# Patient Record
Sex: Female | Born: 1945 | Race: White | Hispanic: No
Health system: Southern US, Community
[De-identification: ages and names within clinical notes are randomized; demographics above are authoritative.]

## PROBLEM LIST (undated history)

## (undated) DIAGNOSIS — F419 Anxiety disorder, unspecified: Secondary | ICD-10-CM

## (undated) DIAGNOSIS — I10 Essential (primary) hypertension: Secondary | ICD-10-CM

## (undated) DIAGNOSIS — M359 Systemic involvement of connective tissue, unspecified: Secondary | ICD-10-CM

## (undated) DIAGNOSIS — M545 Low back pain, unspecified: Secondary | ICD-10-CM

## (undated) DIAGNOSIS — I219 Acute myocardial infarction, unspecified: Secondary | ICD-10-CM

## (undated) DIAGNOSIS — M549 Dorsalgia, unspecified: Secondary | ICD-10-CM

## (undated) DIAGNOSIS — I639 Cerebral infarction, unspecified: Secondary | ICD-10-CM

## (undated) DIAGNOSIS — E119 Type 2 diabetes mellitus without complications: Secondary | ICD-10-CM

## (undated) DIAGNOSIS — I2510 Atherosclerotic heart disease of native coronary artery without angina pectoris: Secondary | ICD-10-CM

## (undated) DIAGNOSIS — M869 Osteomyelitis, unspecified: Secondary | ICD-10-CM

## (undated) DIAGNOSIS — G8929 Other chronic pain: Secondary | ICD-10-CM

## (undated) HISTORY — PX: TOE AMPUTATION: SHX809

## (undated) HISTORY — PX: JOINT REPLACEMENT: SHX530

---

## 2012-05-18 DIAGNOSIS — L02619 Cutaneous abscess of unspecified foot: Secondary | ICD-10-CM | POA: Insufficient documentation

## 2014-01-17 DIAGNOSIS — K625 Hemorrhage of anus and rectum: Secondary | ICD-10-CM | POA: Insufficient documentation

## 2014-05-07 DIAGNOSIS — E1140 Type 2 diabetes mellitus with diabetic neuropathy, unspecified: Secondary | ICD-10-CM | POA: Insufficient documentation

## 2014-05-18 DIAGNOSIS — G894 Chronic pain syndrome: Secondary | ICD-10-CM | POA: Insufficient documentation

## 2014-05-18 DIAGNOSIS — R634 Abnormal weight loss: Secondary | ICD-10-CM | POA: Insufficient documentation

## 2014-05-18 DIAGNOSIS — K5120 Ulcerative (chronic) proctitis without complications: Secondary | ICD-10-CM | POA: Insufficient documentation

## 2014-05-18 DIAGNOSIS — E1142 Type 2 diabetes mellitus with diabetic polyneuropathy: Secondary | ICD-10-CM | POA: Insufficient documentation

## 2014-07-19 DIAGNOSIS — M50321 Other cervical disc degeneration at C4-C5 level: Secondary | ICD-10-CM | POA: Insufficient documentation

## 2014-12-07 DIAGNOSIS — I252 Old myocardial infarction: Secondary | ICD-10-CM | POA: Insufficient documentation

## 2014-12-15 ENCOUNTER — Emergency Department: Payer: Self-pay | Admitting: Emergency Medicine

## 2014-12-15 LAB — URINALYSIS, COMPLETE
BILIRUBIN, UR: NEGATIVE
Bacteria: NONE SEEN
Blood: NEGATIVE
GLUCOSE, UR: NEGATIVE mg/dL (ref 0–75)
KETONE: NEGATIVE
NITRITE: NEGATIVE
PROTEIN: NEGATIVE
Ph: 5 (ref 4.5–8.0)
RBC,UR: NONE SEEN /HPF (ref 0–5)
Specific Gravity: 1.014 (ref 1.003–1.030)
WBC UR: 3 /HPF (ref 0–5)

## 2014-12-15 LAB — BASIC METABOLIC PANEL
Anion Gap: 7 (ref 7–16)
BUN: 19 mg/dL — ABNORMAL HIGH (ref 7–18)
CALCIUM: 8.4 mg/dL — AB (ref 8.5–10.1)
CHLORIDE: 109 mmol/L — AB (ref 98–107)
CREATININE: 0.67 mg/dL (ref 0.60–1.30)
Co2: 26 mmol/L (ref 21–32)
EGFR (Non-African Amer.): >60
Glucose: 181 mg/dL — ABNORMAL HIGH (ref 65–99)
Osmolality: 290 (ref 275–301)
Potassium: 4 mmol/L (ref 3.5–5.1)
Sodium: 142 mmol/L (ref 136–145)

## 2014-12-15 LAB — CBC
HCT: 37 % (ref 35.0–47.0)
HGB: 11.8 g/dL — AB (ref 12.0–16.0)
MCH: 28.9 pg (ref 26.0–34.0)
MCHC: 32 g/dL (ref 32.0–36.0)
MCV: 91 fL (ref 80–100)
Platelet: 269 10*3/uL (ref 150–440)
RBC: 4.09 10*6/uL (ref 3.80–5.20)
RDW: 17 % — ABNORMAL HIGH (ref 11.5–14.5)
WBC: 6.4 10*3/uL (ref 3.6–11.0)

## 2014-12-15 LAB — TROPONIN I: Troponin-I: <0.02 ng/mL

## 2015-06-04 DIAGNOSIS — Z9889 Other specified postprocedural states: Secondary | ICD-10-CM | POA: Insufficient documentation

## 2015-06-14 DIAGNOSIS — M5440 Lumbago with sciatica, unspecified side: Secondary | ICD-10-CM | POA: Insufficient documentation

## 2015-06-14 DIAGNOSIS — F411 Generalized anxiety disorder: Secondary | ICD-10-CM | POA: Insufficient documentation

## 2015-06-14 DIAGNOSIS — F331 Major depressive disorder, recurrent, moderate: Secondary | ICD-10-CM | POA: Insufficient documentation

## 2015-06-14 DIAGNOSIS — D500 Iron deficiency anemia secondary to blood loss (chronic): Secondary | ICD-10-CM | POA: Insufficient documentation

## 2015-10-02 ENCOUNTER — Ambulatory Visit: Payer: Medicare Other

## 2015-10-14 ENCOUNTER — Encounter: Payer: Medicare Other | Admitting: Pharmacist

## 2015-11-07 ENCOUNTER — Inpatient Hospital Stay
Admission: AD | Admit: 2015-11-07 | Discharge: 2015-11-12 | DRG: 617 | Disposition: A | Discharge status: Skilled Nursing Facility | Payer: Medicare Other | Source: Ambulatory Visit | Attending: Internal Medicine | Admitting: Internal Medicine

## 2015-11-07 ENCOUNTER — Encounter: Payer: Self-pay | Admitting: Internal Medicine

## 2015-11-07 ENCOUNTER — Inpatient Hospital Stay: Payer: Medicare Other

## 2015-11-07 DIAGNOSIS — Z794 Long term (current) use of insulin: Secondary | ICD-10-CM | POA: Diagnosis not present

## 2015-11-07 DIAGNOSIS — I10 Essential (primary) hypertension: Secondary | ICD-10-CM | POA: Diagnosis present

## 2015-11-07 DIAGNOSIS — B9562 Methicillin resistant Staphylococcus aureus infection as the cause of diseases classified elsewhere: Secondary | ICD-10-CM | POA: Diagnosis present

## 2015-11-07 DIAGNOSIS — F329 Major depressive disorder, single episode, unspecified: Secondary | ICD-10-CM | POA: Diagnosis present

## 2015-11-07 DIAGNOSIS — E1140 Type 2 diabetes mellitus with diabetic neuropathy, unspecified: Secondary | ICD-10-CM | POA: Diagnosis present

## 2015-11-07 DIAGNOSIS — Z8673 Personal history of transient ischemic attack (TIA), and cerebral infarction without residual deficits: Secondary | ICD-10-CM

## 2015-11-07 DIAGNOSIS — M549 Dorsalgia, unspecified: Secondary | ICD-10-CM

## 2015-11-07 DIAGNOSIS — K219 Gastro-esophageal reflux disease without esophagitis: Secondary | ICD-10-CM | POA: Diagnosis present

## 2015-11-07 DIAGNOSIS — L97529 Non-pressure chronic ulcer of other part of left foot with unspecified severity: Secondary | ICD-10-CM | POA: Diagnosis present

## 2015-11-07 DIAGNOSIS — I25119 Atherosclerotic heart disease of native coronary artery with unspecified angina pectoris: Secondary | ICD-10-CM | POA: Diagnosis present

## 2015-11-07 DIAGNOSIS — Z885 Allergy status to narcotic agent status: Secondary | ICD-10-CM | POA: Diagnosis not present

## 2015-11-07 DIAGNOSIS — Z66 Do not resuscitate: Secondary | ICD-10-CM | POA: Diagnosis present

## 2015-11-07 DIAGNOSIS — Z7982 Long term (current) use of aspirin: Secondary | ICD-10-CM | POA: Diagnosis not present

## 2015-11-07 DIAGNOSIS — Z887 Allergy status to serum and vaccine status: Secondary | ICD-10-CM | POA: Diagnosis not present

## 2015-11-07 DIAGNOSIS — M869 Osteomyelitis, unspecified: Secondary | ICD-10-CM | POA: Diagnosis present

## 2015-11-07 DIAGNOSIS — I2089 Other forms of angina pectoris: Secondary | ICD-10-CM

## 2015-11-07 DIAGNOSIS — I208 Other forms of angina pectoris: Secondary | ICD-10-CM

## 2015-11-07 DIAGNOSIS — I252 Old myocardial infarction: Secondary | ICD-10-CM

## 2015-11-07 DIAGNOSIS — M868X7 Other osteomyelitis, ankle and foot: Secondary | ICD-10-CM | POA: Diagnosis present

## 2015-11-07 DIAGNOSIS — E1169 Type 2 diabetes mellitus with other specified complication: Secondary | ICD-10-CM | POA: Diagnosis present

## 2015-11-07 DIAGNOSIS — Z79899 Other long term (current) drug therapy: Secondary | ICD-10-CM | POA: Diagnosis not present

## 2015-11-07 DIAGNOSIS — G8929 Other chronic pain: Secondary | ICD-10-CM | POA: Diagnosis present

## 2015-11-07 DIAGNOSIS — Z888 Allergy status to other drugs, medicaments and biological substances status: Secondary | ICD-10-CM

## 2015-11-07 DIAGNOSIS — E785 Hyperlipidemia, unspecified: Secondary | ICD-10-CM | POA: Diagnosis present

## 2015-11-07 DIAGNOSIS — M545 Low back pain: Secondary | ICD-10-CM | POA: Diagnosis present

## 2015-11-07 HISTORY — DX: Low back pain, unspecified: M54.50

## 2015-11-07 HISTORY — DX: Essential (primary) hypertension: I10

## 2015-11-07 HISTORY — DX: Cerebral infarction, unspecified: I63.9

## 2015-11-07 HISTORY — DX: Other chronic pain: G89.29

## 2015-11-07 HISTORY — DX: Type 2 diabetes mellitus without complications: E11.9

## 2015-11-07 HISTORY — DX: Atherosclerotic heart disease of native coronary artery without angina pectoris: I25.10

## 2015-11-07 HISTORY — DX: Acute myocardial infarction, unspecified: I21.9

## 2015-11-07 HISTORY — DX: Dorsalgia, unspecified: M54.9

## 2015-11-07 HISTORY — DX: Low back pain: M54.5

## 2015-11-07 LAB — CBC
HCT: 37 % (ref 35.0–47.0)
Hemoglobin: 12.3 g/dL (ref 12.0–16.0)
MCH: 31.2 pg (ref 26.0–34.0)
MCHC: 33.4 g/dL (ref 32.0–36.0)
MCV: 93.5 fL (ref 80.0–100.0)
PLATELETS: 284 10*3/uL (ref 150–440)
RBC: 3.96 MIL/uL (ref 3.80–5.20)
RDW: 13.5 % (ref 11.5–14.5)
WBC: 6.7 10*3/uL (ref 3.6–11.0)

## 2015-11-07 LAB — GLUCOSE, CAPILLARY
GLUCOSE-CAPILLARY: 153 mg/dL — AB (ref 65–99)
Glucose-Capillary: 92 mg/dL (ref 65–99)

## 2015-11-07 LAB — COMPREHENSIVE METABOLIC PANEL
ALBUMIN: 3.9 g/dL (ref 3.5–5.0)
ALT: 19 U/L (ref 14–54)
ANION GAP: 6 (ref 5–15)
AST: 19 U/L (ref 15–41)
Alkaline Phosphatase: 97 U/L (ref 38–126)
BILIRUBIN TOTAL: 0.5 mg/dL (ref 0.3–1.2)
BUN: 22 mg/dL — ABNORMAL HIGH (ref 6–20)
CALCIUM: 9.5 mg/dL (ref 8.9–10.3)
CO2: 30 mmol/L (ref 22–32)
CREATININE: 0.68 mg/dL (ref 0.44–1.00)
Chloride: 102 mmol/L (ref 101–111)
GFR calc non Af Amer: >60 mL/min (ref >60)
GLUCOSE: 115 mg/dL — AB (ref 65–99)
Potassium: 4.2 mmol/L (ref 3.5–5.1)
SODIUM: 138 mmol/L (ref 135–145)
TOTAL PROTEIN: 7 g/dL (ref 6.5–8.1)

## 2015-11-07 LAB — SURGICAL PCR SCREEN
MRSA, PCR: POSITIVE — AB
Staphylococcus aureus: POSITIVE — AB

## 2015-11-07 MED ORDER — TIZANIDINE HCL 4 MG PO TABS
4.0000 mg | ORAL_TABLET | Freq: Three times a day (TID) | ORAL | Status: DC
Start: 2015-11-07 — End: 2015-11-12
  Administered 2015-11-07 – 2015-11-12 (×12): 4 mg via ORAL
  Filled 2015-11-07 (×12): qty 1

## 2015-11-07 MED ORDER — TRAZODONE HCL 100 MG PO TABS
100.0000 mg | ORAL_TABLET | Freq: Every evening | ORAL | Status: DC | PRN
  Administered 2015-11-07 – 2015-11-10 (×4): 100 mg via ORAL
  Filled 2015-11-07 (×4): qty 1

## 2015-11-07 MED ORDER — ISOSORBIDE MONONITRATE ER 30 MG PO TB24
30.0000 mg | ORAL_TABLET | Freq: Every day | ORAL | Status: DC
  Administered 2015-11-08 – 2015-11-12 (×4): 30 mg via ORAL
  Filled 2015-11-07 (×4): qty 1

## 2015-11-07 MED ORDER — MIRTAZAPINE 15 MG PO TABS
15.0000 mg | ORAL_TABLET | Freq: Every day | ORAL | Status: DC
  Administered 2015-11-07 – 2015-11-11 (×5): 15 mg via ORAL
  Filled 2015-11-07 (×5): qty 1

## 2015-11-07 MED ORDER — FESOTERODINE FUMARATE ER 4 MG PO TB24
4.0000 mg | ORAL_TABLET | Freq: Every day | ORAL | Status: DC
  Administered 2015-11-08 – 2015-11-12 (×5): 4 mg via ORAL
  Filled 2015-11-07 (×5): qty 1

## 2015-11-07 MED ORDER — FUROSEMIDE 20 MG PO TABS
20.0000 mg | ORAL_TABLET | Freq: Every day | ORAL | Status: DC
  Administered 2015-11-08 – 2015-11-12 (×4): 20 mg via ORAL
  Filled 2015-11-07 (×4): qty 1

## 2015-11-07 MED ORDER — INSULIN GLARGINE 100 UNIT/ML ~~LOC~~ SOLN
20.0000 [IU] | Freq: Every day | SUBCUTANEOUS | Status: DC
  Administered 2015-11-07 – 2015-11-11 (×5): 20 [IU] via SUBCUTANEOUS
  Filled 2015-11-07 (×6): qty 0.2

## 2015-11-07 MED ORDER — PANTOPRAZOLE SODIUM 40 MG PO TBEC
40.0000 mg | DELAYED_RELEASE_TABLET | Freq: Every day | ORAL | Status: DC
  Administered 2015-11-07 – 2015-11-12 (×5): 40 mg via ORAL
  Filled 2015-11-07 (×5): qty 1

## 2015-11-07 MED ORDER — ALPRAZOLAM 0.5 MG PO TABS
1.0000 mg | ORAL_TABLET | Freq: Three times a day (TID) | ORAL | Status: DC | PRN
Start: 2015-11-07 — End: 2015-11-12
  Administered 2015-11-08 – 2015-11-11 (×5): 1 mg via ORAL
  Administered 2015-11-11: 0.5 mg via ORAL
  Administered 2015-11-12 (×2): 1 mg via ORAL
  Filled 2015-11-07 (×3): qty 2
  Filled 2015-11-07: qty 1
  Filled 2015-11-07 (×5): qty 2

## 2015-11-07 MED ORDER — NITROGLYCERIN 0.4 MG SL SUBL: 0.4000 mg | SUBLINGUAL_TABLET | SUBLINGUAL | Status: DC | PRN

## 2015-11-07 MED ORDER — INSULIN ASPART 100 UNIT/ML ~~LOC~~ SOLN
0.0000 [IU] | Freq: Three times a day (TID) | SUBCUTANEOUS | Status: DC
  Administered 2015-11-08: 1 [IU] via SUBCUTANEOUS
  Administered 2015-11-09: 2 [IU] via SUBCUTANEOUS
  Administered 2015-11-11: 1 [IU] via SUBCUTANEOUS
  Filled 2015-11-07 (×2): qty 1
  Filled 2015-11-07: qty 2

## 2015-11-07 MED ORDER — MORPHINE SULFATE 15 MG PO TABS
15.0000 mg | ORAL_TABLET | Freq: Four times a day (QID) | ORAL | Status: DC
  Administered 2015-11-07 – 2015-11-12 (×19): 15 mg via ORAL
  Filled 2015-11-07 (×20): qty 1

## 2015-11-07 MED ORDER — ATORVASTATIN CALCIUM 20 MG PO TABS
20.0000 mg | ORAL_TABLET | Freq: Every day | ORAL | Status: DC
  Administered 2015-11-07 – 2015-11-12 (×6): 20 mg via ORAL
  Filled 2015-11-07 (×6): qty 1

## 2015-11-07 MED ORDER — HEPARIN SODIUM (PORCINE) 5000 UNIT/ML IJ SOLN
5000.0000 [IU] | Freq: Three times a day (TID) | INTRAMUSCULAR | Status: DC
  Administered 2015-11-07 – 2015-11-09 (×5): 5000 [IU] via SUBCUTANEOUS
  Filled 2015-11-07 (×5): qty 1

## 2015-11-07 MED ORDER — MORPHINE SULFATE ER 30 MG PO TBCR
30.0000 mg | EXTENDED_RELEASE_TABLET | Freq: Three times a day (TID) | ORAL | Status: DC
  Administered 2015-11-07 – 2015-11-12 (×15): 30 mg via ORAL
  Filled 2015-11-07 (×15): qty 1

## 2015-11-07 MED ORDER — DIPHENOXYLATE-ATROPINE 2.5-0.025 MG PO TABS: 1.0000 | ORAL_TABLET | Freq: Four times a day (QID) | ORAL | Status: DC | PRN

## 2015-11-07 MED ORDER — METOPROLOL TARTRATE 50 MG PO TABS
50.0000 mg | ORAL_TABLET | Freq: Two times a day (BID) | ORAL | Status: DC
  Administered 2015-11-07 – 2015-11-11 (×7): 50 mg via ORAL
  Filled 2015-11-07 (×7): qty 1

## 2015-11-07 MED ORDER — SODIUM CHLORIDE 0.9 % IV SOLN
1.5000 g | Freq: Three times a day (TID) | INTRAVENOUS | Status: DC
  Administered 2015-11-07 – 2015-11-08 (×3): 1.5 g via INTRAVENOUS
  Filled 2015-11-07 (×5): qty 1.5

## 2015-11-07 MED ORDER — FERROUS SULFATE 325 (65 FE) MG PO TABS
325.0000 mg | ORAL_TABLET | Freq: Every day | ORAL | Status: DC
  Administered 2015-11-08 – 2015-11-12 (×4): 325 mg via ORAL
  Filled 2015-11-07 (×4): qty 1

## 2015-11-07 MED ORDER — LISINOPRIL 20 MG PO TABS
20.0000 mg | ORAL_TABLET | Freq: Every day | ORAL | Status: DC
  Administered 2015-11-08: 20 mg via ORAL
  Filled 2015-11-07 (×2): qty 1

## 2015-11-07 MED ORDER — GABAPENTIN 100 MG PO CAPS
100.0000 mg | ORAL_CAPSULE | Freq: Two times a day (BID) | ORAL | Status: DC
  Administered 2015-11-07 – 2015-11-12 (×10): 100 mg via ORAL
  Filled 2015-11-07 (×10): qty 1

## 2015-11-07 MED ORDER — AMLODIPINE BESYLATE 5 MG PO TABS
5.0000 mg | ORAL_TABLET | Freq: Every day | ORAL | Status: DC
  Administered 2015-11-08: 5 mg via ORAL
  Filled 2015-11-07 (×2): qty 1

## 2015-11-07 MED ORDER — VENLAFAXINE HCL ER 75 MG PO CP24
150.0000 mg | ORAL_CAPSULE | Freq: Every day | ORAL | Status: DC
  Administered 2015-11-08 – 2015-11-12 (×5): 150 mg via ORAL
  Filled 2015-11-07 (×5): qty 2

## 2015-11-07 MED ORDER — OXYCODONE-ACETAMINOPHEN 5-325 MG PO TABS
1.0000 | ORAL_TABLET | ORAL | Status: DC | PRN
  Administered 2015-11-11: 1 via ORAL
  Filled 2015-11-07 (×3): qty 1

## 2015-11-07 MED ORDER — PREGABALIN 50 MG PO CAPS
50.0000 mg | ORAL_CAPSULE | Freq: Two times a day (BID) | ORAL | Status: DC
  Administered 2015-11-07 – 2015-11-12 (×10): 50 mg via ORAL
  Filled 2015-11-07 (×10): qty 1

## 2015-11-07 NOTE — Progress Notes (Signed)
Patient admitted to 145 this afternoon. Dr Alberteen Spindleline will be doing surgery 11/19 or 11/20.  Pt said that the dr mentioned doing a CT of her head.  I asked her why and she said she felt like she may have had a small stroke.  Dr Elisabeth PigeonVachhani did not want to order a head CT

## 2015-11-07 NOTE — H&P (Signed)
[facility]   PATIENT NAME: Jessica Donovan Dykman    MR#:  [id number]  DATE OF BIRTH:  1946/11/19  DATE OF ADMISSION:  11/07/2015  PRIMARY CARE PHYSICIAN: Geoffery Lyonsurner, Eric M., PA   REQUESTING/REFERRING PHYSICIAN: Dr, Marikay AlarKlien- Podiatrist.  CHIEF COMPLAINT:  No chief complaint on file.   HISTORY OF PRESENT ILLNESS: Jessica Donovan Fecher  is a 69 y.o. female with a known history of hypertension, diabetes, coronary artery disease, stroke, chronic back pain- has been having redness and swelling with some pain on her left side third toe, she has been trying to do dressing him take some pain medications at home on her own but it was not resolving. Finally yesterday C spoke to her primary care doctor Dareen Pianonderson about that, he referred her to podiatry Dr. Odessa FlemingKlein's office. Today patient went over there, and Dr. Alberteen Spindleline suspected she has osteomyelitis and she need amputation of the toe but because of her cardiac history and being on some antiplatelet agents she needed to be off from those and started on antibiotics in hospital- so sent her for direct admission.  On further questioning patient also told me in hospital that for last few weeks to month she has been having chest pain on and off in center of her chest with even minimal activity like getting up to change the radio station or to pick up something in the house, but she does not have the pain when she is just lying down or sitting. She also went to Dr. Darrold JunkerParaschos office yesterday- and he told her we will do a cardiac catheterization as outpatient within next few days so he was working on that.  PAST MEDICAL HISTORY:   Past Medical History  Diagnosis Date  . Hypertension   . Diabetes mellitus without complication (HCC)   . Coronary artery disease   . Low back pain   . Stroke (HCC)   . Myocardial infarction (HCC)   . Chronic back pain     PAST SURGICAL HISTORY:  Past Surgical History  Procedure Laterality Date  .  Toe amputation      SOCIAL HISTORY:  Social History  Substance Use Topics  . Smoking status: Never Smoker   . Smokeless tobacco: Not on file  . Alcohol Use: Not on file    FAMILY HISTORY:  Family History  Problem Relation Age of Onset  . CAD Mother   . CAD Father     DRUG ALLERGIES:  Allergies  Allergen Reactions  . Codeine Nausea And Vomiting  . Influenza Vaccines Other (See Comments)    Passed out  . Methadone Hives  . Percocet [Oxycodone-Acetaminophen] Nausea And Vomiting  . Tetanus Toxoids Swelling  . Tetracyclines & Related Rash    REVIEW OF SYSTEMS:   CONSTITUTIONAL: No fever, fatigue or weakness.  EYES: No blurred or double vision.  EARS, NOSE, AND THROAT: No tinnitus or ear pain.  RESPIRATORY: No cough, shortness of breath, wheezing or hemoptysis.  CARDIOVASCULAR: Positive for anginal chest pain, no orthopnea, edema.  GASTROINTESTINAL: No nausea, vomiting, diarrhea or abdominal pain.  GENITOURINARY: No dysuria, hematuria.  ENDOCRINE: No polyuria, nocturia,  HEMATOLOGY: No anemia, easy bruising or bleeding SKIN: No rash or lesion. MUSCULOSKELETAL: No joint pain or arthritis. On left third toe redness and some foul smelling and severe pain present   NEUROLOGIC: No tingling, numbness, weakness.  PSYCHIATRY: No anxiety or depression.   MEDICATIONS AT HOME:  Prior to Admission medications   Medication Sig Start Date  End Date Taking? Authorizing Provider  ALPRAZolam Prudy Feeler) 1 MG tablet Take 1 mg by mouth 3 (three) times daily as needed for anxiety.   Yes Historical Provider, MD  amLODipine (NORVASC) 5 MG tablet Take 5 mg by mouth.   Yes Historical Provider, MD  amoxicillin-clavulanate (AUGMENTIN) 875-125 MG tablet Take 1 tablet by mouth 2 (two) times daily.   Yes Historical Provider, MD  aspirin EC 81 MG tablet Take 81 mg by mouth daily.   Yes Historical Provider, MD  atorvastatin (LIPITOR) 20 MG tablet Take 20 mg by mouth daily.   Yes Historical Provider, MD   diphenoxylate-atropine (LOMOTIL) 2.5-0.025 MG tablet Take 1 tablet by mouth 4 (four) times daily as needed for diarrhea or loose stools.   Yes Historical Provider, MD  ergocalciferol (VITAMIN D2) 50000 UNITS capsule Take 50,000 Units by mouth once a week. 11/10/15  Yes Historical Provider, MD  ferrous sulfate 325 (65 FE) MG tablet Take 325 mg by mouth daily with breakfast.   Yes Historical Provider, MD  furosemide (LASIX) 20 MG tablet Take 20 mg by mouth daily.   Yes Historical Provider, MD  gabapentin (NEURONTIN) 100 MG capsule Take 100 mg by mouth.   Yes Historical Provider, MD  insulin aspart protamine- aspart (NOVOLOG MIX 70/30) (70-30) 100 UNIT/ML injection Inject into the skin 2 (two) times daily with a meal.   Yes Historical Provider, MD  insulin glargine (LANTUS) 100 UNIT/ML injection Inject 50 Units into the skin at bedtime.   Yes Historical Provider, MD  isosorbide mononitrate (IMDUR) 30 MG 24 hr tablet Take 30 mg by mouth daily.   Yes Historical Provider, MD  liothyronine (CYTOMEL) 25 MCG tablet Take 25 mcg by mouth daily.   Yes Historical Provider, MD  lisinopril (PRINIVIL,ZESTRIL) 20 MG tablet Take 20 mg by mouth daily.   Yes Historical Provider, MD  metoprolol (LOPRESSOR) 50 MG tablet Take 50 mg by mouth 2 (two) times daily.   Yes Historical Provider, MD  mirtazapine (REMERON) 15 MG tablet Take 15 mg by mouth at bedtime.   Yes Historical Provider, MD  morphine (MS CONTIN) 30 MG 12 hr tablet Take 30 mg by mouth 3 (three) times daily.   Yes Historical Provider, MD  morphine (MSIR) 15 MG tablet Take 15 mg by mouth 4 (four) times daily.   Yes Historical Provider, MD  nitrofurantoin (MACRODANTIN) 100 MG capsule Take 100 mg by mouth 2 (two) times daily.   Yes Historical Provider, MD  nitroGLYCERIN (NITROSTAT) 0.4 MG SL tablet Place 0.4 mg under the tongue every 5 (five) minutes as needed for chest pain.   Yes Historical Provider, MD  omeprazole (PRILOSEC) 20 MG capsule Take 20 mg by mouth  daily.   Yes Historical Provider, MD  pregabalin (LYRICA) 75 MG capsule Take 75 mg by mouth 2 (two) times daily.   Yes Historical Provider, MD  solifenacin (VESICARE) 10 MG tablet Take by mouth daily.   Yes Historical Provider, MD  ticagrelor (BRILINTA) 90 MG TABS tablet Take 90 mg by mouth 2 (two) times daily.   Yes Historical Provider, MD  tiZANidine (ZANAFLEX) 4 MG tablet Take 4 mg by mouth 3 (three) times daily.   Yes Historical Provider, MD  tolterodine (DETROL LA) 4 MG 24 hr capsule Take 4 mg by mouth daily.   Yes Historical Provider, MD  traZODone (DESYREL) 100 MG tablet Take 100 mg by mouth at bedtime as needed for sleep.   Yes Historical Provider, MD  venlafaxine XR (EFFEXOR-XR) 150  MG 24 hr capsule Take 150 mg by mouth daily with breakfast.   Yes Historical Provider, MD      PHYSICAL EXAMINATION:   VITAL SIGNS: Blood pressure 119/56, pulse 65, temperature 97.8 F (36.6 C), temperature source Oral, resp. rate 20, height 5\' 3"  (1.6 m), weight 79.606 kg (175 lb 8 oz), SpO2 98 %.  GENERAL:  69 y.o.-year-old patient lying in the bed with no acute distress.  EYES: Pupils equal, round, reactive to light and accommodation. No scleral icterus. Extraocular muscles intact.  HEENT: Head atraumatic, normocephalic. Oropharynx and nasopharynx clear.  NECK:  Supple, no jugular venous distention. No thyroid enlargement, no tenderness.  LUNGS: Normal breath sounds bilaterally, no wheezing, rales,rhonchi or crepitation. No use of accessory muscles of respiration.  CARDIOVASCULAR: S1, S2 normal. No murmurs, rubs, or gallops.  ABDOMEN: Soft, nontender, nondistended. Bowel sounds present. No organomegaly or mass.  EXTREMITIES: No pedal edema, cyanosis, or clubbing. Left foot third toe is very red and extremely tender to touch and the redness is extending on the dorsal aspect of left foot, pulses are good and palpable on both feet.  NEUROLOGIC: Cranial nerves II through XII are intact. Muscle strength 5/5  in all extremities. Sensation intact. Gait not checked.  PSYCHIATRIC: The patient is alert and oriented x 3.  SKIN: No obvious rash, lesion, or ulcer.   LABORATORY PANEL:   CBC  Recent Labs Lab 11/07/15 1510  WBC 6.7  HGB 12.3  HCT 37.0  PLT 284  MCV 93.5  MCH 31.2  MCHC 33.4  RDW 13.5   ------------------------------------------------------------------------------------------------------------------  Chemistries   Recent Labs Lab 11/07/15 1510  NA 138  K 4.2  CL 102  CO2 30  GLUCOSE 115*  BUN 22*  CREATININE 0.68  CALCIUM 9.5  AST 19  ALT 19  ALKPHOS 97  BILITOT 0.5   ------------------------------------------------------------------------------------------------------------------ estimated creatinine clearance is 66.3 mL/min (by C-G formula based on Cr of 0.68). ------------------------------------------------------------------------------------------------------------------ No results for input(s): TSH, T4TOTAL, T3FREE, THYROIDAB in the last 72 hours.  Invalid input(s): FREET3   Coagulation profile No results for input(s): INR, PROTIME in the last 168 hours. ------------------------------------------------------------------------------------------------------------------- No results for input(s): DDIMER in the last 72 hours. -------------------------------------------------------------------------------------------------------------------  Cardiac Enzymes No results for input(s): CKMB, TROPONINI, MYOGLOBIN in the last 168 hours.  Invalid input(s): CK ------------------------------------------------------------------------------------------------------------------ Invalid input(s): POCBNP  ---------------------------------------------------------------------------------------------------------------  Urinalysis    Component Value Date/Time   COLORURINE Yellow 12/15/2014 0958   APPEARANCEUR Hazy 12/15/2014 0958   LABSPEC 1.014 12/15/2014 0958    PHURINE 5.0 12/15/2014 0958   GLUCOSEU Negative 12/15/2014 0958   HGBUR Negative 12/15/2014 0958   BILIRUBINUR Negative 12/15/2014 0958   KETONESUR Negative 12/15/2014 0958   PROTEINUR Negative 12/15/2014 0958   NITRITE Negative 12/15/2014 0958   LEUKOCYTESUR 1+ 12/15/2014 0958     RADIOLOGY: Dg Foot 2 Views Left  11/07/2015  CLINICAL DATA:  Chronic left foot pain x3 months, 3rd tilt is red and swollen EXAM: LEFT FOOT - 2 VIEW COMPARISON:  None. FINDINGS: Suspected cortical irregularity/destructive changes involving the 3rd distal phalanx with overlying soft tissue swelling, worrisome for acute osteomyelitis. Moderate degenerative changes involving the 1st IP joint. Large plantar and posterior calcaneal enthesophytes. IMPRESSION: Cortical irregularity/destructive changes involving the 3rd distal phalanx, worrisome for acute osteomyelitis. Electronically Signed   By: Charline Bills M.D.   On: 11/07/2015 15:53    EKG: Orders placed or performed in visit on 12/15/14  . EKG 12-Lead    IMPRESSION AND PLAN:  * Osteomyelitis  of  left third toe.  We'll start on IV antibiotic empirically at this time.  As patient is on aspirin and the Lantana it will be for a few days before she can have amputation.  Call podiatry consult.  * Coronary artery disease with recurrent angina  She just visited her cardiologist yesterday for her angina and he was planning to schedule cardiac catheterization next week as outpatient.  I will call cardiology consult to help manage this issue and to coordinate with podiatry when to do amputation procedure, as she would need to be off from anticoagulation for the procedure.  * Diabetes  She is on very high-dose of insulin at home  I will give small dose of Lantus and insulin sliding scale coverage and we will help to change it if her sugar stays very high..  * Hypertension  Continue home medication  * Chronic pain  She is on long-acting and immediate release  morphine at home, will continue the same dose here in hospital.    All the records are reviewed and case discussed with ED provider. Management plans discussed with the patient, family and they are in agreement.  CODE STATUS:    Code Status Orders        Start     Ordered   11/07/15 1733  Do not attempt resuscitation (DNR)   Continuous    Question Answer Comment  In the event of cardiac or respiratory ARREST Do not call a "code blue"   In the event of cardiac or respiratory ARREST Do not perform Intubation, CPR, defibrillation or ACLS   In the event of cardiac or respiratory ARREST Use medication by any route, position, wound care, and other measures to relive pain and suffering. May use oxygen, suction and manual treatment of airway obstruction as needed for comfort.      11/07/15 1732    Advance Directive Documentation        Most Recent Value   Type of Advance Directive  Living will   Pre-existing out of facility DNR order (yellow form or pink MOST form)     "MOST" Form in Place?         TOTAL TIME TAKING CARE OF THIS PATIENT: 50 minutes.    Altamese Dilling M.D on 11/07/2015   Between 7am to 6pm - Pager - [phone]  After 6pm go to [email or web address] - password [email or web address] [facility] [address]  [facility]  Office  [phone]  CC: Primary care physician; Geoffery Lyons., PA   Note: This dictation was prepared with Dragon dictation along with smaller phrase technology. Any transcriptional errors that result from this process are unintentional.

## 2015-11-08 LAB — GLUCOSE, CAPILLARY
GLUCOSE-CAPILLARY: 89 mg/dL (ref 65–99)
Glucose-Capillary: 96 mg/dL (ref 65–99)
Glucose-Capillary: 138 mg/dL — ABNORMAL HIGH (ref 65–99)
Glucose-Capillary: 129 mg/dL — ABNORMAL HIGH (ref 65–99)

## 2015-11-08 LAB — BASIC METABOLIC PANEL
Anion gap: 6 (ref 5–15)
BUN: 18 mg/dL (ref 6–20)
CALCIUM: 9 mg/dL (ref 8.9–10.3)
CO2: 30 mmol/L (ref 22–32)
CREATININE: 0.73 mg/dL (ref 0.44–1.00)
Chloride: 104 mmol/L (ref 101–111)
GFR calc Af Amer: >60 mL/min (ref >60)
GLUCOSE: 97 mg/dL (ref 65–99)
Potassium: 4 mmol/L (ref 3.5–5.1)
Sodium: 140 mmol/L (ref 135–145)

## 2015-11-08 LAB — CBC
HCT: 34.5 % — ABNORMAL LOW (ref 35.0–47.0)
Hemoglobin: 11.4 g/dL — ABNORMAL LOW (ref 12.0–16.0)
MCH: 31.4 pg (ref 26.0–34.0)
MCHC: 33.2 g/dL (ref 32.0–36.0)
MCV: 94.5 fL (ref 80.0–100.0)
PLATELETS: 261 10*3/uL (ref 150–440)
RBC: 3.65 MIL/uL — ABNORMAL LOW (ref 3.80–5.20)
RDW: 13.6 % (ref 11.5–14.5)
WBC: 6.9 10*3/uL (ref 3.6–11.0)

## 2015-11-08 MED ORDER — VANCOMYCIN HCL 10 G IV SOLR
1250.0000 mg | Freq: Once | INTRAVENOUS | Status: AC
  Administered 2015-11-08: 1250 mg via INTRAVENOUS
  Filled 2015-11-08: qty 1250

## 2015-11-08 MED ORDER — VANCOMYCIN HCL IN DEXTROSE 750-5 MG/150ML-% IV SOLN
750.0000 mg | Freq: Two times a day (BID) | INTRAVENOUS | Status: DC
  Administered 2015-11-09 – 2015-11-10 (×4): 750 mg via INTRAVENOUS
  Filled 2015-11-08 (×4): qty 150

## 2015-11-08 MED ORDER — PIPERACILLIN-TAZOBACTAM 3.375 G IVPB
3.3750 g | Freq: Three times a day (TID) | INTRAVENOUS | Status: DC
  Administered 2015-11-08 – 2015-11-11 (×9): 3.375 g via INTRAVENOUS
  Filled 2015-11-08 (×11): qty 50

## 2015-11-08 NOTE — Consult Note (Signed)
Reason for Consult: Osteomyelitis of the left third toe Referring Physician: [facility]  Jessica Donovan is an 69 y.o. female.  HPI: The patient relates a several week history of a sore on the tip of her left third toe. She has been caring for this at home. Recently has started to have some increased redness and drainage from the toe. Relates pain with the toe even though she does have neuropathy which is not normal for her. She was recently seen by Dr. Ouida Sills outpatient who placed her on some Augmentin. X-rays did show osteomyelitis and decision was made to admit the patient for amputation of the toe.  Past Medical History  Diagnosis Date  . Hypertension   . Diabetes mellitus without complication ([place])   . Coronary artery disease   . Low back pain   . Stroke ([place])   . Myocardial infarction ([place])   . Chronic back pain     Past Surgical History  Procedure Laterality Date  . Toe amputation      Family History  Problem Relation Age of Onset  . CAD Mother   . CAD Father     Social History:  reports that she has never smoked. She does not have any smokeless tobacco history on file. Her alcohol and drug histories are not on file.  Allergies:  Allergies  Allergen Reactions  . Codeine Nausea And Vomiting  . Influenza Vaccines Other (See Comments)    Passed out  . Methadone Hives  . Percocet [Oxycodone-Acetaminophen] Nausea And Vomiting  . Tetanus Toxoids Swelling  . Tetracyclines & Related Rash    Medications: I have reviewed the patient's current medications.  Results for orders placed or performed during the hospital encounter of 11/07/15 (from the past 48 hour(s))  CBC     Status: None   Collection Time: 11/07/15  3:10 PM  Result Value Ref Range   WBC 6.7 3.6 - 11.0 K/uL   RBC 3.96 3.80 - 5.20 MIL/uL   Hemoglobin 12.3 12.0 - 16.0 g/dL   HCT 37.0 35.0 - 47.0 %   MCV 93.5 80.0 - 100.0 fL   MCH 31.2 26.0 - 34.0 pg   MCHC 33.4 32.0 - 36.0 g/dL   RDW  13.5 11.5 - 14.5 %   Platelets 284 150 - 440 K/uL  Comprehensive metabolic panel     Status: Abnormal   Collection Time: 11/07/15  3:10 PM  Result Value Ref Range   Sodium 138 135 - 145 mmol/L   Potassium 4.2 3.5 - 5.1 mmol/L   Chloride 102 101 - 111 mmol/L   CO2 30 22 - 32 mmol/L   Glucose, Bld 115 (H) 65 - 99 mg/dL   BUN 22 (H) 6 - 20 mg/dL   Creatinine, Ser 0.68 0.44 - 1.00 mg/dL   Calcium 9.5 8.9 - 10.3 mg/dL   Total Protein 7.0 6.5 - 8.1 g/dL   Albumin 3.9 3.5 - 5.0 g/dL   AST 19 15 - 41 U/L   ALT 19 14 - 54 U/L   Alkaline Phosphatase 97 38 - 126 U/L   Total Bilirubin 0.5 0.3 - 1.2 mg/dL   GFR calc non Af Amer >60 >60 mL/min   GFR calc Af Amer >60 >60 mL/min    Comment: (NOTE) The eGFR has been calculated using the CKD EPI equation. This calculation has not been validated in all clinical situations. eGFR's persistently <60 mL/min signify possible Chronic Kidney Disease.    Anion gap 6 5 -  15  Glucose, capillary     Status: None   Collection Time: 11/07/15  3:56 PM  Result Value Ref Range   Glucose-Capillary 92 65 - 99 mg/dL   Comment 1 Notify RN   Surgical pcr screen     Status: Abnormal   Collection Time: 11/07/15  6:44 PM  Result Value Ref Range   MRSA, PCR POSITIVE (A) NEGATIVE    Comment: CRITICAL RESULT CALLED TO, READ BACK BY AND VERIFIED WITH: MATT PAGE AT 2058 11/07/15 MLZ     Staphylococcus aureus POSITIVE (A) NEGATIVE    Comment:        The Xpert SA Assay (FDA approved for NASAL specimens in patients over 4 years of age), is one component of a comprehensive surveillance program.  Test performance has been validated by [facility] for patients greater than or equal to 52 year old. It is not intended to diagnose infection nor to guide or monitor treatment.   Glucose, capillary     Status: Abnormal   Collection Time: 11/07/15  8:58 PM  Result Value Ref Range   Glucose-Capillary 153 (H) 65 - 99 mg/dL   Comment 1 Notify RN   Basic metabolic panel      Status: None   Collection Time: 11/08/15  4:47 AM  Result Value Ref Range   Sodium 140 135 - 145 mmol/L   Potassium 4.0 3.5 - 5.1 mmol/L   Chloride 104 101 - 111 mmol/L   CO2 30 22 - 32 mmol/L   Glucose, Bld 97 65 - 99 mg/dL   BUN 18 6 - 20 mg/dL   Creatinine, Ser 0.73 0.44 - 1.00 mg/dL   Calcium 9.0 8.9 - 10.3 mg/dL   GFR calc non Af Amer >60 >60 mL/min   GFR calc Af Amer >60 >60 mL/min    Comment: (NOTE) The eGFR has been calculated using the CKD EPI equation. This calculation has not been validated in all clinical situations. eGFR's persistently <60 mL/min signify possible Chronic Kidney Disease.    Anion gap 6 5 - 15  CBC     Status: Abnormal   Collection Time: 11/08/15  4:47 AM  Result Value Ref Range   WBC 6.9 3.6 - 11.0 K/uL   RBC 3.65 (L) 3.80 - 5.20 MIL/uL   Hemoglobin 11.4 (L) 12.0 - 16.0 g/dL   HCT 34.5 (L) 35.0 - 47.0 %   MCV 94.5 80.0 - 100.0 fL   MCH 31.4 26.0 - 34.0 pg   MCHC 33.2 32.0 - 36.0 g/dL   RDW 13.6 11.5 - 14.5 %   Platelets 261 150 - 440 K/uL  Glucose, capillary     Status: None   Collection Time: 11/08/15  7:43 AM  Result Value Ref Range   Glucose-Capillary 96 65 - 99 mg/dL    Dg Foot 2 Views Left  11/07/2015  CLINICAL DATA:  Chronic left foot pain x3 months, 3rd tilt is red and swollen EXAM: LEFT FOOT - 2 VIEW COMPARISON:  None. FINDINGS: Suspected cortical irregularity/destructive changes involving the 3rd distal phalanx with overlying soft tissue swelling, worrisome for acute osteomyelitis. Moderate degenerative changes involving the 1st IP joint. Large plantar and posterior calcaneal enthesophytes. IMPRESSION: Cortical irregularity/destructive changes involving the 3rd distal phalanx, worrisome for acute osteomyelitis. Electronically Signed   By: Julian Hy M.D.   On: 11/07/2015 15:53    Review of Systems  Constitutional: Negative.   HENT: Negative.   Eyes: Negative.   Respiratory: Negative.   Cardiovascular:  Negative.    Gastrointestinal: Negative.   Genitourinary: Negative.   Musculoskeletal:       Some recent pain at the tip of her left third toe. Some chronic back pain  Skin:       Some redness and swelling with her left third toe. Has had some drainage from a sore on the tip of the toe  Neurological:       Relates numbness and paresthesias associated with her diabetes  Endo/Heme/Allergies: Negative.   Psychiatric/Behavioral: Negative.    Blood pressure 124/54, pulse 64, temperature 98 F (36.7 C), temperature source Oral, resp. rate 18, height '5\' 3"'  (1.6 m), weight 79.606 kg (175 lb 8 oz), SpO2 98 %. Physical Exam  Cardiovascular:  DP and PT pulses are palpable. Capillary filling time intact with exception of the left third toe  Musculoskeletal:  Adequate range of motion. Muscle testing is 5 over 5.  Neurological:  Loss of protective threshold monofilament wire distally in the toes. Proprioception is impaired  Skin:  The skin is warm dry and somewhat atrophic. Some bilateral edema. Significant erythema and edema in the left third toe with an ulceration at the distal tip    Assessment/Plan: Assessment: Osteomyelitis left third toe. Diabetes with associated neuropathy  Plan: A light dressing was applied on the left third toe to cover the ulceration. Discussed with the patient amputation of the left third toe and what this would involve. At this point we will have to confer with cardiology about timing since she is on some blood thinning medicine. Discussed risks and complications of the surgery. We will obtain informed consent and plan for surgery in the next day or 2. Nothing by mouth after midnight  Amberli Ruegg W. 11/08/2015, 8:16 AM

## 2015-11-08 NOTE — Progress Notes (Signed)
Inpatient Diabetes Program Recommendations  AACE/ADA: New Consensus Statement on Inpatient Glycemic Control (2015)  Target Ranges:  Prepandial:   less than 140 mg/dL      Peak postprandial:   less than 180 mg/dL (1-2 hours)      Critically ill patients:  140 - 180 mg/dL    Results for Mellody DanceGLASS, Malanie (MRN [id number]) as of 11/08/2015 11:02  Ref. Range 11/07/2015 15:56 11/07/2015 20:58 11/08/2015 07:43  Glucose-Capillary Latest Ref Range: 65-99 mg/dL 92 409153 (H) 96    Admit with: Osteomyelitis of toe  History: DM, CVA, HTN  Home DM Meds: Lantus 50 units QHS  Current Insulin Orders: Lantus 20 units QHS      Novolog Sensitive SSI (0-9 units) TID AC     -Note patient will need amputation of toe.  -Currently on reduced dose of Lantus (20 units QHS).  -CBGs well controlled so far.    --Will follow patient during hospitalization--  Ambrose FinlandJeannine Johnston Aquil Duhe RN, MSN, CDE Diabetes Coordinator Inpatient Glycemic Control Team Team Pager: [phone] (8a-5p)

## 2015-11-08 NOTE — Progress Notes (Signed)
[facility] - [place] at [facility]   PATIENT NAME: Jessica DanceBarbara Donovan    MR#:  [id number]  DATE OF BIRTH:  09/04/1946  SUBJECTIVE:  CHIEF COMPLAINT:  No chief complaint on file.  left foot pain.  REVIEW OF SYSTEMS:  CONSTITUTIONAL: No fever, fatigue or weakness.  EYES: No blurred or double vision.  EARS, NOSE, AND THROAT: No tinnitus or ear pain.  RESPIRATORY: No cough, shortness of breath, wheezing or hemoptysis.  CARDIOVASCULAR: No chest pain, orthopnea, edema.  GASTROINTESTINAL: No nausea, vomiting, diarrhea or abdominal pain.  GENITOURINARY: No dysuria, hematuria.  ENDOCRINE: No polyuria, nocturia,  HEMATOLOGY: No anemia, easy bruising or bleeding SKIN: No rash or lesion. MUSCULOSKELETAL: Left foot pain. NEUROLOGIC: No tingling, numbness, weakness.  PSYCHIATRY: No anxiety or depression.   DRUG ALLERGIES:   Allergies  Allergen Reactions  . Codeine Nausea And Vomiting  . Influenza Vaccines Other (See Comments)    Passed out  . Methadone Hives  . Percocet [Oxycodone-Acetaminophen] Nausea And Vomiting  . Tetanus Toxoids Swelling  . Tetracyclines & Related Rash    VITALS:  Blood pressure 124/54, pulse 64, temperature 98 F (36.7 C), temperature source Oral, resp. rate 18, height 5\' 3"  (1.6 m), weight 79.606 kg (175 lb 8 oz), SpO2 98 %.  PHYSICAL EXAMINATION:  GENERAL:  69 y.o.-year-old patient lying in the bed with no acute distress.  EYES: Pupils equal, round, reactive to light and accommodation. No scleral icterus. Extraocular muscles intact.  HEENT: Head atraumatic, normocephalic. Oropharynx and nasopharynx clear.  NECK:  Supple, no jugular venous distention. No thyroid enlargement, no tenderness.  LUNGS: Normal breath sounds bilaterally, no wheezing, rales,rhonchi or crepitation. No use of accessory muscles of respiration.  CARDIOVASCULAR: S1, S2 normal. No murmurs, rubs, or gallops.  ABDOMEN: Soft, nontender, nondistended. Bowel sounds  present. No organomegaly or mass.  EXTREMITIES: No pedal edema, cyanosis, or clubbing. Left 3rd toe in dressing with tenderness. NEUROLOGIC: Cranial nerves II through XII are intact. Muscle strength 5/5 in all extremities. Sensation intact. Gait not checked.  PSYCHIATRIC: The patient is alert and oriented x 3.  SKIN: No obvious rash, lesion, or ulcer.    LABORATORY PANEL:   CBC  Recent Labs Lab 11/08/15 0447  WBC 6.9  HGB 11.4*  HCT 34.5*  PLT 261   ------------------------------------------------------------------------------------------------------------------  Chemistries   Recent Labs Lab 11/07/15 1510 11/08/15 0447  NA 138 140  K 4.2 4.0  CL 102 104  CO2 30 30  GLUCOSE 115* 97  BUN 22* 18  CREATININE 0.68 0.73  CALCIUM 9.5 9.0  AST 19  --   ALT 19  --   ALKPHOS 97  --   BILITOT 0.5  --    ------------------------------------------------------------------------------------------------------------------  Cardiac Enzymes No results for input(s): TROPONINI in the last 168 hours. ------------------------------------------------------------------------------------------------------------------  RADIOLOGY:  Dg Foot 2 Views Left  11/07/2015  CLINICAL DATA:  Chronic left foot pain x3 months, 3rd tilt is red and swollen EXAM: LEFT FOOT - 2 VIEW COMPARISON:  None. FINDINGS: Suspected cortical irregularity/destructive changes involving the 3rd distal phalanx with overlying soft tissue swelling, worrisome for acute osteomyelitis. Moderate degenerative changes involving the 1st IP joint. Large plantar and posterior calcaneal enthesophytes. IMPRESSION: Cortical irregularity/destructive changes involving the 3rd distal phalanx, worrisome for acute osteomyelitis. Electronically Signed   By: Charline BillsSriyesh  Krishnan M.D.   On: 11/07/2015 15:53    EKG:   Orders placed or performed in visit on 12/15/14  . EKG 12-Lead    ASSESSMENT AND PLAN:   *  Osteomyelitis of left third toe.   Positive MRSA. Add vancomycin, discontinue Unasyn, start Zosyn, hold aspirin and Lantana, follow-up with Dr. Alberteen Spindle for possible amputation.  * Coronary artery disease with recurrent angina She was scheduled cardiac catheterization next week as outpatient. Dr. Juliann Pares will do cardiac catheterization next week after foot surgery.  * Diabetes She is on very high-dose of insulin at home Continue Lantus 20 units SQ HS and insulin sliding scale.  * Hypertension Continue Lopressor, Lasix, imdur and lisinopril.  * Chronic pain She is on long-acting and immediate release morphine at home, will continue the same dose here in hospital.     All the records are reviewed and case discussed with Care Management/Social Workerr. Management plans discussed with the patient, family and they are in agreement. Greater than 50% time was spent on coordination of care and face-to-face counseling. CODE STATUS: DO NOT RESUSCITATE  TOTAL TIME TAKING CARE OF THIS PATIENT: 38 minutes.   POSSIBLE D/C IN 3-4 DAYS, DEPENDING ON CLINICAL CONDITION.   Shaune Pollack M.D on 11/08/2015 at 1:41 PM  Between 7am to 6pm - Pager - [phone]  After 6pm go to [email or web address] - password [email or web address] [facility]  [place] [facility]  Office  [phone]  CC: Primary care physician; Geoffery Lyons., PA

## 2015-11-08 NOTE — Progress Notes (Signed)
Patient alert and oriented but forgetful. VSS. No complaints of pain at the moment. Dr. Juliann Paresallwood called and stated he will not be doing a cardiac cath today, and will possibly do it on Tuesday. Continue to monitor.

## 2015-11-08 NOTE — Care Management Note (Signed)
Case Management Note  Patient Details  Name: Jessica Donovan MRN: [id number] Date of Birth: [birth year]  Subjective/Objective:                  Met with patient to discuss discharge planning. She states at independent living [facility]. She has a Education officer, museum that assists her with transportation. She is from [place] originally. She is followed by Dr. Frazier Richards. She states she is not followed by endocrinologist although this might be beneficial. She is on insulin and affording it but states it is relatively expensive. She gets her Rx from [place].She has a working glucometer that she states she uses.   She has a walker and a cane but states she has not needed them. She is open to [facility] home health. She said she lives alone and will not be able to administer IV antibiotics in the home setting. She agrees to SNF if needed.   Action/Plan: I have notified [facility] Sharrie Rothman of patient admission. I have requested DM consult/endocrinology from RN. RNCM to continue to follow home health orders needed prior to discharge.   Expected Discharge Date:  11/11/15               Expected Discharge Plan:     In-House Referral:     Discharge planning Services  CM Consult  Post Acute Care Choice:  Durable Medical Equipment, Home Health Choice offered to:  Patient  DME Arranged:    DME Agency:     HH Arranged:  RN, PT, NA HH Agency:  [place]  Status of Service:  In process, will continue to follow  Medicare Important Message Given:    Date Medicare IM Given:    Medicare IM give by:    Date Additional Medicare IM Given:    Additional Medicare Important Message give by:     If discussed at [place] of Stay Meetings, dates discussed:    Additional Comments:  Marshell Garfinkel, RN 11/08/2015, 10:38 AM

## 2015-11-08 NOTE — Progress Notes (Signed)
ANTIBIOTIC CONSULT NOTE - INITIAL  Pharmacy Consult for vancomycin and piperacillin/tazobactam Indication: Osteomyelitis  Allergies  Allergen Reactions  . Codeine Nausea And Vomiting  . Influenza Vaccines Other (See Comments)    Passed out  . Methadone Hives  . Percocet [Oxycodone-Acetaminophen] Nausea And Vomiting  . Tetanus Toxoids Swelling  . Tetracyclines & Related Rash    Patient Measurements: Height:  (160 cm) Weight: 175 lb 8 oz (79.606 kg) IBW/kg (Calculated) : 52.4 Adjusted Body Weight: 63 kg  Vital Signs: Temp: 98 F (36.7 C) (11/18 0805) Temp Source: Oral (11/18 0805) BP: 124/54 mmHg (11/18 0805) Pulse Rate: 64 (11/18 0805) Intake/Output from previous day: 11/17 0701 - 11/18 0700 In: 580 [P.O.:480; IV Piggyback:100] Out: 500 [Urine:500] Intake/Output from this shift:    Labs:  Recent Labs  11/07/15 1510 11/08/15 0447  WBC 6.7 6.9  HGB 12.3 11.4*  PLT 284 261  CREATININE 0.68 0.73   Estimated Creatinine Clearance: 66.3 mL/min (by C-G formula based on Cr of 0.73). No results for input(s): VANCOTROUGH, VANCOPEAK, VANCORANDOM, GENTTROUGH, GENTPEAK, GENTRANDOM, TOBRATROUGH, TOBRAPEAK, TOBRARND, AMIKACINPEAK, AMIKACINTROU, AMIKACIN in the last 72 hours.   Microbiology: Recent Results (from the past 720 hour(s))  Surgical pcr screen     Status: Abnormal   Collection Time: 11/07/15  6:44 PM  Result Value Ref Range Status   MRSA, PCR POSITIVE (A) NEGATIVE Final    Comment: CRITICAL RESULT CALLED TO, READ BACK BY AND VERIFIED WITH: MATT PAGE AT 2058 11/07/15 MLZ     Staphylococcus aureus POSITIVE (A) NEGATIVE Final    Comment:        The Xpert SA Assay (FDA approved for NASAL specimens in patients over 68 years of age), is one component of a comprehensive surveillance program.  Test performance has been validated by [facility] for patients greater than or equal to 3 year old. It is not intended to diagnose infection nor to guide or monitor  treatment.     Medical History: Past Medical History  Diagnosis Date  . Hypertension   . Diabetes mellitus without complication (HCC)   . Coronary artery disease   . Low back pain   . Stroke (HCC)   . Myocardial infarction (HCC)   . Chronic back pain     Medications:  Anti-infectives    Start     Dose/Rate Route Frequency Ordered Stop   11/09/15 0300  vancomycin (VANCOCIN) IVPB 750 mg/150 ml premix     750 mg 150 mL/hr over 60 Minutes Intravenous Every 12 hours 11/08/15 1358     11/08/15 1500  vancomycin (VANCOCIN) 1,250 mg in sodium chloride 0.9 % 250 mL IVPB     1,250 mg 166.7 mL/hr over 90 Minutes Intravenous  Once 11/08/15 1355     11/08/15 1500  piperacillin-tazobactam (ZOSYN) IVPB 3.375 g     3.375 g 12.5 mL/hr over 240 Minutes Intravenous 3 times per day 11/08/15 1402     11/07/15 1730  ampicillin-sulbactam (UNASYN) 1.5 g in sodium chloride 0.9 % 50 mL IVPB  Status:  Discontinued     1.5 g 100 mL/hr over 30 Minutes Intravenous Every 8 hours 11/07/15 1512 11/08/15 1351     Assessment: Pharmacy consulted to dose vancomycin and piperacillin/tazobactam on this 69 year old female who was directly admitted from podiatrist office due to suspected osteomyelitis of the toe 2/2 lower extremity infection. Patient was initially started on ampicillin/sulbactam, but antibiotics were changed to piperacillin/tazobactam and vancomycin today.   Today is day #2  of antibiotics Patient will likely undergo amputation of infected toe, OM confirmed by xray   Kinetics: Adjusted wt = 63 kg; adjusted CrCl ~ 72 mL/min ke = 0.064 Half-life: 10.8 hours Vd = 44 L Cmin (calculated): ~15.5 mcg/mL  Goal of Therapy:  Vancomycin trough level 15-20 mcg/ml  Plan:  Measure antibiotic drug levels at steady state Follow up culture results  Gave vancomycin 1250 mg IV one time dose followed by vancomycin 750 mg IV q12h Vancomycin trough ordered for 11/20 at 1430, which is prior to 5th dose and should  represent steady state.   Piperacillin/tazobactam 3.375 g IV q8h EI based on renal function and indication  Pharmacy will continue to monitor, thank you for the consult.   Jodelle RedMary M Myleen Brailsford 11/08/2015,2:03 PM

## 2015-11-09 LAB — CREATININE, SERUM: Creatinine, Ser: 0.78 mg/dL (ref 0.44–1.00)

## 2015-11-09 LAB — GLUCOSE, CAPILLARY
GLUCOSE-CAPILLARY: 147 mg/dL — AB (ref 65–99)
Glucose-Capillary: 99 mg/dL (ref 65–99)
Glucose-Capillary: 160 mg/dL — ABNORMAL HIGH (ref 65–99)
Glucose-Capillary: 111 mg/dL — ABNORMAL HIGH (ref 65–99)

## 2015-11-09 MED ORDER — ENOXAPARIN SODIUM 40 MG/0.4ML ~~LOC~~ SOLN
40.0000 mg | SUBCUTANEOUS | Status: DC
  Administered 2015-11-09 – 2015-11-10 (×2): 40 mg via SUBCUTANEOUS
  Filled 2015-11-09 (×2): qty 0.4

## 2015-11-09 NOTE — Consult Note (Signed)
Reason for Consult: Preoperative assessment and angina Referring Physician: Podiatry Cardiologist Dr Saralyn Pilar  Jessica Donovan is an 69 y.o. female.  HPI: Patient complains of significant chest pain and angina as scheduled to be have cardiac catheter as an outpatient because of recent anginal symptoms she done reasonably well medically. Now the patient presents with osteomyelitis requiring surgery. Denies any chest pain todayshortness of breath not very active because of her toe. Patient denies any significant fever chills or sweats. Patient's had a history of hyperlipidemia. Patient has been controlled with beta blockers and Imdur. Patient's been placed on antibiotics and now is preop for total amputation. Patient states to feel reasonably well. Routine cardiac evaluation  Past Medical History  Diagnosis Date  . Hypertension   . Diabetes mellitus without complication ([place])   . Coronary artery disease   . Low back pain   . Stroke ([place])   . Myocardial infarction ([place])   . Chronic back pain     Past Surgical History  Procedure Laterality Date  . Toe amputation      Family History  Problem Relation Age of Onset  . CAD Mother   . CAD Father     Social History:  reports that she has never smoked. She does not have any smokeless tobacco history on file. Her alcohol and drug histories are not on file.  Allergies:  Allergies  Allergen Reactions  . Codeine Nausea And Vomiting  . Influenza Vaccines Other (See Comments)    Passed out  . Methadone Hives  . Percocet [Oxycodone-Acetaminophen] Nausea And Vomiting  . Tetanus Toxoids Swelling  . Tetracyclines & Related Rash    Medications: I have reviewed the patient's current medications.  Results for orders placed or performed during the hospital encounter of 11/07/15 (from the past 48 hour(s))  Glucose, capillary     Status: Abnormal   Collection Time: 11/07/15  8:58 PM  Result Value Ref Range   Glucose-Capillary 153 (H) 65 - 99  mg/dL   Comment 1 Notify RN   Basic metabolic panel     Status: None   Collection Time: 11/08/15  4:47 AM  Result Value Ref Range   Sodium 140 135 - 145 mmol/L   Potassium 4.0 3.5 - 5.1 mmol/L   Chloride 104 101 - 111 mmol/L   CO2 30 22 - 32 mmol/L   Glucose, Bld 97 65 - 99 mg/dL   BUN 18 6 - 20 mg/dL   Creatinine, Ser 0.73 0.44 - 1.00 mg/dL   Calcium 9.0 8.9 - 10.3 mg/dL   GFR calc non Af Amer >60 >60 mL/min   GFR calc Af Amer >60 >60 mL/min    Comment: (NOTE) The eGFR has been calculated using the CKD EPI equation. This calculation has not been validated in all clinical situations. eGFR's persistently <60 mL/min signify possible Chronic Kidney Disease.    Anion gap 6 5 - 15  CBC     Status: Abnormal   Collection Time: 11/08/15  4:47 AM  Result Value Ref Range   WBC 6.9 3.6 - 11.0 K/uL   RBC 3.65 (L) 3.80 - 5.20 MIL/uL   Hemoglobin 11.4 (L) 12.0 - 16.0 g/dL   HCT 34.5 (L) 35.0 - 47.0 %   MCV 94.5 80.0 - 100.0 fL   MCH 31.4 26.0 - 34.0 pg   MCHC 33.2 32.0 - 36.0 g/dL   RDW 13.6 11.5 - 14.5 %   Platelets 261 150 - 440 K/uL  Glucose, capillary  Status: None   Collection Time: 11/08/15  7:43 AM  Result Value Ref Range   Glucose-Capillary 96 65 - 99 mg/dL  Glucose, capillary     Status: Abnormal   Collection Time: 11/08/15 11:47 AM  Result Value Ref Range   Glucose-Capillary 138 (H) 65 - 99 mg/dL  Glucose, capillary     Status: Abnormal   Collection Time: 11/08/15  4:33 PM  Result Value Ref Range   Glucose-Capillary 129 (H) 65 - 99 mg/dL  Glucose, capillary     Status: None   Collection Time: 11/08/15  9:50 PM  Result Value Ref Range   Glucose-Capillary 89 65 - 99 mg/dL  Creatinine, serum     Status: None   Collection Time: 11/09/15  4:12 AM  Result Value Ref Range   Creatinine, Ser 0.78 0.44 - 1.00 mg/dL   GFR calc non Af Amer >60 >60 mL/min   GFR calc Af Amer >60 >60 mL/min    Comment: (NOTE) The eGFR has been calculated using the CKD EPI equation. This  calculation has not been validated in all clinical situations. eGFR's persistently <60 mL/min signify possible Chronic Kidney Disease.   Glucose, capillary     Status: None   Collection Time: 11/09/15  7:26 AM  Result Value Ref Range   Glucose-Capillary 99 65 - 99 mg/dL   Comment 1 Notify RN   Glucose, capillary     Status: Abnormal   Collection Time: 11/09/15 11:14 AM  Result Value Ref Range   Glucose-Capillary 160 (H) 65 - 99 mg/dL   Comment 1 Notify RN   Glucose, capillary     Status: Abnormal   Collection Time: 11/09/15  3:59 PM  Result Value Ref Range   Glucose-Capillary 111 (H) 65 - 99 mg/dL   Comment 1 Notify RN     No results found.  Review of Systems  Constitutional: Positive for malaise/fatigue.  HENT: Negative.   Eyes: Negative.   Respiratory: Negative.   Cardiovascular: Positive for chest pain, claudication and leg swelling.  Gastrointestinal: Negative.   Genitourinary: Negative.   Musculoskeletal: Negative.   Skin: Negative.   Neurological: Positive for weakness.  Endo/Heme/Allergies: Negative.   Psychiatric/Behavioral: Negative.    Blood pressure 108/56, pulse 61, temperature 98 F (36.7 C), temperature source Oral, resp. rate 16, height '5\' 3"'  (1.6 m), weight 79.606 kg (175 lb 8 oz), SpO2 91 %. Physical Exam  Nursing note and vitals reviewed. Constitutional: She is oriented to person, place, and time. She appears well-developed and well-nourished.  HENT:  Head: Normocephalic and atraumatic.  Eyes: Conjunctivae and EOM are normal. Pupils are equal, round, and reactive to light.  Neck: Normal range of motion. Neck supple.  Cardiovascular: Normal rate and regular rhythm.   Respiratory: Effort normal and breath sounds normal.  GI: Soft. Bowel sounds are normal.  Musculoskeletal: Normal range of motion.  Neurological: She is alert and oriented to person, place, and time. She has normal reflexes.  Skin: Skin is warm and dry.  Psychiatric: She exhibits a  depressed mood.    Assessment/Plan: Preop for toe surgery Osteomyelitis GERD Hypertension Diabetes Angina Depression Hyperlipidemia . PLAN Patient is an  acceptable surgical risk would consider beta blockers pre and post op  Would consider cardiac catheter after the surgery Continue diabetes management and control Continue hypertension control with beta blocker Continue Lipitor treatment for hyperlipidemia Protonix for GERD symptoms. Postop Continue antibiotic therapy for osteomyelitis Agree with Imdur for angina symptoms Continue pain control Continue pain  control with narcotics as necessary Continue DVT prophylaxis    Myeesha Shane D. 11/09/2015, 7:35 PM

## 2015-11-09 NOTE — Progress Notes (Signed)
Spoke with Dr.Sudini regarding pt's bp this am 106/54, hr- 51, recheck at 0949 118/52, hr- 70. MD to review the blood pressure/fluid medications at rounds.

## 2015-11-09 NOTE — Progress Notes (Signed)
Patient resting in bed, assessed for chest pain, voiced no chest pain. Will continue to monitor the patient.

## 2015-11-09 NOTE — Progress Notes (Signed)
[facility] - [place] at [facility]   PATIENT NAME: Jessica Donovan    MR#:  [id number]  DATE OF BIRTH:  March 02, 1946  SUBJECTIVE:  CHIEF COMPLAINT:  No chief complaint on file.  Still has left foot pain. No CP/SOB. Afebrile.  REVIEW OF SYSTEMS:  CONSTITUTIONAL: No fever, fatigue or weakness.  EYES: No blurred or double vision.  EARS, NOSE, AND THROAT: No tinnitus or ear pain.  RESPIRATORY: No cough, shortness of breath, wheezing or hemoptysis.  CARDIOVASCULAR: No chest pain, orthopnea, edema.  GASTROINTESTINAL: No nausea, vomiting, diarrhea or abdominal pain.  GENITOURINARY: No dysuria, hematuria.  ENDOCRINE: No polyuria, nocturia,  HEMATOLOGY: No anemia, easy bruising or bleeding SKIN: No rash or lesion. MUSCULOSKELETAL: Left foot pain. NEUROLOGIC: No tingling, numbness, weakness.  PSYCHIATRY: No anxiety or depression.   DRUG ALLERGIES:   Allergies  Allergen Reactions  . Codeine Nausea And Vomiting  . Influenza Vaccines Other (See Comments)    Passed out  . Methadone Hives  . Percocet [Oxycodone-Acetaminophen] Nausea And Vomiting  . Tetanus Toxoids Swelling  . Tetracyclines & Related Rash    VITALS:  Blood pressure 118/52, pulse 70, temperature 97.2 F (36.2 C), temperature source Oral, resp. rate 17, height  (1.6 m), weight 79.606 kg (175 lb 8 oz), SpO2 94 %.  PHYSICAL EXAMINATION:  GENERAL:  69 y.o.-year-old patient lying in the bed with no acute distress.  EYES: Pupils equal, round, reactive to light and accommodation. No scleral icterus. Extraocular muscles intact.  HEENT: Head atraumatic, normocephalic. Oropharynx and nasopharynx clear.  NECK:  Supple, no jugular venous distention. No thyroid enlargement, no tenderness.  LUNGS: Normal breath sounds bilaterally, no wheezing, rales,rhonchi or crepitation. No use of accessory muscles of respiration.  CARDIOVASCULAR: S1, S2 normal. No murmurs, rubs, or gallops.  ABDOMEN: Soft,  nontender, nondistended. Bowel sounds present. No organomegaly or mass.  EXTREMITIES: No pedal edema, cyanosis, or clubbing. Left 3rd toe in dressing with tenderness. NEUROLOGIC: Cranial nerves II through XII are intact. Muscle strength 5/5 in all extremities. Sensation intact. Gait not checked.  PSYCHIATRIC: The patient is alert and oriented x 3.  SKIN: Left 3rd toe ulcer   LABORATORY PANEL:   CBC  Recent Labs Lab 11/08/15 0447  WBC 6.9  HGB 11.4*  HCT 34.5*  PLT 261   ------------------------------------------------------------------------------------------------------------------  Chemistries   Recent Labs Lab 11/07/15 1510 11/08/15 0447 11/09/15 0412  NA 138 140  --   K 4.2 4.0  --   CL 102 104  --   CO2 30 30  --   GLUCOSE 115* 97  --   BUN 22* 18  --   CREATININE 0.68 0.73 0.78  CALCIUM 9.5 9.0  --   AST 19  --   --   ALT 19  --   --   ALKPHOS 97  --   --   BILITOT 0.5  --   --    ------------------------------------------------------------------------------------------------------------------  Cardiac Enzymes No results for input(s): TROPONINI in the last 168 hours. ------------------------------------------------------------------------------------------------------------------  RADIOLOGY:  Dg Foot 2 Views Left  11/07/2015  CLINICAL DATA:  Chronic left foot pain x3 months, 3rd tilt is red and swollen EXAM: LEFT FOOT - 2 VIEW COMPARISON:  None. FINDINGS: Suspected cortical irregularity/destructive changes involving the 3rd distal phalanx with overlying soft tissue swelling, worrisome for acute osteomyelitis. Moderate degenerative changes involving the 1st IP joint. Large plantar and posterior calcaneal enthesophytes. IMPRESSION: Cortical irregularity/destructive changes involving the 3rd distal phalanx, worrisome for acute osteomyelitis.  Electronically Signed   By: Charline BillsSriyesh  Krishnan M.D.   On: 11/07/2015 15:53    EKG:   Orders placed or performed in visit  on 12/15/14  . EKG 12-Lead    ASSESSMENT AND PLAN:   * Osteomyelitis of left 3rd phalanx.  Positive MRSA. On IV vanc and Zosyn To get toe amputation with podiatry.  * Coronary artery disease with recurrent angina Await cardiology input regarding her cathetrization  * Diabetes She is on very high-dose of insulin at home Continue Lantus 20 units SQ HS and insulin sliding scale.  * Hypertension Continue Lopressor, Lasix, imdur and lisinopril.  * Chronic pain She is on long-acting and immediate release morphine at home, will continue the same dose here in hospital.  * DVT prophylaxis Lovenox   CODE STATUS: DO NOT RESUSCITATE  TOTAL TIME TAKING CARE OF THIS PATIENT: 35 minutes.   POSSIBLE D/C IN 3-4 DAYS, DEPENDING ON CLINICAL CONDITION.   Milagros LollSudini, Roch Quach R M.D on 11/09/2015 at 12:54 PM  Between 7am to 6pm - Pager - [phone]  After 6pm go to [email or web address] - password [email or web address]  [facility] [place] [facility]  Office  [phone]  CC: Primary care physician; Geoffery Lyonsurner, Eric M., PA

## 2015-11-09 NOTE — Progress Notes (Signed)
  Subjective: Patient seen. Sitting up in bed, doing well  Objective: Vital signs in last 24 hours: Temp:  [97.2 F (36.2 C)-99.1 F (37.3 C)] 97.2 F (36.2 C) (11/19 0732) Pulse Rate:  [50-74] 51 (11/19 0732) Resp:  [17-18] 17 (11/19 0732) BP: (100-148)/(46-62) 106/54 mmHg (11/19 0732) SpO2:  [90 %-97 %] 96 % (11/19 0732) Last BM Date: 11/08/15  Intake/Output from previous day: 11/18 0701 - 11/19 0700 In: 1010 [P.O.:760; IV Piggyback:250] Out: 1600 [Urine:1600] Intake/Output this shift:    Bandage on the left foot is intact. Left in place.  Lab Results:   Recent Labs  11/07/15 1510 11/08/15 0447  WBC 6.7 6.9  HGB 12.3 11.4*  HCT 37.0 34.5*  PLT 284 261   BMET  Recent Labs  11/07/15 1510 11/08/15 0447 11/09/15 0412  NA 138 140  --   K 4.2 4.0  --   CL 102 104  --   CO2 30 30  --   GLUCOSE 115* 97  --   BUN 22* 18  --   CREATININE 0.68 0.73 0.78  CALCIUM 9.5 9.0  --    PT/INR No results for input(s): LABPROT, INR in the last 72 hours. ABG No results for input(s): PHART, HCO3 in the last 72 hours.  Invalid input(s): PCO2, PO2  Studies/Results: Dg Foot 2 Views Left  11/07/2015  CLINICAL DATA:  Chronic left foot pain x3 months, 3rd tilt is red and swollen EXAM: LEFT FOOT - 2 VIEW COMPARISON:  None. FINDINGS: Suspected cortical irregularity/destructive changes involving the 3rd distal phalanx with overlying soft tissue swelling, worrisome for acute osteomyelitis. Moderate degenerative changes involving the 1st IP joint. Large plantar and posterior calcaneal enthesophytes. IMPRESSION: Cortical irregularity/destructive changes involving the 3rd distal phalanx, worrisome for acute osteomyelitis. Electronically Signed   By: Charline BillsSriyesh  Krishnan M.D.   On: 11/07/2015 15:53    Anti-infectives: Anti-infectives    Start     Dose/Rate Route Frequency Ordered Stop   11/09/15 0300  vancomycin (VANCOCIN) IVPB 750 mg/150 ml premix     750 mg 150 mL/hr over 60 Minutes  Intravenous Every 12 hours 11/08/15 1358     11/08/15 1500  vancomycin (VANCOCIN) 1,250 mg in sodium chloride 0.9 % 250 mL IVPB     1,250 mg 166.7 mL/hr over 90 Minutes Intravenous  Once 11/08/15 1355 11/08/15 1730   11/08/15 1500  piperacillin-tazobactam (ZOSYN) IVPB 3.375 g     3.375 g 12.5 mL/hr over 240 Minutes Intravenous 3 times per day 11/08/15 1402     11/07/15 1730  ampicillin-sulbactam (UNASYN) 1.5 g in sodium chloride 0.9 % 50 mL IVPB  Status:  Discontinued     1.5 g 100 mL/hr over 30 Minutes Intravenous Every 8 hours 11/07/15 1512 11/08/15 1351      Assessment/Plan: s/p Procedure(s): AMPUTATION TOE (Left) Assessment: Osteomyelitis left third toe   Plan: Discussed with the patient that we are planning for amputation of the left third toe tomorrow morning. Decision was made to hold off 1 more day on her amputation due to her blood thinning medication. The patient will be nothing by mouth after midnight tonight.  LOS: 2 days    Jessica Donovan W. 11/09/2015

## 2015-11-10 ENCOUNTER — Encounter: Payer: Self-pay | Admitting: Anesthesiology

## 2015-11-10 ENCOUNTER — Encounter
Admission: AD | Disposition: A | Discharge status: Skilled Nursing Facility | Payer: Self-pay | Source: Ambulatory Visit | Attending: Internal Medicine

## 2015-11-10 ENCOUNTER — Inpatient Hospital Stay: Payer: Medicare Other | Admitting: Anesthesiology

## 2015-11-10 HISTORY — PX: AMPUTATION TOE: SHX6595

## 2015-11-10 LAB — GLUCOSE, CAPILLARY
GLUCOSE-CAPILLARY: 96 mg/dL (ref 65–99)
GLUCOSE-CAPILLARY: 76 mg/dL (ref 65–99)
GLUCOSE-CAPILLARY: 118 mg/dL — AB (ref 65–99)
Glucose-Capillary: 125 mg/dL — ABNORMAL HIGH (ref 65–99)

## 2015-11-10 LAB — VANCOMYCIN, TROUGH: Vancomycin Tr: 13 ug/mL (ref 10–20)

## 2015-11-10 SURGERY — AMPUTATION, TOE
Anesthesia: Monitor Anesthesia Care | Laterality: Left

## 2015-11-10 MED ORDER — VANCOMYCIN HCL IN DEXTROSE 1-5 GM/200ML-% IV SOLN
1000.0000 mg | Freq: Two times a day (BID) | INTRAVENOUS | Status: DC
  Administered 2015-11-11: 1000 mg via INTRAVENOUS
  Filled 2015-11-10 (×3): qty 200

## 2015-11-10 MED ORDER — SODIUM CHLORIDE 0.9 % IJ SOLN
3.0000 mL | Freq: Two times a day (BID) | INTRAMUSCULAR | Status: DC
  Administered 2015-11-10 – 2015-11-12 (×4): 3 mL via INTRAVENOUS

## 2015-11-10 MED ORDER — SODIUM CHLORIDE 0.9 % IV BOLUS (SEPSIS)
500.0000 mL | Freq: Once | INTRAVENOUS | Status: AC
  Administered 2015-11-10: 500 mL via INTRAVENOUS

## 2015-11-10 MED ORDER — SODIUM CHLORIDE 0.9 % IV SOLN: 250.0000 mL | INTRAVENOUS | Status: DC | PRN

## 2015-11-10 MED ORDER — CHLORHEXIDINE GLUCONATE CLOTH 2 % EX PADS
6.0000 | MEDICATED_PAD | Freq: Every day | CUTANEOUS | Status: DC
  Administered 2015-11-10 – 2015-11-12 (×3): 6 via TOPICAL

## 2015-11-10 MED ORDER — PROPOFOL 500 MG/50ML IV EMUL
INTRAVENOUS | Status: DC | PRN
  Administered 2015-11-10: 50 ug/kg/min via INTRAVENOUS

## 2015-11-10 MED ORDER — ROPIVACAINE HCL 5 MG/ML IJ SOLN
INTRAMUSCULAR | Status: DC | PRN
  Administered 2015-11-10: 10 mL
  Administered 2015-11-10: 10 mL via EPIDURAL
  Administered 2015-11-10: 10 mL via PERINEURAL

## 2015-11-10 MED ORDER — MUPIROCIN 2 % EX OINT
1.0000 "application " | TOPICAL_OINTMENT | Freq: Two times a day (BID) | CUTANEOUS | Status: DC
  Administered 2015-11-10 – 2015-11-12 (×5): 1 via NASAL
  Filled 2015-11-10: qty 22

## 2015-11-10 MED ORDER — NEOMYCIN-POLYMYXIN B GU 40-200000 IR SOLN
Status: DC | PRN
  Administered 2015-11-10: 2 mL

## 2015-11-10 MED ORDER — BUPIVACAINE HCL (PF) 0.5 % IJ SOLN
INTRAMUSCULAR | Status: AC
  Filled 2015-11-10: qty 30

## 2015-11-10 MED ORDER — ONDANSETRON HCL 4 MG/2ML IJ SOLN
INTRAMUSCULAR | Status: DC | PRN
  Administered 2015-11-10: 4 mg via INTRAVENOUS

## 2015-11-10 MED ORDER — FENTANYL CITRATE (PF) 100 MCG/2ML IJ SOLN
INTRAMUSCULAR | Status: DC | PRN
  Administered 2015-11-10 (×2): 25 ug via INTRAVENOUS

## 2015-11-10 MED ORDER — HYDROMORPHONE HCL 1 MG/ML IJ SOLN
0.2500 mg | INTRAMUSCULAR | Status: DC | PRN
Start: 2015-11-10 — End: 2015-11-11

## 2015-11-10 MED ORDER — PHENYLEPHRINE HCL 10 MG/ML IJ SOLN
INTRAMUSCULAR | Status: DC | PRN
  Administered 2015-11-10: 100 ug via INTRAVENOUS

## 2015-11-10 MED ORDER — FENTANYL CITRATE (PF) 100 MCG/2ML IJ SOLN
25.0000 ug | INTRAMUSCULAR | Status: DC | PRN
Start: 2015-11-10 — End: 2015-11-11

## 2015-11-10 MED ORDER — MIDAZOLAM HCL 2 MG/2ML IJ SOLN
INTRAMUSCULAR | Status: DC | PRN
  Administered 2015-11-10: 1 mg via INTRAVENOUS

## 2015-11-10 MED ORDER — SODIUM CHLORIDE 0.9 % IJ SOLN: 3.0000 mL | INTRAMUSCULAR | Status: DC | PRN

## 2015-11-10 MED ORDER — BUPIVACAINE HCL 0.5 % IJ SOLN
INTRAMUSCULAR | Status: DC | PRN
  Administered 2015-11-10: 5 mL

## 2015-11-10 SURGICAL SUPPLY — 45 items
BANDAGE ACE 4X5 VEL STRL LF (GAUZE/BANDAGES/DRESSINGS) ×3 IMPLANT
BLADE MED AGGRESSIVE (BLADE) ×3 IMPLANT
BLADE OSC/SAGITTAL MD 5.5X18 (BLADE) IMPLANT
BLADE SURG 15 STRL LF DISP TIS (BLADE) ×2 IMPLANT
BLADE SURG 15 STRL SS (BLADE) ×4
BLADE SURG MINI STRL (BLADE) ×3 IMPLANT
BNDG COHESIVE 4X5 WHT NS (GAUZE/BANDAGES/DRESSINGS) ×3 IMPLANT
BNDG ESMARK 4X12 TAN STRL LF (GAUZE/BANDAGES/DRESSINGS) ×3 IMPLANT
BNDG GAUZE 4.5X4.1 6PLY STRL (MISCELLANEOUS) ×3 IMPLANT
CANISTER SUCT 1200ML W/VALVE (MISCELLANEOUS) ×3 IMPLANT
CLOSURE WOUND 1/4X4 (GAUZE/BANDAGES/DRESSINGS) ×1
CUFF TOURN 18 STER (MISCELLANEOUS) ×3 IMPLANT
CUFF TOURN DUAL PL 12 NO SLV (MISCELLANEOUS) ×3 IMPLANT
DRAPE FLUOR MINI C-ARM 54X84 (DRAPES) IMPLANT
DURAPREP 26ML APPLICATOR (WOUND CARE) ×3 IMPLANT
GAUZE FLUFF 18X24 1PLY STRL (GAUZE/BANDAGES/DRESSINGS) ×3 IMPLANT
GAUZE PETRO XEROFOAM 1X8 (MISCELLANEOUS) ×3 IMPLANT
GAUZE SPONGE 4X4 12PLY STRL (GAUZE/BANDAGES/DRESSINGS) ×3 IMPLANT
GAUZE STRETCH 2X75IN STRL (MISCELLANEOUS) ×3 IMPLANT
GAUZE XEROFORM 4X4 STRL (GAUZE/BANDAGES/DRESSINGS) ×3 IMPLANT
GLOVE BIO SURGEON STRL SZ7.5 (GLOVE) ×9 IMPLANT
GLOVE INDICATOR 8.0 STRL GRN (GLOVE) ×3 IMPLANT
GOWN STRL REUS W/ TWL LRG LVL3 (GOWN DISPOSABLE) ×2 IMPLANT
GOWN STRL REUS W/TWL LRG LVL3 (GOWN DISPOSABLE) ×4
HANDPIECE VERSAJET DEBRIDEMENT (MISCELLANEOUS) IMPLANT
KIT RM TURNOVER STRD PROC AR (KITS) ×3 IMPLANT
LABEL OR SOLS (LABEL) ×3 IMPLANT
NEEDLE FILTER BLUNT 18X 1/2SAF (NEEDLE) ×2
NEEDLE FILTER BLUNT 18X1 1/2 (NEEDLE) ×1 IMPLANT
NEEDLE HYPO 25X1 1.5 SAFETY (NEEDLE) ×6 IMPLANT
NS IRRIG 500ML POUR BTL (IV SOLUTION) ×3 IMPLANT
PACK EXTREMITY ARMC (MISCELLANEOUS) ×3 IMPLANT
PAD GROUND ADULT SPLIT (MISCELLANEOUS) ×3 IMPLANT
PENCIL ELECTRO HAND CTR (MISCELLANEOUS) IMPLANT
SOL .9 NS 3000ML IRR  AL (IV SOLUTION) ×2
SOL .9 NS 3000ML IRR UROMATIC (IV SOLUTION) ×1 IMPLANT
SOL PREP PVP 2OZ (MISCELLANEOUS) ×3
SOLUTION PREP PVP 2OZ (MISCELLANEOUS) ×1 IMPLANT
STOCKINETTE STRL 6IN 960660 (GAUZE/BANDAGES/DRESSINGS) ×3 IMPLANT
STRIP CLOSURE SKIN 1/4X4 (GAUZE/BANDAGES/DRESSINGS) ×2 IMPLANT
SUT ETHILON 4-0 (SUTURE) ×4
SUT ETHILON 4-0 FS2 18XMFL BLK (SUTURE) ×2
SUT ETHILON 5-0 FS-2 18 BLK (SUTURE) ×3 IMPLANT
SUTURE ETHLN 4-0 FS2 18XMF BLK (SUTURE) ×2 IMPLANT
SYRINGE 10CC LL (SYRINGE) ×3 IMPLANT

## 2015-11-10 NOTE — Care Management Important Message (Signed)
Important Message  Patient Details  Name: Jessica Donovan MRN: [id number] Date of Birth: 01-28-1946   Medicare Important Message Given:  Yes    Jahkari Maclin A, RN 11/10/2015, 9:43 AM

## 2015-11-10 NOTE — Progress Notes (Signed)
ANTIBIOTIC CONSULT NOTE - follow up  Pharmacy Consult for vancomycin and piperacillin/tazobactam Indication: Osteomyelitis  Allergies  Allergen Reactions  . Codeine Nausea And Vomiting  . Influenza Vaccines Other (See Comments)    Passed out  . Methadone Hives  . Percocet [Oxycodone-Acetaminophen] Nausea And Vomiting  . Tetanus Toxoids Swelling  . Tetracyclines & Related Rash    Patient Measurements: Height: 5\' 3"  (160 cm) Weight: 175 lb 8 oz (79.606 kg) IBW/kg (Calculated) : 52.4 Adjusted Body Weight: 63 kg  Vital Signs: Temp: 97.9 F (36.6 C) (11/20 1425) Temp Source: Oral (11/20 1425) BP: 98/44 mmHg (11/20 1425) Pulse Rate: 61 (11/20 1425) Intake/Output from previous day: 11/19 0701 - 11/20 0700 In: 150 [IV Piggyback:150] Out: -  Intake/Output from this shift: Total I/O In: 415 [I.V.:415] Out: -   Labs:  Recent Labs  11/08/15 0447 11/09/15 0412  WBC 6.9  --   HGB 11.4*  --   PLT 261  --   CREATININE 0.73 0.78   Estimated Creatinine Clearance: 66.3 mL/min (by C-G formula based on Cr of 0.78).  Recent Labs  11/10/15 1413  VANCOTROUGH 13     Microbiology: Recent Results (from the past 720 hour(s))  Surgical pcr screen     Status: Abnormal   Collection Time: 11/07/15  6:44 PM  Result Value Ref Range Status   MRSA, PCR POSITIVE (A) NEGATIVE Final    Comment: CRITICAL RESULT CALLED TO, READ BACK BY AND VERIFIED WITH: MATT PAGE AT 2058 11/07/15 MLZ     Staphylococcus aureus POSITIVE (A) NEGATIVE Final    Comment:        The Xpert SA Assay (FDA approved for NASAL specimens in patients over 69 years of age), is one component of a comprehensive surveillance program.  Test performance has been validated by [facility] for patients greater than or equal to 69 year old. It is not intended to diagnose infection nor to guide or monitor treatment.     Medical History: Past Medical History  Diagnosis Date  . Hypertension   . Diabetes mellitus  without complication (HCC)   . Coronary artery disease   . Low back pain   . Stroke (HCC)   . Myocardial infarction (HCC)   . Chronic back pain     Medications:  Anti-infectives    Start     Dose/Rate Route Frequency Ordered Stop   11/11/15 0100  vancomycin (VANCOCIN) IVPB 1000 mg/200 mL premix     1,000 mg 200 mL/hr over 60 Minutes Intravenous Every 12 hours 11/10/15 1531     11/09/15 0300  vancomycin (VANCOCIN) IVPB 750 mg/150 ml premix  Status:  Discontinued     750 mg 150 mL/hr over 60 Minutes Intravenous Every 12 hours 11/08/15 1358 11/10/15 1531   11/08/15 1500  vancomycin (VANCOCIN) 1,250 mg in sodium chloride 0.9 % 250 mL IVPB     1,250 mg 166.7 mL/hr over 90 Minutes Intravenous  Once 11/08/15 1355 11/08/15 1730   11/08/15 1500  piperacillin-tazobactam (ZOSYN) IVPB 3.375 g     3.375 g 12.5 mL/hr over 240 Minutes Intravenous 3 times per day 11/08/15 1402     11/07/15 1730  ampicillin-sulbactam (UNASYN) 1.5 g in sodium chloride 0.9 % 50 mL IVPB  Status:  Discontinued     1.5 g 100 mL/hr over 30 Minutes Intravenous Every 8 hours 11/07/15 1512 11/08/15 1351     Assessment: Pharmacy consulted to dose vancomycin and piperacillin/tazobactam on this 69 year old female who was  directly admitted from podiatrist office due to suspected osteomyelitis of the toe 2/2 lower extremity infection. Patient was initially started on ampicillin/sulbactam, but antibiotics were changed to piperacillin/tazobactam and vancomycin today.   Patient will likely undergo amputation of infected toe, OM confirmed by xray   Kinetics: Adjusted wt = 63 kg; adjusted CrCl ~ 72 mL/min ke = 0.064 Half-life: 10.8 hours Vd = 44 L Cmin (calculated): ~15.5 mcg/mL  Goal of Therapy:  Vancomycin trough level 15-20 mcg/ml  Plan:  Measure antibiotic drug levels at steady state Follow up culture results  Gave vancomycin 1250 mg IV one time dose followed by vancomycin 750 mg IV q12h Vancomycin trough ordered for  11/20 at 1430, which is prior to 5th dose and should represent steady state.   Piperacillin/tazobactam 3.375 g IV q8h EI based on renal function and indication  11/20: Vancomycin trough= 13 mcg/ml.  Will increase Vancomycin to 1 gram IV q12h. (  dose has been hung therefore will start 1 gram dose a little earlier). Will order f/u trough prior to 4th dose of new regimen on 11/22 at 1230. Continue Zosyn. Note: patient had amputation of toe today.  Pharmacy will continue to monitor, thank you for the consult.   Bari Mantis PharmD Clinical Pharmacist 11/10/2015 3:36 PM

## 2015-11-10 NOTE — H&P (View-Only) (Signed)
Reason for Consult: Osteomyelitis of the left third toe Referring Physician: [facility]  Jessica Donovan is an 69 y.o. female.  HPI: The patient relates a several week history of a sore on the tip of her left third toe. She has been caring for this at home. Recently has started to have some increased redness and drainage from the toe. Relates pain with the toe even though she does have neuropathy which is not normal for her. She was recently seen by Dr. Ouida Sills outpatient who placed her on some Augmentin. X-rays did show osteomyelitis and decision was made to admit the patient for amputation of the toe.  Past Medical History  Diagnosis Date  . Hypertension   . Diabetes mellitus without complication ([place])   . Coronary artery disease   . Low back pain   . Stroke ([place])   . Myocardial infarction ([place])   . Chronic back pain     Past Surgical History  Procedure Laterality Date  . Toe amputation      Family History  Problem Relation Age of Onset  . CAD Mother   . CAD Father     Social History:  reports that she has never smoked. She does not have any smokeless tobacco history on file. Her alcohol and drug histories are not on file.  Allergies:  Allergies  Allergen Reactions  . Codeine Nausea And Vomiting  . Influenza Vaccines Other (See Comments)    Passed out  . Methadone Hives  . Percocet [Oxycodone-Acetaminophen] Nausea And Vomiting  . Tetanus Toxoids Swelling  . Tetracyclines & Related Rash    Medications: I have reviewed the patient's current medications.  Results for orders placed or performed during the hospital encounter of 11/07/15 (from the past 48 hour(s))  CBC     Status: None   Collection Time: 11/07/15  3:10 PM  Result Value Ref Range   WBC 6.7 3.6 - 11.0 K/uL   RBC 3.96 3.80 - 5.20 MIL/uL   Hemoglobin 12.3 12.0 - 16.0 g/dL   HCT 37.0 35.0 - 47.0 %   MCV 93.5 80.0 - 100.0 fL   MCH 31.2 26.0 - 34.0 pg   MCHC 33.4 32.0 - 36.0 g/dL   RDW  13.5 11.5 - 14.5 %   Platelets 284 150 - 440 K/uL  Comprehensive metabolic panel     Status: Abnormal   Collection Time: 11/07/15  3:10 PM  Result Value Ref Range   Sodium 138 135 - 145 mmol/L   Potassium 4.2 3.5 - 5.1 mmol/L   Chloride 102 101 - 111 mmol/L   CO2 30 22 - 32 mmol/L   Glucose, Bld 115 (H) 65 - 99 mg/dL   BUN 22 (H) 6 - 20 mg/dL   Creatinine, Ser 0.68 0.44 - 1.00 mg/dL   Calcium 9.5 8.9 - 10.3 mg/dL   Total Protein 7.0 6.5 - 8.1 g/dL   Albumin 3.9 3.5 - 5.0 g/dL   AST 19 15 - 41 U/L   ALT 19 14 - 54 U/L   Alkaline Phosphatase 97 38 - 126 U/L   Total Bilirubin 0.5 0.3 - 1.2 mg/dL   GFR calc non Af Amer >60 >60 mL/min   GFR calc Af Amer >60 >60 mL/min    Comment: (NOTE) The eGFR has been calculated using the CKD EPI equation. This calculation has not been validated in all clinical situations. eGFR's persistently <60 mL/min signify possible Chronic Kidney Disease.    Anion gap 6 5 -  15  Glucose, capillary     Status: None   Collection Time: 11/07/15  3:56 PM  Result Value Ref Range   Glucose-Capillary 92 65 - 99 mg/dL   Comment 1 Notify RN   Surgical pcr screen     Status: Abnormal   Collection Time: 11/07/15  6:44 PM  Result Value Ref Range   MRSA, PCR POSITIVE (A) NEGATIVE    Comment: CRITICAL RESULT CALLED TO, READ BACK BY AND VERIFIED WITH: MATT PAGE AT 2058 11/07/15 MLZ     Staphylococcus aureus POSITIVE (A) NEGATIVE    Comment:        The Xpert SA Assay (FDA approved for NASAL specimens in patients over 25 years of age), is one component of a comprehensive surveillance program.  Test performance has been validated by [facility] for patients greater than or equal to 22 year old. It is not intended to diagnose infection nor to guide or monitor treatment.   Glucose, capillary     Status: Abnormal   Collection Time: 11/07/15  8:58 PM  Result Value Ref Range   Glucose-Capillary 153 (H) 65 - 99 mg/dL   Comment 1 Notify RN   Basic metabolic panel      Status: None   Collection Time: 11/08/15  4:47 AM  Result Value Ref Range   Sodium 140 135 - 145 mmol/L   Potassium 4.0 3.5 - 5.1 mmol/L   Chloride 104 101 - 111 mmol/L   CO2 30 22 - 32 mmol/L   Glucose, Bld 97 65 - 99 mg/dL   BUN 18 6 - 20 mg/dL   Creatinine, Ser 0.73 0.44 - 1.00 mg/dL   Calcium 9.0 8.9 - 10.3 mg/dL   GFR calc non Af Amer >60 >60 mL/min   GFR calc Af Amer >60 >60 mL/min    Comment: (NOTE) The eGFR has been calculated using the CKD EPI equation. This calculation has not been validated in all clinical situations. eGFR's persistently <60 mL/min signify possible Chronic Kidney Disease.    Anion gap 6 5 - 15  CBC     Status: Abnormal   Collection Time: 11/08/15  4:47 AM  Result Value Ref Range   WBC 6.9 3.6 - 11.0 K/uL   RBC 3.65 (L) 3.80 - 5.20 MIL/uL   Hemoglobin 11.4 (L) 12.0 - 16.0 g/dL   HCT 34.5 (L) 35.0 - 47.0 %   MCV 94.5 80.0 - 100.0 fL   MCH 31.4 26.0 - 34.0 pg   MCHC 33.2 32.0 - 36.0 g/dL   RDW 13.6 11.5 - 14.5 %   Platelets 261 150 - 440 K/uL  Glucose, capillary     Status: None   Collection Time: 11/08/15  7:43 AM  Result Value Ref Range   Glucose-Capillary 96 65 - 99 mg/dL    Dg Foot 2 Views Left  11/07/2015  CLINICAL DATA:  Chronic left foot pain x3 months, 3rd tilt is red and swollen EXAM: LEFT FOOT - 2 VIEW COMPARISON:  None. FINDINGS: Suspected cortical irregularity/destructive changes involving the 3rd distal phalanx with overlying soft tissue swelling, worrisome for acute osteomyelitis. Moderate degenerative changes involving the 1st IP joint. Large plantar and posterior calcaneal enthesophytes. IMPRESSION: Cortical irregularity/destructive changes involving the 3rd distal phalanx, worrisome for acute osteomyelitis. Electronically Signed   By: Julian Hy M.D.   On: 11/07/2015 15:53    Review of Systems  Constitutional: Negative.   HENT: Negative.   Eyes: Negative.   Respiratory: Negative.   Cardiovascular:  Negative.    Gastrointestinal: Negative.   Genitourinary: Negative.   Musculoskeletal:       Some recent pain at the tip of her left third toe. Some chronic back pain  Skin:       Some redness and swelling with her left third toe. Has had some drainage from a sore on the tip of the toe  Neurological:       Relates numbness and paresthesias associated with her diabetes  Endo/Heme/Allergies: Negative.   Psychiatric/Behavioral: Negative.    Blood pressure 124/54, pulse 64, temperature 98 F (36.7 C), temperature source Oral, resp. rate 18, height '5\' 3"'  (1.6 m), weight 79.606 kg (175 lb 8 oz), SpO2 98 %. Physical Exam  Cardiovascular:  DP and PT pulses are palpable. Capillary filling time intact with exception of the left third toe  Musculoskeletal:  Adequate range of motion. Muscle testing is 5 over 5.  Neurological:  Loss of protective threshold monofilament wire distally in the toes. Proprioception is impaired  Skin:  The skin is warm dry and somewhat atrophic. Some bilateral edema. Significant erythema and edema in the left third toe with an ulceration at the distal tip    Assessment/Plan: Assessment: Osteomyelitis left third toe. Diabetes with associated neuropathy  Plan: A light dressing was applied on the left third toe to cover the ulceration. Discussed with the patient amputation of the left third toe and what this would involve. At this point we will have to confer with cardiology about timing since she is on some blood thinning medicine. Discussed risks and complications of the surgery. We will obtain informed consent and plan for surgery in the next day or 2. Nothing by mouth after midnight  Khaidyn Staebell W. 11/08/2015, 8:16 AM

## 2015-11-10 NOTE — Progress Notes (Signed)
[facility] - [place] at [facility]   PATIENT NAME: Jessica Donovan    MR#:  [id number]  DATE OF BIRTH:  25-Nov-1946  SUBJECTIVE:  CHIEF COMPLAINT:  No chief complaint on file.  Still has left foot pain. No CP/SOB. Afebrile. Surgery done earlier today  REVIEW OF SYSTEMS:  CONSTITUTIONAL: No fever, fatigue or weakness.  EYES: No blurred or double vision.  EARS, NOSE, AND THROAT: No tinnitus or ear pain.  RESPIRATORY: No cough, shortness of breath, wheezing or hemoptysis.  CARDIOVASCULAR: No chest pain, orthopnea, edema.  GASTROINTESTINAL: No nausea, vomiting, diarrhea or abdominal pain.  GENITOURINARY: No dysuria, hematuria.  ENDOCRINE: No polyuria, nocturia,  HEMATOLOGY: No anemia, easy bruising or bleeding. SKIN: No rash or lesion. MUSCULOSKELETAL: Left foot pain. NEUROLOGIC: No tingling, numbness, weakness.  PSYCHIATRY: No anxiety or depression.   DRUG ALLERGIES:   Allergies  Allergen Reactions  . Codeine Nausea And Vomiting  . Influenza Vaccines Other (See Comments)    Passed out  . Methadone Hives  . Percocet [Oxycodone-Acetaminophen] Nausea And Vomiting  . Tetanus Toxoids Swelling  . Tetracyclines & Related Rash    VITALS:  Blood pressure 95/55, pulse 55, temperature 97.7 F (36.5 C), temperature source Oral, resp. rate 16, height  (1.6 m), weight 79.606 kg (175 lb 8 oz), SpO2 99 %.  PHYSICAL EXAMINATION:  GENERAL:  69 y.o.-year-old patient lying in the bed with no acute distress.  EYES: Pupils equal, round, reactive to light and accommodation. No scleral icterus. Extraocular muscles intact.  HEENT: Head atraumatic, normocephalic. Oropharynx and nasopharynx clear.  NECK:  Supple, no jugular venous distention. No thyroid enlargement, no tenderness.  LUNGS: Normal breath sounds bilaterally, no wheezing, rales,rhonchi or crepitation. No use of accessory muscles of respiration.  CARDIOVASCULAR: S1, S2 normal. No murmurs, rubs, or  gallops.  ABDOMEN: Soft, nontender, nondistended. Bowel sounds present. No organomegaly or mass.  EXTREMITIES: No pedal edema, cyanosis, or clubbing. Left 3rd toe in dressing with tenderness. NEUROLOGIC: Cranial nerves II through XII are intact. Muscle strength 5/5 in all extremities. Sensation intact. Gait not checked.  PSYCHIATRIC: The patient is alert and oriented x 3.  SKIN: Left 3rd toe ulcer   LABORATORY PANEL:   CBC  Recent Labs Lab 11/08/15 0447  WBC 6.9  HGB 11.4*  HCT 34.5*  PLT 261   ------------------------------------------------------------------------------------------------------------------  Chemistries   Recent Labs Lab 11/07/15 1510 11/08/15 0447 11/09/15 0412  NA 138 140  --   K 4.2 4.0  --   CL 102 104  --   CO2 30 30  --   GLUCOSE 115* 97  --   BUN 22* 18  --   CREATININE 0.68 0.73 0.78  CALCIUM 9.5 9.0  --   AST 19  --   --   ALT 19  --   --   ALKPHOS 97  --   --   BILITOT 0.5  --   --    ------------------------------------------------------------------------------------------------------------------  Cardiac Enzymes No results for input(s): TROPONINI in the last 168 hours. ------------------------------------------------------------------------------------------------------------------  RADIOLOGY:  No results found.  EKG:   Orders placed or performed in visit on 12/15/14  . EKG 12-Lead    ASSESSMENT AND PLAN:   * Osteomyelitis of left 3rd phalanx.  Positive MRSA PCR On IV vanc and Zosyn Amputation done today. Cx sent.  * Coronary artery disease with recurrent angina Await cardiology input regarding timing of her cathetrization  * Diabetes Continue Lantus 20 units SQ HS and insulin  sliding scale.  * Hypertension Continue Lopressor, Lasix, imdur and lisinopril.  * Chronic pain She is on long-acting and immediate release morphine at home, will continue the same dose here in hospital.  * DVT  prophylaxis Lovenox   CODE STATUS: DO NOT RESUSCITATE  TOTAL TIME TAKING CARE OF THIS PATIENT: 35 minutes.   POSSIBLE D/C IN 2-3 DAYS, DEPENDING ON CLINICAL CONDITION.   Milagros LollSudini, Alyssa Mancera R M.D on 11/10/2015 at 1:41 PM  Between 7am to 6pm - Pager - [phone]  After 6pm go to [email or web address] - password [email or web address] [facility]  [facility]  Office  [phone]  CC: Primary care physician; Geoffery Lyonsurner, Eric M., PA

## 2015-11-10 NOTE — Interval H&P Note (Signed)
History and Physical Interval Note:  11/10/2015 7:46 AM  Jessica DanceBarbara Vickerman  has presented today for surgery, with the diagnosis of infected left 3rd toe  The various methods of treatment have been discussed with the patient and family. After consideration of risks, benefits and other options for treatment, the patient has consented to  Procedure(s): AMPUTATION TOE (Left) as a surgical intervention .  The patient's history has been reviewed, patient examined, no change in status, stable for surgery.  I have reviewed the patient's chart and labs.  Questions were answered to the patient's satisfaction.     Jessica Clendenin W.

## 2015-11-10 NOTE — Anesthesia Procedure Notes (Addendum)
Anesthesia Regional Block:  Popliteal block  Pre-Anesthetic Checklist: ,, timeout performed, Correct Patient, Correct Site, Correct Laterality, Correct Procedure, Correct Position, site marked, Risks and benefits discussed,  Surgical consent,  Pre-op evaluation,  At surgeon's request and post-op pain management   Prep: chloraprep       Needles:  Injection technique: Single-shot  Needle Type: Echogenic Needle     Needle Length: 9cm 9 cm Needle Gauge: 21 and 21 G    Additional Needles:  Procedures: ultrasound guided (picture in chart) Popliteal block Narrative:  Start time: 11/10/2015 7:32 AM End time: 11/10/2015 7:35 AM Injection made incrementally with aspirations every 5 mL.  Performed by: Personally  Anesthesiologist: Margorie JohnPISCITELLO, Kahlel Peake K  Additional Notes: Functioning IV was confirmed and monitors were applied.  A echogenic needle was used. Sterile prep,hand hygiene and sterile gloves were used.  Saphenous ring was also placed at level of the knee.  Negative aspiration and negative test dose prior to incremental administration of local anesthetic. The patient tolerated the procedure well with no immediate complications.   Procedure Name: MAC Date/Time: 11/10/2015 8:00 AM Performed by: Rosaria FerriesPISCITELLO, Edinson Domeier K Pre-anesthesia Checklist: Patient identified, Emergency Drugs available, Suction available, Patient being monitored and Timeout performed Patient Re-evaluated:Patient Re-evaluated prior to inductionOxygen Delivery Method: Simple face mask Placement Confirmation: positive ETCO2

## 2015-11-10 NOTE — Anesthesia Postprocedure Evaluation (Signed)
  Anesthesia Post-op Note  Patient: Mellody DanceBarbara Sharpnack  Procedure(s) Performed: Procedure(s): AMPUTATION TOE (Left)  Anesthesia type:MAC, Regional  Patient location: PACU  Post pain: Pain level controlled  Post assessment: Post-op Vital signs reviewed, Patient's Cardiovascular Status Stable, Respiratory Function Stable, Patent Airway and No signs of Nausea or vomiting  Post vital signs: Reviewed and stable  Last Vitals:  Filed Vitals:   11/10/15 0932  BP: 97/50  Pulse: 53  Temp: 36.8 C  Resp: 17    Level of consciousness: awake, alert  and patient cooperative  Complications: No apparent anesthesia complications

## 2015-11-10 NOTE — Progress Notes (Signed)
OR staff here to take patient to surgery via bed.

## 2015-11-10 NOTE — Progress Notes (Signed)
Patient return back from surgery, awakes to voice, 02 at 2 L per [place], left foot dressing dry/clean/intact- elevated on pillow. IV site intact right forearm.

## 2015-11-10 NOTE — Transfer of Care (Signed)
Immediate Anesthesia Transfer of Care Note  Patient: Jessica DanceBarbara Johnston  Procedure(s) Performed: Procedure(s): AMPUTATION TOE (Left)  Patient Location: PACU  Anesthesia Type:MAC  Level of Consciousness: sedated  Airway & Oxygen Therapy: Patient Spontanous Breathing and Patient connected to face mask oxygen  Post-op Assessment: Report given to RN and Post -op Vital signs reviewed and stable  Post vital signs: Reviewed and stable  Last Vitals:  Filed Vitals:   11/10/15 0344  BP: 134/66  Pulse: 54  Temp: 37 C  Resp: 18    Complications: No apparent anesthesia complications

## 2015-11-10 NOTE — Progress Notes (Signed)
Spoke with Dr.Sudini- regarding pt's blood pressure of 94/52, HR- 51. Order received to bolus 500 ml of normal saline and to hold blood pressure medication.

## 2015-11-10 NOTE — Op Note (Signed)
Date of operation: 11/10/2015.  Surgeon: Ricci Barkerodd W Noele Icenhour DPM.  Preoperative diagnosis: Osteomyelitis left third toe  Postoperative diagnosis: Same.  Procedure: Amputation left third toe.  Anesthesia: Local Mac.  Hemostasis: Pneumatic tourniquet left ankle 250 mmHg.  Estimated blood loss: Less than 5 cc.  Pathology: Left third toe.  Cultures: Bone culture for sensitivities distal phalanx left third toe.  Implants: None.  Consultations: None apparent.  Operative indications: Patient recently admitted with a infection in her left third toe. X-rays showed bone destruction changes of the distal phalanx of the third toe consistent with osteomyelitis. Decision made for amputation of the left third toe.  Operative procedure: Patient was taken the operating room and placed on the table in the supine position. A popliteal block was placed in the left leg 30 minutes prior to coming to the operative suite. Following sedation there was still noted to be some sensation plantarly so 6 cc of 1% lidocaine plain were injected around the third metatarsal area. A pneumatic tourniquet was applied at the level of the left ankle and the foot was prepped and draped in usual sterile fashion.   Attention was then directed to the distal aspect of the left foot where an elliptical incision was made coursing from dorsal to plantar around the base of the third toe. The incision was carried sharply down to the level of bone and dissection carried back periosteally to the level of the metatarsophalangeal joint where the toe was disarticulated and removed in toto. Good healthy tissues were noted. The wound was then flushed with copious amounts of sterile saline and closed using 4-0 nylon vertical mattress and simple interrupted sutures. Xeroform and a sterile gauze bandage applied to the left foot followed by Kerlix and an Ace wrap. The tourniquet was released and blood flow noted to return to the left foot and remaining  toes. Patient tolerated the procedure and anesthesia well and was transported to the PACU with vital signs stable and in good condition.

## 2015-11-10 NOTE — Anesthesia Preprocedure Evaluation (Addendum)
Anesthesia Evaluation  Patient identified by MRN, date of birth, ID band Patient awake    Reviewed: Allergy & Precautions, H&P , NPO status , Patient's Chart, lab work & pertinent test results  History of Anesthesia Complications (+) PONV and history of anesthetic complications  Airway Mallampati: III  TM Distance: >3 FB Neck ROM: limited    Dental  (+) Poor Dentition, Chipped   Pulmonary neg pulmonary ROS, neg shortness of breath,    Pulmonary exam normal breath sounds clear to auscultation       Cardiovascular Exercise Tolerance: Poor hypertension, + angina + CAD, + Past MI and +CHF  Normal cardiovascular exam Rhythm:regular Rate:Normal     Neuro/Psych CVA, Residual Symptoms negative psych ROS   GI/Hepatic negative GI ROS, Neg liver ROS,   Endo/Other  diabetes, Type 2  Renal/GU negative Renal ROS  negative genitourinary   Musculoskeletal   Abdominal   Peds  Hematology negative hematology ROS (+)   Anesthesia Other Findings Past Medical History:   Hypertension                                                 Diabetes mellitus without complication (HCC)                 Coronary artery disease                                      Low back pain                                                Stroke (HCC)                                                 Myocardial infarction (HCC)                                  Chronic back pain                                           Past Surgical History:   TOE AMPUTATION                                               BMI    Body Mass Index   31.09 kg/m 2    Patient has cardiac clearance Patient has been told that she is living on borrowed time  Patient reports numbness and weakness in the surgical leg  Reproductive/Obstetrics negative OB ROS                           Anesthesia Physical Anesthesia Plan  ASA: IV  Anesthesia Plan: MAC  and  Regional   Post-op Pain Management:    Induction:   Airway Management Planned:   Additional Equipment:   Intra-op Plan:   Post-operative Plan:   Informed Consent: I have reviewed the patients History and Physical, chart, labs and discussed the procedure including the risks, benefits and alternatives for the proposed anesthesia with the patient or authorized representative who has indicated his/her understanding and acceptance.   Dental Advisory Given  Plan Discussed with: Anesthesiologist, CRNA and Surgeon  Anesthesia Plan Comments: (Patient informed that they are higher risk for complications from anesthesia during this procedure due to their medical history.  Patient voiced understanding. )       Anesthesia Quick Evaluation

## 2015-11-11 ENCOUNTER — Inpatient Hospital Stay: Payer: Medicare Other

## 2015-11-11 ENCOUNTER — Encounter: Payer: Self-pay | Admitting: Podiatry

## 2015-11-11 LAB — CBC WITH DIFFERENTIAL/PLATELET
Basophils Absolute: 0 10*3/uL (ref 0–0.1)
Basophils Relative: 1 %
Eosinophils Absolute: 0.2 10*3/uL (ref 0–0.7)
HCT: 37.2 % (ref 35.0–47.0)
HEMOGLOBIN: 12.3 g/dL (ref 12.0–16.0)
LYMPHS ABS: 3.3 10*3/uL (ref 1.0–3.6)
MCH: 31.4 pg (ref 26.0–34.0)
MCHC: 32.9 g/dL (ref 32.0–36.0)
MCV: 95.6 fL (ref 80.0–100.0)
Monocytes Absolute: 0.8 10*3/uL (ref 0.2–0.9)
Neutro Abs: 4.5 10*3/uL (ref 1.4–6.5)
Neutrophils Relative %: 50 %
PLATELETS: 216 10*3/uL (ref 150–440)
RBC: 3.9 MIL/uL (ref 3.80–5.20)
RDW: 13.3 % (ref 11.5–14.5)
WBC: 8.9 10*3/uL (ref 3.6–11.0)

## 2015-11-11 LAB — CREATININE, SERUM: CREATININE: 0.74 mg/dL (ref 0.44–1.00)

## 2015-11-11 LAB — GLUCOSE, CAPILLARY
GLUCOSE-CAPILLARY: 141 mg/dL — AB (ref 65–99)
Glucose-Capillary: 92 mg/dL (ref 65–99)
Glucose-Capillary: 146 mg/dL — ABNORMAL HIGH (ref 65–99)
Glucose-Capillary: 174 mg/dL — ABNORMAL HIGH (ref 65–99)

## 2015-11-11 MED ORDER — ASPIRIN 81 MG PO CHEW
81.0000 mg | CHEWABLE_TABLET | ORAL | Status: AC
  Administered 2015-11-12: 81 mg via ORAL
  Filled 2015-11-11: qty 1

## 2015-11-11 MED ORDER — SODIUM CHLORIDE 0.9 % IJ SOLN
3.0000 mL | INTRAMUSCULAR | Status: DC | PRN
  Administered 2015-11-12: 10 mL via INTRAVENOUS
  Filled 2015-11-11: qty 10

## 2015-11-11 MED ORDER — SODIUM CHLORIDE 0.9 % IV SOLN: 250.0000 mL | INTRAVENOUS | Status: DC | PRN

## 2015-11-11 MED ORDER — SODIUM CHLORIDE 0.9 % WEIGHT BASED INFUSION
1.0000 mL/kg/h | INTRAVENOUS | Status: DC
  Administered 2015-11-12: 1 mL/kg/h via INTRAVENOUS

## 2015-11-11 MED ORDER — SODIUM CHLORIDE 0.9 % WEIGHT BASED INFUSION
3.0000 mL/kg/h | INTRAVENOUS | Status: DC
  Administered 2015-11-12: 3 mL/kg/h via INTRAVENOUS

## 2015-11-11 MED ORDER — DOCUSATE SODIUM 100 MG PO CAPS
200.0000 mg | ORAL_CAPSULE | Freq: Two times a day (BID) | ORAL | Status: DC
  Administered 2015-11-11 – 2015-11-12 (×3): 200 mg via ORAL
  Filled 2015-11-11 (×3): qty 2

## 2015-11-11 MED ORDER — ASPIRIN 81 MG PO CHEW
81.0000 mg | CHEWABLE_TABLET | Freq: Every day | ORAL | Status: DC
  Administered 2015-11-11 – 2015-11-12 (×2): 81 mg via ORAL
  Filled 2015-11-11 (×2): qty 1

## 2015-11-11 MED ORDER — POLYETHYLENE GLYCOL 3350 17 G PO PACK
17.0000 g | PACK | Freq: Every day | ORAL | Status: DC | PRN
Start: 2015-11-11 — End: 2015-11-12

## 2015-11-11 MED ORDER — TICAGRELOR 90 MG PO TABS
90.0000 mg | ORAL_TABLET | Freq: Two times a day (BID) | ORAL | Status: DC
  Administered 2015-11-11 – 2015-11-12 (×2): 90 mg via ORAL
  Filled 2015-11-11 (×3): qty 1

## 2015-11-11 MED ORDER — SODIUM CHLORIDE 0.9 % IJ SOLN
3.0000 mL | Freq: Two times a day (BID) | INTRAMUSCULAR | Status: DC
  Administered 2015-11-12: 3 mL via INTRAVENOUS

## 2015-11-11 MED ORDER — SULFAMETHOXAZOLE-TRIMETHOPRIM 800-160 MG PO TABS
1.0000 | ORAL_TABLET | Freq: Two times a day (BID) | ORAL | Status: DC
Start: 2015-11-11 — End: 2015-11-12
  Administered 2015-11-11 – 2015-11-12 (×3): 1 via ORAL
  Filled 2015-11-11 (×3): qty 1

## 2015-11-11 NOTE — Progress Notes (Signed)
[facility] [place] [facility]   PATIENT NAME: Jessica Donovan    MR#:  [id number]  DATE OF BIRTH:  [birth year]  SUBJECTIVE:  CHIEF COMPLAINT:  No chief complaint on file.  Still has left foot pain. No CP/SOB. Afebrile. Left 3rd phalanx amputation 11/10/2015  REVIEW OF SYSTEMS:  CONSTITUTIONAL: No fever, fatigue or weakness.  EYES: No blurred or double vision.  EARS, NOSE, AND THROAT: No tinnitus or ear pain.  RESPIRATORY: No cough, shortness of breath, wheezing or hemoptysis.  CARDIOVASCULAR: No chest pain, orthopnea, edema.  GASTROINTESTINAL: No nausea, vomiting, diarrhea or abdominal pain.  GENITOURINARY: No dysuria, hematuria.  ENDOCRINE: No polyuria, nocturia,  HEMATOLOGY: No anemia, easy bruising or bleeding. SKIN: No rash or lesion. MUSCULOSKELETAL: Left foot pain. NEUROLOGIC: No tingling, numbness, weakness.  PSYCHIATRY: No anxiety or depression.   DRUG ALLERGIES:   Allergies  Allergen Reactions  . Codeine Nausea And Vomiting  . Influenza Vaccines Other (See Comments)    Passed out  . Methadone Hives  . Percocet [Oxycodone-Acetaminophen] Nausea And Vomiting  . Tetanus Toxoids Swelling  . Tetracyclines & Related Rash    VITALS:  Blood pressure 118/62, pulse 65, temperature 98.3 F (36.8 C), temperature source Oral, resp. rate 18, height  (1.6 m), weight 79.606 kg (175 lb 8 oz), SpO2 95 %.  PHYSICAL EXAMINATION:  GENERAL:  69 y.o.-year-old patient lying in the bed with no acute distress.  EYES: Pupils equal, round, reactive to light and accommodation. No scleral icterus. Extraocular muscles intact.  HEENT: Head atraumatic, normocephalic. Oropharynx and nasopharynx clear.  NECK:  Supple, no jugular venous distention. No thyroid enlargement, no tenderness.  LUNGS: Normal breath sounds bilaterally, no wheezing, rales,rhonchi or crepitation. No use of accessory muscles of respiration.  CARDIOVASCULAR: S1, S2 normal. No murmurs,  rubs, or gallops.  ABDOMEN: Soft, nontender, nondistended. Bowel sounds present. No organomegaly or mass.  EXTREMITIES: No pedal edema, cyanosis, or clubbing. Left 3rd toe in dressing with tenderness. NEUROLOGIC: Cranial nerves II through XII are intact. Muscle strength 5/5 in all extremities. Sensation intact. Gait not checked.  PSYCHIATRIC: The patient is alert and oriented x 3.  SKIN: Left 3rd toe ulcer   LABORATORY PANEL:   CBC  Recent Labs Lab 11/11/15 0345  WBC 8.9  HGB 12.3  HCT 37.2  PLT 216   ------------------------------------------------------------------------------------------------------------------  Chemistries   Recent Labs Lab 11/07/15 1510 11/08/15 0447  11/11/15 0345  NA 138 140  --   --   K 4.2 4.0  --   --   CL 102 104  --   --   CO2 30 30  --   --   GLUCOSE 115* 97  --   --   BUN 22* 18  --   --   CREATININE 0.68 0.73  < > 0.74  CALCIUM 9.5 9.0  --   --   AST 19  --   --   --   ALT 19  --   --   --   ALKPHOS 97  --   --   --   BILITOT 0.5  --   --   --   < > = values in this interval not displayed. ------------------------------------------------------------------------------------------------------------------  Cardiac Enzymes No results for input(s): TROPONINI in the last 168 hours. ------------------------------------------------------------------------------------------------------------------  RADIOLOGY:  Dg Lumbar Spine 2-3 Views  11/11/2015  CLINICAL DATA:  Low back pain radiating to both legs. No known injury. EXAM: LUMBAR SPINE - 2-3 VIEW COMPARISON:  None. FINDINGS: There is no evidence of lumbar spine fracture. Alignment is normal. Degenerative disc disease is seen at all lumbar levels, most severe at L5-S1. Mild lumbar kyphosis also noted. Lower lumbar facet DJD noted although suboptimally visualized without oblique radiographs. No focal lytic or sclerotic bone lesions identified. IMPRESSION: No acute findings.   Degenerative  spondylosis, as described above. Electronically Signed   By: Myles RosenthalJohn  Stahl M.D.   On: 11/11/2015 13:29    EKG:   Orders placed or performed in visit on 12/15/14  . EKG 12-Lead    ASSESSMENT AND PLAN:   * Osteomyelitis of left 3rd phalanx.  Positive MRSA PCR On IV vanc and Zosyn. Stopped today. Short course of PO bactrim. S/p Amputation  * Coronary artery disease with recurrent angina Discussed with Dr. Adrienne MochaParaschoes regarding timing of catheterization. Will evaluate patient later today. On aspirin and brilinta  * Diabetes Continue Lantus 20 units SQ HS and insulin sliding scale.  * Hypertension Continue Lopressor, Lasix, imdur and lisinopril.  * Chronic pain She is on long-acting and immediate release morphine at home, will continue the same dose here in hospital.  * DVT prophylaxis Lovenox  CODE STATUS: DO NOT RESUSCITATE  TOTAL TIME TAKING CARE OF THIS PATIENT: 35 minutes.   Discharge to SNF versus home with home health. Waiting on physical therapy evaluation.   Milagros LollSudini, Alyvia Derk R M.D on 11/11/2015 at 3:13 PM  Between 7am to 6pm - Pager - [phone]  After 6pm go to [email or web address] - password [email or web address] [facility]  [facility]  Office  [phone]  CC: Primary care physician; Geoffery Lyonsurner, Eric M., PA

## 2015-11-11 NOTE — Progress Notes (Signed)
PT Cancellation Note  Patient Details Name: Jessica Donovan MRN: [id number] DOB: 20-Mar-1946   Cancelled Treatment:    Reason Eval/Treat Not Completed: Other (comment). PT reviewed patient chart and noted 3rd toe amputation secondary to osteomyelitis. No WB status provided for attempts with mobility. PT paged DPM, however no contact made currently. PT will await more definitive WB status and any recommendations for offloading equipment.   Kerin RansomPatrick A Shogo Larkey, PT, DPT    11/11/2015, 12:06 PM

## 2015-11-11 NOTE — Evaluation (Signed)
Physical Therapy Evaluation Patient Details Name: Jessica Donovan MRN: [id number] DOB: [birth year] Today's Date: 11/11/2015   History of Present Illness  Pt is a 69 y/o female that presents with suspicion of osteomyelitis and subsequent amputation of 3rd L toe. Patient has a past history of several injurious falls and a stroke leaving her with residual L sided deficits per patient.   Clinical Impression  Patient states she has had several falls recently, prior to having L 3rd toe amputation. She demonstrates severely limited gait speed and has short term memory deficits, requiring significant cuing and reminders for weight bearing status and sequencing with ambulation. Patient demonstrates excellent effort with PT in this session, though she is clearly limited in her ambulation distance as she was fatigued by this limited effort today. Patient demonstrates decreased safety with ambulation and would benefit from short term rehabilitation to increase her independence with mobility.     Follow Up Recommendations SNF    Equipment Recommendations  Rolling walker with 5" wheels    Recommendations for Other Services OT consult     Precautions / Restrictions Precautions Precautions: Fall Restrictions Weight Bearing Restrictions: Yes Other Position/Activity Restrictions: Heel touch Weight bearing with surgical shoe on.       Mobility  Bed Mobility Overal bed mobility: Modified Independent             General bed mobility comments: Patient is able to use bed rails to complete supine to sit transfer.   Transfers Overall transfer level: Needs assistance Equipment used: Rolling walker (2 wheeled) Transfers: Sit to/from Stand Sit to Stand: Min assist         General transfer comment: Patient is able to maintain WB precautions, though she struggles with sequencing and lifting herself off the bed to complete transfer.   Ambulation/Gait Ambulation/Gait assistance: Min guard Ambulation  Distance (Feet): 15 Feet Assistive device: Rolling walker (2 wheeled) Gait Pattern/deviations: Step-to pattern   Gait velocity interpretation: <1.8 ft/sec, indicative of risk for recurrent falls General Gait Details: Patient educated on step to pattern with heel touch WB only on LLE with surgical shoe on and RW. She demonstrates significantly decreased step length/gait speed and prolonged time with turns indicating she is at high risk for falling.   Stairs            Wheelchair Mobility    Modified Rankin (Stroke Patients Only)       Balance Overall balance assessment: Needs assistance Sitting-balance support: Bilateral upper extremity supported Sitting balance-Leahy Scale: Good Sitting balance - Comments: No balance deficits in sitting.    Standing balance support: Bilateral upper extremity supported Standing balance-Leahy Scale: Fair Standing balance comment: No overt loss of balance though decreased gait speed and turning speed indicates she is at high risk of future falls.                              Pertinent Vitals/Pain Pain Assessment: No/denies pain    Home Living Family/patient expects to be discharged to:: Private residence Living Arrangements: Alone   Type of Home:  (Sounds like an apartment style ALF? Unclear from patient report) Home Access: Stairs to enter   [employer] of Steps: 2 steps per patient Home Layout: One level Home Equipment: Cane - single point      Prior Function Level of Independence: Independent with assistive device(s)         Comments: Patient reprots several falls with SPC, she fell  last weekend and attempted to get up x 7 times unsucessfully.      Hand Dominance        Extremity/Trunk Assessment   Upper Extremity Assessment: Overall WFL for tasks assessed           Lower Extremity Assessment: Overall WFL for tasks assessed         Communication   Communication: No difficulties (Short  term memory loss. )  Cognition Arousal/Alertness: Awake/alert Behavior During Therapy: WFL for tasks assessed/performed;Flat affect Overall Cognitive Status: Difficult to assess                      General Comments      Exercises        Assessment/Plan    PT Assessment Patient needs continued PT services  PT Diagnosis Difficulty walking;Abnormality of gait   PT Problem List Decreased strength;Decreased mobility;Decreased safety awareness;Decreased activity tolerance;Cardiopulmonary status limiting activity;Decreased knowledge of use of DME;Decreased balance  PT Treatment Interventions DME instruction;Therapeutic activities;Therapeutic exercise;Gait training;Stair training;Balance training;Neuromuscular re-education   PT Goals (Current goals can be found in the Care Plan section) Acute Rehab PT Goals Patient Stated Goal: To find a different living facility.  PT Goal Formulation: With patient Time For Goal Achievement: 11/25/15 Potential to Achieve Goals: Good    Frequency 7X/week   Barriers to discharge Decreased caregiver support Patient lives alone, and has a history of falls.     Co-evaluation               End of Session Equipment Utilized During Treatment: Gait belt Activity Tolerance: Patient tolerated treatment well;Patient limited by fatigue Patient left: in chair;with chair alarm set;with call bell/phone within reach Nurse Communication: Mobility status         Time: [id number] PT Time Calculation (min) (ACUTE ONLY): 23 min   Charges:   PT Evaluation $Initial PT Evaluation Tier I: 1 Procedure     PT G Codes:       Kerin RansomPatrick A McNamara, PT, DPT    11/11/2015, 4:54 PM

## 2015-11-11 NOTE — Progress Notes (Signed)
Spoke with Dr. Clint GuyHower. Pt had cardiac catheterization tomorrow morning and Lovenox is scheduled. Md order to hold for tonight.

## 2015-11-11 NOTE — Care Management (Signed)
Surgical shoe, heel touch with walker per DR. Cline.

## 2015-11-11 NOTE — [place] FL2 (Signed)
[place] MEDICAID FL2 LEVEL OF CARE SCREENING TOOL     IDENTIFICATION  Patient Name: Jessica Donovan Birthdate: 06-22-1946 Sex: female Admission Date (Current Location): 11/07/2015  [place] and [place] Number:  Air cabin crew )   Facility and Address:  [facility], [address], [place], [place] [address]      Provider Number: [id number]  Attending Physician Name and Address:  Milagros Loll, MD  Relative Name and Phone Number:       Current Level of Care: Hospital Recommended Level of Care: Skilled Nursing Facility Prior Approval Number:    Date Approved/Denied:   PASRR Number:  (  [id number] )  Discharge Plan: SNF    Current Diagnoses: Patient Active Problem List   Diagnosis Date Noted  . 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 Angina effort (HCC) 11/07/2015  . Osteomyelitis (HCC) 11/07/2015    Orientation ACTIVITIES/SOCIAL BLADDER RESPIRATION    Self, Time, Situation, Place  Active Continent Normal  BEHAVIORAL SYMPTOMS/MOOD NEUROLOGICAL BOWEL NUTRITION STATUS   (none )  (none ) Continent Diet (Diet: Carb Modified )  PHYSICIAN VISITS COMMUNICATION OF NEEDS Height & Weight Skin  30 days Verbally  (160 cm) 175 lbs. Surgical wounds (Incision: Left Foot. )          AMBULATORY STATUS RESPIRATION    Assist extensive Normal      Personal Care Assistance Level of Assistance  Bathing, Feeding, Dressing Bathing Assistance: Limited assistance Feeding assistance: Independent Dressing Assistance: Limited assistance      Functional Limitations Info  Sight, Hearing, Speech Sight Info: Impaired Hearing Info: Adequate Speech Info: Adequate       SPECIAL CARE FACTORS FREQUENCY  PT (By licensed PT)     PT Frequency:  (5)             Additional Factors Info  Code Status, Allergies, Psychotropic, Insulin Sliding Scale, Isolation Precautions Code Status Info:  (DNR) Allergies Info:  (Codeine, Influenza Vaccines, Methadone,  Percocet Oxycodone-acetaminophen, Tetanus Toxoids, Tetracyclines & Related. ) Psychotropic Info:  (Xanax) Insulin Sliding Scale Info:  (Novolog Insulin Injections 3 times daily. ) Isolation Precautions Info:  (MRSA nasal swab. )     Current Medications (11/11/2015): Current Facility-Administered Medications  Medication Dose Route Frequency Provider Last Rate Last Dose  . 0.9 %  sodium chloride infusion  250 mL Intravenous PRN Linus Galas, MD      . ALPRAZolam Prudy Feeler) tablet 1 mg  1 mg Oral TID PRN Altamese Dilling, MD   1 mg at 11/11/15 0823  . atorvastatin (LIPITOR) tablet 20 mg  20 mg Oral Daily Altamese Dilling, MD   20 mg at 11/11/15 1036  . Chlorhexidine Gluconate Cloth 2 % PADS 6 each  6 each Topical Q0600 Milagros Loll, MD   6 each at 11/11/15 [phone]  . diphenoxylate-atropine (LOMOTIL) 2.5-0.025 MG per tablet 1 tablet  1 tablet Oral QID PRN Altamese Dilling, MD      . enoxaparin (LOVENOX) injection 40 mg  40 mg Subcutaneous Q24H Milagros Loll, MD   40 mg at 11/10/15 2118  . ferrous sulfate tablet 325 mg  325 mg Oral Q breakfast Altamese Dilling, MD   325 mg at 11/11/15 0823  . fesoterodine (TOVIAZ) tablet 4 mg  4 mg Oral Daily Altamese Dilling, MD   4 mg at 11/11/15 1036  . furosemide (LASIX) tablet 20 mg  20 mg Oral Daily Altamese Dilling, MD   20 mg at 11/11/15 1036  . gabapentin (NEURONTIN) capsule 100 mg  100  mg Oral BID Altamese Dilling, MD   100 mg at 11/11/15 1036  . insulin aspart (novoLOG) injection 0-9 Units  0-9 Units Subcutaneous TID WC Altamese Dilling, MD   2 Units at 11/09/15 1201  . insulin glargine (LANTUS) injection 20 Units  20 Units Subcutaneous QHS Altamese Dilling, MD   20 Units at 11/10/15 2218  . isosorbide mononitrate (IMDUR) 24 hr tablet 30 mg  30 mg Oral Daily Altamese Dilling, MD   30 mg at 11/11/15 1036  . metoprolol (LOPRESSOR) tablet 50 mg  50 mg Oral BID Altamese Dilling, MD   50 mg at 11/11/15 1036  .  mirtazapine (REMERON) tablet 15 mg  15 mg Oral QHS Altamese Dilling, MD   15 mg at 11/10/15 2110  . morphine (MS CONTIN) 12 hr tablet 30 mg  30 mg Oral TID Altamese Dilling, MD   30 mg at 11/11/15 0524  . morphine (MSIR) tablet 15 mg  15 mg Oral QID Altamese Dilling, MD   15 mg at 11/11/15 1037  . mupirocin ointment (BACTROBAN) 2 % 1 application  1 application Nasal BID Milagros Loll, MD   1 application at 11/11/15 1040  . nitroGLYCERIN (NITROSTAT) SL tablet 0.4 mg  0.4 mg Sublingual Q5 min PRN Altamese Dilling, MD      . pantoprazole (PROTONIX) EC tablet 40 mg  40 mg Oral Daily Altamese Dilling, MD   40 mg at 11/11/15 0823  . pregabalin (LYRICA) capsule 50 mg  50 mg Oral BID Altamese Dilling, MD   50 mg at 11/11/15 1036  . sodium chloride 0.9 % injection 3 mL  3 mL Intravenous Q12H Linus Galas, MD   3 mL at 11/11/15 1040  . sodium chloride 0.9 % injection 3 mL  3 mL Intravenous PRN Linus Galas, MD      . tiZANidine (ZANAFLEX) tablet 4 mg  4 mg Oral TID Altamese Dilling, MD   4 mg at 11/11/15 1036  . traZODone (DESYREL) tablet 100 mg  100 mg Oral QHS PRN Altamese Dilling, MD   100 mg at 11/10/15 2112  . venlafaxine XR (EFFEXOR-XR) 24 hr capsule 150 mg  150 mg Oral Q breakfast Altamese Dilling, MD   150 mg at 11/11/15 1610   Do not use this list as official medication orders. Please verify with discharge summary.  Discharge Medications:   Medication List    ASK your doctor about these medications        ALPRAZolam 1 MG tablet  Commonly known as:  XANAX  Take 1 mg by mouth 3 (three) times daily as needed for anxiety.     amLODipine 5 MG tablet  Commonly known as:  NORVASC  Take 5 mg by mouth.     amoxicillin-clavulanate 875-125 MG tablet  Commonly known as:  AUGMENTIN  Take 1 tablet by mouth 2 (two) times daily.     aspirin EC 81 MG tablet  Take 81 mg by mouth daily.     atorvastatin 20 MG tablet  Commonly known as:  LIPITOR  Take 20 mg  by mouth daily.     diphenoxylate-atropine 2.5-0.025 MG tablet  Commonly known as:  LOMOTIL  Take 1 tablet by mouth 4 (four) times daily as needed for diarrhea or loose stools.     ergocalciferol 50000 UNITS capsule  Commonly known as:  VITAMIN D2  Take 50,000 Units by mouth once a week.     ferrous sulfate 325 (65 FE) MG tablet  Take 325 mg by  mouth daily with breakfast.     furosemide 20 MG tablet  Commonly known as:  LASIX  Take 20 mg by mouth daily.     gabapentin 100 MG capsule  Commonly known as:  NEURONTIN  Take 100 mg by mouth.     insulin aspart protamine- aspart (70-30) 100 UNIT/ML injection  Commonly known as:  NOVOLOG MIX 70/30  Inject into the skin 2 (two) times daily with a meal.     insulin glargine 100 UNIT/ML injection  Commonly known as:  LANTUS  Inject 50 Units into the skin at bedtime.     isosorbide mononitrate 30 MG 24 hr tablet  Commonly known as:  IMDUR  Take 30 mg by mouth daily.     liothyronine 25 MCG tablet  Commonly known as:  CYTOMEL  Take 25 mcg by mouth daily.     lisinopril 20 MG tablet  Commonly known as:  PRINIVIL,ZESTRIL  Take 20 mg by mouth daily.     metoprolol 50 MG tablet  Commonly known as:  LOPRESSOR  Take 50 mg by mouth 2 (two) times daily.     mirtazapine 15 MG tablet  Commonly known as:  REMERON  Take 15 mg by mouth at bedtime.     morphine 30 MG 12 hr tablet  Commonly known as:  MS CONTIN  Take 30 mg by mouth 3 (three) times daily.     morphine 15 MG tablet  Commonly known as:  MSIR  Take 15 mg by mouth 4 (four) times daily.     nitrofurantoin 100 MG capsule  Commonly known as:  MACRODANTIN  Take 100 mg by mouth 2 (two) times daily.     nitroGLYCERIN 0.4 MG SL tablet  Commonly known as:  NITROSTAT  Place 0.4 mg under the tongue every 5 (five) minutes as needed for chest pain.     omeprazole 20 MG capsule  Commonly known as:  PRILOSEC  Take 20 mg by mouth daily.     pregabalin 75 MG capsule  Commonly  known as:  LYRICA  Take 75 mg by mouth 2 (two) times daily.     solifenacin 10 MG tablet  Commonly known as:  VESICARE  Take by mouth daily.     ticagrelor 90 MG Tabs tablet  Commonly known as:  BRILINTA  Take 90 mg by mouth 2 (two) times daily.     tiZANidine 4 MG tablet  Commonly known as:  ZANAFLEX  Take 4 mg by mouth 3 (three) times daily.     tolterodine 4 MG 24 hr capsule  Commonly known as:  DETROL LA  Take 4 mg by mouth daily.     traZODone 100 MG tablet  Commonly known as:  DESYREL  Take 100 mg by mouth at bedtime as needed for sleep.     venlafaxine XR 150 MG 24 hr capsule  Commonly known as:  EFFEXOR-XR  Take 150 mg by mouth daily with breakfast.        Relevant Imaging Results:  Relevant Lab Results:  Recent Labs    Additional Information  (SSN: [id number])  Haig ProphetMorgan, Jaquel Glassburn G, LCSW

## 2015-11-11 NOTE — Progress Notes (Signed)
1 Day Post-Op  Subjective: Patient seen. No real pain with her foot. Still has significant complaint of back pain ever since she fell at home.  Objective: Vital signs in last 24 hours: Temp:  [96.1 F (35.6 C)-98.5 F (36.9 C)] 98.2 F (36.8 C) (11/21 0427) Pulse Rate:  [51-65] 63 (11/21 0427) Resp:  [0-18] 18 (11/21 0427) BP: (94-146)/(44-60) 146/44 mmHg (11/21 0427) SpO2:  [88 %-100 %] 96 % (11/21 0427) Last BM Date: 11/08/15  Intake/Output from previous day: 11/20 0701 - 11/21 0700 In: 418 [I.V.:418] Out: 2150 [Urine:2150] Intake/Output this shift:    Incision/Wound: The bandages dry and intact. Upon removal there is minimal bleeding on the bandage. The incision is well coapted at the amputation site of the left third toe. Mild erythema but no active drainage or purulence.  Lab Results:   Recent Labs  11/11/15 0345  WBC 8.9  HGB 12.3  HCT 37.2  PLT 216   BMET  Recent Labs  11/09/15 0412 11/11/15 0345  CREATININE 0.78 0.74   PT/INR No results for input(s): LABPROT, INR in the last 72 hours. ABG No results for input(s): PHART, HCO3 in the last 72 hours.  Invalid input(s): PCO2, PO2  Studies/Results: No results found.  Anti-infectives: Anti-infectives    Start     Dose/Rate Route Frequency Ordered Stop   11/11/15 0100  vancomycin (VANCOCIN) IVPB 1000 mg/200 mL premix     1,000 mg 200 mL/hr over 60 Minutes Intravenous Every 12 hours 11/10/15 1531     11/09/15 0300  vancomycin (VANCOCIN) IVPB 750 mg/150 ml premix  Status:  Discontinued     750 mg 150 mL/hr over 60 Minutes Intravenous Every 12 hours 11/08/15 1358 11/10/15 1531   11/08/15 1500  vancomycin (VANCOCIN) 1,250 mg in sodium chloride 0.9 % 250 mL IVPB     1,250 mg 166.7 mL/hr over 90 Minutes Intravenous  Once 11/08/15 1355 11/08/15 1730   11/08/15 1500  piperacillin-tazobactam (ZOSYN) IVPB 3.375 g     3.375 g 12.5 mL/hr over 240 Minutes Intravenous 3 times per day 11/08/15 1402     11/07/15  1730  ampicillin-sulbactam (UNASYN) 1.5 g in sodium chloride 0.9 % 50 mL IVPB  Status:  Discontinued     1.5 g 100 mL/hr over 30 Minutes Intravenous Every 8 hours 11/07/15 1512 11/08/15 1351      Assessment/Plan: s/p Procedure(s): AMPUTATION TOE (Left) Assessment: 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 The patient was instructed to leave the bandage intact and keep it clean and dry. We will follow up with the patient next week outpatient for reevaluation. Stable for discharge when appropriate as far as her foot is concerned. She does have a heart catheterization scheduled for Wednesday and decision will be made whether to keep her inpatient until that procedure.  LOS: 4 days    Bryn Saline W. 11/11/2015

## 2015-11-12 ENCOUNTER — Ambulatory Visit: Admission: RE | Admit: 2015-11-12 | Payer: Medicare Other | Source: Ambulatory Visit | Admitting: Cardiology

## 2015-11-12 ENCOUNTER — Encounter
Admission: AD | Disposition: A | Discharge status: Skilled Nursing Facility | Payer: Self-pay | Source: Ambulatory Visit | Attending: Internal Medicine

## 2015-11-12 ENCOUNTER — Encounter: Payer: Self-pay | Admitting: Cardiology

## 2015-11-12 HISTORY — PX: CARDIAC CATHETERIZATION: SHX172

## 2015-11-12 LAB — SURGICAL PATHOLOGY

## 2015-11-12 LAB — PROTIME-INR
INR: 1.14
PROTHROMBIN TIME: 14.8 s (ref 11.4–15.0)

## 2015-11-12 LAB — GLUCOSE, CAPILLARY
GLUCOSE-CAPILLARY: 108 mg/dL — AB (ref 65–99)
Glucose-Capillary: 94 mg/dL (ref 65–99)
Glucose-Capillary: 140 mg/dL — ABNORMAL HIGH (ref 65–99)

## 2015-11-12 SURGERY — LEFT HEART CATH
Anesthesia: Moderate Sedation

## 2015-11-12 MED ORDER — SULFAMETHOXAZOLE-TRIMETHOPRIM 800-160 MG PO TABS: 1.0000 | ORAL_TABLET | Freq: Two times a day (BID) | ORAL | Status: DC

## 2015-11-12 MED ORDER — METOPROLOL TARTRATE 25 MG PO TABS: 12.5000 mg | ORAL_TABLET | Freq: Two times a day (BID) | ORAL | Status: DC

## 2015-11-12 MED ORDER — INSULIN GLARGINE 100 UNIT/ML ~~LOC~~ SOLN: 25.0000 [IU] | Freq: Every day | SUBCUTANEOUS | Status: DC

## 2015-11-12 MED ORDER — AMOXICILLIN-POT CLAVULANATE 875-125 MG PO TABS: 1.0000 | ORAL_TABLET | Freq: Two times a day (BID) | ORAL | Status: DC

## 2015-11-12 MED ORDER — FENTANYL CITRATE (PF) 100 MCG/2ML IJ SOLN
INTRAMUSCULAR | Status: DC | PRN
  Administered 2015-11-12: 50 ug via INTRAVENOUS

## 2015-11-12 MED ORDER — MIDAZOLAM HCL 2 MG/2ML IJ SOLN
INTRAMUSCULAR | Status: DC | PRN
  Administered 2015-11-12: 1 mg via INTRAVENOUS

## 2015-11-12 MED ORDER — PREGABALIN 75 MG PO CAPS: 75.0000 mg | ORAL_CAPSULE | Freq: Two times a day (BID) | ORAL | Status: DC

## 2015-11-12 MED ORDER — MIDAZOLAM HCL 2 MG/2ML IJ SOLN
INTRAMUSCULAR | Status: AC
  Filled 2015-11-12: qty 2

## 2015-11-12 MED ORDER — IOHEXOL 300 MG/ML  SOLN
INTRAMUSCULAR | Status: DC | PRN
  Administered 2015-11-12: 100 mL via INTRA_ARTERIAL

## 2015-11-12 MED ORDER — METOPROLOL TARTRATE 25 MG PO TABS
25.0000 mg | ORAL_TABLET | Freq: Two times a day (BID) | ORAL | Status: DC
  Administered 2015-11-12: 25 mg via ORAL
  Filled 2015-11-12: qty 1

## 2015-11-12 MED ORDER — MORPHINE SULFATE ER 30 MG PO TBCR: 30.0000 mg | EXTENDED_RELEASE_TABLET | Freq: Three times a day (TID) | ORAL | Status: DC

## 2015-11-12 MED ORDER — ALPRAZOLAM 1 MG PO TABS: 1.0000 mg | ORAL_TABLET | Freq: Two times a day (BID) | ORAL | Status: DC | PRN

## 2015-11-12 MED ORDER — MORPHINE SULFATE 15 MG PO TABS: 15.0000 mg | ORAL_TABLET | Freq: Four times a day (QID) | ORAL | Status: DC | PRN

## 2015-11-12 MED ORDER — FENTANYL CITRATE (PF) 100 MCG/2ML IJ SOLN
INTRAMUSCULAR | Status: AC
  Filled 2015-11-12: qty 2

## 2015-11-12 MED ORDER — HEPARIN (PORCINE) IN NACL 2-0.9 UNIT/ML-% IJ SOLN
INTRAMUSCULAR | Status: AC
  Filled 2015-11-12: qty 1000

## 2015-11-12 SURGICAL SUPPLY — 9 items
CATH INFINITI 5FR ANG PIGTAIL (CATHETERS) ×3 IMPLANT
CATH INFINITI 5FR JL4 (CATHETERS) ×3 IMPLANT
CATH INFINITI JR4 5F (CATHETERS) ×3 IMPLANT
DEVICE CLOSURE MYNXGRIP 5F (Vascular Products) ×3 IMPLANT
KIT MANI 3VAL PERCEP (MISCELLANEOUS) ×3 IMPLANT
NEEDLE PERC 18GX7CM (NEEDLE) ×3 IMPLANT
PACK CARDIAC CATH (CUSTOM PROCEDURE TRAY) ×3 IMPLANT
SHEATH AVANTI 5FR X 11CM (SHEATH) ×3 IMPLANT
WIRE EMERALD 3MM-J .035X150CM (WIRE) ×3 IMPLANT

## 2015-11-12 NOTE — Progress Notes (Signed)
Returned from cath lab, pressure dressing rt groin dry intact pt assissted to bathroom

## 2015-11-12 NOTE — Progress Notes (Signed)
[facility]

## 2015-11-12 NOTE — Progress Notes (Signed)
Pt transported to cath lab for cardiac cath

## 2015-11-12 NOTE — Clinical Social Work Placement (Signed)
   CLINICAL SOCIAL WORK PLACEMENT  NOTE  Date:  11/12/2015  Patient Details  Name: Mellody DanceBarbara Rabbani MRN: [id number] Date of Birth: 04-03-1946  Clinical Social Work is seeking post-discharge placement for this patient at the Skilled  Nursing Facility level of care (*CSW will initial, date and re-position this form in  chart as items are completed):  Yes   Patient/family provided with [facility] Clinical Social Work Department's list of facilities offering this level of care within the geographic area requested by the patient (or if unable, by the patient's family).  Yes   Patient/family informed of their freedom to choose among providers that offer the needed level of care, that participate in Medicare, Medicaid or managed care program needed by the patient, have an available bed and are willing to accept the patient.  Yes   Patient/family informed of [facility]'s ownership interest in [facility] and [facility], as well as of the fact that they are under no obligation to receive care at these facilities.  PASRR submitted to EDS on       PASRR number received on       Existing PASRR number confirmed on 11/12/15     FL2 transmitted to all facilities in geographic area requested by pt/family on 11/12/15     FL2 transmitted to all facilities within larger geographic area on 11/12/15     Patient informed that his/her managed care company has contracts with or will negotiate with certain facilities, including the following:            Patient/family informed of bed offers received.  Patient chooses bed at       Physician recommends and patient chooses bed at      Patient to be transferred to   on  .  Patient to be transferred to facility by       Patient family notified on   of transfer.  Name of family member notified:        PHYSICIAN Please sign FL2     Additional Comment:    _______________________________________________ Haig ProphetMorgan, Erwin Nishiyama G, LCSW 11/12/2015,  11:52 AM

## 2015-11-12 NOTE — Progress Notes (Signed)
Auto BIPAP placed on patient due to apneic spells.  HR 56 RR 18 oxygen saturations 100%. No adverse reactions noted.

## 2015-11-12 NOTE — Progress Notes (Signed)
Clinical Child psychotherapistocial Worker (CSW) presented bed offers. Patient [facility] [place]. [facility] is [facility] to private room 117. RN will call report to RN [facility]. [facility] will provide transport. [facility] cannot [facility] today per [employer] crew chief. CSW faxed D/C Summary to [facility]. Patient is aware of above. CSW spoke with patient's friend HPAO [name] via telephone and made her aware of above. Please reconsult if future social work needs arise. CSW signing off.   Jetta LoutBailey Morgan, LCSWA [phone]

## 2015-11-12 NOTE — Discharge Instructions (Signed)
°  DIET:  Diabetic diet  DISCHARGE CONDITION:  Stable  ACTIVITY:  Activity as tolerated using surgical boot  OXYGEN:  Home Oxygen: No.   Oxygen Delivery: room air  DISCHARGE LOCATION:  nursing home   If you experience worsening of your admission symptoms, develop shortness of breath, life threatening emergency, suicidal or homicidal thoughts you must seek medical attention immediately by calling 911 or calling your MD immediately  if symptoms less severe.  You Must read complete instructions/literature along with all the possible adverse reactions/side effects for all the Medicines you take and that have been prescribed to you. Take any new Medicines after you have completely understood and accpet all the possible adverse reactions/side effects.   Please note  You were cared for by a hospitalist during your hospital stay. If you have any questions about your discharge medications or the care you received while you were in the hospital after you are discharged, you can call the unit and asked to speak with the hospitalist on call if the hospitalist that took care of you is not available. Once you are discharged, your primary care physician will handle any further medical issues. Please note that NO REFILLS for any discharge medications will be authorized once you are discharged, as it is imperative that you return to your primary care physician (or establish a relationship with a primary care physician if you do not have one) for your aftercare needs so that they can reassess your need for medications and monitor your lab values.

## 2015-11-12 NOTE — Discharge Summary (Signed)
[facility] - [place] at [facility]   PATIENT NAME: Jessica Donovan    MR#:  [id number]  DATE OF BIRTH:  27-Jun-1946  DATE OF ADMISSION:  11/07/2015 ADMITTING PHYSICIAN: Altamese Dilling, MD  DATE OF DISCHARGE: No discharge date for patient encounter.  PRIMARY CARE PHYSICIAN: Geoffery Lyons., PA    ADMISSION DIAGNOSIS:  Osteomyelitis Cellulitis infected left 3rd toe Chest Pain Abnormal Functional Study  DISCHARGE DIAGNOSIS:  Principal Problem:   Diabetic osteomyelitis (HCC) Active Problems:   Angina effort (HCC)   Osteomyelitis (HCC)   SECONDARY DIAGNOSIS:   Past Medical History  Diagnosis Date  . Hypertension   . Diabetes mellitus without complication (HCC)   . Coronary artery disease   . Low back pain   . Stroke (HCC)   . Myocardial infarction (HCC)   . Chronic back pain      ADMITTING HISTORY   HISTORY OF PRESENT ILLNESS: Jessica Donovan is a 69 y.o. female with a known history of hypertension, diabetes, coronary artery disease, stroke, chronic back pain- has been having redness and swelling with some pain on her left side third toe, she has been trying to do dressing him take some pain medications at home on her own but it was not resolving. Finally yesterday C spoke to her primary care doctor Dareen Piano about that, he referred her to podiatry Dr. Odessa Fleming office. Today patient went over there, and Dr. Alberteen Spindle suspected she has osteomyelitis and she need amputation of the toe but because of her cardiac history and being on some antiplatelet agents she needed to be off from those and started on antibiotics in hospital- so sent her for direct admission.  On further questioning patient also told me in hospital that for last few weeks to month she has been having chest pain on and off in center of her chest with even minimal activity like getting up to change the radio station or to pick up something in the house, but she does not have the pain when  she is just lying down or sitting. She also went to Dr. Darrold Junker office yesterday- and he told her we will do a cardiac catheterization as outpatient within next few days so he was working on that.   HOSPITAL COURSE:   * Osteomyelitis of left 3rd phalanx. Positive MRSA PCR On IV vanc and Zosyn. Stopped after amputation. Short course of PO bactrim after discharge for 3 days S/p Amputation  * Coronary artery disease with recurrent angina Cardiac catheterization showed no significant blockage. Patent stents.  * Diabetes Continue Lantus 25 units SQ HS and insulin sliding scale.  * Hypertension Continue Lopressor, Lasix, imdur and lisinopril.  * Chronic pain She is on long-acting and immediate release morphine at home, continued the same dose here in hospital.  * DVT prophylaxis Lovenox given during the hospital stay  Stable for discharge to skilled nursing facility.   CONSULTS OBTAINED:  Treatment Team:  Dalia Heading, MD Alwyn Pea, MD Linus Galas, MD Marcina Millard, MD  DRUG ALLERGIES:   Allergies  Allergen Reactions  . Codeine Nausea And Vomiting  . Influenza Vaccines Other (See Comments)    Passed out  . Methadone Hives  . Percocet [Oxycodone-Acetaminophen] Nausea And Vomiting  . Tetanus Toxoids Swelling  . Tetracyclines & Related Rash    DISCHARGE MEDICATIONS:   Current Discharge Medication List    START taking these medications   Details  sulfamethoxazole-trimethoprim (BACTRIM DS,SEPTRA DS) 800-160 MG tablet Take 1 tablet  by mouth every 12 (twelve) hours. Qty: 6 tablet, Refills: 0      CONTINUE these medications which have CHANGED   Details  ALPRAZolam (XANAX) 1 MG tablet Take 1 tablet (1 mg total) by mouth 2 (two) times daily as needed for anxiety. Qty: 20 tablet, Refills: 0    insulin glargine (LANTUS) 100 UNIT/ML injection Inject 0.25 mLs (25 Units total) into the skin at bedtime. Qty: 10 mL, Refills: 11    metoprolol (LOPRESSOR)  25 MG tablet Take 0.5 tablets (12.5 mg total) by mouth 2 (two) times daily.    morphine (MS CONTIN) 30 MG 12 hr tablet Take 1 tablet (30 mg total) by mouth 3 (three) times daily. Qty: 30 tablet, Refills: 0    morphine (MSIR) 15 MG tablet Take 1 tablet (15 mg total) by mouth every 6 (six) hours as needed for severe pain. Qty: 30 tablet, Refills: 0      CONTINUE these medications which have NOT CHANGED   Details  aspirin EC 81 MG tablet Take 81 mg by mouth daily.    atorvastatin (LIPITOR) 20 MG tablet Take 20 mg by mouth daily.    diphenoxylate-atropine (LOMOTIL) 2.5-0.025 MG tablet Take 1 tablet by mouth 4 (four) times daily as needed for diarrhea or loose stools.    ergocalciferol (VITAMIN D2) 50000 UNITS capsule Take 50,000 Units by mouth once a week.    ferrous sulfate 325 (65 FE) MG tablet Take 325 mg by mouth daily with breakfast.    furosemide (LASIX) 20 MG tablet Take 20 mg by mouth daily.    gabapentin (NEURONTIN) 100 MG capsule Take 100 mg by mouth.    isosorbide mononitrate (IMDUR) 30 MG 24 hr tablet Take 30 mg by mouth daily.    liothyronine (CYTOMEL) 25 MCG tablet Take 25 mcg by mouth daily.    lisinopril (PRINIVIL,ZESTRIL) 20 MG tablet Take 20 mg by mouth daily.    mirtazapine (REMERON) 15 MG tablet Take 15 mg by mouth at bedtime.    nitroGLYCERIN (NITROSTAT) 0.4 MG SL tablet Place 0.4 mg under the tongue every 5 (five) minutes as needed for chest pain.    omeprazole (PRILOSEC) 20 MG capsule Take 20 mg by mouth daily.    pregabalin (LYRICA) 75 MG capsule Take 75 mg by mouth 2 (two) times daily.    solifenacin (VESICARE) 10 MG tablet Take by mouth daily.    ticagrelor (BRILINTA) 90 MG TABS tablet Take 90 mg by mouth 2 (two) times daily.    tiZANidine (ZANAFLEX) 4 MG tablet Take 4 mg by mouth 3 (three) times daily.    tolterodine (DETROL LA) 4 MG 24 hr capsule Take 4 mg by mouth daily.    traZODone (DESYREL) 100 MG tablet Take 100 mg by mouth at bedtime as  needed for sleep.    venlafaxine XR (EFFEXOR-XR) 150 MG 24 hr capsule Take 150 mg by mouth daily with breakfast.      STOP taking these medications     amLODipine (NORVASC) 5 MG tablet      insulin aspart protamine- aspart (NOVOLOG MIX 70/30) (70-30) 100 UNIT/ML injection      nitrofurantoin (MACRODANTIN) 100 MG capsule      amoxicillin-clavulanate (AUGMENTIN) 875-125 MG tablet          Today    VITAL SIGNS:  Blood pressure 130/70, pulse 60, temperature 98.4 F (36.9 C), temperature source Oral, resp. rate 18, height 5\' 3"  (1.6 m), weight 79.379 kg (175 lb), SpO2 96 %.  I/O:   Intake/Output Summary (Last 24 hours) at 11/12/15 1326 Last data filed at 11/12/15 0700  Gross per 24 hour  Intake 623.28 ml  Output    950 ml  Net -326.72 ml    PHYSICAL EXAMINATION:  Physical Exam  GENERAL:  69 y.o.-year-old patient lying in the bed with no acute distress.  LUNGS: Normal breath sounds bilaterally, no wheezing, rales,rhonchi or crepitation. No use of accessory muscles of respiration.  CARDIOVASCULAR: S1, S2 normal. No murmurs, rubs, or gallops.  ABDOMEN: Soft, non-tender, non-distended. Bowel sounds present. No organomegaly or mass.  NEUROLOGIC: Moves all 4 extremities. PSYCHIATRIC: The patient is alert and oriented x 3.  Left 3rd toe dressing. No discharge  DATA REVIEW:   CBC  Recent Labs Lab 11/11/15 0345  WBC 8.9  HGB 12.3  HCT 37.2  PLT 216    Chemistries   Recent Labs Lab 11/07/15 1510 11/08/15 0447  11/11/15 0345  NA 138 140  --   --   K 4.2 4.0  --   --   CL 102 104  --   --   CO2 30 30  --   --   GLUCOSE 115* 97  --   --   BUN 22* 18  --   --   CREATININE 0.68 0.73  < > 0.74  CALCIUM 9.5 9.0  --   --   AST 19  --   --   --   ALT 19  --   --   --   ALKPHOS 97  --   --   --   BILITOT 0.5  --   --   --   < > = values in this interval not displayed.  Cardiac Enzymes No results for input(s): TROPONINI in the last 168 hours.  Microbiology  Results  Results for orders placed or performed during the hospital encounter of 11/07/15  Surgical pcr screen     Status: Abnormal   Collection Time: 11/07/15  6:44 PM  Result Value Ref Range Status   MRSA, PCR POSITIVE (A) NEGATIVE Final    Comment: CRITICAL RESULT CALLED TO, READ BACK BY AND VERIFIED WITH: MATT PAGE AT 2058 11/07/15 MLZ     Staphylococcus aureus POSITIVE (A) NEGATIVE Final    Comment:        The Xpert SA Assay (FDA approved for NASAL specimens in patients over 71 years of age), is one component of a comprehensive surveillance program.  Test performance has been validated by [facility] for patients greater than or equal to 5 year old. It is not intended to diagnose infection nor to guide or monitor treatment.   Anaerobic culture     Status: None (Preliminary result)   Collection Time: 11/10/15  8:15 AM  Result Value Ref Range Status   Specimen Description WOUND  Final   Special Requests NONE  Final   Culture   Final    NO ANAEROBES ISOLATED; CULTURE IN PROGRESS FOR 5 DAYS   Report Status PENDING  Incomplete  Wound culture     Status: None (Preliminary result)   Collection Time: 11/10/15  8:15 AM  Result Value Ref Range Status   Specimen Description WOUND  Final   Special Requests NONE  Final   Gram Stain FEW WBC SEEN RARE GRAM NEGATIVE RODS   Final   Culture   Final    LIGHT GROWTH STAPHYLOCOCCUS AUREUS SUSCEPTIBILITIES TO FOLLOW    Report Status PENDING  Incomplete  RADIOLOGY:  Dg Lumbar Spine 2-3 Views  11/11/2015  CLINICAL DATA:  Low back pain radiating to both legs. No known injury. EXAM: LUMBAR SPINE - 2-3 VIEW COMPARISON:  None. FINDINGS: There is no evidence of lumbar spine fracture. Alignment is normal. Degenerative disc disease is seen at all lumbar levels, most severe at L5-S1. Mild lumbar kyphosis also noted. Lower lumbar facet DJD noted although suboptimally visualized without oblique radiographs. No focal lytic or sclerotic bone  lesions identified. IMPRESSION: No acute findings.   Degenerative spondylosis, as described above. Electronically Signed   By: Myles Rosenthal M.D.   On: 11/11/2015 13:29      Follow up with PCP in 1 week.  Management plans discussed with the patient, family and they are in agreement.  CODE STATUS:     Code Status Orders        Start     Ordered   11/10/15 0955  Do not attempt resuscitation (DNR)   Continuous    Question Answer Comment  In the event of cardiac or respiratory ARREST Do not call a "code blue"   In the event of cardiac or respiratory ARREST Do not perform Intubation, CPR, defibrillation or ACLS   In the event of cardiac or respiratory ARREST Use medication by any route, position, wound care, and other measures to relive pain and suffering. May use oxygen, suction and manual treatment of airway obstruction as needed for comfort.      11/10/15 0954    Advance Directive Documentation        Most Recent Value   Type of Advance Directive  Healthcare Power of Attorney, Living will   Pre-existing out of facility DNR order (yellow form or pink MOST form)     "MOST" Form in Place?        TOTAL TIME TAKING CARE OF THIS PATIENT ON DAY OF DISCHARGE: more than 30 minutes.    Milagros Loll R M.D on 11/12/2015 at 1:26 PM  Between 7am to 6pm - Pager - [phone]  After 6pm go to [email or web address] - password [email or web address] [facility]  [place] [facility]  Office  [phone]  CC: Primary care physician; Geoffery Lyons., PA     Note: This dictation was prepared with Dragon dictation along with smaller phrase technology. Any transcriptional errors that result from this process are unintentional.

## 2015-11-12 NOTE — Progress Notes (Signed)
Discharge summory pt has refused to leave by EMS to [place] stating she was promised to leave at 1800 an d calls friend  [address]

## 2015-11-12 NOTE — OR Nursing (Signed)
845Am :during recoveryphase pt.  having prolong period of apnea, non compliant with cpap machine at home. Dr. Darrold JunkerParaschos notified and BiPap ordered for recovery phase of care.

## 2015-11-12 NOTE — Clinical Social Work Placement (Signed)
   CLINICAL SOCIAL WORK PLACEMENT  NOTE  Date:  11/12/2015  Patient Details  Name: Jessica Donovan MRN: [id number] Date of Birth: 19-Jun-1946  Clinical Social Work is seeking post-discharge placement for this patient at the Skilled  Nursing Facility level of care (*CSW will initial, date and re-position this form in  chart as items are completed):  Yes   Patient/family provided with [facility] Clinical Social Work Department's list of facilities offering this level of care within the geographic area requested by the patient (or if unable, by the patient's family).  Yes   Patient/family informed of their freedom to choose among providers that offer the needed level of care, that participate in Medicare, Medicaid or managed care program needed by the patient, have an available bed and are willing to accept the patient.  Yes   Patient/family informed of [facility]'s ownership interest in [facility] and [facility], as well as of the fact that they are under no obligation to receive care at these facilities.  PASRR submitted to EDS on       PASRR number received on       Existing PASRR number confirmed on 11/12/15     FL2 transmitted to all facilities in geographic area requested by pt/family on 11/12/15     FL2 transmitted to all facilities within larger geographic area on 11/12/15     Patient informed that his/her managed care company has contracts with or will negotiate with certain facilities, including the following:        Yes   Patient/family informed of bed offers received.  Patient chooses bed at  [facility] [place]     Physician recommends and patient chooses bed at      Patient to be transferred to  [facility] [place] on 11/12/15.  Patient to be transferred to facility by  Sharin Mons([facility])     Patient family notified on 11/12/15 of transfer.  Name of family member notified:   (HPOA [name] is aware of D/C today. )     PHYSICIAN        Additional Comment:    _______________________________________________ Haig ProphetMorgan, Jaeleen Inzunza G, LCSW 11/12/2015, 4:09 PM

## 2015-11-12 NOTE — Progress Notes (Signed)
Pt called friend ann and stated ann cound not bring items needed for rehab until 6pm and that was what was promised , nurse called social worker Fredric MareBailey who said pt had to go to rehab in [place] as arranged by them and pt or she had to go home , pt aggreeable to go lhome , nurse asked to speak with ann, pt would not give nurse number rather called ann herself, finally pt allow ann tio talk to nurse nurse informed friend ann that pt was discharged and that she was going home with [name] , [name] said to let her talk to lpt one more time that [name] could bring belongings from home to [place], after speaking with ann pt was informed she had to go now with EMS and pt agreed after talking with [name]

## 2015-11-12 NOTE — Clinical Social Work Note (Signed)
Clinical Social Work Assessment  Patient Details  Name: Jessica Donovan MRN: [id number] Date of Birth: 07-Feb-1946  Date of referral:  11/12/15               Reason for consult:  Facility Placement                Permission sought to share information with:  Chartered certified accountant granted to share information::  Yes, Verbal Permission Granted  Name::      [place]::   [place]/ [place]   Relationship::     Contact Information:     Housing/Transportation Living arrangements for the past 2 months:  [place] of Information:  Patient Patient Interpreter Needed:  None Criminal Activity/Legal Involvement Pertinent to Current Situation/Hospitalization:  No - Comment as needed Significant Relationships:  Community Support (Sinclair Grooms [facility]) ) Lives with:  Self Do you feel safe going back to the place where you live?  Yes Need for family participation in patient care:  No (Coment)  Care giving concerns: Patient lives at [facility] in [place].    Social Worker assessment / plan:  Holiday representative (CSW) met with patient to discuss D/C plan. Patient was laying in bed and had just returned from getting a heart cath. Patient was alert and oriented. CSW introduced self and explained role of CSW department. Patient reported that she lives at [facility] and her primary support is Sinclair Grooms (Care Coordinator at [facility]). Patient reported that Vicente Males is her HOPA. Per patient she is a daughter and a sister that live on the [place] cost. Patient reported that they do not visit her because "she is too sick and they say it will put too many miles on their new car." CSW explained that PT is recommending short term rehab. Patient requested [facility] in [place]. CSW sent referral to [facility]. Patient is agreeable to going to SNF in [place] if [facility] does not have availability. CSW  explained that patient's transport to [place] will be more expensive then to SNF in [facility]. Patient reported that Vicente Males would transport her. Per patient she is from [place] and will have more visitors if she goes to [place].    CSW presented bed offers in [facility]. CSW will continue to follow and assist as needed.   Employment status:  Disabled (Comment on whether or not currently receiving Disability), Retired Forensic scientist:  Medicare PT Recommendations:  [place] / Referral to community resources:  [place]  Patient/Family's Response to care: Patient is agreeable to [facility] and prefers [facility] in [place].   Patient/Family's Understanding of and Emotional Response to Diagnosis, Current Treatment, and Prognosis: Patient was pleasant throughout assessment.   Emotional Assessment Appearance:  Appears stated age Attitude/Demeanor/Rapport:    Affect (typically observed):  Accepting, Adaptable, Pleasant Orientation:  Oriented to Self, Oriented to Place, Oriented to  Time, Oriented to Situation Alcohol / Substance use:  Not Applicable Psych involvement (Current and /or in the community):  No (Comment)  Discharge Needs  Concerns to be addressed:  Discharge Planning Concerns Readmission within the last 30 days:  No Current discharge risk:  Dependent with Mobility Barriers to Discharge:  Continued Medical Work up   Loralyn Freshwater, LCSW 11/12/2015, 11:54 AM

## 2015-11-12 NOTE — Progress Notes (Signed)
[facility] Physicians - [place] at [facility]   PATIENT NAME: Jessica Donovan    MR#:  [id number]  DATE OF BIRTH:  1946/07/08  SUBJECTIVE:  CHIEF COMPLAINT:  No chief complaint on file.  Still has left foot pain. No CP/SOB. Afebrile. Left 3rd phalanx amputation 11/10/2015  REVIEW OF SYSTEMS:  CONSTITUTIONAL: No fever, fatigue or weakness.  EYES: No blurred or double vision.  EARS, NOSE, AND THROAT: No tinnitus or ear pain.  RESPIRATORY: No cough, shortness of breath, wheezing or hemoptysis.  CARDIOVASCULAR: No chest pain, orthopnea, edema.  GASTROINTESTINAL: No nausea, vomiting, diarrhea or abdominal pain.  GENITOURINARY: No dysuria, hematuria.  ENDOCRINE: No polyuria, nocturia,  HEMATOLOGY: No anemia, easy bruising or bleeding. SKIN: No rash or lesion. MUSCULOSKELETAL: Left foot pain. NEUROLOGIC: No tingling, numbness, weakness.  PSYCHIATRY: No anxiety or depression.   DRUG ALLERGIES:   Allergies  Allergen Reactions  . Codeine Nausea And Vomiting  . Influenza Vaccines Other (See Comments)    Passed out  . Methadone Hives  . Percocet [Oxycodone-Acetaminophen] Nausea And Vomiting  . Tetanus Toxoids Swelling  . Tetracyclines & Related Rash    VITALS:  Blood pressure 130/70, pulse 60, temperature 98.4 F (36.9 C), temperature source Oral, resp. rate 18, height  (1.6 m), weight 79.379 kg (175 lb), SpO2 96 %.  PHYSICAL EXAMINATION:  GENERAL:  69 y.o.-year-old patient lying in the bed with no acute distress.  EYES: Pupils equal, round, reactive to light and accommodation. No scleral icterus. Extraocular muscles intact.  HEENT: Head atraumatic, normocephalic. Oropharynx and nasopharynx clear.  NECK:  Supple, no jugular venous distention. No thyroid enlargement, no tenderness.  LUNGS: Normal breath sounds bilaterally, no wheezing, rales,rhonchi or crepitation. No use of accessory muscles of respiration.  CARDIOVASCULAR: S1, S2 normal. No murmurs, rubs,  or gallops.  ABDOMEN: Soft, nontender, nondistended. Bowel sounds present. No organomegaly or mass.  EXTREMITIES: No pedal edema, cyanosis, or clubbing. Left 3rd toe in dressing with tenderness. NEUROLOGIC: Cranial nerves II through XII are intact. Muscle strength 5/5 in all extremities. Sensation intact. Gait not checked.  PSYCHIATRIC: The patient is alert and oriented x 3.  SKIN: Left 3rd toe ulcer   LABORATORY PANEL:   CBC  Recent Labs Lab 11/11/15 0345  WBC 8.9  HGB 12.3  HCT 37.2  PLT 216   ------------------------------------------------------------------------------------------------------------------  Chemistries   Recent Labs Lab 11/07/15 1510 11/08/15 0447  11/11/15 0345  NA 138 140  --   --   K 4.2 4.0  --   --   CL 102 104  --   --   CO2 30 30  --   --   GLUCOSE 115* 97  --   --   BUN 22* 18  --   --   CREATININE 0.68 0.73  < > 0.74  CALCIUM 9.5 9.0  --   --   AST 19  --   --   --   ALT 19  --   --   --   ALKPHOS 97  --   --   --   BILITOT 0.5  --   --   --   < > = values in this interval not displayed. ------------------------------------------------------------------------------------------------------------------  Cardiac Enzymes No results for input(s): TROPONINI in the last 168 hours. ------------------------------------------------------------------------------------------------------------------  RADIOLOGY:  Dg Lumbar Spine 2-3 Views  11/11/2015  CLINICAL DATA:  Low back pain radiating to both legs. No known injury. EXAM: LUMBAR SPINE - 2-3 VIEW COMPARISON:  None.  FINDINGS: There is no evidence of lumbar spine fracture. Alignment is normal. Degenerative disc disease is seen at all lumbar levels, most severe at L5-S1. Mild lumbar kyphosis also noted. Lower lumbar facet DJD noted although suboptimally visualized without oblique radiographs. No focal lytic or sclerotic bone lesions identified. IMPRESSION: No acute findings.   Degenerative  spondylosis, as described above. Electronically Signed   By: Myles RosenthalJohn  Stahl M.D.   On: 11/11/2015 13:29    EKG:   Orders placed or performed during the hospital encounter of 11/07/15  . EKG 12-Lead  . EKG 12-Lead    ASSESSMENT AND PLAN:   * Osteomyelitis of left 3rd phalanx.  Positive MRSA PCR On IV vanc and Zosyn. Stopped 11/21 Short course of PO bactrim. S/p Amputation  * Coronary artery disease with recurrent angina Catheterization done earlier today showed patent stents. No chest pain  * Diabetes Continue Lantus 20 units SQ HS and insulin sliding scale.  * Hypertension Continue Lopressor, Lasix, imdur and lisinopril.  * Chronic pain She is on long-acting and immediate release morphine at home, will continue the same dose here in hospital.  * DVT prophylaxis Lovenox  CODE STATUS: DO NOT RESUSCITATE  TOTAL TIME TAKING CARE OF THIS PATIENT: 35 minutes.   Patient [place] [facility] [place]. Social worker is contacting facilities in [place]. Discharge when bed available.  Milagros LollSudini, Lizbeth Feijoo R M.D on 11/12/2015 at 3:06 PM  Between 7am to 6pm - Pager - [phone]  After 6pm go to [email or web address] - password [email or web address] [facility]  [facility]  Office  [phone]  CC: Primary care physician; Geoffery Lyonsurner, Eric M., PA

## 2015-11-12 NOTE — Progress Notes (Signed)
Physical Therapy Treatment Patient Details Name: Jessica Donovan MRN: [id number] DOB: Jun 25, 1946 Today's Date: 11/12/2015    History of Present Illness Pt is a 69 y/o female that presents with suspicion of osteomyelitis and subsequent amputation of 3rd L toe. Patient has a past history of several injurious falls and a stroke leaving her with residual L sided deficits per patient.     PT Comments    Treatment attempted initially this a.m; pt busy with nursing and had not received any medication yet at that time. Second attempt, pt reports fatigue, but agreeable to PT and ambulation. Pt progressing ambulation quality and distance and subjectively notes improved steadiness. Pt does note upper extremities particularly right upper extremity fatigues quickly with ambulation due to residual weakness from and old stroke. Pt compliant with left heel touch weight bearing. Pt independent with toileting . Continue PT for progression of overall strength to improve ambulation and all functional mobility to prior level of function.   Follow Up Recommendations  SNF     Equipment Recommendations  Rolling walker with 5" wheels    Recommendations for Other Services       Precautions / Restrictions Restrictions Weight Bearing Restrictions: Yes Other Position/Activity Restrictions: Heel touch Weight bearing with surgical shoe on.     Mobility  Bed Mobility Overal bed mobility: Modified Independent             General bed mobility comments:  (use of rails/slight increased time)  Transfers Overall transfer level: Needs assistance Equipment used: Rolling walker (2 wheeled) Transfers: Sit to/from Stand Sit to Stand: Min guard (cues for safe hand placement)         General transfer comment: STS from bed and commode with Min guard and cues for safe hand placement  Ambulation/Gait Ambulation/Gait assistance: Min guard Ambulation Distance (Feet): 64 Feet (second walk bed to/from commode 12  ft) Assistive device: Rolling walker (2 wheeled) Gait Pattern/deviations: Step-to pattern Gait velocity: reduced Gait velocity interpretation: <1.8 ft/sec, indicative of risk for recurrent falls General Gait Details: Good compliance with step to pattern and heel touch weightbearing on L; fatigues in BUE especially R due to old CVA residual weakness   Stairs            Wheelchair Mobility    Modified Rankin (Stroke Patients Only)       Balance Overall balance assessment: Needs assistance         Standing balance support: Bilateral upper extremity supported;No upper extremity supported Standing balance-Leahy Scale: Fair                      Cognition Arousal/Alertness: Awake/alert Behavior During Therapy: WFL for tasks assessed/performed;Flat affect Overall Cognitive Status: Within Functional Limits for tasks assessed                      Exercises      General Comments        Pertinent Vitals/Pain Pain Assessment: 0-10 Pain Score: 7  Pain Location: L foot Pain Descriptors / Indicators: Throbbing Pain Intervention(s): Monitored during session;Patient requesting pain meds-RN notified    Home Living                      Prior Function            PT Goals (current goals can now be found in the care plan section) Progress towards PT goals: Progressing toward goals    Frequency  7X/week  PT Plan Current plan remains appropriate    Co-evaluation             End of Session Equipment Utilized During Treatment: Gait belt Activity Tolerance: Patient tolerated treatment well;Patient limited by fatigue Patient left: in bed;with call bell/phone within reach;with bed alarm set (refused leg pumps)     Time: 1343-1410 PT Time Calculation (min) (ACUTE ONLY): 27 min  Charges:  $Gait Training: 23-37 mins                    G Codes:      Kristeen MissHeidi Elizabeth Bishop 11/12/2015, 2:21 PM

## 2015-11-13 LAB — WOUND CULTURE

## 2015-11-14 LAB — ANAEROBIC CULTURE

## 2015-11-18 ENCOUNTER — Ambulatory Visit: Payer: Medicare Other | Admitting: Anesthesiology

## 2015-11-27 DIAGNOSIS — I259 Chronic ischemic heart disease, unspecified: Secondary | ICD-10-CM | POA: Insufficient documentation

## 2015-11-29 ENCOUNTER — Encounter: Payer: Self-pay | Admitting: Emergency Medicine

## 2015-11-29 ENCOUNTER — Emergency Department: Payer: Medicare Other

## 2015-11-29 ENCOUNTER — Encounter (INDEPENDENT_AMBULATORY_CARE_PROVIDER_SITE_OTHER): Payer: Self-pay

## 2015-11-29 ENCOUNTER — Inpatient Hospital Stay
Admission: EM | Admit: 2015-11-29 | Discharge: 2015-12-01 | DRG: 682 | Disposition: A | Discharge status: Home Health | Payer: Medicare Other | Attending: Internal Medicine | Admitting: Internal Medicine

## 2015-11-29 DIAGNOSIS — N19 Unspecified kidney failure: Secondary | ICD-10-CM

## 2015-11-29 DIAGNOSIS — Z8673 Personal history of transient ischemic attack (TIA), and cerebral infarction without residual deficits: Secondary | ICD-10-CM

## 2015-11-29 DIAGNOSIS — M545 Low back pain: Secondary | ICD-10-CM | POA: Diagnosis present

## 2015-11-29 DIAGNOSIS — I252 Old myocardial infarction: Secondary | ICD-10-CM | POA: Diagnosis not present

## 2015-11-29 DIAGNOSIS — E860 Dehydration: Secondary | ICD-10-CM | POA: Diagnosis present

## 2015-11-29 DIAGNOSIS — Z885 Allergy status to narcotic agent status: Secondary | ICD-10-CM | POA: Diagnosis not present

## 2015-11-29 DIAGNOSIS — I2510 Atherosclerotic heart disease of native coronary artery without angina pectoris: Secondary | ICD-10-CM | POA: Diagnosis present

## 2015-11-29 DIAGNOSIS — Z7982 Long term (current) use of aspirin: Secondary | ICD-10-CM | POA: Diagnosis not present

## 2015-11-29 DIAGNOSIS — Z66 Do not resuscitate: Secondary | ICD-10-CM | POA: Diagnosis present

## 2015-11-29 DIAGNOSIS — I959 Hypotension, unspecified: Secondary | ICD-10-CM | POA: Diagnosis present

## 2015-11-29 DIAGNOSIS — I952 Hypotension due to drugs: Secondary | ICD-10-CM | POA: Diagnosis present

## 2015-11-29 DIAGNOSIS — E119 Type 2 diabetes mellitus without complications: Secondary | ICD-10-CM | POA: Diagnosis present

## 2015-11-29 DIAGNOSIS — G894 Chronic pain syndrome: Secondary | ICD-10-CM | POA: Diagnosis present

## 2015-11-29 DIAGNOSIS — R339 Retention of urine, unspecified: Secondary | ICD-10-CM | POA: Diagnosis present

## 2015-11-29 DIAGNOSIS — N179 Acute kidney failure, unspecified: Secondary | ICD-10-CM | POA: Diagnosis present

## 2015-11-29 DIAGNOSIS — Z79899 Other long term (current) drug therapy: Secondary | ICD-10-CM

## 2015-11-29 DIAGNOSIS — Z887 Allergy status to serum and vaccine status: Secondary | ICD-10-CM | POA: Diagnosis not present

## 2015-11-29 DIAGNOSIS — T370X5A Adverse effect of sulfonamides, initial encounter: Secondary | ICD-10-CM | POA: Diagnosis present

## 2015-11-29 DIAGNOSIS — T428X5A Adverse effect of antiparkinsonism drugs and other central muscle-tone depressants, initial encounter: Secondary | ICD-10-CM | POA: Diagnosis present

## 2015-11-29 DIAGNOSIS — G9340 Encephalopathy, unspecified: Secondary | ICD-10-CM | POA: Diagnosis present

## 2015-11-29 DIAGNOSIS — Y929 Unspecified place or not applicable: Secondary | ICD-10-CM | POA: Diagnosis not present

## 2015-11-29 DIAGNOSIS — I10 Essential (primary) hypertension: Secondary | ICD-10-CM | POA: Diagnosis present

## 2015-11-29 DIAGNOSIS — Z794 Long term (current) use of insulin: Secondary | ICD-10-CM | POA: Diagnosis not present

## 2015-11-29 DIAGNOSIS — E785 Hyperlipidemia, unspecified: Secondary | ICD-10-CM | POA: Diagnosis present

## 2015-11-29 DIAGNOSIS — Z888 Allergy status to other drugs, medicaments and biological substances status: Secondary | ICD-10-CM

## 2015-11-29 DIAGNOSIS — K219 Gastro-esophageal reflux disease without esophagitis: Secondary | ICD-10-CM | POA: Diagnosis present

## 2015-11-29 DIAGNOSIS — Z89422 Acquired absence of other left toe(s): Secondary | ICD-10-CM | POA: Diagnosis not present

## 2015-11-29 DIAGNOSIS — N170 Acute kidney failure with tubular necrosis: Secondary | ICD-10-CM | POA: Diagnosis not present

## 2015-11-29 LAB — URINALYSIS COMPLETE WITH MICROSCOPIC (ARMC ONLY)
BILIRUBIN URINE: NEGATIVE
BILIRUBIN URINE: NEGATIVE
Bacteria, UA: NONE SEEN
GLUCOSE, UA: NEGATIVE mg/dL
Glucose, UA: NEGATIVE mg/dL
HGB URINE DIPSTICK: NEGATIVE
HGB URINE DIPSTICK: NEGATIVE
Ketones, ur: NEGATIVE mg/dL
Ketones, ur: NEGATIVE mg/dL
LEUKOCYTES UA: NEGATIVE
LEUKOCYTES UA: NEGATIVE
Nitrite: NEGATIVE
Nitrite: NEGATIVE
PH: 5 (ref 5.0–8.0)
Protein, ur: NEGATIVE mg/dL
Protein, ur: NEGATIVE mg/dL
RBC / HPF: NONE SEEN RBC/hpf (ref 0–5)
RBC / HPF: NONE SEEN RBC/hpf (ref 0–5)
SPECIFIC GRAVITY, URINE: 1.01 (ref 1.005–1.030)
SQUAMOUS EPITHELIAL / LPF: NONE SEEN
Specific Gravity, Urine: 1.006 (ref 1.005–1.030)
Squamous Epithelial / LPF: NONE SEEN
WBC UA: NONE SEEN WBC/hpf (ref 0–5)
pH: 5 (ref 5.0–8.0)

## 2015-11-29 LAB — CBC WITH DIFFERENTIAL/PLATELET
BASOS ABS: 0.1 10*3/uL (ref 0–0.1)
BASOS PCT: 1 %
EOS ABS: 0.3 10*3/uL (ref 0–0.7)
EOS PCT: 3 %
HCT: 36.1 % (ref 35.0–47.0)
HEMOGLOBIN: 11.7 g/dL — AB (ref 12.0–16.0)
Lymphocytes Relative: 29 %
Lymphs Abs: 2.9 10*3/uL (ref 1.0–3.6)
MCH: 30.9 pg (ref 26.0–34.0)
MCHC: 32.2 g/dL (ref 32.0–36.0)
MCV: 95.9 fL (ref 80.0–100.0)
Monocytes Absolute: 1 10*3/uL — ABNORMAL HIGH (ref 0.2–0.9)
Monocytes Relative: 10 %
NEUTROS PCT: 57 %
Neutro Abs: 5.9 10*3/uL (ref 1.4–6.5)
PLATELETS: 229 10*3/uL (ref 150–440)
RBC: 3.77 MIL/uL — AB (ref 3.80–5.20)
RDW: 14.2 % (ref 11.5–14.5)
WBC: 10 10*3/uL (ref 3.6–11.0)

## 2015-11-29 LAB — AMMONIA: Ammonia: 23 umol/L (ref 9–35)

## 2015-11-29 LAB — COMPREHENSIVE METABOLIC PANEL
ALBUMIN: 3.7 g/dL (ref 3.5–5.0)
ALK PHOS: 64 U/L (ref 38–126)
ALT: 10 U/L — AB (ref 14–54)
AST: 22 U/L (ref 15–41)
Anion gap: 9 (ref 5–15)
BUN: 32 mg/dL — ABNORMAL HIGH (ref 6–20)
CALCIUM: 9.3 mg/dL (ref 8.9–10.3)
CO2: 26 mmol/L (ref 22–32)
Chloride: 101 mmol/L (ref 101–111)
Creatinine, Ser: 1.87 mg/dL — ABNORMAL HIGH (ref 0.44–1.00)
GFR calc non Af Amer: 26 mL/min — ABNORMAL LOW (ref >60)
GFR, EST AFRICAN AMERICAN: 31 mL/min — AB (ref >60)
Glucose, Bld: 167 mg/dL — ABNORMAL HIGH (ref 65–99)
Potassium: 4.2 mmol/L (ref 3.5–5.1)
SODIUM: 136 mmol/L (ref 135–145)
TOTAL PROTEIN: 6.4 g/dL — AB (ref 6.5–8.1)
Total Bilirubin: 0.5 mg/dL (ref 0.3–1.2)

## 2015-11-29 LAB — LACTIC ACID, PLASMA: LACTIC ACID, VENOUS: 1.8 mmol/L (ref 0.5–2.0)

## 2015-11-29 LAB — GLUCOSE, CAPILLARY
GLUCOSE-CAPILLARY: 159 mg/dL — AB (ref 65–99)
GLUCOSE-CAPILLARY: 135 mg/dL — AB (ref 65–99)
GLUCOSE-CAPILLARY: 133 mg/dL — AB (ref 65–99)

## 2015-11-29 LAB — MAGNESIUM: MAGNESIUM: 1.8 mg/dL (ref 1.7–2.4)

## 2015-11-29 LAB — TROPONIN I: Troponin I: <0.03 ng/mL (ref <0.031)

## 2015-11-29 MED ORDER — SODIUM CHLORIDE 0.9 % IV SOLN
INTRAVENOUS | Status: DC
  Administered 2015-11-29 – 2015-12-01 (×4): via INTRAVENOUS

## 2015-11-29 MED ORDER — HEPARIN SODIUM (PORCINE) 5000 UNIT/ML IJ SOLN
5000.0000 [IU] | Freq: Three times a day (TID) | INTRAMUSCULAR | Status: DC
  Administered 2015-11-29 – 2015-12-01 (×5): 5000 [IU] via SUBCUTANEOUS
  Filled 2015-11-29 (×5): qty 1

## 2015-11-29 MED ORDER — ATORVASTATIN CALCIUM 20 MG PO TABS
20.0000 mg | ORAL_TABLET | Freq: Every day | ORAL | Status: DC
  Administered 2015-11-30 – 2015-12-01 (×2): 20 mg via ORAL
  Filled 2015-11-29 (×2): qty 1

## 2015-11-29 MED ORDER — ALPRAZOLAM 1 MG PO TABS
1.0000 mg | ORAL_TABLET | Freq: Two times a day (BID) | ORAL | Status: DC | PRN
  Administered 2015-11-30: 1 mg via ORAL
  Filled 2015-11-29: qty 1

## 2015-11-29 MED ORDER — DIPHENOXYLATE-ATROPINE 2.5-0.025 MG PO TABS: 1.0000 | ORAL_TABLET | Freq: Four times a day (QID) | ORAL | Status: DC | PRN

## 2015-11-29 MED ORDER — TICAGRELOR 90 MG PO TABS
90.0000 mg | ORAL_TABLET | Freq: Two times a day (BID) | ORAL | Status: DC
  Administered 2015-11-29 – 2015-12-01 (×4): 90 mg via ORAL
  Filled 2015-11-29 (×4): qty 1

## 2015-11-29 MED ORDER — VENLAFAXINE HCL ER 75 MG PO CP24
150.0000 mg | ORAL_CAPSULE | Freq: Every day | ORAL | Status: DC
  Administered 2015-11-30 – 2015-12-01 (×2): 150 mg via ORAL
  Filled 2015-11-29 (×2): qty 2

## 2015-11-29 MED ORDER — SODIUM CHLORIDE 0.9 % IV SOLN
Freq: Once | INTRAVENOUS | Status: AC
Start: 2015-11-29 — End: 2015-11-29
  Administered 2015-11-29: 17:00:00 via INTRAVENOUS

## 2015-11-29 MED ORDER — TIZANIDINE HCL 4 MG PO TABS
4.0000 mg | ORAL_TABLET | Freq: Three times a day (TID) | ORAL | Status: DC
  Administered 2015-11-30: 4 mg via ORAL
  Filled 2015-11-29 (×3): qty 1

## 2015-11-29 MED ORDER — SODIUM CHLORIDE 0.9 % IV SOLN
Freq: Once | INTRAVENOUS | Status: AC
Start: 2015-11-29 — End: 2015-11-29
  Administered 2015-11-29: 16:00:00 via INTRAVENOUS

## 2015-11-29 MED ORDER — TRAZODONE HCL 100 MG PO TABS
100.0000 mg | ORAL_TABLET | Freq: Every evening | ORAL | Status: DC | PRN
  Administered 2015-11-29 – 2015-11-30 (×2): 100 mg via ORAL
  Filled 2015-11-29 (×2): qty 1

## 2015-11-29 MED ORDER — ALBUTEROL SULFATE (2.5 MG/3ML) 0.083% IN NEBU: 2.5000 mg | INHALATION_SOLUTION | RESPIRATORY_TRACT | Status: DC | PRN

## 2015-11-29 MED ORDER — ONDANSETRON HCL 4 MG/2ML IJ SOLN: 4.0000 mg | Freq: Four times a day (QID) | INTRAMUSCULAR | Status: DC | PRN

## 2015-11-29 MED ORDER — SODIUM CHLORIDE 0.9 % IV BOLUS (SEPSIS)
1000.0000 mL | Freq: Once | INTRAVENOUS | Status: AC
  Administered 2015-11-29: 1000 mL via INTRAVENOUS

## 2015-11-29 MED ORDER — ACETAMINOPHEN 325 MG PO TABS
650.0000 mg | ORAL_TABLET | Freq: Four times a day (QID) | ORAL | Status: DC | PRN
  Administered 2015-11-29 – 2015-12-01 (×5): 650 mg via ORAL
  Filled 2015-11-29 (×5): qty 2

## 2015-11-29 MED ORDER — NITROGLYCERIN 0.4 MG SL SUBL
0.4000 mg | SUBLINGUAL_TABLET | SUBLINGUAL | Status: DC | PRN
Start: 2015-11-29 — End: 2015-12-01

## 2015-11-29 MED ORDER — FERROUS SULFATE 325 (65 FE) MG PO TABS
325.0000 mg | ORAL_TABLET | Freq: Every day | ORAL | Status: DC
  Administered 2015-11-30 – 2015-12-01 (×2): 325 mg via ORAL
  Filled 2015-11-29 (×2): qty 1

## 2015-11-29 MED ORDER — MIRTAZAPINE 15 MG PO TABS
15.0000 mg | ORAL_TABLET | Freq: Every day | ORAL | Status: DC
  Administered 2015-11-29 – 2015-11-30 (×2): 15 mg via ORAL
  Filled 2015-11-29 (×2): qty 1

## 2015-11-29 MED ORDER — NALOXONE HCL 2 MG/2ML IJ SOSY
0.4000 mg | PREFILLED_SYRINGE | Freq: Once | INTRAMUSCULAR | Status: AC
  Administered 2015-11-29: 0.4 mg via INTRAVENOUS

## 2015-11-29 MED ORDER — ONDANSETRON HCL 4 MG PO TABS: 4.0000 mg | ORAL_TABLET | Freq: Four times a day (QID) | ORAL | Status: DC | PRN

## 2015-11-29 MED ORDER — ASPIRIN EC 81 MG PO TBEC
81.0000 mg | DELAYED_RELEASE_TABLET | Freq: Every day | ORAL | Status: DC
  Administered 2015-11-30 – 2015-12-01 (×2): 81 mg via ORAL
  Filled 2015-11-29 (×2): qty 1

## 2015-11-29 MED ORDER — FESOTERODINE FUMARATE ER 4 MG PO TB24
4.0000 mg | ORAL_TABLET | Freq: Every day | ORAL | Status: DC
  Administered 2015-11-30 – 2015-12-01 (×2): 4 mg via ORAL
  Filled 2015-11-29 (×2): qty 1

## 2015-11-29 MED ORDER — INSULIN ASPART 100 UNIT/ML ~~LOC~~ SOLN
0.0000 [IU] | Freq: Three times a day (TID) | SUBCUTANEOUS | Status: DC
  Administered 2015-11-30 – 2015-12-01 (×2): 1 [IU] via SUBCUTANEOUS
  Filled 2015-11-29 (×2): qty 1

## 2015-11-29 MED ORDER — ACETAMINOPHEN 650 MG RE SUPP: 650.0000 mg | Freq: Four times a day (QID) | RECTAL | Status: DC | PRN

## 2015-11-29 MED ORDER — PREGABALIN 75 MG PO CAPS
75.0000 mg | ORAL_CAPSULE | Freq: Two times a day (BID) | ORAL | Status: DC
  Administered 2015-11-29 – 2015-12-01 (×4): 75 mg via ORAL
  Filled 2015-11-29 (×4): qty 1

## 2015-11-29 MED ORDER — PANTOPRAZOLE SODIUM 40 MG PO TBEC
40.0000 mg | DELAYED_RELEASE_TABLET | Freq: Every day | ORAL | Status: DC
  Administered 2015-11-30 – 2015-12-01 (×2): 40 mg via ORAL
  Filled 2015-11-29 (×2): qty 1

## 2015-11-29 MED ORDER — INSULIN ASPART 100 UNIT/ML ~~LOC~~ SOLN: 0.0000 [IU] | Freq: Every day | SUBCUTANEOUS | Status: DC

## 2015-11-29 MED ORDER — NALOXONE HCL 2 MG/2ML IJ SOSY
PREFILLED_SYRINGE | INTRAMUSCULAR | Status: AC
  Filled 2015-11-29: qty 2

## 2015-11-29 MED ORDER — INSULIN GLARGINE 100 UNIT/ML ~~LOC~~ SOLN
25.0000 [IU] | Freq: Every day | SUBCUTANEOUS | Status: DC
  Administered 2015-11-29: 25 [IU] via SUBCUTANEOUS
  Filled 2015-11-29 (×3): qty 0.25

## 2015-11-29 NOTE — ED Provider Notes (Signed)
Time Seen: Approximately 1359  I have reviewed the triage notes  Chief Complaint: Urinary Retention and Hypotension   History of Present Illness: Jessica Donovan is a 69 y.o. female who presents from the [place] clinic with altered mental status and hypotension. Was called to bedside to see the patient with her blood pressure being at 63/40. Pulse oximetry is initially 89% on room air. Patient has a hard time answering questions but is arousable with verbal and painful stimuli. She states that she took Phenergan tablets prior to arrival along with her normal MS Contin. Patient was recently admitted here to the hospital for inpatient treatment of osteomyelitis. Any fever at home and denies any head trauma. She denies any focal weakness. Patient's very drowsy and still difficult to maintain any consistent history or review of systems. She describes urinary retention and states that she hasn't urinated over the last 24 hours. Past Medical History  Diagnosis Date  . Hypertension   . Diabetes mellitus without complication (HCC)   . Coronary artery disease   . Low back pain   . Stroke (HCC)   . Myocardial infarction (HCC)   . Chronic back pain     Patient Active Problem List   Diagnosis Date Noted  . ARF (acute renal failure) (HCC) 11/29/2015  . Hypotension 11/29/2015  . 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 Angina effort (HCC) 11/07/2015  . Osteomyelitis (HCC) 11/07/2015    Past Surgical History  Procedure Laterality Date  . Toe amputation    . 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 Cardiac catheterization N/A 11/12/2015    Procedure: Left Heart Cath and Coronary Angiography;  Surgeon: Marcina Millard, MD;  Location: [facility] INVASIVE CV [facility];  Service: Cardiovascular;  Laterality: N/A;    Past Surgical History  Procedure Laterality Date  . Toe amputation    .  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 Cardiac catheterization N/A 11/12/2015    Procedure: Left Heart Cath and Coronary Angiography;  Surgeon: Marcina Millard, MD;  Location: [facility] INV[facility] CV [facility];  Service: Cardiovascular;  Laterality: N/A;    Current Outpatient Rx  Name  Route  Sig  Dispense  Refill  . ALPRAZolam (XANAX) 1 MG tablet   Oral   Take 1 tablet (1 mg total) by mouth 2 (two) times daily as needed for anxiety.   20 tablet   0   . aspirin EC 81 MG tablet   Oral   Take 81 mg by mouth daily.         [name] atorvastatin (LIPITOR) 20 MG tablet   Oral   Take 20 mg by mouth daily.         . diphenoxylate-atropine (LOMOTIL) 2.5-0.025 MG tablet   Oral   Take 1 tablet by mouth 4 (four) times daily as needed for diarrhea or loose stools.         . ergocalciferol (VITAMIN D2) 50000 UNITS capsule   Oral   Take 50,000 Units by mouth once a week.         . ferrous sulfate 325 (65 FE) MG tablet   Oral   Take 325 mg by mouth daily with breakfast.         . furosemide (LASIX) 20 MG tablet   Oral  Take 20 mg by mouth daily.         [name] gabapentin (NEURONTIN) 100 MG capsule   Oral   Take 100 mg by mouth.         . insulin glargine (LANTUS) 100 UNIT/ML injection   Subcutaneous   Inject 0.25 mLs (25 Units total) into the skin at bedtime.   10 mL   11   . isosorbide mononitrate (IMDUR) 30 MG 24 hr tablet   Oral   Take 30 mg by mouth daily.         [name] liothyronine (CYTOMEL) 25 MCG tablet   Oral   Take 25 mcg by mouth daily.         [name] lisinopril (PRINIVIL,ZESTRIL) 20 MG tablet   Oral   Take 20 mg by mouth daily.         . metoprolol (LOPRESSOR) 25 MG tablet   Oral   Take 0.5 tablets (12.5 mg total) by mouth 2 (two) times daily.         . mirtazapine (REMERON) 15 MG tablet   Oral   Take 15 mg by mouth at bedtime.         [name] morphine  (MS CONTIN) 30 MG 12 hr tablet   Oral   Take 1 tablet (30 mg total) by mouth 3 (three) times daily.   30 tablet   0   . morphine (MSIR) 15 MG tablet   Oral   Take 1 tablet (15 mg total) by mouth every 6 (six) hours as needed for severe pain.   30 tablet   0   . nitroGLYCERIN (NITROSTAT) 0.4 MG SL tablet   Sublingual   Place 0.4 mg under the tongue every 5 (five) minutes as needed for chest pain.         [name] omeprazole (PRILOSEC) 20 MG capsule   Oral   Take 20 mg by mouth daily.         . pregabalin (LYRICA) 75 MG capsule   Oral   Take 1 capsule (75 mg total) by mouth 2 (two) times daily.   60 capsule   0   . solifenacin (VESICARE) 10 MG tablet   Oral   Take by mouth daily.         . ticagrelor (BRILINTA) 90 MG TABS tablet   Oral   Take 90 mg by mouth 2 (two) times daily.         [name] tiZANidine (ZANAFLEX) 4 MG tablet   Oral   Take 4 mg by mouth 3 (three) times daily.         [name] tolterodine (DETROL LA) 4 MG 24 hr capsule   Oral   Take 4 mg by mouth daily.         . traZODone (DESYREL) 100 MG tablet   Oral   Take 100 mg by mouth at bedtime as needed for sleep.         [name] venlafaxine XR (EFFEXOR-XR) 150 MG 24 hr capsule   Oral   Take 150 mg by mouth daily with breakfast.         . sulfamethoxazole-trimethoprim (BACTRIM DS,SEPTRA DS) 800-160 MG tablet   Oral   Take 1 tablet by mouth every 12 (twelve) hours.   6 tablet   0     Allergies:  Codeine; Influenza vaccines; Methadone; Percocet; Tetanus toxoids; and Tetracyclines & related  Family History: Family History  Problem Relation Age of Onset  . CAD Mother   .  CAD Father     Social History: Social History  Substance Use Topics  . Smoking status: Never Smoker   . Smokeless tobacco: None  . Alcohol Use: None     Review of Systems:   10 point review of systems was performed and was otherwise negative:  Constitutional: No fever Eyes: No visual disturbances ENT: No sore throat, ear  pain Cardiac: No chest pain Respiratory: No shortness of breath, wheezing, or stridor Abdomen: No abdominal pain, no vomiting, No diarrhea Endocrine: No weight loss, No night sweats Extremities: No peripheral edema, cyanosis Skin: No rashes, easy bruising Neurologic: No focal weakness, trouble with speech or swollowing Urologic: No dysuria, Hematuria, or urinary frequency Review of systems difficult to obtain and was obtained over a series of questioning due to the patient's altered mental status  Physical Exam:  ED Triage Vitals  Enc Vitals Group     BP 11/29/15 1358 63/40 mmHg     Pulse Rate 11/29/15 1358 62     Resp 11/29/15 1358 16     Temp 11/29/15 1438 97.4 F (36.3 C)     Temp Source 11/29/15 1438 Rectal     SpO2 11/29/15 1358 89 %     Weight 11/29/15 1358 170 lb (77.111 kg)     Height 11/29/15 1358  (1.702 m)     Head Cir --      Peak Flow --      Pain Score --      Pain Loc --      Pain Edu? --      Excl. in GC? --     General: Awake , Alert , and Oriented times 3; GCS of 14 mainly due to lethargy Head: Normal cephalic , atraumatic Eyes: Pupils equal , round, reactive to light Nose/Throat: No nasal drainage, patent upper airway without erythema or exudate.  Neck: Supple, Full range of motion, No anterior adenopathy or palpable thyroid masses Lungs: Clear to ascultation without wheezes , rhonchi, or rales Heart: Regular rate, regular rhythm without murmurs , gallops , or rubs Abdomen: Soft, non tender without rebound, guarding , or rigidity; bowel sounds positive and symmetric in all 4 quadrants. No organomegaly .        Extremities: 2 plus symmetric pulses. No edema, clubbing or cyanosis Neurologic: normal ambulation, Motor symmetric without deficits, sensory intact Skin: warm, dry, no rashes   Labs:   All laboratory work was reviewed including any pertinent negatives or positives listed below:  Labs Reviewed  CBC WITH DIFFERENTIAL/PLATELET - Abnormal;  Notable for the following:    RBC 3.77 (*)    Hemoglobin 11.7 (*)    Monocytes Absolute 1.0 (*)    All other components within normal limits  COMPREHENSIVE METABOLIC PANEL - Abnormal; Notable for the following:    Glucose, Bld 167 (*)    BUN 32 (*)    Creatinine, Ser 1.87 (*)    Total Protein 6.4 (*)    ALT 10 (*)    GFR calc non Af Amer 26 (*)    GFR calc Af Amer 31 (*)    All other components within normal limits  URINALYSIS COMPLETEWITH MICROSCOPIC ([facility] ONLY) - Abnormal; Notable for the following:    Color, Urine YELLOW (*)    APPearance CLEAR (*)    All other components within normal limits  BLOOD GAS, VENOUS - Abnormal; Notable for the following:    pH, Ven 7.28 (*)    All other components within normal limits  GLUCOSE, CAPILLARY - Abnormal; Notable for the following:    Glucose-Capillary 159 (*)    All other components within normal limits  TROPONIN I  LACTIC ACID, PLASMA  AMMONIA   review of laboratory work is significant for elevated BUN and creatinine level.  EKG:  ED ECG REPORT I, Jennye MoccasinBrian S Sidney Silberman, the attending physician, personally viewed and interpreted this ECG.  Date: 11/29/2015 EKG Time: 1408 Rate: 56 Rhythm: normal sinus rhythm QRS Axis: normal Intervals: normal ST/T Wave abnormalities: normal Conduction Disutrbances: none Narrative Interpretation: unremarkable Left axis deviation otherwise normal EKG   Radiology: *  Narrative:    CLINICAL DATA: 69 year old with acute mental status changes, patient incoherent while imaging. Current history of diabetes, hypertension. Personal history of stroke.  EXAM: PORTABLE CHEST 1 VIEW  COMPARISON: None.  FINDINGS: Markedly suboptimal inspiration with atelectasis at the lung bases, left greater than right. Cardiac silhouette likely mildly enlarged, allowing for the AP portable technique and degree of inspiration. Pulmonary vascularity normal. No pneumothorax.  IMPRESSION: Markedly suboptimal  inspiration accounting for bibasilar atelectasis, left greater than right. No acute cardiopulmonary disease otherwise.         I personally reviewed the radiologic studies   P Critical Care: CRITICAL CARE Performed by: Jennye MoccasinBrian S Jayvier Burgher   Total critical care time: 41 minutes  Critical care time was exclusive of separately billable procedures and treating other patients.  Critical care was necessary to treat or prevent imminent or life-threatening deterioration.  Critical care was time spent personally by me on the following activities: development of treatment plan with patient and/or surrogate as well as nursing, discussions with consultants, evaluation of patient's response to treatment, examination of patient, obtaining history from patient or surrogate, ordering and performing treatments and interventions, ordering and review of laboratory studies, ordering and review of radiographic studies, pulse oximetry and re-evaluation of patient's condition. Immediate evaluation and treatment for hypotension and altered mental status    ED Course:  The patient had 2 IVs established and is currently supine with blood pressure that eventually dropped to 59 systolic. Patient was initiated on IV fluid resuscitation and through her emergency department stay acquired close to 2 L of fluid. Eventually her blood pressure did maintain a systolic greater than 100. I felt we did not need to start pressors at this time and the patient was initiated on a septic workup which seems to be negative at this time. Her altered mental status may be due to the medications that she took prior to arrival. She does not appear to have any focal deficits and indicative of an acute cerebral vascular accident. She does not appear to be septic or have a gastrointestinal bleed, etc. The patient does have renal insufficiency which may explain why she's had decreased urine output. Bladder scan performed at the bedside was  178. Patient had a Foley catheter inserted by the nursing staff along with a rectal temperature which was afebrile. Stool did not appear dark or melanotic. Patient did receive Narcan which did improve her mental status and was improved and answering questions. She denies any current pain. We were able to go through the review of systems has not had any postoperative issues, etc.   Assessment:  Altered mental status likely medication related Prerenal failure   Final Clinical Impression:   Final diagnoses:  Renal failure     Plan:  Inpatient management I spoke to the hospitalist team, further disposition and management depends upon her evaluation.  Jennye Moccasin, MD 11/29/15 [phone]

## 2015-11-29 NOTE — ED Notes (Signed)
Pt comes into the ED [facility].  Patient was going in to have check up on amputated toe. KC unable to attain BP and explained she was somnolent.

## 2015-11-29 NOTE — H&P (Addendum)
[facility] - [place] at [facility]   PATIENT NAME: Jessica Donovan    MR#:  [id number]  DATE OF BIRTH:  [birth year]  DATE OF ADMISSION:  11/29/2015  PRIMARY CARE PHYSICIAN: Geoffery Lyons., PA   REQUESTING/REFERRING PHYSICIAN: Dr. Huel Cote.  CHIEF COMPLAINT:   Chief Complaint  Patient presents with  . Urinary Retention  . Hypotension   confusion today.  HISTORY OF PRESENT ILLNESS:  Jessica Donovan  is a 69 y.o. female with a known history of hypertension, diabetes and CAD. The patient was sent to ED due to confusion today. The patient was reported to take 3 doses of Phenergan and oxycodone at home today. She was found confused and drowsy then sent to ED for further evaluation. She was found to have hypotension with a blood pressure at 59/34, was treated with normal saline bolus. But the blood pressure is still low at 70s. She was found to have elevated creatinine and BUN. The patient has presence of headache, dizziness and generalized weakness, but she denies any other symptoms. She was recently hospitalized and treated for left foot osteomyelitis.  PAST MEDICAL HISTORY:   Past Medical History  Diagnosis Date  . Hypertension   . Diabetes mellitus without complication (HCC)   . Coronary artery disease   . Low back pain   . Stroke (HCC)   . Myocardial infarction (HCC)   . Chronic back pain     PAST SURGICAL HISTORY:   Past Surgical History  Procedure Laterality Date  . Toe amputation    . 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 Cardiac catheterization N/A 11/12/2015    Procedure: Left Heart Cath and Coronary Angiography;  Surgeon: Marcina Millard, MD;  Location: [facility] INVASIVE CV LAB;  Service: Cardiovascular;  Laterality: N/A;    SOCIAL HISTORY:   Social History  Substance Use Topics  . Smoking status: Never Smoker   . Smokeless  tobacco: Not on file  . Alcohol Use: Not on file    FAMILY HISTORY:   Family History  Problem Relation Age of Onset  . CAD Mother   . CAD Father     DRUG ALLERGIES:   Allergies  Allergen Reactions  . Codeine Nausea And Vomiting  . Influenza Vaccines Other (See Comments)    Passed out  . Methadone Hives  . Percocet [Oxycodone-Acetaminophen] Nausea And Vomiting  . Tetanus Toxoids Swelling  . Tetracyclines & Related Rash    REVIEW OF SYSTEMS:  CONSTITUTIONAL: No fever, but has headache, dizziness and generalized weakness.  EYES: No blurred or double vision.  EARS, NOSE, AND THROAT: No tinnitus or ear pain.  RESPIRATORY: No cough, shortness of breath, wheezing or hemoptysis.  CARDIOVASCULAR: No chest pain, orthopnea, edema.  GASTROINTESTINAL: No nausea, vomiting, diarrhea or abdominal pain.  GENITOURINARY: No dysuria, hematuria.  ENDOCRINE: No polyuria, nocturia,  HEMATOLOGY: No anemia, easy bruising or bleeding SKIN: No rash or lesion. MUSCULOSKELETAL: No joint pain or arthritis.   NEUROLOGIC: No tingling, numbness, weakness.  PSYCHIATRY: No anxiety or depression.   MEDICATIONS AT HOME:   Prior to Admission medications   Medication Sig Start Date End Date Taking? Authorizing Provider  ALPRAZolam Prudy Feeler) 1 MG tablet Take 1 tablet (1 mg total) by mouth 2 (two) times daily as needed for anxiety. 11/12/15  Yes Srikar Sudini, MD  aspirin EC 81 MG tablet Take 81 mg by mouth  daily.   Yes Historical Provider, MD  atorvastatin (LIPITOR) 20 MG tablet Take 20 mg by mouth daily.   Yes Historical Provider, MD  diphenoxylate-atropine (LOMOTIL) 2.5-0.025 MG tablet Take 1 tablet by mouth 4 (four) times daily as needed for diarrhea or loose stools.   Yes Historical Provider, MD  ergocalciferol (VITAMIN D2) 50000 UNITS capsule Take 50,000 Units by mouth once a week. 11/10/15  Yes Historical Provider, MD  ferrous sulfate 325 (65 FE) MG tablet Take 325 mg by mouth daily with breakfast.    Yes Historical Provider, MD  furosemide (LASIX) 20 MG tablet Take 20 mg by mouth daily.   Yes Historical Provider, MD  gabapentin (NEURONTIN) 100 MG capsule Take 100 mg by mouth.   Yes Historical Provider, MD  insulin glargine (LANTUS) 100 UNIT/ML injection Inject 0.25 mLs (25 Units total) into the skin at bedtime. 11/12/15  Yes Srikar Sudini, MD  isosorbide mononitrate (IMDUR) 30 MG 24 hr tablet Take 30 mg by mouth daily.   Yes Historical Provider, MD  liothyronine (CYTOMEL) 25 MCG tablet Take 25 mcg by mouth daily.   Yes Historical Provider, MD  lisinopril (PRINIVIL,ZESTRIL) 20 MG tablet Take 20 mg by mouth daily.   Yes Historical Provider, MD  metoprolol (LOPRESSOR) 25 MG tablet Take 0.5 tablets (12.5 mg total) by mouth 2 (two) times daily. 11/12/15  Yes Srikar Sudini, MD  mirtazapine (REMERON) 15 MG tablet Take 15 mg by mouth at bedtime.   Yes Historical Provider, MD  morphine (MS CONTIN) 30 MG 12 hr tablet Take 1 tablet (30 mg total) by mouth 3 (three) times daily. 11/12/15  Yes Srikar Sudini, MD  morphine (MSIR) 15 MG tablet Take 1 tablet (15 mg total) by mouth every 6 (six) hours as needed for severe pain. 11/12/15  Yes Srikar Sudini, MD  nitroGLYCERIN (NITROSTAT) 0.4 MG SL tablet Place 0.4 mg under the tongue every 5 (five) minutes as needed for chest pain.   Yes Historical Provider, MD  omeprazole (PRILOSEC) 20 MG capsule Take 20 mg by mouth daily.   Yes Historical Provider, MD  pregabalin (LYRICA) 75 MG capsule Take 1 capsule (75 mg total) by mouth 2 (two) times daily. 11/12/15  Yes Houston Siren, MD  solifenacin (VESICARE) 10 MG tablet Take by mouth daily.   Yes Historical Provider, MD  ticagrelor (BRILINTA) 90 MG TABS tablet Take 90 mg by mouth 2 (two) times daily.   Yes Historical Provider, MD  tiZANidine (ZANAFLEX) 4 MG tablet Take 4 mg by mouth 3 (three) times daily.   Yes Historical Provider, MD  tolterodine (DETROL LA) 4 MG 24 hr capsule Take 4 mg by mouth daily.   Yes Historical  Provider, MD  traZODone (DESYREL) 100 MG tablet Take 100 mg by mouth at bedtime as needed for sleep.   Yes Historical Provider, MD  venlafaxine XR (EFFEXOR-XR) 150 MG 24 hr capsule Take 150 mg by mouth daily with breakfast.   Yes Historical Provider, MD  sulfamethoxazole-trimethoprim (BACTRIM DS,SEPTRA DS) 800-160 MG tablet Take 1 tablet by mouth every 12 (twelve) hours. 11/12/15   Srikar Sudini, MD      VITAL SIGNS:  Blood pressure 100/49, pulse 62, temperature 97.4 F (36.3 C), temperature source Rectal, resp. rate 16, height  (1.702 m), weight 77.111 kg (170 lb), SpO2 97 %.  PHYSICAL EXAMINATION:  GENERAL:  69 y.o.-year-old patient lying in the bed with no acute distress.  EYES: Pupils equal, round, reactive to light and accommodation. No scleral icterus.  Extraocular muscles intact.  HEENT: Head atraumatic, normocephalic. Oropharynx and nasopharynx clear. Dry oral mucosa. NECK:  Supple, no jugular venous distention. No thyroid enlargement, no tenderness.  LUNGS: Normal breath sounds bilaterally, no wheezing, rales,rhonchi or crepitation. No use of accessory muscles of respiration.  CARDIOVASCULAR: S1, S2 normal. No murmurs, rubs, or gallops.  ABDOMEN: Soft, nontender, nondistended. Bowel sounds present. No organomegaly or mass.  EXTREMITIES: No pedal edema, cyanosis, or clubbing. Left foot in dressing. NEUROLOGIC: Cranial nerves II through XII are intact. Muscle strength 4/5 in all extremities. Sensation intact. Gait not checked.  PSYCHIATRIC: The patient is alert and oriented x 3.  SKIN: No obvious rash, lesion, or ulcer.   LABORATORY PANEL:   CBC  Recent Labs Lab 11/29/15 1404  WBC 10.0  HGB 11.7*  HCT 36.1  PLT 229   ------------------------------------------------------------------------------------------------------------------  Chemistries   Recent Labs Lab 11/29/15 1404  NA 136  K 4.2  CL 101  CO2 26  GLUCOSE 167*  BUN 32*  CREATININE 1.87*  CALCIUM  9.3  AST 22  ALT 10*  ALKPHOS 64  BILITOT 0.5   ------------------------------------------------------------------------------------------------------------------  Cardiac Enzymes  Recent Labs Lab 11/29/15 1404  TROPONINI <0.03   ------------------------------------------------------------------------------------------------------------------  RADIOLOGY:  Dg Chest Portable 1 View  11/29/2015  CLINICAL DATA:  69 year old with acute mental status changes, patient incoherent while imaging. Current history of diabetes, hypertension. Personal history of stroke. EXAM: PORTABLE CHEST 1 VIEW COMPARISON:  None. FINDINGS: Markedly suboptimal inspiration with atelectasis at the lung bases, left greater than right. Cardiac silhouette likely mildly enlarged, allowing for the AP portable technique and degree of inspiration. Pulmonary vascularity normal. No pneumothorax. IMPRESSION: Markedly suboptimal inspiration accounting for bibasilar atelectasis, left greater than right. No acute cardiopulmonary disease otherwise. Electronically Signed   By: Hulan Saashomas  Lawrence M.D.   On: 11/29/2015 14:51    EKG:   Orders placed or performed during the hospital encounter of 11/29/15  . ED EKG  . ED EKG    IMPRESSION AND PLAN:   Acute renal failure. Due to prerenal etiology.  Hold Lasix and lisinopril, continue NS bolus.  IV fluid support with normal saline if blood pressure is stable, follow-up BMP.  Hypotension due to dehydration and renal failure. Hold hypertension medication and give IV fluid support. Hold pain medication.  Acute metabolic encephalopathy. Possible due to overdose of medication and Finnegan. Hold pain medication.  Diabetes. Start sliding scale.  CAD. Continue Brilinta.  All the records are reviewed and case discussed with ED provider. Management plans discussed with the patient, family and they are in agreement.  CODE STATUS: DO NOT RESUSCITATE  TOTAL CRITICAL TIME TAKING CARE OF  THIS PATIENT: 66 minutes.    Shaune Pollackhen, Layla Gramm M.D on 11/29/2015 at 4:12 PM  Between 7am to 6pm - Pager - [phone]  After 6pm go to [email or web address] - password [email or web address] [facility]  [facility]  Office  [phone]  CC: Primary care physician; Geoffery Lyonsurner, Eric M., PA

## 2015-11-29 NOTE — ED Notes (Signed)
As pt getting ready for transport, pt BP dropped to 90' and 85. MD Imogene Burnhen made aware, verbal order for fluids given at this time, SEE MAR. Josh RN made aware who verbalized understanding at this time.

## 2015-11-30 LAB — CBC
HEMATOCRIT: 33.3 % — AB (ref 35.0–47.0)
HEMOGLOBIN: 11.1 g/dL — AB (ref 12.0–16.0)
MCH: 32.2 pg (ref 26.0–34.0)
MCHC: 33.2 g/dL (ref 32.0–36.0)
MCV: 97 fL (ref 80.0–100.0)
Platelets: 181 10*3/uL (ref 150–440)
RBC: 3.43 MIL/uL — AB (ref 3.80–5.20)
RDW: 14.1 % (ref 11.5–14.5)
WBC: 4.4 10*3/uL (ref 3.6–11.0)

## 2015-11-30 LAB — GLUCOSE, CAPILLARY
GLUCOSE-CAPILLARY: 77 mg/dL (ref 65–99)
GLUCOSE-CAPILLARY: 141 mg/dL — AB (ref 65–99)
Glucose-Capillary: 89 mg/dL (ref 65–99)
Glucose-Capillary: 94 mg/dL (ref 65–99)

## 2015-11-30 LAB — BASIC METABOLIC PANEL
ANION GAP: 2 — AB (ref 5–15)
BUN: 20 mg/dL (ref 6–20)
CALCIUM: 8.3 mg/dL — AB (ref 8.9–10.3)
CO2: 25 mmol/L (ref 22–32)
Chloride: 115 mmol/L — ABNORMAL HIGH (ref 101–111)
Creatinine, Ser: 0.8 mg/dL (ref 0.44–1.00)
GFR calc non Af Amer: >60 mL/min (ref >60)
GLUCOSE: 86 mg/dL (ref 65–99)
POTASSIUM: 4.4 mmol/L (ref 3.5–5.1)
Sodium: 142 mmol/L (ref 135–145)

## 2015-11-30 MED ORDER — MORPHINE SULFATE ER 15 MG PO TBCR
30.0000 mg | EXTENDED_RELEASE_TABLET | Freq: Three times a day (TID) | ORAL | Status: DC
  Administered 2015-11-30 – 2015-12-01 (×3): 30 mg via ORAL
  Filled 2015-11-30 (×3): qty 2

## 2015-11-30 MED ORDER — MORPHINE SULFATE 15 MG PO TABS
15.0000 mg | ORAL_TABLET | Freq: Four times a day (QID) | ORAL | Status: DC
  Administered 2015-11-30 – 2015-12-01 (×5): 15 mg via ORAL
  Filled 2015-11-30 (×6): qty 1

## 2015-11-30 NOTE — Progress Notes (Signed)
Consult for dressing change.  Underwent amputation of3rd toe on 11/20.  Since surgery all sutures have been removed.  Seen in outpt clinic for f/u.  Most recent f/u missed secondary to this admission. Per most recent outpt note pts wound is stable and nursing can change dressing.  I spoke to nursing to perform dry bulky gauze dressing change and to call me with any concerns.  Pt can f/u outpt with Dr. Alberteen Spindleline.

## 2015-11-30 NOTE — Progress Notes (Addendum)
Pt states that tylenol does not help her chronic pain she is having, that it will only help her headaches. She states that she has taken morphine daily for years and that it is the only medication that helps ease her pain.    Karsten RoLauren E Hobbs

## 2015-11-30 NOTE — Progress Notes (Signed)
Patient ID: Jessica Donovan, female   DOB: 1946-07-25, 69 y.o.   MRN: [id number] [facility] PROGRESS NOTE  PCP: Geoffery Lyons., PA  HPI/Subjective: Patient feeling a little bit better today. Prior to coming in had decreased urination and she passed out at the physician's office. She is stating that she is in pain and needs her pain medications back.  Objective: Filed Vitals:   11/30/15 1122 11/30/15 1124  BP: 120/48 127/56  Pulse: 72 66  Temp:    Resp:      Filed Weights   11/29/15 1358  Weight: 77.111 kg (170 lb)    ROS: Review of Systems  Constitutional: Negative for fever and chills.  Eyes: Negative for blurred vision.  Respiratory: Negative for cough and shortness of breath.   Cardiovascular: Negative for chest pain.  Gastrointestinal: Negative for nausea, vomiting, abdominal pain, diarrhea and constipation.  Genitourinary: Negative for dysuria.  Musculoskeletal: Positive for back pain. Negative for joint pain.  Neurological: Negative for dizziness and headaches.   Exam: Physical Exam  Constitutional: She is oriented to person, place, and time.  HENT:  Nose: No mucosal edema.  Mouth/Throat: No oropharyngeal exudate or posterior oropharyngeal edema.  Eyes: Conjunctivae, EOM and lids are normal. Pupils are equal, round, and reactive to light.  Neck: No JVD present. Carotid bruit is not present. No edema present. No thyroid mass and no thyromegaly present.  Cardiovascular: S1 normal and S2 normal.  Exam reveals no gallop.   Murmur heard.  Systolic murmur is present with a grade of 2/6  Pulses:      Dorsalis pedis pulses are 2+ on the right side, and 2+ on the left side.  Respiratory: No respiratory distress. She has no wheezes. She has no rhonchi. She has no rales.  GI: Soft. Bowel sounds are normal. There is no tenderness.  Musculoskeletal:       Right ankle: She exhibits swelling.       Left ankle: She exhibits swelling.  Lymphadenopathy:    She has  no cervical adenopathy.  Neurological: She is alert and oriented to person, place, and time. No cranial nerve deficit.  Skin: Skin is warm. No rash noted. Nails show no clubbing.  Psychiatric: She has a normal mood and affect.    Data Reviewed: Basic Metabolic Panel:  Recent Labs Lab 11/29/15 1404 11/29/15 1839 11/30/15 0700  NA 136  --  142  K 4.2  --  4.4  CL 101  --  115*  CO2 26  --  25  GLUCOSE 167*  --  86  BUN 32*  --  20  CREATININE 1.87*  --  0.80  CALCIUM 9.3  --  8.3*  MG  --  1.8  --    Liver Function Tests:  Recent Labs Lab 11/29/15 1404  AST 22  ALT 10*  ALKPHOS 64  BILITOT 0.5  PROT 6.4*  ALBUMIN 3.7    CBC:  Recent Labs Lab 11/29/15 1404 11/30/15 0700  WBC 10.0 4.4  NEUTROABS 5.9  --   HGB 11.7* 11.1*  HCT 36.1 33.3*  MCV 95.9 97.0  PLT 229 181    CBG:  Recent Labs Lab 11/29/15 1358 11/29/15 1826 11/29/15 2114 11/30/15 0755 11/30/15 1130  GLUCAP 159* 135* 133* 77 89     Studies: Dg Chest Portable 1 View  11/29/2015  CLINICAL DATA:  69 year old with acute mental status changes, patient incoherent while imaging. Current history of diabetes, hypertension. Personal history of stroke. EXAM:  PORTABLE CHEST 1 VIEW COMPARISON:  None. FINDINGS: Markedly suboptimal inspiration with atelectasis at the lung bases, left greater than right. Cardiac silhouette likely mildly enlarged, allowing for the AP portable technique and degree of inspiration. Pulmonary vascularity normal. No pneumothorax. IMPRESSION: Markedly suboptimal inspiration accounting for bibasilar atelectasis, left greater than right. No acute cardiopulmonary disease otherwise. Electronically Signed   By: Hulan Saashomas  Lawrence M.D.   On: 11/29/2015 14:51    Scheduled Meds: . aspirin EC  81 mg Oral Daily  . atorvastatin  20 mg Oral Daily  . ferrous sulfate  325 mg Oral Q breakfast  . fesoterodine  4 mg Oral Daily  . heparin  5,000 Units Subcutaneous 3 times per day  . insulin aspart   0-5 Units Subcutaneous QHS  . insulin aspart  0-9 Units Subcutaneous TID WC  . insulin glargine  25 Units Subcutaneous QHS  . mirtazapine  15 mg Oral QHS  . morphine  30 mg Oral 3 times per day  . morphine  15 mg Oral QID  . pantoprazole  40 mg Oral Daily  . pregabalin  75 mg Oral BID  . ticagrelor  90 mg Oral BID  . venlafaxine XR  150 mg Oral Q breakfast   Continuous Infusions: . sodium chloride 50 mL/hr at 11/30/15 1030    Assessment/Plan:  1. Hypotension unspecified. Continue IV fluid hydration. Check orthostatic vital signs. Stop Zanaflex which can cause severe hypotension. Lasix and metoprolol also held. 2. Acute renal failure- improved with IV fluid hydration. Decrease rate of IV fluids. 3. Acute encephalopathy- likely with hypotension and poor perfusion. 4. Chronic pain syndrome- restart the patient's chronic pain medications. Stop Zanaflex. 5. Type 2 diabetes mellitus- sliding scale and Lantus 6. History of stroke and heart disease- on aspirin and ticagrelor 7. Hyperlipidemia unspecified continue atorvastatin 8. Gastroesophageal reflux disease without esophagitis on Protonix 9. Recent surgery on left third toe- patient requesting podiatry to change the bandage  Code Status:     Code Status Orders        Start     Ordered   11/29/15 1742  Do not attempt resuscitation (DNR)   Continuous    Question Answer Comment  In the event of cardiac or respiratory ARREST Do not call a "code blue"   In the event of cardiac or respiratory ARREST Do not perform Intubation, CPR, defibrillation or ACLS   In the event of cardiac or respiratory ARREST Use medication by any route, position, wound care, and other measures to relive pain and suffering. May use oxygen, suction and manual treatment of airway obstruction as needed for comfort.      11/29/15 1741    Advance Directive Documentation        Most Recent Value   Type of Advance Directive  Living will, Healthcare Power of  Attorney   Pre-existing out of facility DNR order (yellow form or pink MOST form)     "MOST" Form in Place?        Disposition Plan: Home soon  Consultants:  Podiatry  Time spent: 25 minutes  Alford HighlandWIETING, Shaneen Reeser  [facility]

## 2015-11-30 NOTE — Progress Notes (Signed)
Dr. Renae GlossWieting notified of orthostatic vitals. No new orders received. Primary nurse to continue to monitor.

## 2015-11-30 NOTE — Progress Notes (Signed)
Initial Nutrition Assessment    INTERVENTION:   Meals and Snacks: Cater to patient preferences   NUTRITION DIAGNOSIS:   No nutrition diagnosis at this time  GOAL:   Patient will meet greater than or equal to 90% of their needs  MONITOR:    (Energy Intake, Anthropometrics, Electrolyte/Renal Profile, Glucose Profile)  REASON FOR ASSESSMENT:   Diagnosis    ASSESSMENT:    Pt admitted with urinary retention, ARF   Past Medical History  Diagnosis Date  . Hypertension   . Diabetes mellitus without complication (HCC)   . Coronary artery disease   . Low back pain   . Stroke (HCC)   . Myocardial infarction (HCC)   . Chronic back pain     Diet Order:  Diet heart healthy/carb modified Room service appropriate?: Yes; Fluid consistency:: Thin  Energy Intake: pt reports appetite is good when she get something she likes to eat on meal tray; pt ate toast and some potatoes off meal tray but did not eat eggs as she does not like  Food and nutrition related history: pt reports good appetite prior to admission  Electrolyte and Renal Profile:  Recent Labs Lab 11/29/15 1404 11/29/15 1839 11/30/15 0700  BUN 32*  --  20  CREATININE 1.87*  --  0.80  NA 136  --  142  K 4.2  --  4.4  MG  --  1.8  --    Glucose Profile:  Recent Labs  11/29/15 1826 11/29/15 2114 11/30/15 0755  GLUCAP 135* 133* 77   Meds: lantus, remeron, ss novolog  Height:   Ht Readings from Last 1 Encounters:  11/29/15 5\' 7"  (1.702 m)    Weight: pt reports stable weight  Wt Readings from Last 1 Encounters:  11/29/15 170 lb (77.111 kg)   BMI:  Body mass index is 26.62 kg/(m^2).   LOW Care Level  Romelle Starcherate Saleh Ulbrich MS, [place]RD, LDN [phone] Pager

## 2015-11-30 NOTE — Evaluation (Signed)
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 Patient experienced severe hypotension spell while following up at [facility] clinic and subsequently was admitted.   Clinical Impression  Patient was recently discharged home from SNF, when she had a hypotensive episode during an orthopedic follow up. She demonstrates no assistance required with bed mobility or transfers and though ambulation was minimized as patient did not want to go into the hallway, she did not fatigue or lose balance with RW. Patient did not demonstrate any orthostatics on this session and room air was used for ambulation with no shortness of breath. Patient does demonstrate mild cognitive deficits and short term memory loss, which she attributes to a previous CVA. At this time she appears near her recent baseline when she was discharged home, and would benefit from continued PT services to increase her balance and tolerance for mobility.     Follow Up Recommendations Home health PT    Equipment Recommendations  Rolling walker with 5" wheels    Recommendations for Other Services       Precautions / Restrictions Precautions Precautions: Fall Restrictions Weight Bearing Restrictions: Yes Other Position/Activity Restrictions: No formal WB orders, patient has been ambulating in surgical shoe at rehab.       Mobility  Bed Mobility Overal bed mobility: Modified Independent             General bed mobility comments: Patient uses hand rails to complete transfer.   Transfers Overall transfer level: Needs assistance Equipment used: Rolling walker (2 wheeled) Transfers: Sit to/from Stand Sit to Stand: Supervision         General transfer comment: Patient uses hand rails on bed and is  somewhat impulsive with transfer, no loss of balance.   Ambulation/Gait Ambulation/Gait assistance: Modified independent (Device/Increase time) Ambulation Distance (Feet): 40 Feet Assistive device: Rolling walker (2 wheeled)   Gait velocity: reduced   General Gait Details: Patient ambulates with surgical shoe in place, she demonstrates no loss of balance and appropriate step through technique.   Stairs            Wheelchair Mobility    Modified Rankin (Stroke Patients Only)       Balance Overall balance assessment: Needs assistance Sitting-balance support: No upper extremity supported Sitting balance-Leahy Scale: Good Sitting balance - Comments: No balance deficits in sitting.    Standing balance support: Bilateral upper extremity supported Standing balance-Leahy Scale: Fair Standing balance comment: Patient demonstrates no loss of balance, unable to formally assess as patient did not want to ambulate into hallway.                              Pertinent Vitals/Pain Pain Assessment: No/denies pain (Denies chest pain)    Home Living Family/patient expects to be discharged to:: Private residence Living Arrangements: Alone   Type of Home:  (Per patient it sounds like she may have some assistance in her living community?) Home Access: Ramped entrance     Home Layout: One level Home Equipment: Cane - single point;Walker - 2 wheels      Prior Function Level of Independence: Independent with assistive device(s)         Comments: Patient has been ambulating with RW at SNF and  recently at home. Denies any falls.      Hand Dominance        Extremity/Trunk Assessment   Upper Extremity Assessment: Overall WFL for tasks assessed           Lower Extremity Assessment: Overall WFL for tasks assessed         Communication   Communication: No difficulties (Short term memory loss)  Cognition Arousal/Alertness: Awake/alert Behavior During  Therapy: WFL for tasks assessed/performed;Flat affect Overall Cognitive Status: Within Functional Limits for tasks assessed                      General Comments      Exercises        Assessment/Plan    PT Assessment Patient needs continued PT services  PT Diagnosis Difficulty walking;Abnormality of gait   PT Problem List Decreased strength;Decreased mobility;Decreased safety awareness;Decreased activity tolerance;Cardiopulmonary status limiting activity;Decreased knowledge of use of DME;Decreased balance  PT Treatment Interventions DME instruction;Therapeutic activities;Therapeutic exercise;Gait training;Stair training;Balance training;Neuromuscular re-education   PT Goals (Current goals can be found in the Care Plan section) Acute Rehab PT Goals Patient Stated Goal: To return home.  PT Goal Formulation: With patient Time For Goal Achievement: 12/14/15 Potential to Achieve Goals: Good    Frequency Min 2X/week   Barriers to discharge Decreased caregiver support Patient lives alone with a history of falls.     Co-evaluation               End of Session Equipment Utilized During Treatment: Gait belt Activity Tolerance: Patient tolerated treatment well Patient left: in chair;with chair alarm set;with call bell/phone within reach Nurse Communication: Mobility status         Time: [id number] PT Time Calculation (min) (ACUTE ONLY): 25 min   Charges:   PT Evaluation $Initial PT Evaluation Tier I: 1 Procedure     PT G Codes:       Kerin RansomPatrick A McNamara, PT, DPT    11/30/2015, 5:00 PM

## 2015-11-30 NOTE — Progress Notes (Signed)
Orders received from Dr. Ether GriffinsFowler Via phone to change dressing on foot with fluffy gauze wrapped in ace wrap.

## 2015-12-01 LAB — GLUCOSE, CAPILLARY
GLUCOSE-CAPILLARY: 76 mg/dL (ref 65–99)
Glucose-Capillary: 133 mg/dL — ABNORMAL HIGH (ref 65–99)

## 2015-12-01 LAB — BASIC METABOLIC PANEL
ANION GAP: 5 (ref 5–15)
BUN: 14 mg/dL (ref 6–20)
CALCIUM: 8.6 mg/dL — AB (ref 8.9–10.3)
CO2: 24 mmol/L (ref 22–32)
Chloride: 114 mmol/L — ABNORMAL HIGH (ref 101–111)
Creatinine, Ser: 0.64 mg/dL (ref 0.44–1.00)
GFR calc Af Amer: >60 mL/min (ref >60)
Glucose, Bld: 89 mg/dL (ref 65–99)
POTASSIUM: 4 mmol/L (ref 3.5–5.1)
SODIUM: 143 mmol/L (ref 135–145)

## 2015-12-01 MED ORDER — CYCLOBENZAPRINE HCL 5 MG PO TABS: 5.0000 mg | ORAL_TABLET | Freq: Three times a day (TID) | ORAL | Status: DC | PRN

## 2015-12-01 NOTE — Discharge Summary (Signed)
[facility] [place] [facility]   PATIENT NAME: Jessica DanceBarbara Donovan    MR#:  [id number]  DATE OF BIRTH:  07-18-1946  DATE OF ADMISSION:  11/29/2015 ADMITTING PHYSICIAN: Shaune PollackQing Chen, MD  DATE OF DISCHARGE: 12/01/2015  PRIMARY CARE PHYSICIAN: Einar CrowMarshall Anderson   ADMISSION DIAGNOSIS:  Renal failure [N19]  DISCHARGE DIAGNOSIS:  Principal Problem:   ARF (acute renal failure) (HCC) Active Problems:   Hypotension   SECONDARY DIAGNOSIS:   Past Medical History  Diagnosis Date  . Hypertension   . Diabetes mellitus without complication (HCC)   . Coronary artery disease   . Low back pain   . Stroke (HCC)   . Myocardial infarction (HCC)   . Chronic back pain     HOSPITAL COURSE:   1. Acute renal failure, likely ATN with hypotension- this has improved with IV fluid hydration. 2. Hypotension- likely combination of being on blood pressure medications and Zanaflex and Bactrim. With holding all of these medications her blood pressure has come up. Patient nervous about going back on blood pressure medications at this time. She has a blood pressure monitor at home and if systolic blood pressure goes above 160 she can restart her metoprolol. If then persistently above 160 can go back on her Lasix and lisinopril. I stopped Zanaflex and Bactrim. 3. Chronic pain syndrome- continued her usual medications 4. Acute encephalopathy- likely secondary to hypotension and poor perfusion 5. Weakness- physical therapy recommended home with home health. 6. Recent amputation of third toe- follow-up with Dr. Alberteen Spindleline as outpatient 7. Type 2 diabetes mellitus- continue home regimen 8. History of CAD  DISCHARGE CONDITIONS:   Satisfactory  CONSULTS OBTAINED:  Treatment Team:  Gwyneth RevelsJustin Fowler, DPM  DRUG ALLERGIES:   Allergies  Allergen Reactions  . Codeine Nausea And Vomiting  . Influenza Vaccines Other (See Comments)    Passed out  . Methadone Hives  . Percocet  [Oxycodone-Acetaminophen] Nausea And Vomiting  . Tetanus Toxoids Swelling  . Tetracyclines & Related Rash    DISCHARGE MEDICATIONS:   Current Discharge Medication List    START taking these medications   Details  cyclobenzaprine (FLEXERIL) 5 MG tablet Take 1 tablet (5 mg total) by mouth 3 (three) times daily as needed for muscle spasms. Qty: 30 tablet, Refills: 0      CONTINUE these medications which have NOT CHANGED   Details  ALPRAZolam (XANAX) 1 MG tablet Take 1 tablet (1 mg total) by mouth 2 (two) times daily as needed for anxiety. Qty: 20 tablet, Refills: 0    aspirin EC 81 MG tablet Take 81 mg by mouth daily.    atorvastatin (LIPITOR) 20 MG tablet Take 20 mg by mouth daily.    diphenoxylate-atropine (LOMOTIL) 2.5-0.025 MG tablet Take 1 tablet by mouth 4 (four) times daily as needed for diarrhea or loose stools.    ergocalciferol (VITAMIN D2) 50000 UNITS capsule Take 50,000 Units by mouth once a week.    ferrous sulfate 325 (65 FE) MG tablet Take 325 mg by mouth daily with breakfast.    gabapentin (NEURONTIN) 100 MG capsule Take 100 mg by mouth.    insulin glargine (LANTUS) 100 UNIT/ML injection Inject 0.25 mLs (25 Units total) into the skin at bedtime. Qty: 10 mL, Refills: 11    isosorbide mononitrate (IMDUR) 30 MG 24 hr tablet Take 30 mg by mouth daily.    liothyronine (CYTOMEL) 25 MCG tablet Take 25 mcg by mouth daily.    mirtazapine (REMERON) 15 MG tablet Take  15 mg by mouth at bedtime.    morphine (MS CONTIN) 30 MG 12 hr tablet Take 1 tablet (30 mg total) by mouth 3 (three) times daily. Qty: 30 tablet, Refills: 0    morphine (MSIR) 15 MG tablet Take 1 tablet (15 mg total) by mouth every 6 (six) hours as needed for severe pain. Qty: 30 tablet, Refills: 0    nitroGLYCERIN (NITROSTAT) 0.4 MG SL tablet Place 0.4 mg under the tongue every 5 (five) minutes as needed for chest pain.    omeprazole (PRILOSEC) 20 MG capsule Take 20 mg by mouth daily.    pregabalin  (LYRICA) 75 MG capsule Take 1 capsule (75 mg total) by mouth 2 (two) times daily. Qty: 60 capsule, Refills: 0    solifenacin (VESICARE) 10 MG tablet Take by mouth daily.    ticagrelor (BRILINTA) 90 MG TABS tablet Take 90 mg by mouth 2 (two) times daily.    tolterodine (DETROL LA) 4 MG 24 hr capsule Take 4 mg by mouth daily.    traZODone (DESYREL) 100 MG tablet Take 100 mg by mouth at bedtime as needed for sleep.    venlafaxine XR (EFFEXOR-XR) 150 MG 24 hr capsule Take 150 mg by mouth daily with breakfast.      STOP taking these medications     furosemide (LASIX) 20 MG tablet      lisinopril (PRINIVIL,ZESTRIL) 20 MG tablet      metoprolol (LOPRESSOR) 25 MG tablet      tiZANidine (ZANAFLEX) 4 MG tablet      sulfamethoxazole-trimethoprim (BACTRIM DS,SEPTRA DS) 800-160 MG tablet          DISCHARGE INSTRUCTIONS:   Follow-up this week with Dr. Einar Crow  If you experience worsening of your admission symptoms, develop shortness of breath, life threatening emergency, suicidal or homicidal thoughts you must seek medical attention immediately by calling 911 or calling your MD immediately  if symptoms less severe.  You Must read complete instructions/literature along with all the possible adverse reactions/side effects for all the Medicines you take and that have been prescribed to you. Take any new Medicines after you have completely understood and accept all the possible adverse reactions/side effects.   Please note  You were cared for by a hospitalist during your hospital stay. If you have any questions about your discharge medications or the care you received while you were in the hospital after you are discharged, you can call the unit and asked to speak with the hospitalist on call if the hospitalist that took care of you is not available. Once you are discharged, your primary care physician will handle any further medical issues. Please note that NO REFILLS for any  discharge medications will be authorized once you are discharged, as it is imperative that you return to your primary care physician (or establish a relationship with a primary care physician if you do not have one) for your aftercare needs so that they can reassess your need for medications and monitor your lab values.    Today   CHIEF COMPLAINT:   Chief Complaint  Patient presents with  . Urinary Retention  . Hypotension    HISTORY OF PRESENT ILLNESS:  Jessica Donovan  is a 69 y.o. female presented with hypotension and acute renal failure and altered mental status.  VITAL SIGNS:  Blood pressure 140/52, pulse 62, temperature 98.2 F (36.8 C), temperature source Oral, resp. rate 18, height  (1.702 m), weight 77.111 kg (170 lb), SpO2 94 %.  PHYSICAL EXAMINATION:  GENERAL:  69 y.o.-year-old patient lying in the bed with no acute distress.  EYES: Pupils equal, round, reactive to light and accommodation. No scleral icterus. Extraocular muscles intact.  HEENT: Head atraumatic, normocephalic. Oropharynx and nasopharynx clear.  NECK:  Supple, no jugular venous distention. No thyroid enlargement, no tenderness.  LUNGS: Normal breath sounds bilaterally, no wheezing, rales,rhonchi or crepitation. No use of accessory muscles of respiration.  CARDIOVASCULAR: S1, S2 normal. No murmurs, rubs, or gallops.  ABDOMEN: Soft, non-tender, non-distended. Bowel sounds present. No organomegaly or mass.  EXTREMITIES: Trace pedal edema, no cyanosis, or clubbing.  NEUROLOGIC: Cranial nerves II through XII are intact. Muscle strength 5/5 in all extremities. Sensation intact. Gait not checked.  PSYCHIATRIC: The patient is alert and oriented x 3.  SKIN: Left foot bandaged  DATA REVIEW:   CBC  Recent Labs Lab 11/30/15 0700  WBC 4.4  HGB 11.1*  HCT 33.3*  PLT 181    Chemistries   Recent Labs Lab 11/29/15 1404 11/29/15 1839  12/01/15 0532  NA 136  --   < > 143  K 4.2  --   < > 4.0  CL  101  --   < > 114*  CO2 26  --   < > 24  GLUCOSE 167*  --   < > 89  BUN 32*  --   < > 14  CREATININE 1.87*  --   < > 0.64  CALCIUM 9.3  --   < > 8.6*  MG  --  1.8  --   --   AST 22  --   --   --   ALT 10*  --   --   --   ALKPHOS 64  --   --   --   BILITOT 0.5  --   --   --   < > = values in this interval not displayed.  Cardiac Enzymes  Recent Labs Lab 11/29/15 1404  TROPONINI <0.03    RADIOLOGY:  Dg Chest Portable 1 View  11/29/2015  CLINICAL DATA:  69 year old with acute mental status changes, patient incoherent while imaging. Current history of diabetes, hypertension. Personal history of stroke. EXAM: PORTABLE CHEST 1 VIEW COMPARISON:  None. FINDINGS: Markedly suboptimal inspiration with atelectasis at the lung bases, left greater than right. Cardiac silhouette likely mildly enlarged, allowing for the AP portable technique and degree of inspiration. Pulmonary vascularity normal. No pneumothorax. IMPRESSION: Markedly suboptimal inspiration accounting for bibasilar atelectasis, left greater than right. No acute cardiopulmonary disease otherwise. Electronically Signed   By: Hulan Saas M.D.   On: 11/29/2015 14:51    Management plans discussed with the patient, and she is in agreement.  CODE STATUS:     Code Status Orders        Start     Ordered   11/29/15 1742  Do not attempt resuscitation (DNR)   Continuous    Question Answer Comment  In the event of cardiac or respiratory ARREST Do not call a "code blue"   In the event of cardiac or respiratory ARREST Do not perform Intubation, CPR, defibrillation or ACLS   In the event of cardiac or respiratory ARREST Use medication by any route, position, wound care, and other measures to relive pain and suffering. May use oxygen, suction and manual treatment of airway obstruction as needed for comfort.      11/29/15 1741    Advance Directive Documentation        Most Recent  Value   Type of Advance Directive  Living will,  Healthcare Power of Attorney   Pre-existing out of facility DNR order (yellow form or pink MOST form)     "MOST" Form in Place?        TOTAL TIME TAKING CARE OF THIS PATIENT: 35 minutes.    Alford Highland M.D on 12/01/2015 at 9:18 AM  Between 7am to 6pm - Pager - [phone]  After 6pm go to [email or web address] - password [email or web address] [facility]  [place] [facility]  Office  [phone]  CC: Primary care physician; Einar Crow

## 2015-12-01 NOTE — Care Management Note (Signed)
Case Management Note  Patient Details  Name: Mellody DanceBarbara Pinkley MRN: [id number] Date of Birth: May 16, 1946  Subjective/Objective:      Discussed discharge planning with Ms Windle. Per her request, [name] was provided with a list of agencies who provide home Aid services per her statement "I need the nurse aid to take care of me and to do light housekeeping." Explained to [name] that the home health referral she was receiving today was for a limited time and would be for PT, RN, Aid. A referral will be faxed to the home health provider of Ms Gawthrop's choice. [name] has the phone number of this Clinical research associatewriter and will call with her choice of providers either today or tomorrow at which time a home health referral will be made by this Clinical research associatewriter.               Action/Plan:   Expected Discharge Date:                  Expected Discharge Plan:     In-House Referral:     Discharge planning Services     Post Acute Care Choice:    Choice offered to:     DME Arranged:    DME Agency:     HH Arranged:    HH Agency:     Status of Service:     Medicare Important Message Given:    Date Medicare IM Given:    Medicare IM give by:    Date Additional Medicare IM Given:    Additional Medicare Important Message give by:     If discussed at Long Length of Stay Meetings, dates discussed:    Additional Comments:  Va Broadwell A, RN 12/01/2015, 11:12 AM

## 2015-12-01 NOTE — Progress Notes (Signed)
12/01/2015 14:00  [name] to be D/C'd Home per MD order.  Discussed prescriptions and follow up appointments with the patient. Prescriptions given to patient, medication list explained in detail. Pt verbalized understanding.    Medication List    STOP taking these medications        furosemide 20 MG tablet  Commonly known as:  LASIX     lisinopril 20 MG tablet  Commonly known as:  PRINIVIL,ZESTRIL     metoprolol tartrate 25 MG tablet  Commonly known as:  LOPRESSOR     sulfamethoxazole-trimethoprim 800-160 MG tablet  Commonly known as:  BACTRIM DS,SEPTRA DS     tiZANidine 4 MG tablet  Commonly known as:  ZANAFLEX      TAKE these medications        ALPRAZolam 1 MG tablet  Commonly known as:  XANAX  Take 1 tablet (1 mg total) by mouth 2 (two) times daily as needed for anxiety.     aspirin EC 81 MG tablet  Take 81 mg by mouth daily.     atorvastatin 20 MG tablet  Commonly known as:  LIPITOR  Take 20 mg by mouth daily.     diphenoxylate-atropine 2.5-0.025 MG tablet  Commonly known as:  LOMOTIL  Take 1 tablet by mouth 4 (four) times daily as needed for diarrhea or loose stools.     ergocalciferol 50000 UNITS capsule  Commonly known as:  VITAMIN D2  Take 50,000 Units by mouth once a week.     ferrous sulfate 325 (65 FE) MG tablet  Take 325 mg by mouth daily with breakfast.     gabapentin 100 MG capsule  Commonly known as:  NEURONTIN  Take 100 mg by mouth.     insulin glargine 100 UNIT/ML injection  Commonly known as:  LANTUS  Inject 0.25 mLs (25 Units total) into the skin at bedtime.     isosorbide mononitrate 30 MG 24 hr tablet  Commonly known as:  IMDUR  Take 30 mg by mouth daily.     liothyronine 25 MCG tablet  Commonly known as:  CYTOMEL  Take 25 mcg by mouth daily.     mirtazapine 15 MG tablet  Commonly known as:  REMERON  Take 15 mg by mouth at bedtime.     morphine 15 MG tablet  Commonly known as:  MSIR  Take 1 tablet (15 mg total) by  mouth every 6 (six) hours as needed for severe pain.     morphine 30 MG 12 hr tablet  Commonly known as:  MS CONTIN  Take 1 tablet (30 mg total) by mouth 3 (three) times daily.     nitroGLYCERIN 0.4 MG SL tablet  Commonly known as:  NITROSTAT  Place 0.4 mg under the tongue every 5 (five) minutes as needed for chest pain.     omeprazole 20 MG capsule  Commonly known as:  PRILOSEC  Take 20 mg by mouth daily.     pregabalin 75 MG capsule  Commonly known as:  LYRICA  Take 1 capsule (75 mg total) by mouth 2 (two) times daily.     solifenacin 10 MG tablet  Commonly known as:  VESICARE  Take by mouth daily.     ticagrelor 90 MG Tabs tablet  Commonly known as:  BRILINTA  Take 90 mg by mouth 2 (two) times daily.     tolterodine 4 MG 24 hr capsule  Commonly known as:  DETROL LA  Take 4 mg by mouth daily.  traZODone 100 MG tablet  Commonly known as:  DESYREL  Take 100 mg by mouth at bedtime as needed for sleep.     venlafaxine XR 150 MG 24 hr capsule  Commonly known as:  EFFEXOR-XR  Take 150 mg by mouth daily with breakfast.        Filed Vitals:   11/30/15 1942 12/01/15 0543  BP: 165/61 140/52  Pulse: 80 62  Temp: 98.4 F (36.9 C) 98.2 F (36.8 C)  Resp: 18 18    Skin clean, dry and intact without evidence of skin break down, no evidence of skin tears noted. IV catheter discontinued intact. Site without signs and symptoms of complications. Dressing and pressure applied. Pt denies pain at this time. No complaints noted.  An After Visit Summary was printed and given to the patient. Patient escorted via WC, and D/C home via private auto.  Bradly Chrisougherty, Alexandre Faries E

## 2015-12-01 NOTE — Discharge Instructions (Signed)
If Systolic Blood pressure starts going above 160 then you can restart metoprolol.  If then stays above 160 then can go back on the lasix and lisinopril.

## 2015-12-02 NOTE — Progress Notes (Signed)
Resume Home Health Orders with [facility]. Discharge home with home health PT, RN, Nurse Aid. V.O.: Dr Gerlene Burdockichard Renae GlossWieting, Bryan LemmaMarilyn Lenzi Marmo, RN, BSN on 12/02/15 at 12:15pm.

## 2015-12-02 NOTE — Care Management Note (Signed)
Case Management Note  Patient Details  Name: Mellody DanceBarbara Knappenberger MRN: [id number] Date of Birth: 1946/10/12  Subjective/Objective:     [name] called to report that she is an active patient of [facility]. This Clinical research associatewriter verified Mrs Daine GipGlass's status with [facility] and faxed all discharge information and resumption of care orders to [facility] today after the call from [name] was received.                Action/Plan:   Expected Discharge Date:                  Expected Discharge Plan:     In-House Referral:     Discharge planning Services     Post Acute Care Choice:    Choice offered to:     DME Arranged:    DME Agency:     HH Arranged:    HH Agency:     Status of Service:     Medicare Important Message Given:    Date Medicare IM Given:    Medicare IM give by:    Date Additional Medicare IM Given:    Additional Medicare Important Message give by:     If discussed at Long Length of Stay Meetings, dates discussed:    Additional Comments:  Farmer Mccahill A, RN 12/02/2015, 12:27 PM

## 2015-12-04 LAB — BLOOD GAS, VENOUS
Acid-base deficit: 0.6 mmol/L (ref 0.0–2.0)
BICARBONATE: 27.3 meq/L (ref 21.0–28.0)
PCO2 VEN: 58 mmHg (ref 44.0–60.0)
PH VEN: 7.28 — AB (ref 7.320–7.430)
Patient temperature: 37

## 2015-12-07 ENCOUNTER — Inpatient Hospital Stay
Admission: EM | Admit: 2015-12-07 | Discharge: 2015-12-09 | DRG: 558 | Disposition: A | Discharge status: Home / Self Care | Payer: Medicare Other | Attending: Internal Medicine | Admitting: Internal Medicine

## 2015-12-07 ENCOUNTER — Emergency Department: Payer: Medicare Other

## 2015-12-07 ENCOUNTER — Encounter: Payer: Self-pay | Admitting: *Deleted

## 2015-12-07 DIAGNOSIS — I252 Old myocardial infarction: Secondary | ICD-10-CM | POA: Diagnosis not present

## 2015-12-07 DIAGNOSIS — F329 Major depressive disorder, single episode, unspecified: Secondary | ICD-10-CM | POA: Diagnosis present

## 2015-12-07 DIAGNOSIS — M545 Low back pain: Secondary | ICD-10-CM | POA: Diagnosis present

## 2015-12-07 DIAGNOSIS — M6282 Rhabdomyolysis: Secondary | ICD-10-CM | POA: Diagnosis present

## 2015-12-07 DIAGNOSIS — Z8249 Family history of ischemic heart disease and other diseases of the circulatory system: Secondary | ICD-10-CM

## 2015-12-07 DIAGNOSIS — E119 Type 2 diabetes mellitus without complications: Secondary | ICD-10-CM | POA: Diagnosis present

## 2015-12-07 DIAGNOSIS — I2510 Atherosclerotic heart disease of native coronary artery without angina pectoris: Secondary | ICD-10-CM | POA: Diagnosis present

## 2015-12-07 DIAGNOSIS — Z887 Allergy status to serum and vaccine status: Secondary | ICD-10-CM

## 2015-12-07 DIAGNOSIS — Z9889 Other specified postprocedural states: Secondary | ICD-10-CM | POA: Diagnosis not present

## 2015-12-07 DIAGNOSIS — Z888 Allergy status to other drugs, medicaments and biological substances status: Secondary | ICD-10-CM | POA: Diagnosis not present

## 2015-12-07 DIAGNOSIS — T402X5A Adverse effect of other opioids, initial encounter: Secondary | ICD-10-CM | POA: Diagnosis present

## 2015-12-07 DIAGNOSIS — N179 Acute kidney failure, unspecified: Secondary | ICD-10-CM | POA: Diagnosis present

## 2015-12-07 DIAGNOSIS — Z7982 Long term (current) use of aspirin: Secondary | ICD-10-CM

## 2015-12-07 DIAGNOSIS — I959 Hypotension, unspecified: Secondary | ICD-10-CM | POA: Diagnosis present

## 2015-12-07 DIAGNOSIS — Z8673 Personal history of transient ischemic attack (TIA), and cerebral infarction without residual deficits: Secondary | ICD-10-CM

## 2015-12-07 DIAGNOSIS — Z89422 Acquired absence of other left toe(s): Secondary | ICD-10-CM

## 2015-12-07 DIAGNOSIS — Z886 Allergy status to analgesic agent status: Secondary | ICD-10-CM

## 2015-12-07 DIAGNOSIS — T50901A Poisoning by unspecified drugs, medicaments and biological substances, accidental (unintentional), initial encounter: Secondary | ICD-10-CM

## 2015-12-07 DIAGNOSIS — I10 Essential (primary) hypertension: Secondary | ICD-10-CM | POA: Diagnosis present

## 2015-12-07 DIAGNOSIS — R339 Retention of urine, unspecified: Secondary | ICD-10-CM | POA: Diagnosis present

## 2015-12-07 DIAGNOSIS — G8929 Other chronic pain: Secondary | ICD-10-CM | POA: Diagnosis present

## 2015-12-07 DIAGNOSIS — Z79899 Other long term (current) drug therapy: Secondary | ICD-10-CM | POA: Diagnosis not present

## 2015-12-07 LAB — COMPREHENSIVE METABOLIC PANEL
ALK PHOS: 78 U/L (ref 38–126)
ALT: 37 U/L (ref 14–54)
ANION GAP: 6 (ref 5–15)
AST: 271 U/L — ABNORMAL HIGH (ref 15–41)
Albumin: 3.8 g/dL (ref 3.5–5.0)
BUN: 17 mg/dL (ref 6–20)
CALCIUM: 9.1 mg/dL (ref 8.9–10.3)
CHLORIDE: 106 mmol/L (ref 101–111)
CO2: 26 mmol/L (ref 22–32)
CREATININE: 0.76 mg/dL (ref 0.44–1.00)
Glucose, Bld: 193 mg/dL — ABNORMAL HIGH (ref 65–99)
Potassium: 4.6 mmol/L (ref 3.5–5.1)
SODIUM: 138 mmol/L (ref 135–145)
Total Bilirubin: 0.4 mg/dL (ref 0.3–1.2)
Total Protein: 6.6 g/dL (ref 6.5–8.1)

## 2015-12-07 LAB — CBC
HCT: 39.7 % (ref 35.0–47.0)
Hemoglobin: 12.9 g/dL (ref 12.0–16.0)
MCH: 31.7 pg (ref 26.0–34.0)
MCHC: 32.6 g/dL (ref 32.0–36.0)
MCV: 97.3 fL (ref 80.0–100.0)
PLATELETS: 227 10*3/uL (ref 150–440)
RBC: 4.08 MIL/uL (ref 3.80–5.20)
RDW: 14.2 % (ref 11.5–14.5)
WBC: 7.5 10*3/uL (ref 3.6–11.0)

## 2015-12-07 LAB — URINALYSIS COMPLETE WITH MICROSCOPIC (ARMC ONLY)
BILIRUBIN URINE: NEGATIVE
Glucose, UA: 50 mg/dL — AB
KETONES UR: NEGATIVE mg/dL
Leukocytes, UA: NEGATIVE
NITRITE: NEGATIVE
PH: 5 (ref 5.0–8.0)
Protein, ur: 30 mg/dL — AB
SPECIFIC GRAVITY, URINE: 1.017 (ref 1.005–1.030)

## 2015-12-07 LAB — GLUCOSE, CAPILLARY
GLUCOSE-CAPILLARY: 117 mg/dL — AB (ref 65–99)
Glucose-Capillary: 152 mg/dL — ABNORMAL HIGH (ref 65–99)

## 2015-12-07 LAB — AMMONIA: AMMONIA: 26 umol/L (ref 9–35)

## 2015-12-07 LAB — CK: Total CK: 20062 U/L — ABNORMAL HIGH (ref 38–234)

## 2015-12-07 LAB — TROPONIN I

## 2015-12-07 LAB — LIPASE, BLOOD: LIPASE: 14 U/L (ref 11–51)

## 2015-12-07 MED ORDER — ENOXAPARIN SODIUM 40 MG/0.4ML ~~LOC~~ SOLN
40.0000 mg | SUBCUTANEOUS | Status: DC
  Administered 2015-12-07 – 2015-12-08 (×2): 40 mg via SUBCUTANEOUS
  Filled 2015-12-07 (×2): qty 0.4

## 2015-12-07 MED ORDER — SODIUM CHLORIDE 0.9 % IV BOLUS (SEPSIS)
1000.0000 mL | Freq: Once | INTRAVENOUS | Status: AC
  Administered 2015-12-07: 1000 mL via INTRAVENOUS

## 2015-12-07 MED ORDER — ONDANSETRON HCL 4 MG PO TABS: 4.0000 mg | ORAL_TABLET | Freq: Four times a day (QID) | ORAL | Status: DC | PRN

## 2015-12-07 MED ORDER — PANTOPRAZOLE SODIUM 40 MG PO TBEC
40.0000 mg | DELAYED_RELEASE_TABLET | Freq: Every day | ORAL | Status: DC
  Administered 2015-12-08 – 2015-12-09 (×2): 40 mg via ORAL
  Filled 2015-12-07 (×2): qty 1

## 2015-12-07 MED ORDER — ACETAMINOPHEN 650 MG RE SUPP: 650.0000 mg | Freq: Four times a day (QID) | RECTAL | Status: DC | PRN

## 2015-12-07 MED ORDER — SODIUM CHLORIDE 0.9 % IV SOLN
INTRAVENOUS | Status: DC
  Administered 2015-12-07 – 2015-12-09 (×4): via INTRAVENOUS

## 2015-12-07 MED ORDER — ONDANSETRON HCL 4 MG/2ML IJ SOLN: 4.0000 mg | Freq: Four times a day (QID) | INTRAMUSCULAR | Status: DC | PRN

## 2015-12-07 MED ORDER — MIRTAZAPINE 15 MG PO TABS
15.0000 mg | ORAL_TABLET | Freq: Every day | ORAL | Status: DC
Start: 2015-12-07 — End: 2015-12-09
  Administered 2015-12-07 – 2015-12-08 (×2): 15 mg via ORAL
  Filled 2015-12-07 (×2): qty 1

## 2015-12-07 MED ORDER — VITAMIN D (ERGOCALCIFEROL) 1.25 MG (50000 UNIT) PO CAPS
50000.0000 [IU] | ORAL_CAPSULE | ORAL | Status: DC
  Administered 2015-12-07: 23:00:00 50000 [IU] via ORAL
  Filled 2015-12-07: qty 1

## 2015-12-07 MED ORDER — TICAGRELOR 90 MG PO TABS
90.0000 mg | ORAL_TABLET | Freq: Two times a day (BID) | ORAL | Status: DC
  Administered 2015-12-07 – 2015-12-09 (×4): 90 mg via ORAL
  Filled 2015-12-07 (×4): qty 1

## 2015-12-07 MED ORDER — DIPHENOXYLATE-ATROPINE 2.5-0.025 MG PO TABS: 1.0000 | ORAL_TABLET | Freq: Four times a day (QID) | ORAL | Status: DC | PRN

## 2015-12-07 MED ORDER — INSULIN ASPART 100 UNIT/ML ~~LOC~~ SOLN
0.0000 [IU] | Freq: Three times a day (TID) | SUBCUTANEOUS | Status: DC
Start: 2015-12-08 — End: 2015-12-09
  Administered 2015-12-08 – 2015-12-09 (×2): 2 [IU] via SUBCUTANEOUS
  Filled 2015-12-07 (×2): qty 2

## 2015-12-07 MED ORDER — NALOXONE HCL 2 MG/2ML IJ SOSY
PREFILLED_SYRINGE | INTRAMUSCULAR | Status: AC
  Administered 2015-12-07: 0.4 mg via INTRAVENOUS
  Filled 2015-12-07: qty 2

## 2015-12-07 MED ORDER — FESOTERODINE FUMARATE ER 4 MG PO TB24
4.0000 mg | ORAL_TABLET | Freq: Every day | ORAL | Status: DC
  Administered 2015-12-08 – 2015-12-09 (×2): 4 mg via ORAL
  Filled 2015-12-07 (×3): qty 1

## 2015-12-07 MED ORDER — VENLAFAXINE HCL ER 37.5 MG PO CP24
150.0000 mg | ORAL_CAPSULE | Freq: Every day | ORAL | Status: DC
  Administered 2015-12-08 – 2015-12-09 (×2): 150 mg via ORAL
  Filled 2015-12-07 (×2): qty 4

## 2015-12-07 MED ORDER — PREGABALIN 75 MG PO CAPS
75.0000 mg | ORAL_CAPSULE | Freq: Two times a day (BID) | ORAL | Status: DC
  Administered 2015-12-07 – 2015-12-09 (×4): 75 mg via ORAL
  Filled 2015-12-07 (×4): qty 1

## 2015-12-07 MED ORDER — FERROUS SULFATE 325 (65 FE) MG PO TABS
325.0000 mg | ORAL_TABLET | Freq: Every day | ORAL | Status: DC
  Administered 2015-12-08 – 2015-12-09 (×2): 325 mg via ORAL
  Filled 2015-12-07 (×2): qty 1

## 2015-12-07 MED ORDER — ACETAMINOPHEN 325 MG PO TABS
650.0000 mg | ORAL_TABLET | Freq: Four times a day (QID) | ORAL | Status: DC | PRN
  Administered 2015-12-08 – 2015-12-09 (×4): 650 mg via ORAL
  Filled 2015-12-07 (×4): qty 2

## 2015-12-07 MED ORDER — SODIUM CHLORIDE 0.9 % IJ SOLN
3.0000 mL | Freq: Two times a day (BID) | INTRAMUSCULAR | Status: DC
  Administered 2015-12-07 – 2015-12-09 (×4): 3 mL via INTRAVENOUS

## 2015-12-07 MED ORDER — DARIFENACIN HYDROBROMIDE ER 7.5 MG PO TB24
7.5000 mg | ORAL_TABLET | Freq: Every day | ORAL | Status: DC
  Administered 2015-12-08 – 2015-12-09 (×2): 7.5 mg via ORAL
  Filled 2015-12-07 (×2): qty 1

## 2015-12-07 MED ORDER — LIOTHYRONINE SODIUM 25 MCG PO TABS
25.0000 ug | ORAL_TABLET | Freq: Every day | ORAL | Status: DC
  Administered 2015-12-08 – 2015-12-09 (×2): 25 ug via ORAL
  Filled 2015-12-07 (×3): qty 1

## 2015-12-07 MED ORDER — NALOXONE HCL 2 MG/2ML IJ SOSY
0.4000 mg | PREFILLED_SYRINGE | Freq: Once | INTRAMUSCULAR | Status: AC
Start: 2015-12-07 — End: 2015-12-07
  Administered 2015-12-07: 0.4 mg via INTRAVENOUS

## 2015-12-07 MED ORDER — ASPIRIN EC 81 MG PO TBEC
81.0000 mg | DELAYED_RELEASE_TABLET | Freq: Every day | ORAL | Status: DC
  Administered 2015-12-08 – 2015-12-09 (×2): 81 mg via ORAL
  Filled 2015-12-07 (×2): qty 1

## 2015-12-07 MED ORDER — TRAZODONE HCL 100 MG PO TABS
100.0000 mg | ORAL_TABLET | Freq: Every evening | ORAL | Status: DC | PRN
  Administered 2015-12-08: 21:00:00 100 mg via ORAL
  Filled 2015-12-07: qty 1

## 2015-12-07 NOTE — ED Provider Notes (Signed)
[facility] Emergency Department Provider Note REMINDER - THIS NOTE IS NOT A FINAL MEDICAL RECORD UNTIL IT IS SIGNED. UNTIL THEN, THE CONTENT BELOW MAY REFLECT INFORMATION FROM A DOCUMENTATION TEMPLATE, NOT THE ACTUAL PATIENT VISIT. ____________________________________________  Time seen: Approximately 6:19 PM  I have reviewed the triage vital signs and the nursing notes.   HISTORY  Chief Complaint Altered Mental Status  EM caveat: The patient confused, somnolent, unable to provide history  HPI Jessica Donovan is a 69 y.o. female the previous history of heart attack, stroke, coronary disease.  Patient was also recently admitted the hospital for renal failure concerns for possible drug over dose, unintentional/ polypharmacy.  Patient presents today with EMS, evidently she is been down on the ground for about 8 hours. Not a whole lot else of history is available.   Past Medical History  Diagnosis Date  . Hypertension   . Diabetes mellitus without complication (HCC)   . Coronary artery disease   . Low back pain   . Stroke (HCC)   . Myocardial infarction (HCC)   . Chronic back pain     Patient Active Problem List   Diagnosis Date Noted  . Rhabdomyolysis 12/07/2015  . ARF (acute renal failure) (HCC) 11/29/2015  . Hypotension 11/29/2015  . 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 Angina effort (HCC) 11/07/2015  . Osteomyelitis (HCC) 11/07/2015    Past Surgical History  Procedure Laterality Date  . Toe amputation    . 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 Cardiac catheterization N/A 11/12/2015    Procedure: Left Heart Cath and Coronary Angiography;  Surgeon: Marcina Millard, MD;  Location: [facility] INVASIVE CV LAB;  Service: Cardiovascular;  Laterality: N/A;    No current outpatient prescriptions on file.  Allergies Codeine;  Influenza vaccines; Methadone; Percocet; Tetanus toxoids; and Tetracyclines & related  Family History  Problem Relation Age of Onset  . CAD Mother   . CAD Father     Social History Social History  Substance Use Topics  . Smoking status: Never Smoker   . Smokeless tobacco: None  . Alcohol Use: No    Review of Systems EM caveat ____________________________________________   PHYSICAL EXAM:  VITAL SIGNS: ED Triage Vitals  Enc Vitals Group     BP --      Pulse --      Resp --      Temp --      Temp src --      SpO2 --      Weight --      Height --      Head Cir --      Peak Flow --      Pain Score --      Pain Loc --      Pain Edu? --      Excl. in GC? --    Constitutional: Patient lethargic, will raise her head to voice but quickly falls back asleep. We'll initiate one to 2 word sentences and falls asleep. Eyes: Conjunctivae are normal. PERRL. EOMI. Head: Atraumatic. Nose: No congestion/rhinnorhea. Mouth/Throat: Mucous membranes are quite dry.  Oropharynx non-erythematous. Neck: No stridor.   Cardiovascular: Normal rate, regular rhythm. Grossly normal heart sounds.  Good peripheral circulation. Respiratory: Bradypnea. Shallow respirations, approximately 70/m. Gastrointestinal: Soft and nontender. No distention. No abdominal bruits. No CVA tenderness. Musculoskeletal: No lower extremity tenderness nor edema.  No joint effusions. Evidence of previous toe amputation. Neurologic:  Slightly slurred speech, moves and withdraws all extremities to touch. Follows basic commands only for one or 2 seconds before falling fast asleep. Skin:  Skin is warm, dry and intact. No rash noted. Psychiatric: Mood and affect are normal. Speech and behavior are normal.  ____________________________________________   LABS (all labs ordered are listed, but only abnormal results are displayed)  Labs Reviewed  COMPREHENSIVE METABOLIC PANEL - Abnormal; Notable for the following:    Glucose,  Bld 193 (*)    AST 271 (*)    All other components within normal limits  CK - Abnormal; Notable for the following:    Total CK 20062 (*)    All other components within normal limits  URINALYSIS COMPLETEWITH MICROSCOPIC ([facility] ONLY) - Abnormal; Notable for the following:    Color, Urine YELLOW (*)    APPearance CLEAR (*)    Glucose, UA 50 (*)    Hgb urine dipstick 3+ (*)    Protein, ur 30 (*)    Bacteria, UA RARE (*)    Squamous Epithelial / LPF 0-5 (*)    All other components within normal limits  GLUCOSE, CAPILLARY - Abnormal; Notable for the following:    Glucose-Capillary 152 (*)    All other components within normal limits  GLUCOSE, CAPILLARY - Abnormal; Notable for the following:    Glucose-Capillary 117 (*)    All other components within normal limits  CBC  LIPASE, BLOOD  AMMONIA  TROPONIN I  CK  CBC  BASIC METABOLIC PANEL  TSH   ____________________________________________  EKG  Reviewed and interpreted by me at 1820 Sinus rhythm Heart rate 70 Q wave inferior QRS 95 QTc 450 PR 200 Viewed and interpreted as normal sinus rhythm, probable old inferior and lateral MI. No evidence of acute ST elevation ____________________________________________  RADIOLOGY  DG Chest Port 1 View (Final result) Result time: 12/07/15 18:52:26   Final result by Rad Results In Interface (12/07/15 18:52:26)   Narrative:   CLINICAL DATA: Somnolence. Found on the floor. Lethargy.  EXAM: PORTABLE CHEST 1 VIEW  COMPARISON: 11/29/2015  FINDINGS: Low lung volumes persist, however improved lung volumes from prior exam. Minimal residual left greater than right basilar atelectasis. Cardiomediastinal contours are normal for technique. No confluent airspace disease, large pleural effusion or pneumothorax. No pulmonary edema. Degenerative change in both shoulders.  IMPRESSION: Hypoventilatory chest, however improvement from prior exam. Mild residual bibasilar atelectasis.      ____________________________________________   PROCEDURES  Procedure(s) performed: None  Critical Care performed: No  ____________________________________________   INITIAL IMPRESSION / ASSESSMENT AND PLAN / ED COURSE  Pertinent labs & imaging results that were available during my care of the patient were reviewed by me and considered in my medical decision making (see chart for details).    ----------------------------------------- 8:12 PM on 12/07/2015 -----------------------------------------  After receiving naloxone, patient's respiratory rate normalized and she began speaking coherently. She is able to give me a history of having been up this morning, got out of the shower took her medications, went to sit or table, and shortly thereafter got lightheaded. She states that she had to go to the floor, and since that time she is unable to get off the floor for the last 8-12 hours. She did not hit her head or injure her neck. She reports she feels well at this time, repeat exam she moves all extremities well, no facial droop, fully awake and alert. I do not see  any indication for need for a CAT scan of the head at this time.  ----------------------------------------- 8:13 PM on 12/07/2015 -----------------------------------------  Hospital. Rhabdomyolysis. ____________________________________________   FINAL CLINICAL IMPRESSION(S) / ED DIAGNOSES  Final diagnoses:  Non-traumatic rhabdomyolysis  Drug overdose, accidental or unintentional, initial encounter      Sharyn CreamerMark Quale, MD 12/08/15 0005

## 2015-12-07 NOTE — ED Notes (Signed)
Narcan given per MD order, pt more awake

## 2015-12-07 NOTE — H&P (Addendum)
[facility] - [place] at [facility]   PATIENT NAME: Jessica Donovan    MR#:  [id number]  DATE OF BIRTH:  [birth year]  DATE OF ADMISSION:  12/07/2015  PRIMARY CARE PHYSICIAN: Geoffery Lyons., PA   REQUESTING/REFERRING PHYSICIAN: Dr. Fanny Bien  CHIEF COMPLAINT:   Chief Complaint  Patient presents with  . Altered Mental Status    HISTORY OF PRESENT ILLNESS: Jessica Donovan  is a 69 y.o. female with a known history of  hypertension, diabetes, coronary artery disease, chronic low back pain, history of stroke who was actually in the hospital with acute renal failure and hypotension recently and was discharged to a skilled nursing facility. Patient was discharged back home on Wednesday. And was living at home by herself. She was found to be on the floor today. When she arrived to the ED she received Narcan and she started waking up. During her previous admission medications were thought to be the cause of her poor responsiveness and hypotension. Patient currently is drowsy and unable to provide me with any review of systems.      PAST MEDICAL HISTORY:   Past Medical History  Diagnosis Date  . Hypertension   . Diabetes mellitus without complication (HCC)   . Coronary artery disease   . Low back pain   . Stroke (HCC)   . Myocardial infarction (HCC)   . Chronic back pain     PAST SURGICAL HISTORY:  Past Surgical History  Procedure Laterality Date  . Toe amputation    . 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 Cardiac catheterization N/A 11/12/2015    Procedure: Left Heart Cath and Coronary Angiography;  Surgeon: Marcina Millard, MD;  Location: [facility] INVASIVE CV LAB;  Service: Cardiovascular;  Laterality: N/A;    SOCIAL HISTORY:  Social History  Substance Use Topics  . Smoking status: Never Smoker   . Smokeless tobacco: Not on file  . Alcohol Use:  Not on file    FAMILY HISTORY:  Family History  Problem Relation Age of Onset  . CAD Mother   . CAD Father     DRUG ALLERGIES:  Allergies  Allergen Reactions  . Codeine Nausea And Vomiting  . Influenza Vaccines Other (See Comments)    Passed out  . Methadone Hives  . Percocet [Oxycodone-Acetaminophen] Nausea And Vomiting  . Tetanus Toxoids Swelling  . Tetracyclines & Related Rash    REVIEW OF SYSTEMS:   Due to patient being drowsy unable to provide any review of systems.   MEDICATIONS AT HOME:  Prior to Admission medications   Medication Sig Start Date End Date Taking? Authorizing Provider  ALPRAZolam Prudy Feeler) 1 MG tablet Take 1 tablet (1 mg total) by mouth 2 (two) times daily as needed for anxiety. 11/12/15   Milagros Loll, MD  aspirin EC 81 MG tablet Take 81 mg by mouth daily.    Historical Provider, MD  atorvastatin (LIPITOR) 20 MG tablet Take 20 mg by mouth daily.    Historical Provider, MD  diphenoxylate-atropine (LOMOTIL) 2.5-0.025 MG tablet Take 1 tablet by mouth 4 (four) times daily as needed for diarrhea or loose stools.    Historical Provider, MD  ergocalciferol (VITAMIN D2) 50000 UNITS capsule Take 50,000 Units by mouth once a week. 11/10/15   Historical Provider, MD  ferrous sulfate 325 (65 FE) MG tablet Take 325 mg by mouth daily with breakfast.  Historical Provider, MD  gabapentin (NEURONTIN) 100 MG capsule Take 100 mg by mouth.    Historical Provider, MD  insulin glargine (LANTUS) 100 UNIT/ML injection Inject 0.25 mLs (25 Units total) into the skin at bedtime. 11/12/15   Milagros LollSrikar Sudini, MD  isosorbide mononitrate (IMDUR) 30 MG 24 hr tablet Take 30 mg by mouth daily.    Historical Provider, MD  liothyronine (CYTOMEL) 25 MCG tablet Take 25 mcg by mouth daily.    Historical Provider, MD  mirtazapine (REMERON) 15 MG tablet Take 15 mg by mouth at bedtime.    Historical Provider, MD  morphine (MS CONTIN) 30 MG 12 hr tablet Take 1 tablet (30 mg total) by mouth 3  (three) times daily. 11/12/15   Milagros LollSrikar Sudini, MD  morphine (MSIR) 15 MG tablet Take 1 tablet (15 mg total) by mouth every 6 (six) hours as needed for severe pain. 11/12/15   Milagros LollSrikar Sudini, MD  nitroGLYCERIN (NITROSTAT) 0.4 MG SL tablet Place 0.4 mg under the tongue every 5 (five) minutes as needed for chest pain.    Historical Provider, MD  omeprazole (PRILOSEC) 20 MG capsule Take 20 mg by mouth daily.    Historical Provider, MD  pregabalin (LYRICA) 75 MG capsule Take 1 capsule (75 mg total) by mouth 2 (two) times daily. 11/12/15   Houston SirenVivek J Sainani, MD  solifenacin (VESICARE) 10 MG tablet Take by mouth daily.    Historical Provider, MD  ticagrelor (BRILINTA) 90 MG TABS tablet Take 90 mg by mouth 2 (two) times daily.    Historical Provider, MD  tolterodine (DETROL LA) 4 MG 24 hr capsule Take 4 mg by mouth daily.    Historical Provider, MD  traZODone (DESYREL) 100 MG tablet Take 100 mg by mouth at bedtime as needed for sleep.    Historical Provider, MD  venlafaxine XR (EFFEXOR-XR) 150 MG 24 hr capsule Take 150 mg by mouth daily with breakfast.    Historical Provider, MD      PHYSICAL EXAMINATION:   VITAL SIGNS: Blood pressure 145/63, pulse 73, temperature 97.5 F (36.4 C), temperature source Oral, resp. rate 17, height 5\' 3"  (1.6 m), weight 68.04 kg (150 lb), SpO2 96 %.  GENERAL:  69 y.o.-year-old patient lying in the bed with no acute distress. Drowsy opens eyes but not able to keep them open EYES: Pupils equal, round, reactive to light and accommodation. No scleral icterus. Extraocular muscles intact.  HEENT: Head atraumatic, normocephalic. Oropharynx and nasopharynx clear.  NECK:  Supple, no jugular venous distention. No thyroid enlargement, no tenderness.  LUNGS: Normal breath sounds bilaterally, no wheezing, rales,rhonchi or crepitation. No use of accessory muscles of respiration.  CARDIOVASCULAR: S1, S2 normal. No murmurs, rubs, or gallops.  ABDOMEN: Soft, nontender, nondistended. Bowel  sounds present. No organomegaly or mass.  EXTREMITIES: No pedal edema, cyanosis, or clubbing.  NEUROLOGIC: Drowsy  PSYCHIATRIC: Drowsy SKIN: No obvious rash, lesion, or ulcer.   LABORATORY PANEL:   CBC  Recent Labs Lab 12/07/15 1820  WBC 7.5  HGB 12.9  HCT 39.7  PLT 227  MCV 97.3  MCH 31.7  MCHC 32.6  RDW 14.2   ------------------------------------------------------------------------------------------------------------------  Chemistries   Recent Labs Lab 12/01/15 0532 12/07/15 1820  NA 143 138  K 4.0 4.6  CL 114* 106  CO2 24 26  GLUCOSE 89 193*  BUN 14 17  CREATININE 0.64 0.76  CALCIUM 8.6* 9.1  AST  --  271*  ALT  --  37  ALKPHOS  --  78  BILITOT  --  0.4   ------------------------------------------------------------------------------------------------------------------ estimated creatinine clearance is 61.4 mL/min (by C-G formula based on Cr of 0.76). ------------------------------------------------------------------------------------------------------------------ No results for input(s): TSH, T4TOTAL, T3FREE, THYROIDAB in the last 72 hours.  Invalid input(s): FREET3   Coagulation profile No results for input(s): INR, PROTIME in the last 168 hours. ------------------------------------------------------------------------------------------------------------------- No results for input(s): DDIMER in the last 72 hours. -------------------------------------------------------------------------------------------------------------------  Cardiac Enzymes  Recent Labs Lab 12/07/15 1820  TROPONINI <0.03   ------------------------------------------------------------------------------------------------------------------ Invalid input(s): POCBNP  ---------------------------------------------------------------------------------------------------------------  Urinalysis    Component Value Date/Time   COLORURINE YELLOW* 12/07/2015 1820   COLORURINE Yellow  12/15/2014 0958   APPEARANCEUR CLEAR* 12/07/2015 1820   APPEARANCEUR Hazy 12/15/2014 0958   LABSPEC 1.017 12/07/2015 1820   LABSPEC 1.014 12/15/2014 0958   PHURINE 5.0 12/07/2015 1820   PHURINE 5.0 12/15/2014 0958   GLUCOSEU 50* 12/07/2015 1820   GLUCOSEU Negative 12/15/2014 0958   HGBUR 3+* 12/07/2015 1820   HGBUR Negative 12/15/2014 0958   BILIRUBINUR NEGATIVE 12/07/2015 1820   BILIRUBINUR Negative 12/15/2014 0958   KETONESUR NEGATIVE 12/07/2015 1820   KETONESUR Negative 12/15/2014 0958   PROTEINUR 30* 12/07/2015 1820   PROTEINUR Negative 12/15/2014 0958   NITRITE NEGATIVE 12/07/2015 1820   NITRITE Negative 12/15/2014 0958   LEUKOCYTESUR NEGATIVE 12/07/2015 1820   LEUKOCYTESUR 1+ 12/15/2014 0958     RADIOLOGY: Dg Chest Port 1 View  12/07/2015  CLINICAL DATA:  Somnolence.  Found on the floor.  Lethargy. EXAM: PORTABLE CHEST 1 VIEW COMPARISON:  11/29/2015 FINDINGS: Low lung volumes persist, however improved lung volumes from prior exam. Minimal residual left greater than right basilar atelectasis. Cardiomediastinal contours are normal for technique. No confluent airspace disease, large pleural effusion or pneumothorax. No pulmonary edema. Degenerative change in both shoulders. IMPRESSION: Hypoventilatory chest, however improvement from prior exam. Mild residual bibasilar atelectasis. Electronically Signed   By: Rubye Oaks M.D.   On: 12/07/2015 18:52    EKG: Orders placed or performed during the hospital encounter of 12/07/15  . ED EKG  . ED EKG    IMPRESSION AND PLAN: Patient is a 69 year old white female recently hospitalized with hypotension and urinary retention and acute renal failure was found on the floor. Noted to have rhabdomyolysis.  1. Unresponsiveness- this is likely again related to medications. Improved with Narcan. At this time I will hold all sedating medications.  2. Rhabdomyolysis due to her being on the floor. We'll give her IV fluids follow CPK in the  morning: Cholesterol medications  3. Hypertension I'll continue isosorbide mononitrate.  4. Coronary artery disease continue aspirin and present time  5.  Depression continue Effexor  6. DMII ssi, hold insulin   7. Miscellaneous we will give Lovenox for DVT prophylaxis    All the records are reviewed and case discussed with ED provider. Management plans discussed with the patient, family and they are in agreement.  CODE STATUS: Advance Directive Documentation        Most Recent Value   Type of Advance Directive  Living will   Pre-existing out of facility DNR order (yellow form or pink MOST form)     "MOST" Form in Place?         TOTAL TIME TAKING CARE OF THIS PATIENT: 55 minutes.    Auburn Bilberry M.D on 12/07/2015 at 9:06 PM  Between 7am to 6pm - Pager - [phone]  After 6pm go to [email or web address] - password [email or web address] [facility]  [place] [facility]  Office  [phone]  CC: Primary care  physician; Geoffery Lyons., PA

## 2015-12-07 NOTE — ED Notes (Signed)
Pt was found on floor at apartment by nurse, pt is lethargic but answers questions, pt continues to sleep on and off during triage, pt has taken multiple pain medications today is is unaware how much she took today

## 2015-12-07 NOTE — ED Notes (Signed)
Pt unable to tolerate pelvic exam due to pain, orders obtained and intiated

## 2015-12-08 LAB — GLUCOSE, CAPILLARY
GLUCOSE-CAPILLARY: 104 mg/dL — AB (ref 65–99)
Glucose-Capillary: 87 mg/dL (ref 65–99)
Glucose-Capillary: 171 mg/dL — ABNORMAL HIGH (ref 65–99)
Glucose-Capillary: 141 mg/dL — ABNORMAL HIGH (ref 65–99)

## 2015-12-08 LAB — CBC
HCT: 36.3 % (ref 35.0–47.0)
HEMOGLOBIN: 11.8 g/dL — AB (ref 12.0–16.0)
MCH: 31.1 pg (ref 26.0–34.0)
MCHC: 32.4 g/dL (ref 32.0–36.0)
MCV: 95.9 fL (ref 80.0–100.0)
PLATELETS: 228 10*3/uL (ref 150–440)
RBC: 3.79 MIL/uL — AB (ref 3.80–5.20)
RDW: 13.7 % (ref 11.5–14.5)
WBC: 6.2 10*3/uL (ref 3.6–11.0)

## 2015-12-08 LAB — TSH: TSH: 0.705 u[IU]/mL (ref 0.350–4.500)

## 2015-12-08 LAB — CK: Total CK: 13118 U/L — ABNORMAL HIGH (ref 38–234)

## 2015-12-08 LAB — BASIC METABOLIC PANEL
Anion gap: 5 (ref 5–15)
BUN: 12 mg/dL (ref 6–20)
CHLORIDE: 109 mmol/L (ref 101–111)
CO2: 26 mmol/L (ref 22–32)
CREATININE: 0.59 mg/dL (ref 0.44–1.00)
Calcium: 8.6 mg/dL — ABNORMAL LOW (ref 8.9–10.3)
GFR calc Af Amer: >60 mL/min (ref >60)
GFR calc non Af Amer: >60 mL/min (ref >60)
GLUCOSE: 104 mg/dL — AB (ref 65–99)
POTASSIUM: 3.8 mmol/L (ref 3.5–5.1)
Sodium: 140 mmol/L (ref 135–145)

## 2015-12-08 MED ORDER — GABAPENTIN 100 MG PO CAPS
100.0000 mg | ORAL_CAPSULE | Freq: Every day | ORAL | Status: DC
  Administered 2015-12-08: 21:00:00 100 mg via ORAL
  Filled 2015-12-08: qty 1

## 2015-12-08 MED ORDER — ISOSORBIDE MONONITRATE ER 30 MG PO TB24
30.0000 mg | ORAL_TABLET | Freq: Every day | ORAL | Status: DC
  Administered 2015-12-08 – 2015-12-09 (×2): 30 mg via ORAL
  Filled 2015-12-08 (×2): qty 1

## 2015-12-08 MED ORDER — MORPHINE SULFATE ER 30 MG PO TBCR
30.0000 mg | EXTENDED_RELEASE_TABLET | Freq: Three times a day (TID) | ORAL | Status: DC
  Administered 2015-12-08 – 2015-12-09 (×3): 30 mg via ORAL
  Filled 2015-12-08 (×3): qty 1

## 2015-12-08 MED ORDER — ATORVASTATIN CALCIUM 20 MG PO TABS
20.0000 mg | ORAL_TABLET | Freq: Every day | ORAL | Status: DC
  Administered 2015-12-08: 20 mg via ORAL
  Filled 2015-12-08: qty 1

## 2015-12-08 MED ORDER — INSULIN GLARGINE 100 UNIT/ML ~~LOC~~ SOLN
25.0000 [IU] | Freq: Every day | SUBCUTANEOUS | Status: DC
  Administered 2015-12-08: 21:00:00 25 [IU] via SUBCUTANEOUS
  Filled 2015-12-08 (×2): qty 0.25

## 2015-12-08 MED ORDER — MORPHINE SULFATE 15 MG PO TABS
15.0000 mg | ORAL_TABLET | Freq: Four times a day (QID) | ORAL | Status: DC | PRN
  Administered 2015-12-08 – 2015-12-09 (×3): 15 mg via ORAL
  Filled 2015-12-08 (×3): qty 1

## 2015-12-08 NOTE — Clinical Social Work Note (Signed)
Clinical Social Work Assessment  Patient Details  Name: Jessica Donovan MRN: [id number] Date of Birth: 1946-08-22  Date of referral:  12/08/15               Reason for consult:  Rule-out Psychosocial                Permission sought to share information with:    Permission granted to share information::     Name::        Agency::     Relationship::     Contact Information:     Housing/Transportation Living arrangements for the past 2 months:  Skilled Nursing Facility, Single Family Home Source of Information:  Patient, Medical Team Patient Interpreter Needed:  None Criminal Activity/Legal Involvement Pertinent to Current Situation/Hospitalization:    Significant Relationships:  Friend Lives with:  Self Do you feel safe going back to the place where you live?  Yes Need for family participation in patient care:  No (Coment)  Care giving concerns:  None at this time   Office managerocial Worker assessment / plan:  CSW consult to assess for discharge needs.  Patient is very pleasant, alert oriented and engaged in conversation with CSW.  Patient lives alone, her support system and POA is her friend Jessica Donovan who was at bedside.  Patient ok for CSW to talk with her in the room.  Patient was recently [facility] to SNF[facility] and had home health services in place.  Per patient, she fell when she tried to get up from her desk.     CSW will follow patient for discharge needs in the event she needs to return to SNF.  If patient discharges home Care Management will resume home health services.    Employment status:  Retired Health and safety inspectornsurance information:  Medicare PT Recommendations:  Not assessed at this time Information / Referral to community resources:   (none at this time)  Patient/Family's Response to care:  Patient was appreciative of talking with CSW.  Patient/Family's Understanding of and Emotional Response to Diagnosis, Current Treatment, and Prognosis:  Patient understands that she is under continued  medical work up at this time.  Discharge disposition will be determined once she is medically stable.  Emotional Assessment Appearance:  Appears stated age Attitude/Demeanor/Rapport:  Lethargic Affect (typically observed):  Accepting, Adaptable, Pleasant, Calm Orientation:  Oriented to Self, Oriented to Place, Oriented to  Time, Oriented to Situation Alcohol / Substance use:  Never Used Psych involvement (Current and /or in the community):  No (Comment)  Discharge Needs  Concerns to be addressed:  Care Coordination, Discharge Planning Concerns Readmission within the last 30 days:  Yes Current discharge risk:  Chronically ill, Dependent with Mobility, Lives alone, Lack of support system Barriers to Discharge:  Continued Medical Work up, No Barriers Identified   Soundra PilonMoore, Dacari Beckstrand H, LCSW 12/08/2015, 11:27 AM

## 2015-12-08 NOTE — Progress Notes (Signed)
Physical Therapy Evaluation Patient Details Name: Jessica Donovan MRN: [id number] DOB: 04-Aug-1946 Today's Date: 12/08/2015   History of Present Illness  Jessica DanceBarbara Jansma is a 69 y.o. female with a known history of hypertension, diabetes, coronary artery disease, chronic low back pain, history of stroke who was actually in the hospital with acute renal failure and hypotension recently and was discharged to a skilled nursing facility. Patient was discharged back home on Wednesday. And was living at home by herself. She was found to be on the floor today. When she arrived to the ED she received Narcan and she started waking up.  Clinical Impression  Pt presents with pain in B LE's limiting pt's functional mobility and ambulation.  Pt would benefit from acute PT services to address objective findings.  Pt did report that pain with standing has decreased since yesterday and she was able to walk in the room with RW with good balance.  Expect pt to continue to improve with mobility and be able to go home with HHPT.    Follow Up Recommendations Home health PT    Equipment Recommendations  Rolling walker with 5" wheels    Recommendations for Other Services       Precautions / Restrictions Precautions Precautions: Fall Restrictions Weight Bearing Restrictions: No      Mobility  Bed Mobility Overal bed mobility: Modified Independent             General bed mobility comments: hand rails and HOB elevated to complete transfer; extra time to move legs.  Transfers Overall transfer level: Needs assistance Equipment used: Rolling walker (2 wheeled) Transfers: Sit to/from Stand Sit to Stand: Supervision         General transfer comment: verbal cues to put one hand on bed; slow to rise  Ambulation/Gait Ambulation/Gait assistance: Supervision Ambulation Distance (Feet): 20 Feet Assistive device: Rolling walker (2 wheeled)   Gait velocity: reduced   General Gait Details: Decreased  cadence with step through gait pattern; overall guarded due to pain in legs.  Stairs            Wheelchair Mobility    Modified Rankin (Stroke Patients Only)       Balance Overall balance assessment: Modified Independent Sitting-balance support: Feet supported   Sitting balance - Comments: No balance deficits in sitting.    Standing balance support: Bilateral upper extremity supported Standing balance-Leahy Scale: Good Standing balance comment: Able to negotiate transfers and standing at sink in bathroom without LOB during functional activity.                             Pertinent Vitals/Pain Pain Assessment: 0-10 Pain Location: upper bilateral thighs and buttocks Pain Descriptors / Indicators: Burning;Discomfort Pain Intervention(s): Limited activity within patient's tolerance;Monitored during session    Home Living Family/patient expects to be discharged to:: Private residence Living Arrangements: Alone Available Help at Discharge: Home health Type of Home: Apartment Home Access: Ramped entrance     Home Layout: One level Home Equipment: Cane - single point;Walker - 4 wheels      Prior Function Level of Independence: Independent with assistive device(s)         Comments: Recently at SNF after toe amp then home alone with aide onece a week assisting with medication organization.     Hand Dominance        Extremity/Trunk Assessment   Upper Extremity Assessment: Overall WFL for tasks assessed  Lower Extremity Assessment: Generalized weakness         Communication   Communication: No difficulties  Cognition Arousal/Alertness: Awake/alert Behavior During Therapy: WFL for tasks assessed/performed;Flat affect Overall Cognitive Status: Within Functional Limits for tasks assessed                      General Comments      Exercises        Assessment/Plan    PT Assessment Patient needs continued PT services   PT Diagnosis Difficulty walking;Generalized weakness   PT Problem List Decreased strength;Decreased activity tolerance;Decreased mobility;Pain  PT Treatment Interventions DME instruction;Gait training;Functional mobility training;Therapeutic activities;Therapeutic exercise;Balance training   PT Goals (Current goals can be found in the Care Plan section) Acute Rehab PT Goals Patient Stated Goal: To return home.  PT Goal Formulation: With patient Time For Goal Achievement: 12/15/15 Potential to Achieve Goals: Good    Frequency Min 2X/week   Barriers to discharge Decreased caregiver support      Co-evaluation               End of Session Equipment Utilized During Treatment: Gait belt Activity Tolerance: Patient tolerated treatment well Patient left: in bed;with call bell/phone within reach;with bed alarm set Nurse Communication: Mobility status         Time: 7846-9629 PT Time Calculation (min) (ACUTE ONLY): 30 min   Charges:   PT Evaluation $Initial PT Evaluation Tier I: 1 Procedure PT Treatments $Therapeutic Activity: 8-22 mins   PT G Codes:        Sherece Gambrill A Amelie Caracci 07-Jan-2016, 2:42 PM

## 2015-12-08 NOTE — Progress Notes (Signed)
[facility] - [place] at [facility]   PATIENT NAME: Jessica Donovan    MR#:  [id number]  DATE OF BIRTH:  [birth year]  SUBJECTIVE:   Came in after patient had passing out spell at home. Home down time not known. Patient remained on the floor about 35-40 minutes. She was awake according to her all the time. Felt very weak and does not remembers to what caused her to drift down on the floor. She was brought into the emergency room found to have acute rhabdomyolysis. Feels better today awake alert oriented 3. She is requesting her chronic pain medication which is MS Contin and morphine immediate release. REVIEW OF SYSTEMS:   Review of Systems  Constitutional: Negative for fever, chills and weight loss.  HENT: Negative for ear discharge, ear pain and nosebleeds.   Eyes: Negative for blurred vision, pain and discharge.  Respiratory: Negative for sputum production, shortness of breath, wheezing and stridor.   Cardiovascular: Negative for chest pain, palpitations, orthopnea and PND.  Gastrointestinal: Negative for nausea, vomiting, abdominal pain and diarrhea.  Genitourinary: Negative for urgency and frequency.  Musculoskeletal: Positive for myalgias and back pain. Negative for joint pain.  Neurological: Positive for weakness. Negative for sensory change, speech change and focal weakness.  Psychiatric/Behavioral: Negative for depression and hallucinations. The patient is not nervous/anxious.   All other systems reviewed and are negative.  Tolerating Diet: Yes Tolerating PT: Pending  DRUG ALLERGIES:   Allergies  Allergen Reactions  . Codeine Nausea And Vomiting  . Influenza Vaccines Other (See Comments)    Passed out  . Methadone Hives  . Percocet [Oxycodone-Acetaminophen] Nausea And Vomiting  . Tetanus Toxoids Swelling  . Tetracyclines & Related Rash    VITALS:  Blood pressure 139/54, pulse 71, temperature 98.5 F (36.9 C), temperature source Oral, resp. rate  17, height  (1.6 m), weight 68.04 kg (150 lb), SpO2 99 %.  PHYSICAL EXAMINATION:   Physical Exam  GENERAL:  69 y.o.-year-old patient lying in the bed with no acute distress. Morbid obesity EYES: Pupils equal, round, reactive to light and accommodation. No scleral icterus. Extraocular muscles intact.  HEENT: Head atraumatic, normocephalic. Oropharynx and nasopharynx clear.  NECK:  Supple, no jugular venous distention. No thyroid enlargement, no tenderness.  LUNGS: Normal breath sounds bilaterally, no wheezing, rales, rhonchi. No use of accessory muscles of respiration.  CARDIOVASCULAR: S1, S2 normal. No murmurs, rubs, or gallops.  ABDOMEN: Soft, nontender, nondistended. Bowel sounds present. No organomegaly or mass.  EXTREMITIES: No cyanosis, clubbing or edema b/l.    NEUROLOGIC: Cranial nerves II through XII are intact. No focal Motor or sensory deficits b/l.   PSYCHIATRIC: The patient is alert and oriented x 3.  SKIN: No obvious rash, lesion, or ulcer.    LABORATORY PANEL:   CBC  Recent Labs Lab 12/08/15 0514  WBC 6.2  HGB 11.8*  HCT 36.3  PLT 228    Chemistries   Recent Labs Lab 12/07/15 1820 12/08/15 0514  NA 138 140  K 4.6 3.8  CL 106 109  CO2 26 26  GLUCOSE 193* 104*  BUN 17 12  CREATININE 0.76 0.59  CALCIUM 9.1 8.6*  AST 271*  --   ALT 37  --   ALKPHOS 78  --   BILITOT 0.4  --     Cardiac Enzymes  Recent Labs Lab 12/07/15 1820  TROPONINI <0.03    RADIOLOGY:  Dg Chest Port 1 View  12/07/2015  CLINICAL DATA:  Somnolence.  Found on the floor.  Lethargy. EXAM: PORTABLE CHEST 1 VIEW COMPARISON:  11/29/2015 FINDINGS: Low lung volumes persist, however improved lung volumes from prior exam. Minimal residual left greater than right basilar atelectasis. Cardiomediastinal contours are normal for technique. No confluent airspace disease, large pleural effusion or pneumothorax. No pulmonary edema. Degenerative change in both shoulders. IMPRESSION:  Hypoventilatory chest, however improvement from prior exam. Mild residual bibasilar atelectasis. Electronically Signed   By: Rubye OaksMelanie  Ehinger M.D.   On: 12/07/2015 18:52     ASSESSMENT AND PLAN:   Patient is a 69 year old white female recently hospitalized with hypotension and urinary retention and acute renal failure was found on the floor. Noted to have rhabdomyolysis.  1. generalized weakness with passing out spell transient  this is likely again related to medications. Improved with Narcan. -Patient is on significant amount of pain medication which is morphine and MS Contin. She declines using any extra doses. We'll continue to monitor here. She is requesting her pain medications be resumed. I discussed with patient at length that she is on too much pain medicine and discussed with her that she needs to cut back on some of her pain meds which is making her more drowsy droopy and had possibly this episode. -We'll defer to primary care physician to discuss with patient about her pain meds.   2. Rhabdomyolysis due to her being on the floor. -CPK down to 13,000.(20000)  3. Hypertension  -Resume all home meds.  4. Coronary artery disease continue aspirin   5. Depression continue Effexor  6. DMII ssi,resume insulin  Overall improved. We'll have physical therapy see patient. If continues to show improvement will DC home with physical therapy in the morning. 7. Miscellaneous we will give Lovenox for DVT prophylaxis    Case discussed with Care Management/Social Worker. Management plans discussed with the patient, family and they are in agreement.  CODE STATUS: Full  DVT Prophylaxis: Lovenox  TOTAL TIME TAKING CARE OF THIS PATIENT: 30 minutes.  >50% time spent on counselling and coordination of care  POSSIBLE D/C IN one DAYS, DEPENDING ON CLINICAL CONDITION.   Aneth Schlagel M.D on 12/08/2015 at 12:16 PM  Between 7am to 6pm - Pager - [phone]  After 6pm go to [email or web address] -  password [email or web address] [email or web address]  [facility]  Office  [phone]  CC: Primary care physician; Geoffery Lyonsurner, Eric M., PA

## 2015-12-08 NOTE — Progress Notes (Signed)
Pt reported increase of pain in bil legs. md notified and  Regular home meds of morphine  Restarted.  Assisted oob. tol well.

## 2015-12-08 NOTE — Progress Notes (Signed)
End of shift, bedside report [facility].

## 2015-12-08 NOTE — Plan of Care (Signed)
Problem: Education: Goal: Knowledge of diagnostic tests will improve Outcome: Progressing No tests today  Problem: Respiratory: Goal: Ability to achieve and maintain a regular respiratory rate will improve Outcome: Progressing No resp distress. See note regarding pain and results.

## 2015-12-08 NOTE — Care Management Note (Signed)
Case Management Note  Patient Details  Name: Jessica Donovan MRN: [id number] Date of Birth: 1946/05/19  Subjective/Objective:   Resume home health orders with [facility] for RN, PT, nurse aid at discharge.                  Action/Plan:   Expected Discharge Date:                  Expected Discharge Plan:     In-House Referral:     Discharge planning Services     Post Acute Care Choice:    Choice offered to:     DME Arranged:    DME Agency:     HH Arranged:    HH Agency:     Status of Service:     Medicare Important Message Given:    Date Medicare IM Given:    Medicare IM give by:    Date Additional Medicare IM Given:    Additional Medicare Important Message give by:     If discussed at Long Length of Stay Meetings, dates discussed:    Additional Comments:  [name] A, RN 12/08/2015, 3:51 PM

## 2015-12-09 LAB — GLUCOSE, CAPILLARY
Glucose-Capillary: 99 mg/dL (ref 65–99)
Glucose-Capillary: 172 mg/dL — ABNORMAL HIGH (ref 65–99)

## 2015-12-09 LAB — CK: CK TOTAL: 5861 U/L — AB (ref 38–234)

## 2015-12-09 NOTE — Progress Notes (Signed)
Pt for discharge home. Alert. Pt has chronic pain.on pain meds for this. Pt sees  A pain specialist.  Discharge instructions discussed with pt. Home meds discussed. Diet, activity and f/u discussed.  Verbalize understanding of  Discharge plans.

## 2015-12-09 NOTE — Discharge Summary (Signed)
[facility] - [place] at [facility]   PATIENT NAME: Jessica Donovan    MR#:  [id number]  DATE OF BIRTH:  [birth year]  DATE OF ADMISSION:  12/07/2015 ADMITTING PHYSICIAN: Auburn Bilberry, MD  DATE OF DISCHARGE: 12/09/15  PRIMARY CARE PHYSICIAN: Geoffery Lyons., PA    ADMISSION DIAGNOSIS:  Non-traumatic rhabdomyolysis [M62.82] Drug overdose, accidental or unintentional, initial encounter [T50.901A]  DISCHARGE DIAGNOSIS:  Acute Rhabdomyolysis-improved  SECONDARY DIAGNOSIS:   Past Medical History  Diagnosis Date  . Hypertension   . Diabetes mellitus without complication (HCC)   . Coronary artery disease   . Low back pain   . Stroke (HCC)   . Myocardial infarction (HCC)   . Chronic back pain     HOSPITAL COURSE:  Patient is a 69 year old white female recently hospitalized with hypotension and urinary retention and acute renal failure was found on the floor. Noted to have rhabdomyolysis.  1 generalized weakness with passing out spell transient this is likely again related to Polypharmacy.  Improved with Narcan.  -Patient is on significant amount of pain medication which is morphine and MS Contin. She declines using any extra doses.  - She is requesting her pain medications be resumed. I discussed with patient at length that she is on too much pain medicine and discussed with her that she needs to cut back on some of her pain meds which is making her more drowsy droopy and had possibly this episode. -We'll defer to primary care physician to discuss with patient about her pain meds.  -no respiratory distress. sats .92% on RA  2. Rhabdomyolysis due to her being on the floor. -CPK down to 13,000.(20000)---5000  3. Hypertension  -Resume all home meds.  4. Coronary artery disease continue aspirin   5. Depression continue Effexor  6. DMII ssi,resume insulin  7. Miscellaneous we will give Lovenox for DVT prophylaxis  Overall improved. PT recommends  HHPT  CONSULTS OBTAINED:     DRUG ALLERGIES:   Allergies  Allergen Reactions  . Codeine Nausea And Vomiting  . Influenza Vaccines Other (See Comments)    Passed out  . Methadone Hives  . Percocet [Oxycodone-Acetaminophen] Nausea And Vomiting  . Tetanus Toxoids Swelling  . Tetracyclines & Related Rash    DISCHARGE MEDICATIONS:   Current Discharge Medication List    CONTINUE these medications which have NOT CHANGED   Details  ALPRAZolam (XANAX) 1 MG tablet Take 1 tablet (1 mg total) by mouth 2 (two) times daily as needed for anxiety. Qty: 20 tablet, Refills: 0    aspirin EC 81 MG tablet Take 81 mg by mouth daily.    atorvastatin (LIPITOR) 20 MG tablet Take 20 mg by mouth daily.    diphenoxylate-atropine (LOMOTIL) 2.5-0.025 MG tablet Take 1 tablet by mouth 4 (four) times daily as needed for diarrhea or loose stools.    ergocalciferol (VITAMIN D2) 50000 UNITS capsule Take 50,000 Units by mouth once a week.    ferrous sulfate 325 (65 FE) MG tablet Take 325 mg by mouth daily with breakfast.    gabapentin (NEURONTIN) 100 MG capsule Take 100 mg by mouth.    isosorbide mononitrate (IMDUR) 30 MG 24 hr tablet Take 30 mg by mouth daily. Reported on 12/07/2015    liothyronine (CYTOMEL) 25 MCG tablet Take 25 mcg by mouth daily.    mirtazapine (REMERON) 15 MG tablet Take 15 mg by mouth at bedtime.    morphine (MS CONTIN) 30 MG 12 hr tablet Take 1 tablet (30  mg total) by mouth 3 (three) times daily. Qty: 30 tablet, Refills: 0    morphine (MSIR) 15 MG tablet Take 1 tablet (15 mg total) by mouth every 6 (six) hours as needed for severe pain. Qty: 30 tablet, Refills: 0    nitroGLYCERIN (NITROSTAT) 0.4 MG SL tablet Place 0.4 mg under the tongue every 5 (five) minutes as needed for chest pain.    omeprazole (PRILOSEC) 20 MG capsule Take 20 mg by mouth daily.    pregabalin (LYRICA) 75 MG capsule Take 1 capsule (75 mg total) by mouth 2 (two) times daily. Qty: 60 capsule, Refills: 0     solifenacin (VESICARE) 10 MG tablet Take by mouth daily.    ticagrelor (BRILINTA) 90 MG TABS tablet Take 90 mg by mouth 2 (two) times daily.    tolterodine (DETROL LA) 4 MG 24 hr capsule Take 4 mg by mouth daily.    venlafaxine XR (EFFEXOR-XR) 150 MG 24 hr capsule Take 150 mg by mouth daily with breakfast.    insulin glargine (LANTUS) 100 UNIT/ML injection Inject 0.25 mLs (25 Units total) into the skin at bedtime. Qty: 10 mL, Refills: 11    traZODone (DESYREL) 100 MG tablet Take 100 mg by mouth at bedtime as needed for sleep.        If you experience worsening of your admission symptoms, develop shortness of breath, life threatening emergency, suicidal or homicidal thoughts you must seek medical attention immediately by calling 911 or calling your MD immediately  if symptoms less severe.  You Must read complete instructions/literature along with all the possible adverse reactions/side effects for all the Medicines you take and that have been prescribed to you. Take any new Medicines after you have completely understood and accept all the possible adverse reactions/side effects.   Please note  You were cared for by a hospitalist during your hospital stay. If you have any questions about your discharge medications or the care you received while you were in the hospital after you are discharged, you can call the unit and asked to speak with the hospitalist on call if the hospitalist that took care of you is not available. Once you are discharged, your primary care physician will handle any further medical issues. Please note that NO REFILLS for any discharge medications will be authorized once you are discharged, as it is imperative that you return to your primary care physician (or establish a relationship with a primary care physician if you do not have one) for your aftercare needs so that they can reassess your need for medications and monitor your lab values. Today   SUBJECTIVE    Leg stiffness  VITAL SIGNS:  Blood pressure 149/62, pulse 68, temperature 97.8 F (36.6 C), temperature source Oral, resp. rate 18, height  (1.6 m), weight 68.04 kg (150 lb), SpO2 95 %.  I/O:   Intake/Output Summary (Last 24 hours) at 12/09/15 0719 Last data filed at 12/09/15 0447  Gross per 24 hour  Intake      0 ml  Output    600 ml  Net   -600 ml    PHYSICAL EXAMINATION:  GENERAL:  69 y.o.-year-old patient lying in the bed with no acute distress.  EYES: Pupils equal, round, reactive to light and accommodation. No scleral icterus. Extraocular muscles intact.  HEENT: Head atraumatic, normocephalic. Oropharynx and nasopharynx clear.  NECK:  Supple, no jugular venous distention. No thyroid enlargement, no tenderness.  LUNGS: Normal breath sounds bilaterally, no wheezing, rales,rhonchi or  crepitation. No use of accessory muscles of respiration.  CARDIOVASCULAR: S1, S2 normal. No murmurs, rubs, or gallops.  ABDOMEN: Soft, non-tender, non-distended. Bowel sounds present. No organomegaly or mass.  EXTREMITIES: No pedal edema, cyanosis, or clubbing.  NEUROLOGIC: Cranial nerves II through XII are intact. Muscle strength 5/5 in all extremities. Sensation intact. Gait not checked.  PSYCHIATRIC: The patient is alert and oriented x 3.  SKIN: No obvious rash, lesion, or ulcer.   DATA REVIEW:   CBC   Recent Labs Lab 12/08/15 0514  WBC 6.2  HGB 11.8*  HCT 36.3  PLT 228    Chemistries   Recent Labs Lab 12/07/15 1820 12/08/15 0514  NA 138 140  K 4.6 3.8  CL 106 109  CO2 26 26  GLUCOSE 193* 104*  BUN 17 12  CREATININE 0.76 0.59  CALCIUM 9.1 8.6*  AST 271*  --   ALT 37  --   ALKPHOS 78  --   BILITOT 0.4  --     Microbiology Results   No results found for this or any previous visit (from the past 240 hour(s)).  RADIOLOGY:  Dg Chest Port 1 View  12/07/2015  CLINICAL DATA:  Somnolence.  Found on the floor.  Lethargy. EXAM: PORTABLE CHEST 1 VIEW COMPARISON:   11/29/2015 FINDINGS: Low lung volumes persist, however improved lung volumes from prior exam. Minimal residual left greater than right basilar atelectasis. Cardiomediastinal contours are normal for technique. No confluent airspace disease, large pleural effusion or pneumothorax. No pulmonary edema. Degenerative change in both shoulders. IMPRESSION: Hypoventilatory chest, however improvement from prior exam. Mild residual bibasilar atelectasis. Electronically Signed   By: Rubye OaksMelanie  Ehinger M.D.   On: 12/07/2015 18:52     Management plans discussed with the patient, family and they are in agreement.  CODE STATUS:     Code Status Orders        Start     Ordered   12/07/15 2224  Full code   Continuous     12/07/15 2223    Advance Directive Documentation        Most Recent Value   Type of Advance Directive  Healthcare Power of Attorney, Living will   Pre-existing out of facility DNR order (yellow form or pink MOST form)     "MOST" Form in Place?        TOTAL TIME TAKING CARE OF THIS PATIENT: 40 minutes.    Cipriano Millikan M.D on 12/09/2015 at 7:19 AM  Between 7am to 6pm - Pager - [phone] After 6pm go to [email or web address] - password [email or web address] [facility]  [facility]  Office  [phone]  CC: Primary care physician; Geoffery Lyonsurner, Eric M., PA

## 2015-12-09 NOTE — Care Management Note (Signed)
Case Management Note  Patient Details  Name: Camdynn Maranto MRN: [id number] Date of Birth: [birth year]  Subjective/Objective:                  Met with patient who states she plans to discharge to home today. She states she has left a message for her friend Hilda Blades to provide transportation. She is open to [facility]- home health resumption orders will be needed prior to discharge. She has a walker available at home but typically uses a rollator to ambulate. Her PCP is Dr. Ouida Sills with [facility]. She denies difficulty obtaining Rx.  Action/Plan: I have notified Sharrie Rothman with [facility] of patient potential discharge today. RNCM will continue to follow.   Expected Discharge Date:                  Expected Discharge Plan:     In-House Referral:     Discharge planning Services     Post Acute Care Choice:  Home Health Choice offered to:  Patient  DME Arranged:    DME Agency:     HH Arranged:  PT [place]:  [place]  Status of Service:  In process, will continue to follow  Medicare Important Message Given:    Date Medicare IM Given:    Medicare IM give by:    Date Additional Medicare IM Given:    Additional Medicare Important Message give by:     If discussed at [place] of Stay Meetings, dates discussed:    Additional Comments:  Marshell Garfinkel, RN 12/09/2015, 11:35 AM

## 2015-12-09 NOTE — Care Management Important Message (Signed)
Important Message  Patient Details  Name: Jessica DanceBarbara Donovan MRN: [id number] Date of Birth: [birth year]   Medicare Important Message Given:  Yes    Collie SiadAngela Lyndzee Kliebert, RN 12/09/2015, 11:41 AM

## 2015-12-09 NOTE — Discharge Instructions (Signed)
HHPT °

## 2016-01-21 DIAGNOSIS — E1140 Type 2 diabetes mellitus with diabetic neuropathy, unspecified: Secondary | ICD-10-CM | POA: Diagnosis not present

## 2016-01-21 DIAGNOSIS — M86172 Other acute osteomyelitis, left ankle and foot: Secondary | ICD-10-CM | POA: Diagnosis not present

## 2016-01-21 DIAGNOSIS — L03032 Cellulitis of left toe: Secondary | ICD-10-CM | POA: Diagnosis not present

## 2016-01-23 ENCOUNTER — Inpatient Hospital Stay
Admission: AD | Admit: 2016-01-23 | Discharge: 2016-01-27 | DRG: 504 | Disposition: A | Discharge status: Skilled Nursing Facility | Payer: PPO | Source: Ambulatory Visit | Attending: Internal Medicine | Admitting: Internal Medicine

## 2016-01-23 DIAGNOSIS — Z79899 Other long term (current) drug therapy: Secondary | ICD-10-CM | POA: Diagnosis not present

## 2016-01-23 DIAGNOSIS — Z8673 Personal history of transient ischemic attack (TIA), and cerebral infarction without residual deficits: Secondary | ICD-10-CM

## 2016-01-23 DIAGNOSIS — Z66 Do not resuscitate: Secondary | ICD-10-CM | POA: Diagnosis present

## 2016-01-23 DIAGNOSIS — M869 Osteomyelitis, unspecified: Secondary | ICD-10-CM | POA: Diagnosis not present

## 2016-01-23 DIAGNOSIS — Z794 Long term (current) use of insulin: Secondary | ICD-10-CM | POA: Diagnosis not present

## 2016-01-23 DIAGNOSIS — Z886 Allergy status to analgesic agent status: Secondary | ICD-10-CM

## 2016-01-23 DIAGNOSIS — Z89422 Acquired absence of other left toe(s): Secondary | ICD-10-CM

## 2016-01-23 DIAGNOSIS — E1169 Type 2 diabetes mellitus with other specified complication: Secondary | ICD-10-CM | POA: Diagnosis present

## 2016-01-23 DIAGNOSIS — G8929 Other chronic pain: Secondary | ICD-10-CM | POA: Diagnosis not present

## 2016-01-23 DIAGNOSIS — M545 Low back pain: Secondary | ICD-10-CM | POA: Diagnosis present

## 2016-01-23 DIAGNOSIS — E1140 Type 2 diabetes mellitus with diabetic neuropathy, unspecified: Secondary | ICD-10-CM | POA: Diagnosis present

## 2016-01-23 DIAGNOSIS — Z885 Allergy status to narcotic agent status: Secondary | ICD-10-CM | POA: Diagnosis not present

## 2016-01-23 DIAGNOSIS — I2510 Atherosclerotic heart disease of native coronary artery without angina pectoris: Secondary | ICD-10-CM | POA: Diagnosis not present

## 2016-01-23 DIAGNOSIS — I10 Essential (primary) hypertension: Secondary | ICD-10-CM | POA: Diagnosis not present

## 2016-01-23 DIAGNOSIS — E1165 Type 2 diabetes mellitus with hyperglycemia: Secondary | ICD-10-CM | POA: Diagnosis not present

## 2016-01-23 DIAGNOSIS — M86172 Other acute osteomyelitis, left ankle and foot: Secondary | ICD-10-CM | POA: Diagnosis not present

## 2016-01-23 DIAGNOSIS — Z7982 Long term (current) use of aspirin: Secondary | ICD-10-CM | POA: Diagnosis not present

## 2016-01-23 DIAGNOSIS — Z887 Allergy status to serum and vaccine status: Secondary | ICD-10-CM

## 2016-01-23 DIAGNOSIS — E118 Type 2 diabetes mellitus with unspecified complications: Secondary | ICD-10-CM | POA: Diagnosis not present

## 2016-01-23 DIAGNOSIS — I252 Old myocardial infarction: Secondary | ICD-10-CM

## 2016-01-23 DIAGNOSIS — M868X7 Other osteomyelitis, ankle and foot: Secondary | ICD-10-CM | POA: Diagnosis not present

## 2016-01-23 DIAGNOSIS — E1110 Type 2 diabetes mellitus with ketoacidosis without coma: Secondary | ICD-10-CM

## 2016-01-23 DIAGNOSIS — Z888 Allergy status to other drugs, medicaments and biological substances status: Secondary | ICD-10-CM | POA: Diagnosis not present

## 2016-01-23 DIAGNOSIS — Z9889 Other specified postprocedural states: Secondary | ICD-10-CM | POA: Diagnosis not present

## 2016-01-23 DIAGNOSIS — Z8249 Family history of ischemic heart disease and other diseases of the circulatory system: Secondary | ICD-10-CM | POA: Diagnosis not present

## 2016-01-23 DIAGNOSIS — E039 Hypothyroidism, unspecified: Secondary | ICD-10-CM | POA: Diagnosis present

## 2016-01-23 DIAGNOSIS — L03116 Cellulitis of left lower limb: Secondary | ICD-10-CM | POA: Diagnosis present

## 2016-01-23 HISTORY — DX: Osteomyelitis, unspecified: M86.9

## 2016-01-23 LAB — CBC WITH DIFFERENTIAL/PLATELET
BASOS PCT: 0 %
Basophils Absolute: 0 10*3/uL (ref 0–0.1)
Eosinophils Absolute: 0.1 10*3/uL (ref 0–0.7)
Eosinophils Relative: 2 %
HEMATOCRIT: 35.1 % (ref 35.0–47.0)
HEMOGLOBIN: 11.9 g/dL — AB (ref 12.0–16.0)
LYMPHS ABS: 2 10*3/uL (ref 1.0–3.6)
LYMPHS PCT: 27 %
MCH: 30.9 pg (ref 26.0–34.0)
MCHC: 33.8 g/dL (ref 32.0–36.0)
MCV: 91.5 fL (ref 80.0–100.0)
MONO ABS: 0.7 10*3/uL (ref 0.2–0.9)
MONOS PCT: 9 %
NEUTROS ABS: 4.6 10*3/uL (ref 1.4–6.5)
NEUTROS PCT: 62 %
Platelets: 296 10*3/uL (ref 150–440)
RBC: 3.84 MIL/uL (ref 3.80–5.20)
RDW: 13.2 % (ref 11.5–14.5)
WBC: 7.5 10*3/uL (ref 3.6–11.0)

## 2016-01-23 LAB — BASIC METABOLIC PANEL
Anion gap: 6 (ref 5–15)
BUN: 21 mg/dL — AB (ref 6–20)
CO2: 23 mmol/L (ref 22–32)
Calcium: 9 mg/dL (ref 8.9–10.3)
Chloride: 103 mmol/L (ref 101–111)
Creatinine, Ser: 0.87 mg/dL (ref 0.44–1.00)
Glucose, Bld: 134 mg/dL — ABNORMAL HIGH (ref 65–99)
POTASSIUM: 4.9 mmol/L (ref 3.5–5.1)
SODIUM: 132 mmol/L — AB (ref 135–145)

## 2016-01-23 LAB — PROTIME-INR
INR: 1.03
PROTHROMBIN TIME: 13.7 s (ref 11.4–15.0)

## 2016-01-23 LAB — APTT: aPTT: 29 seconds (ref 24–36)

## 2016-01-23 LAB — GLUCOSE, CAPILLARY
GLUCOSE-CAPILLARY: 138 mg/dL — AB (ref 65–99)
GLUCOSE-CAPILLARY: 185 mg/dL — AB (ref 65–99)

## 2016-01-23 LAB — HEMOGLOBIN A1C: Hgb A1c MFr Bld: 7 % — ABNORMAL HIGH (ref 4.0–6.0)

## 2016-01-23 MED ORDER — ONDANSETRON HCL 4 MG/2ML IJ SOLN
4.0000 mg | Freq: Four times a day (QID) | INTRAMUSCULAR | Status: DC | PRN
  Filled 2016-01-23: qty 2

## 2016-01-23 MED ORDER — TRAZODONE HCL 100 MG PO TABS
100.0000 mg | ORAL_TABLET | Freq: Every evening | ORAL | Status: DC | PRN
  Administered 2016-01-23 – 2016-01-26 (×4): 100 mg via ORAL
  Filled 2016-01-23 (×4): qty 1

## 2016-01-23 MED ORDER — INSULIN ASPART 100 UNIT/ML ~~LOC~~ SOLN
0.0000 [IU] | Freq: Three times a day (TID) | SUBCUTANEOUS | Status: DC
  Administered 2016-01-23 – 2016-01-24 (×2): 1 [IU] via SUBCUTANEOUS
  Administered 2016-01-25 – 2016-01-26 (×3): 2 [IU] via SUBCUTANEOUS
  Administered 2016-01-27: 12:00:00 1 [IU] via SUBCUTANEOUS
  Filled 2016-01-23 (×2): qty 2
  Filled 2016-01-23 (×2): qty 1
  Filled 2016-01-23 (×2): qty 2
  Filled 2016-01-23: qty 1

## 2016-01-23 MED ORDER — SODIUM CHLORIDE 0.9 % IV SOLN
3.0000 g | Freq: Three times a day (TID) | INTRAVENOUS | Status: DC
  Administered 2016-01-23 – 2016-01-24 (×3): 3 g via INTRAVENOUS
  Filled 2016-01-23 (×5): qty 3

## 2016-01-23 MED ORDER — VANCOMYCIN HCL IN DEXTROSE 1-5 GM/200ML-% IV SOLN
1000.0000 mg | Freq: Once | INTRAVENOUS | Status: AC
  Administered 2016-01-23: 1000 mg via INTRAVENOUS
  Filled 2016-01-23: qty 200

## 2016-01-23 MED ORDER — ACETAMINOPHEN 650 MG RE SUPP: 650.0000 mg | Freq: Four times a day (QID) | RECTAL | Status: DC | PRN

## 2016-01-23 MED ORDER — MIRTAZAPINE 15 MG PO TABS
15.0000 mg | ORAL_TABLET | Freq: Every day | ORAL | Status: DC
  Administered 2016-01-23 – 2016-01-26 (×4): 15 mg via ORAL
  Filled 2016-01-23 (×4): qty 1

## 2016-01-23 MED ORDER — ATORVASTATIN CALCIUM 20 MG PO TABS
20.0000 mg | ORAL_TABLET | Freq: Every day | ORAL | Status: DC
  Administered 2016-01-25 – 2016-01-27 (×3): 20 mg via ORAL
  Filled 2016-01-23 (×3): qty 1

## 2016-01-23 MED ORDER — PREGABALIN 75 MG PO CAPS
75.0000 mg | ORAL_CAPSULE | Freq: Two times a day (BID) | ORAL | Status: DC
  Administered 2016-01-23 – 2016-01-27 (×7): 75 mg via ORAL
  Filled 2016-01-23 (×7): qty 1

## 2016-01-23 MED ORDER — ALBUTEROL SULFATE (2.5 MG/3ML) 0.083% IN NEBU: 2.5000 mg | INHALATION_SOLUTION | RESPIRATORY_TRACT | Status: DC | PRN

## 2016-01-23 MED ORDER — MORPHINE SULFATE ER 30 MG PO TBCR
30.0000 mg | EXTENDED_RELEASE_TABLET | Freq: Two times a day (BID) | ORAL | Status: DC
  Administered 2016-01-23 – 2016-01-26 (×5): 30 mg via ORAL
  Filled 2016-01-23 (×5): qty 1

## 2016-01-23 MED ORDER — ACETAMINOPHEN 325 MG PO TABS
650.0000 mg | ORAL_TABLET | Freq: Four times a day (QID) | ORAL | Status: DC | PRN
  Administered 2016-01-24 – 2016-01-27 (×4): 650 mg via ORAL
  Filled 2016-01-23 (×4): qty 2

## 2016-01-23 MED ORDER — LIOTHYRONINE SODIUM 25 MCG PO TABS
25.0000 ug | ORAL_TABLET | Freq: Every day | ORAL | Status: DC
  Administered 2016-01-25 – 2016-01-27 (×3): 25 ug via ORAL
  Filled 2016-01-23 (×3): qty 1

## 2016-01-23 MED ORDER — ALPRAZOLAM 1 MG PO TABS
1.0000 mg | ORAL_TABLET | Freq: Two times a day (BID) | ORAL | Status: DC | PRN
  Administered 2016-01-23 – 2016-01-25 (×4): 1 mg via ORAL
  Filled 2016-01-23 (×5): qty 1

## 2016-01-23 MED ORDER — HEPARIN SODIUM (PORCINE) 5000 UNIT/ML IJ SOLN: 5000.0000 [IU] | Freq: Three times a day (TID) | INTRAMUSCULAR | Status: DC

## 2016-01-23 MED ORDER — PANTOPRAZOLE SODIUM 40 MG PO TBEC
40.0000 mg | DELAYED_RELEASE_TABLET | Freq: Every day | ORAL | Status: DC
  Administered 2016-01-25 – 2016-01-27 (×3): 40 mg via ORAL
  Filled 2016-01-23 (×4): qty 1

## 2016-01-23 MED ORDER — INSULIN ASPART 100 UNIT/ML ~~LOC~~ SOLN: 0.0000 [IU] | Freq: Every day | SUBCUTANEOUS | Status: DC

## 2016-01-23 MED ORDER — VENLAFAXINE HCL ER 75 MG PO CP24
150.0000 mg | ORAL_CAPSULE | Freq: Every day | ORAL | Status: DC
  Administered 2016-01-25 – 2016-01-27 (×3): 150 mg via ORAL
  Filled 2016-01-23 (×4): qty 2

## 2016-01-23 MED ORDER — SODIUM CHLORIDE 0.9 % IV SOLN
250.0000 mL | INTRAVENOUS | Status: DC | PRN
  Administered 2016-01-24: 09:00:00 via INTRAVENOUS

## 2016-01-23 MED ORDER — SODIUM CHLORIDE 0.9% FLUSH: 3.0000 mL | INTRAVENOUS | Status: DC | PRN

## 2016-01-23 MED ORDER — ONDANSETRON HCL 4 MG PO TABS
4.0000 mg | ORAL_TABLET | Freq: Four times a day (QID) | ORAL | Status: DC | PRN
  Administered 2016-01-26: 4 mg via ORAL
  Filled 2016-01-23: qty 1

## 2016-01-23 MED ORDER — ASPIRIN EC 81 MG PO TBEC
81.0000 mg | DELAYED_RELEASE_TABLET | Freq: Every day | ORAL | Status: DC
  Administered 2016-01-25 – 2016-01-27 (×3): 81 mg via ORAL
  Filled 2016-01-23 (×3): qty 1

## 2016-01-23 MED ORDER — VANCOMYCIN HCL IN DEXTROSE 1-5 GM/200ML-% IV SOLN
1000.0000 mg | Freq: Two times a day (BID) | INTRAVENOUS | Status: DC
  Administered 2016-01-23 – 2016-01-26 (×5): 1000 mg via INTRAVENOUS
  Filled 2016-01-23 (×8): qty 200

## 2016-01-23 MED ORDER — SODIUM CHLORIDE 0.9% FLUSH
3.0000 mL | Freq: Two times a day (BID) | INTRAVENOUS | Status: DC
  Administered 2016-01-23 – 2016-01-26 (×4): 3 mL via INTRAVENOUS

## 2016-01-23 MED ORDER — MORPHINE SULFATE 15 MG PO TABS
15.0000 mg | ORAL_TABLET | Freq: Four times a day (QID) | ORAL | Status: DC | PRN
  Administered 2016-01-23: 23:00:00 15 mg via ORAL
  Filled 2016-01-23 (×2): qty 1

## 2016-01-23 NOTE — Progress Notes (Signed)
ANTIBIOTIC CONSULT NOTE - INITIAL  Pharmacy Consult for Vancomycin / Unasyn  Indication: osteomyelitis  Allergies  Allergen Reactions  . Codeine Nausea And Vomiting  . Influenza Vaccines Other (See Comments)    Passed out  . Methadone Hives  . Percocet [Oxycodone-Acetaminophen] Nausea And Vomiting  . Tetanus Toxoids Swelling  . Tetracyclines & Related Rash    Patient Measurements: Height:  (172.7 cm) Weight: 159 lb (72.122 kg) IBW/kg (Calculated) : 63.9 Adjusted Body Weight: 67.18 kg   Vital Signs: Temp: 97.5 F (36.4 C) (02/02 2110) Temp Source: Oral (02/02 2110) BP: 118/49 mmHg (02/02 2110) Pulse Rate: 82 (02/02 2110) Intake/Output from previous day:   Intake/Output from this shift:    Labs:  Recent Labs  01/23/16 1629 01/23/16 1649  WBC 7.5  --   HGB 11.9*  --   PLT 296  --   CREATININE  --  0.87   Estimated Creatinine Clearance: 61.6 mL/min (by C-G formula based on Cr of 0.87). No results for input(s): VANCOTROUGH, VANCOPEAK, VANCORANDOM, GENTTROUGH, GENTPEAK, GENTRANDOM, TOBRATROUGH, TOBRAPEAK, TOBRARND, AMIKACINPEAK, AMIKACINTROU, AMIKACIN in the last 72 hours.   Microbiology: No results found for this or any previous visit (from the past 720 hour(s)).  Medical History: Past Medical History  Diagnosis Date  . Hypertension   . Diabetes mellitus without complication (HCC)   . Coronary artery disease   . Low back pain   . Stroke (HCC)   . Myocardial infarction (HCC)   . Chronic back pain     Medications:  Scheduled:  . ampicillin-sulbactam (UNASYN) IV  3 g Intravenous Q8H  . insulin aspart  0-5 Units Subcutaneous QHS  . insulin aspart  0-9 Units Subcutaneous TID WC  . morphine  30 mg Oral Q12H  . sodium chloride flush  3 mL Intravenous Q12H  . vancomycin  1,000 mg Intravenous Q12H   Assessment: CrCl = 61.6 ml/min Ke = 0.058 hr-1 T1/2 = 12 hrs Vd = 50.5 L   Goal of Therapy:  Vancomycin trough level 15-20 mcg/ml  Plan:  Expected  duration 7 days with resolution of temperature and/or normalization of WBC   Vancomycin 1 gm IV X 1 given on 2/02 @ 17:00. Vancomycin 1 gm IV Q12H ordered to start on 2/02 @ 23:00, ~ 6 hrs after 1st dose. This pt will reach Css by 2/5 @ 5:00. Will draw 1st trough on 2/5 @ 10:30.   Jasn Xia D 01/23/2016,9:12 PM

## 2016-01-23 NOTE — H&P (Addendum)
[facility] - [place] at [facility]   PATIENT NAME: Jessica Donovan    MR#:  [id number]  DATE OF BIRTH:  [birth year]  DATE OF ADMISSION:  01/23/2016  PRIMARY CARE PHYSICIAN: Geoffery Lyons., PA   REQUESTING/REFERRING PHYSICIAN: Dr. Alberteen Spindle  CHIEF COMPLAINT:  No chief complaint on file.  left big toe osteomyelitis   HISTORY OF PRESENT ILLNESS:  Jessica Donovan  is a 70 y.o. female with a known history of Hypertension, diabetes and CAD. The patient is alert, awake and oriented. The patient complained of the left big toe pain and discharge for 2 days. She went to see Dr. Alberteen Spindle, we will send the patient for direct admission for possible surgery of toe osteomyelitis.  PAST MEDICAL HISTORY:   Past Medical History  Diagnosis Date  . Hypertension   . Diabetes mellitus without complication (HCC)   . Coronary artery disease   . Low back pain   . Stroke (HCC)   . Myocardial infarction (HCC)   . Chronic back pain     PAST SURGICAL HISTORY:   Past Surgical History  Procedure Laterality Date  . Toe amputation    . 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 Cardiac catheterization N/A 11/12/2015    Procedure: Left Heart Cath and Coronary Angiography;  Surgeon: Marcina Millard, MD;  Location: [facility] INVASIVE CV LAB;  Service: Cardiovascular;  Laterality: N/A;    SOCIAL HISTORY:   Social History  Substance Use Topics  . Smoking status: Never Smoker   . Smokeless tobacco: Not on file  . Alcohol Use: No    FAMILY HISTORY:   Family History  Problem Relation Age of Onset  . CAD Mother   . CAD Father     DRUG ALLERGIES:   Allergies  Allergen Reactions  . Codeine Nausea And Vomiting  . Influenza Vaccines Other (See Comments)    Passed out  . Methadone Hives  . Percocet [Oxycodone-Acetaminophen] Nausea And Vomiting  . Tetanus Toxoids Swelling  .  Tetracyclines & Related Rash    REVIEW OF SYSTEMS:  CONSTITUTIONAL: No fever, fatigue or weakness.  EYES: No blurred or double vision.  EARS, NOSE, AND THROAT: No tinnitus or ear pain.  RESPIRATORY: No cough, shortness of breath, wheezing or hemoptysis.  CARDIOVASCULAR: No chest pain, orthopnea, edema.  GASTROINTESTINAL: No nausea, vomiting, diarrhea or abdominal pain.  GENITOURINARY: No dysuria, hematuria.  ENDOCRINE: No polyuria, nocturia,  HEMATOLOGY: No anemia, easy bruising or bleeding SKIN: No rash or lesion. MUSCULOSKELETAL: Right big toe pain.[name]   NEUROLOGIC: No tingling, numbness, weakness.  PSYCHIATRY: No anxiety or depression.   MEDICATIONS AT HOME:   Prior to Admission medications   Medication Sig Start Date End Date Taking? Authorizing Provider  ALPRAZolam Prudy Feeler) 1 MG tablet Take 1 tablet (1 mg total) by mouth 2 (two) times daily as needed for anxiety. 11/12/15   Milagros Loll, MD  aspirin EC 81 MG tablet Take 81 mg by mouth daily.    Historical Provider, MD  atorvastatin (LIPITOR) 20 MG tablet Take 20 mg by mouth daily.    Historical Provider, MD  diphenoxylate-atropine (LOMOTIL) 2.5-0.025 MG tablet Take 1 tablet by mouth 4 (four) times daily as needed for diarrhea or loose stools.    Historical Provider, MD  ergocalciferol (VITAMIN D2) 50000 UNITS capsule Take 50,000 Units by mouth once a week. 11/10/15   Historical Provider,  MD  ferrous sulfate 325 (65 FE) MG tablet Take 325 mg by mouth daily with breakfast.    Historical Provider, MD  gabapentin (NEURONTIN) 100 MG capsule Take 100 mg by mouth.    Historical Provider, MD  insulin glargine (LANTUS) 100 UNIT/ML injection Inject 0.25 mLs (25 Units total) into the skin at bedtime. Patient not taking: Reported on 12/07/2015 11/12/15   Milagros Loll, MD  isosorbide mononitrate (IMDUR) 30 MG 24 hr tablet Take 30 mg by mouth daily. Reported on 12/07/2015    Historical Provider, MD  liothyronine (CYTOMEL) 25 MCG tablet Take 25  mcg by mouth daily.    Historical Provider, MD  mirtazapine (REMERON) 15 MG tablet Take 15 mg by mouth at bedtime.    Historical Provider, MD  morphine (MS CONTIN) 30 MG 12 hr tablet Take 1 tablet (30 mg total) by mouth 3 (three) times daily. 11/12/15   Milagros Loll, MD  morphine (MSIR) 15 MG tablet Take 1 tablet (15 mg total) by mouth every 6 (six) hours as needed for severe pain. 11/12/15   Milagros Loll, MD  nitroGLYCERIN (NITROSTAT) 0.4 MG SL tablet Place 0.4 mg under the tongue every 5 (five) minutes as needed for chest pain.    Historical Provider, MD  omeprazole (PRILOSEC) 20 MG capsule Take 20 mg by mouth daily.    Historical Provider, MD  pregabalin (LYRICA) 75 MG capsule Take 1 capsule (75 mg total) by mouth 2 (two) times daily. 11/12/15   Houston Siren, MD  solifenacin (VESICARE) 10 MG tablet Take by mouth daily.    Historical Provider, MD  ticagrelor (BRILINTA) 90 MG TABS tablet Take 90 mg by mouth 2 (two) times daily.    Historical Provider, MD  tolterodine (DETROL LA) 4 MG 24 hr capsule Take 4 mg by mouth daily.    Historical Provider, MD  traZODone (DESYREL) 100 MG tablet Take 100 mg by mouth at bedtime as needed for sleep.    Historical Provider, MD  venlafaxine XR (EFFEXOR-XR) 150 MG 24 hr capsule Take 150 mg by mouth daily with breakfast.    Historical Provider, MD      VITAL SIGNS:  Blood pressure 122/58, pulse 78, temperature 98.5 F (36.9 C), temperature source Oral, resp. rate 18, SpO2 97 %.  PHYSICAL EXAMINATION:  GENERAL:  70 y.o.-year-old patient lying in the bed with no acute distress.  EYES: Pupils equal, round, reactive to light and accommodation. No scleral icterus. Extraocular muscles intact.  HEENT: Head atraumatic, normocephalic. Oropharynx and nasopharynx clear.  NECK:  Supple, no jugular venous distention. No thyroid enlargement, no tenderness.  LUNGS: Normal breath sounds bilaterally, no wheezing, rales,rhonchi or crepitation. No use of accessory muscles  of respiration.  CARDIOVASCULAR: S1, S2 normal. No murmurs, rubs, or gallops.  ABDOMEN: Soft, nontender, nondistended. Bowel sounds present. No organomegaly or mass.  EXTREMITIES: No pedal edema, cyanosis, or clubbing.  NEUROLOGIC: Cranial nerves II through XII are intact. Muscle strength 5/5 in all extremities. Sensation intact. Gait not checked.  PSYCHIATRIC: The patient is alert and oriented x 3.  SKIN: No obvious rash, lesion, or ulcer.   LABORATORY PANEL:   CBC No results for input(s): WBC, HGB, HCT, PLT in the last 168 hours. ------------------------------------------------------------------------------------------------------------------  Chemistries  No results for input(s): NA, K, CL, CO2, GLUCOSE, BUN, CREATININE, CALCIUM, MG, AST, ALT, ALKPHOS, BILITOT in the last 168 hours.  Invalid input(s): GFRCGP ------------------------------------------------------------------------------------------------------------------  Cardiac Enzymes No results for input(s): TROPONINI in the last 168 hours. ------------------------------------------------------------------------------------------------------------------  RADIOLOGY:  No results found.  EKG:   Orders placed or performed during the hospital encounter of 12/07/15  . ED EKG  . ED EKG    IMPRESSION AND PLAN:  Left big toe osteomyelitis I will start vancomycin and Unasyn, nothing by mouth after midnight, hold heparin. subcutaneous, check PT,  INR,  follow-up with Dr. Alberteen Spindle for possible surgery tomorrow.  I ordered CBC, PT, INR and BMP,  Lab is pending.  HTN. Continue hypertension medication. DM.  Start Sliding scale.  CAD. Hold ASA and brillinta..    All the records are reviewed and case discussed with ED provider. Management plans discussed with the patient, family and they are in agreement.  CODE STATUS: DO NOT RESUSCITATE   TOTAL TIME TAKING CARE OF THIS PATIENT:   52 minutes.    Shaune Pollack M.D on 01/23/2016 at  4:38 PM  Between 7am to 6pm - Pager - [phone]  After 6pm go to [email or web address] - password [email or web address] [facility]  [place] [facility]  Office  [phone]  CC: Primary care physician; Geoffery Lyons., PA

## 2016-01-23 NOTE — Consult Note (Signed)
Reason for Consult: Osteomyelitis of the left second toe Referring Physician: [facility]  Jessica Donovan is an 70 y.o. female.  HPI: The patient has a history of poorly controlled diabetes with associated neuropathy. 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 A couple of weeks ago she had an injury where she fell off of a chair and injured the left second toe. Recently a few days ago started to notice some bleeding from the toe. She was seen outpatient in the office with exposed bone. Decision was made to stop her blood thinners and admit for IV antibiotics and amputation of the second toe.  Past Medical History  Diagnosis Date  . Hypertension   . Diabetes mellitus without complication (HCC)   . Coronary artery disease   . Low back pain   . Stroke (HCC)   . Myocardial infarction (HCC)   . Chronic back pain     Past Surgical History  Procedure Laterality Date  . Toe amputation    . Amputation toe Left 11/10/2015    Procedure: AMPUTATION TOE;  Surgeon: Deshone Lyssy, MD;  Location: [facility] ORS;  Service: Podiatry;  Laterality: Left;  . Cardiac catheterization N/A 11/12/2015    Procedure: Left Heart Cath and Coronary Angiography;  Surgeon: Alexander Paraschos, MD;  Location: [facility] INVASIVE CV LAB;  Service: Cardiovascular;  Laterality: N/A;    Family History  Problem Relation Age of Onset  . CAD Mother   . CAD Father     Social History:  reports that she has never smoked. She does not have any smokeless tobacco history on file. She reports that she does not drink alcohol or use illicit drugs.  Allergies:  Allergies  Allergen Reactions  . Codeine Nausea And Vomiting  . Influenza Vaccines Other (See Comments)    Passed out  . Methadone Hives  . Percocet [Oxycodone-Acetaminophen] Nausea And Vomiting  . Tetanus Toxoids Swelling  . Tetracyclines & Related Rash    Medications:  Scheduled: . ampicillin-sulbactam (UNASYN) IV  3 g Intravenous Q8H  . insulin aspart   0-5 Units Subcutaneous QHS  . insulin aspart  0-9 Units Subcutaneous TID WC  . morphine  30 mg Oral Q12H  . sodium chloride flush  3 mL Intravenous Q12H  . vancomycin  1,000 mg Intravenous Q12H    Results for orders placed or performed during the hospital encounter of 01/23/16 (from the past 48 hour(s))  CBC WITH DIFFERENTIAL     Status: Abnormal   Collection Time: 01/23/16  4:29 PM  Result Value Ref Range   WBC 7.5 3.6 - 11.0 K/uL   RBC 3.84 3.80 - 5.20 MIL/uL   Hemoglobin 11.9 (L) 12.0 - 16.0 g/dL   HCT 35.1 35.0 - 47.0 %   MCV 91.5 80.0 - 100.0 fL   MCH 30.9 26.0 - 34.0 pg   MCHC 33.8 32.0 - 36.0 g/dL   RDW 13.2 11.5 - 14.5 %   Platelets 296 150 - 440 K/uL   Neutrophils Relative % 62 %   Neutro Abs 4.6 1.4 - 6.5 K/uL   Lymphocytes Relative 27 %   Lymphs Abs 2.0 1.0 - 3.6 K/uL   Monocytes Relative 9 %   Monocytes Absolute 0.7 0.2 - 0.9 K/uL   Eosinophils Relative 2 %   Eosinophils Absolute 0.1 0 - 0.7 K/uL   Basophils Relative 0 %   Basophils Absolute 0.0 0 - 0.1 K/uL  APTT     Status: None   Collection Time: 01/23/16    4:29 PM  Result Value Ref Range   aPTT 29 24 - 36 seconds  Protime-INR     Status: None   Collection Time: 01/23/16  4:29 PM  Result Value Ref Range   Prothrombin Time 13.7 11.4 - 15.0 seconds   INR 1.03   Hemoglobin A1c     Status: Abnormal   Collection Time: 01/23/16  4:29 PM  Result Value Ref Range   Hgb A1c MFr Bld 7.0 (H) 4.0 - 6.0 %  Glucose, capillary     Status: Abnormal   Collection Time: 01/23/16  4:35 PM  Result Value Ref Range   Glucose-Capillary 138 (H) 65 - 99 mg/dL  Basic metabolic panel     Status: Abnormal   Collection Time: 01/23/16  4:49 PM  Result Value Ref Range   Sodium 132 (L) 135 - 145 mmol/L   Potassium 4.9 3.5 - 5.1 mmol/L   Chloride 103 101 - 111 mmol/L   CO2 23 22 - 32 mmol/L   Glucose, Bld 134 (H) 65 - 99 mg/dL   BUN 21 (H) 6 - 20 mg/dL   Creatinine, Ser 0.87 0.44 - 1.00 mg/dL   Calcium 9.0 8.9 - 10.3 mg/dL   GFR  calc non Af Amer >60 >60 mL/min   GFR calc Af Amer >60 >60 mL/min    Comment: (NOTE) The eGFR has been calculated using the CKD EPI equation. This calculation has not been validated in all clinical situations. eGFR's persistently <60 mL/min signify possible Chronic Kidney Disease.    Anion gap 6 5 - 15  Glucose, capillary     Status: Abnormal   Collection Time: 01/23/16  9:11 PM  Result Value Ref Range   Glucose-Capillary 185 (H) 65 - 99 mg/dL   Comment 1 Notify RN     No results found.  Review of Systems  Constitutional: Negative.   HENT: Negative.   Eyes: Negative.   Respiratory: Negative.   Cardiovascular: Negative.   Gastrointestinal: Negative.   Genitourinary: Negative.   Skin:       Ulceration on her left second toe with some recent bleeding and drainage.  Neurological:       Significant neuropathy related to her diabetes  Endo/Heme/Allergies: Negative.   Psychiatric/Behavioral: Negative.    Blood pressure 118/49, pulse 82, temperature 97.5 F (36.4 C), temperature source Oral, resp. rate 18, height 5' 8" (1.727 m), weight 72.122 kg (159 lb), SpO2 95 %. Physical Exam  Cardiovascular:  DP and PT pulses are palpable.  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 Digital contractures are noted bilateral.  Neurological:  Loss of protective sensation bilateral  Skin:  The skin is warm dry and supple. Full thickness ulceration with exposed bone at the distal aspect of the left second toe. Scabbed over abrasion on the left fifth toe. Also a dry scab with minimal surrounding erythema on the dorsal right midfoot.    Assessment/Plan: Assessment: 1. Osteomyelitis left second toe. 2. Diabetes with associated neuropathy.  Plan: Discussed with the patient the need for amputation of the second toe. Discussed possible risks and complications including continued infection or inability to heal the wound requiring further amputation. Questions  invited and answered. We will obtain a consent form for amputation of the left second toe. The patient will be nothing by mouth after midnight. Plan for surgery tomorrow morning.  Marcoantonio Legault W. 01/23/2016, 9:23 PM      

## 2016-01-24 ENCOUNTER — Encounter
Admission: AD | Disposition: A | Discharge status: Skilled Nursing Facility | Payer: Self-pay | Source: Ambulatory Visit | Attending: Internal Medicine

## 2016-01-24 ENCOUNTER — Ambulatory Visit: Admission: RE | Admit: 2016-01-24 | Payer: PPO | Source: Ambulatory Visit

## 2016-01-24 ENCOUNTER — Inpatient Hospital Stay: Payer: PPO | Admitting: *Deleted

## 2016-01-24 ENCOUNTER — Encounter: Payer: Self-pay | Admitting: Podiatry

## 2016-01-24 DIAGNOSIS — I2510 Atherosclerotic heart disease of native coronary artery without angina pectoris: Secondary | ICD-10-CM | POA: Diagnosis not present

## 2016-01-24 DIAGNOSIS — M869 Osteomyelitis, unspecified: Secondary | ICD-10-CM | POA: Diagnosis not present

## 2016-01-24 DIAGNOSIS — L03116 Cellulitis of left lower limb: Secondary | ICD-10-CM | POA: Diagnosis not present

## 2016-01-24 DIAGNOSIS — E1169 Type 2 diabetes mellitus with other specified complication: Secondary | ICD-10-CM | POA: Diagnosis not present

## 2016-01-24 DIAGNOSIS — E118 Type 2 diabetes mellitus with unspecified complications: Secondary | ICD-10-CM | POA: Diagnosis not present

## 2016-01-24 DIAGNOSIS — I10 Essential (primary) hypertension: Secondary | ICD-10-CM | POA: Diagnosis not present

## 2016-01-24 HISTORY — PX: AMPUTATION TOE: SHX6595

## 2016-01-24 LAB — CBC
HCT: 33 % — ABNORMAL LOW (ref 35.0–47.0)
HEMOGLOBIN: 11 g/dL — AB (ref 12.0–16.0)
MCH: 31.4 pg (ref 26.0–34.0)
MCHC: 33.5 g/dL (ref 32.0–36.0)
MCV: 94 fL (ref 80.0–100.0)
Platelets: 244 10*3/uL (ref 150–440)
RBC: 3.51 MIL/uL — ABNORMAL LOW (ref 3.80–5.20)
RDW: 13.2 % (ref 11.5–14.5)
WBC: 4.2 10*3/uL (ref 3.6–11.0)

## 2016-01-24 LAB — GLUCOSE, CAPILLARY
GLUCOSE-CAPILLARY: 114 mg/dL — AB (ref 65–99)
GLUCOSE-CAPILLARY: 119 mg/dL — AB (ref 65–99)
Glucose-Capillary: 107 mg/dL — ABNORMAL HIGH (ref 65–99)
Glucose-Capillary: 140 mg/dL — ABNORMAL HIGH (ref 65–99)
Glucose-Capillary: 148 mg/dL — ABNORMAL HIGH (ref 65–99)

## 2016-01-24 LAB — BASIC METABOLIC PANEL
ANION GAP: 7 (ref 5–15)
BUN: 15 mg/dL (ref 6–20)
CALCIUM: 8.9 mg/dL (ref 8.9–10.3)
CO2: 27 mmol/L (ref 22–32)
CREATININE: 0.7 mg/dL (ref 0.44–1.00)
Chloride: 104 mmol/L (ref 101–111)
Glucose, Bld: 120 mg/dL — ABNORMAL HIGH (ref 65–99)
Potassium: 4.5 mmol/L (ref 3.5–5.1)
SODIUM: 138 mmol/L (ref 135–145)

## 2016-01-24 LAB — MRSA PCR SCREENING: MRSA by PCR: POSITIVE — AB

## 2016-01-24 SURGERY — AMPUTATION, TOE
Anesthesia: Monitor Anesthesia Care | Laterality: Left

## 2016-01-24 MED ORDER — LIDOCAINE HCL (CARDIAC) 20 MG/ML IV SOLN
INTRAVENOUS | Status: DC | PRN
  Administered 2016-01-24: 30 mg via INTRAVENOUS

## 2016-01-24 MED ORDER — SODIUM CHLORIDE 0.9 % IV SOLN
3.0000 g | Freq: Four times a day (QID) | INTRAVENOUS | Status: DC
  Administered 2016-01-24 – 2016-01-27 (×10): 3 g via INTRAVENOUS
  Filled 2016-01-24 (×14): qty 3

## 2016-01-24 MED ORDER — GLUCERNA SHAKE PO LIQD
237.0000 mL | Freq: Three times a day (TID) | ORAL | Status: DC
  Administered 2016-01-24 – 2016-01-27 (×9): 237 mL via ORAL

## 2016-01-24 MED ORDER — BUPIVACAINE HCL 0.5 % IJ SOLN
INTRAMUSCULAR | Status: DC | PRN
  Administered 2016-01-24: 10 mL

## 2016-01-24 MED ORDER — MORPHINE SULFATE 15 MG PO TABS
15.0000 mg | ORAL_TABLET | Freq: Four times a day (QID) | ORAL | Status: DC | PRN
  Administered 2016-01-24 – 2016-01-27 (×6): 15 mg via ORAL
  Filled 2016-01-24 (×5): qty 1

## 2016-01-24 MED ORDER — MORPHINE SULFATE (PF) 2 MG/ML IV SOLN
2.0000 mg | INTRAVENOUS | Status: DC | PRN
  Filled 2016-01-24: qty 1

## 2016-01-24 MED ORDER — PROPOFOL 10 MG/ML IV BOLUS
INTRAVENOUS | Status: DC | PRN
  Administered 2016-01-24: 20 mg via INTRAVENOUS

## 2016-01-24 MED ORDER — SODIUM CHLORIDE 0.9 % IV SOLN
INTRAVENOUS | Status: DC
  Administered 2016-01-24 – 2016-01-27 (×4): via INTRAVENOUS

## 2016-01-24 MED ORDER — PROPOFOL 500 MG/50ML IV EMUL
INTRAVENOUS | Status: DC | PRN
  Administered 2016-01-24: 100 ug/kg/min via INTRAVENOUS

## 2016-01-24 MED ORDER — METOCLOPRAMIDE HCL 5 MG/ML IJ SOLN: 10.0000 mg | Freq: Once | INTRAMUSCULAR | Status: DC | PRN

## 2016-01-24 MED ORDER — DARIFENACIN HYDROBROMIDE ER 7.5 MG PO TB24
15.0000 mg | ORAL_TABLET | Freq: Every day | ORAL | Status: DC
  Administered 2016-01-24 – 2016-01-27 (×4): 15 mg via ORAL
  Filled 2016-01-24 (×4): qty 2
  Filled 2016-01-24: qty 1

## 2016-01-24 MED ORDER — SODIUM CHLORIDE 0.9% FLUSH: 3.0000 mL | INTRAVENOUS | Status: DC | PRN

## 2016-01-24 MED ORDER — MORPHINE SULFATE (PF) 2 MG/ML IV SOLN
2.0000 mg | INTRAVENOUS | Status: DC | PRN
  Administered 2016-01-25 – 2016-01-26 (×4): 2 mg via INTRAVENOUS
  Filled 2016-01-24 (×4): qty 1

## 2016-01-24 MED ORDER — NEOMYCIN-POLYMYXIN B GU 40-200000 IR SOLN
Status: DC | PRN
  Administered 2016-01-24: 2 mL

## 2016-01-24 MED ORDER — FENTANYL CITRATE (PF) 100 MCG/2ML IJ SOLN: 25.0000 ug | INTRAMUSCULAR | Status: DC | PRN

## 2016-01-24 MED ORDER — CYCLOBENZAPRINE HCL 10 MG PO TABS
10.0000 mg | ORAL_TABLET | Freq: Every day | ORAL | Status: DC
  Administered 2016-01-24 – 2016-01-25 (×2): 10 mg via ORAL
  Filled 2016-01-24 (×2): qty 1

## 2016-01-24 MED ORDER — SODIUM CHLORIDE 0.9 % IV SOLN: 250.0000 mL | INTRAVENOUS | Status: DC | PRN

## 2016-01-24 MED ORDER — SODIUM CHLORIDE 0.9% FLUSH
3.0000 mL | Freq: Two times a day (BID) | INTRAVENOUS | Status: DC
  Administered 2016-01-24 – 2016-01-26 (×3): 3 mL via INTRAVENOUS

## 2016-01-24 MED ORDER — SENNA 8.6 MG PO TABS
1.0000 | ORAL_TABLET | Freq: Two times a day (BID) | ORAL | Status: DC
  Administered 2016-01-24 – 2016-01-27 (×5): 8.6 mg via ORAL
  Filled 2016-01-24 (×7): qty 1

## 2016-01-24 SURGICAL SUPPLY — 43 items
BANDAGE ACE 4X5 VEL STRL LF (GAUZE/BANDAGES/DRESSINGS) ×3 IMPLANT
BLADE MED AGGRESSIVE (BLADE) ×3 IMPLANT
BLADE OSC/SAGITTAL MD 5.5X18 (BLADE) ×3 IMPLANT
BLADE SURG 15 STRL LF DISP TIS (BLADE) ×2 IMPLANT
BLADE SURG 15 STRL SS (BLADE) ×4
BLADE SURG MINI STRL (BLADE) ×3 IMPLANT
BNDG ESMARK 4X12 TAN STRL LF (GAUZE/BANDAGES/DRESSINGS) ×3 IMPLANT
BNDG GAUZE 4.5X4.1 6PLY STRL (MISCELLANEOUS) ×3 IMPLANT
CANISTER SUCT 1200ML W/VALVE (MISCELLANEOUS) ×3 IMPLANT
CLOSURE WOUND 1/4X4 (GAUZE/BANDAGES/DRESSINGS) ×1
CUFF TOURN 18 STER (MISCELLANEOUS) ×3 IMPLANT
CUFF TOURN DUAL PL 12 NO SLV (MISCELLANEOUS) ×3 IMPLANT
DRAPE FLUOR MINI C-ARM 54X84 (DRAPES) ×3 IMPLANT
DURAPREP 26ML APPLICATOR (WOUND CARE) ×3 IMPLANT
ELECT REM PT RETURN 9FT ADLT (ELECTROSURGICAL) ×3
ELECTRODE REM PT RTRN 9FT ADLT (ELECTROSURGICAL) ×1 IMPLANT
GAUZE PETRO XEROFOAM 1X8 (MISCELLANEOUS) ×3 IMPLANT
GAUZE SPONGE 4X4 12PLY STRL (GAUZE/BANDAGES/DRESSINGS) ×3 IMPLANT
GAUZE STRETCH 2X75IN STRL (MISCELLANEOUS) ×3 IMPLANT
GLOVE BIO SURGEON STRL SZ7.5 (GLOVE) ×3 IMPLANT
GLOVE INDICATOR 8.0 STRL GRN (GLOVE) ×3 IMPLANT
GOWN STRL REUS W/ TWL LRG LVL3 (GOWN DISPOSABLE) ×2 IMPLANT
GOWN STRL REUS W/TWL LRG LVL3 (GOWN DISPOSABLE) ×4
HANDPIECE VERSAJET DEBRIDEMENT (MISCELLANEOUS) ×3 IMPLANT
KIT RM TURNOVER STRD PROC AR (KITS) ×3 IMPLANT
LABEL OR SOLS (LABEL) ×3 IMPLANT
NEEDLE FILTER BLUNT 18X 1/2SAF (NEEDLE) ×2
NEEDLE FILTER BLUNT 18X1 1/2 (NEEDLE) ×1 IMPLANT
NEEDLE HYPO 25X1 1.5 SAFETY (NEEDLE) ×6 IMPLANT
NS IRRIG 500ML POUR BTL (IV SOLUTION) ×3 IMPLANT
PACK EXTREMITY ARMC (MISCELLANEOUS) ×3 IMPLANT
SOL .9 NS 3000ML IRR  AL (IV SOLUTION) ×2
SOL .9 NS 3000ML IRR UROMATIC (IV SOLUTION) ×1 IMPLANT
SOL PREP PVP 2OZ (MISCELLANEOUS) ×3
SOLUTION PREP PVP 2OZ (MISCELLANEOUS) ×1 IMPLANT
STOCKINETTE STRL 6IN 960660 (GAUZE/BANDAGES/DRESSINGS) ×3 IMPLANT
STRIP CLOSURE SKIN 1/4X4 (GAUZE/BANDAGES/DRESSINGS) ×2 IMPLANT
SUT ETHILON 4-0 (SUTURE) ×4
SUT ETHILON 4-0 FS2 18XMFL BLK (SUTURE) ×2
SUT ETHILON 5-0 FS-2 18 BLK (SUTURE) ×3 IMPLANT
SUTURE ETHLN 4-0 FS2 18XMF BLK (SUTURE) ×2 IMPLANT
SWAB DUAL CULTURE TRANS RED ST (MISCELLANEOUS) ×3 IMPLANT
SYRINGE 10CC LL (SYRINGE) ×3 IMPLANT

## 2016-01-24 NOTE — OR Nursing (Signed)
Patient is very sleepy but arouses to name and answers questions but quickly drifts back to sleep. She was able to confirm consent and sign.

## 2016-01-24 NOTE — Transfer of Care (Signed)
Immediate Anesthesia Transfer of Care Note  Patient: Jessica Donovan  Procedure(s) Performed: Procedure(s): AMPUTATION TOE (2nd mpj) (Left)  Patient Location:    Anesthesia Type:MAC  Level of Consciousness: sedated and patient cooperative  Airway & Oxygen Therapy: Patient Spontanous Breathing and Patient connected to face mask oxygen  Post-op Assessment: Report given to RN and Post -op Vital signs reviewed and stable  Post vital signs: Reviewed and stable  Last Vitals:  Filed Vitals:   01/24/16 0525 01/24/16 0901  BP: 109/45 113/50  Pulse: 73 70  Temp:  35.5 C  Resp:  16    Complications: No apparent anesthesia complications

## 2016-01-24 NOTE — Consult Note (Signed)
[facility]     Reason for Consult:OSteo, DM foot infection    Referring Physician: Bridgett Larsson Date of Admission:  01/23/2016   Active Problems:   Osteomyelitis of toe (HCC)   HPI: Jessica Donovan is a 70 y.o. female with poorly controlled DM with PN, CAD admitted 2/2  with L great toe infection.  She has had previous toe amputation  Direct admitted from podiatry after noted to have exposed bone. Had toe injury a few weeks ago. Now s/p amputation L 2nd toe 01/23/16 by Dr Cleda Mccreedy. Apparently good healty tissue remained after removal of the toe in toto   On prior cx in Nov had MSSA infection.  Past Medical History  Diagnosis Date  . Hypertension   . Diabetes mellitus without complication ([place])   . Coronary artery disease   . Low back pain   . Stroke ([place])   . Myocardial infarction ([place])   . Chronic back pain    Past Surgical History  Procedure Laterality Date  . Toe amputation    . Amputation toe Left 11/10/2015    Procedure: AMPUTATION TOE;  Surgeon: Sharlotte Alamo, MD;  Location: [facility] ORS;  Service: Podiatry;  Laterality: Left;  . Cardiac catheterization N/A 11/12/2015    Procedure: Left Heart Cath and Coronary Angiography;  Surgeon: Isaias Cowman, MD;  Location: [place] [facility];  Service: Cardiovascular;  Laterality: N/A;  . Amputation toe Left 01/24/2016    Procedure: AMPUTATION TOE (2nd mpj);  Surgeon: Sharlotte Alamo, DPM;  Location: [facility] ORS;  Service: Podiatry;  Laterality: Left;   Social History  Substance Use Topics  . Smoking status: Never Smoker   . Smokeless tobacco: None  . Alcohol Use: No   Family History  Problem Relation Age of Onset  . CAD Mother   . CAD Father     Allergies:  Allergies  Allergen Reactions  . Codeine Nausea And Vomiting  . Influenza Vaccines Other (See Comments)    Passed out  . Methadone Hives  . Percocet [Oxycodone-Acetaminophen] Nausea And Vomiting  . Tetanus Toxoids Swelling  . Tetracyclines & Related Rash     Current antibiotics: Antibiotics Given (last 72 hours)    Date/Time Action Medication Dose Rate   01/23/16 1646 Given   vancomycin (VANCOCIN) IVPB 1000 mg/200 mL premix 1,000 mg 200 mL/hr   01/23/16 2040 Given   Ampicillin-Sulbactam (UNASYN) 3 g in sodium chloride 0.9 % 100 mL IVPB 3 g 100 mL/hr   01/23/16 2324 Given   vancomycin (VANCOCIN) IVPB 1000 mg/200 mL premix 1,000 mg 200 mL/hr   01/24/16 0327 Given   Ampicillin-Sulbactam (UNASYN) 3 g in sodium chloride 0.9 % 100 mL IVPB 3 g 100 mL/hr   01/24/16 1221 Given   Ampicillin-Sulbactam (UNASYN) 3 g in sodium chloride 0.9 % 100 mL IVPB 3 g 100 mL/hr      MEDICATIONS: . ampicillin-sulbactam (UNASYN) IV  3 g Intravenous Q6H  . aspirin EC  81 mg Oral Daily  . atorvastatin  20 mg Oral Daily  . feeding supplement (GLUCERNA SHAKE)  237 mL Oral TID WC  . insulin aspart  0-5 Units Subcutaneous QHS  . insulin aspart  0-9 Units Subcutaneous TID WC  . liothyronine  25 mcg Oral Daily  . mirtazapine  15 mg Oral QHS  . morphine  30 mg Oral Q12H  . pantoprazole  40 mg Oral QAC breakfast  . pregabalin  75 mg Oral BID  . senna  1 tablet Oral BID  .  sodium chloride flush  3 mL Intravenous Q12H  . sodium chloride flush  3 mL Intravenous Q12H  . vancomycin  1,000 mg Intravenous Q12H  . venlafaxine XR  150 mg Oral Q breakfast    Review of Systems - 11 systems reviewed and negative per HPI   OBJECTIVE: Temp:  [95.9 F (35.5 C)-98.5 F (36.9 C)] 98 F (36.7 C) (02/03 1309) Pulse Rate:  [67-82] 79 (02/03 1309) Resp:  [14-20] 20 (02/03 1309) BP: (100-134)/(36-65) 134/51 mmHg (02/03 1309) SpO2:  [93 %-99 %] 98 % (02/03 1309) Weight:  [72.122 kg (159 lb)] 72.122 kg (159 lb) (02/02 1700) Physical Exam  Constitutional:  oriented to person, place, and time. appears well-developed and well-nourished. No distress.  HENT: [place]/AT, PERRLA, no scleral icterus Mouth/Throat: Oropharynx is clear and moist. No oropharyngeal exudate.  Cardiovascular:  Normal rate, regular rhythm and normal heart sounds. Exam reveals no gallop and no friction rub.  No murmur heard.  Pulmonary/Chest: Effort normal and breath sounds normal. No respiratory distress.  has no wheezes.  Neck = supple, no nuchal rigidity Abdominal: Soft. Bowel sounds are normal.  exhibits no distension. There is no tenderness.  Lymphadenopathy: no cervical adenopathy. No axillary adenopathy Neurological: alert and oriented to person, place, and time.  Skin: L leg wrapped post op Psychiatric: a normal mood and affect.  behavior is normal.    LABS: Results for orders placed or performed during the hospital encounter of 01/23/16 (from the past 48 hour(s))  CBC WITH DIFFERENTIAL     Status: Abnormal   Collection Time: 01/23/16  4:29 PM  Result Value Ref Range   WBC 7.5 3.6 - 11.0 K/uL   RBC 3.84 3.80 - 5.20 MIL/uL   Hemoglobin 11.9 (L) 12.0 - 16.0 g/dL   HCT 35.1 35.0 - 47.0 %   MCV 91.5 80.0 - 100.0 fL   MCH 30.9 26.0 - 34.0 pg   MCHC 33.8 32.0 - 36.0 g/dL   RDW 13.2 11.5 - 14.5 %   Platelets 296 150 - 440 K/uL   Neutrophils Relative % 62 %   Neutro Abs 4.6 1.4 - 6.5 K/uL   Lymphocytes Relative 27 %   Lymphs Abs 2.0 1.0 - 3.6 K/uL   Monocytes Relative 9 %   Monocytes Absolute 0.7 0.2 - 0.9 K/uL   Eosinophils Relative 2 %   Eosinophils Absolute 0.1 0 - 0.7 K/uL   Basophils Relative 0 %   Basophils Absolute 0.0 0 - 0.1 K/uL  APTT     Status: None   Collection Time: 01/23/16  4:29 PM  Result Value Ref Range   aPTT 29 24 - 36 seconds  Protime-INR     Status: None   Collection Time: 01/23/16  4:29 PM  Result Value Ref Range   Prothrombin Time 13.7 11.4 - 15.0 seconds   INR 1.03   Hemoglobin A1c     Status: Abnormal   Collection Time: 01/23/16  4:29 PM  Result Value Ref Range   Hgb A1c MFr Bld 7.0 (H) 4.0 - 6.0 %  Glucose, capillary     Status: Abnormal   Collection Time: 01/23/16  4:35 PM  Result Value Ref Range   Glucose-Capillary 138 (H) 65 - 99 mg/dL  Basic  metabolic panel     Status: Abnormal   Collection Time: 01/23/16  4:49 PM  Result Value Ref Range   Sodium 132 (L) 135 - 145 mmol/L   Potassium 4.9 3.5 - 5.1 mmol/L   Chloride  103 101 - 111 mmol/L   CO2 23 22 - 32 mmol/L   Glucose, Bld 134 (H) 65 - 99 mg/dL   BUN 21 (H) 6 - 20 mg/dL   Creatinine, Ser 0.87 0.44 - 1.00 mg/dL   Calcium 9.0 8.9 - 10.3 mg/dL   GFR calc non Af Amer >60 >60 mL/min   GFR calc Af Amer >60 >60 mL/min    Comment: (NOTE) The eGFR has been calculated using the CKD EPI equation. This calculation has not been validated in all clinical situations. eGFR's persistently <60 mL/min signify possible Chronic Kidney Disease.    Anion gap 6 5 - 15  Glucose, capillary     Status: Abnormal   Collection Time: 01/23/16  9:11 PM  Result Value Ref Range   Glucose-Capillary 185 (H) 65 - 99 mg/dL   Comment 1 Notify RN   Basic metabolic panel     Status: Abnormal   Collection Time: 01/24/16  6:32 AM  Result Value Ref Range   Sodium 138 135 - 145 mmol/L   Potassium 4.5 3.5 - 5.1 mmol/L   Chloride 104 101 - 111 mmol/L   CO2 27 22 - 32 mmol/L   Glucose, Bld 120 (H) 65 - 99 mg/dL   BUN 15 6 - 20 mg/dL   Creatinine, Ser 0.70 0.44 - 1.00 mg/dL   Calcium 8.9 8.9 - 10.3 mg/dL   GFR calc non Af Amer >60 >60 mL/min   GFR calc Af Amer >60 >60 mL/min    Comment: (NOTE) The eGFR has been calculated using the CKD EPI equation. This calculation has not been validated in all clinical situations. eGFR's persistently <60 mL/min signify possible Chronic Kidney Disease.    Anion gap 7 5 - 15  CBC     Status: Abnormal   Collection Time: 01/24/16  6:32 AM  Result Value Ref Range   WBC 4.2 3.6 - 11.0 K/uL   RBC 3.51 (L) 3.80 - 5.20 MIL/uL   Hemoglobin 11.0 (L) 12.0 - 16.0 g/dL   HCT 33.0 (L) 35.0 - 47.0 %   MCV 94.0 80.0 - 100.0 fL   MCH 31.4 26.0 - 34.0 pg   MCHC 33.5 32.0 - 36.0 g/dL   RDW 13.2 11.5 - 14.5 %   Platelets 244 150 - 440 K/uL  Glucose, capillary     Status: Abnormal    Collection Time: 01/24/16  7:52 AM  Result Value Ref Range   Glucose-Capillary 107 (H) 65 - 99 mg/dL  Glucose, capillary     Status: Abnormal   Collection Time: 01/24/16 10:15 AM  Result Value Ref Range   Glucose-Capillary 114 (H) 65 - 99 mg/dL  Glucose, capillary     Status: Abnormal   Collection Time: 01/24/16 11:30 AM  Result Value Ref Range   Glucose-Capillary 119 (H) 65 - 99 mg/dL   No components found for: ESR, C REACTIVE PROTEIN MICRO: No results found for this or any previous visit (from the past 720 hour(s)).  IMAGING: No results found.  Assessment:   Jessica Donovan is a 70 y.o. female with DM admitted with L 2nd toe infection and exposed bone. Now s/p resection 2/2.  Wound cx are pending Recommendations Continue local care Does not seem like she would need IV abx at this time. Would treat based on culture results but if ready for dc prior to cx results would dc on cipro and doxy for a 10 day post op course. Fu podiatry post op and I  can see if infection recurs  Thank you very much for allowing me to participate in the care of this patient. Please call with questions.   Cheral Marker. Ola Spurr, MD

## 2016-01-24 NOTE — Interval H&P Note (Signed)
History and Physical Interval Note:  01/24/2016 8:46 AM  Jessica Donovan  has presented today for surgery, with the diagnosis of Osteomyelitis, cellulitis left foot  The various methods of treatment have been discussed with the patient and family. After consideration of risks, benefits and other options for treatment, the patient has consented to  Procedure(s): AMPUTATION TOE (2nd mpj) (Left) as a surgical intervention .  The patient's history has been reviewed, patient examined, no change in status, stable for surgery.  I have reviewed the patient's chart and labs.  Questions were answered to the patient's satisfaction.     Adaleen Hulgan W.

## 2016-01-24 NOTE — Progress Notes (Signed)
[facility] - [place] at [facility]   PATIENT NAME: Jessica Donovan    MR#:  [id number]  DATE OF BIRTH:  [birth year]  SUBJECTIVE:  CHIEF COMPLAINT:  No chief complaint on file.  Had surgery earlier today for left foot osetoemyelitis. No pain  REVIEW OF SYSTEMS:    Review of Systems  Constitutional: Positive for malaise/fatigue. Negative for fever and chills.  HENT: Negative for sore throat.   Eyes: Negative for blurred vision, double vision and pain.  Respiratory: Negative for cough, hemoptysis, shortness of breath and wheezing.   Cardiovascular: Negative for chest pain, palpitations, orthopnea and leg swelling.  Gastrointestinal: Negative for heartburn, nausea, vomiting, abdominal pain, diarrhea and constipation.  Genitourinary: Negative for dysuria and hematuria.  Musculoskeletal: Negative for back pain and joint pain.  Skin: Negative for rash.  Neurological: Positive for weakness. Negative for sensory change, speech change, focal weakness and headaches.  Endo/Heme/Allergies: Does not bruise/bleed easily.  Psychiatric/Behavioral: Negative for depression. The patient is not nervous/anxious.       DRUG ALLERGIES:   Allergies  Allergen Reactions  . Codeine Nausea And Vomiting  . Influenza Vaccines Other (See Comments)    Passed out  . Methadone Hives  . Percocet [Oxycodone-Acetaminophen] Nausea And Vomiting  . Tetanus Toxoids Swelling  . Tetracyclines & Related Rash    VITALS:  Blood pressure 134/51, pulse 79, temperature 98 F (36.7 C), temperature source Oral, resp. rate 20, height  (1.727 m), weight 72.122 kg (159 lb), SpO2 98 %.  PHYSICAL EXAMINATION:   Physical Exam  GENERAL:  70 y.o.-year-old patient lying in the bed with no acute distress.  EYES: Pupils equal, round, reactive to light and accommodation. No scleral icterus. Extraocular muscles intact.  HEENT: Head atraumatic, normocephalic. Oropharynx and nasopharynx clear.   NECK:  Supple, no jugular venous distention. No thyroid enlargement, no tenderness.  LUNGS: Normal breath sounds bilaterally, no wheezing, rales, rhonchi. No use of accessory muscles of respiration.  CARDIOVASCULAR: S1, S2 normal. No murmurs, rubs, or gallops.  ABDOMEN: Soft, nontender, nondistended. Bowel sounds present. No organomegaly or mass.  EXTREMITIES: No cyanosis, clubbing or edema b/l.   Dressing left foot NEUROLOGIC:moves all 4 extremitites PSYCHIATRIC: The patient is drowzy SKIN: No obvious rash, lesion, or ulcer.    LABORATORY PANEL:   CBC  Recent Labs Lab 01/24/16 0632  WBC 4.2  HGB 11.0*  HCT 33.0*  PLT 244   ------------------------------------------------------------------------------------------------------------------  Chemistries   Recent Labs Lab 01/24/16 0632  NA 138  K 4.5  CL 104  CO2 27  GLUCOSE 120*  BUN 15  CREATININE 0.70  CALCIUM 8.9   ------------------------------------------------------------------------------------------------------------------  Cardiac Enzymes No results for input(s): TROPONINI in the last 168 hours. ------------------------------------------------------------------------------------------------------------------  RADIOLOGY:  No results found.   ASSESSMENT AND PLAN:   * Left second toe osteomyelitis S/p amputation On IV abx. Change to PO at discharge ID consulted Pain control  * DM SSI, ADA  * HTN Continue home meds  * DVT prophylaxis Lovenox   All the records are reviewed and case discussed with Care Management/Social Workerr. Management plans discussed with the patient, family and they are in agreement.  CODE STATUS: DNR  DVT Prophylaxis: SCDs  TOTAL TIME TAKING CARE OF THIS PATIENT: 25 minutes.   POSSIBLE D/C IN 1-2 DAYS, DEPENDING ON CLINICAL CONDITION.   Milagros Loll R M.D on 01/24/2016 at 3:53 PM  Between 7am to 6pm - Pager - [phone]  After 6pm go to [email or web address] -  password  [facility]  [place] [facility]  Office  [phone]  CC: Primary care physician; Geoffery Lyons., PA    Note: This dictation was prepared with Dragon dictation along with smaller phrase technology. Any transcriptional errors that result from this process are unintentional.

## 2016-01-24 NOTE — Anesthesia Postprocedure Evaluation (Signed)
Anesthesia Post Note  Patient: Jessica Donovan  Procedure(s) Performed: Procedure(s) (LRB): AMPUTATION TOE (2nd mpj) (Left)  Patient location during evaluation: PACU Anesthesia Type: MAC Level of consciousness: lethargic Pain management: pain level controlled Vital Signs Assessment: post-procedure vital signs reviewed and stable Respiratory status: spontaneous breathing Cardiovascular status: blood pressure returned to baseline Postop Assessment: no headache Anesthetic complications: no    Last Vitals:  Filed Vitals:   01/24/16 0525 01/24/16 0901  BP: 109/45 113/50  Pulse: 73 70  Temp:  35.5 C  Resp:  16    Last Pain:  Filed Vitals:   01/24/16 0903  PainSc: 0-No pain                 Harmon Bommarito M

## 2016-01-24 NOTE — Progress Notes (Signed)
Pharmacy Antibiotic Note  Jessica Donovan is a 70 y.o. female admitted on 01/23/2016 with osteomyelitis.  Pharmacy has been consulted for Vancomycin dosing.  Plan: Vancomycin 1000 IV every 12 hours.  Goal trough 15-20 mcg/mL.   Ke: 0.063, t1/2: 11 h, VD: 50.4 L  Height:  (172.7 cm) Weight: 159 lb (72.122 kg) IBW/kg (Calculated) : 63.9  Temp (24hrs), Avg:98.1 F (36.7 C), Min:97.5 F (36.4 C), Max:98.5 F (36.9 C)   Recent Labs Lab 01/23/16 1629 01/23/16 1649 01/24/16 0632  WBC 7.5  --  4.2  CREATININE  --  0.87 0.70    Estimated Creatinine Clearance: 67 mL/min (by C-G formula based on Cr of 0.7).    Allergies  Allergen Reactions  . Codeine Nausea And Vomiting  . Influenza Vaccines Other (See Comments)    Passed out  . Methadone Hives  . Percocet [Oxycodone-Acetaminophen] Nausea And Vomiting  . Tetanus Toxoids Swelling  . Tetracyclines & Related Rash    Antimicrobials this admission: Anti-infectives    Start     Dose/Rate Route Frequency Ordered Stop   01/23/16 2300  vancomycin (VANCOCIN) IVPB 1000 mg/200 mL premix     1,000 mg 200 mL/hr over 60 Minutes Intravenous Every 12 hours 01/23/16 2109     01/23/16 1830  Ampicillin-Sulbactam (UNASYN) 3 g in sodium chloride 0.9 % 100 mL IVPB     3 g 100 mL/hr over 60 Minutes Intravenous Every 8 hours 01/23/16 1821     01/23/16 1630  vancomycin (VANCOCIN) IVPB 1000 mg/200 mL premix     1,000 mg 200 mL/hr over 60 Minutes Intravenous  Once 01/23/16 1625 01/23/16 1746      Dose adjustments this admission: Continue Vancomycin 1 gm IV q12h.  Trough ordered for 2/5 at 1030.  Microbiology results: Results for orders placed or performed during the hospital encounter of 11/07/15  Surgical pcr screen     Status: Abnormal   Collection Time: 11/07/15  6:44 PM  Result Value Ref Range Status   MRSA, PCR POSITIVE (A) NEGATIVE Final    Comment: CRITICAL RESULT CALLED TO, READ BACK BY AND VERIFIED WITH: MATT PAGE AT 2058 11/07/15  MLZ     Staphylococcus aureus POSITIVE (A) NEGATIVE Final    Comment:        The Xpert SA Assay (FDA approved for NASAL specimens in patients over 62 years of age), is one component of a comprehensive surveillance program.  Test performance has been validated by [facility] for patients greater than or equal to 54 year old. It is not intended to diagnose infection nor to guide or monitor treatment.   Anaerobic culture     Status: None   Collection Time: 11/10/15  8:15 AM  Result Value Ref Range Status   Specimen Description WOUND  Final   Special Requests NONE  Final   Culture NO ANAEROBES ISOLATED  Final   Report Status 11/14/2015 FINAL  Final  Wound culture     Status: None   Collection Time: 11/10/15  8:15 AM  Result Value Ref Range Status   Specimen Description WOUND  Final   Special Requests NONE  Final   Gram Stain FEW WBC SEEN RARE GRAM NEGATIVE RODS   Final   Culture LIGHT GROWTH STAPHYLOCOCCUS AUREUS  Final   Report Status 11/13/2015 FINAL  Final   Organism ID, Bacteria STAPHYLOCOCCUS AUREUS  Final      Susceptibility   Staphylococcus aureus - MIC*    CIPROFLOXACIN >=8 RESISTANT Resistant  ERYTHROMYCIN 4 RESISTANT Resistant     GENTAMICIN <=0.5 SENSITIVE Sensitive     OXACILLIN 0.5 SENSITIVE Sensitive     TRIMETH/SULFA <=10 SENSITIVE Sensitive     CLINDAMYCIN <=0.25 RESISTANT Resistant     CEFOXITIN SCREEN NEGATIVE Sensitive     Inducible Clindamycin Value in next row Resistant      POSITIVEINDUCIBLE CLINDAMYCIN RESISTANCE - A positive ICR test is indicative of inducible resistance to macrolides, lincosamides, and type B streptogramin.  This isolate is presumed to be resistant to Clindamycin, however, Clindamycin may still be effective in some patients.     TETRACYCLINE Value in next row Sensitive      SENSITIVE<=1    * LIGHT GROWTH STAPHYLOCOCCUS AUREUS    Thank you for allowing pharmacy to be a part of this patient's care.  Arseniy Toomey  G 01/24/2016 8:34 AM

## 2016-01-24 NOTE — H&P (View-Only) (Signed)
Reason for Consult: Osteomyelitis of the left second toe Referring Physician: [facility]  Jessica Donovan is an 70 y.o. female.  HPI: The patient has a history of poorly controlled diabetes with associated neuropathy. 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 A couple of weeks ago she had an injury where she fell off of a chair and injured the left second toe. Recently a few days ago started to notice some bleeding from the toe. She was seen outpatient in the office with exposed bone. Decision was made to stop her blood thinners and admit for IV antibiotics and amputation of the second toe.  Past Medical History  Diagnosis Date  . Hypertension   . Diabetes mellitus without complication ([place])   . Coronary artery disease   . Low back pain   . Stroke ([place])   . Myocardial infarction ([place])   . Chronic back pain     Past Surgical History  Procedure Laterality Date  . Toe amputation    . Amputation toe Left 11/10/2015    Procedure: AMPUTATION TOE;  Surgeon: Sharlotte Alamo, MD;  Location: [facility] ORS;  Service: Podiatry;  Laterality: Left;  . Cardiac catheterization N/A 11/12/2015    Procedure: Left Heart Cath and Coronary Angiography;  Surgeon: Isaias Cowman, MD;  Location: [place] [facility];  Service: Cardiovascular;  Laterality: N/A;    Family History  Problem Relation Age of Onset  . CAD Mother   . CAD Father     Social History:  reports that she has never smoked. She does not have any smokeless tobacco history on file. She reports that she does not drink alcohol or use illicit drugs.  Allergies:  Allergies  Allergen Reactions  . Codeine Nausea And Vomiting  . Influenza Vaccines Other (See Comments)    Passed out  . Methadone Hives  . Percocet [Oxycodone-Acetaminophen] Nausea And Vomiting  . Tetanus Toxoids Swelling  . Tetracyclines & Related Rash    Medications:  Scheduled: . ampicillin-sulbactam (UNASYN) IV  3 g Intravenous Q8H  . insulin aspart   0-5 Units Subcutaneous QHS  . insulin aspart  0-9 Units Subcutaneous TID WC  . morphine  30 mg Oral Q12H  . sodium chloride flush  3 mL Intravenous Q12H  . vancomycin  1,000 mg Intravenous Q12H    Results for orders placed or performed during the hospital encounter of 01/23/16 (from the past 48 hour(s))  CBC WITH DIFFERENTIAL     Status: Abnormal   Collection Time: 01/23/16  4:29 PM  Result Value Ref Range   WBC 7.5 3.6 - 11.0 K/uL   RBC 3.84 3.80 - 5.20 MIL/uL   Hemoglobin 11.9 (L) 12.0 - 16.0 g/dL   HCT 35.1 35.0 - 47.0 %   MCV 91.5 80.0 - 100.0 fL   MCH 30.9 26.0 - 34.0 pg   MCHC 33.8 32.0 - 36.0 g/dL   RDW 13.2 11.5 - 14.5 %   Platelets 296 150 - 440 K/uL   Neutrophils Relative % 62 %   Neutro Abs 4.6 1.4 - 6.5 K/uL   Lymphocytes Relative 27 %   Lymphs Abs 2.0 1.0 - 3.6 K/uL   Monocytes Relative 9 %   Monocytes Absolute 0.7 0.2 - 0.9 K/uL   Eosinophils Relative 2 %   Eosinophils Absolute 0.1 0 - 0.7 K/uL   Basophils Relative 0 %   Basophils Absolute 0.0 0 - 0.1 K/uL  APTT     Status: None   Collection Time: 01/23/16  4:29 PM  Result Value Ref Range   aPTT 29 24 - 36 seconds  Protime-INR     Status: None   Collection Time: 01/23/16  4:29 PM  Result Value Ref Range   Prothrombin Time 13.7 11.4 - 15.0 seconds   INR 1.03   Hemoglobin A1c     Status: Abnormal   Collection Time: 01/23/16  4:29 PM  Result Value Ref Range   Hgb A1c MFr Bld 7.0 (H) 4.0 - 6.0 %  Glucose, capillary     Status: Abnormal   Collection Time: 01/23/16  4:35 PM  Result Value Ref Range   Glucose-Capillary 138 (H) 65 - 99 mg/dL  Basic metabolic panel     Status: Abnormal   Collection Time: 01/23/16  4:49 PM  Result Value Ref Range   Sodium 132 (L) 135 - 145 mmol/L   Potassium 4.9 3.5 - 5.1 mmol/L   Chloride 103 101 - 111 mmol/L   CO2 23 22 - 32 mmol/L   Glucose, Bld 134 (H) 65 - 99 mg/dL   BUN 21 (H) 6 - 20 mg/dL   Creatinine, Ser 0.87 0.44 - 1.00 mg/dL   Calcium 9.0 8.9 - 10.3 mg/dL   GFR  calc non Af Amer >60 >60 mL/min   GFR calc Af Amer >60 >60 mL/min    Comment: (NOTE) The eGFR has been calculated using the CKD EPI equation. This calculation has not been validated in all clinical situations. eGFR's persistently <60 mL/min signify possible Chronic Kidney Disease.    Anion gap 6 5 - 15  Glucose, capillary     Status: Abnormal   Collection Time: 01/23/16  9:11 PM  Result Value Ref Range   Glucose-Capillary 185 (H) 65 - 99 mg/dL   Comment 1 Notify RN     No results found.  Review of Systems  Constitutional: Negative.   HENT: Negative.   Eyes: Negative.   Respiratory: Negative.   Cardiovascular: Negative.   Gastrointestinal: Negative.   Genitourinary: Negative.   Skin:       Ulceration on her left second toe with some recent bleeding and drainage.  Neurological:       Significant neuropathy related to her diabetes  Endo/Heme/Allergies: Negative.   Psychiatric/Behavioral: Negative.    Blood pressure 118/49, pulse 82, temperature 97.5 F (36.4 C), temperature source Oral, resp. rate 18, height _0  (1.727 m), weight 72.122 kg (159 lb), SpO2 95 %. Physical Exam  Cardiovascular:  DP and PT pulses are palpable.  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 Digital contractures are noted bilateral.  Neurological:  Loss of protective sensation bilateral  Skin:  The skin is warm dry and supple. Full thickness ulceration with exposed bone at the distal aspect of the left second toe. Scabbed over abrasion on the left fifth toe. Also a dry scab with minimal surrounding erythema on the dorsal right midfoot.    Assessment/Plan: Assessment: 1. Osteomyelitis left second toe. 2. Diabetes with associated neuropathy.  Plan: Discussed with the patient the need for amputation of the second toe. Discussed possible risks and complications including continued infection or inability to heal the wound requiring further amputation. Questions  invited and answered. We will obtain a consent form for amputation of the left second toe. The patient will be nothing by mouth after midnight. Plan for surgery tomorrow morning.  Charlett Merkle W. 01/23/2016, 9:23 PM

## 2016-01-24 NOTE — Care Management (Addendum)
Admitted to [facility] with the diagnosis of osteomyelitis. Lives alone. Friend is Gaetano Net [phone]). Last seen Esperanza Sheets PA at [facility] a couple of weeks ago. Home Health has been thru [facility] [phone]). Skilled nursing thru [facility] November-December 2016 x 10 days. States she was a patient here and was transferred to [facility] so she could be around friends. States that she is now living in this area. No home oxygen. Uses a cane to aid in ambulation. Takes care of all activities of daily living herself. Doesn't drive. States friends help with errands. Last fall was 3 weeks ago. Decreased appetite over time. States she has ordered [employer] [facility].  States she wants to go to rehabilitation when stable.  On bedrest. Will need physical therapy evaluation prior to discharge. Gwenette Greet RN MSN CCM Care Management [phone]

## 2016-01-24 NOTE — Anesthesia Postprocedure Evaluation (Signed)
Anesthesia Post Note  Patient: Jessica Donovan  Procedure(s) Performed: Procedure(s) (LRB): AMPUTATION TOE (2nd mpj) (Left)  Patient location during evaluation: PACU Anesthesia Type: MAC Level of consciousness: lethargic (Just as she was pre-op.) Pain management: pain level controlled Vital Signs Assessment: post-procedure vital signs reviewed and stable Respiratory status: spontaneous breathing Cardiovascular status: blood pressure returned to baseline Postop Assessment: no headache Anesthetic complications: no    Last Vitals:  Filed Vitals:   01/24/16 1011 01/24/16 1012  BP: 100/55 100/55  Pulse: 70 70  Temp: 36.6 C 36.6 C  Resp: 15 17    Last Pain:  Filed Vitals:   01/24/16 1016  PainSc: 0-No pain                 Ellarie Picking M

## 2016-01-24 NOTE — Progress Notes (Signed)
Initial Nutrition Assessment   INTERVENTION:   Meals and Snacks: Cater to patient preferences Medical Food Supplement Therapy: will recommend Glucerna Shake po TID, each supplement provides 220 kcal and 10 grams of protein Coordination of Care: will recommend collecting daily weights   NUTRITION DIAGNOSIS:   Inadequate oral intake related to chronic illness, poor appetite as evidenced by per patient/family report (dietitian consult).  GOAL:   Patient will meet greater than or equal to 90% of their needs  MONITOR:    (Energy Intake, Electrolyte and REnal Profile, Glucose Profile, Anthropometrics,)  REASON FOR ASSESSMENT:   Consult Poor PO  ASSESSMENT:   Pt admitted with osteomyelitis of left second toe, s/p amputation 01/24/2016. Pt currently on isolation. Pt out of room this am at procedure and unavailable this afternoon on visit.  Past Medical History  Diagnosis Date  . Hypertension   . Diabetes mellitus without complication ([facility])   . Coronary artery disease   . Low back pain   . Stroke ([facility])   . Myocardial infarction ([facility])   . Chronic back pain      Diet Order:  Diet Carb Modified Fluid consistency:: Thin; Room service appropriate?: Yes   Current Nutrition: Pt NPO this am, just advanced this afternoon.   Food/Nutrition-Related History: Per MST and consult pt with poor po intake PTA. Unable to clarify with pt at this time.   Scheduled Medications:  . ampicillin-sulbactam (UNASYN) IV  3 g Intravenous Q8H  . aspirin EC  81 mg Oral Daily  . atorvastatin  20 mg Oral Daily  . feeding supplement (GLUCERNA SHAKE)  237 mL Oral TID WC  . insulin aspart  0-5 Units Subcutaneous QHS  . insulin aspart  0-9 Units Subcutaneous TID WC  . liothyronine  25 mcg Oral Daily  . mirtazapine  15 mg Oral QHS  . morphine  30 mg Oral Q12H  . pantoprazole  40 mg Oral QAC breakfast  . pregabalin  75 mg Oral BID  . sodium chloride flush  3 mL Intravenous Q12H  . sodium chloride flush   3 mL Intravenous Q12H  . vancomycin  1,000 mg Intravenous Q12H  . venlafaxine XR  150 mg Oral Q breakfast    Continuous Medications:  . sodium chloride 50 mL/hr at 01/24/16 1221     Electrolyte/Renal Profile and Glucose Profile:   Recent Labs Lab 01/23/16 1649 01/24/16 0632  NA 132* 138  K 4.9 4.5  CL 103 104  CO2 23 27  BUN 21* 15  CREATININE 0.87 0.70  CALCIUM 9.0 8.9  GLUCOSE 134* 120*   Protein Profile: No results for input(s): ALBUMIN in the last 168 hours.  Gastrointestinal Profile: Last BM:  01/20/2016   Nutrition-Focused Physical Exam Findings:  Unable to complete Nutrition-Focused physical exam at this time.    Weight Change: Per [facility] weight encounters weight loss however weight gain in 2 months.   Height:   Ht Readings from Last 1 Encounters:  01/23/16  (1.727 m)    Weight:   Wt Readings from Last 1 Encounters:  01/23/16 159 lb (72.122 kg)   Wt Readings from Last 10 Encounters:  01/23/16 159 lb (72.122 kg)  12/07/15 150 lb (68.04 kg)  11/29/15 170 lb (77.111 kg)  11/12/15 175 lb (79.379 kg)     BMI:  Body mass index is 24.18 kg/(m^2).  Estimated Nutritional Needs:   Kcal:  BEE: 1293kcals, TEE (IF 1.1-1.3)(AF 1.2) 1707-2017kcals  Protein:  79-93g protein (1.1-1.3g/kg)  Fluid:  1800-214mL  of fluid (25-71mLk/g)  EDUCATION NEEDS:   No education needs identified at this time   MODERATE Care Level  Leda Quail, RD, LDN Pager [phone] Weekend/On-Call Pager [phone]

## 2016-01-24 NOTE — OR Nursing (Signed)
Unable to review patient's medication list due to patient being too sleepy. Please see MAR for medicines given while at the hospital.

## 2016-01-24 NOTE — Progress Notes (Signed)
MD notified patient unhappy with care in regards to her home medications not being ordered by the admitting MD. Dr. Anne Hahn ordered all medications patient requested.

## 2016-01-24 NOTE — Op Note (Signed)
Date of operation: 01/24/2016.  Surgeon: Ricci Barker DPM.  Preoperative diagnosis: Osteomyelitis left second toe.  Postoperative diagnosis: Same.  Procedure: Amputation left second toe.  Anesthesia: Local Mac.  Hemostasis: Pneumatic tourniquet left ankle 250 mmHg.  Estimated blood loss: Less than 5 cc.  Pathology: Left second toe.  Cultures bone cultures left second toe.  Complications: None apparent.  Operative indications: This is a 70 year old female with recent development of an ulceration on the tip of her left second toe after an injury. The ulceration extended down to the level of visible bone and decision was made for amputation of the left second toe with hospitalization.  Operative procedures: Patient was taken the operating room and placed on the table in the supine position. Following satisfactory sedation the left foot was anesthetized with 9 cc of 0.5% Sensorcaine plain around the second metatarsal region. A pneumatic tourniquet was applied at the level of the left ankle and the foot was prepped and draped in the usual sterile fashion. Foot was exsanguinated and the tourniquet inflated to 250 mmHg. Attention was then directed to the distal aspect of the left foot where an elliptical incision was made around the base of the second toe both medial and lateral. The incision was carried sharply down to the level of the bone and dissection carried back to the metatarsophalangeal joint where the toe was disarticulated and removed in toto. Good healthy tissues were noted. Wound was then flushed with copious amounts of sterile saline and closed using 4-0 nylon vertical mattress and simple interrupted sutures. Xeroform 4 x 4's conformer and a Kerlix were applied to the left foot. The tourniquet was released and blood flow noted to return to the left foot. An Ace wrap was applied on top of the bandaging. Patient tolerated the procedure and anesthesia well and was transported to the PACU  with vital signs stable and in good condition.

## 2016-01-24 NOTE — Anesthesia Preprocedure Evaluation (Addendum)
Anesthesia Evaluation  Patient identified by MRN, date of birth, ID band Patient confused    Reviewed: Allergy & Precautions, NPO status , Patient's Chart, lab work & pertinent test results, Unable to perform ROS - Chart review only  Airway Mallampati: III  TM Distance: >3 FB Neck ROM: Limited    Dental  (+) Teeth Intact   Pulmonary    + rhonchi        Cardiovascular Exercise Tolerance: Poor hypertension, Pt. on medications + angina with exertion + CAD and + Past MI   Rhythm:Regular Rate:Normal     Neuro/Psych CVA, Residual Symptoms    GI/Hepatic   Endo/Other  diabetes, Type 2107 this morning.  Renal/GU Renal InsufficiencyRenal disease     Musculoskeletal   Abdominal (+) + obese,   Peds  Hematology   Anesthesia Other Findings   Reproductive/Obstetrics                            Anesthesia Physical Anesthesia Plan  ASA: III  Anesthesia Plan: MAC   Post-op Pain Management:    Induction: Intravenous  Airway Management Planned: Simple Face Mask  Additional Equipment:   Intra-op Plan:   Post-operative Plan:   Informed Consent: I have reviewed the patients History and Physical, chart, labs and discussed the procedure including the risks, benefits and alternatives for the proposed anesthesia with the patient or authorized representative who has indicated his/her understanding and acceptance.     Plan Discussed with: CRNA  Anesthesia Plan Comments:         Anesthesia Quick Evaluation

## 2016-01-25 DIAGNOSIS — I10 Essential (primary) hypertension: Secondary | ICD-10-CM | POA: Diagnosis not present

## 2016-01-25 DIAGNOSIS — R531 Weakness: Secondary | ICD-10-CM | POA: Diagnosis not present

## 2016-01-25 DIAGNOSIS — M869 Osteomyelitis, unspecified: Secondary | ICD-10-CM | POA: Diagnosis not present

## 2016-01-25 DIAGNOSIS — E118 Type 2 diabetes mellitus with unspecified complications: Secondary | ICD-10-CM | POA: Diagnosis not present

## 2016-01-25 LAB — GLUCOSE, CAPILLARY
GLUCOSE-CAPILLARY: 115 mg/dL — AB (ref 65–99)
GLUCOSE-CAPILLARY: 192 mg/dL — AB (ref 65–99)
GLUCOSE-CAPILLARY: 155 mg/dL — AB (ref 65–99)
GLUCOSE-CAPILLARY: 164 mg/dL — AB (ref 65–99)
Glucose-Capillary: 183 mg/dL — ABNORMAL HIGH (ref 65–99)

## 2016-01-25 MED ORDER — CHLORHEXIDINE GLUCONATE CLOTH 2 % EX PADS
6.0000 | MEDICATED_PAD | Freq: Every day | CUTANEOUS | Status: DC
  Administered 2016-01-25 – 2016-01-26 (×2): 6 via TOPICAL

## 2016-01-25 MED ORDER — ALPRAZOLAM 1 MG PO TABS
1.0000 mg | ORAL_TABLET | Freq: Three times a day (TID) | ORAL | Status: DC | PRN
  Administered 2016-01-25 – 2016-01-27 (×4): 1 mg via ORAL
  Filled 2016-01-25 (×4): qty 1

## 2016-01-25 MED ORDER — ENOXAPARIN SODIUM 40 MG/0.4ML ~~LOC~~ SOLN
40.0000 mg | SUBCUTANEOUS | Status: DC
  Administered 2016-01-25 – 2016-01-26 (×2): 40 mg via SUBCUTANEOUS
  Filled 2016-01-25 (×2): qty 0.4

## 2016-01-25 MED ORDER — MUPIROCIN 2 % EX OINT
1.0000 "application " | TOPICAL_OINTMENT | Freq: Two times a day (BID) | CUTANEOUS | Status: DC
  Administered 2016-01-25 – 2016-01-27 (×6): 1 via NASAL
  Filled 2016-01-25: qty 22

## 2016-01-25 MED ORDER — ALPRAZOLAM 0.5 MG PO TABS: 0.5000 mg | ORAL_TABLET | Freq: Once | ORAL | Status: DC

## 2016-01-25 MED ORDER — CYCLOBENZAPRINE HCL 10 MG PO TABS
10.0000 mg | ORAL_TABLET | Freq: Three times a day (TID) | ORAL | Status: DC | PRN
  Administered 2016-01-25 – 2016-01-26 (×2): 10 mg via ORAL
  Filled 2016-01-25 (×2): qty 1

## 2016-01-25 NOTE — Progress Notes (Signed)
Pt s/p toe amputation. Dressing intact. No breakthrough bleeding. OK for d/c from podiatry standpoint.   Pt to f/u with Dr Alberteen Spindle next week.

## 2016-01-25 NOTE — Progress Notes (Signed)
Physical Therapy Evaluation Patient Details Name: Jessica Donovan MRN: [id number] DOB: [birth year] Today's Date: 01/25/2016   History of Present Illness  70 year old female with past medical history significant for hypertension, diabetes, coronary artery disease was sent in from podiatry office secondary to worsening left 2nd toe pain and possible osteomyelitis. Now s/p 2nd toe amptation on 01/24/2016  Clinical Impression  Pt presents with decreased functional mobility, pain, and generalized weakness from toe amputation.  Pt able to get out of bed with CGA and needs verbal instruction for hand placement and sequencing with RW.  Pt ambulates limited distances (20') with RW and CGA.  Pt educated on keeping foot elevated, therex, and safety with RW.  Pt required SBA for toileting.  Pt would benefit from additional PT services to address objective findings.    Follow Up Recommendations SNF (asking for [facility])    Equipment Recommendations  Rolling walker with 5" wheels    Recommendations for Other Services       Precautions / Restrictions Precautions Precautions: Fall Precaution Comments: high Required Braces or Orthoses:  (L Darco shoe) Restrictions Weight Bearing Restrictions: Yes Other Position/Activity Restrictions: Heel weight bearing only      Mobility  Bed Mobility Overal bed mobility: Modified Independent             General bed mobility comments: Supine to sit with HOB elevated.  Transfers Overall transfer level: Needs assistance Equipment used: Rolling walker (2 wheeled) Transfers: Sit to/from Stand Sit to Stand: Min guard         General transfer comment: verbal cues for hand placement and sequencing; instruction to maintain heel weight bearing status.  Ambulation/Gait Ambulation/Gait assistance: Min guard Ambulation Distance (Feet): 20 Feet Assistive device: Rolling walker (2 wheeled) Gait Pattern/deviations: Step-to pattern     General Gait Details:  Heel weight bearing with Draco boot donned with short steps bilaterally and moderate use of RW through UE's.  Stairs            Wheelchair Mobility    Modified Rankin (Stroke Patients Only)       Balance Overall balance assessment: Modified Independent                                           Pertinent Vitals/Pain Pain Assessment: 0-10 Pain Score: 6  Pain Location: L foot, surgical site Pain Descriptors / Indicators: Pins and needles Pain Intervention(s): Limited activity within patient's tolerance;Monitored during session    Home Living Family/patient expects to be discharged to:: Skilled nursing facility Living Arrangements: Alone               Additional Comments: about 6 steps total to enter apartment building; ramp available if needed    Prior Function Level of Independence: Independent with assistive device(s)         Comments: Recently at SNF after toe amp then home alone with aide onece a week assisting with medication organization.     Hand Dominance        Extremity/Trunk Assessment   Upper Extremity Assessment: Overall WFL for tasks assessed           Lower Extremity Assessment: Generalized weakness         Communication   Communication: No difficulties  Cognition Arousal/Alertness: Awake/alert Behavior During Therapy: WFL for tasks assessed/performed Overall Cognitive Status: Within Functional Limits for tasks assessed  General Comments General comments (skin integrity, edema, etc.): L foot with surgical bandage    Exercises Other Exercises Other Exercises: B LE therex in sitting: AP, QS, GS x 10 reps each; education on keeping foot elevated to promote circulation and healing.      Assessment/Plan    PT Assessment Patient needs continued PT services  PT Diagnosis Difficulty walking;Generalized weakness;Acute pain   PT Problem List Decreased strength;Decreased activity  tolerance;Decreased mobility;Decreased knowledge of use of DME;Pain  PT Treatment Interventions DME instruction;Gait training;Stair training;Functional mobility training;Therapeutic activities;Therapeutic exercise;Patient/family education   PT Goals (Current goals can be found in the Care Plan section) Acute Rehab PT Goals Patient Stated Goal: "To get stronger at rehab before going home." PT Goal Formulation: With patient Time For Goal Achievement: 02/08/16 Potential to Achieve Goals: Good    Frequency Min 2X/week   Barriers to discharge Decreased caregiver support lives alone; 6 steps to enter apartment building    Co-evaluation               End of Session Equipment Utilized During Treatment: Gait belt Activity Tolerance: Patient tolerated treatment well;Patient limited by pain Patient left: in chair;with bed alarm set;with call bell/phone within reach Nurse Communication: Mobility status         Time: 1240-1310 PT Time Calculation (min) (ACUTE ONLY): 30 min   Charges:   PT Evaluation $PT Eval Moderate Complexity: 1 Procedure PT Treatments $Therapeutic Exercise: 8-22 mins   PT G Codes:        Jessica Donovan 01-26-2016, 1:28 PM

## 2016-01-25 NOTE — Progress Notes (Signed)
[facility] - [place] at [facility]   PATIENT NAME: Jessica Donovan    MR#:  [id number]  DATE OF BIRTH:  06-05-1946  SUBJECTIVE:  CHIEF COMPLAINT:  No chief complaint on file.  -She had surgery for her left 2nd toe osteomyelitis yesterday. Status post amputation of the toe. -Complains of some pain. Dressing in place. Says that she couldn't sleep last night  REVIEW OF SYSTEMS:    Review of Systems  Constitutional: Positive for malaise/fatigue. Negative for fever and chills.  HENT: Negative for sore throat.   Eyes: Negative for blurred vision, double vision and pain.  Respiratory: Negative for cough, hemoptysis, shortness of breath and wheezing.   Cardiovascular: Negative for chest pain, palpitations, orthopnea and leg swelling.  Gastrointestinal: Negative for heartburn, nausea, vomiting, abdominal pain, diarrhea and constipation.  Genitourinary: Negative for dysuria and hematuria.  Musculoskeletal: Positive for joint pain. Negative for back pain.       Let foot pain  Skin: Negative for rash.  Neurological: Positive for weakness. Negative for sensory change, speech change, focal weakness and headaches.  Endo/Heme/Allergies: Does not bruise/bleed easily.  Psychiatric/Behavioral: Negative for depression. The patient is not nervous/anxious.       DRUG ALLERGIES:   Allergies  Allergen Reactions  . Codeine Nausea And Vomiting  . Influenza Vaccines Other (See Comments)    Passed out  . Methadone Hives  . Percocet [Oxycodone-Acetaminophen] Nausea And Vomiting  . Tetanus Toxoids Swelling  . Tetracyclines & Related Rash    VITALS:  Blood pressure 128/60, pulse 79, temperature 97.8 F (36.6 C), temperature source Oral, resp. rate 18, height  (1.727 m), weight 76.386 kg (168 lb 6.4 oz), SpO2 92 %.  PHYSICAL EXAMINATION:   Physical Exam  GENERAL:  70 y.o.-year-old patient lying in the bed with no acute distress.  EYES: Pupils equal, round, reactive  to light and accommodation. No scleral icterus. Extraocular muscles intact.  HEENT: Head atraumatic, normocephalic. Oropharynx and nasopharynx clear.  NECK:  Supple, no jugular venous distention. No thyroid enlargement, no tenderness.  LUNGS: Normal breath sounds bilaterally, no wheezing, rales, rhonchi. No use of accessory muscles of respiration.  CARDIOVASCULAR: S1, S2 normal. No murmurs, rubs, or gallops.  ABDOMEN: Soft, nontender, nondistended. Bowel sounds present. No organomegaly or mass.  EXTREMITIES: No cyanosis, clubbing or edema b/l.   Dressing left foot in place- s/p surgery NEUROLOGIC:moves all 4 extremitites. No focal neurological deficits. Strength and sensation intact PSYCHIATRIC: The patient is alert and oriented 3 SKIN: No obvious rash, lesion, or ulcer.    LABORATORY PANEL:   CBC  Recent Labs Lab 01/24/16 0632  WBC 4.2  HGB 11.0*  HCT 33.0*  PLT 244   ------------------------------------------------------------------------------------------------------------------  Chemistries   Recent Labs Lab 01/24/16 0632  NA 138  K 4.5  CL 104  CO2 27  GLUCOSE 120*  BUN 15  CREATININE 0.70  CALCIUM 8.9   ------------------------------------------------------------------------------------------------------------------  Cardiac Enzymes No results for input(s): TROPONINI in the last 168 hours. ------------------------------------------------------------------------------------------------------------------  RADIOLOGY:  No results found.   ASSESSMENT AND PLAN:   70 year old female with past medical history significant for hypertension, diabetes, coronary artery disease was sent in from podiatry office secondary to worsening left 2nd toe pain and possible osteomyelitis.  * Left second toe osteomyelitis and cellulitis of left foot- patient podiatry consult. Status post amputation of second metatarsophalangeal joint. -Cultures are still pending at this time.  Continue Vanco and Unasyn. -Await culture results to appropriately changed to oral antibiotics. -If  she has a bed and cultures are still pending, can discharge her on Cipro and doxycycline. -Pain control  * DM-sugars within normal limits. Patient was taking Lantus at home.  can be restarted slowly. SSI, ADA  * HTN Continue home meds  * CAD-stable. Continue aspirin and statin at this time. Taking Brilinta and Imdur at home that needs to be restarted at discharge.  *Hypothyroidism-continue home medications  * DVT prophylaxis-start Lovenox  Awaiting physical therapy consult. Patient is interested in going to rehabilitation  All the records are reviewed and case discussed with Care Management/Social Workerr. Management plans discussed with the patient, family and they are in agreement.  CODE STATUS: DNR  DVT Prophylaxis: SCDs  TOTAL TIME TAKING CARE OF THIS PATIENT: 32 minutes.   POSSIBLE D/C IN 1-2 DAYS, DEPENDING ON CLINICAL CONDITION.   Enid Baas M.D on 01/25/2016 at 9:15 AM  Between 7am to 6pm - Pager - [phone]  After 6pm go to [email or web address] - password [email or web address] [facility]  [place] [facility]  Office  [phone]  CC: Primary care physician; Geoffery Lyons., PA    Note: This dictation was prepared with Dragon dictation along with smaller phrase technology. Any transcriptional errors that result from this process are unintentional.

## 2016-01-26 DIAGNOSIS — I10 Essential (primary) hypertension: Secondary | ICD-10-CM | POA: Diagnosis not present

## 2016-01-26 DIAGNOSIS — M869 Osteomyelitis, unspecified: Secondary | ICD-10-CM | POA: Diagnosis not present

## 2016-01-26 DIAGNOSIS — R531 Weakness: Secondary | ICD-10-CM | POA: Diagnosis not present

## 2016-01-26 DIAGNOSIS — E1169 Type 2 diabetes mellitus with other specified complication: Secondary | ICD-10-CM | POA: Diagnosis not present

## 2016-01-26 DIAGNOSIS — E118 Type 2 diabetes mellitus with unspecified complications: Secondary | ICD-10-CM | POA: Diagnosis not present

## 2016-01-26 LAB — BASIC METABOLIC PANEL
ANION GAP: 8 (ref 5–15)
BUN: 12 mg/dL (ref 6–20)
CALCIUM: 8.8 mg/dL — AB (ref 8.9–10.3)
CO2: 27 mmol/L (ref 22–32)
CREATININE: 0.53 mg/dL (ref 0.44–1.00)
Chloride: 108 mmol/L (ref 101–111)
GFR calc Af Amer: >60 mL/min (ref >60)
GLUCOSE: 115 mg/dL — AB (ref 65–99)
POTASSIUM: 3.9 mmol/L (ref 3.5–5.1)
Sodium: 143 mmol/L (ref 135–145)

## 2016-01-26 LAB — GLUCOSE, CAPILLARY
GLUCOSE-CAPILLARY: 157 mg/dL — AB (ref 65–99)
Glucose-Capillary: 104 mg/dL — ABNORMAL HIGH (ref 65–99)
Glucose-Capillary: 102 mg/dL — ABNORMAL HIGH (ref 65–99)
Glucose-Capillary: 138 mg/dL — ABNORMAL HIGH (ref 65–99)

## 2016-01-26 LAB — VANCOMYCIN, TROUGH: Vancomycin Tr: 13 ug/mL (ref 10–20)

## 2016-01-26 MED ORDER — MORPHINE SULFATE ER 30 MG PO TBCR
45.0000 mg | EXTENDED_RELEASE_TABLET | Freq: Two times a day (BID) | ORAL | Status: DC
  Administered 2016-01-26 – 2016-01-27 (×2): 45 mg via ORAL
  Filled 2016-01-26 (×2): qty 1

## 2016-01-26 MED ORDER — MORPHINE SULFATE (PF) 4 MG/ML IV SOLN
4.0000 mg | INTRAVENOUS | Status: DC | PRN
  Administered 2016-01-26 – 2016-01-27 (×3): 4 mg via INTRAVENOUS
  Filled 2016-01-26 (×3): qty 1

## 2016-01-26 MED ORDER — VANCOMYCIN HCL 10 G IV SOLR
1250.0000 mg | Freq: Two times a day (BID) | INTRAVENOUS | Status: DC
  Filled 2016-01-26 (×2): qty 1250

## 2016-01-26 NOTE — Progress Notes (Signed)
ID E Note Reviewed culture results with MSSA. Gram stain also showed gram neg rods but none grown. Likely polymicrobial infection.  Would dc when stable on Augmentin 875 bid - for a 10 day total abx course post op. She can follow up with podiatry and this can be extended if needed. I can see in follow up if podiatry requests but routine fu with me not needed since not on IV abx.

## 2016-01-26 NOTE — NC FL2 (Signed)
[place] [facility]2 LEVEL OF CARE SCREENING TOOL     IDENTIFICATION  Patient Name: Jessica Donovan Birthdate: [birth year] Sex: female Admission Date (Current Location): 01/23/2016  [place] and [place] Number:  Chiropodist and Address:  [facility], [address] [place], [place] [address]      Provider Number: [id number]  Attending Physician Name and Address:  Enid Baas, MD  Relative Name and Phone Number:       Current Level of Care: Hospital Recommended Level of Care: Skilled Nursing Facility Prior Approval Number:    Date Approved/Denied:   PASRR Number: [id number]  Discharge Plan: SNF    Current Diagnoses: Patient Active Problem List   Diagnosis Date Noted  . Osteomyelitis of toe (HCC) 01/23/2016  . Rhabdomyolysis 12/07/2015  . ARF (acute renal failure) (HCC) 11/29/2015  . Hypotension 11/29/2015  . 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 Angina effort (HCC) 11/07/2015  . Osteomyelitis (HCC) 11/07/2015    Orientation RESPIRATION BLADDER Height & Weight     Self, Time, Situation, Place  Normal Continent Weight: 163 lb 1.6 oz (73.982 kg) Height:   (172.7 cm)  BEHAVIORAL SYMPTOMS/MOOD NEUROLOGICAL BOWEL NUTRITION STATUS      Continent Diet (Carb modified)  AMBULATORY STATUS COMMUNICATION OF NEEDS Skin   Extensive Assist Verbally Normal                       Personal Care Assistance Level of Assistance  Bathing, Dressing Bathing Assistance: Limited assistance   Dressing Assistance: Limited assistance     Functional Limitations Info  Sight, Hearing, Speech Sight Info: Adequate Hearing Info: Adequate Speech Info: Adequate    SPECIAL CARE FACTORS FREQUENCY  PT (By licensed PT)     PT Frequency: 5x Week              Contractures      Additional Factors Info  Allergies, Code Status, Isolation Precautions Code Status Info: DNR Allergies Info: Codeine, Influenza Vaccines, Methadone,  Percocet, Tetanus Toxoids, Tetracyclines & Related     Isolation Precautions Info: MRSA     Current Medications (01/26/2016):  This is the current hospital active medication list Current Facility-Administered Medications  Medication Dose Route Frequency Provider Last Rate Last Dose  . 0.9 %  sodium chloride infusion  250 mL Intravenous PRN Shaune Pollack, MD      . 0.9 %  sodium chloride infusion   Intravenous Continuous Amy Rice, MD 50 mL/hr at 01/26/16 1241    . 0.9 %  sodium chloride infusion  250 mL Intravenous PRN Linus Galas, DPM      . acetaminophen (TYLENOL) tablet 650 mg  650 mg Oral Q6H PRN Shaune Pollack, MD   650 mg at 01/26/16 1502   Or  . acetaminophen (TYLENOL) suppository 650 mg  650 mg Rectal Q6H PRN Shaune Pollack, MD      . albuterol (PROVENTIL) (2.5 MG/3ML) 0.083% nebulizer solution 2.5 mg  2.5 mg Nebulization Q2H PRN Shaune Pollack, MD      . ALPRAZolam Prudy Feeler) tablet 1 mg  1 mg Oral TID PRN Enid Baas, MD   1 mg at 01/26/16 1503  . Ampicillin-Sulbactam (UNASYN) 3 g in sodium chloride 0.9 % 100 mL IVPB  3 g Intravenous Q6H Srikar Sudini, MD   3 g at 01/26/16 1456  . aspirin EC tablet 81 mg  81 mg Oral Daily Oralia Manis, MD   81 mg at 01/26/16 0940  . atorvastatin (LIPITOR)  tablet 20 mg  20 mg Oral Daily Oralia Manis, MD   20 mg at 01/26/16 0940  . Chlorhexidine Gluconate Cloth 2 % PADS 6 each  6 each Topical Q0600 Enid Baas, MD   6 each at 01/26/16 [phone]  . cyclobenzaprine (FLEXERIL) tablet 10 mg  10 mg Oral TID PRN Enid Baas, MD   10 mg at 01/25/16 1356  . darifenacin (ENABLEX) 24 hr tablet 15 mg  15 mg Oral Daily Milagros Loll, MD   15 mg at 01/26/16 0940  . enoxaparin (LOVENOX) injection 40 mg  40 mg Subcutaneous Q24H Enid Baas, MD   40 mg at 01/25/16 2244  . feeding supplement (GLUCERNA SHAKE) (GLUCERNA SHAKE) liquid 237 mL  237 mL Oral TID WC Srikar Sudini, MD   237 mL at 01/26/16 1200  . insulin aspart (novoLOG) injection 0-5 Units  0-5 Units Subcutaneous  QHS Shaune Pollack, MD   0 Units at 01/23/16 2131  . insulin aspart (novoLOG) injection 0-9 Units  0-9 Units Subcutaneous TID WC Shaune Pollack, MD   2 Units at 01/26/16 1236  . liothyronine (CYTOMEL) tablet 25 mcg  25 mcg Oral Daily Oralia Manis, MD   25 mcg at 01/26/16 0940  . mirtazapine (REMERON) tablet 15 mg  15 mg Oral QHS Oralia Manis, MD   15 mg at 01/25/16 2245  . morphine (MS CONTIN) 12 hr tablet 45 mg  45 mg Oral Q12H Enid Baas, MD      . morphine (MSIR) tablet 15 mg  15 mg Oral Q6H PRN Milagros Loll, MD   15 mg at 01/26/16 0740  . morphine 4 MG/ML injection 4 mg  4 mg Intravenous Q4H PRN Enid Baas, MD   4 mg at 01/26/16 1241  . mupirocin ointment (BACTROBAN) 2 % 1 application  1 application Nasal BID Enid Baas, MD   1 application at 01/26/16 0941  . ondansetron (ZOFRAN) tablet 4 mg  4 mg Oral Q6H PRN Shaune Pollack, MD       Or  . ondansetron [facility]) injection 4 mg  4 mg Intravenous Q6H PRN Shaune Pollack, MD      . pantoprazole (PROTONIX) EC tablet 40 mg  40 mg Oral QAC breakfast Oralia Manis, MD   40 mg at 01/26/16 0753  . pregabalin (LYRICA) capsule 75 mg  75 mg Oral BID Oralia Manis, MD   75 mg at 01/26/16 0940  . senna (SENOKOT) tablet 8.6 mg  1 tablet Oral BID Milagros Loll, MD   8.6 mg at 01/25/16 0820  . sodium chloride flush (NS) 0.9 % injection 3 mL  3 mL Intravenous Q12H Shaune Pollack, MD   3 mL at 01/26/16 1000  . sodium chloride flush (NS) 0.9 % injection 3 mL  3 mL Intravenous PRN Shaune Pollack, MD      . sodium chloride flush (NS) 0.9 % injection 3 mL  3 mL Intravenous Q12H Linus Galas, DPM   3 mL at 01/26/16 1000  . sodium chloride flush (NS) 0.9 % injection 3 mL  3 mL Intravenous PRN Linus Galas, DPM      . traZODone (DESYREL) tablet 100 mg  100 mg Oral QHS PRN Oralia Manis, MD   100 mg at 01/25/16 2257  . vancomycin (VANCOCIN) 1,250 mg in sodium chloride 0.9 % 250 mL IVPB  1,250 mg Intravenous Q12H Shaune Pollack, MD      . venlafaxine XR (EFFEXOR-XR) 24 hr capsule 150 mg  150  mg Oral Q breakfast Onalee Hua  Anne Hahn, MD   150 mg at 01/26/16 1610     Discharge Medications: Please see discharge summary for a list of discharge medications.  Relevant Imaging Results:  Relevant Lab Results:   Additional Information [id number]  Starr Sinclair, LCSW

## 2016-01-26 NOTE — Progress Notes (Signed)
[facility] Physicians - [place] at [facility]   PATIENT NAME: Jessica Donovan    MR#:  [id number]  DATE OF BIRTH:  19-Aug-1946  SUBJECTIVE:  CHIEF COMPLAINT:  No chief complaint on file.  -complains of significant pain to the amputation site. - PT recommended rehab  REVIEW OF SYSTEMS:    Review of Systems  Constitutional: Positive for malaise/fatigue. Negative for fever and chills.  HENT: Negative for sore throat.   Eyes: Negative for blurred vision, double vision and pain.  Respiratory: Negative for cough, hemoptysis, shortness of breath and wheezing.   Cardiovascular: Negative for chest pain, palpitations, orthopnea and leg swelling.  Gastrointestinal: Negative for heartburn, nausea, vomiting, abdominal pain, diarrhea and constipation.  Genitourinary: Negative for dysuria and hematuria.  Musculoskeletal: Positive for joint pain. Negative for back pain.       Let foot pain  Skin: Negative for rash.  Neurological: Positive for weakness. Negative for sensory change, speech change, focal weakness and headaches.  Endo/Heme/Allergies: Does not bruise/bleed easily.  Psychiatric/Behavioral: Negative for depression. The patient is not nervous/anxious.       DRUG ALLERGIES:   Allergies  Allergen Reactions  . Codeine Nausea And Vomiting  . Influenza Vaccines Other (See Comments)    Passed out  . Methadone Hives  . Percocet [Oxycodone-Acetaminophen] Nausea And Vomiting  . Tetanus Toxoids Swelling  . Tetracyclines & Related Rash    VITALS:  Blood pressure 127/53, pulse 64, temperature 97.7 F (36.5 C), temperature source Oral, resp. rate 18, height  (1.727 m), weight 73.982 kg (163 lb 1.6 oz), SpO2 94 %.  PHYSICAL EXAMINATION:   Physical Exam  GENERAL:  70 y.o.-year-old patient lying in the bed with no acute distress.  EYES: Pupils equal, round, reactive to light and accommodation. No scleral icterus. Extraocular muscles intact.  HEENT: Head atraumatic,  normocephalic. Oropharynx and nasopharynx clear.  NECK:  Supple, no jugular venous distention. No thyroid enlargement, no tenderness.  LUNGS: Normal breath sounds bilaterally, no wheezing, rales, rhonchi. No use of accessory muscles of respiration.  CARDIOVASCULAR: S1, S2 normal. No murmurs, rubs, or gallops.  ABDOMEN: Soft, nontender, nondistended. Bowel sounds present. No organomegaly or mass.  EXTREMITIES: No cyanosis, clubbing or edema b/l.   Dressing left foot in place- s/p surgery NEUROLOGIC:moves all 4 extremitites. No focal neurological deficits. Strength and sensation intact PSYCHIATRIC: The patient is alert and oriented 3 SKIN: No obvious rash, lesion, or ulcer.    LABORATORY PANEL:   CBC  Recent Labs Lab 01/24/16 0632  WBC 4.2  HGB 11.0*  HCT 33.0*  PLT 244   ------------------------------------------------------------------------------------------------------------------  Chemistries   Recent Labs Lab 01/26/16 0600  NA 143  K 3.9  CL 108  CO2 27  GLUCOSE 115*  BUN 12  CREATININE 0.53  CALCIUM 8.8*   ------------------------------------------------------------------------------------------------------------------  Cardiac Enzymes No results for input(s): TROPONINI in the last 168 hours. ------------------------------------------------------------------------------------------------------------------  RADIOLOGY:  No results found.   ASSESSMENT AND PLAN:   70 year old female with past medical history significant for hypertension, diabetes, coronary artery disease was sent in from podiatry office secondary to worsening left 2nd toe pain and possible osteomyelitis.  * Left second toe osteomyelitis and cellulitis of left foot- Appreciate podiatry consult. Status post amputation of second metatarsophalangeal joint. -Cultures are growing MSSA, but her MRSA PCR is positive. Continue Vanco and Unasyn. -Check with ID prior to discharge about her oral  medications at discharge now that cultures are back. -Pain control - If pain doesn't improve with  adjusted pain meds- will request podiatry to come and change dressing and see if any complications noted.  * DM-sugars within normal limits. Patient was taking Lantus at home.  can be restarted slowly. SSI, ADA Sugars were within normal limits.  * HTN Continue home meds  * CAD-stable. Continue aspirin and statin at this time. Taking Brilinta and Imdur at home that needs to be restarted at discharge.  *Hypothyroidism-continue home medications  * DVT prophylaxis-start Lovenox   Physical therapy recommended rehabilitation. Patient will be going to [facility] once her pain is better controlled   All the records are reviewed and case discussed with Care Management/Social Workerr. Management plans discussed with the patient, family and they are in agreement.  CODE STATUS: DNR  DVT Prophylaxis: SCDs  TOTAL TIME TAKING CARE OF THIS PATIENT: 32 minutes.   POSSIBLE D/C IN 1-2 DAYS, DEPENDING ON CLINICAL CONDITION.   Kaulin Chaves M.D on 01/26/2016 at 11:57 AM  Between 7am to 6pm - Pager - [phone]  After 6pm go to [email or web address] - password [email or web address] [facility]  [place] [facility]  Office  [phone]  CC: Primary care physician; Geoffery Lyons., PA    Note: This dictation was prepared with Dragon dictation along with smaller phrase technology. Any transcriptional errors that result from this process are unintentional.

## 2016-01-26 NOTE — Progress Notes (Signed)
Pharmacy Antibiotic Note  Jessica Donovan is a 70 y.o. female admitted on 01/23/2016 with osteomyelitis.  Pharmacy has been consulted for Vancomycin dosing. Patient also on unasyn   Plan: Vancomycin trough level resulted @ 13 mcg/ml. Will increase dose to Vancomycin 1250 mg IV q12 hours, which will likely result in trough level around 16 mcg/ml.    Height:  (172.7 cm) Weight: 163 lb 1.6 oz (73.982 kg) IBW/kg (Calculated) : 63.9  Temp (24hrs), Avg:98 F (36.7 C), Min:97.7 F (36.5 C), Max:98.4 F (36.9 C)   Recent Labs Lab 01/23/16 1629 01/23/16 1649 01/24/16 0632 01/26/16 0600 01/26/16 1013  WBC 7.5  --  4.2  --   --   CREATININE  --  0.87 0.70 0.53  --   VANCOTROUGH  --   --   --   --  13    Estimated Creatinine Clearance: 67 mL/min (by C-G formula based on Cr of 0.53).    Allergies  Allergen Reactions  . Codeine Nausea And Vomiting  . Influenza Vaccines Other (See Comments)    Passed out  . Methadone Hives  . Percocet [Oxycodone-Acetaminophen] Nausea And Vomiting  . Tetanus Toxoids Swelling  . Tetracyclines & Related Rash    Antimicrobials this admission: Anti-infectives    Start     Dose/Rate Route Frequency Ordered Stop   01/26/16 2300  vancomycin (VANCOCIN) 1,250 mg in sodium chloride 0.9 % 250 mL IVPB     1,250 mg 166.7 mL/hr over 90 Minutes Intravenous Every 12 hours 01/26/16 1338     01/24/16 1830  Ampicillin-Sulbactam (UNASYN) 3 g in sodium chloride 0.9 % 100 mL IVPB     3 g 100 mL/hr over 60 Minutes Intravenous Every 6 hours 01/24/16 1443     01/23/16 2300  vancomycin (VANCOCIN) IVPB 1000 mg/200 mL premix  Status:  Discontinued     1,000 mg 200 mL/hr over 60 Minutes Intravenous Every 12 hours 01/23/16 2109 01/26/16 1338   01/23/16 1830  Ampicillin-Sulbactam (UNASYN) 3 g in sodium chloride 0.9 % 100 mL IVPB  Status:  Discontinued     3 g 100 mL/hr over 60 Minutes Intravenous Every 8 hours 01/23/16 1821 01/24/16 1443   01/23/16 1630  vancomycin  (VANCOCIN) IVPB 1000 mg/200 mL premix     1,000 mg 200 mL/hr over 60 Minutes Intravenous  Once 01/23/16 1625 01/23/16 1746      Dose adjustments this admission: Continue Vancomycin 1 gm IV q12h.  Trough ordered for 2/5 at 1030.  Microbiology results: Results for orders placed or performed during the hospital encounter of 01/23/16  Anaerobic culture     Status: None (Preliminary result)   Collection Time: 01/24/16  8:47 AM  Result Value Ref Range Status   Specimen Description TOE  Final   Special Requests NONE  Final   Culture NO ANAEROBES ISOLATED  Final   Report Status PENDING  Incomplete  Wound culture     Status: None (Preliminary result)   Collection Time: 01/24/16  8:47 AM  Result Value Ref Range Status   Specimen Description WOUND  Final   Special Requests NONE  Final   Gram Stain   Final    RARE WBC SEEN FEW GRAM NEGATIVE RODS RARE GRAM POSITIVE COCCI    Culture MODERATE STAPHYLOCOCCUS AUREUS  Final   Report Status PENDING  Incomplete   Organism ID, Bacteria STAPHYLOCOCCUS AUREUS  Final      Susceptibility   Staphylococcus aureus - MIC*    CIPROFLOXACIN >=8  RESISTANT Resistant     ERYTHROMYCIN >=8 RESISTANT Resistant     GENTAMICIN <=0.5 SENSITIVE Sensitive     OXACILLIN <=0.25 SENSITIVE Sensitive     TETRACYCLINE <=1 SENSITIVE Sensitive     VANCOMYCIN 1 SENSITIVE Sensitive     TRIMETH/SULFA <=10 SENSITIVE Sensitive     CLINDAMYCIN <=0.25 RESISTANT Resistant     RIFAMPIN <=0.5 SENSITIVE Sensitive     Inducible Clindamycin Value in next row Resistant      POSITIVEINDUCIBLE CLINDAMYCIN RESISTANCE - A positive ICR test is indicative of inducible resistance to macrolides, lincosamides, and type B streptogramin.  This isolate is presumed to be resistant to Clindamycin, however, Clindamycin may still be effective in some patients.     * MODERATE STAPHYLOCOCCUS AUREUS  MRSA PCR Screening     Status: Abnormal   Collection Time: 01/24/16  5:22 PM  Result Value Ref Range  Status   MRSA by PCR POSITIVE (A) NEGATIVE Final    Comment: CRITICAL RESULT CALLED TO, READ BACK BY AND VERIFIED WITH: MEGAN OAKLEY ON 01/24/16 AT 1852 BY TLB        The GeneXpert MRSA Assay (FDA approved for NASAL specimens only), is one component of a comprehensive MRSA colonization surveillance program. It is not intended to diagnose MRSA infection nor to guide or monitor treatment for MRSA infections.     Thank you for allowing pharmacy to be a part of this patient's care.  Travius Crochet D 01/26/2016 1:38 PM

## 2016-01-26 NOTE — Clinical Social Work Note (Signed)
Clinical Social Work Assessment  Patient Details  Name: Jessica Donovan MRN: [id number] Date of Birth: 09/14/1946  Date of referral:  01/26/16               Reason for consult:  Facility Placement                Permission sought to share information with:  Other, Chartered certified accountant granted to share information::  Yes, Verbal Permission Granted  Name::        Agency::     Relationship::     Contact Information:     Housing/Transportation Living arrangements for the past 2 months:  Apartment Source of Information:  Patient, Medical Team Patient Interpreter Needed:  None Criminal Activity/Legal Involvement Pertinent to Current Situation/Hospitalization:    Significant Relationships:  Adult Children Lives with:  Siblings Do you feel safe going back to the place where you live?  Yes Need for family participation in patient care:  No (Coment)  Care giving concerns:  None at this time   Facilities manager / plan:  CSW received consult for new SNF placement. CSW met with patient at bedside. Patient was alert and oriented and was lying in bed. Patient reported that she had right and left toe removed. Prior to admission, patient reported using a Rollator and cane. Patient states she currently lives at home alone with one dog in [place]. She has social support from her friend Jessica Donovan. Patient is in agreeance to d/c to SNF. Patient states she has been at a SNF before in [place] for about nine days. Patient is requesting [facility] as top choices for placement. CSW will complete FL2, fax to facilities and follow-up with patient oh bed offers.    Employment status:  Disabled (Comment on whether or not currently receiving Disability) Insurance information:  Other (Comment Required) ([employer]) PT Recommendations:  [place] / Referral to community resources:  [place]  Patient/Family's  Response to care:  Patient was appreciative with talking to CSW  Patient/Family's Understanding of and Emotional Response to Diagnosis, Current Treatment, and Prognosis:  Patient understands that she is under continued medical work up at this time. Once medically stable she will discharge to SNF for short-term rehab.   Emotional Assessment Appearance:  Appears stated age Attitude/Demeanor/Rapport:    Affect (typically observed):  Calm, Accepting, Adaptable, Pleasant Orientation:  Oriented to Self, Oriented to Place, Oriented to  Time, Oriented to Situation Alcohol / Substance use:  Never Used Psych involvement (Current and /or in the community):  No (Comment)  Discharge Needs  Concerns to be addressed:  No discharge needs identified Readmission within the last 30 days:  No Current discharge risk:  None Barriers to Discharge:  No Barriers Identified, Continued Medical Work up   [facility]	[place], LCSW 01/26/2016, 3:55 PM

## 2016-01-27 DIAGNOSIS — M869 Osteomyelitis, unspecified: Secondary | ICD-10-CM | POA: Diagnosis not present

## 2016-01-27 DIAGNOSIS — K5221 Food protein-induced enterocolitis syndrome: Secondary | ICD-10-CM | POA: Diagnosis not present

## 2016-01-27 DIAGNOSIS — L03116 Cellulitis of left lower limb: Secondary | ICD-10-CM | POA: Diagnosis not present

## 2016-01-27 DIAGNOSIS — A4901 Methicillin susceptible Staphylococcus aureus infection, unspecified site: Secondary | ICD-10-CM | POA: Diagnosis not present

## 2016-01-27 DIAGNOSIS — M6282 Rhabdomyolysis: Secondary | ICD-10-CM | POA: Diagnosis not present

## 2016-01-27 DIAGNOSIS — I2510 Atherosclerotic heart disease of native coronary artery without angina pectoris: Secondary | ICD-10-CM | POA: Diagnosis not present

## 2016-01-27 DIAGNOSIS — Z89422 Acquired absence of other left toe(s): Secondary | ICD-10-CM | POA: Diagnosis not present

## 2016-01-27 DIAGNOSIS — Z4781 Encounter for orthopedic aftercare following surgical amputation: Secondary | ICD-10-CM | POA: Diagnosis not present

## 2016-01-27 DIAGNOSIS — E119 Type 2 diabetes mellitus without complications: Secondary | ICD-10-CM | POA: Diagnosis not present

## 2016-01-27 DIAGNOSIS — E118 Type 2 diabetes mellitus with unspecified complications: Secondary | ICD-10-CM | POA: Diagnosis not present

## 2016-01-27 DIAGNOSIS — I10 Essential (primary) hypertension: Secondary | ICD-10-CM | POA: Diagnosis not present

## 2016-01-27 DIAGNOSIS — R531 Weakness: Secondary | ICD-10-CM | POA: Diagnosis not present

## 2016-01-27 DIAGNOSIS — I252 Old myocardial infarction: Secondary | ICD-10-CM | POA: Diagnosis not present

## 2016-01-27 DIAGNOSIS — Z8673 Personal history of transient ischemic attack (TIA), and cerebral infarction without residual deficits: Secondary | ICD-10-CM | POA: Diagnosis not present

## 2016-01-27 DIAGNOSIS — E039 Hypothyroidism, unspecified: Secondary | ICD-10-CM | POA: Diagnosis not present

## 2016-01-27 DIAGNOSIS — N3090 Cystitis, unspecified without hematuria: Secondary | ICD-10-CM | POA: Diagnosis not present

## 2016-01-27 LAB — WOUND CULTURE

## 2016-01-27 LAB — URINALYSIS COMPLETE WITH MICROSCOPIC (ARMC ONLY)
BILIRUBIN URINE: NEGATIVE
Glucose, UA: NEGATIVE mg/dL
Hgb urine dipstick: NEGATIVE
KETONES UR: NEGATIVE mg/dL
LEUKOCYTES UA: NEGATIVE
Nitrite: NEGATIVE
PROTEIN: NEGATIVE mg/dL
SPECIFIC GRAVITY, URINE: 1.012 (ref 1.005–1.030)
pH: 7 (ref 5.0–8.0)

## 2016-01-27 LAB — GLUCOSE, CAPILLARY
GLUCOSE-CAPILLARY: 114 mg/dL — AB (ref 65–99)
Glucose-Capillary: 131 mg/dL — ABNORMAL HIGH (ref 65–99)

## 2016-01-27 LAB — SURGICAL PATHOLOGY

## 2016-01-27 MED ORDER — MORPHINE SULFATE 15 MG PO TABS: 15.0000 mg | ORAL_TABLET | Freq: Four times a day (QID) | ORAL | Status: DC | PRN

## 2016-01-27 MED ORDER — AMOXICILLIN-POT CLAVULANATE 875-125 MG PO TABS
1.0000 | ORAL_TABLET | Freq: Two times a day (BID) | ORAL | Status: DC
  Administered 2016-01-27: 10:00:00 1 via ORAL
  Filled 2016-01-27: qty 1

## 2016-01-27 MED ORDER — MUPIROCIN 2 % EX OINT: 1.0000 "application " | TOPICAL_OINTMENT | Freq: Two times a day (BID) | CUTANEOUS | Status: DC

## 2016-01-27 MED ORDER — AMOXICILLIN-POT CLAVULANATE 875-125 MG PO TABS: 1.0000 | ORAL_TABLET | Freq: Two times a day (BID) | ORAL | Status: DC

## 2016-01-27 MED ORDER — SENNA 8.6 MG PO TABS: 1.0000 | ORAL_TABLET | Freq: Two times a day (BID) | ORAL | Status: DC

## 2016-01-27 MED ORDER — MORPHINE SULFATE ER 15 MG PO TBCR: 45.0000 mg | EXTENDED_RELEASE_TABLET | Freq: Two times a day (BID) | ORAL | Status: DC

## 2016-01-27 MED ORDER — ALPRAZOLAM 1 MG PO TABS: 1.0000 mg | ORAL_TABLET | Freq: Three times a day (TID) | ORAL | Status: DC | PRN

## 2016-01-27 MED ORDER — CYCLOBENZAPRINE HCL 10 MG PO TABS: 10.0000 mg | ORAL_TABLET | Freq: Three times a day (TID) | ORAL | Status: DC | PRN

## 2016-01-27 NOTE — Discharge Summary (Addendum)
[facility] - [place] at [facility]   PATIENT NAME: Jessica Donovan    MR#:  [id number]  DATE OF BIRTH:  08/09/1946  DATE OF ADMISSION:  01/23/2016 ADMITTING PHYSICIAN: Shaune Pollack, MD  DATE OF DISCHARGE: 01/27/2016  PRIMARY CARE PHYSICIAN: Geoffery Lyons., PA    ADMISSION DIAGNOSIS:  Osteomyelitis Lt 2nd Toe Osteomyelitis, cellulitis left foot  DISCHARGE DIAGNOSIS:  Active Problems:   Osteomyelitis of toe (HCC)   SECONDARY DIAGNOSIS:   Past Medical History  Diagnosis Date  . Hypertension   . Diabetes mellitus without complication (HCC)   . Coronary artery disease   . Low back pain   . Stroke (HCC)   . Myocardial infarction (HCC)   . Chronic back pain     HOSPITAL COURSE:   70 year old female with past medical history significant for hypertension, diabetes, coronary artery disease was sent in from podiatry office secondary to worsening left 2nd toe pain and possible osteomyelitis.  * Left second toe osteomyelitis and cellulitis of left foot- Appreciate podiatry consult. Status post amputation of second metatarsophalangeal joint. -Cultures are growing MSSA, appreciate ID consult -Being discharged on Augmentin for 10 days. -Pain control - No dressing changes until seen by podiatry in the office in 2 days  * DM-sugars within normal limits. Patient was actually not using the Lantus anymore at home Continue carb controlled diet and follow-up with PCP in 1-2 weeks  * HTN Continue home meds- on Imdur  * CAD-stable. Continue aspirin and statin at this time. Taking Brilinta and Imdur at home which are restarted at discharge.  *Chronic bladder infections-continue home medications.  *Hypothyroidism-continue home medications  Physical therapy recommended rehabilitation. Patient will be discharged to rehabilitation today  DISCHARGE CONDITIONS:   Stable  CONSULTS OBTAINED:  Treatment Team:  Linus Galas, DPM Clydie Braun, MD  DRUG  ALLERGIES:   Allergies  Allergen Reactions  . Codeine Nausea And Vomiting  . Influenza Vaccines Other (See Comments)    Passed out  . Methadone Hives  . Percocet [Oxycodone-Acetaminophen] Nausea And Vomiting  . Tetanus Toxoids Swelling  . Tetracyclines & Related Rash    DISCHARGE MEDICATIONS:   Current Discharge Medication List    START taking these medications   Details  amoxicillin-clavulanate (AUGMENTIN) 875-125 MG tablet Take 1 tablet by mouth every 12 (twelve) hours. X 8 more days Qty: 16 tablet, Refills: 0    cyclobenzaprine (FLEXERIL) 10 MG tablet Take 1 tablet (10 mg total) by mouth 3 (three) times daily as needed for muscle spasms. Qty: 20 tablet, Refills: 0    mupirocin ointment (BACTROBAN) 2 % Place 1 application into the nose 2 (two) times daily. X 5 days Qty: 22 g, Refills: 0    senna (SENOKOT) 8.6 MG TABS tablet Take 1 tablet (8.6 mg total) by mouth 2 (two) times daily. Qty: 120 each, Refills: 0      CONTINUE these medications which have CHANGED   Details  ALPRAZolam (XANAX) 1 MG tablet Take 1 tablet (1 mg total) by mouth 3 (three) times daily as needed for anxiety. Qty: 20 tablet, Refills: 0    morphine (MS CONTIN) 15 MG 12 hr tablet Take 3 tablets (45 mg total) by mouth every 12 (twelve) hours. Qty: 18 tablet, Refills: 0    morphine (MSIR) 15 MG tablet Take 1 tablet (15 mg total) by mouth every 6 (six) hours as needed for moderate pain or severe pain. Qty: 30 tablet, Refills: 0      CONTINUE these  medications which have NOT CHANGED   Details  solifenacin (VESICARE) 10 MG tablet Take by mouth daily.    aspirin EC 81 MG tablet Take 81 mg by mouth daily.    atorvastatin (LIPITOR) 20 MG tablet Take 20 mg by mouth daily.    diphenoxylate-atropine (LOMOTIL) 2.5-0.025 MG tablet Take 1 tablet by mouth 4 (four) times daily as needed for diarrhea or loose stools.    ergocalciferol (VITAMIN D2) 50000 UNITS capsule Take 50,000 Units by mouth once a week.     ferrous sulfate 325 (65 FE) MG tablet Take 325 mg by mouth daily with breakfast.    isosorbide mononitrate (IMDUR) 30 MG 24 hr tablet Take 30 mg by mouth daily. Reported on 12/07/2015    liothyronine (CYTOMEL) 25 MCG tablet Take 25 mcg by mouth daily.    mirtazapine (REMERON) 15 MG tablet Take 15 mg by mouth at bedtime.    nitroGLYCERIN (NITROSTAT) 0.4 MG SL tablet Place 0.4 mg under the tongue every 5 (five) minutes as needed for chest pain.    omeprazole (PRILOSEC) 20 MG capsule Take 20 mg by mouth daily.    pregabalin (LYRICA) 75 MG capsule Take 1 capsule (75 mg total) by mouth 2 (two) times daily. Qty: 60 capsule, Refills: 0    ticagrelor (BRILINTA) 90 MG TABS tablet Take 90 mg by mouth 2 (two) times daily.    tolterodine (DETROL LA) 4 MG 24 hr capsule Take 4 mg by mouth daily.    traZODone (DESYREL) 100 MG tablet Take 100 mg by mouth at bedtime as needed for sleep.    venlafaxine XR (EFFEXOR-XR) 150 MG 24 hr capsule Take 150 mg by mouth daily with breakfast.      STOP taking these medications     gabapentin (NEURONTIN) 100 MG capsule      insulin glargine (LANTUS) 100 UNIT/ML injection          DISCHARGE INSTRUCTIONS:   1. Podiatry follow-up in 2 days as scheduled. 2. PCP follow-up in 1-2 weeks   If you experience worsening of your admission symptoms, develop shortness of breath, life threatening emergency, suicidal or homicidal thoughts you must seek medical attention immediately by calling 911 or calling your MD immediately  if symptoms less severe.  You Must read complete instructions/literature along with all the possible adverse reactions/side effects for all the Medicines you take and that have been prescribed to you. Take any new Medicines after you have completely understood and accept all the possible adverse reactions/side effects.   Please note  You were cared for by a hospitalist during your hospital stay. If you have any questions about your discharge  medications or the care you received while you were in the hospital after you are discharged, you can call the unit and asked to speak with the hospitalist on call if the hospitalist that took care of you is not available. Once you are discharged, your primary care physician will handle any further medical issues. Please note that NO REFILLS for any discharge medications will be authorized once you are discharged, as it is imperative that you return to your primary care physician (or establish a relationship with a primary care physician if you do not have one) for your aftercare needs so that they can reassess your need for medications and monitor your lab values.    Today   CHIEF COMPLAINT:  No chief complaint on file.   VITAL SIGNS:  Blood pressure 158/58, pulse 93, temperature 99.6 F (37.6  C), temperature source Oral, resp. rate 18, height  (1.727 m), weight 76.613 kg (168 lb 14.4 oz), SpO2 92 %.  I/O:   Intake/Output Summary (Last 24 hours) at 01/27/16 1031 Last data filed at 01/27/16 0500  Gross per 24 hour  Intake    800 ml  Output      0 ml  Net    800 ml    PHYSICAL EXAMINATION:   Physical Exam  GENERAL: 70 y.o.-year-old patient lying in the bed with no acute distress.  EYES: Pupils equal, round, reactive to light and accommodation. No scleral icterus. Extraocular muscles intact.  HEENT: Head atraumatic, normocephalic. Oropharynx and nasopharynx clear.  NECK: Supple, no jugular venous distention. No thyroid enlargement, no tenderness.  LUNGS: Normal breath sounds bilaterally, no wheezing, rales, rhonchi. No use of accessory muscles of respiration.  CARDIOVASCULAR: S1, S2 normal. No murmurs, rubs, or gallops.  ABDOMEN: Soft, nontender, nondistended. Bowel sounds present. No organomegaly or mass.  EXTREMITIES: No cyanosis, clubbing or edema b/l. Dressing left foot in place- s/p surgery NEUROLOGIC:moves all 4 extremitites. No focal neurological deficits.  Strength and sensation intact PSYCHIATRIC: The patient is alert and oriented 3 SKIN: No obvious rash, lesion, or ulcer.   DATA REVIEW:   CBC  Recent Labs Lab 01/24/16 0632  WBC 4.2  HGB 11.0*  HCT 33.0*  PLT 244    Chemistries   Recent Labs Lab 01/26/16 0600  NA 143  K 3.9  CL 108  CO2 27  GLUCOSE 115*  BUN 12  CREATININE 0.53  CALCIUM 8.8*    Cardiac Enzymes No results for input(s): TROPONINI in the last 168 hours.  Microbiology Results  Results for orders placed or performed during the hospital encounter of 01/23/16  Anaerobic culture     Status: None (Preliminary result)   Collection Time: 01/24/16  8:47 AM  Result Value Ref Range Status   Specimen Description TOE  Final   Special Requests NONE  Final   Culture NO ANAEROBES ISOLATED  Final   Report Status PENDING  Incomplete  Wound culture     Status: None   Collection Time: 01/24/16  8:47 AM  Result Value Ref Range Status   Specimen Description WOUND  Final   Special Requests NONE  Final   Gram Stain   Final    RARE WBC SEEN FEW GRAM NEGATIVE RODS RARE GRAM POSITIVE COCCI    Culture MODERATE GROWTH STAPHYLOCOCCUS AUREUS  Final   Report Status 01/27/2016 FINAL  Final   Organism ID, Bacteria STAPHYLOCOCCUS AUREUS  Final      Susceptibility   Staphylococcus aureus - MIC*    CIPROFLOXACIN >=8 RESISTANT Resistant     ERYTHROMYCIN >=8 RESISTANT Resistant     GENTAMICIN <=0.5 SENSITIVE Sensitive     OXACILLIN <=0.25 SENSITIVE Sensitive     TETRACYCLINE <=1 SENSITIVE Sensitive     VANCOMYCIN 1 SENSITIVE Sensitive     TRIMETH/SULFA <=10 SENSITIVE Sensitive     CLINDAMYCIN <=0.25 RESISTANT Resistant     RIFAMPIN <=0.5 SENSITIVE Sensitive     Inducible Clindamycin Value in next row Resistant      POSITIVEINDUCIBLE CLINDAMYCIN RESISTANCE - A positive ICR test is indicative of inducible resistance to macrolides, lincosamides, and type B streptogramin.  This isolate is presumed to be resistant to  Clindamycin, however, Clindamycin may still be effective in some patients.     * MODERATE GROWTH STAPHYLOCOCCUS AUREUS  MRSA PCR Screening     Status: Abnormal   Collection  Time: 01/24/16  5:22 PM  Result Value Ref Range Status   MRSA by PCR POSITIVE (A) NEGATIVE Final    Comment: CRITICAL RESULT CALLED TO, READ BACK BY AND VERIFIED WITH: MEGAN OAKLEY ON 01/24/16 AT 1852 BY TLB        The GeneXpert MRSA Assay (FDA approved for NASAL specimens only), is one component of a comprehensive MRSA colonization surveillance program. It is not intended to diagnose MRSA infection nor to guide or monitor treatment for MRSA infections.     RADIOLOGY:  No results found.  EKG:   Orders placed or performed during the hospital encounter of 01/23/16  . EKG 12-Lead  . EKG 12-Lead      Management plans discussed with the patient, family and they are in agreement.  CODE STATUS:     Code Status Orders        Start     Ordered   01/24/16 1555  Do not attempt resuscitation (DNR)   Continuous    Question Answer Comment  In the event of cardiac or respiratory ARREST Do not call a "code blue"   In the event of cardiac or respiratory ARREST Do not perform Intubation, CPR, defibrillation or ACLS   In the event of cardiac or respiratory ARREST Use medication by any route, position, wound care, and other measures to relive pain and suffering. May use oxygen, suction and manual treatment of airway obstruction as needed for comfort.      01/24/16 1554    Code Status History    Date Active Date Inactive Code Status Order ID Comments User Context   01/23/2016  4:07 PM 01/23/2016  4:22 PM Full Code [id number]  Shaune Pollack, MD Inpatient   12/07/2015 10:23 PM 12/09/2015  6:58 PM Full Code [id number]  Auburn Bilberry, MD Inpatient   11/29/2015  5:41 PM 12/01/2015  6:33 PM DNR [id number]  Shaune Pollack, MD Inpatient   11/10/2015  9:54 AM 11/12/2015  9:39 PM DNR [id number]  Linus Galas, MD Inpatient   11/07/2015  5:32  PM 11/10/2015  9:54 AM DNR [id number]  Altamese Dilling, MD Inpatient   11/07/2015  3:49 PM 11/07/2015  5:32 PM Full Code [id number]  Altamese Dilling, MD Inpatient    Advance Directive Documentation        Most Recent Value   Type of Advance Directive  Healthcare Power of Attorney, Living will   Pre-existing out of facility DNR order (yellow form or pink MOST form)     "MOST" Form in Place?        TOTAL TIME TAKING CARE OF THIS PATIENT: 37 minutes.    Lunna Vogelgesang M.D on 01/27/2016 at 10:31 AM  Between 7am to 6pm - Pager - [phone]  After 6pm go to [email or web address] - password [email or web address] [email or web address]  [facility] [place] [facility]  Office  [phone]  CC: Primary care physician; Geoffery Lyons., PA

## 2016-01-27 NOTE — Clinical Social Work Note (Signed)
Pt is ready for discharge today to [facility]. CSW provided bed offers, and pt chose [facility]. CSW notified facility of bed choice and also requested auth from [facility]. Pt is aware and agreeable to discharge plan. [name] is ready to admit pt as discharge information has been received. Pt's friend will provide transportation. [employer] auth for STR at SNF has been obtained Berkley Harvey # [id number]). RN to call report [phone]). Pt's friend is providing transportation to facility. CSW is signing off as no further needs identified.   Dede Query, MSW, LCSW  Clinical Social Worker [phone]

## 2016-01-27 NOTE — Clinical Social Work Placement (Signed)
   CLINICAL SOCIAL WORK PLACEMENT  NOTE  Date:  01/27/2016  Patient Details  Name: Jessica Donovan MRN: [id number] Date of Birth: 1946-12-04  Clinical Social Work is seeking post-discharge placement for this patient at the Skilled  Nursing Facility level of care (*CSW will initial, date and re-position this form in  chart as items are completed):  Yes   Patient/family provided with [facility] Clinical Social Work Department's list of facilities offering this level of care within the geographic area requested by the patient (or if unable, by the patient's family).  Yes   Patient/family informed of their freedom to choose among providers that offer the needed level of care, that participate in Medicare, Medicaid or managed care program needed by the patient, have an available bed and are willing to accept the patient.  Yes   Patient/family informed of [facility]'s ownership interest in [facility] and [facility], as well as of the fact that they are under no obligation to receive care at these facilities.  PASRR submitted to EDS on       PASRR number received on       Existing PASRR number confirmed on 01/26/16     FL2 transmitted to all facilities in geographic area requested by pt/family on 01/26/16     FL2 transmitted to all facilities within larger geographic area on       Patient informed that his/her managed care company has contracts with or will negotiate with certain facilities, including the following:        Yes   Patient/family informed of bed offers received.  Patient chooses bed at [facility]     Physician recommends and patient chooses bed at  [facility])    Patient to be transferred to [facility] on 01/27/16.  Patient to be transferred to facility by Pt's friend     Patient family notified on 01/27/16 of transfer.  Name of family member notified:  Pt called her friend     PHYSICIAN       Additional Comment:     _______________________________________________ Dede Query, LCSW 01/27/2016, 3:38 PM

## 2016-01-27 NOTE — Care Management Important Message (Signed)
Important Message  Patient Details  Name: Jessica Donovan MRN: [id number] Date of Birth: 1946/07/24   Medicare Important Message Given:  Yes    Gwenette Greet, RN 01/27/2016, 11:38 AM

## 2016-01-27 NOTE — Plan of Care (Signed)
Pt d/ced to [facility] for rehab after 2nd L toe amputation.  Called report to [facility] at  [phone].  Nurse tech removed IV.  Pt had minimal c/o pain.  Gave PRN pain med 1x.  Pt will be going on PO abx.  Friend will be transporting pt to facility.

## 2016-01-28 LAB — ANAEROBIC CULTURE

## 2016-01-30 ENCOUNTER — Telehealth: Payer: Self-pay | Admitting: Infectious Diseases

## 2016-01-30 DIAGNOSIS — K5221 Food protein-induced enterocolitis syndrome: Secondary | ICD-10-CM | POA: Diagnosis not present

## 2016-01-30 DIAGNOSIS — E119 Type 2 diabetes mellitus without complications: Secondary | ICD-10-CM | POA: Diagnosis not present

## 2016-01-30 NOTE — Telephone Encounter (Signed)
ID chart review  Reviewed cx to fu after dc to ensure appropriate abx. MRSA sensitive to Doxy on which she was discharged. No further action taken

## 2016-02-21 ENCOUNTER — Other Ambulatory Visit: Payer: Self-pay | Admitting: *Deleted

## 2016-02-21 DIAGNOSIS — E1110 Type 2 diabetes mellitus with ketoacidosis without coma: Secondary | ICD-10-CM

## 2016-02-21 DIAGNOSIS — Z794 Long term (current) use of insulin: Principal | ICD-10-CM

## 2016-02-21 NOTE — Patient Outreach (Addendum)
[facility] Care Management  02/21/2016  Jessica DanceBarbara Donovan 1946-10-03 [id number]  Subjective:  Telephone call to patient's home, spoke with patient, and HIPAA verified.   [facility] services and patient in agreement to receive services.  Patient gave verbal authorization for RNCM to speak with friend [name] regarding her healthcare as needed.  Patient states she is hoarse due to a sore throat.   States she is needing assistance with congestive heart failure education, housekeeping community resources, depression screening follow up, transition of care follow up due to recent hospitalization, home safety evaluation due to fall history, and medication management due to decrease vision.   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    Patient states she has a history of back pain due past back fractures.    Patient states she has the following equipment: straight cane, quad cane, electronic wheelchair, and rollator.   Patient states she has a history of depression, currently taking medication, and has seen a counselor in the past.  States she is in agreement with MD being notified of her depression screening score, and referral to [facility] Social Work[facility].   Patient states she needs assistance with medication manager due to poor vision, has a hard time seeing her medications.   States she has a pill box but has trouble filling it.   Patient in agreement to referral to [facility] for medication management assistance and medication review.  Patient in agreement to receive [facility] services.   Telephone call to patient's emergency contact [name] per patient's request, no answer, left HIPAA compliant voice mail message, and requested call back.  Objective: Per Epic case review:  Patient hospitalized 01/23/16 - 01/27/16 for osteomyelitis of left 2nd toe.  Assessment: Re[facility] referral [name] [facility] on 02/13/16.    Reason for  referral:  Multiple hospital, home health, rehab stay in the last few months.   Came home on Monday from rehab, has fallen, no family support, feels patient needs additional support.   Anna phoned home health to follow up for physical therapy, occupational therapy, RN, and was advised they were not able to assist patient for multiple reasons.  Contact [name] for additional details.    Diagnosis: Congestive Heart Failure and Diabetes.    Patient [facility] services.  No Telephonic RNCM needs at this time.  Plan:  RNCM will refer [facility] to [facility] RNCM for congestive heart failure education, housekeeping community resources, depression screening follow up, transition of care follow up due to recent hospitalization, home safety evaluation due to fall history, medication review, and medication management due to decrease vision.   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 RNCM will [facility] for [facility] review and medication management due to decrease vision. RNCM will refer patient to [facility] Social Work[facility] for  depression screening follow up and housekeeping community resources.    Stephaine Breshears H. Gardiner Barefootooper RN, BSN, CCM [facility] Care Management [facility] Telephonic CM Phone: [phone] Fax: [phone]

## 2016-02-23 ENCOUNTER — Emergency Department: Payer: PPO

## 2016-02-23 ENCOUNTER — Encounter: Payer: Self-pay | Admitting: Internal Medicine

## 2016-02-23 ENCOUNTER — Observation Stay
Admission: EM | Admit: 2016-02-23 | Discharge: 2016-02-25 | Disposition: A | Discharge status: Home / Self Care | Payer: PPO | Attending: Specialist | Admitting: Specialist

## 2016-02-23 DIAGNOSIS — S0990XA Unspecified injury of head, initial encounter: Secondary | ICD-10-CM | POA: Insufficient documentation

## 2016-02-23 DIAGNOSIS — I2510 Atherosclerotic heart disease of native coronary artery without angina pectoris: Secondary | ICD-10-CM | POA: Insufficient documentation

## 2016-02-23 DIAGNOSIS — Z8249 Family history of ischemic heart disease and other diseases of the circulatory system: Secondary | ICD-10-CM | POA: Diagnosis not present

## 2016-02-23 DIAGNOSIS — Z9889 Other specified postprocedural states: Secondary | ICD-10-CM | POA: Diagnosis not present

## 2016-02-23 DIAGNOSIS — E785 Hyperlipidemia, unspecified: Secondary | ICD-10-CM | POA: Diagnosis not present

## 2016-02-23 DIAGNOSIS — I639 Cerebral infarction, unspecified: Secondary | ICD-10-CM

## 2016-02-23 DIAGNOSIS — Z887 Allergy status to serum and vaccine status: Secondary | ICD-10-CM | POA: Diagnosis not present

## 2016-02-23 DIAGNOSIS — E1140 Type 2 diabetes mellitus with diabetic neuropathy, unspecified: Secondary | ICD-10-CM | POA: Insufficient documentation

## 2016-02-23 DIAGNOSIS — F419 Anxiety disorder, unspecified: Secondary | ICD-10-CM | POA: Insufficient documentation

## 2016-02-23 DIAGNOSIS — W19XXXA Unspecified fall, initial encounter: Secondary | ICD-10-CM | POA: Diagnosis not present

## 2016-02-23 DIAGNOSIS — R5381 Other malaise: Secondary | ICD-10-CM | POA: Diagnosis present

## 2016-02-23 DIAGNOSIS — Z885 Allergy status to narcotic agent status: Secondary | ICD-10-CM | POA: Insufficient documentation

## 2016-02-23 DIAGNOSIS — S0101XA Laceration without foreign body of scalp, initial encounter: Secondary | ICD-10-CM | POA: Insufficient documentation

## 2016-02-23 DIAGNOSIS — Z8673 Personal history of transient ischemic attack (TIA), and cerebral infarction without residual deficits: Secondary | ICD-10-CM | POA: Diagnosis not present

## 2016-02-23 DIAGNOSIS — Z881 Allergy status to other antibiotic agents status: Secondary | ICD-10-CM | POA: Diagnosis not present

## 2016-02-23 DIAGNOSIS — S199XXA Unspecified injury of neck, initial encounter: Secondary | ICD-10-CM | POA: Diagnosis not present

## 2016-02-23 DIAGNOSIS — Z79899 Other long term (current) drug therapy: Secondary | ICD-10-CM | POA: Diagnosis not present

## 2016-02-23 DIAGNOSIS — M549 Dorsalgia, unspecified: Secondary | ICD-10-CM | POA: Diagnosis not present

## 2016-02-23 DIAGNOSIS — R42 Dizziness and giddiness: Secondary | ICD-10-CM | POA: Diagnosis not present

## 2016-02-23 DIAGNOSIS — I252 Old myocardial infarction: Secondary | ICD-10-CM | POA: Diagnosis not present

## 2016-02-23 DIAGNOSIS — Z66 Do not resuscitate: Secondary | ICD-10-CM | POA: Diagnosis not present

## 2016-02-23 DIAGNOSIS — R296 Repeated falls: Secondary | ICD-10-CM | POA: Insufficient documentation

## 2016-02-23 DIAGNOSIS — E039 Hypothyroidism, unspecified: Secondary | ICD-10-CM | POA: Insufficient documentation

## 2016-02-23 DIAGNOSIS — Z89422 Acquired absence of other left toe(s): Secondary | ICD-10-CM | POA: Insufficient documentation

## 2016-02-23 DIAGNOSIS — G8929 Other chronic pain: Secondary | ICD-10-CM | POA: Diagnosis not present

## 2016-02-23 DIAGNOSIS — Z7982 Long term (current) use of aspirin: Secondary | ICD-10-CM | POA: Insufficient documentation

## 2016-02-23 DIAGNOSIS — I6521 Occlusion and stenosis of right carotid artery: Secondary | ICD-10-CM | POA: Insufficient documentation

## 2016-02-23 DIAGNOSIS — E119 Type 2 diabetes mellitus without complications: Secondary | ICD-10-CM

## 2016-02-23 DIAGNOSIS — Z7901 Long term (current) use of anticoagulants: Secondary | ICD-10-CM | POA: Diagnosis not present

## 2016-02-23 DIAGNOSIS — R55 Syncope and collapse: Principal | ICD-10-CM | POA: Insufficient documentation

## 2016-02-23 DIAGNOSIS — IMO0002 Reserved for concepts with insufficient information to code with codable children: Secondary | ICD-10-CM

## 2016-02-23 DIAGNOSIS — I10 Essential (primary) hypertension: Secondary | ICD-10-CM | POA: Diagnosis not present

## 2016-02-23 DIAGNOSIS — R220 Localized swelling, mass and lump, head: Secondary | ICD-10-CM | POA: Diagnosis not present

## 2016-02-23 DIAGNOSIS — S0003XA Contusion of scalp, initial encounter: Secondary | ICD-10-CM | POA: Insufficient documentation

## 2016-02-23 DIAGNOSIS — E1165 Type 2 diabetes mellitus with hyperglycemia: Secondary | ICD-10-CM

## 2016-02-23 DIAGNOSIS — E118 Type 2 diabetes mellitus with unspecified complications: Secondary | ICD-10-CM

## 2016-02-23 DIAGNOSIS — Z9181 History of falling: Secondary | ICD-10-CM | POA: Diagnosis not present

## 2016-02-23 HISTORY — DX: Osteomyelitis, unspecified: M86.9

## 2016-02-23 LAB — LIPID PANEL
CHOL/HDL RATIO: 2.8 ratio
CHOLESTEROL: 141 mg/dL (ref 0–200)
HDL: 50 mg/dL (ref >40)
LDL Cholesterol: 65 mg/dL (ref 0–99)
Triglycerides: 129 mg/dL (ref <150)
VLDL: 26 mg/dL (ref 0–40)

## 2016-02-23 LAB — COMPREHENSIVE METABOLIC PANEL
ALBUMIN: 3.7 g/dL (ref 3.5–5.0)
ALT: 9 U/L — AB (ref 14–54)
AST: 22 U/L (ref 15–41)
Alkaline Phosphatase: 74 U/L (ref 38–126)
Anion gap: 8 (ref 5–15)
BUN: 18 mg/dL (ref 6–20)
CO2: 25 mmol/L (ref 22–32)
CREATININE: 0.68 mg/dL (ref 0.44–1.00)
Calcium: 9.2 mg/dL (ref 8.9–10.3)
Chloride: 107 mmol/L (ref 101–111)
GFR calc Af Amer: >60 mL/min (ref >60)
GLUCOSE: 190 mg/dL — AB (ref 65–99)
POTASSIUM: 4.2 mmol/L (ref 3.5–5.1)
Sodium: 140 mmol/L (ref 135–145)
TOTAL PROTEIN: 6.2 g/dL — AB (ref 6.5–8.1)

## 2016-02-23 LAB — CBC
HEMATOCRIT: 37.9 % (ref 35.0–47.0)
Hemoglobin: 12.3 g/dL (ref 12.0–16.0)
MCH: 30.8 pg (ref 26.0–34.0)
MCHC: 32.4 g/dL (ref 32.0–36.0)
MCV: 95 fL (ref 80.0–100.0)
PLATELETS: 225 10*3/uL (ref 150–440)
RBC: 3.99 MIL/uL (ref 3.80–5.20)
RDW: 14.2 % (ref 11.5–14.5)
WBC: 8.3 10*3/uL (ref 3.6–11.0)

## 2016-02-23 LAB — TROPONIN I

## 2016-02-23 MED ORDER — MIRTAZAPINE 15 MG PO TABS
15.0000 mg | ORAL_TABLET | Freq: Every day | ORAL | Status: DC
  Administered 2016-02-23 – 2016-02-24 (×2): 15 mg via ORAL
  Filled 2016-02-23 (×2): qty 1

## 2016-02-23 MED ORDER — ISOSORBIDE MONONITRATE ER 30 MG PO TB24
30.0000 mg | ORAL_TABLET | Freq: Every day | ORAL | Status: DC
  Administered 2016-02-24 – 2016-02-25 (×2): 30 mg via ORAL
  Filled 2016-02-23 (×2): qty 1

## 2016-02-23 MED ORDER — INSULIN ASPART 100 UNIT/ML ~~LOC~~ SOLN
0.0000 [IU] | Freq: Three times a day (TID) | SUBCUTANEOUS | Status: DC
  Administered 2016-02-24 – 2016-02-25 (×3): 1 [IU] via SUBCUTANEOUS
  Administered 2016-02-25: 2 [IU] via SUBCUTANEOUS
  Filled 2016-02-23: qty 1
  Filled 2016-02-23: qty 2
  Filled 2016-02-23 (×2): qty 1

## 2016-02-23 MED ORDER — SENNA 8.6 MG PO TABS
1.0000 | ORAL_TABLET | Freq: Two times a day (BID) | ORAL | Status: DC
  Administered 2016-02-24: 8.6 mg via ORAL
  Filled 2016-02-23 (×3): qty 1

## 2016-02-23 MED ORDER — TRAZODONE HCL 100 MG PO TABS
100.0000 mg | ORAL_TABLET | Freq: Every evening | ORAL | Status: DC | PRN
  Administered 2016-02-23 – 2016-02-24 (×2): 100 mg via ORAL
  Filled 2016-02-23 (×2): qty 1

## 2016-02-23 MED ORDER — LIDOCAINE-EPINEPHRINE (PF) 1 %-1:200000 IJ SOLN
INTRAMUSCULAR | Status: AC
  Filled 2016-02-23: qty 30

## 2016-02-23 MED ORDER — TICAGRELOR 90 MG PO TABS
90.0000 mg | ORAL_TABLET | Freq: Two times a day (BID) | ORAL | Status: DC
  Administered 2016-02-23 – 2016-02-25 (×4): 90 mg via ORAL
  Filled 2016-02-23 (×4): qty 1

## 2016-02-23 MED ORDER — ASPIRIN EC 81 MG PO TBEC
81.0000 mg | DELAYED_RELEASE_TABLET | Freq: Every day | ORAL | Status: DC
Start: 2016-02-24 — End: 2016-02-25
  Administered 2016-02-24 – 2016-02-25 (×2): 81 mg via ORAL
  Filled 2016-02-23 (×2): qty 1

## 2016-02-23 MED ORDER — MORPHINE SULFATE 15 MG PO TABS
15.0000 mg | ORAL_TABLET | Freq: Four times a day (QID) | ORAL | Status: DC | PRN
  Administered 2016-02-24 – 2016-02-25 (×3): 15 mg via ORAL
  Filled 2016-02-23 (×3): qty 1

## 2016-02-23 MED ORDER — SODIUM CHLORIDE 0.9 % IV SOLN
1000.0000 mL | Freq: Once | INTRAVENOUS | Status: AC
  Administered 2016-02-23: 1000 mL via INTRAVENOUS

## 2016-02-23 MED ORDER — ASPIRIN 325 MG PO TABS
325.0000 mg | ORAL_TABLET | Freq: Every day | ORAL | Status: DC
  Administered 2016-02-23: 325 mg via ORAL
  Filled 2016-02-23: qty 1

## 2016-02-23 MED ORDER — HEPARIN SODIUM (PORCINE) 5000 UNIT/ML IJ SOLN
5000.0000 [IU] | Freq: Three times a day (TID) | INTRAMUSCULAR | Status: DC
  Administered 2016-02-23 – 2016-02-25 (×5): 5000 [IU] via SUBCUTANEOUS
  Filled 2016-02-23 (×5): qty 1

## 2016-02-23 MED ORDER — NITROGLYCERIN 0.4 MG SL SUBL: 0.4000 mg | SUBLINGUAL_TABLET | SUBLINGUAL | Status: DC | PRN

## 2016-02-23 MED ORDER — ATORVASTATIN CALCIUM 20 MG PO TABS: 40.0000 mg | ORAL_TABLET | Freq: Every day | ORAL | Status: DC

## 2016-02-23 MED ORDER — CYCLOBENZAPRINE HCL 10 MG PO TABS
10.0000 mg | ORAL_TABLET | Freq: Three times a day (TID) | ORAL | Status: DC | PRN
  Administered 2016-02-24: 10 mg via ORAL
  Filled 2016-02-23: qty 1

## 2016-02-23 MED ORDER — VENLAFAXINE HCL ER 75 MG PO CP24
150.0000 mg | ORAL_CAPSULE | Freq: Every day | ORAL | Status: DC
  Administered 2016-02-24 – 2016-02-25 (×2): 150 mg via ORAL
  Filled 2016-02-23 (×2): qty 2

## 2016-02-23 MED ORDER — LIOTHYRONINE SODIUM 25 MCG PO TABS
25.0000 ug | ORAL_TABLET | Freq: Every day | ORAL | Status: DC
  Administered 2016-02-24 – 2016-02-25 (×2): 25 ug via ORAL
  Filled 2016-02-23 (×2): qty 1

## 2016-02-23 MED ORDER — DARIFENACIN HYDROBROMIDE ER 7.5 MG PO TB24
7.5000 mg | ORAL_TABLET | Freq: Every day | ORAL | Status: DC
  Administered 2016-02-24 – 2016-02-25 (×2): 7.5 mg via ORAL
  Filled 2016-02-23 (×2): qty 1

## 2016-02-23 MED ORDER — ACETAMINOPHEN 325 MG PO TABS
650.0000 mg | ORAL_TABLET | Freq: Once | ORAL | Status: AC
  Administered 2016-02-23: 650 mg via ORAL

## 2016-02-23 MED ORDER — ACETAMINOPHEN 325 MG PO TABS
ORAL_TABLET | ORAL | Status: AC
  Administered 2016-02-23: 650 mg via ORAL
  Filled 2016-02-23: qty 2

## 2016-02-23 MED ORDER — LIDOCAINE-EPINEPHRINE (PF) 2 %-1:200000 IJ SOLN
10.0000 mL | Freq: Once | INTRAMUSCULAR | Status: AC
  Administered 2016-02-23: 10 mL
  Filled 2016-02-23: qty 10

## 2016-02-23 MED ORDER — ATORVASTATIN CALCIUM 20 MG PO TABS
20.0000 mg | ORAL_TABLET | Freq: Every day | ORAL | Status: DC
  Administered 2016-02-24 – 2016-02-25 (×2): 20 mg via ORAL
  Filled 2016-02-23 (×2): qty 1

## 2016-02-23 MED ORDER — MORPHINE SULFATE ER 15 MG PO TBCR
30.0000 mg | EXTENDED_RELEASE_TABLET | Freq: Two times a day (BID) | ORAL | Status: DC
  Administered 2016-02-23 – 2016-02-25 (×4): 30 mg via ORAL
  Filled 2016-02-23 (×4): qty 2

## 2016-02-23 MED ORDER — FERROUS SULFATE 325 (65 FE) MG PO TABS
325.0000 mg | ORAL_TABLET | Freq: Every day | ORAL | Status: DC
  Administered 2016-02-24 – 2016-02-25 (×2): 325 mg via ORAL
  Filled 2016-02-23 (×2): qty 1

## 2016-02-23 MED ORDER — PREGABALIN 50 MG PO CAPS
75.0000 mg | ORAL_CAPSULE | Freq: Two times a day (BID) | ORAL | Status: DC
  Administered 2016-02-23 – 2016-02-25 (×4): 75 mg via ORAL
  Filled 2016-02-23 (×4): qty 1

## 2016-02-23 MED ORDER — FESOTERODINE FUMARATE ER 4 MG PO TB24: 4.0000 mg | ORAL_TABLET | Freq: Every day | ORAL | Status: DC

## 2016-02-23 MED ORDER — ALPRAZOLAM 1 MG PO TABS
1.0000 mg | ORAL_TABLET | Freq: Three times a day (TID) | ORAL | Status: DC | PRN
  Administered 2016-02-25: 1 mg via ORAL
  Filled 2016-02-23: qty 1

## 2016-02-23 NOTE — ED Notes (Addendum)
Pt neighbor [name] [phone] called under pt approval to share information. RN gave update on admission status

## 2016-02-23 NOTE — ED Notes (Signed)
Pt given meal tray.

## 2016-02-23 NOTE — H&P (Signed)
[facility] [place] [facility]   PATIENT NAME: Jessica DanceBarbara Donovan    MR#:  [id number]  DATE OF BIRTH:  07-11-1946  DATE OF ADMISSION:  02/23/2016  PRIMARY CARE PHYSICIAN: Lauro RegulusANDERSON,MARSHALL W., MD   REQUESTING/REFERRING PHYSICIAN: Dr Jene Everyobert Kinner  CHIEF COMPLAINT:  Chief Complaint  Patient presents with  . Head Injury    HISTORY OF PRESENT ILLNESS: Jessica DanceBarbara Donovan  is a 70 y.o. female with a known history of HTN, CVA, CAD, chronic back pain, hypothyroidism and osteomyelitis of the toe. She presents to the ED due to syncope. She states that she lives alone and was answering the door when she felt dizzy and had a feeling of "spinning around'. She then fell backwards and the the back of her head. She lost consciousness for a few seconds. She was able to eventually call for help and was brought to the ED. hse denies any previous episode of falls/syncope. She has a history of CVA in 2005 with no residuals. She denies any diaphoresis, N/V/D, decreased appetite, excessive urination,blood loss from any orifice or melanotic stools. She also denies any chest pain, SOB, confusion or seizure. She denies any recent change in her meds but she takes 45mg  bid of morphine for back pain in addition to 15mg  q6h prn. She also take flexeril, xanax and trazodone. She did not overdose on her meds.   In the ED, she was noted to has a scalp laceration and swelling. Vitals were normal with no orthostasis. CBC and CMP were unremarkable and troponin was negative. CT head and C spine were negative except for a parietal scalp tissue swelling on right side. EKG showed NSR with evidence of an old inferior infarct. Patient was given 1L of NS, tylenol and aspirin. She was scheduled to be discharged home from ED but when patient got up, she fell. After being hydrated, another attempt to discharge was unsuccessful du to another fall. A decision was then made to admit her for observation due to syncope/fall and  physical debilty.  Patient is DNR/DNI  PAST MEDICAL HISTORY:   Past Medical History  Diagnosis Date  . Hypertension   . Diabetes mellitus without complication (HCC)   . Coronary artery disease   . Low back pain   . Stroke (HCC)   . Myocardial infarction (HCC)   . Chronic back pain   . Osteomyelitis of toe (HCC) 01/23/2016    PAST SURGICAL HISTORY:  Past Surgical History  Procedure Laterality Date  . Toe amputation    . Amputation toe Left 11/10/2015    Procedure: AMPUTATION TOE;  Surgeon: Linus Galasodd Cline, MD;  Location: [facility] ORS;  Service: Podiatry;  Laterality: Left;  . Cardiac catheterization N/A 11/12/2015    Procedure: Left Heart Cath and Coronary Angiography;  Surgeon: Marcina MillardAlexander Paraschos, MD;  Location: [facility] INVASIVE CV LAB;  Service: Cardiovascular;  Laterality: N/A;  . Amputation toe Left 01/24/2016    Procedure: AMPUTATION TOE (2nd mpj);  Surgeon: Linus Galasodd Cline, DPM;  Location: [facility] ORS;  Service: Podiatry;  Laterality: Left;    SOCIAL HISTORY:  Social History  Substance Use Topics  . Smoking status: Never Smoker   . Smokeless tobacco: Not on file  . Alcohol Use: No    FAMILY HISTORY:  Family History  Problem Relation Age of Onset  . CAD Mother   . CAD Father     DRUG ALLERGIES:  Allergies  Allergen Reactions  . Codeine Nausea And Vomiting  . Influenza Vaccines Other (See Comments)  Passed out  . Methadone Hives  . Percocet [Oxycodone-Acetaminophen] Nausea And Vomiting  . Tetanus Toxoids Swelling  . Tetracyclines & Related Rash    REVIEW OF SYSTEMS:   CONSTITUTIONAL: No fever EYES: No blurred or double vision.  EARS, NOSE, AND THROAT: No tinnitus or ear pain.  RESPIRATORY: No cough, shortness of breath, wheezing or hemoptysis.  CARDIOVASCULAR: No chest pain, orthopnea, edema.  GASTROINTESTINAL: No nausea, vomiting, diarrhea or abdominal pain.  GENITOURINARY: No dysuria, hematuria.  ENDOCRINE: No polyuria, nocturia,  HEMATOLOGY: No anemia SKIN: No rash  or lesion. MUSCULOSKELETAL: No joint pain or arthritis.   NEUROLOGIC: As in HPI  PSYCHIATRY: No anxiety or depression.   MEDICATIONS AT HOME:  Prior to Admission medications   Medication Sig Start Date End Date Taking? Authorizing Provider  ALPRAZolam Prudy Feeler) 1 MG tablet Take 1 tablet (1 mg total) by mouth 3 (three) times daily as needed for anxiety. 01/27/16   Enid Baas, MD  amoxicillin-clavulanate (AUGMENTIN) 875-125 MG tablet Take 1 tablet by mouth every 12 (twelve) hours. X 8 more days 01/27/16   Enid Baas, MD  aspirin EC 81 MG tablet Take 81 mg by mouth daily.    Historical Provider, MD  atorvastatin (LIPITOR) 20 MG tablet Take 20 mg by mouth daily.    Historical Provider, MD  cyclobenzaprine (FLEXERIL) 10 MG tablet Take 1 tablet (10 mg total) by mouth 3 (three) times daily as needed for muscle spasms. 01/27/16   Enid Baas, MD  diphenoxylate-atropine (LOMOTIL) 2.5-0.025 MG tablet Take 1 tablet by mouth 4 (four) times daily as needed for diarrhea or loose stools.    Historical Provider, MD  ergocalciferol (VITAMIN D2) 50000 UNITS capsule Take 50,000 Units by mouth once a week. 11/10/15   Historical Provider, MD  ferrous sulfate 325 (65 FE) MG tablet Take 325 mg by mouth daily with breakfast.    Historical Provider, MD  isosorbide mononitrate (IMDUR) 30 MG 24 hr tablet Take 30 mg by mouth daily. Reported on 12/07/2015    Historical Provider, MD  liothyronine (CYTOMEL) 25 MCG tablet Take 25 mcg by mouth daily.    Historical Provider, MD  mirtazapine (REMERON) 15 MG tablet Take 15 mg by mouth at bedtime.    Historical Provider, MD  morphine (MS CONTIN) 15 MG 12 hr tablet Take 3 tablets (45 mg total) by mouth every 12 (twelve) hours. 01/27/16   Enid Baas, MD  morphine (MSIR) 15 MG tablet Take 1 tablet (15 mg total) by mouth every 6 (six) hours as needed for moderate pain or severe pain. 01/27/16   Enid Baas, MD  mupirocin ointment (BACTROBAN) 2 % Place 1 application  into the nose 2 (two) times daily. X 5 days 01/27/16   Enid Baas, MD  nitroGLYCERIN (NITROSTAT) 0.4 MG SL tablet Place 0.4 mg under the tongue every 5 (five) minutes as needed for chest pain.    Historical Provider, MD  omeprazole (PRILOSEC) 20 MG capsule Take 20 mg by mouth daily.    Historical Provider, MD  pregabalin (LYRICA) 75 MG capsule Take 1 capsule (75 mg total) by mouth 2 (two) times daily. 11/12/15   Houston Siren, MD  senna (SENOKOT) 8.6 MG TABS tablet Take 1 tablet (8.6 mg total) by mouth 2 (two) times daily. 01/27/16   Enid Baas, MD  solifenacin (VESICARE) 10 MG tablet Take by mouth daily.    Historical Provider, MD  ticagrelor (BRILINTA) 90 MG TABS tablet Take 90 mg by mouth 2 (two) times daily.  Historical Provider, MD  tolterodine (DETROL LA) 4 MG 24 hr capsule Take 4 mg by mouth daily.    Historical Provider, MD  traZODone (DESYREL) 100 MG tablet Take 100 mg by mouth at bedtime as needed for sleep.    Historical Provider, MD  venlafaxine XR (EFFEXOR-XR) 150 MG 24 hr capsule Take 150 mg by mouth daily with breakfast.    Historical Provider, MD      PHYSICAL EXAMINATION:   VITAL SIGNS: Blood pressure 123/95, pulse 77, temperature 98.3 F (36.8 C), temperature source Oral, resp. rate 12, height 5\' 8"  (1.727 m), weight 77.565 kg (171 lb), SpO2 94 %.  GENERAL:  70 y.o.-year-old patient lying in the bed with no acute distress. Alert and oriented x 3 but drowsy EYES: Pupils equal, round, reactive to light and accommodation. No scleral icterus. Extraocular muscles intact.  HEENT: Head covered in turban dressing due to scalp laceration. Dressing is clean and dry.  normocephalic. Oropharynx and nasopharynx clear.  NECK:  Supple, no jugular venous distention. No thyroid enlargement, no tenderness.  LUNGS: Normal breath sounds bilaterally, no wheezing, rales,rhonchi or crepitation. No use of accessory muscles of respiration.  CARDIOVASCULAR: S1, S2 normal. No murmurs,  rubs, or gallops.  ABDOMEN: Soft, nontender, nondistended. Bowel sounds present. No organomegaly or mass.  EXTREMITIES: No pedal edema, cyanosis, or clubbing.  NEUROLOGIC: Cranial nerves II through XII are intact. Muscle strength 5/5 in all extremities. Sensation intact. Gait not checked.   SKIN: No obvious rash, lesion, or ulcer.   LABORATORY PANEL:   CBC  Recent Labs Lab 02/23/16 1517  WBC 8.3  HGB 12.3  HCT 37.9  PLT 225  MCV 95.0  MCH 30.8  MCHC 32.4  RDW 14.2   ------------------------------------------------------------------------------------------------------------------  Chemistries   Recent Labs Lab 02/23/16 1517  NA 140  K 4.2  CL 107  CO2 25  GLUCOSE 190*  BUN 18  CREATININE 0.68  CALCIUM 9.2  AST 22  ALT 9*  ALKPHOS 74  BILITOT <0.1*   ------------------------------------------------------------------------------------------------------------------ estimated creatinine clearance is 72.7 mL/min (by C-G formula based on Cr of 0.68). ------------------------------------------------------------------------------------------------------------------ No results for input(s): TSH, T4TOTAL, T3FREE, THYROIDAB in the last 72 hours.  Invalid input(s): FREET3   Coagulation profile No results for input(s): INR, PROTIME in the last 168 hours. ------------------------------------------------------------------------------------------------------------------- No results for input(s): DDIMER in the last 72 hours. -------------------------------------------------------------------------------------------------------------------  Cardiac Enzymes  Recent Labs Lab 02/23/16 1517  TROPONINI <0.03   ------------------------------------------------------------------------------------------------------------------ Invalid input(s): POCBNP  ---------------------------------------------------------------------------------------------------------------  Urinalysis     Component Value Date/Time   COLORURINE STRAW* 01/27/2016 0815   COLORURINE Yellow 12/15/2014 0958   APPEARANCEUR CLEAR* 01/27/2016 0815   APPEARANCEUR Hazy 12/15/2014 0958   LABSPEC 1.012 01/27/2016 0815   LABSPEC 1.014 12/15/2014 0958   PHURINE 7.0 01/27/2016 0815   PHURINE 5.0 12/15/2014 0958   GLUCOSEU NEGATIVE 01/27/2016 0815   GLUCOSEU Negative 12/15/2014 0958   HGBUR NEGATIVE 01/27/2016 0815   HGBUR Negative 12/15/2014 0958   BILIRUBINUR NEGATIVE 01/27/2016 0815   BILIRUBINUR Negative 12/15/2014 0958   KETONESUR NEGATIVE 01/27/2016 0815   KETONESUR Negative 12/15/2014 0958   PROTEINUR NEGATIVE 01/27/2016 0815   PROTEINUR Negative 12/15/2014 0958   NITRITE NEGATIVE 01/27/2016 0815   NITRITE Negative 12/15/2014 0958   LEUKOCYTESUR NEGATIVE 01/27/2016 0815   LEUKOCYTESUR 1+ 12/15/2014 0958     RADIOLOGY: Ct Head Wo Contrast  02/23/2016  CLINICAL DATA:  Fall with trauma to back of head.  On blood thinner. EXAM: CT HEAD WITHOUT CONTRAST CT CERVICAL  SPINE WITHOUT CONTRAST TECHNIQUE: Multidetector CT imaging of the head and cervical spine was performed following the standard protocol without intravenous contrast. Multiplanar CT image reconstructions of the cervical spine were also generated. COMPARISON:  12/15/2014 FINDINGS: CT HEAD FINDINGS Sinuses/Soft tissues: Right parietal scalp soft tissue swelling is moderate-to-marked. Hypoplastic frontal sinuses. No skull fracture. Clear mastoid air cells. Intracranial: Mild cerebral and cerebellar atrophy. No mass lesion, hemorrhage, hydrocephalus, acute infarct, intra-axial, or extra-axial fluid collection. CT CERVICAL SPINE FINDINGS Spinal visualization through bottom of T1. Prevertebral soft tissues are within normal limits. Prevertebral soft tissues are within normal limits. No apical pneumothorax. Skull base intact. Maintenance of vertebral body height and alignment. Expected for age spondylosis, including at C4-5 and C5-6. Facets are  well-aligned. Mild convex left curvature. Coronal reformats demonstrate a normal C1-C2 articulation. IMPRESSION: 1. Right parietal scalp soft tissue swelling, without acute intracranial abnormality. 2.  No acute fracture or subluxation within the cervical spine. Electronically Signed   By: Jeronimo Greaves M.D.   On: 02/23/2016 15:52   Ct Cervical Spine Wo Contrast  02/23/2016  CLINICAL DATA:  Fall with trauma to back of head.  On blood thinner. EXAM: CT HEAD WITHOUT CONTRAST CT CERVICAL SPINE WITHOUT CONTRAST TECHNIQUE: Multidetector CT imaging of the head and cervical spine was performed following the standard protocol without intravenous contrast. Multiplanar CT image reconstructions of the cervical spine were also generated. COMPARISON:  12/15/2014 FINDINGS: CT HEAD FINDINGS Sinuses/Soft tissues: Right parietal scalp soft tissue swelling is moderate-to-marked. Hypoplastic frontal sinuses. No skull fracture. Clear mastoid air cells. Intracranial: Mild cerebral and cerebellar atrophy. No mass lesion, hemorrhage, hydrocephalus, acute infarct, intra-axial, or extra-axial fluid collection. CT CERVICAL SPINE FINDINGS Spinal visualization through bottom of T1. Prevertebral soft tissues are within normal limits. Prevertebral soft tissues are within normal limits. No apical pneumothorax. Skull base intact. Maintenance of vertebral body height and alignment. Expected for age spondylosis, including at C4-5 and C5-6. Facets are well-aligned. Mild convex left curvature. Coronal reformats demonstrate a normal C1-C2 articulation. IMPRESSION: 1. Right parietal scalp soft tissue swelling, without acute intracranial abnormality. 2.  No acute fracture or subluxation within the cervical spine. Electronically Signed   By: Jeronimo Greaves M.D.   On: 02/23/2016 15:52    EKG: Orders placed or performed during the hospital encounter of 02/23/16  . ED EKG  . ED EKG  . EKG 12-Lead  . EKG 12-Lead    ASSESSMENT  Principal Problem:    Syncope and collapse Active Problems:   Multiple falls   Physical debility   DM2 (diabetes mellitus, type 2) (HCC)   Polypharmacy  PLAN   1). Syncope and collapse/Multiple falls/Physical debilty/Polypharmacy - She presented with syncope with a very brief LOC that was preceded by dizziness. She also had 2 falls in the ED. - Patient takes high doses of morphine in addition to flexeril, trazodone, xanax. These may contribute to her syncope/fall - she is very drowsy in the ED. Decrease dose of morphine from 45mg  bid to 30mg  bid. We will consider adjusting other meds. - CT brain was negative and orthostatics were negative. She has a amll laceration on scalp with surrounding swelling.  - Order complete syncope work up - MRI, MR angio of brain, carotid US, 2D echo, Lipid panel, A1C and TSH - Continue Brillinta, ASA and Lipitor.  - PT/OT. Bedside swallow eval - Ensure neuro checks and fall precautions - Orthostatics vitals q6H  2). DM2 (diabetes mellitus, type 2)  - Patient has a history of  DM2 in the charts but is not on any medication. I will check A1C and start patient on SSI.  - Cont consistent carbohydrate diet  All the records are reviewed and case discussed with ED provider. Management plans discussed with the patient, family and they are in agreement.  CODE STATUS: Code Status History    Date Active Date Inactive Code Status Order ID Comments User Context   01/24/2016  3:54 PM 01/27/2016  7:06 PM DNR [id number]  Milagros Loll, MD Inpatient   01/23/2016  4:07 PM 01/23/2016  4:22 PM Full Code [id number]  Shaune Pollack, MD Inpatient   12/07/2015 10:23 PM 12/09/2015  6:58 PM Full Code [id number]  Auburn Bilberry, MD Inpatient   11/29/2015  5:41 PM 12/01/2015  6:33 PM DNR [id number]  Shaune Pollack, MD Inpatient   11/10/2015  9:54 AM 11/12/2015  9:39 PM DNR [id number]  Linus Galas, MD Inpatient   11/07/2015  5:32 PM 11/10/2015  9:54 AM DNR [id number]  Altamese Dilling, MD Inpatient   11/07/2015  3:49 PM  11/07/2015  5:32 PM Full Code [id number]  Altamese Dilling, MD Inpatient    Questions for Most Recent Historical Code Status (Order [id number])    Question Answer Comment   In the event of cardiac or respiratory ARREST Do not call a "code blue"    In the event of cardiac or respiratory ARREST Do not perform Intubation, CPR, defibrillation or ACLS    In the event of cardiac or respiratory ARREST Use medication by any route, position, wound care, and other measures to relive pain and suffering. May use oxygen, suction and manual treatment of airway obstruction as needed for comfort.       I have independently reviewed all EKG and chest x-ray data  VTE prophylaxis: Heparin if no contraindications and patient at low risk for bleeding. SCD's and progressive ambulation if patient has contraindications to anticoagulants. No DVT prophylaxis if patient presently receiving therapeutic anticoagulation or is at significant risk of bleeding for which the risk of anticoagulation outweigh the potential benefits.  Vaccinations: Pneumonia & flu vaccine per hospital protocol Prevention: Will proceed with conservative measures for the prevention of delirium in patients older than 65. Fall precautions and 1:1 sitter as needed per hospital protocol.  TOTAL TIME TAKING CARE OF THIS PATIENT: 50 minutes.    Robley Fries M.D on 02/23/2016 at 9:50 PM  Between 7am to 6pm - Pager - [phone]  After 6pm go to [email or web address] - password [email or web address] [facility]  [facility]  Office  [phone]  CC: Primary care physician; Lauro Regulus., MD

## 2016-02-23 NOTE — ED Notes (Signed)
Patient's neighbor, [name] [phone]), came to pick up patient when she was up for discharge. Discharged delayed at this time. Per patient, notify Ms. Green with discharge/admission decision when it is determined.

## 2016-02-23 NOTE — ED Notes (Signed)
Patient's bandages saturated in blood again. Rewrapped in gauze bandage again. Awaiting patient's friend to take patient home.

## 2016-02-23 NOTE — Discharge Instructions (Signed)

## 2016-02-23 NOTE — ED Provider Notes (Signed)
[facility] Emergency Department Provider Note  ____________________________________________   I have reviewed the triage vital signs and the nursing notes.   HISTORY  Chief Complaint Head Injury    HPI Jessica Donovan is a 69 y.o. female who presents after a fall. Patient reports that her doorbell rang so she went to answer the doorbut when she got there and it was there and somehow she lost her balance and fell backwards and hit the back of her head. She denies loss of consciousness. She denies dizziness. No chest pain. No abdominal pain. No nausea or vomiting. No difficulty breathing. She complains of mild neck pain primarily posterior head pain. She does take brilinta.     Past Medical History  Diagnosis Date  . Hypertension   . Diabetes mellitus without complication (HCC)   . Coronary artery disease   . Low back pain   . Stroke (HCC)   . Myocardial infarction (HCC)   . Chronic back pain     Patient Active Problem List   Diagnosis Date Noted  . Osteomyelitis of toe (HCC) 01/23/2016  . Rhabdomyolysis 12/07/2015  . ARF (acute renal failure) (HCC) 11/29/2015  . Hypotension 11/29/2015  . 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 Angina effort (HCC) 11/07/2015  . Osteomyelitis (HCC) 11/07/2015    Past Surgical History  Procedure Laterality Date  . Toe amputation    . 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 Cardiac catheterization N/A 11/12/2015    Procedure: Left Heart Cath and Coronary Angiography;  Surgeon: Marcina Millard, MD;  Location: [facility] INVASIVE CV LAB;  Service: Cardiovascular;  Laterality: N/A;  . Amputation toe Left 01/24/2016    Procedure: AMPUTATION TOE (2nd mpj);  Surgeon: Linus Galas, DPM;  Location: [facility] ORS;  Service: Podiatry;  Laterality: Left;    Current Outpatient Rx  Name  Route  Sig  Dispense  Refill  .  ALPRAZolam (XANAX) 1 MG tablet   Oral   Take 1 tablet (1 mg total) by mouth 3 (three) times daily as needed for anxiety.   20 tablet   0   . amoxicillin-clavulanate (AUGMENTIN) 875-125 MG tablet   Oral   Take 1 tablet by mouth every 12 (twelve) hours. X 8 more days   16 tablet   0   . aspirin EC 81 MG tablet   Oral   Take 81 mg by mouth daily.         [name] atorvastatin (LIPITOR) 20 MG tablet   Oral   Take 20 mg by mouth daily.         . cyclobenzaprine (FLEXERIL) 10 MG tablet   Oral   Take 1 tablet (10 mg total) by mouth 3 (three) times daily as needed for muscle spasms.   20 tablet   0   . diphenoxylate-atropine (LOMOTIL) 2.5-0.025 MG tablet   Oral   Take 1 tablet by mouth 4 (four) times daily as needed for diarrhea or loose stools.         . ergocalciferol (VITAMIN D2) 50000 UNITS capsule   Oral   Take 50,000 Units by mouth once a week.         . ferrous sulfate 325 (65 FE) MG tablet   Oral   Take 325 mg by mouth daily with breakfast.         . isosorbide mononitrate (IMDUR) 30 MG 24  hr tablet   Oral   Take 30 mg by mouth daily. Reported on 12/07/2015         . liothyronine (CYTOMEL) 25 MCG tablet   Oral   Take 25 mcg by mouth daily.         . mirtazapine (REMERON) 15 MG tablet   Oral   Take 15 mg by mouth at bedtime.         [name] morphine (MS CONTIN) 15 MG 12 hr tablet   Oral   Take 3 tablets (45 mg total) by mouth every 12 (twelve) hours.   18 tablet   0   . morphine (MSIR) 15 MG tablet   Oral   Take 1 tablet (15 mg total) by mouth every 6 (six) hours as needed for moderate pain or severe pain.   30 tablet   0   . mupirocin ointment (BACTROBAN) 2 %   Nasal   Place 1 application into the nose 2 (two) times daily. X 5 days   22 g   0   . nitroGLYCERIN (NITROSTAT) 0.4 MG SL tablet   Sublingual   Place 0.4 mg under the tongue every 5 (five) minutes as needed for chest pain.         [name] omeprazole (PRILOSEC) 20 MG capsule   Oral    Take 20 mg by mouth daily.         . pregabalin (LYRICA) 75 MG capsule   Oral   Take 1 capsule (75 mg total) by mouth 2 (two) times daily.   60 capsule   0   . senna (SENOKOT) 8.6 MG TABS tablet   Oral   Take 1 tablet (8.6 mg total) by mouth 2 (two) times daily.   120 each   0   . solifenacin (VESICARE) 10 MG tablet   Oral   Take by mouth daily.         . ticagrelor (BRILINTA) 90 MG TABS tablet   Oral   Take 90 mg by mouth 2 (two) times daily.         [name] tolterodine (DETROL LA) 4 MG 24 hr capsule   Oral   Take 4 mg by mouth daily.         . traZODone (DESYREL) 100 MG tablet   Oral   Take 100 mg by mouth at bedtime as needed for sleep.         [name] venlafaxine XR (EFFEXOR-XR) 150 MG 24 hr capsule   Oral   Take 150 mg by mouth daily with breakfast.           Allergies Codeine; Influenza vaccines; Methadone; Percocet; Tetanus toxoids; and Tetracyclines & related  Family History  Problem Relation Age of Onset  . CAD Mother   . CAD Father     Social History Social History  Substance Use Topics  . Smoking status: Never Smoker   . Smokeless tobacco: Not on file  . Alcohol Use: No    Review of Systems  Constitutional: Negative for fever. Negative for dizziness Eyes: Negative for visual changes. ENT: Mild neck pain Cardiovascular: Negative for chest pain. Respiratory: Negative for shortness of breath. Gastrointestinal: Negative for abdominal pain, vomitin  Musculoskeletal: Negative for back pain. Skin: Positive for laceration, back pain Neurological: No focal weakness Psychiatric no anxiety    ____________________________________________   PHYSICAL EXAM:  VITAL SIGNS: ED Triage Vitals  Enc Vitals Group     BP --  Pulse --      Resp --      Temp --      Temp src --      SpO2 --      Weight --      Height --      Head Cir --      Peak Flow --      Pain Score --      Pain Loc --      Pain Edu? --      Excl. in GC? --       Constitutional: Alert and oriented. Well appearing and in no distress. Eyes: Conjunctivae are normal. PERRLA ENT   Head: Normocephalic. Significant amount of dried blood to the posterior head   Mouth/Throat: Mucous membranes are moist. Cardiovascular: Normal rate, regular rhythm. Normal and symmetric distal pulses are present in all extremities.  Respiratory: Normal respiratory effort without tachypnea nor retractions. Breath sounds are clear and equal bilaterally.  Gastrointestinal: Soft and non-tender in all quadrants. No distention. There is no CVA tenderness. Genitourinary: deferred Musculoskeletal: Nontender with normal range of motion in all extremities. No lower extremity tenderness nor edema. No  vertebral tenderness to palpation full range of motion of neck without pain Neurologic:  Normal speech and language. No gross focal neurologic deficits are appreciated. Skin:  Skin is warm, dry and intact. No rash noted. Psychiatric: Mood and affect are normal. Patient exhibits appropriate insight and judgment.  ____________________________________________    LABS (pertinent positives/negatives)  Labs Reviewed - No data to display  ____________________________________________   EKG  ED ECG REPORT I, Jene EveryKINNER, Terryn Redner, the attending physician, personally viewed and interpreted this ECG.  Date: 02/23/2016 EKG Time: 7:24 PM Rate: 81 Rhythm: normal sinus rhythm QRS Axis: normal Intervals: normal ST/T Wave abnormalities: normal Conduction Disturbances: none Narrative Interpretation: Old infarcts noted   ____________________________________________    RADIOLOGY I have personally reviewed any xrays that were ordered on this patient: CT head and cervical spine unremarkable  ____________________________________________   PROCEDURES  Procedure(s) performed: none  Critical Care performed: none  ____________________________________________   INITIAL IMPRESSION  / ASSESSMENT AND PLAN / ED COURSE  Pertinent labs & imaging results that were available during my care of the patient were reviewed by me and considered in my medical decision making (see chart for details).  Patient presents after likely mechanical fall. We will obtain CT head and cervical spine and clean off her head to evaluate likely laceration  CT head and cervical spine unremarkable. Patient was unable to ambulate and is very unsteady on her feet so unable to safely discharg. We will send check labs and EKG and reevaluate  ----------------------------------------- 8:43 PM on 02/23/2016 -----------------------------------------  Lab work unremarkable. Patient is resting quietly. We will attempt to ambulate again  Patient continues to feel dizzy upon ambulating. She lives alone. I will admit as I do not feel she is safe to go home alone at this time  ____________________________________________   FINAL CLINICAL IMPRESSION(S) / ED DIAGNOSES  Final diagnoses:  Fall with injury  Scalp hematoma, initial encounter  Dizziness     Jene Everyobert Zamere Pasternak, MD 02/23/16 2056

## 2016-02-23 NOTE — ED Notes (Addendum)
Patient's bandaging was saturated in blood. Patient's head rewrapped with gauze bandages after wound irrigated. Hematoma present, but unable to be sutured.

## 2016-02-23 NOTE — ED Notes (Signed)
Pt arrives via ems, states that she heard a knock at her door and went to see who it was and states that as she was walking backwards she slipped, falling, hitting the back of her head on a concrete type of flooring in her apt. Pt appears sleepy at this time, states that she is on a blood thinner, brilynta, pt states that she didn't pass out, pt appears sleepy at this time, states that she didn't sleep well last night, was up til 3am, states that she takes a lot of meds that cause sleepiness and dizziness. States that she took her xanax at 0700. Pt also states that she was here last week for falls, surgery to amputate her toes for diabetes, pt lives a lone

## 2016-02-23 NOTE — Care Management Obs Status (Signed)
MEDICARE OBSERVATION STATUS NOTIFICATION   Patient Details  Name: Jessica Donovan MRN: [id number] Date of Birth: May 17, 1946   Medicare Observation Status Notification Given:  Yes    CrutchfieldDerrill Memo, Mateja Dier M, RN 02/23/2016, 9:41 PM

## 2016-02-23 NOTE — ED Notes (Signed)
Patient asked for something for headache. Dr. Cyril LoosenKinner aware. Per Dr. Cyril LoosenKinner, patient to wait d/t AMS/level of lethargy at this time.

## 2016-02-24 ENCOUNTER — Encounter: Payer: Self-pay | Admitting: *Deleted

## 2016-02-24 ENCOUNTER — Observation Stay
Admit: 2016-02-24 | Discharge: 2016-02-24 | Disposition: A | Discharge status: Home / Self Care | Payer: PPO | Attending: Internal Medicine | Admitting: Internal Medicine

## 2016-02-24 ENCOUNTER — Other Ambulatory Visit: Payer: Self-pay | Admitting: *Deleted

## 2016-02-24 ENCOUNTER — Observation Stay: Payer: PPO

## 2016-02-24 DIAGNOSIS — R55 Syncope and collapse: Secondary | ICD-10-CM | POA: Diagnosis not present

## 2016-02-24 DIAGNOSIS — E039 Hypothyroidism, unspecified: Secondary | ICD-10-CM | POA: Diagnosis not present

## 2016-02-24 DIAGNOSIS — R296 Repeated falls: Secondary | ICD-10-CM | POA: Diagnosis not present

## 2016-02-24 DIAGNOSIS — I6523 Occlusion and stenosis of bilateral carotid arteries: Secondary | ICD-10-CM | POA: Diagnosis not present

## 2016-02-24 DIAGNOSIS — M549 Dorsalgia, unspecified: Secondary | ICD-10-CM | POA: Diagnosis not present

## 2016-02-24 DIAGNOSIS — R5381 Other malaise: Secondary | ICD-10-CM | POA: Diagnosis not present

## 2016-02-24 LAB — BASIC METABOLIC PANEL
ANION GAP: 7 (ref 5–15)
BUN: 15 mg/dL (ref 6–20)
CHLORIDE: 107 mmol/L (ref 101–111)
CO2: 27 mmol/L (ref 22–32)
Calcium: 9 mg/dL (ref 8.9–10.3)
Creatinine, Ser: 0.6 mg/dL (ref 0.44–1.00)
GFR calc Af Amer: >60 mL/min (ref >60)
GFR calc non Af Amer: >60 mL/min (ref >60)
Glucose, Bld: 180 mg/dL — ABNORMAL HIGH (ref 65–99)
POTASSIUM: 4.3 mmol/L (ref 3.5–5.1)
SODIUM: 141 mmol/L (ref 135–145)

## 2016-02-24 LAB — CBC
HEMATOCRIT: 35.2 % (ref 35.0–47.0)
HEMOGLOBIN: 11.6 g/dL — AB (ref 12.0–16.0)
MCH: 30.6 pg (ref 26.0–34.0)
MCHC: 33 g/dL (ref 32.0–36.0)
MCV: 92.9 fL (ref 80.0–100.0)
Platelets: 211 10*3/uL (ref 150–440)
RBC: 3.79 MIL/uL — ABNORMAL LOW (ref 3.80–5.20)
RDW: 14.2 % (ref 11.5–14.5)
WBC: 6.7 10*3/uL (ref 3.6–11.0)

## 2016-02-24 LAB — GLUCOSE, CAPILLARY
GLUCOSE-CAPILLARY: 140 mg/dL — AB (ref 65–99)
GLUCOSE-CAPILLARY: 149 mg/dL — AB (ref 65–99)
Glucose-Capillary: 164 mg/dL — ABNORMAL HIGH (ref 65–99)
Glucose-Capillary: 132 mg/dL — ABNORMAL HIGH (ref 65–99)

## 2016-02-24 LAB — HEMOGLOBIN A1C: Hgb A1c MFr Bld: 6.6 % — ABNORMAL HIGH (ref 4.0–6.0)

## 2016-02-24 LAB — MRSA PCR SCREENING: MRSA by PCR: NEGATIVE

## 2016-02-24 MED ORDER — ACETAMINOPHEN 325 MG PO TABS
650.0000 mg | ORAL_TABLET | Freq: Four times a day (QID) | ORAL | Status: DC | PRN
  Administered 2016-02-24 – 2016-02-25 (×2): 650 mg via ORAL
  Filled 2016-02-24 (×2): qty 2

## 2016-02-24 NOTE — Progress Notes (Signed)
OT Cancellation Note  Patient Details Name: Jessica DanceBarbara Donovan MRN: [id number] DOB: Sep 29, 1946   Cancelled Treatment:     Pt at medical test and unable to see for OT evaluation.  Will attempt again later today time permitting.    Susanne BordersSusan Wofford, OTR/L ascom [phone]     Wofford,Susan 02/24/2016, 4:20 PM

## 2016-02-24 NOTE — Progress Notes (Signed)
[facility] [place] [facility]   PATIENT NAME: Jessica DanceBarbara Donovan    MR#:  [id number]  DATE OF BIRTH:  [birth year]  SUBJECTIVE:   Patient is here due to a syncopal episode. Clinically denies any complaints presently. No chest pain, nausea, vomiting, headache or any other associated symptoms presently.  REVIEW OF SYSTEMS:    Review of Systems  Constitutional: Negative for fever and chills.  HENT: Negative for congestion and tinnitus.   Eyes: Negative for blurred vision and double vision.  Respiratory: Negative for cough, shortness of breath and wheezing.   Cardiovascular: Negative for chest pain, orthopnea and PND.  Gastrointestinal: Negative for nausea, vomiting, abdominal pain and diarrhea.  Genitourinary: Negative for dysuria and hematuria.  Neurological: Negative for dizziness, sensory change and focal weakness.  All other systems reviewed and are negative.   Nutrition: Carb modified.  Tolerating Diet: Yes Tolerating PT: Await Eval.    DRUG ALLERGIES:   Allergies  Allergen Reactions  . Codeine Nausea And Vomiting  . Influenza Vaccines Other (See Comments)    Passed out  . Methadone Hives  . Percocet [Oxycodone-Acetaminophen] Nausea And Vomiting  . Tetanus Toxoids Swelling  . Tetracyclines & Related Rash    VITALS:  Blood pressure 118/48, pulse 83, temperature 98.3 F (36.8 C), temperature source Oral, resp. rate 16, height 5\' 8"  (1.727 m), weight 75.932 kg (167 lb 6.4 oz), SpO2 93 %.  PHYSICAL EXAMINATION:   Physical Exam  GENERAL:  70 y.o.-year-old patient lying in the bed with no acute distress.  EYES: Pupils equal, round, reactive to light and accommodation. No scleral icterus. Extraocular muscles intact.  HEENT: Head atraumatic, normocephalic. Oropharynx and nasopharynx clear.  NECK:  Supple, no jugular venous distention. No thyroid enlargement, no tenderness.  LUNGS: Normal breath sounds bilaterally, no wheezing, rales, rhonchi.  No use of accessory muscles of respiration.  CARDIOVASCULAR: S1, S2 normal. No murmurs, rubs, or gallops.  ABDOMEN: Soft, nontender, nondistended. Bowel sounds present. No organomegaly or mass.  EXTREMITIES: No cyanosis, clubbing or edema b/l.    NEUROLOGIC: Cranial nerves II through XII are intact. No focal Motor or sensory deficits b/l.  Globally weak.  PSYCHIATRIC: The patient is alert and oriented x 3.  SKIN: No obvious rash, lesion, or ulcer.    LABORATORY PANEL:   CBC  Recent Labs Lab 02/24/16 0613  WBC 6.7  HGB 11.6*  HCT 35.2  PLT 211   ------------------------------------------------------------------------------------------------------------------  Chemistries   Recent Labs Lab 02/23/16 1517 02/24/16 0613  NA 140 141  K 4.2 4.3  CL 107 107  CO2 25 27  GLUCOSE 190* 180*  BUN 18 15  CREATININE 0.68 0.60  CALCIUM 9.2 9.0  AST 22  --   ALT 9*  --   ALKPHOS 74  --   BILITOT <0.1*  --    ------------------------------------------------------------------------------------------------------------------  Cardiac Enzymes  Recent Labs Lab 02/23/16 1517  TROPONINI <0.03   ------------------------------------------------------------------------------------------------------------------  RADIOLOGY:  Ct Head Wo Contrast  02/23/2016  CLINICAL DATA:  Fall with trauma to back of head.  On blood thinner. EXAM: CT HEAD WITHOUT CONTRAST CT CERVICAL SPINE WITHOUT CONTRAST TECHNIQUE: Multidetector CT imaging of the head and cervical spine was performed following the standard protocol without intravenous contrast. Multiplanar CT image reconstructions of the cervical spine were also generated. COMPARISON:  12/15/2014 FINDINGS: CT HEAD FINDINGS Sinuses/Soft tissues: Right parietal scalp soft tissue swelling is moderate-to-marked. Hypoplastic frontal sinuses. No skull fracture. Clear mastoid air cells. Intracranial: Mild cerebral and  cerebellar atrophy. No mass lesion,  hemorrhage, hydrocephalus, acute infarct, intra-axial, or extra-axial fluid collection. CT CERVICAL SPINE FINDINGS Spinal visualization through bottom of T1. Prevertebral soft tissues are within normal limits. Prevertebral soft tissues are within normal limits. No apical pneumothorax. Skull base intact. Maintenance of vertebral body height and alignment. Expected for age spondylosis, including at C4-5 and C5-6. Facets are well-aligned. Mild convex left curvature. Coronal reformats demonstrate a normal C1-C2 articulation. IMPRESSION: 1. Right parietal scalp soft tissue swelling, without acute intracranial abnormality. 2.  No acute fracture or subluxation within the cervical spine. Electronically Signed   By: Jeronimo Greaves M.D.   On: 02/23/2016 15:52   Ct Cervical Spine Wo Contrast  02/23/2016  CLINICAL DATA:  Fall with trauma to back of head.  On blood thinner. EXAM: CT HEAD WITHOUT CONTRAST CT CERVICAL SPINE WITHOUT CONTRAST TECHNIQUE: Multidetector CT imaging of the head and cervical spine was performed following the standard protocol without intravenous contrast. Multiplanar CT image reconstructions of the cervical spine were also generated. COMPARISON:  12/15/2014 FINDINGS: CT HEAD FINDINGS Sinuses/Soft tissues: Right parietal scalp soft tissue swelling is moderate-to-marked. Hypoplastic frontal sinuses. No skull fracture. Clear mastoid air cells. Intracranial: Mild cerebral and cerebellar atrophy. No mass lesion, hemorrhage, hydrocephalus, acute infarct, intra-axial, or extra-axial fluid collection. CT CERVICAL SPINE FINDINGS Spinal visualization through bottom of T1. Prevertebral soft tissues are within normal limits. Prevertebral soft tissues are within normal limits. No apical pneumothorax. Skull base intact. Maintenance of vertebral body height and alignment. Expected for age spondylosis, including at C4-5 and C5-6. Facets are well-aligned. Mild convex left curvature. Coronal reformats demonstrate a normal  C1-C2 articulation. IMPRESSION: 1. Right parietal scalp soft tissue swelling, without acute intracranial abnormality. 2.  No acute fracture or subluxation within the cervical spine. Electronically Signed   By: Jeronimo Greaves M.D.   On: 02/23/2016 15:52   Mr Shirlee Latch Wo Contrast  02/24/2016  CLINICAL DATA:  70 year old female with syncope and fall striking the back of her head. Initial encounter. EXAM: MRI HEAD WITHOUT CONTRAST MRA HEAD WITHOUT CONTRAST TECHNIQUE: Multiplanar, multiecho pulse sequences of the brain and surrounding structures were obtained without intravenous contrast. Angiographic images of the head were obtained using MRA technique without contrast. COMPARISON:  Head and cervical spine CT 02/23/2016. FINDINGS: MRI HEAD FINDINGS Right posterior scalp hematoma re- demonstrated. Associated susceptibility artifact on diffusion imaging in this region. No restricted diffusion to suggest acute infarction. No midline shift, mass effect, evidence of mass lesion, ventriculomegaly, extra-axial collection or acute intracranial hemorrhage. Cervicomedullary junction and pituitary are within normal limits. Negative visualized cervical spine. Major intracranial vascular flow voids are preserved. Wallace Cullens and white matter signal is within normal limits for age throughout the brain. Visible internal auditory structures appear normal. Mastoids are clear. Mild paranasal sinus mucosal thickening. Postoperative changes to both globes. Normal bone marrow signal. MRA HEAD FINDINGS Antegrade flow in the posterior circulation, but diminutive vertebrobasilar system. This appears at least in part related to fetal type bilateral PCA origins. Both distal vertebral arteries are patent. Bilateral PICA/AICA origins appear patent. No basilar artery stenosis. SCA origins are patent. Fetal type bilateral PCA origins. Bilateral PCA branches are within normal limits. Antegrade flow in both ICA siphons. Supraclinoid ICA irregularity in  keeping with atherosclerosis, with mild to moderate right supraclinoid stenosis best seen on series 24, image 12. Both carotid termini remain patent. Ophthalmic and posterior communicating artery origins are within normal limits. Anterior communicating artery and visualized ACA branches are within normal limits.  Mildly dolichoectasia attic left MCA M1 segment without stenosis. Left MCA bifurcation and visualized left MCA branches are within normal limits. Mild right M1 segment irregularity without stenosis. Right MCA bifurcation and branches are within normal limits. IMPRESSION: 1. No acute intracranial abnormality and negative for age noncontrast MRI appearance of the brain. 2. ICA siphon atherosclerosis with mild to moderate right ICA supraclinoid stenosis. Bilateral MCA irregularity may also indicate atherosclerosis. No other intracranial stenosis. 3. Right scalp hematoma. Electronically Signed   By: Odessa Fleming M.D.   On: 02/24/2016 10:28   Mr Brain Wo Contrast  02/24/2016  CLINICAL DATA:  70 year old female with syncope and fall striking the back of her head. Initial encounter. EXAM: MRI HEAD WITHOUT CONTRAST MRA HEAD WITHOUT CONTRAST TECHNIQUE: Multiplanar, multiecho pulse sequences of the brain and surrounding structures were obtained without intravenous contrast. Angiographic images of the head were obtained using MRA technique without contrast. COMPARISON:  Head and cervical spine CT 02/23/2016. FINDINGS: MRI HEAD FINDINGS Right posterior scalp hematoma re- demonstrated. Associated susceptibility artifact on diffusion imaging in this region. No restricted diffusion to suggest acute infarction. No midline shift, mass effect, evidence of mass lesion, ventriculomegaly, extra-axial collection or acute intracranial hemorrhage. Cervicomedullary junction and pituitary are within normal limits. Negative visualized cervical spine. Major intracranial vascular flow voids are preserved. Wallace Cullens and white matter signal is  within normal limits for age throughout the brain. Visible internal auditory structures appear normal. Mastoids are clear. Mild paranasal sinus mucosal thickening. Postoperative changes to both globes. Normal bone marrow signal. MRA HEAD FINDINGS Antegrade flow in the posterior circulation, but diminutive vertebrobasilar system. This appears at least in part related to fetal type bilateral PCA origins. Both distal vertebral arteries are patent. Bilateral PICA/AICA origins appear patent. No basilar artery stenosis. SCA origins are patent. Fetal type bilateral PCA origins. Bilateral PCA branches are within normal limits. Antegrade flow in both ICA siphons. Supraclinoid ICA irregularity in keeping with atherosclerosis, with mild to moderate right supraclinoid stenosis best seen on series 24, image 12. Both carotid termini remain patent. Ophthalmic and posterior communicating artery origins are within normal limits. Anterior communicating artery and visualized ACA branches are within normal limits. Mildly dolichoectasia attic left MCA M1 segment without stenosis. Left MCA bifurcation and visualized left MCA branches are within normal limits. Mild right M1 segment irregularity without stenosis. Right MCA bifurcation and branches are within normal limits. IMPRESSION: 1. No acute intracranial abnormality and negative for age noncontrast MRI appearance of the brain. 2. ICA siphon atherosclerosis with mild to moderate right ICA supraclinoid stenosis. Bilateral MCA irregularity may also indicate atherosclerosis. No other intracranial stenosis. 3. Right scalp hematoma. Electronically Signed   By: Odessa Fleming M.D.   On: 02/24/2016 10:28   US Carotid Bilateral  02/24/2016  CLINICAL DATA:  70 year old female with a history of cerebral vascular accident. Cardiovascular risk factors include hypertension, prior stroke/TIA, known coronary artery disease, hyperlipidemia, diabetes. EXAM: BILATERAL CAROTID DUPLEX ULTRASOUND TECHNIQUE:  Wallace Cullens scale imaging, color Doppler and duplex ultrasound were performed of bilateral carotid and vertebral arteries in the neck. COMPARISON:  No prior duplex FINDINGS: Criteria: Quantification of carotid stenosis is based on velocity parameters that correlate the residual internal carotid diameter with NASCET-based stenosis levels, using the diameter of the distal internal carotid lumen as the denominator for stenosis measurement. The following velocity measurements were obtained: RIGHT ICA:  Systolic 119 cm/sec, Diastolic 36 cm/sec CCA:  82 cm/sec SYSTOLIC ICA/CCA RATIO:  125 ECA:  131 cm/sec LEFT ICA:  Systolic 74 cm/sec, Diastolic 26 cm/sec CCA:  88 cm/sec SYSTOLIC ICA/CCA RATIO:  0.8 ECA:  156 cm/sec Right Brachial SBP: Not acquired Left Brachial SBP: Not acquired RIGHT CAROTID ARTERY: Minimal calcifications and atherosclerotic changes of the right common carotid artery. Intermediate waveform maintained. Heterogeneous and partially calcified plaque at the right carotid bifurcation. No significant lumen shadowing. Low resistance waveform of the right ICA. No significant tortuosity. RIGHT VERTEBRAL ARTERY: Antegrade flow with low resistance waveform. LEFT CAROTID ARTERY: Minimal calcifications and atherosclerotic changes of the left common carotid artery. Intermediate waveform maintained. Heterogeneous and partially calcified plaque at the left carotid bifurcation without significant lumen shadowing. Low resistance waveform of the left ICA. No significant tortuosity. LEFT VERTEBRAL ARTERY:  Antegrade flow with low resistance waveform. IMPRESSION: Color duplex indicates minimal heterogeneous and calcified plaque, with no hemodynamically significant stenosis by duplex criteria in the extracranial cerebrovascular circulation. Signed, Yvone Neu. Loreta Ave, DO [facility] Electronically Signed   By: Gilmer Mor D.O.   On: 02/24/2016 11:03     ASSESSMENT AND  PLAN:   70 year old female with past medical history of chronic back pain, anxiety, diabetes, hypertension, history of previous CVA, history of osteomyelitis of the toe who presented to the hospital due to a syncopal episode.  #1 syncope/fall-etiology unclear but related to polypharmacy. -Patient's CT head, MRI MRA of the brain, carotid duplex so far has been essentially negative. -Await echocardiogram results. No alarms on tele. Await physical therapy evaluation.  #2. Chronic back pain-continue MS Contin, short acting morphine as needed and will monitor. - cont. Flexaril.   #3 diabetes type 2 without complication-continue on sliding scale insulin. Follow blood sugars.  #4 diabetic neuropathy-continue Levaquin.  #5 history of coronary artery disease-no acute chest pain. Continue Brillinta, Imdur, ASA, Statin  #6. Hypothyroidism - cont. Cytomel.   #7 Anxiety - cont. Xanax.   Await PT eval.   All the records are reviewed and case discussed with Care Management/Social Workerr. Management plans discussed with the patient, family and they are in agreement.  CODE STATUS: DNR  DVT Prophylaxis: Heparin SQ  TOTAL TIME TAKING CARE OF THIS PATIENT: 30 minutes.   POSSIBLE D/C IN 1-2 DAYS, DEPENDING ON CLINICAL CONDITION.   Houston Siren M.D on 02/24/2016 at 12:06 PM  Between 7am to 6pm - Pager - [phone]  After 6pm go to [email or web address] - password [email or web address] [facility]  [place] [facility]  Office  [phone]  CC: Primary care physician; Lauro Regulus., MD

## 2016-02-24 NOTE — Care Management (Signed)
Patient placed in observation for syncope.  She is currently followed by [facility] and PT. Discussed case with nurse liaison and will add OT aide and SW to referral at discharge.  There is verbalized concern that patient is taking large amount of pain meds. Discussed that home health nursing follow med administration closely especially pain meds.

## 2016-02-24 NOTE — Progress Notes (Signed)
PT Cancellation Note  Patient Details Name: Mellody DanceBarbara Heckendorn MRN: [id number] DOB: 06-Jul-1946   Cancelled Treatment:    Reason Eval/Treat Not Completed: Patient at procedure or test/unavailable (patient off floor for MRI/Echo/US; will attempt PT eval once patient is available and appropriate.)   Emidio Warrell PT, DPT 02/24/2016, 9:25 AM

## 2016-02-24 NOTE — Progress Notes (Signed)
*  PRELIMINARY RESULTS* Echocardiogram 2D Echocardiogram has been performed.  Georgann HousekeeperJerry R Hege 02/24/2016, 8:42 AM

## 2016-02-24 NOTE — Progress Notes (Signed)
Per MD Sainani ok to order APAP for patient

## 2016-02-24 NOTE — Evaluation (Signed)
Physical Therapy Evaluation Patient Details Name: Jessica Donovan MRN: [id number] DOB: 06/19/1946 Today's Date: 02/24/2016   History of Present Illness  Patient is a 70 y/o female that experienced a fall at home while walking and turning. 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   Clinical Impression  Patient received returning from bathroom with RN staff without device. Patient reports she has been using 4WW at home, though she reports several falls that she does not describe as mechanical. Patient had a recent 2nd toe amputation on LLE and should be heel weight bearing only with post-operative shoe, which was not present in this session. She reports no pain during ambulation, even with mild weight bearing in the forefoot. No loss of balance demonstrated and no deficits with sit to stand/bed mobility observed. Further ambulatory evaluation could not be completed due to lack of post-operative shoe, however she appears at her mobility baseline. She may benefit from HHPT to address any issues with home set up or balance deficits with post-operative shoe.     Follow Up Recommendations Home health PT    Equipment Recommendations       Recommendations for Other Services       Precautions / Restrictions Precautions Precautions: Fall Restrictions Weight Bearing Restrictions: Yes Other Position/Activity Restrictions: Patient had recent surgery on L 2nd toe amputation and is 4-5 weeks post op. She should be heel weight bearing in post-operative shoe.       Mobility  Bed Mobility Overal bed mobility: Independent             General bed mobility comments: No deficits identified.   Transfers Overall transfer level: Modified independent Equipment used: Rolling walker (2 wheeled)             General transfer comment: No deficits or loss of balance (though flexed trunk) noted during sit to stand transfer.    Ambulation/Gait Ambulation/Gait assistance: Modified independent (Device/Increase time) Ambulation Distance (Feet): 10 Feet Assistive device: Rolling walker (2 wheeled) Gait Pattern/deviations: WFL(Within Functional Limits)   Gait velocity interpretation: Below normal speed for age/gender General Gait Details: Patient received returning from bathroom, PT questioned patient on WBing status of L foot during ambulation, at which time she reported she was still using offloading shoe which was not currently present. No balance deficits noted with RW.   Stairs            Wheelchair Mobility    Modified Rankin (Stroke Patients Only)       Balance Overall balance assessment: No apparent balance deficits (not formally assessed)                                           Pertinent Vitals/Pain Pain Assessment:  (No reports of pain in this session. )    Home Living Family/patient expects to be discharged to:: Private residence Living Arrangements: Alone Available Help at Discharge: Home health Type of Home: Apartment Home Access: Ramped entrance     Home Layout: One level Home Equipment: Cane - single point;Walker - 4 wheels Additional Comments: about 6 steps total to enter apartment building; ramp available if needed    Prior Function Level of Independence: Independent with assistive device(s)         Comments: Patient reports she has been ambulating at home with rollator and post-operative shoe. She reports  she saw Dr. Graciela HusbandsKlein last week who cleared her for "partial" forefoot weightbearing in shoe.      Hand Dominance        Extremity/Trunk Assessment   Upper Extremity Assessment: Overall WFL for tasks assessed           Lower Extremity Assessment: Overall WFL for tasks assessed;LLE deficits/detail   LLE Deficits / Details: Patient and RN educated to heel weight bear until post-op shoe is available.      Communication   Communication: No  difficulties  Cognition Arousal/Alertness: Awake/alert Behavior During Therapy: WFL for tasks assessed/performed Overall Cognitive Status: Within Functional Limits for tasks assessed (Patient has a history of Short term memory loss, she is roughly at her baseline. )                      General Comments      Exercises        Assessment/Plan    PT Assessment Patient needs continued PT services  PT Diagnosis Difficulty walking   PT Problem List Decreased balance;Decreased mobility;Decreased strength  PT Treatment Interventions DME instruction;Gait training;Therapeutic activities;Therapeutic exercise;Balance training   PT Goals (Current goals can be found in the Care Plan section) Acute Rehab PT Goals Patient Stated Goal: To return home safely.  PT Goal Formulation: With patient Time For Goal Achievement: 03/09/16 Potential to Achieve Goals: Good    Frequency Min 2X/week   Barriers to discharge        Co-evaluation               End of Session Equipment Utilized During Treatment: Gait belt Activity Tolerance: Patient tolerated treatment well Patient left: in bed;with call bell/phone within reach;with bed alarm set Nurse Communication: Mobility status;Weight bearing status (Informed RN patient to be heel weight bearing until offloading shoe arrives)    Functional Assessment Tool Used: Clinical judgement  Functional Limitation: Mobility: Walking and moving around Mobility: Walking and Moving Around Current Status ([id number](G8978): At least 1 percent but less than 20 percent impaired, limited or restricted Mobility: Walking and Moving Around Goal Status [phone](G8979): At least 1 percent but less than 20 percent impaired, limited or restricted    Time: [id number] PT Time Calculation (min) (ACUTE ONLY): 16 min   Charges:   PT Evaluation $PT Eval Moderate Complexity: 1 Procedure     PT G Codes:   PT G-Codes **NOT FOR INPATIENT CLASS** Functional Assessment Tool Used:  Clinical judgement  Functional Limitation: Mobility: Walking and moving around Mobility: Walking and Moving Around Current Status (N8295(G8978): At least 1 percent but less than 20 percent impaired, limited or restricted Mobility: Walking and Moving Around Goal Status [phone](G8979): At least 1 percent but less than 20 percent impaired, limited or restricted    Kerin RansomPatrick A Vanassa Penniman, PT, DPT    02/24/2016, 6:17 PM

## 2016-02-24 NOTE — Consult Note (Signed)
   [facility] CM Inpatient Consult   02/24/2016  Tima Curet 25-Sep-1946 [id number]  EPIC referral received, Met with the patient at bedside to offer [place] Management services as benefit of her health team advantage insurance. Patient agreeable to services and will receive post hospital discharge call and will be evaluated for monthly home visits. Mrs.Dowis confirms Dr. Frazier Richards as  PCP. Patient anticipates having home health services at discharge,  [facility] consent signed, inpatient case manager updated.  Patient provided nondiscrimination and accessibility form along with [facility] packet.   Of note, [facility] Care Management services does not replace or interfere with any services that are arranged by inpatient case management or social work. For additional questions or referrals please contact:  Joylene Draft, RN, [facility] Liaison  [phone]- Mobile [phone]- [place] Office

## 2016-02-24 NOTE — Patient Outreach (Signed)
[facility]) Care Management  02/24/2016  Jessica DanceBarbara Donovan March 31, 1946 [id number]   Subjective: Received voicemail from referral source [name], request call back on cell phone [phone].     Telephone call to referral source, have received verbal authorization from patient to speak with Ms. Green regarding patient healthcare needs as needed.     Spoke with Ms. Green and verified HIPAA.    States she spoke with patient earlier today and patient is currently in the hospital.   [facility] and Ms. Green voiced understanding.   RNCM advised Ms. Green patient has agreed to [facility] post hospitalization and [facility] RN[facility] will be following up with patient after discharge from hospital.   Ms. Chilton SiGreen states she is the Resident Service Coordinator at [facility] which is an independent [employer] 8 property.  States patient has had home care through [facility] in the past.   Advised acute RNCM will coordinate patient's home care needs prior to discharge and [facility] Liaison will follow up with patient prior to discharge to coordinate [facility] Transition of care follow up.    Objective: Per Epic case review: Patient hospitalized 01/23/16 - 01/27/16 for osteomyelitis of left 2nd toe.  Assessment: Received referral [name] at [facility] on 02/13/16. Reason for referral: Multiple hospital, home health, rehab stay in the last few months. Came home on Monday from rehab, has fallen, no family support, feels patient needs additional support. Anna phoned home health to follow up for physical therapy, occupational therapy, RN, and was advised they were not able to assist patient for multiple reasons. Contact [name] for additional details. Diagnosis: Congestive Heart Failure and Diabetes.  No further telephonic RNCM needs at this time.  [facility] for transition of care post hospital  discharge.  Plan: Patient will [facility] during inpatient admission.    Sora Olivo H. Gardiner Barefootooper RN, BSN, CCM [facility] Care [facility] Telephonic CM Phone: [phone] Fax: [phone]

## 2016-02-24 NOTE — Progress Notes (Signed)
Patient has stored $200 with Security, key in chart

## 2016-02-25 ENCOUNTER — Other Ambulatory Visit: Payer: Self-pay | Admitting: *Deleted

## 2016-02-25 DIAGNOSIS — M549 Dorsalgia, unspecified: Secondary | ICD-10-CM | POA: Diagnosis not present

## 2016-02-25 DIAGNOSIS — R296 Repeated falls: Secondary | ICD-10-CM | POA: Diagnosis not present

## 2016-02-25 DIAGNOSIS — R55 Syncope and collapse: Secondary | ICD-10-CM | POA: Diagnosis not present

## 2016-02-25 DIAGNOSIS — R5381 Other malaise: Secondary | ICD-10-CM | POA: Diagnosis not present

## 2016-02-25 DIAGNOSIS — E039 Hypothyroidism, unspecified: Secondary | ICD-10-CM | POA: Diagnosis not present

## 2016-02-25 LAB — THYROID PANEL WITH TSH
T3 UPTAKE RATIO: 24 % (ref 24–39)
TSH: 0.302 u[IU]/mL — AB (ref 0.450–4.500)

## 2016-02-25 LAB — GLUCOSE, CAPILLARY
GLUCOSE-CAPILLARY: 191 mg/dL — AB (ref 65–99)
Glucose-Capillary: 141 mg/dL — ABNORMAL HIGH (ref 65–99)

## 2016-02-25 MED ORDER — MORPHINE SULFATE ER 30 MG PO TBCR: 30.0000 mg | EXTENDED_RELEASE_TABLET | Freq: Two times a day (BID) | ORAL | Status: DC

## 2016-02-25 NOTE — Care Management (Signed)
Patient is for discharge home with [facility] PT OT Aide and SW.  Notified Abby with [facility].

## 2016-02-25 NOTE — Patient Outreach (Signed)
[facility]) Care Management  02/25/2016  Jessica DanceBarbara Donovan 31-Dec-1945 [id number]   Phone call to patient to schedule initial home visit to assess for social work needs.  HIPPA compliant voicemail message left for a return call.   Adriana ReamsChrystal Rosalea Withrow, LCSW [facility] Care Management [phone]

## 2016-02-25 NOTE — Progress Notes (Signed)
A&O. Slept well. Medicated during the night for pain. Laceration on right side of head with dried blood noted.

## 2016-02-25 NOTE — Discharge Summary (Signed)
[facility] - [place] at [facility]   PATIENT NAME: Jessica Donovan    MR#:  [id number]  DATE OF BIRTH:  Oct 19, 1946  DATE OF ADMISSION:  02/23/2016 ADMITTING PHYSICIAN: Alford Highland, MD  DATE OF DISCHARGE: 02/25/2016  1:35 PM  PRIMARY CARE PHYSICIAN: Lauro Regulus., MD    ADMISSION DIAGNOSIS:  Syncope and collapse [R55] Dizziness [R42] CVA (cerebral infarction) [I63.9] Fall with injury [T14.90, W19.XXXA] Scalp hematoma, initial encounter [S00.03XA]  DISCHARGE DIAGNOSIS:  Principal Problem:   Syncope and collapse Active Problems:   Multiple falls   Physical debility   DM2 (diabetes mellitus, type 2) (HCC)   Polypharmacy   SECONDARY DIAGNOSIS:   Past Medical History  Diagnosis Date  . Hypertension   . Diabetes mellitus without complication (HCC)   . Coronary artery disease   . Low back pain   . Stroke (HCC)   . Myocardial infarction (HCC)   . Chronic back pain   . Osteomyelitis of toe (HCC) 01/23/2016    HOSPITAL COURSE:   70 year old female with past medical history of chronic back pain, anxiety, diabetes, hypertension, history of previous CVA, history of osteomyelitis of the toe who presented to the hospital due to a syncopal episode.  #1 syncope/fall-this was due to polypharmacy. Patient's chronic pain meds have not been adjusted. -Patient was admitted to the hospital and underwent an extensive workup for her syncope clinically CT head, MRI/MRA of the brain, carotid duplex, echocardiogram all of which have been negative. He was also observed on telemetry and no evidence of acute arrhythmias. She is currently clinically stable ambulating without any acute evidence of syncope or falls. -She was seen by physical therapy that recommended home health services which is being arranged for her prior to discharge.  #2. Chronic back pain-patient's chronic pain meds were adjusted. Her MS Contin was reduced to 30 mg twice daily from 45 mg 3 times  daily. She will continue her short acting morphine as stated below. - she will cont. Flexaril.   #3 neuropathy - Pt. Will cont. His Lyrica.    #4 history of coronary artery disease-  Continue Brillinta, Imdur, ASA, Statin  #5. Hypothyroidism - pt. Will  cont. Cytomel.   #6 Anxiety - pt. Will cont. Xanax.    DISCHARGE CONDITIONS:   Stable.   CONSULTS OBTAINED:     DRUG ALLERGIES:   Allergies  Allergen Reactions  . Codeine Nausea And Vomiting  . Influenza Vaccines Other (See Comments)    Passed out  . Methadone Hives  . Percocet [Oxycodone-Acetaminophen] Nausea And Vomiting  . Tetanus Toxoids Swelling  . Tetracyclines & Related Rash    DISCHARGE MEDICATIONS:   Discharge Medication List as of 02/25/2016 10:22 AM    CONTINUE these medications which have CHANGED   Details  morphine (MS CONTIN) 30 MG 12 hr tablet Take 1 tablet (30 mg total) by mouth every 12 (twelve) hours., Starting 02/25/2016, Until Discontinued, Print      CONTINUE these medications which have NOT CHANGED   Details  ALPRAZolam (XANAX) 1 MG tablet Take 1 tablet (1 mg total) by mouth 3 (three) times daily as needed for anxiety., Starting 01/27/2016, Until Discontinued, Print    aspirin EC 81 MG tablet Take 81 mg by mouth daily., Until Discontinued, Historical Med    atorvastatin (LIPITOR) 20 MG tablet Take 20 mg by mouth daily., Until Discontinued, Historical Med    cyclobenzaprine (FLEXERIL) 10 MG tablet Take 1 tablet (10 mg total) by mouth 3 (three)  times daily as needed for muscle spasms., Starting 01/27/2016, Until Discontinued, Print    diphenoxylate-atropine (LOMOTIL) 2.5-0.025 MG tablet Take 1 tablet by mouth 4 (four) times daily as needed for diarrhea or loose stools., Until Discontinued, Historical Med    ergocalciferol (VITAMIN D2) 50000 UNITS capsule Take 50,000 Units by mouth once a week. Reported on 02/24/2016, Starting 11/10/2015, Until Discontinued, Historical Med    ferrous sulfate 325 (65 FE)  MG tablet Take 325 mg by mouth daily with breakfast., Until Discontinued, Historical Med    isosorbide mononitrate (IMDUR) 30 MG 24 hr tablet Take 30 mg by mouth daily. Reported on 12/07/2015, Until Discontinued, Historical Med    liothyronine (CYTOMEL) 25 MCG tablet Take 25 mcg by mouth daily., Until Discontinued, Historical Med    mirtazapine (REMERON) 15 MG tablet Take 15 mg by mouth at bedtime., Until Discontinued, Historical Med    morphine (MSIR) 15 MG tablet Take 1 tablet (15 mg total) by mouth every 6 (six) hours as needed for moderate pain or severe pain., Starting 01/27/2016, Until Discontinued, Print    nitrofurantoin, macrocrystal-monohydrate, (MACROBID) 100 MG capsule Take 100 mg by mouth daily., Until Discontinued, Historical Med    nitroGLYCERIN (NITROSTAT) 0.4 MG SL tablet Place 0.4 mg under the tongue every 5 (five) minutes as needed for chest pain., Until Discontinued, Historical Med    omeprazole (PRILOSEC) 20 MG capsule Take 20 mg by mouth daily., Until Discontinued, Historical Med    pregabalin (LYRICA) 75 MG capsule Take 1 capsule (75 mg total) by mouth 2 (two) times daily., Starting 11/12/2015, Until Discontinued, Print    solifenacin (VESICARE) 10 MG tablet Take 10 mg by mouth daily. , Until Discontinued, Historical Med    ticagrelor (BRILINTA) 90 MG TABS tablet Take 90 mg by mouth 2 (two) times daily., Until Discontinued, Historical Med    tolterodine (DETROL LA) 4 MG 24 hr capsule Take 4 mg by mouth daily., Until Discontinued, Historical Med    traZODone (DESYREL) 100 MG tablet Take 100 mg by mouth at bedtime as needed for sleep., Until Discontinued, Historical Med    venlafaxine XR (EFFEXOR-XR) 150 MG 24 hr capsule Take 150 mg by mouth daily with breakfast., Until Discontinued, Historical Med      STOP taking these medications     senna (SENOKOT) 8.6 MG TABS tablet          DISCHARGE INSTRUCTIONS:   DIET:  Cardiac diet and Diabetic diet  DISCHARGE  CONDITION:  Stable  ACTIVITY:  Activity as tolerated  OXYGEN:  Home Oxygen: No.   Oxygen Delivery: room air  DISCHARGE LOCATION:  Home with home health nursing, PT, OT, social worker, home health aide.   If you experience worsening of your admission symptoms, develop shortness of breath, life threatening emergency, suicidal or homicidal thoughts you must seek medical attention immediately by calling 911 or calling your MD immediately  if symptoms less severe.  You Must read complete instructions/literature along with all the possible adverse reactions/side effects for all the Medicines you take and that have been prescribed to you. Take any new Medicines after you have completely understood and accpet all the possible adverse reactions/side effects.   Please note  You were cared for by a hospitalist during your hospital stay. If you have any questions about your discharge medications or the care you received while you were in the hospital after you are discharged, you can call the unit and asked to speak with the hospitalist on call if the  hospitalist that took care of you is not available. Once you are discharged, your primary care physician will handle any further medical issues. Please note that NO REFILLS for any discharge medications will be authorized once you are discharged, as it is imperative that you return to your primary care physician (or establish a relationship with a primary care physician if you do not have one) for your aftercare needs so that they can reassess your need for medications and monitor your lab values.     Today   Further episodes of syncope. No chest pain, nausea, vomiting, dizziness or any other associated symptoms.  VITAL SIGNS:  Blood pressure 156/66, pulse 93, temperature 97.9 F (36.6 C), temperature source Oral, resp. rate 20, height 5\' 8"  (1.727 m), weight 75.932 kg (167 lb 6.4 oz), SpO2 93 %.  I/O:   Intake/Output Summary (Last 24 hours) at  02/25/16 1411 Last data filed at 02/25/16 1312  Gross per 24 hour  Intake    720 ml  Output   3300 ml  Net  -2580 ml    PHYSICAL EXAMINATION:  GENERAL:  70 y.o.-year-old patient lying in the bed with no acute distress.  EYES: Pupils equal, round, reactive to light and accommodation. No scleral icterus. Extraocular muscles intact.  HEENT: Pt. Has a scalp hematoma with no acute bleeding, normocephalic. Oropharynx and nasopharynx clear.  NECK:  Supple, no jugular venous distention. No thyroid enlargement, no tenderness.  LUNGS: Normal breath sounds bilaterally, no wheezing, rales,rhonchi. No use of accessory muscles of respiration.  CARDIOVASCULAR: S1, S2 normal. No murmurs, rubs, or gallops.  ABDOMEN: Soft, non-tender, non-distended. Bowel sounds present. No organomegaly or mass.  EXTREMITIES: No pedal edema, cyanosis, or clubbing.  NEUROLOGIC: Cranial nerves II through XII are intact. No focal motor or sensory defecits b/l.  PSYCHIATRIC: The patient is alert and oriented x 3.  SKIN: No obvious rash, lesion, or ulcer.   DATA REVIEW:   CBC  Recent Labs Lab 02/24/16 0613  WBC 6.7  HGB 11.6*  HCT 35.2  PLT 211    Chemistries   Recent Labs Lab 02/23/16 1517 02/24/16 0613  NA 140 141  K 4.2 4.3  CL 107 107  CO2 25 27  GLUCOSE 190* 180*  BUN 18 15  CREATININE 0.68 0.60  CALCIUM 9.2 9.0  AST 22  --   ALT 9*  --   ALKPHOS 74  --   BILITOT <0.1*  --     Cardiac Enzymes  Recent Labs Lab 02/23/16 1517  TROPONINI <0.03    Microbiology Results  Results for orders placed or performed during the hospital encounter of 02/23/16  MRSA PCR Screening     Status: None   Collection Time: 02/23/16 11:20 PM  Result Value Ref Range Status   MRSA by PCR NEGATIVE NEGATIVE Final    Comment:        The GeneXpert MRSA Assay (FDA approved for NASAL specimens only), is one component of a comprehensive MRSA colonization surveillance program. It is not intended to diagnose  MRSA infection nor to guide or monitor treatment for MRSA infections.     RADIOLOGY:  Ct Head Wo Contrast  02/23/2016  CLINICAL DATA:  Fall with trauma to back of head.  On blood thinner. EXAM: CT HEAD WITHOUT CONTRAST CT CERVICAL SPINE WITHOUT CONTRAST TECHNIQUE: Multidetector CT imaging of the head and cervical spine was performed following the standard protocol without intravenous contrast. Multiplanar CT image reconstructions of the cervical spine were also generated.  COMPARISON:  12/15/2014 FINDINGS: CT HEAD FINDINGS Sinuses/Soft tissues: Right parietal scalp soft tissue swelling is moderate-to-marked. Hypoplastic frontal sinuses. No skull fracture. Clear mastoid air cells. Intracranial: Mild cerebral and cerebellar atrophy. No mass lesion, hemorrhage, hydrocephalus, acute infarct, intra-axial, or extra-axial fluid collection. CT CERVICAL SPINE FINDINGS Spinal visualization through bottom of T1. Prevertebral soft tissues are within normal limits. Prevertebral soft tissues are within normal limits. No apical pneumothorax. Skull base intact. Maintenance of vertebral body height and alignment. Expected for age spondylosis, including at C4-5 and C5-6. Facets are well-aligned. Mild convex left curvature. Coronal reformats demonstrate a normal C1-C2 articulation. IMPRESSION: 1. Right parietal scalp soft tissue swelling, without acute intracranial abnormality. 2.  No acute fracture or subluxation within the cervical spine. Electronically Signed   By: Jeronimo GreavesKyle  Talbot M.D.   On: 02/23/2016 15:52   Ct Cervical Spine Wo Contrast  02/23/2016  CLINICAL DATA:  Fall with trauma to back of head.  On blood thinner. EXAM: CT HEAD WITHOUT CONTRAST CT CERVICAL SPINE WITHOUT CONTRAST TECHNIQUE: Multidetector CT imaging of the head and cervical spine was performed following the standard protocol without intravenous contrast. Multiplanar CT image reconstructions of the cervical spine were also generated. COMPARISON:   12/15/2014 FINDINGS: CT HEAD FINDINGS Sinuses/Soft tissues: Right parietal scalp soft tissue swelling is moderate-to-marked. Hypoplastic frontal sinuses. No skull fracture. Clear mastoid air cells. Intracranial: Mild cerebral and cerebellar atrophy. No mass lesion, hemorrhage, hydrocephalus, acute infarct, intra-axial, or extra-axial fluid collection. CT CERVICAL SPINE FINDINGS Spinal visualization through bottom of T1. Prevertebral soft tissues are within normal limits. Prevertebral soft tissues are within normal limits. No apical pneumothorax. Skull base intact. Maintenance of vertebral body height and alignment. Expected for age spondylosis, including at C4-5 and C5-6. Facets are well-aligned. Mild convex left curvature. Coronal reformats demonstrate a normal C1-C2 articulation. IMPRESSION: 1. Right parietal scalp soft tissue swelling, without acute intracranial abnormality. 2.  No acute fracture or subluxation within the cervical spine. Electronically Signed   By: Jeronimo GreavesKyle  Talbot M.D.   On: 02/23/2016 15:52   Mr Shirlee LatchMra Head Wo Contrast  02/24/2016  CLINICAL DATA:  70 year old female with syncope and fall striking the back of her head. Initial encounter. EXAM: MRI HEAD WITHOUT CONTRAST MRA HEAD WITHOUT CONTRAST TECHNIQUE: Multiplanar, multiecho pulse sequences of the brain and surrounding structures were obtained without intravenous contrast. Angiographic images of the head were obtained using MRA technique without contrast. COMPARISON:  Head and cervical spine CT 02/23/2016. FINDINGS: MRI HEAD FINDINGS Right posterior scalp hematoma re- demonstrated. Associated susceptibility artifact on diffusion imaging in this region. No restricted diffusion to suggest acute infarction. No midline shift, mass effect, evidence of mass lesion, ventriculomegaly, extra-axial collection or acute intracranial hemorrhage. Cervicomedullary junction and pituitary are within normal limits. Negative visualized cervical spine. Major  intracranial vascular flow voids are preserved. Wallace CullensGray and white matter signal is within normal limits for age throughout the brain. Visible internal auditory structures appear normal. Mastoids are clear. Mild paranasal sinus mucosal thickening. Postoperative changes to both globes. Normal bone marrow signal. MRA HEAD FINDINGS Antegrade flow in the posterior circulation, but diminutive vertebrobasilar system. This appears at least in part related to fetal type bilateral PCA origins. Both distal vertebral arteries are patent. Bilateral PICA/AICA origins appear patent. No basilar artery stenosis. SCA origins are patent. Fetal type bilateral PCA origins. Bilateral PCA branches are within normal limits. Antegrade flow in both ICA siphons. Supraclinoid ICA irregularity in keeping with atherosclerosis, with mild to moderate right supraclinoid stenosis best seen on  series 24, image 12. Both carotid termini remain patent. Ophthalmic and posterior communicating artery origins are within normal limits. Anterior communicating artery and visualized ACA branches are within normal limits. Mildly dolichoectasia attic left MCA M1 segment without stenosis. Left MCA bifurcation and visualized left MCA branches are within normal limits. Mild right M1 segment irregularity without stenosis. Right MCA bifurcation and branches are within normal limits. IMPRESSION: 1. No acute intracranial abnormality and negative for age noncontrast MRI appearance of the brain. 2. ICA siphon atherosclerosis with mild to moderate right ICA supraclinoid stenosis. Bilateral MCA irregularity may also indicate atherosclerosis. No other intracranial stenosis. 3. Right scalp hematoma. Electronically Signed   By: Odessa Fleming M.D.   On: 02/24/2016 10:28   Mr Brain Wo Contrast  02/24/2016  CLINICAL DATA:  70 year old female with syncope and fall striking the back of her head. Initial encounter. EXAM: MRI HEAD WITHOUT CONTRAST MRA HEAD WITHOUT CONTRAST TECHNIQUE:  Multiplanar, multiecho pulse sequences of the brain and surrounding structures were obtained without intravenous contrast. Angiographic images of the head were obtained using MRA technique without contrast. COMPARISON:  Head and cervical spine CT 02/23/2016. FINDINGS: MRI HEAD FINDINGS Right posterior scalp hematoma re- demonstrated. Associated susceptibility artifact on diffusion imaging in this region. No restricted diffusion to suggest acute infarction. No midline shift, mass effect, evidence of mass lesion, ventriculomegaly, extra-axial collection or acute intracranial hemorrhage. Cervicomedullary junction and pituitary are within normal limits. Negative visualized cervical spine. Major intracranial vascular flow voids are preserved. Wallace Cullens and white matter signal is within normal limits for age throughout the brain. Visible internal auditory structures appear normal. Mastoids are clear. Mild paranasal sinus mucosal thickening. Postoperative changes to both globes. Normal bone marrow signal. MRA HEAD FINDINGS Antegrade flow in the posterior circulation, but diminutive vertebrobasilar system. This appears at least in part related to fetal type bilateral PCA origins. Both distal vertebral arteries are patent. Bilateral PICA/AICA origins appear patent. No basilar artery stenosis. SCA origins are patent. Fetal type bilateral PCA origins. Bilateral PCA branches are within normal limits. Antegrade flow in both ICA siphons. Supraclinoid ICA irregularity in keeping with atherosclerosis, with mild to moderate right supraclinoid stenosis best seen on series 24, image 12. Both carotid termini remain patent. Ophthalmic and posterior communicating artery origins are within normal limits. Anterior communicating artery and visualized ACA branches are within normal limits. Mildly dolichoectasia attic left MCA M1 segment without stenosis. Left MCA bifurcation and visualized left MCA branches are within normal limits. Mild right M1  segment irregularity without stenosis. Right MCA bifurcation and branches are within normal limits. IMPRESSION: 1. No acute intracranial abnormality and negative for age noncontrast MRI appearance of the brain. 2. ICA siphon atherosclerosis with mild to moderate right ICA supraclinoid stenosis. Bilateral MCA irregularity may also indicate atherosclerosis. No other intracranial stenosis. 3. Right scalp hematoma. Electronically Signed   By: Odessa Fleming M.D.   On: 02/24/2016 10:28   US Carotid Bilateral  02/24/2016  CLINICAL DATA:  70 year old female with a history of cerebral vascular accident. Cardiovascular risk factors include hypertension, prior stroke/TIA, known coronary artery disease, hyperlipidemia, diabetes. EXAM: BILATERAL CAROTID DUPLEX ULTRASOUND TECHNIQUE: Wallace Cullens scale imaging, color Doppler and duplex ultrasound were performed of bilateral carotid and vertebral arteries in the neck. COMPARISON:  No prior duplex FINDINGS: Criteria: Quantification of carotid stenosis is based on velocity parameters that correlate the residual internal carotid diameter with NASCET-based stenosis levels, using the diameter of the distal internal carotid lumen as the denominator for stenosis measurement. The  following velocity measurements were obtained: RIGHT ICA:  Systolic 119 cm/sec, Diastolic 36 cm/sec CCA:  82 cm/sec SYSTOLIC ICA/CCA RATIO:  125 ECA:  131 cm/sec LEFT ICA:  Systolic 74 cm/sec, Diastolic 26 cm/sec CCA:  88 cm/sec SYSTOLIC ICA/CCA RATIO:  0.8 ECA:  156 cm/sec Right Brachial SBP: Not acquired Left Brachial SBP: Not acquired RIGHT CAROTID ARTERY: Minimal calcifications and atherosclerotic changes of the right common carotid artery. Intermediate waveform maintained. Heterogeneous and partially calcified plaque at the right carotid bifurcation. No significant lumen shadowing. Low resistance waveform of the right ICA. No significant tortuosity. RIGHT VERTEBRAL ARTERY: Antegrade flow with low resistance waveform.  LEFT CAROTID ARTERY: Minimal calcifications and atherosclerotic changes of the left common carotid artery. Intermediate waveform maintained. Heterogeneous and partially calcified plaque at the left carotid bifurcation without significant lumen shadowing. Low resistance waveform of the left ICA. No significant tortuosity. LEFT VERTEBRAL ARTERY:  Antegrade flow with low resistance waveform. IMPRESSION: Color duplex indicates minimal heterogeneous and calcified plaque, with no hemodynamically significant stenosis by duplex criteria in the extracranial cerebrovascular circulation. Signed, Yvone Neu. Loreta Ave, DO [facility] Electronically Signed   By: Gilmer Mor D.O.   On: 02/24/2016 11:03      Management plans discussed with the patient, family and they are in agreement.  CODE STATUS:     Code Status Orders        Start     Ordered   02/23/16 2253  Do not attempt resuscitation (DNR)   Continuous    Question Answer Comment  In the event of cardiac or respiratory ARREST Do not call a "code blue"   In the event of cardiac or respiratory ARREST Do not perform Intubation, CPR, defibrillation or ACLS   In the event of cardiac or respiratory ARREST Use medication by any route, position, wound care, and other measures to relive pain and suffering. May use oxygen, suction and manual treatment of airway obstruction as needed for comfort.      02/23/16 2252    Code Status History    Date Active Date Inactive Code Status Order ID Comments User Context   01/24/2016  3:54 PM 01/27/2016  7:06 PM DNR [id number]  Milagros Loll, MD Inpatient   01/23/2016  4:07 PM 01/23/2016  4:22 PM Full Code [id number]  Shaune Pollack, MD Inpatient   12/07/2015 10:23 PM 12/09/2015  6:58 PM Full Code [id number]  Auburn Bilberry, MD Inpatient   11/29/2015  5:41 PM 12/01/2015  6:33 PM DNR [id number]  Shaune Pollack, MD Inpatient   11/10/2015  9:54 AM 11/12/2015  9:39 PM DNR [id number]  Linus Galas, MD Inpatient   11/07/2015  5:32 PM 11/10/2015  9:54 AM DNR [id number]  Altamese Dilling, MD Inpatient   11/07/2015  3:49 PM 11/07/2015  5:32 PM Full Code [id number]  Altamese Dilling, MD Inpatient      TOTAL TIME TAKING CARE OF THIS PATIENT: 40 minutes.    Houston Siren M.D on 02/25/2016 at 2:11 PM  Between 7am to 6pm - Pager - [phone]  After 6pm go to [email or web address] - password [email or web address] [facility]  [place] [facility]  Office  [phone]  CC: Primary care physician; Lauro Regulus., MD

## 2016-02-25 NOTE — Progress Notes (Signed)
Discharge instructions explained to pt/ verbalized an understanding/ iv and tele removed/ belonging returned to pt by security / will transport off unit when ride arrives around 2pm.

## 2016-02-26 ENCOUNTER — Other Ambulatory Visit: Payer: Self-pay | Admitting: *Deleted

## 2016-02-26 DIAGNOSIS — I25119 Atherosclerotic heart disease of native coronary artery with unspecified angina pectoris: Secondary | ICD-10-CM | POA: Diagnosis not present

## 2016-02-26 DIAGNOSIS — M5442 Lumbago with sciatica, left side: Secondary | ICD-10-CM | POA: Diagnosis not present

## 2016-02-26 DIAGNOSIS — M5441 Lumbago with sciatica, right side: Secondary | ICD-10-CM | POA: Diagnosis not present

## 2016-02-26 DIAGNOSIS — E1140 Type 2 diabetes mellitus with diabetic neuropathy, unspecified: Secondary | ICD-10-CM | POA: Diagnosis not present

## 2016-02-26 DIAGNOSIS — F419 Anxiety disorder, unspecified: Secondary | ICD-10-CM | POA: Diagnosis not present

## 2016-02-26 DIAGNOSIS — R55 Syncope and collapse: Secondary | ICD-10-CM | POA: Diagnosis not present

## 2016-02-26 DIAGNOSIS — I10 Essential (primary) hypertension: Secondary | ICD-10-CM | POA: Diagnosis not present

## 2016-02-26 DIAGNOSIS — S0003XD Contusion of scalp, subsequent encounter: Secondary | ICD-10-CM | POA: Diagnosis not present

## 2016-02-26 DIAGNOSIS — M6281 Muscle weakness (generalized): Secondary | ICD-10-CM | POA: Diagnosis not present

## 2016-02-26 DIAGNOSIS — R296 Repeated falls: Secondary | ICD-10-CM | POA: Diagnosis not present

## 2016-02-26 NOTE — Patient Outreach (Signed)
Attempt made to contact pt for transition of care (week 1, discharged 3/7).  HIPPA compliant voice message left with contact number.  If no response, will try again.    [name] M.   Lacora Folmer RN CCM [facility]  [phone]

## 2016-02-27 ENCOUNTER — Other Ambulatory Visit: Payer: Self-pay | Admitting: *Deleted

## 2016-02-28 ENCOUNTER — Other Ambulatory Visit: Payer: Self-pay | Admitting: *Deleted

## 2016-02-28 ENCOUNTER — Encounter: Payer: Self-pay | Admitting: *Deleted

## 2016-02-28 NOTE — Patient Outreach (Signed)
Transition of care call (week 1, discharged 3/7).  Spoke with pt, HIPPA verified.  Pt reports received a call to f/u with Dr. Dareen PianoAnderson tomorrow, rescheduled - does not have date available.  Pt reports HH RN started, can't wait for aide to come, need blood in hair washed.  Pt reports she was told by MD in the hospital hematoma (result of fall) - big as a baseball, now much smaller, bleeding very little.  Pt reports prior to hospitalization was able to fill her own pill planner but eyesight now compromised (eye disease) so HH RN filled her pill planner, to continue for next 8 weeks.  Pt reports she told HH PT to start next week.   Discussed with pt transition of care program- f/u with weekly phone calls  31 days post day of discharge.  Also discussed doing a joint home visit with [facility] CSW  3/20 to which pt agreed.    Plan to f/u with pt 3/20- joint home visit with [place] CS[facility].   Plan to [facility]Anderson [facility] - fax barrier letter in Epic.

## 2016-02-28 NOTE — Patient Outreach (Signed)
[facility](THN) Care Management  02/28/2016  Jessica DanceBarbara Donovan May 22, 1946 [id number]   Phone call to patient to schedule initial home visit.  Voicemail message left for a return call.   EPIC notes reviewed.  RNCM has made contact with patient and co-visit is scheduled for 03/09/16 at 10:00am.   Adriana ReamsChrystal Land, LCSW [facility] Care Management [phone]

## 2016-03-04 DIAGNOSIS — B351 Tinea unguium: Secondary | ICD-10-CM | POA: Diagnosis not present

## 2016-03-04 DIAGNOSIS — L851 Acquired keratosis [keratoderma] palmaris et plantaris: Secondary | ICD-10-CM | POA: Diagnosis not present

## 2016-03-04 DIAGNOSIS — E1140 Type 2 diabetes mellitus with diabetic neuropathy, unspecified: Secondary | ICD-10-CM | POA: Diagnosis not present

## 2016-03-04 DIAGNOSIS — L97511 Non-pressure chronic ulcer of other part of right foot limited to breakdown of skin: Secondary | ICD-10-CM | POA: Diagnosis not present

## 2016-03-06 ENCOUNTER — Other Ambulatory Visit: Payer: Self-pay | Admitting: *Deleted

## 2016-03-06 NOTE — Patient Outreach (Signed)
Transition of care call (week 2, discharged 3/7).  Pt reports doing good, except not getting strength back as quick as wants.  Pt reports HH PT to call 3/20, set up time to see her, HH Aide washed hair.   Pt reports Cresenciano Genrenne Green to be present during upcoming home visit 3/20(joint visit to be done with Chyrstal LSW).   Pt reports no recent falls, using rollator.  Pt reports still filling pill planner, coming weekly.     Plan to f/u with pt 3/20 - joint home visit to be done with Chrystal LSW.    [name] M.   Pierzchala RN CCM [facility] Care Management  [phone]

## 2016-03-09 ENCOUNTER — Other Ambulatory Visit: Payer: Self-pay | Admitting: *Deleted

## 2016-03-09 ENCOUNTER — Other Ambulatory Visit: Payer: Self-pay | Admitting: Pharmacist

## 2016-03-09 DIAGNOSIS — S0003XD Contusion of scalp, subsequent encounter: Secondary | ICD-10-CM | POA: Diagnosis not present

## 2016-03-09 DIAGNOSIS — M5442 Lumbago with sciatica, left side: Secondary | ICD-10-CM | POA: Diagnosis not present

## 2016-03-09 DIAGNOSIS — M6281 Muscle weakness (generalized): Secondary | ICD-10-CM | POA: Diagnosis not present

## 2016-03-09 DIAGNOSIS — R55 Syncope and collapse: Secondary | ICD-10-CM | POA: Diagnosis not present

## 2016-03-09 DIAGNOSIS — I25119 Atherosclerotic heart disease of native coronary artery with unspecified angina pectoris: Secondary | ICD-10-CM | POA: Diagnosis not present

## 2016-03-09 DIAGNOSIS — F419 Anxiety disorder, unspecified: Secondary | ICD-10-CM | POA: Diagnosis not present

## 2016-03-09 DIAGNOSIS — I10 Essential (primary) hypertension: Secondary | ICD-10-CM | POA: Diagnosis not present

## 2016-03-09 DIAGNOSIS — E1140 Type 2 diabetes mellitus with diabetic neuropathy, unspecified: Secondary | ICD-10-CM | POA: Diagnosis not present

## 2016-03-09 DIAGNOSIS — R296 Repeated falls: Secondary | ICD-10-CM | POA: Diagnosis not present

## 2016-03-09 DIAGNOSIS — M5441 Lumbago with sciatica, right side: Secondary | ICD-10-CM | POA: Diagnosis not present

## 2016-03-09 NOTE — Patient Outreach (Signed)
[facility](THN) Care Management  03/09/2016  Jessica DanceBarbara Govea 21-Jul-1946 [id number]   Jessica DanceBarbara Donovan is a 69yo [facility] due to decreased vision.  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  Patient reports having a hard time seeing her pills and putting them in the pill box.  I made initial outreach call to patient.  Patient reports she had several visitors today and is tired.  Patient had [facility] C[facility] Jodi Mourningose Pierzchala and [facility] Licensed Clinical Social Worker Toll BrothersChrystal Land.  Patient also reports Christina with [facility] made a visit today and filled her pill box for the next week.  Home health has orders to assist with pill box fills for the next 8 weeks.  I discussed scheduling a home visit to review her medications.  Patient reports she is not available this week.  Home visit scheduled for 03/16/16 at 1:00 PM.     Lilla Shookachel Michaelyn Wall, Pharm.D. Pharmacy Resident [facility] [phone]

## 2016-03-09 NOTE — Patient Outreach (Signed)
[facility]) Care Management   03/09/2016  Jessica Donovan 1946/02/24 [id number]  Jessica Donovan is an 70 y.o. female  Subjective:  Pt reports dealing with a headache today, has a hx of headaches, Tylenol helps.   Pt reports having chest pain once since discharge,  took 2 Nitroglycerin tablets with relief.  Pt  Reports to f/u with Dr. Dareen Donovan 3/28, need to reschedule appointment with Dr. Darrold Donovan.  Pt reports  No falls since discharge, last fall passed out (syncope per Epic)- resulted in hospitalization.   Objective:   Filed Vitals:   03/09/16 1028  BP: 122/62  Pulse: 76  Resp: 20    ROS  Physical Exam  Constitutional: She is oriented to person, place, and time. She appears well-developed and well-nourished.  Cardiovascular: Normal rate, regular rhythm and normal heart sounds.   Respiratory: Effort normal and breath sounds normal.  GI: Soft. Bowel sounds are normal.  Neurological: She is alert and oriented to person, place, and time.  Skin: Skin is warm.  Sores on second toe/fifth toe right foot.    Psychiatric: She has a normal mood and affect. Her behavior is normal. Judgment and thought content normal.  sores on right foot healing.    Current Medications:   Current Outpatient Prescriptions  Medication Sig Dispense Refill  . amLODipine (NORVASC) 5 MG tablet Take 5 mg by mouth daily.    [name] aspirin EC 81 MG tablet Take 81 mg by mouth daily.    [name] atorvastatin (LIPITOR) 20 MG tablet Take 20 mg by mouth daily.    . cyclobenzaprine (FLEXERIL) 10 MG tablet Take 1 tablet (10 mg total) by mouth 3 (three) times daily as needed for muscle spasms. 20 tablet 0  . ergocalciferol (VITAMIN D2) 50000 UNITS capsule Take 50,000 Units by mouth once a week. Reported on 02/24/2016    . ferrous sulfate 325 (65 FE) MG tablet Take 325 mg by mouth daily with breakfast.    . isosorbide mononitrate (IMDUR) 30 MG 24 hr tablet Take 30 mg by mouth daily. Reported on 12/07/2015    .  liothyronine (CYTOMEL) 25 MCG tablet Take 25 mcg by mouth daily.    [name] lisinopril (PRINIVIL,ZESTRIL) 20 MG tablet Take 20 mg by mouth daily.    . metoprolol tartrate (LOPRESSOR) 25 MG tablet Take 25 mg by mouth 2 (two) times daily. 2 tablets twice a day    . mirtazapine (REMERON) 15 MG tablet Take 15 mg by mouth at bedtime.    [name] morphine (MS CONTIN) 30 MG 12 hr tablet Take 1 tablet (30 mg total) by mouth every 12 (twelve) hours. 30 tablet 0  . morphine (MSIR) 15 MG tablet Take 1 tablet (15 mg total) by mouth every 6 (six) hours as needed for moderate pain or severe pain. 30 tablet 0  . nitrofurantoin, macrocrystal-monohydrate, (MACROBID) 100 MG capsule Take 100 mg by mouth daily.    . nitroGLYCERIN (NITROSTAT) 0.4 MG SL tablet Place 0.4 mg under the tongue every 5 (five) minutes as needed for chest pain.    [name] omeprazole (PRILOSEC) 20 MG capsule Take 20 mg by mouth daily.    . pregabalin (LYRICA) 75 MG capsule Take 1 capsule (75 mg total) by mouth 2 (two) times daily. 60 capsule 0  . solifenacin (VESICARE) 10 MG tablet Take 10 mg by mouth daily. Reported on 03/09/2016    . ticagrelor (BRILINTA) 90 MG TABS tablet Take 90 mg by mouth 2 (two) times daily.    [name]  tolterodine (DETROL LA) 4 MG 24 hr capsule Take 4 mg by mouth daily.    . traZODone (DESYREL) 100 MG tablet Take 100 mg by mouth at bedtime as needed for sleep.    [name]. venlafaxine XR (EFFEXOR-XR) 150 MG 24 hr capsule Take 150 mg by mouth daily with breakfast.    . ALPRAZolam (XANAX) 1 MG tablet Take 1 tablet (1 mg total) by mouth 3 (three) times daily as needed for anxiety. 20 tablet 0  . diphenoxylate-atropine (LOMOTIL) 2.5-0.025 MG tablet Take 1 tablet by mouth 4 (four) times daily as needed for diarrhea or loose stools.     No current facility-administered medications for this visit.    Functional Status:   In your present state of health, do you have any difficulty performing the following activities: 03/09/2016 02/23/2016  Hearing? N N   Vision? Y N  Difficulty concentrating or making decisions? Y N  Walking or climbing stairs? Y Y  Dressing or bathing? Y N  Doing errands, shopping? Malvin JohnsY Y  Preparing Food and eating ? Y -  Using the Toilet? N -  In the past six months, have you accidently leaked urine? N -  Do you have problems with loss of bowel control? N -  Managing your Medications? Y -  Managing your Finances? Y -  Housekeeping or managing your Housekeeping? N -    Fall/Depression Screening:    PHQ 2/9 Scores 03/09/2016 02/21/2016  PHQ - 2 Score 2 1  PHQ- 9 Score 4 6    Assessment:   Pt [facility]   Pt receives support from Gaetano NetAnna Donovan, resident Company secretaryservice coordinator.  HH RN fills pt's weekly pill planner- as reported pt has rare eye disease making it difficult to manage medications.  Pt needs help with medication [facility].                            DM- sugar today 147.  7 day- 133, 14 day- 139.  Ranges 63-209.                            Safety- several rugs slippery, pad needed underneath to eliminate fall risk Jessica Donovan(Jessica to look into                                    Getting pad).    Plan:  Plan to continue to follow pt for transition of care, f/u again telephonically 3/27.             Pt to f/u with Dr. Dareen PianoAnderson 3/28.             Plan to route  today's encounter ( fax in Epic) to Dr. Dareen PianoAnderson.    [facility] CM Care Plan Problem One        Most Recent Value   Care Plan Problem One  Risk for readmission related to recent hospitalization - hematoma    Role Documenting the Problem One  Care Management Coordinator   Care Plan for Problem One  Active   [facility] Long Term Goal (31-90 days)  Pt would not readmit 31 days from day of discharge    [facility] Start Date  02/27/16   Interventions for Problem One Long Term Goal  home visit done 03/09/16  [facility] CM Short Term Goal #1 (0-30 days)  Pt would not have recurrent falll in the next 30 days    [facility] CM Short Term Goal #1 Start Date   02/27/16   Interventions for Short Term Goal #1  Discussed placing pad under rugs to avoid falls.      Shayne Alken.   Pierzchala RN CCM [facility] Care Management  [phone]

## 2016-03-09 NOTE — Patient Outreach (Signed)
[facility]) Care Management  [facility] Social Work  03/09/2016  Jessica Donovan 11-Jun-1946 [id number]  Subjective:  Patient is a 69 year old female, currently resides in Independent living.  Objective:   Current Medications:  Current Outpatient Prescriptions  Medication Sig Dispense Refill  . ALPRAZolam (XANAX) 1 MG tablet Take 1 tablet (1 mg total) by mouth 3 (three) times daily as needed for anxiety. 20 tablet 0  . amLODipine (NORVASC) 5 MG tablet Take 5 mg by mouth daily.    [name] aspirin EC 81 MG tablet Take 81 mg by mouth daily.    [name] atorvastatin (LIPITOR) 20 MG tablet Take 20 mg by mouth daily.    . cyclobenzaprine (FLEXERIL) 10 MG tablet Take 1 tablet (10 mg total) by mouth 3 (three) times daily as needed for muscle spasms. 20 tablet 0  . diphenoxylate-atropine (LOMOTIL) 2.5-0.025 MG tablet Take 1 tablet by mouth 4 (four) times daily as needed for diarrhea or loose stools.    . ergocalciferol (VITAMIN D2) 50000 UNITS capsule Take 50,000 Units by mouth once a week. Reported on 02/24/2016    . ferrous sulfate 325 (65 FE) MG tablet Take 325 mg by mouth daily with breakfast.    . isosorbide mononitrate (IMDUR) 30 MG 24 hr tablet Take 30 mg by mouth daily. Reported on 12/07/2015    . liothyronine (CYTOMEL) 25 MCG tablet Take 25 mcg by mouth daily.    [name] lisinopril (PRINIVIL,ZESTRIL) 20 MG tablet Take 20 mg by mouth daily.    . metoprolol tartrate (LOPRESSOR) 25 MG tablet Take 25 mg by mouth 2 (two) times daily. 2 tablets twice a day    . mirtazapine (REMERON) 15 MG tablet Take 15 mg by mouth at bedtime.    [name] morphine (MS CONTIN) 30 MG 12 hr tablet Take 1 tablet (30 mg total) by mouth every 12 (twelve) hours. 30 tablet 0  . morphine (MSIR) 15 MG tablet Take 1 tablet (15 mg total) by mouth every 6 (six) hours as needed for moderate pain or severe pain. 30 tablet 0  . nitrofurantoin, macrocrystal-monohydrate, (MACROBID) 100 MG capsule Take 100 mg by mouth daily.    . nitroGLYCERIN  (NITROSTAT) 0.4 MG SL tablet Place 0.4 mg under the tongue every 5 (five) minutes as needed for chest pain.    [name] omeprazole (PRILOSEC) 20 MG capsule Take 20 mg by mouth daily.    . pregabalin (LYRICA) 75 MG capsule Take 1 capsule (75 mg total) by mouth 2 (two) times daily. 60 capsule 0  . solifenacin (VESICARE) 10 MG tablet Take 10 mg by mouth daily.     . ticagrelor (BRILINTA) 90 MG TABS tablet Take 90 mg by mouth 2 (two) times daily.    [name] tolterodine (DETROL LA) 4 MG 24 hr capsule Take 4 mg by mouth daily.    . traZODone (DESYREL) 100 MG tablet Take 100 mg by mouth at bedtime as needed for sleep.    [name] venlafaxine XR (EFFEXOR-XR) 150 MG 24 hr capsule Take 150 mg by mouth daily with breakfast.     No current facility-administered medications for this visit.    Functional Status:  In your present state of health, do you have any difficulty performing the following activities: 03/09/2016 02/23/2016  Hearing? N N  Vision? Y N  Difficulty concentrating or making decisions? Y N  Walking or climbing stairs? Y Y  Dressing or bathing? Y N  Doing errands, shopping? Jessica Donovan  Preparing Food and eating ?  Y -  Using the Toilet? N -  In the past six months, have you accidently leaked urine? N -  Do you have problems with loss of bowel control? N -  Managing your Medications? Y -  Managing your Finances? Y -  Housekeeping or managing your Housekeeping? N -    Fall/Depression Screening:  PHQ 2/9 Scores 03/09/2016 02/21/2016  PHQ - 2 Score 2 1  PHQ- 9 Score 4 6    Assessment:  Initial home visit to patient's home.  RNCM Jodi Mourningose Pierzchala, and resident coordinator [name] present during the visit.  Patient resides in a one bedroom apartment [facility] [address] [facility].  Patient receives Meals on Wheels, has  med alert for emergencies and utilizes [employer] and a small group of friends  for  transportation to medical appointments.   Patient reports having no family support,  has 2 other people besides the resident coordinator [name] to assist her with running errands and  getting her to appointments.  However patient needs consistent help with medications and personal care.  Home health is still involved-[facility], patient concerned  co-pay.   Personal care services discussed to increase consistent in home support.  Plan: This Child psychotherapistsocial worker will look into alternative options regarding her  medications to  (bubble packs possibly) and a private duty aid for consistent in home support.  This Child psychotherapistsocial worker will also look into  [facility] .    [facility] CM Care Plan Problem One        Most Recent Value   Care Plan Problem One  needs back up support system   Role Documenting the Problem One  Clinical Social Worker   Care Plan for Problem One  Active   [facility] CM Short Term Goal #1 (0-30 days)  patient will have a private duty aid in place within 30 days   [facility] CM Short Term Goal #1 Start Date  03/09/16   Interventions for Short Term Goal #1  discussed the importance  of consistent in home support     [name], LCSW [facility] Care Management [phone]

## 2016-03-10 ENCOUNTER — Encounter: Payer: Self-pay | Admitting: *Deleted

## 2016-03-10 DIAGNOSIS — I25119 Atherosclerotic heart disease of native coronary artery with unspecified angina pectoris: Secondary | ICD-10-CM | POA: Diagnosis not present

## 2016-03-10 DIAGNOSIS — E1140 Type 2 diabetes mellitus with diabetic neuropathy, unspecified: Secondary | ICD-10-CM | POA: Diagnosis not present

## 2016-03-11 DIAGNOSIS — M5442 Lumbago with sciatica, left side: Secondary | ICD-10-CM | POA: Diagnosis not present

## 2016-03-11 DIAGNOSIS — N390 Urinary tract infection, site not specified: Secondary | ICD-10-CM | POA: Diagnosis not present

## 2016-03-11 DIAGNOSIS — M5441 Lumbago with sciatica, right side: Secondary | ICD-10-CM | POA: Diagnosis not present

## 2016-03-11 DIAGNOSIS — Z955 Presence of coronary angioplasty implant and graft: Secondary | ICD-10-CM | POA: Diagnosis not present

## 2016-03-11 DIAGNOSIS — G8929 Other chronic pain: Secondary | ICD-10-CM | POA: Diagnosis not present

## 2016-03-11 DIAGNOSIS — E1165 Type 2 diabetes mellitus with hyperglycemia: Secondary | ICD-10-CM | POA: Diagnosis not present

## 2016-03-11 DIAGNOSIS — I259 Chronic ischemic heart disease, unspecified: Secondary | ICD-10-CM | POA: Diagnosis not present

## 2016-03-11 DIAGNOSIS — E1149 Type 2 diabetes mellitus with other diabetic neurological complication: Secondary | ICD-10-CM | POA: Diagnosis not present

## 2016-03-11 DIAGNOSIS — F411 Generalized anxiety disorder: Secondary | ICD-10-CM | POA: Diagnosis not present

## 2016-03-11 DIAGNOSIS — N3281 Overactive bladder: Secondary | ICD-10-CM | POA: Diagnosis not present

## 2016-03-11 DIAGNOSIS — F3341 Major depressive disorder, recurrent, in partial remission: Secondary | ICD-10-CM | POA: Diagnosis not present

## 2016-03-11 DIAGNOSIS — E1142 Type 2 diabetes mellitus with diabetic polyneuropathy: Secondary | ICD-10-CM | POA: Diagnosis not present

## 2016-03-16 ENCOUNTER — Other Ambulatory Visit: Payer: Self-pay | Admitting: *Deleted

## 2016-03-16 ENCOUNTER — Other Ambulatory Visit: Payer: Self-pay | Admitting: Pharmacist

## 2016-03-16 ENCOUNTER — Ambulatory Visit: Payer: Self-pay | Admitting: *Deleted

## 2016-03-16 DIAGNOSIS — E1140 Type 2 diabetes mellitus with diabetic neuropathy, unspecified: Secondary | ICD-10-CM | POA: Diagnosis not present

## 2016-03-16 DIAGNOSIS — R55 Syncope and collapse: Secondary | ICD-10-CM | POA: Diagnosis not present

## 2016-03-16 DIAGNOSIS — R296 Repeated falls: Secondary | ICD-10-CM | POA: Diagnosis not present

## 2016-03-16 NOTE — Patient Outreach (Addendum)
Received a call from [name] (pt's HCPOA, [facility]), reporting pt's phone not working, will not be able to reach her today telephonically, plan to take her for a replacement.   [name] reports [facility] 3 pm today to which RN CM will attempt to call her during that time (transition of care- discharged 3/7).  Plan to send an in basket to [name] pharmac[facility] informing of RN CM calling during her visit today.    [name] M.   Pierzchala RN CCM [facility] Care Management  [phone]

## 2016-03-16 NOTE — Patient Outreach (Signed)
Transition of care call-  Received a call from [name], currently in pt's home, as requested connected this RN CM to pt as pt's phone was not working earlier.  Pt reports having more pain than usual today (back,lower legs), Tylenol helps. Pt reports keeps a headache.   Pt reports [name] is suppose to pick some pads to put under her rugs (fall risk).  Pt reports appetite down.   Pt inquired who is going to order her medications (eyesight compromised), pill box filled for this week, will run out 4/2.    Discussed with pt to address her medication concerns with [name].    Plan to f/u with pt again telephonically 4/7- final transition of care call.    [name] M.   Pierzchala RN CCM [facility] Care Management  [phone]

## 2016-03-16 NOTE — Patient Outreach (Signed)
[facility]) Care Management  [facility] CM Pharmacy   03/16/2016  Tuesday Terlecki [birth year] [id number]  Subjective: Jessica Donovan is a 70yo who was referred to [facility] CM Pharmacy for medication management due to decreased vision.  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  Patient reports having a hard time seeing her pills and putting them in the pill box.  I made initial home visit today with PharmD Candidate Katheren Puller.    Patient reports her pill box was recently refilled by a home health nurse.    Patient reports she has an appointment with her PCP, Dr. Dareen Piano, on 03/17/16 at 2:30 PM.    Objective:  Blood pressure = 136/80 Pulse = 68 Blood glucose readings:  3/27 158 mg/dL 4/09 811 mg/dL 9/14 782 mg/dL 9/56 213 mg/dL 0/86 578 mg/dL 4/69 94 mg/dL 6/29 528 mg/dL 4/13 244 mg/dL  Current Medications: Current Outpatient Prescriptions  Medication Sig Dispense Refill  . acetaminophen (TYLENOL) 500 MG tablet Take 1,000 mg by mouth 3 (three) times daily as needed.    . ALPRAZolam (XANAX) 1 MG tablet Take 1 tablet (1 mg total) by mouth 3 (three) times daily as needed for anxiety. 20 tablet 0  . amLODipine (NORVASC) 5 MG tablet Take 5 mg by mouth daily.    [name] aspirin EC 81 MG tablet Take 81 mg by mouth daily.    [name] atorvastatin (LIPITOR) 20 MG tablet Take 20 mg by mouth daily.    . Biotin 5 MG CAPS Take 1 capsule by mouth daily.    . cetirizine (ZYRTEC) 10 MG tablet Take 10 mg by mouth daily as needed for allergies.    . cyanocobalamin 1000 MCG tablet Take 1,000 mcg by mouth daily.    . cyclobenzaprine (FLEXERIL) 10 MG tablet Take 1 tablet (10 mg total) by mouth 3 (three) times daily as needed for muscle spasms. 20 tablet 0  . diphenoxylate-atropine (LOMOTIL) 2.5-0.025 MG tablet Take 1 tablet by mouth 4 (four) times daily as needed for diarrhea or loose stools.    . ergocalciferol (VITAMIN D2) 50000 UNITS capsule Take 50,000 Units by mouth once  a week. Reported on 02/24/2016    . ferrous sulfate 325 (65 FE) MG tablet Take 325 mg by mouth daily with breakfast.    . Insulin Glargine 300 UNIT/ML SOPN Inject 50 Units into the skin daily. Sliding scale    . isosorbide mononitrate (IMDUR) 30 MG 24 hr tablet Take 30 mg by mouth daily. Reported on 12/07/2015    . lisinopril (PRINIVIL,ZESTRIL) 20 MG tablet Take 20 mg by mouth daily.    . metoprolol tartrate (LOPRESSOR) 25 MG tablet Take 25 mg by mouth 2 (two) times daily. 2 tablets twice a day    . mirtazapine (REMERON) 15 MG tablet Take 15 mg by mouth at bedtime.    [name] morphine (MS CONTIN) 30 MG 12 hr tablet Take 1 tablet (30 mg total) by mouth every 12 (twelve) hours. (Patient taking differently: Take 30 mg by mouth 3 (three) times daily. ) 30 tablet 0  . morphine (MSIR) 15 MG tablet Take 1 tablet (15 mg total) by mouth every 6 (six) hours as needed for moderate pain or severe pain. 30 tablet 0  . Multiple Vitamin (MULTIVITAMIN) capsule Take 1 capsule by mouth daily.    . nitrofurantoin, macrocrystal-monohydrate, (MACROBID) 100 MG capsule Take 100 mg by mouth daily.    . nitroGLYCERIN (NITROSTAT) 0.4 MG SL tablet Place  0.4 mg under the tongue every 5 (five) minutes as needed for chest pain.    [name]. omeprazole (PRILOSEC) 20 MG capsule Take 20 mg by mouth daily.    . ticagrelor (BRILINTA) 90 MG TABS tablet Take 90 mg by mouth 2 (two) times daily.    [name]. tolterodine (DETROL LA) 4 MG 24 hr capsule Take 4 mg by mouth daily.    . traZODone (DESYREL) 100 MG tablet Take 100 mg by mouth at bedtime as needed for sleep.    [name]. venlafaxine XR (EFFEXOR-XR) 150 MG 24 hr capsule Take 150 mg by mouth daily with breakfast.    . liothyronine (CYTOMEL) 25 MCG tablet Take 25 mcg by mouth daily. Reported on 03/16/2016    . pregabalin (LYRICA) 75 MG capsule Take 1 capsule (75 mg total) by mouth 2 (two) times daily. (Patient not taking: Reported on 03/16/2016) 60 capsule 0   No current facility-administered medications for this  visit.   Functional Status: In your present state of health, do you have any difficulty performing the following activities: 03/09/2016 02/23/2016  Hearing? N N  Vision? Y N  Difficulty concentrating or making decisions? Y N  Walking or climbing stairs? Y Y  Dressing or bathing? Y N  Doing errands, shopping? Malvin JohnsY Y  Preparing Food and eating ? Y -  Using the Toilet? N -  In the past six months, have you accidently leaked urine? N -  Do you have problems with loss of bowel control? N -  Managing your Medications? Y -  Managing your Finances? Y -  Housekeeping or managing your Housekeeping? N -   Fall/Depression Screening: PHQ 2/9 Scores 03/09/2016 02/21/2016  PHQ - 2 Score 2 1  PHQ- 9 Score 4 6    Assessment: 1.  Medication management:  I reviewed patient's medications with her and discussed purpose and proper use.  There were several discrepancies in patient's medications.  Patient is currently taking metoprolol tartrate 25 mg BID instead of prescribed dose of 50 mg BID (Blood pressure = 136/80, HR 68).  She reports she self-reduced her metoprolol dose.  Patient reports she is also using insulin glargine (Tuojeo) 50 units if her blood glucose is >140 mg/dL in the morning but reports she does not take Tuojeo if BG < 140 mg/dL.  In addition to [facility], she uses a sliding scale for Novolog (BG <140 = 0 units; 140-180 = 3 units; 181-220 = 4 units; 221-260 = 6 units; 261-320 = 8 units; 321-360 = 10 units; 361 - 400 = 12 units; > 400 = 15 units; >450 = call PCP).  Per most recent PCP visit in December 2016, patient was prescribed Tuojeo 60 units daily and Novolog 70/30 mix sliding scale BID.  Patient reports she is not using Novolog 70/30 mix.  I counseled patient on purpose, proper use, and adverse effects of basal and bolus insulin.  Lastly, patient reports currently taking morphine ER 30 mg TID instead of dose prescribed at hospital discharge (30mg  BID).  Patient reports her provider at the pain clinic,  Dr. Charlton HawsMcConnell, increased her dose at visit following hospital discharge.  I called [facility] to confirm most recently prescribed morphine ER dose and spoke to PiedmontJazmine.  Jazmine confirms patient was prescribed morphine ER 30mg  TID on 03/03/16 by Dr. Charlton HawsMcConnell.    Also discussed with patient long-term plan for medication management as home health will only be involved in helping patient fill her pill box for 7 more weeks.  Discussed use of [facility] which is a mail order pharmacy that contracts with patient's insurance and will package patient's medications in blister packs.  Patient reports she is interested in filling her prescriptions as 90-day supplies instead of 30-day supplies to take advantage of reduced copay amounts for 90-day supply of medications.  I spoke to a representative at [facility] who reports they cannot fill medications for 90-day supply.  I will research local pharmacies to determine if any pharmacies can fill 90-day supplies of medications and pill package medications for the patient.    Plan: 1.  Patient to attend PCP visit on 03/17/16 at 2:30 PM.  Will meet patient at PCP visit to discuss medication discrepancies.   2.  I will research local pharmacies that pill package medications and will discuss this with the patient at PCP visit.     Lilla Shook, Pharm.D. Pharmacy Resident [facility] [phone]

## 2016-03-17 ENCOUNTER — Other Ambulatory Visit: Payer: Self-pay | Admitting: Pharmacist

## 2016-03-17 DIAGNOSIS — E1140 Type 2 diabetes mellitus with diabetic neuropathy, unspecified: Secondary | ICD-10-CM | POA: Diagnosis not present

## 2016-03-17 DIAGNOSIS — I2510 Atherosclerotic heart disease of native coronary artery without angina pectoris: Secondary | ICD-10-CM | POA: Diagnosis not present

## 2016-03-17 DIAGNOSIS — I10 Essential (primary) hypertension: Secondary | ICD-10-CM | POA: Diagnosis not present

## 2016-03-18 ENCOUNTER — Other Ambulatory Visit: Payer: Self-pay | Admitting: Pharmacist

## 2016-03-18 NOTE — Patient Outreach (Signed)
[facility]) Care Management  [place]   03/18/2016  Jessica Donovan [birth year] [id number]  Subjective: Jessica Donovan is a 70yo who was referred to [facility] for medication management due to decreased vision.  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  Patient reports having a hard time seeing her pills and putting them in the pill box.  Patient had PCP visit today with Dr. Ouida Sills and I met patient at PCP office.   I identified two pharmacies in [place] that have programs for adherence packaging.    [facility] has a bubble packaging system.  I spoke to [place] at [facility] who confirms they could fill patient's medications for a 90-day supply and then they would bubble package patient's medications every 4 weeks and deliver them to patient for no additional fee.    [facility] has a bubble packaging system or they have a program that assists patients by filling bi-weekly pill boxes.  I spoke to [place] at [facility] who confirms they could fill patient's medications for a 90-day supply and then they would fill bi-weekly pill boxes for patient and deliver them for a fee.    I reviewed these medication management options with patient.    Objective:   Current Medications: Current Outpatient Prescriptions  Medication Sig Dispense Refill  . acetaminophen (TYLENOL) 500 MG tablet Take 1,000 mg by mouth 3 (three) times daily as needed.    . ALPRAZolam (XANAX) 1 MG tablet Take 1 tablet (1 mg total) by mouth 3 (three) times daily as needed for anxiety. 20 tablet 0  . amLODipine (NORVASC) 5 MG tablet Take 5 mg by mouth daily.    [name] aspirin EC 81 MG tablet Take 81 mg by mouth daily.    [name] atorvastatin (LIPITOR) 20 MG tablet Take 20 mg by mouth daily.    . Biotin 5 MG CAPS Take 1 capsule by mouth daily.    . cetirizine (ZYRTEC) 10 MG tablet Take 10 mg by mouth daily as needed for allergies.    .  cyanocobalamin 1000 MCG tablet Take 1,000 mcg by mouth daily.    . cyclobenzaprine (FLEXERIL) 10 MG tablet Take 1 tablet (10 mg total) by mouth 3 (three) times daily as needed for muscle spasms. 20 tablet 0  . diphenoxylate-atropine (LOMOTIL) 2.5-0.025 MG tablet Take 1 tablet by mouth 4 (four) times daily as needed for diarrhea or loose stools.    . ergocalciferol (VITAMIN D2) 50000 UNITS capsule Take 50,000 Units by mouth once a week. Reported on 02/24/2016    . ferrous sulfate 325 (65 FE) MG tablet Take 325 mg by mouth daily with breakfast.    . insulin aspart (NOVOLOG) 100 UNIT/ML injection Inject 3-15 Units into the skin 3 (three) times daily before meals. Patient reports using sliding scale:  BG < 140 = 0 units BG 140 to 180 = 3 units BG 180 to 220 = 4 units BG 221 to 260 = 6 units BG 261 to 320 = 8 units BG 321 to 360 = 10 units BG 361 to 400 = 12 units BG > 400 = 15 units BG > 450 = call PCP    . Insulin Glargine 300 UNIT/ML SOPN Inject 50 Units into the skin daily. Sliding scale    . isosorbide mononitrate (IMDUR) 30 MG 24 hr tablet Take 30 mg by mouth daily. Reported on 12/07/2015    . liothyronine (CYTOMEL) 25 MCG tablet Take  25 mcg by mouth daily. Reported on 03/16/2016    . lisinopril (PRINIVIL,ZESTRIL) 20 MG tablet Take 20 mg by mouth daily.    . metoprolol tartrate (LOPRESSOR) 25 MG tablet Take 25 mg by mouth 2 (two) times daily. 2 tablets twice a day    . mirtazapine (REMERON) 15 MG tablet Take 15 mg by mouth at bedtime.    [name] morphine (MS CONTIN) 30 MG 12 hr tablet Take 1 tablet (30 mg total) by mouth every 12 (twelve) hours. (Patient taking differently: Take 30 mg by mouth 3 (three) times daily. ) 30 tablet 0  . morphine (MSIR) 15 MG tablet Take 1 tablet (15 mg total) by mouth every 6 (six) hours as needed for moderate pain or severe pain. 30 tablet 0  . Multiple Vitamin (MULTIVITAMIN) capsule Take 1 capsule by mouth daily.    . nitrofurantoin, macrocrystal-monohydrate,  (MACROBID) 100 MG capsule Take 100 mg by mouth daily.    . nitroGLYCERIN (NITROSTAT) 0.4 MG SL tablet Place 0.4 mg under the tongue every 5 (five) minutes as needed for chest pain.    [name] omeprazole (PRILOSEC) 20 MG capsule Take 20 mg by mouth daily.    . pregabalin (LYRICA) 75 MG capsule Take 1 capsule (75 mg total) by mouth 2 (two) times daily. (Patient not taking: Reported on 03/16/2016) 60 capsule 0  . ticagrelor (BRILINTA) 90 MG TABS tablet Take 90 mg by mouth 2 (two) times daily.    [name] tolterodine (DETROL LA) 4 MG 24 hr capsule Take 4 mg by mouth daily.    . traZODone (DESYREL) 100 MG tablet Take 100 mg by mouth at bedtime as needed for sleep.    [name] venlafaxine XR (EFFEXOR-XR) 150 MG 24 hr capsule Take 150 mg by mouth daily with breakfast.     No current facility-administered medications for this visit.   Functional Status: In your present state of health, do you have any difficulty performing the following activities: 03/09/2016 02/23/2016  Hearing? N N  Vision? Y N  Difficulty concentrating or making decisions? Y N  Walking or climbing stairs? Y Y  Dressing or bathing? Y N  Doing errands, shopping? Tempie Donning  Preparing Food and eating ? Y -  Using the Toilet? N -  In the past six months, have you accidently leaked urine? N -  Do you have problems with loss of bowel control? N -  Managing your Medications? Y -  Managing your Finances? Y -  Housekeeping or managing your Housekeeping? N -   Fall/Depression Screening: PHQ 2/9 Scores 03/09/2016 02/21/2016  PHQ - 2 Score 2 1  PHQ- 9 Score 4 6    Assessment: 1.  Medication management:  Patient's medications were reviewed with PCP during visit.  Discussed medication discrepancies with metoprolol and insulin regimen with PCP.  Patient was instructed by PCP to continue to take metoprolol tartrate 25 mg BID and Tuojeo 50 units daily along with Novolog sliding scale.    Also discussed with patient long-term plan for medication management.  I spoke to  [place] at [facility] who confirms that she has a visit scheduled for 03/19/16 to assist patient in filling weekly pill box.  She also confirmed that home health has orders to continue to fill patient's pill box for 7 more weeks.  Patient was interested in using [facility] in the future and enrolling in the bi-weekly pill box program.  I reviewed fee associated with the pill box program and patient was  agreeable to fee.  This program will allow her to fill a 90-day supply of her medications which will result in cost savings for the patient.    Plan: 1.  Patient to continue to take all medications as prescribed.  2.  Patient will transition to using [place] for her prescription medications and will plan to enroll in bi-weekly pill box fills through [place].  Patient requested 90-day refills from her PCP today and requested to have refills sent to [facility].   3.  Home health nurse from [facility] will continue to assist patient in pill box fills until transition to [place] is completed.  I spoke to [place] at [facility] who confirmed that she has visit scheduled for 03/19/16 to assist patient in filling weekly pill box.  Patient is aware of home health appointment.   4.  I will continue to assist patient in transitioning to the pill-box program at [place].  Will follow up with patient within one week to check on status of transition.    [facility] CM Care Plan Problem One        Most Recent Value   Care Plan Problem One  Medication management   Role Documenting the Problem One  Clinical Pharmacist   Care Plan for Problem One  Active   [facility] CM Short Term Goal #1 (0-30 days)  Patient will transition prescription medications to [facility] and will enroll in bi-weekly pill box fills through [place] in the next 30 days.    [facility] CM Short Term Goal #1 Start Date  03/17/16   Interventions for Short Term Goal #1  Discussed options for  medication managemnet with patient and she decided to transition prescriptions to [place] and have pill boxes filled by [place] every 2 weeks.  Patient requested for medication refills from PCP be sent to [place].       Elisabeth Most, Pharm.D. Pharmacy Resident [place] [phone]

## 2016-03-19 NOTE — Patient Outreach (Signed)
[facility]) Care Management  03/19/2016  [name] 1946/04/05 [id number]   Care coordination:  [name] is a 69yo who was [facility] for medication management.  Patient is in the process of [facility] in order to use their program that fills bi-weekly pill boxes.  I spoke to [facility] at [facility] regarding the process to initiate pill box fills.  Merichelle states patient will need to have 4 pill boxes that they can use.  Merichelle states they have pill boxes that patients can purchase at the pharmacy.  She also states patient will need to [facility] and patient will need to set up a form of payment at their pharmacy.  Merichelle reports she will call patient to set up the payment information.  I informed Merichelle that the patient has already requested to have her [facility].  Merichelle confirms they received prescriptions for the patient for ferrous sulfate, tolterodine, nitrofurantoin, and amlodipine.  Patient requested several other refills from her PCP, but Merichelle states they have not received any other medications at this time.    I updated patient on this information.  Patient confirms she is okay with purchasing four new pill boxes and states she will set up payment at the pharmacy when Merichelle calls her.  Patient reports she has at least a week of her medications except for nitrofurantoin which she will run out of on Saturday.  We requested to have nitrofurantoin filled and delivered by [facility].  Requested for medication to be delivered on 03/19/16 in order for home health nurse to fill pill box during her visit that day.  Patient also reports her provider, Dr. Charlton HawsMcConnell, switched her from tolterodine to [facility] for overactive bladder.  Patient reports she picked [facility] the [facility].  I will call patient's PCP office to request that patients other prescription refills be [facility].    Will continue to assist patient as she transitions to [facility].  I will follow up with patient within one week.     [name], Pharm.D. Pharmacy Resident [facility] [phone]

## 2016-03-20 DIAGNOSIS — E1140 Type 2 diabetes mellitus with diabetic neuropathy, unspecified: Secondary | ICD-10-CM | POA: Diagnosis not present

## 2016-03-20 DIAGNOSIS — R296 Repeated falls: Secondary | ICD-10-CM | POA: Diagnosis not present

## 2016-03-20 DIAGNOSIS — F419 Anxiety disorder, unspecified: Secondary | ICD-10-CM | POA: Diagnosis not present

## 2016-03-20 DIAGNOSIS — I25119 Atherosclerotic heart disease of native coronary artery with unspecified angina pectoris: Secondary | ICD-10-CM | POA: Diagnosis not present

## 2016-03-20 DIAGNOSIS — R55 Syncope and collapse: Secondary | ICD-10-CM | POA: Diagnosis not present

## 2016-03-20 DIAGNOSIS — S0003XD Contusion of scalp, subsequent encounter: Secondary | ICD-10-CM | POA: Diagnosis not present

## 2016-03-20 DIAGNOSIS — M5442 Lumbago with sciatica, left side: Secondary | ICD-10-CM | POA: Diagnosis not present

## 2016-03-20 DIAGNOSIS — M6281 Muscle weakness (generalized): Secondary | ICD-10-CM | POA: Diagnosis not present

## 2016-03-20 DIAGNOSIS — I10 Essential (primary) hypertension: Secondary | ICD-10-CM | POA: Diagnosis not present

## 2016-03-20 DIAGNOSIS — M5441 Lumbago with sciatica, right side: Secondary | ICD-10-CM | POA: Diagnosis not present

## 2016-03-23 ENCOUNTER — Other Ambulatory Visit: Payer: Self-pay | Admitting: Pharmacist

## 2016-03-23 NOTE — Patient Outreach (Signed)
[facility]) Care Management  03/23/2016  [name] [birth year] [id number]   [name] is a 69yo who [facility] for [facility].  Patient is in the process of transitioning [facility] in order to use their program that fills bi-weekly pill boxes.   I received a return phone call from patient's friend Jessica Donovan who reports she will not see Jessica Donovan today, but will most likely see her tomorrow afternoon.  Jessica Donovan reports that as of Friday afternoon (03/20/16), patient had been in contact with [facility], had faxed them over her medication list, and was waiting on a delivery [facility].  Jessica Donovan reports when she sees Jessica Donovan tomorrow, she will let patient know that I was trying to get in contact with her and will have patient call me back if she has not already contacted me.    I will reach out to patient within one week if patient does not return my phone call.    [name], Pharm.D. Pharmacy Resident [facility] [phone]

## 2016-03-23 NOTE — Patient Outreach (Signed)
[facility]) Care Management  03/23/2016  Jessica Donovan 1946-12-05 [id number]   Jessica DanceBarbara Guillen is a 69yo who was [facility] for medication management.  Patient is in the process of [facility] in order to use their program that fills bi-weekly pill boxes.  I made outreach call to patient to follow up.  Patient's phone went straight to voicemail.  I left a HIPAA compliant voicemail for patient to return my call.  Patient has previously given permission to reach out to her friend [name] if we are unable to reach patient.  I made an outreach call to patient's friend [name].  There was no answer.  I left a HIPAA compliant voicemail with [name] requesting a return call.  I will reach out to patient within one week if patient does not return my phone call.    Lilla Shookachel Miciah Covelli, Pharm.D. Pharmacy Resident [facility] [phone]

## 2016-03-24 ENCOUNTER — Other Ambulatory Visit: Payer: Self-pay | Admitting: Pharmacist

## 2016-03-24 ENCOUNTER — Other Ambulatory Visit: Payer: Self-pay | Admitting: *Deleted

## 2016-03-24 DIAGNOSIS — S0003XD Contusion of scalp, subsequent encounter: Secondary | ICD-10-CM | POA: Diagnosis not present

## 2016-03-24 DIAGNOSIS — M6281 Muscle weakness (generalized): Secondary | ICD-10-CM | POA: Diagnosis not present

## 2016-03-24 DIAGNOSIS — R296 Repeated falls: Secondary | ICD-10-CM | POA: Diagnosis not present

## 2016-03-24 DIAGNOSIS — I25119 Atherosclerotic heart disease of native coronary artery with unspecified angina pectoris: Secondary | ICD-10-CM | POA: Diagnosis not present

## 2016-03-24 DIAGNOSIS — R55 Syncope and collapse: Secondary | ICD-10-CM | POA: Diagnosis not present

## 2016-03-24 DIAGNOSIS — I10 Essential (primary) hypertension: Secondary | ICD-10-CM | POA: Diagnosis not present

## 2016-03-24 DIAGNOSIS — F419 Anxiety disorder, unspecified: Secondary | ICD-10-CM | POA: Diagnosis not present

## 2016-03-24 DIAGNOSIS — M5441 Lumbago with sciatica, right side: Secondary | ICD-10-CM | POA: Diagnosis not present

## 2016-03-24 DIAGNOSIS — M5442 Lumbago with sciatica, left side: Secondary | ICD-10-CM | POA: Diagnosis not present

## 2016-03-24 DIAGNOSIS — E1140 Type 2 diabetes mellitus with diabetic neuropathy, unspecified: Secondary | ICD-10-CM | POA: Diagnosis not present

## 2016-03-24 NOTE — Patient Outreach (Signed)
[facility]) Care Management  [facility] Social Work  03/24/2016  Jessica Donovan 1946-10-12 [id number]  Subjective:  Patient is a 70 year old female.  Objective:   Encounter Medications:  Outpatient Encounter Prescriptions as of 03/24/2016  Medication Sig Note  . acetaminophen (TYLENOL) 500 MG tablet Take 1,000 mg by mouth 3 (three) times daily as needed.   . ALPRAZolam (XANAX) 1 MG tablet Take 1 tablet (1 mg total) by mouth 3 (three) times daily as needed for anxiety.   [name] amLODipine (NORVASC) 5 MG tablet Take 5 mg by mouth daily.   [name] aspirin EC 81 MG tablet Take 81 mg by mouth daily.   [name] atorvastatin (LIPITOR) 20 MG tablet Take 20 mg by mouth daily.   . Biotin 5 MG CAPS Take 1 capsule by mouth daily.   . cetirizine (ZYRTEC) 10 MG tablet Take 10 mg by mouth daily as needed for allergies.   . cyanocobalamin 1000 MCG tablet Take 1,000 mcg by mouth daily.   . cyclobenzaprine (FLEXERIL) 10 MG tablet Take 1 tablet (10 mg total) by mouth 3 (three) times daily as needed for muscle spasms.   . diphenoxylate-atropine (LOMOTIL) 2.5-0.025 MG tablet Take 1 tablet by mouth 4 (four) times daily as needed for diarrhea or loose stools. 03/16/2016: Reports taking as needed (approximately once every 2 months)  . ergocalciferol (VITAMIN D2) 50000 UNITS capsule Take 50,000 Units by mouth once a week. Reported on 02/24/2016   . ferrous sulfate 325 (65 FE) MG tablet Take 325 mg by mouth daily with breakfast.   . insulin aspart (NOVOLOG) 100 UNIT/ML injection Inject 3-15 Units into the skin 3 (three) times daily before meals. Patient reports using sliding scale:  BG < 140 = 0 units BG 140 to 180 = 3 units BG 180 to 220 = 4 units BG 221 to 260 = 6 units BG 261 to 320 = 8 units BG 321 to 360 = 10 units BG 361 to 400 = 12 units BG > 400 = 15 units BG > 450 = call PCP   . Insulin Glargine 300 UNIT/ML SOPN Inject 50 Units into the skin daily. Sliding scale 03/16/2016: Reports taking daily if BG > 140  mg/dL   . isosorbide mononitrate (IMDUR) 30 MG 24 hr tablet Take 30 mg by mouth daily. Reported on 12/07/2015   . liothyronine (CYTOMEL) 25 MCG tablet Take 25 mcg by mouth daily. Reported on 03/16/2016 03/16/2016: Patient reports her provider discontinued this medication  . lisinopril (PRINIVIL,ZESTRIL) 20 MG tablet Take 20 mg by mouth daily.   . metoprolol tartrate (LOPRESSOR) 25 MG tablet Take 25 mg by mouth 2 (two) times daily. 2 tablets twice a day   . mirtazapine (REMERON) 15 MG tablet Take 15 mg by mouth at bedtime.   [name] morphine (MS CONTIN) 30 MG 12 hr tablet Take 1 tablet (30 mg total) by mouth every 12 (twelve) hours. (Patient taking differently: Take 30 mg by mouth 3 (three) times daily. )   . morphine (MSIR) 15 MG tablet Take 1 tablet (15 mg total) by mouth every 6 (six) hours as needed for moderate pain or severe pain. 03/09/2016: rx for three times a day   . Multiple Vitamin (MULTIVITAMIN) capsule Take 1 capsule by mouth daily.   . nitrofurantoin, macrocrystal-monohydrate, (MACROBID) 100 MG capsule Take 100 mg by mouth daily.   . nitroGLYCERIN (NITROSTAT) 0.4 MG SL tablet Place 0.4 mg under the tongue every 5 (five) minutes as needed for chest  pain. 03/16/2016: Has on hand if needed  . omeprazole (PRILOSEC) 20 MG capsule Take 20 mg by mouth daily.   . pregabalin (LYRICA) 75 MG capsule Take 1 capsule (75 mg total) by mouth 2 (two) times daily. (Patient not taking: Reported on 03/16/2016) 03/09/2016: Pt reports new rx is 50mg  in am, 150 mg in pm   . ticagrelor (BRILINTA) 90 MG TABS tablet Take 90 mg by mouth 2 (two) times daily.   [name]. tolterodine (DETROL LA) 4 MG 24 hr capsule Take 4 mg by mouth daily.   . traZODone (DESYREL) 100 MG tablet Take 100 mg by mouth at bedtime as needed for sleep.   [name]. venlafaxine XR (EFFEXOR-XR) 150 MG 24 hr capsule Take 150 mg by mouth daily with breakfast.    No facility-administered encounter medications on file as of 03/24/2016.    Functional Status:  In your  present state of health, do you have any difficulty performing the following activities: 03/09/2016 02/23/2016  Hearing? N N  Vision? Y N  Difficulty concentrating or making decisions? Y N  Walking or climbing stairs? Y Y  Dressing or bathing? Y N  Doing errands, shopping? Malvin JohnsY Y  Preparing Food and eating ? Y -  Using the Toilet? N -  In the past six months, have you accidently leaked urine? N -  Do you have problems with loss of bowel control? N -  Managing your Medications? Y -  Managing your Finances? Y -  Housekeeping or managing your Housekeeping? N -    Fall/Depression Screening:  PHQ 2/9 Scores 03/09/2016 02/21/2016  PHQ - 2 Score 2 1  PHQ- 9 Score 4 6    Assessment: Home visit to patient's home.  Patient is currently transitioning to [facility] medication home delivery.  [name], pharmacist with[facility] is currently assisting with this transition and will come by today at 3:30pm to continue to assist with this process.  Patient continues to be interested in personal care services, however would like to get the medication home delivery arranged first before she moves in to in home assistance to ensure that in home care is something she can afford.  Plan: Home visit scheduled for 04/23/16 at 10:30am   Adriana ReamsChrystal Land, LCSW [facility] Care Management [phone]

## 2016-03-24 NOTE — Patient Outreach (Signed)
[facility]) Care Management  03/24/2016  Mellody DanceBarbara Chittum Apr 20, 1946 [id number]   Mellody DanceBarbara Preslar is a 70yo who [facility].  Patient is in the process of [facility] in order to use their program that fills bi-weekly pill boxes. I received a phone call today from [facility] Licensed Clinical Social Worker Toll BrothersChrystal Land who was at patient's home for a scheduled home visit.  [name] identified that her phone had been switched to airplane mode yesterday and that is why it went straight to voicemail.  I spoke to patient who reports [facility].  Patient reports she is currently out of nitrofurantoin.  Patient states she has all other medication at this time.  Patient reports she did fax over her medication list to [facility] pharmacy for them to use for pill box fills but she has not heard anything else about when [facility] will start pill box fills.  I scheduled home visit with patient today at 3:30 to assist patient in getting medications [facility].    [name], Pharm.D. Pharmacy Resident [facility] [phone]

## 2016-03-24 NOTE — Patient Outreach (Signed)
[facility](THN) Care Management  [facility]   03/24/2016  Jessica DanceBarbara Grounds [birth year] [id number]  Subjective: Jessica Donovan is a 69yo who [facility] for [facility].  I made home visit today to assist patient in [facility] to enroll in bi-weekly pill boxes.    I [facility] and [facility] to [facility] who confirms they received prescriptions for the following medications: amlodipine, atorvastatin, ergocalciferol, ferrous sulfate, isosorbide mononitrate, mirtazapine, nitrofurantoin, omeprazole, trazodone, and venlafaxine.  Merichelle reports that she will unable to fill nitrofurantoin refill last week as requested because prior authorization is required.  Merichelle reports she spoke to patient on Friday to update her regarding required prior authorization.  Merichelle states patient's provider has been contacted regarding prior authorization.  Patient reports she has been out of nitrofurantoin since Saturday.  Patient has at least a 30 day supply of all other medications except ferrous sulfate, metoprolol tartrate, mirtazapine, and trazodone.    Patient recently filled her alprazolam, morphine ER and [facility]  Patient states she [facility] to [facility] prescriptions for [facility].  Patient also receives Brillinta from [employer] patient assistance program and reports she is approved to continue to receive medication until 12/20/16.  Patient understands that [facility] will not be able to place any [facility] in her bi-weekly pill boxes.   Objective:   Encounter Medications: Outpatient Encounter Prescriptions as of 03/24/2016  Medication Sig Note  . acetaminophen (TYLENOL) 500 MG tablet Take 1,000 mg by mouth 3 (three) times daily as needed.   . ALPRAZolam (XANAX) 1 MG tablet Take 1 tablet (1 mg  total) by mouth 3 (three) times daily as needed for anxiety.   [name]. amLODipine (NORVASC) 5 MG tablet Take 5 mg by mouth daily.   [name]. aspirin EC 81 MG tablet Take 81 mg by mouth daily.   [name]. atorvastatin (LIPITOR) 20 MG tablet Take 20 mg by mouth daily.   . Biotin 5 MG CAPS Take 1 capsule by mouth daily.   . cetirizine (ZYRTEC) 10 MG tablet Take 10 mg by mouth daily as needed for allergies.   . cyanocobalamin 1000 MCG tablet Take 1,000 mcg by mouth daily.   . cyclobenzaprine (FLEXERIL) 10 MG tablet Take 1 tablet (10 mg total) by mouth 3 (three) times daily as needed for muscle spasms.   . diphenoxylate-atropine (LOMOTIL) 2.5-0.025 MG tablet Take 1 tablet by mouth 4 (four) times daily as needed for diarrhea or loose stools. 03/16/2016: Reports taking as needed (approximately once every 2 months)  . ergocalciferol (VITAMIN D2) 50000 UNITS capsule Take 50,000 Units by mouth once a week. Reported on 02/24/2016   . ferrous sulfate 325 (65 FE) MG tablet Take 325 mg by mouth daily with breakfast.   . fesoterodine (TOVIAZ) 8 MG TB24 tablet Take 8 mg by mouth daily.   . insulin aspart (NOVOLOG) 100 UNIT/ML injection Inject 3-15 Units into the skin 3 (three) times daily before meals. Patient reports using sliding scale:  BG < 140 = 0 units BG 140 to 180 = 3 units BG 180 to 220 = 4 units BG 221 to 260 = 6 units BG 261 to 320 = 8 units BG 321 to 360 = 10 units BG 361 to 400 = 12 units BG > 400 = 15 units BG > 450 = call PCP   . Insulin Glargine 300 UNIT/ML  SOPN Inject 50 Units into the skin daily. Sliding scale 03/16/2016: Reports taking daily if BG > 140 mg/dL   . isosorbide mononitrate (IMDUR) 30 MG 24 hr tablet Take 30 mg by mouth daily. Reported on 12/07/2015   . lisinopril (PRINIVIL,ZESTRIL) 20 MG tablet Take 20 mg by mouth daily.   . metoprolol tartrate (LOPRESSOR) 25 MG tablet Take 25 mg by mouth 2 (two) times daily. 2 tablets twice a day   . mirtazapine (REMERON) 15 MG tablet Take 15 mg by mouth at  bedtime.   [name] morphine (MS CONTIN) 30 MG 12 hr tablet Take 1 tablet (30 mg total) by mouth every 12 (twelve) hours. (Patient taking differently: Take 30 mg by mouth 3 (three) times daily. )   . morphine (MSIR) 15 MG tablet Take 1 tablet (15 mg total) by mouth every 6 (six) hours as needed for moderate pain or severe pain. 03/09/2016: rx for three times a day   . Multiple Vitamin (MULTIVITAMIN) capsule Take 1 capsule by mouth daily.   . nitrofurantoin, macrocrystal-monohydrate, (MACROBID) 100 MG capsule Take 100 mg by mouth daily.   . nitroGLYCERIN (NITROSTAT) 0.4 MG SL tablet Place 0.4 mg under the tongue every 5 (five) minutes as needed for chest pain. 03/16/2016: Has on hand if needed  . omeprazole (PRILOSEC) 20 MG capsule Take 20 mg by mouth daily.   . ticagrelor (BRILINTA) 90 MG TABS tablet Take 90 mg by mouth 2 (two) times daily.   . traZODone (DESYREL) 100 MG tablet Take 100 mg by mouth at bedtime as needed for sleep.   [name] venlafaxine XR (EFFEXOR-XR) 150 MG 24 hr capsule Take 150 mg by mouth daily with breakfast.   . liothyronine (CYTOMEL) 25 MCG tablet Take 25 mcg by mouth daily. Reported on 03/24/2016 03/16/2016: Patient reports her provider discontinued this medication  . pregabalin (LYRICA) 75 MG capsule Take 1 capsule (75 mg total) by mouth 2 (two) times daily. (Patient not taking: Reported on 03/16/2016) 03/24/2016: Patient took prescription to pharmacy. States pharmacist said it will be ready to pick up on Thursday.   . tolterodine (DETROL LA) 4 MG 24 hr capsule Take 4 mg by mouth daily. Reported on 03/24/2016 03/24/2016: Patient's provider, Dr. Emilia Beck changed patient to Toviaz   No facility-administered encounter medications on file as of 03/24/2016.    Functional Status: In your present state of health, do you have any difficulty performing the following activities: 03/09/2016 02/23/2016  Hearing? N N  Vision? Y N  Difficulty concentrating or making decisions? Y N  Walking or climbing stairs? Y  Y  Dressing or bathing? Y N  Doing errands, shopping? [name]  Preparing Food and eating ? Y -  Using the Toilet? N -  In the past six months, have you accidently leaked urine? N -  Do you have problems with loss of bowel control? N -  Managing your Medications? Y -  Managing your Finances? Y -  Housekeeping or managing your Housekeeping? N -    Fall/Depression Screening: PHQ 2/9 Scores 03/09/2016 02/21/2016  PHQ - 2 Score 2 1  PHQ- 9 Score 4 6    Assessment: 1.  Medication management:  Assisted patient in filling weekly pill box.  Patient had enough of all medications except nitrofurantoin and Lyrica.  Patient reports she plans to pick up Lyrica prescription from [facility] on 03/26/16.  The following mediations were placed in her pill box: alprazolam (per patient preference), amlodipine, aspirin, atorvastatin, cyclobenzaprine, ergocalciferol, ferrous sulfate,  isosorbide mononitrate, lisinopril, metoprolol tartrate, mitrazapine, morphine ER, omeprazole, ticagrelor, trazodone, venlafaxine, and Toviaz.  I advised patient to add nitrofurantoin and Lyrica when she receives these refills from the pharmacy.  I confirmed with patient's home health nurse Trula Ore that home health is scheduled next week (on Monday, 03/30/16) and can continue with weekly pill box fills.    During visit, called provider office to request refills of the following medications: metoprolol tartrate, lisinopril, Novolog.  I informed provider's office that prior authorization is required for nitrofurantoin.  Will also send an inbasket to patient's PCP Dr. Dareen Piano regarding nitrofurantoin.  Per beers list, it is recommended to avoid long-term use of nitrofurantoin in elderly due to potential for pulmonary toxicity, hepatotoxicity, and peripheral neuropathy, especially with long-tern use.  Per patient's insurance plan, preferred antibiotics include sulfamethoxazole/trimethoprim or ciprofloxacin.    Requested for [facility] to fill  and deliver the following medications: mirtazapine, trazodone, ferrous sulfate and metoprolol (once prescription is received from provider).  Patient will fill 30-day supply of these medications at this time until she can sync refills with other prescriptions that are not due to be refilled yet.  Patient will then plan to fill 90 day supplies in the future.  Patient also requested to have 10-day supply of nitrofurantoin filled until prior authorization is completed by provider.    Plan: 1.  Patient will receive a phone call from [facility] regarding delivery of medications on 03/25/16.  Advised patient to call me if she does not hear from [facility].  Provided patient with my phone number again.   2.  Will inbasket patient's PCP regarding prior authorization for nitrofurantoin and will recommend consideration of alternative antibiotic for long-term use in UTI prophylaxis such as sulfamethoxazole/trimethoprim if clinically appropriate.   3.  Follow up phone call planned for 03/31/16.    [facility] CM Care Plan Problem One        Most Recent Value   Care Plan Problem One  Medication management   Role Documenting the Problem One  Clinical Pharmacist   Care Plan for Problem One  Active   [facility] CM Short Term Goal #1 (0-30 days)  Patient will transition prescription medications to [facility] and will enroll in bi-weekly pill box fills through [facility] in the next 30 days.    [facility] CM Short Term Goal #1 Start Date  03/17/16   Interventions for Short Term Goal #1  Assisted patient in checking on status of prescriptions at [facility].  Requested refills of several prescriptions and enrolled patient in delivery service through [facility]  Will work on syncing patient's prescriptions prior to enrolling in bi-weekly pill box fills.  Home Health will continue to assist with pill box fills at this itme.      Lilla Shook, Pharm.D. Pharmacy Resident [facility] [employer] [phone]

## 2016-03-25 ENCOUNTER — Other Ambulatory Visit: Payer: Self-pay | Admitting: Pharmacist

## 2016-03-26 DIAGNOSIS — I10 Essential (primary) hypertension: Secondary | ICD-10-CM | POA: Diagnosis not present

## 2016-03-26 DIAGNOSIS — Z9889 Other specified postprocedural states: Secondary | ICD-10-CM | POA: Diagnosis not present

## 2016-03-26 DIAGNOSIS — E119 Type 2 diabetes mellitus without complications: Secondary | ICD-10-CM | POA: Diagnosis not present

## 2016-03-26 DIAGNOSIS — R079 Chest pain, unspecified: Secondary | ICD-10-CM | POA: Diagnosis not present

## 2016-03-26 DIAGNOSIS — R55 Syncope and collapse: Secondary | ICD-10-CM | POA: Diagnosis not present

## 2016-03-26 DIAGNOSIS — I2510 Atherosclerotic heart disease of native coronary artery without angina pectoris: Secondary | ICD-10-CM | POA: Diagnosis not present

## 2016-03-26 NOTE — Patient Outreach (Signed)
[facility]) Care Management  03/26/2016  Jessica DanceBarbara Kendrick July 15, 1946 [id number]   Jessica Donovan is a 70yo who [facility].  I received a phone call from patient and her caregiver [name].  Patient called to inform me that she [facility] including nitrofurantoin 10-day supply and [facility].  Patient confirms these medications have been added to her pill box and that she now has all of her medications.    Patient and caregiver also inquired about an application that patient received in the mail for [employer] patient assistance program.  Patient was unsure why she received application.  I informed patient that I do not know who requested for application to be mailed to her, but that [employer] patient assistance program is a program for patients who have difficulty affording medications.  Patient is currently taking Lyrica which is offered through [employer] program.  Patient reports she just purchased a 90day supply of Lyrica and denies difficulty affording the medication at this time.  She states she has difficulty affording medication if she is in the donut hole.  I informed patient to hold onto application and we can discuss completion of application at next home visit.    Plan:  Follow up phone call on 03/31/16 as scheduled.     [name], Pharm.D. Pharmacy Resident [facility] [phone]

## 2016-03-27 ENCOUNTER — Other Ambulatory Visit: Payer: Self-pay | Admitting: Pharmacist

## 2016-03-27 NOTE — Patient Outreach (Signed)
[facility]) Care Management  03/27/2016  Jessica DanceBarbara Frymire 01/18/1946 [id number]   Jessica DanceBarbara Crothers is a 69yo who [facility].  I received a voicemail from patient who reports she had a doctor's appointment this week and reports her amlodipine was discontinued due to systolic blood pressure in the 90s.  I was unable to hear the remainder of the voicemail as patient's phone cut off.  I made return phone call to patient but there was no answer.  I left a HIPAA compliant voicemail for patient to return my phone call.      [name], Pharm.D. Pharmacy Resident [facility] [phone]

## 2016-03-30 ENCOUNTER — Other Ambulatory Visit: Payer: Self-pay | Admitting: Pharmacist

## 2016-03-30 DIAGNOSIS — M5442 Lumbago with sciatica, left side: Secondary | ICD-10-CM | POA: Diagnosis not present

## 2016-03-30 DIAGNOSIS — S0003XD Contusion of scalp, subsequent encounter: Secondary | ICD-10-CM | POA: Diagnosis not present

## 2016-03-30 DIAGNOSIS — R296 Repeated falls: Secondary | ICD-10-CM | POA: Diagnosis not present

## 2016-03-30 DIAGNOSIS — M5441 Lumbago with sciatica, right side: Secondary | ICD-10-CM | POA: Diagnosis not present

## 2016-03-30 DIAGNOSIS — R55 Syncope and collapse: Secondary | ICD-10-CM | POA: Diagnosis not present

## 2016-03-30 DIAGNOSIS — M6281 Muscle weakness (generalized): Secondary | ICD-10-CM | POA: Diagnosis not present

## 2016-03-30 DIAGNOSIS — E1140 Type 2 diabetes mellitus with diabetic neuropathy, unspecified: Secondary | ICD-10-CM | POA: Diagnosis not present

## 2016-03-30 DIAGNOSIS — I10 Essential (primary) hypertension: Secondary | ICD-10-CM | POA: Diagnosis not present

## 2016-03-30 DIAGNOSIS — F419 Anxiety disorder, unspecified: Secondary | ICD-10-CM | POA: Diagnosis not present

## 2016-03-30 DIAGNOSIS — I25119 Atherosclerotic heart disease of native coronary artery with unspecified angina pectoris: Secondary | ICD-10-CM | POA: Diagnosis not present

## 2016-03-30 NOTE — Patient Outreach (Signed)
[facility]) Care Management  03/30/2016  Jessica Donovan May 20, 1946 [id number]   Jessica DanceBarbara Donovan is a 69yo who [facility].  I received a phone call from patient who reports she had an office visit with her cardiologist, Dr. Darrold JunkerParaschos, and reports her amlodipine was discontinued.  Patient confirms she took the medication out of the pill box and reports her home health nurse is scheduled to come today to refill her pill boxes for this week.  Patient reports she has not heard from [facility] regarding her nitrofurantoin prescription.    I made outreach call to [facility] and spoke to OccidentalJessica.  Shanda BumpsJessica reports patient's prescription for nitrofurantoin is still rejecting through patient's insurance due to prior authorization requirement.  Shanda BumpsJessica confirmed provider has not called in any new prescriptions to replace nitrofurantoin.    I called patient and updated her on this information.  I advised patient to call her PCP office regarding prescription.  Patient voiced understanding and reports she will make call tomorrow.  Plan:  Will follow up with patient in one week.     [name], Pharm.D. Pharmacy Resident [facility] [phone]

## 2016-03-31 ENCOUNTER — Ambulatory Visit: Payer: Self-pay | Admitting: Pharmacist

## 2016-04-03 ENCOUNTER — Emergency Department: Payer: PPO

## 2016-04-03 ENCOUNTER — Other Ambulatory Visit: Payer: Self-pay | Admitting: Pharmacist

## 2016-04-03 ENCOUNTER — Encounter: Payer: Self-pay | Admitting: Emergency Medicine

## 2016-04-03 ENCOUNTER — Inpatient Hospital Stay
Admission: EM | Admit: 2016-04-03 | Discharge: 2016-04-04 | DRG: 603 | Disposition: A | Discharge status: Home / Self Care | Payer: PPO | Attending: Internal Medicine | Admitting: Internal Medicine

## 2016-04-03 DIAGNOSIS — F329 Major depressive disorder, single episode, unspecified: Secondary | ICD-10-CM | POA: Diagnosis not present

## 2016-04-03 DIAGNOSIS — Z888 Allergy status to other drugs, medicaments and biological substances status: Secondary | ICD-10-CM | POA: Diagnosis not present

## 2016-04-03 DIAGNOSIS — Z8673 Personal history of transient ischemic attack (TIA), and cerebral infarction without residual deficits: Secondary | ICD-10-CM

## 2016-04-03 DIAGNOSIS — Z79891 Long term (current) use of opiate analgesic: Secondary | ICD-10-CM | POA: Diagnosis not present

## 2016-04-03 DIAGNOSIS — L03031 Cellulitis of right toe: Secondary | ICD-10-CM | POA: Diagnosis not present

## 2016-04-03 DIAGNOSIS — I10 Essential (primary) hypertension: Secondary | ICD-10-CM | POA: Diagnosis not present

## 2016-04-03 DIAGNOSIS — L97511 Non-pressure chronic ulcer of other part of right foot limited to breakdown of skin: Secondary | ICD-10-CM | POA: Diagnosis not present

## 2016-04-03 DIAGNOSIS — I252 Old myocardial infarction: Secondary | ICD-10-CM

## 2016-04-03 DIAGNOSIS — I2510 Atherosclerotic heart disease of native coronary artery without angina pectoris: Secondary | ICD-10-CM | POA: Diagnosis not present

## 2016-04-03 DIAGNOSIS — E1140 Type 2 diabetes mellitus with diabetic neuropathy, unspecified: Secondary | ICD-10-CM | POA: Diagnosis present

## 2016-04-03 DIAGNOSIS — Z89422 Acquired absence of other left toe(s): Secondary | ICD-10-CM

## 2016-04-03 DIAGNOSIS — Z794 Long term (current) use of insulin: Secondary | ICD-10-CM | POA: Diagnosis not present

## 2016-04-03 DIAGNOSIS — Z79899 Other long term (current) drug therapy: Secondary | ICD-10-CM | POA: Diagnosis not present

## 2016-04-03 DIAGNOSIS — Z887 Allergy status to serum and vaccine status: Secondary | ICD-10-CM

## 2016-04-03 DIAGNOSIS — E118 Type 2 diabetes mellitus with unspecified complications: Secondary | ICD-10-CM | POA: Diagnosis not present

## 2016-04-03 DIAGNOSIS — Z7982 Long term (current) use of aspirin: Secondary | ICD-10-CM | POA: Diagnosis not present

## 2016-04-03 DIAGNOSIS — K219 Gastro-esophageal reflux disease without esophagitis: Secondary | ICD-10-CM | POA: Diagnosis not present

## 2016-04-03 DIAGNOSIS — M545 Low back pain: Secondary | ICD-10-CM | POA: Diagnosis present

## 2016-04-03 DIAGNOSIS — Z9181 History of falling: Secondary | ICD-10-CM | POA: Diagnosis not present

## 2016-04-03 DIAGNOSIS — M7989 Other specified soft tissue disorders: Secondary | ICD-10-CM | POA: Diagnosis not present

## 2016-04-03 DIAGNOSIS — Z885 Allergy status to narcotic agent status: Secondary | ICD-10-CM | POA: Diagnosis not present

## 2016-04-03 DIAGNOSIS — Z7902 Long term (current) use of antithrombotics/antiplatelets: Secondary | ICD-10-CM

## 2016-04-03 DIAGNOSIS — E785 Hyperlipidemia, unspecified: Secondary | ICD-10-CM | POA: Diagnosis present

## 2016-04-03 DIAGNOSIS — Z886 Allergy status to analgesic agent status: Secondary | ICD-10-CM

## 2016-04-03 DIAGNOSIS — L03115 Cellulitis of right lower limb: Secondary | ICD-10-CM

## 2016-04-03 DIAGNOSIS — E11621 Type 2 diabetes mellitus with foot ulcer: Secondary | ICD-10-CM | POA: Diagnosis not present

## 2016-04-03 DIAGNOSIS — Z89411 Acquired absence of right great toe: Secondary | ICD-10-CM | POA: Diagnosis not present

## 2016-04-03 DIAGNOSIS — G894 Chronic pain syndrome: Secondary | ICD-10-CM | POA: Diagnosis not present

## 2016-04-03 DIAGNOSIS — M869 Osteomyelitis, unspecified: Secondary | ICD-10-CM

## 2016-04-03 DIAGNOSIS — M79674 Pain in right toe(s): Secondary | ICD-10-CM | POA: Diagnosis not present

## 2016-04-03 DIAGNOSIS — E13621 Other specified diabetes mellitus with foot ulcer: Secondary | ICD-10-CM

## 2016-04-03 DIAGNOSIS — L97519 Non-pressure chronic ulcer of other part of right foot with unspecified severity: Secondary | ICD-10-CM

## 2016-04-03 DIAGNOSIS — Z8249 Family history of ischemic heart disease and other diseases of the circulatory system: Secondary | ICD-10-CM

## 2016-04-03 DIAGNOSIS — L0390 Cellulitis, unspecified: Secondary | ICD-10-CM | POA: Diagnosis present

## 2016-04-03 LAB — COMPREHENSIVE METABOLIC PANEL
ALT: 10 U/L — AB (ref 14–54)
AST: 21 U/L (ref 15–41)
Albumin: 4.1 g/dL (ref 3.5–5.0)
Alkaline Phosphatase: 85 U/L (ref 38–126)
Anion gap: 6 (ref 5–15)
BUN: 20 mg/dL (ref 6–20)
CHLORIDE: 103 mmol/L (ref 101–111)
CO2: 27 mmol/L (ref 22–32)
Calcium: 9.1 mg/dL (ref 8.9–10.3)
Creatinine, Ser: 0.65 mg/dL (ref 0.44–1.00)
Glucose, Bld: 140 mg/dL — ABNORMAL HIGH (ref 65–99)
POTASSIUM: 4.2 mmol/L (ref 3.5–5.1)
SODIUM: 136 mmol/L (ref 135–145)
Total Bilirubin: 0.4 mg/dL (ref 0.3–1.2)
Total Protein: 7.1 g/dL (ref 6.5–8.1)

## 2016-04-03 LAB — CBC WITH DIFFERENTIAL/PLATELET
BASOS ABS: 0.1 10*3/uL (ref 0–0.1)
Basophils Relative: 1 %
EOS ABS: 0.2 10*3/uL (ref 0–0.7)
EOS PCT: 2 %
HCT: 37.3 % (ref 35.0–47.0)
Hemoglobin: 12.3 g/dL (ref 12.0–16.0)
LYMPHS ABS: 2.2 10*3/uL (ref 1.0–3.6)
LYMPHS PCT: 30 %
MCH: 30.7 pg (ref 26.0–34.0)
MCHC: 33 g/dL (ref 32.0–36.0)
MCV: 93.1 fL (ref 80.0–100.0)
MONO ABS: 0.7 10*3/uL (ref 0.2–0.9)
Monocytes Relative: 9 %
NEUTROS PCT: 58 %
Neutro Abs: 4.2 10*3/uL (ref 1.4–6.5)
PLATELETS: 256 10*3/uL (ref 150–440)
RBC: 4.01 MIL/uL (ref 3.80–5.20)
RDW: 14.7 % — AB (ref 11.5–14.5)
WBC: 7.3 10*3/uL (ref 3.6–11.0)

## 2016-04-03 LAB — GLUCOSE, CAPILLARY: GLUCOSE-CAPILLARY: 139 mg/dL — AB (ref 65–99)

## 2016-04-03 LAB — MRSA PCR SCREENING: MRSA by PCR: POSITIVE — AB

## 2016-04-03 MED ORDER — FESOTERODINE FUMARATE ER 4 MG PO TB24
8.0000 mg | ORAL_TABLET | Freq: Every day | ORAL | Status: DC
  Administered 2016-04-04: 8 mg via ORAL
  Filled 2016-04-03: qty 2
  Filled 2016-04-03: qty 1
  Filled 2016-04-03: qty 2

## 2016-04-03 MED ORDER — VITAMIN D (ERGOCALCIFEROL) 1.25 MG (50000 UNIT) PO CAPS
50000.0000 [IU] | ORAL_CAPSULE | ORAL | Status: DC
  Filled 2016-04-03: qty 1

## 2016-04-03 MED ORDER — FERROUS SULFATE 325 (65 FE) MG PO TABS
325.0000 mg | ORAL_TABLET | Freq: Every day | ORAL | Status: DC
  Administered 2016-04-04: 325 mg via ORAL
  Filled 2016-04-03: qty 1

## 2016-04-03 MED ORDER — ONDANSETRON HCL 4 MG PO TABS: 4.0000 mg | ORAL_TABLET | Freq: Four times a day (QID) | ORAL | Status: DC | PRN

## 2016-04-03 MED ORDER — INSULIN GLARGINE 300 UNIT/ML ~~LOC~~ SOPN: 50.0000 [IU] | PEN_INJECTOR | Freq: Every day | SUBCUTANEOUS | Status: DC

## 2016-04-03 MED ORDER — ASPIRIN EC 81 MG PO TBEC
81.0000 mg | DELAYED_RELEASE_TABLET | Freq: Every day | ORAL | Status: DC
  Administered 2016-04-04: 81 mg via ORAL
  Filled 2016-04-03: qty 1

## 2016-04-03 MED ORDER — MUPIROCIN 2 % EX OINT
1.0000 "application " | TOPICAL_OINTMENT | Freq: Two times a day (BID) | CUTANEOUS | Status: DC
  Administered 2016-04-03 – 2016-04-04 (×2): 1 via NASAL
  Filled 2016-04-03: qty 22

## 2016-04-03 MED ORDER — PREGABALIN 75 MG PO CAPS
150.0000 mg | ORAL_CAPSULE | Freq: Two times a day (BID) | ORAL | Status: DC
Start: 2016-04-03 — End: 2016-04-04
  Administered 2016-04-03 – 2016-04-04 (×2): 150 mg via ORAL
  Filled 2016-04-03 (×2): qty 2

## 2016-04-03 MED ORDER — INSULIN ASPART 100 UNIT/ML ~~LOC~~ SOLN: 3.0000 [IU] | Freq: Three times a day (TID) | SUBCUTANEOUS | Status: DC | PRN

## 2016-04-03 MED ORDER — ATORVASTATIN CALCIUM 20 MG PO TABS
20.0000 mg | ORAL_TABLET | Freq: Every day | ORAL | Status: DC
  Administered 2016-04-04: 20 mg via ORAL
  Filled 2016-04-03: qty 1

## 2016-04-03 MED ORDER — TRAZODONE HCL 100 MG PO TABS
100.0000 mg | ORAL_TABLET | Freq: Every day | ORAL | Status: DC
  Administered 2016-04-03: 100 mg via ORAL
  Filled 2016-04-03: qty 1

## 2016-04-03 MED ORDER — INSULIN GLARGINE 100 UNIT/ML ~~LOC~~ SOLN
50.0000 [IU] | Freq: Every day | SUBCUTANEOUS | Status: DC
  Administered 2016-04-04: 50 [IU] via SUBCUTANEOUS
  Filled 2016-04-03 (×2): qty 0.5

## 2016-04-03 MED ORDER — MIRTAZAPINE 15 MG PO TABS
15.0000 mg | ORAL_TABLET | Freq: Every day | ORAL | Status: DC
  Administered 2016-04-03: 15 mg via ORAL
  Filled 2016-04-03: qty 1

## 2016-04-03 MED ORDER — TICAGRELOR 90 MG PO TABS
90.0000 mg | ORAL_TABLET | Freq: Two times a day (BID) | ORAL | Status: DC
  Administered 2016-04-03 – 2016-04-04 (×2): 90 mg via ORAL
  Filled 2016-04-03 (×4): qty 1

## 2016-04-03 MED ORDER — CYCLOBENZAPRINE HCL 10 MG PO TABS: 10.0000 mg | ORAL_TABLET | Freq: Three times a day (TID) | ORAL | Status: DC | PRN

## 2016-04-03 MED ORDER — ALPRAZOLAM 0.5 MG PO TABS
1.0000 mg | ORAL_TABLET | Freq: Three times a day (TID) | ORAL | Status: DC | PRN
  Administered 2016-04-03 – 2016-04-04 (×2): 1 mg via ORAL
  Filled 2016-04-03: qty 1
  Filled 2016-04-03: qty 2
  Filled 2016-04-03: qty 1

## 2016-04-03 MED ORDER — ACETAMINOPHEN 650 MG RE SUPP: 650.0000 mg | Freq: Four times a day (QID) | RECTAL | Status: DC | PRN

## 2016-04-03 MED ORDER — PIPERACILLIN-TAZOBACTAM 3.375 G IVPB
3.3750 g | Freq: Three times a day (TID) | INTRAVENOUS | Status: DC
  Administered 2016-04-04 (×2): 3.375 g via INTRAVENOUS
  Filled 2016-04-03 (×4): qty 50

## 2016-04-03 MED ORDER — ADULT MULTIVITAMIN W/MINERALS CH
1.0000 | ORAL_TABLET | Freq: Every day | ORAL | Status: DC
  Administered 2016-04-04: 1 via ORAL
  Filled 2016-04-03: qty 1

## 2016-04-03 MED ORDER — AMLODIPINE BESYLATE 5 MG PO TABS
5.0000 mg | ORAL_TABLET | Freq: Every day | ORAL | Status: DC
  Administered 2016-04-04: 5 mg via ORAL
  Filled 2016-04-03: qty 1

## 2016-04-03 MED ORDER — ONDANSETRON HCL 4 MG/2ML IJ SOLN: 4.0000 mg | Freq: Four times a day (QID) | INTRAMUSCULAR | Status: DC | PRN

## 2016-04-03 MED ORDER — METOPROLOL TARTRATE 50 MG PO TABS
50.0000 mg | ORAL_TABLET | Freq: Two times a day (BID) | ORAL | Status: DC
  Administered 2016-04-03 – 2016-04-04 (×2): 50 mg via ORAL
  Filled 2016-04-03 (×2): qty 1

## 2016-04-03 MED ORDER — VANCOMYCIN HCL IN DEXTROSE 1-5 GM/200ML-% IV SOLN
1000.0000 mg | Freq: Once | INTRAVENOUS | Status: AC
  Administered 2016-04-03: 1000 mg via INTRAVENOUS
  Filled 2016-04-03: qty 200

## 2016-04-03 MED ORDER — VENLAFAXINE HCL ER 75 MG PO CP24
150.0000 mg | ORAL_CAPSULE | Freq: Every day | ORAL | Status: DC
  Administered 2016-04-04: 150 mg via ORAL
  Filled 2016-04-03: qty 2

## 2016-04-03 MED ORDER — CHLORHEXIDINE GLUCONATE CLOTH 2 % EX PADS
6.0000 | MEDICATED_PAD | Freq: Every day | CUTANEOUS | Status: DC
  Administered 2016-04-04: 6 via TOPICAL

## 2016-04-03 MED ORDER — CLINDAMYCIN PHOSPHATE 600 MG/50ML IV SOLN: 600.0000 mg | Freq: Once | INTRAVENOUS | Status: DC

## 2016-04-03 MED ORDER — LORATADINE 10 MG PO TABS
10.0000 mg | ORAL_TABLET | Freq: Every day | ORAL | Status: DC
  Administered 2016-04-04: 10 mg via ORAL
  Filled 2016-04-03: qty 1

## 2016-04-03 MED ORDER — ENOXAPARIN SODIUM 40 MG/0.4ML ~~LOC~~ SOLN
40.0000 mg | SUBCUTANEOUS | Status: DC
  Administered 2016-04-03: 40 mg via SUBCUTANEOUS
  Filled 2016-04-03: qty 0.4

## 2016-04-03 MED ORDER — ACETAMINOPHEN 325 MG PO TABS
650.0000 mg | ORAL_TABLET | Freq: Four times a day (QID) | ORAL | Status: DC | PRN
  Administered 2016-04-04 (×2): 650 mg via ORAL
  Filled 2016-04-03 (×2): qty 2

## 2016-04-03 MED ORDER — VANCOMYCIN HCL IN DEXTROSE 1-5 GM/200ML-% IV SOLN
1000.0000 mg | Freq: Two times a day (BID) | INTRAVENOUS | Status: DC
  Administered 2016-04-03 – 2016-04-04 (×2): 1000 mg via INTRAVENOUS
  Filled 2016-04-03 (×4): qty 200

## 2016-04-03 MED ORDER — LISINOPRIL 20 MG PO TABS
20.0000 mg | ORAL_TABLET | Freq: Every day | ORAL | Status: DC
  Administered 2016-04-04: 20 mg via ORAL
  Filled 2016-04-03: qty 1

## 2016-04-03 MED ORDER — ISOSORBIDE MONONITRATE ER 30 MG PO TB24
30.0000 mg | ORAL_TABLET | Freq: Every day | ORAL | Status: DC
  Administered 2016-04-04: 30 mg via ORAL
  Filled 2016-04-03: qty 1

## 2016-04-03 MED ORDER — MORPHINE SULFATE ER 30 MG PO TBCR
30.0000 mg | EXTENDED_RELEASE_TABLET | Freq: Three times a day (TID) | ORAL | Status: DC
  Administered 2016-04-03 – 2016-04-04 (×2): 30 mg via ORAL
  Filled 2016-04-03 (×2): qty 1

## 2016-04-03 MED ORDER — PIPERACILLIN-TAZOBACTAM 3.375 G IVPB
3.3750 g | Freq: Once | INTRAVENOUS | Status: AC
  Administered 2016-04-03: 3.375 g via INTRAVENOUS
  Filled 2016-04-03: qty 50

## 2016-04-03 MED ORDER — MORPHINE SULFATE 15 MG PO TABS: 15.0000 mg | ORAL_TABLET | Freq: Four times a day (QID) | ORAL | Status: DC | PRN

## 2016-04-03 MED ORDER — PANTOPRAZOLE SODIUM 40 MG PO TBEC
40.0000 mg | DELAYED_RELEASE_TABLET | Freq: Every day | ORAL | Status: DC
  Administered 2016-04-04: 40 mg via ORAL
  Filled 2016-04-03 (×2): qty 1

## 2016-04-03 MED ORDER — VITAMIN B-12 1000 MCG PO TABS
1000.0000 ug | ORAL_TABLET | Freq: Every day | ORAL | Status: DC
  Administered 2016-04-04: 1000 ug via ORAL
  Filled 2016-04-03: qty 1

## 2016-04-03 MED ORDER — VANCOMYCIN HCL IN DEXTROSE 750-5 MG/150ML-% IV SOLN
750.0000 mg | Freq: Two times a day (BID) | INTRAVENOUS | Status: DC
  Filled 2016-04-03 (×2): qty 150

## 2016-04-03 MED ORDER — NITROGLYCERIN 0.4 MG SL SUBL: 0.4000 mg | SUBLINGUAL_TABLET | SUBLINGUAL | Status: DC | PRN

## 2016-04-03 NOTE — ED Notes (Signed)
Assisted patient to restroom and gave warm blanket. Patient steady on feet with use of cane.

## 2016-04-03 NOTE — ED Provider Notes (Signed)
[facility] Emergency Department Provider Note  ____________________________________________  Time seen: Approximately 3:41 PM  I have reviewed the triage vital signs and the nursing notes.   HISTORY  Chief Complaint Toe Pain    HPI Jessica Donovan is a 70 y.o. 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 She reports she has had a small wound on the right fourth toe for several weeks however her foot did not start getting ready until yesterday. No trauma. She has had subjective fevers and chills. No vomiting or diarrhea. No chest pain and difficulty breathing. No abdominal pain.   Past Medical History  Diagnosis Date  . Hypertension   . Diabetes mellitus without complication (HCC)   . Coronary artery disease   . Low back pain   . Stroke (HCC)   . Myocardial infarction (HCC)   . Chronic back pain   . Osteomyelitis of toe (HCC) 01/23/2016    Patient Active Problem List   Diagnosis Date Noted  . Hematoma of right parietal scalp 02/23/2016  . Multiple falls 02/23/2016  . Physical debility 02/23/2016  . DM2 (diabetes mellitus, type 2) (HCC) 02/23/2016  . Syncope and collapse 02/23/2016  . Polypharmacy 02/23/2016  . Osteomyelitis of toe (HCC) 01/23/2016  . Rhabdomyolysis 12/07/2015  . ARF (acute renal failure) (HCC) 11/29/2015  . Hypotension 11/29/2015  . 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 Angina effort (HCC) 11/07/2015  . Osteomyelitis (HCC) 11/07/2015    Past Surgical History  Procedure Laterality Date  . Toe amputation    . Amputation toe Left 11/10/2015    Procedure: AMPUTATION TOE;  Surgeon: Linus Galas, MD;  Location: [facility] ORS;  Service: Podiatry;  Laterality: Left;   . Cardiac catheterization N/A 11/12/2015    Procedure: Left Heart Cath and Coronary Angiography;  Surgeon: Marcina Millard, MD;  Location: [facility] INVASIVE CV LAB;  Service: Cardiovascular;  Laterality: N/A;  . Amputation toe Left 01/24/2016    Procedure: AMPUTATION TOE (2nd mpj);  Surgeon: Linus Galas, DPM;  Location: [facility] ORS;  Service: Podiatry;  Laterality: Left;    Current Outpatient Rx  Name  Route  Sig  Dispense  Refill  . acetaminophen (TYLENOL) 500 MG tablet   Oral   Take 1,000 mg by mouth 3 (three) times daily as needed.         . ALPRAZolam (XANAX) 1 MG tablet   Oral   Take 1 tablet (1 mg total) by mouth 3 (three) times daily as needed for anxiety.   20 tablet   0   . amLODipine (NORVASC) 5 MG tablet   Oral   Take 5 mg by mouth daily.         [name] aspirin EC 81 MG tablet   Oral   Take 81 mg by mouth daily.         [name] atorvastatin (LIPITOR) 20 MG tablet   Oral   Take 20 mg by mouth daily.         . Biotin 5 MG CAPS   Oral   Take 1 capsule by mouth daily.         . cetirizine (ZYRTEC) 10 MG tablet   Oral   Take 10 mg by mouth daily as needed for allergies.         [name]  cyanocobalamin 1000 MCG tablet   Oral   Take 1,000 mcg by mouth daily.         . cyclobenzaprine (FLEXERIL) 10 MG tablet   Oral   Take 1 tablet (10 mg total) by mouth 3 (three) times daily as needed for muscle spasms.   20 tablet   0   . diphenoxylate-atropine (LOMOTIL) 2.5-0.025 MG tablet   Oral   Take 1 tablet by mouth 4 (four) times daily as needed for diarrhea or loose stools.         . ergocalciferol (VITAMIN D2) 50000 UNITS capsule   Oral   Take 50,000 Units by mouth once a week. Reported on 02/24/2016         . ferrous sulfate 325 (65 FE) MG tablet   Oral   Take 325 mg by mouth daily with breakfast.         . fesoterodine (TOVIAZ) 8 MG TB24 tablet   Oral   Take 8 mg by mouth daily.         . insulin aspart (NOVOLOG) 100 UNIT/ML injection   Subcutaneous    Inject 3-15 Units into the skin 3 (three) times daily before meals. Patient reports using sliding scale:  BG < 140 = 0 units BG 140 to 180 = 3 units BG 180 to 220 = 4 units BG 221 to 260 = 6 units BG 261 to 320 = 8 units BG 321 to 360 = 10 units BG 361 to 400 = 12 units BG > 400 = 15 units BG > 450 = call PCP         . Insulin Glargine 300 UNIT/ML SOPN   Subcutaneous   Inject 50 Units into the skin daily. Sliding scale         . isosorbide mononitrate (IMDUR) 30 MG 24 hr tablet   Oral   Take 30 mg by mouth daily. Reported on 12/07/2015         . lisinopril (PRINIVIL,ZESTRIL) 20 MG tablet   Oral   Take 20 mg by mouth daily.         . metoprolol tartrate (LOPRESSOR) 25 MG tablet   Oral   Take 25 mg by mouth 2 (two) times daily. 2 tablets twice a day         . mirtazapine (REMERON) 15 MG tablet   Oral   Take 15 mg by mouth at bedtime.         [name] morphine (MS CONTIN) 30 MG 12 hr tablet   Oral   Take 1 tablet (30 mg total) by mouth every 12 (twelve) hours. Patient taking differently: Take 30 mg by mouth 3 (three) times daily.    30 tablet   0   . morphine (MSIR) 15 MG tablet   Oral   Take 1 tablet (15 mg total) by mouth every 6 (six) hours as needed for moderate pain or severe pain.   30 tablet   0   . Multiple Vitamin (MULTIVITAMIN) capsule   Oral   Take 1 capsule by mouth daily.         . nitrofurantoin, macrocrystal-monohydrate, (MACROBID) 100 MG capsule   Oral   Take 100 mg by mouth daily.         . nitroGLYCERIN (NITROSTAT) 0.4 MG SL tablet   Sublingual   Place 0.4 mg under the tongue every 5 (five) minutes as needed for chest pain.         [name] omeprazole (PRILOSEC)  20 MG capsule   Oral   Take 20 mg by mouth daily.         . pregabalin (LYRICA) 75 MG capsule   Oral   Take 1 capsule (75 mg total) by mouth 2 (two) times daily. Patient taking differently: Take 150 mg by mouth 2 (two) times daily.    60 capsule   0   . ticagrelor  (BRILINTA) 90 MG TABS tablet   Oral   Take 90 mg by mouth 2 (two) times daily.         . traZODone (DESYREL) 100 MG tablet   Oral   Take 100 mg by mouth at bedtime as needed for sleep.         [name] venlafaxine XR (EFFEXOR-XR) 150 MG 24 hr capsule   Oral   Take 150 mg by mouth daily with breakfast.           Allergies Codeine; Influenza vaccines; Methadone; Percocet; Tetanus toxoids; and Tetracyclines & related  Family History  Problem Relation Age of Onset  . CAD Mother   . CAD Father     Social History Social History  Substance Use Topics  . Smoking status: Never Smoker   . Smokeless tobacco: None  . Alcohol Use: No    Review of Systems Constitutional: +subjective fever/chills Eyes: No visual changes. ENT: No sore throat. Cardiovascular: Denies chest pain. Respiratory: Denies shortness of breath. Gastrointestinal: No abdominal pain.  No nausea, no vomiting.  No diarrhea.  No constipation. Genitourinary: Negative for dysuria. Musculoskeletal: Negative for back pain. Skin: Negative for rash. Neurological: Negative for headaches, focal weakness or numbness.  10-point ROS otherwise negative.  ____________________________________________   PHYSICAL EXAM:  VITAL SIGNS: ED Triage Vitals  Enc Vitals Group     BP 04/03/16 1317 125/68 mmHg     Pulse Rate 04/03/16 1317 96     Resp 04/03/16 1317 16     Temp 04/03/16 1317 98.1 F (36.7 C)     Temp Source 04/03/16 1317 Oral     SpO2 04/03/16 1317 96 %     Weight 04/03/16 1317 167 lb (75.751 kg)     Height 04/03/16 1317  (1.702 m)     Head Cir --      Peak Flow --      Pain Score 04/03/16 1321 8     Pain Loc --      Pain Edu? --      Excl. in GC? --     Constitutional: Alert and oriented. Well appearing and in no acute distress. Eyes: Conjunctivae are normal. PERRL. EOMI. Head: Atraumatic. Nose: No congestion/rhinnorhea. Mouth/Throat: Mucous membranes are moist.  Oropharynx non-erythematous. Neck:  No stridor.  Supple without meningismus. Cardiovascular: Normal rate, regular rhythm. Grossly normal heart sounds.  Good peripheral circulation. Respiratory: Normal respiratory effort.  No retractions. Lungs CTAB. Gastrointestinal: Soft and nontender. No distention. No CVA tenderness. Genitourinary: deferred Musculoskeletal: Status post amputation of the left second and third toe and the right hallux. There is an open ulceration on the dorsum of the right fourth toe which is swollen and there is surrounding erythema, induration, tenderness extending to the proximal portion of the shin. 2+ right DP pulse. Neurologic:  Normal speech and language. No gross focal neurologic deficits are appreciated. No gait instability. Skin:  Skin is warm, dry and intact. No rash noted. Psychiatric: Mood and affect are normal. Speech and behavior are normal.  ____________________________________________   LABS (all labs ordered are listed,  but only abnormal results are displayed)  Labs Reviewed  CBC WITH DIFFERENTIAL/PLATELET - Abnormal; Notable for the following:    RDW 14.7 (*)    All other components within normal limits  COMPREHENSIVE METABOLIC PANEL - Abnormal; Notable for the following:    Glucose, Bld 140 (*)    ALT 10 (*)    All other components within normal limits  CULTURE, BLOOD (ROUTINE X 2)  CULTURE, BLOOD (ROUTINE X 2)   ____________________________________________  EKG  none ____________________________________________  RADIOLOGY  Xray right foot IMPRESSION: Diffuse soft tissue swelling without radiographic evidence of underlying osteomyelitis. ____________________________________________   PROCEDURES  Procedure(s) performed: None  Critical Care performed: No  ____________________________________________   INITIAL IMPRESSION / ASSESSMENT AND PLAN / ED COURSE  Pertinent labs & imaging results that were available during my care of the patient were reviewed by me and  considered in my medical decision making (see chart for details).  Mellody DanceBarbara Solt is a 70 y.o. female with history of coronary artery disease, hypertension, diabetes, multiple toe amputations for diabetic foot wounds and osteomyelitis who presents for evaluation of redness, swelling to the right fourth toe and the top of the foot since yesterday. On exam, she is nontoxic appearing and in no acute distress. Vital signs stable, she is afebrile. Unremarkable CBC, CMP. Plain films negative for osteomyelitis. My concern is for diabetic foot ulcer with surrounding cellulitis. She is neurovascular intact in the right foot. Given her history and rapid spread of cellulitis in just 1 day, we will give IV vancomycin, Zosyn and discussed case with the hospitalist, Dr. Gershon MusselSainani,for admission at 5:15 PM. ____________________________________________   FINAL CLINICAL IMPRESSION(S) / ED DIAGNOSES  Final diagnoses:  Diabetic ulcer of right foot associated with other specified diabetes mellitus (HCC)  Cellulitis of right lower extremity      Gayla DossEryka A Wilman Tucker, MD 04/03/16 1715
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
## 2016-04-03 NOTE — Patient Outreach (Signed)
[facility] Care Management  04/03/2016  Mellody DanceBarbara Stickley 1946-04-07 [id number]   Mellody DanceBarbara Haller is a 70yo who was [facility] to [facility] for medication management.  I made home visit today to provide patient with a True Result glucometer and test strips until she can get a new prescription for a meter and strips that are covered by her insurance plan.  Upon arrival to patient's home, patient reports she received a return call from her provider, Dr. Alberteen Spindleline, regarding her foot and he advised her to go to the emergency department for possible hospital admission for IV antibiotics.  Patient arranged for transportation to the emergency department.  While awaiting transportation, I counseled patient on how to use new glucometer.  Patient checked blood glucose during visit and reading was 191 mg/dL. Patient reports she has not taken her insulin this morning (Tuojeo and Novolog sliding scale).  Identified that patient's Novolog insulin had expired.  Counseled patient to avoid using expired medications.  Offered to discard of expired Novolog pen however patient declined and wanted to hold onto Novolog pen until new prescription is filled.  Advised patient to obtain new refill of Novolog insulin when she returns home from the hospital/emergency department.  Patient voices understanding.    Plan:  Will follow up with patient in one week or following discharge from the hospital if admitted.    Lilla Shookachel Darien Kading, Pharm.D. Pharmacy Resident [facility] [phone]

## 2016-04-03 NOTE — Consult Note (Signed)
Pharmacy Antibiotic Note  Jessica Donovan is a 70 y.o. female admitted on 04/03/2016 with cellulitis with history of osteomyelitis.  Pharmacy has been consulted for vancomycin and zosyn dosing.  Plan: Vancomycin 1000mg  IV every 12 hours.  Goal trough 15-20 mcg/mL. Zosyn 3.375g IV q8h (4 hour infusion). Will check vancomycin trough prior to the 5th dose at 1030 on 4/16  Ke= 0.063 hrs-1 T1/2= 11 hrs Vd= 53L Extrapolated Cpk= 34.39, Ctr= 17.2   Height: 5\' 7"  (170.2 cm) Weight: 167 lb (75.751 kg) IBW/kg (Calculated) : 61.6  Temp (24hrs), Avg:98.1 F (36.7 C), Min:98.1 F (36.7 C), Max:98.1 F (36.7 C)   Recent Labs Lab 04/03/16 1324  WBC 7.3  CREATININE 0.65    Estimated Creatinine Clearance: 70.5 mL/min (by C-G formula based on Cr of 0.65).    Allergies  Allergen Reactions  . Codeine Nausea And Vomiting  . Influenza Vaccines Other (See Comments)    Reaction:  Caused pt to pass out   . Methadone Hives and Itching  . Percocet [Oxycodone-Acetaminophen] Nausea And Vomiting  . Tetanus Toxoids Swelling and Other (See Comments)    Reaction:  Swelling at injection site  . Tetracyclines & Related Rash    Antimicrobials this admission: Vancomycin 4/14 >>  Zosyn 4/14 >>   Microbiology results:  Thank you for allowing pharmacy to be a part of this patient's care.  Cy Blamerllison K Rashidah [place] 04/03/2016 7:15 PM

## 2016-04-03 NOTE — H&P (Signed)
[employer] [facility] [place] [facility]   PATIENT NAME: Jessica Donovan    MR#:  [id number]  DATE OF BIRTH:  [birth year]  DATE OF ADMISSION:  04/03/2016  PRIMARY CARE PHYSICIAN: Lauro Regulus., MD   REQUESTING/REFERRING PHYSICIAN: Dr. Chari Manning  CHIEF COMPLAINT:   Chief Complaint  Patient presents with  . Toe Pain    HISTORY OF PRESENT ILLNESS:  Jessica Donovan  is a 70 y.o. female with a known history of Hypertension, diabetes type 2 without complication, coronary artery disease, chronic low back pain, history of previous CVA, history of previous MI, history of previous ostial myelitis status post amputations who presents to the hospital due to right toe pain and redness and swelling. Most of her right third toe has been red and swollen over the past 3-4 days progressively getting worse. She is being followed by podiatry as an outpatient by Dr. Alberteen Spindle and she contacted him who referred her to come to the ER for further evaluation. In the emergency room patient was noted to have a right lower extremity cellulitis and therefore hospice services were contacted further treatment and evaluation. Patient does admit to chills but no documented fever. She complains of chronic low back pain. No nausea, vomiting, abdominal pain, diarrhea or any other associated symptoms presently.  PAST MEDICAL HISTORY:   Past Medical History  Diagnosis Date  . Hypertension   . Diabetes mellitus without complication (HCC)   . Coronary artery disease   . Low back pain   . Stroke (HCC)   . Myocardial infarction (HCC)   . Chronic back pain   . Osteomyelitis of toe (HCC) 01/23/2016    PAST SURGICAL HISTORY:   Past Surgical History  Procedure Laterality Date  . Toe amputation    . 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 Cardiac catheterization N/A 11/12/2015    Procedure: Left Heart  Cath and Coronary Angiography;  Surgeon: Marcina Millard, MD;  Location: [facility] INVASIVE CV LAB;  Service: Cardiovascular;  Laterality: N/A;  . Amputation toe Left 01/24/2016    Procedure: AMPUTATION TOE (2nd mpj);  Surgeon: Linus Galas, DPM;  Location: [facility] ORS;  Service: Podiatry;  Laterality: Left;    SOCIAL HISTORY:   Social History  Substance Use Topics  . Smoking status: Never Smoker   . Smokeless tobacco: Not on file  . Alcohol Use: No    FAMILY HISTORY:   Family History  Problem Relation Age of Onset  . CAD Mother   . CAD Father     DRUG ALLERGIES:   Allergies  Allergen Reactions  . Codeine Nausea And Vomiting  . Influenza Vaccines Other (See Comments)    Reaction:  Caused pt to pass out   . Methadone Hives  . Percocet [Oxycodone-Acetaminophen] Nausea And Vomiting  . Tetanus Toxoids Swelling  . Tetracyclines & Related Rash    REVIEW OF SYSTEMS:   Review of Systems  Constitutional: Negative for fever and weight loss.  HENT: Negative for congestion, nosebleeds and tinnitus.   Eyes: Negative for blurred vision, double vision and redness.  Respiratory: Negative for cough, hemoptysis and shortness of breath.   Cardiovascular: Negative for chest pain, orthopnea, leg swelling and PND.  Gastrointestinal: Negative for nausea, vomiting, abdominal pain, diarrhea and melena.  Genitourinary: Negative for dysuria, urgency and hematuria.  Musculoskeletal: Positive for back pain and joint pain (right foot/third toe). Negative  for falls.  Neurological: Positive for weakness (generalized). Negative for dizziness, tingling, sensory change, focal weakness, seizures and headaches.  Endo/Heme/Allergies: Negative for polydipsia. Does not bruise/bleed easily.  Psychiatric/Behavioral: Negative for depression and memory loss. The patient is not nervous/anxious.     MEDICATIONS AT HOME:   Prior to Admission medications   Medication Sig Start Date End Date Taking? Authorizing  Provider  acetaminophen (TYLENOL) 500 MG tablet Take 1,000 mg by mouth every 4 (four) hours as needed for mild pain or headache.    Yes Historical Provider, MD  ALPRAZolam Prudy Feeler(XANAX) 1 MG tablet Take 1 tablet (1 mg total) by mouth 3 (three) times daily as needed for anxiety. 01/27/16  Yes Enid Baasadhika Kalisetti, MD  amLODipine (NORVASC) 5 MG tablet Take 5 mg by mouth daily.   Yes Historical Provider, MD  aspirin EC 81 MG tablet Take 81 mg by mouth daily.   Yes Historical Provider, MD  atorvastatin (LIPITOR) 20 MG tablet Take 20 mg by mouth daily.   Yes Historical Provider, MD  Biotin 5 MG CAPS Take 5 mg by mouth daily at 12 noon.    Yes Historical Provider, MD  cetirizine (ZYRTEC) 10 MG tablet Take 10 mg by mouth daily.    Yes Historical Provider, MD  cyclobenzaprine (FLEXERIL) 10 MG tablet Take 1 tablet (10 mg total) by mouth 3 (three) times daily as needed for muscle spasms. 01/27/16  Yes Enid Baasadhika Kalisetti, MD  ferrous sulfate 325 (65 FE) MG tablet Take 325 mg by mouth daily with breakfast.   Yes Historical Provider, MD  fesoterodine (TOVIAZ) 8 MG TB24 tablet Take 8 mg by mouth daily.   Yes Historical Provider, MD  insulin aspart (NOVOLOG) 100 UNIT/ML injection Inject 3-15 Units into the skin 3 (three) times daily with meals as needed for high blood sugar. Pt uses as needed per sliding scale:    Less than 140:  0 units  140-180:  3 units 181-220:  4 units 221- 260:  6 units 261- 320:  8 units 321-360:  10 units 361-400:  12 units Greater than 400:  15 units   Yes Historical Provider, MD  Insulin Glargine (TOUJEO SOLOSTAR) 300 UNIT/ML SOPN Inject 50 Units into the skin daily.   Yes Historical Provider, MD  isosorbide mononitrate (IMDUR) 30 MG 24 hr tablet Take 30 mg by mouth daily.    Yes Historical Provider, MD  lisinopril (PRINIVIL,ZESTRIL) 20 MG tablet Take 20 mg by mouth daily.   Yes Historical Provider, MD  metoprolol (LOPRESSOR) 50 MG tablet Take 50 mg by mouth 2 (two) times daily.   Yes  Historical Provider, MD  mirtazapine (REMERON) 15 MG tablet Take 15 mg by mouth at bedtime.   Yes Historical Provider, MD  morphine (MS CONTIN) 30 MG 12 hr tablet Take 30 mg by mouth 3 (three) times daily.   Yes Historical Provider, MD  morphine (MSIR) 15 MG tablet Take 15 mg by mouth every 6 (six) hours as needed for severe pain.   Yes Historical Provider, MD  Multiple Vitamin (MULTIVITAMIN WITH MINERALS) TABS tablet Take 1 tablet by mouth daily.   Yes Historical Provider, MD  nitrofurantoin, macrocrystal-monohydrate, (MACROBID) 100 MG capsule Take 100 mg by mouth daily.   Yes Historical Provider, MD  nitroGLYCERIN (NITROSTAT) 0.4 MG SL tablet Place 0.4 mg under the tongue every 5 (five) minutes as needed for chest pain.   Yes Historical Provider, MD  omeprazole (PRILOSEC) 20 MG capsule Take 20 mg by mouth daily.  Yes Historical Provider, MD  pregabalin (LYRICA) 150 MG capsule Take 150 mg by mouth 2 (two) times daily.   Yes Historical Provider, MD  ticagrelor (BRILINTA) 90 MG TABS tablet Take 90 mg by mouth 2 (two) times daily.   Yes Historical Provider, MD  traZODone (DESYREL) 100 MG tablet Take 100 mg by mouth at bedtime.    Yes Historical Provider, MD  venlafaxine XR (EFFEXOR-XR) 150 MG 24 hr capsule Take 150 mg by mouth daily with breakfast.   Yes Historical Provider, MD  vitamin B-12 (CYANOCOBALAMIN) 1000 MCG tablet Take 1,000 mcg by mouth daily.   Yes Historical Provider, MD  Vitamin D, Ergocalciferol, (DRISDOL) 50000 units CAPS capsule Take 50,000 Units by mouth every 7 (seven) days. Pt takes on Sunday.   Yes Historical Provider, MD      VITAL SIGNS:  Blood pressure 137/74, pulse 74, temperature 98.1 F (36.7 C), temperature source Oral, resp. rate 16, height  (1.702 m), weight 75.751 kg (167 lb), SpO2 96 %.  PHYSICAL EXAMINATION:  Physical Exam  GENERAL:  70 y.o.-year-old patient lying in the bed with no acute distress.  EYES: Pupils equal, round, reactive to light and  accommodation. No scleral icterus. Extraocular muscles intact.  HEENT: Head atraumatic, normocephalic. Oropharynx and nasopharynx clear. No oropharyngeal erythema, dry oral mucosa NECK:  Supple, no jugular venous distention. No thyroid enlargement, no tenderness.  LUNGS: Normal breath sounds bilaterally, no wheezing, rales, rhonchi. No use of accessory muscles of respiration.  CARDIOVASCULAR: S1, S2 RRR. No murmurs, rubs, gallops, clicks.  ABDOMEN: Soft, nontender, nondistended. Bowel sounds present. No organomegaly or mass.  EXTREMITIES: No pedal edema, cyanosis, or clubbing. + 2 pedal & radial pulses b/l.  Multiple applications of toes on the left leg. Right third toe with an open area with ascending redness consistent with cellulitis. NEUROLOGIC: Cranial nerves II through XII are intact. No focal Motor or sensory deficits appreciated b/l PSYCHIATRIC: The patient is alert and oriented x 3. Good affect.  SKIN: No obvious rash, lesion, or ulcer.   LABORATORY PANEL:   CBC  Recent Labs Lab 04/03/16 1324  WBC 7.3  HGB 12.3  HCT 37.3  PLT 256   ------------------------------------------------------------------------------------------------------------------  Chemistries   Recent Labs Lab 04/03/16 1324  NA 136  K 4.2  CL 103  CO2 27  GLUCOSE 140*  BUN 20  CREATININE 0.65  CALCIUM 9.1  AST 21  ALT 10*  ALKPHOS 85  BILITOT 0.4   ------------------------------------------------------------------------------------------------------------------  Cardiac Enzymes No results for input(s): TROPONINI in the last 168 hours. ------------------------------------------------------------------------------------------------------------------  RADIOLOGY:  Dg Foot Complete Right  04/03/2016  CLINICAL DATA:  Right foot pain and swelling. Clinical concern for osteomyelitis. EXAM: RIGHT FOOT COMPLETE - 3+ VIEW COMPARISON:  None. FINDINGS: Surgical absence of the great toe. Diffuse soft  tissue swelling. No soft tissue gas, bone destruction or periosteal reaction. Calcaneal spurs. IMPRESSION: Diffuse soft tissue swelling without radiographic evidence of underlying osteomyelitis. Electronically Signed   By: Beckie Salts M.D.   On: 04/03/2016 16:49     IMPRESSION AND PLAN:   70 year old female with past medical history of diabetes, hypertension, chronic back pain, history of previous osteomyelitis and toe amputations, depression, anxiety who presents to the hospital with right third toe redness pain and ascending cellulitis.  1. Right lower extremity cellulitis-this is the cause of patient's redness, swelling and pain. -Clinically patient is afebrile, with a normal WBC count and hemodynamically stable. -I will start the patient. Vancomycin, Zosyn. Follow blood cultures. -  We'll get a podiatry consult.  2. Chronic pain syndrome-continue MS Contin, short acting morphine as needed.  3. Diabetes type 2 without complication-continue Lantus, NovoLog sliding scale.  4. Essential hypertension-hemodynamically stable continue metoprolol, lisinopril, Imdur and Norvasc.  5. History of coronary artery disease-no acute chest pain. Continue aspirin, Brilinta, beta blocker, statin.  6. Depression-continue Effexor.  7. Hyperlipidemia-continue atorvastatin.  8. GERD-continue Protonix.  All the records are reviewed and case discussed with ED provider. Management plans discussed with the patient, family and they are in agreement.  CODE STATUS: Full code  TOTAL TIME TAKING CARE OF THIS PATIENT: 45 minutes.    Houston Siren M.D on 04/03/2016 at 6:02 PM  Between 7am to 6pm - Pager - [phone]  After 6pm go to [email or web address] - password [email or web address] [facility]  [place]  [facility]  Office  [phone]  CC: Primary care physician; Lauro Regulus., MD

## 2016-04-03 NOTE — ED Notes (Signed)
States has history of toe amputations, c/o right fourth toe pain, drainage, bleeding x 1 day.

## 2016-04-03 NOTE — Patient Outreach (Signed)
[facility] Care Management  04/03/2016  Jessica DanceBarbara Donovan Sep 15, 1946 [id number]   Care coordination:  Jessica DanceBarbara Piche is a 69yo who was [facility] to [facility] for medication management.  I received a voicemail from patient regarding concern about prescription for test strips.  I made an outreach call to patient who reports she has been unable to get her test strips filled at [facility] and she is currently out of test strips.  With patient's permission, I made outreach call to [facility] and spoke to RyanStephanie.  Judeth CornfieldStephanie reports that the prescription for test strips was written for Accu-Chek test strips and patient's insurance will not cover Accu-Chek strips.  Judeth CornfieldStephanie reports covered meters and strips include One Touch, Free Style, or Precision.  Judeth CornfieldStephanie reports patient's provider was faxed yesterday to have prescription changed but they have not received a reply.  I made an attempt to call patient's provider's office but they were closed for the Easter holiday.  I will provide patient with a True Result meter and temporary supply of test strips (30 strips) until prescription is sent in for glucometer and test strips approved by patient's insurance company.   Also discussed nitrofurantoin prescription with patient.  Patient reports she has not heard anything else regarding status of prescription.  During last conversation, I advised patient to call her PCP office to check on the status of the prior authorization.  Patient reports she has not talked to PCP office regarding the antibiotic.  I made call to [facility] and spoke to KeystoneSherry who reports nitrofurantoin is still rejecting through patient's insurance.  Cordelia PenSherry resent prior authorization request.  Patient requested to have a 10-day supply of nitrofurantoin prescription filled and delivered.    Patient also reports concern about her foot.  Patient reports she has already made a call to her provider, Dr. Alberteen Spindleline,  for advice.    Plan:  Appointment scheduled for today at 11:00 AM to deliver True Result glucometer to patient.  [facility] will deliver 10-day supply of nitrofurantoin.     [name], Pharm.D. Pharmacy Resident [facility] [phone]

## 2016-04-04 DIAGNOSIS — L03031 Cellulitis of right toe: Secondary | ICD-10-CM | POA: Diagnosis not present

## 2016-04-04 DIAGNOSIS — G894 Chronic pain syndrome: Secondary | ICD-10-CM | POA: Diagnosis not present

## 2016-04-04 DIAGNOSIS — L97511 Non-pressure chronic ulcer of other part of right foot limited to breakdown of skin: Secondary | ICD-10-CM | POA: Diagnosis not present

## 2016-04-04 DIAGNOSIS — L03115 Cellulitis of right lower limb: Secondary | ICD-10-CM | POA: Diagnosis not present

## 2016-04-04 DIAGNOSIS — Z794 Long term (current) use of insulin: Secondary | ICD-10-CM | POA: Diagnosis not present

## 2016-04-04 DIAGNOSIS — E118 Type 2 diabetes mellitus with unspecified complications: Secondary | ICD-10-CM | POA: Diagnosis not present

## 2016-04-04 DIAGNOSIS — I10 Essential (primary) hypertension: Secondary | ICD-10-CM | POA: Diagnosis not present

## 2016-04-04 DIAGNOSIS — E1140 Type 2 diabetes mellitus with diabetic neuropathy, unspecified: Secondary | ICD-10-CM | POA: Diagnosis not present

## 2016-04-04 LAB — CBC
HEMATOCRIT: 33.6 % — AB (ref 35.0–47.0)
HEMOGLOBIN: 11.4 g/dL — AB (ref 12.0–16.0)
MCH: 31.4 pg (ref 26.0–34.0)
MCHC: 33.8 g/dL (ref 32.0–36.0)
MCV: 93 fL (ref 80.0–100.0)
Platelets: 211 10*3/uL (ref 150–440)
RBC: 3.61 MIL/uL — ABNORMAL LOW (ref 3.80–5.20)
RDW: 14.9 % — ABNORMAL HIGH (ref 11.5–14.5)
WBC: 4.8 10*3/uL (ref 3.6–11.0)

## 2016-04-04 LAB — BASIC METABOLIC PANEL
ANION GAP: 6 (ref 5–15)
BUN: 17 mg/dL (ref 6–20)
CALCIUM: 8.7 mg/dL — AB (ref 8.9–10.3)
CO2: 29 mmol/L (ref 22–32)
Chloride: 101 mmol/L (ref 101–111)
Creatinine, Ser: 0.52 mg/dL (ref 0.44–1.00)
GFR calc Af Amer: >60 mL/min (ref >60)
GFR calc non Af Amer: >60 mL/min (ref >60)
GLUCOSE: 122 mg/dL — AB (ref 65–99)
Potassium: 3.8 mmol/L (ref 3.5–5.1)
Sodium: 136 mmol/L (ref 135–145)

## 2016-04-04 LAB — GLUCOSE, CAPILLARY
GLUCOSE-CAPILLARY: 104 mg/dL — AB (ref 65–99)
Glucose-Capillary: 189 mg/dL — ABNORMAL HIGH (ref 65–99)

## 2016-04-04 LAB — SEDIMENTATION RATE: Sed Rate: 30 mm/hr (ref 0–30)

## 2016-04-04 MED ORDER — SULFAMETHOXAZOLE-TRIMETHOPRIM 800-160 MG PO TABS: 1.0000 | ORAL_TABLET | Freq: Two times a day (BID) | ORAL | Status: DC

## 2016-04-04 NOTE — Consult Note (Signed)
Reason for Consult: Ulceration with cellulitis right fourth toe and foot. Referring Physician: [facility]  Jessica Donovan is an 70 y.o. female.  HPI: Jessica Donovan relates recent development of some redness and swelling in her right foot with some drainage from an ulceration on the fourth toe. She did relate some chills but no clear fever prior to coming to the hospital. Does not recall any injury. 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 Spoke with her yesterday by phone and from her description recommended that she come to the emergency department for evaluation and possible admission.  Past Medical History  Diagnosis Date  . Hypertension   . Diabetes mellitus without complication ([place])   . Coronary artery disease   . Low back pain   . Stroke ([place])   . Myocardial infarction ([place])   . Chronic back pain   . Osteomyelitis of toe ([place]) 01/23/2016    Past Surgical History  Procedure Laterality Date  . Toe amputation    . Amputation toe Left 11/10/2015    Procedure: AMPUTATION TOE;  Surgeon: Sharlotte Alamo, MD;  Location: [facility] ORS;  Service: Podiatry;  Laterality: Left;  . Cardiac catheterization N/A 11/12/2015    Procedure: Left Heart Cath and Coronary Angiography;  Surgeon: Isaias Cowman, MD;  Location: [place] CV LAB;  Service: Cardiovascular;  Laterality: N/A;  . Amputation toe Left 01/24/2016    Procedure: AMPUTATION TOE (2nd mpj);  Surgeon: Sharlotte Alamo, DPM;  Location: [facility] ORS;  Service: Podiatry;  Laterality: Left;    Family History  Problem Relation Age of Onset  . CAD Mother   . CAD Father     Social History:  reports that she has never smoked. She does not have any smokeless tobacco history on file. She reports that she does not drink alcohol or use illicit drugs.  Allergies:  Allergies  Allergen Reactions  . Codeine Nausea And Vomiting  . Influenza Vaccines Other (See Comments)    Reaction:  Caused pt to pass out    . Methadone Hives and Itching  . Percocet [Oxycodone-Acetaminophen] Nausea And Vomiting  . Tetanus Toxoids Swelling and Other (See Comments)    Reaction:  Swelling at injection site  . Tetracyclines & Related Rash    Medications:  Scheduled: . amLODipine  5 mg Oral Daily  . aspirin EC  81 mg Oral Daily  . atorvastatin  20 mg Oral Daily  . Chlorhexidine Gluconate Cloth  6 each Topical Q0600  . enoxaparin (LOVENOX) injection  40 mg Subcutaneous Q24H  . ferrous sulfate  325 mg Oral Q breakfast  . fesoterodine  8 mg Oral Daily  . insulin glargine  50 Units Subcutaneous Daily  . isosorbide mononitrate  30 mg Oral Daily  . lisinopril  20 mg Oral Daily  . loratadine  10 mg Oral Daily  . metoprolol  50 mg Oral BID  . mirtazapine  15 mg Oral QHS  . morphine  30 mg Oral TID  . multivitamin with minerals  1 tablet Oral Daily  . mupirocin ointment  1 application Nasal BID  . pantoprazole  40 mg Oral Daily  . piperacillin-tazobactam (ZOSYN)  IV  3.375 g Intravenous 3 times per day  . pregabalin  150 mg Oral BID  . ticagrelor  90 mg Oral BID  . traZODone  100 mg Oral QHS  . vancomycin  1,000 mg Intravenous Q12H  . venlafaxine XR  150 mg Oral Q breakfast  .  vitamin B-12  1,000 mcg Oral Daily  . [START ON 04/05/2016] Vitamin D (Ergocalciferol)  50,000 Units Oral Q7 days    Results for orders placed or performed during the hospital encounter of 04/03/16 (from the past 48 hour(s))  CBC with Differential     Status: Abnormal   Collection Time: 04/03/16  1:24 PM  Result Value Ref Range   WBC 7.3 3.6 - 11.0 K/uL   RBC 4.01 3.80 - 5.20 MIL/uL   Hemoglobin 12.3 12.0 - 16.0 g/dL   HCT 37.3 35.0 - 47.0 %   MCV 93.1 80.0 - 100.0 fL   MCH 30.7 26.0 - 34.0 pg   MCHC 33.0 32.0 - 36.0 g/dL   RDW 14.7 (H) 11.5 - 14.5 %   Platelets 256 150 - 440 K/uL   Neutrophils Relative % 58 %   Neutro Abs 4.2 1.4 - 6.5 K/uL   Lymphocytes Relative 30 %   Lymphs Abs 2.2 1.0 - 3.6 K/uL   Monocytes Relative 9 %    Monocytes Absolute 0.7 0.2 - 0.9 K/uL   Eosinophils Relative 2 %   Eosinophils Absolute 0.2 0 - 0.7 K/uL   Basophils Relative 1 %   Basophils Absolute 0.1 0 - 0.1 K/uL  Comprehensive metabolic panel     Status: Abnormal   Collection Time: 04/03/16  1:24 PM  Result Value Ref Range   Sodium 136 135 - 145 mmol/L   Potassium 4.2 3.5 - 5.1 mmol/L   Chloride 103 101 - 111 mmol/L   CO2 27 22 - 32 mmol/L   Glucose, Bld 140 (H) 65 - 99 mg/dL   BUN 20 6 - 20 mg/dL   Creatinine, Ser 0.65 0.44 - 1.00 mg/dL   Calcium 9.1 8.9 - 10.3 mg/dL   Total Protein 7.1 6.5 - 8.1 g/dL   Albumin 4.1 3.5 - 5.0 g/dL   AST 21 15 - 41 U/L   ALT 10 (L) 14 - 54 U/L   Alkaline Phosphatase 85 38 - 126 U/L   Total Bilirubin 0.4 0.3 - 1.2 mg/dL   GFR calc non Af Amer >60 >60 mL/min   GFR calc Af Amer >60 >60 mL/min    Comment: (NOTE) The eGFR has been calculated using the CKD EPI equation. This calculation has not been validated in all clinical situations. eGFR's persistently <60 mL/min signify possible Chronic Kidney Disease.    Anion gap 6 5 - 15  MRSA PCR Screening     Status: Abnormal   Collection Time: 04/03/16  7:08 PM  Result Value Ref Range   MRSA by PCR POSITIVE (A) NEGATIVE    Comment:        The GeneXpert MRSA Assay (FDA approved for NASAL specimens only), is one component of a comprehensive MRSA colonization surveillance program. It is not intended to diagnose MRSA infection nor to guide or monitor treatment for MRSA infections. CRITICAL RESULT CALLED TO, READ BACK BY AND VERIFIED WITH:  NICKI SOLOMON AT 2140 04/03/16 SDR   Glucose, capillary     Status: Abnormal   Collection Time: 04/03/16  9:00 PM  Result Value Ref Range   Glucose-Capillary 139 (H) 65 - 99 mg/dL   Comment 1 Notify RN   Basic metabolic panel     Status: Abnormal   Collection Time: 04/04/16  4:15 AM  Result Value Ref Range   Sodium 136 135 - 145 mmol/L   Potassium 3.8 3.5 - 5.1 mmol/L   Chloride 101 101 - 111  mmol/L   CO2 29 22 - 32 mmol/L   Glucose, Bld 122 (H) 65 - 99 mg/dL   BUN 17 6 - 20 mg/dL   Creatinine, Ser 0.52 0.44 - 1.00 mg/dL   Calcium 8.7 (L) 8.9 - 10.3 mg/dL   GFR calc non Af Amer >60 >60 mL/min   GFR calc Af Amer >60 >60 mL/min    Comment: (NOTE) The eGFR has been calculated using the CKD EPI equation. This calculation has not been validated in all clinical situations. eGFR's persistently <60 mL/min signify possible Chronic Kidney Disease.    Anion gap 6 5 - 15  CBC     Status: Abnormal   Collection Time: 04/04/16  4:15 AM  Result Value Ref Range   WBC 4.8 3.6 - 11.0 K/uL   RBC 3.61 (L) 3.80 - 5.20 MIL/uL   Hemoglobin 11.4 (L) 12.0 - 16.0 g/dL   HCT 33.6 (L) 35.0 - 47.0 %   MCV 93.0 80.0 - 100.0 fL   MCH 31.4 26.0 - 34.0 pg   MCHC 33.8 32.0 - 36.0 g/dL   RDW 14.9 (H) 11.5 - 14.5 %   Platelets 211 150 - 440 K/uL  Glucose, capillary     Status: Abnormal   Collection Time: 04/04/16  7:22 AM  Result Value Ref Range   Glucose-Capillary 104 (H) 65 - 99 mg/dL  Sedimentation rate     Status: None   Collection Time: 04/04/16  8:46 AM  Result Value Ref Range   Sed Rate 30 0 - 30 mm/hr    Dg Foot Complete Right  04/03/2016  CLINICAL DATA:  Right foot pain and swelling. Clinical concern for osteomyelitis. EXAM: RIGHT FOOT COMPLETE - 3+ VIEW COMPARISON:  None. FINDINGS: Surgical absence of the great toe. Diffuse soft tissue swelling. No soft tissue gas, bone destruction or periosteal reaction. Calcaneal spurs. IMPRESSION: Diffuse soft tissue swelling without radiographic evidence of underlying osteomyelitis. Electronically Signed   By: Claudie Revering M.D.   On: 04/03/2016 16:49    Review of Systems  Constitutional: Negative for fever and chills.  HENT: Negative.   Eyes: Negative.   Respiratory: Negative.   Cardiovascular: Negative.   Gastrointestinal: Negative.   Musculoskeletal: Positive for back pain.  Skin:       Relates redness and swelling in her right foot over the  last days. Has an open sore has been draining from the fourth toe.  Neurological:       She does relate some numbness and paresthesias in the feet related to her diabetes  Endo/Heme/Allergies: Negative.   Psychiatric/Behavioral: Negative.    Blood pressure 104/59, pulse 58, temperature 96.6 F (35.9 C), temperature source Axillary, resp. rate 16, height '5\' 7"'  (1.702 m), weight 78.336 kg (172 lb 11.2 oz), SpO2 96 %. Physical Exam  Cardiovascular:  DP pulse is 2 over 4, fully palpable. PT pulses 1 over 4.  Musculoskeletal:  Previous amputation right great toe. Significant hammertoe contracture on the lesser toes.  Neurological:  Loss of protective threshold monofilament wire in the foot and toes bilateral. Proprioception is impaired  Skin:  Some erythema and edema in the right fourth toe. Previous cellulitis in the foot and leg has resolved down to the level of the fourth toe. Superficial ulceration is noted on the medial aspect with no significant drainage. There is some dry crusted blood at the distal tip. No deep extension is noted.    Assessment/Plan: Assessment: 1. Cellulitis with superficial ulceration right fourth toe. 2.  Chronic hammertoe contractures. 3. Diabetes with associated neuropathy.  Plan: Sterile gauze bandage applied to the right fourth toe. Discussed with the patient that her cellulitis appears to be significantly improved and I think that she should stable enough to be discharged on oral antibiotics and followed outpatient. She was scheduled for an MRI but we will cancel this at this point. Patient was instructed on keeping a gauze bandage on the fourth toe. We will discharge her on Bactrim DS. Patient already has a follow-up scheduled outpatient in a week and a half. Call sooner if any problems before then  Durward Fortes 04/04/2016, 11:11 AM

## 2016-04-04 NOTE — Discharge Summary (Signed)
[facility] - [place] at [facility]   PATIENT NAME: Jessica Donovan    MR#:  [id number]  DATE OF BIRTH:  Aug 23, 1946  DATE OF ADMISSION:  04/03/2016 ADMITTING PHYSICIAN: Houston Siren, MD  DATE OF DISCHARGE: 04/04/2016  PRIMARY CARE PHYSICIAN: Lauro Regulus., MD    ADMISSION DIAGNOSIS:  Cellulitis of right lower extremity [L03.115] Diabetic ulcer of right foot associated with other specified diabetes mellitus (HCC) [W29.562, L97.519]  DISCHARGE DIAGNOSIS:  Active Problems:   Cellulitis   SECONDARY DIAGNOSIS:   Past Medical History  Diagnosis Date  . Hypertension   . Diabetes mellitus without complication (HCC)   . Coronary artery disease   . Low back pain   . Stroke (HCC)   . Myocardial infarction (HCC)   . Chronic back pain   . Osteomyelitis of toe (HCC) 01/23/2016    HOSPITAL COURSE:  Jessica Donovan  is a 70 y.o. female admitted 04/03/2016 with chief complaint Toe Pain . Please see H&P performed by Houston Siren, MD for further information. She presented to the hospital with the above complaints involving right toe pain and swelling. Before coming to the hospital she had a large area of redness which has since improved with Iv antibiotics. She was evaluated by her podiatrist Dr Alberteen Spindle while in the hospital. Who noted marked improvement and recommended following up as an outpatient  DISCHARGE CONDITIONS:   stable  CONSULTS OBTAINED:  Treatment Team:  Linus Galas, DPM  DRUG ALLERGIES:   Allergies  Allergen Reactions  . Codeine Nausea And Vomiting  . Influenza Vaccines Other (See Comments)    Reaction:  Caused pt to pass out   . Methadone Hives and Itching  . Percocet [Oxycodone-Acetaminophen] Nausea And Vomiting  . Tetanus Toxoids Swelling and Other (See Comments)    Reaction:  Swelling at injection site  . Tetracyclines & Related Rash    DISCHARGE MEDICATIONS:   Current Discharge Medication List    START taking these medications     Details  sulfamethoxazole-trimethoprim (BACTRIM DS,SEPTRA DS) 800-160 MG tablet Take 1 tablet by mouth 2 (two) times daily. Qty: 20 tablet, Refills: 1      CONTINUE these medications which have NOT CHANGED   Details  acetaminophen (TYLENOL) 500 MG tablet Take 1,000 mg by mouth every 4 (four) hours as needed for mild pain or headache.     ALPRAZolam (XANAX) 1 MG tablet Take 1 tablet (1 mg total) by mouth 3 (three) times daily as needed for anxiety. Qty: 20 tablet, Refills: 0    amLODipine (NORVASC) 5 MG tablet Take 5 mg by mouth daily.    aspirin EC 81 MG tablet Take 81 mg by mouth daily.    atorvastatin (LIPITOR) 20 MG tablet Take 20 mg by mouth daily.    Biotin 5 MG CAPS Take 5 mg by mouth daily at 12 noon.     cetirizine (ZYRTEC) 10 MG tablet Take 10 mg by mouth daily.     cyclobenzaprine (FLEXERIL) 10 MG tablet Take 1 tablet (10 mg total) by mouth 3 (three) times daily as needed for muscle spasms. Qty: 20 tablet, Refills: 0    ferrous sulfate 325 (65 FE) MG tablet Take 325 mg by mouth daily with breakfast.    fesoterodine (TOVIAZ) 8 MG TB24 tablet Take 8 mg by mouth daily.    insulin aspart (NOVOLOG) 100 UNIT/ML injection Inject 3-15 Units into the skin 3 (three) times daily with meals as needed for high blood sugar. Pt  uses as needed per sliding scale:    Less than 140:  0 units  140-180:  3 units 181-220:  4 units 221- 260:  6 units 261- 320:  8 units 321-360:  10 units 361-400:  12 units Greater than 400:  15 units    Insulin Glargine (TOUJEO SOLOSTAR) 300 UNIT/ML SOPN Inject 50 Units into the skin daily.    isosorbide mononitrate (IMDUR) 30 MG 24 hr tablet Take 30 mg by mouth daily.     lisinopril (PRINIVIL,ZESTRIL) 20 MG tablet Take 20 mg by mouth daily.    metoprolol (LOPRESSOR) 50 MG tablet Take 50 mg by mouth 2 (two) times daily.    mirtazapine (REMERON) 15 MG tablet Take 15 mg by mouth at bedtime.    morphine (MS CONTIN) 30 MG 12 hr tablet Take 30 mg  by mouth 3 (three) times daily.    morphine (MSIR) 15 MG tablet Take 15 mg by mouth every 6 (six) hours as needed for severe pain.    Multiple Vitamin (MULTIVITAMIN WITH MINERALS) TABS tablet Take 1 tablet by mouth daily.    nitroGLYCERIN (NITROSTAT) 0.4 MG SL tablet Place 0.4 mg under the tongue every 5 (five) minutes as needed for chest pain.    omeprazole (PRILOSEC) 20 MG capsule Take 20 mg by mouth daily.    pregabalin (LYRICA) 150 MG capsule Take 150 mg by mouth 2 (two) times daily.    ticagrelor (BRILINTA) 90 MG TABS tablet Take 90 mg by mouth 2 (two) times daily.    traZODone (DESYREL) 100 MG tablet Take 100 mg by mouth at bedtime.     venlafaxine XR (EFFEXOR-XR) 150 MG 24 hr capsule Take 150 mg by mouth daily with breakfast.    vitamin B-12 (CYANOCOBALAMIN) 1000 MCG tablet Take 1,000 mcg by mouth daily.    Vitamin D, Ergocalciferol, (DRISDOL) 50000 units CAPS capsule Take 50,000 Units by mouth every 7 (seven) days. Pt takes on Sunday.      STOP taking these medications     nitrofurantoin, macrocrystal-monohydrate, (MACROBID) 100 MG capsule          DISCHARGE INSTRUCTIONS:    DIET:  Diabetic diet  DISCHARGE CONDITION:  Stable  ACTIVITY:  Activity as tolerated  OXYGEN:  Home Oxygen: No.   Oxygen Delivery: room air  DISCHARGE LOCATION:  home   If you experience worsening of your admission symptoms, develop shortness of breath, life threatening emergency, suicidal or homicidal thoughts you must seek medical attention immediately by calling 911 or calling your MD immediately  if symptoms less severe.  You Must read complete instructions/literature along with all the possible adverse reactions/side effects for all the Medicines you take and that have been prescribed to you. Take any new Medicines after you have completely understood and accpet all the possible adverse reactions/side effects.   Please note  You were cared for by a hospitalist during your  hospital stay. If you have any questions about your discharge medications or the care you received while you were in the hospital after you are discharged, you can call the unit and asked to speak with the hospitalist on call if the hospitalist that took care of you is not available. Once you are discharged, your primary care physician will handle any further medical issues. Please note that NO REFILLS for any discharge medications will be authorized once you are discharged, as it is imperative that you return to your primary care physician (or establish a relationship with a primary care  physician if you do not have one) for your aftercare needs so that they can reassess your need for medications and monitor your lab values.    On the day of Discharge:   VITAL SIGNS:  Blood pressure 104/59, pulse 58, temperature 96.6 F (35.9 C), temperature source Axillary, resp. rate 16, height  (1.702 m), weight 78.336 kg (172 lb 11.2 oz), SpO2 96 %.  I/O:   Intake/Output Summary (Last 24 hours) at 04/04/16 1212 Last data filed at 04/04/16 1135  Gross per 24 hour  Intake    250 ml  Output   2550 ml  Net  -2300 ml    PHYSICAL EXAMINATION:  GENERAL:  70 y.o.-year-old patient lying in the bed with no acute distress.  EYES: Pupils equal, round, reactive to light and accommodation. No scleral icterus. Extraocular muscles intact.  HEENT: Head atraumatic, normocephalic. Oropharynx and nasopharynx clear.  NECK:  Supple, no jugular venous distention. No thyroid enlargement, no tenderness.  LUNGS: Normal breath sounds bilaterally, no wheezing, rales,rhonchi or crepitation. No use of accessory muscles of respiration.  CARDIOVASCULAR: S1, S2 normal. No murmurs, rubs, or gallops.  ABDOMEN: Soft, non-tender, non-distended. Bowel sounds present. No organomegaly or mass.  EXTREMITIES: No pedal edema, cyanosis, or clubbing. Missing multiple toes NEUROLOGIC: Cranial nerves II through XII are intact. Muscle  strength 5/5 in all extremities. Sensation intact. Gait not checked.  PSYCHIATRIC: The patient is alert and oriented x 3.  SKIN: forth right toe edema/redness, ulcer on dorsal aspect, otherwise No obvious rash, lesion, or ulcer.   DATA REVIEW:   CBC  Recent Labs Lab 04/04/16 0415  WBC 4.8  HGB 11.4*  HCT 33.6*  PLT 211    Chemistries   Recent Labs Lab 04/03/16 1324 04/04/16 0415  NA 136 136  K 4.2 3.8  CL 103 101  CO2 27 29  GLUCOSE 140* 122*  BUN 20 17  CREATININE 0.65 0.52  CALCIUM 9.1 8.7*  AST 21  --   ALT 10*  --   ALKPHOS 85  --   BILITOT 0.4  --     Cardiac Enzymes No results for input(s): TROPONINI in the last 168 hours.  Microbiology Results  Results for orders placed or performed during the hospital encounter of 04/03/16  Blood culture (routine x 2)     Status: None (Preliminary result)   Collection Time: 04/03/16  5:10 PM  Result Value Ref Range Status   Specimen Description BLOOD LEFT HAND  Final   Special Requests BOTTLES DRAWN AEROBIC AND ANAEROBIC  4CC  Final   Culture NO GROWTH < 24 HOURS  Final   Report Status PENDING  Incomplete  Blood culture (routine x 2)     Status: None (Preliminary result)   Collection Time: 04/03/16  5:10 PM  Result Value Ref Range Status   Specimen Description BLOOD LEFT FOREARM  Final   Special Requests   Final    BOTTLES DRAWN AEROBIC AND ANAEROBIC  AER 9CC ANA 4CC   Culture NO GROWTH < 24 HOURS  Final   Report Status PENDING  Incomplete  MRSA PCR Screening     Status: Abnormal   Collection Time: 04/03/16  7:08 PM  Result Value Ref Range Status   MRSA by PCR POSITIVE (A) NEGATIVE Final    Comment:        The GeneXpert MRSA Assay (FDA approved for NASAL specimens only), is one component of a comprehensive MRSA colonization surveillance program. It is not intended to  diagnose MRSA infection nor to guide or monitor treatment for MRSA infections. CRITICAL RESULT CALLED TO, READ BACK BY AND VERIFIED WITH:   NICKI SOLOMON AT 2140 04/03/16 SDR     RADIOLOGY:  Dg Foot Complete Right  04/03/2016  CLINICAL DATA:  Right foot pain and swelling. Clinical concern for osteomyelitis. EXAM: RIGHT FOOT COMPLETE - 3+ VIEW COMPARISON:  None. FINDINGS: Surgical absence of the great toe. Diffuse soft tissue swelling. No soft tissue gas, bone destruction or periosteal reaction. Calcaneal spurs. IMPRESSION: Diffuse soft tissue swelling without radiographic evidence of underlying osteomyelitis. Electronically Signed   By: Beckie SaltsSteven  Reid M.D.   On: 04/03/2016 16:49     Management plans discussed with the patient, family and they are in agreement.  CODE STATUS:     Code Status Orders        Start     Ordered   04/03/16 1854  Full code   Continuous     04/03/16 1853    Code Status History    Date Active Date Inactive Code Status Order ID Comments User Context   02/23/2016 10:52 PM 02/25/2016  4:37 PM DNR [id number]  Robley FriesAbdullahi Oseni, MD Inpatient   01/24/2016  3:54 PM 01/27/2016  7:06 PM DNR [id number]  Milagros LollSrikar Sudini, MD Inpatient   01/23/2016  4:07 PM 01/23/2016  4:22 PM Full Code [id number]  Shaune PollackQing Chen, MD Inpatient   12/07/2015 10:23 PM 12/09/2015  6:58 PM Full Code [id number]  Auburn BilberryShreyang Patel, MD Inpatient   11/29/2015  5:41 PM 12/01/2015  6:33 PM DNR [id number]  Shaune PollackQing Chen, MD Inpatient   11/10/2015  9:54 AM 11/12/2015  9:39 PM DNR [id number]  Linus Galasodd Cline, MD Inpatient   11/07/2015  5:32 PM 11/10/2015  9:54 AM DNR [id number]  Altamese DillingVaibhavkumar Vachhani, MD Inpatient   11/07/2015  3:49 PM 11/07/2015  5:32 PM Full Code [id number]  Altamese DillingVaibhavkumar Vachhani, MD Inpatient      TOTAL TIME TAKING CARE OF THIS PATIENT: 28 minutes.    Carolynne Schuchard,  Mardi MainlandDavid K M.D on 04/04/2016 at 12:12 PM  Between 7am to 6pm - Pager - [phone]  After 6pm go to [email or web address] - Social research officer, governmentpassword [email or web address]  [facility] [place] [facility]  Office  [phone]  CC: Primary care physician; Lauro RegulusANDERSON,MARSHALL W., MD

## 2016-04-04 NOTE — Progress Notes (Signed)
Per Dr Alberteen Spindleline, cancel MRI of the foot.

## 2016-04-05 NOTE — Care Management Note (Signed)
Case Management Note  Patient Details  Name: Jessica Donovan MRN: [id number] Date of Birth: [birth year]  Subjective/Objective:         Call to [name] at [facility] updating her about discharge per [name] is currently open to [facility]. No new home health orders.           Action/Plan:   Expected Discharge Date:                  Expected Discharge Plan:     In-House Referral:     Discharge planning Services     Post Acute Care Choice:    Choice offered to:     DME Arranged:    DME Agency:     HH Arranged:    HH Agency:     Status of Service:     Medicare Important Message Given:    Date Medicare IM Given:    Medicare IM give by:    Date Additional Medicare IM Given:    Additional Medicare Important Message give by:     If discussed at Long Length of Stay Meetings, dates discussed:    Additional Comments:  Dina Warbington A, RN 04/05/2016, 8:41 AM

## 2016-04-06 ENCOUNTER — Other Ambulatory Visit: Payer: Self-pay | Admitting: *Deleted

## 2016-04-06 NOTE — Patient Outreach (Signed)
Attempt made to contact pt following recent hospitalization (4/14- 4/15, view in EMR - cellulitis ).   HIPPA compliant voice message left with contact number. If no response, will try again.      [name] M.   Aleana Fifita RN CCM [facility] Care Management  [phone]

## 2016-04-07 ENCOUNTER — Encounter: Payer: Self-pay | Admitting: *Deleted

## 2016-04-07 NOTE — Patient Outreach (Addendum)
Transition of care call (week 1, discharged 4/15).  Pt active with [facility].  View in EMR - recent hospitalization seen.   Returned phone call to pt to voice message left earlier by RN CM.      Pt reports on recent discharge-  pain in fourth toe/right foot better, cellulitis improved with antibiotics given. Pt reports HH RN continues to come, came today to change dressing, filled pill planner.    Pt reports the only medication change is Macrobid stopped while taking Bactrim.   Pt reports to f/u with Dr. Alberteen Spindleline 4/24.   RN CM discussed continuing to follow for transition of care, home visit scheduled 4/25.     Plan to f/u again 425- home visit, part of transition of care. Plan to update Dr. Dareen PianoAnderson- continue to follow pt for transition of care.    [name] M.   Pierzchala RN CCM [facility] Care Management  [phone]

## 2016-04-08 ENCOUNTER — Observation Stay
Admission: EM | Admit: 2016-04-08 | Discharge: 2016-04-10 | Disposition: A | Discharge status: Home / Self Care | Payer: PPO | Attending: Internal Medicine | Admitting: Internal Medicine

## 2016-04-08 ENCOUNTER — Emergency Department: Payer: PPO

## 2016-04-08 ENCOUNTER — Other Ambulatory Visit: Payer: Self-pay | Admitting: Pharmacist

## 2016-04-08 DIAGNOSIS — E11628 Type 2 diabetes mellitus with other skin complications: Secondary | ICD-10-CM | POA: Diagnosis not present

## 2016-04-08 DIAGNOSIS — M545 Low back pain: Secondary | ICD-10-CM | POA: Insufficient documentation

## 2016-04-08 DIAGNOSIS — F0390 Unspecified dementia without behavioral disturbance: Secondary | ICD-10-CM | POA: Insufficient documentation

## 2016-04-08 DIAGNOSIS — R400 Somnolence: Secondary | ICD-10-CM | POA: Diagnosis not present

## 2016-04-08 DIAGNOSIS — Z7982 Long term (current) use of aspirin: Secondary | ICD-10-CM | POA: Diagnosis not present

## 2016-04-08 DIAGNOSIS — F329 Major depressive disorder, single episode, unspecified: Secondary | ICD-10-CM | POA: Insufficient documentation

## 2016-04-08 DIAGNOSIS — F4320 Adjustment disorder, unspecified: Secondary | ICD-10-CM | POA: Diagnosis not present

## 2016-04-08 DIAGNOSIS — I2510 Atherosclerotic heart disease of native coronary artery without angina pectoris: Secondary | ICD-10-CM | POA: Insufficient documentation

## 2016-04-08 DIAGNOSIS — Z89422 Acquired absence of other left toe(s): Secondary | ICD-10-CM | POA: Diagnosis not present

## 2016-04-08 DIAGNOSIS — Z89411 Acquired absence of right great toe: Secondary | ICD-10-CM | POA: Diagnosis not present

## 2016-04-08 DIAGNOSIS — Z79899 Other long term (current) drug therapy: Secondary | ICD-10-CM | POA: Insufficient documentation

## 2016-04-08 DIAGNOSIS — Z8673 Personal history of transient ischemic attack (TIA), and cerebral infarction without residual deficits: Secondary | ICD-10-CM | POA: Diagnosis not present

## 2016-04-08 DIAGNOSIS — IMO0002 Reserved for concepts with insufficient information to code with codable children: Secondary | ICD-10-CM

## 2016-04-08 DIAGNOSIS — R5383 Other fatigue: Secondary | ICD-10-CM | POA: Insufficient documentation

## 2016-04-08 DIAGNOSIS — F419 Anxiety disorder, unspecified: Secondary | ICD-10-CM | POA: Insufficient documentation

## 2016-04-08 DIAGNOSIS — R4182 Altered mental status, unspecified: Secondary | ICD-10-CM

## 2016-04-08 DIAGNOSIS — Z794 Long term (current) use of insulin: Secondary | ICD-10-CM | POA: Insufficient documentation

## 2016-04-08 DIAGNOSIS — Z66 Do not resuscitate: Secondary | ICD-10-CM | POA: Diagnosis not present

## 2016-04-08 DIAGNOSIS — Z881 Allergy status to other antibiotic agents status: Secondary | ICD-10-CM | POA: Insufficient documentation

## 2016-04-08 DIAGNOSIS — I10 Essential (primary) hypertension: Secondary | ICD-10-CM | POA: Diagnosis not present

## 2016-04-08 DIAGNOSIS — W1830XA Fall on same level, unspecified, initial encounter: Secondary | ICD-10-CM | POA: Insufficient documentation

## 2016-04-08 DIAGNOSIS — M47892 Other spondylosis, cervical region: Secondary | ICD-10-CM | POA: Diagnosis not present

## 2016-04-08 DIAGNOSIS — R531 Weakness: Secondary | ICD-10-CM | POA: Diagnosis not present

## 2016-04-08 DIAGNOSIS — L03031 Cellulitis of right toe: Secondary | ICD-10-CM | POA: Diagnosis not present

## 2016-04-08 DIAGNOSIS — S0990XA Unspecified injury of head, initial encounter: Secondary | ICD-10-CM | POA: Diagnosis not present

## 2016-04-08 DIAGNOSIS — L03115 Cellulitis of right lower limb: Secondary | ICD-10-CM | POA: Diagnosis not present

## 2016-04-08 DIAGNOSIS — Z79891 Long term (current) use of opiate analgesic: Secondary | ICD-10-CM | POA: Diagnosis not present

## 2016-04-08 DIAGNOSIS — I959 Hypotension, unspecified: Secondary | ICD-10-CM

## 2016-04-08 DIAGNOSIS — Z885 Allergy status to narcotic agent status: Secondary | ICD-10-CM | POA: Diagnosis not present

## 2016-04-08 DIAGNOSIS — E119 Type 2 diabetes mellitus without complications: Secondary | ICD-10-CM

## 2016-04-08 DIAGNOSIS — G8929 Other chronic pain: Secondary | ICD-10-CM | POA: Insufficient documentation

## 2016-04-08 DIAGNOSIS — Z8249 Family history of ischemic heart disease and other diseases of the circulatory system: Secondary | ICD-10-CM | POA: Diagnosis not present

## 2016-04-08 DIAGNOSIS — Z9889 Other specified postprocedural states: Secondary | ICD-10-CM | POA: Insufficient documentation

## 2016-04-08 DIAGNOSIS — M79604 Pain in right leg: Secondary | ICD-10-CM | POA: Diagnosis not present

## 2016-04-08 DIAGNOSIS — L539 Erythematous condition, unspecified: Secondary | ICD-10-CM | POA: Diagnosis not present

## 2016-04-08 DIAGNOSIS — Z887 Allergy status to serum and vaccine status: Secondary | ICD-10-CM | POA: Diagnosis not present

## 2016-04-08 DIAGNOSIS — I252 Old myocardial infarction: Secondary | ICD-10-CM | POA: Diagnosis not present

## 2016-04-08 DIAGNOSIS — E1165 Type 2 diabetes mellitus with hyperglycemia: Secondary | ICD-10-CM

## 2016-04-08 DIAGNOSIS — S199XXA Unspecified injury of neck, initial encounter: Secondary | ICD-10-CM | POA: Diagnosis not present

## 2016-04-08 DIAGNOSIS — E1140 Type 2 diabetes mellitus with diabetic neuropathy, unspecified: Secondary | ICD-10-CM | POA: Insufficient documentation

## 2016-04-08 DIAGNOSIS — L0390 Cellulitis, unspecified: Secondary | ICD-10-CM | POA: Diagnosis present

## 2016-04-08 DIAGNOSIS — S299XXA Unspecified injury of thorax, initial encounter: Secondary | ICD-10-CM | POA: Diagnosis not present

## 2016-04-08 LAB — COMPREHENSIVE METABOLIC PANEL
ALT: 10 U/L — ABNORMAL LOW (ref 14–54)
ANION GAP: 8 (ref 5–15)
AST: 27 U/L (ref 15–41)
Albumin: 3.9 g/dL (ref 3.5–5.0)
Alkaline Phosphatase: 64 U/L (ref 38–126)
BUN: 29 mg/dL — ABNORMAL HIGH (ref 6–20)
CHLORIDE: 104 mmol/L (ref 101–111)
CO2: 25 mmol/L (ref 22–32)
Calcium: 9.2 mg/dL (ref 8.9–10.3)
Creatinine, Ser: 0.93 mg/dL (ref 0.44–1.00)
Glucose, Bld: 84 mg/dL (ref 65–99)
Potassium: 4.4 mmol/L (ref 3.5–5.1)
SODIUM: 137 mmol/L (ref 135–145)
Total Bilirubin: 0.2 mg/dL — ABNORMAL LOW (ref 0.3–1.2)
Total Protein: 6.3 g/dL — ABNORMAL LOW (ref 6.5–8.1)

## 2016-04-08 LAB — URINALYSIS COMPLETE WITH MICROSCOPIC (ARMC ONLY)
Bacteria, UA: NONE SEEN
Bilirubin Urine: NEGATIVE
GLUCOSE, UA: NEGATIVE mg/dL
HGB URINE DIPSTICK: NEGATIVE
KETONES UR: NEGATIVE mg/dL
NITRITE: NEGATIVE
PROTEIN: NEGATIVE mg/dL
SPECIFIC GRAVITY, URINE: 1.008 (ref 1.005–1.030)
pH: 5 (ref 5.0–8.0)

## 2016-04-08 LAB — DIFFERENTIAL
BASOS PCT: 1 %
Basophils Absolute: 0.1 10*3/uL (ref 0–0.1)
EOS ABS: 0.2 10*3/uL (ref 0–0.7)
EOS PCT: 2 %
Lymphocytes Relative: 39 %
Lymphs Abs: 3.1 10*3/uL (ref 1.0–3.6)
MONO ABS: 0.8 10*3/uL (ref 0.2–0.9)
Monocytes Relative: 10 %
Neutro Abs: 3.9 10*3/uL (ref 1.4–6.5)
Neutrophils Relative %: 48 %

## 2016-04-08 LAB — CBC
HCT: 32.9 % — ABNORMAL LOW (ref 35.0–47.0)
Hemoglobin: 11 g/dL — ABNORMAL LOW (ref 12.0–16.0)
MCH: 31.1 pg (ref 26.0–34.0)
MCHC: 33.4 g/dL (ref 32.0–36.0)
MCV: 93 fL (ref 80.0–100.0)
PLATELETS: 249 10*3/uL (ref 150–440)
RBC: 3.54 MIL/uL — ABNORMAL LOW (ref 3.80–5.20)
RDW: 14.6 % — AB (ref 11.5–14.5)
WBC: 8 10*3/uL (ref 3.6–11.0)

## 2016-04-08 LAB — CULTURE, BLOOD (ROUTINE X 2)
Culture: NO GROWTH
Culture: NO GROWTH

## 2016-04-08 LAB — PROTIME-INR
INR: 1.21
PROTHROMBIN TIME: 15.5 s — AB (ref 11.4–15.0)

## 2016-04-08 LAB — TROPONIN I

## 2016-04-08 LAB — LACTIC ACID, PLASMA: LACTIC ACID, VENOUS: 0.8 mmol/L (ref 0.5–2.0)

## 2016-04-08 LAB — APTT: aPTT: 32 seconds (ref 24–36)

## 2016-04-08 MED ORDER — SODIUM CHLORIDE 0.9 % IV BOLUS (SEPSIS)
1000.0000 mL | Freq: Once | INTRAVENOUS | Status: AC
  Administered 2016-04-08: 1000 mL via INTRAVENOUS

## 2016-04-08 MED ORDER — VANCOMYCIN HCL 10 G IV SOLR
1500.0000 mg | Freq: Once | INTRAVENOUS | Status: AC
  Administered 2016-04-08: 1500 mg via INTRAVENOUS
  Filled 2016-04-08: qty 1500

## 2016-04-08 NOTE — ED Notes (Signed)
Routine blood tubes (red, blue, lt. Green, and short lav.) were sent to the lab. RN notified

## 2016-04-08 NOTE — ED Notes (Signed)
Resumed care from terry rn.  Pt sleeping.  eaisly aroused.  nsr on monitor.  Iv in place.

## 2016-04-08 NOTE — ED Provider Notes (Signed)
[facility] Emergency Department Provider Note ____________________________________________  Time seen: Approximately 8:16 PM  I have reviewed the triage vital signs and the nursing notes.   HISTORY  Chief Complaint Fall; Weakness; Gait Problem; Altered Mental Status; and Hypotension    HPI Jessica Donovan is a 70 y.o. female history of diabetes, cad s/p stents who just discharged for right lower extremity cellulitis and osteomyelitis April 15 & now presents via EMS with confusion. She reports that she fell yesterday. She reports not feeling well yesterday afternoon and there did not sleep all night and she is unable to sleep and she doesn't feel well. She states her blood pressures are normally "good". She thinks that she fell on her rear-ended yesterday and does not think that she suffered any injury. She does report that she dropped a dresser drawer on her right foot at the site of the toe injuries. She states the redness of her leg had completely resolved until yesterday at which point it began to return. She denies any known fevers.  She was concerned and she called EMS that she might of had a TIA as she was having difficulty thinking. No focal weakness or numbness.  She is still on Bactrim for the cellulitis.  Toe wounds are followed by Dr. Alberteen Spindle, podiatry; notes from his last visit: ---- Assessment: 1. Cellulitis with superficial ulceration right fourth toe. 2. Chronic hammertoe contractures. 3. Diabetes with associated neuropathy.  Plan: Sterile gauze bandage applied to the right fourth toe. Discussed with the patient that her cellulitis appears to be significantly improved and I think that she should stable enough to be discharged on oral antibiotics and followed outpatient. She was scheduled for an MRI but we will cancel this at this point. Patient was instructed on keeping a gauze bandage on the fourth toe. We will discharge her on Bactrim DS. Patient already has  a follow-up scheduled outpatient in a week and a half. Call sooner if any problems before then  Ricci Barker 04/04/2016, 11:11 AM   Past Medical History  Diagnosis Date  . Hypertension   . Diabetes mellitus without complication (HCC)   . Coronary artery disease   . Low back pain   . Stroke (HCC)   . Myocardial infarction (HCC)   . Chronic back pain   . Osteomyelitis of toe (HCC) 01/23/2016    Patient Active Problem List   Diagnosis Date Noted  . Cellulitis 04/03/2016  . Hematoma of right parietal scalp 02/23/2016  . Multiple falls 02/23/2016  . Physical debility 02/23/2016  . DM2 (diabetes mellitus, type 2) (HCC) 02/23/2016  . Syncope and collapse 02/23/2016  . Polypharmacy 02/23/2016  . Osteomyelitis of toe (HCC) 01/23/2016  . Rhabdomyolysis 12/07/2015  . ARF (acute renal failure) (HCC) 11/29/2015  . Hypotension 11/29/2015  . 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 Angina effort (HCC) 11/07/2015  . Osteomyelitis (HCC) 11/07/2015    Past Surgical History  Procedure Laterality Date  . Toe amputation    . 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 Cardiac catheterization N/A 11/12/2015    Procedure: Left Heart Cath and Coronary Angiography;  Surgeon: Marcina Millard, MD;  Location: [facility] INVASIVE CV LAB;  Service: Cardiovascular;  Laterality: N/A;  . Amputation toe Left 01/24/2016    Procedure: AMPUTATION TOE (2nd mpj);  Surgeon: Linus Galas, DPM;  Location: [facility] ORS;  Service: Podiatry;  Laterality: Left;    Current Outpatient Rx  Name  Route  Sig  Dispense  Refill  . acetaminophen (TYLENOL) 500 MG tablet   Oral   Take 1,000 mg by mouth every 4 (four) hours as needed for mild pain or headache.          . ALPRAZolam (XANAX) 1 MG tablet   Oral   Take 1 tablet (1 mg total) by mouth 3 (three) times daily as needed for anxiety.   20 tablet   0   . aspirin EC  81 MG tablet   Oral   Take 81 mg by mouth daily.         [name]. atorvastatin (LIPITOR) 20 MG tablet   Oral   Take 20 mg by mouth daily.         . Biotin 5 MG CAPS   Oral   Take 5 mg by mouth daily at 12 noon.          . cyclobenzaprine (FLEXERIL) 10 MG tablet   Oral   Take 1 tablet (10 mg total) by mouth 3 (three) times daily as needed for muscle spasms.   20 tablet   0   . ferrous sulfate 325 (65 FE) MG tablet   Oral   Take 325 mg by mouth daily with breakfast.         . fesoterodine (TOVIAZ) 8 MG TB24 tablet   Oral   Take 8 mg by mouth daily.         . insulin aspart (NOVOLOG) 100 UNIT/ML injection   Subcutaneous   Inject 3-15 Units into the skin 3 (three) times daily with meals as needed for high blood sugar. Pt uses as needed per sliding scale:    Less than 140:  0 units  140-180:  3 units 181-220:  4 units 221- 260:  6 units 261- 320:  8 units 321-360:  10 units 361-400:  12 units Greater than 400:  15 units         . Insulin Glargine (TOUJEO SOLOSTAR) 300 UNIT/ML SOPN   Subcutaneous   Inject 50 Units into the skin daily.         . isosorbide mononitrate (IMDUR) 30 MG 24 hr tablet   Oral   Take 30 mg by mouth daily.          [name]. lisinopril (PRINIVIL,ZESTRIL) 20 MG tablet   Oral   Take 20 mg by mouth daily.         [name]. loratadine (CLARITIN) 10 MG tablet   Oral   Take 10 mg by mouth daily.         . metoprolol (LOPRESSOR) 50 MG tablet   Oral   Take 25 mg by mouth 2 (two) times daily.          . mirtazapine (REMERON) 15 MG tablet   Oral   Take 15 mg by mouth at bedtime.         [name]. morphine (MS CONTIN) 30 MG 12 hr tablet   Oral   Take 30 mg by mouth 3 (three) times daily.         [name]. morphine (MSIR) 15 MG tablet   Oral   Take 15 mg by mouth every 6 (six) hours as needed for severe pain.         . Multiple Vitamin (MULTIVITAMIN WITH MINERALS) TABS tablet   Oral   Take 1 tablet by mouth daily.         .[name]  nitroGLYCERIN  (NITROSTAT) 0.4 MG SL tablet   Sublingual   Place 0.4 mg under the tongue every 5 (five) minutes as needed for chest pain.         [name] omeprazole (PRILOSEC) 20 MG capsule   Oral   Take 20 mg by mouth daily.         . pregabalin (LYRICA) 150 MG capsule   Oral   Take 150 mg by mouth 2 (two) times daily.         [name] sulfamethoxazole-trimethoprim (BACTRIM DS,SEPTRA DS) 800-160 MG tablet   Oral   Take 1 tablet by mouth 2 (two) times daily.   20 tablet   1   . ticagrelor (BRILINTA) 90 MG TABS tablet   Oral   Take 90 mg by mouth 2 (two) times daily.         . traZODone (DESYREL) 100 MG tablet   Oral   Take 100 mg by mouth at bedtime.          [name] venlafaxine XR (EFFEXOR-XR) 150 MG 24 hr capsule   Oral   Take 150 mg by mouth daily with breakfast.         . vitamin B-12 (CYANOCOBALAMIN) 1000 MCG tablet   Oral   Take 1,000 mcg by mouth daily.         . Vitamin D, Ergocalciferol, (DRISDOL) 50000 units CAPS capsule   Oral   Take 50,000 Units by mouth every 7 (seven) days. Pt takes on Sunday.           Allergies Codeine; Influenza vaccines; Methadone; Percocet; Tetanus toxoids; and Tetracyclines & related  Family History  Problem Relation Age of Onset  . CAD Mother   . CAD Father     Social History Social History  Substance Use Topics  . Smoking status: Never Smoker   . Smokeless tobacco: Not on file  . Alcohol Use: No    Review of Systems Constitutional: No fever Cardiovascular: Denies chest pain. Respiratory: Denies shortness of breath. Gastrointestinal: No abdominal pain.no vomiting.  No diarrhea.   Neurological: Negative for headaches, focal weakness or numbness.  10-point ROS otherwise negative.  ____________________________________________   PHYSICAL EXAM:  VITAL SIGNS: Filed Vitals:   04/08/16 2139 04/08/16 2200  BP: 95/47 110/46  Pulse: 74 118  Temp:    Resp: 16 18   Constitutional: Alert and oriented. Is sleepy and is easy to arouse  but falls asleep frequently midsentence. Eyes: Conjunctivae are normal. PERRL. EOMI. Head: Atraumatic. Nose: No congestion/rhinnorhea. Mouth/Throat: Mucous membranes are moist.  Oropharynx non-erythematous. Neck: No stridor.   Cardiovascular: Normal rate, regular rhythm. Grossly normal heart sounds.  Good peripheral circulation. Respiratory: Normal respiratory effort.  No retractions. Lungs CTAB. Gastrointestinal: Soft and nontender. No distention. No abdominal bruits. No CVA tenderness. Musculoskeletal: No lower extremity tenderness nor edema.   Neurologic:  Normal speech and language. No gross focal neurologic deficits are appreciated. No gait instability. Skin:  Skin is warm, dry and intact. No rash noted. Psychiatric: Mood and affect are normal. Speech and behavior are normal.  ____________________________________________   LABS (all labs ordered are listed, but only abnormal results are displayed)  Labs Reviewed  PROTIME-INR - Abnormal; Notable for the following:    Prothrombin Time 15.5 (*)    All other components within normal limits  CBC - Abnormal; Notable for the following:    RBC 3.54 (*)    Hemoglobin 11.0 (*)    HCT 32.9 (*)    RDW  14.6 (*)    All other components within normal limits  COMPREHENSIVE METABOLIC PANEL - Abnormal; Notable for the following:    BUN 29 (*)    Total Protein 6.3 (*)    ALT 10 (*)    Total Bilirubin 0.2 (*)    All other components within normal limits  CULTURE, BLOOD (ROUTINE X 2)  CULTURE, BLOOD (ROUTINE X 2)  URINE CULTURE  APTT  DIFFERENTIAL  TROPONIN I  LACTIC ACID, PLASMA  URINALYSIS COMPLETEWITH MICROSCOPIC ([facility] ONLY)  LACTIC ACID, PLASMA   ____________________________________________  EKG  ED ECG REPORT I, Maurilio Lovely, the attending physician, personally viewed and interpreted this ECG.   Date: 04/08/2016  EKG Time: 2020  Rate: 76  Rhythm:nsr  Axis: Normal  Intervals:nl  ST&T  Change:nl  ____________________________________________  RADIOLOGY Ct head & c-spine:  Head CT: Mild age related volume loss. No focal finding.  Cervical spine CT: Mild facet degenerative change and spondylosis. No acute or traumatic finding.   Electronically Signed By: Paulina Fusi M.D. On: 04/08/2016 21:35  cxr-IMPRESSION: No acute abnormality or significant interval change.   Electronically Signed By: Marin Roberts M.D. On: 04/08/2016 21:09 ____________________________________________ ____________________________________________   INITIAL IMPRESSION / ASSESSMENT AND PLAN / ED COURSE  Pertinent labs & imaging results that were available during my care of the patient were reviewed by me and considered in my medical decision making (see chart for details).  Filed Vitals:   04/08/16 2139 04/08/16 2200  BP: 95/47 110/46  Pulse: 74 118  Temp:    Resp: 16 18   Case d/w Dr. Cherlynn Kaiser who admitted pt last time; he evaluated her toes today & feels their appearance is the same as at admission last wk.  He is concerned she is oversedated with MS contin as she has been at previous admissions.  ----------------------------------------- 10:42 PM on 04/08/2016 -----------------------------------------  Pt still very somnolent. She does arouse to voice but I am concerned with her somnolence she is not safe to go home right now. ____________________________________________   FINAL CLINICAL IMPRESSION(S) / ED DIAGNOSES  Final diagnoses:  Altered mental status, unspecified altered mental status type  Hypotension, unspecified hypotension type  Cellulitis of right lower extremity      New prescriptions started this visit New Prescriptions   No medications on file     Maurilio Lovely, MD 04/08/16 2244

## 2016-04-08 NOTE — Patient Outreach (Signed)
[facility]) Care Management  [facility]   04/08/2016  Jessica Donovan Mar 09, 1946 [id number]  Subjective: Jessica Donovan is a 69yo who was referred to [facility] for medication management.  I made follow up home visit today.  Patient reports she has not had a very good two days.    Objective:  Blood pressure: 112/69 Pulse: 82 Blood glucose: 107 mg/dL   Encounter Medications: Outpatient Encounter Prescriptions as of 04/08/2016  Medication Sig  . acetaminophen (TYLENOL) 500 MG tablet Take 1,000 mg by mouth every 4 (four) hours as needed for mild pain or headache.   . ALPRAZolam (XANAX) 1 MG tablet Take 1 tablet (1 mg total) by mouth 3 (three) times daily as needed for anxiety.  [name] aspirin EC 81 MG tablet Take 81 mg by mouth daily.  [name] atorvastatin (LIPITOR) 20 MG tablet Take 20 mg by mouth daily.  . Biotin 5 MG CAPS Take 5 mg by mouth daily at 12 noon.   . cyclobenzaprine (FLEXERIL) 10 MG tablet Take 1 tablet (10 mg total) by mouth 3 (three) times daily as needed for muscle spasms.  . ferrous sulfate 325 (65 FE) MG tablet Take 325 mg by mouth daily with breakfast.  . fesoterodine (TOVIAZ) 8 MG TB24 tablet Take 8 mg by mouth daily.  . insulin aspart (NOVOLOG) 100 UNIT/ML injection Inject 3-15 Units into the skin 3 (three) times daily with meals as needed for high blood sugar. Pt uses as needed per sliding scale:    Less than 140:  0 units  140-180:  3 units 181-220:  4 units 221- 260:  6 units 261- 320:  8 units 321-360:  10 units 361-400:  12 units Greater than 400:  15 units  . Insulin Glargine (TOUJEO SOLOSTAR) 300 UNIT/ML SOPN Inject 50 Units into the skin daily.  . isosorbide mononitrate (IMDUR) 30 MG 24 hr tablet Take 30 mg by mouth daily.   [name] lisinopril (PRINIVIL,ZESTRIL) 20 MG tablet Take 20 mg by mouth daily.  [name] loratadine (CLARITIN) 10 MG tablet Take 10 mg by mouth daily.  . metoprolol (LOPRESSOR) 50 MG tablet Take 25 mg by mouth 2 (two) times  daily.   . mirtazapine (REMERON) 15 MG tablet Take 15 mg by mouth at bedtime.  [name] morphine (MS CONTIN) 30 MG 12 hr tablet Take 30 mg by mouth 3 (three) times daily.  [name] morphine (MSIR) 15 MG tablet Take 15 mg by mouth every 6 (six) hours as needed for severe pain.  . Multiple Vitamin (MULTIVITAMIN WITH MINERALS) TABS tablet Take 1 tablet by mouth daily.  . nitroGLYCERIN (NITROSTAT) 0.4 MG SL tablet Place 0.4 mg under the tongue every 5 (five) minutes as needed for chest pain.  [name] omeprazole (PRILOSEC) 20 MG capsule Take 20 mg by mouth daily.  . pregabalin (LYRICA) 150 MG capsule Take 150 mg by mouth 2 (two) times daily.  [name] sulfamethoxazole-trimethoprim (BACTRIM DS,SEPTRA DS) 800-160 MG tablet Take 1 tablet by mouth 2 (two) times daily.  . ticagrelor (BRILINTA) 90 MG TABS tablet Take 90 mg by mouth 2 (two) times daily.  . traZODone (DESYREL) 100 MG tablet Take 100 mg by mouth at bedtime.   [name] venlafaxine XR (EFFEXOR-XR) 150 MG 24 hr capsule Take 150 mg by mouth daily with breakfast.  . vitamin B-12 (CYANOCOBALAMIN) 1000 MCG tablet Take 1,000 mcg by mouth daily.  . Vitamin D, Ergocalciferol, (DRISDOL) 50000 units CAPS capsule Take 50,000 Units by mouth every 7 (seven) days.  Pt takes on Sunday.  . [DISCONTINUED] amLODipine (NORVASC) 5 MG tablet Take 5 mg by mouth daily. Reported on 04/08/2016  . [DISCONTINUED] cetirizine (ZYRTEC) 10 MG tablet Take 10 mg by mouth daily. Reported on 04/08/2016   No facility-administered encounter medications on file as of 04/08/2016.    Functional Status: In your present state of health, do you have any difficulty performing the following activities: 04/03/2016 03/09/2016  Hearing? N N  Vision? N Y  Difficulty concentrating or making decisions? N Y  Walking or climbing stairs? N Y  Dressing or bathing? N Y  Doing errands, shopping? N Y  Quarry manager and eating ? - Y  Using the Toilet? - N  In the past six months, have you accidently leaked urine? - N  Do you have  problems with loss of bowel control? - N  Managing your Medications? - Y  Managing your Finances? - Y  Housekeeping or managing your Housekeeping? - N    Fall/Depression Screening: PHQ 2/9 Scores 03/09/2016 02/21/2016  PHQ - 2 Score 2 1  PHQ- 9 Score 4 6    Assessment: 1.  Acute complaint:  Patient reports "I think I had a small stroke yesterday."  Patient reports unsteady gait and increased confusion since yesterday.  Patient reports she called her home health nurse regarding these symptoms yesterday and her nurse advised her to go to the ED; however, patient declined.  I also advised patient to go to the ED but she again declined.  Patient denies any other signs or symptoms of stroke.  Patient's gait appears to be at her baseline.  Patient is negative for one-sided weakness, slurred speech.  Blood pressure and blood glucose normal.  Phone call was made to patient's PCP.  I spoke to [place].  Scheduled patient to see Dr. Dareen Piano on 04/10/16 at 10:45 AM which was his first available appointment.  Urgent message was sent to patient's provider as well.  [name] and I both advised patient to go to the ED if symptoms worsen.    2.  Medication management:  Reviewed medications with patient and confirmed patient is taking sulfamethoxazole-trimethoprim BID as prescribed following hospital discharge and stopped nitrofurantoin as directed following hospital discharge.  Spoke to Vernona Rieger at [employer] during patient's appointment and requested to begin pill box program for patient.  I reviewed patient's medication list with Vernona Rieger to confirm which medications will be included in the pill box.  Vernona Rieger at [facility] reports they will plan to deliver patient's pill packs at the beginning of next week.  Patient is aware that her alprazolam, morphine, Lyrica, and cyclobenzaprine will not be included in medication planner from [facility] as these medications were recently filled at [facility] and [facility] can only  include medications that were filled at their pharmacy.  Patient is aware that these medications will have to be added to the pill boxes after deliver.  Also, patient receives Brillinta from patient assistance program and is aware that Flonnie Overman will have to be added to pill boxes as well.  Assisted patient in filling her weekly pill planner.  Patient has medications in pill planner through 04/14/16.  [facility] also confirms that provider sent in new prescription for One Touch glucometer.  [facility] pharmacy will fill glucometer prescription and will deliver that with the pill boxes next week.  I confirmed patient has enough test strips for True Result glucometer until new glucometer is delivered.  Patient reports she requested refill of Novolog as advised and received  Novolog from [facility] pharmacy today.    3.  Medication assistance:  Assisted patient with [employer] form that she received in the mail.  Patient will apply for patient assistance for Lyrica and Toviaz.  Will fax the form to prescriber, Dr. Emilia BeckMcConville, to complete provider portion of application.  Informed patient that she will need to submit proof of income with application.  Patient will make a copy of her income statement and mail to me.    Plan: 1.  Called patient's PCP Dr. Dareen PianoAnderson and left an urgent message with Shanda BumpsJessica regarding patient's acute complaint of feeling like she had a stroke.  Patient was advised to go to the ED; however patient declined.  Advised patient to go to the ED if symptoms worsen.   2.  Patient will be enrolled in [facility] pill box program at the beginning of next week.  3.  Will follow up with patient within one week regarding transition to [facility] pill box program.    Lilla Shookachel Jolana Runkles, Pharm.D. Pharmacy Resident [facility] [phone]

## 2016-04-08 NOTE — ED Notes (Signed)
Patient presents to ED 1 from [facility]. Patient sustained a fall to the floor yesterday; denies hitting head. Patient reporting that when she woke up this morning that she was weak and has had difficulties with ambulation despite her walker. Patient also reports that she has felt confused today. EMS arrived and found patient to be HYPOtensive; 80/45; no interventions en route. CBG was 97. Also, patient was admitted last week for an issue with her 2nd toe on her RIGHT foot. Reports that they "wanted to amputate", however kept her in the hospital and gave IV ABX, which caused her toe to look better so they sent her home. Patient advises that she pulled a drawer out too far last night and it fell on her toes; (+) bleeding noted.

## 2016-04-09 DIAGNOSIS — R400 Somnolence: Secondary | ICD-10-CM | POA: Diagnosis present

## 2016-04-09 DIAGNOSIS — R531 Weakness: Secondary | ICD-10-CM | POA: Diagnosis not present

## 2016-04-09 DIAGNOSIS — I959 Hypotension, unspecified: Secondary | ICD-10-CM | POA: Diagnosis not present

## 2016-04-09 DIAGNOSIS — F4320 Adjustment disorder, unspecified: Secondary | ICD-10-CM | POA: Diagnosis not present

## 2016-04-09 DIAGNOSIS — F0390 Unspecified dementia without behavioral disturbance: Secondary | ICD-10-CM | POA: Diagnosis not present

## 2016-04-09 LAB — GLUCOSE, CAPILLARY
GLUCOSE-CAPILLARY: 51 mg/dL — AB (ref 65–99)
GLUCOSE-CAPILLARY: 130 mg/dL — AB (ref 65–99)
Glucose-Capillary: 64 mg/dL — ABNORMAL LOW (ref 65–99)
Glucose-Capillary: 67 mg/dL (ref 65–99)
Glucose-Capillary: 74 mg/dL (ref 65–99)
Glucose-Capillary: 83 mg/dL (ref 65–99)
Glucose-Capillary: 125 mg/dL — ABNORMAL HIGH (ref 65–99)
Glucose-Capillary: 160 mg/dL — ABNORMAL HIGH (ref 65–99)
Glucose-Capillary: 115 mg/dL — ABNORMAL HIGH (ref 65–99)

## 2016-04-09 LAB — BLOOD GAS, ARTERIAL
ALLENS TEST (PASS/FAIL): POSITIVE — AB
Acid-base deficit: 1.5 mmol/L (ref 0.0–2.0)
BICARBONATE: 23.7 meq/L (ref 21.0–28.0)
FIO2: 0.21
O2 Saturation: 91.7 %
PCO2 ART: 41 mmHg (ref 32.0–48.0)
PH ART: 7.37 (ref 7.350–7.450)
PO2 ART: 65 mmHg — AB (ref 83.0–108.0)
Patient temperature: 37

## 2016-04-09 LAB — AMMONIA: Ammonia: 34 umol/L (ref 9–35)

## 2016-04-09 LAB — CBC
HCT: 36.3 % (ref 35.0–47.0)
Hemoglobin: 12.1 g/dL (ref 12.0–16.0)
MCH: 31.2 pg (ref 26.0–34.0)
MCHC: 33.2 g/dL (ref 32.0–36.0)
MCV: 93.9 fL (ref 80.0–100.0)
PLATELETS: 226 10*3/uL (ref 150–440)
RBC: 3.87 MIL/uL (ref 3.80–5.20)
RDW: 14.8 % — AB (ref 11.5–14.5)
WBC: 6.2 10*3/uL (ref 3.6–11.0)

## 2016-04-09 LAB — BASIC METABOLIC PANEL
Anion gap: 6 (ref 5–15)
BUN: 22 mg/dL — AB (ref 6–20)
CHLORIDE: 109 mmol/L (ref 101–111)
CO2: 26 mmol/L (ref 22–32)
CREATININE: 0.71 mg/dL (ref 0.44–1.00)
Calcium: 9 mg/dL (ref 8.9–10.3)
GFR calc Af Amer: >60 mL/min (ref >60)
GFR calc non Af Amer: >60 mL/min (ref >60)
Glucose, Bld: 83 mg/dL (ref 65–99)
Potassium: 4.3 mmol/L (ref 3.5–5.1)
Sodium: 141 mmol/L (ref 135–145)

## 2016-04-09 LAB — URINE DRUG SCREEN, QUALITATIVE (ARMC ONLY)
AMPHETAMINES, UR SCREEN: NOT DETECTED
BARBITURATES, UR SCREEN: NOT DETECTED
Benzodiazepine, Ur Scrn: POSITIVE — AB
COCAINE METABOLITE, UR ~~LOC~~: NOT DETECTED
Cannabinoid 50 Ng, Ur ~~LOC~~: NOT DETECTED
MDMA (ECSTASY) UR SCREEN: NOT DETECTED
METHADONE SCREEN, URINE: NOT DETECTED
Opiate, Ur Screen: POSITIVE — AB
Phencyclidine (PCP) Ur S: NOT DETECTED
TRICYCLIC, UR SCREEN: POSITIVE — AB

## 2016-04-09 MED ORDER — ENOXAPARIN SODIUM 40 MG/0.4ML ~~LOC~~ SOLN
40.0000 mg | SUBCUTANEOUS | Status: DC
  Filled 2016-04-09: qty 0.4

## 2016-04-09 MED ORDER — MORPHINE SULFATE (PF) 2 MG/ML IV SOLN
2.0000 mg | Freq: Once | INTRAVENOUS | Status: AC
  Administered 2016-04-09: 2 mg via INTRAVENOUS
  Filled 2016-04-09: qty 1

## 2016-04-09 MED ORDER — INSULIN ASPART 100 UNIT/ML ~~LOC~~ SOLN
0.0000 [IU] | Freq: Three times a day (TID) | SUBCUTANEOUS | Status: DC
  Administered 2016-04-09: 2 [IU] via SUBCUTANEOUS
  Administered 2016-04-09 – 2016-04-10 (×2): 1 [IU] via SUBCUTANEOUS
  Filled 2016-04-09 (×2): qty 1
  Filled 2016-04-09: qty 2

## 2016-04-09 MED ORDER — MORPHINE SULFATE ER 15 MG PO TBCR
15.0000 mg | EXTENDED_RELEASE_TABLET | Freq: Two times a day (BID) | ORAL | Status: DC
  Administered 2016-04-09: 03:00:00 15 mg via ORAL
  Filled 2016-04-09 (×2): qty 1

## 2016-04-09 MED ORDER — ALPRAZOLAM 0.25 MG PO TABS
0.2500 mg | ORAL_TABLET | Freq: Three times a day (TID) | ORAL | Status: DC | PRN
  Administered 2016-04-09: 0.25 mg via ORAL
  Filled 2016-04-09: qty 1

## 2016-04-09 MED ORDER — PANTOPRAZOLE SODIUM 40 MG PO TBEC
40.0000 mg | DELAYED_RELEASE_TABLET | Freq: Every day | ORAL | Status: DC
  Administered 2016-04-09 – 2016-04-10 (×2): 40 mg via ORAL
  Filled 2016-04-09 (×2): qty 1

## 2016-04-09 MED ORDER — PREGABALIN 75 MG PO CAPS
150.0000 mg | ORAL_CAPSULE | Freq: Two times a day (BID) | ORAL | Status: DC
  Administered 2016-04-09 – 2016-04-10 (×3): 150 mg via ORAL
  Filled 2016-04-09 (×4): qty 2

## 2016-04-09 MED ORDER — TICAGRELOR 90 MG PO TABS
90.0000 mg | ORAL_TABLET | Freq: Two times a day (BID) | ORAL | Status: DC
  Administered 2016-04-09 – 2016-04-10 (×3): 90 mg via ORAL
  Filled 2016-04-09 (×5): qty 1

## 2016-04-09 MED ORDER — MIRTAZAPINE 15 MG PO TABS
15.0000 mg | ORAL_TABLET | Freq: Every day | ORAL | Status: DC
  Administered 2016-04-09: 22:00:00 15 mg via ORAL
  Filled 2016-04-09: qty 1

## 2016-04-09 MED ORDER — VENLAFAXINE HCL ER 75 MG PO CP24
150.0000 mg | ORAL_CAPSULE | Freq: Every day | ORAL | Status: DC
  Administered 2016-04-09 – 2016-04-10 (×2): 150 mg via ORAL
  Filled 2016-04-09 (×2): qty 2

## 2016-04-09 MED ORDER — OXYCODONE HCL 5 MG PO TABS
10.0000 mg | ORAL_TABLET | Freq: Four times a day (QID) | ORAL | Status: DC | PRN
  Administered 2016-04-09 – 2016-04-10 (×2): 10 mg via ORAL
  Filled 2016-04-09 (×2): qty 2

## 2016-04-09 MED ORDER — SULFAMETHOXAZOLE-TRIMETHOPRIM 800-160 MG PO TABS
1.0000 | ORAL_TABLET | Freq: Two times a day (BID) | ORAL | Status: DC
  Administered 2016-04-09 – 2016-04-10 (×4): 1 via ORAL
  Filled 2016-04-09 (×5): qty 1

## 2016-04-09 MED ORDER — TRAZODONE HCL 50 MG PO TABS
50.0000 mg | ORAL_TABLET | Freq: Every day | ORAL | Status: DC
  Administered 2016-04-09: 50 mg via ORAL
  Filled 2016-04-09: qty 1

## 2016-04-09 MED ORDER — ASPIRIN EC 81 MG PO TBEC
81.0000 mg | DELAYED_RELEASE_TABLET | Freq: Every day | ORAL | Status: DC
  Administered 2016-04-09 – 2016-04-10 (×2): 81 mg via ORAL
  Filled 2016-04-09 (×2): qty 1

## 2016-04-09 MED ORDER — MORPHINE SULFATE 15 MG PO TABS
15.0000 mg | ORAL_TABLET | Freq: Three times a day (TID) | ORAL | Status: DC | PRN
  Administered 2016-04-09 – 2016-04-10 (×2): 15 mg via ORAL
  Filled 2016-04-09 (×2): qty 1

## 2016-04-09 MED ORDER — ALPRAZOLAM 1 MG PO TABS
1.0000 mg | ORAL_TABLET | Freq: Three times a day (TID) | ORAL | Status: DC | PRN
  Administered 2016-04-09: 1 mg via ORAL
  Filled 2016-04-09: qty 1

## 2016-04-09 MED ORDER — MORPHINE SULFATE ER 30 MG PO TBCR
30.0000 mg | EXTENDED_RELEASE_TABLET | Freq: Once | ORAL | Status: AC
  Administered 2016-04-09: 22:00:00 30 mg via ORAL
  Filled 2016-04-09: qty 1

## 2016-04-09 MED ORDER — MORPHINE SULFATE 15 MG PO TABS: 15.0000 mg | ORAL_TABLET | Freq: Three times a day (TID) | ORAL | Status: DC | PRN

## 2016-04-09 MED ORDER — INSULIN ASPART 100 UNIT/ML ~~LOC~~ SOLN: 0.0000 [IU] | Freq: Every day | SUBCUTANEOUS | Status: DC

## 2016-04-09 MED ORDER — ATORVASTATIN CALCIUM 20 MG PO TABS
20.0000 mg | ORAL_TABLET | Freq: Every day | ORAL | Status: DC
  Administered 2016-04-09 – 2016-04-10 (×2): 20 mg via ORAL
  Filled 2016-04-09 (×2): qty 1

## 2016-04-09 MED ORDER — LISINOPRIL 20 MG PO TABS
20.0000 mg | ORAL_TABLET | Freq: Every day | ORAL | Status: DC
  Administered 2016-04-09 – 2016-04-10 (×2): 20 mg via ORAL
  Filled 2016-04-09 (×2): qty 1

## 2016-04-09 MED ORDER — SODIUM CHLORIDE 0.9% FLUSH
3.0000 mL | Freq: Two times a day (BID) | INTRAVENOUS | Status: DC
  Administered 2016-04-09 – 2016-04-10 (×4): 3 mL via INTRAVENOUS

## 2016-04-09 MED ORDER — ISOSORBIDE MONONITRATE ER 60 MG PO TB24
30.0000 mg | ORAL_TABLET | Freq: Every day | ORAL | Status: DC
  Administered 2016-04-10: 30 mg via ORAL
  Filled 2016-04-09 (×2): qty 1

## 2016-04-09 NOTE — Consult Note (Signed)
[facility] Face-to-Face Psychiatry Consult   Reason for Consult:  Consult for 70 year old woman with a history of anxiety and depression. Consultation because of possible overmedication. Referring Physician:  Judeen Hammans Patient Identification: Jessica Donovan MRN:  [id number] Principal Diagnosis: Dementia Diagnosis:   Patient Active Problem List   Diagnosis Date Noted  . Somnolence [R40.0] 04/09/2016  . Dementia [F03.90] 04/09/2016  . Cellulitis [L03.90] 04/03/2016  . Hematoma of right parietal scalp [S00.03XA] 02/23/2016  . Multiple falls [R29.6] 02/23/2016  . Physical debility [R53.81] 02/23/2016  . DM2 (diabetes mellitus, type 2) ([place]) [E11.9] 02/23/2016  . Syncope and collapse [R55] 02/23/2016  . Polypharmacy [Z79.899] 02/23/2016  . Osteomyelitis of toe ([place]) [M86.9] 01/23/2016  . Rhabdomyolysis [M62.82] 12/07/2015  . ARF (acute renal failure) ([place]) [N17.9] 11/29/2015  . Hypotension [I95.9] 11/29/2015  . Diabetic osteomyelitis ([place]) [E11.69, M86.9] 11/07/2015  . Angina effort ([place]) [I20.8] 11/07/2015  . Osteomyelitis ([place]) [M86.9] 11/07/2015    Total Time spent with patient: 1 hour  Subjective:   [name] is a 70 y.o. female patient admitted with "I'm better than when I came in".  HPI:  Patient interviewed. Chart reviewed. Older Notes reviewed. This 70 year old woman was brought into the hospital because of a spell of altered mental status. She tells me that she had what she thinks is "a mini stroke". She describes feeling uncoordinated like she couldn't stand up feeling confused having memory problems. She remains convinced that this was related to some kind of stroke. She denies that she had taken excessive or different amounts of medication than usual. Patient reports that she is feeling better now pretty much back to her baseline. She reports that her mood has been fairly stable. A little bit low but not severely depressed. She says that she normally sleeps fairly well despite having  sleep apnea. Appetite is been adequate. She denies having any suicidal thoughts. She feels like she has resigned herself to the possibility of death with her multiple medical problems but is wanting to get the best treatment she can and has no wish to die. Denies having any hallucinations or psychotic symptoms. Patient insists that she manages her medications well. She says that she has assistance from home health and laying out her medicines for her now. Patient is prescribed Xanax 1 mg 4 times a day although she says she only takes it 3 times a day. She also has several narcotic prescriptions that she takes chronically.  Social history: Patient is a widow. She tells me that she's been living recently at [facility]. She says that she does not live fully independently anymore.  Medical history: Diabetes and has had to have several toe amputations as a result of poor wound healing. History of cellulitis.  Substance abuse history: Denies any history whatsoever of alcohol or drug abuse and denies any intentional miss use of any of her prescription medicine.  Past Psychiatric History: Patient had psychiatric treatment after her husband died in 03-04-2009. She has been prescribed medication for depression and for anxiety. No history of suicide attempts. No history of inpatient psychiatric treatment. She appears to be on venlafaxine and Xanax chronically.  Risk to Self: Is patient at risk for suicide?: No Risk to Others:   Prior Inpatient Therapy:   Prior Outpatient Therapy:    Past Medical History:  Past Medical History  Diagnosis Date  . Hypertension   . Diabetes mellitus without complication ([place])   . Coronary artery disease   . Low back pain   .  Stroke ([place])   . Myocardial infarction ([place])   . Chronic back pain   . Osteomyelitis of toe ([place]) 01/23/2016    Past Surgical History  Procedure Laterality Date  . Toe amputation    . Amputation toe Left 11/10/2015    Procedure: AMPUTATION TOE;   Surgeon: Sharlotte Alamo, MD;  Location: [facility] ORS;  Service: Podiatry;  Laterality: Left;  . Cardiac catheterization N/A 11/12/2015    Procedure: Left Heart Cath and Coronary Angiography;  Surgeon: Isaias Cowman, MD;  Location: [place] [facility];  Service: Cardiovascular;  Laterality: N/A;  . Amputation toe Left 01/24/2016    Procedure: AMPUTATION TOE (2nd mpj);  Surgeon: Sharlotte Alamo, DPM;  Location: [facility] ORS;  Service: Podiatry;  Laterality: Left;   Family History:  Family History  Problem Relation Age of Onset  . CAD Mother   . CAD Father    Family Psychiatric  History: Patient denies there being any family history of mental health problems Social History:  History  Alcohol Use No     History  Drug Use No    Social History   Social History  . Marital Status: Widowed    Spouse Name: N/A  . Number of Children: N/A  . Years of Education: N/A   Social History Main Topics  . Smoking status: Never Smoker   . Smokeless tobacco: None  . Alcohol Use: No  . Drug Use: No  . Sexual Activity: Not Currently   Other Topics Concern  . None   Social History Narrative   Additional Social History:    Allergies:   Allergies  Allergen Reactions  . Codeine Nausea And Vomiting  . Influenza Vaccines Other (See Comments)    Reaction:  Caused pt to pass out   . Methadone Hives and Itching  . Percocet [Oxycodone-Acetaminophen] Nausea And Vomiting  . Tetanus Toxoids Swelling and Other (See Comments)    Reaction:  Swelling at injection site  . Tetracyclines & Related Rash    Labs:  Results for orders placed or performed during the hospital encounter of 04/08/16 (from the past 48 hour(s))  Protime-INR     Status: Abnormal   Collection Time: 04/08/16  8:25 PM  Result Value Ref Range   Prothrombin Time 15.5 (H) 11.4 - 15.0 seconds   INR 1.21   APTT     Status: None   Collection Time: 04/08/16  8:25 PM  Result Value Ref Range   aPTT 32 24 - 36 seconds  CBC     Status: Abnormal    Collection Time: 04/08/16  8:25 PM  Result Value Ref Range   WBC 8.0 3.6 - 11.0 K/uL   RBC 3.54 (L) 3.80 - 5.20 MIL/uL   Hemoglobin 11.0 (L) 12.0 - 16.0 g/dL   HCT 32.9 (L) 35.0 - 47.0 %   MCV 93.0 80.0 - 100.0 fL   MCH 31.1 26.0 - 34.0 pg   MCHC 33.4 32.0 - 36.0 g/dL   RDW 14.6 (H) 11.5 - 14.5 %   Platelets 249 150 - 440 K/uL  Differential     Status: None   Collection Time: 04/08/16  8:25 PM  Result Value Ref Range   Neutrophils Relative % 48 %   Neutro Abs 3.9 1.4 - 6.5 K/uL   Lymphocytes Relative 39 %   Lymphs Abs 3.1 1.0 - 3.6 K/uL   Monocytes Relative 10 %   Monocytes Absolute 0.8 0.2 - 0.9 K/uL   Eosinophils Relative 2 %  Eosinophils Absolute 0.2 0 - 0.7 K/uL   Basophils Relative 1 %   Basophils Absolute 0.1 0 - 0.1 K/uL  Comprehensive metabolic panel     Status: Abnormal   Collection Time: 04/08/16  8:25 PM  Result Value Ref Range   Sodium 137 135 - 145 mmol/L   Potassium 4.4 3.5 - 5.1 mmol/L   Chloride 104 101 - 111 mmol/L   CO2 25 22 - 32 mmol/L   Glucose, Bld 84 65 - 99 mg/dL   BUN 29 (H) 6 - 20 mg/dL   Creatinine, Ser 0.93 0.44 - 1.00 mg/dL   Calcium 9.2 8.9 - 10.3 mg/dL   Total Protein 6.3 (L) 6.5 - 8.1 g/dL   Albumin 3.9 3.5 - 5.0 g/dL   AST 27 15 - 41 U/L   ALT 10 (L) 14 - 54 U/L   Alkaline Phosphatase 64 38 - 126 U/L   Total Bilirubin 0.2 (L) 0.3 - 1.2 mg/dL   GFR calc non Af Amer >60 >60 mL/min   GFR calc Af Amer >60 >60 mL/min    Comment: (NOTE) The eGFR has been calculated using the CKD EPI equation. This calculation has not been validated in all clinical situations. eGFR's persistently <60 mL/min signify possible Chronic Kidney Disease.    Anion gap 8 5 - 15  Troponin I     Status: None   Collection Time: 04/08/16  8:25 PM  Result Value Ref Range   Troponin I <0.03 <0.031 ng/mL    Comment:        NO INDICATION OF MYOCARDIAL INJURY.   Culture, blood (routine x 2)     Status: None (Preliminary result)   Collection Time: 04/08/16  9:15 PM   Result Value Ref Range   Specimen Description BLOOD RIGHT ANTECUBITAL    Special Requests BOTTLES DRAWN AEROBIC AND ANAEROBIC 10ML    Culture NO GROWTH < 24 HOURS    Report Status PENDING   Culture, blood (routine x 2)     Status: None (Preliminary result)   Collection Time: 04/08/16  9:15 PM  Result Value Ref Range   Specimen Description BLOOD RIGHT HAND    Special Requests BOTTLES DRAWN AEROBIC AND ANAEROBIC 10ML    Culture NO GROWTH < 24 HOURS    Report Status PENDING   Lactic acid, plasma     Status: None   Collection Time: 04/08/16  9:18 PM  Result Value Ref Range   Lactic Acid, Venous 0.8 0.5 - 2.0 mmol/L  Urine culture     Status: None (Preliminary result)   Collection Time: 04/08/16 10:35 PM  Result Value Ref Range   Specimen Description URINE, RANDOM    Special Requests NONE    Culture NO GROWTH < 12 HOURS    Report Status PENDING   Urinalysis complete, with microscopic     Status: Abnormal   Collection Time: 04/08/16 10:35 PM  Result Value Ref Range   Color, Urine STRAW (A) YELLOW   APPearance HAZY (A) CLEAR   Glucose, UA NEGATIVE NEGATIVE mg/dL   Bilirubin Urine NEGATIVE NEGATIVE   Ketones, ur NEGATIVE NEGATIVE mg/dL   Specific Gravity, Urine 1.008 1.005 - 1.030   Hgb urine dipstick NEGATIVE NEGATIVE   pH 5.0 5.0 - 8.0   Protein, ur NEGATIVE NEGATIVE mg/dL   Nitrite NEGATIVE NEGATIVE   Leukocytes, UA 1+ (A) NEGATIVE   RBC / HPF 0-5 0 - 5 RBC/hpf   WBC, UA 6-30 0 - 5 WBC/hpf  Bacteria, UA NONE SEEN NONE SEEN   Squamous Epithelial / LPF 0-5 (A) NONE SEEN  Glucose, capillary     Status: Abnormal   Collection Time: 04/09/16  2:19 AM  Result Value Ref Range   Glucose-Capillary 64 (L) 65 - 99 mg/dL  Glucose, capillary     Status: None   Collection Time: 04/09/16  2:28 AM  Result Value Ref Range   Glucose-Capillary 67 65 - 99 mg/dL  Glucose, capillary     Status: Abnormal   Collection Time: 04/09/16  2:43 AM  Result Value Ref Range   Glucose-Capillary 51 (L)  65 - 99 mg/dL  Glucose, capillary     Status: None   Collection Time: 04/09/16  2:46 AM  Result Value Ref Range   Glucose-Capillary 74 65 - 99 mg/dL  Basic metabolic panel     Status: Abnormal   Collection Time: 04/09/16  2:52 AM  Result Value Ref Range   Sodium 141 135 - 145 mmol/L   Potassium 4.3 3.5 - 5.1 mmol/L   Chloride 109 101 - 111 mmol/L   CO2 26 22 - 32 mmol/L   Glucose, Bld 83 65 - 99 mg/dL   BUN 22 (H) 6 - 20 mg/dL   Creatinine, Ser 0.71 0.44 - 1.00 mg/dL   Calcium 9.0 8.9 - 10.3 mg/dL   GFR calc non Af Amer >60 >60 mL/min   GFR calc Af Amer >60 >60 mL/min    Comment: (NOTE) The eGFR has been calculated using the CKD EPI equation. This calculation has not been validated in all clinical situations. eGFR's persistently <60 mL/min signify possible Chronic Kidney Disease.    Anion gap 6 5 - 15  CBC     Status: Abnormal   Collection Time: 04/09/16  2:52 AM  Result Value Ref Range   WBC 6.2 3.6 - 11.0 K/uL   RBC 3.87 3.80 - 5.20 MIL/uL   Hemoglobin 12.1 12.0 - 16.0 g/dL   HCT 36.3 35.0 - 47.0 %   MCV 93.9 80.0 - 100.0 fL   MCH 31.2 26.0 - 34.0 pg   MCHC 33.2 32.0 - 36.0 g/dL   RDW 14.8 (H) 11.5 - 14.5 %   Platelets 226 150 - 440 K/uL  Glucose, capillary     Status: None   Collection Time: 04/09/16  3:06 AM  Result Value Ref Range   Glucose-Capillary 83 65 - 99 mg/dL  Glucose, capillary     Status: Abnormal   Collection Time: 04/09/16  7:40 AM  Result Value Ref Range   Glucose-Capillary 125 (H) 65 - 99 mg/dL  Ammonia     Status: None   Collection Time: 04/09/16  7:51 AM  Result Value Ref Range   Ammonia 34 9 - 35 umol/L  Blood gas, arterial     Status: Abnormal   Collection Time: 04/09/16  9:50 AM  Result Value Ref Range   FIO2 0.21    pH, Arterial 7.37 7.350 - 7.450   pCO2 arterial 41 32.0 - 48.0 mmHg   pO2, Arterial 65 (L) 83.0 - 108.0 mmHg   Bicarbonate 23.7 21.0 - 28.0 mEq/L   Acid-base deficit 1.5 0.0 - 2.0 mmol/L   O2 Saturation 91.7 %   Patient  temperature 37.0    Collection site LEFT RADIAL    Sample type ARTERIAL DRAW    Allens test (pass/fail) POSITIVE (A) PASS  Glucose, capillary     Status: Abnormal   Collection Time: 04/09/16 11:32 AM  Result  Value Ref Range   Glucose-Capillary 160 (H) 65 - 99 mg/dL  Glucose, capillary     Status: Abnormal   Collection Time: 04/09/16  4:43 PM  Result Value Ref Range   Glucose-Capillary 115 (H) 65 - 99 mg/dL    Current Facility-Administered Medications  Medication Dose Route Frequency Provider Last Rate Last Dose  . ALPRAZolam (XANAX) tablet 0.25 mg  0.25 mg Oral TID PRN Theodoro Grist, MD      . aspirin EC tablet 81 mg  81 mg Oral Daily Lance Coon, MD   81 mg at 04/09/16 0901  . atorvastatin (LIPITOR) tablet 20 mg  20 mg Oral Daily Lance Coon, MD   20 mg at 04/09/16 0902  . enoxaparin (LOVENOX) injection 40 mg  40 mg Subcutaneous Q24H Lance Coon, MD      . insulin aspart (novoLOG) injection 0-5 Units  0-5 Units Subcutaneous QHS Lance Coon, MD      . insulin aspart (novoLOG) injection 0-9 Units  0-9 Units Subcutaneous TID WC Lance Coon, MD   2 Units at 04/09/16 1300  . isosorbide mononitrate (IMDUR) 24 hr tablet 30 mg  30 mg Oral Daily Lance Coon, MD   30 mg at 04/09/16 0901  . lisinopril (PRINIVIL,ZESTRIL) tablet 20 mg  20 mg Oral Daily Lance Coon, MD   20 mg at 04/09/16 0902  . morphine (MSIR) tablet 15 mg  15 mg Oral Q8H PRN Theodoro Grist, MD      . oxyCODONE (Oxy IR/ROXICODONE) immediate release tablet 10 mg  10 mg Oral Q6H PRN Theodoro Grist, MD   10 mg at 04/09/16 1535  . pantoprazole (PROTONIX) EC tablet 40 mg  40 mg Oral Daily Lance Coon, MD   40 mg at 04/09/16 0901  . pregabalin (LYRICA) capsule 150 mg  150 mg Oral BID Lance Coon, MD   150 mg at 04/09/16 0902  . sodium chloride flush (NS) 0.9 % injection 3 mL  3 mL Intravenous Q12H Lance Coon, MD   3 mL at 04/09/16 0902  . sulfamethoxazole-trimethoprim (BACTRIM DS,SEPTRA DS) 800-160 MG per tablet 1 tablet  1  tablet Oral BID Lance Coon, MD   1 tablet at 04/09/16 [phone]  . ticagrelor (BRILINTA) tablet 90 mg  90 mg Oral BID Lance Coon, MD   90 mg at 04/09/16 8099  . venlafaxine XR (EFFEXOR-XR) 24 hr capsule 150 mg  150 mg Oral Q breakfast Lance Coon, MD   150 mg at 04/09/16 0845    Musculoskeletal: Strength & Muscle Tone: decreased Gait & Station: unable to stand Patient leans: N/A  Psychiatric Specialty Exam: Review of Systems  HENT: Negative.   Eyes: Negative.   Respiratory: Negative.   Cardiovascular: Negative.   Gastrointestinal: Negative.   Musculoskeletal: Positive for falls.  Skin: Negative.   Neurological: Positive for sensory change and weakness.  Psychiatric/Behavioral: Positive for memory loss. Negative for depression, suicidal ideas, hallucinations and substance abuse. The patient is not nervous/anxious and does not have insomnia.     Blood pressure 121/50, pulse 79, temperature 98.1 F (36.7 C), temperature source Axillary, resp. rate 20, height '5\' 7"'  (1.702 m), weight 75.751 kg (167 lb), SpO2 96 %.Body mass index is 26.15 kg/(m^2).  General Appearance: Fairly Groomed  Engineer, water::  Good  Speech:  Slow  Volume:  Increased  Mood:  Euthymic  Affect:  Constricted  Thought Process:  Circumstantial  Orientation:  Full (Time, Place, and Person)  Thought Content:  Negative  Suicidal Thoughts:  No  Homicidal Thoughts:  No  Memory:  Immediate;   Good Recent;   Poor Remote;   Fair  Judgement:  Fair  Insight:  Fair  Psychomotor Activity:  Decreased  Concentration:  Fair  Recall:  [employer] of Knowledge:Fair  Language: Fair  Akathisia:  No  Handed:  Right  AIMS (if indicated):     Assets:  Communication Skills Desire for Improvement Financial Resources/Insurance  ADL's:  Impaired  Cognition: Impaired,  Mild  Sleep:      Treatment Plan Summary: Medication management and Plan 70 year old woman with a past history of depression but who presents this time with  somnolence and oversedation. It seems very likely that the high doses of multiple sedating medicines that she takes contributed to her presentation. Since being in the hospital her Xanax dose has been cut down to 25% of what it was before. She seems to be functioning much better and is not complaining of anxiety in particular when I saw her. Her insight into this is poor. When I tried to discuss how medications can affect her memory and cause sedation she reacted strongly against this insisting that she was not "high" although I had made it clear that this was not what I was implying. Patient has some degree of dementia which I suspect is probably baseline. Mild but definitely there. Poor memory at just a few minutes. She is very rambling in her speech and has a hard time staying on topic. I'm certain that she would be disorganized and have trouble managing her complicated medicines. Patient does not require inpatient psychiatric treatment. I strongly recommend that her medicines be managed by someone else who keeps an eye on it to make sure she is not accidentally overdosing and I am very much in favor of cutting down her Xanax to the current low dose of 0.25 mg rather than the 1 mg she was taking previously. I will follow as needed.  Disposition: Patient does not meet criteria for psychiatric inpatient admission. Supportive therapy provided about ongoing stressors.  Alethia Berthold, MD 04/09/2016 7:45 PM

## 2016-04-09 NOTE — Progress Notes (Signed)
Inpatient Diabetes Program Recommendations  AACE/ADA: New Consensus Statement on Inpatient Glycemic Control (2015)  Target Ranges:  Prepandial:   less than 140 mg/dL      Peak postprandial:   less than 180 mg/dL (1-2 hours)      Critically ill patients:  140 - 180 mg/dL   Review of Glycemic Control  Results for Mellody DanceGLASS, Jessica Donovan (MRN [id number]) as of 04/09/2016 15:48  Ref. Range 04/09/2016 02:43 04/09/2016 02:46 04/09/2016 03:06 04/09/2016 07:40 04/09/2016 11:32  Glucose-Capillary Latest Ref Range: 65-99 mg/dL 51 (L) 74 83 914125 (H) 782160 (H)    Diabetes history: Type 2 Outpatient Diabetes medications: Novolog 3-15 units tid with meals, Toujeo 50 units q day Current orders for Inpatient glycemic control: Novolog 0-9 units tid with meals, Novolog 0-5 units qhs  Inpatient Diabetes Program Recommendations:  Agree with current orders for blood sugar management.  Will likely need to restart Lantus once blood sugars improve. A1C 6.6% on 02/23/16  Susette RacerJulie Lynwood Kubisiak, RN, BA, MHA, CDE Diabetes Coordinator Inpatient Diabetes Program  [phone] (Team Pager) [phone] [facility] Office) 04/09/2016 3:50 PM

## 2016-04-09 NOTE — H&P (Signed)
[facility] [place] at [facility]   PATIENT NAME: Jessica Donovan    MR#:  [id number]  DATE OF BIRTH:  [birth year]  DATE OF ADMISSION:  04/08/2016  PRIMARY CARE PHYSICIAN: Lauro Regulus., MD   REQUESTING/REFERRING PHYSICIAN: Glenetta Hew, MD  CHIEF COMPLAINT:   Chief Complaint  Patient presents with  . Fall  . Weakness  . Gait Problem  . Altered Mental Status  . Hypotension    HISTORY OF PRESENT ILLNESS:  Jessica Donovan  is a 70 y.o. female who presents with Somnolence and low blood pressure. Patient was recently admitted here for a right toe cellulitis. She comes back today with nonspecific complaints of some lethargy and some memory problems. Initially there was some suspicion of her right cellulitis being a source for sepsis causing her somnolence, however her workup in the ED did not support this. Hospitals were called for admission due to the fact that the patient lives alone and it was felt that she was too lethargic and somnolent to be safe at home by herself.  PAST MEDICAL HISTORY:   Past Medical History  Diagnosis Date  . Hypertension   . Diabetes mellitus without complication (HCC)   . Coronary artery disease   . Low back pain   . Stroke (HCC)   . Myocardial infarction (HCC)   . Chronic back pain   . Osteomyelitis of toe (HCC) 01/23/2016    PAST SURGICAL HISTORY:   Past Surgical History  Procedure Laterality Date  . Toe amputation    . 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 Cardiac catheterization N/A 11/12/2015    Procedure: Left Heart Cath and Coronary Angiography;  Surgeon: Marcina Millard, MD;  Location: [facility] INVASIVE CV LAB;  Service: Cardiovascular;  Laterality: N/A;  . Amputation toe Left 01/24/2016    Procedure: AMPUTATION TOE (2nd mpj);  Surgeon: Linus Galas, DPM;  Location: [facility] ORS;  Service: Podiatry;   Laterality: Left;    SOCIAL HISTORY:   Social History  Substance Use Topics  . Smoking status: Never Smoker   . Smokeless tobacco: Not on file  . Alcohol Use: No    FAMILY HISTORY:   Family History  Problem Relation Age of Onset  . CAD Mother   . CAD Father     DRUG ALLERGIES:   Allergies  Allergen Reactions  . Codeine Nausea And Vomiting  . Influenza Vaccines Other (See Comments)    Reaction:  Caused pt to pass out   . Methadone Hives and Itching  . Percocet [Oxycodone-Acetaminophen] Nausea And Vomiting  . Tetanus Toxoids Swelling and Other (See Comments)    Reaction:  Swelling at injection site  . Tetracyclines & Related Rash    MEDICATIONS AT HOME:   Prior to Admission medications   Medication Sig Start Date End Date Taking? Authorizing Provider  acetaminophen (TYLENOL) 500 MG tablet Take 1,000 mg by mouth every 4 (four) hours as needed for mild pain or headache.    Yes Historical Provider, MD  ALPRAZolam Prudy Feeler) 1 MG tablet Take 1 tablet (1 mg total) by mouth 3 (three) times daily as needed for anxiety. 01/27/16  Yes Enid Baas, MD  aspirin EC 81 MG tablet Take 81 mg by mouth daily.   Yes Historical Provider, MD  atorvastatin (LIPITOR) 20 MG tablet Take 20 mg by mouth daily.   Yes Historical Provider, MD  Biotin  5 MG CAPS Take 5 mg by mouth daily at 12 noon.    Yes Historical Provider, MD  cyclobenzaprine (FLEXERIL) 10 MG tablet Take 1 tablet (10 mg total) by mouth 3 (three) times daily as needed for muscle spasms. 01/27/16  Yes Enid Baas, MD  ferrous sulfate 325 (65 FE) MG tablet Take 325 mg by mouth daily with breakfast.   Yes Historical Provider, MD  fesoterodine (TOVIAZ) 8 MG TB24 tablet Take 8 mg by mouth daily.   Yes Historical Provider, MD  insulin aspart (NOVOLOG) 100 UNIT/ML injection Inject 3-15 Units into the skin 3 (three) times daily with meals as needed for high blood sugar. Pt uses as needed per sliding scale:    Less than 140:  0 units   140-180:  3 units 181-220:  4 units 221- 260:  6 units 261- 320:  8 units 321-360:  10 units 361-400:  12 units Greater than 400:  15 units   Yes Historical Provider, MD  Insulin Glargine (TOUJEO SOLOSTAR) 300 UNIT/ML SOPN Inject 50 Units into the skin daily.   Yes Historical Provider, MD  isosorbide mononitrate (IMDUR) 30 MG 24 hr tablet Take 30 mg by mouth daily.    Yes Historical Provider, MD  lisinopril (PRINIVIL,ZESTRIL) 20 MG tablet Take 20 mg by mouth daily.   Yes Historical Provider, MD  loratadine (CLARITIN) 10 MG tablet Take 10 mg by mouth daily.   Yes Historical Provider, MD  metoprolol (LOPRESSOR) 50 MG tablet Take 25 mg by mouth 2 (two) times daily.    Yes Historical Provider, MD  mirtazapine (REMERON) 15 MG tablet Take 15 mg by mouth at bedtime.   Yes Historical Provider, MD  morphine (MS CONTIN) 30 MG 12 hr tablet Take 30 mg by mouth 3 (three) times daily.   Yes Historical Provider, MD  morphine (MSIR) 15 MG tablet Take 15 mg by mouth every 6 (six) hours as needed for severe pain.   Yes Historical Provider, MD  Multiple Vitamin (MULTIVITAMIN WITH MINERALS) TABS tablet Take 1 tablet by mouth daily.   Yes Historical Provider, MD  nitroGLYCERIN (NITROSTAT) 0.4 MG SL tablet Place 0.4 mg under the tongue every 5 (five) minutes as needed for chest pain.   Yes Historical Provider, MD  omeprazole (PRILOSEC) 20 MG capsule Take 20 mg by mouth daily.   Yes Historical Provider, MD  pregabalin (LYRICA) 150 MG capsule Take 150 mg by mouth 2 (two) times daily.   Yes Historical Provider, MD  sulfamethoxazole-trimethoprim (BACTRIM DS,SEPTRA DS) 800-160 MG tablet Take 1 tablet by mouth 2 (two) times daily. 04/04/16  Yes Linus Galas, DPM  ticagrelor (BRILINTA) 90 MG TABS tablet Take 90 mg by mouth 2 (two) times daily.   Yes Historical Provider, MD  traZODone (DESYREL) 100 MG tablet Take 100 mg by mouth at bedtime.    Yes Historical Provider, MD  venlafaxine XR (EFFEXOR-XR) 150 MG 24 hr capsule Take  150 mg by mouth daily with breakfast.   Yes Historical Provider, MD  vitamin B-12 (CYANOCOBALAMIN) 1000 MCG tablet Take 1,000 mcg by mouth daily.   Yes Historical Provider, MD  Vitamin D, Ergocalciferol, (DRISDOL) 50000 units CAPS capsule Take 50,000 Units by mouth every 7 (seven) days. Pt takes on Sunday.   Yes Historical Provider, MD    REVIEW OF SYSTEMS:  Review of Systems  Constitutional: Negative for fever, chills, weight loss and malaise/fatigue.  HENT: Negative for ear pain, hearing loss and tinnitus.   Eyes: Negative for blurred vision,  double vision, pain and redness.  Respiratory: Negative for cough, hemoptysis and shortness of breath.   Cardiovascular: Negative for chest pain, palpitations, orthopnea and leg swelling.  Gastrointestinal: Negative for nausea, vomiting, abdominal pain, diarrhea and constipation.  Genitourinary: Negative for dysuria, frequency and hematuria.  Musculoskeletal: Negative for back pain, joint pain and neck pain.  Skin:       Right toe cellulitis, No acne  Neurological: Negative for dizziness, tremors, focal weakness and weakness.       Somnolence  Endo/Heme/Allergies: Negative for polydipsia. Does not bruise/bleed easily.  Psychiatric/Behavioral: Negative for depression. The patient is not nervous/anxious and does not have insomnia.      VITAL SIGNS:   Filed Vitals:   04/08/16 2139 04/08/16 2200 04/08/16 2230 04/08/16 2323  BP: 95/47 110/46 114/58 106/59  Pulse: 74 118 73 75  Temp:      TempSrc:      Resp: Height:      Weight:      SpO2: 91% 92% 96% 94%   Wt Readings from Last 3 Encounters:  04/08/16 75.751 kg (167 lb)  04/03/16 78.336 kg (172 lb 11.2 oz)  03/09/16 73.029 kg (161 lb)    PHYSICAL EXAMINATION:  Physical Exam  Vitals reviewed. Constitutional: She is oriented to person, place, and time. She appears well-developed and well-nourished. No distress.  HENT:  Head: Normocephalic and atraumatic.  Mouth/Throat:  Oropharynx is clear and moist.  Eyes: Conjunctivae and EOM are normal. Pupils are equal, round, and reactive to light. No scleral icterus.  Neck: Normal range of motion. Neck supple. No JVD present. No thyromegaly present.  Cardiovascular: Normal rate, regular rhythm and intact distal pulses.  Exam reveals no gallop and no friction rub.   No murmur heard. Respiratory: Effort normal and breath sounds normal. No respiratory distress. She has no wheezes. She has no rales.  GI: Soft. Bowel sounds are normal. She exhibits no distension. There is no tenderness.  Musculoskeletal: Normal range of motion. She exhibits no edema.  No arthritis, no gout  Lymphadenopathy:    She has no cervical adenopathy.  Neurological: She is oriented to person, place, and time. No cranial nerve deficit.  Somnolent, but easily arousable to simple verbal stimuli. Is able to carry on a conversation.  No dysarthria, no aphasia  Skin: Skin is warm and dry. No rash noted. There is erythema (Right second and fourth digits of the lower extremity).  Psychiatric: She has a normal mood and affect. Her behavior is normal. Judgment and thought content normal.    LABORATORY PANEL:   CBC  Recent Labs Lab 04/08/16 2025  WBC 8.0  HGB 11.0*  HCT 32.9*  PLT 249   ------------------------------------------------------------------------------------------------------------------  Chemistries   Recent Labs Lab 04/08/16 2025  NA 137  K 4.4  CL 104  CO2 25  GLUCOSE 84  BUN 29*  CREATININE 0.93  CALCIUM 9.2  AST 27  ALT 10*  ALKPHOS 64  BILITOT 0.2*   ------------------------------------------------------------------------------------------------------------------  Cardiac Enzymes  Recent Labs Lab 04/08/16 2025  TROPONINI <0.03   ------------------------------------------------------------------------------------------------------------------  RADIOLOGY:  Dg Chest 1 View  04/08/2016  CLINICAL DATA:  Fall  yesterday. Altered mental status. Weakness and difficulty ambulating. EXAM: CHEST  1 VIEW COMPARISON:  One-view chest x-ray 12/07/2015 FINDINGS: Lung volumes are low. The heart size is normal. There is no edema or effusion to suggest failure. No focal airspace disease is present. IMPRESSION: No acute abnormality or significant interval change. Electronically Signed  By: Marin Roberts M.D.   On: 04/08/2016 21:09   Ct Head Wo Contrast  04/08/2016  CLINICAL DATA:  Larey Seat to the floor yesterday.  Weakness. EXAM: CT HEAD WITHOUT CONTRAST CT CERVICAL SPINE WITHOUT CONTRAST TECHNIQUE: Multidetector CT imaging of the head and cervical spine was performed following the standard protocol without intravenous contrast. Multiplanar CT image reconstructions of the cervical spine were also generated. COMPARISON:  02/24/2016.  02/23/2016. FINDINGS: CT HEAD FINDINGS The brain shows mild generalized atrophy. There is no evidence of old or acute focal small or large vessel infarction. No mass lesion, hemorrhage, hydrocephalus or extra-axial collection. The calvarium is unremarkable. Sinuses, middle ears and mastoids are clear. CT CERVICAL SPINE FINDINGS No evidence of fracture or traumatic malalignment. There is facet arthropathy on the right at C3-4 and on the left at C2-3. There is ordinary degenerative spondylosis at C4-5 and C5-6. IMPRESSION: Head CT:  Mild age related volume loss.  No focal finding. Cervical spine CT: Mild facet degenerative change and spondylosis. No acute or traumatic finding. Electronically Signed   By: Paulina Fusi M.D.   On: 04/08/2016 21:35   Ct Cervical Spine Wo Contrast  04/08/2016  CLINICAL DATA:  Larey Seat to the floor yesterday.  Weakness. EXAM: CT HEAD WITHOUT CONTRAST CT CERVICAL SPINE WITHOUT CONTRAST TECHNIQUE: Multidetector CT imaging of the head and cervical spine was performed following the standard protocol without intravenous contrast. Multiplanar CT image reconstructions of the  cervical spine were also generated. COMPARISON:  02/24/2016.  02/23/2016. FINDINGS: CT HEAD FINDINGS The brain shows mild generalized atrophy. There is no evidence of old or acute focal small or large vessel infarction. No mass lesion, hemorrhage, hydrocephalus or extra-axial collection. The calvarium is unremarkable. Sinuses, middle ears and mastoids are clear. CT CERVICAL SPINE FINDINGS No evidence of fracture or traumatic malalignment. There is facet arthropathy on the right at C3-4 and on the left at C2-3. There is ordinary degenerative spondylosis at C4-5 and C5-6. IMPRESSION: Head CT:  Mild age related volume loss.  No focal finding. Cervical spine CT: Mild facet degenerative change and spondylosis. No acute or traumatic finding. Electronically Signed   By: Paulina Fusi M.D.   On: 04/08/2016 21:35   US Venous Img Lower Unilateral Right  04/08/2016  CLINICAL DATA:  Right lower extremity erythema after a fall yesterday. Difficulty with ambulation. EXAM: Right LOWER EXTREMITY VENOUS DOPPLER ULTRASOUND TECHNIQUE: Gray-scale sonography with graded compression, as well as color Doppler and duplex ultrasound were performed to evaluate the lower extremity deep venous systems from the level of the common femoral vein and including the common femoral, femoral, profunda femoral, popliteal and calf veins including the posterior tibial, peroneal and gastrocnemius veins when visible. The superficial great saphenous vein was also interrogated. Spectral Doppler was utilized to evaluate flow at rest and with distal augmentation maneuvers in the common femoral, femoral and popliteal veins. COMPARISON:  None. FINDINGS: Contralateral Common Femoral Vein: Respiratory phasicity is normal and symmetric with the symptomatic side. No evidence of thrombus. Normal compressibility. Common Femoral Vein: No evidence of thrombus. Normal compressibility, respiratory phasicity and response to augmentation. Saphenofemoral Junction: No  evidence of thrombus. Normal compressibility and flow on color Doppler imaging. Profunda Femoral Vein: No evidence of thrombus. Normal compressibility and flow on color Doppler imaging. Femoral Vein: No evidence of thrombus. Normal compressibility, respiratory phasicity and response to augmentation. Popliteal Vein: No evidence of thrombus. Normal compressibility, respiratory phasicity and response to augmentation. Calf Veins: No evidence of thrombus. Normal compressibility and  flow on color Doppler imaging. Superficial Great Saphenous Vein: No evidence of thrombus. Normal compressibility and flow on color Doppler imaging. Venous Reflux:  None. Other Findings:  None. IMPRESSION: No evidence of deep venous thrombosis. Electronically Signed   By: Burman NievesWilliam  Stevens M.D.   On: 04/08/2016 23:11    EKG:   Orders placed or performed during the hospital encounter of 04/08/16  . EKG 12-Lead  . EKG 12-Lead  . ED EKG  . ED EKG    IMPRESSION AND PLAN:  Principal Problem:   Somnolence - Unclear etiology of chronic, though some suspicion polypharmacy (see below), or simply a lot of narcotic use at home. Patient does state that she's been having more pain recently, though denies taking any more narcotics than prescribed. That being said she does have a relatively high-dose of narcotics prescribed to her. We will monitor here overnight and see if she is more awake tomorrow. We will reduce her dose of narcotics. Active Problems:   Polypharmacy - we will hold many of her potentially sedating medications while she is here.   Cellulitis - continue by mouth Bactrim   Hypotension - improved with fluids in the ED, currently normotensive.   DM2 (diabetes mellitus, type 2) (HCC) - on a scale insulin with corresponding glucose checks and carb modified diet  All the records are reviewed and case discussed with ED provider. Management plans discussed with the patient and/or family.  DVT PROPHYLAXIS: SubQ lovenox  GI  PROPHYLAXIS: PPI  ADMISSION STATUS: Observation  CODE STATUS: Full Code Status History    Date Active Date Inactive Code Status Order ID Comments User Context   04/03/2016  6:53 PM 04/04/2016  6:17 PM Full Code [id number]  [place] SirenVivek J Sainani, MD Inpatient   02/23/2016 10:52 PM 02/25/2016  4:37 PM DNR [id number]  Robley FriesAbdullahi Oseni, MD Inpatient   01/24/2016  3:54 PM 01/27/2016  7:06 PM DNR [id number]  Milagros LollSrikar Sudini, MD Inpatient   01/23/2016  4:07 PM 01/23/2016  4:22 PM Full Code [id number]  Shaune PollackQing Chen, MD Inpatient   12/07/2015 10:23 PM 12/09/2015  6:58 PM Full Code [id number]  Auburn BilberryShreyang Patel, MD Inpatient   11/29/2015  5:41 PM 12/01/2015  6:33 PM DNR [id number]  Shaune PollackQing Chen, MD Inpatient   11/10/2015  9:54 AM 11/12/2015  9:39 PM DNR [id number]  Linus Galasodd Cline, MD Inpatient   11/07/2015  5:32 PM 11/10/2015  9:54 AM DNR [id number]  Altamese DillingVaibhavkumar Vachhani, MD Inpatient   11/07/2015  3:49 PM 11/07/2015  5:32 PM Full Code [id number]  Altamese DillingVaibhavkumar Vachhani, MD Inpatient    Advance Directive Documentation        Most Recent Value   Type of Advance Directive  Out of facility DNR (pink MOST or yellow form)   Pre-existing out of facility DNR order (yellow form or pink MOST form)  Yellow form placed in chart (order not valid for inpatient use)   "MOST" Form in Place?        TOTAL TIME TAKING CARE OF THIS PATIENT: 40 minutes.    Paeton Latouche FIELDING 04/09/2016, 12:35 AM  [facility]  Office  [phone]  CC: Primary care physician; Lauro RegulusANDERSON,MARSHALL W., MD

## 2016-04-09 NOTE — ED Notes (Signed)
Pt waiting on admission.  Pt alert.  Iv in place.  nsr on monitor.

## 2016-04-09 NOTE — Progress Notes (Signed)
[facility]   PATIENT NAME: Jessica Donovan    MR#:  [id number]  DATE OF BIRTH:  04/30/1946  SUBJECTIVE:  CHIEF COMPLAINT:   Chief Complaint  Patient presents with  . Fall  . Weakness  . Gait Problem  . Altered Mental Status  . Hypotension   patient is a 70 year old Caucasian female with past medical history significant for history of essential hypertension, diabetes mellitus, coronary artery disease, low back pain, stroke, or cellulitis of toe who presents to the hospital with complaints of lethargy, weakness. Patient lives alone, unable to return back home. Due to significant somnolence. Apparently patient is on high doses of Xanax and morphine, and these medications have been continued. The patient remains very somnolent and not able to provide review of systems, denies any shortness of breath or chest pain  Review of Systems  Unable to perform ROS: medical condition    VITAL SIGNS: Blood pressure 100/50, pulse 76, temperature 97.8 F (36.6 C), temperature source Oral, resp. rate 20, height  (1.702 m), weight 75.751 kg (167 lb), SpO2 94 %.  PHYSICAL EXAMINATION:   GENERAL:  70 y.o.-year-old patient lying in the bed with no acute distress. Very somnolent, however, able to open her eyes and feet liquids, conversive, then drifts back to sleep EYES: Pupils equal, round, reactive to light and accommodation. No scleral icterus. Extraocular muscles intact.  HEENT: Head atraumatic, normocephalic. Oropharynx and nasopharynx clear.  NECK:  Supple, no jugular venous distention. No thyroid enlargement, no tenderness.  LUNGS: Normal breath sounds bilaterally, no wheezing, rales,rhonchi or crepitation. No use of accessory muscles of respiration.  CARDIOVASCULAR: S1, S2 normal. No murmurs, rubs, or gallops.  ABDOMEN: Soft, nontender, nondistended. Bowel sounds present. No organomegaly or mass.  EXTREMITIES: No pedal edema, cyanosis, or  clubbing.  NEUROLOGIC: Cranial nerves II through XII are intact. Muscle strength 5/5 in all extremities. Sensation intact. Gait not checked.  PSYCHIATRIC: The patient is sleepy, able to open her eyes, conversive, answers questions appropriately. But then drifts asleep SKIN: No obvious rash, lesion, or ulcer.   ORDERS/RESULTS REVIEWED:   CBC  Recent Labs Lab 04/03/16 1324 04/04/16 0415 04/08/16 2025 04/09/16 0252  WBC 7.3 4.8 8.0 6.2  HGB 12.3 11.4* 11.0* 12.1  HCT 37.3 33.6* 32.9* 36.3  PLT 256 211 249 226  MCV 93.1 93.0 93.0 93.9  MCH 30.7 31.4 31.1 31.2  MCHC 33.0 33.8 33.4 33.2  RDW 14.7* 14.9* 14.6* 14.8*  LYMPHSABS 2.2  --  3.1  --   MONOABS 0.7  --  0.8  --   EOSABS 0.2  --  0.2  --   BASOSABS 0.1  --  0.1  --    ------------------------------------------------------------------------------------------------------------------  Chemistries   Recent Labs Lab 04/03/16 1324 04/04/16 0415 04/08/16 2025 04/09/16 0252  NA 136 136 137 141  K 4.2 3.8 4.4 4.3  CL 103 101 104 109  CO2 GLUCOSE 140* 122* 84 83  BUN 20 17 29* 22*  CREATININE 0.65 0.52 0.93 0.71  CALCIUM 9.1 8.7* 9.2 9.0  AST 21  --  27  --   ALT 10*  --  10*  --   ALKPHOS 85  --  64  --   BILITOT 0.4  --  0.2*  --    ------------------------------------------------------------------------------------------------------------------ estimated creatinine clearance is 70.5 mL/min (by C-G formula based on Cr of 0.71). ------------------------------------------------------------------------------------------------------------------ No results for input(s): TSH, T4TOTAL, T3FREE, THYROIDAB in  the last 72 hours.  Invalid input(s): FREET3  Cardiac Enzymes  Recent Labs Lab 04/08/16 2025  TROPONINI <0.03   ------------------------------------------------------------------------------------------------------------------ Invalid input(s):  POCBNP ---------------------------------------------------------------------------------------------------------------  RADIOLOGY: Dg Chest 1 View  04/08/2016  CLINICAL DATA:  Fall yesterday. Altered mental status. Weakness and difficulty ambulating. EXAM: CHEST  1 VIEW COMPARISON:  One-view chest x-ray 12/07/2015 FINDINGS: Lung volumes are low. The heart size is normal. There is no edema or effusion to suggest failure. No focal airspace disease is present. IMPRESSION: No acute abnormality or significant interval change. Electronically Signed   By: Marin Robertshristopher  Mattern M.D.   On: 04/08/2016 21:09   Ct Head Wo Contrast  04/08/2016  CLINICAL DATA:  Larey SeatFell to the floor yesterday.  Weakness. EXAM: CT HEAD WITHOUT CONTRAST CT CERVICAL SPINE WITHOUT CONTRAST TECHNIQUE: Multidetector CT imaging of the head and cervical spine was performed following the standard protocol without intravenous contrast. Multiplanar CT image reconstructions of the cervical spine were also generated. COMPARISON:  02/24/2016.  02/23/2016. FINDINGS: CT HEAD FINDINGS The brain shows mild generalized atrophy. There is no evidence of old or acute focal small or large vessel infarction. No mass lesion, hemorrhage, hydrocephalus or extra-axial collection. The calvarium is unremarkable. Sinuses, middle ears and mastoids are clear. CT CERVICAL SPINE FINDINGS No evidence of fracture or traumatic malalignment. There is facet arthropathy on the right at C3-4 and on the left at C2-3. There is ordinary degenerative spondylosis at C4-5 and C5-6. IMPRESSION: Head CT:  Mild age related volume loss.  No focal finding. Cervical spine CT: Mild facet degenerative change and spondylosis. No acute or traumatic finding. Electronically Signed   By: Paulina FusiMark  Shogry M.D.   On: 04/08/2016 21:35   Ct Cervical Spine Wo Contrast  04/08/2016  CLINICAL DATA:  Larey SeatFell to the floor yesterday.  Weakness. EXAM: CT HEAD WITHOUT CONTRAST CT CERVICAL SPINE WITHOUT CONTRAST  TECHNIQUE: Multidetector CT imaging of the head and cervical spine was performed following the standard protocol without intravenous contrast. Multiplanar CT image reconstructions of the cervical spine were also generated. COMPARISON:  02/24/2016.  02/23/2016. FINDINGS: CT HEAD FINDINGS The brain shows mild generalized atrophy. There is no evidence of old or acute focal small or large vessel infarction. No mass lesion, hemorrhage, hydrocephalus or extra-axial collection. The calvarium is unremarkable. Sinuses, middle ears and mastoids are clear. CT CERVICAL SPINE FINDINGS No evidence of fracture or traumatic malalignment. There is facet arthropathy on the right at C3-4 and on the left at C2-3. There is ordinary degenerative spondylosis at C4-5 and C5-6. IMPRESSION: Head CT:  Mild age related volume loss.  No focal finding. Cervical spine CT: Mild facet degenerative change and spondylosis. No acute or traumatic finding. Electronically Signed   By: Paulina FusiMark  Shogry M.D.   On: 04/08/2016 21:35   [place] Venous Img Lower Unilateral Right  04/08/2016  CLINICAL DATA:  Right lower extremity erythema after a fall yesterday. Difficulty with ambulation. EXAM: Right LOWER EXTREMITY VENOUS DOPPLER ULTRASOUND TECHNIQUE: Gray-scale sonography with graded compression, as well as color Doppler and duplex ultrasound were performed to evaluate the lower extremity deep venous systems from the level of the common femoral vein and including the common femoral, femoral, profunda femoral, popliteal and calf veins including the posterior tibial, peroneal and gastrocnemius veins when visible. The superficial great saphenous vein was also interrogated. Spectral Doppler was utilized to evaluate flow at rest and with distal augmentation maneuvers in the common femoral, femoral and popliteal veins. COMPARISON:  None. FINDINGS: Contralateral Common Femoral Vein: Respiratory  phasicity is normal and symmetric with the symptomatic side. No evidence of  thrombus. Normal compressibility. Common Femoral Vein: No evidence of thrombus. Normal compressibility, respiratory phasicity and response to augmentation. Saphenofemoral Junction: No evidence of thrombus. Normal compressibility and flow on color Doppler imaging. Profunda Femoral Vein: No evidence of thrombus. Normal compressibility and flow on color Doppler imaging. Femoral Vein: No evidence of thrombus. Normal compressibility, respiratory phasicity and response to augmentation. Popliteal Vein: No evidence of thrombus. Normal compressibility, respiratory phasicity and response to augmentation. Calf Veins: No evidence of thrombus. Normal compressibility and flow on color Doppler imaging. Superficial Great Saphenous Vein: No evidence of thrombus. Normal compressibility and flow on color Doppler imaging. Venous Reflux:  None. Other Findings:  None. IMPRESSION: No evidence of deep venous thrombosis. Electronically Signed   By: Burman Nieves M.D.   On: 04/08/2016 23:11    EKG:  Orders placed or performed during the hospital encounter of 04/08/16  . EKG 12-Lead  . EKG 12-Lead  . ED EKG  . ED EKG    ASSESSMENT AND PLAN:  Principal Problem:   Somnolence Active Problems:   Hypotension   DM2 (diabetes mellitus, type 2) (HCC)   Polypharmacy   Cellulitis  #1 Somnolence, likely due to overmedication, decrease doses of benzodiazepines and opiates, follow clinically, ABGs did not support CO2 retention. Ammonia level is normal. Getting urine drug screen.  #2. Generalized weakness, getting physical therapist involved for further recommendations #3. Adjustment reaction, patient admits of feeling sad due to anticipated move from her home to assisted living facility, getting psychiatrist to see patient in consultation to rule out depression and overmedication due to depression. #4 hypotension, seems to be stable, blood and urine cultures are negative so far, patient is afebrile, unlikely  sepsis   Management plans discussed with the patient, family and they are in agreement.   DRUG ALLERGIES:  Allergies  Allergen Reactions  . Codeine Nausea And Vomiting  . Influenza Vaccines Other (See Comments)    Reaction:  Caused pt to pass out   . Methadone Hives and Itching  . Percocet [Oxycodone-Acetaminophen] Nausea And Vomiting  . Tetanus Toxoids Swelling and Other (See Comments)    Reaction:  Swelling at injection site  . Tetracyclines & Related Rash    CODE STATUS:     Code Status Orders        Start     Ordered   04/09/16 0312  Do not attempt resuscitation (DNR)   Continuous    Question Answer Comment  In the event of cardiac or respiratory ARREST Do not call a "code blue"   In the event of cardiac or respiratory ARREST Do not perform Intubation, CPR, defibrillation or ACLS   In the event of cardiac or respiratory ARREST Use medication by any route, position, wound care, and other measures to relive pain and suffering. May use oxygen, suction and manual treatment of airway obstruction as needed for comfort.      04/09/16 0311    Code Status History    Date Active Date Inactive Code Status Order ID Comments User Context   04/09/2016  2:05 AM 04/09/2016  3:11 AM Full Code [id number]  Oralia Manis, MD Inpatient   04/03/2016  6:53 PM 04/04/2016  6:17 PM Full Code [id number]  Houston Siren, MD Inpatient   02/23/2016 10:52 PM 02/25/2016  4:37 PM DNR [id number]  Robley Fries, MD Inpatient   01/24/2016  3:54 PM 01/27/2016  7:06 PM DNR [id number]  Milagros Loll,  MD Inpatient   01/23/2016  4:07 PM 01/23/2016  4:22 PM Full Code [id number]  Shaune Pollack, MD Inpatient   12/07/2015 10:23 PM 12/09/2015  6:58 PM Full Code [id number]  Auburn Bilberry, MD Inpatient   11/29/2015  5:41 PM 12/01/2015  6:33 PM DNR [id number]  Shaune Pollack, MD Inpatient   11/10/2015  9:54 AM 11/12/2015  9:39 PM DNR [id number]  Linus Galas, MD Inpatient   11/07/2015  5:32 PM 11/10/2015  9:54 AM DNR [id number]  Altamese Dilling, MD Inpatient   11/07/2015  3:49 PM 11/07/2015  5:32 PM Full Code [id number]  Altamese Dilling, MD Inpatient    Advance Directive Documentation        Most Recent Value   Type of Advance Directive  Out of facility DNR (pink MOST or yellow form), Healthcare Power of Attorney, Living will   Pre-existing out of facility DNR order (yellow form or pink MOST form)  Yellow form placed in chart (order not valid for inpatient use)   "MOST" Form in Place?        TOTAL TIME TAKING CARE OF THIS PATIENT: 40 minutes.    Katharina Caper M.D on 04/09/2016 at 12:45 PM  Between 7am to 6pm - Pager - [phone]  After 6pm go to [email or web address] - password [email or web address] [facility]  [place] [facility]  Office  [phone]  CC: Primary care physician; Lauro Regulus., MD

## 2016-04-10 ENCOUNTER — Other Ambulatory Visit: Payer: Self-pay | Admitting: Pharmacist

## 2016-04-10 DIAGNOSIS — L03031 Cellulitis of right toe: Secondary | ICD-10-CM

## 2016-04-10 DIAGNOSIS — I959 Hypotension, unspecified: Secondary | ICD-10-CM | POA: Diagnosis not present

## 2016-04-10 DIAGNOSIS — L97511 Non-pressure chronic ulcer of other part of right foot limited to breakdown of skin: Secondary | ICD-10-CM | POA: Diagnosis not present

## 2016-04-10 DIAGNOSIS — F0390 Unspecified dementia without behavioral disturbance: Secondary | ICD-10-CM | POA: Diagnosis not present

## 2016-04-10 DIAGNOSIS — F4320 Adjustment disorder, unspecified: Secondary | ICD-10-CM | POA: Diagnosis not present

## 2016-04-10 DIAGNOSIS — R531 Weakness: Secondary | ICD-10-CM | POA: Diagnosis not present

## 2016-04-10 DIAGNOSIS — R400 Somnolence: Secondary | ICD-10-CM | POA: Diagnosis not present

## 2016-04-10 DIAGNOSIS — E1140 Type 2 diabetes mellitus with diabetic neuropathy, unspecified: Secondary | ICD-10-CM | POA: Diagnosis not present

## 2016-04-10 MED ORDER — ALPRAZOLAM 0.25 MG PO TABS: 0.2500 mg | ORAL_TABLET | Freq: Three times a day (TID) | ORAL | Status: DC | PRN

## 2016-04-10 MED ORDER — MUPIROCIN 2 % EX OINT
TOPICAL_OINTMENT | Freq: Two times a day (BID) | CUTANEOUS | Status: DC
  Administered 2016-04-10: 12:00:00 via NASAL
  Filled 2016-04-10: qty 22

## 2016-04-10 MED ORDER — TRAZODONE HCL 50 MG PO TABS: 50.0000 mg | ORAL_TABLET | Freq: Every day | ORAL | Status: DC

## 2016-04-10 MED ORDER — MUPIROCIN CALCIUM 2 % EX CREA
TOPICAL_CREAM | Freq: Two times a day (BID) | CUTANEOUS | Status: DC
  Administered 2016-04-10: 14:00:00 via TOPICAL
  Filled 2016-04-10: qty 15

## 2016-04-10 MED ORDER — MUPIROCIN CALCIUM 2 % EX CREA: TOPICAL_CREAM | Freq: Two times a day (BID) | CUTANEOUS | Status: DC

## 2016-04-10 NOTE — Discharge Summary (Signed)
[facility], [facility], [facility] -  at [facility]   PATIENT NAME: Jessica Donovan    MR#:  [id number]  DATE OF BIRTH:  Apr 30, 1946  DATE OF ADMISSION:  04/08/2016 ADMITTING PHYSICIAN: Oralia Manis, MD  DATE OF DISCHARGE: No discharge date for patient encounter.  PRIMARY CARE PHYSICIAN: Lauro Regulus., MD     ADMISSION DIAGNOSIS:  Right leg pain [M79.604] Cellulitis of right lower extremity [L03.115] Hypotension, unspecified hypotension type [I95.9] Altered mental status, unspecified altered mental status type [R41.82]  DISCHARGE DIAGNOSIS:  Principal Problem:   Somnolence Active Problems:   Hypotension   Polypharmacy   Dementia   Cellulitis of fourth toe of right foot   DM2 (diabetes mellitus, type 2) (HCC)   Cellulitis   SECONDARY DIAGNOSIS:   Past Medical History  Diagnosis Date  . Hypertension   . Diabetes mellitus without complication (HCC)   . Coronary artery disease   . Low back pain   . Stroke (HCC)   . Myocardial infarction (HCC)   . Chronic back pain   . Osteomyelitis of toe (HCC) 01/23/2016    .pro HOSPITAL COURSE:  The patient is a 70 year old Caucasian female with past medical history significant for history of essential hypertension, diabetes mellitus, coronary artery disease, low back pain, stroke, and cellulitis of few toes of right foot who presents to the hospital with complaints of lethargy, weakness.  Apparently patient was on high doses of Xanax and morphine, and these medication doses were decreased. With tapering of Xanax and lowering dose of morphine, patient's mental status improved. The patient was seen by psychiatrist who felt that she would benefit from a lowering of the dose of Xanax and decreasing opiates as well. Patient was evaluated by physical therapist and recommended home health physical therapy. Patient was noted to have second and fourth toe cellulitis/erosions of the right foot, she was seen by podiatrist who  recommended mupirocin ointment with wet-to-dry saline dressings to be changed daily. Patient is to see Dr. Alberteen Spindle as outpatient for further recommendations, she was recommended to continue antibiotic therapy per podiatrist's orders in the past.  Discussion by problem #1 Somnolence, due to overmedication, now on decreased doses of benzodiazepines and opiates, improved clinically, ABGs did not support CO2 retention. Ammonia level is normal. Urine drug screen was positive for benzodiazepines, opiates, tricyclics.   #2. Generalized weakness, physical therapist recommended home health services, patient will be discharged home today #3. Adjustment reaction, patient admited of feeling sad due to move from her home to assisted living facility, psychiatrist saw patient in consultation and did not recommend additional medications apart from decreasing doses of Xanax  #4 hypotension, likely overmedication related, resolved, blood and urine cultures were negative so far, patient was afebrile, unlikely sepsis #5. Right foot second and fourth toe ulcerations/infection, patient is to continue antibiotic therapy as previously prescribed by podiatrist, Dr. Alberteen Spindle, she is recommended to initiate mupirocin ointment and wet-to-dry saline dressings on dose daily, patient is to follow-up with primary podiatrist as outpatient within 1 week after discharge   DISCHARGE CONDITIONS:   Stable  CONSULTS OBTAINED:  Treatment Team:  Audery Amel, MD Recardo Evangelist, DPM  DRUG ALLERGIES:   Allergies  Allergen Reactions  . Codeine Nausea And Vomiting  . Influenza Vaccines Other (See Comments)    Reaction:  Caused pt to pass out   . Methadone Hives and Itching  . Percocet [Oxycodone-Acetaminophen] Nausea And Vomiting  . Tetanus Toxoids Swelling and Other (See Comments)    Reaction:  Swelling at injection site  . Tetracyclines & Related Rash    DISCHARGE MEDICATIONS:   Current Discharge Medication List    START  taking these medications   Details  mupirocin cream (BACTROBAN) 2 % Apply topically 2 (two) times daily. Qty: 15 g, Refills: 0      CONTINUE these medications which have CHANGED   Details  ALPRAZolam (XANAX) 0.25 MG tablet Take 1 tablet (0.25 mg total) by mouth 3 (three) times daily as needed for anxiety. Qty: 30 tablet, Refills: 0    traZODone (DESYREL) 50 MG tablet Take 1 tablet (50 mg total) by mouth at bedtime. Qty: 30 tablet, Refills: 6      CONTINUE these medications which have NOT CHANGED   Details  acetaminophen (TYLENOL) 500 MG tablet Take 1,000 mg by mouth every 4 (four) hours as needed for mild pain or headache.     aspirin EC 81 MG tablet Take 81 mg by mouth daily.    atorvastatin (LIPITOR) 20 MG tablet Take 20 mg by mouth daily.    Biotin 5 MG CAPS Take 5 mg by mouth daily at 12 noon.     cyclobenzaprine (FLEXERIL) 10 MG tablet Take 1 tablet (10 mg total) by mouth 3 (three) times daily as needed for muscle spasms. Qty: 20 tablet, Refills: 0    ferrous sulfate 325 (65 FE) MG tablet Take 325 mg by mouth daily with breakfast.    fesoterodine (TOVIAZ) 8 MG TB24 tablet Take 8 mg by mouth daily.    insulin aspart (NOVOLOG) 100 UNIT/ML injection Inject 3-15 Units into the skin 3 (three) times daily with meals as needed for high blood sugar. Pt uses as needed per sliding scale:    Less than 140:  0 units  140-180:  3 units 181-220:  4 units 221- 260:  6 units 261- 320:  8 units 321-360:  10 units 361-400:  12 units Greater than 400:  15 units    Insulin Glargine (TOUJEO SOLOSTAR) 300 UNIT/ML SOPN Inject 50 Units into the skin daily.    isosorbide mononitrate (IMDUR) 30 MG 24 hr tablet Take 30 mg by mouth daily.     lisinopril (PRINIVIL,ZESTRIL) 20 MG tablet Take 20 mg by mouth daily.    loratadine (CLARITIN) 10 MG tablet Take 10 mg by mouth daily.    metoprolol (LOPRESSOR) 50 MG tablet Take 25 mg by mouth 2 (two) times daily.     mirtazapine (REMERON) 15 MG  tablet Take 15 mg by mouth at bedtime.    morphine (MSIR) 15 MG tablet Take 15 mg by mouth every 6 (six) hours as needed for severe pain.    Multiple Vitamin (MULTIVITAMIN WITH MINERALS) TABS tablet Take 1 tablet by mouth daily.    nitroGLYCERIN (NITROSTAT) 0.4 MG SL tablet Place 0.4 mg under the tongue every 5 (five) minutes as needed for chest pain.    omeprazole (PRILOSEC) 20 MG capsule Take 20 mg by mouth daily.    pregabalin (LYRICA) 150 MG capsule Take 150 mg by mouth 2 (two) times daily.    sulfamethoxazole-trimethoprim (BACTRIM DS,SEPTRA DS) 800-160 MG tablet Take 1 tablet by mouth 2 (two) times daily. Qty: 20 tablet, Refills: 1    ticagrelor (BRILINTA) 90 MG TABS tablet Take 90 mg by mouth 2 (two) times daily.    venlafaxine XR (EFFEXOR-XR) 150 MG 24 hr capsule Take 150 mg by mouth daily with breakfast.    vitamin B-12 (CYANOCOBALAMIN) 1000 MCG tablet Take 1,000 mcg by mouth  daily.    Vitamin D, Ergocalciferol, (DRISDOL) 50000 units CAPS capsule Take 50,000 Units by mouth every 7 (seven) days. Pt takes on Sunday.      STOP taking these medications     morphine (MS CONTIN) 30 MG 12 hr tablet          DISCHARGE INSTRUCTIONS:    Patient is to follow-up with primary care physician, podiatrist as outpatient  If you experience worsening of your admission symptoms, develop shortness of breath, life threatening emergency, suicidal or homicidal thoughts you must seek medical attention immediately by calling 911 or calling your MD immediately  if symptoms less severe.  You Must read complete instructions/literature along with all the possible adverse reactions/side effects for all the Medicines you take and that have been prescribed to you. Take any new Medicines after you have completely understood and accept all the possible adverse reactions/side effects.   Please note  You were cared for by a hospitalist during your hospital stay. If you have any questions about your  discharge medications or the care you received while you were in the hospital after you are discharged, you can call the unit and asked to speak with the hospitalist on call if the hospitalist that took care of you is not available. Once you are discharged, your primary care physician will handle any further medical issues. Please note that NO REFILLS for any discharge medications will be authorized once you are discharged, as it is imperative that you return to your primary care physician (or establish a relationship with a primary care physician if you do not have one) for your aftercare needs so that they can reassess your need for medications and monitor your lab values.    Today   CHIEF COMPLAINT:   Chief Complaint  Patient presents with  . Fall  . Weakness  . Gait Problem  . Altered Mental Status  . Hypotension    HISTORY OF PRESENT ILLNESS:  Rusti Arizmendi  is a 70 y.o. female with a known history of essential hypertension, diabetes mellitus, coronary artery disease, low back pain, stroke, and cellulitis of few toes of right foot who presents to the hospital with complaints of lethargy, weakness.  Apparently patient was on high doses of Xanax and morphine, and these medication doses were decreased. With tapering of Xanax and lowering dose of morphine, patient's mental status improved. The patient was seen by psychiatrist who felt that she would benefit from a lowering of the dose of Xanax and decreasing opiates as well. Patient was evaluated by physical therapist and recommended home health physical therapy. Patient was noted to have second and fourth toe cellulitis/erosions of the right foot, she was seen by podiatrist who recommended mupirocin ointment with wet-to-dry saline dressings to be changed daily. Patient is to see Dr. Alberteen Spindle as outpatient for further recommendations, she was recommended to continue antibiotic therapy per podiatrist's orders in the past.  Discussion by problem #1  Somnolence, due to overmedication, now on decreased doses of benzodiazepines and opiates, improved clinically, ABGs did not support CO2 retention. Ammonia level is normal. Urine drug screen was positive for benzodiazepines, opiates, tricyclics.   #2. Generalized weakness, physical therapist recommended home health services, patient will be discharged home today #3. Adjustment reaction, patient admited of feeling sad due to move from her home to assisted living facility, psychiatrist saw patient in consultation and did not recommend additional medications apart from decreasing doses of Xanax  #4 hypotension, likely overmedication related, resolved, blood  and urine cultures were negative so far, patient was afebrile, unlikely sepsis #5. Right foot second and fourth toe ulcerations/infection, patient is to continue antibiotic therapy as previously prescribed by podiatrist, Dr. Alberteen Spindle, she is recommended to initiate mupirocin ointment and wet-to-dry saline dressings on dose daily, patient is to follow-up with primary podiatrist as outpatient within 1 week after discharge    VITAL SIGNS:  Blood pressure 137/67, pulse 90, temperature 97.8 F (36.6 C), temperature source Oral, resp. rate 18, height 5\' 7"  (1.702 m), weight 75.751 kg (167 lb), SpO2 97 %.  I/O:  No intake or output data in the 24 hours ending 04/10/16 1414  PHYSICAL EXAMINATION:  GENERAL:  70 y.o.-year-old patient lying in the bed with no acute distress.  EYES: Pupils equal, round, reactive to light and accommodation. No scleral icterus. Extraocular muscles intact.  HEENT: Head atraumatic, normocephalic. Oropharynx and nasopharynx clear.  NECK:  Supple, no jugular venous distention. No thyroid enlargement, no tenderness.  LUNGS: Normal breath sounds bilaterally, no wheezing, rales,rhonchi or crepitation. No use of accessory muscles of respiration.  CARDIOVASCULAR: S1, S2 normal. No murmurs, rubs, or gallops.  ABDOMEN: Soft, non-tender,  non-distended. Bowel sounds present. No organomegaly or mass.  EXTREMITIES: No pedal edema, cyanosis, or clubbing.  NEUROLOGIC: Cranial nerves II through XII are intact. Muscle strength 5/5 in all extremities. Sensation intact. Gait not checked.  PSYCHIATRIC: The patient is alert and oriented x 3.  SKIN: No obvious rash, lesion, or ulcer. Right foot second and fourth toe dorsal aspect ulcerations and the erosions with some mild purulence was noted. No significant erythema or pain, no drainage  DATA REVIEW:   CBC  Recent Labs Lab 04/09/16 0252  WBC 6.2  HGB 12.1  HCT 36.3  PLT 226    Chemistries   Recent Labs Lab 04/08/16 2025 04/09/16 0252  NA 137 141  K 4.4 4.3  CL 104 109  CO2 25 26  GLUCOSE 84 83  BUN 29* 22*  CREATININE 0.93 0.71  CALCIUM 9.2 9.0  AST 27  --   ALT 10*  --   ALKPHOS 64  --   BILITOT 0.2*  --     Cardiac Enzymes  Recent Labs Lab 04/08/16 2025  TROPONINI <0.03    Microbiology Results  Results for orders placed or performed during the hospital encounter of 04/08/16  Culture, blood (routine x 2)     Status: None (Preliminary result)   Collection Time: 04/08/16  9:15 PM  Result Value Ref Range Status   Specimen Description BLOOD RIGHT ANTECUBITAL  Final   Special Requests BOTTLES DRAWN AEROBIC AND ANAEROBIC  Final   Culture NO GROWTH 2 DAYS  Final   Report Status PENDING  Incomplete  Culture, blood (routine x 2)     Status: None (Preliminary result)   Collection Time: 04/08/16  9:15 PM  Result Value Ref Range Status   Specimen Description BLOOD RIGHT HAND  Final   Special Requests BOTTLES DRAWN AEROBIC AND ANAEROBIC  Final   Culture NO GROWTH 2 DAYS  Final   Report Status PENDING  Incomplete  Urine culture     Status: None (Preliminary result)   Collection Time: 04/08/16 10:35 PM  Result Value Ref Range Status   Specimen Description URINE, RANDOM  Final   Special Requests NONE  Final   Culture CULTURE REINCUBATED FOR BETTER  GROWTH  Final   Report Status PENDING  Incomplete    RADIOLOGY:  Dg Chest 1 View  04/08/2016  CLINICAL DATA:  Fall yesterday. Altered mental status. Weakness and difficulty ambulating. EXAM: CHEST  1 VIEW COMPARISON:  One-view chest x-ray 12/07/2015 FINDINGS: Lung volumes are low. The heart size is normal. There is no edema or effusion to suggest failure. No focal airspace disease is present. IMPRESSION: No acute abnormality or significant interval change. Electronically Signed   By: Marin Roberts M.D.   On: 04/08/2016 21:09   Ct Head Wo Contrast  04/08/2016  CLINICAL DATA:  Larey Seat to the floor yesterday.  Weakness. EXAM: CT HEAD WITHOUT CONTRAST CT CERVICAL SPINE WITHOUT CONTRAST TECHNIQUE: Multidetector CT imaging of the head and cervical spine was performed following the standard protocol without intravenous contrast. Multiplanar CT image reconstructions of the cervical spine were also generated. COMPARISON:  02/24/2016.  02/23/2016. FINDINGS: CT HEAD FINDINGS The brain shows mild generalized atrophy. There is no evidence of old or acute focal small or large vessel infarction. No mass lesion, hemorrhage, hydrocephalus or extra-axial collection. The calvarium is unremarkable. Sinuses, middle ears and mastoids are clear. CT CERVICAL SPINE FINDINGS No evidence of fracture or traumatic malalignment. There is facet arthropathy on the right at C3-4 and on the left at C2-3. There is ordinary degenerative spondylosis at C4-5 and C5-6. IMPRESSION: Head CT:  Mild age related volume loss.  No focal finding. Cervical spine CT: Mild facet degenerative change and spondylosis. No acute or traumatic finding. Electronically Signed   By: Paulina Fusi M.D.   On: 04/08/2016 21:35   Ct Cervical Spine Wo Contrast  04/08/2016  CLINICAL DATA:  Larey Seat to the floor yesterday.  Weakness. EXAM: CT HEAD WITHOUT CONTRAST CT CERVICAL SPINE WITHOUT CONTRAST TECHNIQUE: Multidetector CT imaging of the head and cervical spine was  performed following the standard protocol without intravenous contrast. Multiplanar CT image reconstructions of the cervical spine were also generated. COMPARISON:  02/24/2016.  02/23/2016. FINDINGS: CT HEAD FINDINGS The brain shows mild generalized atrophy. There is no evidence of old or acute focal small or large vessel infarction. No mass lesion, hemorrhage, hydrocephalus or extra-axial collection. The calvarium is unremarkable. Sinuses, middle ears and mastoids are clear. CT CERVICAL SPINE FINDINGS No evidence of fracture or traumatic malalignment. There is facet arthropathy on the right at C3-4 and on the left at C2-3. There is ordinary degenerative spondylosis at C4-5 and C5-6. IMPRESSION: Head CT:  Mild age related volume loss.  No focal finding. Cervical spine CT: Mild facet degenerative change and spondylosis. No acute or traumatic finding. Electronically Signed   By: Paulina Fusi M.D.   On: 04/08/2016 21:35   US Venous Img Lower Unilateral Right  04/08/2016  CLINICAL DATA:  Right lower extremity erythema after a fall yesterday. Difficulty with ambulation. EXAM: Right LOWER EXTREMITY VENOUS DOPPLER ULTRASOUND TECHNIQUE: Gray-scale sonography with graded compression, as well as color Doppler and duplex ultrasound were performed to evaluate the lower extremity deep venous systems from the level of the common femoral vein and including the common femoral, femoral, profunda femoral, popliteal and calf veins including the posterior tibial, peroneal and gastrocnemius veins when visible. The superficial great saphenous vein was also interrogated. Spectral Doppler was utilized to evaluate flow at rest and with distal augmentation maneuvers in the common femoral, femoral and popliteal veins. COMPARISON:  None. FINDINGS: Contralateral Common Femoral Vein: Respiratory phasicity is normal and symmetric with the symptomatic side. No evidence of thrombus. Normal compressibility. Common Femoral Vein: No evidence of  thrombus. Normal compressibility, respiratory phasicity and response to augmentation. Saphenofemoral Junction: No evidence  of thrombus. Normal compressibility and flow on color Doppler imaging. Profunda Femoral Vein: No evidence of thrombus. Normal compressibility and flow on color Doppler imaging. Femoral Vein: No evidence of thrombus. Normal compressibility, respiratory phasicity and response to augmentation. Popliteal Vein: No evidence of thrombus. Normal compressibility, respiratory phasicity and response to augmentation. Calf Veins: No evidence of thrombus. Normal compressibility and flow on color Doppler imaging. Superficial Great Saphenous Vein: No evidence of thrombus. Normal compressibility and flow on color Doppler imaging. Venous Reflux:  None. Other Findings:  None. IMPRESSION: No evidence of deep venous thrombosis. Electronically Signed   By: Burman Nieves M.D.   On: 04/08/2016 23:11    EKG:   Orders placed or performed during the hospital encounter of 04/08/16  . EKG 12-Lead  . EKG 12-Lead  . ED EKG  . ED EKG      Management plans discussed with the patient, family and they are in agreement.  CODE STATUS:     Code Status Orders        Start     Ordered   04/09/16 0312  Do not attempt resuscitation (DNR)   Continuous    Question Answer Comment  In the event of cardiac or respiratory ARREST Do not call a "code blue"   In the event of cardiac or respiratory ARREST Do not perform Intubation, CPR, defibrillation or ACLS   In the event of cardiac or respiratory ARREST Use medication by any route, position, wound care, and other measures to relive pain and suffering. May use oxygen, suction and manual treatment of airway obstruction as needed for comfort.      04/09/16 0311    Code Status History    Date Active Date Inactive Code Status Order ID Comments User Context   04/09/2016  2:05 AM 04/09/2016  3:11 AM Full Code [id number]  Oralia Manis, MD Inpatient   04/03/2016  6:53  PM 04/04/2016  6:17 PM Full Code [id number]  Houston Siren, MD Inpatient   02/23/2016 10:52 PM 02/25/2016  4:37 PM DNR [id number]  Robley Fries, MD Inpatient   01/24/2016  3:54 PM 01/27/2016  7:06 PM DNR [id number]  Milagros Loll, MD Inpatient   01/23/2016  4:07 PM 01/23/2016  4:22 PM Full Code [id number]  Shaune Pollack, MD Inpatient   12/07/2015 10:23 PM 12/09/2015  6:58 PM Full Code [id number]  Auburn Bilberry, MD Inpatient   11/29/2015  5:41 PM 12/01/2015  6:33 PM DNR [id number]  Shaune Pollack, MD Inpatient   11/10/2015  9:54 AM 11/12/2015  9:39 PM DNR [id number]  Linus Galas, MD Inpatient   11/07/2015  5:32 PM 11/10/2015  9:54 AM DNR [id number]  Altamese Dilling, MD Inpatient   11/07/2015  3:49 PM 11/07/2015  5:32 PM Full Code [id number]  Altamese Dilling, MD Inpatient    Advance Directive Documentation        Most Recent Value   Type of Advance Directive  Out of facility DNR (pink MOST or yellow form), Healthcare Power of Attorney, Living will   Pre-existing out of facility DNR order (yellow form or pink MOST form)  Yellow form placed in chart (order not valid for inpatient use)   "MOST" Form in Place?        TOTAL TIME TAKING CARE OF THIS PATIENT: 40 minutes.    Katharina Caper M.D on 04/10/2016 at 2:14 PM  Between 7am to 6pm - Pager - [phone]  After 6pm go to [email or web address] - password [facility]  Office  [phone]  CC: Primary care physician; Lauro Regulus., MD

## 2016-04-10 NOTE — Consult Note (Signed)
   [facility]   04/10/2016  Jessica DanceBarbara Donovan May 28, 1946 [id number]   [facility] is [facility] for chronic disease management services.  Patient has been engaged by a [facility] Community Care Coordinator and CSW.  Our community based plan of care has focused on disease management and community resource support.  Patient will receive a post discharge transition of care call and will be evaluated for monthly home visits for assessments and disease process education. Of note, [facility] services does not replace or interfere with any services that are needed or arranged by inpatient case management or social work.  For additional questions please contact:  Arthurine Oleary RN, BSN [facility]  Hospital Liaison  [phone]) Business Mobile [phone]) Toll free office

## 2016-04-10 NOTE — Consult Note (Signed)
WOC wound consult note Reason for Consult: Ulcerations to second and fourth dorsal toes.  The second metatarsal ulcer is an injury from a dresser.  Patient has been treating these wounds at home with Neosporin daily.   Podiatry has consulted and recommended the following.  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  Periwound:Erythema and edema Dressing procedure/placement/frequency:CLeasne wounds to right second and fourth metatarsals with NS and pat gently dry.  Apply Bactroban ointment to wound bed.  Cover with NS moist 2 x 2 .  Secure with dry dressing and tape.  Change daily. Will not follow at this time.  Please re-consult if needed.  Maple HudsonKaren Christinea Brizuela RN BSN [facility] Pager [phone]

## 2016-04-10 NOTE — Consult Note (Signed)
Patient Demographics  Jessica Donovan, is a 70 y.o. female   MRN: [id number]   DOB - 1946/09/17  Admit Date - 04/08/2016    Outpatient Primary MD for the patient is Lauro Regulus., MD  Consult requested in the Hospital by Katharina Caper, MD, On 04/10/2016    Reason for consult diabetic wounds to the second and fourth toes right foot   With History of -  Past Medical History  Diagnosis Date  . Hypertension   . Diabetes mellitus without complication (HCC)   . Coronary artery disease   . Low back pain   . Stroke (HCC)   . Myocardial infarction (HCC)   . Chronic back pain   . Osteomyelitis of toe (HCC) 01/23/2016      Past Surgical History  Procedure Laterality Date  . Toe amputation    . 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 Cardiac catheterization N/A 11/12/2015    Procedure: Left Heart Cath and Coronary Angiography;  Surgeon: Marcina Millard, MD;  Location: [facility];  Service: Cardiovascular;  Laterality: N/A;  . Amputation toe Left 01/24/2016    Procedure: AMPUTATION TOE (2nd mpj);  Surgeon: Linus Galas, DPM;  Location: [facility] OR[facility];  Service: Podiatry;  Laterality: Left;    in for   Chief Complaint  Patient presents with  . Fall  . Weakness  . Gait Problem  . Altered Mental Status  . Hypotension     HPI  Jessica Donovan  is a 70 y.o. female, patient is a followed by Dr. Linus Galas at our office she's had ulcerations on the fourth toe which are stable at this point had a recent injury to the second toe with an open wound secondary to hitting the toe just 2 days ago. She is admitted to the hospital because of somnolence and lethargy.    Review of Systems  patient is alert  and well-oriented. Systems reviewed from hospitalists. Vital signs and lower extremity systems are detailed further in the note.  Social History Social History  Substance Use Topics  . Smoking status: Never Smoker   . Smokeless tobacco: Not on file  . Alcohol Use: No     Family History Family History  Problem Relation Age of Onset  . CAD Mother   . CAD Father      Prior to Admission medications   Medication Sig Start Date End Date Taking? Authorizing Provider  acetaminophen (TYLENOL) 500 MG tablet Take 1,000 mg by mouth every 4 (four) hours as needed for mild pain or headache.    Yes Historical Provider, MD  ALPRAZolam Prudy Feeler) 1 MG tablet Take 1 tablet (1 mg total) by mouth 3 (three) times daily as needed for anxiety. 01/27/16  Yes Enid Baas, MD  aspirin EC 81 MG tablet Take 81 mg by mouth daily.   Yes Historical Provider, MD  atorvastatin (LIPITOR) 20 MG tablet Take 20 mg by mouth daily.   Yes Historical Provider, MD  Biotin 5 MG CAPS Take 5 mg by mouth daily at 12 noon.    Yes Historical  Provider, MD  cyclobenzaprine (FLEXERIL) 10 MG tablet Take 1 tablet (10 mg total) by mouth 3 (three) times daily as needed for muscle spasms. 01/27/16  Yes Enid Baasadhika Kalisetti, MD  ferrous sulfate 325 (65 FE) MG tablet Take 325 mg by mouth daily with breakfast.   Yes Historical Provider, MD  fesoterodine (TOVIAZ) 8 MG TB24 tablet Take 8 mg by mouth daily.   Yes Historical Provider, MD  insulin aspart (NOVOLOG) 100 UNIT/ML injection Inject 3-15 Units into the skin 3 (three) times daily with meals as needed for high blood sugar. Pt uses as needed per sliding scale:    Less than 140:  0 units  140-180:  3 units 181-220:  4 units 221- 260:  6 units 261- 320:  8 units 321-360:  10 units 361-400:  12 units Greater than 400:  15 units   Yes Historical Provider, MD  Insulin Glargine (TOUJEO SOLOSTAR) 300 UNIT/ML SOPN Inject 50 Units into the skin daily.   Yes Historical Provider, MD  isosorbide  mononitrate (IMDUR) 30 MG 24 hr tablet Take 30 mg by mouth daily.    Yes Historical Provider, MD  lisinopril (PRINIVIL,ZESTRIL) 20 MG tablet Take 20 mg by mouth daily.   Yes Historical Provider, MD  loratadine (CLARITIN) 10 MG tablet Take 10 mg by mouth daily.   Yes Historical Provider, MD  metoprolol (LOPRESSOR) 50 MG tablet Take 25 mg by mouth 2 (two) times daily.    Yes Historical Provider, MD  mirtazapine (REMERON) 15 MG tablet Take 15 mg by mouth at bedtime.   Yes Historical Provider, MD  morphine (MS CONTIN) 30 MG 12 hr tablet Take 30 mg by mouth 3 (three) times daily.   Yes Historical Provider, MD  morphine (MSIR) 15 MG tablet Take 15 mg by mouth every 6 (six) hours as needed for severe pain.   Yes Historical Provider, MD  Multiple Vitamin (MULTIVITAMIN WITH MINERALS) TABS tablet Take 1 tablet by mouth daily.   Yes Historical Provider, MD  nitroGLYCERIN (NITROSTAT) 0.4 MG SL tablet Place 0.4 mg under the tongue every 5 (five) minutes as needed for chest pain.   Yes Historical Provider, MD  omeprazole (PRILOSEC) 20 MG capsule Take 20 mg by mouth daily.   Yes Historical Provider, MD  pregabalin (LYRICA) 150 MG capsule Take 150 mg by mouth 2 (two) times daily.   Yes Historical Provider, MD  sulfamethoxazole-trimethoprim (BACTRIM DS,SEPTRA DS) 800-160 MG tablet Take 1 tablet by mouth 2 (two) times daily. 04/04/16  Yes Linus Galasodd Cline, DPM  ticagrelor (BRILINTA) 90 MG TABS tablet Take 90 mg by mouth 2 (two) times daily.   Yes Historical Provider, MD  traZODone (DESYREL) 100 MG tablet Take 100 mg by mouth at bedtime.    Yes Historical Provider, MD  venlafaxine XR (EFFEXOR-XR) 150 MG 24 hr capsule Take 150 mg by mouth daily with breakfast.   Yes Historical Provider, MD  vitamin B-12 (CYANOCOBALAMIN) 1000 MCG tablet Take 1,000 mcg by mouth daily.   Yes Historical Provider, MD  Vitamin D, Ergocalciferol, (DRISDOL) 50000 units CAPS capsule Take 50,000 Units by mouth every 7 (seven) days. Pt takes on Sunday.    Yes Historical Provider, MD    Anti-infectives    Start     Dose/Rate Route Frequency Ordered Stop   04/09/16 0215  sulfamethoxazole-trimethoprim (BACTRIM DS,SEPTRA DS) 800-160 MG per tablet 1 tablet     1 tablet Oral 2 times daily 04/09/16 0205     04 /19/17 2100  vancomycin (VANCOCIN) 1,500  mg in sodium chloride 0.9 % 500 mL IVPB     1,500 mg 250 mL/hr over 120 Minutes Intravenous  Once 04/08/16 2055 04/09/16 0048      Scheduled Meds: . aspirin EC  81 mg Oral Daily  . atorvastatin  20 mg Oral Daily  . enoxaparin (LOVENOX) injection  40 mg Subcutaneous Q24H  . insulin aspart  0-5 Units Subcutaneous QHS  . insulin aspart  0-9 Units Subcutaneous TID WC  . isosorbide mononitrate  30 mg Oral Daily  . lisinopril  20 mg Oral Daily  . mirtazapine  15 mg Oral QHS  . mupirocin ointment   Nasal BID  . pantoprazole  40 mg Oral Daily  . pregabalin  150 mg Oral BID  . sodium chloride flush  3 mL Intravenous Q12H  . sulfamethoxazole-trimethoprim  1 tablet Oral BID  . ticagrelor  90 mg Oral BID  . traZODone  50 mg Oral QHS  . venlafaxine XR  150 mg Oral Q breakfast   Continuous Infusions:  PRN Meds:.ALPRAZolam, morphine, oxyCODONE  Allergies  Allergen Reactions  . Codeine Nausea And Vomiting  . Influenza Vaccines Other (See Comments)    Reaction:  Caused pt to pass out   . Methadone Hives and Itching  . Percocet [Oxycodone-Acetaminophen] Nausea And Vomiting  . Tetanus Toxoids Swelling and Other (See Comments)    Reaction:  Swelling at injection site  . Tetracyclines & Related Rash    Physical Exam  Vitals  Blood pressure 126/52, pulse 80, temperature 97.3 F (36.3 C), temperature source Oral, resp. rate 18, height  (1.702 m), weight 75.751 kg (167 lb), SpO2 100 %.  Lower Extremity exam:  Vascular: +2 over 4 bilateral for DP pulses and +1 over 4 for PT pulses bilaterally. Patient has good color and capillary refill times to remaining digits.  Dermatological: Patient has  an ulceration approximately 4 mm diameter on the dorsal aspect of the left second toe appears to be very superficial with no drainage or spreading cellulitis to the region. Patient has a fourth toe ulceration which is resolving. To have dry skin but no evidence of open wound or infection.  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 Dr. Linus Galas has done amputation second third toes left foot. Second toes third toes and fourth toes on the right foot are starting to become more contracted and hammered in nature.  Data Review  CBC  Recent Labs Lab 04/03/16 1324 04/04/16 0415 04/08/16 2025 04/09/16 0252  WBC 7.3 4.8 8.0 6.2  HGB 12.3 11.4* 11.0* 12.1  HCT 37.3 33.6* 32.9* 36.3  PLT 256 211 249 226  MCV 93.1 93.0 93.0 93.9  MCH 30.7 31.4 31.1 31.2  MCHC 33.0 33.8 33.4 33.2  RDW 14.7* 14.9* 14.6* 14.8*  LYMPHSABS 2.2  --  3.1  --   MONOABS 0.7  --  0.8  --   EOSABS 0.2  --  0.2  --   BASOSABS 0.1  --  0.1  --    ------------------------------------------------------------------------------------------------------------------  Chemistries   Recent Labs Lab 04/03/16 1324 04/04/16 0415 04/08/16 2025 04/09/16 0252  NA 136 136 137 141  K 4.2 3.8 4.4 4.3  CL 103 101 104 109  CO2 GLUCOSE 140* 122* 84 83  BUN 20 17 29* 22*  CREATININE 0.65 0.52 0.93 0.71  CALCIUM 9.1 8.7* 9.2 9.0  AST  21  --  27  --   ALT 10*  --  10*  --   ALKPHOS 85  --  64  --   BILITOT 0.4  --  0.2*  --    ------------------------------------------------------------------------------------------------------------------ estimated creatinine clearance is 70.5 mL/min (by C-G formula based on Cr of 0.71). ------------------------------------------------------------------------------------------------------------------ No results for input(s): TSH, T4TOTAL, T3FREE, THYROIDAB in the last 72  hours.  Invalid input(s): FREET3   Coagulation profile  Recent Labs Lab 04/08/16 2025  INR 1.21   ------------------------------------------------------------------------------------------------------------------- No results for input(s): DDIMER in the last 72 hours. -------------------------------------------------------------------------------------------------------------------  Cardiac Enzymes  Recent Labs Lab 04/08/16 2025  TROPONINI <0.03   ------------------------------------------------------------------------------------------------------------------ Invalid input(s): POCBNP   ---------------------------------------------------------------------------------------------------------------  Urinalysis    Component Value Date/Time   COLORURINE STRAW* 04/08/2016 2235   COLORURINE Yellow 12/15/2014 0958   APPEARANCEUR HAZY* 04/08/2016 2235   APPEARANCEUR Hazy 12/15/2014 0958   LABSPEC 1.008 04/08/2016 2235   LABSPEC 1.014 12/15/2014 0958   PHURINE 5.0 04/08/2016 2235   PHURINE 5.0 12/15/2014 0958   GLUCOSEU NEGATIVE 04/08/2016 2235   GLUCOSEU Negative 12/15/2014 0958   HGBUR NEGATIVE 04/08/2016 2235   HGBUR Negative 12/15/2014 0958   BILIRUBINUR NEGATIVE 04/08/2016 2235   BILIRUBINUR Negative 12/15/2014 0958   KETONESUR NEGATIVE 04/08/2016 2235   KETONESUR Negative 12/15/2014 0958   PROTEINUR NEGATIVE 04/08/2016 2235   PROTEINUR Negative 12/15/2014 0958   NITRITE NEGATIVE 04/08/2016 2235   NITRITE Negative 12/15/2014 0958   LEUKOCYTESUR 1+* 04/08/2016 2235   LEUKOCYTESUR 1+ 12/15/2014 0958     Imaging results:   Dg Chest 1 View  04/08/2016  CLINICAL DATA:  Fall yesterday. Altered mental status. Weakness and difficulty ambulating. EXAM: CHEST  1 VIEW COMPARISON:  One-view chest x-ray 12/07/2015 FINDINGS: Lung volumes are low. The heart size is normal. There is no edema or effusion to suggest failure. No focal airspace disease is present. IMPRESSION: No  acute abnormality or significant interval change. Electronically Signed   By: Marin Roberts M.D.   On: 04/08/2016 21:09   Ct Head Wo Contrast  04/08/2016  CLINICAL DATA:  Larey Seat to the floor yesterday.  Weakness. EXAM: CT HEAD WITHOUT CONTRAST CT CERVICAL SPINE WITHOUT CONTRAST TECHNIQUE: Multidetector CT imaging of the head and cervical spine was performed following the standard protocol without intravenous contrast. Multiplanar CT image reconstructions of the cervical spine were also generated. COMPARISON:  02/24/2016.  02/23/2016. FINDINGS: CT HEAD FINDINGS The brain shows mild generalized atrophy. There is no evidence of old or acute focal small or large vessel infarction. No mass lesion, hemorrhage, hydrocephalus or extra-axial collection. The calvarium is unremarkable. Sinuses, middle ears and mastoids are clear. CT CERVICAL SPINE FINDINGS No evidence of fracture or traumatic malalignment. There is facet arthropathy on the right at C3-4 and on the left at C2-3. There is ordinary degenerative spondylosis at C4-5 and C5-6. IMPRESSION: Head CT:  Mild age related volume loss.  No focal finding. Cervical spine CT: Mild facet degenerative change and spondylosis. No acute or traumatic finding. Electronically Signed   By: Paulina Fusi M.D.   On: 04/08/2016 21:35   Ct Cervical Spine Wo Contrast  04/08/2016  CLINICAL DATA:  Larey Seat to the floor yesterday.  Weakness. EXAM: CT HEAD WITHOUT CONTRAST CT CERVICAL SPINE WITHOUT CONTRAST TECHNIQUE: Multidetector CT imaging of the head and cervical spine was performed following the standard protocol without intravenous contrast. Multiplanar CT image reconstructions of the cervical spine were also generated. COMPARISON:  02/24/2016.  02/23/2016. FINDINGS: CT HEAD FINDINGS  The brain shows mild generalized atrophy. There is no evidence of old or acute focal small or large vessel infarction. No mass lesion, hemorrhage, hydrocephalus or extra-axial collection. The calvarium  is unremarkable. Sinuses, middle ears and mastoids are clear. CT CERVICAL SPINE FINDINGS No evidence of fracture or traumatic malalignment. There is facet arthropathy on the right at C3-4 and on the left at C2-3. There is ordinary degenerative spondylosis at C4-5 and C5-6. IMPRESSION: Head CT:  Mild age related volume loss.  No focal finding. Cervical spine CT: Mild facet degenerative change and spondylosis. No acute or traumatic finding. Electronically Signed   By: Paulina Fusi M.D.   On: 04/08/2016 21:35   US Venous Img Lower Unilateral Right  04/08/2016  CLINICAL DATA:  Right lower extremity erythema after a fall yesterday. Difficulty with ambulation. EXAM: Right LOWER EXTREMITY VENOUS DOPPLER ULTRASOUND TECHNIQUE: Gray-scale sonography with graded compression, as well as color Doppler and duplex ultrasound were performed to evaluate the lower extremity deep venous systems from the level of the common femoral vein and including the common femoral, femoral, profunda femoral, popliteal and calf veins including the posterior tibial, peroneal and gastrocnemius veins when visible. The superficial great saphenous vein was also interrogated. Spectral Doppler was utilized to evaluate flow at rest and with distal augmentation maneuvers in the common femoral, femoral and popliteal veins. COMPARISON:  None. FINDINGS: Contralateral Common Femoral Vein: Respiratory phasicity is normal and symmetric with the symptomatic side. No evidence of thrombus. Normal compressibility. Common Femoral Vein: No evidence of thrombus. Normal compressibility, respiratory phasicity and response to augmentation. Saphenofemoral Junction: No evidence of thrombus. Normal compressibility and flow on color Doppler imaging. Profunda Femoral Vein: No evidence of thrombus. Normal compressibility and flow on color Doppler imaging. Femoral Vein: No evidence of thrombus. Normal compressibility, respiratory phasicity and response to augmentation.  Popliteal Vein: No evidence of thrombus. Normal compressibility, respiratory phasicity and response to augmentation. Calf Veins: No evidence of thrombus. Normal compressibility and flow on color Doppler imaging. Superficial Great Saphenous Vein: No evidence of thrombus. Normal compressibility and flow on color Doppler imaging. Venous Reflux:  None. Other Findings:  None. IMPRESSION: No evidence of deep venous thrombosis. Electronically Signed   By: Burman Nieves M.D.   On: 04/08/2016 23:11    Assessment & Plan: Start mupirocin ointment and wet-to-dry saline dressing. Also dispensed postop shoe. She is stable to this point does not need any further intervention just daily dressing changes a better protection. Follow up with Dr. Linus Galas next week after no clinic.  Principal Problem:   Somnolence Active Problems:   Hypotension   DM2 (diabetes mellitus, type 2) (HCC)   Polypharmacy   Cellulitis   Dementia     Family Communication: Plan discussed with patient . Epimenio Sarin M.D on 04/10/2016 at 11:32 AM  Thank you for the consult, we will follow the patient with you in the Hospital.

## 2016-04-10 NOTE — Consult Note (Signed)
[facility] Face-to-Face Psychiatry Consult   Reason for Consult:  Consult for 70 year old woman with a history of anxiety and depression. Consultation because of possible overmedication. Referring Physician:  Judeen Hammans Patient Identification: Karole Oo MRN:  [id number] Principal Diagnosis: Somnolence Diagnosis:   Patient Active Problem List   Diagnosis Date Noted  . Cellulitis of fourth toe of right foot [L03.031] 04/10/2016  . Somnolence [R40.0] 04/09/2016  . Dementia [F03.90] 04/09/2016  . Cellulitis [L03.90] 04/03/2016  . Hematoma of right parietal scalp [S00.03XA] 02/23/2016  . Multiple falls [R29.6] 02/23/2016  . Physical debility [R53.81] 02/23/2016  . DM2 (diabetes mellitus, type 2) ([place]) [E11.9] 02/23/2016  . Syncope and collapse [R55] 02/23/2016  . Polypharmacy [Z79.899] 02/23/2016  . Osteomyelitis of toe ([place]) [M86.9] 01/23/2016  . Rhabdomyolysis [M62.82] 12/07/2015  . ARF (acute renal failure) ([place]) [N17.9] 11/29/2015  . Hypotension [I95.9] 11/29/2015  . Diabetic osteomyelitis ([place]) [E11.69, M86.9] 11/07/2015  . Angina effort ([place]) [I20.8] 11/07/2015  . Osteomyelitis ([place]) [M86.9] 11/07/2015    Total Time spent with patient: 20 minutes  Subjective:   Maddox Bratcher is a 70 y.o. female patient admitted with "I'm better than when I came in".  Follow-up Friday the 21st. Patient is being discharged today. I did get to speak with her for a while before she left. Patient indicates that she is feeling irritated and upset because her medications have been decreased. She is primarily focused on her pain medicine and says that she's upset because she doesn't think people understand her level of pain. She also complained to me about her Xanax being decreased although she didn't identify a specific problem with the lower dose of Xanax. She acknowledged that her trazodone had been kept about the same. She said she slept adequately last night. Affect was somewhat irritable. She was lucid. Her  insight into how the sedatives that cause problems for her appeared to be somewhat limited by her focus on her pain.  HPI:  Patient interviewed. Chart reviewed. Older Notes reviewed. This 70 year old woman was brought into the hospital because of a spell of altered mental status. She tells me that she had what she thinks is "a mini stroke". She describes feeling uncoordinated like she couldn't stand up feeling confused having memory problems. She remains convinced that this was related to some kind of stroke. She denies that she had taken excessive or different amounts of medication than usual. Patient reports that she is feeling better now pretty much back to her baseline. She reports that her mood has been fairly stable. A little bit low but not severely depressed. She says that she normally sleeps fairly well despite having sleep apnea. Appetite is been adequate. She denies having any suicidal thoughts. She feels like she has resigned herself to the possibility of death with her multiple medical problems but is wanting to get the best treatment she can and has no wish to die. Denies having any hallucinations or psychotic symptoms. Patient insists that she manages her medications well. She says that she has assistance from home health and laying out her medicines for her now. Patient is prescribed Xanax 1 mg 4 times a day although she says she only takes it 3 times a day. She also has several narcotic prescriptions that she takes chronically.  Social history: Patient is a widow. She tells me that she's been living recently at [facility]. She says that she does not live fully independently anymore.  Medical history: Diabetes and has had to have several toe amputations  as a result of poor wound healing. History of cellulitis.  Substance abuse history: Denies any history whatsoever of alcohol or drug abuse and denies any intentional miss use of any of her prescription medicine.  Past Psychiatric  History: Patient had psychiatric treatment after her husband died in 2009/03/14. She has been prescribed medication for depression and for anxiety. No history of suicide attempts. No history of inpatient psychiatric treatment. She appears to be on venlafaxine and Xanax chronically.  Risk to Self: Is patient at risk for suicide?: No Risk to Others:   Prior Inpatient Therapy:   Prior Outpatient Therapy:    Past Medical History:  Past Medical History  Diagnosis Date  . Hypertension   . Diabetes mellitus without complication ([place])   . Coronary artery disease   . Low back pain   . Stroke ([place])   . Myocardial infarction ([place])   . Chronic back pain   . Osteomyelitis of toe ([place]) 01/23/2016    Past Surgical History  Procedure Laterality Date  . Toe amputation    . Amputation toe Left 11/10/2015    Procedure: AMPUTATION TOE;  Surgeon: Sharlotte Alamo, MD;  Location: [facility] ORS;  Service: Podiatry;  Laterality: Left;  . Cardiac catheterization N/A 11/12/2015    Procedure: Left Heart Cath and Coronary Angiography;  Surgeon: Isaias Cowman, MD;  Location: [facility];  Service: Cardiovascular;  Laterality: N/A;  . Amputation toe Left 01/24/2016    Procedure: AMPUTATION TOE (2nd mpj);  Surgeon: Sharlotte Alamo, DPM;  Location: [facility] ORS;  Service: Podiatry;  Laterality: Left;   Family History:  Family History  Problem Relation Age of Onset  . CAD Mother   . CAD Father    Family Psychiatric  History: Patient denies there being any family history of mental health problems Social History:  History  Alcohol Use No     History  Drug Use No    Social History   Social History  . Marital Status: Widowed    Spouse Name: N/A  . Number of Children: N/A  . Years of Education: N/A   Social History Main Topics  . Smoking status: Never Smoker   . Smokeless tobacco: None  . Alcohol Use: No  . Drug Use: No  . Sexual Activity: Not Currently   Other Topics Concern  . None   Social History  Narrative   Additional Social History:    Allergies:   Allergies  Allergen Reactions  . Codeine Nausea And Vomiting  . Influenza Vaccines Other (See Comments)    Reaction:  Caused pt to pass out   . Methadone Hives and Itching  . Percocet [Oxycodone-Acetaminophen] Nausea And Vomiting  . Tetanus Toxoids Swelling and Other (See Comments)    Reaction:  Swelling at injection site  . Tetracyclines & Related Rash    Labs:  Results for orders placed or performed during the hospital encounter of 04/08/16 (from the past 48 hour(s))  Protime-INR     Status: Abnormal   Collection Time: 04/08/16  8:25 PM  Result Value Ref Range   Prothrombin Time 15.5 (H) 11.4 - 15.0 seconds   INR 1.21   APTT     Status: None   Collection Time: 04/08/16  8:25 PM  Result Value Ref Range   aPTT 32 24 - 36 seconds  CBC     Status: Abnormal   Collection Time: 04/08/16  8:25 PM  Result Value Ref Range   WBC 8.0 3.6 - 11.0 K/uL  RBC 3.54 (L) 3.80 - 5.20 MIL/uL   Hemoglobin 11.0 (L) 12.0 - 16.0 g/dL   HCT 32.9 (L) 35.0 - 47.0 %   MCV 93.0 80.0 - 100.0 fL   MCH 31.1 26.0 - 34.0 pg   MCHC 33.4 32.0 - 36.0 g/dL   RDW 14.6 (H) 11.5 - 14.5 %   Platelets 249 150 - 440 K/uL  Differential     Status: None   Collection Time: 04/08/16  8:25 PM  Result Value Ref Range   Neutrophils Relative % 48 %   Neutro Abs 3.9 1.4 - 6.5 K/uL   Lymphocytes Relative 39 %   Lymphs Abs 3.1 1.0 - 3.6 K/uL   Monocytes Relative 10 %   Monocytes Absolute 0.8 0.2 - 0.9 K/uL   Eosinophils Relative 2 %   Eosinophils Absolute 0.2 0 - 0.7 K/uL   Basophils Relative 1 %   Basophils Absolute 0.1 0 - 0.1 K/uL  Comprehensive metabolic panel     Status: Abnormal   Collection Time: 04/08/16  8:25 PM  Result Value Ref Range   Sodium 137 135 - 145 mmol/L   Potassium 4.4 3.5 - 5.1 mmol/L   Chloride 104 101 - 111 mmol/L   CO2 25 22 - 32 mmol/L   Glucose, Bld 84 65 - 99 mg/dL   BUN 29 (H) 6 - 20 mg/dL   Creatinine, Ser 0.93 0.44 - 1.00  mg/dL   Calcium 9.2 8.9 - 10.3 mg/dL   Total Protein 6.3 (L) 6.5 - 8.1 g/dL   Albumin 3.9 3.5 - 5.0 g/dL   AST 27 15 - 41 U/L   ALT 10 (L) 14 - 54 U/L   Alkaline Phosphatase 64 38 - 126 U/L   Total Bilirubin 0.2 (L) 0.3 - 1.2 mg/dL   GFR calc non Af Amer >60 >60 mL/min   GFR calc Af Amer >60 >60 mL/min    Comment: (NOTE) The eGFR has been calculated using the CKD EPI equation. This calculation has not been validated in all clinical situations. eGFR's persistently <60 mL/min signify possible Chronic Kidney Disease.    Anion gap 8 5 - 15  Troponin I     Status: None   Collection Time: 04/08/16  8:25 PM  Result Value Ref Range   Troponin I <0.03 <0.031 ng/mL    Comment:        NO INDICATION OF MYOCARDIAL INJURY.   Culture, blood (routine x 2)     Status: None (Preliminary result)   Collection Time: 04/08/16  9:15 PM  Result Value Ref Range   Specimen Description BLOOD RIGHT ANTECUBITAL    Special Requests BOTTLES DRAWN AEROBIC AND ANAEROBIC 10ML    Culture NO GROWTH 2 DAYS    Report Status PENDING   Culture, blood (routine x 2)     Status: None (Preliminary result)   Collection Time: 04/08/16  9:15 PM  Result Value Ref Range   Specimen Description BLOOD RIGHT HAND    Special Requests BOTTLES DRAWN AEROBIC AND ANAEROBIC 10ML    Culture NO GROWTH 2 DAYS    Report Status PENDING   Lactic acid, plasma     Status: None   Collection Time: 04/08/16  9:18 PM  Result Value Ref Range   Lactic Acid, Venous 0.8 0.5 - 2.0 mmol/L  Urine culture     Status: None (Preliminary result)   Collection Time: 04/08/16 10:35 PM  Result Value Ref Range   Specimen Description URINE, RANDOM  Special Requests NONE    Culture CULTURE REINCUBATED FOR BETTER GROWTH    Report Status PENDING   Urinalysis complete, with microscopic     Status: Abnormal   Collection Time: 04/08/16 10:35 PM  Result Value Ref Range   Color, Urine STRAW (A) YELLOW   APPearance HAZY (A) CLEAR   Glucose, UA NEGATIVE  NEGATIVE mg/dL   Bilirubin Urine NEGATIVE NEGATIVE   Ketones, ur NEGATIVE NEGATIVE mg/dL   Specific Gravity, Urine 1.008 1.005 - 1.030   Hgb urine dipstick NEGATIVE NEGATIVE   pH 5.0 5.0 - 8.0   Protein, ur NEGATIVE NEGATIVE mg/dL   Nitrite NEGATIVE NEGATIVE   Leukocytes, UA 1+ (A) NEGATIVE   RBC / HPF 0-5 0 - 5 RBC/hpf   WBC, UA 6-30 0 - 5 WBC/hpf   Bacteria, UA NONE SEEN NONE SEEN   Squamous Epithelial / LPF 0-5 (A) NONE SEEN  Glucose, capillary     Status: Abnormal   Collection Time: 04/09/16  2:19 AM  Result Value Ref Range   Glucose-Capillary 64 (L) 65 - 99 mg/dL  Glucose, capillary     Status: None   Collection Time: 04/09/16  2:28 AM  Result Value Ref Range   Glucose-Capillary 67 65 - 99 mg/dL  Glucose, capillary     Status: Abnormal   Collection Time: 04/09/16  2:43 AM  Result Value Ref Range   Glucose-Capillary 51 (L) 65 - 99 mg/dL  Glucose, capillary     Status: None   Collection Time: 04/09/16  2:46 AM  Result Value Ref Range   Glucose-Capillary 74 65 - 99 mg/dL  Basic metabolic panel     Status: Abnormal   Collection Time: 04/09/16  2:52 AM  Result Value Ref Range   Sodium 141 135 - 145 mmol/L   Potassium 4.3 3.5 - 5.1 mmol/L   Chloride 109 101 - 111 mmol/L   CO2 26 22 - 32 mmol/L   Glucose, Bld 83 65 - 99 mg/dL   BUN 22 (H) 6 - 20 mg/dL   Creatinine, Ser 0.71 0.44 - 1.00 mg/dL   Calcium 9.0 8.9 - 10.3 mg/dL   GFR calc non Af Amer >60 >60 mL/min   GFR calc Af Amer >60 >60 mL/min    Comment: (NOTE) The eGFR has been calculated using the CKD EPI equation. This calculation has not been validated in all clinical situations. eGFR's persistently <60 mL/min signify possible Chronic Kidney Disease.    Anion gap 6 5 - 15  CBC     Status: Abnormal   Collection Time: 04/09/16  2:52 AM  Result Value Ref Range   WBC 6.2 3.6 - 11.0 K/uL   RBC 3.87 3.80 - 5.20 MIL/uL   Hemoglobin 12.1 12.0 - 16.0 g/dL   HCT 36.3 35.0 - 47.0 %   MCV 93.9 80.0 - 100.0 fL   MCH 31.2  26.0 - 34.0 pg   MCHC 33.2 32.0 - 36.0 g/dL   RDW 14.8 (H) 11.5 - 14.5 %   Platelets 226 150 - 440 K/uL  Glucose, capillary     Status: None   Collection Time: 04/09/16  3:06 AM  Result Value Ref Range   Glucose-Capillary 83 65 - 99 mg/dL  Glucose, capillary     Status: Abnormal   Collection Time: 04/09/16  7:40 AM  Result Value Ref Range   Glucose-Capillary 125 (H) 65 - 99 mg/dL  Ammonia     Status: None   Collection Time: 04/09/16  7:51  AM  Result Value Ref Range   Ammonia 34 9 - 35 umol/L  Blood gas, arterial     Status: Abnormal   Collection Time: 04/09/16  9:50 AM  Result Value Ref Range   FIO2 0.21    pH, Arterial 7.37 7.350 - 7.450   pCO2 arterial 41 32.0 - 48.0 mmHg   pO2, Arterial 65 (L) 83.0 - 108.0 mmHg   Bicarbonate 23.7 21.0 - 28.0 mEq/L   Acid-base deficit 1.5 0.0 - 2.0 mmol/L   O2 Saturation 91.7 %   Patient temperature 37.0    Collection site LEFT RADIAL    Sample type ARTERIAL DRAW    Allens test (pass/fail) POSITIVE (A) PASS  Glucose, capillary     Status: Abnormal   Collection Time: 04/09/16 11:32 AM  Result Value Ref Range   Glucose-Capillary 160 (H) 65 - 99 mg/dL  Glucose, capillary     Status: Abnormal   Collection Time: 04/09/16  4:43 PM  Result Value Ref Range   Glucose-Capillary 115 (H) 65 - 99 mg/dL  Urine Drug Screen, Qualitative ([facility] only)     Status: Abnormal   Collection Time: 04/09/16  6:44 PM  Result Value Ref Range   Tricyclic, Ur Screen POSITIVE (A) NONE DETECTED   Amphetamines, Ur Screen NONE DETECTED NONE DETECTED   MDMA (Ecstasy)Ur Screen NONE DETECTED NONE DETECTED   Cocaine Metabolite,Ur [place] NONE DETECTED NONE DETECTED   Opiate, Ur Screen POSITIVE (A) NONE DETECTED   Phencyclidine (PCP) Ur S NONE DETECTED NONE DETECTED   Cannabinoid 50 Ng, Ur [place] NONE DETECTED NONE DETECTED   Barbiturates, Ur Screen NONE DETECTED NONE DETECTED   Benzodiazepine, Ur Scrn POSITIVE (A) NONE DETECTED   Methadone Scn, Ur NONE DETECTED NONE DETECTED     Comment: (NOTE) 539  Tricyclics, urine               Cutoff 1000 ng/mL 200  Amphetamines, urine             Cutoff 1000 ng/mL 300  MDMA (Ecstasy), urine           Cutoff 500 ng/mL 400  Cocaine Metabolite, urine       Cutoff 300 ng/mL 500  Opiate, urine                   Cutoff 300 ng/mL 600  Phencyclidine (PCP), urine      Cutoff 25 ng/mL 700  Cannabinoid, urine              Cutoff 50 ng/mL 800  Barbiturates, urine             Cutoff 200 ng/mL 900  Benzodiazepine, urine           Cutoff 200 ng/mL 1000 Methadone, urine                Cutoff 300 ng/mL 1100 1200 The urine drug screen provides only a preliminary, unconfirmed 1300 analytical test result and should not be used for non-medical 1400 purposes. Clinical consideration and professional judgment should 1500 be applied to any positive drug screen result due to possible 1600 interfering substances. A more specific alternate chemical method 1700 must be used in order to obtain a confirmed analytical result.  1800 Gas chromato graphy / mass spectrometry (GC/MS) is the preferred 1900 confirmatory method.   Glucose, capillary     Status: Abnormal   Collection Time: 04/09/16  9:41 PM  Result Value Ref Range   Glucose-Capillary 130 (H) 65 -  99 mg/dL    Current Facility-Administered Medications  Medication Dose Route Frequency Provider Last Rate Last Dose  . ALPRAZolam Duanne Moron) tablet 0.25 mg  0.25 mg Oral TID PRN Theodoro Grist, MD   0.25 mg at 04/09/16 2059  . aspirin EC tablet 81 mg  81 mg Oral Daily Lance Coon, MD   81 mg at 04/10/16 0919  . atorvastatin (LIPITOR) tablet 20 mg  20 mg Oral Daily Lance Coon, MD   20 mg at 04/10/16 0919  . enoxaparin (LOVENOX) injection 40 mg  40 mg Subcutaneous Q24H Lance Coon, MD   40 mg at 04/09/16 2024  . insulin aspart (novoLOG) injection 0-5 Units  0-5 Units Subcutaneous QHS Lance Coon, MD   0 Units at 04/09/16 2158  . insulin aspart (novoLOG) injection 0-9 Units  0-9 Units Subcutaneous TID  WC Lance Coon, MD   1 Units at 04/10/16 [id number]  . isosorbide mononitrate (IMDUR) 24 hr tablet 30 mg  30 mg Oral Daily Lance Coon, MD   30 mg at 04/10/16 0920  . lisinopril (PRINIVIL,ZESTRIL) tablet 20 mg  20 mg Oral Daily Lance Coon, MD   20 mg at 04/10/16 0920  . mirtazapine (REMERON) tablet 15 mg  15 mg Oral QHS Lance Coon, MD   15 mg at 04/09/16 2213  . morphine (MSIR) tablet 15 mg  15 mg Oral Q8H PRN Theodoro Grist, MD   15 mg at 04/10/16 0931  . mupirocin cream (BACTROBAN) 2 %   Topical BID Theodoro Grist, MD      . mupirocin ointment (BACTROBAN) 2 %   Nasal BID Albertine Patricia, DPM      . oxyCODONE (Oxy IR/ROXICODONE) immediate release tablet 10 mg  10 mg Oral Q6H PRN Theodoro Grist, MD   10 mg at 04/10/16 1418  . pantoprazole (PROTONIX) EC tablet 40 mg  40 mg Oral Daily Lance Coon, MD   40 mg at 04/10/16 0920  . pregabalin (LYRICA) capsule 150 mg  150 mg Oral BID Lance Coon, MD   150 mg at 04/10/16 0919  . sodium chloride flush (NS) 0.9 % injection 3 mL  3 mL Intravenous Q12H Lance Coon, MD   3 mL at 04/10/16 0932  . sulfamethoxazole-trimethoprim (BACTRIM DS,SEPTRA DS) 800-160 MG per tablet 1 tablet  1 tablet Oral BID Lenis Noon, [facility]   1 tablet at 04/10/16 0920  . ticagrelor (BRILINTA) tablet 90 mg  90 mg Oral BID Lance Coon, MD   90 mg at 04/10/16 0920  . traZODone (DESYREL) tablet 50 mg  50 mg Oral QHS Lance Coon, MD   50 mg at 04/09/16 2212  . venlafaxine XR (EFFEXOR-XR) 24 hr capsule 150 mg  150 mg Oral Q breakfast Lance Coon, MD   150 mg at 04/10/16 9242   Current Outpatient Prescriptions  Medication Sig Dispense Refill  . acetaminophen (TYLENOL) 500 MG tablet Take 1,000 mg by mouth every 4 (four) hours as needed for mild pain or headache.     [name] aspirin EC 81 MG tablet Take 81 mg by mouth daily.    [name] atorvastatin (LIPITOR) 20 MG tablet Take 20 mg by mouth daily.    . Biotin 5 MG CAPS Take 5 mg by mouth daily at 12 noon.     . cyclobenzaprine (FLEXERIL) 10 MG tablet  Take 1 tablet (10 mg total) by mouth 3 (three) times daily as needed for muscle spasms. 20 tablet 0  . ferrous sulfate 325 (65 FE) MG tablet Take  325 mg by mouth daily with breakfast.    . fesoterodine (TOVIAZ) 8 MG TB24 tablet Take 8 mg by mouth daily.    . insulin aspart (NOVOLOG) 100 UNIT/ML injection Inject 3-15 Units into the skin 3 (three) times daily with meals as needed for high blood sugar. Pt uses as needed per sliding scale:    Less than 140:  0 units  140-180:  3 units 181-220:  4 units 221- 260:  6 units 261- 320:  8 units 321-360:  10 units 361-400:  12 units Greater than 400:  15 units    . Insulin Glargine (TOUJEO SOLOSTAR) 300 UNIT/ML SOPN Inject 50 Units into the skin daily.    . isosorbide mononitrate (IMDUR) 30 MG 24 hr tablet Take 30 mg by mouth daily.     [name] lisinopril (PRINIVIL,ZESTRIL) 20 MG tablet Take 20 mg by mouth daily.    [name] loratadine (CLARITIN) 10 MG tablet Take 10 mg by mouth daily.    . metoprolol (LOPRESSOR) 50 MG tablet Take 25 mg by mouth 2 (two) times daily.     . mirtazapine (REMERON) 15 MG tablet Take 15 mg by mouth at bedtime.    [name] morphine (MSIR) 15 MG tablet Take 15 mg by mouth every 6 (six) hours as needed for severe pain.    . Multiple Vitamin (MULTIVITAMIN WITH MINERALS) TABS tablet Take 1 tablet by mouth daily.    . nitroGLYCERIN (NITROSTAT) 0.4 MG SL tablet Place 0.4 mg under the tongue every 5 (five) minutes as needed for chest pain.    [name] omeprazole (PRILOSEC) 20 MG capsule Take 20 mg by mouth daily.    . pregabalin (LYRICA) 150 MG capsule Take 150 mg by mouth 2 (two) times daily.    [name] sulfamethoxazole-trimethoprim (BACTRIM DS,SEPTRA DS) 800-160 MG tablet Take 1 tablet by mouth 2 (two) times daily. 20 tablet 1  . ticagrelor (BRILINTA) 90 MG TABS tablet Take 90 mg by mouth 2 (two) times daily.    [name] venlafaxine XR (EFFEXOR-XR) 150 MG 24 hr capsule Take 150 mg by mouth daily with breakfast.    . vitamin B-12 (CYANOCOBALAMIN) 1000 MCG tablet Take  1,000 mcg by mouth daily.    . Vitamin D, Ergocalciferol, (DRISDOL) 50000 units CAPS capsule Take 50,000 Units by mouth every 7 (seven) days. Pt takes on Sunday.    . ALPRAZolam (XANAX) 0.25 MG tablet Take 1 tablet (0.25 mg total) by mouth 3 (three) times daily as needed for anxiety. 30 tablet 0  . mupirocin cream (BACTROBAN) 2 % Apply topically 2 (two) times daily. 15 g 0  . traZODone (DESYREL) 50 MG tablet Take 1 tablet (50 mg total) by mouth at bedtime. 30 tablet 6    Musculoskeletal: Strength & Muscle Tone: decreased Gait & Station: unable to stand Patient leans: N/A  Psychiatric Specialty Exam: Review of Systems  HENT: Negative.   Eyes: Negative.   Respiratory: Negative.   Cardiovascular: Negative.   Gastrointestinal: Negative.   Musculoskeletal: Positive for falls.  Skin: Negative.   Neurological: Positive for sensory change and weakness.  Psychiatric/Behavioral: Positive for memory loss. Negative for depression, suicidal ideas, hallucinations and substance abuse. The patient is not nervous/anxious and does not have insomnia.     Blood pressure 137/67, pulse 90, temperature 97.8 F (36.6 C), temperature source Oral, resp. rate 18, height '5\' 7"'  (1.702 m), weight 75.751 kg (167 lb), SpO2 97 %.Body mass index is 26.15 kg/(m^2).  General Appearance: Fairly Groomed  Engineer, water::  Good  Speech:  Slow  Volume:  Increased  Mood:  Euthymic  Affect:  Constricted  Thought Process:  Circumstantial  Orientation:  Full (Time, Place, and Person)  Thought Content:  Negative  Suicidal Thoughts:  No  Homicidal Thoughts:  No  Memory:  Immediate;   Good Recent;   Poor Remote;   Fair  Judgement:  Fair  Insight:  Fair  Psychomotor Activity:  Decreased  Concentration:  Fair  Recall:  [employer] of Knowledge:Fair  Language: Fair  Akathisia:  No  Handed:  Right  AIMS (if indicated):     Assets:  Communication Skills Desire for Improvement Financial Resources/Insurance  ADL's:   Impaired  Cognition: Impaired,  Mild  Sleep:      Treatment Plan Summary: Medication management and Plan Patient appears to be doing fine no sign of serious Xanax withdrawal which is what I would expect. She was just as lucid today as she was yesterday. I explained to her again very gently about the dangers of combining sedatives. I empathized with her pain problem and pointed out to her that if she could minimize or discard the Xanax she would probably be able to more safely tolerate more pain medicine. Patient indicated to me that she plans to follow-up with her usual outpatient pain medicine doctor which is certainly her prerogative. I encouraged her to discuss this hospitalization with him while planning future pain treatment. No need for any other psychiatric intervention at this point.  Disposition: Patient does not meet criteria for psychiatric inpatient admission. Supportive therapy provided about ongoing stressors.  Alethia Berthold, MD 04/10/2016 6:43 PM

## 2016-04-10 NOTE — Progress Notes (Signed)
MD making rounds. Discharge orders received. IV discontinued. One prescription given to patient. Also, prescriptions E-Scribed to pharmacy. Provided with Education Handouts. Discharge paperwork provided, explained, signed and witnessed. No unanswered questions. [facility] to [facility]. [facility]. Awaiting transport.

## 2016-04-10 NOTE — Patient Outreach (Signed)
Jessica DanceBarbara Segler is a 70 yo who was [facility] for medication management.  I received a voicemail from patient.  I returned patient's phone call.  Patient reports she called regarding setting up delivery of medication pill boxes from [facility] for next week.  Patient reports she has been in the hospital this week and will be getting released shortly.  Will contact patient on Monday once she has been discharged from the hospital to set up delivery of pill boxes from [facility].  Patient has medications in her pill box at home through next Tuesday.  Advised patient to take medications as prescribed using discharge medication list when she is discharged from the hospital and to remove discontinued medications from pill box.     Lilla Shookachel Takaya Hyslop, Pharm.D. Pharmacy Resident [facility] [phone]

## 2016-04-10 NOTE — Care Management (Signed)
Discharge to home today per Dr. Winona LegatoVaickute. Lives at [facility] x [facility] years. Uses [employer] for transportation. Last seen Dr. Dareen PianoAnderson 3-4 weeks ago. [place] in [place] and [place] [facility] in [place]. Uses a cane and rollator to aid in ambulation. Larey SeatFell prior to coming into the hospital. Moderate appetite. Takes care of all basic activities of daily living herself.  Physical therapy evaluation completed. Already followed by [facility]. Resumption of services and wound care orders faxed to [facility]. Unsure about transportation home. Gwenette GreetBrenda S Moniqua Engebretsen RN MSN CCM Care Management [phone]

## 2016-04-10 NOTE — Evaluation (Signed)
Physical Therapy Evaluation Patient Details Name: Jessica DanceBarbara Coopersmith MRN: [id number] DOB: 1946-09-02 Today's Date: 04/10/2016   History of Present Illness  Pt is a 70 y.o. female presenting to hospital with fall, weakness, AMS, and hypotension.  Pt admitted with somnolence and low BP.  Pt with recent admission d/t R toe cellulitis.  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  PMH includes L 3rd toe amp 11/10/15, L 2nd toe amp 01/24/16, h/o R great toe amp, h/o osteomyelitis, MI, stroke, DM, CAD.  Clinical Impression  Prior to admission, pt was modified independent ambulating with 4ww short distances in home.  Pt lives alone in 1 level apt home with ramp to enter.  Currently pt is independent with bed mobility, modified independent with transfers, and SBA to CGA with ambulation 80 feet with RW.  Pt steady without loss of balance during session and used B post-op shoes for functional mobility.  Pt would benefit from skilled PT to address noted impairments and functional limitations during hospital stay.  Recommend pt discharge to home when medically appropriate; no further PT needs anticipated upon discharge home (pt also declining HHPT).     Follow Up Recommendations No PT follow up    Equipment Recommendations       Recommendations for Other Services       Precautions / Restrictions Precautions Precautions: Fall Required Braces or Orthoses: Other Brace/Splint Other Brace/Splint: Post op shoes B LE's Restrictions Weight Bearing Restrictions: No Other Position/Activity Restrictions: Pt reports no WB'ing restrictions B LE's but needs to use B post op shoes for functional mobility.      Mobility  Bed Mobility Overal bed mobility: Independent             General bed mobility comments: Supine to/from sit  Transfers Overall transfer level: Modified independent Equipment used: Rolling walker (2 wheeled)             General transfer comment: Sit to/from stand steady without loss  of balance  Ambulation/Gait Ambulation/Gait assistance: Supervision;Min guard Ambulation Distance (Feet): 80 Feet Assistive device: Rolling walker (2 wheeled) Gait Pattern/deviations: WFL(Within Functional Limits) Gait velocity: mildly decreased   General Gait Details: steady without loss of balance  Stairs            Wheelchair Mobility    Modified Rankin (Stroke Patients Only)       Balance Overall balance assessment: Needs assistance Sitting-balance support: No upper extremity supported;Feet supported Sitting balance-Leahy Scale: Normal     Standing balance support: Bilateral upper extremity supported (on RW) Standing balance-Leahy Scale: Good                               Pertinent Vitals/Pain Pain Assessment: No/denies pain Pain Score: 8  Pain Location: chronic low back pain with ambulation Pain Descriptors / Indicators: Aching Pain Intervention(s): Limited activity within patient's tolerance;Monitored during session;Repositioned;Patient requesting pain meds-RN notified  Vitals stable and WFL throughout treatment session.    Home Living Family/patient expects to be discharged to:: Private residence Living Arrangements: Alone   Type of Home: Apartment Home Access: Ramped entrance     Home Layout: One level Home Equipment: Cane - single point;Walker - 4 wheels Additional Comments: about 6 steps total to enter apartment building; ramp available if needed    Prior Function Level of Independence: Independent with assistive device(s)         Comments: Pt reports no falls since she  has consistently been using rollator at home and wearing B post-op shoes to even herself out.     Hand Dominance        Extremity/Trunk Assessment   Upper Extremity Assessment: Overall WFL for tasks assessed           Lower Extremity Assessment: Overall WFL for tasks assessed (except B LE peripheral neuropathy)         Communication    Communication: No difficulties  Cognition Arousal/Alertness: Awake/alert Behavior During Therapy: WFL for tasks assessed/performed Overall Cognitive Status: Within Functional Limits for tasks assessed                      General Comments General comments (skin integrity, edema, etc.): no active bleeding R toes beginning or end of session  Nursing cleared pt for participation in physical therapy.  Pt agreeable to PT session.    Exercises        Assessment/Plan    PT Assessment Patient needs continued PT services  PT Diagnosis Difficulty walking   PT Problem List Decreased mobility  PT Treatment Interventions DME instruction;Gait training;Functional mobility training;Therapeutic activities;Therapeutic exercise;Balance training;Patient/family education   PT Goals (Current goals can be found in the Care Plan section) Acute Rehab PT Goals Patient Stated Goal: to go home PT Goal Formulation: With patient Time For Goal Achievement: 04/24/16 Potential to Achieve Goals: Good    Frequency Min 2X/week   Barriers to discharge        Co-evaluation               End of Session Equipment Utilized During Treatment: Gait belt Activity Tolerance: Patient tolerated treatment well Patient left: in bed;with call bell/phone within reach;with bed alarm set Nurse Communication: Mobility status;Patient requests pain meds    Functional Assessment Tool Used: AM-PAC without stairs Functional Limitation: Mobility: Walking and moving around Mobility: Walking and Moving Around Current Status [phone]): At least 20 percent but less than 40 percent impaired, limited or restricted Mobility: Walking and Moving Around Goal Status [phone]): At least 1 percent but less than 20 percent impaired, limited or restricted    Time: 1335-1405 PT Time Calculation (min) (ACUTE ONLY): 30 min   Charges:   PT Evaluation $PT Eval Low Complexity: 1 Procedure PT Treatments $Therapeutic Exercise: 8-22  mins   PT G Codes:   PT G-Codes **NOT FOR INPATIENT CLASS** Functional Assessment Tool Used: AM-PAC without stairs Functional Limitation: Mobility: Walking and moving around Mobility: Walking and Moving Around Current Status ([id number]): At least 20 percent but less than 40 percent impaired, limited or restricted Mobility: Walking and Moving Around Goal Status [phone]): At least 1 percent but less than 20 percent impaired, limited or restricted    Hendricks Limes 04/10/2016, 5:16 PM Hendricks Limes, PT [phone]

## 2016-04-13 ENCOUNTER — Other Ambulatory Visit: Payer: Self-pay | Admitting: Pharmacist

## 2016-04-13 LAB — URINE CULTURE: Culture: 40000 — AB

## 2016-04-14 ENCOUNTER — Other Ambulatory Visit: Payer: Self-pay | Admitting: *Deleted

## 2016-04-14 ENCOUNTER — Ambulatory Visit: Payer: Self-pay | Admitting: *Deleted

## 2016-04-14 NOTE — Patient Outreach (Signed)
Called pt to remind her about home visit scheduled today (done prior to recent hospitalization).  Pt reports wants to cancel today's appointment, not doing good today,they cut her Trazadone to a half, could not sleep.   Discussed with pt doing a transition of care call to which pt agreed.   Pt reports on  recent hospitalization- stroke, had symptoms, slid down on the floor/not a fall.   Pt reports they changed 3 of her medications, took away her Morphine, decreased her Trazadone to one half, decreased Xanax from 1 mg four times a day to 0.25 mg three times a day.  RN CM discussed with pt ER Morphine was stopped, can continue with other Morphine to which pt reports they took away  her Morphine.     Pt reports HH RN came and set up her pill box with lower dose of Trazadone, did not get new rx of Xanax filled.  RN CM discussed with pt importance of taking medications per discharge summary, f/u with Dr. Dareen PianoAnderson post discharge to discuss medication changes.  RN CM offered to call Dr. Dareen PianoAnderson for her to schedule appointment to which pt declined, will call him today.     As discussed with pt, plan to f/u 4/27 - home visit, part of transition of care.   Jessica Alkenose M.   Pierzchala RN CCM [facility] Care Management  [phone]

## 2016-04-14 NOTE — Patient Outreach (Signed)
[facility]) Care Management  [facility]   04/14/2016  Jessica DanceBarbara Donovan 11/26/1946 [id number]  Subjective: Jessica DanceBarbara Donovan is a 69yo who was [facility] for medication management.  I made outreach call to patient to follow up on setting up pill boxes through [facility].  Patient was recently hospitalized from 04/08/16 to 04/10/16 for somnolence, hypotension, polypharmacy, dementia, cellulitis of fourth toe of right foot, and diabetes.    A medication reconciliation was completed with patient over the phone.  Per patient's hospital discharge note, morphine ER 30 mg TID was discontinued, and the following medications were changed: trazodone was reduced to 50 mg daily at bedtime and alprazolam was reduced to 0.25 mg TID as needed.    I discussed with patient the medication changes that were made when patient was discharged from the hospital.  During review of medications patient became upset and reports that she is "not going by the medication list provided to her at hospital discharge."  Patient reports she has continued to take her medications as they were prescribed before she went into the hospital.  Patient reports she will only follow the instructions of her provider, Dr. Emilia BeckMcConville, when it comes to her morphine and alprazolam.  I asked patient whether this provider knew about her recent hospitalization, and patient was not sure.  I informed patient that I would like to call Dr. Emilia BeckMcConville regarding patient's hospitalization and discuss the morphine and alprazolam doses with her provider.  Patient refused to let me call Dr. Emilia BeckMcConville.  Patient is adamant about continuing on the same dosage and ignoring medication recommendations that were made when she was discharged from the hospital.  Patient reports she plans to call Dr. Emilia BeckMcConville today to discuss these medications.    Objective:   Encounter Medications: Outpatient Encounter Prescriptions as of 04/13/2016   Medication Sig  . acetaminophen (TYLENOL) 500 MG tablet Take 1,000 mg by mouth every 4 (four) hours as needed for mild pain or headache.   . ALPRAZolam (XANAX) 0.25 MG tablet Take 1 tablet (0.25 mg total) by mouth 3 (three) times daily as needed for anxiety.  [name]. aspirin EC 81 MG tablet Take 81 mg by mouth daily.  [name]. atorvastatin (LIPITOR) 20 MG tablet Take 20 mg by mouth daily.  . Biotin 5 MG CAPS Take 5 mg by mouth daily at 12 noon.   . cyclobenzaprine (FLEXERIL) 10 MG tablet Take 1 tablet (10 mg total) by mouth 3 (three) times daily as needed for muscle spasms.  . ferrous sulfate 325 (65 FE) MG tablet Take 325 mg by mouth daily with breakfast.  . fesoterodine (TOVIAZ) 8 MG TB24 tablet Take 8 mg by mouth daily.  . insulin aspart (NOVOLOG) 100 UNIT/ML injection Inject 3-15 Units into the skin 3 (three) times daily with meals as needed for high blood sugar. Pt uses as needed per sliding scale:    Less than 140:  0 units  140-180:  3 units 181-220:  4 units 221- 260:  6 units 261- 320:  8 units 321-360:  10 units 361-400:  12 units Greater than 400:  15 units  . Insulin Glargine (TOUJEO SOLOSTAR) 300 UNIT/ML SOPN Inject 50 Units into the skin daily.  . isosorbide mononitrate (IMDUR) 30 MG 24 hr tablet Take 30 mg by mouth daily.   [name]. lisinopril (PRINIVIL,ZESTRIL) 20 MG tablet Take 20 mg by mouth daily.  [name]. loratadine (CLARITIN) 10 MG tablet Take 10 mg by mouth daily.  .[name]  metoprolol (LOPRESSOR) 50 MG tablet Take 25 mg by mouth 2 (two) times daily.   . mirtazapine (REMERON) 15 MG tablet Take 15 mg by mouth at bedtime.  [name] morphine (MSIR) 15 MG tablet Take 15 mg by mouth every 6 (six) hours as needed for severe pain.  . Multiple Vitamin (MULTIVITAMIN WITH MINERALS) TABS tablet Take 1 tablet by mouth daily.  . mupirocin cream (BACTROBAN) 2 % Apply topically 2 (two) times daily.  . nitroGLYCERIN (NITROSTAT) 0.4 MG SL tablet Place 0.4 mg under the tongue every 5 (five) minutes as needed for chest  pain.  [name] omeprazole (PRILOSEC) 20 MG capsule Take 20 mg by mouth daily.  . pregabalin (LYRICA) 150 MG capsule Take 150 mg by mouth 2 (two) times daily.  [name] sulfamethoxazole-trimethoprim (BACTRIM DS,SEPTRA DS) 800-160 MG tablet Take 1 tablet by mouth 2 (two) times daily.  . ticagrelor (BRILINTA) 90 MG TABS tablet Take 90 mg by mouth 2 (two) times daily.  . traZODone (DESYREL) 50 MG tablet Take 1 tablet (50 mg total) by mouth at bedtime.  [name] venlafaxine XR (EFFEXOR-XR) 150 MG 24 hr capsule Take 150 mg by mouth daily with breakfast.  . vitamin B-12 (CYANOCOBALAMIN) 1000 MCG tablet Take 1,000 mcg by mouth daily.  . Vitamin D, Ergocalciferol, (DRISDOL) 50000 units CAPS capsule Take 50,000 Units by mouth every 7 (seven) days. Pt takes on Sunday.   No facility-administered encounter medications on file as of 04/13/2016.    Functional Status: In your present state of health, do you have any difficulty performing the following activities: 04/09/2016 04/03/2016  Hearing? N N  Vision? N N  Difficulty concentrating or making decisions? N N  Walking or climbing stairs? Y N  Dressing or bathing? N N  Doing errands, shopping? Y N  Preparing Food and eating ? - -  Using the Toilet? - -  In the past six months, have you accidently leaked urine? - -  Do you have problems with loss of bowel control? - -  Managing your Medications? - -  Managing your Finances? - -  Housekeeping or managing your Housekeeping? - -    Fall/Depression Screening: PHQ 2/9 Scores 03/09/2016 02/21/2016  PHQ - 2 Score 2 1  PHQ- 9 Score 4 6    Assessment: 1.  Medication reconciliation:    Drugs sorted by system:  Neurologic/Psychologic: alprazolam, mirtazapine, trazodone, venlafaxine  Cardiovascular: aspirin, atorvastatin, isosorbide mononitrate, lisinopril, metoprolol tartrate, nitroglycerin SL, ticagrelor  Pulmonary/Allergy: loratadine  Gastrointestinal: omeprazole  Endocrine: Novolog, Tuojeo  Renal:  fesoterodine  Topical: mupirocin cream  Pain: acetaminophen, cyclobenzaprine, morphine ER (discontinued; however, patient reports she continues to take), morphine IR, pregabalin  Vitamins/Minerals: biotin, cyanocobalamin, ergocalciferol, ferrous sulfate, multivitamin  Infectious Diseases: sulfamethoxazole-trimethoprim   Gaps in therapy: none noted  Medications to avoid in the elderly:  - Alprazolam (increase risk of cognitive impairment, delirium, falls, fractures, and motor vehicle crashes) - Omeprazole (risk of Clostridium difficile infection and bone loss and fractures)  Drug interactions: patient is on multiple medications that may increase risk of respiratory and CNS depression including alprazolam, morphine, mirtazapine, cyclobenzaprine; and is on multiple medications that may increase risk of serotonin syndrome including cyclobenzaprine, mirtazapine, morphine, trazodone, and venlafaxine   Other issues noted:   Patient continues to take morphine ER, alprazolam 1 mg tablets, and trazodone 100 mg despite instructions to discontinue morphine ER and reduce alprazolam and trazodone following hospital discharge.  Patient's care is complication as she currently has two primary care providers, and patient  has refused to allow me to contact the provider who prescribes her pain medications and benzodiazepines.  I discussed with patient the adverse effects of overmedication including risks of respiratory and CNS depression.  I expressed great concern for her safety and well being; however, patient reports that she will only listen to Dr. Emilia Beck regarding opioid and benzodiazepine dosing.  I advised patient to follow the medication list that she was given on 04/10/16 following hospital discharge.  Patient is adamant that she is going to continue to take the medications how they were prescribed before she went to the hospital until she talks with Dr. Emilia Beck.  Patient reports she plans to call his  office today.    2.  Medication management:  Patient's pill boxes are currently set up by home health care.  I contacted Merichelle at [facility] regarding setting up future pill boxes.  Reviewed with Mrs. Goding which medications she wants in her pill box and provided the list to Merichelle.  Patient requested to have the following medications in her pill box: aspirin, atorvastatin, ergocalciferol, ferrous sulfate, fesoterodine, isosorbide mononitrate, lisinopril, metoprolol tartrate, mirtazapine, omeprazole, trazodone, and venlafaxine.  Lyrica, cyclobenzaprine, morphine, and alprazolam will not be included in pill box from [facility] as these medications were most recently filled from [facility] and cannot be included in pill box filled by [employer].  In addition, patient receives Brillinta from patient assistance program.  Patient is aware that Flonnie Overman will not be in pill box, and she will have to add it to her pill box once it is delivered.  Patient is also aware that PRN medications will not be in pill box.  Merichelle reports she will process patient's medications and contact patient to set up delivery of pill box.  I confirmed with patient that home health is scheduled to come visit today and can fill pill box until [facility] begins their delivery.    Addendum:  I spoke to patient's home health nurse Trula Ore while she was at patient's home.  Trula Ore reports she filled patient's pill box through 04/19/16.  Trula Ore reports she did not plan morphine ER or alprazolam in pill box, and she placed 1/2 of a trazodone 100 mg tablet in pill box.    Plan: Advised patient to take all medications as prescribed using discharge medication list from 04/10/16.   [facility] will contact patient regarding delivery of the pill box.  I will route my note to patient's [facility] provider, Dr. Dareen Piano, to update him.   Follow up home visit scheduled for 04/20/16.    [facility] CM Care Plan Problem One        Most  Recent Value   Care Plan Problem One  Medication management   Role Documenting the Problem One  Clinical Pharmacist   Care Plan for Problem One  Active   [facility] CM Short Term Goal #1 (0-30 days)  Patient will transition prescription medications to [facility] and will enroll in bi-weekly pill box fills through [facility] pharmacy in the next 30 days.    [facility] CM Short Term Goal #1 Start Date  03/17/16   Interventions for Short Term Goal #1  Assisted patient in checking on status of prescriptions at [facility].  Discussed pill boxes with patient and Merichelle and provided patient's medication list including which medications patient would like to have in her pill box.  Home health will continue to assist patient with pill box fills at this time.  Confirmed with patient that home health  nurse is scheduled to come out today.     [facility] CM Care Plan Problem Two        Most Recent Value   Care Plan Problem Two  Medication adherence   Role Documenting the Problem Two  Clinical Pharmacist   Care Plan for Problem Two  Active   [facility] CM Short Term Goal #1 (0-30 days)  Patient will take all medication as prescribed over the next 7 days per patient report.    [facility] CM Short Term Goal #1 Start Date  04/13/16   Interventions for Short Term Goal #2   Reviewed patient's discharge medication list and discussed purpose, proper use, and adverse effects of all medications.  Counseled patient on importanace of taking medications as prescribed.  Advised patient to follow instructions on discharge medication list.       Lilla Shook, Pharm.D. Pharmacy Resident [facility] [phone]

## 2016-04-15 DIAGNOSIS — M5442 Lumbago with sciatica, left side: Secondary | ICD-10-CM | POA: Diagnosis not present

## 2016-04-15 DIAGNOSIS — I10 Essential (primary) hypertension: Secondary | ICD-10-CM | POA: Diagnosis not present

## 2016-04-15 DIAGNOSIS — R55 Syncope and collapse: Secondary | ICD-10-CM | POA: Diagnosis not present

## 2016-04-15 DIAGNOSIS — M6281 Muscle weakness (generalized): Secondary | ICD-10-CM | POA: Diagnosis not present

## 2016-04-15 DIAGNOSIS — F419 Anxiety disorder, unspecified: Secondary | ICD-10-CM | POA: Diagnosis not present

## 2016-04-15 DIAGNOSIS — R296 Repeated falls: Secondary | ICD-10-CM | POA: Diagnosis not present

## 2016-04-15 DIAGNOSIS — S0003XD Contusion of scalp, subsequent encounter: Secondary | ICD-10-CM | POA: Diagnosis not present

## 2016-04-15 DIAGNOSIS — E1140 Type 2 diabetes mellitus with diabetic neuropathy, unspecified: Secondary | ICD-10-CM | POA: Diagnosis not present

## 2016-04-15 DIAGNOSIS — I25119 Atherosclerotic heart disease of native coronary artery with unspecified angina pectoris: Secondary | ICD-10-CM | POA: Diagnosis not present

## 2016-04-15 DIAGNOSIS — M5441 Lumbago with sciatica, right side: Secondary | ICD-10-CM | POA: Diagnosis not present

## 2016-04-16 ENCOUNTER — Other Ambulatory Visit: Payer: Self-pay | Admitting: *Deleted

## 2016-04-17 NOTE — Patient Outreach (Signed)
[facility]) Care Management   Home visit 04/16/16  Jessica Donovan 01-23-1946 [id number]  Jessica Donovan is an 70 y.o. female  Subjective:  Pt reports she will be moving to a handicap room,been waiting for a year and a half to move, to go  back to using her motorized wheelchair.   Pt reports she does not agree with the hospital MD that recent hospitalization was not result of a stroke but medication related, thus cutting down on her Xanax, Trazodone, cutting out her ER morphine.  Pt reports she is going back on the 100 mg of Trazodone because she has not slept since discharge, to continue to take  Xanax 1 mg but cutting down to 3 a day, has 3 MS contin left until she get a new rx from Dr. Delila Spence so will cut down on the Morphine 15 mg to three a day.  Pt reports Dr. Delila Spence is the one who prescribes Xanax and Morphine, has been treating me for 30 years, understands my pain, anxiety.   Pt reports she is to f/u with Dr. Ouida Sills 5/8, talked to MD about her medications in the past, does not like it.    Pt also reports she has been taking generic Zyrtec in the morning for 6-9 months.    Objective:  Filed Vitals:   04/16/16 1524  BP: 110/70  Pulse: 77  Resp: 16    ROS  Physical Exam  Constitutional: She is oriented to person, place, and time. She appears well-developed and well-nourished.  Cardiovascular: Normal rate, regular rhythm and normal heart sounds.   Respiratory: Effort normal and breath sounds normal.  GI: Soft.  Musculoskeletal: Normal range of motion.  Neurological: She is alert and oriented to person, place, and time.  Skin: Skin is warm and dry.  Psychiatric: She has a normal mood and affect. Her behavior is normal. Thought content normal.    Encounter Medications:   Outpatient Encounter Prescriptions as of 04/16/2016  Medication Sig Note  . acetaminophen (TYLENOL) 500 MG tablet Take 1,000 mg by mouth every 4 (four) hours as needed for mild pain or  headache.    [name] aspirin EC 81 MG tablet Take 81 mg by mouth daily.   [name] atorvastatin (LIPITOR) 20 MG tablet Take 20 mg by mouth daily.   . Biotin 5 MG CAPS Take 5 mg by mouth daily at 12 noon.    . cyclobenzaprine (FLEXERIL) 10 MG tablet Take 1 tablet (10 mg total) by mouth 3 (three) times daily as needed for muscle spasms.   . ferrous sulfate 325 (65 FE) MG tablet Take 325 mg by mouth daily with breakfast.   . fesoterodine (TOVIAZ) 8 MG TB24 tablet Take 8 mg by mouth daily.   . insulin aspart (NOVOLOG) 100 UNIT/ML injection Inject 3-15 Units into the skin 3 (three) times daily with meals as needed for high blood sugar. Pt uses as needed per sliding scale:    Less than 140:  0 units  140-180:  3 units 181-220:  4 units 221- 260:  6 units 261- 320:  8 units 321-360:  10 units 361-400:  12 units Greater than 400:  15 units   . Insulin Glargine (TOUJEO SOLOSTAR) 300 UNIT/ML SOPN Inject 50 Units into the skin daily.   . isosorbide mononitrate (IMDUR) 30 MG 24 hr tablet Take 30 mg by mouth daily.    [name] lisinopril (PRINIVIL,ZESTRIL) 20 MG tablet Take 20 mg by mouth daily.   . metoprolol (LOPRESSOR)  50 MG tablet Take 25 mg by mouth 2 (two) times daily.    . mirtazapine (REMERON) 15 MG tablet Take 15 mg by mouth at bedtime.   [name] morphine (MSIR) 15 MG tablet Take 15 mg by mouth every 6 (six) hours as needed for severe pain.   . Multiple Vitamin (MULTIVITAMIN WITH MINERALS) TABS tablet Take 1 tablet by mouth daily.   . mupirocin cream (BACTROBAN) 2 % Apply topically 2 (two) times daily.   [name] omeprazole (PRILOSEC) 20 MG capsule Take 20 mg by mouth daily.   . pregabalin (LYRICA) 150 MG capsule Take 150 mg by mouth 2 (two) times daily.   [name] sulfamethoxazole-trimethoprim (BACTRIM DS,SEPTRA DS) 800-160 MG tablet Take 1 tablet by mouth 2 (two) times daily.   . ticagrelor (BRILINTA) 90 MG TABS tablet Take 90 mg by mouth 2 (two) times daily.   . traZODone (DESYREL) 50 MG tablet Take 1 tablet (50 mg total) by  mouth at bedtime. 04/16/2016: Pt reports to start taking Trazadone 100 mg tonight,not sleeping.    . venlafaxine XR (EFFEXOR-XR) 150 MG 24 hr capsule Take 150 mg by mouth daily with breakfast.   . vitamin B-12 (CYANOCOBALAMIN) 1000 MCG tablet Take 1,000 mcg by mouth daily.   . Vitamin D, Ergocalciferol, (DRISDOL) 50000 units CAPS capsule Take 50,000 Units by mouth every 7 (seven) days. Pt takes on Sunday.   . ALPRAZolam (XANAX) 0.25 MG tablet Take 1 tablet (0.25 mg total) by mouth 3 (three) times daily as needed for anxiety. (Patient not taking: Reported on 04/16/2016)   . loratadine (CLARITIN) 10 MG tablet Take 10 mg by mouth daily. Reported on 04/16/2016 04/16/2016: Pt does not have   . nitroGLYCERIN (NITROSTAT) 0.4 MG SL tablet Place 0.4 mg under the tongue every 5 (five) minutes as needed for chest pain. Reported on 04/16/2016 04/16/2016: On hand if needed    No facility-administered encounter medications on file as of 04/16/2016.    Functional Status:   In your present state of health, do you have any difficulty performing the following activities: 04/09/2016 04/03/2016  Hearing? N N  Vision? N N  Difficulty concentrating or making decisions? N N  Walking or climbing stairs? Y N  Dressing or bathing? N N  Doing errands, shopping? Y N  Preparing Food and eating ? - -  Using the Toilet? - -  In the past six months, have you accidently leaked urine? - -  Do you have problems with loss of bowel control? - -  Managing your Medications? - -  Managing your Finances? - -  Housekeeping or managing your Housekeeping? - -    Fall/Depression Screening:    PHQ 2/9 Scores 03/09/2016 02/21/2016  PHQ - 2 Score 2 1  PHQ- 9 Score 4 6    Assessment:   Pt lives in a one room apartment in [facility] senior apartments, soon to move to a Handicap room in same facility.            Medication compliance - pt not Xanax as directed, as reported to go back to higher dose of Trazadone, go back on Morphine ER.     Pt been taking OTC Zytec in the morning to which RN CM discussed s/e drowsiness, pt willing to switch to Claritin (on recent discharge summary).   [facility] pharmacist Elisabeth Most assisting pt in getting medications switched to [place].             Fall risk-  Pt at risk  for falls taking several medications that have side effect of drowsiness.               Plan:  Pt to f/u with Dr. Ouida Sills 5/8, as discussed-  talk to MD not in agreement with medication changes.            As discussed, pt to stop taking OTC Zyrtec, switch to Claritin.              Pt to use walker at all times, motorized wheelchair as needed.             Plan to continue to follow pt for transition of care call, f/u again telephonically 5/4.    [facility] CM Care Plan Problem Three        Most Recent Value   Care Plan Problem Three  Risk for readmission related to recent hospitalization  (7 admits in 6 months, 2 this month)   Role Documenting the Problem Three  Care Management [place] for Problem Three  Active   THN Long Term Goal (31-90) days  Pt would not readmit  31 days from day of discharge.    THN Long Term Goal Start Date  04/14/16   Interventions for Problem Three Long Term Goal  home visit done- discussed medication compliance    THN CM Short Term Goal #1 (0-30 days)  Pt would f/u with Dr. Ouida Sills in the next 10 days    [facility] CM Short Term Goal #1 Start Date  04/14/16   [facility] CM Short Term Goal #1 Met Date  -- [not met- pt reports has an appointment to see MD 5/8]   Interventions for Short Term Goal #1  Discussed with pt importance of f/u with Dr. Ouida Sills post discharge, pt to call MD today to schedule appointment    [facility] CM Short Term Goal #2 (0-30 days)  Pt would take medications per discharge instructions in the next 7 days    THN CM Short Term Goal #2 Start Date  04/14/16   [facility] CM Short Term Goal #2 Met Date  -- [not met- pt back to taking old rx of Xanax, Morphine.  ]   Interventions for Short Term Goal  #2  Advised pt to take medications per discharge instructions, meds that were stopped, lowered has s/e of drowsiness/risk for falls.    THN CM Short Term Goal #3 (0-30 days)  Pt would discuss discharge medications changes with Dr. Ouida Sills in the next 14 days, during f/u visit.    THN  CM Short Term Goal #3 Start Date  04/16/16   Interventions for Short Term Goal #3  Discussed with pt talking to Dr Ouida Sills at upcoming office visit compliance with medication changes     Zara Chess.   [facility]  [phone]

## 2016-04-18 LAB — CULTURE, BLOOD (ROUTINE X 2)
CULTURE: NO GROWTH
Culture: NO GROWTH

## 2016-04-20 ENCOUNTER — Other Ambulatory Visit: Payer: Self-pay | Admitting: Pharmacist

## 2016-04-20 DIAGNOSIS — M5441 Lumbago with sciatica, right side: Secondary | ICD-10-CM | POA: Diagnosis not present

## 2016-04-20 DIAGNOSIS — E1140 Type 2 diabetes mellitus with diabetic neuropathy, unspecified: Secondary | ICD-10-CM | POA: Diagnosis not present

## 2016-04-20 DIAGNOSIS — M5442 Lumbago with sciatica, left side: Secondary | ICD-10-CM | POA: Diagnosis not present

## 2016-04-20 DIAGNOSIS — S0003XD Contusion of scalp, subsequent encounter: Secondary | ICD-10-CM | POA: Diagnosis not present

## 2016-04-20 DIAGNOSIS — M6281 Muscle weakness (generalized): Secondary | ICD-10-CM | POA: Diagnosis not present

## 2016-04-20 DIAGNOSIS — I25119 Atherosclerotic heart disease of native coronary artery with unspecified angina pectoris: Secondary | ICD-10-CM | POA: Diagnosis not present

## 2016-04-20 DIAGNOSIS — I10 Essential (primary) hypertension: Secondary | ICD-10-CM | POA: Diagnosis not present

## 2016-04-20 DIAGNOSIS — R55 Syncope and collapse: Secondary | ICD-10-CM | POA: Diagnosis not present

## 2016-04-20 DIAGNOSIS — R296 Repeated falls: Secondary | ICD-10-CM | POA: Diagnosis not present

## 2016-04-20 DIAGNOSIS — F419 Anxiety disorder, unspecified: Secondary | ICD-10-CM | POA: Diagnosis not present

## 2016-04-20 NOTE — Patient Outreach (Signed)
[facility]) Care Management  [place]   04/20/2016  Sabre Romberger 11-03-1946 [id number]  Subjective: Yaretsi Humphres is a 70yo who was referred to [place] for medication management.  I made follow up home visit today.  Patient has not received her medication refills or pill boxes from [place] yet.  Patient reports her phone was not working at the end of last week/over the weekend, and [facility] may have tried to call her regarding the pill boxes but she is not sure.  Outreach call to [place] to follow up on setting up pill box fills.  I spoke to [place] who states that [place] who has been working on Mrs. Gravelle' prescriptions is not in today, but will be in tomorrow.     Objective:   Encounter Medications: Outpatient Encounter Prescriptions as of 04/20/2016  Medication Sig Note  . acetaminophen (TYLENOL) 500 MG tablet Take 1,000 mg by mouth every 4 (four) hours as needed for mild pain or headache.    . ALPRAZolam (XANAX) 0.25 MG tablet Take 1 tablet (0.25 mg total) by mouth 3 (three) times daily as needed for anxiety. (Patient taking differently: Take 1 mg by mouth 3 (three) times daily as needed for anxiety. )   . aspirin EC 81 MG tablet Take 81 mg by mouth daily.   [name] atorvastatin (LIPITOR) 20 MG tablet Take 20 mg by mouth daily.   . Biotin 5 MG CAPS Take 5 mg by mouth daily at 12 noon.    . cyclobenzaprine (FLEXERIL) 10 MG tablet Take 1 tablet (10 mg total) by mouth 3 (three) times daily as needed for muscle spasms.   . ferrous sulfate 325 (65 FE) MG tablet Take 325 mg by mouth daily with breakfast.   . fesoterodine (TOVIAZ) 8 MG TB24 tablet Take 8 mg by mouth daily.   . insulin aspart (NOVOLOG) 100 UNIT/ML injection Inject 3-15 Units into the skin 3 (three) times daily with meals as needed for high blood sugar. Pt uses as needed per sliding scale:    Less than 140:  0 units  140-180:  3 units 181-220:  4 units 221- 260:  6 units 261- 320:   8 units 321-360:  10 units 361-400:  12 units Greater than 400:  15 units   . Insulin Glargine (TOUJEO SOLOSTAR) 300 UNIT/ML SOPN Inject 50 Units into the skin daily.   . isosorbide mononitrate (IMDUR) 30 MG 24 hr tablet Take 30 mg by mouth daily.    [name] lisinopril (PRINIVIL,ZESTRIL) 20 MG tablet Take 20 mg by mouth daily.   [name] loratadine (CLARITIN) 10 MG tablet Take 10 mg by mouth daily. Reported on 04/16/2016   . metoprolol (LOPRESSOR) 50 MG tablet Take 25 mg by mouth 2 (two) times daily.    . mirtazapine (REMERON) 15 MG tablet Take 15 mg by mouth at bedtime.   [name] morphine (MSIR) 15 MG tablet Take 15 mg by mouth every 6 (six) hours as needed for severe pain.   . Multiple Vitamin (MULTIVITAMIN WITH MINERALS) TABS tablet Take 1 tablet by mouth daily.   . mupirocin cream (BACTROBAN) 2 % Apply topically 2 (two) times daily.   . nitrofurantoin, macrocrystal-monohydrate, (MACROBID) 100 MG capsule Take 100 mg by mouth daily.   . nitroGLYCERIN (NITROSTAT) 0.4 MG SL tablet Place 0.4 mg under the tongue every 5 (five) minutes as needed for chest pain. Reported on 04/16/2016 04/16/2016: On hand if needed   . omeprazole (PRILOSEC) 20  MG capsule Take 20 mg by mouth daily.   . pregabalin (LYRICA) 150 MG capsule Take 150 mg by mouth 2 (two) times daily.   . ticagrelor (BRILINTA) 90 MG TABS tablet Take 90 mg by mouth 2 (two) times daily.   . traZODone (DESYREL) 50 MG tablet Take 1 tablet (50 mg total) by mouth at bedtime. 04/16/2016: Pt reports to start taking Trazadone 100 mg tonight,not sleeping.    . venlafaxine XR (EFFEXOR-XR) 150 MG 24 hr capsule Take 150 mg by mouth daily with breakfast.   . vitamin B-12 (CYANOCOBALAMIN) 1000 MCG tablet Take 1,000 mcg by mouth daily.   . Vitamin D, Ergocalciferol, (DRISDOL) 50000 units CAPS capsule Take 50,000 Units by mouth every 7 (seven) days. Pt takes on Sunday.   . [DISCONTINUED] sulfamethoxazole-trimethoprim (BACTRIM DS,SEPTRA DS) 800-160 MG tablet Take 1 tablet by  mouth 2 (two) times daily. (Patient not taking: Reported on 04/20/2016)    No facility-administered encounter medications on file as of 04/20/2016.   Functional Status: In your present state of health, do you have any difficulty performing the following activities: 04/09/2016 04/03/2016  Hearing? N N  Vision? N N  Difficulty concentrating or making decisions? N N  Walking or climbing stairs? Y N  Dressing or bathing? N N  Doing errands, shopping? Y N  Preparing Food and eating ? - -  Using the Toilet? - -  In the past six months, have you accidently leaked urine? - -  Do you have problems with loss of bowel control? - -  Managing your Medications? - -  Managing your Finances? - -  Housekeeping or managing your Housekeeping? - -   Fall/Depression Screening: PHQ 2/9 Scores 03/09/2016 02/21/2016  PHQ - 2 Score 2 1  PHQ- 9 Score 4 6    Assessment: 1.  Medication management: Patient reports home health care has not been to home to set up pill boxes this week.  I called patient's home health nurse to find out when next home visit is; however, there was no answer.  Patient reports she filled her pill box yesterday for two days.  Reviewed pill box.  Patient has completed course of Bactrim for her foot and has resumed nitrofurantoin for UTI prophylaxis.  I assisted patient in filling her pill box through 04/24/16 with the following medications: aspirin, atorvastatin, ferrous sulfate, isosorbide mononitrate, lisinopril, metoprolol, mirtazapine, omeprazole, Lyrica, Brilinta, nitrofurantoin, venlafaxine, and trazodone (1/2 of 100 mg tablet).  Alprazolam, morphine, and cyclobenzaprine were not included in patient's pill box.  Patient has all medications at this time except Toviaz.  Also, she only had enough isosorbide through 04/22/16.  Placed a slip of paper in boxes missing isosorbide to remind patient to add the medication once refilled from the pharmacy.    I contacted [place] at [facility] regarding  setting up future pill boxes.  Merichelle was off today.  I was advised to call back tomorrow when she returns to work.  I reviewed again with Mrs. Goodgame which medications she wants in her pill box.  Provided Mrs. Panameno a list of the medications that will not be in her future pill boxes from [place]: Lyrica as this medication was most recently filled from [place] and cannot be included in pill box filled by [facility]; Pattricia Boss as patient receives from patient assistance program; PRN medications such as morphine IR, alprazolam, cyclobenzaprine, acetaminophen, nitroglycerin SL; and over the counter medications such as biotin, cyanocobalamin, loratadine and her multivitamin.  2.  Medication adherence:  Patient reports adherence with medications.  Patient was recently hospitalized and the following medication changes were made:  morphine ER 30 mg TID was discontinued, trazodone was reduced to 50 mg daily at bedtime and alprazolam was reduced to 0.25 mg TID as needed.  Patient reports she has continued to take alprazolam 78m TID as needed and continues to take morphine ER 30 mg tablets (only has 3 remaining at this time).  Patient previously stated she will only follow instructions of her provider, Dr. MDelila Spence when it comes to her morphine and alprazolam, and refuses to allow me to speak to her provider regarding these medications.  Patient reports she spoke with her provider, Dr. MDelila Spence regarding her hospitalization and the medication changes made.  She reports Dr. MDelila Spencehas continued her on previously prescribed doses of morphine ER and alprazolam.  I spent extensive time counseling patient on purpose, proper use, and adverse effects of morphine and alprazolam including the difference of scheduled ER morphine and as needed IR morphine.  I discussed with patient the potential adverse effects of overmedication including risk of respiratory and CNS depression.  I continued to express  great concern for her safety and well being; however, patient is adamant about continuing on the same dosage and ignoring medication recommendations that were made when she was discharged from the hospital.      3.  Medication assistance:  Informed [place] and TLisbeth Plywas faxed to Dr. MDelila Spence but I have not received response.  Patient plans to follow up with Dr. MDelila Spencethis week regarding prescription refills.  Provided patient with the application to take with her to Dr. MMamie Leversoffice.     Plan: 1.  Counseled patient on importance of taking all medications as prescribed.   2.  I will call [place] to check on status of patient's prescriptions and initiation of pill box refills.  I will update patient.    [facility] Problem [facility]        Most Recent Value   Care Plan Problem One  Medication management   Role Documenting the Problem One  Clinical Pharmacist   Care Plan for Problem One  Active   THN CM Short Term Goal #1 (0-30 days)  Patient [place] in bi-weekly pill box fills through [place] the next 30 days.    THN CM Short Term Goal #1 Start Date  04/21/16   THN CM Short Term Goal #1 Met Date  -- [goal renewed]   Interventions for Short Term Goal #1  Assisted patient in checking on status of prescriptions at [place]  Was unable to check on status of prescriptions [facility].  Assisted patient in filling her pill box for this week and will continue to work with Merichelle to get pill box fills set up through [place]    THN CM Care Plan Problem Two        Most Recent Value   Care Plan Problem Two  Medication adherence   Role Documenting the Problem Two  Clinical Pharmacist   Care Plan for Problem Two  Active   THN CM Short Term Goal #1 (0-30 days)  Patient will take all  medication as prescribed over the next 7 days per patient report.    THN CM Short Term Goal #1 Start Date  04/13/16  THN CM Short Term Goal #1 Met Date   04/20/16 Barrie Folk not met - renewed goal]   Interventions for Short Term Goal #2   Reviewed medications and extensively discussed purpose, proper use, and adverse effects of all medications.  Counseled patient on importance of taking mediations as prescribed.      Elisabeth Most, Pharm.D. Pharmacy Resident [place] [phone]

## 2016-04-21 ENCOUNTER — Other Ambulatory Visit: Payer: Self-pay | Admitting: Pharmacist

## 2016-04-21 NOTE — Patient Outreach (Signed)
Care coordination:  [facility] to check on the status of setting up pill box fills for patent.  Merichelle reports that she tried to contact patient multiple times last week to set up pill box fills, left multiple messages, but has been unable to reach the patient.  Merichelle reports she has to talk to patient directly before she can proceed with the order.  I informed Merichelle that patient's phone was out of service for part of last week but is now working.  Encouraged Merichelle to try to reach out to patient again.    I placed phone call to patient to update her on this information.  There was no answer.  I left a HIPAA compliant voicemail for patient to return my phone call.   Will try a second outreach attempt on 04/22/16 if patient does not return my phone call today.     Lilla Shookachel Emon Miggins, Pharm.D. Pharmacy Resident [facility] [phone]

## 2016-04-22 ENCOUNTER — Other Ambulatory Visit: Payer: Self-pay | Admitting: Pharmacist

## 2016-04-22 NOTE — Patient Outreach (Signed)
Care coordination:  [facility] to check on the status of setting up pill box fills for patient.  Merichelle reports that she has tried to contact patient multiple times last week to set up pill box fills, left multiple messages, but has been unable to reach the patient.  Merichelle reports she needs to talk to patient directly before she can proceed with setting up pill box fills.   I placed a second phone call to patient to update her on this information.  Patient's phone went straight to voicemail.  I left a HIPAA complaint voicemail for patient to return my phone call.    I made a call to patient's emergency contact, [name], who reports she will be seeing the patient today.  Asked [name] to inform the patient that in order to get her medications filled in pill boxes and [facility], patient needs to call [facility] directly and speak to Merichelle.  Requested that patient call [facility] ASAP in order to get pill boxes set up.  [name] reports she will pass along this message to the patient.  Mamie Laureldvised Anna to call me if she or the patient has any questions or concerns.    I updated [facility] [name] regarding patient's case.  [facility] has telephone call scheduled with patient on 04/23/16 and will follow up on status of pill box fills at that time.    Lilla Shookachel Elienai Gailey, Pharm.D. Pharmacy Resident [facility] [phone]

## 2016-04-23 ENCOUNTER — Ambulatory Visit: Payer: PPO | Admitting: *Deleted

## 2016-04-23 ENCOUNTER — Ambulatory Visit: Payer: Self-pay | Admitting: *Deleted

## 2016-04-23 ENCOUNTER — Other Ambulatory Visit: Payer: Self-pay | Admitting: *Deleted

## 2016-04-23 NOTE — Patient Outreach (Signed)
Attempt made to contact pt for ongoing transition of care (discharged 4/21).  HIPPA compliant voice message left with contact name and number.  If no response, will try again.     [name] M.   Domique Clapper RN CCM [facility]  [phone]

## 2016-04-24 ENCOUNTER — Encounter: Payer: Self-pay | Admitting: Pharmacist

## 2016-04-24 ENCOUNTER — Other Ambulatory Visit: Payer: Self-pay | Admitting: Pharmacist

## 2016-04-24 NOTE — Patient Outreach (Signed)
Care coordination:  Phone call to [name] to follow up on getting medications set up [facility].  [facility] 04[facility]16 regarding status of setting up pill box fills for patient.  Merichelle reports that she has tried to contact patient multiple times last week to set up pill box fills, left multiple messages, but has been unable to reach the patient.  Merichelle reports she needs to talk to patient directly before she can proceed with setting up pill boxes.  I made a third outreach attempt to reach patient to update her on this information.  Patient's phone continues to go straight to voicemail when I call.  I left a HIPAA compliant voicemail for patient to return my phone call.    I will send outreach letter to patient as I have made three attempts to reach [name] without success.  Will wait for patient's response.  Will close patient's case in 10 days if patient does not respond to my voicemail messages or outreach letter.     [name], Pharm.D. Pharmacy Resident [facility] [phone]

## 2016-04-27 DIAGNOSIS — I2510 Atherosclerotic heart disease of native coronary artery without angina pectoris: Secondary | ICD-10-CM | POA: Diagnosis not present

## 2016-04-27 DIAGNOSIS — E1140 Type 2 diabetes mellitus with diabetic neuropathy, unspecified: Secondary | ICD-10-CM | POA: Diagnosis not present

## 2016-04-27 DIAGNOSIS — I10 Essential (primary) hypertension: Secondary | ICD-10-CM | POA: Diagnosis not present

## 2016-04-27 DIAGNOSIS — M4806 Spinal stenosis, lumbar region: Secondary | ICD-10-CM | POA: Diagnosis not present

## 2016-04-29 ENCOUNTER — Other Ambulatory Visit: Payer: Self-pay | Admitting: Pharmacist

## 2016-04-29 ENCOUNTER — Other Ambulatory Visit: Payer: Self-pay | Admitting: *Deleted

## 2016-04-29 NOTE — Patient Outreach (Signed)
Transition of care call successful (week 3, discharged 4/21).  Spoke with pt, reports been having a lot of trouble with pain in right knee, shoulder, sciatic nerve.  Pt reports f/u with Dr. Dareen PianoAnderson 5/8, MD to refer to specialist (cortizone shot in right knee/shoulder), epidural in back.  Pt reports she [facility], called this am about delivery for me.  Pt reports [facility], saw Dr. Emilia BeckMcConville (manages my pain),gave prescriptions on refills  for 15 medications that she will  give to [facility].  Pt reports she went over the discharge medications with Dr. Dareen PianoAnderson, changes made to Xanax, Trazodone, pain medication to which MD told her she could take whatever Dr. Shari HeritageMc Conville recommends.   Pt reports HH RN is coming twice a week- change dressing to fourth toe/right foot and she does the remaining days.  Pt reports to f/u with Dr. Clide CliffKline 5/15- assess toe.  As discussed with pt, plan to f/u again  telephonically 5/17 - part of ongoing transition of care.     [name] M.   Pierzchala RN CCM [facility] Care Management  [phone]

## 2016-04-30 NOTE — Patient Outreach (Signed)
[facility]) Care Management  Jessica Donovan   04/30/2016  Jessica Donovan 05/03/1946 [id number]  Subjective: Jessica Donovan is a 70yo who was referred to [place] for medication management. I received two missed calls from patient on 04/29/16 at 7:43 and 7:44 AM.  Returned phone call to patient.  Jessica Donovan reports she did not intentionally call me.  Informed Jessica Donovan that I had been trying to get in touch with her to follow up on setting up pill box fills through [employer].  Jessica Donovan confirms she has been in contact with Jessica Donovan and has successfully set up pill box fills through them.  Patient confirms first pill boxes were delivered this week and she is on day three of the first week of pill boxes.    Objective:   Encounter Medications: Outpatient Encounter Prescriptions as of 04/29/2016  Medication Sig Note  . Morphine Sulfate ER 30 MG TBEA Take 30 mg by mouth 3 (three) times daily.   [name] acetaminophen (TYLENOL) 500 MG tablet Take 1,000 mg by mouth every 4 (four) hours as needed for mild pain or headache.    . ALPRAZolam (XANAX) 0.25 MG tablet Take 1 tablet (0.25 mg total) by mouth 3 (three) times daily as needed for anxiety. (Patient taking differently: Take 1 mg by mouth 3 (three) times daily as needed for anxiety. )   . aspirin EC 81 MG tablet Take 81 mg by mouth daily.   [name] atorvastatin (LIPITOR) 20 MG tablet Take 20 mg by mouth daily.   . Biotin 5 MG CAPS Take 5 mg by mouth daily at 12 noon.    . cyclobenzaprine (FLEXERIL) 10 MG tablet Take 1 tablet (10 mg total) by mouth 3 (three) times daily as needed for muscle spasms.   . ferrous sulfate 325 (65 FE) MG tablet Take 325 mg by mouth daily with breakfast.   . fesoterodine (TOVIAZ) 8 MG TB24 tablet Take 8 mg by mouth daily.   . insulin aspart (NOVOLOG) 100 UNIT/ML injection Inject 3-15 Units into the skin 3 (three) times daily with meals as needed for high blood sugar. Pt uses as needed per  sliding scale:    Less than 140:  0 units  140-180:  3 units 181-220:  4 units 221- 260:  6 units 261- 320:  8 units 321-360:  10 units 361-400:  12 units Greater than 400:  15 units   . Insulin Glargine (TOUJEO SOLOSTAR) 300 UNIT/ML SOPN Inject 50 Units into the skin daily.   . isosorbide mononitrate (IMDUR) 30 MG 24 hr tablet Take 30 mg by mouth daily.    [name] lisinopril (PRINIVIL,ZESTRIL) 20 MG tablet Take 20 mg by mouth daily.   [name] loratadine (CLARITIN) 10 MG tablet Take 10 mg by mouth daily. Reported on 04/16/2016   . metoprolol (LOPRESSOR) 50 MG tablet Take 25 mg by mouth 2 (two) times daily.    . mirtazapine (REMERON) 15 MG tablet Take 15 mg by mouth at bedtime.   [name] morphine (MSIR) 15 MG tablet Take 15 mg by mouth every 6 (six) hours as needed for severe pain.   . Multiple Vitamin (MULTIVITAMIN WITH MINERALS) TABS tablet Take 1 tablet by mouth daily.   . mupirocin cream (BACTROBAN) 2 % Apply topically 2 (two) times daily.   . nitrofurantoin, macrocrystal-monohydrate, (MACROBID) 100 MG capsule Take 100 mg by mouth daily.   . nitroGLYCERIN (NITROSTAT) 0.4 MG SL tablet Place 0.4 mg under the tongue every  5 (five) minutes as needed for chest pain. Reported on 04/16/2016 04/16/2016: On hand if needed   . omeprazole (PRILOSEC) 20 MG capsule Take 20 mg by mouth daily.   . pregabalin (LYRICA) 150 MG capsule Take 150 mg by mouth 2 (two) times daily.   . ticagrelor (BRILINTA) 90 MG TABS tablet Take 90 mg by mouth 2 (two) times daily.   . traZODone (DESYREL) 50 MG tablet Take 1 tablet (50 mg total) by mouth at bedtime. 04/16/2016: Pt reports to start taking Trazadone 100 mg tonight,not sleeping.    . venlafaxine XR (EFFEXOR-XR) 150 MG 24 hr capsule Take 150 mg by mouth daily with breakfast.   . vitamin B-12 (CYANOCOBALAMIN) 1000 MCG tablet Take 1,000 mcg by mouth daily.   . Vitamin D, Ergocalciferol, (DRISDOL) 50000 units CAPS capsule Take 50,000 Units by mouth every 7 (seven) days. Pt takes on  Sunday.    No facility-administered encounter medications on file as of 04/29/2016.    Functional Status: In your present state of health, do you have any difficulty performing the following activities: 04/09/2016 04/03/2016  Hearing? N N  Vision? N N  Difficulty concentrating or making decisions? N N  Walking or climbing stairs? Y N  Dressing or bathing? N N  Doing errands, shopping? Y N  Preparing Food and eating ? - -  Using the Toilet? - -  In the past six months, have you accidently leaked urine? - -  Do you have problems with loss of bowel control? - -  Managing your Medications? - -  Managing your Finances? - -  Housekeeping or managing your Housekeeping? - -    Fall/Depression Screening: PHQ 2/9 Scores 03/09/2016 02/21/2016  PHQ - 2 Score 2 1  PHQ- 9 Score 4 6    Assessment: 1.  Medication management:  Patient has been successfully enrolled in pill box fill program through [employer].  Patient reports she wishes the pharmacy would provide her a description of what each medication looks like so she knows what each medication is.  Informed her that Jessica Donovan can provider her with that information, and advised patient to call [facility] to ask for that information.  Patient also confirms she went to [place], [place] to Jessica Donovan office and completed paperwork with nurse to get her bladder medications approved through her insurance.  Jessica Donovan confirms her nitrofurantoin is now covered by her insurance.  Patient denies any further pharmacy needs at this time.    2.  Medication adherence:  Patient reports adherence with medications.  Patient was recently hospitalized and the following medication changes were made:  morphine ER 30 mg TID was discontinued, trazodone was reduced to 50 mg daily at bedtime and alprazolam was reduced to 0.25 mg TID as needed.  Noted during PCP visit on 04/27/16, trazodone was increased to 100 mg daily.  In addition, patient reports she went to Dr.  Mamie Donovan office on 04/28/16, and patient was given prescriptions for morphine ER 30 mg TID, morphine IR four times daily as needed, and alprazolam 1 mg four times daily as needed.  I have counseled patient on risk of respiratory and CNS depression with the combined use of morphine and alprazolam and have explained the difference of scheduled ER morphine versus as needed IR morphine.    Plan: 1.  Counseled patient on importance of taking all medications as prescribed.   2.  Will close pharmacy program at this time as patient denies any further pharmacy needs.  [facility] CM Care Plan Problem One        Most Recent Value   Care Plan Problem One  Medication management   Role Documenting the Problem One  Clinical Pharmacist   Care Plan for Problem One  Active   [facility] CM Short Term Goal #1 (0-30 days)  Patient will transition prescription medications to [place] and will enroll in bi-weekly pill box fills through [facility] [place] in the next 30 days.    [facility] CM Short Term Goal #1 Start Date  04/21/16   [facility] CM Short Term Goal #1 Met Date  04/30/16   Interventions for Short Term Goal #1  Patient confirms she has been enrolled in [place] pill box fill program and received pill boxes from [place] this week.      [facility] CM Care Plan Problem Two        Most Recent Value   Care Plan Problem Two  Medication adherence   Role Documenting the Problem Two  Clinical Pharmacist   Care Plan for Problem Two  Active   [facility] CM Short Term Goal #1 (0-30 days)  Patient will take all medication as prescribed over the next 7 days per patient report.    [facility] CM Short Term Goal #1 Start Date  04/13/16   [facility] CM Short Term Goal #1 Met Date   04/30/16   Interventions for Short Term Goal #2   Counseled patient on importance of taking medications as prescribed.       Elisabeth Most, Pharm.D. Pharmacy Resident [place] [phone]

## 2016-05-02 DIAGNOSIS — M5441 Lumbago with sciatica, right side: Secondary | ICD-10-CM | POA: Diagnosis not present

## 2016-05-02 DIAGNOSIS — I10 Essential (primary) hypertension: Secondary | ICD-10-CM | POA: Diagnosis not present

## 2016-05-02 DIAGNOSIS — E1140 Type 2 diabetes mellitus with diabetic neuropathy, unspecified: Secondary | ICD-10-CM | POA: Diagnosis not present

## 2016-05-02 DIAGNOSIS — F419 Anxiety disorder, unspecified: Secondary | ICD-10-CM | POA: Diagnosis not present

## 2016-05-02 DIAGNOSIS — L89892 Pressure ulcer of other site, stage 2: Secondary | ICD-10-CM | POA: Diagnosis not present

## 2016-05-02 DIAGNOSIS — M6281 Muscle weakness (generalized): Secondary | ICD-10-CM | POA: Diagnosis not present

## 2016-05-02 DIAGNOSIS — L89510 Pressure ulcer of right ankle, unstageable: Secondary | ICD-10-CM | POA: Diagnosis not present

## 2016-05-02 DIAGNOSIS — I25119 Atherosclerotic heart disease of native coronary artery with unspecified angina pectoris: Secondary | ICD-10-CM | POA: Diagnosis not present

## 2016-05-02 DIAGNOSIS — M5442 Lumbago with sciatica, left side: Secondary | ICD-10-CM | POA: Diagnosis not present

## 2016-05-02 DIAGNOSIS — S91104D Unspecified open wound of right lesser toe(s) without damage to nail, subsequent encounter: Secondary | ICD-10-CM | POA: Diagnosis not present

## 2016-05-04 DIAGNOSIS — B351 Tinea unguium: Secondary | ICD-10-CM | POA: Diagnosis not present

## 2016-05-04 DIAGNOSIS — E1140 Type 2 diabetes mellitus with diabetic neuropathy, unspecified: Secondary | ICD-10-CM | POA: Diagnosis not present

## 2016-05-04 DIAGNOSIS — L97511 Non-pressure chronic ulcer of other part of right foot limited to breakdown of skin: Secondary | ICD-10-CM | POA: Diagnosis not present

## 2016-05-04 DIAGNOSIS — L851 Acquired keratosis [keratoderma] palmaris et plantaris: Secondary | ICD-10-CM | POA: Diagnosis not present

## 2016-05-06 ENCOUNTER — Other Ambulatory Visit: Payer: Self-pay | Admitting: *Deleted

## 2016-05-06 NOTE — Patient Outreach (Signed)
Transition of care call successful (week 4, discharged 4/21).  Pt reports received a good report on her toes on right foot from MD (Dr. Clide CliffKline), can just use a band aid now, to f/u again with MD in 6 weeks.  Pt reports [facility] if [facility], not sure if  coming back.  Pt reports she has not heard back from Dr. Dareen PianoAnderson about referral to f/u with specialist (cortizone shot to right knee, shoulder), plan to call MD today.  Pt reports no recent falls, pain better with medication adjustments.  Pt reports sugar today was 118, 83 yesterday, weights staying the same.   RN CM discussed with pt plan to f/u again telephonically 5/22 (final transition of care call).     [name] M.   Pierzchala RN CCM [facility] Care Management  [phone]

## 2016-05-07 DIAGNOSIS — E1140 Type 2 diabetes mellitus with diabetic neuropathy, unspecified: Secondary | ICD-10-CM | POA: Diagnosis not present

## 2016-05-07 DIAGNOSIS — M5442 Lumbago with sciatica, left side: Secondary | ICD-10-CM | POA: Diagnosis not present

## 2016-05-07 DIAGNOSIS — L89892 Pressure ulcer of other site, stage 2: Secondary | ICD-10-CM | POA: Diagnosis not present

## 2016-05-07 DIAGNOSIS — I10 Essential (primary) hypertension: Secondary | ICD-10-CM | POA: Diagnosis not present

## 2016-05-07 DIAGNOSIS — L89510 Pressure ulcer of right ankle, unstageable: Secondary | ICD-10-CM | POA: Diagnosis not present

## 2016-05-07 DIAGNOSIS — F419 Anxiety disorder, unspecified: Secondary | ICD-10-CM | POA: Diagnosis not present

## 2016-05-07 DIAGNOSIS — S91104D Unspecified open wound of right lesser toe(s) without damage to nail, subsequent encounter: Secondary | ICD-10-CM | POA: Diagnosis not present

## 2016-05-07 DIAGNOSIS — M5441 Lumbago with sciatica, right side: Secondary | ICD-10-CM | POA: Diagnosis not present

## 2016-05-07 DIAGNOSIS — M6281 Muscle weakness (generalized): Secondary | ICD-10-CM | POA: Diagnosis not present

## 2016-05-07 DIAGNOSIS — I25119 Atherosclerotic heart disease of native coronary artery with unspecified angina pectoris: Secondary | ICD-10-CM | POA: Diagnosis not present

## 2016-05-11 ENCOUNTER — Ambulatory Visit: Payer: Self-pay | Admitting: *Deleted

## 2016-05-11 ENCOUNTER — Other Ambulatory Visit: Payer: Self-pay | Admitting: *Deleted

## 2016-05-11 NOTE — Patient Outreach (Signed)
Attempt made to contact pt for final transition of care.  HIPPA compliant voice message left with contact name and number, if no response, will try again.    [name] M.   Pierzchala RN CCM [facility] Care Management  [phone]

## 2016-05-12 ENCOUNTER — Other Ambulatory Visit: Payer: Self-pay | Admitting: *Deleted

## 2016-05-12 NOTE — Patient Outreach (Signed)
Second attempt made to contact pt for final transition of care call.   HIPPA compliant voice message left with contact name and number, If no response, plan to call again a third time.   [name] M.   Pierzchala RN CCM [facility] Care [facility]  [phone]

## 2016-05-13 ENCOUNTER — Other Ambulatory Visit: Payer: Self-pay | Admitting: *Deleted

## 2016-05-13 ENCOUNTER — Ambulatory Visit: Payer: Self-pay | Admitting: *Deleted

## 2016-05-13 ENCOUNTER — Encounter: Payer: Self-pay | Admitting: *Deleted

## 2016-05-13 NOTE — Patient Outreach (Signed)
Spoke with [employer] [facility] social worker, reports talked with pt today so this RN CM made another call (final transition of care) which was successful.  Pt reports pain is being managed with medication, it is working out with having [facility] fill her pill boxes. Pt sounded  tired, speech slurred when talking with her, asked if recently took pain medication.  Pt reports she is tired, [facility] [employer] [facility], recently took something for pain.  Discussed with pt today is final transition of care call, plan to close case ( no further case management needs).     Plan to discharge pt from RN CM services, close case. Plan to send Dr. Dareen PianoAnderson case closure letter.   As discussed with pt, case closure letter to be mailed.   Plan to inform [facility] care management assistant to close case.   [name] M.   Pierzchala RN CCM [facility] Care Management  [phone]

## 2016-05-13 NOTE — Patient Outreach (Signed)
Third attempt made to contact pt for final transition of care call.  HIPPA compliant voice message left with contact name and number.  If no response, will send unable to contact letter- no response to letter in 10 business days, plan to close case, send case closure letter to Dr. Dareen PianoAnderson.   [name] M.   Mirza Kidney RN CCM [facility] Care [facility]  [phone]

## 2016-05-13 NOTE — Patient Outreach (Addendum)
[facility]) Care Management  05/13/2016  Mellody DanceBarbara Kruszka Sep 29, 1946 [id number]   Phone call to patient  to assess for continued social work needs.  Per patient, she has now working with [facility] and they are filling her pill box for her.  She is very satisfied with this services.  Patient has moved into a handicapped accessible apartment and states that her new apartment is much larger. Patient has declined the arrangement of personal care services at this time stating that she cannot afford to pay out of pocket for this service right now.  Patient continues to decline the option to move into an assisted living or another family care home. Stating that she cannot afford an assisted living and detailing a bad experience in a family care home. Patient [facility] the [facility] the [facility] for possible no cost in home assistance.  Contact information given.  Patient verbalized having no further social work needs. Patient's case to be closed at this time to social work at this time. RNCM notified   Adriana ReamsChrystal Josephina Melcher, LCSW [facility] Care Management [phone]

## 2016-05-22 DIAGNOSIS — E1140 Type 2 diabetes mellitus with diabetic neuropathy, unspecified: Secondary | ICD-10-CM | POA: Diagnosis not present

## 2016-05-22 DIAGNOSIS — M6281 Muscle weakness (generalized): Secondary | ICD-10-CM | POA: Diagnosis not present

## 2016-05-22 DIAGNOSIS — S91104D Unspecified open wound of right lesser toe(s) without damage to nail, subsequent encounter: Secondary | ICD-10-CM | POA: Diagnosis not present

## 2016-05-22 DIAGNOSIS — M5441 Lumbago with sciatica, right side: Secondary | ICD-10-CM | POA: Diagnosis not present

## 2016-05-22 DIAGNOSIS — F419 Anxiety disorder, unspecified: Secondary | ICD-10-CM | POA: Diagnosis not present

## 2016-05-22 DIAGNOSIS — I25119 Atherosclerotic heart disease of native coronary artery with unspecified angina pectoris: Secondary | ICD-10-CM | POA: Diagnosis not present

## 2016-05-22 DIAGNOSIS — M5442 Lumbago with sciatica, left side: Secondary | ICD-10-CM | POA: Diagnosis not present

## 2016-05-22 DIAGNOSIS — L89510 Pressure ulcer of right ankle, unstageable: Secondary | ICD-10-CM | POA: Diagnosis not present

## 2016-05-22 DIAGNOSIS — L89892 Pressure ulcer of other site, stage 2: Secondary | ICD-10-CM | POA: Diagnosis not present

## 2016-05-22 DIAGNOSIS — I10 Essential (primary) hypertension: Secondary | ICD-10-CM | POA: Diagnosis not present

## 2016-06-10 DIAGNOSIS — I2510 Atherosclerotic heart disease of native coronary artery without angina pectoris: Secondary | ICD-10-CM | POA: Diagnosis not present

## 2016-06-10 DIAGNOSIS — E1140 Type 2 diabetes mellitus with diabetic neuropathy, unspecified: Secondary | ICD-10-CM | POA: Diagnosis not present

## 2016-06-17 DIAGNOSIS — M2041 Other hammer toe(s) (acquired), right foot: Secondary | ICD-10-CM | POA: Diagnosis not present

## 2016-06-17 DIAGNOSIS — M5441 Lumbago with sciatica, right side: Secondary | ICD-10-CM | POA: Diagnosis not present

## 2016-06-17 DIAGNOSIS — M5442 Lumbago with sciatica, left side: Secondary | ICD-10-CM | POA: Diagnosis not present

## 2016-06-17 DIAGNOSIS — I2510 Atherosclerotic heart disease of native coronary artery without angina pectoris: Secondary | ICD-10-CM | POA: Diagnosis not present

## 2016-06-17 DIAGNOSIS — I10 Essential (primary) hypertension: Secondary | ICD-10-CM | POA: Diagnosis not present

## 2016-06-17 DIAGNOSIS — E1140 Type 2 diabetes mellitus with diabetic neuropathy, unspecified: Secondary | ICD-10-CM | POA: Diagnosis not present

## 2016-06-17 DIAGNOSIS — G8929 Other chronic pain: Secondary | ICD-10-CM | POA: Diagnosis not present

## 2016-06-17 DIAGNOSIS — L97511 Non-pressure chronic ulcer of other part of right foot limited to breakdown of skin: Secondary | ICD-10-CM | POA: Diagnosis not present

## 2016-07-16 ENCOUNTER — Ambulatory Visit: Payer: PPO

## 2016-07-17 DIAGNOSIS — H35073 Retinal telangiectasis, bilateral: Secondary | ICD-10-CM | POA: Diagnosis not present

## 2016-07-30 DIAGNOSIS — L97511 Non-pressure chronic ulcer of other part of right foot limited to breakdown of skin: Secondary | ICD-10-CM | POA: Diagnosis not present

## 2016-07-30 DIAGNOSIS — M2041 Other hammer toe(s) (acquired), right foot: Secondary | ICD-10-CM | POA: Diagnosis not present

## 2016-07-30 DIAGNOSIS — E1140 Type 2 diabetes mellitus with diabetic neuropathy, unspecified: Secondary | ICD-10-CM | POA: Diagnosis not present

## 2016-08-11 DIAGNOSIS — E1165 Type 2 diabetes mellitus with hyperglycemia: Secondary | ICD-10-CM | POA: Diagnosis not present

## 2016-08-11 DIAGNOSIS — G894 Chronic pain syndrome: Secondary | ICD-10-CM | POA: Diagnosis not present

## 2016-08-11 DIAGNOSIS — F411 Generalized anxiety disorder: Secondary | ICD-10-CM | POA: Diagnosis not present

## 2016-08-11 DIAGNOSIS — M5441 Lumbago with sciatica, right side: Secondary | ICD-10-CM | POA: Diagnosis not present

## 2016-08-25 DIAGNOSIS — I10 Essential (primary) hypertension: Secondary | ICD-10-CM | POA: Diagnosis not present

## 2016-08-25 DIAGNOSIS — R079 Chest pain, unspecified: Secondary | ICD-10-CM | POA: Diagnosis not present

## 2016-08-25 DIAGNOSIS — R55 Syncope and collapse: Secondary | ICD-10-CM | POA: Diagnosis not present

## 2016-08-25 DIAGNOSIS — R001 Bradycardia, unspecified: Secondary | ICD-10-CM | POA: Diagnosis not present

## 2016-08-25 DIAGNOSIS — E1140 Type 2 diabetes mellitus with diabetic neuropathy, unspecified: Secondary | ICD-10-CM | POA: Diagnosis not present

## 2016-08-25 DIAGNOSIS — Z9889 Other specified postprocedural states: Secondary | ICD-10-CM | POA: Diagnosis not present

## 2016-08-25 DIAGNOSIS — I2510 Atherosclerotic heart disease of native coronary artery without angina pectoris: Secondary | ICD-10-CM | POA: Diagnosis not present

## 2016-09-07 ENCOUNTER — Emergency Department: Payer: PPO

## 2016-09-07 ENCOUNTER — Encounter: Payer: Self-pay | Admitting: *Deleted

## 2016-09-07 ENCOUNTER — Other Ambulatory Visit: Payer: Self-pay

## 2016-09-07 ENCOUNTER — Emergency Department
Admission: EM | Admit: 2016-09-07 | Discharge: 2016-09-07 | Disposition: A | Discharge status: Home / Self Care | Payer: PPO | Attending: Student in an Organized Health Care Education/Training Program | Admitting: Student in an Organized Health Care Education/Training Program

## 2016-09-07 DIAGNOSIS — W19XXXA Unspecified fall, initial encounter: Secondary | ICD-10-CM

## 2016-09-07 DIAGNOSIS — Z7982 Long term (current) use of aspirin: Secondary | ICD-10-CM | POA: Diagnosis not present

## 2016-09-07 DIAGNOSIS — Z79899 Other long term (current) drug therapy: Secondary | ICD-10-CM | POA: Insufficient documentation

## 2016-09-07 DIAGNOSIS — Y9301 Activity, walking, marching and hiking: Secondary | ICD-10-CM | POA: Insufficient documentation

## 2016-09-07 DIAGNOSIS — I2510 Atherosclerotic heart disease of native coronary artery without angina pectoris: Secondary | ICD-10-CM | POA: Diagnosis not present

## 2016-09-07 DIAGNOSIS — Z9889 Other specified postprocedural states: Secondary | ICD-10-CM | POA: Diagnosis not present

## 2016-09-07 DIAGNOSIS — W1809XA Striking against other object with subsequent fall, initial encounter: Secondary | ICD-10-CM | POA: Insufficient documentation

## 2016-09-07 DIAGNOSIS — H538 Other visual disturbances: Secondary | ICD-10-CM | POA: Diagnosis not present

## 2016-09-07 DIAGNOSIS — I10 Essential (primary) hypertension: Secondary | ICD-10-CM | POA: Diagnosis not present

## 2016-09-07 DIAGNOSIS — R51 Headache: Secondary | ICD-10-CM | POA: Diagnosis not present

## 2016-09-07 DIAGNOSIS — R079 Chest pain, unspecified: Secondary | ICD-10-CM | POA: Diagnosis not present

## 2016-09-07 DIAGNOSIS — I25119 Atherosclerotic heart disease of native coronary artery with unspecified angina pectoris: Secondary | ICD-10-CM | POA: Diagnosis not present

## 2016-09-07 DIAGNOSIS — Y92002 Bathroom of unspecified non-institutional (private) residence single-family (private) house as the place of occurrence of the external cause: Secondary | ICD-10-CM | POA: Diagnosis not present

## 2016-09-07 DIAGNOSIS — E119 Type 2 diabetes mellitus without complications: Secondary | ICD-10-CM | POA: Diagnosis not present

## 2016-09-07 DIAGNOSIS — Y999 Unspecified external cause status: Secondary | ICD-10-CM | POA: Diagnosis not present

## 2016-09-07 DIAGNOSIS — I252 Old myocardial infarction: Secondary | ICD-10-CM | POA: Insufficient documentation

## 2016-09-07 DIAGNOSIS — Z794 Long term (current) use of insulin: Secondary | ICD-10-CM | POA: Diagnosis not present

## 2016-09-07 DIAGNOSIS — S0990XA Unspecified injury of head, initial encounter: Secondary | ICD-10-CM | POA: Diagnosis not present

## 2016-09-07 LAB — URINALYSIS COMPLETE WITH MICROSCOPIC (ARMC ONLY)
Bacteria, UA: NONE SEEN
Bilirubin Urine: NEGATIVE
Glucose, UA: NEGATIVE mg/dL
Hgb urine dipstick: NEGATIVE
Ketones, ur: NEGATIVE mg/dL
Nitrite: NEGATIVE
Protein, ur: NEGATIVE mg/dL
Specific Gravity, Urine: 1.009 (ref 1.005–1.030)
pH: 5 (ref 5.0–8.0)

## 2016-09-07 LAB — BASIC METABOLIC PANEL
Anion gap: 11 (ref 5–15)
BUN: 21 mg/dL — AB (ref 6–20)
CO2: 24 mmol/L (ref 22–32)
Calcium: 9.7 mg/dL (ref 8.9–10.3)
Chloride: 101 mmol/L (ref 101–111)
Creatinine, Ser: 0.95 mg/dL (ref 0.44–1.00)
GFR calc Af Amer: >60 mL/min (ref >60)
GFR, EST NON AFRICAN AMERICAN: 59 mL/min — AB (ref >60)
GLUCOSE: 123 mg/dL — AB (ref 65–99)
POTASSIUM: 4.2 mmol/L (ref 3.5–5.1)
Sodium: 136 mmol/L (ref 135–145)

## 2016-09-07 LAB — CBC
HCT: 40.5 % (ref 35.0–47.0)
Hemoglobin: 13.6 g/dL (ref 12.0–16.0)
MCH: 32.7 pg (ref 26.0–34.0)
MCHC: 33.5 g/dL (ref 32.0–36.0)
MCV: 97.7 fL (ref 80.0–100.0)
Platelets: 211 K/uL (ref 150–440)
RBC: 4.15 MIL/uL (ref 3.80–5.20)
RDW: 13.6 % (ref 11.5–14.5)
WBC: 6.8 K/uL (ref 3.6–11.0)

## 2016-09-07 LAB — TROPONIN I

## 2016-09-07 LAB — GLUCOSE, CAPILLARY: Glucose-Capillary: 117 mg/dL — ABNORMAL HIGH (ref 65–99)

## 2016-09-07 NOTE — Discharge Instructions (Signed)
Please follow-up with primary care physician. Return for any worsening symptoms or concerns.

## 2016-09-07 NOTE — ED Provider Notes (Signed)
[facility] Emergency Department Provider Note    First MD Initiated Contact with Patient 09/07/16 1656     (approximate)  I have reviewed the triage vital signs and the nursing notes.   HISTORY  Chief Complaint Fall    HPI Jessica DanceBarbara Donovan is a 70 y.o. female presents status post fall from standing at 3:30 this morning the patient was walking out of the bathroom.  She is on Brilinta for history of previous MI as well as a stroke.  Larey SeatFell and hit her head against the wall.  She is uncertain about loss of consciousness.  States she has a history of chronic back pain and is on chronic narcotics. Currently is complaining of a headache as well as blurry vision.  Patient was actually at her outpatient cardiologist at clinic today having a stress test performed. Talk to the nurse at the cardiology clinic about her fall last night and she was directed to the ER for further evaluation. She currently denies any other symptoms.   Past Medical History:  Diagnosis Date  . Chronic back pain   . Coronary artery disease   . Diabetes mellitus without complication (HCC)   . Hypertension   . Low back pain   . Myocardial infarction (HCC)   . Osteomyelitis of toe (HCC) 01/23/2016  . Stroke [facility](HCC)     Patient Active Problem List   Diagnosis Date Noted  . Cellulitis of fourth toe of right foot 04/10/2016  . Somnolence 04/09/2016  . Dementia 04/09/2016  . Cellulitis 04/03/2016  . Hematoma of right parietal scalp 02/23/2016  . Multiple falls 02/23/2016  . Physical debility 02/23/2016  . DM2 (diabetes mellitus, type 2) (HCC) 02/23/2016  . Syncope and collapse 02/23/2016  . Polypharmacy 02/23/2016  . Osteomyelitis of toe (HCC) 01/23/2016  . Rhabdomyolysis 12/07/2015  . ARF (acute renal failure) (HCC) 11/29/2015  . Hypotension 11/29/2015  . 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 Angina effort (HCC) 11/07/2015  . Osteomyelitis (HCC) 11/07/2015    Past Surgical History:   Procedure Laterality Date  . AMPUTATION TOE Left 11/10/2015   Procedure: AMPUTATION TOE;  Surgeon: Linus Galasodd Cline, MD;  Location: [facility] ORS;  Service: Podiatry;  Laterality: Left;  . AMPUTATION TOE Left 01/24/2016   Procedure: AMPUTATION TOE (2nd mpj);  Surgeon: Linus Galasodd Cline, DPM;  Location: [facility] ORS;  Service: Podiatry;  Laterality: Left;  . CARDIAC CATHETERIZATION N/A 11/12/2015   Procedure: Left Heart Cath and Coronary Angiography;  Surgeon: Marcina MillardAlexander Paraschos, MD;  Location: [facility] INVASIVE CV LAB;  Service: Cardiovascular;  Laterality: N/A;  . TOE AMPUTATION      Prior to Admission medications   Medication Sig Start Date End Date Taking? Authorizing Provider  acetaminophen (TYLENOL) 500 MG tablet Take 1,000 mg by mouth every 4 (four) hours as needed for mild pain or headache.     Historical Provider, MD  ALPRAZolam Prudy Feeler(XANAX) 0.25 MG tablet Take 1 tablet (0.25 mg total) by mouth 3 (three) times daily as needed for anxiety. Patient taking differently: Take 1 mg by mouth 3 (three) times daily as needed for anxiety.  04/10/16   Katharina Caperima Vaickute, MD  aspirin EC 81 MG tablet Take 81 mg by mouth daily.    Historical Provider, MD  atorvastatin (LIPITOR) 20 MG tablet Take 20 mg by mouth daily.    Historical Provider, MD  Biotin 5 MG CAPS Take 5 mg by mouth daily at 12 noon.     Historical Provider, MD  cyclobenzaprine (FLEXERIL) 10 MG  tablet Take 1 tablet (10 mg total) by mouth 3 (three) times daily as needed for muscle spasms. 01/27/16   Enid Baas, MD  ferrous sulfate 325 (65 FE) MG tablet Take 325 mg by mouth daily with breakfast.    Historical Provider, MD  fesoterodine (TOVIAZ) 8 MG TB24 tablet Take 8 mg by mouth daily.    Historical Provider, MD  insulin aspart (NOVOLOG) 100 UNIT/ML injection Inject 3-15 Units into the skin 3 (three) times daily with meals as needed for high blood sugar. Pt uses as needed per sliding scale:    Less than 140:  0 units  140-180:  3 units 181-220:  4 units 221- 260:   6 units 261- 320:  8 units 321-360:  10 units 361-400:  12 units Greater than 400:  15 units    Historical Provider, MD  Insulin Glargine (TOUJEO SOLOSTAR) 300 UNIT/ML SOPN Inject 50 Units into the skin daily.    Historical Provider, MD  isosorbide mononitrate (IMDUR) 30 MG 24 hr tablet Take 30 mg by mouth daily.     Historical Provider, MD  lisinopril (PRINIVIL,ZESTRIL) 20 MG tablet Take 20 mg by mouth daily.    Historical Provider, MD  loratadine (CLARITIN) 10 MG tablet Take 10 mg by mouth daily. Reported on 04/16/2016    Historical Provider, MD  metoprolol (LOPRESSOR) 50 MG tablet Take 25 mg by mouth 2 (two) times daily.     Historical Provider, MD  mirtazapine (REMERON) 15 MG tablet Take 15 mg by mouth at bedtime.    Historical Provider, MD  morphine (MSIR) 15 MG tablet Take 15 mg by mouth every 6 (six) hours as needed for severe pain.    Historical Provider, MD  Morphine Sulfate ER 30 MG TBEA Take 30 mg by mouth 3 (three) times daily.    Historical Provider, MD  Multiple Vitamin (MULTIVITAMIN WITH MINERALS) TABS tablet Take 1 tablet by mouth daily.    Historical Provider, MD  mupirocin cream (BACTROBAN) 2 % Apply topically 2 (two) times daily. 04/10/16   Katharina Caper, MD  nitrofurantoin, macrocrystal-monohydrate, (MACROBID) 100 MG capsule Take 100 mg by mouth daily.    Historical Provider, MD  nitroGLYCERIN (NITROSTAT) 0.4 MG SL tablet Place 0.4 mg under the tongue every 5 (five) minutes as needed for chest pain. Reported on 04/16/2016    Historical Provider, MD  omeprazole (PRILOSEC) 20 MG capsule Take 20 mg by mouth daily.    Historical Provider, MD  pregabalin (LYRICA) 150 MG capsule Take 150 mg by mouth 2 (two) times daily.    Historical Provider, MD  ticagrelor (BRILINTA) 90 MG TABS tablet Take 90 mg by mouth 2 (two) times daily.    Historical Provider, MD  traZODone (DESYREL) 50 MG tablet Take 1 tablet (50 mg total) by mouth at bedtime. 04/10/16   Katharina Caper, MD  venlafaxine XR  (EFFEXOR-XR) 150 MG 24 hr capsule Take 150 mg by mouth daily with breakfast.    Historical Provider, MD  vitamin B-12 (CYANOCOBALAMIN) 1000 MCG tablet Take 1,000 mcg by mouth daily.    Historical Provider, MD  Vitamin D, Ergocalciferol, (DRISDOL) 50000 units CAPS capsule Take 50,000 Units by mouth every 7 (seven) days. Pt takes on Sunday.    Historical Provider, MD    Allergies Codeine; Influenza vaccines; Methadone; Percocet [oxycodone-acetaminophen]; Tetanus toxoids; and Tetracyclines & related  Family History  Problem Relation Age of Onset  . CAD Mother   . CAD Father     Social History Social  History  Substance Use Topics  . Smoking status: Never Smoker  . Smokeless tobacco: Not on file  . Alcohol use No    Review of Systems Patient denies headaches, rhinorrhea, blurry vision, numbness, shortness of breath, chest pain, edema, cough, abdominal pain, nausea, vomiting, diarrhea, dysuria, fevers, rashes or hallucinations unless otherwise stated above in HPI. ____________________________________________   PHYSICAL EXAM:  VITAL SIGNS: Vitals:   09/07/16 1622  BP: 132/74  Pulse: 87  Resp: 18  Temp: 98.6 F (37 C)    Constitutional: Alert and oriented. Well appearing and in no acute distress. Eyes: Conjunctivae are normal. PERRL. EOMI. Head: Atraumatic. Nose: No congestion/rhinnorhea. Mouth/Throat: Mucous membranes are moist.  Oropharynx non-erythematous. Neck: No stridor. Painless ROM. No cervical spine tenderness to palpation Hematological/Lymphatic/Immunilogical: No cervical lymphadenopathy. Cardiovascular: Normal rate, regular rhythm. Grossly normal heart sounds.  Good peripheral circulation. Respiratory: Normal respiratory effort.  No retractions. Lungs CTAB. Gastrointestinal: Soft and nontender. No distention. No abdominal bruits. No CVA tenderness. Genitourinary:  Musculoskeletal: No lower extremity tenderness nor edema.  No joint effusions. Neurologic:  CN-  intact.  No facial droop, Normal FNF.  Normal heel to shin.  Sensation intact bilaterally. Normal speech and language. No gross focal neurologic deficits are appreciated. No gait instability. Skin:  Skin is warm, dry and intact. No rash noted. Psychiatric: Mood and affect are normal. Speech and behavior are normal.  ____________________________________________   LABS (all labs ordered are listed, but only abnormal results are displayed)  Results for orders placed or performed during the hospital encounter of 09/07/16 (from the past 24 hour(s))  CBC     Status: None   Collection Time: 09/07/16  5:36 PM  Result Value Ref Range   WBC 6.8 3.6 - 11.0 K/uL   RBC 4.15 3.80 - 5.20 MIL/uL   Hemoglobin 13.6 12.0 - 16.0 g/dL   HCT 16.1 09.6 - 04.5 %   MCV 97.7 80.0 - 100.0 fL   MCH 32.7 26.0 - 34.0 pg   MCHC 33.5 32.0 - 36.0 g/dL   RDW 40.9 81.1 - 91.4 %   Platelets 211 150 - 440 K/uL  Glucose, capillary     Status: Abnormal   Collection Time: 09/07/16  6:04 PM  Result Value Ref Range   Glucose-Capillary 117 (H) 65 - 99 mg/dL   ____________________________________________  EKG My review and personal interpretation at Time: 16:30   Indication: syncope  Rate: 85  Rhythm: nsr Axis: left Other: poor r wave progression, no acute ischemia ____________________________________________  RADIOLOGY  CT head with NAICA ____________________________________________   PROCEDURES  Procedure(s) performed: none    Critical Care performed: no ____________________________________________   INITIAL IMPRESSION / ASSESSMENT AND PLAN / ED COURSE  Pertinent labs & imaging results that were available during my care of the patient were reviewed by me and considered in my medical decision making (see chart for details).  DDX: cva, sdh, sah, iph, concussion,polypharmacy,    Jessica Donovan is a 70 y.o. who presents to the ED with weakness and fall.  Patient is AFVSS in ED. Exam as above. Given  current presentation have considered the above differential. T imaging of the head ordered as the patient is with head injury and on anticoagulation. CT imaging does not show evidence of acute traumatic injury. She has no focal neurodeficits. She denies any chest pain or shortness of breath.  The patient will be placed on continuous pulse oximetry and telemetry for monitoring.  Laboratory evaluation will be sent to evaluate for  the above complaints.      Clinical Course  Comment By Time  Laboratory evaluation unremarkable. I'm unable to find results of a reported stress test the medical record. Patient denies any chest pain at this time Willy Eddy, MD 09/18 [phone]  Spoke with Dr. Lady Gary who reports no significant abnormality apparent on lexiscan but it has not been formally read. Willy Eddy, MD 09/18 [phone]  Patient able to ambulate with a steady gait. Demonstrates no focal neurodeficits.  Patient was observed in the ER for 4 hours without any dysrhythmia, chest pain or shortness of breath.  PAtient stable for outpatient follow up. Willy Eddy, MD 09/18 2024     ____________________________________________   FINAL CLINICAL IMPRESSION(S) / ED DIAGNOSES  Final diagnoses:  Fall, initial encounter      NEW MEDICATIONS STARTED DURING THIS VISIT:  New Prescriptions   No medications on file     Note:  This document was prepared using Dragon voice recognition software and may include unintentional dictation errors.    Willy Eddy, MD 09/08/16 [phone]

## 2016-09-07 NOTE — ED Triage Notes (Addendum)
States she fell this Am while trying to use the bathroom at 0330, pt on brillenta, pt takes morphine daily from chronic back pain, pt awake and alert, states blurry vision and headache at present, states she feels as if her balance is off

## 2016-09-07 NOTE — ED Notes (Signed)
Pt ambulated to the toliet using walker no dizziness while walking. Pt given a phone to use and sandwich tray with diet cola. No distress noted.

## 2016-09-07 NOTE — ED Notes (Signed)
Pt in CT.

## 2016-09-07 NOTE — ED Notes (Signed)
Pt stating that she had a fall this morning. Pt stating that she did hit her head during the fall. Pt's speech is mildly slurred. Pt is aaox4. Pt is strong bilaterally with push/pulls and grips. Pt was placed on monitor. Pt answering questions appropriately.

## 2016-09-08 DIAGNOSIS — R001 Bradycardia, unspecified: Secondary | ICD-10-CM | POA: Diagnosis not present

## 2016-09-10 DIAGNOSIS — R079 Chest pain, unspecified: Secondary | ICD-10-CM | POA: Diagnosis not present

## 2016-09-10 DIAGNOSIS — B351 Tinea unguium: Secondary | ICD-10-CM | POA: Diagnosis not present

## 2016-09-10 DIAGNOSIS — I10 Essential (primary) hypertension: Secondary | ICD-10-CM | POA: Diagnosis not present

## 2016-09-10 DIAGNOSIS — M2041 Other hammer toe(s) (acquired), right foot: Secondary | ICD-10-CM | POA: Diagnosis not present

## 2016-09-10 DIAGNOSIS — L97511 Non-pressure chronic ulcer of other part of right foot limited to breakdown of skin: Secondary | ICD-10-CM | POA: Diagnosis not present

## 2016-09-10 DIAGNOSIS — E1140 Type 2 diabetes mellitus with diabetic neuropathy, unspecified: Secondary | ICD-10-CM | POA: Diagnosis not present

## 2016-09-10 DIAGNOSIS — I2510 Atherosclerotic heart disease of native coronary artery without angina pectoris: Secondary | ICD-10-CM | POA: Diagnosis not present

## 2016-09-10 DIAGNOSIS — Z9889 Other specified postprocedural states: Secondary | ICD-10-CM | POA: Diagnosis not present

## 2016-09-10 DIAGNOSIS — R55 Syncope and collapse: Secondary | ICD-10-CM | POA: Diagnosis not present

## 2016-10-13 DIAGNOSIS — I2510 Atherosclerotic heart disease of native coronary artery without angina pectoris: Secondary | ICD-10-CM | POA: Diagnosis not present

## 2016-10-13 DIAGNOSIS — M5137 Other intervertebral disc degeneration, lumbosacral region: Secondary | ICD-10-CM | POA: Diagnosis not present

## 2016-10-13 DIAGNOSIS — M545 Low back pain: Secondary | ICD-10-CM | POA: Diagnosis not present

## 2016-10-13 DIAGNOSIS — I252 Old myocardial infarction: Secondary | ICD-10-CM | POA: Diagnosis not present

## 2016-10-21 DIAGNOSIS — M5432 Sciatica, left side: Secondary | ICD-10-CM | POA: Diagnosis not present

## 2016-10-21 DIAGNOSIS — I2510 Atherosclerotic heart disease of native coronary artery without angina pectoris: Secondary | ICD-10-CM | POA: Diagnosis not present

## 2016-10-21 DIAGNOSIS — E1140 Type 2 diabetes mellitus with diabetic neuropathy, unspecified: Secondary | ICD-10-CM | POA: Diagnosis not present

## 2016-10-21 DIAGNOSIS — M25552 Pain in left hip: Secondary | ICD-10-CM | POA: Diagnosis not present

## 2016-10-21 DIAGNOSIS — M1612 Unilateral primary osteoarthritis, left hip: Secondary | ICD-10-CM | POA: Diagnosis not present

## 2016-10-26 DIAGNOSIS — G8929 Other chronic pain: Secondary | ICD-10-CM | POA: Diagnosis not present

## 2016-10-26 DIAGNOSIS — E1140 Type 2 diabetes mellitus with diabetic neuropathy, unspecified: Secondary | ICD-10-CM | POA: Diagnosis not present

## 2016-10-26 DIAGNOSIS — M5432 Sciatica, left side: Secondary | ICD-10-CM | POA: Diagnosis not present

## 2016-10-27 DIAGNOSIS — G8929 Other chronic pain: Secondary | ICD-10-CM | POA: Diagnosis not present

## 2016-10-27 DIAGNOSIS — E1140 Type 2 diabetes mellitus with diabetic neuropathy, unspecified: Secondary | ICD-10-CM | POA: Diagnosis not present

## 2016-10-27 DIAGNOSIS — M5432 Sciatica, left side: Secondary | ICD-10-CM | POA: Diagnosis not present

## 2016-11-27 DIAGNOSIS — B351 Tinea unguium: Secondary | ICD-10-CM | POA: Diagnosis not present

## 2016-11-27 DIAGNOSIS — G8929 Other chronic pain: Secondary | ICD-10-CM | POA: Diagnosis not present

## 2016-11-27 DIAGNOSIS — E1140 Type 2 diabetes mellitus with diabetic neuropathy, unspecified: Secondary | ICD-10-CM | POA: Diagnosis not present

## 2016-11-27 DIAGNOSIS — M549 Dorsalgia, unspecified: Secondary | ICD-10-CM | POA: Diagnosis not present

## 2016-11-27 DIAGNOSIS — L851 Acquired keratosis [keratoderma] palmaris et plantaris: Secondary | ICD-10-CM | POA: Diagnosis not present

## 2017-01-16 DIAGNOSIS — Z881 Allergy status to other antibiotic agents status: Secondary | ICD-10-CM | POA: Diagnosis not present

## 2017-01-16 DIAGNOSIS — K529 Noninfective gastroenteritis and colitis, unspecified: Secondary | ICD-10-CM | POA: Diagnosis not present

## 2017-01-16 DIAGNOSIS — A419 Sepsis, unspecified organism: Secondary | ICD-10-CM | POA: Diagnosis not present

## 2017-01-16 DIAGNOSIS — Z8673 Personal history of transient ischemic attack (TIA), and cerebral infarction without residual deficits: Secondary | ICD-10-CM | POA: Diagnosis not present

## 2017-01-16 DIAGNOSIS — E860 Dehydration: Secondary | ICD-10-CM | POA: Diagnosis present

## 2017-01-16 DIAGNOSIS — R400 Somnolence: Secondary | ICD-10-CM | POA: Diagnosis not present

## 2017-01-16 DIAGNOSIS — R296 Repeated falls: Secondary | ICD-10-CM | POA: Diagnosis not present

## 2017-01-16 DIAGNOSIS — I10 Essential (primary) hypertension: Secondary | ICD-10-CM | POA: Diagnosis not present

## 2017-01-16 DIAGNOSIS — N390 Urinary tract infection, site not specified: Secondary | ICD-10-CM | POA: Diagnosis not present

## 2017-01-16 DIAGNOSIS — S0083XA Contusion of other part of head, initial encounter: Secondary | ICD-10-CM | POA: Diagnosis present

## 2017-01-16 DIAGNOSIS — Z89422 Acquired absence of other left toe(s): Secondary | ICD-10-CM

## 2017-01-16 DIAGNOSIS — T40605A Adverse effect of unspecified narcotics, initial encounter: Secondary | ICD-10-CM | POA: Diagnosis not present

## 2017-01-16 DIAGNOSIS — E785 Hyperlipidemia, unspecified: Secondary | ICD-10-CM | POA: Diagnosis present

## 2017-01-16 DIAGNOSIS — E119 Type 2 diabetes mellitus without complications: Secondary | ICD-10-CM | POA: Diagnosis present

## 2017-01-16 DIAGNOSIS — S0993XA Unspecified injury of face, initial encounter: Secondary | ICD-10-CM | POA: Diagnosis not present

## 2017-01-16 DIAGNOSIS — F0390 Unspecified dementia without behavioral disturbance: Secondary | ICD-10-CM | POA: Diagnosis present

## 2017-01-16 DIAGNOSIS — R32 Unspecified urinary incontinence: Secondary | ICD-10-CM | POA: Diagnosis present

## 2017-01-16 DIAGNOSIS — W19XXXA Unspecified fall, initial encounter: Secondary | ICD-10-CM | POA: Diagnosis not present

## 2017-01-16 DIAGNOSIS — G92 Toxic encephalopathy: Secondary | ICD-10-CM | POA: Diagnosis not present

## 2017-01-16 DIAGNOSIS — M545 Low back pain: Secondary | ICD-10-CM | POA: Diagnosis present

## 2017-01-16 DIAGNOSIS — Z79899 Other long term (current) drug therapy: Secondary | ICD-10-CM

## 2017-01-16 DIAGNOSIS — I252 Old myocardial infarction: Secondary | ICD-10-CM

## 2017-01-16 DIAGNOSIS — Z7982 Long term (current) use of aspirin: Secondary | ICD-10-CM

## 2017-01-16 DIAGNOSIS — G9349 Other encephalopathy: Secondary | ICD-10-CM | POA: Diagnosis not present

## 2017-01-16 DIAGNOSIS — Z885 Allergy status to narcotic agent status: Secondary | ICD-10-CM

## 2017-01-16 DIAGNOSIS — I2510 Atherosclerotic heart disease of native coronary artery without angina pectoris: Secondary | ICD-10-CM | POA: Diagnosis present

## 2017-01-16 DIAGNOSIS — G8929 Other chronic pain: Secondary | ICD-10-CM | POA: Diagnosis present

## 2017-01-16 DIAGNOSIS — Z887 Allergy status to serum and vaccine status: Secondary | ICD-10-CM

## 2017-01-16 DIAGNOSIS — Z66 Do not resuscitate: Secondary | ICD-10-CM | POA: Diagnosis not present

## 2017-01-16 DIAGNOSIS — R531 Weakness: Secondary | ICD-10-CM | POA: Diagnosis not present

## 2017-01-16 DIAGNOSIS — Z794 Long term (current) use of insulin: Secondary | ICD-10-CM

## 2017-01-16 DIAGNOSIS — Z8249 Family history of ischemic heart disease and other diseases of the circulatory system: Secondary | ICD-10-CM

## 2017-01-16 DIAGNOSIS — Y92009 Unspecified place in unspecified non-institutional (private) residence as the place of occurrence of the external cause: Secondary | ICD-10-CM

## 2017-01-17 ENCOUNTER — Emergency Department: Payer: PPO

## 2017-01-17 ENCOUNTER — Inpatient Hospital Stay
Admission: EM | Admit: 2017-01-17 | Discharge: 2017-01-19 | DRG: 871 | Disposition: A | Discharge status: Skilled Nursing Facility | Payer: PPO | Attending: Internal Medicine | Admitting: Internal Medicine

## 2017-01-17 DIAGNOSIS — Z881 Allergy status to other antibiotic agents status: Secondary | ICD-10-CM | POA: Diagnosis not present

## 2017-01-17 DIAGNOSIS — S0990XA Unspecified injury of head, initial encounter: Secondary | ICD-10-CM | POA: Diagnosis not present

## 2017-01-17 DIAGNOSIS — A419 Sepsis, unspecified organism: Secondary | ICD-10-CM | POA: Diagnosis present

## 2017-01-17 DIAGNOSIS — Y92009 Unspecified place in unspecified non-institutional (private) residence as the place of occurrence of the external cause: Secondary | ICD-10-CM | POA: Diagnosis not present

## 2017-01-17 DIAGNOSIS — Z89422 Acquired absence of other left toe(s): Secondary | ICD-10-CM | POA: Diagnosis not present

## 2017-01-17 DIAGNOSIS — G9349 Other encephalopathy: Secondary | ICD-10-CM | POA: Diagnosis not present

## 2017-01-17 DIAGNOSIS — W19XXXA Unspecified fall, initial encounter: Secondary | ICD-10-CM | POA: Diagnosis not present

## 2017-01-17 DIAGNOSIS — I2510 Atherosclerotic heart disease of native coronary artery without angina pectoris: Secondary | ICD-10-CM | POA: Diagnosis not present

## 2017-01-17 DIAGNOSIS — K529 Noninfective gastroenteritis and colitis, unspecified: Secondary | ICD-10-CM | POA: Diagnosis present

## 2017-01-17 DIAGNOSIS — M62838 Other muscle spasm: Secondary | ICD-10-CM | POA: Diagnosis not present

## 2017-01-17 DIAGNOSIS — F0390 Unspecified dementia without behavioral disturbance: Secondary | ICD-10-CM | POA: Diagnosis not present

## 2017-01-17 DIAGNOSIS — M549 Dorsalgia, unspecified: Secondary | ICD-10-CM | POA: Diagnosis not present

## 2017-01-17 DIAGNOSIS — G92 Toxic encephalopathy: Secondary | ICD-10-CM | POA: Diagnosis not present

## 2017-01-17 DIAGNOSIS — I10 Essential (primary) hypertension: Secondary | ICD-10-CM | POA: Diagnosis not present

## 2017-01-17 DIAGNOSIS — R197 Diarrhea, unspecified: Secondary | ICD-10-CM | POA: Diagnosis not present

## 2017-01-17 DIAGNOSIS — T40605A Adverse effect of unspecified narcotics, initial encounter: Secondary | ICD-10-CM | POA: Diagnosis not present

## 2017-01-17 DIAGNOSIS — S0083XA Contusion of other part of head, initial encounter: Secondary | ICD-10-CM

## 2017-01-17 DIAGNOSIS — J9811 Atelectasis: Secondary | ICD-10-CM | POA: Diagnosis not present

## 2017-01-17 DIAGNOSIS — Z885 Allergy status to narcotic agent status: Secondary | ICD-10-CM | POA: Diagnosis not present

## 2017-01-17 DIAGNOSIS — R32 Unspecified urinary incontinence: Secondary | ICD-10-CM | POA: Diagnosis not present

## 2017-01-17 DIAGNOSIS — R109 Unspecified abdominal pain: Secondary | ICD-10-CM

## 2017-01-17 DIAGNOSIS — F329 Major depressive disorder, single episode, unspecified: Secondary | ICD-10-CM | POA: Diagnosis not present

## 2017-01-17 DIAGNOSIS — M545 Low back pain: Secondary | ICD-10-CM | POA: Diagnosis not present

## 2017-01-17 DIAGNOSIS — E785 Hyperlipidemia, unspecified: Secondary | ICD-10-CM | POA: Diagnosis not present

## 2017-01-17 DIAGNOSIS — R262 Difficulty in walking, not elsewhere classified: Secondary | ICD-10-CM

## 2017-01-17 DIAGNOSIS — Z66 Do not resuscitate: Secondary | ICD-10-CM | POA: Diagnosis not present

## 2017-01-17 DIAGNOSIS — S0990XD Unspecified injury of head, subsequent encounter: Secondary | ICD-10-CM | POA: Diagnosis not present

## 2017-01-17 DIAGNOSIS — Z794 Long term (current) use of insulin: Secondary | ICD-10-CM | POA: Diagnosis not present

## 2017-01-17 DIAGNOSIS — R296 Repeated falls: Secondary | ICD-10-CM | POA: Diagnosis not present

## 2017-01-17 DIAGNOSIS — S199XXA Unspecified injury of neck, initial encounter: Secondary | ICD-10-CM | POA: Diagnosis not present

## 2017-01-17 DIAGNOSIS — I252 Old myocardial infarction: Secondary | ICD-10-CM | POA: Diagnosis not present

## 2017-01-17 DIAGNOSIS — Z8673 Personal history of transient ischemic attack (TIA), and cerebral infarction without residual deficits: Secondary | ICD-10-CM | POA: Diagnosis not present

## 2017-01-17 DIAGNOSIS — M6281 Muscle weakness (generalized): Secondary | ICD-10-CM

## 2017-01-17 DIAGNOSIS — N390 Urinary tract infection, site not specified: Secondary | ICD-10-CM | POA: Diagnosis not present

## 2017-01-17 DIAGNOSIS — Z887 Allergy status to serum and vaccine status: Secondary | ICD-10-CM | POA: Diagnosis not present

## 2017-01-17 DIAGNOSIS — E860 Dehydration: Secondary | ICD-10-CM

## 2017-01-17 DIAGNOSIS — E039 Hypothyroidism, unspecified: Secondary | ICD-10-CM | POA: Diagnosis not present

## 2017-01-17 DIAGNOSIS — Z7982 Long term (current) use of aspirin: Secondary | ICD-10-CM | POA: Diagnosis not present

## 2017-01-17 DIAGNOSIS — W19XXXD Unspecified fall, subsequent encounter: Secondary | ICD-10-CM | POA: Diagnosis not present

## 2017-01-17 DIAGNOSIS — E119 Type 2 diabetes mellitus without complications: Secondary | ICD-10-CM | POA: Diagnosis not present

## 2017-01-17 DIAGNOSIS — R400 Somnolence: Secondary | ICD-10-CM | POA: Diagnosis not present

## 2017-01-17 DIAGNOSIS — G8929 Other chronic pain: Secondary | ICD-10-CM | POA: Diagnosis not present

## 2017-01-17 DIAGNOSIS — Z7401 Bed confinement status: Secondary | ICD-10-CM | POA: Diagnosis not present

## 2017-01-17 HISTORY — DX: Systemic involvement of connective tissue, unspecified: M35.9

## 2017-01-17 LAB — CBC WITH DIFFERENTIAL/PLATELET
BASOS PCT: 0 %
Basophils Absolute: 0 10*3/uL (ref 0–0.1)
Eosinophils Absolute: 0 10*3/uL (ref 0–0.7)
Eosinophils Relative: 0 %
HEMATOCRIT: 34 % — AB (ref 35.0–47.0)
HEMOGLOBIN: 11.8 g/dL — AB (ref 12.0–16.0)
LYMPHS PCT: 7 %
Lymphs Abs: 1 10*3/uL (ref 1.0–3.6)
MCH: 34.1 pg — AB (ref 26.0–34.0)
MCHC: 34.8 g/dL (ref 32.0–36.0)
MCV: 97.9 fL (ref 80.0–100.0)
Monocytes Absolute: 1.3 10*3/uL — ABNORMAL HIGH (ref 0.2–0.9)
Monocytes Relative: 9 %
NEUTROS ABS: 12.2 10*3/uL — AB (ref 1.4–6.5)
NEUTROS PCT: 84 %
Platelets: 273 10*3/uL (ref 150–440)
RBC: 3.47 MIL/uL — ABNORMAL LOW (ref 3.80–5.20)
RDW: 15.1 % — ABNORMAL HIGH (ref 11.5–14.5)
WBC: 14.5 10*3/uL — ABNORMAL HIGH (ref 3.6–11.0)

## 2017-01-17 LAB — URINALYSIS, COMPLETE (UACMP) WITH MICROSCOPIC
BILIRUBIN URINE: NEGATIVE
Bacteria, UA: NONE SEEN
GLUCOSE, UA: NEGATIVE mg/dL
HGB URINE DIPSTICK: NEGATIVE
Ketones, ur: 20 mg/dL — AB
Nitrite: NEGATIVE
PH: 5 (ref 5.0–8.0)
Protein, ur: NEGATIVE mg/dL
SPECIFIC GRAVITY, URINE: 1.02 (ref 1.005–1.030)

## 2017-01-17 LAB — TSH: TSH: 0.463 u[IU]/mL (ref 0.350–4.500)

## 2017-01-17 LAB — BASIC METABOLIC PANEL
ANION GAP: 7 (ref 5–15)
Anion gap: 10 (ref 5–15)
BUN: 21 mg/dL — ABNORMAL HIGH (ref 6–20)
BUN: 28 mg/dL — AB (ref 6–20)
CALCIUM: 8.2 mg/dL — AB (ref 8.9–10.3)
CHLORIDE: 101 mmol/L (ref 101–111)
CO2: 23 mmol/L (ref 22–32)
CO2: 23 mmol/L (ref 22–32)
CREATININE: 0.79 mg/dL (ref 0.44–1.00)
Calcium: 9.2 mg/dL (ref 8.9–10.3)
Chloride: 102 mmol/L (ref 101–111)
Creatinine, Ser: 1.31 mg/dL — ABNORMAL HIGH (ref 0.44–1.00)
GFR calc non Af Amer: 40 mL/min — ABNORMAL LOW (ref >60)
GFR, EST AFRICAN AMERICAN: 47 mL/min — AB (ref >60)
Glucose, Bld: 164 mg/dL — ABNORMAL HIGH (ref 65–99)
Glucose, Bld: 204 mg/dL — ABNORMAL HIGH (ref 65–99)
POTASSIUM: 4.7 mmol/L (ref 3.5–5.1)
Potassium: 4 mmol/L (ref 3.5–5.1)
SODIUM: 132 mmol/L — AB (ref 135–145)
SODIUM: 134 mmol/L — AB (ref 135–145)

## 2017-01-17 LAB — GLUCOSE, CAPILLARY
Glucose-Capillary: 141 mg/dL — ABNORMAL HIGH (ref 65–99)
Glucose-Capillary: 200 mg/dL — ABNORMAL HIGH (ref 65–99)
Glucose-Capillary: 229 mg/dL — ABNORMAL HIGH (ref 65–99)
Glucose-Capillary: 154 mg/dL — ABNORMAL HIGH (ref 65–99)
Glucose-Capillary: 149 mg/dL — ABNORMAL HIGH (ref 65–99)

## 2017-01-17 LAB — PROTIME-INR
INR: 1.03
Prothrombin Time: 13.5 seconds (ref 11.4–15.2)

## 2017-01-17 LAB — CBC
HCT: 31.8 % — ABNORMAL LOW (ref 35.0–47.0)
HEMOGLOBIN: 10.8 g/dL — AB (ref 12.0–16.0)
MCH: 33.8 pg (ref 26.0–34.0)
MCHC: 34 g/dL (ref 32.0–36.0)
MCV: 99.4 fL (ref 80.0–100.0)
PLATELETS: 217 10*3/uL (ref 150–440)
RBC: 3.2 MIL/uL — AB (ref 3.80–5.20)
RDW: 14.9 % — ABNORMAL HIGH (ref 11.5–14.5)
WBC: 12.4 10*3/uL — ABNORMAL HIGH (ref 3.6–11.0)

## 2017-01-17 LAB — TROPONIN I

## 2017-01-17 LAB — APTT: aPTT: 30 seconds (ref 24–36)

## 2017-01-17 MED ORDER — VENLAFAXINE HCL ER 75 MG PO CP24
150.0000 mg | ORAL_CAPSULE | Freq: Every day | ORAL | Status: DC
  Administered 2017-01-17 – 2017-01-19 (×3): 150 mg via ORAL
  Filled 2017-01-17 (×3): qty 2

## 2017-01-17 MED ORDER — INSULIN ASPART 100 UNIT/ML ~~LOC~~ SOLN
0.0000 [IU] | Freq: Three times a day (TID) | SUBCUTANEOUS | Status: DC
Start: 2017-01-17 — End: 2017-01-19
  Administered 2017-01-17: 3 [IU] via SUBCUTANEOUS
  Administered 2017-01-17: 5 [IU] via SUBCUTANEOUS
  Administered 2017-01-17: 3 [IU] via SUBCUTANEOUS
  Administered 2017-01-18: 5 [IU] via SUBCUTANEOUS
  Administered 2017-01-19: 2 [IU] via SUBCUTANEOUS
  Filled 2017-01-17: qty 2
  Filled 2017-01-17: qty 5
  Filled 2017-01-17: qty 3

## 2017-01-17 MED ORDER — PANTOPRAZOLE SODIUM 40 MG PO TBEC
40.0000 mg | DELAYED_RELEASE_TABLET | Freq: Every day | ORAL | Status: DC
  Administered 2017-01-17 – 2017-01-19 (×3): 40 mg via ORAL
  Filled 2017-01-17 (×3): qty 1

## 2017-01-17 MED ORDER — FUROSEMIDE 20 MG PO TABS
20.0000 mg | ORAL_TABLET | Freq: Every day | ORAL | Status: DC
  Administered 2017-01-17 – 2017-01-19 (×3): 20 mg via ORAL
  Filled 2017-01-17 (×3): qty 1

## 2017-01-17 MED ORDER — VITAMIN B-12 1000 MCG PO TABS
1000.0000 ug | ORAL_TABLET | Freq: Every day | ORAL | Status: DC
  Administered 2017-01-17 – 2017-01-19 (×3): 1000 ug via ORAL
  Filled 2017-01-17 (×3): qty 1

## 2017-01-17 MED ORDER — NITROGLYCERIN 0.4 MG SL SUBL: 0.4000 mg | SUBLINGUAL_TABLET | SUBLINGUAL | Status: DC | PRN

## 2017-01-17 MED ORDER — SODIUM CHLORIDE 0.9 % IV SOLN
INTRAVENOUS | Status: DC
  Administered 2017-01-17 (×2): via INTRAVENOUS

## 2017-01-17 MED ORDER — METOPROLOL TARTRATE 25 MG PO TABS
25.0000 mg | ORAL_TABLET | Freq: Two times a day (BID) | ORAL | Status: DC
  Administered 2017-01-17 – 2017-01-19 (×4): 25 mg via ORAL
  Filled 2017-01-17 (×5): qty 1

## 2017-01-17 MED ORDER — VANCOMYCIN HCL IN DEXTROSE 1-5 GM/200ML-% IV SOLN: 1000.0000 mg | INTRAVENOUS | Status: DC

## 2017-01-17 MED ORDER — CEFTRIAXONE SODIUM-DEXTROSE 1-3.74 GM-% IV SOLR
1.0000 g | INTRAVENOUS | Status: DC
  Administered 2017-01-18 – 2017-01-19 (×2): 1 g via INTRAVENOUS
  Filled 2017-01-17 (×3): qty 50

## 2017-01-17 MED ORDER — ENOXAPARIN SODIUM 40 MG/0.4ML ~~LOC~~ SOLN
40.0000 mg | SUBCUTANEOUS | Status: DC
  Administered 2017-01-17 – 2017-01-18 (×2): 40 mg via SUBCUTANEOUS
  Filled 2017-01-17 (×2): qty 0.4

## 2017-01-17 MED ORDER — CEFTRIAXONE SODIUM-DEXTROSE 1-3.74 GM-% IV SOLR
1.0000 g | Freq: Once | INTRAVENOUS | Status: AC
  Administered 2017-01-17: 1 g via INTRAVENOUS

## 2017-01-17 MED ORDER — ACETAMINOPHEN 650 MG RE SUPP: 650.0000 mg | Freq: Four times a day (QID) | RECTAL | Status: DC | PRN

## 2017-01-17 MED ORDER — FESOTERODINE FUMARATE ER 4 MG PO TB24
4.0000 mg | ORAL_TABLET | Freq: Every day | ORAL | Status: DC
  Administered 2017-01-17 – 2017-01-19 (×3): 4 mg via ORAL
  Filled 2017-01-17 (×3): qty 1

## 2017-01-17 MED ORDER — INSULIN ASPART 100 UNIT/ML ~~LOC~~ SOLN
0.0000 [IU] | Freq: Three times a day (TID) | SUBCUTANEOUS | Status: DC
  Filled 2017-01-17: qty 3

## 2017-01-17 MED ORDER — INSULIN GLARGINE 300 UNIT/ML ~~LOC~~ SOPN: 35.0000 [IU] | PEN_INJECTOR | Freq: Every day | SUBCUTANEOUS | Status: DC

## 2017-01-17 MED ORDER — ACETAMINOPHEN 325 MG PO TABS
650.0000 mg | ORAL_TABLET | Freq: Four times a day (QID) | ORAL | Status: DC | PRN
Start: 2017-01-17 — End: 2017-01-19

## 2017-01-17 MED ORDER — CEFTRIAXONE SODIUM-DEXTROSE 1-3.74 GM-% IV SOLR
INTRAVENOUS | Status: AC
  Filled 2017-01-17: qty 50

## 2017-01-17 MED ORDER — VANCOMYCIN HCL IN DEXTROSE 1-5 GM/200ML-% IV SOLN
1000.0000 mg | Freq: Once | INTRAVENOUS | Status: AC
  Administered 2017-01-17: 1000 mg via INTRAVENOUS
  Filled 2017-01-17: qty 200

## 2017-01-17 MED ORDER — ONDANSETRON HCL 4 MG/2ML IJ SOLN: 4.0000 mg | Freq: Four times a day (QID) | INTRAMUSCULAR | Status: DC | PRN

## 2017-01-17 MED ORDER — TICAGRELOR 90 MG PO TABS
90.0000 mg | ORAL_TABLET | Freq: Two times a day (BID) | ORAL | Status: DC
  Administered 2017-01-17 – 2017-01-19 (×5): 90 mg via ORAL
  Filled 2017-01-17 (×6): qty 1

## 2017-01-17 MED ORDER — ADULT MULTIVITAMIN W/MINERALS CH
1.0000 | ORAL_TABLET | Freq: Every day | ORAL | Status: DC
  Administered 2017-01-17 – 2017-01-19 (×3): 1 via ORAL
  Filled 2017-01-17 (×3): qty 1

## 2017-01-17 MED ORDER — INSULIN ASPART 100 UNIT/ML ~~LOC~~ SOLN: 0.0000 [IU] | Freq: Every day | SUBCUTANEOUS | Status: DC

## 2017-01-17 MED ORDER — FERROUS SULFATE 325 (65 FE) MG PO TABS
325.0000 mg | ORAL_TABLET | Freq: Every day | ORAL | Status: DC
  Administered 2017-01-17 – 2017-01-19 (×3): 325 mg via ORAL
  Filled 2017-01-17 (×3): qty 1

## 2017-01-17 MED ORDER — BIOTIN 5 MG PO CAPS: 5.0000 mg | ORAL_CAPSULE | Freq: Every day | ORAL | Status: DC

## 2017-01-17 MED ORDER — MORPHINE SULFATE ER 30 MG PO TBCR
30.0000 mg | EXTENDED_RELEASE_TABLET | Freq: Two times a day (BID) | ORAL | Status: DC
  Administered 2017-01-17 – 2017-01-19 (×5): 30 mg via ORAL
  Filled 2017-01-17 (×5): qty 1

## 2017-01-17 MED ORDER — ATORVASTATIN CALCIUM 20 MG PO TABS
20.0000 mg | ORAL_TABLET | Freq: Every day | ORAL | Status: DC
  Administered 2017-01-17 – 2017-01-19 (×3): 20 mg via ORAL
  Filled 2017-01-17 (×4): qty 1

## 2017-01-17 MED ORDER — VITAMIN D (ERGOCALCIFEROL) 1.25 MG (50000 UNIT) PO CAPS
50000.0000 [IU] | ORAL_CAPSULE | ORAL | Status: DC
  Administered 2017-01-17: 50000 [IU] via ORAL
  Filled 2017-01-17: qty 1

## 2017-01-17 MED ORDER — LISINOPRIL 20 MG PO TABS
20.0000 mg | ORAL_TABLET | Freq: Every day | ORAL | Status: DC
  Administered 2017-01-17 – 2017-01-19 (×3): 20 mg via ORAL
  Filled 2017-01-17 (×3): qty 1

## 2017-01-17 MED ORDER — MIRTAZAPINE 15 MG PO TABS
15.0000 mg | ORAL_TABLET | Freq: Every day | ORAL | Status: DC
  Administered 2017-01-17 – 2017-01-18 (×2): 15 mg via ORAL
  Filled 2017-01-17 (×2): qty 1

## 2017-01-17 MED ORDER — ALPRAZOLAM 1 MG PO TABS
1.0000 mg | ORAL_TABLET | Freq: Every evening | ORAL | Status: DC | PRN
  Administered 2017-01-18: 1 mg via ORAL
  Filled 2017-01-17: qty 1

## 2017-01-17 MED ORDER — ASPIRIN EC 81 MG PO TBEC
81.0000 mg | DELAYED_RELEASE_TABLET | Freq: Every day | ORAL | Status: DC
  Administered 2017-01-17 – 2017-01-19 (×3): 81 mg via ORAL
  Filled 2017-01-17 (×3): qty 1

## 2017-01-17 MED ORDER — DEXTROSE 5 % IV SOLN: 1.0000 g | INTRAVENOUS | Status: DC

## 2017-01-17 MED ORDER — DEXTROSE 5 % IV SOLN: 1.0000 g | Freq: Once | INTRAVENOUS | Status: DC

## 2017-01-17 MED ORDER — INSULIN GLARGINE 100 UNIT/ML ~~LOC~~ SOLN
36.0000 [IU] | Freq: Every day | SUBCUTANEOUS | Status: DC
  Administered 2017-01-17 – 2017-01-18 (×2): 36 [IU] via SUBCUTANEOUS
  Filled 2017-01-17 (×3): qty 0.36

## 2017-01-17 MED ORDER — DOCUSATE SODIUM 100 MG PO CAPS
100.0000 mg | ORAL_CAPSULE | Freq: Two times a day (BID) | ORAL | Status: DC
  Administered 2017-01-17: 100 mg via ORAL
  Filled 2017-01-17 (×3): qty 1

## 2017-01-17 MED ORDER — SODIUM CHLORIDE 0.9% FLUSH
3.0000 mL | Freq: Two times a day (BID) | INTRAVENOUS | Status: DC
  Administered 2017-01-19: 3 mL via INTRAVENOUS

## 2017-01-17 MED ORDER — SODIUM CHLORIDE 0.9 % IV BOLUS (SEPSIS)
500.0000 mL | Freq: Once | INTRAVENOUS | Status: AC
  Administered 2017-01-17: 500 mL via INTRAVENOUS

## 2017-01-17 MED ORDER — CYCLOBENZAPRINE HCL 10 MG PO TABS
10.0000 mg | ORAL_TABLET | Freq: Three times a day (TID) | ORAL | Status: DC | PRN
  Administered 2017-01-17 – 2017-01-19 (×4): 10 mg via ORAL
  Filled 2017-01-17 (×4): qty 1

## 2017-01-17 MED ORDER — ONDANSETRON HCL 4 MG PO TABS
4.0000 mg | ORAL_TABLET | Freq: Four times a day (QID) | ORAL | Status: DC | PRN
  Administered 2017-01-18: 4 mg via ORAL
  Filled 2017-01-17: qty 1

## 2017-01-17 MED ORDER — MORPHINE SULFATE 15 MG PO TABS
15.0000 mg | ORAL_TABLET | Freq: Four times a day (QID) | ORAL | Status: DC | PRN
  Administered 2017-01-18 – 2017-01-19 (×5): 15 mg via ORAL
  Filled 2017-01-17 (×5): qty 1

## 2017-01-17 MED ORDER — ISOSORBIDE MONONITRATE ER 30 MG PO TB24
30.0000 mg | ORAL_TABLET | Freq: Every day | ORAL | Status: DC
  Administered 2017-01-17 – 2017-01-19 (×3): 30 mg via ORAL
  Filled 2017-01-17 (×3): qty 1

## 2017-01-17 NOTE — Progress Notes (Signed)
PHARMACIST - PHYSICIAN ORDER COMMUNICATION  CONCERNING: P&T Medication Policy on Herbal Medications  DESCRIPTION:  This patient's order for:  biotin  has been noted.  This product(s) is classified as an "herbal" or natural product. Due to a lack of definitive safety studies or FDA approval, nonstandard manufacturing practices, plus the potential risk of unknown drug-drug interactions while on inpatient medications, the Pharmacy and Therapeutics Committee does not permit the use of "herbal" or natural products of this type within [facility].   ACTION TAKEN: The pharmacy department is unable to verify this order at this time Please reevaluate patient's clinical condition at discharge and address if the herbal or natural product(s) should be resumed at that time.   

## 2017-01-17 NOTE — ED Notes (Signed)
Placed bed alarm on patient. Placed fall mats on floor.

## 2017-01-17 NOTE — H&P (Signed)
Jessica Donovan is an 71 y.o. female.   Chief Complaint: Falls HPI: The patient with past medical history of CAD, diabetes mellitus type 2, and chronic pain presents tot he emergency department following a fall.  She states that it took her a long time to crawl on the floor to the phone to call for help.  She fell at least 4 times yesterday.  She cannot recall if she lost consciousness.  The patient is very somnolent which makes it difficult for her to contribute to her history. The patient has suffered multiple contusions to her head. CT of her head showed no acute abnormalities. However, the patient was tachycardic, tachypneic and leukocytosis was present on lab work which prompted the emergency department staff to initiate sepsis protocol. Urinalysis showed infection as likely source. The patient received ceftriaxone in the emergency department prior to the hospitalist service being called for admission.  Past Medical History:  Diagnosis Date  . Chronic back pain   . Coronary artery disease   . Diabetes mellitus without complication ([place])   . Hypertension   . Low back pain   . Myocardial infarction   . Osteomyelitis of toe ([place]) 01/23/2016  . Stroke [facility])     Past Surgical History:  Procedure Laterality Date  . AMPUTATION TOE Left 11/10/2015   Procedure: AMPUTATION TOE;  Surgeon: Sharlotte Alamo, MD;  Location: [facility] ORS;  Service: Podiatry;  Laterality: Left;  . AMPUTATION TOE Left 01/24/2016   Procedure: AMPUTATION TOE (2nd mpj);  Surgeon: Sharlotte Alamo, DPM;  Location: [facility] ORS;  Service: Podiatry;  Laterality: Left;  . CARDIAC CATHETERIZATION N/A 11/12/2015   Procedure: Left Heart Cath and Coronary Angiography;  Surgeon: Isaias Cowman, MD;  Location: [place] [facility];  Service: Cardiovascular;  Laterality: N/A;  . TOE AMPUTATION      Family History  Problem Relation Age of Onset  . CAD Mother   . CAD Father    Social History:  reports that she has never smoked. She has never used  smokeless tobacco. She reports that she does not drink alcohol or use drugs.  Allergies:  Allergies  Allergen Reactions  . Codeine Nausea And Vomiting  . Influenza Vaccines Other (See Comments)    Reaction:  Caused pt to pass out   . Methadone Hives and Itching  . Percocet [Oxycodone-Acetaminophen] Nausea And Vomiting  . Tetanus Toxoids Swelling and Other (See Comments)    Reaction:  Swelling at injection site  . Tetracyclines & Related Rash    Medications Prior to Admission  Medication Sig Dispense Refill  . acetaminophen (TYLENOL) 500 MG tablet Take 1,000 mg by mouth every 4 (four) hours as needed for mild pain or headache.     . ALPRAZolam (XANAX) 1 MG tablet Take 1 mg by mouth at bedtime as needed for anxiety or sleep.    [name] aspirin EC 81 MG tablet Take 81 mg by mouth daily.    [name] atorvastatin (LIPITOR) 20 MG tablet Take 20 mg by mouth daily.    . Biotin 5 MG CAPS Take 5 mg by mouth daily at 12 noon.     . cyclobenzaprine (FLEXERIL) 10 MG tablet Take 1 tablet (10 mg total) by mouth 3 (three) times daily as needed for muscle spasms. 20 tablet 0  . ferrous sulfate 325 (65 FE) MG tablet Take 325 mg by mouth daily with breakfast.    . furosemide (LASIX) 20 MG tablet Take 20 mg by mouth daily.    . insulin  aspart (NOVOLOG) 100 UNIT/ML injection Inject 3-15 Units into the skin 3 (three) times daily with meals as needed for high blood sugar. Pt uses as needed per sliding scale:    Less than 140:  0 units  140-180:  3 units 181-220:  4 units 221- 260:  6 units 261- 320:  8 units 321-360:  10 units 361-400:  12 units Greater than 400:  15 units    . Insulin Glargine (TOUJEO SOLOSTAR) 300 UNIT/ML SOPN Inject 60 Units into the skin daily.     . isosorbide mononitrate (IMDUR) 30 MG 24 hr tablet Take 30 mg by mouth daily.     [name] lisinopril (PRINIVIL,ZESTRIL) 20 MG tablet Take 20 mg by mouth daily.    . metoprolol (LOPRESSOR) 50 MG tablet Take 25 mg by mouth 2 (two) times daily.     .  mirtazapine (REMERON) 15 MG tablet Take 15 mg by mouth at bedtime.    [name] morphine (MSIR) 15 MG tablet Take 15 mg by mouth every 6 (six) hours as needed for severe pain.    [name] Morphine Sulfate ER 30 MG TBEA Take 30 mg by mouth 3 (three) times daily.    . Multiple Vitamin (MULTIVITAMIN WITH MINERALS) TABS tablet Take 1 tablet by mouth daily.    . mupirocin cream (BACTROBAN) 2 % Apply topically 2 (two) times daily. 15 g 0  . nitrofurantoin, macrocrystal-monohydrate, (MACROBID) 100 MG capsule Take 100 mg by mouth daily.    . nitroGLYCERIN (NITROSTAT) 0.4 MG SL tablet Place 0.4 mg under the tongue every 5 (five) minutes as needed for chest pain. Reported on 04/16/2016    . omeprazole (PRILOSEC) 20 MG capsule Take 20 mg by mouth daily.    . ticagrelor (BRILINTA) 90 MG TABS tablet Take 90 mg by mouth 2 (two) times daily.    [name] tolterodine (DETROL LA) 4 MG 24 hr capsule Take 4 mg by mouth daily.    . traZODone (DESYREL) 50 MG tablet Take 1 tablet (50 mg total) by mouth at bedtime. (Patient not taking: Reported on 01/17/2017) 30 tablet 6  . venlafaxine XR (EFFEXOR-XR) 150 MG 24 hr capsule Take 150 mg by mouth daily with breakfast.    . vitamin B-12 (CYANOCOBALAMIN) 1000 MCG tablet Take 1,000 mcg by mouth daily.    . Vitamin D, Ergocalciferol, (DRISDOL) 50000 units CAPS capsule Take 50,000 Units by mouth every 7 (seven) days. Pt takes on Sunday.      Results for orders placed or performed during the hospital encounter of 01/17/17 (from the past 48 hour(s))  CBC with Differential     Status: Abnormal   Collection Time: 01/17/17 12:27 AM  Result Value Ref Range   WBC 14.5 (H) 3.6 - 11.0 K/uL   RBC 3.47 (L) 3.80 - 5.20 MIL/uL   Hemoglobin 11.8 (L) 12.0 - 16.0 g/dL   HCT 34.0 (L) 35.0 - 47.0 %   MCV 97.9 80.0 - 100.0 fL   MCH 34.1 (H) 26.0 - 34.0 pg   MCHC 34.8 32.0 - 36.0 g/dL   RDW 15.1 (H) 11.5 - 14.5 %   Platelets 273 150 - 440 K/uL   Neutrophils Relative % 84 %   Neutro Abs 12.2 (H) 1.4 - 6.5 K/uL    Lymphocytes Relative 7 %   Lymphs Abs 1.0 1.0 - 3.6 K/uL   Monocytes Relative 9 %   Monocytes Absolute 1.3 (H) 0.2 - 0.9 K/uL   Eosinophils Relative 0 %   Eosinophils Absolute  0.0 0 - 0.7 K/uL   Basophils Relative 0 %   Basophils Absolute 0.0 0 - 0.1 K/uL   Smear Review MORPHOLOGY UNREMARKABLE   Basic metabolic panel     Status: Abnormal   Collection Time: 01/17/17 12:27 AM  Result Value Ref Range   Sodium 134 (L) 135 - 145 mmol/L   Potassium 4.7 3.5 - 5.1 mmol/L   Chloride 101 101 - 111 mmol/L   CO2 23 22 - 32 mmol/L   Glucose, Bld 164 (H) 65 - 99 mg/dL   BUN 28 (H) 6 - 20 mg/dL   Creatinine, Ser 1.31 (H) 0.44 - 1.00 mg/dL   Calcium 9.2 8.9 - 10.3 mg/dL   GFR calc non Af Amer 40 (L) >60 mL/min   GFR calc Af Amer 47 (L) >60 mL/min    Comment: (NOTE) The eGFR has been calculated using the CKD EPI equation. This calculation has not been validated in all clinical situations. eGFR's persistently <60 mL/min signify possible Chronic Kidney Disease.    Anion gap 10 5 - 15  Troponin I     Status: None   Collection Time: 01/17/17 12:27 AM  Result Value Ref Range   Troponin I <0.03 <0.03 ng/mL  Urinalysis, Complete w Microscopic     Status: Abnormal   Collection Time: 01/17/17 12:27 AM  Result Value Ref Range   Color, Urine AMBER (A) YELLOW    Comment: BIOCHEMICALS MAY BE AFFECTED BY COLOR   APPearance CLEAR (A) CLEAR   Specific Gravity, Urine 1.020 1.005 - 1.030   pH 5.0 5.0 - 8.0   Glucose, UA NEGATIVE NEGATIVE mg/dL   Hgb urine dipstick NEGATIVE NEGATIVE   Bilirubin Urine NEGATIVE NEGATIVE   Ketones, ur 20 (A) NEGATIVE mg/dL   Protein, ur NEGATIVE NEGATIVE mg/dL   Nitrite NEGATIVE NEGATIVE   Leukocytes, UA MODERATE (A) NEGATIVE   RBC / HPF 0-5 0 - 5 RBC/hpf   WBC, UA 6-30 0 - 5 WBC/hpf   Bacteria, UA NONE SEEN NONE SEEN   Squamous Epithelial / LPF 0-5 (A) NONE SEEN   WBC Clumps PRESENT    Hyaline Casts, UA PRESENT   Protime-INR     Status: None   Collection Time:  01/17/17 12:27 AM  Result Value Ref Range   Prothrombin Time 13.5 11.4 - 15.2 seconds   INR 1.03   APTT     Status: None   Collection Time: 01/17/17 12:27 AM  Result Value Ref Range   aPTT 30 24 - 36 seconds  Glucose, capillary     Status: Abnormal   Collection Time: 01/17/17  3:17 AM  Result Value Ref Range   Glucose-Capillary 141 (H) 65 - 99 mg/dL   Dg Chest 1 View  Result Date: 01/17/2017 CLINICAL DATA:  71 year old female with head injury. EXAM: CHEST 1 VIEW COMPARISON:  Chest radiograph dated 04/08/2016 FINDINGS: There is shallow inspiration. Left lung base streaky densities appear to improve with better inspiration and most consistent with atelectatic changes. There is no focal consolidation, pleural effusion, or pneumothorax. The cardiac silhouette is within normal limits. There is osteopenia with degenerative changes of the spine. No acute osseous pathology. IMPRESSION: No acute cardiopulmonary process. Shallow inspiration with left lung base atelectatic changes. Electronically Signed   By: Anner Crete M.D.   On: 01/17/2017 02:00   Ct Head Wo Contrast  Result Date: 01/17/2017 CLINICAL DATA:  Initial valuation for recent falls. Bruising on right side of face. EXAM: CT HEAD WITHOUT CONTRAST CT  MAXILLOFACIAL WITHOUT CONTRAST CT CERVICAL SPINE WITHOUT CONTRAST TECHNIQUE: Multidetector CT imaging of the head, cervical spine, and maxillofacial structures were performed using the standard protocol without intravenous contrast. Multiplanar CT image reconstructions of the cervical spine and maxillofacial structures were also generated. COMPARISON:  None. FINDINGS: CT HEAD FINDINGS Brain: Generalized age-related cerebral atrophy with chronic microvascular ischemic disease. No acute intracranial hemorrhage. No evidence for acute large vessel territory infarct. No mass lesion, midline shift or mass effect. No hydrocephalus. No extra-axial fluid collection. Vascular: No hyperdense vessel.  Scattered vascular calcifications noted within the carotid siphons. Skull: Scalp soft tissues demonstrate no acute abnormality. Calvarium intact. Other: Mastoids are clear. CT MAXILLOFACIAL FINDINGS Osseous: Zygomatic arches intact. No acute maxillary fracture. Pterygoid plates intact. No acute nasal bone fracture. Nasal septum midline intact. No acute mandibular fracture. Mandibular condyles normally situated. No acute abnormality about the dentition. Orbits: Globes intact. Patient is status post lens extraction bilaterally. No retro-orbital hematoma or other pathology. Bony orbits intact. Sinuses: Clear. Soft tissues: Right facial contusion with superimposed 11 x 14 x 10 mm hematoma. No other significant soft tissue swelling within the face. CT CERVICAL SPINE FINDINGS Alignment: Study degraded by motion artifact. Vertebral bodies normally aligned with preservation of the normal cervical lordosis. No listhesis or subluxation. Skull base and vertebrae: Skullbase intact. Normal C1-2 articulations preserved. Dens is intact. Vertebral body heights maintained. No acute fracture. Soft tissues and spinal canal: Visualized soft tissues of the neck demonstrate no acute abnormality. Vascular calcifications noted about the carotid bifurcations. No prevertebral edema. Disc levels: Moderate degenerate spondylolysis noted at C5-6. Multilevel facet arthrosis, most notable at C3-4 on the left. Upper chest: Visualized upper chest demonstrates no acute abnormality. Visualized lung apices are clear. IMPRESSION: 1. No acute intracranial process identified. 2. Small right facial contusion as above. No other acute maxillofacial injury identified. No fracture. 3. No acute traumatic injury within cervical spine. Electronically Signed   By: Jeannine Boga M.D.   On: 01/17/2017 02:12   Ct Cervical Spine Wo Contrast  Result Date: 01/17/2017 CLINICAL DATA:  Initial valuation for recent falls. Bruising on right side of face. EXAM: CT  HEAD WITHOUT CONTRAST CT MAXILLOFACIAL WITHOUT CONTRAST CT CERVICAL SPINE WITHOUT CONTRAST TECHNIQUE: Multidetector CT imaging of the head, cervical spine, and maxillofacial structures were performed using the standard protocol without intravenous contrast. Multiplanar CT image reconstructions of the cervical spine and maxillofacial structures were also generated. COMPARISON:  None. FINDINGS: CT HEAD FINDINGS Brain: Generalized age-related cerebral atrophy with chronic microvascular ischemic disease. No acute intracranial hemorrhage. No evidence for acute large vessel territory infarct. No mass lesion, midline shift or mass effect. No hydrocephalus. No extra-axial fluid collection. Vascular: No hyperdense vessel. Scattered vascular calcifications noted within the carotid siphons. Skull: Scalp soft tissues demonstrate no acute abnormality. Calvarium intact. Other: Mastoids are clear. CT MAXILLOFACIAL FINDINGS Osseous: Zygomatic arches intact. No acute maxillary fracture. Pterygoid plates intact. No acute nasal bone fracture. Nasal septum midline intact. No acute mandibular fracture. Mandibular condyles normally situated. No acute abnormality about the dentition. Orbits: Globes intact. Patient is status post lens extraction bilaterally. No retro-orbital hematoma or other pathology. Bony orbits intact. Sinuses: Clear. Soft tissues: Right facial contusion with superimposed 11 x 14 x 10 mm hematoma. No other significant soft tissue swelling within the face. CT CERVICAL SPINE FINDINGS Alignment: Study degraded by motion artifact. Vertebral bodies normally aligned with preservation of the normal cervical lordosis. No listhesis or subluxation. Skull base and vertebrae: Skullbase intact. Normal C1-2 articulations preserved.  Dens is intact. Vertebral body heights maintained. No acute fracture. Soft tissues and spinal canal: Visualized soft tissues of the neck demonstrate no acute abnormality. Vascular calcifications noted  about the carotid bifurcations. No prevertebral edema. Disc levels: Moderate degenerate spondylolysis noted at C5-6. Multilevel facet arthrosis, most notable at C3-4 on the left. Upper chest: Visualized upper chest demonstrates no acute abnormality. Visualized lung apices are clear. IMPRESSION: 1. No acute intracranial process identified. 2. Small right facial contusion as above. No other acute maxillofacial injury identified. No fracture. 3. No acute traumatic injury within cervical spine. Electronically Signed   By: Jeannine Boga M.D.   On: 01/17/2017 02:12   Ct Maxillofacial Wo Contrast  Result Date: 01/17/2017 CLINICAL DATA:  Initial valuation for recent falls. Bruising on right side of face. EXAM: CT HEAD WITHOUT CONTRAST CT MAXILLOFACIAL WITHOUT CONTRAST CT CERVICAL SPINE WITHOUT CONTRAST TECHNIQUE: Multidetector CT imaging of the head, cervical spine, and maxillofacial structures were performed using the standard protocol without intravenous contrast. Multiplanar CT image reconstructions of the cervical spine and maxillofacial structures were also generated. COMPARISON:  None. FINDINGS: CT HEAD FINDINGS Brain: Generalized age-related cerebral atrophy with chronic microvascular ischemic disease. No acute intracranial hemorrhage. No evidence for acute large vessel territory infarct. No mass lesion, midline shift or mass effect. No hydrocephalus. No extra-axial fluid collection. Vascular: No hyperdense vessel. Scattered vascular calcifications noted within the carotid siphons. Skull: Scalp soft tissues demonstrate no acute abnormality. Calvarium intact. Other: Mastoids are clear. CT MAXILLOFACIAL FINDINGS Osseous: Zygomatic arches intact. No acute maxillary fracture. Pterygoid plates intact. No acute nasal bone fracture. Nasal septum midline intact. No acute mandibular fracture. Mandibular condyles normally situated. No acute abnormality about the dentition. Orbits: Globes intact. Patient is status  post lens extraction bilaterally. No retro-orbital hematoma or other pathology. Bony orbits intact. Sinuses: Clear. Soft tissues: Right facial contusion with superimposed 11 x 14 x 10 mm hematoma. No other significant soft tissue swelling within the face. CT CERVICAL SPINE FINDINGS Alignment: Study degraded by motion artifact. Vertebral bodies normally aligned with preservation of the normal cervical lordosis. No listhesis or subluxation. Skull base and vertebrae: Skullbase intact. Normal C1-2 articulations preserved. Dens is intact. Vertebral body heights maintained. No acute fracture. Soft tissues and spinal canal: Visualized soft tissues of the neck demonstrate no acute abnormality. Vascular calcifications noted about the carotid bifurcations. No prevertebral edema. Disc levels: Moderate degenerate spondylolysis noted at C5-6. Multilevel facet arthrosis, most notable at C3-4 on the left. Upper chest: Visualized upper chest demonstrates no acute abnormality. Visualized lung apices are clear. IMPRESSION: 1. No acute intracranial process identified. 2. Small right facial contusion as above. No other acute maxillofacial injury identified. No fracture. 3. No acute traumatic injury within cervical spine. Electronically Signed   By: Jeannine Boga M.D.   On: 01/17/2017 02:12    Review of Systems  Unable to perform ROS: Mental status change  Genitourinary: Positive for frequency.  Musculoskeletal: Positive for back pain and falls.  Neurological: Positive for weakness.    Blood pressure (!) 148/49, pulse 98, temperature 98.2 F (36.8 C), temperature source Oral, resp. rate 20, height _0  (1.626 m), weight 90.7 kg (200 lb), SpO2 100 %. Physical Exam  Vitals reviewed. Constitutional: She is oriented to person, place, and time. She appears well-developed and well-nourished. No distress.  Very somnolent  HENT:  Head: Normocephalic. Head is with contusion (multiple).    Mouth/Throat: Oropharynx is  clear and moist.  Eyes: Conjunctivae and EOM are normal. Pupils are  equal, round, and reactive to light. No scleral icterus.  Neck: Normal range of motion. Neck supple. No JVD present. No tracheal deviation present. No thyromegaly present.  Cardiovascular: Normal rate, regular rhythm and normal heart sounds.  Exam reveals no gallop and no friction rub.   No murmur heard. Respiratory: Effort normal and breath sounds normal.  GI: Soft. Bowel sounds are normal. She exhibits no distension. There is no tenderness.  Lymphadenopathy:    She has no cervical adenopathy.  Neurological: She is alert and oriented to person, place, and time. No cranial nerve deficit. She exhibits normal muscle tone.  Skin: Skin is warm and dry.  Abrasions and bruising to knees and shins  Psychiatric: She has a normal mood and affect. Thought content normal.  Difficult to completely assess mental status as the patient routinely falls asleep     Assessment/Plan This is a 71 year old female admitted for sepsis. 1. Sepsis: The patient meets criteria via tachycardia, tachypnea and leukocytosis. She is hemodynamically stable. Likely source is urinary infection. She received ceftriaxone. I have added vancomycin. Follow urine and blood cultures for growth and sensitivities. 2. Somnolence: Etiology may be heavy use of prescription narcotics at home. Apparently the patient has chronic back pain. However, this likely contributes to her frequent falls. I have decreased her long-acting pain medication to twice a day. 3. CAD: Stable; continue aspirin, Brillinta and Imdur 4. Hypertension: Controlled; continue lisinopril and metoprolol 5. Diabetes mellitus type 2: Continue basal insulin adjusted for hospital diet; sliding scale insulin while hospitalized 6. Hyperlipidemia: Continue statin therapy 7. Dementia: Continue mirtazapine and Effexor 8. Urinary incontinence: Continue tolterodine 9. DVT prophylaxis: Lovenox 10. GI prophylaxis:  Pantoprazole per home regimen The patient is a DO NOT RESUSCITATE. Time spent on admission was inpatient care approximately 45 minutes  Harrie Foreman, MD 01/17/2017, 4:36 AM

## 2017-01-17 NOTE — ED Notes (Signed)
Performed in and out cath with Waldo LaineKaitlin, RN

## 2017-01-17 NOTE — ED Provider Notes (Signed)
[facility] Emergency Department Provider Note  ____________________________________________   I have reviewed the triage vital signs and the nursing notes.   HISTORY  Chief Complaint Fall    HPI Jessica Donovan is a 71 y.o. female who presents today complaining of a fall. Patient states she has a history of frequent falls but she felt more frequently today. She states her whole body hurts but she doesn't think anything is broken. She denies any syncopal episodes. She states she feels weaker than she normally does today. Patient is DNR/DNI. She lives by herself.  History is somewhat limited.   Past Medical History:  Diagnosis Date  . Chronic back pain   . Coronary artery disease   . Diabetes mellitus without complication (HCC)   . Hypertension   . Low back pain   . Myocardial infarction   . Osteomyelitis of toe (HCC) 01/23/2016  . Stroke [facility])     Patient Active Problem List   Diagnosis Date Noted  . Cellulitis of fourth toe of right foot 04/10/2016  . Somnolence 04/09/2016  . Dementia 04/09/2016  . Cellulitis 04/03/2016  . Hematoma of right parietal scalp 02/23/2016  . Multiple falls 02/23/2016  . Physical debility 02/23/2016  . DM2 (diabetes mellitus, type 2) (HCC) 02/23/2016  . Syncope and collapse 02/23/2016  . Polypharmacy 02/23/2016  . Osteomyelitis of toe (HCC) 01/23/2016  . Rhabdomyolysis 12/07/2015  . ARF (acute renal failure) (HCC) 11/29/2015  . Hypotension 11/29/2015  . 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 Angina effort (HCC) 11/07/2015  . Osteomyelitis (HCC) 11/07/2015    Past Surgical History:  Procedure Laterality Date  . AMPUTATION TOE Left 11/10/2015   Procedure: AMPUTATION TOE;  Surgeon: Linus Galas, MD;  Location: [facility] ORS;  Service: Podiatry;  Laterality: Left;  . AMPUTATION TOE Left 01/24/2016   Procedure: AMPUTATION TOE (2nd mpj);  Surgeon: Linus Galas, DPM;  Location: [facility] ORS;  Service: Podiatry;  Laterality: Left;   . CARDIAC CATHETERIZATION N/A 11/12/2015   Procedure: Left Heart Cath and Coronary Angiography;  Surgeon: Marcina Millard, MD;  Location: [facility] INVASIVE CV LAB;  Service: Cardiovascular;  Laterality: N/A;  . TOE AMPUTATION      Prior to Admission medications   Medication Sig Start Date End Date Taking? Authorizing Provider  ALPRAZolam Prudy Feeler) 1 MG tablet Take 1 mg by mouth at bedtime as needed for anxiety or sleep.   Yes Historical Provider, MD  aspirin EC 81 MG tablet Take 81 mg by mouth daily.   Yes Historical Provider, MD  atorvastatin (LIPITOR) 20 MG tablet Take 20 mg by mouth daily.   Yes Historical Provider, MD  Biotin 5 MG CAPS Take 5 mg by mouth daily at 12 noon.    Yes Historical Provider, MD  cyclobenzaprine (FLEXERIL) 10 MG tablet Take 1 tablet (10 mg total) by mouth 3 (three) times daily as needed for muscle spasms. 01/27/16  Yes Enid Baas, MD  ferrous sulfate 325 (65 FE) MG tablet Take 325 mg by mouth daily with breakfast.   Yes Historical Provider, MD  furosemide (LASIX) 20 MG tablet Take 20 mg by mouth daily.   Yes Historical Provider, MD  insulin aspart (NOVOLOG) 100 UNIT/ML injection Inject 3-15 Units into the skin 3 (three) times daily with meals as needed for high blood sugar. Pt uses as needed per sliding scale:    Less than 140:  0 units  140-180:  3 units 181-220:  4 units 221- 260:  6 units 261- 320:  8  units 321-360:  10 units 361-400:  12 units Greater than 400:  15 units   Yes Historical Provider, MD  Insulin Glargine (TOUJEO SOLOSTAR) 300 UNIT/ML SOPN Inject 60 Units into the skin daily.    Yes Historical Provider, MD  isosorbide mononitrate (IMDUR) 30 MG 24 hr tablet Take 30 mg by mouth daily.    Yes Historical Provider, MD  lisinopril (PRINIVIL,ZESTRIL) 20 MG tablet Take 20 mg by mouth daily.   Yes Historical Provider, MD  metoprolol (LOPRESSOR) 50 MG tablet Take 25 mg by mouth 2 (two) times daily.    Yes Historical Provider, MD  mirtazapine  (REMERON) 15 MG tablet Take 15 mg by mouth at bedtime.   Yes Historical Provider, MD  morphine (MSIR) 15 MG tablet Take 15 mg by mouth every 6 (six) hours as needed for severe pain.   Yes Historical Provider, MD  Morphine Sulfate ER 30 MG TBEA Take 30 mg by mouth 3 (three) times daily.   Yes Historical Provider, MD  Multiple Vitamin (MULTIVITAMIN WITH MINERALS) TABS tablet Take 1 tablet by mouth daily.   Yes Historical Provider, MD  nitrofurantoin, macrocrystal-monohydrate, (MACROBID) 100 MG capsule Take 100 mg by mouth daily.   Yes Historical Provider, MD  omeprazole (PRILOSEC) 20 MG capsule Take 20 mg by mouth daily.   Yes Historical Provider, MD  ticagrelor (BRILINTA) 90 MG TABS tablet Take 90 mg by mouth 2 (two) times daily.   Yes Historical Provider, MD  tolterodine (DETROL LA) 4 MG 24 hr capsule Take 4 mg by mouth daily.   Yes Historical Provider, MD  traZODone (DESYREL) 50 MG tablet Take 1 tablet (50 mg total) by mouth at bedtime. Patient taking differently: Take 100 mg by mouth at bedtime as needed for sleep.  04/10/16  Yes Katharina Caperima Vaickute, MD  venlafaxine XR (EFFEXOR-XR) 150 MG 24 hr capsule Take 150 mg by mouth daily with breakfast.   Yes Historical Provider, MD  vitamin B-12 (CYANOCOBALAMIN) 1000 MCG tablet Take 1,000 mcg by mouth daily.   Yes Historical Provider, MD  Vitamin D, Ergocalciferol, (DRISDOL) 50000 units CAPS capsule Take 50,000 Units by mouth every 7 (seven) days. Pt takes on Sunday.   Yes Historical Provider, MD  acetaminophen (TYLENOL) 500 MG tablet Take 1,000 mg by mouth every 4 (four) hours as needed for mild pain or headache.     Historical Provider, MD  mupirocin cream (BACTROBAN) 2 % Apply topically 2 (two) times daily. 04/10/16   Katharina Caperima Vaickute, MD  nitroGLYCERIN (NITROSTAT) 0.4 MG SL tablet Place 0.4 mg under the tongue every 5 (five) minutes as needed for chest pain. Reported on 04/16/2016    Historical Provider, MD    Allergies Codeine; Influenza vaccines; Methadone;  Percocet [oxycodone-acetaminophen]; Tetanus toxoids; and Tetracyclines & related  Family History  Problem Relation Age of Onset  . CAD Mother   . CAD Father     Social History Social History  Substance Use Topics  . Smoking status: Never Smoker  . Smokeless tobacco: Never Used  . Alcohol use No    Review of Systems Constitutional: No fever/chills Eyes: No visual changes. ENT: No sore throat. No stiff neck no neck pain Cardiovascular: Denies chest pain. Respiratory: Denies shortness of breath. Gastrointestinal:   no vomiting.  No diarrhea.  No constipation. Genitourinary: Negative for dysuria. Musculoskeletal: Negative lower extremity swelling Skin: Negative for rash. Neurological: Negative for severe headaches, focal weakness or numbness. 10-point ROS otherwise negative.  ____________________________________________   PHYSICAL EXAM:  VITAL SIGNS:  ED Triage Vitals  Enc Vitals Group     BP 01/17/17 0015 (!) 100/51     Pulse Rate 01/17/17 0015 (!) 104     Resp 01/17/17 0015 20     Temp 01/17/17 0015 99.2 F (37.3 C)     Temp Source 01/17/17 0015 Oral     SpO2 01/17/17 0014 94 %     Weight 01/17/17 0019 200 lb (90.7 kg)     Height 01/17/17 0019 5\' 4"  (1.626 m)     Head Circumference --      Peak Flow --      Pain Score --      Pain Loc --      Pain Edu? --      Excl. in GC? --     Constitutional: Somewhat somnolent but easily arousable to verbal stimuli, she is oriented to name and place unsure of the exact date.. Eyes: Conjunctivae are normal. PERRL. EOMI. Head: Bruising noted to right face. Nose: No congestion/rhinnorhea. Mouth/Throat: Mucous membranes are moist.  Oropharynx non-erythematous. Neck: No stridor.   Nontender with no meningismus Cardiovascular: Normal rate, regular rhythm. Grossly normal heart sounds.  Good peripheral circulation. Respiratory: Normal respiratory effort.  No retractions. Lungs CTAB. Abdominal: Soft and nontender. No distention.  No guarding no rebound Back:  There is no focal tenderness or step off.  there is no midline tenderness there are no lesions noted. there is no CVA tenderness Musculoskeletal: No lower extremity tenderness, no upper extremity tenderness. No joint effusions, no DVT signs strong distal pulses no edema Neurologic:  Normal speech and language. No gross focal neurologic deficits are appreciated.  Skin:  Skin is warm, dry and intact. No rash noted. Psychiatric: Mood and affect are normal. Speech and behavior are normal.  ____________________________________________   LABS (all labs ordered are listed, but only abnormal results are displayed)  Labs Reviewed  CBC WITH DIFFERENTIAL/PLATELET - Abnormal; Notable for the following:       Result Value   WBC 14.5 (*)    RBC 3.47 (*)    Hemoglobin 11.8 (*)    HCT 34.0 (*)    MCH 34.1 (*)    RDW 15.1 (*)    Neutro Abs 12.2 (*)    Monocytes Absolute 1.3 (*)    All other components within normal limits  BASIC METABOLIC PANEL - Abnormal; Notable for the following:    Sodium 134 (*)    Glucose, Bld 164 (*)    BUN 28 (*)    Creatinine, Ser 1.31 (*)    GFR calc non Af Amer 40 (*)    GFR calc Af Amer 47 (*)    All other components within normal limits  URINALYSIS, COMPLETE (UACMP) WITH MICROSCOPIC - Abnormal; Notable for the following:    Color, Urine AMBER (*)    APPearance CLEAR (*)    Ketones, ur 20 (*)    Leukocytes, UA MODERATE (*)    Squamous Epithelial / LPF 0-5 (*)    All other components within normal limits  URINE CULTURE  CULTURE, BLOOD (ROUTINE X 2)  CULTURE, BLOOD (ROUTINE X 2)  TROPONIN I  PROTIME-INR  APTT   ____________________________________________  EKG  I personally interpreted any EKGs ordered by me or triage No acute ischemic changes noted, sinus rhythm ____________________________________________  RADIOLOGY  I reviewed any imaging ordered by me or triage that were performed during my shift and, if possible,  patient and/or family made aware of any abnormal findings. ____________________________________________  PROCEDURES  Procedure(s) performed: None  Procedures  Critical Care performed: None  ____________________________________________   INITIAL IMPRESSION / ASSESSMENT AND PLAN / ED COURSE  Pertinent labs & imaging results that were available during my care of the patient were reviewed by me and considered in my medical decision making (see chart for details).  For the history from falls fell again today multiple times. Return to EMS this is a very common event for her. However, today she does appear to be somewhat dehydrated creatinine is elevated and she has what appears to be urinary tract infection with an elevated white count. There are obviously some social issues with this patient living on her own, and also given that she is full muscle times a day as dehydrated and he is to have a urinary tract infection, we will start her on antibiotics give her IV fluid and admit her to the hospital.    ____________________________________________   FINAL CLINICAL IMPRESSION(S) / ED DIAGNOSES  Final diagnoses:  Fall, initial encounter  Contusion of face, initial encounter  Lower urinary tract infectious disease  Dehydration      This chart was dictated using voice recognition software.  Despite best efforts to proofread,  errors can occur which can change meaning.      Jeanmarie Plant, MD 01/17/17 [id number]

## 2017-01-17 NOTE — Progress Notes (Signed)
Patient seen and examined this morning.  Awaiting PT and OT consultation for disposition  DC vancomycin and continue Rocephin for UTI/sepsis  Patient with underlying dementia which is obvious on today's examination.

## 2017-01-17 NOTE — ED Triage Notes (Signed)
Per EMS: Patient from [facility] independent living. Pt having increasing number of falls recently, with 3 falls today. Pt has visible bruising to left face.   Patient c/o back/abdominal pain

## 2017-01-17 NOTE — ED Notes (Signed)
Patient speech slurred. MD informed. POCT CBG performed. CBG 147

## 2017-01-17 NOTE — Progress Notes (Signed)
OT Cancellation Note  Patient Details Name: Mellody DanceBarbara Aina MRN: [id number] DOB: [birth year]   Cancelled Treatment:    Reason Eval/Treat Not Completed: Patient declined, no reason specified.  Order received and chart reviewed.  Pt sitting up in bed with O2 in place and politely stated she did not want to have an OT evaluation for fear she would have to pay for it.  She stated she knew about AD for LB dressing skills and will need to use them but continued to decline evaluation.  Rec re-attempting eval in 1-2 days.  Will talk to CM/SW about her benefits and rec meeting with her to discuss.  Pt would really benefit from OT to increase safety and help prevent further falls at home.   Susanne BordersSusan Aeron Lhe[place]ux, OTR/L ascom [phone] 01/17/17, 11:20 AM

## 2017-01-17 NOTE — Evaluation (Signed)
Physical Therapy Evaluation Patient Details Name: Jessica Donovan MRN: [id number] DOB: Nov 09, 1946 Today's Date: 01/17/2017   History of Present Illness  Pt is a 71 y.o. female presenting to hospital after multiple falls at home.  Pt admitted with sepsis, UTI, and somnolence.  PMH includes L 3rd toe amp 11/10/15, L 2nd toe amp 01/24/16, h/o R great toe amp, h/o osteomyeltis, MI, stroke, DM, CAD.  Clinical Impression  Prior to hospital admission, pt was ambulating modified independently with rollator.  Pt lives alone in Independent Living apts.  Pt initially very drowsy laying in bed but woke up well with functional activity.  Currently pt is SBA to min assist with bed mobility, min assist to stand with RW, and min assist to ambulate short distances in room with RW.  Pt appearing to fatigue with activity and increased trunk forward flexion and decreased cadence noted with ambulation 2nd trial.  Pt would benefit from skilled PT to address noted impairments and functional limitations.  Recommend pt discharge to STR when medically appropriate.    Follow Up Recommendations SNF    Equipment Recommendations  Rolling walker with 5" wheels    Recommendations for Other Services       Precautions / Restrictions Precautions Precautions: Fall Restrictions Weight Bearing Restrictions: No      Mobility  Bed Mobility Overal bed mobility: Needs Assistance Bed Mobility: Supine to Sit;Sit to Supine     Supine to sit: Min assist;HOB elevated (assist for trunk) Sit to supine: Supervision;HOB elevated   General bed mobility comments: increased effort to perform by pt  Transfers Overall transfer level: Needs assistance Equipment used: Rolling walker (2 wheeled) Transfers: Sit to/from Stand Sit to Stand: Min assist         General transfer comment: Assist to initiate stand from bed and from toilet (use of grab bar from toilet)  Ambulation/Gait Ambulation/Gait assistance: Min assist Ambulation  Distance (Feet):  (20 feet; 10 feet) Assistive device: Rolling walker (2 wheeled)   Gait velocity: decreased   General Gait Details: decreased B step length/foot clearance/heelstrike; initially with difficulty taking steps but improved gait pattern with distance 1st trial; pt fatigued on 2nd trial with increasing forward flexed posture and decreased cadence  Stairs            Wheelchair Mobility    Modified Rankin (Stroke Patients Only)       Balance Overall balance assessment: Needs assistance Sitting-balance support: Bilateral upper extremity supported;Feet supported Sitting balance-Leahy Scale: Good Sitting balance - Comments: leaning to L to wipe on toilet close SBA   Standing balance support: Single extremity supported;During functional activity Standing balance-Leahy Scale: Fair Standing balance comment: doffing/donning underwear for toileting CGA                             Pertinent Vitals/Pain Pain Assessment: 0-10 Pain Score: 10-Worst pain ever Pain Location: chronic back pain Pain Descriptors / Indicators: Sore;Tender Pain Intervention(s): Limited activity within patient's tolerance;Monitored during session;Repositioned (RN notified regarding pt's pain)     Home Living Family/patient expects to be discharged to:: Assisted living [facility])               Home Equipment: Gilmer Mor - single point;Walker - 4 wheels Additional Comments: about 6 steps total to enter apartment building; ramp available if needed    Prior Function Level of Independence: Independent with assistive device(s)         Comments: Pt  reports multiple recent falls (legs giving out) and has been using rollator at home.     Hand Dominance        Extremity/Trunk Assessment   Upper Extremity Assessment Upper Extremity Assessment: Generalized weakness    Lower Extremity Assessment Lower Extremity Assessment: Generalized weakness        Communication   Communication: No difficulties  Cognition Arousal/Alertness:  (Initially very drowsy but woke up well with activity) Behavior During Therapy: WFL for tasks assessed/performed Overall Cognitive Status: Within Functional Limits for tasks assessed                      General Comments General comments (skin integrity, edema, etc.): Blood noted when pt was wiping (when toileting); 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  Nursing cleared pt for participation in physical therapy.  Pt agreeable to PT session.    Exercises     Assessment/Plan    PT Assessment Patient needs continued PT services  PT Problem List Decreased strength;Decreased activity tolerance;Decreased balance;Decreased mobility;Pain          PT Treatment Interventions DME instruction;Gait training;Functional mobility training;Therapeutic activities;Therapeutic exercise;Balance training;Patient/family education    PT Goals (Current goals can be found in the Care Plan section)  Acute Rehab PT Goals Patient Stated Goal: to get stronger and stop falling PT Goal Formulation: With patient Time For Goal Achievement: 01/31/17 Potential to Achieve Goals: Fair    Frequency Min 2X/week   Barriers to discharge Decreased caregiver support      Co-evaluation               End of Session Equipment Utilized During Treatment: Gait belt;Oxygen Activity Tolerance: Patient limited by fatigue Patient left: in bed;with call bell/phone within reach;with bed alarm set Nurse Communication: Mobility status;Precautions (Pt's pain status; no BSC in room)         Time: [id number] PT Time Calculation (min) (ACUTE ONLY): 37 min   Charges:   PT Evaluation $PT Eval Low Complexity: 1 Procedure PT Treatments $Gait Training: 8-22 mins $Therapeutic Activity: 8-22 mins   PT G CodesHendricks Limes:        Nikkia Devoss 01/17/2017, 10:48 AM Hendricks LimesEmily Sumiko Ceasar,  PT [phone]

## 2017-01-17 NOTE — [place] FL2 (Signed)
[place] MEDICAID FL2 LEVEL OF CARE SCREENING TOOL     IDENTIFICATION  Patient Name: Jessica DanceBarbara Donovan Birthdate: 12/09/1946 Sex: female Admission Date (Current Location): 01/17/2017  [place]   Facility and Address:  [facility], [address], [place], [place] [address]      Provider Number: [id number]  Attending Physician Name and Address:  Adrian SaranSital Mody, MD  Relative Name and Phone Number:       Current Level of Care: Hospital Recommended Level of Care: Skilled Nursing Facility Prior Approval Number:    Date Approved/Denied:   PASRR Number: [id number]  Discharge Plan: SNF    Current Diagnoses: Patient Active Problem List   Diagnosis Date Noted  . Sepsis (HCC) 01/17/2017  . Cellulitis of fourth toe of right foot 04/10/2016  . Somnolence 04/09/2016  . Dementia 04/09/2016  . Cellulitis 04/03/2016  . Hematoma of right parietal scalp 02/23/2016  . Multiple falls 02/23/2016  . Physical debility 02/23/2016  . DM2 (diabetes mellitus, type 2) (HCC) 02/23/2016  . Syncope and collapse 02/23/2016  . Polypharmacy 02/23/2016  . Osteomyelitis of toe (HCC) 01/23/2016  . Rhabdomyolysis 12/07/2015  . ARF (acute renal failure) (HCC) 11/29/2015  . Hypotension 11/29/2015  . 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 Angina effort (HCC) 11/07/2015  . Osteomyelitis (HCC) 11/07/2015    Orientation RESPIRATION BLADDER Height & Weight     Self, Time, Situation, Place  Normal Continent Weight: 182 lb 12.8 oz (82.9 kg) Height:  5\' 4"  (162.6 cm)  BEHAVIORAL SYMPTOMS/MOOD NEUROLOGICAL BOWEL NUTRITION STATUS      Continent    AMBULATORY STATUS COMMUNICATION OF NEEDS Skin   Extensive Assist Verbally Bruising (Significant facial bruising from a fall)                       Personal Care Assistance Level of Assistance  Bathing, Dressing, Feeding Bathing Assistance: Limited assistance Feeding assistance: Independent Dressing  Assistance: Limited assistance     Functional Limitations Info             SPECIAL CARE FACTORS FREQUENCY  PT (By licensed PT), OT (By licensed OT)     PT Frequency: Up to 5X a day, 5 days a week OT Frequency: Up to 5X a day, 5 days a week            Contractures Contractures Info: Present    Additional Factors Info  Code Status, Allergies Code Status Info: DNR Allergies Info: Codeine, Influenza Vaccines, Methadone, Percocet Oxycodone-acetaminophen, Tetanus Toxoids, Tetracyclines & Related           Current Medications (01/17/2017):  This is the current hospital active medication list Current Facility-Administered Medications  Medication Dose Route Frequency Provider Last Rate Last Dose  . 0.9 %  sodium chloride infusion   Intravenous Continuous Adrian SaranSital Mody, MD 75 mL/hr at 01/17/17 0710    . acetaminophen (TYLENOL) tablet 650 mg  650 mg Oral Q6H PRN Arnaldo NatalMichael S Diamond, MD       Or  . acetaminophen (TYLENOL) suppository 650 mg  650 mg Rectal Q6H PRN Arnaldo NatalMichael S Diamond, MD      . ALPRAZolam Prudy Feeler(XANAX) tablet 1 mg  1 mg Oral QHS PRN Arnaldo NatalMichael S Diamond, MD      . aspirin EC tablet 81 mg  81 mg Oral Daily Arnaldo NatalMichael S Diamond, MD   81 mg at 01/17/17 [phone]  . atorvastatin (LIPITOR) tablet 20 mg  20 mg Oral Daily Kelton PillarMichael S  Sheryle Hail, MD      . Melene Muller ON 01/18/2017] cefTRIAXone (ROCEPHIN) IVPB 1 g  1 g Intravenous Q24H Arnaldo Natal, MD      . cyclobenzaprine (FLEXERIL) tablet 10 mg  10 mg Oral TID PRN Arnaldo Natal, MD      . docusate sodium (COLACE) capsule 100 mg  100 mg Oral BID Arnaldo Natal, MD   100 mg at 01/17/17 9604  . enoxaparin (LOVENOX) injection 40 mg  40 mg Subcutaneous Q24H Arnaldo Natal, MD      . ferrous sulfate tablet 325 mg  325 mg Oral Q breakfast Arnaldo Natal, MD   325 mg at 01/17/17 5409  . fesoterodine (TOVIAZ) tablet 4 mg  4 mg Oral Daily Arnaldo Natal, MD   4 mg at 01/17/17 8119  . furosemide (LASIX) tablet 20 mg  20 mg Oral Daily Arnaldo Natal, MD   20 mg at 01/17/17 1478  . insulin aspart (novoLOG) injection 0-15 Units  0-15 Units Subcutaneous TID WC Adrian Saran, MD   5 Units at 01/17/17 1220  . insulin aspart (novoLOG) injection 0-5 Units  0-5 Units Subcutaneous QHS Sital Mody, MD      . insulin glargine (LANTUS) injection 36 Units  36 Units Subcutaneous QHS Arnaldo Natal, MD      . isosorbide mononitrate (IMDUR) 24 hr tablet 30 mg  30 mg Oral Daily Arnaldo Natal, MD   30 mg at 01/17/17 2956  . lisinopril (PRINIVIL,ZESTRIL) tablet 20 mg  20 mg Oral Daily Arnaldo Natal, MD   20 mg at 01/17/17 2130  . metoprolol tartrate (LOPRESSOR) tablet 25 mg  25 mg Oral BID Arnaldo Natal, MD   25 mg at 01/17/17 8657  . mirtazapine (REMERON) tablet 15 mg  15 mg Oral QHS Arnaldo Natal, MD      . morphine (MS CONTIN) 12 hr tablet 30 mg  30 mg Oral BID Arnaldo Natal, MD   30 mg at 01/17/17 8469  . morphine (MSIR) tablet 15 mg  15 mg Oral Q6H PRN Arnaldo Natal, MD      . multivitamin with minerals tablet 1 tablet  1 tablet Oral Daily Arnaldo Natal, MD   1 tablet at 01/17/17 [phone]  . nitroGLYCERIN (NITROSTAT) SL tablet 0.4 mg  0.4 mg Sublingual Q5 min PRN Arnaldo Natal, MD      . ondansetron [facility]) tablet 4 mg  4 mg Oral Q6H PRN Arnaldo Natal, MD       Or  . ondansetron [facility]) injection 4 mg  4 mg Intravenous Q6H PRN Arnaldo Natal, MD      . pantoprazole (PROTONIX) EC tablet 40 mg  40 mg Oral Daily Arnaldo Natal, MD   40 mg at 01/17/17 2841  . sodium chloride flush (NS) 0.9 % injection 3 mL  3 mL Intravenous Q12H Arnaldo Natal, MD      . ticagrelor [facility]) tablet 90 mg  90 mg Oral BID Arnaldo Natal, MD   90 mg at 01/17/17 3244  . venlafaxine XR (EFFEXOR-XR) 24 hr capsule 150 mg  150 mg Oral Q breakfast Arnaldo Natal, MD   150 mg at 01/17/17 0902  . vitamin B-12 (CYANOCOBALAMIN) tablet 1,000 mcg  1,000 mcg Oral Daily Arnaldo Natal, MD   1,000 mcg at 01/17/17 0102  . Vitamin D  (Ergocalciferol) (DRISDOL) capsule 50,000 Units  50,000 Units Oral Q7 days  Arnaldo Natal, MD   50,000 Units at 01/17/17 1220     Discharge Medications: Please see discharge summary for a list of discharge medications.  Relevant Imaging Results:  Relevant Lab Results:   Additional Information SS# [id number]  Judi Cong, LCSW

## 2017-01-17 NOTE — Clinical Social Work Note (Signed)
Clinical Social Work Assessment  Patient Details  Name: Jessica Donovan MRN: [id number] Date of Birth: March 12, 1946  Date of referral:  01/17/17               Reason for consult:  Facility Placement                Permission sought to share information with:  [facility]cean[facility]ility Contact Representative Permission granted to share information::  Yes, Verbal Permission Granted  Name::        Agency::     Relationship::     Contact Information:     Housing/Transportation Living arrangements for the past 2 months:  Single Family Home Source of Information:  Patient Patient Interpreter Needed:  None Criminal Activity/Legal Involvement Pertinent to Current Situation/Hospitalization:  No - Comment as needed Significant Relationships:  Friend Lives with:  Self Do you feel safe going back to the place where you live?  Yes Need for family participation in patient care:  No (Coment)  Care giving concerns:  PT recommendation for SNF   Social Worker assessment / plan:  CSW visited patient at bedside to discuss dc planning. The patient gave verbal permission to conduct a bed search and understood that she may be in copay days. The patient became tearful when discussing family discord. The patient seems particularly lonely and presented with significant sadness about her continued falls and her family's lack of communication. She has a sister and a daughter. Her husband passed away 7.5 years ago, and she reported that she enjoys recounting memories from their marriage.   The patient indicated [facility] is her preference. She lives alone in a single family home.   Employment status:  Retired Database administratornsurance information:  Managed Medicare PT Recommendations:  Skilled Nursing Facility Information / Referral to community resources:  Skilled Nursing Facility  Patient/Family's Response to care:  Patient thanked CSW for emotional support.  Patient/Family's Understanding of and Emotional Response to Diagnosis,  Current Treatment, and Prognosis:  Patient is in agreement with the treatment plan.  Emotional Assessment Appearance:  Appears stated age Attitude/Demeanor/Rapport:  Crying Affect (typically observed):  Accepting, Sad Orientation:  Oriented to Self, Oriented to Place, Oriented to  Time, Oriented to Situation Alcohol / Substance use:  Never Used Psych involvement (Current and /or in the community):  No (Comment)  Discharge Needs  Concerns to be addressed:  Care Coordination Readmission within the last 30 days:  No Current discharge risk:  Lives alone Barriers to Discharge:  Continued Medical Work up   [employer] CorporationKaren M Lashaunda Schild, LCSW 01/17/2017, 4:07 PM

## 2017-01-17 NOTE — Clinical Social Work Note (Signed)
CSW received consult for SNF. CSW will watch and assess pending PT/OT recommendations.  Argentina PonderKaren Martha Quinisha Mould, MSW, Theresia MajorsLCSWA [phone]

## 2017-01-17 NOTE — Progress Notes (Addendum)
Pharmacy Antibiotic Note  Jessica DanceBarbara Donovan is a 71 y.o. female admitted on 01/17/2017 with sepsis.  Pharmacy has been consulted for vancomycin dosing.  Plan: DW 69kg  Vd 48L kei  0.041 hr-1  T1/2 17 hours Vancomycin 1 gram q 18 hours ordered with stacked dosing. Level before 5th dose. Goal trough 15-20.   Height: 5\' 4"  (162.6 cm) Weight: 200 lb (90.7 kg) IBW/kg (Calculated) : 54.7  Temp (24hrs), Avg:98.7 F (37.1 C), Min:98.2 F (36.8 C), Max:99.2 F (37.3 C)   Recent Labs Lab 01/17/17 0027  WBC 14.5*  CREATININE 1.31*    Estimated Creatinine Clearance: 43.6 mL/min (by C-G formula based on SCr of 1.31 mg/dL (H)).    Allergies  Allergen Reactions  . Codeine Nausea And Vomiting  . Influenza Vaccines Other (See Comments)    Reaction:  Caused pt to pass out   . Methadone Hives and Itching  . Percocet [Oxycodone-Acetaminophen] Nausea And Vomiting  . Tetanus Toxoids Swelling and Other (See Comments)    Reaction:  Swelling at injection site  . Tetracyclines & Related Rash    Antimicrobials this admission: ceftriaxone 1/28 >>  vancomycin 1/28 >>   Dose adjustments this admission:   Microbiology results: 1/28 BCx: pending 1/28 UCx: pending      1/28 CXR: no acute disease 1/28 UA: LE(+) NO2(-) WBC 6-30  Thank you for allowing pharmacy to be a part of this patient's care.  Jazminn Pomales S 01/17/2017 4:53 AM

## 2017-01-18 ENCOUNTER — Encounter: Payer: Self-pay | Admitting: Internal Medicine

## 2017-01-18 LAB — BLOOD CULTURE ID PANEL (REFLEXED)
ACINETOBACTER BAUMANNII: NOT DETECTED
CANDIDA GLABRATA: NOT DETECTED
CANDIDA KRUSEI: NOT DETECTED
Candida albicans: NOT DETECTED
Candida parapsilosis: NOT DETECTED
Candida tropicalis: NOT DETECTED
ENTEROBACTER CLOACAE COMPLEX: NOT DETECTED
ENTEROCOCCUS SPECIES: NOT DETECTED
ESCHERICHIA COLI: NOT DETECTED
Enterobacteriaceae species: NOT DETECTED
Haemophilus influenzae: NOT DETECTED
Klebsiella oxytoca: NOT DETECTED
Klebsiella pneumoniae: NOT DETECTED
LISTERIA MONOCYTOGENES: NOT DETECTED
Methicillin resistance: DETECTED — AB
NEISSERIA MENINGITIDIS: NOT DETECTED
PROTEUS SPECIES: NOT DETECTED
PSEUDOMONAS AERUGINOSA: NOT DETECTED
STAPHYLOCOCCUS AUREUS BCID: NOT DETECTED
STAPHYLOCOCCUS SPECIES: DETECTED — AB
STREPTOCOCCUS AGALACTIAE: NOT DETECTED
STREPTOCOCCUS PNEUMONIAE: NOT DETECTED
STREPTOCOCCUS SPECIES: NOT DETECTED
Serratia marcescens: NOT DETECTED
Streptococcus pyogenes: NOT DETECTED

## 2017-01-18 LAB — URINE CULTURE: Culture: NO GROWTH

## 2017-01-18 LAB — GLUCOSE, CAPILLARY
Glucose-Capillary: 97 mg/dL (ref 65–99)
Glucose-Capillary: 213 mg/dL — ABNORMAL HIGH (ref 65–99)
Glucose-Capillary: 107 mg/dL — ABNORMAL HIGH (ref 65–99)
Glucose-Capillary: 114 mg/dL — ABNORMAL HIGH (ref 65–99)

## 2017-01-18 LAB — HEMOGLOBIN A1C
Hgb A1c MFr Bld: 6.7 % — ABNORMAL HIGH (ref 4.8–5.6)
Mean Plasma Glucose: 146 mg/dL

## 2017-01-18 LAB — MRSA PCR SCREENING: MRSA by PCR: POSITIVE — AB

## 2017-01-18 MED ORDER — MUPIROCIN 2 % EX OINT
1.0000 | TOPICAL_OINTMENT | Freq: Two times a day (BID) | CUTANEOUS | Status: DC
Start: 2017-01-18 — End: 2017-01-19
  Administered 2017-01-18 – 2017-01-19 (×2): 1 via NASAL
  Filled 2017-01-18: qty 22

## 2017-01-18 MED ORDER — ALPRAZOLAM 0.25 MG PO TABS
0.2500 mg | ORAL_TABLET | Freq: Three times a day (TID) | ORAL | Status: DC | PRN
Start: 2017-01-18 — End: 2017-01-19
  Administered 2017-01-18: 0.25 mg via ORAL
  Filled 2017-01-18: qty 1

## 2017-01-18 MED ORDER — VANCOMYCIN HCL IN DEXTROSE 750-5 MG/150ML-% IV SOLN
750.0000 mg | Freq: Two times a day (BID) | INTRAVENOUS | Status: DC
  Administered 2017-01-18: 750 mg via INTRAVENOUS
  Filled 2017-01-18 (×2): qty 150

## 2017-01-18 MED ORDER — POLYVINYL ALCOHOL 1.4 % OP SOLN
1.0000 [drp] | OPHTHALMIC | Status: DC | PRN
  Administered 2017-01-19: 1 [drp] via OPHTHALMIC
  Filled 2017-01-18: qty 15

## 2017-01-18 MED ORDER — CHLORHEXIDINE GLUCONATE CLOTH 2 % EX PADS: 6.0000 | MEDICATED_PAD | Freq: Every day | CUTANEOUS | Status: DC

## 2017-01-18 NOTE — Clinical Social Work Placement (Signed)
   CLINICAL SOCIAL WORK PLACEMENT  NOTE  Date:  01/18/2017  Patient Details  Name: Jessica DanceBarbara Donovan MRN: [id number] Date of Birth: 05/30/1946  Clinical Social Work is seeking post-discharge placement for this patient at the Skilled  Nursing Facility level of care (*CSW will initial, date and re-position this form in  chart as items are completed):  Yes   Patient/family provided with [facility] Clinical Social Work Department's list of facilities offering this level of care within the geographic area requested by the patient (or if unable, by the patient's family).  Yes   Patient/family informed of their freedom to choose among providers that offer the needed level of care, that participate in Medicare, Medicaid or managed care program needed by the patient, have an available bed and are willing to accept the patient.  Yes   Patient/family informed of [facility] ownership interest in [facility] and [facility], as well as of the fact that they are under no obligation to receive care at these facilities.  PASRR submitted to EDS on       PASRR number received on       Existing PASRR number confirmed on 01/17/17     FL2 transmitted to all facilities in geographic area requested by pt/family on 01/17/17     FL2 transmitted to all facilities within larger geographic area on       Patient informed that his/her managed care company has contracts with or will negotiate with certain facilities, including the following:        Yes   Patient/family informed of bed offers received.  Patient chooses bed at  [name]     Physician recommends and patient chooses bed at      Patient to be transferred to   on  .  Patient to be transferred to facility by       Patient family notified on   of transfer.  Name of family member notified:        PHYSICIAN       Additional Comment:    _______________________________________________ [name], [name], LCSW 01/18/2017, 3:10  PM

## 2017-01-18 NOTE — Progress Notes (Signed)
Pharmacy Antibiotic Note  Jessica Donovan is a [age] y.o. female admitted on 01/17/2017 with sepsis.  Pharmacy has been consulted for vancomycin dosing.  Plan: DW 69kg  Vd 48L kei  0.041 hr-1  T1/2 17 hours Vancomycin 1 gram q 18 hours ordered with stacked dosing. Level before 5th dose. Goal trough 15-20.   Height: 5\' 4"  (162.6 cm) Weight: 182 lb 12.8 oz (82.9 kg) IBW/kg (Calculated) : 54.7  Temp (24hrs), Avg:98.7 F (37.1 C), Min:98.2 F (36.8 C), Max:99.5 F (37.5 C)   Recent Labs Lab 01/17/17 0027 01/17/17 0718  WBC 14.5* 12.4*  CREATININE 1.31* 0.79    Estimated Creatinine Clearance: 68.2 mL/min (by C-G formula based on SCr of 0.79 mg/dL).    Allergies  Allergen Reactions  . Codeine Nausea And Vomiting  . Influenza Vaccines Other (See Comments)    Reaction:  Caused pt to pass out   . Methadone Hives and Itching  . Percocet [Oxycodone-Acetaminophen] Nausea And Vomiting  . Tetanus Toxoids Swelling and Other (See Comments)    Reaction:  Swelling at injection site  . Tetracyclines & Related Rash    Antimicrobials this admission: ceftriaxone 1/28 >>  vancomycin 1/28 >>   Dose adjustments this admission: 1/29 vancomycin restarted d/t positive BCID. 750 mg q 12 hours with level before 4th dose after restart. kei 0.061  T1/2 11 hours DW 66kg  Vd 4L   Microbiology results: 1/28 BCx: pending 1/28 UCx: pending  1/29 BCID Staph spp. mec A(+)     1/28 CXR: no acute disease 1/28 UA: LE(+) NO2(-) WBC 6-30  Thank you for allowing pharmacy to be a part of this patient's care.  Robbie Rideaux S 01/18/2017 1:35 AM

## 2017-01-18 NOTE — Progress Notes (Signed)
Clinical Child psychotherapistocial Worker (CSW) presented bed offers to patient and she chose [facility]. CSW discussed long term care options with patient including private pay and medicaid. Health [employer] authorization has been received. Auth # [id number]. [facility] is aware of accepted bed offer. CSW will continue to follow and assist as needed.   [employer] Hughes IncorporatedBailey Dalena Plantz, LCSW [phone]

## 2017-01-18 NOTE — Progress Notes (Addendum)
OT Cancellation Note  Patient Details Name: Jessica DanceBarbara Capizzi MRN: [id number] DOB: 03-03-1946   Cancelled Treatment:    Reason Eval/Treat Not Completed: Patient declined, no reason specified. Order received, chart reviewed. Pt declined OT services despite encouragement and education in benefits of OT to address activity tolerance, safety, self care, falls prevention, and energy conservation. Pt stated, "I've done OT before and I know how to use equipment and all that. I am too weak right now to do anything. Please, I really just don't want to." OT will complete the order. Please re-consult if OT evaluation is needed for discharge planning.   Eliezer BottomJamie L Stiller, OTR/L 01/18/2017, 9:06 AM

## 2017-01-18 NOTE — Progress Notes (Signed)
[facility] - [place] [facility]   PATIENT NAME: Jessica Donovan    MR#:  [id number]  DATE OF BIRTH:  January 27, 1946  SUBJECTIVE:  CHIEF COMPLAINT:   Chief Complaint  Patient presents with  . Fall   Started having diarrhea since yesterday evening. 6 watery stools. Nausea. No vomiting. Mild abdominal cramping.  REVIEW OF SYSTEMS:    Review of Systems  Constitutional: Positive for malaise/fatigue. Negative for chills and fever.  HENT: Negative for sore throat.   Eyes: Negative for blurred vision, double vision and pain.  Respiratory: Negative for cough, hemoptysis, shortness of breath and wheezing.   Cardiovascular: Negative for chest pain, palpitations, orthopnea and leg swelling.  Gastrointestinal: Positive for diarrhea and nausea. Negative for abdominal pain, constipation, heartburn and vomiting.  Genitourinary: Negative for dysuria and hematuria.  Musculoskeletal: Positive for back pain. Negative for joint pain.  Skin: Negative for rash.  Neurological: Positive for weakness. Negative for sensory change, speech change, focal weakness and headaches.  Endo/Heme/Allergies: Does not bruise/bleed easily.  Psychiatric/Behavioral: Negative for depression. The patient is not nervous/anxious.     DRUG ALLERGIES:   Allergies  Allergen Reactions  . Codeine Nausea And Vomiting  . Influenza Vaccines Other (See Comments)    Reaction:  Caused pt to pass out   . Methadone Hives and Itching  . Percocet [Oxycodone-Acetaminophen] Nausea And Vomiting  . Tetanus Toxoids Swelling and Other (See Comments)    Reaction:  Swelling at injection site  . Tetracyclines & Related Rash    VITALS:  Blood pressure (!) 122/51, pulse 85, temperature 98.3 F (36.8 C), temperature source Oral, resp. rate 16, height 5\' 4"  (1.626 m), weight 85.2 kg (187 lb 12.8 oz), SpO2 93 %.  PHYSICAL EXAMINATION:   Physical Exam  GENERAL:  71 y.o.-year-old patient lying in the bed with no acute  distress.  EYES: Pupils equal, round, reactive to light and accommodation. No scleral icterus. Extraocular muscles intact.  HEENT: Head atraumatic, normocephalic. Oropharynx and nasopharynx clear.  NECK:  Supple, no jugular venous distention. No thyroid enlargement, no tenderness.  LUNGS: Normal breath sounds bilaterally, no wheezing, rales, rhonchi. No use of accessory muscles of respiration.  CARDIOVASCULAR: S1, S2 normal. No murmurs, rubs, or gallops.  ABDOMEN: Soft, nontender, nondistended. Bowel sounds present. No organomegaly or mass.  EXTREMITIES: No cyanosis, clubbing or edema b/l.    NEUROLOGIC: Cranial nerves II through XII are intact. No focal Motor or sensory deficits b/l.   PSYCHIATRIC: The patient is alert and oriented x 3.  SKIN: No obvious rash, lesion, or ulcer.   LABORATORY PANEL:   CBC  Recent Labs Lab 01/17/17 0718  WBC 12.4*  HGB 10.8*  HCT 31.8*  PLT 217   ------------------------------------------------------------------------------------------------------------------ Chemistries   Recent Labs Lab 01/17/17 0718  NA 132*  K 4.0  CL 102  CO2 23  GLUCOSE 204*  BUN 21*  CREATININE 0.79  CALCIUM 8.2*   ------------------------------------------------------------------------------------------------------------------  Cardiac Enzymes  Recent Labs Lab 01/17/17 0027  TROPONINI <0.03   ------------------------------------------------------------------------------------------------------------------  RADIOLOGY:  Dg Chest 1 View  Result Date: 01/17/2017 CLINICAL DATA:  71 year old female with head injury. EXAM: CHEST 1 VIEW COMPARISON:  Chest radiograph dated 04/08/2016 FINDINGS: There is shallow inspiration. Left lung base streaky densities appear to improve with better inspiration and most consistent with atelectatic changes. There is no focal consolidation, pleural effusion, or pneumothorax. The cardiac silhouette is within normal limits. There is  osteopenia with degenerative changes of the spine. No acute osseous pathology.  IMPRESSION: No acute cardiopulmonary process. Shallow inspiration with left lung base atelectatic changes. Electronically Signed   By: Elgie Collard M.D.   On: 01/17/2017 02:00   Ct Head Wo Contrast  Result Date: 01/17/2017 CLINICAL DATA:  Initial valuation for recent falls. Bruising on right side of face. EXAM: CT HEAD WITHOUT CONTRAST CT MAXILLOFACIAL WITHOUT CONTRAST CT CERVICAL SPINE WITHOUT CONTRAST TECHNIQUE: Multidetector CT imaging of the head, cervical spine, and maxillofacial structures were performed using the standard protocol without intravenous contrast. Multiplanar CT image reconstructions of the cervical spine and maxillofacial structures were also generated. COMPARISON:  None. FINDINGS: CT HEAD FINDINGS Brain: Generalized age-related cerebral atrophy with chronic microvascular ischemic disease. No acute intracranial hemorrhage. No evidence for acute large vessel territory infarct. No mass lesion, midline shift or mass effect. No hydrocephalus. No extra-axial fluid collection. Vascular: No hyperdense vessel. Scattered vascular calcifications noted within the carotid siphons. Skull: Scalp soft tissues demonstrate no acute abnormality. Calvarium intact. Other: Mastoids are clear. CT MAXILLOFACIAL FINDINGS Osseous: Zygomatic arches intact. No acute maxillary fracture. Pterygoid plates intact. No acute nasal bone fracture. Nasal septum midline intact. No acute mandibular fracture. Mandibular condyles normally situated. No acute abnormality about the dentition. Orbits: Globes intact. Patient is status post lens extraction bilaterally. No retro-orbital hematoma or other pathology. Bony orbits intact. Sinuses: Clear. Soft tissues: Right facial contusion with superimposed 11 x 14 x 10 mm hematoma. No other significant soft tissue swelling within the face. CT CERVICAL SPINE FINDINGS Alignment: Study degraded by motion  artifact. Vertebral bodies normally aligned with preservation of the normal cervical lordosis. No listhesis or subluxation. Skull base and vertebrae: Skullbase intact. Normal C1-2 articulations preserved. Dens is intact. Vertebral body heights maintained. No acute fracture. Soft tissues and spinal canal: Visualized soft tissues of the neck demonstrate no acute abnormality. Vascular calcifications noted about the carotid bifurcations. No prevertebral edema. Disc levels: Moderate degenerate spondylolysis noted at C5-6. Multilevel facet arthrosis, most notable at C3-4 on the left. Upper chest: Visualized upper chest demonstrates no acute abnormality. Visualized lung apices are clear. IMPRESSION: 1. No acute intracranial process identified. 2. Small right facial contusion as above. No other acute maxillofacial injury identified. No fracture. 3. No acute traumatic injury within cervical spine. Electronically Signed   By: Rise Mu M.D.   On: 01/17/2017 02:12   Ct Cervical Spine Wo Contrast  Result Date: 01/17/2017 CLINICAL DATA:  Initial valuation for recent falls. Bruising on right side of face. EXAM: CT HEAD WITHOUT CONTRAST CT MAXILLOFACIAL WITHOUT CONTRAST CT CERVICAL SPINE WITHOUT CONTRAST TECHNIQUE: Multidetector CT imaging of the head, cervical spine, and maxillofacial structures were performed using the standard protocol without intravenous contrast. Multiplanar CT image reconstructions of the cervical spine and maxillofacial structures were also generated. COMPARISON:  None. FINDINGS: CT HEAD FINDINGS Brain: Generalized age-related cerebral atrophy with chronic microvascular ischemic disease. No acute intracranial hemorrhage. No evidence for acute large vessel territory infarct. No mass lesion, midline shift or mass effect. No hydrocephalus. No extra-axial fluid collection. Vascular: No hyperdense vessel. Scattered vascular calcifications noted within the carotid siphons. Skull: Scalp soft tissues  demonstrate no acute abnormality. Calvarium intact. Other: Mastoids are clear. CT MAXILLOFACIAL FINDINGS Osseous: Zygomatic arches intact. No acute maxillary fracture. Pterygoid plates intact. No acute nasal bone fracture. Nasal septum midline intact. No acute mandibular fracture. Mandibular condyles normally situated. No acute abnormality about the dentition. Orbits: Globes intact. Patient is status post lens extraction bilaterally. No retro-orbital hematoma or other pathology. Bony orbits intact. Sinuses: Clear. Soft  tissues: Right facial contusion with superimposed 11 x 14 x 10 mm hematoma. No other significant soft tissue swelling within the face. CT CERVICAL SPINE FINDINGS Alignment: Study degraded by motion artifact. Vertebral bodies normally aligned with preservation of the normal cervical lordosis. No listhesis or subluxation. Skull base and vertebrae: Skullbase intact. Normal C1-2 articulations preserved. Dens is intact. Vertebral body heights maintained. No acute fracture. Soft tissues and spinal canal: Visualized soft tissues of the neck demonstrate no acute abnormality. Vascular calcifications noted about the carotid bifurcations. No prevertebral edema. Disc levels: Moderate degenerate spondylolysis noted at C5-6. Multilevel facet arthrosis, most notable at C3-4 on the left. Upper chest: Visualized upper chest demonstrates no acute abnormality. Visualized lung apices are clear. IMPRESSION: 1. No acute intracranial process identified. 2. Small right facial contusion as above. No other acute maxillofacial injury identified. No fracture. 3. No acute traumatic injury within cervical spine. Electronically Signed   By: Rise MuBenjamin  McClintock M.D.   On: 01/17/2017 02:12   Ct Maxillofacial Wo Contrast  Result Date: 01/17/2017 CLINICAL DATA:  Initial valuation for recent falls. Bruising on right side of face. EXAM: CT HEAD WITHOUT CONTRAST CT MAXILLOFACIAL WITHOUT CONTRAST CT CERVICAL SPINE WITHOUT CONTRAST  TECHNIQUE: Multidetector CT imaging of the head, cervical spine, and maxillofacial structures were performed using the standard protocol without intravenous contrast. Multiplanar CT image reconstructions of the cervical spine and maxillofacial structures were also generated. COMPARISON:  None. FINDINGS: CT HEAD FINDINGS Brain: Generalized age-related cerebral atrophy with chronic microvascular ischemic disease. No acute intracranial hemorrhage. No evidence for acute large vessel territory infarct. No mass lesion, midline shift or mass effect. No hydrocephalus. No extra-axial fluid collection. Vascular: No hyperdense vessel. Scattered vascular calcifications noted within the carotid siphons. Skull: Scalp soft tissues demonstrate no acute abnormality. Calvarium intact. Other: Mastoids are clear. CT MAXILLOFACIAL FINDINGS Osseous: Zygomatic arches intact. No acute maxillary fracture. Pterygoid plates intact. No acute nasal bone fracture. Nasal septum midline intact. No acute mandibular fracture. Mandibular condyles normally situated. No acute abnormality about the dentition. Orbits: Globes intact. Patient is status post lens extraction bilaterally. No retro-orbital hematoma or other pathology. Bony orbits intact. Sinuses: Clear. Soft tissues: Right facial contusion with superimposed 11 x 14 x 10 mm hematoma. No other significant soft tissue swelling within the face. CT CERVICAL SPINE FINDINGS Alignment: Study degraded by motion artifact. Vertebral bodies normally aligned with preservation of the normal cervical lordosis. No listhesis or subluxation. Skull base and vertebrae: Skullbase intact. Normal C1-2 articulations preserved. Dens is intact. Vertebral body heights maintained. No acute fracture. Soft tissues and spinal canal: Visualized soft tissues of the neck demonstrate no acute abnormality. Vascular calcifications noted about the carotid bifurcations. No prevertebral edema. Disc levels: Moderate degenerate  spondylolysis noted at C5-6. Multilevel facet arthrosis, most notable at C3-4 on the left. Upper chest: Visualized upper chest demonstrates no acute abnormality. Visualized lung apices are clear. IMPRESSION: 1. No acute intracranial process identified. 2. Small right facial contusion as above. No other acute maxillofacial injury identified. No fracture. 3. No acute traumatic injury within cervical spine. Electronically Signed   By: Rise MuBenjamin  McClintock M.D.   On: 01/17/2017 02:12     ASSESSMENT AND PLAN:   This is a 71 year old female admitted for sepsis.  * diarrhea Check stool for C. Difficile and stool PCR. Symptomatic treatment at this time.  * sepsis present on admission likely due to UTI. Urine cultures pending. On IV antibiotics. Sepsis resolved.  Blood cultures have methicillin resistant staph species. Not staph  aureus. Likely contaminant.  * acute encephalopathy on admission due to narcotic medications and acute illness. This is resolved.  3. CAD: Stable; continue aspirin, Brillinta and Imdur  4. Hypertension: Controlled; continue lisinopril and metoprolol  5. Diabetes mellitus type 2 Continue basal insulin adjusted for hospital diet; sliding scale insulin while hospitalized  *  Dementia: Continue mirtazapine and Effexor  * Urinary incontinence: Continue tolterodine  All the records are reviewed and case discussed with Care Management/Social Workerr. Management plans discussed with the patient, family and they are in agreement.  CODE STATUS: FULL CODE  DVT Prophylaxis: SCDs  TOTAL TIME TAKING CARE OF THIS PATIENT: 35 minutes.   POSSIBLE D/C IN 1-2 DAYS, DEPENDING ON CLINICAL CONDITION the skilled nursing facility  Milagros Loll R M.D on 01/18/2017 at 2:10 PM  Between 7am to 6pm - Pager - [phone]  After 6pm go to [email or web address] - password [email or web address] [facility]  [facility] [place] [facility]  Office  [phone]  CC: Primary care physician; Lauro Regulus.,  MD  Note: This dictation was prepared with Dragon dictation along with smaller phrase technology. Any transcriptional errors that result from this process are unintentional.

## 2017-01-19 ENCOUNTER — Inpatient Hospital Stay: Payer: PPO

## 2017-01-19 DIAGNOSIS — W010XXA Fall on same level from slipping, tripping and stumbling without subsequent striking against object, initial encounter: Secondary | ICD-10-CM | POA: Diagnosis not present

## 2017-01-19 DIAGNOSIS — E119 Type 2 diabetes mellitus without complications: Secondary | ICD-10-CM | POA: Diagnosis not present

## 2017-01-19 DIAGNOSIS — Z89422 Acquired absence of other left toe(s): Secondary | ICD-10-CM | POA: Diagnosis not present

## 2017-01-19 DIAGNOSIS — I2510 Atherosclerotic heart disease of native coronary artery without angina pectoris: Secondary | ICD-10-CM | POA: Diagnosis not present

## 2017-01-19 DIAGNOSIS — F329 Major depressive disorder, single episode, unspecified: Secondary | ICD-10-CM | POA: Diagnosis not present

## 2017-01-19 DIAGNOSIS — W19XXXA Unspecified fall, initial encounter: Secondary | ICD-10-CM | POA: Diagnosis not present

## 2017-01-19 DIAGNOSIS — Y999 Unspecified external cause status: Secondary | ICD-10-CM | POA: Diagnosis not present

## 2017-01-19 DIAGNOSIS — I10 Essential (primary) hypertension: Secondary | ICD-10-CM | POA: Diagnosis not present

## 2017-01-19 DIAGNOSIS — R197 Diarrhea, unspecified: Secondary | ICD-10-CM | POA: Diagnosis not present

## 2017-01-19 DIAGNOSIS — K529 Noninfective gastroenteritis and colitis, unspecified: Secondary | ICD-10-CM | POA: Diagnosis present

## 2017-01-19 DIAGNOSIS — G8929 Other chronic pain: Secondary | ICD-10-CM | POA: Diagnosis not present

## 2017-01-19 DIAGNOSIS — R109 Unspecified abdominal pain: Secondary | ICD-10-CM | POA: Diagnosis not present

## 2017-01-19 DIAGNOSIS — E039 Hypothyroidism, unspecified: Secondary | ICD-10-CM | POA: Diagnosis not present

## 2017-01-19 DIAGNOSIS — E785 Hyperlipidemia, unspecified: Secondary | ICD-10-CM | POA: Diagnosis not present

## 2017-01-19 DIAGNOSIS — Z79899 Other long term (current) drug therapy: Secondary | ICD-10-CM | POA: Diagnosis not present

## 2017-01-19 DIAGNOSIS — I252 Old myocardial infarction: Secondary | ICD-10-CM | POA: Diagnosis not present

## 2017-01-19 DIAGNOSIS — W19XXXD Unspecified fall, subsequent encounter: Secondary | ICD-10-CM | POA: Diagnosis not present

## 2017-01-19 DIAGNOSIS — G9340 Encephalopathy, unspecified: Secondary | ICD-10-CM | POA: Diagnosis not present

## 2017-01-19 DIAGNOSIS — S0990XA Unspecified injury of head, initial encounter: Secondary | ICD-10-CM | POA: Diagnosis not present

## 2017-01-19 DIAGNOSIS — S199XXA Unspecified injury of neck, initial encounter: Secondary | ICD-10-CM | POA: Diagnosis not present

## 2017-01-19 DIAGNOSIS — Z743 Need for continuous supervision: Secondary | ICD-10-CM | POA: Diagnosis not present

## 2017-01-19 DIAGNOSIS — Z8673 Personal history of transient ischemic attack (TIA), and cerebral infarction without residual deficits: Secondary | ICD-10-CM | POA: Diagnosis not present

## 2017-01-19 DIAGNOSIS — Y92002 Bathroom of unspecified non-institutional (private) residence single-family (private) house as the place of occurrence of the external cause: Secondary | ICD-10-CM | POA: Diagnosis not present

## 2017-01-19 DIAGNOSIS — M549 Dorsalgia, unspecified: Secondary | ICD-10-CM | POA: Diagnosis not present

## 2017-01-19 DIAGNOSIS — S0101XA Laceration without foreign body of scalp, initial encounter: Secondary | ICD-10-CM | POA: Diagnosis not present

## 2017-01-19 DIAGNOSIS — Z7401 Bed confinement status: Secondary | ICD-10-CM | POA: Diagnosis not present

## 2017-01-19 DIAGNOSIS — M62838 Other muscle spasm: Secondary | ICD-10-CM | POA: Diagnosis not present

## 2017-01-19 DIAGNOSIS — S0990XD Unspecified injury of head, subsequent encounter: Secondary | ICD-10-CM | POA: Diagnosis not present

## 2017-01-19 DIAGNOSIS — R296 Repeated falls: Secondary | ICD-10-CM | POA: Diagnosis not present

## 2017-01-19 DIAGNOSIS — Y939 Activity, unspecified: Secondary | ICD-10-CM | POA: Diagnosis not present

## 2017-01-19 DIAGNOSIS — N390 Urinary tract infection, site not specified: Secondary | ICD-10-CM | POA: Diagnosis not present

## 2017-01-19 DIAGNOSIS — Z7982 Long term (current) use of aspirin: Secondary | ICD-10-CM | POA: Diagnosis not present

## 2017-01-19 DIAGNOSIS — F0390 Unspecified dementia without behavioral disturbance: Secondary | ICD-10-CM | POA: Diagnosis not present

## 2017-01-19 DIAGNOSIS — Z794 Long term (current) use of insulin: Secondary | ICD-10-CM | POA: Diagnosis not present

## 2017-01-19 DIAGNOSIS — G894 Chronic pain syndrome: Secondary | ICD-10-CM | POA: Diagnosis not present

## 2017-01-19 DIAGNOSIS — A419 Sepsis, unspecified organism: Secondary | ICD-10-CM | POA: Diagnosis not present

## 2017-01-19 LAB — GLUCOSE, CAPILLARY
Glucose-Capillary: 83 mg/dL (ref 65–99)
Glucose-Capillary: 134 mg/dL — ABNORMAL HIGH (ref 65–99)

## 2017-01-19 MED ORDER — MORPHINE SULFATE ER 30 MG PO TBEA: 30.0000 mg | EXTENDED_RELEASE_TABLET | Freq: Two times a day (BID) | ORAL | 0 refills | Status: DC

## 2017-01-19 MED ORDER — METRONIDAZOLE 500 MG PO TABS
500.0000 mg | ORAL_TABLET | Freq: Three times a day (TID) | ORAL | Status: DC
  Filled 2017-01-19 (×2): qty 1

## 2017-01-19 MED ORDER — PREGABALIN 75 MG PO CAPS
150.0000 mg | ORAL_CAPSULE | Freq: Two times a day (BID) | ORAL | Status: DC
  Administered 2017-01-19: 150 mg via ORAL
  Filled 2017-01-19: qty 2

## 2017-01-19 MED ORDER — IOPAMIDOL (ISOVUE-300) INJECTION 61%
15.0000 mL | INTRAVENOUS | Status: AC
  Administered 2017-01-19 (×2): 15 mL via ORAL

## 2017-01-19 MED ORDER — IOPAMIDOL (ISOVUE-300) INJECTION 61%
100.0000 mL | Freq: Once | INTRAVENOUS | Status: AC | PRN
  Administered 2017-01-19: 100 mL via INTRAVENOUS

## 2017-01-19 MED ORDER — PREGABALIN 150 MG PO CAPS: 150.0000 mg | ORAL_CAPSULE | Freq: Two times a day (BID) | ORAL | Status: DC

## 2017-01-19 MED ORDER — METRONIDAZOLE 500 MG PO TABS: 500.0000 mg | ORAL_TABLET | Freq: Three times a day (TID) | ORAL | 0 refills | Status: DC

## 2017-01-19 MED ORDER — CIPROFLOXACIN HCL 500 MG PO TABS: 500.0000 mg | ORAL_TABLET | Freq: Two times a day (BID) | ORAL | 0 refills | Status: DC

## 2017-01-19 MED ORDER — CIPROFLOXACIN HCL 500 MG PO TABS
500.0000 mg | ORAL_TABLET | Freq: Two times a day (BID) | ORAL | Status: DC
  Filled 2017-01-19: qty 1

## 2017-01-19 MED ORDER — ALPRAZOLAM 0.25 MG PO TABS: 0.2500 mg | ORAL_TABLET | Freq: Two times a day (BID) | ORAL | 0 refills | Status: DC | PRN

## 2017-01-19 MED ORDER — MORPHINE SULFATE 15 MG PO TABS: 15.0000 mg | ORAL_TABLET | Freq: Four times a day (QID) | ORAL | 0 refills | Status: DC | PRN

## 2017-01-19 NOTE — Progress Notes (Signed)
[facility]. Report called to lisa, Charity fundraiserN. Pt will transport via ems.

## 2017-01-19 NOTE — Clinical Social Work Placement (Signed)
   CLINICAL SOCIAL WORK PLACEMENT  NOTE  Date:  01/19/2017  Patient Details  Name: Jessica Donovan MRN: [id number] Date of Birth: [birth year]  Clinical Social Work is seeking post-discharge placement for this patient at the Skilled  Nursing Facility level of care (*CSW will initial, date and re-position this form in  chart as items are completed):  Yes   Patient/family provided with [place] Clinical Social Work Department's list of facilities offering this level of care within the geographic area requested by the patient (or if unable, by the patient's family).  Yes   Patient/family informed of their freedom to choose among providers that offer the needed level of care, that participate in Medicare, Medicaid or managed care program needed by the patient, have an available bed and are willing to accept the patient.  Yes   Patient/family informed of [facility]'s ownership interest in [facility] and [facility], as well as of the fact that they are under no obligation to receive care at these facilities.  PASRR submitted to EDS on       PASRR number received on       Existing PASRR number confirmed on 01/17/17     FL2 transmitted to all facilities in geographic area requested by pt/family on 01/17/17     FL2 transmitted to all facilities within larger geographic area on       Patient informed that his/her managed care company has contracts with or will negotiate with certain facilities, including the following:        Yes   Patient/family informed of bed offers received.  Patient chooses bed at  [name] )     Physician recommends and patient chooses bed at      Patient to be transferred to  [name] ) on 01/19/17.  Patient to be transferred to facility by  [facility]     Patient family notified on 01/19/17 of transfer.  Name of family member notified:   (Patient's friend [name] is aware of D/C today. )     PHYSICIAN       Additional  Comment:    _______________________________________________ Json Koelzer, [name], LCSW 01/19/2017, 3:36 PM

## 2017-01-19 NOTE — Discharge Instructions (Signed)
Diabetic diet ° °Activity as tolerated with assistance °

## 2017-01-19 NOTE — Care Management Important Message (Signed)
Important Message  Patient Details  Name: Mellody DanceBarbara Filley MRN: [id number] Date of Birth: Nov 21, 1946   Medicare Important Message Given:  Yes    Marily MemosLisa M Orah Sonnen, RN 01/19/2017, 2:35 PM

## 2017-01-19 NOTE — NC FL2 (Signed)
[place] MEDICAID FL2 LEVEL OF CARE SCREENING TOOL     IDENTIFICATION  Patient Name: Jessica Donovan Birthdate: Jul 16, 1946 Sex: female Admission Date (Current Location): 01/17/2017  [place] Number:  Chiropodist and Address:  [facility], [address], [place], [place] [address]      Provider Number: [id number]  Attending Physician Name and Address:  Milagros Loll, MD  Relative Name and Phone Number:       Current Level of Care: Hospital Recommended Level of Care: Skilled Nursing Facility Prior Approval Number:    Date Approved/Denied:   PASRR Number: [id number]  Discharge Plan: SNF    Current Diagnoses: Patient Active Problem List   Diagnosis Date Noted  . Sepsis (HCC) 01/17/2017  . Cellulitis of fourth toe of right foot 04/10/2016  . Somnolence 04/09/2016  . Dementia 04/09/2016  . Cellulitis 04/03/2016  . Hematoma of right parietal scalp 02/23/2016  . Multiple falls 02/23/2016  . Physical debility 02/23/2016  . DM2 (diabetes mellitus, type 2) (HCC) 02/23/2016  . Syncope and collapse 02/23/2016  . Polypharmacy 02/23/2016  . Osteomyelitis of toe (HCC) 01/23/2016  . Rhabdomyolysis 12/07/2015  . ARF (acute renal failure) (HCC) 11/29/2015  . Hypotension 11/29/2015  . 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 Angina effort (HCC) 11/07/2015  . Osteomyelitis (HCC) 11/07/2015    Orientation RESPIRATION BLADDER Height & Weight     Self, Time, Situation, Place  Normal Continent Weight: 188 lb 11.2 oz (85.6 kg) Height:  5\' 4"  (162.6 cm)  BEHAVIORAL SYMPTOMS/MOOD NEUROLOGICAL BOWEL NUTRITION STATUS      Continent    AMBULATORY STATUS COMMUNICATION OF NEEDS Skin   Extensive Assist Verbally Bruising (Significant facial bruising from a fall)                       Personal Care Assistance Level of Assistance  Bathing, Dressing, Feeding Bathing Assistance: Limited assistance Feeding assistance:  Independent Dressing Assistance: Limited assistance     Functional Limitations Info             SPECIAL CARE FACTORS FREQUENCY  PT (By licensed PT), OT (By licensed OT)     PT Frequency: Up to 5X a day, 5 days a week OT Frequency: Up to 5X a day, 5 days a week            Contractures Contractures Info: Present    Additional Factors Info  Code Status, Allergies Code Status Info: DNR Allergies Info: Codeine, Influenza Vaccines, Methadone, Percocet Oxycodone-acetaminophen, Tetanus Toxoids, Tetracyclines & Related           Current Medications (01/19/2017):  This is the current hospital active medication list Current Facility-Administered Medications  Medication Dose Route Frequency Provider Last Rate Last Dose  . acetaminophen (TYLENOL) tablet 650 mg  650 mg Oral Q6H PRN Arnaldo Natal, MD       Or  . acetaminophen (TYLENOL) suppository 650 mg  650 mg Rectal Q6H PRN Arnaldo Natal, MD      . ALPRAZolam Prudy Feeler) tablet 0.25 mg  0.25 mg Oral TID PRN Milagros Loll, MD   0.25 mg at 01/18/17 1811  . ALPRAZolam Prudy Feeler) tablet 1 mg  1 mg Oral QHS PRN Arnaldo Natal, MD   1 mg at 01/18/17 2209  . aspirin EC tablet 81 mg  81 mg Oral Daily Arnaldo Natal, MD   81 mg at 01/19/17 0955  . atorvastatin (LIPITOR) tablet 20 mg  20 mg  Oral Daily Arnaldo Natal, MD   20 mg at 01/19/17 1610  . cefTRIAXone (ROCEPHIN) IVPB 1 g  1 g Intravenous Q24H Arnaldo Natal, MD   1 g at 01/19/17 0957  . Chlorhexidine Gluconate Cloth 2 % PADS 6 each  6 each Topical Q0600 Srikar Sudini, MD      . cyclobenzaprine (FLEXERIL) tablet 10 mg  10 mg Oral TID PRN Arnaldo Natal, MD   10 mg at 01/19/17 0523  . enoxaparin (LOVENOX) injection 40 mg  40 mg Subcutaneous Q24H Arnaldo Natal, MD   40 mg at 01/18/17 2149  . ferrous sulfate tablet 325 mg  325 mg Oral Q breakfast Arnaldo Natal, MD   325 mg at 01/19/17 0955  . fesoterodine (TOVIAZ) tablet 4 mg  4 mg Oral Daily Arnaldo Natal, MD    4 mg at 01/19/17 9604  . furosemide (LASIX) tablet 20 mg  20 mg Oral Daily Arnaldo Natal, MD   20 mg at 01/19/17 0955  . insulin aspart (novoLOG) injection 0-15 Units  0-15 Units Subcutaneous TID WC Adrian Saran, MD   2 Units at 01/19/17 1253  . insulin aspart (novoLOG) injection 0-5 Units  0-5 Units Subcutaneous QHS Sital Mody, MD      . insulin glargine (LANTUS) injection 36 Units  36 Units Subcutaneous QHS Arnaldo Natal, MD   36 Units at 01/18/17 2206  . isosorbide mononitrate (IMDUR) 24 hr tablet 30 mg  30 mg Oral Daily Arnaldo Natal, MD   30 mg at 01/19/17 0956  . lisinopril (PRINIVIL,ZESTRIL) tablet 20 mg  20 mg Oral Daily Arnaldo Natal, MD   20 mg at 01/19/17 0955  . metoprolol tartrate (LOPRESSOR) tablet 25 mg  25 mg Oral BID Arnaldo Natal, MD   25 mg at 01/19/17 0955  . mirtazapine (REMERON) tablet 15 mg  15 mg Oral QHS Arnaldo Natal, MD   15 mg at 01/18/17 2209  . morphine (MS CONTIN) 12 hr tablet 30 mg  30 mg Oral BID Arnaldo Natal, MD   30 mg at 01/19/17 0955  . morphine (MSIR) tablet 15 mg  15 mg Oral Q6H PRN Arnaldo Natal, MD   15 mg at 01/19/17 1253  . multivitamin with minerals tablet 1 tablet  1 tablet Oral Daily Arnaldo Natal, MD   1 tablet at 01/19/17 [phone]  . mupirocin ointment (BACTROBAN) 2 % 1 application  1 application Nasal BID Milagros Loll, MD   1 application at 01/19/17 [phone]  . nitroGLYCERIN (NITROSTAT) SL tablet 0.4 mg  0.4 mg Sublingual Q5 min PRN Arnaldo Natal, MD      . ondansetron [facility]) tablet 4 mg  4 mg Oral Q6H PRN Arnaldo Natal, MD   4 mg at 01/18/17 1478   Or  . ondansetron [facility]) injection 4 mg  4 mg Intravenous Q6H PRN Arnaldo Natal, MD      . pantoprazole (PROTONIX) EC tablet 40 mg  40 mg Oral Daily Arnaldo Natal, MD   40 mg at 01/19/17 0955  . polyvinyl alcohol (LIQUIFILM TEARS) 1.4 % ophthalmic solution 1 drop  1 drop Both Eyes PRN Milagros Loll, MD   1 drop at 01/19/17 0018  . pregabalin (LYRICA)  capsule 150 mg  150 mg Oral BID Milagros Loll, MD   150 mg at 01/19/17 1253  . sodium chloride flush (NS) 0.9 % injection 3 mL  3 mL Intravenous Q12H  Arnaldo NatalMichael S Diamond, MD   3 mL at 01/19/17 [phone]  . ticagrelor (BRILINTA) tablet 90 mg  90 mg Oral BID Arnaldo NatalMichael S Diamond, MD   90 mg at 01/19/17 1253  . venlafaxine XR (EFFEXOR-XR) 24 hr capsule 150 mg  150 mg Oral Q breakfast Arnaldo NatalMichael S Diamond, MD   150 mg at 01/19/17 0955  . vitamin B-12 (CYANOCOBALAMIN) tablet 1,000 mcg  1,000 mcg Oral Daily Arnaldo NatalMichael S Diamond, MD   1,000 mcg at 01/19/17 0955  . Vitamin D (Ergocalciferol) (DRISDOL) capsule 50,000 Units  50,000 Units Oral Q7 days Arnaldo NatalMichael S Diamond, MD   50,000 Units at 01/17/17 1220     Discharge Medications: Please see discharge summary for a list of discharge medications.  Relevant Imaging Results:  Relevant Lab Results:   Additional Information SS# [id number]  Ein Rijo, [name], [place]

## 2017-01-19 NOTE — Progress Notes (Signed)
Physical Therapy Treatment Patient Details Name: Jessica Donovan MRN: [id number] DOB: 12/19/1946 Today's Date: 01/19/2017    History of Present Illness Pt is a 71 y.o. female presenting to hospital after multiple falls at home.  Pt admitted with sepsis, UTI, and somnolence.  PMH includes L 3rd toe amp 11/10/15, L 2nd toe amp 01/24/16, h/o R great toe amp, h/o osteomyeltis, MI, stroke, DM, CAD.    PT Comments    Pt on commode for about 1 hour before session.  Stood and was able to tend to self care tasks and grooming at sink with supervision.  Ambulated 40' in room with walker and min guard.  Pt limited by general fatigue.  No lob's but somewhat unsteady on her feet with decreased step height and length bilaterally.    Pt admits that she has been having increasing difficulty at home prior to admission and has "conceeded" that she needs more help.  She became teary over leaving her small dog and stated she has been putting it off as she does not want to give him up.  She stated she is hoping to find an ALF once she is done with rehab if it is available to her.   Follow Up Recommendations  SNF     Equipment Recommendations  Rolling walker with 5" wheels    Recommendations for Other Services       Precautions / Restrictions Precautions Precautions: Fall Restrictions Weight Bearing Restrictions: No    Mobility  Bed Mobility Overal bed mobility: Modified Independent Bed Mobility: Sit to Supine     Supine to sit: Supervision        Transfers     Transfers: Sit to/from Stand Sit to Stand: Min guard            Ambulation/Gait Ambulation/Gait assistance: Min guard Ambulation Distance (Feet): 40 Feet       Gait velocity interpretation: Below normal speed for age/gender     Stairs            Wheelchair Mobility    Modified Rankin (Stroke Patients Only)       Balance Overall balance assessment: Needs assistance Sitting-balance support: Feet  supported Sitting balance-Leahy Scale: Good     Standing balance support: Bilateral upper extremity supported Standing balance-Leahy Scale: Fair                      Cognition Arousal/Alertness: Awake/alert Behavior During Therapy: WFL for tasks assessed/performed Overall Cognitive Status: Within Functional Limits for tasks assessed                      Exercises Other Exercises Other Exercises: self care on commode and standing at sink for tasks x 4 minutes with supervision    General Comments        Pertinent Vitals/Pain Pain Assessment: No/denies pain    Home Living                      Prior Function            PT Goals (current goals can now be found in the care plan section) Progress towards PT goals: Progressing toward goals    Frequency    Min 2X/week      PT Plan      Co-evaluation             End of Session Equipment Utilized During Treatment: Gait belt;Oxygen Activity Tolerance: Patient limited by fatigue  Patient left: in bed;with call bell/phone within reach;with bed alarm set     Time: 1100-1120 PT Time Calculation (min) (ACUTE ONLY): 20 min  Charges:  $Gait Training: 8-22 mins                    G Codes:      Danielle DessSarah Ginny Loomer 01/19/2017, 11:24 AM

## 2017-01-19 NOTE — Discharge Summary (Addendum)
[facility] [place] [facility]   PATIENT NAME: Jessica Donovan    MR#:  [id number]  DATE OF BIRTH:  May 26, 1946  DATE OF ADMISSION:  01/17/2017 ADMITTING PHYSICIAN: Arnaldo Natal, MD  DATE OF DISCHARGE: 01/19/2017  PRIMARY CARE PHYSICIAN: Lauro Regulus., MD   ADMISSION DIAGNOSIS:  Dehydration [E86.0] Lower urinary tract infectious disease [N39.0] Closed head injury [S09.90XA] Contusion of face, initial encounter [S00.83XA] Fall, initial encounter [W19.XXXA]  DISCHARGE DIAGNOSIS:  Active Problems:   Sepsis (HCC)   SECONDARY DIAGNOSIS:   Past Medical History:  Diagnosis Date  . Chronic back pain   . Coronary artery disease   . Diabetes mellitus without complication (HCC)   . Hypertension   . Low back pain   . Myocardial infarction   . Osteomyelitis of toe (HCC) 01/23/2016  . Stroke [facility])      ADMITTING HISTORY  Chief Complaint: Falls HPI: The patient with past medical history of CAD, diabetes mellitus type 2, and chronic pain presents tot he emergency department following a fall.  She states that it took her a long time to crawl on the floor to the phone to call for help.  She fell at least 4 times yesterday.  She cannot recall if she lost consciousness.  The patient is very somnolent which makes it difficult for her to contribute to her history. The patient has suffered multiple contusions to her head. CT of her head showed no acute abnormalities. However, the patient was tachycardic, tachypneic and leukocytosis was present on lab work which prompted the emergency department staff to initiate sepsis protocol. Urinalysis showed infection as likely source. The patient received ceftriaxone in the emergency department prior to the hospitalist service being called for admission.  HOSPITAL COURSE:   This is a 71 year old female admitted for sepsis.  * Diarrhea with acute ascending colitis No further diarrhea at this time. Started on Flagyl and  ciprofloxacin. Continue for 1 more week after discharge. Mild abdominal pain.  * sepsis present on admission likely due to UTI.  Urine cultures negative. IV antibiotics finished 3 days. Sepsis resolved.  Blood cultures 1 out of 2 bottles have quite is negative staph. Likely contaminant.  * acute encephalopathy on admission due to narcotic medications and acute illness. This is resolved.  * CAD: Stable; continue aspirin,Brillintaand Imdur  *. Hypertension: Controlled; continue lisinopril and metoprolol  * Diabetes mellitus type 2 Continue basal insulin.  *  Mild Dementia: Continue mirtazapine and Effexor  * Urinary incontinence: Continue tolterodine  Patient stable to discharge to skilled nursing facility for further physical therapy.  CONSULTS OBTAINED:    DRUG ALLERGIES:   Allergies  Allergen Reactions  . Codeine Nausea And Vomiting  . Influenza Vaccines Other (See Comments)    Reaction:  Caused pt to pass out   . Methadone Hives and Itching  . Percocet [Oxycodone-Acetaminophen] Nausea And Vomiting  . Tetanus Toxoids Swelling and Other (See Comments)    Reaction:  Swelling at injection site  . Tetracyclines & Related Rash    DISCHARGE MEDICATIONS:   Current Discharge Medication List    START taking these medications   Details  pregabalin (LYRICA) 150 MG capsule Take 1 capsule (150 mg total) by mouth 2 (two) times daily.      CONTINUE these medications which have CHANGED   Details  ALPRAZolam (XANAX) 0.25 MG tablet Take 1 tablet (0.25 mg total) by mouth 2 (two) times daily as needed for anxiety or sleep. Qty: 10 tablet, Refills:  0    morphine (MSIR) 15 MG tablet Take 1 tablet (15 mg total) by mouth every 6 (six) hours as needed for severe pain. Qty: 15 tablet, Refills: 0    Morphine Sulfate ER 30 MG TBEA Take 30 mg by mouth 2 (two) times daily. Qty: 10 tablet, Refills: 0      CONTINUE these medications which have NOT CHANGED   Details   acetaminophen (TYLENOL) 500 MG tablet Take 1,000 mg by mouth every 4 (four) hours as needed for mild pain or headache.     aspirin EC 81 MG tablet Take 81 mg by mouth daily.    atorvastatin (LIPITOR) 20 MG tablet Take 20 mg by mouth daily.    Biotin 5 MG CAPS Take 5 mg by mouth daily at 12 noon.     cyclobenzaprine (FLEXERIL) 10 MG tablet Take 1 tablet (10 mg total) by mouth 3 (three) times daily as needed for muscle spasms. Qty: 20 tablet, Refills: 0    ferrous sulfate 325 (65 FE) MG tablet Take 325 mg by mouth daily with breakfast.    furosemide (LASIX) 20 MG tablet Take 20 mg by mouth daily.    insulin aspart (NOVOLOG) 100 UNIT/ML injection Inject 3-15 Units into the skin 3 (three) times daily with meals as needed for high blood sugar. Pt uses as needed per sliding scale:    Less than 140:  0 units  140-180:  3 units 181-220:  4 units 221- 260:  6 units 261- 320:  8 units 321-360:  10 units 361-400:  12 units Greater than 400:  15 units    Insulin Glargine (TOUJEO SOLOSTAR) 300 UNIT/ML SOPN Inject 60 Units into the skin daily.     isosorbide mononitrate (IMDUR) 30 MG 24 hr tablet Take 30 mg by mouth daily.     lisinopril (PRINIVIL,ZESTRIL) 20 MG tablet Take 20 mg by mouth daily.    metoprolol (LOPRESSOR) 50 MG tablet Take 25 mg by mouth 2 (two) times daily.     mirtazapine (REMERON) 15 MG tablet Take 15 mg by mouth at bedtime.    Multiple Vitamin (MULTIVITAMIN WITH MINERALS) TABS tablet Take 1 tablet by mouth daily.    mupirocin cream (BACTROBAN) 2 % Apply topically 2 (two) times daily. Qty: 15 g, Refills: 0    nitrofurantoin, macrocrystal-monohydrate, (MACROBID) 100 MG capsule Take 100 mg by mouth daily.    nitroGLYCERIN (NITROSTAT) 0.4 MG SL tablet Place 0.4 mg under the tongue every 5 (five) minutes as needed for chest pain. Reported on 04/16/2016    omeprazole (PRILOSEC) 20 MG capsule Take 20 mg by mouth daily.    ticagrelor (BRILINTA) 90 MG TABS tablet Take 90  mg by mouth 2 (two) times daily.    tolterodine (DETROL LA) 4 MG 24 hr capsule Take 4 mg by mouth daily.    venlafaxine XR (EFFEXOR-XR) 150 MG 24 hr capsule Take 150 mg by mouth daily with breakfast.    vitamin B-12 (CYANOCOBALAMIN) 1000 MCG tablet Take 1,000 mcg by mouth daily.    Vitamin D, Ergocalciferol, (DRISDOL) 50000 units CAPS capsule Take 50,000 Units by mouth every 7 (seven) days. Pt takes on Sunday.      STOP taking these medications     traZODone (DESYREL) 50 MG tablet         Today   VITAL SIGNS:  Blood pressure 132/61, pulse 84, temperature 98.3 F (36.8 C), temperature source Oral, resp. rate 19, height 5\' 4"  (1.626 m), weight 85.6 kg (  188 lb 11.2 oz), SpO2 94 %.  I/O:   Intake/Output Summary (Last 24 hours) at 01/19/17 1326 Last data filed at 01/19/17 0800  Gross per 24 hour  Intake              240 ml  Output              200 ml  Net               40 ml    PHYSICAL EXAMINATION:  Physical Exam  GENERAL:  71 y.o.-year-old patient lying in the bed with no acute distress.  LUNGS: Normal breath sounds bilaterally, no wheezing, rales,rhonchi or crepitation. No use of accessory muscles of respiration.  CARDIOVASCULAR: S1, S2 normal. No murmurs, rubs, or gallops.  ABDOMEN: Soft, non-tender, non-distended. Bowel sounds present. No organomegaly or mass.  NEUROLOGIC: Moves all 4 extremities. PSYCHIATRIC: The patient is alert and oriented x 3.  SKIN: bruising on face  DATA REVIEW:   CBC  Recent Labs Lab 01/17/17 0718  WBC 12.4*  HGB 10.8*  HCT 31.8*  PLT 217    Chemistries   Recent Labs Lab 01/17/17 0718  NA 132*  K 4.0  CL 102  CO2 23  GLUCOSE 204*  BUN 21*  CREATININE 0.79  CALCIUM 8.2*    Cardiac Enzymes  Recent Labs Lab 01/17/17 0027  TROPONINI <0.03    Microbiology Results  Results for orders placed or performed during the hospital encounter of 01/17/17  Urine culture     Status: None   Collection Time: 01/17/17 12:27 AM   Result Value Ref Range Status   Specimen Description URINE, RANDOM  Final   Special Requests NONE  Final   Culture   Final    NO GROWTH Performed at [facility], [address] [place], [place] [address]    Report Status 01/18/2017 FINAL  Final  Culture, blood (routine x 2)     Status: Abnormal (Preliminary result)   Collection Time: 01/17/17  2:24 AM  Result Value Ref Range Status   Specimen Description BLOOD LEFT ARM  Final   Special Requests   Final    BOTTLES DRAWN AEROBIC AND ANAEROBIC 10CCAERO,11CCANA   Culture  Setup Time   Final    GRAM POSITIVE COCCI AEROBIC BOTTLE ONLY CRITICAL RESULT CALLED TO, READ BACK BY AND VERIFIED WITH: MATT MCBANE AT 0106 ON 01/18/17 RWW CONFIRMED BY [facility]    Culture (A)  Final    STAPHYLOCOCCUS SPECIES (COAGULASE NEGATIVE) CULTURE REINCUBATED FOR BETTER GROWTH Performed at [facility], [address], [place], [place] [address]    Report Status PENDING  Incomplete  Blood Culture ID Panel (Reflexed)     Status: Abnormal   Collection Time: 01/17/17  2:24 AM  Result Value Ref Range Status   Enterococcus species NOT DETECTED NOT DETECTED Final   Listeria monocytogenes NOT DETECTED NOT DETECTED Final   Staphylococcus species DETECTED (A) NOT DETECTED Final    Comment: CRITICAL RESULT CALLED TO, READ BACK BY AND VERIFIED WITH: MATT MCBANE AT 0106 ON 01/18/17 RWW    Staphylococcus aureus NOT DETECTED NOT DETECTED Final   Methicillin resistance DETECTED (A) NOT DETECTED Final    Comment: CRITICAL RESULT CALLED TO, READ BACK BY AND VERIFIED WITH: MATT MCBANE AT 0106 ON 01/18/17 RWW    Streptococcus species NOT DETECTED NOT DETECTED Final   Streptococcus agalactiae NOT DETECTED NOT DETECTED Final   Streptococcus pneumoniae NOT DETECTED NOT DETECTED Final  Streptococcus pyogenes NOT DETECTED NOT DETECTED Final   Acinetobacter baumannii NOT DETECTED NOT DETECTED Final   Enterobacteriaceae species NOT DETECTED NOT DETECTED Final    Enterobacter cloacae complex NOT DETECTED NOT DETECTED Final   Escherichia coli NOT DETECTED NOT DETECTED Final   Klebsiella oxytoca NOT DETECTED NOT DETECTED Final   Klebsiella pneumoniae NOT DETECTED NOT DETECTED Final   Proteus species NOT DETECTED NOT DETECTED Final   Serratia marcescens NOT DETECTED NOT DETECTED Final   Haemophilus influenzae NOT DETECTED NOT DETECTED Final   Neisseria meningitidis NOT DETECTED NOT DETECTED Final   Pseudomonas aeruginosa NOT DETECTED NOT DETECTED Final   Candida albicans NOT DETECTED NOT DETECTED Final   Candida glabrata NOT DETECTED NOT DETECTED Final   Candida krusei NOT DETECTED NOT DETECTED Final   Candida parapsilosis NOT DETECTED NOT DETECTED Final   Candida tropicalis NOT DETECTED NOT DETECTED Final  Culture, blood (routine x 2)     Status: None (Preliminary result)   Collection Time: 01/17/17  2:29 AM  Result Value Ref Range Status   Specimen Description BLOOD RIGHT ARM  Final   Special Requests   Final    BOTTLES DRAWN AEROBIC AND ANAEROBIC 10CCAERO,11CCANA   Culture NO GROWTH 2 DAYS  Final   Report Status PENDING  Incomplete  MRSA PCR Screening     Status: Abnormal   Collection Time: 01/18/17 11:20 AM  Result Value Ref Range Status   MRSA by PCR POSITIVE (A) NEGATIVE Final    Comment:        The GeneXpert MRSA Assay (FDA approved for NASAL specimens only), is one component of a comprehensive MRSA colonization surveillance program. It is not intended to diagnose MRSA infection nor to guide or monitor treatment for MRSA infections. RESULT CALLED TO, READ BACK BY AND VERIFIED WITH: Amalia GreenhouseARON CARTER 01/18/17 1250 SGD     RADIOLOGY:  No results found.  Follow up with PCP in 1 week.  Management plans discussed with the patient, family and they are in agreement.  CODE STATUS:     Code Status Orders        Start     Ordered   01/17/17 0407  Do not attempt resuscitation (DNR)  Continuous    Question Answer Comment  In the  event of cardiac or respiratory ARREST Do not call a "code blue"   In the event of cardiac or respiratory ARREST Do not perform Intubation, CPR, defibrillation or ACLS   In the event of cardiac or respiratory ARREST Use medication by any route, position, wound care, and other measures to relive pain and suffering. May use oxygen, suction and manual treatment of airway obstruction as needed for comfort.      01/17/17 0407    Code Status History    Date Active Date Inactive Code Status Order ID Comments User Context   04/09/2016  3:11 AM 04/10/2016  8:44 PM DNR [id number]  Arnaldo NatalMichael S Diamond, MD Inpatient   04/09/2016  2:05 AM 04/09/2016  3:11 AM Full Code [id number]  Oralia Manisavid Willis, MD Inpatient   04/03/2016  6:53 PM 04/04/2016  6:17 PM Full Code [id number]  Houston SirenVivek J Sainani, MD Inpatient   02/23/2016 10:52 PM 02/25/2016  4:37 PM DNR [id number]  Robley FriesAbdullahi Oseni, MD Inpatient   01/24/2016  3:54 PM 01/27/2016  7:06 PM DNR [id number]  Milagros LollSrikar Jamesa Tedrick, MD Inpatient   01/23/2016  4:07 PM 01/23/2016  4:22 PM Full Code [id number]  Shaune PollackQing Chen, MD Inpatient   12/07/2015 10:23 PM 12/09/2015  6:58 PM Full Code [id number]  Auburn Bilberry, MD Inpatient   11/29/2015  5:41 PM 12/01/2015  6:33 PM DNR [id number]  Shaune Pollack, MD Inpatient   11/10/2015  9:54 AM 11/12/2015  9:39 PM DNR [id number]  Linus Galas, MD Inpatient   11/07/2015  5:32 PM 11/10/2015  9:54 AM DNR [id number]  Altamese Dilling, MD Inpatient   11/07/2015  3:49 PM 11/07/2015  5:32 PM Full Code [id number]  Altamese Dilling, MD Inpatient    Advance Directive Documentation   Flowsheet Row Most Recent Value  Type of Advance Directive  Out of facility DNR (pink MOST or yellow form)  Pre-existing out of facility DNR order (yellow form or pink MOST form)  No data  "MOST" Form in Place?  No data     TOTAL TIME TAKING CARE OF THIS PATIENT ON DAY OF DISCHARGE: more than 30 minutes.   Milagros Loll R M.D on 01/19/2017 at 1:26 PM  Between 7am to 6pm - Pager -  [phone]  After 6pm go to [email or web address] - password [email or web address] [facility]  [facility]  Office  [phone]  CC: Primary care physician; Lauro Regulus., MD  Note: This dictation was prepared with Dragon dictation along with smaller phrase technology. Any transcriptional errors that result from this process are unintentional.

## 2017-01-19 NOTE — Progress Notes (Addendum)
Patient is medically stable for discharge today to [facility]. Per [facility] admissions coordinator at [facility], patient can come to room 407. Clinical Education officer, museum (CSW) sent discharge orders in [place]. Social work Theatre manager met with patient at bedside and explained that patient will discharge today to [facility]. Patient verbally agreed she understood. Social work Theatre manager spoke with patient's friend, Deneise Lever, making her aware of patient's discharge today to [facility]. RN will call report and arrange EMS for transport. Health Team authorization has been received, Auth # [id number]. Please re-consult if future social work needs arise. Social work Architectural technologist off.   Danie Chandler, Social Work Intern  [phone]

## 2017-01-20 DIAGNOSIS — G9340 Encephalopathy, unspecified: Secondary | ICD-10-CM | POA: Diagnosis not present

## 2017-01-20 DIAGNOSIS — N390 Urinary tract infection, site not specified: Secondary | ICD-10-CM | POA: Diagnosis not present

## 2017-01-20 DIAGNOSIS — G894 Chronic pain syndrome: Secondary | ICD-10-CM | POA: Diagnosis not present

## 2017-01-20 DIAGNOSIS — F329 Major depressive disorder, single episode, unspecified: Secondary | ICD-10-CM | POA: Diagnosis not present

## 2017-01-20 DIAGNOSIS — E119 Type 2 diabetes mellitus without complications: Secondary | ICD-10-CM | POA: Diagnosis not present

## 2017-01-20 LAB — CULTURE, BLOOD (ROUTINE X 2)

## 2017-01-21 ENCOUNTER — Emergency Department
Admission: EM | Admit: 2017-01-21 | Discharge: 2017-01-21 | Disposition: A | Discharge status: Home / Self Care | Payer: PPO | Attending: Emergency Medicine | Admitting: Emergency Medicine

## 2017-01-21 ENCOUNTER — Emergency Department: Payer: PPO

## 2017-01-21 ENCOUNTER — Encounter: Payer: Self-pay | Admitting: Emergency Medicine

## 2017-01-21 DIAGNOSIS — I10 Essential (primary) hypertension: Secondary | ICD-10-CM | POA: Diagnosis not present

## 2017-01-21 DIAGNOSIS — Z79899 Other long term (current) drug therapy: Secondary | ICD-10-CM | POA: Insufficient documentation

## 2017-01-21 DIAGNOSIS — Y92002 Bathroom of unspecified non-institutional (private) residence single-family (private) house as the place of occurrence of the external cause: Secondary | ICD-10-CM | POA: Insufficient documentation

## 2017-01-21 DIAGNOSIS — Z7982 Long term (current) use of aspirin: Secondary | ICD-10-CM | POA: Insufficient documentation

## 2017-01-21 DIAGNOSIS — S0990XA Unspecified injury of head, initial encounter: Secondary | ICD-10-CM | POA: Diagnosis not present

## 2017-01-21 DIAGNOSIS — S0101XA Laceration without foreign body of scalp, initial encounter: Secondary | ICD-10-CM | POA: Diagnosis not present

## 2017-01-21 DIAGNOSIS — W010XXA Fall on same level from slipping, tripping and stumbling without subsequent striking against object, initial encounter: Secondary | ICD-10-CM | POA: Insufficient documentation

## 2017-01-21 DIAGNOSIS — S199XXA Unspecified injury of neck, initial encounter: Secondary | ICD-10-CM | POA: Diagnosis not present

## 2017-01-21 DIAGNOSIS — I2510 Atherosclerotic heart disease of native coronary artery without angina pectoris: Secondary | ICD-10-CM | POA: Insufficient documentation

## 2017-01-21 DIAGNOSIS — Z794 Long term (current) use of insulin: Secondary | ICD-10-CM | POA: Insufficient documentation

## 2017-01-21 DIAGNOSIS — Y939 Activity, unspecified: Secondary | ICD-10-CM | POA: Insufficient documentation

## 2017-01-21 DIAGNOSIS — Y999 Unspecified external cause status: Secondary | ICD-10-CM | POA: Diagnosis not present

## 2017-01-21 DIAGNOSIS — E119 Type 2 diabetes mellitus without complications: Secondary | ICD-10-CM | POA: Diagnosis not present

## 2017-01-21 LAB — PROTIME-INR
INR: 0.95
Prothrombin Time: 12.7 seconds (ref 11.4–15.2)

## 2017-01-21 MED ORDER — LIDOCAINE HCL (PF) 1 % IJ SOLN
INTRAMUSCULAR | Status: AC
  Administered 2017-01-21: 30 mL via INTRADERMAL
  Filled 2017-01-21: qty 5

## 2017-01-21 MED ORDER — LIDOCAINE HCL (PF) 1 % IJ SOLN
30.0000 mL | Freq: Once | INTRAMUSCULAR | Status: AC
  Administered 2017-01-21: 30 mL via INTRADERMAL

## 2017-01-21 NOTE — ED Triage Notes (Signed)
[facility] post unwitnessed fall in pts bathroom. Upon arrival pt has bruising to right face (under right eye, around mouth,) 1 1/2 laceration to right side of head, bleeding controled at this time. Pt alert and talking. HX of dementia. MD at bedside.

## 2017-01-21 NOTE — ED Provider Notes (Signed)
[facility] Emergency Department Provider Note   First MD Initiated Contact with Patient 01/21/17 0143     (approximate)  I have reviewed the triage vital signs and the nursing notes.   HISTORY  Chief Complaint Fall    HPI Jessica DanceBarbara Donovan is a 71 y.o. female presents via EMS from [facility] status post slip and fall. Patient states that she was in route to the bathroom when she accidentally slipped on urine with resultant head injury   Past Medical History:  Diagnosis Date  . Chronic back pain   . Collagen vascular disease (HCC)   . Coronary artery disease   . Diabetes mellitus without complication (HCC)   . Hypertension   . Low back pain   . Myocardial infarction   . Osteomyelitis of toe (HCC) 01/23/2016  . Stroke [facility](HCC)     Patient Active Problem List   Diagnosis Date Noted  . Acute colitis 01/19/2017  . Sepsis (HCC) 01/17/2017  . Cellulitis of fourth toe of right foot 04/10/2016  . Somnolence 04/09/2016  . Dementia 04/09/2016  . Cellulitis 04/03/2016  . Hematoma of right parietal scalp 02/23/2016  . Multiple falls 02/23/2016  . Physical debility 02/23/2016  . DM2 (diabetes mellitus, type 2) (HCC) 02/23/2016  . Syncope and collapse 02/23/2016  . Polypharmacy 02/23/2016  . Osteomyelitis of toe (HCC) 01/23/2016  . Rhabdomyolysis 12/07/2015  . ARF (acute renal failure) (HCC) 11/29/2015  . Hypotension 11/29/2015  . 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 Angina effort (HCC) 11/07/2015  . Osteomyelitis (HCC) 11/07/2015    Past Surgical History:  Procedure Laterality Date  . AMPUTATION TOE Left 11/10/2015   Procedure: AMPUTATION TOE;  Surgeon: Linus Galasodd Cline, MD;  Location: [facility] ORS;  Service: Podiatry;  Laterality: Left;  . AMPUTATION TOE Left 01/24/2016   Procedure: AMPUTATION TOE (2nd mpj);  Surgeon: Linus Galasodd Cline, DPM;  Location: [facility] ORS;  Service: Podiatry;  Laterality: Left;  . CARDIAC CATHETERIZATION N/A 11/12/2015   Procedure:  Left Heart Cath and Coronary Angiography;  Surgeon: Marcina MillardAlexander Paraschos, MD;  Location: [facility] INVASIVE CV LAB;  Service: Cardiovascular;  Laterality: N/A;  . TOE AMPUTATION      Prior to Admission medications   Medication Sig Start Date End Date Taking? Authorizing Provider  acetaminophen (TYLENOL) 500 MG tablet Take 1,000 mg by mouth every 4 (four) hours as needed for mild pain or headache.    Yes Historical Provider, MD  ALPRAZolam (XANAX) 0.25 MG tablet Take 1 tablet (0.25 mg total) by mouth 2 (two) times daily as needed for anxiety or sleep. 01/19/17  Yes Srikar Sudini, MD  aspirin EC 81 MG tablet Take 81 mg by mouth daily.   Yes Historical Provider, MD  atorvastatin (LIPITOR) 20 MG tablet Take 20 mg by mouth daily.   Yes Historical Provider, MD  Biotin 5 MG CAPS Take 5 mg by mouth daily at 12 noon.    Yes Historical Provider, MD  ciprofloxacin (CIPRO) 500 MG tablet Take 1 tablet (500 mg total) by mouth 2 (two) times daily. 01/19/17  Yes Srikar Sudini, MD  cyclobenzaprine (FLEXERIL) 10 MG tablet Take 1 tablet (10 mg total) by mouth 3 (three) times daily as needed for muscle spasms. 01/27/16  Yes Enid Baasadhika Kalisetti, MD  ferrous sulfate 325 (65 FE) MG tablet Take 325 mg by mouth daily with breakfast.   Yes Historical Provider, MD  furosemide (LASIX) 20 MG tablet Take 20 mg by mouth daily.   Yes Historical Provider, MD  insulin  aspart (NOVOLOG) 100 UNIT/ML injection Inject 3-15 Units into the skin 3 (three) times daily with meals as needed for high blood sugar. Pt uses as needed per sliding scale:    Less than 140:  0 units  140-180:  3 units 181-220:  4 units 221- 260:  6 units 261- 320:  8 units 321-360:  10 units 361-400:  12 units Greater than 400:  15 units   Yes Historical Provider, MD  Insulin Glargine (TOUJEO SOLOSTAR) 300 UNIT/ML SOPN Inject 60 Units into the skin daily.    Yes Historical Provider, MD  isosorbide mononitrate (IMDUR) 30 MG 24 hr tablet Take 30 mg by mouth daily.    Yes  Historical Provider, MD  lisinopril (PRINIVIL,ZESTRIL) 20 MG tablet Take 20 mg by mouth daily.   Yes Historical Provider, MD  metoprolol (LOPRESSOR) 50 MG tablet Take 25 mg by mouth 2 (two) times daily.    Yes Historical Provider, MD  metroNIDAZOLE (FLAGYL) 500 MG tablet Take 1 tablet (500 mg total) by mouth every 8 (eight) hours. 01/19/17  Yes Srikar Sudini, MD  mirtazapine (REMERON) 15 MG tablet Take 15 mg by mouth at bedtime.   Yes Historical Provider, MD  morphine (MSIR) 15 MG tablet Take 1 tablet (15 mg total) by mouth every 6 (six) hours as needed for severe pain. 01/19/17  Yes Srikar Sudini, MD  Morphine Sulfate ER 30 MG TBEA Take 30 mg by mouth 2 (two) times daily. 01/19/17  Yes Srikar Sudini, MD  Multiple Vitamin (MULTIVITAMIN WITH MINERALS) TABS tablet Take 1 tablet by mouth daily.   Yes Historical Provider, MD  nitrofurantoin, macrocrystal-monohydrate, (MACROBID) 100 MG capsule Take 100 mg by mouth daily.   Yes Historical Provider, MD  omeprazole (PRILOSEC) 20 MG capsule Take 20 mg by mouth daily.   Yes Historical Provider, MD  pregabalin (LYRICA) 150 MG capsule Take 1 capsule (150 mg total) by mouth 2 (two) times daily. 01/19/17  Yes Srikar Sudini, MD  ticagrelor (BRILINTA) 90 MG TABS tablet Take 90 mg by mouth 2 (two) times daily.   Yes Historical Provider, MD  tolterodine (DETROL LA) 4 MG 24 hr capsule Take 4 mg by mouth daily.   Yes Historical Provider, MD  venlafaxine XR (EFFEXOR-XR) 150 MG 24 hr capsule Take 150 mg by mouth daily with breakfast.   Yes Historical Provider, MD  vitamin B-12 (CYANOCOBALAMIN) 1000 MCG tablet Take 1,000 mcg by mouth daily.   Yes Historical Provider, MD  Vitamin D, Ergocalciferol, (DRISDOL) 50000 units CAPS capsule Take 50,000 Units by mouth every 7 (seven) days. Pt takes on Sunday.   Yes Historical Provider, MD  mupirocin cream (BACTROBAN) 2 % Apply topically 2 (two) times daily. 04/10/16   Katharina Caper, MD  nitroGLYCERIN (NITROSTAT) 0.4 MG SL tablet Place  0.4 mg under the tongue every 5 (five) minutes as needed for chest pain. Reported on 04/16/2016    Historical Provider, MD    Allergies Codeine; Influenza vaccines; Methadone; Percocet [oxycodone-acetaminophen]; Tetanus toxoids; and Tetracyclines & related  Family History  Problem Relation Age of Onset  . CAD Mother   . CAD Father     Social History Social History  Substance Use Topics  . Smoking status: Never Smoker  . Smokeless tobacco: Never Used  . Alcohol use No    Review of Systems Constitutional: No fever/chills Eyes: No visual changes. ENT: No sore throat. Cardiovascular: Denies chest pain. Respiratory: Denies shortness of breath. Gastrointestinal: No abdominal pain.  No nausea, no vomiting.  No diarrhea.  No constipation. Genitourinary: Negative for dysuria. Musculoskeletal: Negative for back pain. Skin: Negative for rash.Positive for scalp laceration Neurological: Negative for headaches, focal weakness or numbness.  10-point ROS otherwise negative.  ____________________________________________   PHYSICAL EXAM:  VITAL SIGNS: ED Triage Vitals  Enc Vitals Group     BP 01/21/17 0147 (!) 153/83     Pulse Rate 01/21/17 0147 69     Resp 01/21/17 0147 16     Temp 01/21/17 0147 97.7 F (36.5 C)     Temp Source 01/21/17 0147 Oral     SpO2 01/21/17 0147 98 %     Weight 01/21/17 0148 188 lb (85.3 kg)     Height 01/21/17 0148 5\' 7"  (1.702 m)     Head Circumference --      Peak Flow --      Pain Score --      Pain Loc --      Pain Edu? --      Excl. in GC? --     Constitutional: Alert and oriented. Well appearing and in no acute distress. Eyes: Conjunctivae are normal. PERRL. EOMI. Head: One-inch linear laceration to the right parietal scalp. Ears:  Healthy appearing ear canals and TMs bilaterally Nose: No congestion/rhinnorhea. Mouth/Throat: Mucous membranes are moist.  Oropharynx non-erythematous. Neck: No stridor.  No cervical spine tenderness to  palpation. Cardiovascular: Normal rate, regular rhythm. Good peripheral circulation. Grossly normal heart sounds. Respiratory: Normal respiratory effort.  No retractions. Lungs CTAB. Gastrointestinal: Soft and nontender. No distention.  Musculoskeletal: No lower extremity tenderness nor edema. No gross deformities of extremities. Neurologic:  Normal speech and language. No gross focal neurologic deficits are appreciated.  Skin:  Skin is warm, dry and intact. No rash noted. Psychiatric: Mood and affect are normal. Speech and behavior are normal.   RADIOLOGY I,  N BROWN, personally viewed and evaluated these images (plain radiographs) as part of my medical decision making, as well as reviewing the written report by the radiologist.  Ct Head Wo Contrast  Result Date: 01/21/2017 CLINICAL DATA:  Unwitnessed fall in the bathroom at home. Right facial ecchymosis. EXAM: CT HEAD WITHOUT CONTRAST CT CERVICAL SPINE WITHOUT CONTRAST TECHNIQUE: Multidetector CT imaging of the head and cervical spine was performed following the standard protocol without intravenous contrast. Multiplanar CT image reconstructions of the cervical spine were also generated. COMPARISON:  01/17/2017 FINDINGS: CT HEAD FINDINGS Brain: There is no intracranial hemorrhage, mass or evidence of acute infarction. There is mild generalized atrophy. There is mild chronic microvascular ischemic change. There is no significant extra-axial fluid collection. No acute intracranial findings are evident. Vascular: No hyperdense vessel or unexpected calcification. Skull: Normal. Negative for fracture or focal lesion. Sinuses/Orbits: No acute finding. Other: Right parietal convexity scalp hematoma. CT CERVICAL SPINE FINDINGS Alignment: Normal. Skull base and vertebrae: No acute fracture. No primary bone lesion or focal pathologic process. Soft tissues and spinal canal: No prevertebral fluid or swelling. No visible canal hematoma. Disc levels:  Moderate degenerative cervical disc disease at C5-6. Facet articulations are mildly arthritic but intact. Upper chest: Negative. Other: None IMPRESSION: 1. No acute intracranial findings. There is mild generalized atrophy and chronic small vessel ischemic disease. 2. Negative for acute cervical spine fracture. Electronically Signed   By: Ellery Plunk M.D.   On: 01/21/2017 02:25   Ct Cervical Spine Wo Contrast  Result Date: 01/21/2017 CLINICAL DATA:  Unwitnessed fall in the bathroom at home. Right facial ecchymosis. EXAM: CT HEAD WITHOUT CONTRAST CT  CERVICAL SPINE WITHOUT CONTRAST TECHNIQUE: Multidetector CT imaging of the head and cervical spine was performed following the standard protocol without intravenous contrast. Multiplanar CT image reconstructions of the cervical spine were also generated. COMPARISON:  01/17/2017 FINDINGS: CT HEAD FINDINGS Brain: There is no intracranial hemorrhage, mass or evidence of acute infarction. There is mild generalized atrophy. There is mild chronic microvascular ischemic change. There is no significant extra-axial fluid collection. No acute intracranial findings are evident. Vascular: No hyperdense vessel or unexpected calcification. Skull: Normal. Negative for fracture or focal lesion. Sinuses/Orbits: No acute finding. Other: Right parietal convexity scalp hematoma. CT CERVICAL SPINE FINDINGS Alignment: Normal. Skull base and vertebrae: No acute fracture. No primary bone lesion or focal pathologic process. Soft tissues and spinal canal: No prevertebral fluid or swelling. No visible canal hematoma. Disc levels: Moderate degenerative cervical disc disease at C5-6. Facet articulations are mildly arthritic but intact. Upper chest: Negative. Other: None IMPRESSION: 1. No acute intracranial findings. There is mild generalized atrophy and chronic small vessel ischemic disease. 2. Negative for acute cervical spine fracture. Electronically Signed   By: Ellery Plunk M.D.    On: 01/21/2017 02:25    .Marland KitchenLaceration Repair Date/Time: 01/21/2017 3:16 AM Performed by: Darci Current Authorized by: Darci Current   Consent:    Consent obtained:  Verbal   Consent given by:  Patient   Risks discussed:  Infection and pain   Alternatives discussed:  No treatment Anesthesia (see MAR for exact dosages):    Anesthesia method:  Local infiltration   Local anesthetic:  Lidocaine 1% w/o epi Laceration details:    Location:  Scalp   Scalp location:  R parietal   Length (cm):  3   Depth (mm):  3 Repair type:    Repair type:  Simple Pre-procedure details:    Preparation:  Patient was prepped and draped in usual sterile fashion Treatment:    Area cleansed with:  Saline and Betadine   Visualized foreign bodies/material removed: no   Skin repair:    Repair method:  Staples   Number of staples:  5 Approximation:    Approximation:  Close   Vermilion border: well-aligned   Post-procedure details:    Dressing:  Sterile dressing and tube gauze   Patient tolerance of procedure:  Tolerated well, no immediate complications      INITIAL IMPRESSION / ASSESSMENT AND PLAN / ED COURSE  Pertinent labs & imaging results that were available during my care of the patient were reviewed by me and considered in my medical decision making (see chart for details).  71 year old female presenting with history of slip and fall with resultant occipital parietal laceration. Wound repaired with staples patient tolerated procedure well      ____________________________________________  FINAL CLINICAL IMPRESSION(S) / ED DIAGNOSES  Final diagnoses:  Laceration of scalp, initial encounter     MEDICATIONS GIVEN DURING THIS VISIT:  Medications  lidocaine (PF) (XYLOCAINE) 1 % injection (not administered)     NEW OUTPATIENT MEDICATIONS STARTED DURING THIS VISIT:  New Prescriptions   No medications on file    Modified Medications   No medications on file     Discontinued Medications   No medications on file     Note:  This document was prepared using Dragon voice recognition software and may include unintentional dictation errors.    Darci Current, MD 01/21/17 [phone]

## 2017-01-22 LAB — CULTURE, BLOOD (ROUTINE X 2): Culture: NO GROWTH

## 2017-01-29 DIAGNOSIS — Z4802 Encounter for removal of sutures: Secondary | ICD-10-CM | POA: Diagnosis not present

## 2017-01-29 DIAGNOSIS — L97321 Non-pressure chronic ulcer of left ankle limited to breakdown of skin: Secondary | ICD-10-CM | POA: Diagnosis not present

## 2017-02-11 ENCOUNTER — Emergency Department
Admission: EM | Admit: 2017-02-11 | Discharge: 2017-02-11 | Disposition: A | Discharge status: Home / Self Care | Payer: PPO | Attending: Emergency Medicine | Admitting: Emergency Medicine

## 2017-02-11 ENCOUNTER — Emergency Department: Payer: PPO

## 2017-02-11 ENCOUNTER — Encounter: Payer: Self-pay | Admitting: Emergency Medicine

## 2017-02-11 DIAGNOSIS — Z79899 Other long term (current) drug therapy: Secondary | ICD-10-CM | POA: Insufficient documentation

## 2017-02-11 DIAGNOSIS — Z794 Long term (current) use of insulin: Secondary | ICD-10-CM | POA: Insufficient documentation

## 2017-02-11 DIAGNOSIS — R51 Headache: Secondary | ICD-10-CM | POA: Diagnosis not present

## 2017-02-11 DIAGNOSIS — W19XXXA Unspecified fall, initial encounter: Secondary | ICD-10-CM

## 2017-02-11 DIAGNOSIS — Y939 Activity, unspecified: Secondary | ICD-10-CM | POA: Insufficient documentation

## 2017-02-11 DIAGNOSIS — Y999 Unspecified external cause status: Secondary | ICD-10-CM | POA: Insufficient documentation

## 2017-02-11 DIAGNOSIS — Z7982 Long term (current) use of aspirin: Secondary | ICD-10-CM | POA: Diagnosis not present

## 2017-02-11 DIAGNOSIS — E119 Type 2 diabetes mellitus without complications: Secondary | ICD-10-CM | POA: Diagnosis not present

## 2017-02-11 DIAGNOSIS — Y929 Unspecified place or not applicable: Secondary | ICD-10-CM | POA: Insufficient documentation

## 2017-02-11 DIAGNOSIS — F1120 Opioid dependence, uncomplicated: Secondary | ICD-10-CM | POA: Diagnosis not present

## 2017-02-11 DIAGNOSIS — I2510 Atherosclerotic heart disease of native coronary artery without angina pectoris: Secondary | ICD-10-CM | POA: Diagnosis not present

## 2017-02-11 DIAGNOSIS — F1320 Sedative, hypnotic or anxiolytic dependence, uncomplicated: Secondary | ICD-10-CM | POA: Insufficient documentation

## 2017-02-11 DIAGNOSIS — W1839XA Other fall on same level, initial encounter: Secondary | ICD-10-CM | POA: Diagnosis not present

## 2017-02-11 DIAGNOSIS — F1110 Opioid abuse, uncomplicated: Secondary | ICD-10-CM

## 2017-02-11 DIAGNOSIS — S0990XA Unspecified injury of head, initial encounter: Secondary | ICD-10-CM

## 2017-02-11 DIAGNOSIS — I10 Essential (primary) hypertension: Secondary | ICD-10-CM | POA: Diagnosis not present

## 2017-02-11 LAB — COMPREHENSIVE METABOLIC PANEL
ALK PHOS: 53 U/L (ref 38–126)
ALT: 25 U/L (ref 14–54)
AST: 133 U/L — ABNORMAL HIGH (ref 15–41)
Albumin: 3.7 g/dL (ref 3.5–5.0)
Anion gap: 7 (ref 5–15)
BILIRUBIN TOTAL: 0.3 mg/dL (ref 0.3–1.2)
BUN: 28 mg/dL — ABNORMAL HIGH (ref 6–20)
CALCIUM: 8.7 mg/dL — AB (ref 8.9–10.3)
CO2: 29 mmol/L (ref 22–32)
CREATININE: 0.8 mg/dL (ref 0.44–1.00)
Chloride: 104 mmol/L (ref 101–111)
Glucose, Bld: 153 mg/dL — ABNORMAL HIGH (ref 65–99)
Potassium: 4.4 mmol/L (ref 3.5–5.1)
Sodium: 140 mmol/L (ref 135–145)
TOTAL PROTEIN: 6 g/dL — AB (ref 6.5–8.1)

## 2017-02-11 LAB — URINALYSIS, ROUTINE W REFLEX MICROSCOPIC
Bacteria, UA: NONE SEEN
Bilirubin Urine: NEGATIVE
Glucose, UA: NEGATIVE mg/dL
HGB URINE DIPSTICK: NEGATIVE
Ketones, ur: NEGATIVE mg/dL
Nitrite: NEGATIVE
PH: 5 (ref 5.0–8.0)
Protein, ur: NEGATIVE mg/dL
RBC / HPF: NONE SEEN RBC/hpf (ref 0–5)
SPECIFIC GRAVITY, URINE: 1.011 (ref 1.005–1.030)

## 2017-02-11 LAB — CBC WITH DIFFERENTIAL/PLATELET
Basophils Absolute: 0 10*3/uL (ref 0–0.1)
Basophils Relative: 1 %
Eosinophils Absolute: 0.2 10*3/uL (ref 0–0.7)
Eosinophils Relative: 2 %
HCT: 32.4 % — ABNORMAL LOW (ref 35.0–47.0)
HEMOGLOBIN: 11.1 g/dL — AB (ref 12.0–16.0)
LYMPHS ABS: 2.3 10*3/uL (ref 1.0–3.6)
LYMPHS PCT: 29 %
MCH: 35.1 pg — AB (ref 26.0–34.0)
MCHC: 34.4 g/dL (ref 32.0–36.0)
MCV: 101.9 fL — AB (ref 80.0–100.0)
MONOS PCT: 12 %
Monocytes Absolute: 0.9 10*3/uL (ref 0.2–0.9)
NEUTROS PCT: 56 %
Neutro Abs: 4.5 10*3/uL (ref 1.4–6.5)
Platelets: 227 10*3/uL (ref 150–440)
RBC: 3.18 MIL/uL — AB (ref 3.80–5.20)
RDW: 15.5 % — ABNORMAL HIGH (ref 11.5–14.5)
WBC: 8 10*3/uL (ref 3.6–11.0)

## 2017-02-11 LAB — TROPONIN I

## 2017-02-11 LAB — MAGNESIUM: MAGNESIUM: 2.2 mg/dL (ref 1.7–2.4)

## 2017-02-11 MED ORDER — SODIUM CHLORIDE 0.9 % IV BOLUS (SEPSIS)
500.0000 mL | INTRAVENOUS | Status: AC
  Administered 2017-02-11: 500 mL via INTRAVENOUS

## 2017-02-11 NOTE — ED Triage Notes (Signed)
Per [facility]: [facility] d/t 3x fall today, reporting left head and buttock pain. Pt on heavy med doses d/t chronic pain.   Pt taking 30mg  XR ms contin, 15 mg breakthrough morphine, 3xanax/day, klonopin as well dosage unknown

## 2017-02-11 NOTE — ED Notes (Signed)
Patient given phone to attempt to call family for possible transportation if discharged.

## 2017-02-11 NOTE — ED Provider Notes (Signed)
-----------------------------------------   12:33 PM on 02/11/2017 -----------------------------------------   Blood pressure (!) 106/53, pulse 62, temperature 97.4 F (36.3 C), resp. rate 11, height 5\' 7"  (1.702 m), weight 171 lb (77.6 kg), SpO2 98 %.  Assuming care from Dr. York CeriseForbach In short, Jessica Donovan is a 71 y.o. female with a chief complaint of Fall .  Refer to the original H&P for additional details.  The current plan of care is to [facility]Forbach   Clinical Course as of Feb 12 1232  Thu Feb 11, 2017  [id number] Reassuring head CT without evidence of acute/emergent findings. CT HEAD WO CONTRAST [CF]  0717 Labs unremarkable, patient still extremely somnolent.  Transferring care to Dr. Huel CoteQuigley to monitor and reassess when she is more awake.  [CF]    Clinical Course User Index [CF] Loleta Roseory Forbach, MD   Patient was still examined from her fall. She does not appear to have any significant injury. Review of her medication list shows numerous narcotic prescriptions and medications that would make her very drowsy. She was observed here for a significant period of time in emergency department and felt her altered mental status is secondary to narcosis. The patient was given instructions on opioid abuse and advised to discuss with her primary physician and pain control outside of narcotic pain medications.   Jessica MoccasinBrian S Quigley, MD 02/11/17 1235

## 2017-02-11 NOTE — Discharge Instructions (Signed)
Please return immediately if condition worsens. Please contact her primary physician or the physician you were given for referral. If you have any specialist physicians involved in her treatment and plan please also contact them. Thank you for using [place] [facility] emergency Department. ° °

## 2017-02-11 NOTE — ED Provider Notes (Signed)
[facility] Emergency Department Provider Note  ____________________________________________   First MD Initiated Contact with Patient 02/11/17 [id number]     (approximate)  I have reviewed the triage vital signs and the nursing notes.   HISTORY  Chief Complaint Fall    HPI Jessica Donovan is a 71 y.o. female with a history of frequent falls and use of heavy doses of chronic pain medicine and benzodiazepines who presents by EMS for evaluation after level falls.  She lives in an independent living facility and has a life alert monitor.  After her third or fourth fall she was told that she had to go to the emergency department for evaluation.  She reports some pain in the left side of her head and does seem to have a bruise on that side although it is unclear if it is acute.  She is also complaining of pain over her entire body but she did clarify after I asked that that is normal for her.  She did not tell me about any hip pain but she did report left hip pain to the nurse.  She denies chest pain, shortness of breath, nausea, vomiting, abdominal pain.  She appears quite somnolent and came in with her head slumped forward and is slurring her speech when talking but she is answering questions appropriately.  Past Medical History:  Diagnosis Date  . Chronic back pain   . Collagen vascular disease (HCC)   . Coronary artery disease   . Diabetes mellitus without complication (HCC)   . Hypertension   . Low back pain   . Myocardial infarction   . Osteomyelitis of toe (HCC) 01/23/2016  . Stroke [facility])     Patient Active Problem List   Diagnosis Date Noted  . Acute colitis 01/19/2017  . Sepsis (HCC) 01/17/2017  . Cellulitis of fourth toe of right foot 04/10/2016  . Somnolence 04/09/2016  . Dementia 04/09/2016  . Cellulitis 04/03/2016  . Hematoma of right parietal scalp 02/23/2016  . Multiple falls 02/23/2016  . Physical debility 02/23/2016  . DM2 (diabetes mellitus,  type 2) (HCC) 02/23/2016  . Syncope and collapse 02/23/2016  . Polypharmacy 02/23/2016  . Osteomyelitis of toe (HCC) 01/23/2016  . Rhabdomyolysis 12/07/2015  . ARF (acute renal failure) (HCC) 11/29/2015  . Hypotension 11/29/2015  . 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 Angina effort (HCC) 11/07/2015  . Osteomyelitis (HCC) 11/07/2015    Past Surgical History:  Procedure Laterality Date  . AMPUTATION TOE Left 11/10/2015   Procedure: AMPUTATION TOE;  Surgeon: Linus Galas, MD;  Location: [facility] ORS;  Service: Podiatry;  Laterality: Left;  . AMPUTATION TOE Left 01/24/2016   Procedure: AMPUTATION TOE (2nd mpj);  Surgeon: Linus Galas, DPM;  Location: [facility] ORS;  Service: Podiatry;  Laterality: Left;  . CARDIAC CATHETERIZATION N/A 11/12/2015   Procedure: Left Heart Cath and Coronary Angiography;  Surgeon: Marcina Millard, MD;  Location: [facility] INVASIVE CV LAB;  Service: Cardiovascular;  Laterality: N/A;  . TOE AMPUTATION      Prior to Admission medications   Medication Sig Start Date End Date Taking? Authorizing Provider  acetaminophen (TYLENOL) 500 MG tablet Take 1,000 mg by mouth every 4 (four) hours as needed for mild pain or headache.    Yes Historical Provider, MD  ALPRAZolam Prudy Feeler) 1 MG tablet Take 1 mg by mouth 3 (three) times daily. 02/08/17  Yes Historical Provider, MD  aspirin EC 81 MG tablet Take 81 mg by mouth daily.   Yes Historical Provider, MD  atorvastatin (LIPITOR) 20 MG tablet Take 20 mg by mouth daily.   Yes Historical Provider, MD  Biotin 5 MG CAPS Take 5 mg by mouth daily at 12 noon.    Yes Historical Provider, MD  celecoxib (CELEBREX) 200 MG capsule Take 1 capsule by mouth daily. 01/28/17  Yes Historical Provider, MD  cyclobenzaprine (FLEXERIL) 10 MG tablet Take 1 tablet (10 mg total) by mouth 3 (three) times daily as needed for muscle spasms. 01/27/16  Yes Enid Baasadhika Kalisetti, MD  ferrous sulfate 325 (65 FE) MG tablet Take 325 mg by mouth daily with breakfast.   Yes  Historical Provider, MD  furosemide (LASIX) 20 MG tablet Take 20 mg by mouth daily.   Yes Historical Provider, MD  insulin aspart (NOVOLOG) 100 UNIT/ML injection Inject 3-15 Units into the skin 3 (three) times daily with meals as needed for high blood sugar. Pt uses as needed per sliding scale:    Less than 140:  0 units  140-180:  3 units 181-220:  4 units 221- 260:  6 units 261- 320:  8 units 321-360:  10 units 361-400:  12 units Greater than 400:  15 units   Yes Historical Provider, MD  Insulin Glargine (TOUJEO SOLOSTAR) 300 UNIT/ML SOPN Inject 60 Units into the skin daily.    Yes Historical Provider, MD  isosorbide mononitrate (IMDUR) 30 MG 24 hr tablet Take 30 mg by mouth daily.    Yes Historical Provider, MD  lisinopril (PRINIVIL,ZESTRIL) 20 MG tablet Take 20 mg by mouth daily.   Yes Historical Provider, MD  metoprolol (LOPRESSOR) 50 MG tablet Take 25 mg by mouth 2 (two) times daily.    Yes Historical Provider, MD  mirtazapine (REMERON) 15 MG tablet Take 15 mg by mouth at bedtime.   Yes Historical Provider, MD  morphine (MSIR) 15 MG tablet Take 1 tablet (15 mg total) by mouth every 6 (six) hours as needed for severe pain. 01/19/17  Yes Srikar Sudini, MD  Morphine Sulfate ER 30 MG TBEA Take 30 mg by mouth 2 (two) times daily. 01/19/17  Yes Srikar Sudini, MD  Multiple Vitamin (MULTIVITAMIN WITH MINERALS) TABS tablet Take 1 tablet by mouth daily.   Yes Historical Provider, MD  nitrofurantoin, macrocrystal-monohydrate, (MACROBID) 100 MG capsule Take 100 mg by mouth daily.   Yes Historical Provider, MD  nitroGLYCERIN (NITROSTAT) 0.4 MG SL tablet Place 0.4 mg under the tongue every 5 (five) minutes as needed for chest pain. Reported on 04/16/2016   Yes Historical Provider, MD  omeprazole (PRILOSEC) 20 MG capsule Take 20 mg by mouth daily.   Yes Historical Provider, MD  pregabalin (LYRICA) 150 MG capsule Take 1 capsule (150 mg total) by mouth 2 (two) times daily. 01/19/17  Yes Srikar Sudini, MD    ticagrelor (BRILINTA) 90 MG TABS tablet Take 90 mg by mouth 2 (two) times daily.   Yes Historical Provider, MD  tolterodine (DETROL LA) 4 MG 24 hr capsule Take 4 mg by mouth daily.   Yes Historical Provider, MD  traZODone (DESYREL) 100 MG tablet Take 100 mg by mouth at bedtime. 01/28/17  Yes Historical Provider, MD  venlafaxine XR (EFFEXOR-XR) 150 MG 24 hr capsule Take 150 mg by mouth daily with breakfast.   Yes Historical Provider, MD  vitamin B-12 (CYANOCOBALAMIN) 1000 MCG tablet Take 1,000 mcg by mouth daily.   Yes Historical Provider, MD  Vitamin D, Ergocalciferol, (DRISDOL) 50000 units CAPS capsule Take 50,000 Units by mouth every 7 (seven) days. Pt takes on Sunday.  Yes Historical Provider, MD  ciprofloxacin (CIPRO) 500 MG tablet Take 1 tablet (500 mg total) by mouth 2 (two) times daily. Patient not taking: Reported on 02/11/2017 01/19/17   Milagros Loll, MD  metroNIDAZOLE (FLAGYL) 500 MG tablet Take 1 tablet (500 mg total) by mouth every 8 (eight) hours. Patient not taking: Reported on 02/11/2017 01/19/17   Milagros Loll, MD    Allergies Codeine; Influenza vaccines; Methadone; Percocet [oxycodone-acetaminophen]; Tetanus toxoids; and Tetracyclines & related  Family History  Problem Relation Age of Onset  . CAD Mother   . CAD Father     Social History Social History  Substance Use Topics  . Smoking status: Never Smoker  . Smokeless tobacco: Never Used  . Alcohol use No    Review of Systems Constitutional: No fever/chills Eyes: No visual changes. ENT: No sore throat. Cardiovascular: Denies chest pain. Respiratory: Denies shortness of breath. Gastrointestinal: No abdominal pain.  No nausea, no vomiting.  No diarrhea.  No constipation. Genitourinary: Negative for dysuria. Musculoskeletal: Chronic pain all over.  Acute pain in left side of head and possibly left hip Skin: Negative for rash. Neurological: Negative for headaches, focal weakness or numbness.  10-point ROS  otherwise negative.  ____________________________________________   PHYSICAL EXAM:  VITAL SIGNS: ED Triage Vitals  Enc Vitals Group     BP 02/11/17 0440 (!) 87/59     Pulse Rate 02/11/17 0440 67     Resp 02/11/17 0440 14     Temp 02/11/17 0440 97.4 F (36.3 C)     Temp src --      SpO2 02/11/17 0440 97 %     Weight 02/11/17 0441 171 lb (77.6 kg)     Height 02/11/17 0441 5\' 7"  (1.702 m)     Head Circumference --      Peak Flow --      Pain Score 02/11/17 0442 Asleep     Pain Loc --      Pain Edu? --      Excl. in GC? --     Constitutional: Answers questions but somnolent and slurring speech Eyes: Conjunctivae are normal. PERRL. EOMI. Head: Atraumatic. Nose: No congestion/rhinnorhea. Mouth/Throat: Mucous membranes are moist. Neck: No stridor.  No meningeal signs.  No cervical spine tenderness to palpation. Cardiovascular: Normal rate, regular rhythm. Good peripheral circulation. Grossly normal heart sounds. Respiratory: Normal respiratory effort.  No retractions. Lungs CTAB. Gastrointestinal: Soft and nontender. No distention.  Musculoskeletal: No lower extremity tenderness nor edema. No gross deformities of extremities.  I pressed on her pelvis and it was nontender.  I passively ranged all of her joints in her arms and her legs and there is no reproducible tenderness or pain with movement. Neurologic:  Slurred speech but appropriate language. No gross focal neurologic deficits are appreciated.  Moving All 4 extremities Skin:  Skin is warm, dry and intact. No rash noted.   ____________________________________________   LABS (all labs ordered are listed, but only abnormal results are displayed)  Labs Reviewed  URINALYSIS, ROUTINE W REFLEX MICROSCOPIC - Abnormal; Notable for the following:       Result Value   Color, Urine YELLOW (*)    APPearance CLEAR (*)    Leukocytes, UA TRACE (*)    Squamous Epithelial / LPF 0-5 (*)    All other components within normal limits   CBC WITH DIFFERENTIAL/PLATELET - Abnormal; Notable for the following:    RBC 3.18 (*)    Hemoglobin 11.1 (*)    HCT 32.4 (*)  MCV 101.9 (*)    MCH 35.1 (*)    RDW 15.5 (*)    All other components within normal limits  COMPREHENSIVE METABOLIC PANEL - Abnormal; Notable for the following:    Glucose, Bld 153 (*)    BUN 28 (*)    Calcium 8.7 (*)    Total Protein 6.0 (*)    AST 133 (*)    All other components within normal limits  URINE CULTURE  TROPONIN I  MAGNESIUM   ____________________________________________  EKG  ED ECG REPORT I, Obie Kallenbach, the attending physician, personally viewed and interpreted this ECG.  Date: 02/11/2017 EKG Time: 6:00 AM Rate: 66 Rhythm: normal sinus rhythm QRS Axis: normal Intervals: normal ST/T Wave abnormalities: Non-specific ST segment / T-wave changes, but no evidence of acute ischemia. Conduction Disturbances: none Narrative Interpretation: unremarkable  ____________________________________________  RADIOLOGY   Ct Head Wo Contrast  Result Date: 02/11/2017 CLINICAL DATA:  71 year old female with fall and left-sided head pain. EXAM: CT HEAD WITHOUT CONTRAST TECHNIQUE: Contiguous axial images were obtained from the base of the skull through the vertex without intravenous contrast. COMPARISON:  Head CT dated 01/21/2017 FINDINGS: Brain: The ventricles and sulci appropriate in size for patient's age. Mild periventricular and deep white matter chronic microvascular ischemic changes noted. There is no acute intracranial hemorrhage. No mass effect or midline shift noted. No extra-axial fluid collection. Vascular: No hyperdense vessel or unexpected calcification. Skull: Normal. Negative for fracture or focal lesion. Sinuses/Orbits: No acute finding. Other: Small left forehead hematoma. IMPRESSION: No acute intracranial hemorrhage. Electronically Signed   By: Elgie Collard M.D.   On: 02/11/2017 06:08     ____________________________________________   PROCEDURES  Procedure(s) performed:   Procedures   Critical Care performed: No ____________________________________________   INITIAL IMPRESSION / ASSESSMENT AND PLAN / ED COURSE  Pertinent labs & imaging results that were available during my care of the patient were reviewed by me and considered in my medical decision making (see chart for details).  I reviewed the patient's prescription history over the last 12 months in the [place] Controlled Substances Database, and she takes very large quantities of extended-release morphine, immediate release morphine, and Xanax daily.  When I pointed out to her that the medication she is on could be responsible for her falls and her sleepiness, she said that she knows that and that her doctor has cut her back from 4 times a day on the Xanax 1 mg tablets to 3 times a day.  I took photos of the drug database report and uploaded them into the Media tab within Chart Review.  I think the most likely her falls and somnolence are a result of excessive opioids and excessive benzodiazepines, particularly at age 55, but since she is reporting left-sided headache status post fall with a small bruise on her head, I will obtain a CT scan without contrast of her head.  She was hypotensive upon arrival, again likely a result of drugs, but I will place an IV, check basic labs, check a urinalysis for signs of infection, and given a small fluid bolus.  We will watch her on a monitor and pulse oximeter.   Clinical Course as of Feb 11 718  Thu Feb 11, 2017  0619 Reassuring head CT without evidence of acute/emergent findings. CT HEAD WO CONTRAST [CF]  0717 Labs unremarkable, patient still extremely somnolent.  Transferring care to Dr. Huel Cote to monitor and reassess when she is more awake.  [CF]    Clinical  Course User Index [CF] Loleta Rose, MD    ____________________________________________  FINAL CLINICAL  IMPRESSION(S) / ED DIAGNOSES  Final diagnoses:  Fall, initial encounter  Minor head injury, initial encounter  Uncomplicated opioid dependence (HCC)  Benzodiazepine dependence, continuous (HCC)     MEDICATIONS GIVEN DURING THIS VISIT:  Medications  sodium chloride 0.9 % bolus 500 mL (not administered)     NEW OUTPATIENT MEDICATIONS STARTED DURING THIS VISIT:  New Prescriptions   No medications on file    Modified Medications   No medications on file    Discontinued Medications   ALPRAZOLAM (XANAX) 0.25 MG TABLET    Take 1 tablet (0.25 mg total) by mouth 2 (two) times daily as needed for anxiety or sleep.   MUPIROCIN CREAM (BACTROBAN) 2 %    Apply topically 2 (two) times daily.     Note:  This document was prepared using Dragon voice recognition software and may include unintentional dictation errors.    Loleta Rose, MD 02/11/17 [phone]

## 2017-02-12 LAB — URINE CULTURE
CULTURE: NO GROWTH
Special Requests: NORMAL

## 2017-02-23 ENCOUNTER — Emergency Department: Payer: PPO

## 2017-02-23 ENCOUNTER — Inpatient Hospital Stay
Admission: EM | Admit: 2017-02-23 | Discharge: 2017-03-01 | DRG: 917 | Disposition: A | Discharge status: Home / Self Care | Payer: PPO | Attending: Internal Medicine | Admitting: Internal Medicine

## 2017-02-23 ENCOUNTER — Encounter: Payer: Self-pay | Admitting: Emergency Medicine

## 2017-02-23 DIAGNOSIS — I10 Essential (primary) hypertension: Secondary | ICD-10-CM | POA: Diagnosis present

## 2017-02-23 DIAGNOSIS — Z887 Allergy status to serum and vaccine status: Secondary | ICD-10-CM

## 2017-02-23 DIAGNOSIS — F064 Anxiety disorder due to known physiological condition: Secondary | ICD-10-CM | POA: Diagnosis not present

## 2017-02-23 DIAGNOSIS — Z8673 Personal history of transient ischemic attack (TIA), and cerebral infarction without residual deficits: Secondary | ICD-10-CM | POA: Diagnosis not present

## 2017-02-23 DIAGNOSIS — T402X1A Poisoning by other opioids, accidental (unintentional), initial encounter: Secondary | ICD-10-CM

## 2017-02-23 DIAGNOSIS — I739 Peripheral vascular disease, unspecified: Secondary | ICD-10-CM | POA: Diagnosis not present

## 2017-02-23 DIAGNOSIS — M6282 Rhabdomyolysis: Secondary | ICD-10-CM | POA: Diagnosis not present

## 2017-02-23 DIAGNOSIS — Z79899 Other long term (current) drug therapy: Secondary | ICD-10-CM

## 2017-02-23 DIAGNOSIS — Z743 Need for continuous supervision: Secondary | ICD-10-CM | POA: Diagnosis not present

## 2017-02-23 DIAGNOSIS — E876 Hypokalemia: Secondary | ICD-10-CM | POA: Diagnosis not present

## 2017-02-23 DIAGNOSIS — Z7982 Long term (current) use of aspirin: Secondary | ICD-10-CM

## 2017-02-23 DIAGNOSIS — G8929 Other chronic pain: Secondary | ICD-10-CM | POA: Diagnosis present

## 2017-02-23 DIAGNOSIS — E119 Type 2 diabetes mellitus without complications: Secondary | ICD-10-CM | POA: Diagnosis not present

## 2017-02-23 DIAGNOSIS — R4182 Altered mental status, unspecified: Secondary | ICD-10-CM | POA: Diagnosis not present

## 2017-02-23 DIAGNOSIS — I21A9 Other myocardial infarction type: Secondary | ICD-10-CM | POA: Diagnosis not present

## 2017-02-23 DIAGNOSIS — Z79891 Long term (current) use of opiate analgesic: Secondary | ICD-10-CM | POA: Diagnosis not present

## 2017-02-23 DIAGNOSIS — E860 Dehydration: Secondary | ICD-10-CM | POA: Diagnosis present

## 2017-02-23 DIAGNOSIS — Z885 Allergy status to narcotic agent status: Secondary | ICD-10-CM | POA: Diagnosis not present

## 2017-02-23 DIAGNOSIS — G894 Chronic pain syndrome: Secondary | ICD-10-CM | POA: Diagnosis present

## 2017-02-23 DIAGNOSIS — M25512 Pain in left shoulder: Secondary | ICD-10-CM | POA: Diagnosis not present

## 2017-02-23 DIAGNOSIS — N390 Urinary tract infection, site not specified: Secondary | ICD-10-CM | POA: Diagnosis not present

## 2017-02-23 DIAGNOSIS — Z89422 Acquired absence of other left toe(s): Secondary | ICD-10-CM

## 2017-02-23 DIAGNOSIS — W19XXXA Unspecified fall, initial encounter: Secondary | ICD-10-CM | POA: Diagnosis not present

## 2017-02-23 DIAGNOSIS — M6281 Muscle weakness (generalized): Secondary | ICD-10-CM | POA: Diagnosis not present

## 2017-02-23 DIAGNOSIS — E785 Hyperlipidemia, unspecified: Secondary | ICD-10-CM | POA: Diagnosis not present

## 2017-02-23 DIAGNOSIS — W1830XA Fall on same level, unspecified, initial encounter: Secondary | ICD-10-CM | POA: Diagnosis not present

## 2017-02-23 DIAGNOSIS — I252 Old myocardial infarction: Secondary | ICD-10-CM

## 2017-02-23 DIAGNOSIS — N179 Acute kidney failure, unspecified: Secondary | ICD-10-CM | POA: Diagnosis present

## 2017-02-23 DIAGNOSIS — G9341 Metabolic encephalopathy: Secondary | ICD-10-CM | POA: Diagnosis not present

## 2017-02-23 DIAGNOSIS — F329 Major depressive disorder, single episode, unspecified: Secondary | ICD-10-CM | POA: Diagnosis present

## 2017-02-23 DIAGNOSIS — D81818 Other biotin-dependent carboxylase deficiency: Secondary | ICD-10-CM | POA: Diagnosis not present

## 2017-02-23 DIAGNOSIS — Z5189 Encounter for other specified aftercare: Secondary | ICD-10-CM | POA: Diagnosis not present

## 2017-02-23 DIAGNOSIS — I959 Hypotension, unspecified: Secondary | ICD-10-CM | POA: Diagnosis not present

## 2017-02-23 DIAGNOSIS — R0689 Other abnormalities of breathing: Secondary | ICD-10-CM

## 2017-02-23 DIAGNOSIS — M62838 Other muscle spasm: Secondary | ICD-10-CM | POA: Diagnosis not present

## 2017-02-23 DIAGNOSIS — D518 Other vitamin B12 deficiency anemias: Secondary | ICD-10-CM | POA: Diagnosis not present

## 2017-02-23 DIAGNOSIS — Z794 Long term (current) use of insulin: Secondary | ICD-10-CM | POA: Diagnosis not present

## 2017-02-23 DIAGNOSIS — R404 Transient alteration of awareness: Secondary | ICD-10-CM | POA: Diagnosis not present

## 2017-02-23 DIAGNOSIS — S0990XA Unspecified injury of head, initial encounter: Secondary | ICD-10-CM | POA: Diagnosis not present

## 2017-02-23 DIAGNOSIS — E611 Iron deficiency: Secondary | ICD-10-CM | POA: Diagnosis not present

## 2017-02-23 DIAGNOSIS — T50904A Poisoning by unspecified drugs, medicaments and biological substances, undetermined, initial encounter: Secondary | ICD-10-CM | POA: Diagnosis not present

## 2017-02-23 DIAGNOSIS — Z66 Do not resuscitate: Secondary | ICD-10-CM | POA: Diagnosis present

## 2017-02-23 DIAGNOSIS — S299XXA Unspecified injury of thorax, initial encounter: Secondary | ICD-10-CM | POA: Diagnosis not present

## 2017-02-23 DIAGNOSIS — R739 Hyperglycemia, unspecified: Secondary | ICD-10-CM | POA: Diagnosis not present

## 2017-02-23 DIAGNOSIS — Z881 Allergy status to other antibiotic agents status: Secondary | ICD-10-CM | POA: Diagnosis not present

## 2017-02-23 DIAGNOSIS — F339 Major depressive disorder, recurrent, unspecified: Secondary | ICD-10-CM | POA: Diagnosis not present

## 2017-02-23 DIAGNOSIS — N289 Disorder of kidney and ureter, unspecified: Secondary | ICD-10-CM | POA: Diagnosis not present

## 2017-02-23 DIAGNOSIS — I2510 Atherosclerotic heart disease of native coronary artery without angina pectoris: Secondary | ICD-10-CM | POA: Diagnosis present

## 2017-02-23 DIAGNOSIS — K219 Gastro-esophageal reflux disease without esophagitis: Secondary | ICD-10-CM | POA: Diagnosis not present

## 2017-02-23 DIAGNOSIS — S4992XA Unspecified injury of left shoulder and upper arm, initial encounter: Secondary | ICD-10-CM | POA: Diagnosis not present

## 2017-02-23 DIAGNOSIS — S199XXA Unspecified injury of neck, initial encounter: Secondary | ICD-10-CM | POA: Diagnosis not present

## 2017-02-23 DIAGNOSIS — E559 Vitamin D deficiency, unspecified: Secondary | ICD-10-CM | POA: Diagnosis not present

## 2017-02-23 LAB — URINALYSIS, COMPLETE (UACMP) WITH MICROSCOPIC
BACTERIA UA: NONE SEEN
BILIRUBIN URINE: NEGATIVE
GLUCOSE, UA: NEGATIVE mg/dL
KETONES UR: NEGATIVE mg/dL
Leukocytes, UA: NEGATIVE
Nitrite: NEGATIVE
PROTEIN: NEGATIVE mg/dL
Specific Gravity, Urine: 1.017 (ref 1.005–1.030)
pH: 5 (ref 5.0–8.0)

## 2017-02-23 LAB — COMPREHENSIVE METABOLIC PANEL
ALBUMIN: 3.5 g/dL (ref 3.5–5.0)
ALT: 15 U/L (ref 14–54)
ANION GAP: 8 (ref 5–15)
AST: 57 U/L — ABNORMAL HIGH (ref 15–41)
Alkaline Phosphatase: 51 U/L (ref 38–126)
BUN: 29 mg/dL — ABNORMAL HIGH (ref 6–20)
CO2: 28 mmol/L (ref 22–32)
CREATININE: 1.12 mg/dL — AB (ref 0.44–1.00)
Calcium: 8.7 mg/dL — ABNORMAL LOW (ref 8.9–10.3)
Chloride: 100 mmol/L — ABNORMAL LOW (ref 101–111)
GFR calc Af Amer: 56 mL/min — ABNORMAL LOW (ref >60)
GFR calc non Af Amer: 49 mL/min — ABNORMAL LOW (ref >60)
GLUCOSE: 154 mg/dL — AB (ref 65–99)
Potassium: 4.8 mmol/L (ref 3.5–5.1)
SODIUM: 136 mmol/L (ref 135–145)
Total Bilirubin: 0.4 mg/dL (ref 0.3–1.2)
Total Protein: 5.9 g/dL — ABNORMAL LOW (ref 6.5–8.1)

## 2017-02-23 LAB — AMMONIA: Ammonia: 9 umol/L (ref 9–35)

## 2017-02-23 LAB — CBC
HCT: 34.6 % — ABNORMAL LOW (ref 35.0–47.0)
Hemoglobin: 11.4 g/dL — ABNORMAL LOW (ref 12.0–16.0)
MCH: 33.8 pg (ref 26.0–34.0)
MCHC: 33 g/dL (ref 32.0–36.0)
MCV: 102.6 fL — ABNORMAL HIGH (ref 80.0–100.0)
Platelets: 241 10*3/uL (ref 150–440)
RBC: 3.37 MIL/uL — AB (ref 3.80–5.20)
RDW: 15 % — ABNORMAL HIGH (ref 11.5–14.5)
WBC: 7.1 10*3/uL (ref 3.6–11.0)

## 2017-02-23 LAB — URINE DRUG SCREEN, QUALITATIVE (ARMC ONLY)
Amphetamines, Ur Screen: POSITIVE — AB
BARBITURATES, UR SCREEN: NOT DETECTED
BENZODIAZEPINE, UR SCRN: POSITIVE — AB
CANNABINOID 50 NG, UR ~~LOC~~: NOT DETECTED
Cocaine Metabolite,Ur ~~LOC~~: NOT DETECTED
MDMA (Ecstasy)Ur Screen: NOT DETECTED
Methadone Scn, Ur: NOT DETECTED
Opiate, Ur Screen: POSITIVE — AB
PHENCYCLIDINE (PCP) UR S: NOT DETECTED
Tricyclic, Ur Screen: POSITIVE — AB

## 2017-02-23 LAB — GLUCOSE, CAPILLARY
Glucose-Capillary: 124 mg/dL — ABNORMAL HIGH (ref 65–99)
Glucose-Capillary: 115 mg/dL — ABNORMAL HIGH (ref 65–99)

## 2017-02-23 LAB — TROPONIN I: Troponin I: <0.03 ng/mL (ref <0.03)

## 2017-02-23 LAB — ETHANOL

## 2017-02-23 LAB — LACTIC ACID, PLASMA
LACTIC ACID, VENOUS: 1.6 mmol/L (ref 0.5–1.9)
LACTIC ACID, VENOUS: 1.2 mmol/L (ref 0.5–1.9)

## 2017-02-23 LAB — CK: Total CK: 2037 U/L — ABNORMAL HIGH (ref 38–234)

## 2017-02-23 LAB — MRSA PCR SCREENING: MRSA by PCR: NEGATIVE

## 2017-02-23 MED ORDER — SODIUM CHLORIDE 0.9% FLUSH
3.0000 mL | Freq: Two times a day (BID) | INTRAVENOUS | Status: DC
  Administered 2017-02-24 – 2017-03-01 (×9): 3 mL via INTRAVENOUS

## 2017-02-23 MED ORDER — NALOXONE HCL 2 MG/2ML IJ SOSY
2.0000 mg | PREFILLED_SYRINGE | Freq: Once | INTRAMUSCULAR | Status: AC
  Administered 2017-02-23: 2 mg via INTRAVENOUS
  Filled 2017-02-23: qty 2

## 2017-02-23 MED ORDER — INSULIN ASPART 100 UNIT/ML ~~LOC~~ SOLN
0.0000 [IU] | Freq: Three times a day (TID) | SUBCUTANEOUS | Status: DC
  Administered 2017-02-23 – 2017-02-24 (×2): 1 [IU] via SUBCUTANEOUS
  Administered 2017-02-24: 2 [IU] via SUBCUTANEOUS
  Administered 2017-02-28 – 2017-03-01 (×4): 1 [IU] via SUBCUTANEOUS
  Filled 2017-02-23: qty 1
  Filled 2017-02-23: qty 2
  Filled 2017-02-23 (×5): qty 1

## 2017-02-23 MED ORDER — SODIUM CHLORIDE 0.9 % IV BOLUS (SEPSIS)
1000.0000 mL | Freq: Once | INTRAVENOUS | Status: AC
  Administered 2017-02-23: 1000 mL via INTRAVENOUS

## 2017-02-23 MED ORDER — DEXTROSE 5 % IV SOLN
500.0000 mg | Freq: Once | INTRAVENOUS | Status: AC
  Administered 2017-02-23: 500 mg via INTRAVENOUS
  Filled 2017-02-23: qty 500

## 2017-02-23 MED ORDER — HEPARIN SODIUM (PORCINE) 5000 UNIT/ML IJ SOLN
5000.0000 [IU] | Freq: Three times a day (TID) | INTRAMUSCULAR | Status: DC
  Administered 2017-02-23 – 2017-02-24 (×3): 5000 [IU] via SUBCUTANEOUS
  Filled 2017-02-23 (×3): qty 1

## 2017-02-23 MED ORDER — DEXTROSE 5 % IV SOLN: 1.0000 g | Freq: Once | INTRAVENOUS | Status: DC

## 2017-02-23 MED ORDER — NALOXONE HCL 2 MG/2ML IJ SOSY
2.0000 mg | PREFILLED_SYRINGE | Freq: Once | INTRAMUSCULAR | Status: AC
  Administered 2017-02-23: 2 mg via INTRAVENOUS

## 2017-02-23 MED ORDER — SODIUM CHLORIDE 0.9 % IV SOLN
INTRAVENOUS | Status: DC
  Administered 2017-02-23 – 2017-02-26 (×5): via INTRAVENOUS

## 2017-02-23 MED ORDER — DOCUSATE SODIUM 100 MG PO CAPS
100.0000 mg | ORAL_CAPSULE | Freq: Two times a day (BID) | ORAL | Status: DC | PRN
  Filled 2017-02-23: qty 1

## 2017-02-23 MED ORDER — CEFTRIAXONE SODIUM-DEXTROSE 1-3.74 GM-% IV SOLR
1.0000 g | Freq: Once | INTRAVENOUS | Status: AC
  Administered 2017-02-23: 1 g via INTRAVENOUS
  Filled 2017-02-23: qty 50

## 2017-02-23 MED ORDER — NALOXONE HCL 2 MG/2ML IJ SOSY
PREFILLED_SYRINGE | INTRAMUSCULAR | Status: AC
  Filled 2017-02-23: qty 2

## 2017-02-23 MED ORDER — ONDANSETRON HCL 4 MG/2ML IJ SOLN
4.0000 mg | Freq: Once | INTRAMUSCULAR | Status: AC
  Administered 2017-02-23: 4 mg via INTRAVENOUS
  Filled 2017-02-23: qty 2

## 2017-02-23 NOTE — ED Notes (Signed)
Patient transported to CT 

## 2017-02-23 NOTE — ED Provider Notes (Addendum)
[facility] Emergency Department Provider Note  ____________________________________________  Time seen: Approximately 11:01 AM  I have reviewed the triage vital signs and the nursing notes.   HISTORY  Chief Complaint Fall    HPI Jessica Donovan is a 71 y.o. female the history of recurrent falls, HTN, CAD status post MI, presenting for a fall. On arrival to the emergency department, the patient is too somnolent to give any history or details. Per EMS report, the patient was found down on the ground in her apartment, and it is unclear how long she had been there. For EMS, the patient had a respiratory rate between 6 and 10, hypotension, but normal heart rate and temperature. Blood sugar was also normal.Per report, the patient has had multiple recurrent recent falls.   Past Medical History:  Diagnosis Date  . Chronic back pain   . Collagen vascular disease (HCC)   . Coronary artery disease   . Diabetes mellitus without complication (HCC)   . Hypertension   . Low back pain   . Myocardial infarction   . Osteomyelitis of toe (HCC) 01/23/2016  . Stroke [facility])     Patient Active Problem List   Diagnosis Date Noted  . Acute colitis 01/19/2017  . Sepsis (HCC) 01/17/2017  . Cellulitis of fourth toe of right foot 04/10/2016  . Somnolence 04/09/2016  . Dementia 04/09/2016  . Cellulitis 04/03/2016  . Hematoma of right parietal scalp 02/23/2016  . Multiple falls 02/23/2016  . Physical debility 02/23/2016  . DM2 (diabetes mellitus, type 2) (HCC) 02/23/2016  . Syncope and collapse 02/23/2016  . Polypharmacy 02/23/2016  . Osteomyelitis of toe (HCC) 01/23/2016  . Rhabdomyolysis 12/07/2015  . ARF (acute renal failure) (HCC) 11/29/2015  . Hypotension 11/29/2015  . 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 Angina effort (HCC) 11/07/2015  . Osteomyelitis (HCC) 11/07/2015    Past Surgical History:  Procedure Laterality Date  . AMPUTATION TOE Left 11/10/2015    Procedure: AMPUTATION TOE;  Surgeon: Linus Galas, MD;  Location: [facility] ORS;  Service: Podiatry;  Laterality: Left;  . AMPUTATION TOE Left 01/24/2016   Procedure: AMPUTATION TOE (2nd mpj);  Surgeon: Linus Galas, DPM;  Location: [facility] ORS;  Service: Podiatry;  Laterality: Left;  . CARDIAC CATHETERIZATION N/A 11/12/2015   Procedure: Left Heart Cath and Coronary Angiography;  Surgeon: Marcina Millard, MD;  Location: [facility] INVASIVE CV LAB;  Service: Cardiovascular;  Laterality: N/A;  . TOE AMPUTATION      Current Outpatient Rx  . Order #: [id number] Class: Historical Med  . Order #: [id number] Class: Historical Med  . Order #: [id number] Class: Historical Med  . Order #: [id number] Class: Historical Med  . Order #: [id number] Class: Historical Med  . Order #: [id number] Class: Historical Med  . Order #: [id number] Class: Print  . Order #: [id number] Class: Print  . Order #: [id number] Class: Historical Med  . Order #: [id number] Class: Historical Med  . Order #: [id number] Class: Historical Med  . Order #: [id number] Class: Historical Med  . Order #: [id number] Class: Historical Med  . Order #: [id number] Class: Historical Med  . Order #: [id number] Class: Historical Med  . Order #: [id number] Class: Print  . Order #: [id number] Class: Historical Med  . Order #: [id number] Class: Print  . Order #: [id number] Class: Normal  . Order #: [id number] Class: Historical Med  . Order #: [id number] Class: Historical Med  . Order #: [id number] Class: Historical Med  . Order #: [id number] Class: Historical Med  . Order #: [id number] Class: No Print  . Order #: [id number] Class: Historical Med  . Order #:  [id number] Class: Historical Med  . Order #: [id number] Class: Historical Med  . Order #: [id number] Class: Historical Med  . Order #: [id number] Class: Historical Med  . Order #: [id number] Class: Historical Med    Allergies Codeine; Influenza vaccines; Methadone; Percocet [oxycodone-acetaminophen]; Tetanus toxoids; and Tetracyclines &  related  Family History  Problem Relation Age of Onset  . CAD Mother   . CAD Father     Social History Social History  Substance Use Topics  . Smoking status: Never Smoker  . Smokeless tobacco: Never Used  . Alcohol use No    Review of Systems Unable to obtain due to patient mental status. ____________________________________________   PHYSICAL EXAM:  VITAL SIGNS: ED Triage Vitals  Enc Vitals Group     BP 02/23/17 1056 (!) 83/46     Pulse Rate 02/23/17 1056 65     Resp 02/23/17 1056 (!) 8     Temp --      Temp src --      SpO2 02/23/17 1056 90 %     Weight 02/23/17 1057 180 lb (81.6 kg)     Height --      Head Circumference --      Peak Flow --      Pain Score --      Pain Loc --      Pain Edu? --      Excl. in GC? --     Constitutional: Patient is somnolent but opens her eyes to voice. She mumbles answers to questions, and can follow basic commands. Eyes: Conjunctivae are normal.  EOMI. pupils are 3 mm and symmetric, reactive bilaterally. No scleral icterus. Head: Atraumatic. No raccoon eyes or Battle sign. Nose: No congestion/rhinnorhea. No swelling over the nose, or septal hematoma. Mouth/Throat: Mucous membranes are dry. No obvious dental injuries or malocclusion. Neck: No stridor.  Supple.  The patient reports diffuse C-spine tenderness to palpation, so collar is placed immediately. Cardiovascular: Normal rate, regular rhythm. No murmurs, rubs or gallops.  Respiratory: The patient has a respiratory rate between 8 and 10, with O2 sats in the low 90s on my examination. She is clear to auscultation without any wheezes rales or rhonchi bilaterally.  Gastrointestinal: Obese, Soft, nontender and nondistended.  No guarding or rebound.  No peritoneal signs. Musculoskeletal: No LE edema. Neurologic:  Somnolent and arouses to voice. Pt is alert to self only. Mumbles.  Face is symmetric.  EOMI. pupils are 2 mm and equally reactive bilaterally. Moves all extremities  well. Skin:  Skin is warm, dry and intact. Patient is multiple bruises of different ages; she does have a prominent 12" x 10" bruise over the left clavicle and overlying the left shoulder, multiple bruises on the upper extremities.   ____________________________________________   LABS (all labs ordered are listed, but only abnormal results are displayed)  Labs Reviewed  CBC - Abnormal; Notable for the following:       Result Value   RBC 3.37 (*)    Hemoglobin 11.4 (*)    HCT 34.6 (*)    MCV 102.6 (*)    RDW 15.0 (*)    All other components within normal limits  COMPREHENSIVE METABOLIC PANEL - Abnormal; Notable for the following:    Chloride 100 (*)    Glucose, Bld 154 (*)    BUN 29 (*)    Creatinine, Ser 1.12 (*)    Calcium 8.7 (*)    Total Protein 5.9 (*)    AST 57 (*)    GFR  calc non Af Amer 49 (*)    GFR calc Af Amer 56 (*)    All other components within normal limits  BLOOD GAS, VENOUS - Abnormal; Notable for the following:    pCO2, Ven 77 (*)    Bicarbonate 36.2 (*)    Acid-Base Excess 7.2 (*)    All other components within normal limits  CK - Abnormal; Notable for the following:    Total CK 2,037 (*)    All other components within normal limits  CULTURE, BLOOD (ROUTINE X 2)  CULTURE, BLOOD (ROUTINE X 2)  AMMONIA  ETHANOL  TROPONIN I  LACTIC ACID, PLASMA  URINALYSIS, COMPLETE (UACMP) WITH MICROSCOPIC  URINE DRUG SCREEN, QUALITATIVE ([facility] ONLY)  LACTIC ACID, PLASMA   ____________________________________________  EKG  ED ECG REPORT I, Rockne Menghini, the attending physician, personally viewed and interpreted this ECG.   Date: 02/23/2017  EKG Time: 1051  Rate: 66  Rhythm: normal sinus rhythm  Axis: normal  Intervals:none  ST&T Change: No STEMI  ____________________________________________  RADIOLOGY  Dg Chest 2 View  Result Date: 02/23/2017 CLINICAL DATA:  Fall.  Hypotensive. EXAM: CHEST  2 VIEW COMPARISON:  01/17/2017 FINDINGS: Prior median  sternotomy. Midline trachea. Cardiomegaly accentuated by AP portable technique. Possible small left pleural effusion. No pneumothorax. Extremely low lung volumes with resultant pulmonary interstitial prominence. Left worse than right base airspace disease is felt to be similar, given decreased inspiratory effort currently. IMPRESSION: No acute or posttraumatic deformity identified. Cardiomegaly with low lung volumes. Bibasilar airspace opacities. Felt to be similar and favored to represent areas of scarring or atelectasis. Left lower lobe pneumonia or aspiration cannot be entirely excluded. Electronically Signed   By: Jeronimo Greaves M.D.   On: 02/23/2017 12:08   Ct Head Wo Contrast  Result Date: 02/23/2017 CLINICAL DATA:  Patient fell last night and was on the floor all night. Multiple recent falls. EXAM: CT HEAD WITHOUT CONTRAST CT CERVICAL SPINE WITHOUT CONTRAST TECHNIQUE: Multidetector CT imaging of the head and cervical spine was performed following the standard protocol without intravenous contrast. Multiplanar CT image reconstructions of the cervical spine were also generated. COMPARISON:  Head CT 02/11/2017. Head CT and cervical spine CT 01/21/2017 FINDINGS: CT HEAD FINDINGS Brain: There is no evidence for acute hemorrhage, hydrocephalus, mass lesion, or abnormal extra-axial fluid collection. No definite CT evidence for acute infarction. Vascular: Atherosclerotic calcification is visualized in the carotid arteries. No dense MCA sign. Major dural sinuses are unremarkable. Skull: No evidence for fracture. No worrisome lytic or sclerotic lesion. Sinuses/Orbits: The visualized paranasal sinuses and mastoid air cells are clear. Visualized portions of the globes and intraorbital fat are unremarkable. Other: Focal scalp contusion identified high left frontal region. CT CERVICAL SPINE FINDINGS Alignment: Intact from the skullbase to the T1 vertebral body. Skull base and vertebrae: No evidence for an acute fracture.  No worrisome lytic or sclerotic osseous abnormality. Soft tissues and spinal canal: Unremarkable. Disc levels: Loss of disc height with endplate degeneration at C5-6 and C6-7. Upper facet osteoarthritis noted bilaterally. Upper chest: Patchy airspace disease with biapical pleural-parenchymal scarring noted within the visualized upper lungs. Other: None. IMPRESSION: 1. No acute intracranial abnormality. 2. No evidence for cervical spine fracture. Electronically Signed   By: Kennith Center M.D.   On: 02/23/2017 11:53   Ct Cervical Spine Wo Contrast  Result Date: 02/23/2017 CLINICAL DATA:  Fall.  Eleven falls in the last 2 weeks. EXAM: CT HEAD WITHOUT CONTRAST CT CERVICAL SPINE WITHOUT CONTRAST TECHNIQUE: Multidetector CT  imaging of the head and cervical spine was performed following the standard protocol without intravenous contrast. Multiplanar CT image reconstructions of the cervical spine were also generated. COMPARISON:  02/11/2017 FINDINGS: CT HEAD FINDINGS Brain: Prominence of the sulci and ventricles identified compatible with brain atrophy. Low attenuation within the subcortical and periventricular white matter is identified compatible with chronic microvascular disease. Vascular: No hyperdense vessel or unexpected calcification. Skull: Normal. Negative for fracture or focal lesion. Sinuses/Orbits: The paranasal sinuses and mastoid air cells are clear. Other: None. CT CERVICAL SPINE FINDINGS Alignment: Normal. Skull base and vertebrae: No acute fracture. No primary bone lesion or focal pathologic process. Soft tissues and spinal canal: No prevertebral fluid or swelling. No visible canal hematoma. Disc levels: Mild multi level disc space narrowing and ventral endplate spurring noted. Most advanced at C5-6 and C6-7 peer Upper chest: Ground-Hayes attenuation and interstitial thickening within both lung apices identified. Other: None IMPRESSION: 1. No acute intracranial abnormalities. Chronic small vessel  ischemic change and brain atrophy noted. 2. No evidence for cervical spine fracture. Mild multi level degenerative disc disease noted. 3. Interstitial changes within the lung apices suggestive of atypical infection versus diffuse pulmonary edema. 4. Electronically Signed   By: Signa Kellaylor  Stroud M.D.   On: 02/23/2017 12:06   Dg Shoulder Left  Result Date: 02/23/2017 CLINICAL DATA:  Recent fall with left shoulder pain, initial encounter EXAM: LEFT SHOULDER - 2+ VIEW COMPARISON:  None. FINDINGS: Degenerative changes of the acromioclavicular joint are seen. No acute fracture or dislocation is seen. Glenohumeral did degenerative changes are noted as well. Underlying left rib fractures are seen. No acute bony or soft tissue abnormality is noted. IMPRESSION: Degenerative change without acute abnormality. Electronically Signed   By: Alcide CleverMark  Lukens M.D.   On: 02/23/2017 12:08    ____________________________________________   PROCEDURES  Procedure(s) performed: None  Procedures  Critical Care performed: Yes, see critical care note(s) ____________________________________________   INITIAL IMPRESSION / ASSESSMENT AND PLAN / ED COURSE  Pertinent labs & imaging results that were available during my care of the patient were reviewed by me and considered in my medical decision making (see chart for details).  71 y.o. female with a history of recurrent falls presenting with altered mental status, cervical spine pain, multiple bruises including over the left shoulder. We will do a syncope workup, altered mental status workup, and trauma workup for this patient. She will require admission to the hospital.  ----------------------------------------- 1:07 PM on 02/23/2017 -----------------------------------------  The patient had significant improvement in her mental status after Narcan. Her blood pressure has also steadily been increasing and is now in the 120s after 2 L of normal saline. She is afebrile here,  and there is no history of cough, but her chest x-ray shows what is likely atelectasis, but cannot exclude pneumonia. Clinically, pneumonia is less likely but given that she has had hypoxia, and hypotension, I will give her first dose of antibiotics and have the admitting physicians follow her clinical course for continuation or discontinuation of therapy.  The patient's CT head, chest x-ray, and left shoulder x-rays do not show any acute traumatic injuries.  At this time, the entirety of the patient's clinical history of still unclear, but it is possible that she had an opioid overdose, which resulted in sustaining a fall, and now she has rhabdomyolysis with an elevated CK and creatinine. Her elevated creatinine and hypotension may also be related to dehydration from laying on the ground. We will plan to admit her  to the hospital for further evaluation and treatment.  CRITICAL CARE Performed by: Rockne Menghini   Total critical care time: 45 minutes  Critical care time was exclusive of separately billable procedures and treating other patients.  Critical care was necessary to treat or prevent imminent or life-threatening deterioration.  Critical care was time spent personally by me on the following activities: development of treatment plan with patient and/or surrogate as well as nursing, discussions with consultants, evaluation of patient's response to treatment, examination of patient, obtaining history from patient or surrogate, ordering and performing treatments and interventions, ordering and review of laboratory studies, ordering and review of radiographic studies, pulse oximetry and re-evaluation of patient's condition.    ____________________________________________  FINAL CLINICAL IMPRESSION(S) / ED DIAGNOSES  Final diagnoses:  Renal insufficiency  Non-traumatic rhabdomyolysis  Hypercarbia  Opioid overdose, accidental or unintentional, initial encounter  Hypotension,  unspecified hypotension type         NEW MEDICATIONS STARTED DURING THIS VISIT:  New Prescriptions   No medications on file      Rockne Menghini, MD 02/23/17 1319    Rockne Menghini, MD 03/14/17 2029

## 2017-02-23 NOTE — ED Notes (Signed)
Pt is more verbal after narcan - responds to questions and opens eyes during conversation - she is c/o feeling cold and fuzzy headed - pt is argumentative with staff at this point

## 2017-02-23 NOTE — Clinical Social Work Note (Signed)
Clinical Social Work Assessment  Patient Details  Name: Jessica Donovan MRN: [id number] Date of Birth: April 13, 1946  Date of referral:  02/23/17               Reason for consult:  Facility Placement, Abuse/Neglect, [employer] Resources, Discharge Planning                Permission sought to share information with:  Case Manager, Other (APS) Permission granted to share information::     Name::        Agency::  APS Jessica Donovan:  [phone]  Relationship::     Contact Information:     Housing/Transportation Living arrangements for the past 2 months:  Apartment Systems analyst(Senior Living Apartment) Source of Information:  Other (Comment Required), Medical Team, Case Manager (APS) Patient Interpreter Needed:  None Criminal Activity/Legal Involvement Pertinent to Current Situation/Hospitalization:  No - Comment as needed Significant Relationships:  Merchandiser, retailCommunity Support Lives with:  Self Do you feel safe going back to the place where you live?  No Need for family participation in patient care:  No (Coment)  Care giving concerns:  LCSW received call from APS worker Jessica Heckandy regarding APS involvement and patient in ED.  Reports report came only a couple days ago and that patient is needing placement. Jessica Donovan reports she has been trying to work on placement, but cannot get an FL2 completed as patient has no PCP as she has been discharged from [facility] Donovan services.  Reports patient lives in a Senior Living Independent Living Apartment complex where she continues to have multiple falls, mismanages her medications, and unable to safely care for herself. Jessica Donovan reports she does not know about any of her family, but there is a housing assistant : Jessica Donovan who knows the patient well and has been helping her.  APS does not have guardianship of patient and are not currently pursusing guardianship, however are wanting placement for patient and report she is agreeable.  Patient currently is confused and combative, unable to  participate and being admitted to inpatient medical unit.   Social Worker assessment / plan:  LCSW completed consult for APS involvement and potential placement.  Will need PT consult and medical work up on patient. Will discuss plans with patient, but per APS, patient is unsafe to return home and [place].  LCSW will follow up with patient and assist with disposition along with alerting CSW on unit about patient's status.    Employment status:  Retired Database administratornsurance information:  Managed Medicare PT Recommendations:  Not assessed at this time Information / Referral to community resources:  APS ([place] Required: [place], Name & Number of worker spoken with), Skilled Nursing Facility  Patient/Family's Response to care:  APS agreeable with amdission  Patient/Family's Understanding of and Emotional Response to Diagnosis, Current Treatment, and Prognosis:  APS understanding of reason for admission and current medical work up.  Emotional Assessment Appearance:  Disheveled Attitude/Demeanor/Rapport:  Aggressive (Verbally and/or physically), Guarded, Other (confused) Affect (typically observed):  Agitated, Anxious Orientation:  Oriented to Self Alcohol / Substance use:  Other (opiod use, looks to be perscribed) Psych involvement (Current and /or in the community):  No (Comment)  Discharge Needs  Concerns to be addressed:  Compliance Issues Concerns, Care Coordination, Medication Concerns Readmission within the last 30 days:  Yes Current discharge risk:  Lives alone Barriers to Discharge:  Continued Medical Work up, [employer] Partnersnsurance Authorization   Jessica Donovan, Jessica Donovan, [place] 02/23/2017,  3:05 PM

## 2017-02-23 NOTE — Progress Notes (Signed)
APS involved:  [name] [phone].   APS reporting unsafe return to [facility] Senior Living, requesting FL2 and PT consult for SNF placement.  Will continue to follow and assist with disposition.  Deretha EmoryHannah Ande Therrell LCSW, MSW Clinical Social Work: Optician, dispensingystem Wide Float Coverage for :  [phone]

## 2017-02-23 NOTE — ED Triage Notes (Signed)
Pt fell last night and laid in floor all night. Had emergency button on neck but says she did not want to push. Fell 11 times per ems in last 2 weeks. Lives alone. Has DNR. Blood sugar for ems 125. Hypotensive. Drowsy, pale, bradypnea.

## 2017-02-23 NOTE — ED Notes (Signed)
In and out cath completed without difficulty - pt tolerated well - Nellie RN present

## 2017-02-23 NOTE — H&P (Signed)
[facility] - [place] at [facility]   PATIENT NAME: Jessica Donovan    MR#:  [id number]  DATE OF BIRTH:  05/01/1946  DATE OF ADMISSION:  02/23/2017  PRIMARY CARE PHYSICIAN: Lauro Regulus., MD   REQUESTING/REFERRING PHYSICIAN: Placido Sou  CHIEF COMPLAINT:   Chief Complaint  Patient presents with  . Fall    HISTORY OF PRESENT ILLNESS: Jessica Donovan  is a 71 y.o. female with a known history of Ch back pain, CAD, Htn, OM, Stroke, - fell on floor and could not get up, so called EMS. Not a very clear historian. As per ER physician, she called EMS her self. She is lethargic and minimal responsive, was hypotensive on arrival, EMS and ER gave narcaine- and had response to that- denies overdose of her pain meds.  CK level is high. She is obtunded, not responding, but currently vitals stable after IV fluids, so I am admitting to medical service.  PAST MEDICAL HISTORY:   Past Medical History:  Diagnosis Date  . Chronic back pain   . Collagen vascular disease (HCC)   . Coronary artery disease   . Diabetes mellitus without complication (HCC)   . Hypertension   . Low back pain   . Myocardial infarction   . Osteomyelitis of toe (HCC) 01/23/2016  . Stroke [facility])     PAST SURGICAL HISTORY: Past Surgical History:  Procedure Laterality Date  . AMPUTATION TOE Left 11/10/2015   Procedure: AMPUTATION TOE;  Surgeon: Linus Galas, MD;  Location: [facility] ORS;  Service: Podiatry;  Laterality: Left;  . AMPUTATION TOE Left 01/24/2016   Procedure: AMPUTATION TOE (2nd mpj);  Surgeon: Linus Galas, DPM;  Location: [facility] ORS;  Service: Podiatry;  Laterality: Left;  . CARDIAC CATHETERIZATION N/A 11/12/2015   Procedure: Left Heart Cath and Coronary Angiography;  Surgeon: Marcina Millard, MD;  Location: [facility] INVASIVE CV LAB;  Service: Cardiovascular;  Laterality: N/A;  . TOE AMPUTATION      SOCIAL HISTORY:  Social History  Substance Use Topics  . Smoking status: Never Smoker  . Smokeless  tobacco: Never Used  . Alcohol use No    FAMILY HISTORY:  Family History  Problem Relation Age of Onset  . CAD Mother   . CAD Father     DRUG ALLERGIES:  Allergies  Allergen Reactions  . Codeine Nausea And Vomiting  . Influenza Vaccines Other (See Comments)    Reaction:  Caused pt to pass out   . Methadone Hives and Itching  . Percocet [Oxycodone-Acetaminophen] Nausea And Vomiting  . Tetanus Toxoids Swelling and Other (See Comments)    Reaction:  Swelling at injection site  . Tetracyclines & Related Rash    REVIEW OF SYSTEMS:   PT is lethargic, not able to give any details.  MEDICATIONS AT HOME:  Prior to Admission medications   Medication Sig Start Date End Date Taking? Authorizing Provider  acetaminophen (TYLENOL) 500 MG tablet Take 1,000 mg by mouth every 4 (four) hours as needed for mild pain or headache.    Yes Historical Provider, MD  ALPRAZolam Prudy Feeler) 1 MG tablet Take 1 mg by mouth daily as needed.  02/08/17  Yes Historical Provider, MD  atorvastatin (LIPITOR) 20 MG tablet Take 20 mg by mouth daily.   Yes Historical Provider, MD  celecoxib (CELEBREX) 200 MG capsule Take 1 capsule by mouth daily. 01/28/17  Yes Historical Provider, MD  cyclobenzaprine (FLEXERIL) 10 MG tablet Take 1 tablet (10 mg total) by mouth 3 (three) times daily as  needed for muscle spasms. 01/27/16  Yes Enid Baas, MD  ferrous sulfate 325 (65 FE) MG tablet Take 325 mg by mouth daily with breakfast.   Yes Historical Provider, MD  isosorbide mononitrate (IMDUR) 30 MG 24 hr tablet Take 30 mg by mouth daily.    Yes Historical Provider, MD  lisinopril (PRINIVIL,ZESTRIL) 20 MG tablet Take 20 mg by mouth daily.   Yes Historical Provider, MD  metoprolol tartrate (LOPRESSOR) 25 MG tablet Take 25 mg by mouth 2 (two) times daily. 01/28/17  Yes Historical Provider, MD  mirtazapine (REMERON) 15 MG tablet Take 15 mg by mouth at bedtime.   Yes Historical Provider, MD  morphine (MSIR) 15 MG tablet Take 1 tablet  (15 mg total) by mouth every 6 (six) hours as needed for severe pain. 01/19/17  Yes Srikar Sudini, MD  Morphine Sulfate ER 30 MG TBEA Take 30 mg by mouth 2 (two) times daily. 01/19/17  Yes Srikar Sudini, MD  nitrofurantoin, macrocrystal-monohydrate, (MACROBID) 100 MG capsule Take 100 mg by mouth daily.   Yes Historical Provider, MD  omeprazole (PRILOSEC) 20 MG capsule Take 20 mg by mouth daily.   Yes Historical Provider, MD  traZODone (DESYREL) 100 MG tablet Take 100 mg by mouth at bedtime. 01/28/17  Yes Historical Provider, MD  venlafaxine XR (EFFEXOR-XR) 150 MG 24 hr capsule Take 150 mg by mouth daily with breakfast.   Yes Historical Provider, MD  Vitamin D, Ergocalciferol, (DRISDOL) 50000 units CAPS capsule Take 50,000 Units by mouth every 7 (seven) days. Pt takes on Sunday.   Yes Historical Provider, MD  aspirin EC 81 MG tablet Take 81 mg by mouth daily.    Historical Provider, MD  Biotin 5 MG CAPS Take 5 mg by mouth daily at 12 noon.     Historical Provider, MD  ciprofloxacin (CIPRO) 500 MG tablet Take 1 tablet (500 mg total) by mouth 2 (two) times daily. Patient not taking: Reported on 02/11/2017 01/19/17   Milagros Loll, MD  furosemide (LASIX) 20 MG tablet Take 20 mg by mouth daily.    Historical Provider, MD  insulin aspart (NOVOLOG) 100 UNIT/ML injection Inject 3-15 Units into the skin 3 (three) times daily with meals as needed for high blood sugar. Pt uses as needed per sliding scale:    Less than 140:  0 units  140-180:  3 units 181-220:  4 units 221- 260:  6 units 261- 320:  8 units 321-360:  10 units 361-400:  12 units Greater than 400:  15 units    Historical Provider, MD  Insulin Glargine (TOUJEO SOLOSTAR) 300 UNIT/ML SOPN Inject 60 Units into the skin daily.     Historical Provider, MD  metroNIDAZOLE (FLAGYL) 500 MG tablet Take 1 tablet (500 mg total) by mouth every 8 (eight) hours. Patient not taking: Reported on 02/11/2017 01/19/17   Milagros Loll, MD  Multiple Vitamin  (MULTIVITAMIN WITH MINERALS) TABS tablet Take 1 tablet by mouth daily.    Historical Provider, MD  nitroGLYCERIN (NITROSTAT) 0.4 MG SL tablet Place 0.4 mg under the tongue every 5 (five) minutes as needed for chest pain. Reported on 04/16/2016    Historical Provider, MD  pregabalin (LYRICA) 150 MG capsule Take 1 capsule (150 mg total) by mouth 2 (two) times daily. 01/19/17   Milagros Loll, MD  ticagrelor (BRILINTA) 90 MG TABS tablet Take 90 mg by mouth 2 (two) times daily.    Historical Provider, MD  tolterodine (DETROL LA) 4 MG 24 hr capsule Take 4  mg by mouth daily.    Historical Provider, MD  vitamin B-12 (CYANOCOBALAMIN) 1000 MCG tablet Take 1,000 mcg by mouth daily.    Historical Provider, MD      PHYSICAL EXAMINATION:   VITAL SIGNS: Blood pressure (!) 143/73, pulse 81, temperature 97.7 F (36.5 C), temperature source Oral, resp. rate 20, weight 81.6 kg (180 lb), SpO2 97 %.  GENERAL:  71 y.o.-year-old patient lying in the bed with no acute distress.  EYES: Pupils equal, round, reactive to light  No scleral icterus.   HEENT: some signs of echymosis around eyes, normocephalic. Oropharynx and nasopharynx clear.  NECK:  Supple, no jugular venous distention. No thyroid enlargement, no tenderness.  LUNGS: Normal breath sounds bilaterally, no wheezing, rales,rhonchi or crepitation. No use of accessory muscles of respiration.  CARDIOVASCULAR: S1, S2 normal. No murmurs, rubs, or gallops.  ABDOMEN: Soft, nontender, nondistended. Bowel sounds present. No organomegaly or mass.  EXTREMITIES: No pedal edema, cyanosis, or clubbing.  NEUROLOGIC: pt is completely obtunded, no response to painful stimuli, but awake after narcane injection. PSYCHIATRIC: The patient is lethargic.  SKIN: No obvious rash, lesion, or ulcer.   LABORATORY PANEL:   CBC  Recent Labs Lab 02/23/17 1059  WBC 7.1  HGB 11.4*  HCT 34.6*  PLT 241  MCV 102.6*  MCH 33.8  MCHC 33.0  RDW 15.0*    ------------------------------------------------------------------------------------------------------------------  Chemistries   Recent Labs Lab 02/23/17 1059  NA 136  K 4.8  CL 100*  CO2 28  GLUCOSE 154*  BUN 29*  CREATININE 1.12*  CALCIUM 8.7*  AST 57*  ALT 15  ALKPHOS 51  BILITOT 0.4   ------------------------------------------------------------------------------------------------------------------ estimated creatinine clearance is 51.4 mL/min (by C-G formula based on SCr of 1.12 mg/dL (H)). ------------------------------------------------------------------------------------------------------------------ No results for input(s): TSH, T4TOTAL, T3FREE, THYROIDAB in the last 72 hours.  Invalid input(s): FREET3   Coagulation profile No results for input(s): INR, PROTIME in the last 168 hours. ------------------------------------------------------------------------------------------------------------------- No results for input(s): DDIMER in the last 72 hours. -------------------------------------------------------------------------------------------------------------------  Cardiac Enzymes  Recent Labs Lab 02/23/17 1059  TROPONINI <0.03   ------------------------------------------------------------------------------------------------------------------ Invalid input(s): POCBNP  ---------------------------------------------------------------------------------------------------------------  Urinalysis    Component Value Date/Time   COLORURINE YELLOW (A) 02/23/2017 1310   APPEARANCEUR CLEAR (A) 02/23/2017 1310   APPEARANCEUR Hazy 12/15/2014 0958   LABSPEC 1.017 02/23/2017 1310   LABSPEC 1.014 12/15/2014 0958   PHURINE 5.0 02/23/2017 1310   GLUCOSEU NEGATIVE 02/23/2017 1310   GLUCOSEU Negative 12/15/2014 0958   HGBUR SMALL (A) 02/23/2017 1310   BILIRUBINUR NEGATIVE 02/23/2017 1310   BILIRUBINUR Negative 12/15/2014 0958   KETONESUR NEGATIVE 02/23/2017 1310    PROTEINUR NEGATIVE 02/23/2017 1310   NITRITE NEGATIVE 02/23/2017 1310   LEUKOCYTESUR NEGATIVE 02/23/2017 1310   LEUKOCYTESUR 1+ 12/15/2014 0958     RADIOLOGY: Dg Chest 2 View  Result Date: 02/23/2017 CLINICAL DATA:  Fall.  Hypotensive. EXAM: CHEST  2 VIEW COMPARISON:  01/17/2017 FINDINGS: Prior median sternotomy. Midline trachea. Cardiomegaly accentuated by AP portable technique. Possible small left pleural effusion. No pneumothorax. Extremely low lung volumes with resultant pulmonary interstitial prominence. Left worse than right base airspace disease is felt to be similar, given decreased inspiratory effort currently. IMPRESSION: No acute or posttraumatic deformity identified. Cardiomegaly with low lung volumes. Bibasilar airspace opacities. Felt to be similar and favored to represent areas of scarring or atelectasis. Left lower lobe pneumonia or aspiration cannot be entirely excluded. Electronically Signed   By: Jeronimo Greaves M.D.   On: 02/23/2017 12:08   Ct  Head Wo Contrast  Result Date: 02/23/2017 CLINICAL DATA:  Patient fell last night and was on the floor all night. Multiple recent falls. EXAM: CT HEAD WITHOUT CONTRAST CT CERVICAL SPINE WITHOUT CONTRAST TECHNIQUE: Multidetector CT imaging of the head and cervical spine was performed following the standard protocol without intravenous contrast. Multiplanar CT image reconstructions of the cervical spine were also generated. COMPARISON:  Head CT 02/11/2017. Head CT and cervical spine CT 01/21/2017 FINDINGS: CT HEAD FINDINGS Brain: There is no evidence for acute hemorrhage, hydrocephalus, mass lesion, or abnormal extra-axial fluid collection. No definite CT evidence for acute infarction. Vascular: Atherosclerotic calcification is visualized in the carotid arteries. No dense MCA sign. Major dural sinuses are unremarkable. Skull: No evidence for fracture. No worrisome lytic or sclerotic lesion. Sinuses/Orbits: The visualized paranasal sinuses and mastoid  air cells are clear. Visualized portions of the globes and intraorbital fat are unremarkable. Other: Focal scalp contusion identified high left frontal region. CT CERVICAL SPINE FINDINGS Alignment: Intact from the skullbase to the T1 vertebral body. Skull base and vertebrae: No evidence for an acute fracture. No worrisome lytic or sclerotic osseous abnormality. Soft tissues and spinal canal: Unremarkable. Disc levels: Loss of disc height with endplate degeneration at C5-6 and C6-7. Upper facet osteoarthritis noted bilaterally. Upper chest: Patchy airspace disease with biapical pleural-parenchymal scarring noted within the visualized upper lungs. Other: None. IMPRESSION: 1. No acute intracranial abnormality. 2. No evidence for cervical spine fracture. Electronically Signed   By: Kennith Center M.D.   On: 02/23/2017 11:53   Ct Cervical Spine Wo Contrast  Result Date: 02/23/2017 CLINICAL DATA:  Fall.  Eleven falls in the last 2 weeks. EXAM: CT HEAD WITHOUT CONTRAST CT CERVICAL SPINE WITHOUT CONTRAST TECHNIQUE: Multidetector CT imaging of the head and cervical spine was performed following the standard protocol without intravenous contrast. Multiplanar CT image reconstructions of the cervical spine were also generated. COMPARISON:  02/11/2017 FINDINGS: CT HEAD FINDINGS Brain: Prominence of the sulci and ventricles identified compatible with brain atrophy. Low attenuation within the subcortical and periventricular white matter is identified compatible with chronic microvascular disease. Vascular: No hyperdense vessel or unexpected calcification. Skull: Normal. Negative for fracture or focal lesion. Sinuses/Orbits: The paranasal sinuses and mastoid air cells are clear. Other: None. CT CERVICAL SPINE FINDINGS Alignment: Normal. Skull base and vertebrae: No acute fracture. No primary bone lesion or focal pathologic process. Soft tissues and spinal canal: No prevertebral fluid or swelling. No visible canal hematoma. Disc  levels: Mild multi level disc space narrowing and ventral endplate spurring noted. Most advanced at C5-6 and C6-7 peer Upper chest: Ground-Milligan attenuation and interstitial thickening within both lung apices identified. Other: None IMPRESSION: 1. No acute intracranial abnormalities. Chronic small vessel ischemic change and brain atrophy noted. 2. No evidence for cervical spine fracture. Mild multi level degenerative disc disease noted. 3. Interstitial changes within the lung apices suggestive of atypical infection versus diffuse pulmonary edema. 4. Electronically Signed   By: Signa Kell M.D.   On: 02/23/2017 12:06   Dg Shoulder Left  Result Date: 02/23/2017 CLINICAL DATA:  Recent fall with left shoulder pain, initial encounter EXAM: LEFT SHOULDER - 2+ VIEW COMPARISON:  None. FINDINGS: Degenerative changes of the acromioclavicular joint are seen. No acute fracture or dislocation is seen. Glenohumeral did degenerative changes are noted as well. Underlying left rib fractures are seen. No acute bony or soft tissue abnormality is noted. IMPRESSION: Degenerative change without acute abnormality. Electronically Signed   By: Eulah Pont.D.  On: 02/23/2017 12:08    EKG: Orders placed or performed during the hospital encounter of 02/23/17  . EKG 12-Lead  . EKG 12-Lead    IMPRESSION AND PLAN:  * Altered mental status   Opioid overdose   Fall   Rhabdomyolysis    Will give IV fluids, Keep NPO, Until completely awake.    Monitor on tele.   No signs of infection or injuries on CT head and spine yet.   Be watchful for aspiration pneumonia.   Psych eval.  * Htn   Hold all meds for now, re-strart once pt is alert and BP stable.  * Depression   Hold oral meds now, psych consult.  * Chronic pain.   High dose of morphin at home.    We may need to decrease dose on d/c.   All the records are reviewed and case discussed with ED provider. Management plans discussed with the patient, family and  they are in agreement.  CODE STATUS: DNR Code Status History    Date Active Date Inactive Code Status Order ID Comments User Context   01/17/2017  4:07 AM 01/19/2017  8:10 PM DNR [id number]  Arnaldo NatalMichael S Diamond, MD Inpatient   04/09/2016  3:11 AM 04/10/2016  8:44 PM DNR [id number]  Arnaldo NatalMichael S Diamond, MD Inpatient   04/09/2016  2:05 AM 04/09/2016  3:11 AM Full Code [id number]  Oralia Manisavid Willis, MD Inpatient   04/03/2016  6:53 PM 04/04/2016  6:17 PM Full Code [id number]  Houston SirenVivek J Sainani, MD Inpatient   02/23/2016 10:52 PM 02/25/2016  4:37 PM DNR [id number]  Robley FriesAbdullahi Oseni, MD Inpatient   01/24/2016  3:54 PM 01/27/2016  7:06 PM DNR [id number]  Milagros LollSrikar Sudini, MD Inpatient   01/23/2016  4:07 PM 01/23/2016  4:22 PM Full Code [id number]  Shaune PollackQing Chen, MD Inpatient   12/07/2015 10:23 PM 12/09/2015  6:58 PM Full Code [id number]  Auburn BilberryShreyang Patel, MD Inpatient   11/29/2015  5:41 PM 12/01/2015  6:33 PM DNR [id number]  Shaune PollackQing Chen, MD Inpatient   11/10/2015  9:54 AM 11/12/2015  9:39 PM DNR [id number]  Linus Galasodd Cline, MD Inpatient   11/07/2015  5:32 PM 11/10/2015  9:54 AM DNR [id number]  Altamese DillingVaibhavkumar Kate Sweetman, MD Inpatient   11/07/2015  3:49 PM 11/07/2015  5:32 PM Full Code [id number]  Altamese DillingVaibhavkumar Wilkin Lippy, MD Inpatient    Questions for Most Recent Historical Code Status (Order [id number])    Question Answer Comment   In the event of cardiac or respiratory ARREST Do not call a "code blue"    In the event of cardiac or respiratory ARREST Do not perform Intubation, CPR, defibrillation or ACLS    In the event of cardiac or respiratory ARREST Use medication by any route, position, wound care, and other measures to relive pain and suffering. May use oxygen, suction and manual treatment of airway obstruction as needed for comfort.        TOTAL TIME TAKING CARE OF THIS PATIENT: 50 minutes.    Altamese DillingVACHHANI, Alya Smaltz M.D on 02/23/2017   Between 7am to 6pm - Pager - [phone]  After 6pm go to [email or web address] - password [email or web address]  [facility] [place]  [facility]  Office  [phone]  CC: Primary care physician; Lauro RegulusANDERSON,MARSHALL W., MD   Note: This dictation was prepared with Dragon dictation along with smaller phrase technology. Any transcriptional errors that result from this process are unintentional.

## 2017-02-23 NOTE — ED Notes (Signed)
Pt is asleep and not responding to verbal or physical stimuli - per admitting doc and Dr Red ChristiansNorman narcan given

## 2017-02-24 DIAGNOSIS — R404 Transient alteration of awareness: Secondary | ICD-10-CM

## 2017-02-24 DIAGNOSIS — T402X1A Poisoning by other opioids, accidental (unintentional), initial encounter: Principal | ICD-10-CM

## 2017-02-24 LAB — BASIC METABOLIC PANEL
Anion gap: 3 — ABNORMAL LOW (ref 5–15)
BUN: 16 mg/dL (ref 6–20)
CALCIUM: 8.2 mg/dL — AB (ref 8.9–10.3)
CO2: 30 mmol/L (ref 22–32)
CREATININE: 0.59 mg/dL (ref 0.44–1.00)
Chloride: 107 mmol/L (ref 101–111)
GFR calc non Af Amer: >60 mL/min (ref >60)
Glucose, Bld: 107 mg/dL — ABNORMAL HIGH (ref 65–99)
Potassium: 4.5 mmol/L (ref 3.5–5.1)
SODIUM: 140 mmol/L (ref 135–145)

## 2017-02-24 LAB — CK
CK TOTAL: 1343 U/L — AB (ref 38–234)
Total CK: 1999 U/L — ABNORMAL HIGH (ref 38–234)

## 2017-02-24 LAB — BLOOD GAS, VENOUS
Acid-Base Excess: 7.2 mmol/L — ABNORMAL HIGH (ref 0.0–2.0)
Bicarbonate: 36.2 mmol/L — ABNORMAL HIGH (ref 20.0–28.0)
Patient temperature: 37
pCO2, Ven: 77 mmHg (ref 44.0–60.0)
pH, Ven: 7.28 (ref 7.250–7.430)

## 2017-02-24 LAB — CBC
HCT: 33.2 % — ABNORMAL LOW (ref 35.0–47.0)
Hemoglobin: 11 g/dL — ABNORMAL LOW (ref 12.0–16.0)
MCH: 34.3 pg — ABNORMAL HIGH (ref 26.0–34.0)
MCHC: 33.3 g/dL (ref 32.0–36.0)
MCV: 103 fL — ABNORMAL HIGH (ref 80.0–100.0)
PLATELETS: 204 10*3/uL (ref 150–440)
RBC: 3.22 MIL/uL — AB (ref 3.80–5.20)
RDW: 14.4 % (ref 11.5–14.5)
WBC: 5 10*3/uL (ref 3.6–11.0)

## 2017-02-24 LAB — GLUCOSE, CAPILLARY
GLUCOSE-CAPILLARY: 122 mg/dL — AB (ref 65–99)
GLUCOSE-CAPILLARY: 173 mg/dL — AB (ref 65–99)
GLUCOSE-CAPILLARY: 147 mg/dL — AB (ref 65–99)
GLUCOSE-CAPILLARY: 155 mg/dL — AB (ref 65–99)

## 2017-02-24 MED ORDER — METOPROLOL TARTRATE 25 MG PO TABS
25.0000 mg | ORAL_TABLET | Freq: Two times a day (BID) | ORAL | Status: DC
  Administered 2017-02-24 – 2017-03-01 (×10): 25 mg via ORAL
  Filled 2017-02-24 (×10): qty 1

## 2017-02-24 MED ORDER — METOPROLOL TARTRATE 25 MG PO TABS: 25.0000 mg | ORAL_TABLET | Freq: Two times a day (BID) | ORAL | Status: DC

## 2017-02-24 MED ORDER — ENOXAPARIN SODIUM 40 MG/0.4ML ~~LOC~~ SOLN
40.0000 mg | SUBCUTANEOUS | Status: DC
  Administered 2017-02-24 – 2017-02-28 (×5): 40 mg via SUBCUTANEOUS
  Filled 2017-02-24 (×5): qty 0.4

## 2017-02-24 MED ORDER — ALPRAZOLAM 0.5 MG PO TABS
1.0000 mg | ORAL_TABLET | Freq: Three times a day (TID) | ORAL | Status: DC
  Administered 2017-02-24 – 2017-03-01 (×16): 1 mg via ORAL
  Filled 2017-02-24 (×16): qty 2

## 2017-02-24 MED ORDER — ASPIRIN EC 81 MG PO TBEC
81.0000 mg | DELAYED_RELEASE_TABLET | Freq: Every day | ORAL | Status: DC
  Administered 2017-02-24 – 2017-03-01 (×6): 81 mg via ORAL
  Filled 2017-02-24 (×6): qty 1

## 2017-02-24 MED ORDER — PREGABALIN 75 MG PO CAPS
150.0000 mg | ORAL_CAPSULE | Freq: Two times a day (BID) | ORAL | Status: DC
  Administered 2017-02-24 – 2017-03-01 (×11): 150 mg via ORAL
  Filled 2017-02-24 (×11): qty 2

## 2017-02-24 MED ORDER — FESOTERODINE FUMARATE ER 8 MG PO TB24
8.0000 mg | ORAL_TABLET | Freq: Every day | ORAL | Status: DC
  Administered 2017-02-24 – 2017-03-01 (×6): 8 mg via ORAL
  Filled 2017-02-24 (×6): qty 1

## 2017-02-24 MED ORDER — CELECOXIB 200 MG PO CAPS
200.0000 mg | ORAL_CAPSULE | Freq: Every day | ORAL | Status: DC
  Administered 2017-02-24 – 2017-03-01 (×6): 200 mg via ORAL
  Filled 2017-02-24 (×7): qty 1

## 2017-02-24 MED ORDER — PANTOPRAZOLE SODIUM 40 MG PO TBEC
40.0000 mg | DELAYED_RELEASE_TABLET | Freq: Every day | ORAL | Status: DC
  Administered 2017-02-24 – 2017-03-01 (×6): 40 mg via ORAL
  Filled 2017-02-24 (×6): qty 1

## 2017-02-24 MED ORDER — ADULT MULTIVITAMIN W/MINERALS CH
1.0000 | ORAL_TABLET | Freq: Every day | ORAL | Status: DC
  Administered 2017-02-24 – 2017-03-01 (×6): 1 via ORAL
  Filled 2017-02-24 (×6): qty 1

## 2017-02-24 MED ORDER — CYCLOBENZAPRINE HCL 10 MG PO TABS
10.0000 mg | ORAL_TABLET | Freq: Three times a day (TID) | ORAL | Status: DC | PRN
  Administered 2017-02-26 – 2017-03-01 (×2): 10 mg via ORAL
  Filled 2017-02-24 (×2): qty 1

## 2017-02-24 MED ORDER — INSULIN DETEMIR 100 UNIT/ML ~~LOC~~ SOLN
20.0000 [IU] | SUBCUTANEOUS | Status: DC
  Administered 2017-02-24 – 2017-02-28 (×5): 20 [IU] via SUBCUTANEOUS
  Filled 2017-02-24 (×6): qty 0.2

## 2017-02-24 MED ORDER — VENLAFAXINE HCL ER 75 MG PO CP24
150.0000 mg | ORAL_CAPSULE | Freq: Every day | ORAL | Status: DC
  Administered 2017-02-25 – 2017-03-01 (×5): 150 mg via ORAL
  Filled 2017-02-24 (×5): qty 2

## 2017-02-24 MED ORDER — VITAMIN D (ERGOCALCIFEROL) 1.25 MG (50000 UNIT) PO CAPS
50000.0000 [IU] | ORAL_CAPSULE | ORAL | Status: DC
  Administered 2017-02-28: 08:00:00 50000 [IU] via ORAL
  Filled 2017-02-24: qty 1

## 2017-02-24 MED ORDER — TRAZODONE HCL 100 MG PO TABS
100.0000 mg | ORAL_TABLET | Freq: Every day | ORAL | Status: DC
  Administered 2017-02-24 – 2017-02-28 (×5): 100 mg via ORAL
  Filled 2017-02-24 (×5): qty 1

## 2017-02-24 MED ORDER — FERROUS SULFATE 325 (65 FE) MG PO TABS
325.0000 mg | ORAL_TABLET | Freq: Every day | ORAL | Status: DC
  Administered 2017-02-25 – 2017-03-01 (×5): 325 mg via ORAL
  Filled 2017-02-24 (×5): qty 1

## 2017-02-24 MED ORDER — LISINOPRIL 10 MG PO TABS
10.0000 mg | ORAL_TABLET | Freq: Every day | ORAL | Status: DC
  Administered 2017-02-24 – 2017-03-01 (×6): 10 mg via ORAL
  Filled 2017-02-24 (×6): qty 1

## 2017-02-24 MED ORDER — ISOSORBIDE MONONITRATE ER 30 MG PO TB24: 30.0000 mg | ORAL_TABLET | Freq: Every day | ORAL | Status: DC

## 2017-02-24 MED ORDER — TICAGRELOR 90 MG PO TABS
90.0000 mg | ORAL_TABLET | Freq: Two times a day (BID) | ORAL | Status: DC
  Administered 2017-02-24 – 2017-03-01 (×9): 90 mg via ORAL
  Filled 2017-02-24 (×11): qty 1

## 2017-02-24 MED ORDER — MIRTAZAPINE 15 MG PO TABS
15.0000 mg | ORAL_TABLET | Freq: Every day | ORAL | Status: DC
  Administered 2017-02-24 – 2017-02-28 (×5): 15 mg via ORAL
  Filled 2017-02-24 (×5): qty 1

## 2017-02-24 MED ORDER — MORPHINE SULFATE ER 15 MG PO TBCR
15.0000 mg | EXTENDED_RELEASE_TABLET | Freq: Two times a day (BID) | ORAL | Status: DC
  Administered 2017-02-24 – 2017-03-01 (×11): 15 mg via ORAL
  Filled 2017-02-24 (×11): qty 1

## 2017-02-24 MED ORDER — LISINOPRIL 20 MG PO TABS: 20.0000 mg | ORAL_TABLET | Freq: Every day | ORAL | Status: DC

## 2017-02-24 NOTE — Progress Notes (Addendum)
[facility] - [place] at [facility]   PATIENT NAME: Jessica DanceBarbara Donovan    MR#:  [id number]  DATE OF BIRTH:  [birth year]  SUBJECTIVE:  CHIEF COMPLAINT:   Chief Complaint  Patient presents with  . Fall  Seem to be withdrawn and not wanting to talk/disclose much information.  Requesting pain medication REVIEW OF SYSTEMS:  Review of Systems  Constitutional: Positive for malaise/fatigue. Negative for chills, fever and weight loss.  HENT: Negative for nosebleeds and sore throat.   Eyes: Negative for blurred vision.  Respiratory: Negative for cough, shortness of breath and wheezing.   Cardiovascular: Negative for chest pain, orthopnea, leg swelling and PND.  Gastrointestinal: Negative for abdominal pain, constipation, diarrhea, heartburn, nausea and vomiting.  Genitourinary: Negative for dysuria and urgency.  Musculoskeletal: Negative for back pain.  Skin: Negative for rash.  Neurological: Positive for weakness. Negative for dizziness, speech change, focal weakness and headaches.  Endo/Heme/Allergies: Does not bruise/bleed easily.  Psychiatric/Behavioral: Positive for depression.    DRUG ALLERGIES:   Allergies  Allergen Reactions  . Codeine Nausea And Vomiting  . Influenza Vaccines Other (See Comments)    Reaction:  Caused pt to pass out   . Methadone Hives and Itching  . Percocet [Oxycodone-Acetaminophen] Nausea And Vomiting  . Tetanus Toxoids Swelling and Other (See Comments)    Reaction:  Swelling at injection site  . Tetracyclines & Related Rash   VITALS:  Blood pressure (!) 143/66, pulse 82, temperature 98.3 F (36.8 C), temperature source Oral, resp. rate 18, height 5\' 6"  (1.676 m), weight 85.7 kg (188 lb 14.4 oz), SpO2 99 %. PHYSICAL EXAMINATION:  Physical Exam  Constitutional: She is oriented to person, place, and time and well-developed, well-nourished, and in no distress.  HENT:  Head: Normocephalic and atraumatic.  Eyes: Conjunctivae and EOM are  normal. Pupils are equal, round, and reactive to light.  Neck: Normal range of motion. Neck supple. No tracheal deviation present. No thyromegaly present.  Cardiovascular: Normal rate, regular rhythm and normal heart sounds.   Pulmonary/Chest: Effort normal and breath sounds normal. No respiratory distress. She has no wheezes. She exhibits no tenderness.  Abdominal: Soft. Bowel sounds are normal. She exhibits no distension. There is no tenderness.  Musculoskeletal: Normal range of motion.  Neurological: She is alert and oriented to person, place, and time. No cranial nerve deficit.  Skin: Skin is warm and dry. Bruising and ecchymosis noted. No rash noted.  On face and around eyes  Psychiatric: Her affect is inappropriate. She exhibits a depressed mood.   LABORATORY PANEL:  Female CBC  Recent Labs Lab 02/24/17 0436  WBC 5.0  HGB 11.0*  HCT 33.2*  PLT 204   ------------------------------------------------------------------------------------------------------------------ Chemistries   Recent Labs Lab 02/23/17 1059 02/24/17 0436  NA 136 140  K 4.8 4.5  CL 100* 107  CO2 28 30  GLUCOSE 154* 107*  BUN 29* 16  CREATININE 1.12* 0.59  CALCIUM 8.7* 8.2*  AST 57*  --   ALT 15  --   ALKPHOS 51  --   BILITOT 0.4  --    RADIOLOGY:  No results found. ASSESSMENT AND PLAN:  71 y.o. female with a known history of Ch back pain, CAD, Htn, OM, Stroke, - fell on floor and could not get up, so called EMS. Not a very clear historian  *Acute metabolic encephalopathy: Likely multifactorial - Opioid overdose/abuse, Fall, Rhabdomyolysis  -Await psychiatry evaluation -Mental status seems quite close to her baseline now  -Cleared for  swallowing evaluation and started eating now   * rhabdomyolysis -Increase IV fluid rate from 75 cc-> 125 cc an hour -CK coming downtown from 2000 -> 1999  *Hypertension -Restart metoprolol at home dose  * ARF - prerenal - improving with ivf  *  Depression - psych consult. - Continue Remeron, Effexor, trazodone  * Chronic pain. - resume MS-Contin and celebrex - follows with dr in [place] for pain mgmt     All the records are reviewed and case discussed with Care Management/Social Worker. Management plans discussed with the patient, family and they are in agreement.  CODE STATUS: DNR  TOTAL TIME TAKING CARE OF THIS PATIENT: 35 minutes.   More than 50% of the time was spent in counseling/coordination of care: YES  POSSIBLE D/C IN 1-2 DAYS, DEPENDING ON CLINICAL CONDITION.   Delfino Lovett M.D on 02/24/2017 at 4:48 PM  Between 7am to 6pm - Pager - [phone]  After 6pm go to [email or web address] - Social research officer, government  [facility] [place] [facility]  Office  [phone]  CC: Primary care physician; Lauro Regulus., MD  Note: This dictation was prepared with Dragon dictation along with smaller phrase technology. Any transcriptional errors that result from this process are unintentional.

## 2017-02-24 NOTE — Progress Notes (Signed)
PT Cancellation Note  Patient Details Name: Jessica Donovan MRN: [id number] DOB: [birth year]   Cancelled Treatment:    Reason Eval/Treat Not Completed: Other (comment) (Pt eating lunch and requested to finish lunch.)  PT eval attempted at 1425, pt eating lunch and requested to finish lunch. PT informed pt and nurse that PT will re-attempt later this afternoon or tomorrow morning. Pt and nurse agreeable.  Leydi Winstead L 02/24/2017, 2:32 PM   Zerita BoersJennifer Beyonce Sawatzky, PT,DPT 02/24/17 2:33 PM

## 2017-02-24 NOTE — Progress Notes (Signed)
OT Cancellation Note  Patient Details Name: Mellody DanceBarbara Tiegs MRN: [id number] DOB: [birth year]   Cancelled Treatment:    Reason Eval/Treat Not Completed: Patient declined, no reason specified (Pt. pleasantly declined OT intervention. Pt. education was provided about the benefits, and advantages of OT services. Pt. reports she is well aware of the benefits of OT services however, does not need or want the services at this time. Pt. rights were reserved. Will sign off per pt. Request.)  Olegario MessierElaine Sarthak Rubenstein, MS, OTR/L 02/24/2017, 3:45 PM

## 2017-02-24 NOTE — Progress Notes (Addendum)
Patient expressing concerns that all of her home pain medications have not been restarted.  Given education to patient but she requested that MD be paged.  Paged Dr. Sherryll BurgerShah, awaiting callback.    Per Dr. Sherryll BurgerShah, pharmacy is managing pain medications and they will be added back slowly but not all at once.

## 2017-02-24 NOTE — Plan of Care (Signed)
Problem: Education: Goal: Knowledge of [facility] Education information/materials will improve Outcome: Progressing Given handout information on "accidental overdose"

## 2017-02-24 NOTE — Consult Note (Signed)
[facility] Consult   Reason for Consult:  Consult for 71 year old woman with presentation with altered mental status Referring Physician:  Manuella Donovan Patient Identification: Jessica Donovan MRN:  [id number] Principal Diagnosis: Opioid overdose Diagnosis:   Patient Active Problem List   Diagnosis Date Noted  . Opioid overdose [T40.2X1A] 02/23/2017  . Altered mental status [R41.82] 02/23/2017  . Acute colitis [K52.9] 01/19/2017  . Sepsis ([place]) [A41.9] 01/17/2017  . Cellulitis of fourth toe of right foot [L03.031] 04/10/2016  . Somnolence [R40.0] 04/09/2016  . Dementia [F03.90] 04/09/2016  . Cellulitis [L03.90] 04/03/2016  . Hematoma of right parietal scalp [S00.03XA] 02/23/2016  . Multiple falls [R29.6] 02/23/2016  . Physical debility [R53.81] 02/23/2016  . DM2 (diabetes mellitus, type 2) ([place]) [E11.9] 02/23/2016  . Syncope and collapse [R55] 02/23/2016  . Polypharmacy [Z79.899] 02/23/2016  . Osteomyelitis of toe ([place]) [M86.9] 01/23/2016  . Rhabdomyolysis [M62.82] 12/07/2015  . ARF (acute renal failure) ([place]) [N17.9] 11/29/2015  . Hypotension [I95.9] 11/29/2015  . Diabetic osteomyelitis ([place]) [E11.69, M86.9] 11/07/2015  . Angina effort ([place]) [I20.8] 11/07/2015  . Osteomyelitis ([place]) [M86.9] 11/07/2015    Total Time spent with patient: 1 hour  Subjective:   Jessica Donovan is a 71 y.o. female patient admitted with "I guess I fell down".  HPI:  Patient interviewed. Chart reviewed. Known from previous encounters. 71 year old woman presented to the emergency room with altered mental status. The way she tells the story to me some people at her apartment building found her having fallen down on the ground. It says in the emergency room notes that she called 911 herself. Patient says the last thing she remembers now is going about her usual routine at bedtime. She denies absolutely that she took any extra doses of any of her medicines or did anything to try to harm herself. She has  no explanation really for why she would've fallen down although she does say that she has not been eating well recently and has not been sleeping well. Patient has been under quite a bit of stress in the last week. She tells me that the manager at her apartment building told her that she had to get rid of her dog. The dog is now staying with a friend. Patient is very distraught over this. She starts crying and clearly is upset although she didn't again denies any suicidal thoughts. Admits that she is not eating or sleeping very well. Denies any psychotic symptoms. Patient as usual shows no insight or willingness to consider that all of the prescription sedative she takes could be a problem.  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 Chronic pain and is on chronic narcotic treatment.  Social history: Patient is a widow. Lives independently in an apartment at [place]. Her [place] substance abuse history: Denies any history of alcohol or drug abuse. She has had a previous visit to our hospital also with what appeared to be an overdose of medicine although it's not clear that any of these were intentional. She is on chronic high doses of benzodiazepines and narcotics and other sedatives and is at chronic risk of interactions and problems from them.  Past Psychiatric History: Patient was treated for depression after the death of her husband. Denies any history of suicide attempts. Has been on venlafaxine and Xanax chronically. Denies any history of psychiatric hospitalization. Denies any history of suicide attempts.  Risk to Self: Is patient at risk for suicide?: No Risk to Others:   Prior Inpatient Therapy:   Prior  Outpatient Therapy:    Past Medical History:  Past Medical History:  Diagnosis Date  . Chronic back pain   . Collagen vascular disease ([place])   . Coronary artery disease   . Diabetes mellitus without complication ([place])   . Hypertension   . Low back pain   . Myocardial  infarction   . Osteomyelitis of toe ([place]) 01/23/2016  . Stroke [facility])     Past Surgical History:  Procedure Laterality Date  . AMPUTATION TOE Left 11/10/2015   Procedure: AMPUTATION TOE;  Surgeon: Sharlotte Alamo, MD;  Location: [facility] ORS;  Service: Podiatry;  Laterality: Left;  . AMPUTATION TOE Left 01/24/2016   Procedure: AMPUTATION TOE (2nd mpj);  Surgeon: Sharlotte Alamo, DPM;  Location: [facility] ORS;  Service: Podiatry;  Laterality: Left;  . CARDIAC CATHETERIZATION N/A 11/12/2015   Procedure: Left Heart Cath and Coronary Angiography;  Surgeon: Isaias Cowman, MD;  Location: [place] [facility] CV LAB;  Service: Cardiovascular;  Laterality: N/A;  . TOE AMPUTATION     Family History:  Family History  Problem Relation Age of Onset  . CAD Mother   . CAD Father    Family Psychiatric  History: Denies any Social History:  History  Alcohol Use No     History  Drug Use No    Social History   Social History  . Marital status: Widowed    Spouse name: N/A  . Number of children: N/A  . Years of education: N/A   Social History Main Topics  . Smoking status: Never Smoker  . Smokeless tobacco: Never Used  . Alcohol use No  . Drug use: No  . Sexual activity: Not Currently   Other Topics Concern  . None   Social History Narrative  . None   Additional Social History:    Allergies:   Allergies  Allergen Reactions  . Codeine Nausea And Vomiting  . Influenza Vaccines Other (See Comments)    Reaction:  Caused pt to pass out   . Methadone Hives and Itching  . Percocet [Oxycodone-Acetaminophen] Nausea And Vomiting  . Tetanus Toxoids Swelling and Other (See Comments)    Reaction:  Swelling at injection site  . Tetracyclines & Related Rash    Labs:  Results for orders placed or performed during the hospital encounter of 02/23/17 (from the past 48 hour(s))  CBC     Status: Abnormal   Collection Time: 02/23/17 10:59 AM  Result Value Ref Range   WBC 7.1 3.6 - 11.0 K/uL   RBC 3.37 (L) 3.80 -  5.20 MIL/uL   Hemoglobin 11.4 (L) 12.0 - 16.0 g/dL   HCT 34.6 (L) 35.0 - 47.0 %   MCV 102.6 (H) 80.0 - 100.0 fL   MCH 33.8 26.0 - 34.0 pg   MCHC 33.0 32.0 - 36.0 g/dL   RDW 15.0 (H) 11.5 - 14.5 %   Platelets 241 150 - 440 K/uL  Comprehensive metabolic panel     Status: Abnormal   Collection Time: 02/23/17 10:59 AM  Result Value Ref Range   Sodium 136 135 - 145 mmol/L   Potassium 4.8 3.5 - 5.1 mmol/L   Chloride 100 (L) 101 - 111 mmol/L   CO2 28 22 - 32 mmol/L   Glucose, Bld 154 (H) 65 - 99 mg/dL   BUN 29 (H) 6 - 20 mg/dL   Creatinine, Ser 1.12 (H) 0.44 - 1.00 mg/dL   Calcium 8.7 (L) 8.9 - 10.3 mg/dL   Total Protein 5.9 (L) 6.5 - 8.1  g/dL   Albumin 3.5 3.5 - 5.0 g/dL   AST 57 (H) 15 - 41 U/L   ALT 15 14 - 54 U/L   Alkaline Phosphatase 51 38 - 126 U/L   Total Bilirubin 0.4 0.3 - 1.2 mg/dL   GFR calc non Af Amer 49 (L) >60 mL/min   GFR calc Af Amer 56 (L) >60 mL/min    Comment: (NOTE) The eGFR has been calculated using the CKD EPI equation. This calculation has not been validated in all clinical situations. eGFR's persistently <60 mL/min signify possible Chronic Kidney Disease.    Anion gap 8 5 - 15  Ammonia     Status: None   Collection Time: 02/23/17 10:59 AM  Result Value Ref Range   Ammonia 9 9 - 35 umol/L  Ethanol     Status: None   Collection Time: 02/23/17 10:59 AM  Result Value Ref Range   Alcohol, Ethyl (B) <5 <5 mg/dL    Comment:        LOWEST DETECTABLE LIMIT FOR SERUM ALCOHOL IS 5 mg/dL FOR MEDICAL PURPOSES ONLY   Troponin I     Status: None   Collection Time: 02/23/17 10:59 AM  Result Value Ref Range   Troponin I <0.03 <0.03 ng/mL  Lactic acid, plasma     Status: None   Collection Time: 02/23/17 10:59 AM  Result Value Ref Range   Lactic Acid, Venous 1.6 0.5 - 1.9 mmol/L  CK     Status: Abnormal   Collection Time: 02/23/17 10:59 AM  Result Value Ref Range   Total CK 2,037 (H) 38 - 234 U/L  Blood culture (routine x 2)     Status: None (Preliminary  result)   Collection Time: 02/23/17 11:00 AM  Result Value Ref Range   Specimen Description BLOOD RIGHT WRIST    Special Requests BOTTLES DRAWN AEROBIC AND ANAEROBIC BCAV    Culture NO GROWTH < 24 HOURS    Report Status PENDING   Blood gas, venous     Status: Abnormal   Collection Time: 02/23/17 11:02 AM  Result Value Ref Range   pH, Ven 7.28 7.250 - 7.430   pCO2, Ven 77 (HH) 44.0 - 60.0 mmHg    Comment: CRITICAL RESULT, NOTIFIED PHYSICIAN VALERIE TAMBOR 1110 [id number] RDW    Bicarbonate 36.2 (H) 20.0 - 28.0 mmol/L   Acid-Base Excess 7.2 (H) 0.0 - 2.0 mmol/L   Patient temperature 37.0    Collection site VEIN    Sample type VEIN   Blood culture (routine x 2)     Status: None (Preliminary result)   Collection Time: 02/23/17 11:02 AM  Result Value Ref Range   Specimen Description BLOOD RIGHT HAND    Special Requests BOTTLES DRAWN AEROBIC AND ANAEROBIC BCAV    Culture NO GROWTH < 24 HOURS    Report Status PENDING   Urinalysis, Complete w Microscopic     Status: Abnormal   Collection Time: 02/23/17  1:10 PM  Result Value Ref Range   Color, Urine YELLOW (A) YELLOW   APPearance CLEAR (A) CLEAR   Specific Gravity, Urine 1.017 1.005 - 1.030   pH 5.0 5.0 - 8.0   Glucose, UA NEGATIVE NEGATIVE mg/dL   Hgb urine dipstick SMALL (A) NEGATIVE   Bilirubin Urine NEGATIVE NEGATIVE   Ketones, ur NEGATIVE NEGATIVE mg/dL   Protein, ur NEGATIVE NEGATIVE mg/dL   Nitrite NEGATIVE NEGATIVE   Leukocytes, UA NEGATIVE NEGATIVE   RBC / HPF 0-5 0 - 5 RBC/hpf  WBC, UA 0-5 0 - 5 WBC/hpf   Bacteria, UA NONE SEEN NONE SEEN   Squamous Epithelial / LPF 0-5 (A) NONE SEEN   Mucous PRESENT    Hyaline Casts, UA PRESENT   Urine Drug Screen, Qualitative ([facility] only)     Status: Abnormal   Collection Time: 02/23/17  1:10 PM  Result Value Ref Range   Tricyclic, Ur Screen POSITIVE (A) NONE DETECTED   Amphetamines, Ur Screen POSITIVE (A) NONE DETECTED   MDMA (Ecstasy)Ur Screen NONE DETECTED NONE DETECTED    Cocaine Metabolite,Ur [place] NONE DETECTED NONE DETECTED   Opiate, Ur Screen POSITIVE (A) NONE DETECTED   Phencyclidine (PCP) Ur S NONE DETECTED NONE DETECTED   Cannabinoid 50 Ng, Ur [place] NONE DETECTED NONE DETECTED   Barbiturates, Ur Screen NONE DETECTED NONE DETECTED   Benzodiazepine, Ur Scrn POSITIVE (A) NONE DETECTED   Methadone Scn, Ur NONE DETECTED NONE DETECTED    Comment: (NOTE) 295  Tricyclics, urine               Cutoff 1000 ng/mL 200  Amphetamines, urine             Cutoff 1000 ng/mL 300  MDMA (Ecstasy), urine           Cutoff 500 ng/mL 400  Cocaine Metabolite, urine       Cutoff 300 ng/mL 500  Opiate, urine                   Cutoff 300 ng/mL 600  Phencyclidine (PCP), urine      Cutoff 25 ng/mL 700  Cannabinoid, urine              Cutoff 50 ng/mL 800  Barbiturates, urine             Cutoff 200 ng/mL 900  Benzodiazepine, urine           Cutoff 200 ng/mL 1000 Methadone, urine                Cutoff 300 ng/mL 1100 1200 The urine drug screen provides only a preliminary, unconfirmed 1300 analytical test result and should not be used for non-medical 1400 purposes. Clinical consideration and professional judgment should 1500 be applied to any positive drug screen result due to possible 1600 interfering substances. A more specific alternate chemical method 1700 must be used in order to obtain a confirmed analytical result.  1800 Gas chromato graphy / mass spectrometry (GC/MS) is the preferred 1900 confirmatory method.   MRSA PCR Screening     Status: None   Collection Time: 02/23/17  4:38 PM  Result Value Ref Range   MRSA by PCR NEGATIVE NEGATIVE    Comment:        The GeneXpert MRSA Assay (FDA approved for NASAL specimens only), is one component of a comprehensive MRSA colonization surveillance program. It is not intended to diagnose MRSA infection nor to guide or monitor treatment for MRSA infections.   Lactic acid, plasma     Status: None   Collection Time: 02/23/17  4:47  PM  Result Value Ref Range   Lactic Acid, Venous 1.2 0.5 - 1.9 mmol/L  Glucose, capillary     Status: Abnormal   Collection Time: 02/23/17  5:05 PM  Result Value Ref Range   Glucose-Capillary 124 (H) 65 - 99 mg/dL  Glucose, capillary     Status: Abnormal   Collection Time: 02/23/17  8:16 PM  Result Value Ref Range   Glucose-Capillary 115 (H) 65 -  99 mg/dL  Basic metabolic panel     Status: Abnormal   Collection Time: 02/24/17  4:36 AM  Result Value Ref Range   Sodium 140 135 - 145 mmol/L   Potassium 4.5 3.5 - 5.1 mmol/L   Chloride 107 101 - 111 mmol/L   CO2 30 22 - 32 mmol/L   Glucose, Bld 107 (H) 65 - 99 mg/dL   BUN 16 6 - 20 mg/dL   Creatinine, Ser 0.59 0.44 - 1.00 mg/dL   Calcium 8.2 (L) 8.9 - 10.3 mg/dL   GFR calc non Af Amer >60 >60 mL/min   GFR calc Af Amer >60 >60 mL/min    Comment: (NOTE) The eGFR has been calculated using the CKD EPI equation. This calculation has not been validated in all clinical situations. eGFR's persistently <60 mL/min signify possible Chronic Kidney Disease.    Anion gap 3 (L) 5 - 15  CBC     Status: Abnormal   Collection Time: 02/24/17  4:36 AM  Result Value Ref Range   WBC 5.0 3.6 - 11.0 K/uL   RBC 3.22 (L) 3.80 - 5.20 MIL/uL   Hemoglobin 11.0 (L) 12.0 - 16.0 g/dL   HCT 33.2 (L) 35.0 - 47.0 %   MCV 103.0 (H) 80.0 - 100.0 fL   MCH 34.3 (H) 26.0 - 34.0 pg   MCHC 33.3 32.0 - 36.0 g/dL   RDW 14.4 11.5 - 14.5 %   Platelets 204 150 - 440 K/uL  CK     Status: Abnormal   Collection Time: 02/24/17  4:36 AM  Result Value Ref Range   Total CK 1,999 (H) 38 - 234 U/L  Glucose, capillary     Status: Abnormal   Collection Time: 02/24/17  7:38 AM  Result Value Ref Range   Glucose-Capillary 122 (H) 65 - 99 mg/dL  Glucose, capillary     Status: Abnormal   Collection Time: 02/24/17 11:56 AM  Result Value Ref Range   Glucose-Capillary 173 (H) 65 - 99 mg/dL  CK     Status: Abnormal   Collection Time: 02/24/17  2:56 PM  Result Value Ref Range    Total CK 1,343 (H) 38 - 234 U/L    Comment: RESULT CONFIRMED BY MANUAL DILUTION.  KLW/TFK  Glucose, capillary     Status: Abnormal   Collection Time: 02/24/17  4:42 PM  Result Value Ref Range   Glucose-Capillary 147 (H) 65 - 99 mg/dL    Current Facility-Administered Medications  Medication Dose Route Frequency Provider Last Rate Last Dose  . 0.9 %  sodium chloride infusion   Intravenous Continuous Max Sane, MD 125 mL/hr at 02/24/17 1804    . ALPRAZolam Duanne Moron) tablet 1 mg  1 mg Oral TID Max Sane, MD   1 mg at 02/24/17 1803  . aspirin EC tablet 81 mg  81 mg Oral Daily Vipul Shah, MD   81 mg at 02/24/17 1002  . celecoxib (CELEBREX) capsule 200 mg  200 mg Oral Daily Max Sane, MD   200 mg at 02/24/17 1103  . cyclobenzaprine (FLEXERIL) tablet 10 mg  10 mg Oral TID PRN Max Sane, MD      . docusate sodium (COLACE) capsule 100 mg  100 mg Oral BID PRN Vaughan Basta, MD      . enoxaparin (LOVENOX) injection 40 mg  40 mg Subcutaneous Q24H Vipul Shah, MD      . Derrill Memo ON 02/25/2017] ferrous sulfate tablet 325 mg  325 mg Oral Q breakfast  Max Sane, MD      . fesoterodine (TOVIAZ) tablet 8 mg  8 mg Oral Daily Max Sane, MD   8 mg at 02/24/17 1003  . insulin aspart (novoLOG) injection 0-9 Units  0-9 Units Subcutaneous TID WC Vaughan Basta, MD   1 Units at 02/24/17 1803  . insulin detemir (LEVEMIR) injection 20 Units  20 Units Subcutaneous Q24H Max Sane, MD   20 Units at 02/24/17 1832  . lisinopril (PRINIVIL,ZESTRIL) tablet 10 mg  10 mg Oral Daily Max Sane, MD   10 mg at 02/24/17 1831  . metoprolol tartrate (LOPRESSOR) tablet 25 mg  25 mg Oral BID Max Sane, MD   25 mg at 02/24/17 1831  . mirtazapine (REMERON) tablet 15 mg  15 mg Oral QHS Vipul Shah, MD      . morphine (MS CONTIN) 12 hr tablet 15 mg  15 mg Oral Q12H Vipul Shah, MD   15 mg at 02/24/17 1002  . multivitamin with minerals tablet 1 tablet  1 tablet Oral Daily Max Sane, MD   1 tablet at 02/24/17 1002  .  pantoprazole (PROTONIX) EC tablet 40 mg  40 mg Oral Daily Max Sane, MD   40 mg at 02/24/17 1003  . pregabalin (LYRICA) capsule 150 mg  150 mg Oral BID Max Sane, MD   150 mg at 02/24/17 1002  . sodium chloride flush (NS) 0.9 % injection 3 mL  3 mL Intravenous Q12H Vaughan Basta, MD      . ticagrelor (BRILINTA) tablet 90 mg  90 mg Oral BID Max Sane, MD   90 mg at 02/24/17 1002  . traZODone (DESYREL) tablet 100 mg  100 mg Oral QHS Max Sane, MD      . Derrill Memo ON 02/25/2017] venlafaxine XR (EFFEXOR-XR) 24 hr capsule 150 mg  150 mg Oral Q breakfast Max Sane, MD      . Derrill Memo ON 02/28/2017] Vitamin D (Ergocalciferol) (DRISDOL) capsule 50,000 Units  50,000 Units Oral Q7 days Max Sane, MD        Musculoskeletal: Strength & Muscle Tone: decreased Gait & Station: unsteady Patient leans: N/A  Psychiatric Specialty Exam: Physical Exam  Nursing note and vitals reviewed. Constitutional: She appears well-developed and well-nourished.  HENT:  Head: Normocephalic and atraumatic.  Eyes: Conjunctivae are normal. Pupils are equal, round, and reactive to light.  Neck: Normal range of motion.  Cardiovascular: Regular rhythm and normal heart sounds.   Respiratory: Effort normal. No respiratory distress.  GI: Soft.  Musculoskeletal: Normal range of motion.  Neurological: She is alert.  Skin: Skin is warm and dry.  Psychiatric: Her speech is normal and behavior is normal. Thought content is not paranoid. She expresses inappropriate judgment. She exhibits a depressed mood. She expresses no homicidal and no suicidal ideation. She exhibits abnormal recent memory.    Review of Systems  Constitutional: Negative.   HENT: Negative.   Eyes: Negative.   Respiratory: Negative.   Cardiovascular: Negative.   Gastrointestinal: Negative.   Musculoskeletal: Negative.   Skin: Negative.   Neurological: Negative.   Psychiatric/Behavioral: Positive for depression and memory loss. Negative for  hallucinations, substance abuse and suicidal ideas. The patient is nervous/anxious and has insomnia.     Blood pressure (!) 156/69, pulse 79, temperature 98.3 F (36.8 C), temperature source Oral, resp. rate 18, height '5\' 6"'  (1.676 m), weight 85.7 kg (188 lb 14.4 oz), SpO2 99 %.Body mass index is 30.49 kg/m.  General Appearance: Casual  Eye Contact:  Fair  Speech:  Slow  Volume:  Decreased  Mood:  Dysphoric  Affect:  Depressed and Tearful  Thought Process:  Goal Directed  Orientation:  Full (Time, Place, and Person)  Thought Content:  Tangential  Suicidal Thoughts:  No  Homicidal Thoughts:  No  Memory:  Immediate;   Good Recent;   Fair Remote;   Poor  Judgement:  Impaired  Insight:  Shallow  Psychomotor Activity:  Decreased  Concentration:  Concentration: Fair  Recall:  [employer] of Knowledge:  Fair  Language:  Fair  Akathisia:  No  Handed:  Right  AIMS (if indicated):     Assets:  Desire for Improvement Housing Resilience  ADL's:  Intact  Cognition:  Impaired,  Mild  Sleep:        Treatment Plan Summary: Daily contact with patient to assess and evaluate symptoms and progress in treatment, Medication management and Plan 71 year old woman who presented to the hospital with altered mental status quite likely related to her medication although there is no evidence that she was trying to harm herself. I tend to believe her that she was not trying to overdose although she clearly is at high risk of accidental problems from her medication. I tried to discuss this with her again but she has little interest in wanting to change her medicine. I'm more concerned right now in a way about how fearful she becomes. Does seem like she is more depressed. Does not however meet commitment criteria or clearly require hospitalization. I will follow-up as needed. No need for involuntary commitment.  Disposition: Patient does not meet criteria for psychiatric inpatient admission. Supportive  therapy provided about ongoing stressors.  Alethia Berthold, MD 02/24/2017 7:07 PM

## 2017-02-25 LAB — CBC
HCT: 36.9 % (ref 35.0–47.0)
Hemoglobin: 12.1 g/dL (ref 12.0–16.0)
MCH: 33 pg (ref 26.0–34.0)
MCHC: 32.6 g/dL (ref 32.0–36.0)
MCV: 101 fL — ABNORMAL HIGH (ref 80.0–100.0)
PLATELETS: 225 10*3/uL (ref 150–440)
RBC: 3.66 MIL/uL — AB (ref 3.80–5.20)
RDW: 14.2 % (ref 11.5–14.5)
WBC: 5.2 10*3/uL (ref 3.6–11.0)

## 2017-02-25 LAB — BASIC METABOLIC PANEL
ANION GAP: 4 — AB (ref 5–15)
BUN: 10 mg/dL (ref 6–20)
CO2: 26 mmol/L (ref 22–32)
Calcium: 9 mg/dL (ref 8.9–10.3)
Chloride: 109 mmol/L (ref 101–111)
Creatinine, Ser: 0.47 mg/dL (ref 0.44–1.00)
GFR calc Af Amer: >60 mL/min (ref >60)
GLUCOSE: 82 mg/dL (ref 65–99)
Potassium: 3.4 mmol/L — ABNORMAL LOW (ref 3.5–5.1)
SODIUM: 139 mmol/L (ref 135–145)

## 2017-02-25 LAB — CK
CK TOTAL: 1003 U/L — AB (ref 38–234)
CK TOTAL: 819 U/L — AB (ref 38–234)
CK TOTAL: 707 U/L — AB (ref 38–234)
CK TOTAL: 598 U/L — AB (ref 38–234)

## 2017-02-25 LAB — GLUCOSE, CAPILLARY
GLUCOSE-CAPILLARY: 117 mg/dL — AB (ref 65–99)
Glucose-Capillary: 74 mg/dL (ref 65–99)
Glucose-Capillary: 117 mg/dL — ABNORMAL HIGH (ref 65–99)
Glucose-Capillary: 124 mg/dL — ABNORMAL HIGH (ref 65–99)

## 2017-02-25 MED ORDER — ISOSORBIDE MONONITRATE ER 30 MG PO TB24
30.0000 mg | ORAL_TABLET | Freq: Every day | ORAL | Status: DC
  Administered 2017-02-25 – 2017-03-01 (×5): 30 mg via ORAL
  Filled 2017-02-25 (×5): qty 1

## 2017-02-25 MED ORDER — LORATADINE 10 MG PO TABS
10.0000 mg | ORAL_TABLET | Freq: Every day | ORAL | Status: DC
  Administered 2017-02-25 – 2017-03-01 (×5): 10 mg via ORAL
  Filled 2017-02-25 (×5): qty 1

## 2017-02-25 MED ORDER — POTASSIUM CHLORIDE CRYS ER 20 MEQ PO TBCR
40.0000 meq | EXTENDED_RELEASE_TABLET | Freq: Once | ORAL | Status: AC
  Administered 2017-02-25: 16:00:00 40 meq via ORAL
  Filled 2017-02-25: qty 2

## 2017-02-25 NOTE — Care Management Important Message (Signed)
Important Message  Patient Details  Name: Jessica DanceBarbara Marzan MRN: [id number] Date of Birth: 06/25/1946   Medicare Important Message Given:  Yes    [name], RN 02/25/2017, 9:42 AM

## 2017-02-25 NOTE — Consult Note (Signed)
[facility] Psychiatry Consult   Reason for Consult:  Consult for 71 year old woman with presentation with altered mental status Referring Physician:  Manuella Ghazi Patient Identification: Jessica Donovan MRN:  [id number] Principal Diagnosis: Opioid overdose Diagnosis:   Patient Active Problem List   Diagnosis Date Noted  . Opioid overdose [T40.2X1A] 02/23/2017  . Altered mental status [R41.82] 02/23/2017  . Acute colitis [K52.9] 01/19/2017  . Sepsis ([place]) [A41.9] 01/17/2017  . Cellulitis of fourth toe of right foot [L03.031] 04/10/2016  . Somnolence [R40.0] 04/09/2016  . Dementia [F03.90] 04/09/2016  . Cellulitis [L03.90] 04/03/2016  . Hematoma of right parietal scalp [S00.03XA] 02/23/2016  . Multiple falls [R29.6] 02/23/2016  . Physical debility [R53.81] 02/23/2016  . DM2 (diabetes mellitus, type 2) ([place]) [E11.9] 02/23/2016  . Syncope and collapse [R55] 02/23/2016  . Polypharmacy [Z79.899] 02/23/2016  . Osteomyelitis of toe ([place]) [M86.9] 01/23/2016  . Rhabdomyolysis [M62.82] 12/07/2015  . ARF (acute renal failure) ([place]) [N17.9] 11/29/2015  . Hypotension [I95.9] 11/29/2015  . Diabetic osteomyelitis ([place]) [E11.69, M86.9] 11/07/2015  . Angina effort ([place]) [I20.8] 11/07/2015  . Osteomyelitis ([place]) [M86.9] 11/07/2015    Total Time spent with patient: 20 minutes  Subjective:   Jessica Donovan is a 71 y.o. female patient admitted with "I guess I fell down".  Follow-up note for Thursday the eighth. Patient seen chart reviewed. Spoke also with nursing earlier today. Some representatives from her apartment came in and expressed worries about whether she had intentionally tried to overdose. On interview this afternoon the patient is awake and alert and cooperative. Seems to be reasonably forthcoming as much as yesterday. Her affect is calm her. She says she is feeling better. I directly spoke with her about whether she had tried to intentionally overdose and she once again denied it completely.  Stated she had positive things that she was looking forward to and reasons to live. Says her mood is feeling better. Patient has been cooperative here in the hospital.  HPI:  Patient interviewed. Chart reviewed. Known from previous encounters. 71 year old woman presented to the emergency room with altered mental status. The way she tells the story to me some people at her apartment building found her having fallen down on the ground. It says in the emergency room notes that she called 911 herself. Patient says the last thing she remembers now is going about her usual routine at bedtime. She denies absolutely that she took any extra doses of any of her medicines or did anything to try to harm herself. She has no explanation really for why she would've fallen down although she does say that she has not been eating well recently and has not been sleeping well. Patient has been under quite a bit of stress in the last week. She tells me that the manager at her apartment building told her that she had to get rid of her dog. The dog is now staying with a friend. Patient is very distraught over this. She starts crying and clearly is upset although she didn't again denies any suicidal thoughts. Admits that she is not eating or sleeping very well. Denies any psychotic symptoms. Patient as usual shows no insight or willingness to consider that all of the prescription sedative she takes could be a problem.  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 Chronic pain and is on chronic narcotic treatment.  Social history: Patient is a widow. Lives independently in an apartment at [place]. Her [place] substance abuse history: Denies any history of alcohol or drug  abuse. She has had a previous visit to our hospital also with what appeared to be an overdose of medicine although it's not clear that any of these were intentional. She is on chronic high doses of benzodiazepines and narcotics and other sedatives and  is at chronic risk of interactions and problems from them.  Past Psychiatric History: Patient was treated for depression after the death of her husband. Denies any history of suicide attempts. Has been on venlafaxine and Xanax chronically. Denies any history of psychiatric hospitalization. Denies any history of suicide attempts.  Risk to Self: Is patient at risk for suicide?: No Risk to Others:   Prior Inpatient Therapy:   Prior Outpatient Therapy:    Past Medical History:  Past Medical History:  Diagnosis Date  . Chronic back pain   . Collagen vascular disease ([place])   . Coronary artery disease   . Diabetes mellitus without complication ([place])   . Hypertension   . Low back pain   . Myocardial infarction   . Osteomyelitis of toe ([place]) 01/23/2016  . Stroke [facility])     Past Surgical History:  Procedure Laterality Date  . AMPUTATION TOE Left 11/10/2015   Procedure: AMPUTATION TOE;  Surgeon: Sharlotte Alamo, MD;  Location: [facility] ORS;  Service: Podiatry;  Laterality: Left;  . AMPUTATION TOE Left 01/24/2016   Procedure: AMPUTATION TOE (2nd mpj);  Surgeon: Sharlotte Alamo, DPM;  Location: [facility] ORS;  Service: Podiatry;  Laterality: Left;  . CARDIAC CATHETERIZATION N/A 11/12/2015   Procedure: Left Heart Cath and Coronary Angiography;  Surgeon: Isaias Cowman, MD;  Location: [facility] CV LAB;  Service: Cardiovascular;  Laterality: N/A;  . TOE AMPUTATION     Family History:  Family History  Problem Relation Age of Onset  . CAD Mother   . CAD Father    Family Psychiatric  History: Denies any Social History:  History  Alcohol Use No     History  Drug Use No    Social History   Social History  . Marital status: Widowed    Spouse name: N/A  . Number of children: N/A  . Years of education: N/A   Social History Main Topics  . Smoking status: Never Smoker  . Smokeless tobacco: Never Used  . Alcohol use No  . Drug use: No  . Sexual activity: Not Currently   Other Topics Concern  .  None   Social History Narrative  . None   Additional Social History:    Allergies:   Allergies  Allergen Reactions  . Codeine Nausea And Vomiting  . Influenza Vaccines Other (See Comments)    Reaction:  Caused pt to pass out   . Methadone Hives and Itching  . Percocet [Oxycodone-Acetaminophen] Nausea And Vomiting  . Tetanus Toxoids Swelling and Other (See Comments)    Reaction:  Swelling at injection site  . Tetracyclines & Related Rash    Labs:  Results for orders placed or performed during the hospital encounter of 02/23/17 (from the past 48 hour(s))  Glucose, capillary     Status: Abnormal   Collection Time: 02/23/17  5:05 PM  Result Value Ref Range   Glucose-Capillary 124 (H) 65 - 99 mg/dL  Glucose, capillary     Status: Abnormal   Collection Time: 02/23/17  8:16 PM  Result Value Ref Range   Glucose-Capillary 115 (H) 65 - 99 mg/dL  Basic metabolic panel     Status: Abnormal   Collection Time: 02/24/17  4:36 AM  Result Value  Ref Range   Sodium 140 135 - 145 mmol/L   Potassium 4.5 3.5 - 5.1 mmol/L   Chloride 107 101 - 111 mmol/L   CO2 30 22 - 32 mmol/L   Glucose, Bld 107 (H) 65 - 99 mg/dL   BUN 16 6 - 20 mg/dL   Creatinine, Ser 0.59 0.44 - 1.00 mg/dL   Calcium 8.2 (L) 8.9 - 10.3 mg/dL   GFR calc non Af Amer >60 >60 mL/min   GFR calc Af Amer >60 >60 mL/min    Comment: (NOTE) The eGFR has been calculated using the CKD EPI equation. This calculation has not been validated in all clinical situations. eGFR's persistently <60 mL/min signify possible Chronic Kidney Disease.    Anion gap 3 (L) 5 - 15  CBC     Status: Abnormal   Collection Time: 02/24/17  4:36 AM  Result Value Ref Range   WBC 5.0 3.6 - 11.0 K/uL   RBC 3.22 (L) 3.80 - 5.20 MIL/uL   Hemoglobin 11.0 (L) 12.0 - 16.0 g/dL   HCT 33.2 (L) 35.0 - 47.0 %   MCV 103.0 (H) 80.0 - 100.0 fL   MCH 34.3 (H) 26.0 - 34.0 pg   MCHC 33.3 32.0 - 36.0 g/dL   RDW 14.4 11.5 - 14.5 %   Platelets 204 150 - 440 K/uL  CK      Status: Abnormal   Collection Time: 02/24/17  4:36 AM  Result Value Ref Range   Total CK 1,999 (H) 38 - 234 U/L  Glucose, capillary     Status: Abnormal   Collection Time: 02/24/17  7:38 AM  Result Value Ref Range   Glucose-Capillary 122 (H) 65 - 99 mg/dL  Glucose, capillary     Status: Abnormal   Collection Time: 02/24/17 11:56 AM  Result Value Ref Range   Glucose-Capillary 173 (H) 65 - 99 mg/dL  CK     Status: Abnormal   Collection Time: 02/24/17  2:56 PM  Result Value Ref Range   Total CK 1,343 (H) 38 - 234 U/L    Comment: RESULT CONFIRMED BY MANUAL DILUTION.  KLW/TFK  Glucose, capillary     Status: Abnormal   Collection Time: 02/24/17  4:42 PM  Result Value Ref Range   Glucose-Capillary 147 (H) 65 - 99 mg/dL  Glucose, capillary     Status: Abnormal   Collection Time: 02/24/17  8:28 PM  Result Value Ref Range   Glucose-Capillary 155 (H) 65 - 99 mg/dL  CK     Status: Abnormal   Collection Time: 02/24/17  8:55 PM  Result Value Ref Range   Total CK 1,003 (H) 38 - 234 U/L    Comment: HEMOLYSIS AT THIS LEVEL MAY AFFECT RESULT  CK     Status: Abnormal   Collection Time: 02/25/17  4:20 AM  Result Value Ref Range   Total CK 819 (H) 38 - 234 U/L  CBC     Status: Abnormal   Collection Time: 02/25/17  4:20 AM  Result Value Ref Range   WBC 5.2 3.6 - 11.0 K/uL   RBC 3.66 (L) 3.80 - 5.20 MIL/uL   Hemoglobin 12.1 12.0 - 16.0 g/dL   HCT 36.9 35.0 - 47.0 %   MCV 101.0 (H) 80.0 - 100.0 fL   MCH 33.0 26.0 - 34.0 pg   MCHC 32.6 32.0 - 36.0 g/dL   RDW 14.2 11.5 - 14.5 %   Platelets 225 150 - 440 K/uL  Basic metabolic panel  Status: Abnormal   Collection Time: 02/25/17  4:20 AM  Result Value Ref Range   Sodium 139 135 - 145 mmol/L   Potassium 3.4 (L) 3.5 - 5.1 mmol/L   Chloride 109 101 - 111 mmol/L   CO2 26 22 - 32 mmol/L   Glucose, Bld 82 65 - 99 mg/dL   BUN 10 6 - 20 mg/dL   Creatinine, Ser 0.47 0.44 - 1.00 mg/dL   Calcium 9.0 8.9 - 10.3 mg/dL   GFR calc non Af Amer >60  >60 mL/min   GFR calc Af Amer >60 >60 mL/min    Comment: (NOTE) The eGFR has been calculated using the CKD EPI equation. This calculation has not been validated in all clinical situations. eGFR's persistently <60 mL/min signify possible Chronic Kidney Disease.    Anion gap 4 (L) 5 - 15  Glucose, capillary     Status: None   Collection Time: 02/25/17  8:11 AM  Result Value Ref Range   Glucose-Capillary 74 65 - 99 mg/dL  CK     Status: Abnormal   Collection Time: 02/25/17  8:58 AM  Result Value Ref Range   Total CK 707 (H) 38 - 234 U/L  Glucose, capillary     Status: Abnormal   Collection Time: 02/25/17 12:01 PM  Result Value Ref Range   Glucose-Capillary 117 (H) 65 - 99 mg/dL  Glucose, capillary     Status: Abnormal   Collection Time: 02/25/17  4:16 PM  Result Value Ref Range   Glucose-Capillary 117 (H) 65 - 99 mg/dL    Current Facility-Administered Medications  Medication Dose Route Frequency Provider Last Rate Last Dose  . 0.9 %  sodium chloride infusion   Intravenous Continuous Max Sane, MD 125 mL/hr at 02/25/17 0200    . ALPRAZolam Duanne Moron) tablet 1 mg  1 mg Oral TID Max Sane, MD   1 mg at 02/25/17 1620  . aspirin EC tablet 81 mg  81 mg Oral Daily Max Sane, MD   81 mg at 02/25/17 1236  . celecoxib (CELEBREX) capsule 200 mg  200 mg Oral Daily Max Sane, MD   200 mg at 02/25/17 1237  . cyclobenzaprine (FLEXERIL) tablet 10 mg  10 mg Oral TID PRN Max Sane, MD      . docusate sodium (COLACE) capsule 100 mg  100 mg Oral BID PRN Vaughan Basta, MD      . enoxaparin (LOVENOX) injection 40 mg  40 mg Subcutaneous Q24H Max Sane, MD   40 mg at 02/24/17 2227  . ferrous sulfate tablet 325 mg  325 mg Oral Q breakfast Max Sane, MD   325 mg at 02/25/17 1236  . fesoterodine (TOVIAZ) tablet 8 mg  8 mg Oral Daily Max Sane, MD   8 mg at 02/25/17 1237  . insulin aspart (novoLOG) injection 0-9 Units  0-9 Units Subcutaneous TID WC Vaughan Basta, MD   1 Units at 02/24/17  1803  . insulin detemir (LEVEMIR) injection 20 Units  20 Units Subcutaneous Q24H Max Sane, MD   20 Units at 02/25/17 1620  . isosorbide mononitrate (IMDUR) 24 hr tablet 30 mg  30 mg Oral Daily Max Sane, MD   30 mg at 02/25/17 1237  . lisinopril (PRINIVIL,ZESTRIL) tablet 10 mg  10 mg Oral Daily Max Sane, MD   10 mg at 02/25/17 1237  . loratadine (CLARITIN) tablet 10 mg  10 mg Oral Daily Max Sane, MD   10 mg at 02/25/17 1236  . metoprolol  tartrate (LOPRESSOR) tablet 25 mg  25 mg Oral BID Max Sane, MD   25 mg at 02/25/17 1237  . mirtazapine (REMERON) tablet 15 mg  15 mg Oral QHS Max Sane, MD   15 mg at 02/24/17 2228  . morphine (MS CONTIN) 12 hr tablet 15 mg  15 mg Oral Q12H Vipul Shah, MD   15 mg at 02/25/17 1244  . multivitamin with minerals tablet 1 tablet  1 tablet Oral Daily Max Sane, MD   1 tablet at 02/25/17 1236  . pantoprazole (PROTONIX) EC tablet 40 mg  40 mg Oral Daily Max Sane, MD   40 mg at 02/25/17 1236  . pregabalin (LYRICA) capsule 150 mg  150 mg Oral BID Max Sane, MD   150 mg at 02/25/17 1244  . sodium chloride flush (NS) 0.9 % injection 3 mL  3 mL Intravenous Q12H Vaughan Basta, MD   3 mL at 02/25/17 1246  . ticagrelor (BRILINTA) tablet 90 mg  90 mg Oral BID Max Sane, MD   90 mg at 02/25/17 1244  . traZODone (DESYREL) tablet 100 mg  100 mg Oral QHS Max Sane, MD   100 mg at 02/24/17 2227  . venlafaxine XR (EFFEXOR-XR) 24 hr capsule 150 mg  150 mg Oral Q breakfast Max Sane, MD   150 mg at 02/25/17 1237  . [START ON 02/28/2017] Vitamin D (Ergocalciferol) (DRISDOL) capsule 50,000 Units  50,000 Units Oral Q7 days Max Sane, MD        Musculoskeletal: Strength & Muscle Tone: decreased Gait & Station: unsteady Patient leans: N/A  Psychiatric Specialty Exam: Physical Exam  Nursing note and vitals reviewed. Constitutional: She appears well-developed and well-nourished.  HENT:  Head: Normocephalic and atraumatic.  Eyes: Conjunctivae are normal. Pupils  are equal, round, and reactive to light.  Neck: Normal range of motion.  Cardiovascular: Regular rhythm and normal heart sounds.   Respiratory: Effort normal. No respiratory distress.  GI: Soft.  Musculoskeletal: Normal range of motion.  Neurological: She is alert.  Skin: Skin is warm and dry.  Psychiatric: She has a normal mood and affect. Her speech is normal and behavior is normal. Judgment normal. Thought content is not paranoid. She does not express inappropriate judgment. She does not exhibit a depressed mood. She expresses no homicidal and no suicidal ideation. She exhibits abnormal recent memory.    Review of Systems  Constitutional: Negative.   HENT: Negative.   Eyes: Negative.   Respiratory: Negative.   Cardiovascular: Negative.   Gastrointestinal: Negative.   Musculoskeletal: Negative.   Skin: Negative.   Neurological: Negative.   Psychiatric/Behavioral: Negative for depression, hallucinations, memory loss, substance abuse and suicidal ideas. The patient is nervous/anxious. The patient does not have insomnia.     Blood pressure (!) 135/57, pulse 75, temperature 98 F (36.7 C), temperature source Oral, resp. rate 18, height '5\' 6"'  (1.676 m), weight 85.7 kg (188 lb 14.4 oz), SpO2 98 %.Body mass index is 30.49 kg/m.  General Appearance: Casual  Eye Contact:  Fair  Speech:  Slow  Volume:  Normal  Mood:  Euthymic  Affect:  Appropriate  Thought Process:  Goal Directed  Orientation:  Full (Time, Place, and Person)  Thought Content:  Logical  Suicidal Thoughts:  No  Homicidal Thoughts:  No  Memory:  Immediate;   Good Recent;   Fair Remote;   Poor  Judgement:  Fair  Insight:  Shallow  Psychomotor Activity:  Normal  Concentration:  Concentration: Fair  Recall:  [place] of Knowledge:  Fair  Language:  Fair  Akathisia:  No  Handed:  Right  AIMS (if indicated):     Assets:  Desire for Improvement Housing Resilience  ADL's:  Intact  Cognition:  WNL  Sleep:         Treatment Plan Summary: Daily contact with patient to assess and evaluate symptoms and progress in treatment, Medication management and Plan It is certainly possible that the patient could have intentionally overdosed and either be misrepresenting at or not remember the situation. She was probably upset and may have impulsively made that decision. At the same time I tend to believe her that she is not currently having thoughts of harming herself. I think this patient is probably at elevated risk chronically for impulsive behavior especially with the multiple sedatives that she takes. I once again educated her about the dangers of the high doses on the combination of all those drugs and encouraged her to talk with her providers about cutting down on some of those medicines. Patient does not require psychiatric inpatient treatment. No change to medicine. If she is still here tomorrow I will check up again.  Disposition: Patient does not meet criteria for psychiatric inpatient admission. Supportive therapy provided about ongoing stressors.  Alethia Berthold, MD 02/25/2017 4:55 PM

## 2017-02-25 NOTE — [place] FL2 (Signed)
[place] MEDICAID FL2 LEVEL OF CARE SCREENING TOOL     IDENTIFICATION  Patient Name: Jessica DanceBarbara Cavness Birthdate: 1946/05/02 Sex: female Admission Date (Current Location): 02/23/2017  [facility] [place]   Facility and Address:  [facility], [address], [place], [place] [address]      Provider Number: [id number]  Attending Physician Name and Address:  Delfino LovettVipul Shah, MD  Relative Name and Phone Number:       Current Level of Care: Hospital Recommended Level of Care: Skilled Nursing Facility Prior Approval Number:    Date Approved/Denied:   PASRR Number:    Discharge Plan: SNF    Current Diagnoses: Patient Active Problem List   Diagnosis Date Noted  . Opioid overdose 02/23/2017  . Altered mental status 02/23/2017  . Acute colitis 01/19/2017  . Sepsis ([facility]) 01/17/2017  . Cellulitis of fourth toe of right foot 04/10/2016  . Somnolence 04/09/2016  . Dementia 04/09/2016  . Cellulitis 04/03/2016  . Hematoma of right parietal scalp 02/23/2016  . Multiple falls 02/23/2016  . Physical debility 02/23/2016  . DM2 (diabetes mellitus, type 2) (H[facility]) 02/23/2016  . Syncope and collapse 02/23/2016  . Polypharmacy 02/23/2016  . Osteomyelitis of toe (H[facility]) 01/23/2016  . Rhabdomyolysis 12/07/2015  . ARF (acute renal failure) ([facility]) 11/29/2015  . Hypotension 11/29/2015  . 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 Angina effort ([facility]) 11/07/2015  . Osteomyelitis ([facility]) 11/07/2015    Orientation RESPIRATION BLADDER Height & Weight     Self, Time, Situation, Place  Normal Incontinent Weight: 188 lb 14.4 oz (85.7 kg) Height:  5\' 6"  (167.6 cm)  BEHAVIORAL SYMPTOMS/MOOD NEUROLOGICAL BOWEL NUTRITION STATUS      Incontinent Diet (Carb Modified, Thin Liquids)  AMBULATORY STATUS COMMUNICATION OF NEEDS Skin   Limited Assist Verbally Normal                       Personal Care Assistance Level of Assistance  Feeding, Bathing, Dressing  Bathing Assistance: Limited assistance Feeding assistance: Limited assistance Dressing Assistance: Limited assistance     Functional Limitations Info  Sight, Hearing, Speech Sight Info: Adequate Hearing Info: Adequate Speech Info: Adequate    SPECIAL CARE FACTORS FREQUENCY  PT (By licensed PT)     PT Frequency: 5              Contractures Contractures Info: Not present    Additional Factors Info  Code Status, Allergies, Psychotropic, Insulin Sliding Scale Code Status Info: DNR Allergies Info: Codeine, Influenza Vaccines, Methadone, Percocet Oxycodone-acetaminophen, Tetanus Toxoids, Tetracyclines & Related Psychotropic Info: Medications:  Effexor, Remeron Insulin Sliding Scale Info: 3x/day       Current Medications (02/25/2017):  This is the current hospital active medication list Current Facility-Administered Medications  Medication Dose Route Frequency Provider Last Rate Last Dose  . 0.9 %  sodium chloride infusion   Intravenous Continuous Delfino LovettVipul Shah, MD 125 mL/hr at 02/25/17 0200    . ALPRAZolam Prudy Feeler(XANAX) tablet 1 mg  1 mg Oral TID Delfino LovettVipul Shah, MD   1 mg at 02/25/17 1237  . aspirin EC tablet 81 mg  81 mg Oral Daily Delfino LovettVipul Shah, MD   81 mg at 02/25/17 1236  . celecoxib (CELEBREX) capsule 200 mg  200 mg Oral Daily Delfino LovettVipul Shah, MD   200 mg at 02/25/17 1237  . cyclobenzaprine (FLEXERIL) tablet 10 mg  10 mg Oral TID PRN Delfino LovettVipul Shah, MD      . docusate sodium (COLACE) capsule 100  mg  100 mg Oral BID PRN Altamese Dilling, MD      . enoxaparin (LOVENOX) injection 40 mg  40 mg Subcutaneous Q24H Delfino Lovett, MD   40 mg at 02/24/17 2227  . ferrous sulfate tablet 325 mg  325 mg Oral Q breakfast Delfino Lovett, MD   325 mg at 02/25/17 1236  . fesoterodine (TOVIAZ) tablet 8 mg  8 mg Oral Daily Delfino Lovett, MD   8 mg at 02/25/17 1237  . insulin aspart (novoLOG) injection 0-9 Units  0-9 Units Subcutaneous TID WC Altamese Dilling, MD   1 Units at 02/24/17 1803  . insulin detemir (LEVEMIR)  injection 20 Units  20 Units Subcutaneous Q24H Delfino Lovett, MD   20 Units at 02/24/17 1832  . isosorbide mononitrate (IMDUR) 24 hr tablet 30 mg  30 mg Oral Daily Delfino Lovett, MD   30 mg at 02/25/17 1237  . lisinopril (PRINIVIL,ZESTRIL) tablet 10 mg  10 mg Oral Daily Delfino Lovett, MD   10 mg at 02/25/17 1237  . loratadine (CLARITIN) tablet 10 mg  10 mg Oral Daily Delfino Lovett, MD   10 mg at 02/25/17 1236  . metoprolol tartrate (LOPRESSOR) tablet 25 mg  25 mg Oral BID Delfino Lovett, MD   25 mg at 02/25/17 1237  . mirtazapine (REMERON) tablet 15 mg  15 mg Oral QHS Delfino Lovett, MD   15 mg at 02/24/17 2228  . morphine (MS CONTIN) 12 hr tablet 15 mg  15 mg Oral Q12H Vipul Shah, MD   15 mg at 02/25/17 1244  . multivitamin with minerals tablet 1 tablet  1 tablet Oral Daily Delfino Lovett, MD   1 tablet at 02/25/17 1236  . pantoprazole (PROTONIX) EC tablet 40 mg  40 mg Oral Daily Delfino Lovett, MD   40 mg at 02/25/17 1236  . pregabalin (LYRICA) capsule 150 mg  150 mg Oral BID Delfino Lovett, MD   150 mg at 02/25/17 1244  . sodium chloride flush (NS) 0.9 % injection 3 mL  3 mL Intravenous Q12H Altamese Dilling, MD   3 mL at 02/25/17 1246  . ticagrelor (BRILINTA) tablet 90 mg  90 mg Oral BID Delfino Lovett, MD   90 mg at 02/25/17 1244  . traZODone (DESYREL) tablet 100 mg  100 mg Oral QHS Delfino Lovett, MD   100 mg at 02/24/17 2227  . venlafaxine XR (EFFEXOR-XR) 24 hr capsule 150 mg  150 mg Oral Q breakfast Delfino Lovett, MD   150 mg at 02/25/17 1237  . [START ON 02/28/2017] Vitamin D (Ergocalciferol) (DRISDOL) capsule 50,000 Units  50,000 Units Oral Q7 days Delfino Lovett, MD         Discharge Medications: Please see discharge summary for a list of discharge medications.  Relevant Imaging Results:  Relevant Lab Results:   Additional Information SSN:  [id number]  Dede Query, LCSW

## 2017-02-25 NOTE — Clinical Social Work Note (Signed)
CSW spoke to [facility], [place]. CSW provided update that PT is still pending. Randi shared concern that was provided to her by the social worker, [name] at [facility], that pt's overdose may have been intentional as pt did not press her life alert button and she stated "it would be better off if I wasn't here." CSW updated attending MD of this. CSW also paged Psychiatrist. CSW will continue to follow.   Dede QuerySarah Pa Tennant, MSW, LCSW Clinical Social Worker  [phone]

## 2017-02-25 NOTE — Evaluation (Signed)
Physical Therapy Evaluation Patient Details Name: Jessica Donovan MRN: [id number] DOB: 11-Jan-1946 Today's Date: 02/25/2017   History of Present Illness  Jessica Donovan is a 71 y.o. female with a known history of chronic back pain, CAD, HTN, stroke, - fell on floor and could not get up, so called EMS. Not a very clear historian. As per ER physician, she called EMS herself. She is lethargic and minimal responsive, was hypotensive on arrival, EMS and ER gave narcaine- and had response to that- denies overdose of her pain meds. CK level is high. Pt is currently AOx3 at time of PT evaluation. She reports chronic falls at home, at least 5 over the last 12 months.  Clinical Impression  Pt admitted with above diagnosis. Pt currently with functional limitations due to the deficits listed below (see PT Problem List).  Pt initially very unsteady with first attempted transfer but she is able to perform subsequent transfers and ambulation with fair stability. She ambulates partially around RN station and is able to perform horizontal and vertical head turns with only mild changes in gait direction and speed. She presents with balance deficits in single leg and narrow stance as well as poor safety awareness. She does not demonstrate needs for SNF placement however appears to be relatively unsafe at home given 5 falls over the last 12 months. She has a [facility] [employer] Alert bracelet but does not use appropriately when she falls. [facility] her balance as well as intermittent assistance to ensure her safety. Pt will benefit from skilled PT services to address deficits in strength, balance, and mobility in order to return to full function at home.     Follow Up Recommendations Home health PT;Supervision - Intermittent    Equipment Recommendations  None recommended by PT;Other (comment) (Must use her rollator at discharge)    Recommendations for Other Services       Precautions / Restrictions  Precautions Precautions: Fall Restrictions Weight Bearing Restrictions: No      Mobility  Bed Mobility Overal bed mobility: Modified Independent             General bed mobility comments: Pt demonstrates good speed and sequencing. HOB minimally elevated and bed rail utilized  Transfers Overall transfer level: Needs assistance Equipment used: Rolling walker (2 wheeled) Transfers: Sit to/from Stand Sit to Stand: Min assist         General transfer comment: Pt demonstrates instability with transfers. During first attempt she falls backwards onto bed. During second attempt she demonstrates improved weight shifting. Once upright she is able to remain in standing with bilateral UE support on walker  Ambulation/Gait Ambulation/Gait assistance: Min guard Ambulation Distance (Feet): 180 Feet Assistive device: Rolling walker (2 wheeled) Gait Pattern/deviations: Decreased step length - right;Decreased step length - left Gait velocity: Decreased but functional for limited community mobility   General Gait Details: Pt able to ambulate partially around RN station with rolling walker. She is able to progress to ambulation with hand held assist only. She is able to perform horizontal and vertical head turns with only minimal lateral deviation and gait speed slowing. Vitals monitored and remain WNL throughout ambulation.   Stairs            Wheelchair Mobility    Modified Rankin (Stroke Patients Only)       Balance Overall balance assessment: Needs assistance Sitting-balance support: No upper extremity supported Sitting balance-Leahy Scale: Good     Standing balance support: No upper  extremity supported Standing balance-Leahy Scale: Fair Standing balance comment: Able to maintain feet apart and feet together without falling. However single leg balance is approximately 1 second                             Pertinent Vitals/Pain Pain Assessment: 0-10 Pain  Score: 6  Pain Location: Mid back and L posterior hip, chronic prior to admission Pain Descriptors / Indicators: Aching Pain Intervention(s): Monitored during session    Home Living Family/patient expects to be discharged to:: Private residence Living Arrangements: Alone Available Help at Discharge: Friend(s) Type of Home: Apartment Home Access: Ramped entrance     Home Layout: One level Home Equipment: Cane - single point;Walker - 4 wheels;Shower seat;Grab bars - tub/shower      Prior Function Level of Independence: Independent with assistive device(s)         Comments: Independent with ADLs/IADLs. Rides in apartment [place] for transportation. Pt reports approximately 5 falls over the last 12 months. Uses rollator for community mobility. No assistive device in apartment     Hand Dominance   Dominant Hand: Right    Extremity/Trunk Assessment   Upper Extremity Assessment Upper Extremity Assessment: Overall WFL for tasks assessed    Lower Extremity Assessment Lower Extremity Assessment: Overall WFL for tasks assessed       Communication   Communication: No difficulties  Cognition Arousal/Alertness: Awake/alert Behavior During Therapy: WFL for tasks assessed/performed Overall Cognitive Status: Within Functional Limits for tasks assessed                      General Comments      Exercises     Assessment/Plan    PT Assessment Patient needs continued PT services  PT Problem List Decreased activity tolerance;Decreased balance;Decreased mobility;Decreased knowledge of use of DME;Decreased safety awareness       PT Treatment Interventions DME instruction;Gait training;Functional mobility training;Therapeutic activities;Stair training;Therapeutic exercise;Balance training;Cognitive remediation;Neuromuscular re-education;Patient/family education    PT Goals (Current goals can be found in the Care Plan section)  Acute Rehab PT Goals Patient Stated Goal:  Return to prior level of function PT Goal Formulation: With patient Time For Goal Achievement: 03/11/17 Potential to Achieve Goals: Fair    Frequency Min 2X/week   Barriers to discharge Decreased caregiver support Pt lives alone    Co-evaluation               End of Session Equipment Utilized During Treatment: Gait belt Activity Tolerance: Patient tolerated treatment well Patient left: in bed;with call bell/phone within reach;with bed alarm set   PT Visit Diagnosis: Unsteadiness on feet (R26.81);Repeated falls (R29.6)         Time: 1610-9604 PT Time Calculation (min) (ACUTE ONLY): 25 min   Charges:   PT Evaluation $PT Eval Low Complexity: 1 Procedure PT Treatments $Gait Training: 8-22 mins   PT G Codes:        Sharalyn Ink Helena Sardo PT, DPT   Javonte Elenes 02/25/2017, 1:16 PM

## 2017-02-25 NOTE — Progress Notes (Signed)
[employer] - [place] at [facility]   PATIENT NAME: Jessica Donovan    MR#:  [id number]  DATE OF BIRTH:  1946-10-22  SUBJECTIVE:  CHIEF COMPLAINT:   Chief Complaint  Patient presents with  . Fall  She opened up and had a long discussion with her at bedside.. She certainly seem quite upset with her providers as an outpatient -lot of it revolves around her chronic pain management issues.  She also is dealing with issues with her home settings and her dog is her neighbors are having issues with the dog barking. REVIEW OF SYSTEMS:  Review of Systems  Constitutional: Positive for malaise/fatigue. Negative for chills, fever and weight loss.  HENT: Negative for nosebleeds and sore throat.   Eyes: Negative for blurred vision.  Respiratory: Negative for cough, shortness of breath and wheezing.   Cardiovascular: Negative for chest pain, orthopnea, leg swelling and PND.  Gastrointestinal: Negative for abdominal pain, constipation, diarrhea, heartburn, nausea and vomiting.  Genitourinary: Negative for dysuria and urgency.  Musculoskeletal: Negative for back pain.  Skin: Negative for rash.  Neurological: Positive for weakness. Negative for dizziness, speech change, focal weakness and headaches.  Endo/Heme/Allergies: Does not bruise/bleed easily.  Psychiatric/Behavioral: Positive for depression.   DRUG ALLERGIES:   Allergies  Allergen Reactions  . Codeine Nausea And Vomiting  . Influenza Vaccines Other (See Comments)    Reaction:  Caused pt to pass out   . Methadone Hives and Itching  . Percocet [Oxycodone-Acetaminophen] Nausea And Vomiting  . Tetanus Toxoids Swelling and Other (See Comments)    Reaction:  Swelling at injection site  . Tetracyclines & Related Rash   VITALS:  Blood pressure (!) 152/66, pulse 63, temperature 97.5 F (36.4 C), temperature source Oral, resp. rate 19, height 5\' 6"  (1.676 m), weight 85.7 kg (188 lb 14.4 oz), SpO2 98 %. PHYSICAL EXAMINATION:    Physical Exam  Constitutional: She is oriented to person, place, and time and well-developed, well-nourished, and in no distress.  HENT:  Head: Normocephalic and atraumatic.  Eyes: Conjunctivae and EOM are normal. Pupils are equal, round, and reactive to light.  Neck: Normal range of motion. Neck supple. No tracheal deviation present. No thyromegaly present.  Cardiovascular: Normal rate, regular rhythm and normal heart sounds.   Pulmonary/Chest: Effort normal and breath sounds normal. No respiratory distress. She has no wheezes. She exhibits no tenderness.  Abdominal: Soft. Bowel sounds are normal. She exhibits no distension. There is no tenderness.  Musculoskeletal: Normal range of motion.  Neurological: She is alert and oriented to person, place, and time. No cranial nerve deficit.  Skin: Skin is warm and dry. Bruising and ecchymosis noted. No rash noted.  On face and around eyes  Psychiatric: Her affect is inappropriate. She exhibits a depressed mood.   LABORATORY PANEL:  Female CBC  Recent Labs Lab 02/25/17 0420  WBC 5.2  HGB 12.1  HCT 36.9  PLT 225   ------------------------------------------------------------------------------------------------------------------ Chemistries   Recent Labs Lab 02/23/17 1059  02/25/17 0420  NA 136  < > 139  K 4.8  < > 3.4*  CL 100*  < > 109  CO2 28  < > 26  GLUCOSE 154*  < > 82  BUN 29*  < > 10  CREATININE 1.12*  < > 0.47  CALCIUM 8.7*  < > 9.0  AST 57*  --   --   ALT 15  --   --   ALKPHOS 51  --   --  BILITOT 0.4  --   --   < > = values in this interval not displayed. RADIOLOGY:  No results found. ASSESSMENT AND PLAN:  71 y.o. female with a known history of Ch back pain, CAD, Htn, OM, Stroke, - fell on floor and could not get up, so called EMS. Not a very clear historian  *Acute metabolic encephalopathy: Likely multifactorial - Opioid overdose/abuse, Fall, Rhabdomyolysis  - psychiatry evaluation -Mental status seems quite  close to her baseline now   * rhabdomyolysis -Continue normal saline at 125 cc an hour -CK coming down from 2000 -> 1999-> 700  *Hypertension -Continue metoprolol and lisinopril. -Restart Imdur 30 mg once daily.  * ARF - prerenal - improving with ivf  * Depression - psych input appreciated.  Patient will need reevaluation today - Continue Remeron, Effexor, trazodone  * Chronic pain. - resume MS-Contin and celebrex -This seems to be her biggest challenge.  She does seem to have been fired from several different practices and [place] [facility]  *Hypokalemia -Replete and recheck     All the records are reviewed and case discussed with Care Management/Social Worker. -I just found out patient now has been evicted from her current residential facility. she may not be able to get her dog back which may upset her.  Per social worker she will need physical therapy evaluation for skilled nursing facility placement. please note patient refused evaluation yesterday.  Overall this seems to be difficult disposition. medically she should be okay for discharge tomorrow.   Management plans discussed with the patient, family and they are in agreement.  CODE STATUS: DNR  TOTAL TIME TAKING CARE OF THIS PATIENT: 35 minutes.   More than 50% of the time was spent in counseling/coordination of care: YES  POSSIBLE D/C IN 1-2 DAYS, DEPENDING ON CLINICAL CONDITION.  And placement   Delfino Lovett M.D on 02/25/2017 at 10:30 AM  Between 7am to 6pm - Pager - [phone]  After 6pm go to [email or web address] - Social research officer, government  [facility]  Office  [phone]  CC: Primary care physician; Lauro Regulus., MD  Note: This dictation was prepared with Dragon dictation along with smaller phrase technology. Any transcriptional errors that result from this process are unintentional.

## 2017-02-26 LAB — GLUCOSE, CAPILLARY
GLUCOSE-CAPILLARY: 96 mg/dL (ref 65–99)
GLUCOSE-CAPILLARY: 119 mg/dL — AB (ref 65–99)
GLUCOSE-CAPILLARY: 159 mg/dL — AB (ref 65–99)
Glucose-Capillary: 74 mg/dL (ref 65–99)
Glucose-Capillary: 110 mg/dL — ABNORMAL HIGH (ref 65–99)

## 2017-02-26 LAB — CK
Total CK: 439 U/L — ABNORMAL HIGH (ref 38–234)
Total CK: 322 U/L — ABNORMAL HIGH (ref 38–234)

## 2017-02-26 LAB — CBC
HEMATOCRIT: 34.7 % — AB (ref 35.0–47.0)
Hemoglobin: 11.6 g/dL — ABNORMAL LOW (ref 12.0–16.0)
MCH: 33.4 pg (ref 26.0–34.0)
MCHC: 33.3 g/dL (ref 32.0–36.0)
MCV: 100.4 fL — AB (ref 80.0–100.0)
Platelets: 215 10*3/uL (ref 150–440)
RBC: 3.46 MIL/uL — ABNORMAL LOW (ref 3.80–5.20)
RDW: 14 % (ref 11.5–14.5)
WBC: 5.8 10*3/uL (ref 3.6–11.0)

## 2017-02-26 LAB — BASIC METABOLIC PANEL
ANION GAP: 6 (ref 5–15)
BUN: 8 mg/dL (ref 6–20)
CALCIUM: 8.9 mg/dL (ref 8.9–10.3)
CO2: 23 mmol/L (ref 22–32)
Chloride: 113 mmol/L — ABNORMAL HIGH (ref 101–111)
Creatinine, Ser: 0.64 mg/dL (ref 0.44–1.00)
GFR calc Af Amer: >60 mL/min (ref >60)
GFR calc non Af Amer: >60 mL/min (ref >60)
GLUCOSE: 69 mg/dL (ref 65–99)
POTASSIUM: 3.6 mmol/L (ref 3.5–5.1)
Sodium: 142 mmol/L (ref 135–145)

## 2017-02-26 NOTE — Progress Notes (Signed)
Physical Therapy Treatment Patient Details Name: Jessica DanceBarbara Donovan MRN: [id number] DOB: [birth year] Today's Date: 02/26/2017    History of Present Illness Jessica DanceBarbara Donovan is a 71 y.o. female with a known history of chronic back pain, CAD, HTN, stroke, - fell on floor and could not get up, so called EMS. Not a very clear historian. As per ER physician, she called EMS herself. She is lethargic and minimal responsive, was hypotensive on arrival, EMS and ER gave narcaine- and had response to that- denies overdose of her pain meds. CK level is high. Pt is currently AOx3 at time of PT evaluation. She reports chronic falls at home, at least 5 over the last 12 months.    PT Comments    Pt demonstrates improved stability today compared to initial evaluation. Pt able to ambulate a full lap around RN station on this date. VSS throughout ambulation. Pt denies DOE and no signs of respiratory distress. She is able to perform horizontal and vertical head turns without gait deviations with bilateral UE support on rolling walker. Able to complete seated exercises as instructed. Pt demonstrates improves stability today compared to yesterday. Primary limitation at this time is related to her insight and judgement. [facility] to improve her balance. Pt will benefit from skilled PT services to address deficits in strength, balance, and mobility in order to return to full function at home.     Follow Up Recommendations  Home health PT;Supervision - Intermittent;Other (comment) (Poor insight and judgement)     Equipment Recommendations  None recommended by PT;Other (comment) (Must use her rollator at discharge)    Recommendations for Other Services       Precautions / Restrictions Precautions Precautions: Fall Restrictions Weight Bearing Restrictions: No    Mobility  Bed Mobility Overal bed mobility: Modified Independent             General bed mobility comments: Pt demonstrates  good speed and sequencing. HOB minimally elevated and bed rail utilized  Transfers Overall transfer level: Needs assistance Equipment used: Rolling walker (2 wheeled) Transfers: Sit to/from Stand Sit to Stand: Min assist         General transfer comment: Pt demonstrates improved stability with transfers on this date. No posterior LOB during any transfers. Once upright she is steady and stable with UE support on rolling walker.   Ambulation/Gait Ambulation/Gait assistance: Min guard Ambulation Distance (Feet): 200 Feet Assistive device: Rolling walker (2 wheeled) Gait Pattern/deviations: Decreased step length - right;Decreased step length - left Gait velocity: Decreased but functional for limited community mobility   General Gait Details: Pt able to ambulate a full lap around RN station on this date. VSS throughout ambulation. Pt denies DOE and no signs of respiratory distress. She is able to perform horizontal and vertical head turns without gait deviations with bilateral UE support on rolling walker   Stairs            Wheelchair Mobility    Modified Rankin (Stroke Patients Only)       Balance Overall balance assessment: Needs assistance Sitting-balance support: No upper extremity supported Sitting balance-Leahy Scale: Good     Standing balance support: No upper extremity supported Standing balance-Leahy Scale: Fair Standing balance comment: Able to maintain feet apart and feet together without falling. However single leg balance is approximately 1 second                    Cognition Arousal/Alertness: Awake/alert Behavior During Therapy:  WFL for tasks assessed/performed Overall Cognitive Status: Within Functional Limits for tasks assessed                      Exercises General Exercises - Lower Extremity Long Arc Quad: Strengthening;Both;15 reps;Seated Heel Slides: Strengthening;Both;15 reps;Seated Hip ABduction/ADduction:  Strengthening;Both;15 reps;Seated Hip Flexion/Marching: Strengthening;Both;15 reps;Seated Heel Raises: Strengthening;Both;15 reps;Seated    General Comments        Pertinent Vitals/Pain Pain Assessment: 0-10 Pain Score: 7  Pain Location: Mid back and L posterior hip, chronic prior to admission Pain Descriptors / Indicators: Aching Pain Intervention(s): Monitored during session    Home Living                      Prior Function            PT Goals (current goals can now be found in the care plan section) Acute Rehab PT Goals Patient Stated Goal: Return to prior level of function PT Goal Formulation: With patient Time For Goal Achievement: 03/11/17 Potential to Achieve Goals: Fair Progress towards PT goals: Progressing toward goals    Frequency    Min 2X/week      PT Plan Current plan remains appropriate    Co-evaluation             End of Session Equipment Utilized During Treatment: Gait belt Activity Tolerance: Patient tolerated treatment well Patient left: in bed;with call bell/phone within reach;with bed alarm set Nurse Communication: Mobility status;Other (comment) (RN observed mobility) PT Visit Diagnosis: Unsteadiness on feet (R26.81);Repeated falls (R29.6)     Time: 1610-9604 PT Time Calculation (min) (ACUTE ONLY): 25 min  Charges:  $Gait Training: 8-22 mins $Therapeutic Exercise: 8-22 mins                    G Codes:      Sharalyn Ink Lacole Komorowski PT, DPT   Virgilene Stryker 02/26/2017, 12:06 PM

## 2017-02-26 NOTE — Consult Note (Signed)
[facility]iatry Consult   Reason for Consult:  Consult for 71 year old woman with presentation with altered mental status Referring Physician:  Manuella Ghazi Patient Identification: Jeanetta Alonzo MRN:  [id number] Principal Diagnosis: Opioid overdose Diagnosis:   Patient Active Problem List   Diagnosis Date Noted  . Opioid overdose [T40.2X1A] 02/23/2017  . Altered mental status [R41.82] 02/23/2017  . Acute colitis [K52.9] 01/19/2017  . Sepsis ([place]) [A41.9] 01/17/2017  . Cellulitis of fourth toe of right foot [L03.031] 04/10/2016  . Somnolence [R40.0] 04/09/2016  . Dementia [F03.90] 04/09/2016  . Cellulitis [L03.90] 04/03/2016  . Hematoma of right parietal scalp [S00.03XA] 02/23/2016  . Multiple falls [R29.6] 02/23/2016  . Physical debility [R53.81] 02/23/2016  . DM2 (diabetes mellitus, type 2) ([place]) [E11.9] 02/23/2016  . Syncope and collapse [R55] 02/23/2016  . Polypharmacy [Z79.899] 02/23/2016  . Osteomyelitis of toe ([place]) [M86.9] 01/23/2016  . Rhabdomyolysis [M62.82] 12/07/2015  . ARF (acute renal failure) ([place]) [N17.9] 11/29/2015  . Hypotension [I95.9] 11/29/2015  . Diabetic osteomyelitis ([place]) [E11.69, M86.9] 11/07/2015  . Angina effort ([place]) [I20.8] 11/07/2015  . Osteomyelitis ([place]) [M86.9] 11/07/2015    Total Time spent with patient: 20 minutes  Subjective:   Oza Oberle is a 71 y.o. female patient admitted with "I guess I fell down".  Follow-up for Friday the 19th. Patient seen chart reviewed. Patient reports that she is feeling even better today. Denies depression. Says that she has been having good conversations with the people at her apartment complex and she is optimistic about things working out as far as her dog. Patient was smiling and upbeat and denied any suicidal ideation at all. She says that she is agreeable with the plan to go to rehabilitation immediately after discharge.  HPI:  Patient interviewed. Chart reviewed. Known from previous encounters.  71 year old woman presented to the emergency room with altered mental status. The way she tells the story to me some people at her apartment building found her having fallen down on the ground. It says in the emergency room notes that she called 911 herself. Patient says the last thing she remembers now is going about her usual routine at bedtime. She denies absolutely that she took any extra doses of any of her medicines or did anything to try to harm herself. She has no explanation really for why she would've fallen down although she does say that she has not been eating well recently and has not been sleeping well. Patient has been under quite a bit of stress in the last week. She tells me that the manager at her apartment building told her that she had to get rid of her dog. The dog is now staying with a friend. Patient is very distraught over this. She starts crying and clearly is upset although she didn't again denies any suicidal thoughts. Admits that she is not eating or sleeping very well. Denies any psychotic symptoms. Patient as usual shows no insight or willingness to consider that all of the prescription sedative she takes could be a problem.  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 Chronic pain and is on chronic narcotic treatment.  Social history: Patient is a widow. Lives independently in an apartment at [place]. Her [place] substance abuse history: Denies any history of alcohol or drug abuse. She has had a previous visit to our hospital also with what appeared to be an overdose of medicine although it's not clear that any of these were intentional. She is on chronic high doses of benzodiazepines and  narcotics and other sedatives and is at chronic risk of interactions and problems from them.  Past Psychiatric History: Patient was treated for depression after the death of her husband. Denies any history of suicide attempts. Has been on venlafaxine and Xanax chronically.  Denies any history of psychiatric hospitalization. Denies any history of suicide attempts.  Risk to Self: Is patient at risk for suicide?: No Risk to Others:   Prior Inpatient Therapy:   Prior Outpatient Therapy:    Past Medical History:  Past Medical History:  Diagnosis Date  . Chronic back pain   . Collagen vascular disease ([place])   . Coronary artery disease   . Diabetes mellitus without complication ([place])   . Hypertension   . Low back pain   . Myocardial infarction   . Osteomyelitis of toe ([place]) 01/23/2016  . Stroke [facility])     Past Surgical History:  Procedure Laterality Date  . AMPUTATION TOE Left 11/10/2015   Procedure: AMPUTATION TOE;  Surgeon: Sharlotte Alamo, MD;  Location: [facility] ORS;  Service: Podiatry;  Laterality: Left;  . AMPUTATION TOE Left 01/24/2016   Procedure: AMPUTATION TOE (2nd mpj);  Surgeon: Sharlotte Alamo, DPM;  Location: [facility] ORS;  Service: Podiatry;  Laterality: Left;  . CARDIAC CATHETERIZATION N/A 11/12/2015   Procedure: Left Heart Cath and Coronary Angiography;  Surgeon: Isaias Cowman, MD;  Location: [facility] CV LAB;  Service: Cardiovascular;  Laterality: N/A;  . TOE AMPUTATION     Family History:  Family History  Problem Relation Age of Onset  . CAD Mother   . CAD Father    Family Psychiatric  History: Denies any Social History:  History  Alcohol Use No     History  Drug Use No    Social History   Social History  . Marital status: Widowed    Spouse name: N/A  . Number of children: N/A  . Years of education: N/A   Social History Main Topics  . Smoking status: Never Smoker  . Smokeless tobacco: Never Used  . Alcohol use No  . Drug use: No  . Sexual activity: Not Currently   Other Topics Concern  . None   Social History Narrative  . None   Additional Social History:    Allergies:   Allergies  Allergen Reactions  . Codeine Nausea And Vomiting  . Influenza Vaccines Other (See Comments)    Reaction:  Caused pt to pass out   .  Methadone Hives and Itching  . Percocet [Oxycodone-Acetaminophen] Nausea And Vomiting  . Tetanus Toxoids Swelling and Other (See Comments)    Reaction:  Swelling at injection site  . Tetracyclines & Related Rash    Labs:  Results for orders placed or performed during the hospital encounter of 02/23/17 (from the past 48 hour(s))  Glucose, capillary     Status: Abnormal   Collection Time: 02/24/17  8:28 PM  Result Value Ref Range   Glucose-Capillary 155 (H) 65 - 99 mg/dL  CK     Status: Abnormal   Collection Time: 02/24/17  8:55 PM  Result Value Ref Range   Total CK 1,003 (H) 38 - 234 U/L    Comment: HEMOLYSIS AT THIS LEVEL MAY AFFECT RESULT  CK     Status: Abnormal   Collection Time: 02/25/17  4:20 AM  Result Value Ref Range   Total CK 819 (H) 38 - 234 U/L  CBC     Status: Abnormal   Collection Time: 02/25/17  4:20 AM  Result  Value Ref Range   WBC 5.2 3.6 - 11.0 K/uL   RBC 3.66 (L) 3.80 - 5.20 MIL/uL   Hemoglobin 12.1 12.0 - 16.0 g/dL   HCT 36.9 35.0 - 47.0 %   MCV 101.0 (H) 80.0 - 100.0 fL   MCH 33.0 26.0 - 34.0 pg   MCHC 32.6 32.0 - 36.0 g/dL   RDW 14.2 11.5 - 14.5 %   Platelets 225 150 - 440 K/uL  Basic metabolic panel     Status: Abnormal   Collection Time: 02/25/17  4:20 AM  Result Value Ref Range   Sodium 139 135 - 145 mmol/L   Potassium 3.4 (L) 3.5 - 5.1 mmol/L   Chloride 109 101 - 111 mmol/L   CO2 26 22 - 32 mmol/L   Glucose, Bld 82 65 - 99 mg/dL   BUN 10 6 - 20 mg/dL   Creatinine, Ser 0.47 0.44 - 1.00 mg/dL   Calcium 9.0 8.9 - 10.3 mg/dL   GFR calc non Af Amer >60 >60 mL/min   GFR calc Af Amer >60 >60 mL/min    Comment: (NOTE) The eGFR has been calculated using the CKD EPI equation. This calculation has not been validated in all clinical situations. eGFR's persistently <60 mL/min signify possible Chronic Kidney Disease.    Anion gap 4 (L) 5 - 15  Glucose, capillary     Status: None   Collection Time: 02/25/17  8:11 AM  Result Value Ref Range    Glucose-Capillary 74 65 - 99 mg/dL  CK     Status: Abnormal   Collection Time: 02/25/17  8:58 AM  Result Value Ref Range   Total CK 707 (H) 38 - 234 U/L  Glucose, capillary     Status: Abnormal   Collection Time: 02/25/17 12:01 PM  Result Value Ref Range   Glucose-Capillary 117 (H) 65 - 99 mg/dL  Glucose, capillary     Status: Abnormal   Collection Time: 02/25/17  4:16 PM  Result Value Ref Range   Glucose-Capillary 117 (H) 65 - 99 mg/dL  CK     Status: Abnormal   Collection Time: 02/25/17  4:54 PM  Result Value Ref Range   Total CK 598 (H) 38 - 234 U/L  Glucose, capillary     Status: Abnormal   Collection Time: 02/25/17  8:51 PM  Result Value Ref Range   Glucose-Capillary 124 (H) 65 - 99 mg/dL  CK     Status: Abnormal   Collection Time: 02/26/17  5:03 AM  Result Value Ref Range   Total CK 439 (H) 38 - 234 U/L  CBC     Status: Abnormal   Collection Time: 02/26/17  5:03 AM  Result Value Ref Range   WBC 5.8 3.6 - 11.0 K/uL   RBC 3.46 (L) 3.80 - 5.20 MIL/uL   Hemoglobin 11.6 (L) 12.0 - 16.0 g/dL   HCT 34.7 (L) 35.0 - 47.0 %   MCV 100.4 (H) 80.0 - 100.0 fL   MCH 33.4 26.0 - 34.0 pg   MCHC 33.3 32.0 - 36.0 g/dL   RDW 14.0 11.5 - 14.5 %   Platelets 215 150 - 440 K/uL  Basic metabolic panel     Status: Abnormal   Collection Time: 02/26/17  5:03 AM  Result Value Ref Range   Sodium 142 135 - 145 mmol/L   Potassium 3.6 3.5 - 5.1 mmol/L   Chloride 113 (H) 101 - 111 mmol/L   CO2 23 22 - 32 mmol/L  Glucose, Bld 69 65 - 99 mg/dL   BUN 8 6 - 20 mg/dL   Creatinine, Ser 0.64 0.44 - 1.00 mg/dL   Calcium 8.9 8.9 - 10.3 mg/dL   GFR calc non Af Amer >60 >60 mL/min   GFR calc Af Amer >60 >60 mL/min    Comment: (NOTE) The eGFR has been calculated using the CKD EPI equation. This calculation has not been validated in all clinical situations. eGFR's persistently <60 mL/min signify possible Chronic Kidney Disease.    Anion gap 6 5 - 15  Glucose, capillary     Status: None   Collection  Time: 02/26/17  7:50 AM  Result Value Ref Range   Glucose-Capillary 74 65 - 99 mg/dL  Glucose, capillary     Status: None   Collection Time: 02/26/17  8:10 AM  Result Value Ref Range   Glucose-Capillary 96 65 - 99 mg/dL  Glucose, capillary     Status: Abnormal   Collection Time: 02/26/17 12:06 PM  Result Value Ref Range   Glucose-Capillary 119 (H) 65 - 99 mg/dL  Glucose, capillary     Status: Abnormal   Collection Time: 02/26/17  4:47 PM  Result Value Ref Range   Glucose-Capillary 110 (H) 65 - 99 mg/dL   Comment 1 Notify RN   CK     Status: Abnormal   Collection Time: 02/26/17  4:54 PM  Result Value Ref Range   Total CK 322 (H) 38 - 234 U/L    Current Facility-Administered Medications  Medication Dose Route Frequency Provider Last Rate Last Dose  . ALPRAZolam Duanne Moron) tablet 1 mg  1 mg Oral TID Max Sane, MD   1 mg at 02/26/17 1711  . aspirin EC tablet 81 mg  81 mg Oral Daily Max Sane, MD   81 mg at 02/26/17 8616  . celecoxib (CELEBREX) capsule 200 mg  200 mg Oral Daily Max Sane, MD   200 mg at 02/26/17 0918  . cyclobenzaprine (FLEXERIL) tablet 10 mg  10 mg Oral TID PRN Max Sane, MD   10 mg at 02/26/17 1711  . docusate sodium (COLACE) capsule 100 mg  100 mg Oral BID PRN Vaughan Basta, MD      . enoxaparin (LOVENOX) injection 40 mg  40 mg Subcutaneous Q24H Max Sane, MD   40 mg at 02/25/17 2113  . ferrous sulfate tablet 325 mg  325 mg Oral Q breakfast Max Sane, MD   325 mg at 02/26/17 0937  . fesoterodine (TOVIAZ) tablet 8 mg  8 mg Oral Daily Max Sane, MD   8 mg at 02/26/17 0917  . insulin aspart (novoLOG) injection 0-9 Units  0-9 Units Subcutaneous TID WC Vaughan Basta, MD   1 Units at 02/24/17 1803  . insulin detemir (LEVEMIR) injection 20 Units  20 Units Subcutaneous Q24H Max Sane, MD   20 Units at 02/26/17 1711  . isosorbide mononitrate (IMDUR) 24 hr tablet 30 mg  30 mg Oral Daily Max Sane, MD   30 mg at 02/26/17 0918  . lisinopril  (PRINIVIL,ZESTRIL) tablet 10 mg  10 mg Oral Daily Max Sane, MD   10 mg at 02/26/17 0918  . loratadine (CLARITIN) tablet 10 mg  10 mg Oral Daily Max Sane, MD   10 mg at 02/26/17 0918  . metoprolol tartrate (LOPRESSOR) tablet 25 mg  25 mg Oral BID Max Sane, MD   25 mg at 02/26/17 0918  . mirtazapine (REMERON) tablet 15 mg  15 mg Oral QHS Vipul  Manuella Ghazi, MD   15 mg at 02/25/17 2113  . morphine (MS CONTIN) 12 hr tablet 15 mg  15 mg Oral Q12H Vipul Shah, MD   15 mg at 02/26/17 2707  . multivitamin with minerals tablet 1 tablet  1 tablet Oral Daily Max Sane, MD   1 tablet at 02/26/17 0917  . pantoprazole (PROTONIX) EC tablet 40 mg  40 mg Oral Daily Max Sane, MD   40 mg at 02/26/17 0918  . pregabalin (LYRICA) capsule 150 mg  150 mg Oral BID Max Sane, MD   150 mg at 02/26/17 0918  . sodium chloride flush (NS) 0.9 % injection 3 mL  3 mL Intravenous Q12H Vaughan Basta, MD   3 mL at 02/26/17 0939  . ticagrelor (BRILINTA) tablet 90 mg  90 mg Oral BID Max Sane, MD   90 mg at 02/26/17 0918  . traZODone (DESYREL) tablet 100 mg  100 mg Oral QHS Max Sane, MD   100 mg at 02/25/17 2112  . venlafaxine XR (EFFEXOR-XR) 24 hr capsule 150 mg  150 mg Oral Q breakfast Max Sane, MD   150 mg at 02/26/17 0937  . [START ON 02/28/2017] Vitamin D (Ergocalciferol) (DRISDOL) capsule 50,000 Units  50,000 Units Oral Q7 days Max Sane, MD        Musculoskeletal: Strength & Muscle Tone: decreased Gait & Station: unsteady Patient leans: N/A  Psychiatric Specialty Exam: Physical Exam  Nursing note and vitals reviewed. Constitutional: She appears well-developed and well-nourished.  HENT:  Head: Normocephalic and atraumatic.  Eyes: Conjunctivae are normal. Pupils are equal, round, and reactive to light.  Neck: Normal range of motion.  Cardiovascular: Regular rhythm and normal heart sounds.   Respiratory: Effort normal. No respiratory distress.  GI: Soft.  Musculoskeletal: Normal range of motion.   Neurological: She is alert.  Skin: Skin is warm and dry.  Psychiatric: She has a normal mood and affect. Her speech is normal and behavior is normal. Judgment and thought content normal. Thought content is not paranoid. She does not express inappropriate judgment. She does not exhibit a depressed mood. She expresses no homicidal and no suicidal ideation. She exhibits abnormal recent memory.    Review of Systems  Constitutional: Negative.   HENT: Negative.   Eyes: Negative.   Respiratory: Negative.   Cardiovascular: Negative.   Gastrointestinal: Negative.   Musculoskeletal: Negative.   Skin: Negative.   Neurological: Negative.   Psychiatric/Behavioral: Negative.  Negative for depression, hallucinations, memory loss, substance abuse and suicidal ideas. The patient does not have insomnia.     Blood pressure 135/66, pulse 78, temperature 98.3 F (36.8 C), temperature source Oral, resp. rate 20, height 5' 6" (1.676 m), weight 85.7 kg (188 lb 14.4 oz), SpO2 97 %.Body mass index is 30.49 kg/m.  General Appearance: Casual  Eye Contact:  Fair  Speech:  Slow  Volume:  Normal  Mood:  Euthymic  Affect:  Appropriate  Thought Process:  Goal Directed  Orientation:  Full (Time, Place, and Person)  Thought Content:  Logical  Suicidal Thoughts:  No  Homicidal Thoughts:  No  Memory:  Immediate;   Good Recent;   Fair Remote;   Poor  Judgement:  Fair  Insight:  Shallow  Psychomotor Activity:  Normal  Concentration:  Concentration: Fair  Recall:  [employer] of Knowledge:  Fair  Language:  Fair  Akathisia:  No  Handed:  Right  AIMS (if indicated):     Assets:  Desire for Improvement Housing  Resilience  ADL's:  Intact  Cognition:  WNL  Sleep:        Treatment Plan Summary: Daily contact with patient to assess and evaluate symptoms and progress in treatment, Medication management and Plan Patient does not appear to be having active depression and there is no sign of suicidality or  dangerousness. Patient think still will need to be watched into the future to not overuse or misuse excessive sedating medicine. For now however no further changed psychiatric medicine. Supportive counseling completed. I understand patient may be here through the weekend. If psychiatric services are required please call the doctor on call.  Disposition: Patient does not meet criteria for psychiatric inpatient admission. Supportive therapy provided about ongoing stressors.  Alethia Berthold, MD 02/26/2017 6:31 PM

## 2017-02-26 NOTE — Progress Notes (Signed)
[employer] - [facility] at [facility]   PATIENT NAME: Jessica Donovan    MR#:  [id number]  DATE OF BIRTH:  1946/05/22  SUBJECTIVE:  CHIEF COMPLAINT:   Chief Complaint  Patient presents with  . Fall  feels much better, learned that she can't go back to her home anymore (evicted) so somewhat upset. Happy with care here ad thankful REVIEW OF SYSTEMS:  Review of Systems  Constitutional: Positive for malaise/fatigue. Negative for chills, fever and weight loss.  HENT: Negative for nosebleeds and sore throat.   Eyes: Negative for blurred vision.  Respiratory: Negative for cough, shortness of breath and wheezing.   Cardiovascular: Negative for chest pain, orthopnea, leg swelling and PND.  Gastrointestinal: Negative for abdominal pain, constipation, diarrhea, heartburn, nausea and vomiting.  Genitourinary: Negative for dysuria and urgency.  Musculoskeletal: Negative for back pain.  Skin: Negative for rash.  Neurological: Positive for weakness. Negative for dizziness, speech change, focal weakness and headaches.  Endo/Heme/Allergies: Does not bruise/bleed easily.  Psychiatric/Behavioral: Positive for depression.   DRUG ALLERGIES:   Allergies  Allergen Reactions  . Codeine Nausea And Vomiting  . Influenza Vaccines Other (See Comments)    Reaction:  Caused pt to pass out   . Methadone Hives and Itching  . Percocet [Oxycodone-Acetaminophen] Nausea And Vomiting  . Tetanus Toxoids Swelling and Other (See Comments)    Reaction:  Swelling at injection site  . Tetracyclines & Related Rash   VITALS:  Blood pressure 135/66, pulse 78, temperature 98.3 F (36.8 C), temperature source Oral, resp. rate 20, height 5\' 6"  (1.676 m), weight 85.7 kg (188 lb 14.4 oz), SpO2 97 %. PHYSICAL EXAMINATION:  Physical Exam  Constitutional: She is oriented to person, place, and time and well-developed, well-nourished, and in no distress.  HENT:  Head: Normocephalic and atraumatic.  Eyes:  Conjunctivae and EOM are normal. Pupils are equal, round, and reactive to light.  Neck: Normal range of motion. Neck supple. No tracheal deviation present. No thyromegaly present.  Cardiovascular: Normal rate, regular rhythm and normal heart sounds.   Pulmonary/Chest: Effort normal and breath sounds normal. No respiratory distress. She has no wheezes. She exhibits no tenderness.  Abdominal: Soft. Bowel sounds are normal. She exhibits no distension. There is no tenderness.  Musculoskeletal: Normal range of motion.  Neurological: She is alert and oriented to person, place, and time. No cranial nerve deficit.  Skin: Skin is warm and dry. Bruising and ecchymosis noted. No rash noted.  On face and around eyes  Psychiatric: Her affect is inappropriate. She exhibits a depressed mood.   LABORATORY PANEL:  Female CBC  Recent Labs Lab 02/26/17 0503  WBC 5.8  HGB 11.6*  HCT 34.7*  PLT 215   ------------------------------------------------------------------------------------------------------------------ Chemistries   Recent Labs Lab 02/23/17 1059  02/26/17 0503  NA 136  < > 142  K 4.8  < > 3.6  CL 100*  < > 113*  CO2 28  < > 23  GLUCOSE 154*  < > 69  BUN 29*  < > 8  CREATININE 1.12*  < > 0.64  CALCIUM 8.7*  < > 8.9  AST 57*  --   --   ALT 15  --   --   ALKPHOS 51  --   --   BILITOT 0.4  --   --   < > = values in this interval not displayed. RADIOLOGY:  No results found. ASSESSMENT AND PLAN:  71 y.o. female with a known history of  Ch back pain, CAD, Htn, OM, Stroke, - fell on floor and could not get up, so called EMS. Not a very clear historian  *Acute metabolic encephalopathy: Likely multifactorial - Opioid overdose/abuse, Fall, Rhabdomyolysis  - psychiatry input appreciated -Mental status seems quite close to her baseline now   * rhabdomyolysis -Continue normal saline at 125 cc an hour -CK coming down from 2000 -> 1999-> 700->439  *Hypertension -Continue metoprolol and  lisinopril. -continue Imdur 30 mg once daily.  * ARF - prerenal - improving with ivf  * Depression - psych input appreciated.  - Continue Remeron, Effexor, trazodone  * Chronic pain. - resume MS-Contin and celebrex -This seems to be her biggest challenge.  She does seem to have been fired from several different practices and [facility]  *Hypokalemia -Replete and recheck    At this point she is not able to go back to her facility.  Social worker is aware and is working on placement.  Medically stable for discharge.  All the records are reviewed and case discussed with Care Management/Social Worker. -I just found out patient now has been evicted from her current residential facility. she may not be able to get her dog back which may upset her.  Per social worker she will need physical therapy evaluation for skilled nursing facility placement. please note patient refused evaluation yesterday.  Overall this seems to be difficult disposition. medically she should be okay for discharge tomorrow.   Management plans discussed with the patient, family and they are in agreement.  CODE STATUS: DNR  TOTAL TIME TAKING CARE OF THIS PATIENT: 35 minutes.   More than 50% of the time was spent in counseling/coordination of care: YES  POSSIBLE D/C IN today or in am, DEPENDING ON CLINICAL CONDITION.  And placement   Delfino LovettVipul Jaquasia Doscher M.D on 02/26/2017 at 2:23 PM  Between 7am to 6pm - Pager - [phone]  After 6pm go to [email or web address] - Social research officer, governmentpassword EPAS [facility]  [employer]  Hospitalists  Office  [phone]  CC: Primary care physician; Lauro RegulusANDERSON,MARSHALL W., MD  Note: This dictation was prepared with Dragon dictation along with smaller phrase technology. Any transcriptional errors that result from this process are unintentional.

## 2017-02-26 NOTE — Clinical Social Work Note (Signed)
CSW met with pt to address dispo. Pt has chosen to go to SNF to build strength. CSW spoke with [facility] [employer], an Josem Kaufmann is needed, and it will need to be reviewed by Medical Director. Pt has a currently [facility], however it will need to be updated due to change in condition. CSW provided current bed offer, however would like to go to [facility]. CSW spoke to [employer], and they will verify insurance. CSW left a message with [place] worker. CSW also updated MD. CSW will continue to follow.   Darden Dates, MSW, LCSW  Clinical Social Worker  [phone]

## 2017-02-26 NOTE — Consult Note (Signed)
   [facility] CM Inpatient Consult   02/26/2017  [name] 06/02/1946 [id number]   Referral received from Dr. Carlynn Spry for [facility] services and post hospital discharge follow up related to a diagnosis of diabetes and frequent falls. Patient was evaluated for community based chronic disease management services with [facility] care Management Program as a benefit of patient's [employer] Medicare. Met with the patient at the bedside to explain [facility] services, patient has had services in the past and stated they were helpful. Patient endorses her primary care provider used to be Dr. Ouida Sills but they have dismissed her for many missed appointments. Patient states she has been living at [facility] and would like to return there after she gets her strength up but is afraid they will not let her return related to her dog barking. She talked about this situation right much during our conversation and became teary talking about having to move away from friends. She states she really loves her dog and would be willing to move to another apartment with-in the building just to be able to stay.  Consent form previously signed.  At this time patient is hoping to go to a SNF to get her strength up before returning to an assisted living situation. Patient will receive post hospital discharge calls and be evaluated for monthly home visits if discharged to home or a visit for discharge planning assistance from a [facility] Social Worker if discharged to a SNF. [facility] Care Management services does not interfere with or replace any services arranged by the inpatient care management team. RNCM left contact information and [facility] literature at the bedside. Made inpatient RNCM aware that [facility] will be following for care management. For additional questions please contact:   Erika Slaby RN, [facility] Liaison  [phone]) Business Mobile [phone]) Toll free  office

## 2017-02-26 NOTE — Discharge Instructions (Signed)
Rhabdomyolysis °Rhabdomyolysis is a condition that happens when muscle cells break down and release substances into the blood that can damage the kidneys. This condition happens because of damage to the muscles that move bones (skeletal muscle). When the skeletal muscles are damaged, substances inside the muscle cells go into the blood. One of these substances is a protein called myoglobin. °Large amounts of myoglobin can cause kidney damage or kidney failure. Other substances that are released by muscle cells may upset the balance of the minerals (electrolytes) in your blood. This imbalance causes your blood to have too much acid (acidosis). °What are the causes? °This condition is caused by muscle damage. Muscle damage often happens because of: °· Using your muscles too much. °· An injury that crushes or squeezes a muscle too tightly. °· Using illegal drugs, mainly cocaine. °· Alcohol abuse. °Other possible causes include: °· Prescription medicines, such as those that: °¨ Lower cholesterol (statins). °¨ Treat ADHD (attention deficit hyperactivity disorder) or help with weight loss (amphetamines). °¨ Treat pain (opiates). °· Infections. °· Muscle diseases that are passed down from parent to child (inherited). °· High fever. °· Heatstroke. °· Not having enough fluids in your body (dehydration). °· Seizures. °· Surgery. °What increases the risk? °This condition is more likely to develop in people who: °· Have a family history of muscle disease. °· Take part in extreme sports, such as running in marathons. °· Have diabetes. °· Are older. °· Abuse drugs or alcohol. °What are the signs or symptoms? °Symptoms of this condition vary. Some people have very few symptoms, and other people have many symptoms. The most common symptoms include: °· Muscle pain and swelling. °· Weak muscles. °· Dark urine. °· Feeling weak and tired. °Other symptoms include: °· Nausea and vomiting. °· Fever. °· Pain in the abdomen. °· Pain in the  joints. °Symptoms of complications from this condition include: °· Heart rhythm that is not normal (arrhythmia). °· Seizures. °· Not urinating enough because of kidney failure. °· Very low blood pressure (shock). Signs of shock include dizziness, blurry vision, and clammy skin. °· Bleeding that is hard to stop or control. °How is this diagnosed? °This condition is diagnosed based on your medical history, your symptoms, and a physical exam. Tests may also be done, including: °· Blood tests. °· Urine tests to check for myoglobin. °You may also have other tests to check for causes of muscle damage and to check for complications. °How is this treated? °Treatment for this condition helps to: °· Make sure you have enough fluids in your body. °· Lower the acid levels in your blood to reverse acidosis. °· Protect your kidneys. °Treatment may include: °· Fluids and medicines given through an IV tube that is inserted into one of your veins. °· Medicines to lower acidosis or to bring back the balance of the minerals in your body. °· Hemodialysis. This treatment uses an artificial kidney machine to filter your blood while you recover. You may have this if other treatments are not helping. °Follow these instructions at home: °· Take over-the-counter and prescription medicines only as told by your health care provider. °· Rest at home until your health care provider says that you can return to your normal activities. °· Drink enough fluid to keep your urine clear or pale yellow. °· Do not do activities that take a lot of effort (are strenuous). Ask your health care provider what level of exercise is safe for you. °· Do not abuse drugs or alcohol.   If you are having problems with drug or alcohol use, ask your health care provider for help. °· Keep all follow-up visits as told by your health care provider. This is important. °Contact a health care provider if: °· You start having symptoms of this condition after treatment. °Get help  right away if: °· You have a seizure. °· You bleed easily or cannot control bleeding. °· You cannot urinate. °· You have chest pain. °· You have trouble breathing. °This information is not intended to replace advice given to you by your health care provider. Make sure you discuss any questions you have with your health care provider. °Document Released: 11/19/2004 Document Revised: 09/18/2016 Document Reviewed: 09/18/2016 °Elsevier Interactive Patient Education © 2017 Elsevier Inc. ° °

## 2017-02-27 LAB — GLUCOSE, CAPILLARY
GLUCOSE-CAPILLARY: 101 mg/dL — AB (ref 65–99)
Glucose-Capillary: 115 mg/dL — ABNORMAL HIGH (ref 65–99)
Glucose-Capillary: 112 mg/dL — ABNORMAL HIGH (ref 65–99)
Glucose-Capillary: 141 mg/dL — ABNORMAL HIGH (ref 65–99)

## 2017-02-27 LAB — CK: Total CK: 191 U/L (ref 38–234)

## 2017-02-27 NOTE — Progress Notes (Signed)
[facility] [place] [facility]   PATIENT NAME: Jessica Donovan    MR#:  [id number]  DATE OF BIRTH:  08/19/1946  SUBJECTIVE:  Doing well. Mood pleasant. Slept good  REVIEW OF SYSTEMS:   Review of Systems  Constitutional: Negative for chills, fever and weight loss.  HENT: Negative for ear discharge, ear pain and nosebleeds.   Eyes: Negative for blurred vision, pain and discharge.  Respiratory: Negative for sputum production, shortness of breath, wheezing and stridor.   Cardiovascular: Negative for chest pain, palpitations, orthopnea and PND.  Gastrointestinal: Negative for abdominal pain, diarrhea, nausea and vomiting.  Genitourinary: Negative for frequency and urgency.  Musculoskeletal: Negative for back pain and joint pain.  Neurological: Positive for weakness. Negative for sensory change, speech change and focal weakness.  Psychiatric/Behavioral: Negative for depression and hallucinations. The patient is not nervous/anxious.    Tolerating Diet: Tolerating PT:   DRUG ALLERGIES:   Allergies  Allergen Reactions  . Codeine Nausea And Vomiting  . Influenza Vaccines Other (See Comments)    Reaction:  Caused pt to pass out   . Methadone Hives and Itching  . Percocet [Oxycodone-Acetaminophen] Nausea And Vomiting  . Tetanus Toxoids Swelling and Other (See Comments)    Reaction:  Swelling at injection site  . Tetracyclines & Related Rash    VITALS:  Blood pressure (!) 144/67, pulse 65, temperature 97.7 F (36.5 C), temperature source Oral, resp. rate (!) 23, height 5\' 6"  (1.676 m), weight 85.7 kg (188 lb 14.4 oz), SpO2 94 %.  PHYSICAL EXAMINATION:   Physical Exam  GENERAL:  71 y.o.-year-old patient lying in the bed with no acute distress.  EYES: Pupils equal, round, reactive to light and accommodation. No scleral icterus. Extraocular muscles intact.  HEENT: Head atraumatic, normocephalic. Oropharynx and nasopharynx clear.  NECK:  Supple, no  jugular venous distention. No thyroid enlargement, no tenderness.  LUNGS: Normal breath sounds bilaterally, no wheezing, rales, rhonchi. No use of accessory muscles of respiration.  CARDIOVASCULAR: S1, S2 normal. No murmurs, rubs, or gallops.  ABDOMEN: Soft, nontender, nondistended. Bowel sounds present. No organomegaly or mass.  EXTREMITIES: No cyanosis, clubbing or edema b/l.    NEUROLOGIC: Cranial nerves II through XII are intact. No focal Motor or sensory deficits b/l.   PSYCHIATRIC:  patient is alert and oriented x 3.  SKIN: No obvious rash, lesion, or ulcer.   LABORATORY PANEL:  CBC  Recent Labs Lab 02/26/17 0503  WBC 5.8  HGB 11.6*  HCT 34.7*  PLT 215    Chemistries   Recent Labs Lab 02/23/17 1059  02/26/17 0503  NA 136  < > 142  K 4.8  < > 3.6  CL 100*  < > 113*  CO2 28  < > 23  GLUCOSE 154*  < > 69  BUN 29*  < > 8  CREATININE 1.12*  < > 0.64  CALCIUM 8.7*  < > 8.9  AST 57*  --   --   ALT 15  --   --   ALKPHOS 51  --   --   BILITOT 0.4  --   --   < > = values in this interval not displayed. Cardiac Enzymes  Recent Labs Lab 02/23/17 1059  TROPONINI <0.03   RADIOLOGY:  No results found. ASSESSMENT AND PLAN:    71 y.o.femalewith a known history of Ch back pain, CAD, Htn, OM, Stroke, - fell on floor and could not get up, so called EMS. Not a  very clear historian  *Acute metabolic encephalopathy: Likely multifactorial - Opioid overdose/abuse, Fall, Rhabdomyolysis  - psychiatry input appreciated -Mental status seems quite close to her baseline now   *Acute  rhabdomyolysis -received IVF. No w d/ced -CK coming down from 2000 -> 1999-> 700->439  *Hypertension -Continue metoprolol and lisinopril. -continue Imdur 30 mg once daily.  * ARF - prerenal - improved with ivf  * Depression - psych input appreciated.  - Continue Remeron, Effexor, trazodone  * Chronic pain. - resume MS-Contin and celebrex -This seems to be her biggest challenge.   She does seem to have been fired from several different practices and [facility]  Pt is medically stable for discharge. D/c once place available for her   Case discussed with Care Management/Social Worker. Management plans discussed with the patient, family and they are in agreement.  CODE STATUS: DNR  DVT Prophylaxis: lovenox TOTAL TIME TAKING CARE OF THIS PATIENT: 25 minutes.  >50% time spent on counselling and coordination of care  Note: This dictation was prepared with Dragon dictation along with smaller phrase technology. Any transcriptional errors that result from this process are unintentional.  Rudransh Bellanca M.D on 02/27/2017 at 7:46 AM  Between 7am to 6pm - Pager - [phone]  After 6pm go to [email or web address] - password [email or web address]  [facility]  Office  [phone]  CC: Primary care physician; Lauro RegulusANDERSON,MARSHALL W., MD

## 2017-02-27 NOTE — Clinical Social Work Note (Signed)
Patient's insurance and PASRR are still pending. According to the handoff and notes in the chart, the patient will not be able to dc until her insurance and PASRR have cleared. CSW will con't to follow.  Argentina PonderKaren Martha Atilano Covelli, MSW, Theresia MajorsLCSWA [phone]

## 2017-02-27 NOTE — Progress Notes (Signed)
Pt awaiting approval by PASRR for discharge. Pt reports she is feeling much better and "just alittle sore." Encouraged pt to continue water and fluid intake. Denies co's/concerns.

## 2017-02-28 LAB — CULTURE, BLOOD (ROUTINE X 2)
CULTURE: NO GROWTH
Culture: NO GROWTH

## 2017-02-28 LAB — GLUCOSE, CAPILLARY
Glucose-Capillary: 132 mg/dL — ABNORMAL HIGH (ref 65–99)
Glucose-Capillary: 145 mg/dL — ABNORMAL HIGH (ref 65–99)
Glucose-Capillary: 110 mg/dL — ABNORMAL HIGH (ref 65–99)
Glucose-Capillary: 117 mg/dL — ABNORMAL HIGH (ref 65–99)

## 2017-02-28 NOTE — Progress Notes (Signed)
[facility] - [place] at [facility]   PATIENT NAME: Jessica Donovan    MR#:  [id number]  DATE OF BIRTH:  1946/06/22  SUBJECTIVE:  Doing well. Mood pleasant. Slept good  REVIEW OF SYSTEMS:   Review of Systems  Constitutional: Negative for chills, fever and weight loss.  HENT: Negative for ear discharge, ear pain and nosebleeds.   Eyes: Negative for blurred vision, pain and discharge.  Respiratory: Negative for sputum production, shortness of breath, wheezing and stridor.   Cardiovascular: Negative for chest pain, palpitations, orthopnea and PND.  Gastrointestinal: Negative for abdominal pain, diarrhea, nausea and vomiting.  Genitourinary: Negative for frequency and urgency.  Musculoskeletal: Negative for back pain and joint pain.  Neurological: Positive for weakness. Negative for sensory change, speech change and focal weakness.  Psychiatric/Behavioral: Negative for depression and hallucinations. The patient is not nervous/anxious.    Tolerating Diet:yes Tolerating PT: rehab  DRUG ALLERGIES:   Allergies  Allergen Reactions  . Codeine Nausea And Vomiting  . Influenza Vaccines Other (See Comments)    Reaction:  Caused pt to pass out   . Methadone Hives and Itching  . Percocet [Oxycodone-Acetaminophen] Nausea And Vomiting  . Tetanus Toxoids Swelling and Other (See Comments)    Reaction:  Swelling at injection site  . Tetracyclines & Related Rash    VITALS:  Blood pressure (!) 156/84, pulse 69, temperature 97.7 F (36.5 C), temperature source Oral, resp. rate 20, height 5\' 6"  (1.676 m), weight 85.7 kg (188 lb 14.4 oz), SpO2 96 %.  PHYSICAL EXAMINATION:   Physical Exam  GENERAL:  71 y.o.-year-old patient lying in the bed with no acute distress.  EYES: Pupils equal, round, reactive to light and accommodation. No scleral icterus. Extraocular muscles intact.  HEENT: Head atraumatic, normocephalic. Oropharynx and nasopharynx clear.  NECK:  Supple, no  jugular venous distention. No thyroid enlargement, no tenderness.  LUNGS: Normal breath sounds bilaterally, no wheezing, rales, rhonchi. No use of accessory muscles of respiration.  CARDIOVASCULAR: S1, S2 normal. No murmurs, rubs, or gallops.  ABDOMEN: Soft, nontender, nondistended. Bowel sounds present. No organomegaly or mass.  EXTREMITIES: No cyanosis, clubbing or edema b/l.    NEUROLOGIC: Cranial nerves II through XII are intact. No focal Motor or sensory deficits b/l.   PSYCHIATRIC:  patient is alert and oriented x 3.  SKIN: No obvious rash, lesion, or ulcer.   LABORATORY PANEL:  CBC  Recent Labs Lab 02/26/17 0503  WBC 5.8  HGB 11.6*  HCT 34.7*  PLT 215    Chemistries   Recent Labs Lab 02/23/17 1059  02/26/17 0503  NA 136  < > 142  K 4.8  < > 3.6  CL 100*  < > 113*  CO2 28  < > 23  GLUCOSE 154*  < > 69  BUN 29*  < > 8  CREATININE 1.12*  < > 0.64  CALCIUM 8.7*  < > 8.9  AST 57*  --   --   ALT 15  --   --   ALKPHOS 51  --   --   BILITOT 0.4  --   --   < > = values in this interval not displayed. Cardiac Enzymes  Recent Labs Lab 02/23/17 1059  TROPONINI <0.03   RADIOLOGY:  No results found. ASSESSMENT AND PLAN:    71 y.o.femalewith a known history of Ch back pain, CAD, Htn, OM, Stroke, - fell on floor and could not get up, so called EMS. Not a very  clear historian  *Acute metabolic encephalopathy: Likely multifactorial - Opioid overdose/abuse, Fall, Rhabdomyolysis  - psychiatry input appreciated -Mental status seems quite close to her baseline now   *Acute  rhabdomyolysis -received IVF. No w d/ced -CK coming down from 2000 -> 1999-> 700->439  *Hypertension -Continue metoprolol and lisinopril. -continue Imdur 30 mg once daily.  * ARF - prerenal - improved with ivf  * Depression - psych input appreciated.  - Continue Remeron, Effexor, trazodone  * Chronic pain. - resume MS-Contin and celebrex -This seems to be her biggest challenge.   She does seem to have been fired from several different practices and [facility]  Pt is medically stable for discharge. D/c once place available for her   Case discussed with Care Management/Social Worker. Management plans discussed with the patient, family and they are in agreement.  CODE STATUS: DNR  DVT Prophylaxis: lovenox TOTAL TIME TAKING CARE OF THIS PATIENT: 25 minutes.  >50% time spent on counselling and coordination of care  Note: This dictation was prepared with Dragon dictation along with smaller phrase technology. Any transcriptional errors that result from this process are unintentional.  Cleda Imel M.D on 02/28/2017 at 2:29 PM  Between 7am to 6pm - Pager - [phone]  After 6pm go to [email or web address] - password [email or web address]  [facility] [place] [facility]  Office  [phone]  CC: Primary care physician; Lauro RegulusANDERSON,MARSHALL W., MD

## 2017-02-28 NOTE — Progress Notes (Signed)
Reports she continues to feel better Up to BR with walker and tolerated well. Skin tear clean/intact at right orbit left open to air after cleansing. Pink foam dsgs changed to right knee with open, clean/granular abrasion; left elbow with dry abrasion with pink foam applied for protection against bed sheet; left lateral ankle has small amount yellow slough with yellow eschar- cleansed with new foam dsg applied. Multiple ecchynmosis resolving face, arms, legs, trunk. Awaiting placement at SNF. Denies co's/concerns. Eating/drinking well.

## 2017-03-01 DIAGNOSIS — G8929 Other chronic pain: Secondary | ICD-10-CM | POA: Diagnosis not present

## 2017-03-01 DIAGNOSIS — T50904A Poisoning by unspecified drugs, medicaments and biological substances, undetermined, initial encounter: Secondary | ICD-10-CM | POA: Diagnosis not present

## 2017-03-01 DIAGNOSIS — M6281 Muscle weakness (generalized): Secondary | ICD-10-CM | POA: Diagnosis not present

## 2017-03-01 DIAGNOSIS — F339 Major depressive disorder, recurrent, unspecified: Secondary | ICD-10-CM | POA: Diagnosis not present

## 2017-03-01 DIAGNOSIS — M62838 Other muscle spasm: Secondary | ICD-10-CM | POA: Diagnosis not present

## 2017-03-01 DIAGNOSIS — K219 Gastro-esophageal reflux disease without esophagitis: Secondary | ICD-10-CM | POA: Diagnosis not present

## 2017-03-01 DIAGNOSIS — R739 Hyperglycemia, unspecified: Secondary | ICD-10-CM | POA: Diagnosis not present

## 2017-03-01 DIAGNOSIS — I739 Peripheral vascular disease, unspecified: Secondary | ICD-10-CM | POA: Diagnosis not present

## 2017-03-01 DIAGNOSIS — M6282 Rhabdomyolysis: Secondary | ICD-10-CM | POA: Diagnosis not present

## 2017-03-01 DIAGNOSIS — N390 Urinary tract infection, site not specified: Secondary | ICD-10-CM | POA: Diagnosis not present

## 2017-03-01 DIAGNOSIS — F064 Anxiety disorder due to known physiological condition: Secondary | ICD-10-CM | POA: Diagnosis not present

## 2017-03-01 DIAGNOSIS — Z5189 Encounter for other specified aftercare: Secondary | ICD-10-CM | POA: Diagnosis not present

## 2017-03-01 DIAGNOSIS — T402X1A Poisoning by other opioids, accidental (unintentional), initial encounter: Secondary | ICD-10-CM | POA: Diagnosis not present

## 2017-03-01 DIAGNOSIS — D81818 Other biotin-dependent carboxylase deficiency: Secondary | ICD-10-CM | POA: Diagnosis not present

## 2017-03-01 DIAGNOSIS — D518 Other vitamin B12 deficiency anemias: Secondary | ICD-10-CM | POA: Diagnosis not present

## 2017-03-01 DIAGNOSIS — R4182 Altered mental status, unspecified: Secondary | ICD-10-CM | POA: Diagnosis not present

## 2017-03-01 DIAGNOSIS — I21A9 Other myocardial infarction type: Secondary | ICD-10-CM | POA: Diagnosis not present

## 2017-03-01 DIAGNOSIS — W19XXXA Unspecified fall, initial encounter: Secondary | ICD-10-CM | POA: Diagnosis not present

## 2017-03-01 DIAGNOSIS — Z743 Need for continuous supervision: Secondary | ICD-10-CM | POA: Diagnosis not present

## 2017-03-01 DIAGNOSIS — I10 Essential (primary) hypertension: Secondary | ICD-10-CM | POA: Diagnosis not present

## 2017-03-01 DIAGNOSIS — E611 Iron deficiency: Secondary | ICD-10-CM | POA: Diagnosis not present

## 2017-03-01 DIAGNOSIS — E559 Vitamin D deficiency, unspecified: Secondary | ICD-10-CM | POA: Diagnosis not present

## 2017-03-01 DIAGNOSIS — E785 Hyperlipidemia, unspecified: Secondary | ICD-10-CM | POA: Diagnosis not present

## 2017-03-01 LAB — GLUCOSE, CAPILLARY
GLUCOSE-CAPILLARY: 144 mg/dL — AB (ref 65–99)
Glucose-Capillary: 127 mg/dL — ABNORMAL HIGH (ref 65–99)

## 2017-03-01 MED ORDER — INSULIN GLARGINE 300 UNIT/ML ~~LOC~~ SOPN: 30.0000 [IU] | PEN_INJECTOR | Freq: Every day | SUBCUTANEOUS | 0 refills | Status: DC

## 2017-03-01 MED ORDER — MORPHINE SULFATE ER 15 MG PO TBCR: 15.0000 mg | EXTENDED_RELEASE_TABLET | Freq: Two times a day (BID) | ORAL | 0 refills | Status: DC

## 2017-03-01 MED ORDER — FUROSEMIDE 20 MG PO TABS: 20.0000 mg | ORAL_TABLET | Freq: Every day | ORAL | 0 refills | Status: DC | PRN

## 2017-03-01 MED ORDER — NITROFURANTOIN MONOHYD MACRO 100 MG PO CAPS: 100.0000 mg | ORAL_CAPSULE | Freq: Every day | ORAL | 0 refills | Status: DC

## 2017-03-01 MED ORDER — NITROFURANTOIN MONOHYD MACRO 100 MG PO CAPS
100.0000 mg | ORAL_CAPSULE | Freq: Every day | ORAL | Status: DC
  Filled 2017-03-01: qty 1

## 2017-03-01 NOTE — Care Management Important Message (Signed)
Important Message  Patient Details  Name: Jessica Donovan MRN: [id number] Date of Birth: [birth year]   Medicare Important Message Given:  Yes    Gwenette GreetBrenda S Kasiyah Platter, RN 03/01/2017, 8:29 AM

## 2017-03-01 NOTE — Clinical Social Work Note (Signed)
Pt is ready for discharge today and will discharge to [facility]. [employer] has provided auth ([id number]). PASARR has also been obtained. Pt is aware and agreeable to discharge plan. Pt's Child psychotherapistsocial worker at [facility] is aware to discharge plan. CSW updated [facility]. RN will call report. [facility]. CSW is signing off as no further needs identified.   Dede QuerySarah Caytlin Better, MSW, LCSW  Clinical Social Worker  [phone]

## 2017-03-01 NOTE — Discharge Summary (Signed)
[facility] [place] at [facility]   PATIENT NAME: Jessica DanceBarbara Donovan    MR#:  [id number]  DATE OF BIRTH:  06-Mar-1946  DATE OF ADMISSION:  02/23/2017 ADMITTING PHYSICIAN: Altamese DillingVaibhavkumar Vachhani, MD  DATE OF DISCHARGE: 03/01/17  PRIMARY CARE PHYSICIAN: Lauro RegulusANDERSON,MARSHALL W., MD    ADMISSION DIAGNOSIS:  Hypercarbia [R06.89] Renal insufficiency [N28.9] Fall, initial encounter [W19.XXXA] Non-traumatic rhabdomyolysis [M62.82] Opioid overdose, accidental or unintentional, initial encounter [T40.2X1A] Hypotension, unspecified hypotension type [I95.9]  DISCHARGE DIAGNOSIS:  Acute metabolic Encephalopathy-opiod overuse Acute rhabdomyolysis Acute renal failure Chronic pain syndrome SECONDARY DIAGNOSIS:   Past Medical History:  Diagnosis Date  . Chronic back pain   . Collagen vascular disease (HCC)   . Coronary artery disease   . Diabetes mellitus without complication (HCC)   . Hypertension   . Low back pain   . Myocardial infarction   . Osteomyelitis of toe (HCC) 01/23/2016  . Stroke [facility](HCC)     HOSPITAL COURSE:   71 y.o.femalewith a known history of Ch back pain, CAD, Htn, OM, Stroke, - fell on floor and could not get up, so called EMS. Not a very clear historian  *Acute metabolic encephalopathy: Likely multifactorial - Opioid overdose/abuse, Fall, Rhabdomyolysis  - psychiatry input appreciated -Mental status seems quite close to her baseline now   *Acute  rhabdomyolysis -received IVF. No w d/ced -CK coming down from 2000 -> 1999-> 700->439  *Hypertension -Continue metoprolol and lisinopril. -continue Imdur 30 mg once daily.  * ARF - prerenal - improved with ivf  * Depression - psych input appreciated.  - Continue Remeron, Effexor, trazodone  * Chronic pain. - resume MS-Contin and celebrex -This seems to be her biggest challenge. She does seem to have been fired [facility] several [facility]  Pt is medically  stable for discharge. D/c once place available for her CONSULTS OBTAINED:  Treatment Team:  Audery AmelJohn T Clapacs, MD  DRUG ALLERGIES:   Allergies  Allergen Reactions  . Codeine Nausea And Vomiting  . Influenza Vaccines Other (See Comments)    Reaction:  Caused pt to pass out   . Methadone Hives and Itching  . Percocet [Oxycodone-Acetaminophen] Nausea And Vomiting  . Tetanus Toxoids Swelling and Other (See Comments)    Reaction:  Swelling at injection site  . Tetracyclines & Related Rash    DISCHARGE MEDICATIONS:   Current Discharge Medication List    START taking these medications   Details  morphine (MS CONTIN) 15 MG 12 hr tablet Take 1 tablet (15 mg total) by mouth every 12 (twelve) hours. Qty: 30 tablet, Refills: 0      CONTINUE these medications which have CHANGED   Details  furosemide (LASIX) 20 MG tablet Take 1 tablet (20 mg total) by mouth daily as needed. Qty: 30 tablet, Refills: 0    Insulin Glargine (TOUJEO SOLOSTAR) 300 UNIT/ML SOPN Inject 30 Units into the skin daily. Qty: 1 pen, Refills: 0    nitrofurantoin, macrocrystal-monohydrate, (MACROBID) 100 MG capsule Take 1 capsule (100 mg total) by mouth daily. Qty: 30 capsule, Refills: 0      CONTINUE these medications which have NOT CHANGED   Details  acetaminophen (TYLENOL) 500 MG tablet Take 1,000 mg by mouth every 4 (four) hours as needed for mild pain or headache.     ALPRAZolam (XANAX) 1 MG tablet Take 1 mg by mouth 3 (three) times daily.     atorvastatin (LIPITOR) 20 MG tablet Take 20 mg by mouth daily.  celecoxib (CELEBREX) 200 MG capsule Take 1 capsule by mouth daily.    cyclobenzaprine (FLEXERIL) 10 MG tablet Take 1 tablet (10 mg total) by mouth 3 (three) times daily as needed for muscle spasms. Qty: 20 tablet, Refills: 0    ferrous sulfate 325 (65 FE) MG tablet Take 325 mg by mouth daily with breakfast.    isosorbide mononitrate (IMDUR) 30 MG 24 hr tablet Take 30 mg by mouth daily.      lisinopril (PRINIVIL,ZESTRIL) 20 MG tablet Take 20 mg by mouth daily.    metoprolol tartrate (LOPRESSOR) 25 MG tablet Take 25 mg by mouth 2 (two) times daily.    mirtazapine (REMERON) 15 MG tablet Take 15 mg by mouth at bedtime.    omeprazole (PRILOSEC) 20 MG capsule Take 20 mg by mouth daily.    traZODone (DESYREL) 100 MG tablet Take 100 mg by mouth at bedtime.    venlafaxine XR (EFFEXOR-XR) 150 MG 24 hr capsule Take 150 mg by mouth daily with breakfast.    Vitamin D, Ergocalciferol, (DRISDOL) 50000 units CAPS capsule Take 50,000 Units by mouth every 7 (seven) days. Pt takes on Sunday.    aspirin EC 81 MG tablet Take 81 mg by mouth daily.    Biotin 5 MG CAPS Take 5 mg by mouth daily at 12 noon.     insulin aspart (NOVOLOG) 100 UNIT/ML injection Inject 3-15 Units into the skin 3 (three) times daily with meals as needed for high blood sugar. Pt uses as needed per sliding scale:    Less than 140:  0 units  140-180:  3 units 181-220:  4 units 221- 260:  6 units 261- 320:  8 units 321-360:  10 units 361-400:  12 units Greater than 400:  15 units    Multiple Vitamin (MULTIVITAMIN WITH MINERALS) TABS tablet Take 1 tablet by mouth daily.    nitroGLYCERIN (NITROSTAT) 0.4 MG SL tablet Place 0.4 mg under the tongue every 5 (five) minutes as needed for chest pain. Reported on 04/16/2016    pregabalin (LYRICA) 150 MG capsule Take 1 capsule (150 mg total) by mouth 2 (two) times daily.    ticagrelor (BRILINTA) 90 MG TABS tablet Take 90 mg by mouth 2 (two) times daily.    tolterodine (DETROL LA) 4 MG 24 hr capsule Take 4 mg by mouth daily.    vitamin B-12 (CYANOCOBALAMIN) 1000 MCG tablet Take 1,000 mcg by mouth daily.      STOP taking these medications     morphine (MSIR) 15 MG tablet      Morphine Sulfate ER 30 MG TBEA      ciprofloxacin (CIPRO) 500 MG tablet      metroNIDAZOLE (FLAGYL) 500 MG tablet         If you experience worsening of your admission symptoms, develop  shortness of breath, life threatening emergency, suicidal or homicidal thoughts you must seek medical attention immediately by calling 911 or calling your MD immediately  if symptoms less severe.  You Must read complete instructions/literature along with all the possible adverse reactions/side effects for all the Medicines you take and that have been prescribed to you. Take any new Medicines after you have completely understood and accept all the possible adverse reactions/side effects.   Please note  You were cared for by a hospitalist during your hospital stay. If you have any questions about your discharge medications or the care you received while you were in the hospital after you are discharged, you can call  the unit and asked to speak with the hospitalist on call if the hospitalist that took care of you is not available. Once you are discharged, your primary care physician will handle any further medical issues. Please note that NO REFILLS for any discharge medications will be authorized once you are discharged, as it is imperative that you return to your primary care physician (or establish a relationship with a primary care physician if you do not have one) for your aftercare needs so that they can reassess your need for medications and monitor your lab values. Today   SUBJECTIVE   Doing ok  VITAL SIGNS:  Blood pressure (!) 130/54, pulse 72, temperature 98.4 F (36.9 C), resp. rate 20, height 5\' 6"  (1.676 m), weight 85.7 kg (188 lb 14.4 oz), SpO2 95 %.  I/O:   Intake/Output Summary (Last 24 hours) at 03/01/17 0757 Last data filed at 02/28/17 1900  Gross per 24 hour  Intake              600 ml  Output                0 ml  Net              600 ml    PHYSICAL EXAMINATION:  GENERAL:  71 y.o.-year-old patient lying in the bed with no acute distress.  EYES: Pupils equal, round, reactive to light and accommodation. No scleral icterus. Extraocular muscles intact.  HEENT: Head  atraumatic, normocephalic. Oropharynx and nasopharynx clear.  NECK:  Supple, no jugular venous distention. No thyroid enlargement, no tenderness.  LUNGS: Normal breath sounds bilaterally, no wheezing, rales,rhonchi or crepitation. No use of accessory muscles of respiration.  CARDIOVASCULAR: S1, S2 normal. No murmurs, rubs, or gallops.  ABDOMEN: Soft, non-tender, non-distended. Bowel sounds present. No organomegaly or mass.  EXTREMITIES: No pedal edema, cyanosis, or clubbing.  NEUROLOGIC: Cranial nerves II through XII are intact. Muscle strength 5/5 in all extremities. Sensation intact. Gait not checked.  PSYCHIATRIC: The patient is alert and oriented x 3.  SKIN: No obvious rash, lesion, or ulcer.   DATA REVIEW:   CBC   Recent Labs Lab 02/26/17 0503  WBC 5.8  HGB 11.6*  HCT 34.7*  PLT 215    Chemistries   Recent Labs Lab 02/23/17 1059  02/26/17 0503  NA 136  < > 142  K 4.8  < > 3.6  CL 100*  < > 113*  CO2 28  < > 23  GLUCOSE 154*  < > 69  BUN 29*  < > 8  CREATININE 1.12*  < > 0.64  CALCIUM 8.7*  < > 8.9  AST 57*  --   --   ALT 15  --   --   ALKPHOS 51  --   --   BILITOT 0.4  --   --   < > = values in this interval not displayed.  Microbiology Results   Recent Results (from the past 240 hour(s))  Blood culture (routine x 2)     Status: None   Collection Time: 02/23/17 11:00 AM  Result Value Ref Range Status   Specimen Description BLOOD RIGHT WRIST  Final   Special Requests BOTTLES DRAWN AEROBIC AND ANAEROBIC BCAV  Final   Culture NO GROWTH 5 DAYS  Final   Report Status 02/28/2017 FINAL  Final  Blood culture (routine x 2)     Status: None   Collection Time: 02/23/17 11:02 AM  Result Value Ref Range Status  Specimen Description BLOOD RIGHT HAND  Final   Special Requests BOTTLES DRAWN AEROBIC AND ANAEROBIC BCAV  Final   Culture NO GROWTH 5 DAYS  Final   Report Status 02/28/2017 FINAL  Final  MRSA PCR Screening     Status: None   Collection Time: 02/23/17  4:38  PM  Result Value Ref Range Status   MRSA by PCR NEGATIVE NEGATIVE Final    Comment:        The GeneXpert MRSA Assay (FDA approved for NASAL specimens only), is one component of a comprehensive MRSA colonization surveillance program. It is not intended to diagnose MRSA infection nor to guide or monitor treatment for MRSA infections.     RADIOLOGY:  No results found.   Management plans discussed with the patient, family and they are in agreement.  CODE STATUS:     Code Status Orders        Start     Ordered   02/23/17 1641  Do not attempt resuscitation (DNR)  Continuous    Question Answer Comment  In the event of cardiac or respiratory ARREST Do not call a "code blue"   In the event of cardiac or respiratory ARREST Do not perform Intubation, CPR, defibrillation or ACLS   In the event of cardiac or respiratory ARREST Use medication by any route, position, wound care, and other measures to relive pain and suffering. May use oxygen, suction and manual treatment of airway obstruction as needed for comfort.      02/23/17 1640    Code Status History    Date Active Date Inactive Code Status Order ID Comments User Context   01/17/2017  4:07 AM 01/19/2017  8:10 PM DNR [id number]  Arnaldo Natal, MD Inpatient   04/09/2016  3:11 AM 04/10/2016  8:44 PM DNR [id number]  Arnaldo Natal, MD Inpatient   04/09/2016  2:05 AM 04/09/2016  3:11 AM Full Code [id number]  Oralia Manis, MD Inpatient   04/03/2016  6:53 PM 04/04/2016  6:17 PM Full Code [id number]  Houston Siren, MD Inpatient   02/23/2016 10:52 PM 02/25/2016  4:37 PM DNR [id number]  Robley Fries, MD Inpatient   01/24/2016  3:54 PM 01/27/2016  7:06 PM DNR [id number]  Milagros Loll, MD Inpatient   01/23/2016  4:07 PM 01/23/2016  4:22 PM Full Code [id number]  Shaune Pollack, MD Inpatient   12/07/2015 10:23 PM 12/09/2015  6:58 PM Full Code [id number]  Auburn Bilberry, MD Inpatient   11/29/2015  5:41 PM 12/01/2015  6:33 PM DNR [id number]  Shaune Pollack, MD Inpatient    11/10/2015  9:54 AM 11/12/2015  9:39 PM DNR [id number]  Linus Galas, MD Inpatient   11/07/2015  5:32 PM 11/10/2015  9:54 AM DNR [id number]  Altamese Dilling, MD Inpatient   11/07/2015  3:49 PM 11/07/2015  5:32 PM Full Code [id number]  Altamese Dilling, MD Inpatient      TOTAL TIME TAKING CARE OF THIS PATIENT: 40 minutes.    Moustafa Mossa M.D on 03/01/2017 at 7:57 AM  Between 7am to 6pm - Pager - [phone] After 6pm go to [email or web address] - password [email or web address]  [facility] [place] [facility]  Office  [phone]  CC: Primary care physician; Lauro Regulus., MD

## 2017-03-01 NOTE — Progress Notes (Signed)
[facility] [place] at [facility]   PATIENT NAME: Jessica Donovan    MR#:  [id number]  DATE OF BIRTH:  [birth year]  SUBJECTIVE:  Doing well. Mood pleasant. Slept good. No new complaints  REVIEW OF SYSTEMS:   Review of Systems  Constitutional: Negative for chills, fever and weight loss.  HENT: Negative for ear discharge, ear pain and nosebleeds.   Eyes: Negative for blurred vision, pain and discharge.  Respiratory: Negative for sputum production, shortness of breath, wheezing and stridor.   Cardiovascular: Negative for chest pain, palpitations, orthopnea and PND.  Gastrointestinal: Negative for abdominal pain, diarrhea, nausea and vomiting.  Genitourinary: Negative for frequency and urgency.  Musculoskeletal: Negative for back pain and joint pain.  Neurological: Positive for weakness. Negative for sensory change, speech change and focal weakness.  Psychiatric/Behavioral: Negative for depression and hallucinations. The patient is not nervous/anxious.    Tolerating Diet:yes Tolerating PT: rehab  DRUG ALLERGIES:   Allergies  Allergen Reactions  . Codeine Nausea And Vomiting  . Influenza Vaccines Other (See Comments)    Reaction:  Caused pt to pass out   . Methadone Hives and Itching  . Percocet [Oxycodone-Acetaminophen] Nausea And Vomiting  . Tetanus Toxoids Swelling and Other (See Comments)    Reaction:  Swelling at injection site  . Tetracyclines & Related Rash    VITALS:  Blood pressure (!) 130/54, pulse 72, temperature 98.4 F (36.9 C), resp. rate 20, height 5\' 6"  (1.676 m), weight 85.7 kg (188 lb 14.4 oz), SpO2 95 %.  PHYSICAL EXAMINATION:   Physical Exam  GENERAL:  71 y.o.-year-old patient lying in the bed with no acute distress.  EYES: Pupils equal, round, reactive to light and accommodation. No scleral icterus. Extraocular muscles intact.  HEENT: Head atraumatic, normocephalic. Oropharynx and nasopharynx clear.  NECK:  Supple, no jugular  venous distention. No thyroid enlargement, no tenderness.  LUNGS: Normal breath sounds bilaterally, no wheezing, rales, rhonchi. No use of accessory muscles of respiration.  CARDIOVASCULAR: S1, S2 normal. No murmurs, rubs, or gallops.  ABDOMEN: Soft, nontender, nondistended. Bowel sounds present. No organomegaly or mass.  EXTREMITIES: No cyanosis, clubbing or edema b/l.    NEUROLOGIC: Cranial nerves II through XII are intact. No focal Motor or sensory deficits b/l.   PSYCHIATRIC:  patient is alert and oriented x 3.  SKIN: No obvious rash, lesion, or ulcer.   LABORATORY PANEL:  CBC  Recent Labs Lab 02/26/17 0503  WBC 5.8  HGB 11.6*  HCT 34.7*  PLT 215    Chemistries   Recent Labs Lab 02/23/17 1059  02/26/17 0503  NA 136  < > 142  K 4.8  < > 3.6  CL 100*  < > 113*  CO2 28  < > 23  GLUCOSE 154*  < > 69  BUN 29*  < > 8  CREATININE 1.12*  < > 0.64  CALCIUM 8.7*  < > 8.9  AST 57*  --   --   ALT 15  --   --   ALKPHOS 51  --   --   BILITOT 0.4  --   --   < > = values in this interval not displayed. Cardiac Enzymes  Recent Labs Lab 02/23/17 1059  TROPONINI <0.03   RADIOLOGY:  No results found. ASSESSMENT AND PLAN:    71 y.o.femalewith a known history of Ch back pain, CAD, Htn, OM, Stroke, - fell on floor and could not get up, so called EMS. Not a very  clear historian  *Acute metabolic encephalopathy: Likely multifactorial - Opioid overdose/abuse, Fall, Rhabdomyolysis  - psychiatry input appreciated -Mental status seems quite close to her baseline now   *Acute  rhabdomyolysis -received IVF. No w d/ced -CK coming down from 2000 -> 1999-> 700->439  *Hypertension -Continue metoprolol and lisinopril. -continue Imdur 30 mg once daily.  * ARF - prerenal - improved with ivf  * Depression - psych input appreciated.  - Continue Remeron, Effexor, trazodone  * Chronic pain. - resume MS-Contin and celebrex -This seems to be her biggest challenge.  She  does seem to have been fired from several [facility] practices [facility]  Pt is medically stable for discharge. D/c once place available for her   Case discussed with Care Management/Social Worker. Management plans discussed with the patient, family and they are in agreement.  CODE STATUS: DNR  DVT Prophylaxis: lovenox TOTAL TIME TAKING CARE OF THIS PATIENT: 25 minutes.  >50% time spent on counselling and coordination of care  Note: This dictation was prepared with Dragon dictation along with smaller phrase technology. Any transcriptional errors that result from this process are unintentional.  Georgina Krist M.D on 03/01/2017 at 6:56 AM  Between 7am to 6pm - Pager - [phone]  After 6pm go to [email or web address] - password [email or web address]  [facility] [place] [facility]  Office  [phone]  CC: Primary care physician; Lauro RegulusANDERSON,MARSHALL W., MD

## 2017-03-01 NOTE — Progress Notes (Signed)
Patient refuses bed alarm. Re educated on the purpose of bed alarm.

## 2017-03-01 NOTE — Progress Notes (Signed)
Patient discharged to [facility] per MD order. [facility]. EMS called for transportation.

## 2017-03-01 NOTE — Clinical Social Work Placement (Signed)
   CLINICAL SOCIAL WORK PLACEMENT  NOTE  Date:  03/01/2017  Patient Details  Name: Jessica Donovan MRN: [id number] Date of Birth: August 12, 1946  Clinical Social Work is seeking post-discharge placement for this patient at the Skilled  Nursing Facility level of care (*CSW will initial, date and re-position this form in  chart as items are completed):  Yes   Patient/family provided with [place] Clinical Social Work Department's list of facilities offering this level of care within the geographic area requested by the patient (or if unable, by the patient's family).  Yes   Patient/family informed of their freedom to choose among providers that offer the needed level of care, that participate in Medicare, Medicaid or managed care program needed by the patient, have an available bed and are willing to accept the patient.  Yes   Patient/family informed of [place]'s ownership interest in [facility] and [facility], as well as of the fact that they are under no obligation to receive care at these facilities.  PASRR submitted to EDS on 02/26/17     PASRR number received on 03/01/17     Existing PASRR number confirmed on       FL2 transmitted to all facilities in geographic area requested by pt/family on 02/25/17     FL2 transmitted to all facilities within larger geographic area on       Patient informed that his/her managed care company has contracts with or will negotiate with certain facilities, including the following:        Yes   Patient/family informed of bed offers received.  [facility] [place]     Physician recommends and patient chooses bed at      Patient to be transferred to [facility] on 03/01/17.  [facility]     Patient family notified on 03/01/17 of transfer.  Name of family member notified:  Pt's social worker, [name] at [facility] and [facility].      PHYSICIAN       Additional Comment:    _______________________________________________ Dede QuerySarah Jamile Sivils, LCSW 03/01/2017, 3:02 PM

## 2017-03-04 DIAGNOSIS — Z8673 Personal history of transient ischemic attack (TIA), and cerebral infarction without residual deficits: Secondary | ICD-10-CM | POA: Diagnosis not present

## 2017-03-04 DIAGNOSIS — M6281 Muscle weakness (generalized): Secondary | ICD-10-CM | POA: Diagnosis not present

## 2017-03-04 DIAGNOSIS — Z79891 Long term (current) use of opiate analgesic: Secondary | ICD-10-CM | POA: Diagnosis not present

## 2017-03-04 DIAGNOSIS — E1140 Type 2 diabetes mellitus with diabetic neuropathy, unspecified: Secondary | ICD-10-CM | POA: Diagnosis not present

## 2017-03-04 DIAGNOSIS — Z7984 Long term (current) use of oral hypoglycemic drugs: Secondary | ICD-10-CM | POA: Diagnosis not present

## 2017-03-04 DIAGNOSIS — F329 Major depressive disorder, single episode, unspecified: Secondary | ICD-10-CM | POA: Diagnosis not present

## 2017-03-04 DIAGNOSIS — R2689 Other abnormalities of gait and mobility: Secondary | ICD-10-CM | POA: Diagnosis not present

## 2017-03-04 DIAGNOSIS — G894 Chronic pain syndrome: Secondary | ICD-10-CM | POA: Diagnosis not present

## 2017-03-04 DIAGNOSIS — F419 Anxiety disorder, unspecified: Secondary | ICD-10-CM | POA: Diagnosis not present

## 2017-03-04 DIAGNOSIS — G9340 Encephalopathy, unspecified: Secondary | ICD-10-CM | POA: Diagnosis not present

## 2017-03-04 DIAGNOSIS — K219 Gastro-esophageal reflux disease without esophagitis: Secondary | ICD-10-CM | POA: Diagnosis not present

## 2017-03-08 ENCOUNTER — Ambulatory Visit: Payer: Self-pay | Admitting: *Deleted

## 2017-03-09 ENCOUNTER — Other Ambulatory Visit: Payer: Self-pay | Admitting: *Deleted

## 2017-03-09 ENCOUNTER — Encounter: Payer: Self-pay | Admitting: *Deleted

## 2017-03-09 NOTE — Patient Outreach (Signed)
[facility] Care Management  03/09/2017  Mellody DanceBarbara Caputo [birth year] [id number]   This social worker went to visit [facility] at [facility]. Patient was in PT and then going to get her hair done following therapy.     Plan: Visit to be re-scheduled for 03/09/17 3:00 pm    Adriana ReamsChrystal Ethyle Tiedt, LCSW [facility] Care [facility] [phone]

## 2017-03-09 NOTE — Patient Outreach (Signed)
[facility](THN) Care Management  [facility]  03/09/2017  Mellody DanceBarbara Ehrler Nov 22, 1946 [id number]  Subjective:  Patient is a 71 year old female currently in rehab following a fall. Per patient she will be going back to [facility]. Per patient, she is on the waiting list for [facility] that will allow her to keep her dog. Per patient, she is on several waiting list for independent facilities in the area. Per patient [facility] and will not be followed by  Dr. Garlon Hatchetobert [place] [address] [place], [place] [phone]. Per patient she is feeling stronger in her legs and arms and should be ready to discharge with HH on 03/17/17.   Objective:   Encounter Medications:  Outpatient Encounter Prescriptions as of 03/09/2017  Medication Sig Note  . acetaminophen (TYLENOL) 500 MG tablet Take 1,000 mg by mouth every 4 (four) hours as needed for mild pain or headache.    . ALPRAZolam (XANAX) 1 MG tablet Take 1 mg by mouth 3 (three) times daily.    [name]. aspirin EC 81 MG tablet Take 81 mg by mouth daily.   [name]. atorvastatin (LIPITOR) 20 MG tablet Take 20 mg by mouth daily.   . Biotin 5 MG CAPS Take 5 mg by mouth daily at 12 noon.    . celecoxib (CELEBREX) 200 MG capsule Take 1 capsule by mouth daily.   . cyclobenzaprine (FLEXERIL) 10 MG tablet Take 1 tablet (10 mg total) by mouth 3 (three) times daily as needed for muscle spasms.   . ferrous sulfate 325 (65 FE) MG tablet Take 325 mg by mouth daily with breakfast.   . furosemide (LASIX) 20 MG tablet Take 1 tablet (20 mg total) by mouth daily as needed.   . insulin aspart (NOVOLOG) 100 UNIT/ML injection Inject 3-15 Units into the skin 3 (three) times daily with meals as needed for high blood sugar. Pt uses as needed per sliding scale:    Less than 140:  0 units  140-180:  3 units 181-220:  4 units 221- 260:  6 units 261- 320:  8 units 321-360:  10  units 361-400:  12 units Greater than 400:  15 units   . Insulin Glargine (TOUJEO SOLOSTAR) 300 UNIT/ML SOPN Inject 30 Units into the skin daily.   . isosorbide mononitrate (IMDUR) 30 MG 24 hr tablet Take 30 mg by mouth daily.    [name]. lisinopril (PRINIVIL,ZESTRIL) 20 MG tablet Take 20 mg by mouth daily.   . metoprolol tartrate (LOPRESSOR) 25 MG tablet Take 25 mg by mouth 2 (two) times daily.   . mirtazapine (REMERON) 15 MG tablet Take 15 mg by mouth at bedtime.   [name]. morphine (MS CONTIN) 15 MG 12 hr tablet Take 1 tablet (15 mg total) by mouth every 12 (twelve) hours.   . Multiple Vitamin (MULTIVITAMIN WITH MINERALS) TABS tablet Take 1 tablet by mouth daily.   . nitrofurantoin, macrocrystal-monohydrate, (MACROBID) 100 MG capsule Take 1 capsule (100 mg total) by mouth daily.   . nitroGLYCERIN (NITROSTAT) 0.4 MG SL tablet Place 0.4 mg under the tongue every 5 (five) minutes as needed for chest pain. Reported on 04/16/2016 04/16/2016: On hand if needed   . omeprazole (PRILOSEC) 20 MG capsule Take 20 mg by mouth daily.   . pregabalin (LYRICA) 150 MG capsule Take 1 capsule (150 mg total) by mouth 2 (two) times daily.   . ticagrelor (BRILINTA) 90 MG TABS  tablet Take 90 mg by mouth 2 (two) times daily.   [name] tolterodine (DETROL LA) 4 MG 24 hr capsule Take 4 mg by mouth daily.   . traZODone (DESYREL) 100 MG tablet Take 100 mg by mouth at bedtime.   [name] venlafaxine XR (EFFEXOR-XR) 150 MG 24 hr capsule Take 150 mg by mouth daily with breakfast.   . vitamin B-12 (CYANOCOBALAMIN) 1000 MCG tablet Take 1,000 mcg by mouth daily.   . Vitamin D, Ergocalciferol, (DRISDOL) 50000 units CAPS capsule Take 50,000 Units by mouth every 7 (seven) days. Pt takes on Sunday.    No facility-administered encounter medications on file as of 03/09/2017.     Functional Status:  In your present state of health, do you have any difficulty performing the following activities: 02/23/2017 01/17/2017  Hearing? N N  Vision? N N  Difficulty  concentrating or making decisions? [name]  Walking or climbing stairs? Y N  Dressing or bathing? Y N  Doing errands, shopping? N Y  Quarry manager and eating ? - -  Using the Toilet? - -  In the past six months, have you accidently leaked urine? - -  Do you have problems with loss of bowel control? - -  Managing your Medications? - -  Managing your Finances? - -  Housekeeping or managing your Housekeeping? - -  Some recent data might be hidden    Fall/Depression Screening:  PHQ 2/9 Scores 03/09/2016 02/21/2016  PHQ - 2 Score 2 1  PHQ- 9 Score 4 6    Assessment:  Patient will discharge home on 03/17/17 with HH if necessary.  Patient in good spririts today, stating that she is feeling stronger in her legs and arms. Per patient, she is no longer at [facility], now her primary care doctor is Dr. Garlon Hatchet [address] [place], [place]  [phone]. Per patient, she  will pay someone to take her to [place], primarily Tobi Bastos -[employer], building services  at her current residence.  Patient denied history of mental illness, or previous psychiatric treatment.  Repetitive references to  chronic back and leg pain and previous fears of stroke. Per patient, her long term goal is to return back to an assisted living in [place]. Plan:  This social worker will follow up patient post discharge from [facility] to assess for community resource needs.

## 2017-03-10 ENCOUNTER — Encounter: Payer: Self-pay | Admitting: *Deleted

## 2017-03-10 NOTE — Progress Notes (Signed)
Pt discharged to [facility] on 03/01/2017. Medication reconciliation completed by PharmD with [facility] on 03/09/2017. Discharge summary had 1000mg  acetaminophen q4h PRN for potential 6000 mg acetaminophen daily. Recommendation was made to decrease to to 1000mg  q6h to not exceed 4000mg  daily. Nurse Landry MellowBrittany Johnson was notified and left a note for the rounding MD.   Sandi CarneNick Tommye Lehenbauer, PharmD, BCPS Pharmacy Resident Pager: [phone]

## 2017-03-11 DIAGNOSIS — M6281 Muscle weakness (generalized): Secondary | ICD-10-CM | POA: Diagnosis not present

## 2017-03-11 DIAGNOSIS — Z79891 Long term (current) use of opiate analgesic: Secondary | ICD-10-CM | POA: Diagnosis not present

## 2017-03-11 DIAGNOSIS — G9340 Encephalopathy, unspecified: Secondary | ICD-10-CM | POA: Diagnosis not present

## 2017-03-11 DIAGNOSIS — Z8673 Personal history of transient ischemic attack (TIA), and cerebral infarction without residual deficits: Secondary | ICD-10-CM | POA: Diagnosis not present

## 2017-03-11 DIAGNOSIS — F329 Major depressive disorder, single episode, unspecified: Secondary | ICD-10-CM | POA: Diagnosis not present

## 2017-03-11 DIAGNOSIS — F419 Anxiety disorder, unspecified: Secondary | ICD-10-CM | POA: Diagnosis not present

## 2017-03-11 DIAGNOSIS — I10 Essential (primary) hypertension: Secondary | ICD-10-CM | POA: Diagnosis not present

## 2017-03-11 DIAGNOSIS — E119 Type 2 diabetes mellitus without complications: Secondary | ICD-10-CM | POA: Diagnosis not present

## 2017-03-11 DIAGNOSIS — G894 Chronic pain syndrome: Secondary | ICD-10-CM | POA: Diagnosis not present

## 2017-03-11 DIAGNOSIS — K219 Gastro-esophageal reflux disease without esophagitis: Secondary | ICD-10-CM | POA: Diagnosis not present

## 2017-03-16 ENCOUNTER — Other Ambulatory Visit: Payer: Self-pay | Admitting: *Deleted

## 2017-03-16 NOTE — Patient Outreach (Signed)
[facility](THN) Care Management  03/16/2017  Jessica DanceBarbara Donovan 1946/07/03 [id number]   Phone call from [facility] Woods, [facility] who confirmed that patient discharged on 03/12/17 due to insurance reasons. She declined HH services, per Shanda BumpsJessica due to not wanting to pay the co-pay.   Plan: This Child psychotherapistsocial worker will inform RNCM Jodi MourningRose Pierzchala for transition of care.   Adriana ReamsChrystal Raquel Sayres, LCSW [facility] Care [facility] [phone]

## 2017-03-16 NOTE — Patient Outreach (Signed)
[facility]N) Care Management  03/16/2017  Mellody DanceBarbara Oyola 1946/02/26 [id number]   Phone call to [facility]-left message with discharge planner Juanetta GoslingJessica Woods to confirm patient's discharge date for 03/17/17.   This Child psychotherapistsocial worker will await a return call.   Adriana ReamsChrystal Hildagard Sobecki, LCSW [facility] Care Management [phone]

## 2017-03-17 ENCOUNTER — Other Ambulatory Visit: Payer: Self-pay | Admitting: *Deleted

## 2017-03-17 NOTE — Patient Outreach (Signed)
Attempt made to contact pt, follow up on [facility] Janci RN to follow pt for transition of care: Recent hospitalization 3/6-3/12 for hypercarbia, renal insufficiency, fall, non- traumatic rhabdomyolysis, Opioid overdose- accidental or unintentional, Hypotension. Discharged to [facility] 3/12- discharged home 3/23 as was informed by [facility] LCSW 3/27.  HIPAA compliant voice message left with contact name and number.    Plan: If no response, plan to follow up again tomorrow as part of ongoing transition of care.    [name] M.   Fredick Schlosser RN CCM [facility] Care Management  [phone]

## 2017-03-18 ENCOUNTER — Other Ambulatory Visit: Payer: Self-pay | Admitting: *Deleted

## 2017-03-18 NOTE — Patient Outreach (Signed)
Transition of care call successful, follow up on referral from [facility] hospital liaison Janci RN 3/12 to follow for transition of care ( recent hospitalization 3/6-3/12 for  hypercarbia, hypotension, opoid overdose- accidental or unintentional), pt discharged to [facility]/discharged home 3/23.   Spoke with pt, HIPAA verified, discussed purpose of call - follow for transition of care/recent SNF discharge.  Pt reports currently weak, using rollator, no falls since discharge, pain better now that she is in her own bed. Pt reports her Morphine SR was decreased to 15 mg, managing her pain.   Pt reports wished she had home health PT, nursing but could not afford it.  Pt reports with her fall that resulting in recent hospitalization- has scraps on outside of both ankles/legs that require dressing changes that is hard for her to see, could have used a home health nurse.  Pt reports she was told would have to pay $140 each visit from Home health to which RN CM informed would be responsible for co pay but to call her insurance to verify. Pt reports social worker Tobi Bastos  Where she lives is helping her with her mail, reordering medications, discovered she is out of her [place], realizes needs it.   RN CM unable to review pt's medications due to discharge from SNF.   Pt reports she has changed her Primary Care MD to Dr. Garlon Hatchet in [place], [place], insurance was informed.  Pt reports has an appointment to see MD 4/24, can get transportation.  Plan:  As discussed with pt, plan to continue to follow for transition of care, follow up again next week - initial home  Visit.            Plan to inform Dr. Garlon Hatchet in [place] of [facility] involvement- fax barrier letter.   Shayne Alken.   Pierzchala RN CCM [facility] Care Management  [phone]

## 2017-03-19 ENCOUNTER — Encounter: Payer: Self-pay | Admitting: *Deleted

## 2017-03-26 ENCOUNTER — Encounter: Payer: Self-pay | Admitting: *Deleted

## 2017-03-26 ENCOUNTER — Other Ambulatory Visit: Payer: Self-pay | Admitting: *Deleted

## 2017-03-26 NOTE — Patient Outreach (Signed)
[facility]) Care Management  [facility] Social Work  03/26/2017  Jessica Donovan 01/02/1946 [id number]  Subjective:  Patient is a 71 year old female. Continues to look for a a apartment that will accommodate her dog.  Per patient, she has submitted an application to the [place] couple of days ago and is ooking into other apartment options as well. Patient is not sure when an opening would be for the [facility], maybe June or July of 2018 for the [facility]. Per patient, she  continues to see her primary care doctor in [place], her next appointment is 04/13/17. Per patient, she will see the dentist that day as well.. Patient states that,   Tobi Bastos the Resident Director can no longer no longer  Transport her  to appointments and is looking into using the Transportation resource through the [employer] for the aging or possibly paying a friend. Per patient, she uses [facility] for transportation to local appointments. . [facility] continues to deliver medicines for $2.00 , patient reports having no issues with meals and completes own housekeeping and  ADL's. Patient  continues to refuse ALF placement stating that she would like to try living alone one one more time before needing to go to ALF. Objective:   Encounter Medications:  Outpatient Encounter Prescriptions as of 03/26/2017  Medication Sig Note  . acetaminophen (TYLENOL) 500 MG tablet Take 1,000 mg by mouth every 4 (four) hours as needed for mild pain or headache.  03/26/2017: Take as needed.   . ALPRAZolam (XANAX) 1 MG tablet Take 1 mg by mouth 3 (three) times daily.    [name] aspirin EC 81 MG tablet Take 81 mg by mouth daily.   [name] atorvastatin (LIPITOR) 20 MG tablet Take 20 mg by mouth daily.   . Biotin 5 MG CAPS Take 5 mg by mouth daily at 12 noon.    . celecoxib (CELEBREX) 200 MG capsule Take 1 capsule by mouth daily.   . cyclobenzaprine (FLEXERIL) 10 MG tablet Take 1 tablet (10 mg total) by mouth 3 (three) times daily as needed for muscle spasms.    . ferrous sulfate 325 (65 FE) MG tablet Take 325 mg by mouth daily with breakfast.   . furosemide (LASIX) 20 MG tablet Take 1 tablet (20 mg total) by mouth daily as needed.   . insulin aspart (NOVOLOG) 100 UNIT/ML injection Inject 3-15 Units into the skin 3 (three) times daily with meals as needed for high blood sugar. Pt uses as needed per sliding scale:    Less than 140:  0 units  140-180:  3 units 181-220:  4 units 221- 260:  6 units 261- 320:  8 units 321-360:  10 units 361-400:  12 units Greater than 400:  15 units   . Insulin Glargine (TOUJEO SOLOSTAR) 300 UNIT/ML SOPN Inject 30 Units into the skin daily.   . isosorbide mononitrate (IMDUR) 30 MG 24 hr tablet Take 30 mg by mouth daily.    [name] lisinopril (PRINIVIL,ZESTRIL) 20 MG tablet Take 20 mg by mouth daily.   . metoprolol tartrate (LOPRESSOR) 25 MG tablet Take 25 mg by mouth 2 (two) times daily.   . mirtazapine (REMERON) 15 MG tablet Take 15 mg by mouth at bedtime.   [name] morphine (MS CONTIN) 15 MG 12 hr tablet Take 1 tablet (15 mg total) by mouth every 12 (twelve) hours. 03/26/2017: Per pt, MD increased to three times a day   . Multiple Vitamin (MULTIVITAMIN WITH MINERALS) TABS tablet Take 1  tablet by mouth daily.   . nitrofurantoin, macrocrystal-monohydrate, (MACROBID) 100 MG capsule Take 1 capsule (100 mg total) by mouth daily.   . nitroGLYCERIN (NITROSTAT) 0.4 MG SL tablet Place 0.4 mg under the tongue every 5 (five) minutes as needed for chest pain. Reported on 04/16/2016 04/16/2016: On hand if needed   . omeprazole (PRILOSEC) 20 MG capsule Take 20 mg by mouth daily.   . pregabalin (LYRICA) 150 MG capsule Take 1 capsule (150 mg total) by mouth 2 (two) times daily.   . ticagrelor (BRILINTA) 90 MG TABS tablet Take 90 mg by mouth 2 (two) times daily.   [name] tolterodine (DETROL LA) 4 MG 24 hr capsule Take 4 mg by mouth daily.   . traZODone (DESYREL) 100 MG tablet Take 100 mg by mouth at bedtime.   [name] venlafaxine XR (EFFEXOR-XR) 150 MG 24 hr  capsule Take 150 mg by mouth daily with breakfast.   . vitamin B-12 (CYANOCOBALAMIN) 1000 MCG tablet Take 1,000 mcg by mouth daily.   . Vitamin D, Ergocalciferol, (DRISDOL) 50000 units CAPS capsule Take 50,000 Units by mouth every 7 (seven) days. Pt takes on Sunday.    No facility-administered encounter medications on file as of 03/26/2017.     Functional Status:  In your present state of health, do you have any difficulty performing the following activities: 03/09/2017 02/23/2017  Hearing? N N  Vision? N N  Difficulty concentrating or making decisions? [name]  Walking or climbing stairs? Y Y  Dressing or bathing? Y Y  Doing errands, shopping? Y N  Preparing Food and eating ? N -  Using the Toilet? N -  In the past six months, have you accidently leaked urine? N -  Do you have problems with loss of bowel control? N -  Managing your Medications? N -  Managing your Finances? N -  Housekeeping or managing your Housekeeping? N -  Some recent data might be hidden    Fall/Depression Screening:  PHQ 2/9 Scores 03/09/2017 03/09/2016 02/21/2016  PHQ - 2 Score 0 2 1  PHQ- 9 Score - 4 6    Assessment:  Patient does not have a reliable source of transportation to her primary doctor in [place]. Concerns about transporation to her MD in [place] discussed.  Patient not concerned, states that she can utilize the urgent care if needed but is comfortable with her doctor being in [place] and feels he can be reached by phone if needed. Patient has also given [facility] RNCM and social worker permission to contact him if needed to discuss her care.  This Child psychotherapist provided additional resources for independent living as she continues her search to identify a place that will accommodate her dog.   Referral for pharmacy discussed for medication review,however patient declined referral.  RNCM discussed safety concerns with the amount of pain medications she is on and precautions. Patient not willing to make any changes to  her medications and is only willing to consider ALF as a last resort and  if they accept pets. Patient requested a list of ALF that accepts pets.    Plan: This Child psychotherapist will provide patient with a list of ALF facilities that accept pets in her area.    Jessica Donovan [name] Care Management [phone]

## 2017-03-26 NOTE — Patient Outreach (Signed)
[facility]) Care Management   03/26/2017  Stephenie Navejas 1946-06-13 [id number]  Jessica Donovan is an 71 y.o. female  Subjective:  Pt reports needs to find a place to move in order to keep her dog.  Pt reports she  Wishes she stayed in rehab longer for the exercises, feels it helped her.  RN CM discussed  HH PT services to which pt states not at this time, have a gym where she lives now.  Pt reports On chronic back/sciatic pain, +7 today, pain medication helping.  Pt reports MD increased both  Her Morphine 15 mg ER and Lyrica 150 mg from 2- 3 times a day.  Pt reports to follow up with  Dr. Emilia Beck 4/24.  Pt reports sugars have been running <100, today 158.   Objective:   Vitals:   03/26/17 1524 03/26/17 1648  BP: 120/70 118/70  Pulse: 62   Resp: 16     ROS  Physical Exam  Constitutional: She is oriented to person, place, and time. She appears well-developed and well-nourished.  Cardiovascular: Normal rate, regular rhythm and normal heart sounds.   Respiratory: Effort normal and breath sounds normal.  GI: Soft.  Musculoskeletal: Normal range of motion. She exhibits no edema.  Pt uses cane or rollator.   Neurological: She is alert and oriented to person, place, and time.  Skin: Skin is warm and dry.  Psychiatric: She has a normal mood and affect. Her behavior is normal. Judgment and thought content normal.    Encounter Medications:   Outpatient Encounter Prescriptions as of 03/26/2017  Medication Sig Note  . acetaminophen (TYLENOL) 500 MG tablet Take 1,000 mg by mouth every 4 (four) hours as needed for mild pain or headache.  03/26/2017: Take as needed.   . ALPRAZolam (XANAX) 1 MG tablet Take 1 mg by mouth 3 (three) times daily.    [name] aspirin EC 81 MG tablet Take 81 mg by mouth daily.   [name] atorvastatin (LIPITOR) 20 MG tablet Take 20 mg by mouth daily.   . Biotin 5000 MCG TABS Take by mouth daily.   . celecoxib (CELEBREX) 200 MG capsule Take 1 capsule by mouth  daily.   . cyclobenzaprine (FLEXERIL) 10 MG tablet Take 1 tablet (10 mg total) by mouth 3 (three) times daily as needed for muscle spasms. 03/26/2017: As needed.   . ferrous sulfate 325 (65 FE) MG tablet Take 325 mg by mouth daily with breakfast.   . fesoterodine (TOVIAZ) 8 MG TB24 tablet Take 8 mg by mouth daily. At bedtime   . furosemide (LASIX) 20 MG tablet Take 1 tablet (20 mg total) by mouth daily as needed. 03/26/2017: As needed.   . insulin aspart (NOVOLOG) 100 UNIT/ML injection Inject 3-15 Units into the skin 3 (three) times daily with meals as needed for high blood sugar. Pt uses as needed per sliding scale:    Less than 140:  0 units  140-180:  3 units 181-220:  4 units 221- 260:  6 units 261- 320:  8 units 321-360:  10 units 361-400:  12 units Greater than 400:  15 units 03/26/2017: As needed.   . Insulin Glargine (TOUJEO SOLOSTAR) 300 UNIT/ML SOPN Inject 30 Units into the skin daily. 03/26/2017: Do not take if sugar is <100  . isosorbide mononitrate (IMDUR) 30 MG 24 hr tablet Take 30 mg by mouth daily.    . isosorbide mononitrate (IMDUR) 30 MG 24 hr tablet Take 30 mg by mouth daily.   [name]  lisinopril (PRINIVIL,ZESTRIL) 20 MG tablet Take 20 mg by mouth daily.   . metoprolol tartrate (LOPRESSOR) 25 MG tablet Take 25 mg by mouth 2 (two) times daily.   . mirtazapine (REMERON) 15 MG tablet Take 15 mg by mouth at bedtime.   [name] morphine (MS CONTIN) 15 MG 12 hr tablet Take 1 tablet (15 mg total) by mouth every 12 (twelve) hours. 03/26/2017: Per pt, MD increased to three times a day   . Multiple Vitamin (MULTIVITAMIN WITH MINERALS) TABS tablet Take 1 tablet by mouth daily.   . nitrofurantoin, macrocrystal-monohydrate, (MACROBID) 100 MG capsule Take 1 capsule (100 mg total) by mouth daily.   [name] omeprazole (PRILOSEC) 20 MG capsule Take 20 mg by mouth daily.   . pregabalin (LYRICA) 150 MG capsule Take 1 capsule (150 mg total) by mouth 2 (two) times daily. 03/26/2017: Per pt, MD increased to three times a day    . promethazine (PHENERGAN) 25 MG tablet Take 25 mg by mouth every 6 (six) hours as needed. 03/26/2017: As needed.   . tolterodine (DETROL LA) 4 MG 24 hr capsule Take 4 mg by mouth daily.   . traZODone (DESYREL) 100 MG tablet Take 100 mg by mouth at bedtime.   [name] venlafaxine (EFFEXOR) 75 MG tablet Take 75 mg by mouth. Pt takes 3 capsules (225 mg) once a day   . Vitamin D, Ergocalciferol, (DRISDOL) 50000 units CAPS capsule Take 50,000 Units by mouth every 7 (seven) days. Pt takes on Sunday.   . Biotin 5 MG CAPS Take 5 mg by mouth daily at 12 noon.    . nitroGLYCERIN (NITROSTAT) 0.4 MG SL tablet Place 0.4 mg under the tongue every 5 (five) minutes as needed for chest pain. Reported on 04/16/2016 04/16/2016: On hand if needed   . ticagrelor (BRILINTA) 90 MG TABS tablet Take 90 mg by mouth 2 (two) times daily. 03/26/2017: Pt reports out of medication, waiting for company to send her some.   . venlafaxine XR (EFFEXOR-XR) 150 MG 24 hr capsule Take 150 mg by mouth daily with breakfast.   . vitamin B-12 (CYANOCOBALAMIN) 1000 MCG tablet Take 1,000 mcg by mouth daily. 03/26/2017: Ran out of    No facility-administered encounter medications on file as of 03/26/2017.     Functional Status:   In your present state of health, do you have any difficulty performing the following activities: 03/09/2017 02/23/2017  Hearing? N N  Vision? N N  Difficulty concentrating or making decisions? [name]  Walking or climbing stairs? Y Y  Dressing or bathing? Y Y  Doing errands, shopping? Y N  Preparing Food and eating ? N -  Using the Toilet? N -  In the past six months, have you accidently leaked urine? N -  Do you have problems with loss of bowel control? N -  Managing your Medications? N -  Managing your Finances? N -  Housekeeping or managing your Housekeeping? N -  Some recent data might be hidden    Fall/Depression Screening:    PHQ 2/9 Scores 03/26/2017 03/09/2017 03/09/2016 02/21/2016  PHQ - 2 Score 2 0 2 1  PHQ- 9 Score 5  - 4 6    Assessment: Co visit done with [facility] LCSW Chrystal-  Pleasant 71 year old woman, resides alone in  Independent Living apartment.     Lungs clear, no edema, no c/o sob, chest pain. Chronic pain (back, sciatic- to both legs)- +7 today, per pt taking Morphine 15 mg EF tid- helping.  Fall risk:  Hx of  falls, most recent fall  resulted in hospitalization/SNF.  Discussed with pt use of     rollator at all times- side effect of medications (Morphine, Xanax- drowsiness), back humped.  Medication issues:  Review of pt's medications- per pt taking Imdur only as needed for chest pain,      Prescription reads to take  daily to which pt was informed- voiced understanding.  Discussed also      with pt  Doing a [facility] pharmacy referral to  which pt states  not at this time.      Plan: Plan to continue to follow pt for transition of care, follow up again next week telephonically.           Pt to follow up with Dr. Emilia Beck 4/24.            Plan to fax Dr. Emilia Beck 4/6 home visit encounter.     [facility] CM Care Plan Problem One     Most Recent Value  Care Plan Problem One  Risk for readmission related to recent SNF discharge,recent hospitalization (fall, hypercarbia,hypotension,opoid overdose accidental or unintentional   Role Documenting the Problem One  Care Management Coordinator  Care Plan for Problem One  Active  [facility] Long Term Goal (31-90 days)  Pt will not readmit to SNF or hospital within the next 31 days   [facility] Long Term Goal Start Date  03/18/17  Interventions for Problem One Long Term Goal  home visit done- medications reviewed, fall prevention   [facility] CM Short Term Goal #1 (0-30 days)  Pt will keep MD follow appointment within the next 21 days   [facility] CM Short Term Goal #1 Start Date  03/26/17  Interventions for Short Term Goal #1  Reinforced with pt importance of keeping MD follow up appointment     [facility] CM Care Plan Problem Two     Most Recent Value  Care Plan Problem Two  Fall risk -    Role Documenting the Problem Two  Care Management Coordinator  Care Plan for Problem Two  Active  [facility] CM Short Term Goal #1 (0-30 days)  Pt would not have any falls in the next 30 days   [facility] CM Short Term Goal #1 Start Date  03/26/17  Interventions for Short Term Goal #2   Discussed with pt use of rollator at all times, fall risk taking pain medication/psychotropic  medications.

## 2017-03-29 ENCOUNTER — Ambulatory Visit: Payer: PPO | Admitting: Podiatry

## 2017-04-02 ENCOUNTER — Ambulatory Visit: Payer: PPO | Admitting: Podiatry

## 2017-04-02 ENCOUNTER — Other Ambulatory Visit: Payer: Self-pay | Admitting: *Deleted

## 2017-04-02 NOTE — Patient Outreach (Signed)
Transition of care call successful, ongoing follow up on recent SNF discharge 3/23 to which was admitted from hospital (recent hospitalization 3/6-3/12 for hypercarbia, hypotension,opoid overdose- accidental or unintentional).  Spoke with pt, HIPAA/identity verified.  Pt reports doing good, no recent falls, using rollator, getting stronger.  Pt reports trying to find an apartment in [place], decided want to go back to [place].  Pt reports she has 5 applications out ([place], [place]), one looking promising in [place].  Pt reports looking forward to getting back to [facility], can see MD (Dr. Emilia Beck) more often.  RN CM inquired of pt about transportation to MD visit on 4/24  to which pt reports thinks have a definite yes- friend of hers who lives here to bring her to both dentist and MD appointments.  Pt reports taking all of her medications, pain is okay  if she does not overdue it and if she does overdue it- next morning in pain for an hour.      Plan:  As discussed with pt, plan to follow up again next week telephonically after her MD office visit.    Shayne Alken.   Mildred Tuccillo RN CCM [facility] Care Management  [phone]

## 2017-04-07 ENCOUNTER — Other Ambulatory Visit: Payer: Self-pay | Admitting: *Deleted

## 2017-04-07 NOTE — Patient Outreach (Signed)
[facility]) Care Management  04/07/2017  Jessica Donovan 1946/09/11 [id number]   Phone call to patient to follow up on progress of identifying alternative options for housing and to provide her with ALF's identified in [facility] that will allow pets as well.   HIPPA compliant voicemail message left for a return call.   [name] [facility] Care Management [phone]

## 2017-04-08 ENCOUNTER — Other Ambulatory Visit: Payer: Self-pay | Admitting: *Deleted

## 2017-04-08 NOTE — Patient Outreach (Signed)
Transition of care call successful, ongoing follow up on recent discharge  from [facility] 3/23, admitted from hospital (recent hospitalization 3/6-3/12 for hypercarbia, renal insufficiency, fall, non traumatic rhabdomyolysis, opioid overdose- accidential or unintentional, hypotension.   Spoke with pt, HIPAA/identity verified.  Pt reports feeling better, stronger, have a big decision to make whether to move to [facility] or stay in [place]. Pt reports thinks it looks  good her moving to [place], can have her dog.   Pt reports was on the phone yesterday with [employer], going to find an Eye MD and Orthopedic MD (sciatic pain) in [place] that  are in network.  Pt reports she was informed Dr. Emilia Beck is not in network, would have a $40 co pay to which she  is still going to follow up on 4/24.  Pt reports she is going to wait until she moves to [facility] to find Primary Care MD  who is in network.  Pt reports no recent falls, dizziness a little better, taking all of her medications.  Pt reports sciatic pain managed with pain medication.     Plan: As discussed with pt, plan to follow up again next week telephonically(part of  Ongoing transition of care).    Shayne Alken.   Pierzchala RN CCM [facility] Care Management  [phone]

## 2017-04-13 DIAGNOSIS — D539 Nutritional anemia, unspecified: Secondary | ICD-10-CM | POA: Diagnosis not present

## 2017-04-13 DIAGNOSIS — F331 Major depressive disorder, recurrent, moderate: Secondary | ICD-10-CM | POA: Diagnosis not present

## 2017-04-13 DIAGNOSIS — M545 Low back pain: Secondary | ICD-10-CM | POA: Diagnosis not present

## 2017-04-13 DIAGNOSIS — E1165 Type 2 diabetes mellitus with hyperglycemia: Secondary | ICD-10-CM | POA: Diagnosis not present

## 2017-04-13 DIAGNOSIS — I2510 Atherosclerotic heart disease of native coronary artery without angina pectoris: Secondary | ICD-10-CM | POA: Diagnosis not present

## 2017-04-13 DIAGNOSIS — E538 Deficiency of other specified B group vitamins: Secondary | ICD-10-CM | POA: Diagnosis not present

## 2017-04-13 DIAGNOSIS — G4733 Obstructive sleep apnea (adult) (pediatric): Secondary | ICD-10-CM | POA: Diagnosis not present

## 2017-04-13 DIAGNOSIS — F411 Generalized anxiety disorder: Secondary | ICD-10-CM | POA: Diagnosis not present

## 2017-04-14 ENCOUNTER — Other Ambulatory Visit: Payer: Self-pay | Admitting: *Deleted

## 2017-04-14 NOTE — Patient Outreach (Signed)
Attempt made to contact pt as part of ongoing transition of care- recent discharge from [facility] 3/23, admitted from hospital (3/6-3/12 for hypercarbia, renal insufficiency, fall, non traumatic rhabdomyolysis, opioid overdose- accidental or unintentional, hypotension).  HIPAA compliant voice message left with contact number.    Plan: If no response to voice message left, to call again within 2 days.   Shayne Alken.   Tinie Mcgloin RN CCM [facility] Care Management  [phone]

## 2017-04-16 ENCOUNTER — Other Ambulatory Visit: Payer: Self-pay | Admitting: *Deleted

## 2017-04-16 NOTE — Patient Outreach (Signed)
Second attempt made to contact pt as part of ongoing transition of care, follow up on recent discharge from [facility] 3/23, admitted from hospital (3/6- 3/12 for hypercarbia,renal insufficiency, fall, non traumatic rhabdomyolysis, opioid overdose-accidental or unintentional, hypotension).  During phone call attempt, received a text from pt can't talk now, call me later.     Plan:  RN CM to follow up again later today.     Shayne Alken.   Taleya Whitcher RN CCM [facility] Care Management  [phone]

## 2017-04-16 NOTE — Patient Outreach (Signed)
Another attempt made to contact pt for ongoing transition of care as  earlier attempt today received a text message from pt can't talk now, call me later.  HIPAA compliant voice message left with contact name and number.  Plan:  If no response, plan to follow up again next week - final transition of care call.    Shayne Alken.   Pierzchala RN CCM [facility] Care Management  [phone]

## 2017-04-16 NOTE — Patient Outreach (Signed)
[facility]) Care Management  04/16/2017  Isaac Lacson 12-09-1946 [id number]   Phone call to patient to follow up on housing options that would accommodate pets.  HIPPA compliant voicemail message left requesting a return call.     [name] [facility] Care Management [phone]

## 2017-04-19 ENCOUNTER — Other Ambulatory Visit: Payer: Self-pay | Admitting: *Deleted

## 2017-04-19 NOTE — Patient Outreach (Signed)
Final transition of care call completed, ongoing follow up on recent discharge from [facility] 3/23, admitted from hospital 3/05-24-11 for hypercarbia, renal insufficiency, non traumatic rhabdomyolysis, opioid overdose- accidental or unintentional, hypotension).  Spoke with pt, HIPAA/identity verified.  Pt reports today not a good day,dealing with sciatic nerve pain.  Pt reports on recent visit with Dr. Emilia Beck, MD decreased her Morphine and Xanax to twice a day, continue to take Lyrica three times a day.  Pt reports decrease in pain medication not really working, pain unbearable/sciatic in left hip.  Pt reports to follow up again with MD in a couple of months.  Pt reports no falls, dizziness better, taking all medications as ordered.  Pt reports on housing-  still waiting for an  opening in [place].  RN CM discussed with pt  Chrystal  [facility] LCSW's  attempts to contact her, to relay to [place] to  call her after today.  Also discussed with pt today is  final transition of care call, will follow up again next month telephonically check on her condition.    Shayne Alken.   Jawuan Robb RN CCM [facility] Care Management  [phone]

## 2017-04-20 ENCOUNTER — Other Ambulatory Visit: Payer: Self-pay | Admitting: *Deleted

## 2017-04-20 ENCOUNTER — Encounter: Payer: Self-pay | Admitting: *Deleted

## 2017-04-20 NOTE — Patient Outreach (Signed)
[facility]) Care Management  04/20/2017  Jelicia Nantz 20-Aug-1946 [id number]   Follow up phone call to patient to provide her with ALF's that accepted pets. [facility] psychotherapist provided patient with 2 names of facilities that accepts pet.  Contact information for [facility] [phone] provided as well as [facility] [phone]. Patient appreciated information provided stating that she plans to move back to [place] as soon as a space opens up for her at [facility] in [place]. Patient states that she  is currently on the waiting list and should be moved back there by the end of the summer. Benefits of ALF care discussed. Patient's safety emphasized. Patient discussed having back pain and has contacted her doctor and is waiting on a return call. Patient verbalized no additional community resource needs.  Patient to be closed to social work at this time.    [name] [facility] Care Management [phone]

## 2017-04-26 DIAGNOSIS — M25552 Pain in left hip: Secondary | ICD-10-CM | POA: Diagnosis not present

## 2017-04-26 DIAGNOSIS — M545 Low back pain: Secondary | ICD-10-CM | POA: Diagnosis not present

## 2017-04-26 DIAGNOSIS — M25512 Pain in left shoulder: Secondary | ICD-10-CM | POA: Diagnosis not present

## 2017-05-01 DIAGNOSIS — M545 Low back pain: Secondary | ICD-10-CM | POA: Diagnosis not present

## 2017-05-11 IMAGING — DX DG FOOT COMPLETE 3+V*R*
3 series · 3 of 3 positions shown · non-contrast
Comparison: None.

CLINICAL DATA: Right foot pain and swelling. Clinical concern for
osteomyelitis.

EXAM:
RIGHT FOOT COMPLETE - 3+ VIEW

[foot ap]
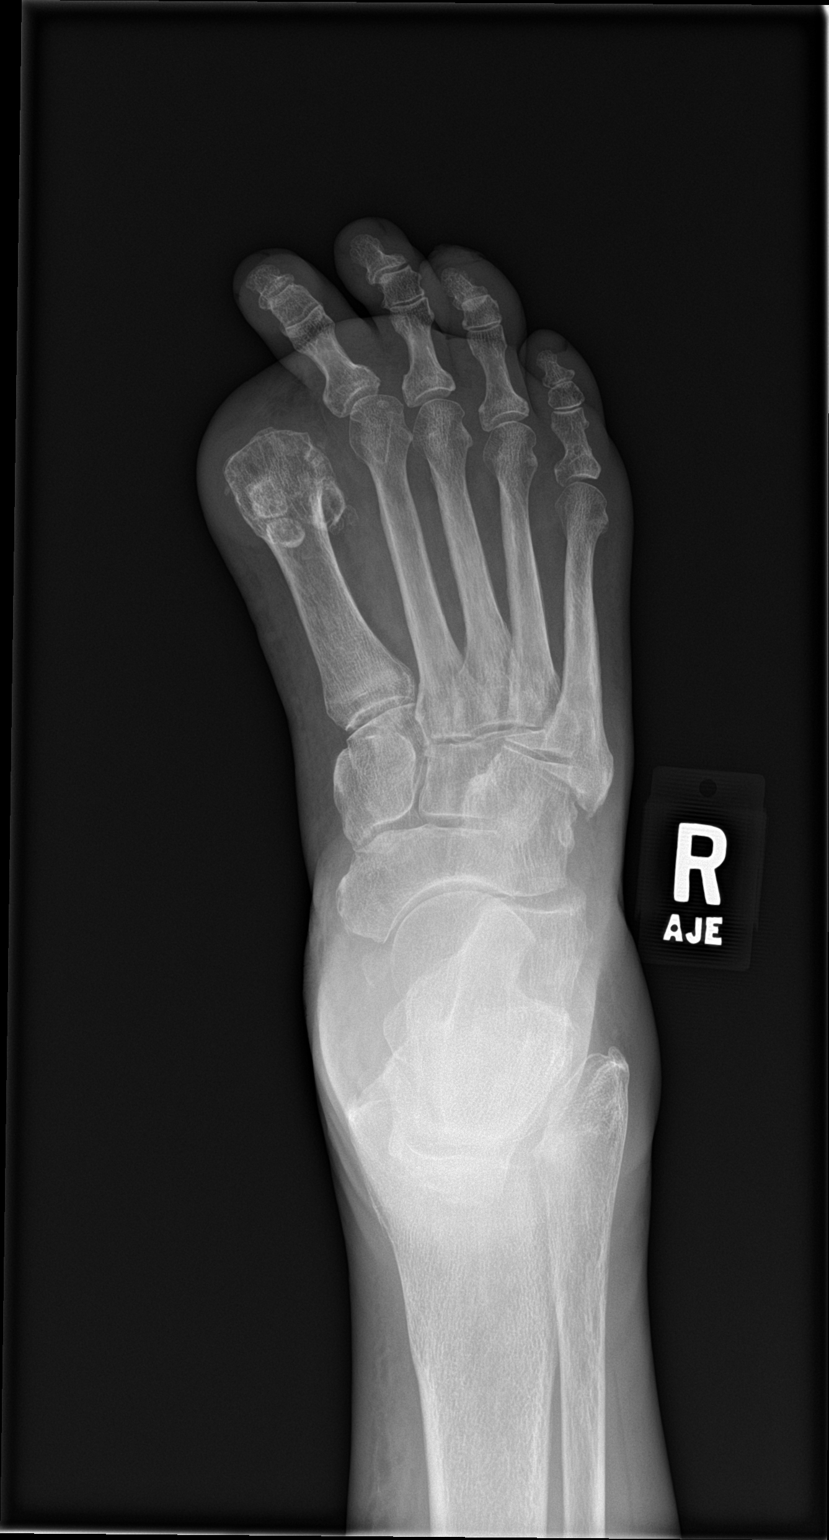

[foot obl]
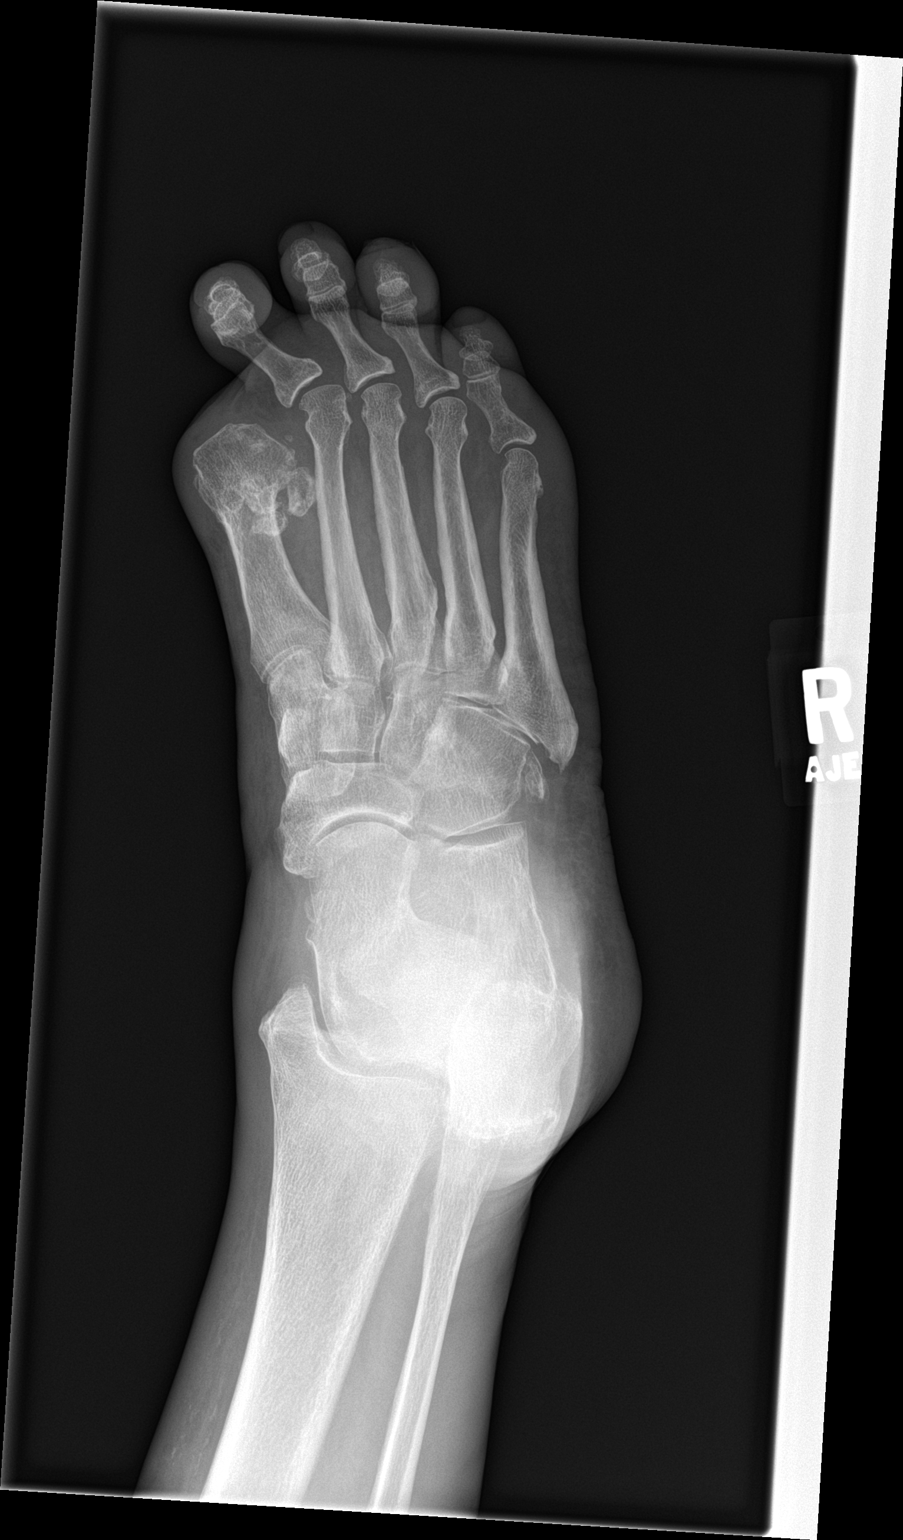

[foot lat]
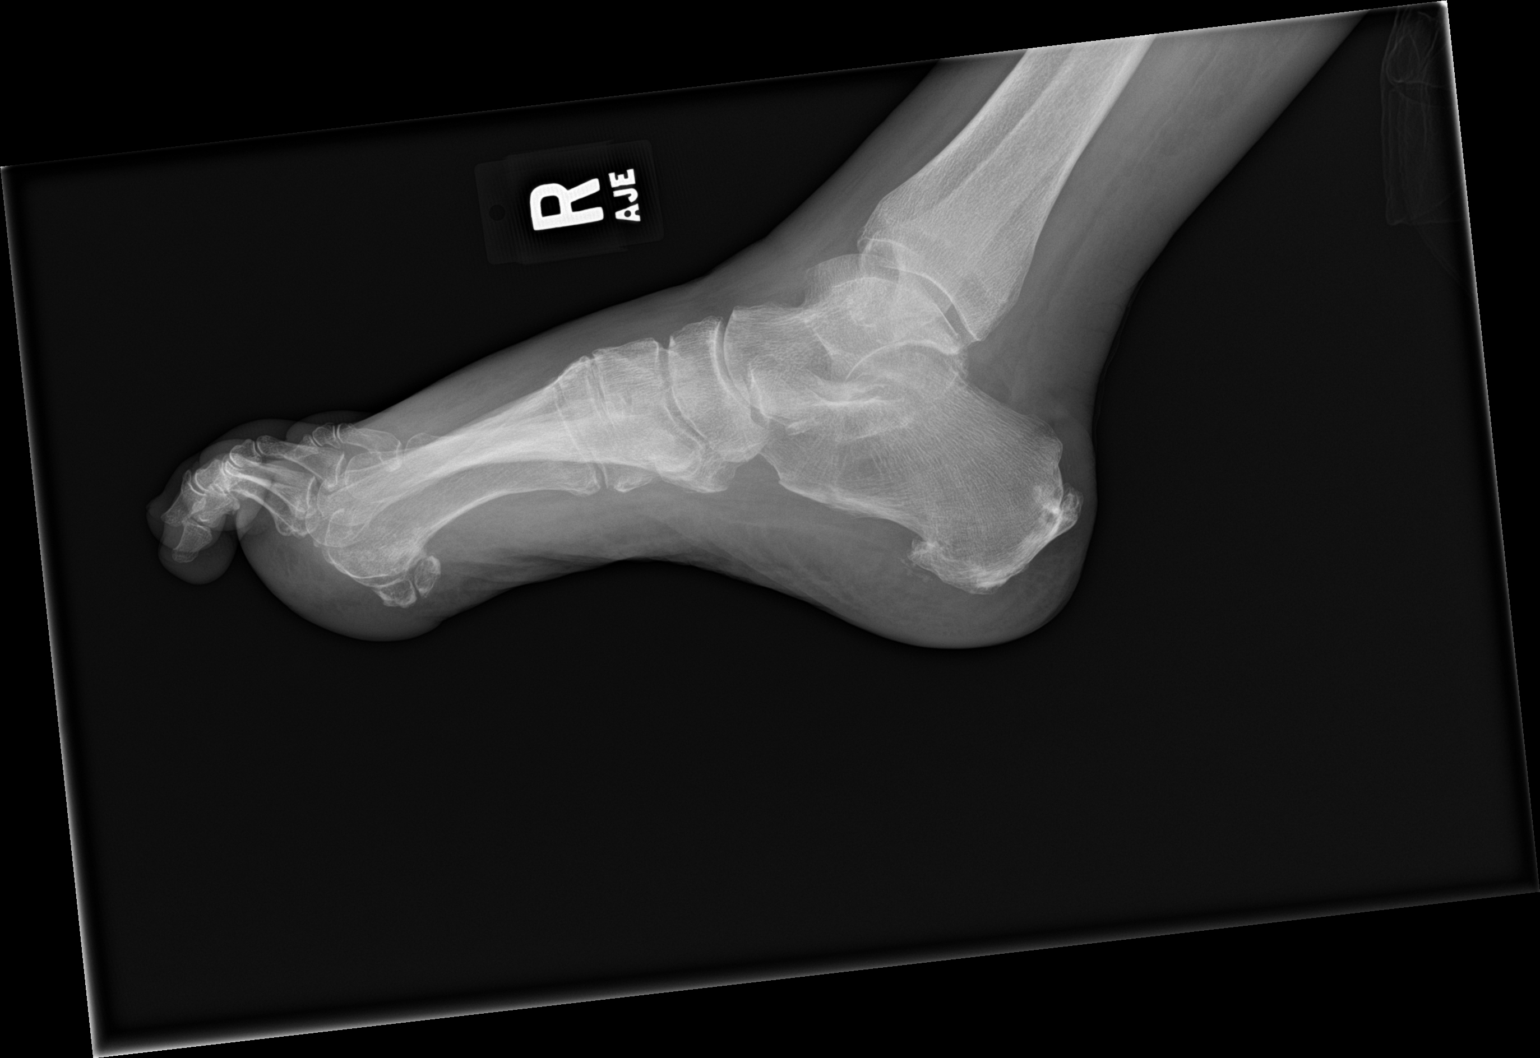

[3 of 3 positions shown; findings below may reference images not displayed]

FINDINGS: Surgical absence of the great toe. Diffuse soft tissue swelling. No
soft tissue gas, bone destruction or periosteal reaction. Calcaneal
spurs.
IMPRESSION: Diffuse soft tissue swelling without radiographic evidence of
underlying osteomyelitis.

## 2017-05-16 IMAGING — CT CT HEAD W/O CM
4 of 7 series · 16 of 47 positions shown, 19 images · non-contrast
Comparison: 02/24/2016.  02/23/2016.

CLINICAL DATA: Fell to the floor yesterday.  Weakness.

EXAM:
CT HEAD WITHOUT CONTRAST
CT CERVICAL SPINE WITHOUT CONTRAST
TECHNIQUE: Multidetector CT imaging of the head and cervical spine was
performed following the standard protocol without intravenous
contrast. Multiplanar CT image reconstructions of the cervical spine
were also generated.

[Series 7: c spine soft · axial · 0.31mm/px · z∈[-278,-256]mm · 2 of 81 slices shown]
[im 12/81  brain]
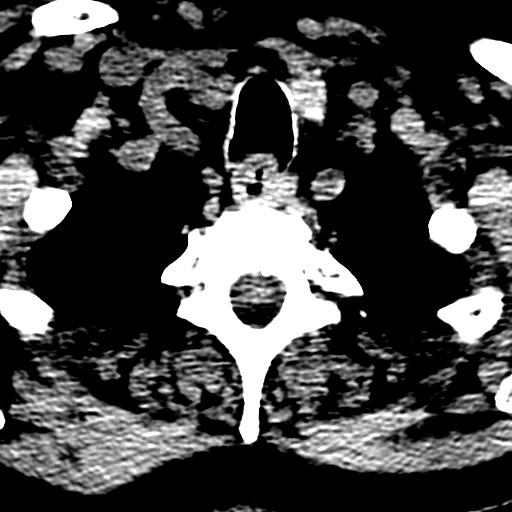
[im 23/81  brain]
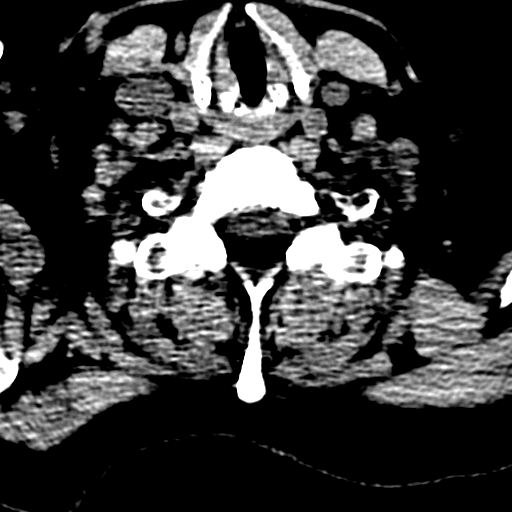

[Series 8: sag bone · sagittal · 0.36mm/px · 5 of 44 slices shown, 6 images]
[im 8/44  bone]
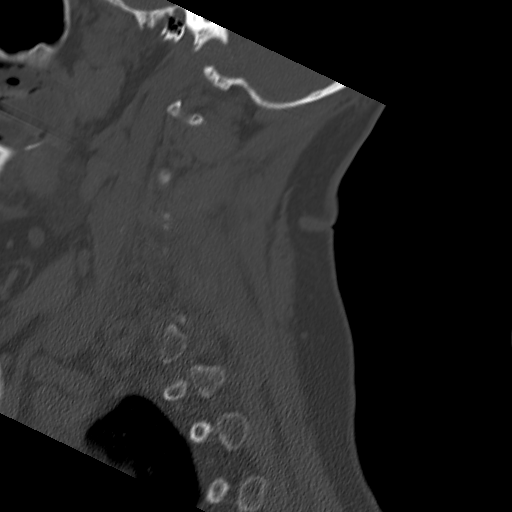
[im 15/44  bone]
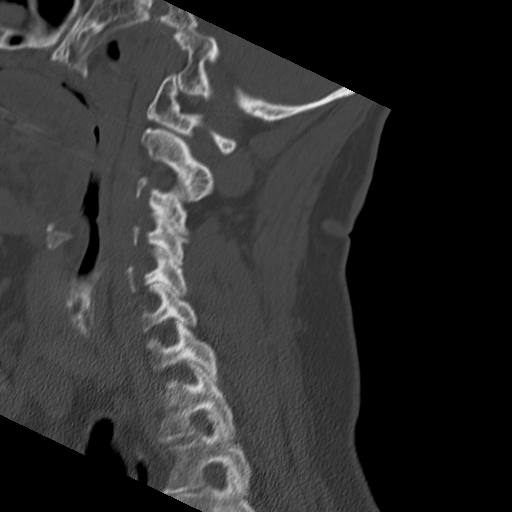
[im 22/44  brain]
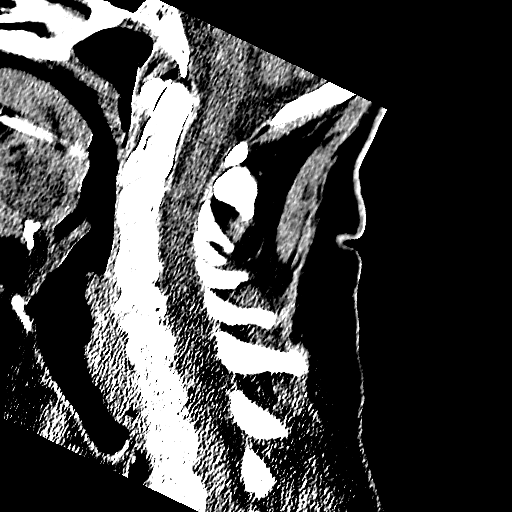
[im 22/44  bone]
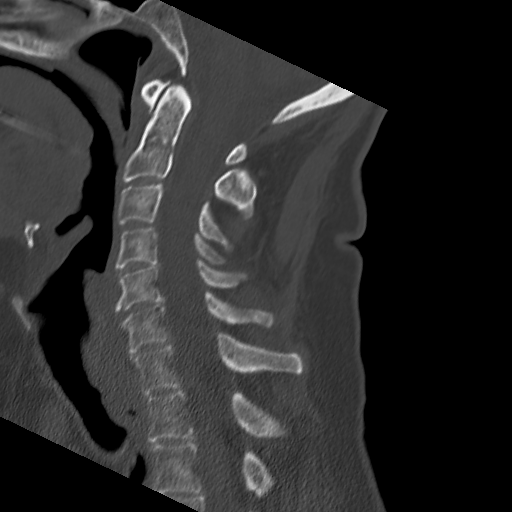
[im 29/44  bone]
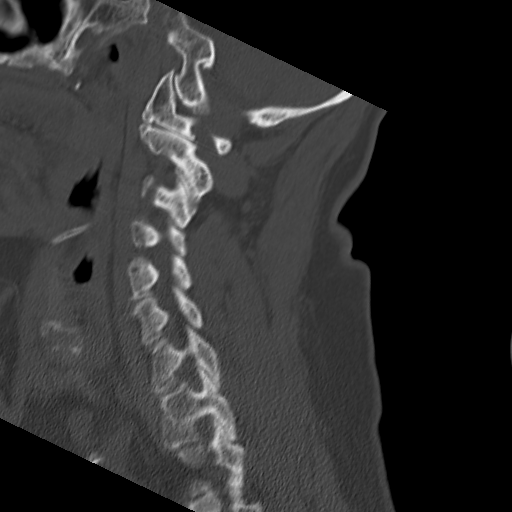
[im 36/44  bone]
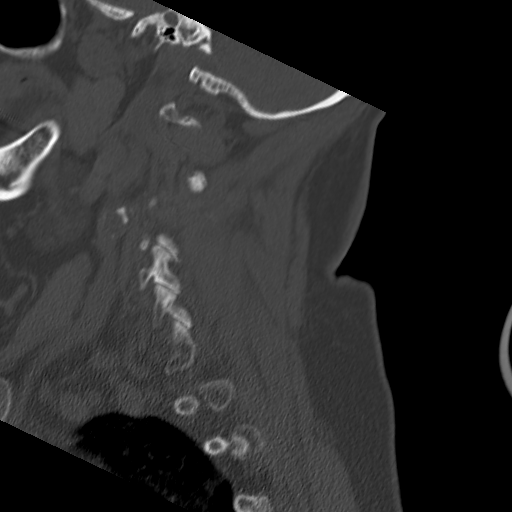

[Series 9: cor bone · coronal · 0.34mm/px · 3 of 48 slices shown]
[im 16/48  bone]
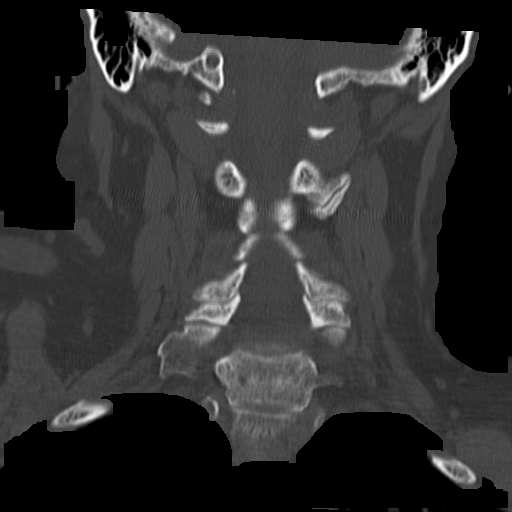
[im 21/48  bone]
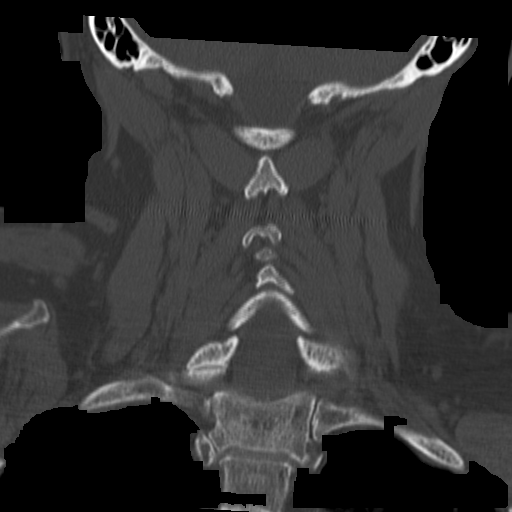
[im 27/48  bone]
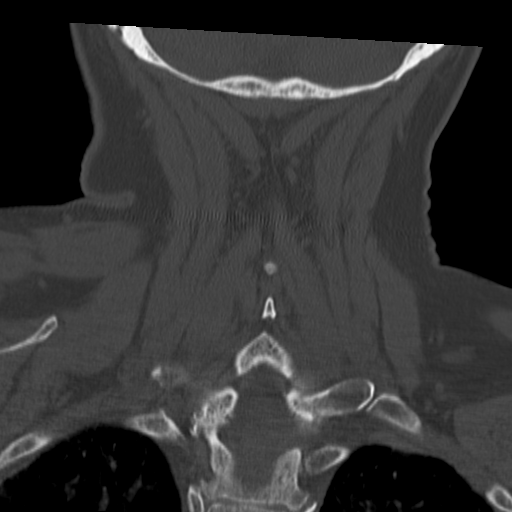

[Series 10: orthogonal axials · axial · 0.29mm/px · z∈[-303,-197]mm · 6 of 86 slices shown, 8 images]
[im 13/86  brain]
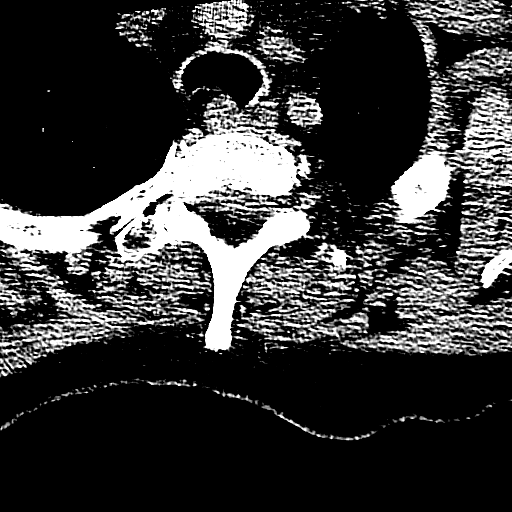
[im 13/86  bone]
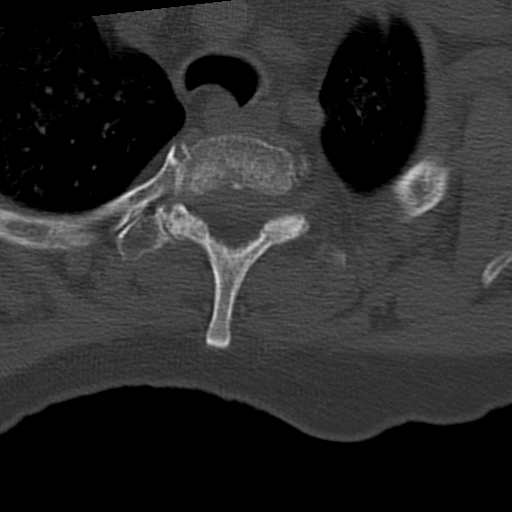
[im 25/86  bone]
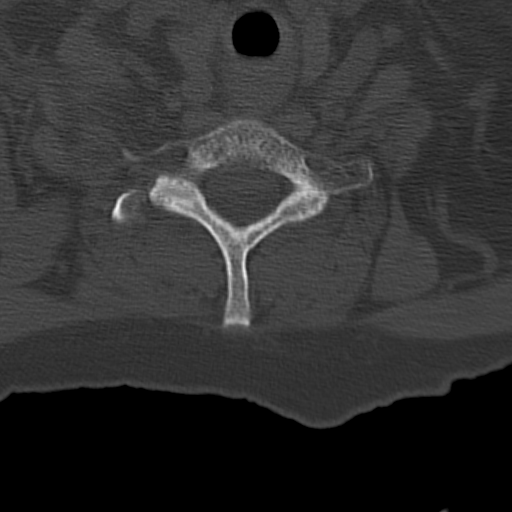
[im 37/86  bone]
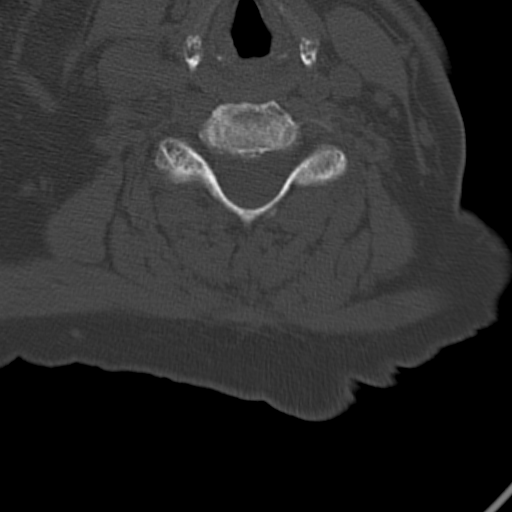
[im 49/86  bone]
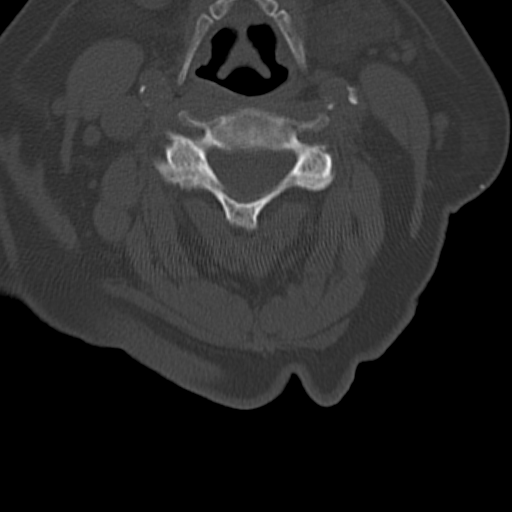
[im 61/86  brain]
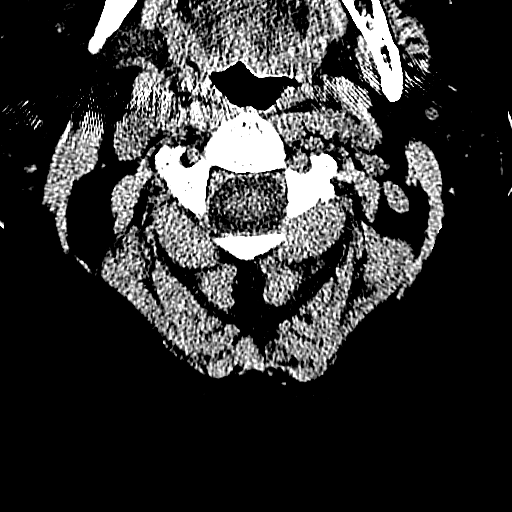
[im 61/86  bone]
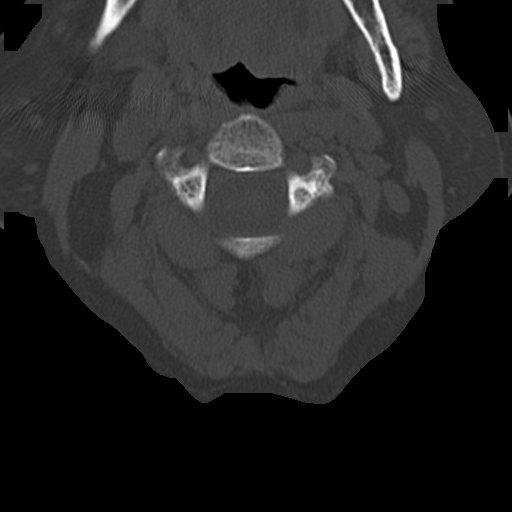
[im 73/86  bone]
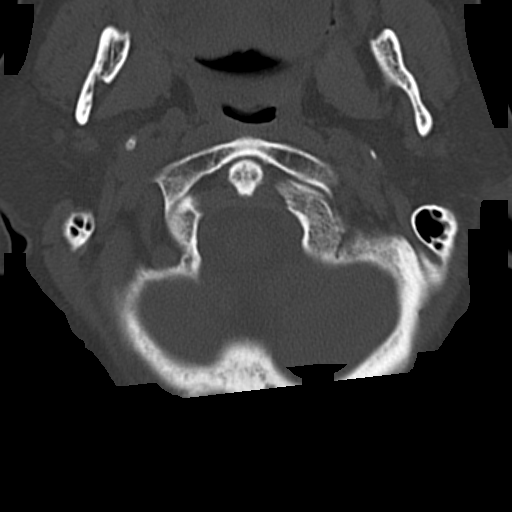

[16 of 47 positions shown; findings below may reference images not displayed]

FINDINGS: CT HEAD FINDINGS

The brain shows mild generalized atrophy. There is no evidence of
old or acute focal small or large vessel infarction. No mass lesion,
hemorrhage, hydrocephalus or extra-axial collection. The calvarium
is unremarkable. Sinuses, middle ears and mastoids are clear.

CT CERVICAL SPINE FINDINGS

No evidence of fracture or traumatic malalignment. There is facet
arthropathy on the right at C3-4 and on the left at C2-3. There is
ordinary degenerative spondylosis at C4-5 and C5-6.
IMPRESSION: Head CT:  Mild age related volume loss.  No focal finding.

Cervical spine CT: Mild facet degenerative change and spondylosis.
No acute or traumatic finding.

## 2017-05-19 ENCOUNTER — Other Ambulatory Visit: Payer: Self-pay | Admitting: *Deleted

## 2017-05-19 ENCOUNTER — Encounter: Payer: Self-pay | Admitting: Emergency Medicine

## 2017-05-19 ENCOUNTER — Inpatient Hospital Stay
Admission: EM | Admit: 2017-05-19 | Discharge: 2017-05-23 | DRG: 689 | Disposition: A | Discharge status: Home / Self Care | Payer: PPO | Attending: Internal Medicine | Admitting: Internal Medicine

## 2017-05-19 ENCOUNTER — Inpatient Hospital Stay: Payer: PPO

## 2017-05-19 ENCOUNTER — Emergency Department: Payer: PPO

## 2017-05-19 DIAGNOSIS — Z79891 Long term (current) use of opiate analgesic: Secondary | ICD-10-CM | POA: Diagnosis not present

## 2017-05-19 DIAGNOSIS — G8929 Other chronic pain: Secondary | ICD-10-CM | POA: Diagnosis present

## 2017-05-19 DIAGNOSIS — L0390 Cellulitis, unspecified: Secondary | ICD-10-CM | POA: Diagnosis not present

## 2017-05-19 DIAGNOSIS — F0390 Unspecified dementia without behavioral disturbance: Secondary | ICD-10-CM | POA: Diagnosis present

## 2017-05-19 DIAGNOSIS — N390 Urinary tract infection, site not specified: Secondary | ICD-10-CM | POA: Diagnosis present

## 2017-05-19 DIAGNOSIS — Z89422 Acquired absence of other left toe(s): Secondary | ICD-10-CM | POA: Diagnosis not present

## 2017-05-19 DIAGNOSIS — E872 Acidosis, unspecified: Secondary | ICD-10-CM

## 2017-05-19 DIAGNOSIS — E1165 Type 2 diabetes mellitus with hyperglycemia: Secondary | ICD-10-CM

## 2017-05-19 DIAGNOSIS — E039 Hypothyroidism, unspecified: Secondary | ICD-10-CM | POA: Diagnosis not present

## 2017-05-19 DIAGNOSIS — K529 Noninfective gastroenteritis and colitis, unspecified: Secondary | ICD-10-CM | POA: Diagnosis not present

## 2017-05-19 DIAGNOSIS — R296 Repeated falls: Secondary | ICD-10-CM | POA: Diagnosis present

## 2017-05-19 DIAGNOSIS — J9601 Acute respiratory failure with hypoxia: Secondary | ICD-10-CM | POA: Diagnosis not present

## 2017-05-19 DIAGNOSIS — L97811 Non-pressure chronic ulcer of other part of right lower leg limited to breakdown of skin: Secondary | ICD-10-CM | POA: Diagnosis not present

## 2017-05-19 DIAGNOSIS — I619 Nontraumatic intracerebral hemorrhage, unspecified: Secondary | ICD-10-CM | POA: Diagnosis not present

## 2017-05-19 DIAGNOSIS — E785 Hyperlipidemia, unspecified: Secondary | ICD-10-CM | POA: Diagnosis not present

## 2017-05-19 DIAGNOSIS — R52 Pain, unspecified: Secondary | ICD-10-CM | POA: Diagnosis not present

## 2017-05-19 DIAGNOSIS — L97309 Non-pressure chronic ulcer of unspecified ankle with unspecified severity: Secondary | ICD-10-CM

## 2017-05-19 DIAGNOSIS — G9341 Metabolic encephalopathy: Secondary | ICD-10-CM | POA: Diagnosis present

## 2017-05-19 DIAGNOSIS — I959 Hypotension, unspecified: Secondary | ICD-10-CM | POA: Diagnosis not present

## 2017-05-19 DIAGNOSIS — F1190 Opioid use, unspecified, uncomplicated: Secondary | ICD-10-CM | POA: Diagnosis present

## 2017-05-19 DIAGNOSIS — R109 Unspecified abdominal pain: Secondary | ICD-10-CM

## 2017-05-19 DIAGNOSIS — L03119 Cellulitis of unspecified part of limb: Secondary | ICD-10-CM

## 2017-05-19 DIAGNOSIS — L89519 Pressure ulcer of right ankle, unspecified stage: Secondary | ICD-10-CM | POA: Diagnosis not present

## 2017-05-19 DIAGNOSIS — I252 Old myocardial infarction: Secondary | ICD-10-CM

## 2017-05-19 DIAGNOSIS — Z79899 Other long term (current) drug therapy: Secondary | ICD-10-CM | POA: Diagnosis not present

## 2017-05-19 DIAGNOSIS — Z794 Long term (current) use of insulin: Secondary | ICD-10-CM | POA: Diagnosis not present

## 2017-05-19 DIAGNOSIS — Z66 Do not resuscitate: Secondary | ICD-10-CM | POA: Diagnosis present

## 2017-05-19 DIAGNOSIS — I2510 Atherosclerotic heart disease of native coronary artery without angina pectoris: Secondary | ICD-10-CM | POA: Diagnosis not present

## 2017-05-19 DIAGNOSIS — E11622 Type 2 diabetes mellitus with other skin ulcer: Secondary | ICD-10-CM | POA: Diagnosis present

## 2017-05-19 DIAGNOSIS — N3001 Acute cystitis with hematuria: Secondary | ICD-10-CM | POA: Diagnosis not present

## 2017-05-19 DIAGNOSIS — Z7982 Long term (current) use of aspirin: Secondary | ICD-10-CM

## 2017-05-19 DIAGNOSIS — I10 Essential (primary) hypertension: Secondary | ICD-10-CM | POA: Diagnosis present

## 2017-05-19 DIAGNOSIS — IMO0002 Reserved for concepts with insufficient information to code with codable children: Secondary | ICD-10-CM

## 2017-05-19 DIAGNOSIS — R531 Weakness: Secondary | ICD-10-CM | POA: Diagnosis not present

## 2017-05-19 DIAGNOSIS — N3000 Acute cystitis without hematuria: Secondary | ICD-10-CM | POA: Diagnosis not present

## 2017-05-19 DIAGNOSIS — L97319 Non-pressure chronic ulcer of right ankle with unspecified severity: Secondary | ICD-10-CM | POA: Diagnosis not present

## 2017-05-19 DIAGNOSIS — Z8673 Personal history of transient ischemic attack (TIA), and cerebral infarction without residual deficits: Secondary | ICD-10-CM

## 2017-05-19 DIAGNOSIS — E11628 Type 2 diabetes mellitus with other skin complications: Secondary | ICD-10-CM | POA: Diagnosis present

## 2017-05-19 DIAGNOSIS — R4182 Altered mental status, unspecified: Secondary | ICD-10-CM | POA: Diagnosis not present

## 2017-05-19 DIAGNOSIS — R0902 Hypoxemia: Secondary | ICD-10-CM | POA: Diagnosis not present

## 2017-05-19 DIAGNOSIS — E119 Type 2 diabetes mellitus without complications: Secondary | ICD-10-CM | POA: Diagnosis not present

## 2017-05-19 DIAGNOSIS — W19XXXA Unspecified fall, initial encounter: Secondary | ICD-10-CM | POA: Diagnosis not present

## 2017-05-19 DIAGNOSIS — R197 Diarrhea, unspecified: Secondary | ICD-10-CM | POA: Diagnosis not present

## 2017-05-19 DIAGNOSIS — G9340 Encephalopathy, unspecified: Secondary | ICD-10-CM | POA: Diagnosis not present

## 2017-05-19 LAB — COMPREHENSIVE METABOLIC PANEL
ALBUMIN: 3.9 g/dL (ref 3.5–5.0)
ALT: 45 U/L (ref 14–54)
ANION GAP: 10 (ref 5–15)
AST: 163 U/L — AB (ref 15–41)
Alkaline Phosphatase: 87 U/L (ref 38–126)
BILIRUBIN TOTAL: 0.5 mg/dL (ref 0.3–1.2)
BUN: 44 mg/dL — AB (ref 6–20)
CHLORIDE: 104 mmol/L (ref 101–111)
CO2: 24 mmol/L (ref 22–32)
Calcium: 9.2 mg/dL (ref 8.9–10.3)
Creatinine, Ser: 1.05 mg/dL — ABNORMAL HIGH (ref 0.44–1.00)
GFR calc Af Amer: >60 mL/min (ref >60)
GFR, EST NON AFRICAN AMERICAN: 53 mL/min — AB (ref >60)
GLUCOSE: 275 mg/dL — AB (ref 65–99)
POTASSIUM: 4.4 mmol/L (ref 3.5–5.1)
Sodium: 138 mmol/L (ref 135–145)
TOTAL PROTEIN: 6.6 g/dL (ref 6.5–8.1)

## 2017-05-19 LAB — CBC WITH DIFFERENTIAL/PLATELET
BASOS ABS: 0 10*3/uL (ref 0–0.1)
BASOS PCT: 0 %
EOS ABS: 0 10*3/uL (ref 0–0.7)
Eosinophils Relative: 0 %
HCT: 38 % (ref 35.0–47.0)
Hemoglobin: 12.7 g/dL (ref 12.0–16.0)
Lymphocytes Relative: 10 %
Lymphs Abs: 0.9 10*3/uL — ABNORMAL LOW (ref 1.0–3.6)
MCH: 32.3 pg (ref 26.0–34.0)
MCHC: 33.5 g/dL (ref 32.0–36.0)
MCV: 96.3 fL (ref 80.0–100.0)
MONO ABS: 0.7 10*3/uL (ref 0.2–0.9)
MONOS PCT: 8 %
Neutro Abs: 7.5 10*3/uL — ABNORMAL HIGH (ref 1.4–6.5)
Neutrophils Relative %: 82 %
PLATELETS: 224 10*3/uL (ref 150–440)
RBC: 3.94 MIL/uL (ref 3.80–5.20)
RDW: 14.1 % (ref 11.5–14.5)
WBC: 9.2 10*3/uL (ref 3.6–11.0)

## 2017-05-19 LAB — BLOOD GAS, VENOUS
Acid-base deficit: 0.8 mmol/L (ref 0.0–2.0)
Bicarbonate: 25.4 mmol/L (ref 20.0–28.0)
O2 Saturation: 68.1 %
PH VEN: 7.34 (ref 7.250–7.430)
PO2 VEN: 38 mmHg (ref 32.0–45.0)
Patient temperature: 37
pCO2, Ven: 47 mmHg (ref 44.0–60.0)

## 2017-05-19 LAB — URINALYSIS, COMPLETE (UACMP) WITH MICROSCOPIC
BILIRUBIN URINE: NEGATIVE
GLUCOSE, UA: 50 mg/dL — AB
Ketones, ur: 5 mg/dL — AB
NITRITE: NEGATIVE
Protein, ur: NEGATIVE mg/dL
SPECIFIC GRAVITY, URINE: 1.014 (ref 1.005–1.030)
Squamous Epithelial / LPF: NONE SEEN
pH: 5 (ref 5.0–8.0)

## 2017-05-19 LAB — LACTIC ACID, PLASMA
LACTIC ACID, VENOUS: 3.4 mmol/L — AB (ref 0.5–1.9)
LACTIC ACID, VENOUS: 2.7 mmol/L — AB (ref 0.5–1.9)

## 2017-05-19 LAB — TROPONIN I

## 2017-05-19 LAB — URINE DRUG SCREEN, QUALITATIVE (ARMC ONLY)
Amphetamines, Ur Screen: NOT DETECTED
BARBITURATES, UR SCREEN: NOT DETECTED
Benzodiazepine, Ur Scrn: POSITIVE — AB
CANNABINOID 50 NG, UR ~~LOC~~: NOT DETECTED
COCAINE METABOLITE, UR ~~LOC~~: NOT DETECTED
MDMA (ECSTASY) UR SCREEN: NOT DETECTED
Methadone Scn, Ur: NOT DETECTED
OPIATE, UR SCREEN: POSITIVE — AB
PHENCYCLIDINE (PCP) UR S: NOT DETECTED
Tricyclic, Ur Screen: POSITIVE — AB

## 2017-05-19 LAB — GLUCOSE, CAPILLARY: Glucose-Capillary: 212 mg/dL — ABNORMAL HIGH (ref 65–99)

## 2017-05-19 MED ORDER — ACETAMINOPHEN 325 MG PO TABS
650.0000 mg | ORAL_TABLET | Freq: Four times a day (QID) | ORAL | Status: DC | PRN
  Administered 2017-05-20 – 2017-05-22 (×2): 650 mg via ORAL
  Filled 2017-05-19 (×2): qty 2

## 2017-05-19 MED ORDER — ALPRAZOLAM 0.5 MG PO TABS: 0.5000 mg | ORAL_TABLET | Freq: Two times a day (BID) | ORAL | Status: DC

## 2017-05-19 MED ORDER — SODIUM CHLORIDE 0.9 % IV SOLN
INTRAVENOUS | Status: AC
  Administered 2017-05-19: 22:00:00 via INTRAVENOUS

## 2017-05-19 MED ORDER — IPRATROPIUM-ALBUTEROL 0.5-2.5 (3) MG/3ML IN SOLN
3.0000 mL | Freq: Once | RESPIRATORY_TRACT | Status: AC
  Administered 2017-05-19: 3 mL via RESPIRATORY_TRACT
  Filled 2017-05-19: qty 3

## 2017-05-19 MED ORDER — INSULIN ASPART 100 UNIT/ML ~~LOC~~ SOLN
0.0000 [IU] | Freq: Three times a day (TID) | SUBCUTANEOUS | Status: DC
  Administered 2017-05-19: 3 [IU] via SUBCUTANEOUS
  Administered 2017-05-20: 5 [IU] via SUBCUTANEOUS
  Administered 2017-05-20: 1 [IU] via SUBCUTANEOUS
  Administered 2017-05-20: 2 [IU] via SUBCUTANEOUS
  Administered 2017-05-21 – 2017-05-22 (×5): 1 [IU] via SUBCUTANEOUS
  Administered 2017-05-22 – 2017-05-23 (×2): 3 [IU] via SUBCUTANEOUS
  Filled 2017-05-19: qty 3
  Filled 2017-05-19: qty 1
  Filled 2017-05-19: qty 2
  Filled 2017-05-19 (×2): qty 1
  Filled 2017-05-19: qty 2
  Filled 2017-05-19 (×2): qty 1
  Filled 2017-05-19: qty 5
  Filled 2017-05-19: qty 1
  Filled 2017-05-19: qty 3

## 2017-05-19 MED ORDER — ACETAMINOPHEN 650 MG RE SUPP: 650.0000 mg | Freq: Four times a day (QID) | RECTAL | Status: DC | PRN

## 2017-05-19 MED ORDER — PANTOPRAZOLE SODIUM 40 MG PO TBEC
40.0000 mg | DELAYED_RELEASE_TABLET | Freq: Every day | ORAL | Status: DC
  Administered 2017-05-20 – 2017-05-23 (×4): 40 mg via ORAL
  Filled 2017-05-19 (×4): qty 1

## 2017-05-19 MED ORDER — VANCOMYCIN HCL 10 G IV SOLR
1250.0000 mg | Freq: Once | INTRAVENOUS | Status: AC
  Administered 2017-05-19: 1250 mg via INTRAVENOUS
  Filled 2017-05-19: qty 1250

## 2017-05-19 MED ORDER — VANCOMYCIN HCL 10 G IV SOLR
1250.0000 mg | INTRAVENOUS | Status: DC
  Filled 2017-05-19: qty 1250

## 2017-05-19 MED ORDER — PIPERACILLIN-TAZOBACTAM 3.375 G IVPB
3.3750 g | Freq: Three times a day (TID) | INTRAVENOUS | Status: DC
Start: 2017-05-19 — End: 2017-05-20
  Administered 2017-05-19 – 2017-05-20 (×2): 3.375 g via INTRAVENOUS
  Filled 2017-05-19 (×4): qty 50

## 2017-05-19 MED ORDER — SODIUM CHLORIDE 0.9 % IV BOLUS (SEPSIS)
500.0000 mL | Freq: Once | INTRAVENOUS | Status: AC
  Administered 2017-05-19: 500 mL via INTRAVENOUS

## 2017-05-19 MED ORDER — NALOXONE HCL 0.4 MG/ML IJ SOLN: 0.2000 mg | Freq: Once | INTRAMUSCULAR | Status: DC

## 2017-05-19 MED ORDER — ONDANSETRON HCL 4 MG PO TABS: 4.0000 mg | ORAL_TABLET | Freq: Four times a day (QID) | ORAL | Status: DC | PRN

## 2017-05-19 MED ORDER — IBUPROFEN 400 MG PO TABS
400.0000 mg | ORAL_TABLET | Freq: Four times a day (QID) | ORAL | Status: DC | PRN
  Administered 2017-05-19 – 2017-05-20 (×2): 400 mg via ORAL
  Filled 2017-05-19 (×3): qty 1

## 2017-05-19 MED ORDER — IPRATROPIUM-ALBUTEROL 0.5-2.5 (3) MG/3ML IN SOLN: 3.0000 mL | RESPIRATORY_TRACT | Status: DC | PRN

## 2017-05-19 MED ORDER — ASPIRIN EC 81 MG PO TBEC
81.0000 mg | DELAYED_RELEASE_TABLET | Freq: Every day | ORAL | Status: DC
  Administered 2017-05-20 – 2017-05-23 (×4): 81 mg via ORAL
  Filled 2017-05-19 (×4): qty 1

## 2017-05-19 MED ORDER — IOPAMIDOL (ISOVUE-370) INJECTION 76%
75.0000 mL | Freq: Once | INTRAVENOUS | Status: AC | PRN
  Administered 2017-05-19: 75 mL via INTRAVENOUS

## 2017-05-19 MED ORDER — ENOXAPARIN SODIUM 40 MG/0.4ML ~~LOC~~ SOLN
40.0000 mg | SUBCUTANEOUS | Status: DC
  Administered 2017-05-19 – 2017-05-22 (×4): 40 mg via SUBCUTANEOUS
  Filled 2017-05-19 (×4): qty 0.4

## 2017-05-19 MED ORDER — PREGABALIN 75 MG PO CAPS
150.0000 mg | ORAL_CAPSULE | Freq: Two times a day (BID) | ORAL | Status: DC
  Administered 2017-05-20 – 2017-05-23 (×6): 150 mg via ORAL
  Filled 2017-05-19 (×6): qty 2

## 2017-05-19 MED ORDER — CEFTRIAXONE SODIUM IN DEXTROSE 20 MG/ML IV SOLN
1.0000 g | Freq: Once | INTRAVENOUS | Status: AC
  Administered 2017-05-19: 1 g via INTRAVENOUS
  Filled 2017-05-19: qty 50

## 2017-05-19 MED ORDER — VENLAFAXINE HCL ER 75 MG PO CP24
150.0000 mg | ORAL_CAPSULE | Freq: Every day | ORAL | Status: DC
  Administered 2017-05-20 – 2017-05-21 (×2): 150 mg via ORAL
  Filled 2017-05-19 (×2): qty 2

## 2017-05-19 MED ORDER — NALOXONE HCL 0.4 MG/ML IJ SOLN
0.2000 mg | Freq: Once | INTRAMUSCULAR | Status: DC
  Filled 2017-05-19 (×2): qty 1

## 2017-05-19 MED ORDER — NALOXONE HCL 2 MG/2ML IJ SOSY
0.4000 mg | PREFILLED_SYRINGE | Freq: Once | INTRAMUSCULAR | Status: AC
  Administered 2017-05-19: 0.4 mg via INTRAVENOUS
  Filled 2017-05-19: qty 2

## 2017-05-19 MED ORDER — NALOXONE HCL 0.4 MG/ML IJ SOLN
0.2000 mg | Freq: Once | INTRAMUSCULAR | Status: AC
  Administered 2017-05-19: 0.2 mg via INTRAVENOUS
  Filled 2017-05-19: qty 1

## 2017-05-19 MED ORDER — ATORVASTATIN CALCIUM 20 MG PO TABS
20.0000 mg | ORAL_TABLET | Freq: Every day | ORAL | Status: DC
  Administered 2017-05-20 – 2017-05-22 (×3): 20 mg via ORAL
  Filled 2017-05-19 (×3): qty 1

## 2017-05-19 MED ORDER — ONDANSETRON HCL 4 MG/2ML IJ SOLN
4.0000 mg | Freq: Four times a day (QID) | INTRAMUSCULAR | Status: DC | PRN
  Administered 2017-05-21: 4 mg via INTRAVENOUS
  Filled 2017-05-19: qty 2

## 2017-05-19 MED ORDER — LIOTHYRONINE SODIUM 25 MCG PO TABS
25.0000 ug | ORAL_TABLET | Freq: Every day | ORAL | Status: DC
  Administered 2017-05-20 – 2017-05-21 (×2): 25 ug via ORAL
  Filled 2017-05-19 (×3): qty 1

## 2017-05-19 NOTE — Progress Notes (Signed)
ANTIBIOTIC CONSULT NOTE - INITIAL  Pharmacy Consult for Vancomycin, Zosyn  Indication: cellulitis  Allergies  Allergen Reactions  . Codeine Nausea And Vomiting  . Influenza Vaccines Other (See Comments)    Reaction:  Caused pt to pass out   . Methadone Hives and Itching  . Percocet [Oxycodone-Acetaminophen] Nausea And Vomiting  . Tetanus Toxoids Swelling and Other (See Comments)    Reaction:  Swelling at injection site  . Tetracyclines & Related Rash    Patient Measurements: Weight: 191 lb (86.6 kg) Adjusted Body Weight: 68.8 kg   Vital Signs: Temp: 98.1 F (36.7 C) (05/30 2158) Temp Source: Oral (05/30 2158) BP: 133/49 (05/30 2158) Pulse Rate: 86 (05/30 2158) Intake/Output from previous day: No intake/output data recorded. Intake/Output from this shift: No intake/output data recorded.  Labs:  Recent Labs  05/19/17 1803  WBC 9.2  HGB 12.7  PLT 224  CREATININE 1.05*   Estimated Creatinine Clearance: 54.1 mL/min (A) (by C-G formula based on SCr of 1.05 mg/dL (H)). No results for input(s): VANCOTROUGH, VANCOPEAK, VANCORANDOM, GENTTROUGH, GENTPEAK, GENTRANDOM, TOBRATROUGH, TOBRAPEAK, TOBRARND, AMIKACINPEAK, AMIKACINTROU, AMIKACIN in the last 72 hours.   Microbiology: No results found for this or any previous visit (from the past 720 hour(s)).  Medical History: Past Medical History:  Diagnosis Date  . Chronic back pain   . Collagen vascular disease (HCC)   . Coronary artery disease   . Diabetes mellitus without complication (HCC)   . Hypertension   . Low back pain   . Myocardial infarction (HCC)   . Osteomyelitis of toe (HCC) 01/23/2016  . Stroke [facility])     Medications:  Prescriptions Prior to Admission  Medication Sig Dispense Refill Last Dose  . acetaminophen (TYLENOL) 500 MG tablet Take 1,000 mg by mouth every 4 (four) hours as needed for mild pain or headache.    PRN at PRN  . ALPRAZolam (XANAX) 1 MG tablet Take 1 mg by mouth 3 (three) times daily.     05/19/2017 at pm  . aspirin EC 81 MG tablet Take 81 mg by mouth daily.   05/16/2017 at am  . atorvastatin (LIPITOR) 20 MG tablet Take 20 mg by mouth at bedtime.    05/16/2017 at am  . Biotin 5 MG CAPS Take 5 mg by mouth daily.    05/16/2017 at am  . celecoxib (CELEBREX) 200 MG capsule Take 200 mg by mouth daily.    05/16/2017 at am  . cyclobenzaprine (FLEXERIL) 10 MG tablet Take 1 tablet (10 mg total) by mouth 3 (three) times daily as needed for muscle spasms. 20 tablet 0 PRN at PRN  . diphenoxylate-atropine (LOMOTIL) 2.5-0.025 MG tablet Take 1 tablet by mouth every 4 (four) hours as needed for diarrhea or loose stools.    PRN at PRN  . ferrous sulfate 325 (65 FE) MG tablet Take 325 mg by mouth daily with breakfast.   05/16/2017 at am  . fesoterodine (TOVIAZ) 8 MG TB24 tablet Take 8 mg by mouth daily. At bedtime   05/16/2017 at am  . furosemide (LASIX) 20 MG tablet Take 1 tablet (20 mg total) by mouth daily as needed. 30 tablet 0 05/19/2017 at am  . insulin aspart (NOVOLOG) 100 UNIT/ML injection Inject 3-15 Units into the skin 3 (three) times daily with meals as needed for high blood sugar. Pt uses as needed per sliding scale:    Less than 140:  0 units  140-180:  3 units 181-220:  4 units 221- 260:  6  units 261- 320:  8 units 321-360:  10 units 361-400:  12 units Greater than 400:  15 units   PRN at PRN  . Insulin Glargine (TOUJEO SOLOSTAR) 300 UNIT/ML SOPN Inject 30 Units into the skin daily. 1 pen 0 05/16/2017 at am  . isosorbide mononitrate (IMDUR) 30 MG 24 hr tablet Take 30 mg by mouth daily.    05/16/2017 at am  . liothyronine (CYTOMEL) 25 MCG tablet Take 25 mcg by mouth daily.   05/16/2017 at am  . lisinopril (PRINIVIL,ZESTRIL) 20 MG tablet Take 20 mg by mouth daily.   05/16/2017 at am  . metoprolol tartrate (LOPRESSOR) 25 MG tablet Take 25 mg by mouth 2 (two) times daily.   05/16/2017 at pm  . mirtazapine (REMERON) 15 MG tablet Take 15 mg by mouth at bedtime.   05/16/2017 at pm  . morphine (MS  CONTIN) 15 MG 12 hr tablet Take 1 tablet (15 mg total) by mouth every 12 (twelve) hours. (Patient taking differently: Take 15 mg by mouth 3 (three) times daily. ) 30 tablet 0 05/19/2017 at pm   . morphine (MSIR) 15 MG tablet Take 15 mg by mouth 3 (three) times daily as needed for severe pain.    PRN at PRN  . Multiple Vitamin (MULTIVITAMIN WITH MINERALS) TABS tablet Take 1 tablet by mouth daily.   05/16/2017 at am  . nitrofurantoin, macrocrystal-monohydrate, (MACROBID) 100 MG capsule Take 1 capsule (100 mg total) by mouth daily. 30 capsule 0 05/16/2017 at am  . nitroGLYCERIN (NITROSTAT) 0.4 MG SL tablet Place 0.4 mg under the tongue every 5 (five) minutes as needed for chest pain. Reported on 04/16/2016   PRN at PRN  . omeprazole (PRILOSEC) 20 MG capsule Take 20 mg by mouth daily.   05/16/2017 at am  . pregabalin (LYRICA) 150 MG capsule Take 1 capsule (150 mg total) by mouth 2 (two) times daily. (Patient taking differently: Take 150 mg by mouth 3 (three) times daily. )   05/19/2017 at pm  . promethazine (PHENERGAN) 25 MG tablet Take 25 mg by mouth every 6 (six) hours as needed for nausea.    PRN at PRN  . traZODone (DESYREL) 100 MG tablet Take 100 mg by mouth at bedtime.   05/16/2017 at pm  . venlafaxine (EFFEXOR) 75 MG tablet Take 225 mg by mouth daily.    05/16/2017 at am  . vitamin B-12 (CYANOCOBALAMIN) 1000 MCG tablet Take 1,000 mcg by mouth daily.   05/16/2017 at am  . Vitamin D, Ergocalciferol, (DRISDOL) 50000 units CAPS capsule Take 50,000 Units by mouth every Sunday. Pt takes on Sunday.    05/16/2017 at am   Assessment: CrCl = 54.2 ml/min Ke = 0.049 hr-1 Vd = 48.2 L  T1/2 = 14.14 hrs  Goal of Therapy:  Vancomycin trough level 10-15 mcg/ml  Plan:  Expected duration 7 days with resolution of temperature and/or normalization of WBC   Zosyn 3.375 gm IV Q8H EI ordered to start on 5/30.  Vancomycin 1250 mg IV X 1 ordered to be given on 5/30 @ 23:00. Vancomycin 1250 mg IV Q24H ordered to start on  5/31 @ 19:00, ~ 20 hrs after 1st dose (stacked dosing). This pt will reach Css by 6/2 @ 23:00. Will draw 1st trough on 6/2 @ 18:30, which will be at Css.   Endya Austin D 05/19/2017,10:10 PM

## 2017-05-19 NOTE — ED Notes (Signed)
Pt placed on bedpan at this time.

## 2017-05-19 NOTE — ED Triage Notes (Addendum)
Pt to ED via EMS from home, per EMS pt residence has been called out muliple times over the past couple wks for falls but pt refused transport. Per EMS social work visited pt today and called EMS for increased weakness and falls over the past few wks. Pt A&OX4 at this time, denies any pain. Pt unaware if her falls have been syncopal. EMS states pt has not been compliant with insulin , cbg was 291 en route.

## 2017-05-19 NOTE — ED Notes (Signed)
Pt 88% on RA, pt placed on 2L [place]

## 2017-05-19 NOTE — ED Notes (Signed)
Patient transported to CT 

## 2017-05-19 NOTE — Patient Outreach (Signed)
Attempt made to contact pt, followed for transition of care (discharge from [facility] 3/23, admitted from hospital 3/6-3/12 for hypercarbia, renal insufficiency, non traumatic rhabdomyolysis, opioid overdose- accidental or unintentional, hypotension)- program closed 4/30, today's call - check on pt's status.   HIPAA compliant voice message left with contact name and number.   Plan:  If no response, plan to follow up again within next 2 days.   [name] M.   Pierzchala RN CCM [facility]  [phone]

## 2017-05-19 NOTE — ED Provider Notes (Signed)
[facility] Emergency Department Provider Note    First MD Initiated Contact with Patient 05/19/17 1750     (approximate)  I have reviewed the triage vital signs and the nursing notes.   HISTORY  Chief Complaint Weakness    HPI Jessica Donovan is a 71 y.o. female presents from home via EMS with complaint of multiple falls over the past several days. Reportedly EMS is been going out her house daily due to recurrent falls the patient has been refusing transport. Social work involved and was able to encourage patient to be evaluated today. When EMS was evaluated patient she became hypoxic down to 80% percent on room air. Reportedly did take Lyrica as well as her long-acting narcotic medication and Klonopin prior to transfer. Patient was at home alone. States that she was recently started on antibiotics for urinary tract infection. Denies any dysuria at this time. Denies any fevers. No chest pain.   Past Medical History:  Diagnosis Date  . Chronic back pain   . Collagen vascular disease (HCC)   . Coronary artery disease   . Diabetes mellitus without complication (HCC)   . Hypertension   . Low back pain   . Myocardial infarction (HCC)   . Osteomyelitis of toe (HCC) 01/23/2016  . Stroke [facility])    Family History  Problem Relation Age of Onset  . CAD Mother   . CAD Father    Past Surgical History:  Procedure Laterality Date  . AMPUTATION TOE Left 11/10/2015   Procedure: AMPUTATION TOE;  Surgeon: Linus Galas, MD;  Location: [facility] ORS;  Service: Podiatry;  Laterality: Left;  . AMPUTATION TOE Left 01/24/2016   Procedure: AMPUTATION TOE (2nd mpj);  Surgeon: Linus Galas, DPM;  Location: [facility] ORS;  Service: Podiatry;  Laterality: Left;  . CARDIAC CATHETERIZATION N/A 11/12/2015   Procedure: Left Heart Cath and Coronary Angiography;  Surgeon: Marcina Millard, MD;  Location: [facility] INVASIVE CV LAB;  Service: Cardiovascular;  Laterality: N/A;  . JOINT REPLACEMENT    .  TOE AMPUTATION     Patient Active Problem List   Diagnosis Date Noted  . Opioid overdose 02/23/2017  . Altered mental status 02/23/2017  . Acute colitis 01/19/2017  . Sepsis (HCC) 01/17/2017  . Cellulitis of fourth toe of right foot 04/10/2016  . Somnolence 04/09/2016  . Dementia 04/09/2016  . Cellulitis 04/03/2016  . Hematoma of right parietal scalp 02/23/2016  . Multiple falls 02/23/2016  . Physical debility 02/23/2016  . DM2 (diabetes mellitus, type 2) (HCC) 02/23/2016  . Syncope and collapse 02/23/2016  . Polypharmacy 02/23/2016  . Osteomyelitis of toe (HCC) 01/23/2016  . Rhabdomyolysis 12/07/2015  . ARF (acute renal failure) (HCC) 11/29/2015  . Hypotension 11/29/2015  . 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 Angina effort (HCC) 11/07/2015  . Osteomyelitis (HCC) 11/07/2015      Prior to Admission medications   Medication Sig Start Date End Date Taking? Authorizing Provider  acetaminophen (TYLENOL) 500 MG tablet Take 1,000 mg by mouth every 4 (four) hours as needed for mild pain or headache.    Yes [provider]  ALPRAZolam Prudy Feeler) 1 MG tablet Take 1 mg by mouth 3 (three) times daily.  02/08/17  Yes [provider]  aspirin EC 81 MG tablet Take 81 mg by mouth daily.   Yes [provider]  atorvastatin (LIPITOR) 20 MG tablet Take 20 mg by mouth at bedtime.    Yes [provider]  Biotin 5 MG CAPS Take 5  mg by mouth daily.    Yes [provider]  celecoxib (CELEBREX) 200 MG capsule Take 200 mg by mouth daily.    Yes [provider]  cyclobenzaprine (FLEXERIL) 10 MG tablet Take 1 tablet (10 mg total) by mouth 3 (three) times daily as needed for muscle spasms. 01/27/16  Yes Enid Baas, MD  diphenoxylate-atropine (LOMOTIL) 2.5-0.025 MG tablet Take 1 tablet by mouth every 4 (four) hours as needed for diarrhea or loose stools.    Yes [provider]  ferrous sulfate 325 (65 FE) MG tablet Take 325 mg by mouth  daily with breakfast.   Yes [provider]  fesoterodine (TOVIAZ) 8 MG TB24 tablet Take 8 mg by mouth daily. At bedtime   Yes [provider]  furosemide (LASIX) 20 MG tablet Take 1 tablet (20 mg total) by mouth daily as needed. 03/01/17  Yes [name], MD  insulin aspart (NOVOLOG) 100 UNIT/ML injection Inject 3-15 Units into the skin 3 (three) times daily with meals as needed for high blood sugar. Pt uses as needed per sliding scale:    Less than 140:  0 units  140-180:  3 units 181-220:  4 units 221- 260:  6 units 261- 320:  8 units 321-360:  10 units 361-400:  12 units Greater than 400:  15 units   Yes [provider]  Insulin Glargine (TOUJEO SOLOSTAR) 300 UNIT/ML SOPN Inject 30 Units into the skin daily. 03/01/17  Yes [name], MD  isosorbide mononitrate (IMDUR) 30 MG 24 hr tablet Take 30 mg by mouth daily.    Yes [provider]  liothyronine (CYTOMEL) 25 MCG tablet Take 25 mcg by mouth daily.   Yes [provider]  lisinopril (PRINIVIL,ZESTRIL) 20 MG tablet Take 20 mg by mouth daily.   Yes [provider]  metoprolol tartrate (LOPRESSOR) 25 MG tablet Take 25 mg by mouth 2 (two) times daily. 01/28/17  Yes [provider]  mirtazapine (REMERON) 15 MG tablet Take 15 mg by mouth at bedtime.   Yes [provider]  morphine (MS CONTIN) 15 MG 12 hr tablet Take 1 tablet (15 mg total) by mouth every 12 (twelve) hours. Patient taking differently: Take 15 mg by mouth 3 (three) times daily.  03/01/17  Yes [name], MD  morphine (MSIR) 15 MG tablet Take 15 mg by mouth 3 (three) times daily as needed for severe pain.    Yes [provider]  Multiple Vitamin (MULTIVITAMIN WITH MINERALS) TABS tablet Take 1 tablet by mouth daily.   Yes [provider]  nitrofurantoin, macrocrystal-monohydrate, (MACROBID) 100 MG capsule Take 1 capsule (100 mg total) by mouth daily. 03/01/17  Yes [name], MD  nitroGLYCERIN  (NITROSTAT) 0.4 MG SL tablet Place 0.4 mg under the tongue every 5 (five) minutes as needed for chest pain. Reported on 04/16/2016   Yes [provider]  omeprazole (PRILOSEC) 20 MG capsule Take 20 mg by mouth daily.   Yes [provider]  pregabalin (LYRICA) 150 MG capsule Take 1 capsule (150 mg total) by mouth 2 (two) times daily. Patient taking differently: Take 150 mg by mouth 3 (three) times daily.  01/19/17  Yes Milagros Loll, MD  promethazine (PHENERGAN) 25 MG tablet Take 25 mg by mouth every 6 (six) hours as needed for nausea.    Yes [provider]  traZODone (DESYREL) 100 MG tablet Take 100 mg by mouth at bedtime. 01/28/17  Yes [provider]  venlafaxine (EFFEXOR) 75  MG tablet Take 225 mg by mouth daily.    Yes [provider]  vitamin B-12 (CYANOCOBALAMIN) 1000 MCG tablet Take 1,000 mcg by mouth daily.   Yes [provider]  Vitamin D, Ergocalciferol, (DRISDOL) 50000 units CAPS capsule Take 50,000 Units by mouth every Sunday. Pt takes on Sunday.    Yes [provider]    Allergies Codeine; Influenza vaccines; Methadone; Percocet [oxycodone-acetaminophen]; Tetanus toxoids; and Tetracyclines & related    Social History Social History  Substance Use Topics  . Smoking status: Never Smoker  . Smokeless tobacco: Never Used  . Alcohol use No    Review of Systems Patient denies headaches, rhinorrhea, blurry vision, numbness, shortness of breath, chest pain, edema, cough, abdominal pain, nausea, vomiting, diarrhea, dysuria, fevers, rashes or hallucinations unless otherwise stated above in HPI. ____________________________________________   PHYSICAL EXAM:  VITAL SIGNS: Vitals:   05/19/17 1900 05/19/17 1918  BP: 122/87   Pulse: 95 94  Resp: (!) 21 (!) 21  Temp:      Constitutional: Alert chronically ill appearing female in no acute distress on 2L [place] Eyes: Conjunctivae are normal.  Head: Atraumatic. Nose: No  congestion/rhinnorhea. Mouth/Throat: Mucous membranes are moist.   Neck: No stridor. Painless ROM.  Cardiovascular: Normal rate, regular rhythm. Grossly normal heart sounds.  Good peripheral circulation. Respiratory: diminished breathsounds throughout, no rhonchi or wheeze Gastrointestinal: Soft and nontender. No distention. No abdominal bruits. No CVA tenderness. Genitourinary:  Musculoskeletal: No lower extremity tenderness nor edema.  No joint effusions.  Chronic ulcerations to bilateral ankles over the lateral mal Neurologic:  Normal speech and language. No gross focal neurologic deficits are appreciated. No facial droop Skin:  Skin is warm, dry and intact. No rash noted. Psychiatric: Mood and affect are normal. Speech and behavior are normal.  ____________________________________________   LABS (all labs ordered are listed, but only abnormal results are displayed)  Results for orders placed or performed during the hospital encounter of 05/19/17 (from the past 24 hour(s))  Blood gas, venous     Status: None   Collection Time: 05/19/17  6:01 PM  Result Value Ref Range   pH, Ven 7.34 7.250 - 7.430   pCO2, Ven 47 44.0 - 60.0 mmHg   pO2, Ven 38.0 32.0 - 45.0 mmHg   Bicarbonate 25.4 20.0 - 28.0 mmol/L   Acid-base deficit 0.8 0.0 - 2.0 mmol/L   O2 Saturation 68.1 %   Patient temperature 37.0    Collection site VEIN    Sample type VEIN   CBC with Differential/Platelet     Status: Abnormal   Collection Time: 05/19/17  6:03 PM  Result Value Ref Range   WBC 9.2 3.6 - 11.0 K/uL   RBC 3.94 3.80 - 5.20 MIL/uL   Hemoglobin 12.7 12.0 - 16.0 g/dL   HCT 16.138.0 09.635.0 - 04.547.0 %   MCV 96.3 80.0 - 100.0 fL   MCH 32.3 26.0 - 34.0 pg   MCHC 33.5 32.0 - 36.0 g/dL   RDW 40.914.1 81.111.5 - 91.414.5 %   Platelets 224 150 - 440 K/uL   Neutrophils Relative % 82 %   Neutro Abs 7.5 (H) 1.4 - 6.5 K/uL   Lymphocytes Relative 10 %   Lymphs Abs 0.9 (L) 1.0 - 3.6 K/uL   Monocytes Relative 8 %   Monocytes Absolute  0.7 0.2 - 0.9 K/uL   Eosinophils Relative 0 %   Eosinophils Absolute 0.0 0 - 0.7 K/uL   Basophils Relative 0 %   Basophils  Absolute 0.0 0 - 0.1 K/uL  Comprehensive metabolic panel     Status: Abnormal   Collection Time: 05/19/17  6:03 PM  Result Value Ref Range   Sodium 138 135 - 145 mmol/L   Potassium 4.4 3.5 - 5.1 mmol/L   Chloride 104 101 - 111 mmol/L   CO2 24 22 - 32 mmol/L   Glucose, Bld 275 (H) 65 - 99 mg/dL   BUN 44 (H) 6 - 20 mg/dL   Creatinine, Ser 1.61 (H) 0.44 - 1.00 mg/dL   Calcium 9.2 8.9 - 09.6 mg/dL   Total Protein 6.6 6.5 - 8.1 g/dL   Albumin 3.9 3.5 - 5.0 g/dL   AST 045 (H) 15 - 41 U/L   ALT 45 14 - 54 U/L   Alkaline Phosphatase 87 38 - 126 U/L   Total Bilirubin 0.5 0.3 - 1.2 mg/dL   GFR calc non Af Amer 53 (L) >60 mL/min   GFR calc Af Amer >60 >60 mL/min   Anion gap 10 5 - 15  Lactic acid, plasma     Status: Abnormal   Collection Time: 05/19/17  6:03 PM  Result Value Ref Range   Lactic Acid, Venous 3.4 (HH) 0.5 - 1.9 mmol/L  Troponin I     Status: None   Collection Time: 05/19/17  6:03 PM  Result Value Ref Range   Troponin I <0.03 <0.03 ng/mL  Urinalysis, Complete w Microscopic     Status: Abnormal   Collection Time: 05/19/17  6:09 PM  Result Value Ref Range   Color, Urine YELLOW (A) YELLOW   APPearance CLEAR (A) CLEAR   Specific Gravity, Urine 1.014 1.005 - 1.030   pH 5.0 5.0 - 8.0   Glucose, UA 50 (A) NEGATIVE mg/dL   Hgb urine dipstick SMALL (A) NEGATIVE   Bilirubin Urine NEGATIVE NEGATIVE   Ketones, ur 5 (A) NEGATIVE mg/dL   Protein, ur NEGATIVE NEGATIVE mg/dL   Nitrite NEGATIVE NEGATIVE   Leukocytes, UA TRACE (A) NEGATIVE   RBC / HPF 0-5 0 - 5 RBC/hpf   WBC, UA 6-30 0 - 5 WBC/hpf   Bacteria, UA RARE (A) NONE SEEN   Squamous Epithelial / LPF NONE SEEN NONE SEEN   ____________________________________________  EKG My review and personal interpretation at Time: 17:58   Indication: sob  Rate: 95  Rhythm: sinus Axis: normal Other: poor r wave  progression, no sTEMI ____________________________________________  RADIOLOGY  I personally reviewed all radiographic images ordered to evaluate for the above acute complaints and reviewed radiology reports and findings.  These findings were personally discussed with the patient.  Please see medical record for radiology report.  ____________________________________________   PROCEDURES  Procedure(s) performed:  Procedures    Critical Care performed: yes CRITICAL CARE Performed by: Willy Eddy   Total critical care time: 40 minutes  Critical care time was exclusive of separately billable procedures and treating other patients.  Critical care was necessary to treat or prevent imminent or life-threatening deterioration.  Critical care was time spent personally by me on the following activities: development of treatment plan with patient and/or surrogate as well as nursing, discussions with consultants, evaluation of patient's response to treatment, examination of patient, obtaining history from patient or surrogate, ordering and performing treatments and interventions, ordering and review of laboratory studies, ordering and review of radiographic studies, pulse oximetry and re-evaluation of patient's condition.  ____________________________________________   INITIAL IMPRESSION / ASSESSMENT AND PLAN / ED COURSE  Pertinent labs & imaging results that  were available during my care of the patient were reviewed by me and considered in my medical decision making (see chart for details).  DDX: Dehydration, sepsis, pna, uti, hypoglycemia, cva, drug effect, withdrawal   Jessica Donovan is a 71 y.o. who presents to the ED with multiple falls and above complaints. CT imaging will be ordered to evaluate for trauma as she is on Dilantin. Patient alert and responding to questions area of mild tachypnea. Is on multiple sedating medications that does not have pinpoint pupils. We'll continue  monitoring give dose of albuterol as well as evaluate for any evidence of heart failure.  The patient will be placed on continuous pulse oximetry and telemetry for monitoring.  Laboratory evaluation will be sent to evaluate for the above complaints.        ----------------------------------------- 8:32 PM on 05/19/2017 -----------------------------------------  Patient with mild improvement after Narcan. Reviewed CT imaging.  Patient will be started on antibiotics for a symptomatic UTI which would explain her confusion and frequent falls. Cultures obtained. We'll continue IV fluids. This is for her lactic acidosis. Patient is not overtly septic. However she will require admission for further evaluation given her acute respiratory failure w/ hypoxia.  I spoke with Dr. Anne Hahn who kindly agrees to admit patient for further evaluation and management.   ____________________________________________   FINAL CLINICAL IMPRESSION(S) / ED DIAGNOSES  Final diagnoses:  Acute respiratory failure with hypoxia (HCC)  Acute cystitis without hematuria  Frequent falls  Lactic acidosis      NEW MEDICATIONS STARTED DURING THIS VISIT:  New Prescriptions   No medications on file     Note:  This document was prepared using Dragon voice recognition software and may include unintentional dictation errors.    Willy Eddy, MD 05/19/17 2034

## 2017-05-19 NOTE — ED Notes (Addendum)
Pt will arouse but will fall asleep in mid conversation. Pt denies taking any of her morphine today. MD aware, See orders

## 2017-05-19 NOTE — ED Notes (Signed)
Date and time results received: 05/19/17 1900 (use smartphrase ".now" to insert current time)  Test: lacic Critical Value: 3.4  Name of Provider Notified: robinson  Orders Received? Or Actions Taken?: Orders Received - See Orders for details

## 2017-05-19 NOTE — H&P (Signed)
[facility] - [place] at [facility]   PATIENT NAME: Jessica Donovan    MR#:  [id number]  DATE OF BIRTH:  1946/07/27  DATE OF ADMISSION:  05/19/2017  PRIMARY CARE PHYSICIAN: Garlon Hatchet, MD   REQUESTING/REFERRING PHYSICIAN: Roxan Hockey, MD  CHIEF COMPLAINT:   Chief Complaint  Patient presents with  . Weakness    HISTORY OF PRESENT ILLNESS:  Jessica Donovan  is a 71 y.o. female who presents with Multiple recent falls. She states that she's been having progressive weakness. She also displays some confusion. She states she's been having significant urinary frequency, as well as dysuria. Here in the ED her UA does show some white cells and some leuk esterase. She does not meet sepsis criteria, but she is mildly hypotensive and does have a lactate of 3.4. She is also on significant dose of narcotics at home for chronic pain, as well as benzodiazepine. In the past she has been admitted with suspicion of opiate overdose, unclear whether intentional or not, though on chart review it seems that in the past she adamantly denied any intention of ever harming herself. Hospitals were called for admission and further evaluation and treatment  PAST MEDICAL HISTORY:   Past Medical History:  Diagnosis Date  . Chronic back pain   . Collagen vascular disease (HCC)   . Coronary artery disease   . Diabetes mellitus without complication (HCC)   . Hypertension   . Low back pain   . Myocardial infarction (HCC)   . Osteomyelitis of toe (HCC) 01/23/2016  . Stroke [facility])     PAST SURGICAL HISTORY:   Past Surgical History:  Procedure Laterality Date  . AMPUTATION TOE Left 11/10/2015   Procedure: AMPUTATION TOE;  Surgeon: Linus Galas, MD;  Location: [facility] ORS;  Service: Podiatry;  Laterality: Left;  . AMPUTATION TOE Left 01/24/2016   Procedure: AMPUTATION TOE (2nd mpj);  Surgeon: Linus Galas, DPM;  Location: [facility] ORS;  Service: Podiatry;  Laterality: Left;  . CARDIAC CATHETERIZATION  N/A 11/12/2015   Procedure: Left Heart Cath and Coronary Angiography;  Surgeon: Marcina Millard, MD;  Location: [facility] INVASIVE CV LAB;  Service: Cardiovascular;  Laterality: N/A;  . JOINT REPLACEMENT    . TOE AMPUTATION      SOCIAL HISTORY:   Social History  Substance Use Topics  . Smoking status: Never Smoker  . Smokeless tobacco: Never Used  . Alcohol use No    FAMILY HISTORY:   Family History  Problem Relation Age of Onset  . CAD Mother   . CAD Father     DRUG ALLERGIES:   Allergies  Allergen Reactions  . Codeine Nausea And Vomiting  . Influenza Vaccines Other (See Comments)    Reaction:  Caused pt to pass out   . Methadone Hives and Itching  . Percocet [Oxycodone-Acetaminophen] Nausea And Vomiting  . Tetanus Toxoids Swelling and Other (See Comments)    Reaction:  Swelling at injection site  . Tetracyclines & Related Rash    MEDICATIONS AT HOME:   Prior to Admission medications   Medication Sig Start Date End Date Taking? Authorizing Provider  acetaminophen (TYLENOL) 500 MG tablet Take 1,000 mg by mouth every 4 (four) hours as needed for mild pain or headache.    Yes [provider]  ALPRAZolam Prudy Feeler) 1 MG tablet Take 1 mg by mouth 3 (three) times daily.  02/08/17  Yes [provider]  aspirin EC 81 MG tablet Take 81 mg by mouth daily.   Yes  [provider]  atorvastatin (LIPITOR) 20 MG tablet Take 20 mg by mouth at bedtime.    Yes [provider]  Biotin 5 MG CAPS Take 5 mg by mouth daily.    Yes [provider]  celecoxib (CELEBREX) 200 MG capsule Take 200 mg by mouth daily.    Yes [provider]  cyclobenzaprine (FLEXERIL) 10 MG tablet Take 1 tablet (10 mg total) by mouth 3 (three) times daily as needed for muscle spasms. 01/27/16  Yes Enid Baas, MD  diphenoxylate-atropine (LOMOTIL) 2.5-0.025 MG tablet Take 1 tablet by mouth every 4 (four) hours as needed for diarrhea or loose stools.    Yes  [provider]  ferrous sulfate 325 (65 FE) MG tablet Take 325 mg by mouth daily with breakfast.   Yes [provider]  fesoterodine (TOVIAZ) 8 MG TB24 tablet Take 8 mg by mouth daily. At bedtime   Yes [provider]  furosemide (LASIX) 20 MG tablet Take 1 tablet (20 mg total) by mouth daily as needed. 03/01/17  Yes [name], MD  insulin aspart (NOVOLOG) 100 UNIT/ML injection Inject 3-15 Units into the skin 3 (three) times daily with meals as needed for high blood sugar. Pt uses as needed per sliding scale:    Less than 140:  0 units  140-180:  3 units 181-220:  4 units 221- 260:  6 units 261- 320:  8 units 321-360:  10 units 361-400:  12 units Greater than 400:  15 units   Yes [provider]  Insulin Glargine (TOUJEO SOLOSTAR) 300 UNIT/ML SOPN Inject 30 Units into the skin daily. 03/01/17  Yes [name], MD  isosorbide mononitrate (IMDUR) 30 MG 24 hr tablet Take 30 mg by mouth daily.    Yes [provider]  liothyronine (CYTOMEL) 25 MCG tablet Take 25 mcg by mouth daily.   Yes [provider]  lisinopril (PRINIVIL,ZESTRIL) 20 MG tablet Take 20 mg by mouth daily.   Yes [provider]  metoprolol tartrate (LOPRESSOR) 25 MG tablet Take 25 mg by mouth 2 (two) times daily. 01/28/17  Yes [provider]  mirtazapine (REMERON) 15 MG tablet Take 15 mg by mouth at bedtime.   Yes [provider]  morphine (MS CONTIN) 15 MG 12 hr tablet Take 1 tablet (15 mg total) by mouth every 12 (twelve) hours. Patient taking differently: Take 15 mg by mouth 3 (three) times daily.  03/01/17  Yes [name], MD  morphine (MSIR) 15 MG tablet Take 15 mg by mouth 3 (three) times daily as needed for severe pain.    Yes [provider]  Multiple Vitamin (MULTIVITAMIN WITH MINERALS) TABS tablet Take 1 tablet by mouth daily.   Yes [provider]  nitrofurantoin, macrocrystal-monohydrate, (MACROBID) 100 MG capsule Take 1  capsule (100 mg total) by mouth daily. 03/01/17  Yes [name], MD  nitroGLYCERIN (NITROSTAT) 0.4 MG SL tablet Place 0.4 mg under the tongue every 5 (five) minutes as needed for chest pain. Reported on 04/16/2016   Yes [provider]  omeprazole (PRILOSEC) 20 MG capsule Take 20 mg by mouth daily.   Yes [provider]  pregabalin (LYRICA) 150 MG capsule Take 1 capsule (150 mg total) by mouth 2 (two) times daily. Patient taking differently: Take 150 mg by mouth 3 (three) times daily.  01/19/17  Yes Milagros Loll, MD  promethazine (PHENERGAN) 25 MG tablet Take 25 mg by mouth every 6 (six) hours as needed for nausea.  Yes [provider]  traZODone (DESYREL) 100 MG tablet Take 100 mg by mouth at bedtime. 01/28/17  Yes [provider]  venlafaxine (EFFEXOR) 75 MG tablet Take 225 mg by mouth daily.    Yes [provider]  vitamin B-12 (CYANOCOBALAMIN) 1000 MCG tablet Take 1,000 mcg by mouth daily.   Yes [provider]  Vitamin D, Ergocalciferol, (DRISDOL) 50000 units CAPS capsule Take 50,000 Units by mouth every Sunday. Pt takes on Sunday.    Yes [provider]    REVIEW OF SYSTEMS:  Review of Systems  Genitourinary: Positive for dysuria and frequency.  Musculoskeletal: Positive for falls.    Due to the patient's altered mental status review of systems is not completely reliable, but she does admit to above symptoms VITAL SIGNS:   Vitals:   05/19/17 1900 05/19/17 1918 05/19/17 2030 05/19/17 2037  BP: 122/87  (!) 86/62 (!) 92/50  Pulse: 95 94 94 95  Resp: (!) 21 (!) 21 (!) 22 (!) 24  Temp:      TempSrc:      SpO2: 94% (!) 87% 98% 97%   Wt Readings from Last 3 Encounters:  03/26/17 79.4 kg (175 lb)  02/23/17 85.7 kg (188 lb 14.4 oz)  02/11/17 77.6 kg (171 lb)    PHYSICAL EXAMINATION:  Physical Exam  Vitals reviewed. Constitutional: She appears well-developed and well-nourished. No distress.  HENT:  Head: Normocephalic  and atraumatic.  Mouth/Throat: Oropharynx is clear and moist.  Eyes: Conjunctivae and EOM are normal. Pupils are equal, round, and reactive to light. No scleral icterus.  Neck: Normal range of motion. Neck supple. No JVD present. No thyromegaly present.  Cardiovascular: Normal rate, regular rhythm and intact distal pulses.  Exam reveals no gallop and no friction rub.   No murmur heard. Respiratory: Effort normal and breath sounds normal. No respiratory distress. She has no wheezes. She has no rales.  GI: Soft. Bowel sounds are normal. She exhibits no distension. There is tenderness (Low abdomen, mild).  Musculoskeletal: Normal range of motion. She exhibits no edema.  No arthritis, no gout  Lymphadenopathy:    She has no cervical adenopathy.  Neurological: No cranial nerve deficit.  Arousable, can converse some, but she is not oriented, and often responds and significantly confused way, very somnolent  Skin: Skin is warm and dry. No rash noted. There is erythema (Bilateral ankles, with ulceration).  Psychiatric:  Unable to assess due to patient condition    LABORATORY PANEL:   CBC  Recent Labs Lab 05/19/17 1803  WBC 9.2  HGB 12.7  HCT 38.0  PLT 224   ------------------------------------------------------------------------------------------------------------------  Chemistries   Recent Labs Lab 05/19/17 1803  NA 138  K 4.4  CL 104  CO2 24  GLUCOSE 275*  BUN 44*  CREATININE 1.05*  CALCIUM 9.2  AST 163*  ALT 45  ALKPHOS 87  BILITOT 0.5   ------------------------------------------------------------------------------------------------------------------  Cardiac Enzymes  Recent Labs Lab 05/19/17 1803  TROPONINI <0.03   ------------------------------------------------------------------------------------------------------------------  RADIOLOGY:  Dg Chest 2 View  Result Date: 05/19/2017 CLINICAL DATA:  Acute hypoxia.  Concern for pneumonia. EXAM: CHEST  2 VIEW  COMPARISON:  02/23/2017 FINDINGS: Again noted are low lung volumes. There is no focal airspace disease or pulmonary edema. Heart and mediastinum are within normal limits and stable. Negative for pneumothorax. No large pleural effusions. Multilevel degenerative changes in the thoracic spine. IMPRESSION: Low lung volumes without acute findings. Electronically Signed   By: Meriel Pica.D.  On: 05/19/2017 18:58   Ct Head Wo Contrast  Result Date: 05/19/2017 CLINICAL DATA:  71 year old female with frequent falls. Evaluate for intracranial hemorrhage. EXAM: CT HEAD WITHOUT CONTRAST TECHNIQUE: Contiguous axial images were obtained from the base of the skull through the vertex without intravenous contrast. COMPARISON:  Head CT dated 02/23/2017 FINDINGS: Evaluation of this exam is limited due to motion artifact. Brain: The ventricles and sulci appropriate size for patient's age. Mild periventricular and deep white matter chronic microvascular ischemic changes noted. There is no acute intracranial hemorrhage. No mass effect or midline shift noted. No extra-axial fluid collection. Vascular: No hyperdense vessel or unexpected calcification. Skull: Normal. Negative for fracture or focal lesion. Sinuses/Orbits: No acute finding. Other: None IMPRESSION: 1. No acute intracranial hemorrhage. 2. Mild chronic microvascular ischemic changes. Electronically Signed   By: Elgie CollardArash  Radparvar M.D.   On: 05/19/2017 20:24   Ct Angio Chest Pe W Or Wo Contrast  Result Date: 05/19/2017 CLINICAL DATA:  Acute respiratory failure with hypoxia EXAM: CT ANGIOGRAPHY CHEST WITH CONTRAST TECHNIQUE: Multidetector CT imaging of the chest was performed using the standard protocol during bolus administration of intravenous contrast. Multiplanar CT image reconstructions and MIPs were obtained to evaluate the vascular anatomy. CONTRAST:  75 cc of Isovue 370 COMPARISON:  None FINDINGS: Cardiovascular: Satisfactory opacification of the pulmonary arteries  to the segmental level. No evidence of pulmonary embolism. Normal heart size. No pericardial effusion. Aortic atherosclerosis noted. Calcification in the LAD coronary artery noted. Mediastinum/Nodes: The trachea appears patent and is midline. Normal appearance of the esophagus. No enlarged mediastinal or hilar lymph nodes. No supraclavicular or axillary adenopathy. Lungs/Pleura: Scar versus subsegmental atelectasis noted within the lingula and right middle lobe. Upper Abdomen: No acute abnormality. Musculoskeletal: There is degenerative disc disease noted throughout the thoracic spine. No aggressive lytic for sclerotic bone lesions identified. Review of the MIP images confirms the above findings. IMPRESSION: 1. No acute pulmonary embolus. 2. Aortic Atherosclerosis (ICD10-I70.0). LAD coronary artery calcification noted. 3. Right middle lobe and lingular subsegmental atelectasis. Electronically Signed   By: Signa Kellaylor  Stroud M.D.   On: 05/19/2017 20:28    EKG:   Orders placed or performed during the hospital encounter of 05/19/17  . EKG 12-Lead  . EKG 12-Lead  . ED EKG  . ED EKG    IMPRESSION AND PLAN:  Principal Problem:   UTI (urinary tract infection) - we will treatment with IV antibiotics, patient does not meet sepsis criteria, urine culture sent. Active Problems:   Altered mental status - likely due to her infection, or narcotic with benzo use, or both. getting a urine drug screen, holding narcotics for now, reduce Xanax dose to 0.5 mg twice a day   Cellulitis in diabetic foot (HCC) - bilateral ankle ulcerations with some surrounding cellulitis. We will get x-rays of both ankles to screen for any osteomyelitis   Hypotension - likely due to above conditions, IV fluids, check lactic acid until within normal limits, hold antihypertensives   DM2 (diabetes mellitus, type 2) (HCC) - sliding scale insulin with corresponding glucose checks   CAD (coronary artery disease) - continue home meds   Chronic  narcotic use - holding narcotics for now until mental status improves  All the records are reviewed and case discussed with ED provider. Management plans discussed with the patient and/or family.  DVT PROPHYLAXIS: SubQ lovenox  GI PROPHYLAXIS: PPI home dose  ADMISSION STATUS: Inpatient  CODE STATUS: DNR Code Status History    Date Active Date Inactive  Code Status Order ID Comments User Context   02/23/2017  4:40 PM 03/01/2017  7:02 PM DNR [id number]  Altamese Dilling, MD Inpatient   01/17/2017  4:07 AM 01/19/2017  8:10 PM DNR [id number]  Arnaldo Natal, MD Inpatient   04/09/2016  3:11 AM 04/10/2016  8:44 PM DNR [id number]  Arnaldo Natal, MD Inpatient   04/09/2016  2:05 AM 04/09/2016  3:11 AM Full Code [id number]  Oralia Manis, MD Inpatient   04/03/2016  6:53 PM 04/04/2016  6:17 PM Full Code [id number]  Houston Siren, MD Inpatient   02/23/2016 10:52 PM 02/25/2016  4:37 PM DNR [id number]  Robley Fries, MD Inpatient   01/24/2016  3:54 PM 01/27/2016  7:06 PM DNR [id number]  Milagros Loll, MD Inpatient   01/23/2016  4:07 PM 01/23/2016  4:22 PM Full Code [id number]  Shaune Pollack, MD Inpatient   12/07/2015 10:23 PM 12/09/2015  6:58 PM Full Code [id number]  Auburn Bilberry, MD Inpatient   11/29/2015  5:41 PM 12/01/2015  6:33 PM DNR [id number]  Shaune Pollack, MD Inpatient   11/10/2015  9:54 AM 11/12/2015  9:39 PM DNR [id number]  Linus Galas, MD Inpatient   11/07/2015  5:32 PM 11/10/2015  9:54 AM DNR [id number]  Altamese Dilling, MD Inpatient   11/07/2015  3:49 PM 11/07/2015  5:32 PM Full Code [id number]  Altamese Dilling, MD Inpatient    Questions for Most Recent Historical Code Status (Order [id number])    Question Answer Comment   In the event of cardiac or respiratory ARREST Do not call a "code blue"    In the event of cardiac or respiratory ARREST Do not perform Intubation, CPR, defibrillation or ACLS    In the event of cardiac or respiratory ARREST Use medication by any route, position, wound  care, and other measures to relive pain and suffering. May use oxygen, suction and manual treatment of airway obstruction as needed for comfort.         Advance Directive Documentation     Most Recent Value  Type of Advance Directive  Out of facility DNR (pink MOST or yellow form)  Pre-existing out of facility DNR order (yellow form or pink MOST form)  Yellow form placed in chart (order not valid for inpatient use)  "MOST" Form in Place?  -      TOTAL TIME TAKING CARE OF THIS PATIENT: 45 minutes.   Duane Earnshaw FIELDING 05/19/2017, 8:48 PM  [facility]  Office  [phone]  CC: Primary care physician; Garlon Hatchet, MD  Note:  This document was prepared using Dragon voice recognition software and may include unintentional dictation errors.

## 2017-05-19 NOTE — ED Notes (Signed)
MD Willis at bedside. 

## 2017-05-19 NOTE — Progress Notes (Signed)
Spoke with Dr. Anne HahnWillis lactic acid now 2.7 Md acknowledged no new orders at this time.

## 2017-05-19 NOTE — ED Notes (Signed)
After 0.2 narcan pt still appears to be sleepy, pt alert to stimuli but falling asleep frequently, MD awre

## 2017-05-19 NOTE — ED Notes (Signed)
Lab call for add on of uds

## 2017-05-19 NOTE — ED Notes (Signed)
Pt 87% on 2L [place], RN increased pt to 4L [place] , MD aware

## 2017-05-19 NOTE — ED Notes (Signed)
Pt slurred speech at this time, MD aware and at bedside

## 2017-05-20 LAB — GLUCOSE, CAPILLARY
GLUCOSE-CAPILLARY: 130 mg/dL — AB (ref 65–99)
GLUCOSE-CAPILLARY: 190 mg/dL — AB (ref 65–99)
Glucose-Capillary: 110 mg/dL — ABNORMAL HIGH (ref 65–99)
Glucose-Capillary: 256 mg/dL — ABNORMAL HIGH (ref 65–99)

## 2017-05-20 LAB — BASIC METABOLIC PANEL
ANION GAP: 6 (ref 5–15)
BUN: 30 mg/dL — ABNORMAL HIGH (ref 6–20)
CALCIUM: 8.8 mg/dL — AB (ref 8.9–10.3)
CO2: 28 mmol/L (ref 22–32)
Chloride: 107 mmol/L (ref 101–111)
Creatinine, Ser: 0.8 mg/dL (ref 0.44–1.00)
GLUCOSE: 174 mg/dL — AB (ref 65–99)
POTASSIUM: 3.9 mmol/L (ref 3.5–5.1)
SODIUM: 141 mmol/L (ref 135–145)

## 2017-05-20 LAB — SEDIMENTATION RATE: Sed Rate: 27 mm/hr (ref 0–30)

## 2017-05-20 LAB — CBC
HCT: 35.7 % (ref 35.0–47.0)
HEMOGLOBIN: 12.1 g/dL (ref 12.0–16.0)
MCH: 32.7 pg (ref 26.0–34.0)
MCHC: 33.8 g/dL (ref 32.0–36.0)
MCV: 96.7 fL (ref 80.0–100.0)
Platelets: 186 10*3/uL (ref 150–440)
RBC: 3.7 MIL/uL — ABNORMAL LOW (ref 3.80–5.20)
RDW: 14.1 % (ref 11.5–14.5)
WBC: 7.3 10*3/uL (ref 3.6–11.0)

## 2017-05-20 LAB — LACTIC ACID, PLASMA: LACTIC ACID, VENOUS: 1.9 mmol/L (ref 0.5–1.9)

## 2017-05-20 LAB — MRSA PCR SCREENING: MRSA BY PCR: NEGATIVE

## 2017-05-20 MED ORDER — MORPHINE SULFATE 15 MG PO TABS
15.0000 mg | ORAL_TABLET | ORAL | Status: DC | PRN
  Administered 2017-05-20 – 2017-05-23 (×8): 15 mg via ORAL
  Filled 2017-05-20 (×9): qty 1

## 2017-05-20 MED ORDER — INSULIN GLARGINE 100 UNIT/ML ~~LOC~~ SOLN
20.0000 [IU] | Freq: Every day | SUBCUTANEOUS | Status: DC
  Administered 2017-05-20 – 2017-05-21 (×2): 20 [IU] via SUBCUTANEOUS
  Filled 2017-05-20 (×4): qty 0.2

## 2017-05-20 MED ORDER — ALPRAZOLAM 1 MG PO TABS
1.0000 mg | ORAL_TABLET | Freq: Two times a day (BID) | ORAL | Status: DC
  Administered 2017-05-20 – 2017-05-23 (×6): 1 mg via ORAL
  Filled 2017-05-20 (×7): qty 1

## 2017-05-20 MED ORDER — DEXTROSE 5 % IV SOLN
1.0000 g | INTRAVENOUS | Status: DC
  Administered 2017-05-20 – 2017-05-21 (×2): 1 g via INTRAVENOUS
  Filled 2017-05-20 (×3): qty 10

## 2017-05-20 MED ORDER — DOCUSATE SODIUM 100 MG PO CAPS
100.0000 mg | ORAL_CAPSULE | Freq: Two times a day (BID) | ORAL | Status: DC
  Administered 2017-05-20 – 2017-05-21 (×2): 100 mg via ORAL
  Filled 2017-05-20 (×6): qty 1

## 2017-05-20 MED ORDER — SIMETHICONE 80 MG PO CHEW
80.0000 mg | CHEWABLE_TABLET | Freq: Four times a day (QID) | ORAL | Status: DC | PRN
  Administered 2017-05-20 – 2017-05-21 (×2): 80 mg via ORAL
  Filled 2017-05-20 (×2): qty 1

## 2017-05-20 NOTE — Progress Notes (Signed)
Patient ID: Jessica Donovan, female   DOB: [birth year], 71 y.o.   MRN: [id number]  [facility] PROGRESS NOTE  Jessica Donovan WUJ:[id number] DOB: 1946-06-20 DOA: 05/19/2017 PCP: Garlon Hatchet, MD  HPI/Subjective: Patient brought in with altered mental status. Patient feeling fine at this point and asking for her Xanax and pain medication. She stated that she called her primary care physician for urinary tract infection medications and receive the same medication that she was already on. Complains of chronic back pain. She has had bilateral ankle ulcers for the last 3 months.  Objective: Vitals:   05/19/17 2158 05/20/17 0731  BP: (!) 133/49 (!) 126/49  Pulse: 86 76  Resp: 19   Temp: 98.1 F (36.7 C) 98.1 F (36.7 C)    Filed Weights   05/19/17 2147  Weight: 86.6 kg (191 lb)    ROS: Review of Systems  Constitutional: Negative for chills and fever.  Eyes: Negative for blurred vision.  Respiratory: Negative for cough and shortness of breath.   Cardiovascular: Negative for chest pain.  Gastrointestinal: Negative for abdominal pain, constipation, diarrhea, nausea and vomiting.  Genitourinary: Negative for dysuria.  Musculoskeletal: Positive for back pain. Negative for joint pain.  Neurological: Negative for dizziness and headaches.   Exam: Physical Exam  Constitutional: She is oriented to person, place, and time.  HENT:  Nose: No mucosal edema.  Mouth/Throat: No oropharyngeal exudate.  Eyes: Conjunctivae, EOM and lids are normal. Pupils are equal, round, and reactive to light.  Neck: Carotid bruit is not present. No thyromegaly present.  Cardiovascular: Regular rhythm, S1 normal, S2 normal and normal heart sounds.   Pulses:      Dorsalis pedis pulses are 1+ on the right side, and 1+ on the left side.  Respiratory: No respiratory distress. She has no decreased breath sounds. She has no wheezes. She has no rhonchi. She has no rales.  GI: Soft. Bowel sounds are normal. There  is no tenderness.  Musculoskeletal:       Right ankle: She exhibits no swelling.       Left ankle: She exhibits no swelling.  Lymphadenopathy:    She has no cervical adenopathy.  Neurological: She is alert and oriented to person, place, and time.  Skin: Skin is warm. No rash noted.  Bilateral lateral ankle ulcerations with slight redness surrounding.  Psychiatric: She has a normal mood and affect.      Data Reviewed: Basic Metabolic Panel:  Recent Labs Lab 05/19/17 1803 05/20/17 0338  NA 138 141  K 4.4 3.9  CL 104 107  CO2 24 28  GLUCOSE 275* 174*  BUN 44* 30*  CREATININE 1.05* 0.80  CALCIUM 9.2 8.8*   Liver Function Tests:  Recent Labs Lab 05/19/17 1803  AST 163*  ALT 45  ALKPHOS 87  BILITOT 0.5  PROT 6.6  ALBUMIN 3.9   CBC:  Recent Labs Lab 05/19/17 1803 05/20/17 0338  WBC 9.2 7.3  NEUTROABS 7.5*  --   HGB 12.7 12.1  HCT 38.0 35.7  MCV 96.3 96.7  PLT 224 186   Cardiac Enzymes:  Recent Labs Lab 05/19/17 1803  TROPONINI <0.03    CBG:  Recent Labs Lab 05/19/17 2151 05/20/17 0729 05/20/17 1131  GLUCAP 212* 110* 256*    Recent Results (from the past 240 hour(s))  Culture, blood (Routine X 2) w Reflex to ID Panel     Status: None (Preliminary result)   Collection Time: 05/19/17  9:53 PM  Result Value Ref Range Status  Specimen Description BLOOD BLOOD LEFT ARM  Final   Special Requests   Final    BOTTLES DRAWN AEROBIC AND ANAEROBIC Blood Culture adequate volume   Culture NO GROWTH < 12 HOURS  Final   Report Status PENDING  Incomplete  Culture, blood (Routine X 2) w Reflex to ID Panel     Status: None (Preliminary result)   Collection Time: 05/19/17 10:30 PM  Result Value Ref Range Status   Specimen Description BLOOD RIGHT HAND  Final   Special Requests BOTTLES DRAWN AEROBIC AND ANAEROBIC BCAV  Final   Culture NO GROWTH < 12 HOURS  Final   Report Status PENDING  Incomplete  MRSA PCR Screening     Status: None   Collection Time:  05/19/17 11:00 PM  Result Value Ref Range Status   MRSA by PCR NEGATIVE NEGATIVE Final    Comment:        The GeneXpert MRSA Assay (FDA approved for NASAL specimens only), is one component of a comprehensive MRSA colonization surveillance program. It is not intended to diagnose MRSA infection nor to guide or monitor treatment for MRSA infections.      Studies: Dg Chest 2 View  Result Date: 05/19/2017 CLINICAL DATA:  Acute hypoxia.  Concern for pneumonia. EXAM: CHEST  2 VIEW COMPARISON:  02/23/2017 FINDINGS: Again noted are low lung volumes. There is no focal airspace disease or pulmonary edema. Heart and mediastinum are within normal limits and stable. Negative for pneumothorax. No large pleural effusions. Multilevel degenerative changes in the thoracic spine. IMPRESSION: Low lung volumes without acute findings. Electronically Signed   By: Richarda Overlie M.D.   On: 05/19/2017 18:58   Dg Ankle Complete Left  Result Date: 05/20/2017 CLINICAL DATA:  Ulceration at the lateral left ankle, acute onset. Initial encounter. EXAM: LEFT ANKLE COMPLETE - 3+ VIEW COMPARISON:  Left foot radiographs performed 11/07/2015 FINDINGS: There is no evidence of fracture or dislocation. The ankle mortise is intact; the interosseous space is within normal limits. No talar tilt or subluxation is seen. An os peroneum is noted. Large plantar and posterior calcaneal spurs are seen, with scattered osseous fragments overlying the plantar fascia. The joint spaces are preserved. A soft tissue ulceration is noted overlying the distal fibula. No osseous erosion is seen. IMPRESSION: 1. No evidence of fracture or dislocation. No evidence of osseous erosion. 2. Os peroneum noted. 3. Soft tissue ulceration overlying the distal fibula. Electronically Signed   By: Roanna Raider M.D.   On: 05/20/2017 05:08   Dg Ankle Complete Right  Result Date: 05/20/2017 CLINICAL DATA:  Right ankle ulceration, acute onset. Initial encounter. EXAM:  RIGHT ANKLE - COMPLETE 3+ VIEW COMPARISON:  Right foot radiographs performed 04/03/2016 FINDINGS: There is no evidence of fracture or dislocation. No osseous erosions are seen. The ankle mortise is intact; the interosseous space is within normal limits. No talar tilt or subluxation is seen. A small osseous fragment is noted adjacent to the medial malleolus. Plantar and posterior calcaneal spurs are seen. The joint spaces are preserved. The known soft tissue ulceration is not well characterized on radiograph. No radiopaque foreign bodies are seen. IMPRESSION: No evidence of fracture or dislocation.  No osseous erosions seen. Electronically Signed   By: Roanna Raider M.D.   On: 05/20/2017 05:10   Ct Head Wo Contrast  Result Date: 05/19/2017 CLINICAL DATA:  71 year old female with frequent falls. Evaluate for intracranial hemorrhage. EXAM: CT HEAD WITHOUT CONTRAST TECHNIQUE: Contiguous axial images were obtained from the base  of the skull through the vertex without intravenous contrast. COMPARISON:  Head CT dated 02/23/2017 FINDINGS: Evaluation of this exam is limited due to motion artifact. Brain: The ventricles and sulci appropriate size for patient's age. Mild periventricular and deep white matter chronic microvascular ischemic changes noted. There is no acute intracranial hemorrhage. No mass effect or midline shift noted. No extra-axial fluid collection. Vascular: No hyperdense vessel or unexpected calcification. Skull: Normal. Negative for fracture or focal lesion. Sinuses/Orbits: No acute finding. Other: None IMPRESSION: 1. No acute intracranial hemorrhage. 2. Mild chronic microvascular ischemic changes. Electronically Signed   By: Elgie CollardArash  Radparvar M.D.   On: 05/19/2017 20:24   Ct Angio Chest Pe W Or Wo Contrast  Result Date: 05/19/2017 CLINICAL DATA:  Acute respiratory failure with hypoxia EXAM: CT ANGIOGRAPHY CHEST WITH CONTRAST TECHNIQUE: Multidetector CT imaging of the chest was performed using the  standard protocol during bolus administration of intravenous contrast. Multiplanar CT image reconstructions and MIPs were obtained to evaluate the vascular anatomy. CONTRAST:  75 cc of Isovue 370 COMPARISON:  None FINDINGS: Cardiovascular: Satisfactory opacification of the pulmonary arteries to the segmental level. No evidence of pulmonary embolism. Normal heart size. No pericardial effusion. Aortic atherosclerosis noted. Calcification in the LAD coronary artery noted. Mediastinum/Nodes: The trachea appears patent and is midline. Normal appearance of the esophagus. No enlarged mediastinal or hilar lymph nodes. No supraclavicular or axillary adenopathy. Lungs/Pleura: Scar versus subsegmental atelectasis noted within the lingula and right middle lobe. Upper Abdomen: No acute abnormality. Musculoskeletal: There is degenerative disc disease noted throughout the thoracic spine. No aggressive lytic for sclerotic bone lesions identified. Review of the MIP images confirms the above findings. IMPRESSION: 1. No acute pulmonary embolus. 2. Aortic Atherosclerosis (ICD10-I70.0). LAD coronary artery calcification noted. 3. Right middle lobe and lingular subsegmental atelectasis. Electronically Signed   By: Signa Kellaylor  Stroud M.D.   On: 05/19/2017 20:28    Scheduled Meds: . ALPRAZolam  1 mg Oral BID  . aspirin EC  81 mg Oral Daily  . atorvastatin  20 mg Oral QHS  . docusate sodium  100 mg Oral BID  . enoxaparin (LOVENOX) injection  40 mg Subcutaneous Q24H  . insulin aspart  0-9 Units Subcutaneous TID AC & HS  . insulin glargine  20 Units Subcutaneous QHS  . liothyronine  25 mcg Oral Daily  . pantoprazole  40 mg Oral Daily  . pregabalin  150 mg Oral BID  . venlafaxine XR  150 mg Oral Q breakfast   Continuous Infusions: . cefTRIAXone (ROCEPHIN)  IV      Assessment/Plan:  1. Acute metabolic encephalopathy. Likely secondary to polypharmacy and not eating well at home. Discontinue long-acting pain medication and use  short acting pain medication as needed. 2. Acute cystitis with hematuria and bilateral ankle ulcerations. IV Rocephin would cover both. Send off urine culture which was not sent from the ER. Check a sedimentation rate. Ankle x-rays do not show any signs of osteomyelitis. Will need follow-up with either podiatry or wound care as outpatient 3. Anxiety and depression continue her same dose of Xanax 1 mg twice a day and Effexor 4. Chronic back pain. Stop long-acting pain medications and use short acting pain medications only 5. Hypothyroidism unspecified on liothyronine 6. Hyperlipidemia unspecified on atorvastatin 7. Type 2 diabetes mellitus on glargine insulin and sliding scale 8. Weakness. Physical therapy evaluation 9. Lactic acidosis resolved with IV fluid hydration  Code Status:     Code Status Orders  Start     Ordered   05/19/17 2145  Do not attempt resuscitation (DNR)  Continuous    Question Answer Comment  In the event of cardiac or respiratory ARREST Do not call a "code blue"   In the event of cardiac or respiratory ARREST Do not perform Intubation, CPR, defibrillation or ACLS   In the event of cardiac or respiratory ARREST Use medication by any route, position, wound care, and other measures to relive pain and suffering. May use oxygen, suction and manual treatment of airway obstruction as needed for comfort.      05/19/17 2144    Code Status History    Date Active Date Inactive Code Status Order ID Comments User Context   02/23/2017  4:40 PM 03/01/2017  7:02 PM DNR [id number]  Altamese Dilling, MD Inpatient   01/17/2017  4:07 AM 01/19/2017  8:10 PM DNR [id number]  Arnaldo Natal, MD Inpatient   04/09/2016  3:11 AM 04/10/2016  8:44 PM DNR [id number]  Arnaldo Natal, MD Inpatient   04/09/2016  2:05 AM 04/09/2016  3:11 AM Full Code [id number]  Oralia Manis, MD Inpatient   04/03/2016  6:53 PM 04/04/2016  6:17 PM Full Code [id number]  Houston Siren, MD Inpatient    02/23/2016 10:52 PM 02/25/2016  4:37 PM DNR [id number]  Robley Fries, MD Inpatient   01/24/2016  3:54 PM 01/27/2016  7:06 PM DNR [id number]  Milagros Loll, MD Inpatient   01/23/2016  4:07 PM 01/23/2016  4:22 PM Full Code [id number]  Shaune Pollack, MD Inpatient   12/07/2015 10:23 PM 12/09/2015  6:58 PM Full Code [id number]  Auburn Bilberry, MD Inpatient   11/29/2015  5:41 PM 12/01/2015  6:33 PM DNR [id number]  Shaune Pollack, MD Inpatient   11/10/2015  9:54 AM 11/12/2015  9:39 PM DNR [id number]  Linus Galas, MD Inpatient   11/07/2015  5:32 PM 11/10/2015  9:54 AM DNR [id number]  Altamese Dilling, MD Inpatient   11/07/2015  3:49 PM 11/07/2015  5:32 PM Full Code [id number]  Altamese Dilling, MD Inpatient    Advance Directive Documentation     Most Recent Value  Type of Advance Directive  Out of facility DNR (pink MOST or yellow form)  Pre-existing out of facility DNR order (yellow form or pink MOST form)  Yellow form placed in chart (order not valid for inpatient use)  "MOST" Form in Place?  -      Disposition Plan: Physical therapy recommended home with home health.  Antibiotics:  IV Rocephin  Time spent: 35 minutes  Alford Highland  [employer]

## 2017-05-20 NOTE — Care Management Note (Signed)
Case Management Note  Patient Details  Name: Jessica Donovan MRN: [id number] Date of Birth: 07/31/1946  Subjective/Objective:  Met with patient at bedside to discuss home health PT services. Strongly encouraged her to accept services and she declined any services.                 Action/Plan: Declined home health PT services.   Expected Discharge Date:  04/22/18               Expected Discharge Plan:     In-House Referral:     Discharge planning Services  CM Consult  Post Acute Care Choice:  Home Health Choice offered to:  Patient  DME Arranged:    DME Agency:     HH Arranged:  Patient Refused [place] Agency:     Status of Service:  Completed, signed off  If discussed at [employer] of Stay Meetings, dates discussed:    Additional Comments:  Jolly Mango, RN 05/20/2017, 2:52 PM

## 2017-05-20 NOTE — Progress Notes (Signed)
Pharmacy Antibiotic Note  Jessica Donovan is a 71 y.o. female admitted on 05/19/2017 with  UTI.  Pharmacy has been consulted for Ceftriaxone dosing. Patient originally received Zosyn and Vancomycin.   Plan: Will start patient on Ceftriaxone 1g IV every 24 hours.   Height: 5\' 3"  (160 cm) Weight: 191 lb (86.6 kg) IBW/kg (Calculated) : 52.4  Temp (24hrs), Avg:98.3 F (36.8 C), Min:98.1 F (36.7 C), Max:98.8 F (37.1 C)   Recent Labs Lab 05/19/17 1803 05/19/17 2153 05/20/17 0338  WBC 9.2  --  7.3  CREATININE 1.05*  --  0.80  LATICACIDVEN 3.4* 2.7* 1.9    Estimated Creatinine Clearance: 68.3 mL/min (by C-G formula based on SCr of 0.8 mg/dL).    Allergies  Allergen Reactions  . Codeine Nausea And Vomiting  . Influenza Vaccines Other (See Comments)    Reaction:  Caused pt to pass out   . Methadone Hives and Itching  . Percocet [Oxycodone-Acetaminophen] Nausea And Vomiting  . Tetanus Toxoids Swelling and Other (See Comments)    Reaction:  Swelling at injection site  . Tetracyclines & Related Rash    Antimicrobials this admission: 5/30 Zosyn/Vanco  >> 5/31 5/31 ceftriaxone >>  Dose adjustments this admission:  Microbiology results: 5/30  BCx: NG TD 5/30  UCx:  5/30  MRSA PCR: negative   Thank you for allowing pharmacy to be a part of this patient's care.  Gardner CandleSheema M Blayke Pinera, PharmD, BCPS Clinical Pharmacist 05/20/2017 10:58 AM

## 2017-05-20 NOTE — Evaluation (Signed)
Physical Therapy Evaluation Patient Details Name: Jessica Donovan MRN: [id number] DOB: [birth year] Today's Date: 05/20/2017   History of Present Illness  presented to ER secondary to urinary frequency, mild AMS; admitted with UTI, cellulitis R ankle.  Clinical Impression  Upon evaluation, patient alert and oriented to basic information; follows simple commands and demonstrates fair/good insight and safety awareness.  Bilat UE/LE strength and ROM grossly WFL for basic transfers/mobility (L LE with slight weakness, history of previous CVA).  Demonstrates ability to complete bed mobility with mod indep; sit/stand, basic transfers and gait (200') with RW, cga/min assist.  Decreased balance reactions evident in bilat hips/ankles, decreased cadence and gait speed; all indicative of increased fall risk with functional activities. Would benefit from skilled PT to address above deficits and promote optimal return to PLOF; Recommend transition to HHPT upon discharge from acute hospitalization.     Follow Up Recommendations Home health PT    Equipment Recommendations   (has rollator at home)    Recommendations for Other Services       Precautions / Restrictions Precautions Precautions: Fall Restrictions Weight Bearing Restrictions: No      Mobility  Bed Mobility Overal bed mobility: Modified Independent                Transfers Overall transfer level: Needs assistance Equipment used: Rolling walker (2 wheeled) Transfers: Sit to/from Stand Sit to Stand: Min guard;Min assist         General transfer comment: requires multiple attempts and use of bilat UEs to lift off from seating surface; broad BOS, limited balance reactions evident in bilat hips/ankles  Ambulation/Gait Ambulation/Gait assistance: Min guard;Min assist Ambulation Distance (Feet): 200 Feet Assistive device: Rolling walker (2 wheeled)   Gait velocity: 10' walk time, 12 seconds   General Gait Details: reciprocal  stepping pattern with fair step heigth/length; forward flexed posture with downward gaze (limited ability to correct, reports history of previous back surgery x3).  Gait slow but without overt buckling or LOB.  Stairs            Wheelchair Mobility    Modified Rankin (Stroke Patients Only)       Balance Overall balance assessment: Needs assistance Sitting-balance support: No upper extremity supported;Feet supported Sitting balance-Leahy Scale: Good     Standing balance support: Bilateral upper extremity supported Standing balance-Leahy Scale: Fair                               Pertinent Vitals/Pain Pain Assessment: Faces Faces Pain Scale: Hurts little more Pain Location: R ankle Pain Descriptors / Indicators: Aching;Grimacing Pain Intervention(s): Limited activity within patient's tolerance;Monitored during session;Repositioned    Home Living Family/patient expects to be discharged to:: Private residence Living Arrangements: Alone Available Help at Discharge: Friend(s) Type of Home:  (Multi-level indep living facility [facility] [place])) Home Access: Elevator     Home Layout: One level Home Equipment: Cane - single point;Walker - 4 wheels;Shower seat;Grab bars - tub/shower      Prior Function Level of Independence: Independent with assistive device(s)         Comments: Independent with ADLs/IADLs. Rides in apartment [place] for transportation. Pt reports multiple fall history, unable to quantify. Uses rollator for all ADLs and functional mobility.     Hand Dominance   Dominant Hand: Right    Extremity/Trunk Assessment   Upper Extremity Assessment Upper Extremity Assessment: Overall WFL for tasks assessed    Lower Extremity Assessment  Lower Extremity Assessment: Overall WFL for tasks assessed (R LE 5/5, L LE 4+/5 (history of previous CVA))       Communication   Communication: No difficulties  Cognition Arousal/Alertness:  Awake/alert Behavior During Therapy: WFL for tasks assessed/performed Overall Cognitive Status: Within Functional Limits for tasks assessed                                        General Comments      Exercises     Assessment/Plan    PT Assessment Patient needs continued PT services  PT Problem List Decreased strength;Decreased activity tolerance;Decreased balance;Decreased mobility;Decreased coordination;Decreased cognition;Decreased knowledge of use of DME;Decreased knowledge of precautions;Decreased safety awareness       PT Treatment Interventions DME instruction;Gait training;Functional mobility training;Therapeutic activities;Therapeutic exercise;Balance training;Patient/family education    PT Goals (Current goals can be found in the Care Plan section)  Acute Rehab PT Goals Patient Stated Goal: to return home PT Goal Formulation: With patient Time For Goal Achievement: 06/03/17 Potential to Achieve Goals: Good    Frequency Min 2X/week   Barriers to discharge Decreased caregiver support      Co-evaluation               AM-PAC PT "6 Clicks" Daily Activity  Outcome Measure Difficulty turning over in bed (including adjusting bedclothes, sheets and blankets)?: None Difficulty moving from lying on back to sitting on the side of the bed? : None Difficulty sitting down on and standing up from a chair with arms (e.g., wheelchair, bedside commode, etc,.)?: Total Help needed moving to and from a bed to chair (including a wheelchair)?: A Little Help needed walking in hospital room?: A Little Help needed climbing 3-5 steps with a railing? : A Lot 6 Click Score: 17    End of Session Equipment Utilized During Treatment: Gait belt Activity Tolerance: Patient tolerated treatment well Patient left: in bed;with call bell/phone within reach;with bed alarm set   PT Visit Diagnosis: Unsteadiness on feet (R26.81);Muscle weakness (generalized) (M62.81)     Time: [id number] PT Time Calculation (min) (ACUTE ONLY): 18 min   Charges:   PT Evaluation $PT Eval Low Complexity: 1 Procedure     PT G Codes:        Delberta Folts H. Manson PasseyBrown, PT, DPT, NCS 05/20/17, 10:34 AM [phone]

## 2017-05-20 NOTE — Progress Notes (Signed)
Inpatient Diabetes Program Recommendations  AACE/ADA: New Consensus Statement on Inpatient Glycemic Control (2015)  Target Ranges:  Prepandial:   less than 140 mg/dL      Peak postprandial:   less than 180 mg/dL (1-2 hours)      Critically ill patients:  140 - 180 mg/dL   Lab Results  Component Value Date   GLUCAP 256 (H) 05/20/2017   HGBA1C 6.7 (H) 01/17/2017    Review of Glycemic Control:  Results for Mellody DanceGLASS, Stephanine (MRN [id number]) as of 05/20/2017 11:48  Ref. Range 05/19/2017 21:51 05/20/2017 07:29 05/20/2017 11:31  Glucose-Capillary Latest Ref Range: 65 - 99 mg/dL 119212 (H) 147110 (H) 829256 (H)   Diabetes history: Type 2 diabetes Outpatient Diabetes medications: Novolog 3-15 units tid with meals, Toujeo 30 units daily Current orders for Inpatient glycemic control:  Novolog sensitive tid with meals and HS  Inpatient Diabetes Program Recommendations:   Consider restarting a portion of patient's basal insulin.  Consider adding Lantus 15 units daily.  Also consider adding Novolog meal coverage 3 units tid with meals-hold if patient eats less than 50%.    Thanks, Beryl MeagerJenny Caelan Atchley, RN, BC-ADM Inpatient Diabetes Coordinator Pager [phone] (8a-5p)

## 2017-05-20 NOTE — Progress Notes (Signed)
Pt became more alert as the shift progressed. Able to call out and make needs known. Using bedpan to void. Iv infusing without difficulty. Pt still remaining on 3L of oxygen.

## 2017-05-21 ENCOUNTER — Inpatient Hospital Stay: Payer: PPO

## 2017-05-21 ENCOUNTER — Ambulatory Visit: Payer: Self-pay | Admitting: *Deleted

## 2017-05-21 LAB — GLUCOSE, CAPILLARY
GLUCOSE-CAPILLARY: 143 mg/dL — AB (ref 65–99)
Glucose-Capillary: 128 mg/dL — ABNORMAL HIGH (ref 65–99)
Glucose-Capillary: 133 mg/dL — ABNORMAL HIGH (ref 65–99)
Glucose-Capillary: 115 mg/dL — ABNORMAL HIGH (ref 65–99)

## 2017-05-21 LAB — HEMOGLOBIN A1C
Hgb A1c MFr Bld: 7.1 % — ABNORMAL HIGH (ref 4.8–5.6)
MEAN PLASMA GLUCOSE: 157 mg/dL

## 2017-05-21 MED ORDER — VENLAFAXINE HCL ER 75 MG PO CP24
75.0000 mg | ORAL_CAPSULE | Freq: Once | ORAL | Status: AC
  Administered 2017-05-21: 75 mg via ORAL
  Filled 2017-05-21: qty 1

## 2017-05-21 MED ORDER — IOPAMIDOL (ISOVUE-300) INJECTION 61%
100.0000 mL | Freq: Once | INTRAVENOUS | Status: AC | PRN
  Administered 2017-05-21: 100 mL via INTRAVENOUS

## 2017-05-21 MED ORDER — IOPAMIDOL (ISOVUE-300) INJECTION 61%
15.0000 mL | INTRAVENOUS | Status: AC
  Administered 2017-05-21 (×2): 15 mL via ORAL

## 2017-05-21 MED ORDER — VENLAFAXINE HCL ER 75 MG PO CP24
225.0000 mg | ORAL_CAPSULE | Freq: Every day | ORAL | Status: DC
  Administered 2017-05-22: 225 mg via ORAL
  Filled 2017-05-21 (×3): qty 3

## 2017-05-21 MED ORDER — METOPROLOL TARTRATE 25 MG PO TABS
25.0000 mg | ORAL_TABLET | Freq: Two times a day (BID) | ORAL | Status: DC
  Administered 2017-05-21 – 2017-05-23 (×5): 25 mg via ORAL
  Filled 2017-05-21 (×5): qty 1

## 2017-05-21 NOTE — Care Management Important Message (Signed)
Important Message  Patient Details  Name: Jessica Donovan MRN: [id number] Date of Birth: 1946/09/08   Medicare Important Message Given:  Yes    Marily MemosLisa M Cayne Yom, RN 05/21/2017, 9:26 AM

## 2017-05-21 NOTE — Progress Notes (Signed)
Clinical Child psychotherapistocial Worker (CSW) received a call from [facility] [place]. Per [name] patient's APS case is closed because patient refused services APS offered. Plan is for patient to D/C to home. PT is recommending home health. Per RN case manager patient refused home health. Please reconsult if future social work needs arise. CSW signing off.   Baker Hughes IncorporatedBailey Desma Wilkowski, LCSW [phone]

## 2017-05-21 NOTE — Progress Notes (Signed)
Patient ID: Mellody DanceBarbara Mathena, female   DOB: 1946-08-30, 71 y.o.   MRN: [id number]  [facility] PROGRESS NOTE  Mellody DanceBarbara Marsan NFA:[id number] DOB: 1946-08-30 DOA: 05/19/2017 PCP: Garlon HatchetMcConville, Robert, MD  HPI/Subjective: Patient seen this morning. Had 2 episodes of diarrhea this morning. 2 episodes of diarrhea last night. Prior to that had constipation. Having severe abdominal pain in the lower abdomen.  Objective: Vitals:   05/21/17 0748 05/21/17 1024  BP: (!) 163/76 (!) 146/72  Pulse: 86 82  Resp: 16   Temp: 98.8 F (37.1 C)     Filed Weights   05/19/17 2147  Weight: 86.6 kg (191 lb)    ROS: Review of Systems  Constitutional: Negative for chills and fever.  Eyes: Negative for blurred vision.  Respiratory: Negative for cough and shortness of breath.   Cardiovascular: Negative for chest pain.  Gastrointestinal: Positive for abdominal pain, diarrhea and nausea. Negative for constipation and vomiting.  Genitourinary: Negative for dysuria.  Musculoskeletal: Positive for back pain. Negative for joint pain.  Neurological: Negative for dizziness and headaches.   Exam: Physical Exam  Constitutional: She is oriented to person, place, and time.  HENT:  Nose: No mucosal edema.  Mouth/Throat: No oropharyngeal exudate.  Eyes: Conjunctivae, EOM and lids are normal. Pupils are equal, round, and reactive to light.  Neck: Carotid bruit is not present. No thyromegaly present.  Cardiovascular: Regular rhythm, S1 normal, S2 normal and normal heart sounds.   Pulses:      Dorsalis pedis pulses are 1+ on the right side, and 1+ on the left side.  Respiratory: No respiratory distress. She has no decreased breath sounds. She has no wheezes. She has no rhonchi. She has no rales.  GI: Soft. Bowel sounds are normal. There is tenderness in the suprapubic area.  Musculoskeletal:       Right ankle: She exhibits no swelling.       Left ankle: She exhibits no swelling.  Lymphadenopathy:    She has no  cervical adenopathy.  Neurological: She is alert and oriented to person, place, and time.  Skin: Skin is warm. No rash noted.  Bilateral lateral ankle ulcerations with slight redness surrounding.  Psychiatric: She has a normal mood and affect.      Data Reviewed: Basic Metabolic Panel:  Recent Labs Lab 05/19/17 1803 05/20/17 0338  NA 138 141  K 4.4 3.9  CL 104 107  CO2 24 28  GLUCOSE 275* 174*  BUN 44* 30*  CREATININE 1.05* 0.80  CALCIUM 9.2 8.8*   Liver Function Tests:  Recent Labs Lab 05/19/17 1803  AST 163*  ALT 45  ALKPHOS 87  BILITOT 0.5  PROT 6.6  ALBUMIN 3.9   CBC:  Recent Labs Lab 05/19/17 1803 05/20/17 0338  WBC 9.2 7.3  NEUTROABS 7.5*  --   HGB 12.7 12.1  HCT 38.0 35.7  MCV 96.3 96.7  PLT 224 186   Cardiac Enzymes:  Recent Labs Lab 05/19/17 1803  TROPONINI <0.03    CBG:  Recent Labs Lab 05/20/17 1131 05/20/17 1632 05/20/17 2105 05/21/17 0742 05/21/17 1128  GLUCAP 256* 130* 190* 128* 133*    Recent Results (from the past 240 hour(s))  Culture, blood (Routine X 2) w Reflex to ID Panel     Status: None (Preliminary result)   Collection Time: 05/19/17  9:53 PM  Result Value Ref Range Status   Specimen Description BLOOD BLOOD LEFT ARM  Final   Special Requests   Final    BOTTLES DRAWN  AEROBIC AND ANAEROBIC Blood Culture adequate volume   Culture NO GROWTH 2 DAYS  Final   Report Status PENDING  Incomplete  Culture, blood (Routine X 2) w Reflex to ID Panel     Status: None (Preliminary result)   Collection Time: 05/19/17 10:30 PM  Result Value Ref Range Status   Specimen Description BLOOD RIGHT HAND  Final   Special Requests BOTTLES DRAWN AEROBIC AND ANAEROBIC BCAV  Final   Culture NO GROWTH 2 DAYS  Final   Report Status PENDING  Incomplete  MRSA PCR Screening     Status: None   Collection Time: 05/19/17 11:00 PM  Result Value Ref Range Status   MRSA by PCR NEGATIVE NEGATIVE Final    Comment:        The GeneXpert MRSA Assay  (FDA approved for NASAL specimens only), is one component of a comprehensive MRSA colonization surveillance program. It is not intended to diagnose MRSA infection nor to guide or monitor treatment for MRSA infections.      Studies: Dg Chest 2 View  Result Date: 05/19/2017 CLINICAL DATA:  Acute hypoxia.  Concern for pneumonia. EXAM: CHEST  2 VIEW COMPARISON:  02/23/2017 FINDINGS: Again noted are low lung volumes. There is no focal airspace disease or pulmonary edema. Heart and mediastinum are within normal limits and stable. Negative for pneumothorax. No large pleural effusions. Multilevel degenerative changes in the thoracic spine. IMPRESSION: Low lung volumes without acute findings. Electronically Signed   By: Richarda Overlie M.D.   On: 05/19/2017 18:58   Dg Ankle Complete Left  Result Date: 05/20/2017 CLINICAL DATA:  Ulceration at the lateral left ankle, acute onset. Initial encounter. EXAM: LEFT ANKLE COMPLETE - 3+ VIEW COMPARISON:  Left foot radiographs performed 11/07/2015 FINDINGS: There is no evidence of fracture or dislocation. The ankle mortise is intact; the interosseous space is within normal limits. No talar tilt or subluxation is seen. An os peroneum is noted. Large plantar and posterior calcaneal spurs are seen, with scattered osseous fragments overlying the plantar fascia. The joint spaces are preserved. A soft tissue ulceration is noted overlying the distal fibula. No osseous erosion is seen. IMPRESSION: 1. No evidence of fracture or dislocation. No evidence of osseous erosion. 2. Os peroneum noted. 3. Soft tissue ulceration overlying the distal fibula. Electronically Signed   By: Roanna Raider M.D.   On: 05/20/2017 05:08   Dg Ankle Complete Right  Result Date: 05/20/2017 CLINICAL DATA:  Right ankle ulceration, acute onset. Initial encounter. EXAM: RIGHT ANKLE - COMPLETE 3+ VIEW COMPARISON:  Right foot radiographs performed 04/03/2016 FINDINGS: There is no evidence of fracture or  dislocation. No osseous erosions are seen. The ankle mortise is intact; the interosseous space is within normal limits. No talar tilt or subluxation is seen. A small osseous fragment is noted adjacent to the medial malleolus. Plantar and posterior calcaneal spurs are seen. The joint spaces are preserved. The known soft tissue ulceration is not well characterized on radiograph. No radiopaque foreign bodies are seen. IMPRESSION: No evidence of fracture or dislocation.  No osseous erosions seen. Electronically Signed   By: Roanna Raider M.D.   On: 05/20/2017 05:10   Ct Head Wo Contrast  Result Date: 05/19/2017 CLINICAL DATA:  71 year old female with frequent falls. Evaluate for intracranial hemorrhage. EXAM: CT HEAD WITHOUT CONTRAST TECHNIQUE: Contiguous axial images were obtained from the base of the skull through the vertex without intravenous contrast. COMPARISON:  Head CT dated 02/23/2017 FINDINGS: Evaluation of this exam is limited  due to motion artifact. Brain: The ventricles and sulci appropriate size for patient's age. Mild periventricular and deep white matter chronic microvascular ischemic changes noted. There is no acute intracranial hemorrhage. No mass effect or midline shift noted. No extra-axial fluid collection. Vascular: No hyperdense vessel or unexpected calcification. Skull: Normal. Negative for fracture or focal lesion. Sinuses/Orbits: No acute finding. Other: None IMPRESSION: 1. No acute intracranial hemorrhage. 2. Mild chronic microvascular ischemic changes. Electronically Signed   By: Elgie Collard M.D.   On: 05/19/2017 20:24   Ct Angio Chest Pe W Or Wo Contrast  Result Date: 05/19/2017 CLINICAL DATA:  Acute respiratory failure with hypoxia EXAM: CT ANGIOGRAPHY CHEST WITH CONTRAST TECHNIQUE: Multidetector CT imaging of the chest was performed using the standard protocol during bolus administration of intravenous contrast. Multiplanar CT image reconstructions and MIPs were obtained to  evaluate the vascular anatomy. CONTRAST:  75 cc of Isovue 370 COMPARISON:  None FINDINGS: Cardiovascular: Satisfactory opacification of the pulmonary arteries to the segmental level. No evidence of pulmonary embolism. Normal heart size. No pericardial effusion. Aortic atherosclerosis noted. Calcification in the LAD coronary artery noted. Mediastinum/Nodes: The trachea appears patent and is midline. Normal appearance of the esophagus. No enlarged mediastinal or hilar lymph nodes. No supraclavicular or axillary adenopathy. Lungs/Pleura: Scar versus subsegmental atelectasis noted within the lingula and right middle lobe. Upper Abdomen: No acute abnormality. Musculoskeletal: There is degenerative disc disease noted throughout the thoracic spine. No aggressive lytic for sclerotic bone lesions identified. Review of the MIP images confirms the above findings. IMPRESSION: 1. No acute pulmonary embolus. 2. Aortic Atherosclerosis (ICD10-I70.0). LAD coronary artery calcification noted. 3. Right middle lobe and lingular subsegmental atelectasis. Electronically Signed   By: Signa Kell M.D.   On: 05/19/2017 20:28    Scheduled Meds: . ALPRAZolam  1 mg Oral BID  . aspirin EC  81 mg Oral Daily  . atorvastatin  20 mg Oral QHS  . docusate sodium  100 mg Oral BID  . enoxaparin (LOVENOX) injection  40 mg Subcutaneous Q24H  . insulin aspart  0-9 Units Subcutaneous TID AC & HS  . insulin glargine  20 Units Subcutaneous QHS  . metoprolol tartrate  25 mg Oral BID  . pantoprazole  40 mg Oral Daily  . pregabalin  150 mg Oral BID  . [START ON 05/22/2017] venlafaxine XR  225 mg Oral Q breakfast   Continuous Infusions: . cefTRIAXone (ROCEPHIN)  IV Stopped (05/20/17 1741)    Assessment/Plan:  1. Lower abdominal pain, some episodes of diarrhea. CT scan of the abdomen and pelvis today. Continue to monitor closely. Patient received a dose of short acting pain medication this morning. Can continue short acting pain medication  as needed. 2. Acute metabolic encephalopathy. Likely secondary to polypharmacy and not eating well at home. Discontinued long-acting pain medication and use short acting pain medication as needed. 3. Acute cystitis with hematuria and bilateral ankle ulcerations. IV Rocephin would cover both. Unfortunately urine culture was not sent from the ER and I was unable to add on a urine culture yesterday.  Sedimentation rate normal range. Ankle x-rays do not show any signs of osteomyelitis. Will need follow-up with either podiatry or wound care as outpatient. 4. Anxiety and depression continue her same dose of Xanax 1 mg twice a day and Effexor 5. Chronic back pain. Stopped long-acting pain medications and use short acting pain medications only 6. Weight gain. Check a TSH. Patient states that she does not take thyroid medication. 7. Hyperlipidemia unspecified  on atorvastatin 8. Type 2 diabetes mellitus on glargine insulin and sliding scale 9. Weakness. Physical therapy recommended home with home health but the patient will refuse. 10. Lactic acidosis resolved with IV fluid hydration  Code Status:     Code Status Orders        Start     Ordered   05/19/17 2145  Do not attempt resuscitation (DNR)  Continuous    Question Answer Comment  In the event of cardiac or respiratory ARREST Do not call a "code blue"   In the event of cardiac or respiratory ARREST Do not perform Intubation, CPR, defibrillation or ACLS   In the event of cardiac or respiratory ARREST Use medication by any route, position, wound care, and other measures to relive pain and suffering. May use oxygen, suction and manual treatment of airway obstruction as needed for comfort.      05/19/17 2144    Code Status History    Date Active Date Inactive Code Status Order ID Comments User Context   02/23/2017  4:40 PM 03/01/2017  7:02 PM DNR [id number]  Altamese Dilling, MD Inpatient   01/17/2017  4:07 AM 01/19/2017  8:10 PM DNR [id number]   Arnaldo Natal, MD Inpatient   04/09/2016  3:11 AM 04/10/2016  8:44 PM DNR [id number]  Arnaldo Natal, MD Inpatient   04/09/2016  2:05 AM 04/09/2016  3:11 AM Full Code [id number]  Oralia Manis, MD Inpatient   04/03/2016  6:53 PM 04/04/2016  6:17 PM Full Code [id number]  Houston Siren, MD Inpatient   02/23/2016 10:52 PM 02/25/2016  4:37 PM DNR [id number]  Robley Fries, MD Inpatient   01/24/2016  3:54 PM 01/27/2016  7:06 PM DNR [id number]  Milagros Loll, MD Inpatient   01/23/2016  4:07 PM 01/23/2016  4:22 PM Full Code [id number]  Shaune Pollack, MD Inpatient   12/07/2015 10:23 PM 12/09/2015  6:58 PM Full Code [id number]  Auburn Bilberry, MD Inpatient   11/29/2015  5:41 PM 12/01/2015  6:33 PM DNR [id number]  Shaune Pollack, MD Inpatient   11/10/2015  9:54 AM 11/12/2015  9:39 PM DNR [id number]  Linus Galas, MD Inpatient   11/07/2015  5:32 PM 11/10/2015  9:54 AM DNR [id number]  Altamese Dilling, MD Inpatient   11/07/2015  3:49 PM 11/07/2015  5:32 PM Full Code [id number]  Altamese Dilling, MD Inpatient    Advance Directive Documentation     Most Recent Value  Type of Advance Directive  Out of facility DNR (pink MOST or yellow form)  Pre-existing out of facility DNR order (yellow form or pink MOST form)  Yellow form placed in chart (order not valid for inpatient use)  "MOST" Form in Place?  -      Disposition Plan: Physical therapy recommended home with home health.  Antibiotics:  IV Rocephin  Time spent: 28 minutes  Alford Highland  [employer]

## 2017-05-21 NOTE — Consult Note (Signed)
   [facility]   05/21/2017  Jessica DanceBarbara Donovan Dec 07, 1946 [id number]  [facility] chronic disease management services.  Patient has been engaged by a [employer] Community Care Coordinator and CSW. [facility] she [place] soon as a [place] is availible  Our community based plan of care has focused on disease management and community resource support.  Patient will receive a post discharge transition of care call and will be evaluated for monthly home visits for assessments and disease process education.  Made Inpatient Case Manager aware that [facility] following. Of note, [facility] services does not replace or interfere with any services that are needed or arranged by inpatient case management or social work.  For additional questions or referrals please contact:  Delorise Hunkele RN, BSN [facility]  Hospital Liaison  [phone]) Business Mobile [phone]) Toll free office

## 2017-05-22 LAB — GLUCOSE, CAPILLARY
GLUCOSE-CAPILLARY: 226 mg/dL — AB (ref 65–99)
Glucose-Capillary: 135 mg/dL — ABNORMAL HIGH (ref 65–99)
Glucose-Capillary: 149 mg/dL — ABNORMAL HIGH (ref 65–99)

## 2017-05-22 LAB — CBC
HEMATOCRIT: 39.2 % (ref 35.0–47.0)
Hemoglobin: 13.4 g/dL (ref 12.0–16.0)
MCH: 33 pg (ref 26.0–34.0)
MCHC: 34.1 g/dL (ref 32.0–36.0)
MCV: 96.8 fL (ref 80.0–100.0)
PLATELETS: 198 10*3/uL (ref 150–440)
RBC: 4.05 MIL/uL (ref 3.80–5.20)
RDW: 14 % (ref 11.5–14.5)
WBC: 7.8 10*3/uL (ref 3.6–11.0)

## 2017-05-22 LAB — BASIC METABOLIC PANEL
ANION GAP: 6 (ref 5–15)
BUN: 10 mg/dL (ref 6–20)
CALCIUM: 8.8 mg/dL — AB (ref 8.9–10.3)
CO2: 28 mmol/L (ref 22–32)
CREATININE: 0.53 mg/dL (ref 0.44–1.00)
Chloride: 108 mmol/L (ref 101–111)
Glucose, Bld: 100 mg/dL — ABNORMAL HIGH (ref 65–99)
Potassium: 3.8 mmol/L (ref 3.5–5.1)
SODIUM: 142 mmol/L (ref 135–145)

## 2017-05-22 LAB — GASTROINTESTINAL PANEL BY PCR, STOOL (REPLACES STOOL CULTURE)
ASTROVIRUS: NOT DETECTED
Adenovirus F40/41: NOT DETECTED
CAMPYLOBACTER SPECIES: NOT DETECTED
Cryptosporidium: NOT DETECTED
Cyclospora cayetanensis: NOT DETECTED
ENTEROTOXIGENIC E COLI (ETEC): NOT DETECTED
Entamoeba histolytica: NOT DETECTED
Enteroaggregative E coli (EAEC): NOT DETECTED
Enteropathogenic E coli (EPEC): NOT DETECTED
Giardia lamblia: NOT DETECTED
NOROVIRUS GI/GII: NOT DETECTED
PLESIMONAS SHIGELLOIDES: NOT DETECTED
Rotavirus A: NOT DETECTED
SALMONELLA SPECIES: NOT DETECTED
SAPOVIRUS (I, II, IV, AND V): NOT DETECTED
SHIGELLA/ENTEROINVASIVE E COLI (EIEC): NOT DETECTED
Shiga like toxin producing E coli (STEC): NOT DETECTED
Vibrio cholerae: NOT DETECTED
Vibrio species: NOT DETECTED
Yersinia enterocolitica: NOT DETECTED

## 2017-05-22 LAB — TSH: TSH: 0.904 u[IU]/mL (ref 0.350–4.500)

## 2017-05-22 LAB — C DIFFICILE QUICK SCREEN W PCR REFLEX
C DIFFICILE (CDIFF) INTERP: NOT DETECTED
C DIFFICILE (CDIFF) TOXIN: NEGATIVE
C DIFFICLE (CDIFF) ANTIGEN: NEGATIVE

## 2017-05-22 LAB — VANCOMYCIN, TROUGH: Vancomycin Tr: <4 ug/mL — ABNORMAL LOW (ref 15–20)

## 2017-05-22 MED ORDER — METRONIDAZOLE IN NACL 5-0.79 MG/ML-% IV SOLN
500.0000 mg | Freq: Three times a day (TID) | INTRAVENOUS | Status: DC
  Administered 2017-05-22 – 2017-05-23 (×3): 500 mg via INTRAVENOUS
  Filled 2017-05-22 (×5): qty 100

## 2017-05-22 MED ORDER — MIRTAZAPINE 15 MG PO TABS
15.0000 mg | ORAL_TABLET | Freq: Every day | ORAL | Status: DC
  Administered 2017-05-22: 15 mg via ORAL
  Filled 2017-05-22: qty 1

## 2017-05-22 MED ORDER — TRAZODONE HCL 100 MG PO TABS
100.0000 mg | ORAL_TABLET | Freq: Every day | ORAL | Status: DC
Start: 2017-05-22 — End: 2017-05-23
  Administered 2017-05-22: 100 mg via ORAL
  Filled 2017-05-22: qty 1

## 2017-05-22 MED ORDER — CIPROFLOXACIN IN D5W 400 MG/200ML IV SOLN
400.0000 mg | Freq: Two times a day (BID) | INTRAVENOUS | Status: DC
  Administered 2017-05-22 – 2017-05-23 (×2): 400 mg via INTRAVENOUS
  Filled 2017-05-22 (×4): qty 200

## 2017-05-22 NOTE — Progress Notes (Signed)
Physical Therapy Treatment Patient Details Name: Jessica Donovan MRN: [id number] DOB: June 17, 1946 Today's Date: 05/22/2017    History of Present Illness presented to ER secondary to urinary frequency, mild AMS; admitted with UTI, cellulitis R ankle.    PT Comments    Pt was able to transition out of bed with ease and then ambulate x 2 around unit with walker and supervision.  She did require occasional verbal cues to keep walker closer at times but has no LOB or bucking and minimal fatigue.  Stated she uses a rollator walker at home.     Follow Up Recommendations  Home health PT     Equipment Recommendations       Recommendations for Other Services       Precautions / Restrictions Precautions Precautions: Fall Restrictions Weight Bearing Restrictions: No    Mobility  Bed Mobility Overal bed mobility: Modified Independent                Transfers Overall transfer level: Modified independent Equipment used: Rolling walker (2 wheeled) Transfers: Sit to/from Stand Sit to Stand: Supervision         General transfer comment: stood with ease  Ambulation/Gait Ambulation/Gait assistance: Supervision;Min guard Ambulation Distance (Feet): 360 Feet Assistive device: Rolling walker (2 wheeled)       General Gait Details: verbal cues to keep walker closer but overall does well with safe and steady gait   Stairs            Wheelchair Mobility    Modified Rankin (Stroke Patients Only)       Balance Overall balance assessment: Modified Independent Sitting-balance support: Feet supported Sitting balance-Leahy Scale: Normal     Standing balance support: Bilateral upper extremity supported Standing balance-Leahy Scale: Good                              Cognition Arousal/Alertness: Awake/alert Behavior During Therapy: WFL for tasks assessed/performed Overall Cognitive Status: Within Functional Limits for tasks assessed                                         Exercises      General Comments        Pertinent Vitals/Pain Pain Assessment: No/denies pain    Home Living                      Prior Function            PT Goals (current goals can now be found in the care plan section)      Frequency    Min 2X/week      PT Plan      Co-evaluation              AM-PAC PT "6 Clicks" Daily Activity  Outcome Measure  Difficulty turning over in bed (including adjusting bedclothes, sheets and blankets)?: None Difficulty moving from lying on back to sitting on the side of the bed? : None Difficulty sitting down on and standing up from a chair with arms (e.g., wheelchair, bedside commode, etc,.)?: None Help needed moving to and from a bed to chair (including a wheelchair)?: None Help needed walking in hospital room?: A Little Help needed climbing 3-5 steps with a railing? : A Little 6 Click Score: 22    End of Session Equipment  Utilized During Treatment: Gait belt Activity Tolerance: Patient tolerated treatment well Patient left: in bed;with call bell/phone within reach;with bed alarm set         Time:  -     Charges:                       G Codes:       Danielle DessSarah Yina Riviere, PTA 05/22/17, 10:52 AM

## 2017-05-22 NOTE — Progress Notes (Signed)
Patient ID: Jessica Donovan, female   DOB: May 29, 1946, 71 y.o.   MRN: [id number]  [facility] PROGRESS NOTE  Jessica Donovan DGL:[id number] DOB: 25-Aug-1946 DOA: 05/19/2017 PCP: Jessica Hatchet, MD  HPI/Subjective:  Patient reports solid bowel movement still has some abdominal discomfort  Objective: Vitals:   05/22/17 0813 05/22/17 1343  BP: (!) 147/69 140/66  Pulse: 80 72  Resp: 18 18  Temp: 98 F (36.7 C) 98.5 F (36.9 C)    Filed Weights   05/19/17 2147  Weight: 191 lb (86.6 kg)    ROS: Review of Systems  Constitutional: Negative for chills and fever.  Eyes: Negative for blurred vision.  Respiratory: Negative for cough and shortness of breath.   Cardiovascular: Negative for chest pain.  Gastrointestinal: Positive for abdominal pain, diarrhea and nausea. Negative for constipation and vomiting.  Genitourinary: Negative for dysuria.  Musculoskeletal: Positive for back pain. Negative for joint pain.  Neurological: Negative for dizziness and headaches.   Exam: Physical Exam  Constitutional: She is oriented to person, place, and time.  HENT:  Nose: No mucosal edema.  Mouth/Throat: No oropharyngeal exudate.  Eyes: Conjunctivae, EOM and lids are normal. Pupils are equal, round, and reactive to light.  Neck: Carotid bruit is not present. No thyromegaly present.  Cardiovascular: Regular rhythm, S1 normal, S2 normal and normal heart sounds.   Pulses:      Dorsalis pedis pulses are 1+ on the right side, and 1+ on the left side.  Respiratory: No respiratory distress. She has no decreased breath sounds. She has no wheezes. She has no rhonchi. She has no rales.  GI: Soft. Bowel sounds are normal. There is tenderness in the suprapubic area.  Musculoskeletal:       Right ankle: She exhibits no swelling.       Left ankle: She exhibits no swelling.  Lymphadenopathy:    She has no cervical adenopathy.  Neurological: She is alert and oriented to person, place, and time.  Skin:  Skin is warm. No rash noted.  Bilateral lateral ankle ulcerations with slight redness surrounding.  Psychiatric: She has a normal mood and affect.      Data Reviewed: Basic Metabolic Panel:  Recent Labs Lab 05/19/17 1803 05/20/17 0338 05/22/17 0340  NA 138 141 142  K 4.4 3.9 3.8  CL 104 107 108  CO2 24 28 28   GLUCOSE 275* 174* 100*  BUN 44* 30* 10  CREATININE 1.05* 0.80 0.53  CALCIUM 9.2 8.8* 8.8*   Liver Function Tests:  Recent Labs Lab 05/19/17 1803  AST 163*  ALT 45  ALKPHOS 87  BILITOT 0.5  PROT 6.6  ALBUMIN 3.9   CBC:  Recent Labs Lab 05/19/17 1803 05/20/17 0338 05/22/17 0340  WBC 9.2 7.3 7.8  NEUTROABS 7.5*  --   --   HGB 12.7 12.1 13.4  HCT 38.0 35.7 39.2  MCV 96.3 96.7 96.8  PLT 224 186 198   Cardiac Enzymes:  Recent Labs Lab 05/19/17 1803  TROPONINI <0.03    CBG:  Recent Labs Lab 05/21/17 0742 05/21/17 1128 05/21/17 1656 05/21/17 2114 05/22/17 1132  GLUCAP 128* 133* 115* 143* 226*    Recent Results (from the past 240 hour(s))  Culture, blood (Routine X 2) w Reflex to ID Panel     Status: None (Preliminary result)   Collection Time: 05/19/17  9:53 PM  Result Value Ref Range Status   Specimen Description BLOOD BLOOD LEFT ARM  Final   Special Requests   Final  BOTTLES DRAWN AEROBIC AND ANAEROBIC Blood Culture adequate volume   Culture NO GROWTH 3 DAYS  Final   Report Status PENDING  Incomplete  Culture, blood (Routine X 2) w Reflex to ID Panel     Status: None (Preliminary result)   Collection Time: 05/19/17 10:30 PM  Result Value Ref Range Status   Specimen Description BLOOD RIGHT HAND  Final   Special Requests BOTTLES DRAWN AEROBIC AND ANAEROBIC BCAV  Final   Culture NO GROWTH 3 DAYS  Final   Report Status PENDING  Incomplete  MRSA PCR Screening     Status: None   Collection Time: 05/19/17 11:00 PM  Result Value Ref Range Status   MRSA by PCR NEGATIVE NEGATIVE Final    Comment:        The GeneXpert MRSA Assay  (FDA approved for NASAL specimens only), is one component of a comprehensive MRSA colonization surveillance program. It is not intended to diagnose MRSA infection nor to guide or monitor treatment for MRSA infections.   Gastrointestinal Panel by PCR , Stool     Status: None   Collection Time: 05/22/17  9:30 AM  Result Value Ref Range Status   Campylobacter species NOT DETECTED NOT DETECTED Final   Plesimonas shigelloides NOT DETECTED NOT DETECTED Final   Salmonella species NOT DETECTED NOT DETECTED Final   Yersinia enterocolitica NOT DETECTED NOT DETECTED Final   Vibrio species NOT DETECTED NOT DETECTED Final   Vibrio cholerae NOT DETECTED NOT DETECTED Final   Enteroaggregative E coli (EAEC) NOT DETECTED NOT DETECTED Final   Enteropathogenic E coli (EPEC) NOT DETECTED NOT DETECTED Final   Enterotoxigenic E coli (ETEC) NOT DETECTED NOT DETECTED Final   Shiga like toxin producing E coli (STEC) NOT DETECTED NOT DETECTED Final   Shigella/Enteroinvasive E coli (EIEC) NOT DETECTED NOT DETECTED Final   Cryptosporidium NOT DETECTED NOT DETECTED Final   Cyclospora cayetanensis NOT DETECTED NOT DETECTED Final   Entamoeba histolytica NOT DETECTED NOT DETECTED Final   Giardia lamblia NOT DETECTED NOT DETECTED Final   Adenovirus F40/41 NOT DETECTED NOT DETECTED Final   Astrovirus NOT DETECTED NOT DETECTED Final   Norovirus GI/GII NOT DETECTED NOT DETECTED Final   Rotavirus A NOT DETECTED NOT DETECTED Final   Sapovirus (I, II, IV, and V) NOT DETECTED NOT DETECTED Final  C difficile quick scan w PCR reflex     Status: None   Collection Time: 05/22/17  9:30 AM  Result Value Ref Range Status   C Diff antigen NEGATIVE NEGATIVE Final   C Diff toxin NEGATIVE NEGATIVE Final   C Diff interpretation No C. difficile detected.  Final     Studies: Ct Abdomen Pelvis W Contrast  Result Date: 05/21/2017 CLINICAL DATA:  Patient with history of urinary tract infection. Multiple falls. EXAM: CT ABDOMEN  AND PELVIS WITH CONTRAST TECHNIQUE: Multidetector CT imaging of the abdomen and pelvis was performed using the standard protocol following bolus administration of intravenous contrast. CONTRAST:  ISOVUE-300 IOPAMIDOL (ISOVUE-300) INJECTION 61% COMPARISON:  CT abdomen pelvis 01/19/2017 FINDINGS: Lower chest: Normal heart size. Dependent atelectasis within the lower lobes bilaterally. No pleural effusion. Hepatobiliary: The liver is normal in size and contour. No focal lesion identified. Gallbladder surgically absent. Pancreas: Fatty atrophy of the pancreatic parenchyma. Spleen: Unremarkable Adrenals/Urinary Tract: The adrenal glands are normal. Kidneys enhance symmetrically with contrast. Urinary bladder is unremarkable. Stomach/Bowel: There is circumferential wall thickening of the sigmoid colon. Small amount of surrounding fluid within the pelvis. Stool throughout the colon  as can be seen with constipation. Normal morphology of the stomach. No free intraperitoneal air. Vascular/Lymphatic: Normal caliber abdominal aorta. Peripheral calcified atherosclerotic plaque. No retroperitoneal lymphadenopathy. Reproductive: Surgically absent uterus. Other: None. Musculoskeletal: Lumbar spine degenerative changes. No aggressive or acute appearing osseous lesions. IMPRESSION: Circumferential wall thickening of the sigmoid colon and rectum most compatible with colitis. Considerations include infectious, inflammatory or ischemic etiologies. Large amount of stool throughout the colon to the level of the colonic wall thickening most compatible with secondary constipation. If not previously performed, after resolution of the acute symptomatology, recommend correlation with colonoscopy to exclude the possibility of underlying colonic lesion. These results will be called to the ordering clinician or representative by the Radiologist Assistant, and communication documented in the PACS or zVision Dashboard. Electronically Signed    By: Annia Beltrew  Davis M.D.   On: 05/21/2017 16:12    Scheduled Meds: . ALPRAZolam  1 mg Oral BID  . aspirin EC  81 mg Oral Daily  . atorvastatin  20 mg Oral QHS  . docusate sodium  100 mg Oral BID  . enoxaparin (LOVENOX) injection  40 mg Subcutaneous Q24H  . insulin aspart  0-9 Units Subcutaneous TID AC & HS  . insulin glargine  20 Units Subcutaneous QHS  . metoprolol tartrate  25 mg Oral BID  . mirtazapine  15 mg Oral QHS  . pantoprazole  40 mg Oral Daily  . pregabalin  150 mg Oral BID  . traZODone  100 mg Oral QHS  . venlafaxine XR  225 mg Oral Q breakfast   Continuous Infusions: . ciprofloxacin    . metronidazole 500 mg (05/22/17 1300)    Assessment/Plan:  1. Lower abdominal pain, due to acute colitis C. Difficile ruled out we will treat with Cipro and Flagyl will likely need outpatient GI follow-up with colonoscopy 2. Acute metabolic encephalopathy. Likely secondary to polypharmacy and not eating well at home. Patient's symptoms not improved 3. Acute cystitis with hematuria antibiotics as above 4.  bilateral ankle ulcerations.utpatient podiatry follow-up  5. Anxiety and depression continue her same dose of Xanax 1 mg twice a day and Effexor 6. Chronic back pain. Stopped long-acting pain medications and use short acting pain medications only 7. Weight gain. tsh is normal 8. Hyperlipidemia unspecified on atorvastatin 9. Type 2 diabetes mellitus on glargine insulin and sliding scale 10. Weakness. Physical therapy recommended home with home health but the patient will refuse. 11. Lactic acidosis resolved with IV fluid hydration  Code Status:     Code Status Orders        Start     Ordered   05/19/17 2145  Do not attempt resuscitation (DNR)  Continuous    Question Answer Comment  In the event of cardiac or respiratory ARREST Do not call a "code blue"   In the event of cardiac or respiratory ARREST Do not perform Intubation, CPR, defibrillation or ACLS   In the event of  cardiac or respiratory ARREST Use medication by any route, position, wound care, and other measures to relive pain and suffering. May use oxygen, suction and manual treatment of airway obstruction as needed for comfort.      05/19/17 2144    Code Status History    Date Active Date Inactive Code Status Order ID Comments User Context   02/23/2017  4:40 PM 03/01/2017  7:02 PM DNR [id number]  Altamese DillingVachhani, Vaibhavkumar, MD Inpatient   01/17/2017  4:07 AM 01/19/2017  8:10 PM DNR [id number]  Arnaldo Nataliamond, Michael S, MD  Inpatient   04/09/2016  3:11 AM 04/10/2016  8:44 PM DNR [id number]  Arnaldo Natal, MD Inpatient   04/09/2016  2:05 AM 04/09/2016  3:11 AM Full Code [id number]  Oralia Manis, MD Inpatient   04/03/2016  6:53 PM 04/04/2016  6:17 PM Full Code [id number]  Houston Siren, MD Inpatient   02/23/2016 10:52 PM 02/25/2016  4:37 PM DNR [id number]  Robley Fries, MD Inpatient   01/24/2016  3:54 PM 01/27/2016  7:06 PM DNR [id number]  Milagros Loll, MD Inpatient   01/23/2016  4:07 PM 01/23/2016  4:22 PM Full Code [id number]  Shaune Pollack, MD Inpatient   12/07/2015 10:23 PM 12/09/2015  6:58 PM Full Code [id number]  Auburn Bilberry, MD Inpatient   11/29/2015  5:41 PM 12/01/2015  6:33 PM DNR [id number]  Shaune Pollack, MD Inpatient   11/10/2015  9:54 AM 11/12/2015  9:39 PM DNR [id number]  Linus Galas, MD Inpatient   11/07/2015  5:32 PM 11/10/2015  9:54 AM DNR [id number]  Altamese Dilling, MD Inpatient   11/07/2015  3:49 PM 11/07/2015  5:32 PM Full Code [id number]  Altamese Dilling, MD Inpatient    Advance Directive Documentation     Most Recent Value  Type of Advance Directive  Out of facility DNR (pink MOST or yellow form)  Pre-existing out of facility DNR order (yellow form or pink MOST form)  Yellow form placed in chart (order not valid for inpatient use)  "MOST" Form in Place?  -      Disposition Plan: Physical therapy recommended home with home health.  Antibiotics:  IV Rocephin  Time spent: 28 minutes  Jessica Donovan,  [facility]  [facility]

## 2017-05-22 NOTE — Progress Notes (Signed)
MRSA PCR negative; C Diff was negative. All contact/entric precautions stopped. Louis MeckelMcRae, Ellanora Rayborn H

## 2017-05-23 LAB — GLUCOSE, CAPILLARY
GLUCOSE-CAPILLARY: 211 mg/dL — AB (ref 65–99)
Glucose-Capillary: 115 mg/dL — ABNORMAL HIGH (ref 65–99)

## 2017-05-23 MED ORDER — METRONIDAZOLE 500 MG PO TABS: 500.0000 mg | ORAL_TABLET | Freq: Three times a day (TID) | ORAL | 0 refills | Status: AC

## 2017-05-23 MED ORDER — MORPHINE SULFATE 15 MG PO TABS: 15.0000 mg | ORAL_TABLET | Freq: Three times a day (TID) | ORAL | 0 refills | Status: DC | PRN

## 2017-05-23 MED ORDER — CIPROFLOXACIN HCL 500 MG PO TABS: 500.0000 mg | ORAL_TABLET | Freq: Two times a day (BID) | ORAL | 0 refills | Status: AC

## 2017-05-23 MED ORDER — MORPHINE SULFATE ER 15 MG PO TBCR: 15.0000 mg | EXTENDED_RELEASE_TABLET | Freq: Two times a day (BID) | ORAL | 0 refills | Status: DC

## 2017-05-23 NOTE — Progress Notes (Signed)
discharge instructions explained to pt/ verbalized an understanding/iv and tele removed/ RX given to pt/ will transport off unit when ride arrives

## 2017-05-23 NOTE — Discharge Instructions (Signed)
[facility] - [place] at [facility] ° °DIET:  °Cardiac diet ° °DISCHARGE CONDITION:  °Stable ° °ACTIVITY:  °Activity as tolerated ° °OXYGEN:  °Home Oxygen: No. °  °Oxygen Delivery: room air ° °DISCHARGE LOCATION:  °home  ° ° °ADDITIONAL DISCHARGE INSTRUCTION: ° ° °If you experience worsening of your admission symptoms, develop shortness of breath, life threatening emergency, suicidal or homicidal thoughts you must seek medical attention immediately by calling 911 or calling your MD immediately  if symptoms less severe. ° °You Must read complete instructions/literature along with all the possible adverse reactions/side effects for all the Medicines you take and that have been prescribed to you. Take any new Medicines after you have completely understood and accpet all the possible adverse reactions/side effects.  ° °Please note ° °You were cared for by a hospitalist during your hospital stay. If you have any questions about your discharge medications or the care you received while you were in the hospital after you are discharged, you can call the unit and asked to speak with the hospitalist on call if the hospitalist that took care of you is not available. Once you are discharged, your primary care physician will handle any further medical issues. Please note that NO REFILLS for any discharge medications will be authorized once you are discharged, as it is imperative that you return to your primary care physician (or establish a relationship with a primary care physician if you do not have one) for your aftercare needs so that they can reassess your need for medications and monitor your lab values. ° ° °

## 2017-05-23 NOTE — Discharge Summary (Signed)
[facility] - [place] at [facility], 72 y.o., DOB Oct 24, 1946, MRN [id number]. Admission date: 05/19/2017 Discharge Date 05/23/2017 Primary MD Garlon Hatchet, MD Admitting Physician Oralia Manis, MD  Admission Diagnosis  Lactic acidosis [E87.2] Acute cystitis without hematuria [N30.00] Acute respiratory failure with hypoxia (HCC) [J96.01] Frequent falls [R29.6]  Discharge Diagnosis   Principal Problem: Acute colitis  Cystitis Acute encephalopathy metabolic in nature   Hypotension   DM2 (diabetes mellitus, type 2) (HCC)   Dementia    CAD (coronary artery disease)   Chronic narcotic use   Cellulitis in diabetic foot [facility])         Hospital Course  Jessica Donovan  is a 71 y.o. female who presents with Multiple recent falls. She states that she's been having progressive weakness. She also displays some confusion. She states she's been having significant urinary frequency, as well as dysuria. Here in the ED her UA does show some white cells and some leuk esterase. Patient also on presentation was confused. Therefore she was admitted for further antibiotics and therapy. She was thought to have acute encephalopathy due to combination of infection and medications. Her medications were held. And her infection was treated her mental status improved significantly. She is doing better and is currently at baseline. Patient also developed diarrhea during hospitalization had a CT scan which showed focal colitis. Her antibiotics were switched to Cipro and Flagyl to treat that. I strongly recommend she follow up with GI for colonoscopy. Patient continues to have significant pain and therefore her pain medications are continued.           Consults  None  Significant Tests:  See full reports for all details     Dg Chest 2 View  Result Date: 05/19/2017 CLINICAL DATA:  Acute hypoxia.  Concern for pneumonia. EXAM: CHEST  2 VIEW COMPARISON:  02/23/2017 FINDINGS:  Again noted are low lung volumes. There is no focal airspace disease or pulmonary edema. Heart and mediastinum are within normal limits and stable. Negative for pneumothorax. No large pleural effusions. Multilevel degenerative changes in the thoracic spine. IMPRESSION: Low lung volumes without acute findings. Electronically Signed   By: Richarda Overlie M.D.   On: 05/19/2017 18:58   Dg Ankle Complete Left  Result Date: 05/20/2017 CLINICAL DATA:  Ulceration at the lateral left ankle, acute onset. Initial encounter. EXAM: LEFT ANKLE COMPLETE - 3+ VIEW COMPARISON:  Left foot radiographs performed 11/07/2015 FINDINGS: There is no evidence of fracture or dislocation. The ankle mortise is intact; the interosseous space is within normal limits. No talar tilt or subluxation is seen. An os peroneum is noted. Large plantar and posterior calcaneal spurs are seen, with scattered osseous fragments overlying the plantar fascia. The joint spaces are preserved. A soft tissue ulceration is noted overlying the distal fibula. No osseous erosion is seen. IMPRESSION: 1. No evidence of fracture or dislocation. No evidence of osseous erosion. 2. Os peroneum noted. 3. Soft tissue ulceration overlying the distal fibula. Electronically Signed   By: Roanna Raider M.D.   On: 05/20/2017 05:08   Dg Ankle Complete Right  Result Date: 05/20/2017 CLINICAL DATA:  Right ankle ulceration, acute onset. Initial encounter. EXAM: RIGHT ANKLE - COMPLETE 3+ VIEW COMPARISON:  Right foot radiographs performed 04/03/2016 FINDINGS: There is no evidence of fracture or dislocation. No osseous erosions are seen. The ankle mortise is intact; the interosseous space is within normal limits. No talar tilt or subluxation is seen. A small osseous fragment is noted  adjacent to the medial malleolus. Plantar and posterior calcaneal spurs are seen. The joint spaces are preserved. The known soft tissue ulceration is not well characterized on radiograph. No radiopaque  foreign bodies are seen. IMPRESSION: No evidence of fracture or dislocation.  No osseous erosions seen. Electronically Signed   By: Roanna Raider M.D.   On: 05/20/2017 05:10   Ct Head Wo Contrast  Result Date: 05/19/2017 CLINICAL DATA:  71 year old female with frequent falls. Evaluate for intracranial hemorrhage. EXAM: CT HEAD WITHOUT CONTRAST TECHNIQUE: Contiguous axial images were obtained from the base of the skull through the vertex without intravenous contrast. COMPARISON:  Head CT dated 02/23/2017 FINDINGS: Evaluation of this exam is limited due to motion artifact. Brain: The ventricles and sulci appropriate size for patient's age. Mild periventricular and deep white matter chronic microvascular ischemic changes noted. There is no acute intracranial hemorrhage. No mass effect or midline shift noted. No extra-axial fluid collection. Vascular: No hyperdense vessel or unexpected calcification. Skull: Normal. Negative for fracture or focal lesion. Sinuses/Orbits: No acute finding. Other: None IMPRESSION: 1. No acute intracranial hemorrhage. 2. Mild chronic microvascular ischemic changes. Electronically Signed   By: Elgie Collard M.D.   On: 05/19/2017 20:24   Ct Angio Chest Pe W Or Wo Contrast  Result Date: 05/19/2017 CLINICAL DATA:  Acute respiratory failure with hypoxia EXAM: CT ANGIOGRAPHY CHEST WITH CONTRAST TECHNIQUE: Multidetector CT imaging of the chest was performed using the standard protocol during bolus administration of intravenous contrast. Multiplanar CT image reconstructions and MIPs were obtained to evaluate the vascular anatomy. CONTRAST:  75 cc of Isovue 370 COMPARISON:  None FINDINGS: Cardiovascular: Satisfactory opacification of the pulmonary arteries to the segmental level. No evidence of pulmonary embolism. Normal heart size. No pericardial effusion. Aortic atherosclerosis noted. Calcification in the LAD coronary artery noted. Mediastinum/Nodes: The trachea appears patent and is  midline. Normal appearance of the esophagus. No enlarged mediastinal or hilar lymph nodes. No supraclavicular or axillary adenopathy. Lungs/Pleura: Scar versus subsegmental atelectasis noted within the lingula and right middle lobe. Upper Abdomen: No acute abnormality. Musculoskeletal: There is degenerative disc disease noted throughout the thoracic spine. No aggressive lytic for sclerotic bone lesions identified. Review of the MIP images confirms the above findings. IMPRESSION: 1. No acute pulmonary embolus. 2. Aortic Atherosclerosis (ICD10-I70.0). LAD coronary artery calcification noted. 3. Right middle lobe and lingular subsegmental atelectasis. Electronically Signed   By: Signa Kell M.D.   On: 05/19/2017 20:28   Ct Abdomen Pelvis W Contrast  Result Date: 05/21/2017 CLINICAL DATA:  Patient with history of urinary tract infection. Multiple falls. EXAM: CT ABDOMEN AND PELVIS WITH CONTRAST TECHNIQUE: Multidetector CT imaging of the abdomen and pelvis was performed using the standard protocol following bolus administration of intravenous contrast. CONTRAST:  ISOVUE-300 IOPAMIDOL (ISOVUE-300) INJECTION 61% COMPARISON:  CT abdomen pelvis 01/19/2017 FINDINGS: Lower chest: Normal heart size. Dependent atelectasis within the lower lobes bilaterally. No pleural effusion. Hepatobiliary: The liver is normal in size and contour. No focal lesion identified. Gallbladder surgically absent. Pancreas: Fatty atrophy of the pancreatic parenchyma. Spleen: Unremarkable Adrenals/Urinary Tract: The adrenal glands are normal. Kidneys enhance symmetrically with contrast. Urinary bladder is unremarkable. Stomach/Bowel: There is circumferential wall thickening of the sigmoid colon. Small amount of surrounding fluid within the pelvis. Stool throughout the colon as can be seen with constipation. Normal morphology of the stomach. No free intraperitoneal air. Vascular/Lymphatic: Normal caliber abdominal aorta. Peripheral calcified  atherosclerotic plaque. No retroperitoneal lymphadenopathy. Reproductive: Surgically absent uterus. Other: None. Musculoskeletal: Lumbar  spine degenerative changes. No aggressive or acute appearing osseous lesions. IMPRESSION: Circumferential wall thickening of the sigmoid colon and rectum most compatible with colitis. Considerations include infectious, inflammatory or ischemic etiologies. Large amount of stool throughout the colon to the level of the colonic wall thickening most compatible with secondary constipation. If not previously performed, after resolution of the acute symptomatology, recommend correlation with colonoscopy to exclude the possibility of underlying colonic lesion. These results will be called to the ordering clinician or representative by the Radiologist Assistant, and communication documented in the PACS or zVision Dashboard. Electronically Signed   By: Annia Belt M.D.   On: 05/21/2017 16:12       Today   Subjective:   Jessica Donovan  patient feels better her mental status is improved      Blood pressure 140/68, pulse 78, temperature 97.5 F (36.4 C), temperature source Oral, resp. rate 18, height 5\' 3"  (1.6 m), weight 191 lb (86.6 kg), SpO2 95 %.  .  Intake/Output Summary (Last 24 hours) at 05/23/17 1249 Last data filed at 05/23/17 0800  Gross per 24 hour  Intake              890 ml  Output                0 ml  Net              890 ml    Exam VITAL SIGNS: Blood pressure 140/68, pulse 78, temperature 97.5 F (36.4 C), temperature source Oral, resp. rate 18, height 5\' 3"  (1.6 m), weight 191 lb (86.6 kg), SpO2 95 %.  GENERAL:  71 y.o.-year-old patient lying in the bed with no acute distress.  EYES: Pupils equal, round, reactive to light and accommodation. No scleral icterus. Extraocular muscles intact.  HEENT: Head atraumatic, normocephalic. Oropharynx and nasopharynx clear.  NECK:  Supple, no jugular venous distention. No thyroid enlargement, no tenderness.   LUNGS: Normal breath sounds bilaterally, no wheezing, rales,rhonchi or crepitation. No use of accessory muscles of respiration.  CARDIOVASCULAR: S1, S2 normal. No murmurs, rubs, or gallops.  ABDOMEN: Soft, nontender, nondistended. Bowel sounds present. No organomegaly or mass.  EXTREMITIES: No pedal edema, cyanosis, or clubbing.  NEUROLOGIC: Cranial nerves II through XII are intact. Muscle strength 5/5 in all extremities. Sensation intact. Gait not checked.  PSYCHIATRIC: The patient is alert and oriented x 3.  SKIN: No obvious rash, lesion, or ulcer.   Data Review     CBC w Diff: Lab Results  Component Value Date   WBC 7.8 05/22/2017   HGB 13.4 05/22/2017   HGB 11.8 (L) 12/15/2014   HCT 39.2 05/22/2017   HCT 37.0 12/15/2014   PLT 198 05/22/2017   PLT 269 12/15/2014   LYMPHOPCT 10 05/19/2017   MONOPCT 8 05/19/2017   EOSPCT 0 05/19/2017   BASOPCT 0 05/19/2017   CMP: Lab Results  Component Value Date   NA 142 05/22/2017   NA 142 12/15/2014   K 3.8 05/22/2017   K 4.0 12/15/2014   CL 108 05/22/2017   CL 109 (H) 12/15/2014   CO2 28 05/22/2017   CO2 26 12/15/2014   BUN 10 05/22/2017   BUN 19 (H) 12/15/2014   CREATININE 0.53 05/22/2017   CREATININE 0.67 12/15/2014   PROT 6.6 05/19/2017   ALBUMIN 3.9 05/19/2017   BILITOT 0.5 05/19/2017   ALKPHOS 87 05/19/2017   AST 163 (H) 05/19/2017   ALT 45 05/19/2017  .  Micro Results Recent Results (from  the past 240 hour(s))  Culture, blood (Routine X 2) w Reflex to ID Panel     Status: None (Preliminary result)   Collection Time: 05/19/17  9:53 PM  Result Value Ref Range Status   Specimen Description BLOOD BLOOD LEFT ARM  Final   Special Requests   Final    BOTTLES DRAWN AEROBIC AND ANAEROBIC Blood Culture adequate volume   Culture NO GROWTH 4 DAYS  Final   Report Status PENDING  Incomplete  Culture, blood (Routine X 2) w Reflex to ID Panel     Status: None (Preliminary result)   Collection Time: 05/19/17 10:30 PM  Result  Value Ref Range Status   Specimen Description BLOOD RIGHT HAND  Final   Special Requests BOTTLES DRAWN AEROBIC AND ANAEROBIC BCAV  Final   Culture NO GROWTH 4 DAYS  Final   Report Status PENDING  Incomplete  MRSA PCR Screening     Status: None   Collection Time: 05/19/17 11:00 PM  Result Value Ref Range Status   MRSA by PCR NEGATIVE NEGATIVE Final    Comment:        The GeneXpert MRSA Assay (FDA approved for NASAL specimens only), is one component of a comprehensive MRSA colonization surveillance program. It is not intended to diagnose MRSA infection nor to guide or monitor treatment for MRSA infections.   Gastrointestinal Panel by PCR , Stool     Status: None   Collection Time: 05/22/17  9:30 AM  Result Value Ref Range Status   Campylobacter species NOT DETECTED NOT DETECTED Final   Plesimonas shigelloides NOT DETECTED NOT DETECTED Final   Salmonella species NOT DETECTED NOT DETECTED Final   Yersinia enterocolitica NOT DETECTED NOT DETECTED Final   Vibrio species NOT DETECTED NOT DETECTED Final   Vibrio cholerae NOT DETECTED NOT DETECTED Final   Enteroaggregative E coli (EAEC) NOT DETECTED NOT DETECTED Final   Enteropathogenic E coli (EPEC) NOT DETECTED NOT DETECTED Final   Enterotoxigenic E coli (ETEC) NOT DETECTED NOT DETECTED Final   Shiga like toxin producing E coli (STEC) NOT DETECTED NOT DETECTED Final   Shigella/Enteroinvasive E coli (EIEC) NOT DETECTED NOT DETECTED Final   Cryptosporidium NOT DETECTED NOT DETECTED Final   Cyclospora cayetanensis NOT DETECTED NOT DETECTED Final   Entamoeba histolytica NOT DETECTED NOT DETECTED Final   Giardia lamblia NOT DETECTED NOT DETECTED Final   Adenovirus F40/41 NOT DETECTED NOT DETECTED Final   Astrovirus NOT DETECTED NOT DETECTED Final   Norovirus GI/GII NOT DETECTED NOT DETECTED Final   Rotavirus A NOT DETECTED NOT DETECTED Final   Sapovirus (I, II, IV, and V) NOT DETECTED NOT DETECTED Final  C difficile quick scan w PCR  reflex     Status: None   Collection Time: 05/22/17  9:30 AM  Result Value Ref Range Status   C Diff antigen NEGATIVE NEGATIVE Final   C Diff toxin NEGATIVE NEGATIVE Final   C Diff interpretation No C. difficile detected.  Final        Code Status Orders        Start     Ordered   05/19/17 2145  Do not attempt resuscitation (DNR)  Continuous    Question Answer Comment  In the event of cardiac or respiratory ARREST Do not call a "code blue"   In the event of cardiac or respiratory ARREST Do not perform Intubation, CPR, defibrillation or ACLS   In the event of cardiac or respiratory ARREST Use medication by any route, position,  wound care, and other measures to relive pain and suffering. May use oxygen, suction and manual treatment of airway obstruction as needed for comfort.      05/19/17 2144    Code Status History    Date Active Date Inactive Code Status Order ID Comments User Context   02/23/2017  4:40 PM 03/01/2017  7:02 PM DNR [id number]  Altamese Dilling, MD Inpatient   01/17/2017  4:07 AM 01/19/2017  8:10 PM DNR [id number]  Arnaldo Natal, MD Inpatient   04/09/2016  3:11 AM 04/10/2016  8:44 PM DNR [id number]  Arnaldo Natal, MD Inpatient   04/09/2016  2:05 AM 04/09/2016  3:11 AM Full Code [id number]  Oralia Manis, MD Inpatient   04/03/2016  6:53 PM 04/04/2016  6:17 PM Full Code [id number]  Houston Siren, MD Inpatient   02/23/2016 10:52 PM 02/25/2016  4:37 PM DNR [id number]  Robley Fries, MD Inpatient   01/24/2016  3:54 PM 01/27/2016  7:06 PM DNR [id number]  Milagros Loll, MD Inpatient   01/23/2016  4:07 PM 01/23/2016  4:22 PM Full Code [id number]  Shaune Pollack, MD Inpatient   12/07/2015 10:23 PM 12/09/2015  6:58 PM Full Code [id number]  Auburn Bilberry, MD Inpatient   11/29/2015  5:41 PM 12/01/2015  6:33 PM DNR [id number]  Shaune Pollack, MD Inpatient   11/10/2015  9:54 AM 11/12/2015  9:39 PM DNR [id number]  Linus Galas, MD Inpatient   11/07/2015  5:32 PM 11/10/2015  9:54 AM DNR [id number]   Altamese Dilling, MD Inpatient   11/07/2015  3:49 PM 11/07/2015  5:32 PM Full Code [id number]  Altamese Dilling, MD Inpatient    Advance Directive Documentation     Most Recent Value  Type of Advance Directive  Out of facility DNR (pink MOST or yellow form)  Pre-existing out of facility DNR order (yellow form or pink MOST form)  Yellow form placed in chart (order not valid for inpatient use)  "MOST" Form in Place?  -          Follow-up Information    Garlon Hatchet, MD Follow up in 1 week(s).   Specialty:  Family Medicine       Wyline Mood, MD Follow up in 2 week(s).   Specialty:  Surgery Why:  eval for colonoscopy Contact information: [address] [place] [address] [phone]           Discharge Medications   Allergies as of 05/23/2017      Reactions   Codeine Nausea And Vomiting   Influenza Vaccines Other (See Comments)   Reaction:  Caused pt to pass out    Methadone Hives, Itching   Percocet [oxycodone-acetaminophen] Nausea And Vomiting   Tetanus Toxoids Swelling, Other (See Comments)   Reaction:  Swelling at injection site   Tetracyclines & Related Rash      Medication List    STOP taking these medications   nitrofurantoin (macrocrystal-monohydrate) 100 MG capsule Commonly known as:  MACROBID     TAKE these medications   acetaminophen 500 MG tablet Commonly known as:  TYLENOL Take 1,000 mg by mouth every 4 (four) hours as needed for mild pain or headache.   ALPRAZolam 1 MG tablet Commonly known as:  XANAX Take 1 mg by mouth 3 (three) times daily.   aspirin EC 81 MG tablet Take 81 mg by mouth daily.   atorvastatin 20 MG tablet Commonly known as:  LIPITOR Take 20 mg by mouth at bedtime.   Biotin 5 MG  Caps Take 5 mg by mouth daily.   celecoxib 200 MG capsule Commonly known as:  CELEBREX Take 200 mg by mouth daily.   ciprofloxacin 500 MG tablet Commonly known as:  CIPRO Take 1 tablet (500 mg total) by  mouth 2 (two) times daily.   cyclobenzaprine 10 MG tablet Commonly known as:  FLEXERIL Take 1 tablet (10 mg total) by mouth 3 (three) times daily as needed for muscle spasms.   diphenoxylate-atropine 2.5-0.025 MG tablet Commonly known as:  LOMOTIL Take 1 tablet by mouth every 4 (four) hours as needed for diarrhea or loose stools.   ferrous sulfate 325 (65 FE) MG tablet Take 325 mg by mouth daily with breakfast.   fesoterodine 8 MG Tb24 tablet Commonly known as:  TOVIAZ Take 8 mg by mouth daily. At bedtime   furosemide 20 MG tablet Commonly known as:  LASIX Take 1 tablet (20 mg total) by mouth daily as needed.   insulin aspart 100 UNIT/ML injection Commonly known as:  novoLOG Inject 3-15 Units into the skin 3 (three) times daily with meals as needed for high blood sugar. Pt uses as needed per sliding scale:    Less than 140:  0 units  140-180:  3 units 181-220:  4 units 221- 260:  6 units 261- 320:  8 units 321-360:  10 units 361-400:  12 units Greater than 400:  15 units   Insulin Glargine 300 UNIT/ML Sopn Commonly known as:  TOUJEO SOLOSTAR Inject 30 Units into the skin daily.   isosorbide mononitrate 30 MG 24 hr tablet Commonly known as:  IMDUR Take 30 mg by mouth daily.   liothyronine 25 MCG tablet Commonly known as:  CYTOMEL Take 25 mcg by mouth daily.   lisinopril 20 MG tablet Commonly known as:  PRINIVIL,ZESTRIL Take 20 mg by mouth daily.   metoprolol tartrate 25 MG tablet Commonly known as:  LOPRESSOR Take 25 mg by mouth 2 (two) times daily.   metroNIDAZOLE 500 MG tablet Commonly known as:  FLAGYL Take 1 tablet (500 mg total) by mouth 3 (three) times daily.   mirtazapine 15 MG tablet Commonly known as:  REMERON Take 15 mg by mouth at bedtime.   morphine 15 MG 12 hr tablet Commonly known as:  MS CONTIN Take 1 tablet (15 mg total) by mouth every 12 (twelve) hours. What changed:  when to take this   morphine 15 MG tablet Commonly known as:  MSIR Take 1  tablet (15 mg total) by mouth 3 (three) times daily as needed for severe pain. What changed:  Another medication with the same name was changed. Make sure you understand how and when to take each.   multivitamin with minerals Tabs tablet Take 1 tablet by mouth daily.   nitroGLYCERIN 0.4 MG SL tablet Commonly known as:  NITROSTAT Place 0.4 mg under the tongue every 5 (five) minutes as needed for chest pain. Reported on 04/16/2016   omeprazole 20 MG capsule Commonly known as:  PRILOSEC Take 20 mg by mouth daily.   pregabalin 150 MG capsule Commonly known as:  LYRICA Take 1 capsule (150 mg total) by mouth 2 (two) times daily. What changed:  when to take this   promethazine 25 MG tablet Commonly known as:  PHENERGAN Take 25 mg by mouth every 6 (six) hours as needed for nausea.   traZODone 100 MG tablet Commonly known as:  DESYREL Take 100 mg by mouth at bedtime.   venlafaxine 75 MG tablet Commonly known  as:  EFFEXOR Take 225 mg by mouth daily.   vitamin B-12 1000 MCG tablet Commonly known as:  CYANOCOBALAMIN Take 1,000 mcg by mouth daily.   Vitamin D (Ergocalciferol) 50000 units Caps capsule Commonly known as:  DRISDOL Take 50,000 Units by mouth every Sunday. Pt takes on Sunday.          Total Time in preparing paper work, data evaluation and todays exam - 35 minutes  Auburn Bilberry M.D on 05/23/2017 at 12:49 PM  [facility]   Office  [phone]

## 2017-05-23 NOTE — Care Management Note (Signed)
Case Management Note  Patient Details  Name: Jessica Donovan MRN: [id number] Date of Birth: 15-Feb-1946  Subjective/Objective:       Discussed discharge planning with [name] who continues to refuse offer of home health PT.  Has a RW and a rolliator at home. No other needs identified.             Action/Plan:   Expected Discharge Date:  05/23/17               Expected Discharge Plan:     In-House Referral:     Discharge planning Services  CM Consult  Post Acute Care Choice:  Home Health Choice offered to:  Patient  DME Arranged:    DME Agency:     HH Arranged:  Patient Refused HH HH Agency:     Status of Service:  Completed, signed off  If discussed at [employer] Length of Stay Meetings, dates discussed:    Additional Comments:  Karman Veney A, RN 05/23/2017, 10:41 AM

## 2017-05-24 ENCOUNTER — Other Ambulatory Visit: Payer: Self-pay | Admitting: *Deleted

## 2017-05-24 LAB — CULTURE, BLOOD (ROUTINE X 2)
CULTURE: NO GROWTH
Culture: NO GROWTH
Special Requests: ADEQUATE

## 2017-05-24 NOTE — Patient Outreach (Signed)
Attempt made to contact pt, follow up for transition of care- recent hospitalization 5/30-6/3 for frequent falls, acute cystitis, acute respiratory failure with hypoxia, acute colitis.  HIPAA compliant voice message left with contact name and number.     Plan:  If no response, plan to follow up again tomorrow for transition of care.    [name] M.   Pierzchala RN CCM [facility]  [phone]

## 2017-05-25 ENCOUNTER — Other Ambulatory Visit: Payer: Self-pay | Admitting: *Deleted

## 2017-05-25 NOTE — Patient Outreach (Signed)
Second attempt made to contact pt, follow up for transition of care- recent hospitalization 5/30-6/3 for frequent falls, acute cystitis, acute respiratory failure with hypoxia, acute colitis.   HIPAA compliant voice message left with contact name and number.    Plan:  If no response, plan to follow up again tomorrow.     [name] M.   Pierzchala RN CCM [facility]  [phone]

## 2017-05-26 ENCOUNTER — Other Ambulatory Visit: Payer: Self-pay | Admitting: *Deleted

## 2017-05-26 ENCOUNTER — Encounter: Payer: Self-pay | Admitting: *Deleted

## 2017-05-26 ENCOUNTER — Ambulatory Visit: Payer: Self-pay | Admitting: *Deleted

## 2017-05-26 NOTE — Patient Outreach (Signed)
Received a return phone call from pt to voice message left yesterday.   Spoke with pt, HIPAA/identity verified, discussed purpose of phone call attempt- follow up on recent hospitalization 5/30-6/3 for Acute cystitis, acute respiratory failure with hypoxia, colitis, frequent falls.  Pt reports been dealing with diarrhea, on medication for it - helping/better today.   Pt reports reason for hospitalization was UTI/told by MD worse he has seen plus was informed has a hole in her colon that was causing pain and mass in abdomen.  Pt reports has a consultation with GI MD 7/6, called office for earlier appointment, put on a waiting list.  Pt reports was given 2 antibiotics to take (Cipro 500 mg bid, Metronidazole 500 mg tid) plus also taking Macrobid.   Pt reports taking all medications per discharge papers but did not feel like going over them right now.  RN CM discussed with pt per discharge papers- was to stop Macrobid to which pt reports informed hospital MD has been taking this medication for a long time/was told need to take for the rest of her life to keep from getting UTI constantly  so continues to take it. Pt reports she did call Dr. Emilia Beck[place]Conville office yesterday, gave update to nurse (cost [phone] in transportation/[place] to go to MD office), MD to call her back.   RN CM reviewed with pt [facility] transition of care program - weekly calls, a home visit.    Plan:  As discussed with pt,  plan to follow up again next week telephonically- talk about scheduling home visit then.             Barrier letter faxed to Dr. [facility]Con[facility] involvement.   [name] M.   Drue Harr RN CCM [facility] Care Management  [phone]

## 2017-05-27 LAB — GLUCOSE, CAPILLARY: GLUCOSE-CAPILLARY: 123 mg/dL — AB (ref 65–99)

## 2017-06-04 ENCOUNTER — Other Ambulatory Visit: Payer: Self-pay | Admitting: *Deleted

## 2017-06-04 ENCOUNTER — Ambulatory Visit: Payer: Self-pay | Admitting: *Deleted

## 2017-06-04 NOTE — Patient Outreach (Addendum)
Attempt made to contact pt for transition of care, ongoing follow up on recent hospitalization 5/30-6/3 for acute cystitis,acute respiratory failure with hypoxia, colitis, frequent falls.   [facility] 6/8 pt  called Nurse call center 6/7, view in Epic media pt complained of chest pain on and off, advised to go to ED.  [place] in [place] did not follow up at ED. Pt's history includes but not limited to- Hypotension,DM 2, Dementia, CAD, chronic narcotic use.  HIPAA compliant voice message left with contact name and number.    Plan:  If no response, plan to follow up again later today.    [name] M.   Pierzchala RN CCM [facility] Care [facility]  [phone]

## 2017-06-04 NOTE — Patient Outreach (Signed)
Another attempt made to today as part of transition of care, ongoing follow up on recent hospitalization 5/30-6/3 for acute cystitis,acute respiratory failure with hypoxia, colitis, frequent falls.  HIPAA compliant voice message left with contact name and number.    Plan:  If no response to voice message, plan to follow up again next week telephonically.    [name] M.   Santiaga Butzin RN CCM [facility] Care [facility]  [phone]

## 2017-06-08 ENCOUNTER — Telehealth: Payer: Self-pay

## 2017-06-08 NOTE — Telephone Encounter (Signed)
Pt had two stens place in by Dr Lurlean LeydenAtieh at [facility]//Has seen since Dr Cassie FreerParachos in past/ thinks the stents may have come out/was afraid to tell anyone/was in hospital here at [facility]/ she is still having CP and is afraid  to tell anyone   Please advise

## 2017-06-08 NOTE — Telephone Encounter (Signed)
Received incoming call from patient. She is previous patient of Dr Darrold JunkerParaschos who last saw her September 2017. Patient experiencing on and off chest pain with associated SOB, occasional left arm pain, nausea and dizziness. Patient states she called her [employer] nurse through her insurance company about 2 weeks ago and she advised her to go to emergency room. Patient has not gone to ER yet. She is afraid her stents have come out that she had placed in 2014 at [facility] [place] by Dr Lurlean LeydenAtieh, patient thinks. Patient has never established care with us and advised she could call Dr Darrold JunkerParaschos office for advice as well.  Advised patient to call 911 to go to the ER. She was very reluctant to do so and asked proceeded to tell me about recent hospitalization [facility]. Patient still wanted to see a doctor here today if possible. Again, advised patient that going to the ER was the best thing for her to do in order to receive emergency care in treatment of active chest pain. She verbalized understanding.

## 2017-06-09 ENCOUNTER — Emergency Department: Payer: PPO

## 2017-06-09 ENCOUNTER — Encounter: Payer: Self-pay | Admitting: Emergency Medicine

## 2017-06-09 ENCOUNTER — Observation Stay
Admission: EM | Admit: 2017-06-09 | Discharge: 2017-06-10 | Disposition: A | Discharge status: Home / Self Care | Payer: PPO | Attending: Internal Medicine | Admitting: Internal Medicine

## 2017-06-09 DIAGNOSIS — R0789 Other chest pain: Principal | ICD-10-CM | POA: Insufficient documentation

## 2017-06-09 DIAGNOSIS — E785 Hyperlipidemia, unspecified: Secondary | ICD-10-CM | POA: Diagnosis not present

## 2017-06-09 DIAGNOSIS — R079 Chest pain, unspecified: Secondary | ICD-10-CM

## 2017-06-09 DIAGNOSIS — Z955 Presence of coronary angioplasty implant and graft: Secondary | ICD-10-CM | POA: Insufficient documentation

## 2017-06-09 DIAGNOSIS — I10 Essential (primary) hypertension: Secondary | ICD-10-CM | POA: Diagnosis not present

## 2017-06-09 DIAGNOSIS — Z7982 Long term (current) use of aspirin: Secondary | ICD-10-CM | POA: Insufficient documentation

## 2017-06-09 DIAGNOSIS — R296 Repeated falls: Secondary | ICD-10-CM

## 2017-06-09 DIAGNOSIS — M545 Low back pain: Secondary | ICD-10-CM | POA: Diagnosis not present

## 2017-06-09 DIAGNOSIS — M869 Osteomyelitis, unspecified: Secondary | ICD-10-CM | POA: Diagnosis not present

## 2017-06-09 DIAGNOSIS — F419 Anxiety disorder, unspecified: Secondary | ICD-10-CM | POA: Insufficient documentation

## 2017-06-09 DIAGNOSIS — E1169 Type 2 diabetes mellitus with other specified complication: Secondary | ICD-10-CM | POA: Insufficient documentation

## 2017-06-09 DIAGNOSIS — Z8249 Family history of ischemic heart disease and other diseases of the circulatory system: Secondary | ICD-10-CM | POA: Insufficient documentation

## 2017-06-09 DIAGNOSIS — Z887 Allergy status to serum and vaccine status: Secondary | ICD-10-CM | POA: Diagnosis not present

## 2017-06-09 DIAGNOSIS — I252 Old myocardial infarction: Secondary | ICD-10-CM | POA: Diagnosis not present

## 2017-06-09 DIAGNOSIS — Z89422 Acquired absence of other left toe(s): Secondary | ICD-10-CM | POA: Diagnosis not present

## 2017-06-09 DIAGNOSIS — Z79891 Long term (current) use of opiate analgesic: Secondary | ICD-10-CM | POA: Insufficient documentation

## 2017-06-09 DIAGNOSIS — F0390 Unspecified dementia without behavioral disturbance: Secondary | ICD-10-CM | POA: Insufficient documentation

## 2017-06-09 DIAGNOSIS — E119 Type 2 diabetes mellitus without complications: Secondary | ICD-10-CM | POA: Diagnosis not present

## 2017-06-09 DIAGNOSIS — Z8673 Personal history of transient ischemic attack (TIA), and cerebral infarction without residual deficits: Secondary | ICD-10-CM | POA: Diagnosis not present

## 2017-06-09 DIAGNOSIS — Z794 Long term (current) use of insulin: Secondary | ICD-10-CM | POA: Diagnosis not present

## 2017-06-09 DIAGNOSIS — I2510 Atherosclerotic heart disease of native coronary artery without angina pectoris: Secondary | ICD-10-CM | POA: Insufficient documentation

## 2017-06-09 DIAGNOSIS — Z79899 Other long term (current) drug therapy: Secondary | ICD-10-CM | POA: Diagnosis not present

## 2017-06-09 DIAGNOSIS — G8929 Other chronic pain: Secondary | ICD-10-CM | POA: Diagnosis not present

## 2017-06-09 DIAGNOSIS — Z885 Allergy status to narcotic agent status: Secondary | ICD-10-CM | POA: Insufficient documentation

## 2017-06-09 DIAGNOSIS — F329 Major depressive disorder, single episode, unspecified: Secondary | ICD-10-CM | POA: Diagnosis not present

## 2017-06-09 DIAGNOSIS — S0990XA Unspecified injury of head, initial encounter: Secondary | ICD-10-CM | POA: Diagnosis not present

## 2017-06-09 HISTORY — DX: Anxiety disorder, unspecified: F41.9

## 2017-06-09 LAB — GLUCOSE, CAPILLARY
GLUCOSE-CAPILLARY: 139 mg/dL — AB (ref 65–99)
Glucose-Capillary: 175 mg/dL — ABNORMAL HIGH (ref 65–99)

## 2017-06-09 LAB — CBC
HCT: 39.4 % (ref 35.0–47.0)
HEMOGLOBIN: 13.3 g/dL (ref 12.0–16.0)
MCH: 32.6 pg (ref 26.0–34.0)
MCHC: 33.7 g/dL (ref 32.0–36.0)
MCV: 96.9 fL (ref 80.0–100.0)
Platelets: 241 10*3/uL (ref 150–440)
RBC: 4.06 MIL/uL (ref 3.80–5.20)
RDW: 14.2 % (ref 11.5–14.5)
WBC: 5.8 10*3/uL (ref 3.6–11.0)

## 2017-06-09 LAB — BASIC METABOLIC PANEL
ANION GAP: 6 (ref 5–15)
BUN: 19 mg/dL (ref 6–20)
CALCIUM: 9.4 mg/dL (ref 8.9–10.3)
CO2: 27 mmol/L (ref 22–32)
Chloride: 101 mmol/L (ref 101–111)
Creatinine, Ser: 0.51 mg/dL (ref 0.44–1.00)
Glucose, Bld: 201 mg/dL — ABNORMAL HIGH (ref 65–99)
Potassium: 4.2 mmol/L (ref 3.5–5.1)
SODIUM: 134 mmol/L — AB (ref 135–145)

## 2017-06-09 LAB — LIPID PANEL
CHOL/HDL RATIO: 3.4 ratio
Cholesterol: 158 mg/dL (ref 0–200)
HDL: 46 mg/dL (ref >40)
LDL CALC: 64 mg/dL (ref 0–99)
TRIGLYCERIDES: 242 mg/dL — AB (ref <150)
VLDL: 48 mg/dL — ABNORMAL HIGH (ref 0–40)

## 2017-06-09 LAB — MRSA PCR SCREENING: MRSA BY PCR: NEGATIVE

## 2017-06-09 LAB — TROPONIN I: Troponin I: <0.03 ng/mL (ref <0.03)

## 2017-06-09 LAB — TSH: TSH: 1.894 u[IU]/mL (ref 0.350–4.500)

## 2017-06-09 MED ORDER — INSULIN ASPART 100 UNIT/ML ~~LOC~~ SOLN: 0.0000 [IU] | Freq: Every day | SUBCUTANEOUS | Status: DC

## 2017-06-09 MED ORDER — TRAZODONE HCL 100 MG PO TABS
100.0000 mg | ORAL_TABLET | Freq: Every day | ORAL | Status: DC
  Administered 2017-06-09: 100 mg via ORAL
  Filled 2017-06-09: qty 1

## 2017-06-09 MED ORDER — PANTOPRAZOLE SODIUM 40 MG PO TBEC
40.0000 mg | DELAYED_RELEASE_TABLET | Freq: Every day | ORAL | Status: DC
  Administered 2017-06-10: 40 mg via ORAL
  Filled 2017-06-09: qty 1

## 2017-06-09 MED ORDER — ALPRAZOLAM 1 MG PO TABS
1.0000 mg | ORAL_TABLET | Freq: Three times a day (TID) | ORAL | Status: DC
  Administered 2017-06-09 – 2017-06-10 (×2): 1 mg via ORAL
  Filled 2017-06-09 (×2): qty 1

## 2017-06-09 MED ORDER — MORPHINE SULFATE ER 15 MG PO TBCR
15.0000 mg | EXTENDED_RELEASE_TABLET | Freq: Two times a day (BID) | ORAL | Status: DC
  Administered 2017-06-09 – 2017-06-10 (×2): 15 mg via ORAL
  Filled 2017-06-09 (×2): qty 1

## 2017-06-09 MED ORDER — ISOSORBIDE MONONITRATE ER 30 MG PO TB24
30.0000 mg | ORAL_TABLET | Freq: Every day | ORAL | Status: DC
  Administered 2017-06-10: 30 mg via ORAL
  Filled 2017-06-09: qty 1

## 2017-06-09 MED ORDER — ADULT MULTIVITAMIN W/MINERALS CH
1.0000 | ORAL_TABLET | Freq: Every day | ORAL | Status: DC
  Administered 2017-06-10: 1 via ORAL
  Filled 2017-06-09: qty 1

## 2017-06-09 MED ORDER — INSULIN GLARGINE 100 UNIT/ML ~~LOC~~ SOLN
30.0000 [IU] | Freq: Every day | SUBCUTANEOUS | Status: DC
  Administered 2017-06-10: 30 [IU] via SUBCUTANEOUS
  Filled 2017-06-09 (×2): qty 0.3

## 2017-06-09 MED ORDER — LIOTHYRONINE SODIUM 25 MCG PO TABS
25.0000 ug | ORAL_TABLET | Freq: Every day | ORAL | Status: DC
  Administered 2017-06-10: 25 ug via ORAL
  Filled 2017-06-09: qty 1

## 2017-06-09 MED ORDER — ACETAMINOPHEN 500 MG PO TABS
1000.0000 mg | ORAL_TABLET | Freq: Four times a day (QID) | ORAL | Status: DC | PRN
  Administered 2017-06-10: 1000 mg via ORAL
  Filled 2017-06-09: qty 2

## 2017-06-09 MED ORDER — CYCLOBENZAPRINE HCL 10 MG PO TABS: 10.0000 mg | ORAL_TABLET | Freq: Three times a day (TID) | ORAL | Status: DC | PRN

## 2017-06-09 MED ORDER — LISINOPRIL 20 MG PO TABS: 20.0000 mg | ORAL_TABLET | Freq: Every day | ORAL | Status: DC

## 2017-06-09 MED ORDER — VITAMIN B-12 1000 MCG PO TABS
1000.0000 ug | ORAL_TABLET | Freq: Every day | ORAL | Status: DC
  Administered 2017-06-10: 1000 ug via ORAL
  Filled 2017-06-09: qty 1

## 2017-06-09 MED ORDER — VITAMIN D (ERGOCALCIFEROL) 1.25 MG (50000 UNIT) PO CAPS: 50000.0000 [IU] | ORAL_CAPSULE | ORAL | Status: DC

## 2017-06-09 MED ORDER — CELECOXIB 200 MG PO CAPS
200.0000 mg | ORAL_CAPSULE | Freq: Every day | ORAL | Status: DC
  Administered 2017-06-10: 200 mg via ORAL
  Filled 2017-06-09: qty 1

## 2017-06-09 MED ORDER — FERROUS SULFATE 325 (65 FE) MG PO TABS
325.0000 mg | ORAL_TABLET | Freq: Every day | ORAL | Status: DC
  Administered 2017-06-10: 325 mg via ORAL
  Filled 2017-06-09: qty 1

## 2017-06-09 MED ORDER — ASPIRIN 81 MG PO CHEW
162.0000 mg | CHEWABLE_TABLET | Freq: Once | ORAL | Status: AC
  Administered 2017-06-09: 162 mg via ORAL
  Filled 2017-06-09: qty 2

## 2017-06-09 MED ORDER — ATORVASTATIN CALCIUM 20 MG PO TABS
20.0000 mg | ORAL_TABLET | Freq: Every day | ORAL | Status: DC
  Administered 2017-06-09: 20 mg via ORAL
  Filled 2017-06-09: qty 1

## 2017-06-09 MED ORDER — ONDANSETRON HCL 4 MG/2ML IJ SOLN: 4.0000 mg | Freq: Four times a day (QID) | INTRAMUSCULAR | Status: DC | PRN

## 2017-06-09 MED ORDER — ONDANSETRON HCL 4 MG PO TABS: 4.0000 mg | ORAL_TABLET | Freq: Four times a day (QID) | ORAL | Status: DC | PRN

## 2017-06-09 MED ORDER — VENLAFAXINE HCL ER 75 MG PO CP24
225.0000 mg | ORAL_CAPSULE | Freq: Every day | ORAL | Status: DC
  Administered 2017-06-10: 225 mg via ORAL
  Filled 2017-06-09: qty 3

## 2017-06-09 MED ORDER — METOPROLOL TARTRATE 25 MG PO TABS
25.0000 mg | ORAL_TABLET | Freq: Two times a day (BID) | ORAL | Status: DC
  Administered 2017-06-09: 25 mg via ORAL
  Filled 2017-06-09: qty 1

## 2017-06-09 MED ORDER — NITROGLYCERIN 0.4 MG SL SUBL: 0.4000 mg | SUBLINGUAL_TABLET | SUBLINGUAL | Status: DC | PRN

## 2017-06-09 MED ORDER — FESOTERODINE FUMARATE ER 8 MG PO TB24
8.0000 mg | ORAL_TABLET | Freq: Every day | ORAL | Status: DC
  Administered 2017-06-09: 8 mg via ORAL
  Filled 2017-06-09 (×2): qty 1

## 2017-06-09 MED ORDER — MORPHINE SULFATE 15 MG PO TABS
15.0000 mg | ORAL_TABLET | Freq: Three times a day (TID) | ORAL | Status: DC | PRN
  Administered 2017-06-10 (×2): 15 mg via ORAL
  Filled 2017-06-09 (×4): qty 1

## 2017-06-09 MED ORDER — PREGABALIN 50 MG PO CAPS
150.0000 mg | ORAL_CAPSULE | Freq: Two times a day (BID) | ORAL | Status: DC
  Administered 2017-06-09 – 2017-06-10 (×2): 150 mg via ORAL
  Filled 2017-06-09 (×2): qty 3

## 2017-06-09 MED ORDER — MIRTAZAPINE 15 MG PO TABS
15.0000 mg | ORAL_TABLET | Freq: Every day | ORAL | Status: DC
  Administered 2017-06-09: 15 mg via ORAL
  Filled 2017-06-09: qty 1

## 2017-06-09 MED ORDER — INSULIN ASPART 100 UNIT/ML ~~LOC~~ SOLN: 0.0000 [IU] | Freq: Three times a day (TID) | SUBCUTANEOUS | Status: DC

## 2017-06-09 MED ORDER — ENOXAPARIN SODIUM 40 MG/0.4ML ~~LOC~~ SOLN
40.0000 mg | SUBCUTANEOUS | Status: DC
  Administered 2017-06-09: 40 mg via SUBCUTANEOUS
  Filled 2017-06-09: qty 0.4

## 2017-06-09 MED ORDER — ASPIRIN EC 81 MG PO TBEC
81.0000 mg | DELAYED_RELEASE_TABLET | Freq: Every day | ORAL | Status: DC
  Administered 2017-06-10: 81 mg via ORAL
  Filled 2017-06-09: qty 1

## 2017-06-09 MED ORDER — DIPHENOXYLATE-ATROPINE 2.5-0.025 MG PO TABS: 1.0000 | ORAL_TABLET | ORAL | Status: DC | PRN

## 2017-06-09 NOTE — ED Notes (Signed)
Pt given ginger ale, ok per Admitting MD

## 2017-06-09 NOTE — ED Notes (Signed)
Pt given crackers at this time, pt was resting in bed, was noted to be sleeping in bed, with her phone held up to her ear. Pt awoke when this RN entered room. Pt requested crackers. Pt denies any further needs. Will continue to monitor for further patient needs at this time.

## 2017-06-09 NOTE — ED Provider Notes (Signed)
[facility] Emergency Department Provider Note  ____________________________________________   First MD Initiated Contact with Patient 06/09/17 1349     (approximate)  I have reviewed the triage vital signs and the nursing notes.   HISTORY  Chief Complaint Chest Pain    HPI Jessica Donovan is a 71 y.o. female who self presents to the emergency Department with roughly 3 weeks of intermittent pressure-like left substernal chest pain radiating to her left jaw. She does report it is somewhat exertional somewhat improved with rest although she is having intermittent episodes of breath pain. She has a previous history of coronary artery disease and is been stented in the past. She is currently only taking aspirin and reports compliance with her medications. Her pain is not ripping or tearing and does not cut her back. It is moderate in severity. Worse with exertion and improved with rest. She's had no fevers or chills. She's never had a deep vein thrombus or pulmonary embolus in.   Past Medical History:  Diagnosis Date  . Anxiety   . Chronic back pain   . Collagen vascular disease (HCC)   . Coronary artery disease    a. s/p PCI/DES to dRCA & mLCx in 2014 with repeat LHC in 10/2015 showing patent stents  . Diabetes mellitus without complication (HCC)   . Hypertension   . Low back pain   . Myocardial infarction (HCC)   . Osteomyelitis of toe (HCC) 01/23/2016  . Stroke [facility])     Patient Active Problem List   Diagnosis Date Noted  . Anxiety 06/10/2017  . Chest pain with moderate risk for cardiac etiology 06/09/2017  . UTI (urinary tract infection) 05/19/2017  . CAD (coronary artery disease) 05/19/2017  . Chronic narcotic use 05/19/2017  . Cellulitis in diabetic foot (HCC) 05/19/2017  . Opioid overdose 02/23/2017  . Altered mental status 02/23/2017  . Acute colitis 01/19/2017  . Sepsis (HCC) 01/17/2017  . Cellulitis of fourth toe of right foot 04/10/2016    . Somnolence 04/09/2016  . Dementia 04/09/2016  . Cellulitis 04/03/2016  . Hematoma of right parietal scalp 02/23/2016  . Multiple falls 02/23/2016  . Physical debility 02/23/2016  . DM2 (diabetes mellitus, type 2) (HCC) 02/23/2016  . Syncope and collapse 02/23/2016  . Polypharmacy 02/23/2016  . Osteomyelitis of toe (HCC) 01/23/2016  . Rhabdomyolysis 12/07/2015  . ARF (acute renal failure) (HCC) 11/29/2015  . Hypotension 11/29/2015  . 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 Angina effort (HCC) 11/07/2015  . Osteomyelitis (HCC) 11/07/2015    Past Surgical History:  Procedure Laterality Date  . AMPUTATION TOE Left 11/10/2015   Procedure: AMPUTATION TOE;  Surgeon: Linus Galas, MD;  Location: [facility] ORS;  Service: Podiatry;  Laterality: Left;  . AMPUTATION TOE Left 01/24/2016   Procedure: AMPUTATION TOE (2nd mpj);  Surgeon: Linus Galas, DPM;  Location: [facility] ORS;  Service: Podiatry;  Laterality: Left;  . CARDIAC CATHETERIZATION N/A 11/12/2015   Procedure: Left Heart Cath and Coronary Angiography;  Surgeon: Marcina Millard, MD;  Location: [facility] INVASIVE CV LAB;  Service: Cardiovascular;  Laterality: N/A;  . JOINT REPLACEMENT    . TOE AMPUTATION      Prior to Admission medications   Medication Sig Start Date End Date Taking? Authorizing Provider  acetaminophen (TYLENOL) 500 MG tablet Take 1,000 mg by mouth every 4 (four) hours as needed for mild pain or headache.    Yes [provider]  ALPRAZolam Prudy Feeler) 1 MG tablet Take 1 mg by mouth 3 (  three) times daily.  02/08/17  Yes [provider]  aspirin EC 81 MG tablet Take 81 mg by mouth daily.   Yes [provider]  atorvastatin (LIPITOR) 20 MG tablet Take 20 mg by mouth at bedtime.    Yes [provider]  Biotin 5 MG CAPS Take 5 mg by mouth daily.    Yes [provider]  celecoxib (CELEBREX) 200 MG capsule Take 200 mg by mouth daily.    Yes [provider]  cyclobenzaprine (FLEXERIL)  10 MG tablet Take 1 tablet (10 mg total) by mouth 3 (three) times daily as needed for muscle spasms. 01/27/16  Yes Enid Baas, MD  diphenoxylate-atropine (LOMOTIL) 2.5-0.025 MG tablet Take 1 tablet by mouth every 4 (four) hours as needed for diarrhea or loose stools.    Yes [provider]  ferrous sulfate 325 (65 FE) MG tablet Take 325 mg by mouth daily with breakfast.   Yes [provider]  fesoterodine (TOVIAZ) 8 MG TB24 tablet Take 8 mg by mouth at bedtime.    Yes [provider]  furosemide (LASIX) 20 MG tablet Take 1 tablet (20 mg total) by mouth daily as needed. Patient taking differently: Take 20 mg by mouth daily as needed for fluid.  03/01/17  Yes [name], MD  insulin aspart (NOVOLOG) 100 UNIT/ML injection Inject 3-15 Units into the skin 3 (three) times daily with meals as needed for high blood sugar. Pt uses as needed per sliding scale:    Less than 140:  0 units  140-180:  3 units 181-220:  4 units 221- 260:  6 units 261- 320:  8 units 321-360:  10 units 361-400:  12 units Greater than 400:  15 units   Yes [provider]  Insulin Glargine (TOUJEO SOLOSTAR) 300 UNIT/ML SOPN Inject 30 Units into the skin daily. 03/01/17  Yes [name], MD  isosorbide mononitrate (IMDUR) 30 MG 24 hr tablet Take 30 mg by mouth daily.    Yes [provider]  liothyronine (CYTOMEL) 25 MCG tablet Take 25 mcg by mouth daily.   Yes [provider]  lisinopril (PRINIVIL,ZESTRIL) 20 MG tablet Take 20 mg by mouth daily.   Yes [provider]  metoprolol tartrate (LOPRESSOR) 25 MG tablet Take 25 mg by mouth 2 (two) times daily. 01/28/17  Yes [provider]  mirtazapine (REMERON) 15 MG tablet Take 15 mg by mouth at bedtime.   Yes [provider]  morphine (MS CONTIN) 15 MG 12 hr tablet Take 1 tablet (15 mg total) by mouth every 12 (twelve) hours. 05/23/17  Yes Auburn Bilberry, MD  morphine (MSIR) 15 MG tablet Take 1 tablet  (15 mg total) by mouth 3 (three) times daily as needed for severe pain. 05/23/17  Yes Auburn Bilberry, MD  Multiple Vitamin (MULTIVITAMIN WITH MINERALS) TABS tablet Take 1 tablet by mouth daily.   Yes [provider]  nitroGLYCERIN (NITROSTAT) 0.4 MG SL tablet Place 0.4 mg under the tongue every 5 (five) minutes as needed for chest pain. Reported on 04/16/2016   Yes [provider]  omeprazole (PRILOSEC) 20 MG capsule Take 20 mg by mouth daily.   Yes [provider]  pregabalin (LYRICA) 150 MG capsule Take 1 capsule (150 mg total) by mouth 2 (two) times daily. Patient taking differently: Take 150 mg by mouth 3 (three) times daily.  01/19/17  Yes Sudini, Wardell Heath, MD  promethazine (PHENERGAN) 25 MG tablet Take 25 mg by mouth every 6 (  six) hours as needed for nausea.    Yes [provider]  traZODone (DESYREL) 100 MG tablet Take 100 mg by mouth at bedtime. 01/28/17  Yes [provider]  venlafaxine XR (EFFEXOR-XR) 75 MG 24 hr capsule Take 225 mg by mouth daily.   Yes [provider]  vitamin B-12 (CYANOCOBALAMIN) 1000 MCG tablet Take 1,000 mcg by mouth daily.   Yes [provider]  Vitamin D, Ergocalciferol, (DRISDOL) 50000 units CAPS capsule Take 50,000 Units by mouth every Sunday.    Yes [provider]    Allergies Codeine; Influenza vaccines; Methadone; Percocet [oxycodone-acetaminophen]; Tetanus toxoids; and Tetracyclines & related  Family History  Problem Relation Age of Onset  . CAD Mother   . CAD Father     Social History Social History  Substance Use Topics  . Smoking status: Never Smoker  . Smokeless tobacco: Never Used  . Alcohol use No    Review of Systems Constitutional: No fever/chills Eyes: No visual changes. ENT: No sore throat. Cardiovascular: Positive chest pain. Respiratory: Positive shortness of breath. Gastrointestinal: No abdominal pain.  No nausea, no vomiting.  No diarrhea.  No  constipation. Genitourinary: Negative for dysuria. Musculoskeletal: Negative for back pain. Skin: Negative for rash. Neurological: Negative for headaches, focal weakness or numbness.   ____________________________________________   PHYSICAL EXAM:  VITAL SIGNS: ED Triage Vitals  Enc Vitals Group     BP 06/09/17 1221 (!) 106/44     Pulse Rate 06/09/17 1221 79     Resp 06/09/17 1221 18     Temp 06/09/17 1221 98.2 F (36.8 C)     Temp Source 06/09/17 1221 Oral     SpO2 06/09/17 1221 98 %     Weight 06/09/17 1218 191 lb (86.6 kg)     Height 06/09/17 1218 5\' 3"  (1.6 m)     Head Circumference --      Peak Flow --      Pain Score 06/09/17 1218 7     Pain Loc --      Pain Edu? --      Excl. in GC? --     Constitutional: Alert and oriented 4 somewhat anxious appearing nontoxic no diaphoresis speaks in full clear sentences Eyes: PERRL EOMI. Head: Atraumatic. Nose: No congestion/rhinnorhea. Mouth/Throat: No trismus Neck: No stridor.   Cardiovascular: Normal rate, regular rhythm. Grossly normal heart sounds.  Good peripheral circulation. Respiratory: Normal respiratory effort.  No retractions. Lungs CTAB and moving good air Gastrointestinal: Soft nontender Musculoskeletal: No lower extremity edema   Neurologic:  Normal speech and language. No gross focal neurologic deficits are appreciated. Skin:  Skin is warm, dry and intact. No rash noted. Psychiatric: Somewhat anxious appearing    ____________________________________________   DIFFERENTIAL  Acute coronary syndrome, stable angina, pulmonary embolism, pneumothorax, Boerhaave syndrome, aortic dissection ____________________________________________   LABS (all labs ordered are listed, but only abnormal results are displayed)  Labs Reviewed  BASIC METABOLIC PANEL - Abnormal; Notable for the following:       Result Value   Sodium 134 (*)    Glucose, Bld 201 (*)    All other components within normal limits  LIPID PANEL  - Abnormal; Notable for the following:    Triglycerides 242 (*)    VLDL 48 (*)    All other components within normal limits  BASIC METABOLIC PANEL - Abnormal; Notable for the following:    Glucose, Bld 119 (*)    Calcium 8.7 (*)    Anion gap  4 (*)    All other components within normal limits  CBC - Abnormal; Notable for the following:    RBC 3.65 (*)    Hemoglobin 11.9 (*)    All other components within normal limits  GLUCOSE, CAPILLARY - Abnormal; Notable for the following:    Glucose-Capillary 175 (*)    All other components within normal limits  GLUCOSE, CAPILLARY - Abnormal; Notable for the following:    Glucose-Capillary 139 (*)    All other components within normal limits  GLUCOSE, CAPILLARY - Abnormal; Notable for the following:    Glucose-Capillary 126 (*)    All other components within normal limits  GLUCOSE, CAPILLARY - Abnormal; Notable for the following:    Glucose-Capillary 115 (*)    All other components within normal limits  MRSA PCR SCREENING  CBC  TROPONIN I  TSH  TROPONIN I  TROPONIN I  TROPONIN I    First troponin with no signs of ischemia __________________________________________  EKG  ED ECG REPORT I, Merrily BrittleNeil Laurance Heide, the attending physician, personally viewed and interpreted this ECG.  Date: 06/09/2017 Rate: 78 Rhythm: normal sinus rhythm QRS Axis: Leftward Intervals: normal ST/T Wave abnormalities: normal Narrative Interpretation: Inferior and lateral Q waves poor R-wave progression abnormal EKG  ____________________________________________  RADIOLOGY  Chest x-ray with no acute disease ____________________________________________   PROCEDURES  Procedure(s) performed: no  Procedures  Critical Care performed: no  Observation: no ____________________________________________   INITIAL IMPRESSION / ASSESSMENT AND PLAN / ED COURSE  Pertinent labs & imaging results that were available during my care of the patient were reviewed  by me and considered in my medical decision making (see chart for details).  The patient arrives well-appearing although with a concerning story and a significant cardiac history. Her heart score is 6. Although her first troponin is negative she'll require inpatient admission for full rule out and likely provocative testing while in house.      ____________________________________________   FINAL CLINICAL IMPRESSION(S) / ED DIAGNOSES  Final diagnoses:  Chest pain, unspecified type      NEW MEDICATIONS STARTED DURING THIS VISIT:  Current Discharge Medication List       Note:  This document was prepared using Dragon voice recognition software and may include unintentional dictation errors.     Merrily Brittleifenbark, Sina Sumpter, MD 06/10/17 1601

## 2017-06-09 NOTE — ED Notes (Signed)
NAD noted at this time. Pt resting in bed, eating graham crackers. Pt is alert and oriented. Denies any further needs, will continue to monitor for further patient needs at this time.

## 2017-06-09 NOTE — ED Notes (Signed)
Dr. Rifenbark at bedside at this time.  

## 2017-06-09 NOTE — H&P (Signed)
[facility] [place] [facility]   PATIENT NAME: Jessica Donovan    MR#:  [id number]  DATE OF BIRTH:  July 25, 1946  DATE OF ADMISSION:  06/09/2017  PRIMARY CARE PHYSICIAN: Garlon Hatchet, MD   REQUESTING/REFERRING PHYSICIAN: Dr. Merrily Brittle  CHIEF COMPLAINT:   Chief Complaint  Patient presents with  . Chest Pain    HISTORY OF PRESENT ILLNESS:  Jessica Donovan  is a 71 y.o. female with a known history of  Myalgias, collagenous vascular disease and chronic low back pain, CAD status post stents in 2015 at [facility], diabetes, hypertension presents to hospital secondary to chest pain. Patient was recently admitted to Hospital about 3 weeks ago for colitis. She says at the time she's also had occasional chest discomfort. Most of them were addressed and short-lived. Not associated with any nausea or vomiting. She has chronic muscle aches and other complaints. She called her and our nurse line when she started having chest pains worse in the last couple of days who have recommended that she needs to come to the emergency room. Patient has multiple complaints, very anxious about a lot of things. Troponins are negative in the emergency room. EKG does not show any acute findings. She is being admitted under observation.  PAST MEDICAL HISTORY:   Past Medical History:  Diagnosis Date  . Chronic back pain   . Collagen vascular disease (HCC)   . Coronary artery disease   . Diabetes mellitus without complication (HCC)   . Hypertension   . Low back pain   . Myocardial infarction (HCC)   . Osteomyelitis of toe (HCC) 01/23/2016  . Stroke [facility])     PAST SURGICAL HISTORY:   Past Surgical History:  Procedure Laterality Date  . AMPUTATION TOE Left 11/10/2015   Procedure: AMPUTATION TOE;  Surgeon: Linus Galas, MD;  Location: [facility] ORS;  Service: Podiatry;  Laterality: Left;  . AMPUTATION TOE Left 01/24/2016   Procedure: AMPUTATION TOE (2nd mpj);  Surgeon: Linus Galas, DPM;   Location: [facility] ORS;  Service: Podiatry;  Laterality: Left;  . CARDIAC CATHETERIZATION N/A 11/12/2015   Procedure: Left Heart Cath and Coronary Angiography;  Surgeon: Marcina Millard, MD;  Location: [facility] INVASIVE CV LAB;  Service: Cardiovascular;  Laterality: N/A;  . JOINT REPLACEMENT    . TOE AMPUTATION      SOCIAL HISTORY:   Social History  Substance Use Topics  . Smoking status: Never Smoker  . Smokeless tobacco: Never Used  . Alcohol use No    FAMILY HISTORY:   Family History  Problem Relation Age of Onset  . CAD Mother   . CAD Father     DRUG ALLERGIES:   Allergies  Allergen Reactions  . Codeine Nausea And Vomiting  . Influenza Vaccines Other (See Comments)    Reaction:  Caused pt to pass out   . Methadone Hives and Itching  . Percocet [Oxycodone-Acetaminophen] Nausea And Vomiting  . Tetanus Toxoids Swelling and Other (See Comments)    Reaction:  Swelling at injection site  . Tetracyclines & Related Rash    REVIEW OF SYSTEMS:   Review of Systems  Constitutional: Positive for malaise/fatigue. Negative for chills, fever and weight loss.  HENT: Negative for ear discharge, ear pain, hearing loss and nosebleeds.   Eyes: Negative for blurred vision, double vision and photophobia.  Respiratory: Positive for shortness of breath. Negative for cough, hemoptysis and wheezing.   Cardiovascular: Positive for chest pain and palpitations. Negative for orthopnea and leg swelling.  Gastrointestinal: Positive for abdominal pain, diarrhea and nausea. Negative for constipation, heartburn, melena and vomiting.  Genitourinary: Negative for dysuria, frequency and urgency.  Musculoskeletal: Positive for back pain and myalgias. Negative for neck pain.  Skin: Negative for rash.  Neurological: Negative for dizziness, sensory change, speech change, focal weakness and headaches.  Endo/Heme/Allergies: Does not bruise/bleed easily.  Psychiatric/Behavioral: Negative for depression. The  patient is nervous/anxious.     MEDICATIONS AT HOME:   Prior to Admission medications   Medication Sig Start Date End Date Taking? Authorizing Provider  acetaminophen (TYLENOL) 500 MG tablet Take 1,000 mg by mouth every 4 (four) hours as needed for mild pain or headache.    Yes [provider]  ALPRAZolam Prudy Feeler) 1 MG tablet Take 1 mg by mouth 3 (three) times daily.  02/08/17  Yes [provider]  aspirin EC 81 MG tablet Take 81 mg by mouth daily.   Yes [provider]  atorvastatin (LIPITOR) 20 MG tablet Take 20 mg by mouth at bedtime.    Yes [provider]  Biotin 5 MG CAPS Take 5 mg by mouth daily.    Yes [provider]  celecoxib (CELEBREX) 200 MG capsule Take 200 mg by mouth daily.    Yes [provider]  cyclobenzaprine (FLEXERIL) 10 MG tablet Take 1 tablet (10 mg total) by mouth 3 (three) times daily as needed for muscle spasms. 01/27/16  Yes Enid Baas, MD  diphenoxylate-atropine (LOMOTIL) 2.5-0.025 MG tablet Take 1 tablet by mouth every 4 (four) hours as needed for diarrhea or loose stools.    Yes [provider]  ferrous sulfate 325 (65 FE) MG tablet Take 325 mg by mouth daily with breakfast.   Yes [provider]  fesoterodine (TOVIAZ) 8 MG TB24 tablet Take 8 mg by mouth at bedtime.    Yes [provider]  furosemide (LASIX) 20 MG tablet Take 1 tablet (20 mg total) by mouth daily as needed. Patient taking differently: Take 20 mg by mouth daily as needed for fluid.  03/01/17  Yes [name], MD  insulin aspart (NOVOLOG) 100 UNIT/ML injection Inject 3-15 Units into the skin 3 (three) times daily with meals as needed for high blood sugar. Pt uses as needed per sliding scale:    Less than 140:  0 units  140-180:  3 units 181-220:  4 units 221- 260:  6 units 261- 320:  8 units 321-360:  10 units 361-400:  12 units Greater than 400:  15 units   Yes [provider]  Insulin Glargine  (TOUJEO SOLOSTAR) 300 UNIT/ML SOPN Inject 30 Units into the skin daily. 03/01/17  Yes [name], MD  isosorbide mononitrate (IMDUR) 30 MG 24 hr tablet Take 30 mg by mouth daily.    Yes [provider]  liothyronine (CYTOMEL) 25 MCG tablet Take 25 mcg by mouth daily.   Yes [provider]  lisinopril (PRINIVIL,ZESTRIL) 20 MG tablet Take 20 mg by mouth daily.   Yes [provider]  metoprolol tartrate (LOPRESSOR) 25 MG tablet Take 25 mg by mouth 2 (two) times daily. 01/28/17  Yes [provider]  mirtazapine (REMERON) 15 MG tablet Take 15 mg by mouth at bedtime.   Yes [provider]  morphine (MS CONTIN) 15 MG 12 hr tablet Take 1 tablet (15 mg total) by mouth every 12 (twelve) hours. 05/23/17  Yes Auburn Bilberry, MD  morphine (MSIR) 15 MG tablet Take 1 tablet (15 mg total) by mouth 3 (  three) times daily as needed for severe pain. 05/23/17  Yes Auburn BilberryPatel, Shreyang, MD  Multiple Vitamin (MULTIVITAMIN WITH MINERALS) TABS tablet Take 1 tablet by mouth daily.   Yes [provider]  nitroGLYCERIN (NITROSTAT) 0.4 MG SL tablet Place 0.4 mg under the tongue every 5 (five) minutes as needed for chest pain. Reported on 04/16/2016   Yes [provider]  omeprazole (PRILOSEC) 20 MG capsule Take 20 mg by mouth daily.   Yes [provider]  pregabalin (LYRICA) 150 MG capsule Take 1 capsule (150 mg total) by mouth 2 (two) times daily. Patient taking differently: Take 150 mg by mouth 3 (three) times daily.  01/19/17  Yes Milagros LollSudini, Srikar, MD  promethazine (PHENERGAN) 25 MG tablet Take 25 mg by mouth every 6 (six) hours as needed for nausea.    Yes [provider]  traZODone (DESYREL) 100 MG tablet Take 100 mg by mouth at bedtime. 01/28/17  Yes [provider]  venlafaxine XR (EFFEXOR-XR) 75 MG 24 hr capsule Take 225 mg by mouth daily.   Yes [provider]  vitamin B-12 (CYANOCOBALAMIN) 1000 MCG tablet Take 1,000 mcg by mouth daily.    Yes [provider]  Vitamin D, Ergocalciferol, (DRISDOL) 50000 units CAPS capsule Take 50,000 Units by mouth every Sunday.    Yes [provider]      VITAL SIGNS:  Blood pressure 104/61, pulse 68, temperature 98.2 F (36.8 C), temperature source Oral, resp. rate 18, height 5\' 3"  (1.6 m), weight 86.6 kg (191 lb), SpO2 92 %.  PHYSICAL EXAMINATION:   Physical Exam  GENERAL:  71 y.o.-year-old patient lying in the bed with no acute distress.  EYES: Pupils equal, round, reactive to light and accommodation. No scleral icterus. Extraocular muscles intact. Minimal exopthalmosis noted. HEENT: Head atraumatic, normocephalic. Oropharynx and nasopharynx clear.  NECK:  Supple, no jugular venous distention. No thyroid enlargement, no tenderness.  LUNGS: Normal breath sounds bilaterally, no wheezing, rales,rhonchi or crepitation. No use of accessory muscles of respiration. Decreased bibasilar breath sounds noted. CARDIOVASCULAR: S1, S2 normal. No murmurs, rubs, or gallops.  ABDOMEN: Soft, nontender, nondistended. Bowel sounds present. No organomegaly or mass.  EXTREMITIES: No pedal edema, cyanosis, or clubbing.  NEUROLOGIC: Cranial nerves II through XII are intact. Muscle strength 5/5 in all extremities. Sensation intact. Gait not checked.  PSYCHIATRIC: The patient is alert and oriented x 3.  SKIN: No obvious rash, lesion, or ulcer.   LABORATORY PANEL:   CBC  Recent Labs Lab 06/09/17 1222  WBC 5.8  HGB 13.3  HCT 39.4  PLT 241   ------------------------------------------------------------------------------------------------------------------  Chemistries   Recent Labs Lab 06/09/17 1222  NA 134*  K 4.2  CL 101  CO2 27  GLUCOSE 201*  BUN 19  CREATININE 0.51  CALCIUM 9.4   ------------------------------------------------------------------------------------------------------------------  Cardiac Enzymes  Recent Labs Lab 06/09/17 1222  TROPONINI <0.03    ------------------------------------------------------------------------------------------------------------------  RADIOLOGY:  Dg Chest 2 View  Result Date: 06/09/2017 CLINICAL DATA:  Chest pain EXAM: CHEST  2 VIEW COMPARISON:  05/19/2017 FINDINGS: The heart size and mediastinal contours are within normal limits. Both lungs are clear. The visualized skeletal structures are unremarkable. IMPRESSION: No active cardiopulmonary disease. Electronically Signed   By: Signa Kellaylor  Stroud M.D.   On: 06/09/2017 12:56   Ct Head Wo Contrast  Result Date: 06/09/2017 CLINICAL DATA:  Status post fall x2 since a hospitalization earlier this month. EXAM: CT HEAD WITHOUT CONTRAST TECHNIQUE: Contiguous axial images were obtained from the base of the  skull through the vertex without intravenous contrast. COMPARISON:  Head CT scan 02/23/2017 and 01/17/2017. Maxillofacial CT scan 01/17/2017. FINDINGS: Brain: There is some cortical atrophy and chronic microvascular ischemic change. No evidence of acute abnormality including hemorrhage, infarct, mass lesion, mass effect, midline shift or abnormal extra-axial fluid collection. No hydrocephalus or pneumocephalus. Vascular: Atherosclerosis noted. Skull: Intact. Sinuses/Orbits: Paranasal sinuses are clear. 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 Other: None. IMPRESSION: No acute abnormality. Atrophy and chronic microvascular ischemic change. Atherosclerosis. 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 Electronically Signed   By: Drusilla Kanner M.D.   On: 06/09/2017 14:35    EKG:   Orders placed or performed during the hospital encounter of 06/09/17  . EKG 12-Lead  . EKG 12-Lead  . ED EKG within 10 minutes  . ED EKG within 10 minutes    IMPRESSION AND PLAN:    Maryhelen Lindler  is a 71 y.o. female with a known history of  Myalgias, collagenous vascular disease and chronic low back pain, CAD status post stents in 2015 at [facility], diabetes, hypertension presents to hospital secondary to chest pain.  #1 unstable angina-admit to telemetry, recycle troponins. -Stress test ordered in a.m. cardiology consultation -Last stress test as outpatient from September 2017 was normal. -Continue cardiac medications  #2 hypertension-continue Imdur, metoprolol, lisinopril.  #3 diabetes mellitus-on Lantus, add sliding scale insulin.  #4 chronic low back pain-continue MS Contin and when necessary morphine  #5 hyperlipidemia-check lipid panel and also on statin.  #6 DVT prophylaxis-on Lovenox   All the records are reviewed and case discussed with ED provider. Management plans discussed with the patient, family and they are in agreement.  CODE STATUS: Full code  TOTAL TIME TAKING CARE OF THIS PATIENT: 50 minutes.    Ertha Nabor M.D on 06/09/2017 at 4:07 PM  Between 7am to 6pm - Pager - [phone]  After 6pm go to [email or web address] - Social research officer, government  [facility]  Office  [phone]  CC: Primary care physician; Garlon Hatchet, MD

## 2017-06-09 NOTE — ED Notes (Signed)
Pt returned from CT °

## 2017-06-09 NOTE — ED Notes (Signed)
Pt taken to CT.

## 2017-06-09 NOTE — ED Triage Notes (Signed)
Pt reports left side chest pain radiating to her left arm for approximately three weeks. Pt states she thinks the stents in her heart have moved. Pt states she was recently hospitalized and had a MRI that found a thirty pound mass in her abdomen. Pt states after the MRI she saw blinking lights, had a lightning like sensation in her head and a feeling of falling down an elevator shaft. Pt states this is why she thinks the stents in her heart have moved. Pt also states she would like to have a colonoscopy done while she is here.

## 2017-06-09 NOTE — Progress Notes (Signed)
Patient arrived to unit via stretcher. Pt is AAOx4. DNR code status, IV intact and assisted to the bathroom.  Pt oriented to room, plan of care reviewed with patient. B/l skin breakdown to lateral aspect of ankle. No skin breakdown on sacrum. Falls precautions maintained, call bell in reach. Will continue to monitor.

## 2017-06-10 ENCOUNTER — Observation Stay (HOSPITAL_BASED_OUTPATIENT_CLINIC_OR_DEPARTMENT_OTHER): Payer: PPO

## 2017-06-10 ENCOUNTER — Encounter: Payer: Self-pay | Admitting: Radiology

## 2017-06-10 DIAGNOSIS — R079 Chest pain, unspecified: Secondary | ICD-10-CM | POA: Diagnosis not present

## 2017-06-10 DIAGNOSIS — I2510 Atherosclerotic heart disease of native coronary artery without angina pectoris: Secondary | ICD-10-CM | POA: Diagnosis not present

## 2017-06-10 DIAGNOSIS — R0789 Other chest pain: Secondary | ICD-10-CM

## 2017-06-10 DIAGNOSIS — F419 Anxiety disorder, unspecified: Secondary | ICD-10-CM

## 2017-06-10 DIAGNOSIS — E119 Type 2 diabetes mellitus without complications: Secondary | ICD-10-CM | POA: Diagnosis not present

## 2017-06-10 DIAGNOSIS — Z79899 Other long term (current) drug therapy: Secondary | ICD-10-CM

## 2017-06-10 DIAGNOSIS — R296 Repeated falls: Secondary | ICD-10-CM | POA: Diagnosis not present

## 2017-06-10 DIAGNOSIS — F329 Major depressive disorder, single episode, unspecified: Secondary | ICD-10-CM | POA: Diagnosis not present

## 2017-06-10 LAB — GLUCOSE, CAPILLARY
GLUCOSE-CAPILLARY: 115 mg/dL — AB (ref 65–99)
Glucose-Capillary: 126 mg/dL — ABNORMAL HIGH (ref 65–99)

## 2017-06-10 LAB — CBC
HEMATOCRIT: 35.2 % (ref 35.0–47.0)
Hemoglobin: 11.9 g/dL — ABNORMAL LOW (ref 12.0–16.0)
MCH: 32.6 pg (ref 26.0–34.0)
MCHC: 33.8 g/dL (ref 32.0–36.0)
MCV: 96.4 fL (ref 80.0–100.0)
PLATELETS: 215 10*3/uL (ref 150–440)
RBC: 3.65 MIL/uL — AB (ref 3.80–5.20)
RDW: 14.1 % (ref 11.5–14.5)
WBC: 6 10*3/uL (ref 3.6–11.0)

## 2017-06-10 LAB — BASIC METABOLIC PANEL
Anion gap: 4 — ABNORMAL LOW (ref 5–15)
BUN: 17 mg/dL (ref 6–20)
CHLORIDE: 104 mmol/L (ref 101–111)
CO2: 31 mmol/L (ref 22–32)
Calcium: 8.7 mg/dL — ABNORMAL LOW (ref 8.9–10.3)
Creatinine, Ser: 0.72 mg/dL (ref 0.44–1.00)
GFR calc Af Amer: >60 mL/min (ref >60)
GFR calc non Af Amer: >60 mL/min (ref >60)
Glucose, Bld: 119 mg/dL — ABNORMAL HIGH (ref 65–99)
POTASSIUM: 4.3 mmol/L (ref 3.5–5.1)
Sodium: 139 mmol/L (ref 135–145)

## 2017-06-10 LAB — NM MYOCAR MULTI W/SPECT W/WALL MOTION / EF
CHL CUP NUCLEAR SSS: 2
CSEPPHR: 80 {beats}/min
LV sys vol: 15 mL
LVDIAVOL: 53 mL (ref 46–106)
NUC STRESS TID: 0.77
Percent HR: 53 %
Rest HR: 63 {beats}/min
SDS: 1
SRS: 1

## 2017-06-10 LAB — TROPONIN I
Troponin I: <0.03 ng/mL (ref <0.03)
Troponin I: <0.03 ng/mL (ref <0.03)

## 2017-06-10 MED ORDER — REGADENOSON 0.4 MG/5ML IV SOLN
0.4000 mg | Freq: Once | INTRAVENOUS | Status: AC
  Administered 2017-06-10: 0.4 mg via INTRAVENOUS

## 2017-06-10 MED ORDER — TECHNETIUM TC 99M TETROFOSMIN IV KIT
33.1000 | PACK | Freq: Once | INTRAVENOUS | Status: AC | PRN
  Administered 2017-06-10: 33.1 via INTRAVENOUS

## 2017-06-10 MED ORDER — TECHNETIUM TC 99M TETROFOSMIN IV KIT
13.0000 | PACK | Freq: Once | INTRAVENOUS | Status: AC | PRN
  Administered 2017-06-10: 14.34 via INTRAVENOUS

## 2017-06-10 NOTE — Progress Notes (Signed)
Patient has a signed DNR in her chart but is listed as a FULL CODE in computer.  Dr. Sherryll BurgerShah paged to address this.

## 2017-06-10 NOTE — Consult Note (Signed)
Cardiology Consultation Note  Patient ID: Jessica Donovan, MRN: [id number], DOB/AGE: 1946-01-10 71 y.o. Admit date: 06/09/2017   Date of Consult: 06/10/2017 Primary Physician: Garlon Hatchet, MD Primary Cardiologist: Dr. Darrold Junker, MD (wants to change to [facility]- consult by Mariah Milling) Requesting Physician: Dr. Nemiah Commander, MD  Chief Complaint: CP Reason for Consult: CP  HPI: Jessica Donovan is a 71 y.o. female who is being seen today for the evaluation of CP at the request of Dr. Wiliam Ke, MD. 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   Previously followed by cardiology in [place], most recently being seen by Dr. Darrold Junker in 08/2016 for chronic chest pain with Lexiscan Myoview at that time showing no evidence of ischemia with EF 84%. Prior echo in 02/2016 showed normal left ventricular function, with LV ejection fraction of 55-60% without significant valvular abnormalities. She was recently admitted to [facility] in late May for UTI, colitis, and confusion. Treated with ABX per IM.  She reports to me today a history of she is "living on borrowed time," her "heart will stop 95 to 97 times every hour," and her "heart has stopped before." She also states she has a "hole in my colon." Review of her chart does not indicate objective information related to these issues.   She states for the past 2-3 weeks, since she was discharged in late May she has noted left sided chest pain that is constant and not associated with exertion. Non associated symptoms. She has been concerned that her previously placed stents "have fallen out." Vitals have been stable with a soft BP. She has ruled  out. LDL 64, TSH normal, SCr 0.51, K+ 4.2, wbc 5.8, hgb 13.3, plt 241. No UA. CT head not acute. CXR negative. EKG as below. Currently notes left-sided chest pain.     Past Medical History:  Diagnosis Date  . Anxiety   . Chronic back pain   . Collagen vascular disease (HCC)   . Coronary artery disease    a. s/p PCI/DES to dRCA & mLCx in 2014 with repeat LHC in 10/2015 showing patent stents  . Diabetes mellitus without complication (HCC)   . Hypertension   . Low back pain   . Myocardial infarction (HCC)   . Osteomyelitis of toe (HCC) 01/23/2016  . Stroke [facility])       Most Recent Cardiac Studies: As above   Surgical History:  Past Surgical History:  Procedure Laterality Date  . AMPUTATION TOE Left 11/10/2015   Procedure: AMPUTATION TOE;  Surgeon: Linus Galas, MD;  Location: [facility] ORS;  Service: Podiatry;  Laterality: Left;  . AMPUTATION TOE Left 01/24/2016   Procedure: AMPUTATION TOE (2nd mpj);  Surgeon: Linus Galas, DPM;  Location: [facility] ORS;  Service: Podiatry;  Laterality: Left;  . CARDIAC CATHETERIZATION N/A 11/12/2015   Procedure: Left Heart Cath and Coronary Angiography;  Surgeon: Marcina Millard, MD;  Location: [facility] INVASIVE CV LAB;  Service: Cardiovascular;  Laterality: N/A;  . JOINT REPLACEMENT    . TOE AMPUTATION       Home Meds: Prior to Admission medications   Medication Sig Start Date End Date Taking? Authorizing Provider  acetaminophen (TYLENOL) 500 MG tablet Take 1,000 mg by  mouth every 4 (four) hours as needed for mild pain or headache.    Yes [provider]  ALPRAZolam Prudy Feeler) 1 MG tablet Take 1 mg by mouth 3 (three) times daily.  02/08/17  Yes [provider]  aspirin EC 81 MG tablet Take 81 mg by mouth daily.   Yes [provider]  atorvastatin (LIPITOR) 20 MG tablet Take 20 mg by mouth at bedtime.    Yes [provider]  Biotin 5 MG CAPS Take 5 mg by mouth daily.    Yes [provider]  celecoxib (CELEBREX) 200 MG  capsule Take 200 mg by mouth daily.    Yes [provider]  cyclobenzaprine (FLEXERIL) 10 MG tablet Take 1 tablet (10 mg total) by mouth 3 (three) times daily as needed for muscle spasms. 01/27/16  Yes Enid Baas, MD  diphenoxylate-atropine (LOMOTIL) 2.5-0.025 MG tablet Take 1 tablet by mouth every 4 (four) hours as needed for diarrhea or loose stools.    Yes [provider]  ferrous sulfate 325 (65 FE) MG tablet Take 325 mg by mouth daily with breakfast.   Yes [provider]  fesoterodine (TOVIAZ) 8 MG TB24 tablet Take 8 mg by mouth at bedtime.    Yes [provider]  furosemide (LASIX) 20 MG tablet Take 1 tablet (20 mg total) by mouth daily as needed. Patient taking differently: Take 20 mg by mouth daily as needed for fluid.  03/01/17  Yes [name], MD  insulin aspart (NOVOLOG) 100 UNIT/ML injection Inject 3-15 Units into the skin 3 (three) times daily with meals as needed for high blood sugar. Pt uses as needed per sliding scale:    Less than 140:  0 units  140-180:  3 units 181-220:  4 units 221- 260:  6 units 261- 320:  8 units 321-360:  10 units 361-400:  12 units Greater than 400:  15 units   Yes [provider]  Insulin Glargine (TOUJEO SOLOSTAR) 300 UNIT/ML SOPN Inject 30 Units into the skin daily. 03/01/17  Yes [name], MD  isosorbide mononitrate (IMDUR) 30 MG 24 hr tablet Take 30 mg by mouth daily.    Yes [provider]  liothyronine (CYTOMEL) 25 MCG tablet Take 25 mcg by mouth daily.   Yes [provider]  lisinopril (PRINIVIL,ZESTRIL) 20 MG tablet Take 20 mg by mouth daily.   Yes [provider]  metoprolol tartrate (LOPRESSOR) 25 MG tablet Take 25 mg by mouth 2 (two) times daily. 01/28/17  Yes [provider]  mirtazapine (REMERON) 15 MG tablet Take 15 mg by mouth at bedtime.   Yes [provider]  morphine (MS CONTIN) 15 MG 12 hr tablet Take 1 tablet (15 mg total) by mouth every  12 (twelve) hours. 05/23/17  Yes Auburn Bilberry, MD  morphine (MSIR) 15 MG tablet Take 1 tablet (15 mg total) by mouth 3 (three) times daily as needed for severe pain. 05/23/17  Yes Auburn Bilberry, MD  Multiple Vitamin (MULTIVITAMIN WITH MINERALS) TABS tablet Take 1 tablet by mouth daily.   Yes [provider]  nitroGLYCERIN (NITROSTAT) 0.4 MG SL tablet Place 0.4 mg under the tongue every 5 (five) minutes as needed for chest pain. Reported on 04/16/2016   Yes [provider]  omeprazole (PRILOSEC) 20 MG capsule Take 20 mg by mouth daily.   Yes [provider]  pregabalin (LYRICA) 150 MG capsule Take 1 capsule (150 mg total) by mouth 2 (two) times daily. Patient  taking differently: Take 150 mg by mouth 3 (three) times daily.  01/19/17  Yes Milagros Loll, MD  promethazine (PHENERGAN) 25 MG tablet Take 25 mg by mouth every 6 (six) hours as needed for nausea.    Yes [provider]  traZODone (DESYREL) 100 MG tablet Take 100 mg by mouth at bedtime. 01/28/17  Yes [provider]  venlafaxine XR (EFFEXOR-XR) 75 MG 24 hr capsule Take 225 mg by mouth daily.   Yes [provider]  vitamin B-12 (CYANOCOBALAMIN) 1000 MCG tablet Take 1,000 mcg by mouth daily.   Yes [provider]  Vitamin D, Ergocalciferol, (DRISDOL) 50000 units CAPS capsule Take 50,000 Units by mouth every Sunday.    Yes [provider]    Inpatient Medications:  . ALPRAZolam  1 mg Oral TID  . aspirin EC  81 mg Oral Daily  . atorvastatin  20 mg Oral QHS  . celecoxib  200 mg Oral Daily  . enoxaparin (LOVENOX) injection  40 mg Subcutaneous Q24H  . ferrous sulfate  325 mg Oral Q breakfast  . fesoterodine  8 mg Oral QHS  . insulin aspart  0-5 Units Subcutaneous QHS  . insulin aspart  0-9 Units Subcutaneous TID WC  . insulin glargine  30 Units Subcutaneous Daily  . isosorbide mononitrate  30 mg Oral Daily  . liothyronine  25 mcg Oral Daily  . lisinopril  20 mg Oral Daily    . metoprolol tartrate  25 mg Oral BID  . mirtazapine  15 mg Oral QHS  . morphine  15 mg Oral Q12H  . multivitamin with minerals  1 tablet Oral Daily  . pantoprazole  40 mg Oral Daily  . pregabalin  150 mg Oral BID  . traZODone  100 mg Oral QHS  . venlafaxine XR  225 mg Oral Daily  . vitamin B-12  1,000 mcg Oral Daily  . [START ON 06/13/2017] Vitamin D (Ergocalciferol)  50,000 Units Oral Q Sun     Allergies:  Allergies  Allergen Reactions  . Codeine Nausea And Vomiting  . Influenza Vaccines Other (See Comments)    Reaction:  Caused pt to pass out   . Methadone Hives and Itching  . Percocet [Oxycodone-Acetaminophen] Nausea And Vomiting  . Tetanus Toxoids Swelling and Other (See Comments)    Reaction:  Swelling at injection site  . Tetracyclines & Related Rash    Social History   Social History  . Marital status: Widowed    Spouse name: N/A  . Number of children: N/A  . Years of education: N/A   Occupational History  . Not on file.   Social History Main Topics  . Smoking status: Never Smoker  . Smokeless tobacco: Never Used  . Alcohol use No  . Drug use: No  . Sexual activity: Not Currently   Other Topics Concern  . Not on file   Social History Narrative  . No narrative on file     Family History  Problem Relation Age of Onset  . CAD Mother   . CAD Father      Review of Systems: Review of Systems  Constitutional: Positive for malaise/fatigue. Negative for chills, diaphoresis, fever and weight loss.  HENT: Negative for congestion.   Eyes: Negative for discharge and redness.  Respiratory: Negative for cough, hemoptysis, sputum production, shortness of breath and wheezing.   Cardiovascular: Positive for chest pain. Negative for palpitations, orthopnea, claudication, leg swelling and PND.  Gastrointestinal: Negative for abdominal pain, blood in  stool, heartburn, melena, nausea and vomiting.  Genitourinary: Negative for hematuria.  Musculoskeletal: Positive  for falls. Negative for myalgias.  Skin: Negative for rash.  Neurological: Positive for weakness. Negative for dizziness, tingling, tremors, sensory change, speech change, focal weakness and loss of consciousness.  Endo/Heme/Allergies: Does not bruise/bleed easily.  Psychiatric/Behavioral: Negative for substance abuse. The patient is nervous/anxious.   All other systems reviewed and are negative.   Labs:  Recent Labs  06/09/17 1222 06/09/17 1807 06/09/17 2331 06/10/17 0545  TROPONINI <0.03 <0.03 <0.03 <0.03   Lab Results  Component Value Date   WBC 6.0 06/10/2017   HGB 11.9 (L) 06/10/2017   HCT 35.2 06/10/2017   MCV 96.4 06/10/2017   PLT 215 06/10/2017     Recent Labs Lab 06/10/17 0545  NA 139  K 4.3  CL 104  CO2 31  BUN 17  CREATININE 0.72  CALCIUM 8.7*  GLUCOSE 119*   Lab Results  Component Value Date   CHOL 158 06/09/2017   HDL 46 06/09/2017   LDLCALC 64 06/09/2017   TRIG 242 (H) 06/09/2017   No results found for: DDIMER  Radiology/Studies:  Dg Chest 2 View  Result Date: 06/09/2017 IMPRESSION: No active cardiopulmonary disease. Electronically Signed   By: Signa Kell M.D.   On: 06/09/2017 12:56   Dg Chest 2 View  Result Date: 05/19/2017 CLINICAL DATA:  Acute hypoxia.  Concern for pneumonia. EXAM: CHEST  2 VIEW COMPARISON:  02/23/2017 FINDINGS: Again noted are low lung volumes. There is no focal airspace disease or pulmonary edema. Heart and mediastinum are within normal limits and stable. Negative for pneumothorax. No large pleural effusions. Multilevel degenerative changes in the thoracic spine. IMPRESSION: Low lung volumes without acute findings. Electronically Signed   By: Richarda Overlie M.D.   On: 05/19/2017 18:58    Ct Head Wo Contrast  Result Date: 06/09/2017 IMPRESSION: No acute abnormality. Atrophy and chronic microvascular ischemic change. Atherosclerosis. Partial visualization of the orbits demonstrating exophthalmos which appears symmetric and  unchanged compared to the 01/17/2017 CT. Electronically Signed   By: Drusilla Kanner M.D.   On: 06/09/2017 14:35      EKG: Interpreted by me showed: NSR, 78 bpm, left axis deviation, prior inferior and anterior infarct, poor R wave progression, nonspecific IVCD Telemetry: Interpreted by me showed: NSR  Weights: Filed Weights   06/09/17 1218 06/09/17 1743  Weight: 191 lb (86.6 kg) 182 lb 11.2 oz (82.9 kg)     Physical Exam: Blood pressure (!) 118/59, pulse 66, temperature 97.5 F (36.4 C), temperature source Oral, resp. rate 18, height 5\' 8"  (1.727 m), weight 182 lb 11.2 oz (82.9 kg), SpO2 90 %. Body mass index is 27.78 kg/m. General: Well developed, well nourished, in no acute distress. Head: Normocephalic, atraumatic, sclera non-icteric, no xanthomas, nares are without discharge.  Neck: Negative for carotid bruits. JVD not elevated. Lungs: Clear bilaterally to auscultation without wheezes, rales, or rhonchi. Breathing is unlabored. Heart: RRR with S1 S2. No murmurs, rubs, or gallops appreciated. Abdomen: Soft, non-tender, non-distended with normoactive bowel sounds. No hepatomegaly. No rebound/guarding. No obvious abdominal masses. Msk:  Strength and tone appear normal for age. Extremities: No clubbing or cyanosis. No edema. Distal pedal pulses are 2+ and equal bilaterally. Neuro: Alert and oriented X 3. No facial asymmetry. No focal deficit. Moves all extremities spontaneously. Psych:  Anxious. Responds to questions appropriately with a normal affect.    Assessment and Plan:  Principal Problem:   Chest pain with moderate risk  for cardiac etiology Active Problems:   Anxiety   Diabetic osteomyelitis (HCC)   Multiple falls   Polypharmacy   CAD (coronary artery disease)    1. Chest pain with moderate risk of cardiac etiology/CAD: -Has ruled out -For nuclear stress test per IM -Symptoms mostly atypical though she does have significant risk factors including prior CAD with  intervention as above, poorly controlled diabetes with diabetic osteomyelitis, HTN, HLD, and overweight -Await stress test results for further recommendations -Continue ASA, Liptior, Imdur -Continue BB as BP allows -May need to titrate Imdur or add Ranexa -Consider non-cardiac etiologies as well  2. Anxiety/possible dementia vs confusion: -Per IM -May benefit from UA to evaluate for clearing of UTI vs persistent infection given her confusion, defer to IM  3. Polypharmacy: -This certainly may be playing a role in her frequent falls -Recommend medication reconcile, defer to IM  4. Frequent falls: -Recommend PT eval  5. IDDM: -Per IM   Signed, Eula Listenyan Ikeem Cleckler, PA-C [facility] Pager: [phone] 06/10/2017, 11:12 AM

## 2017-06-10 NOTE — Discharge Instructions (Signed)

## 2017-06-11 ENCOUNTER — Other Ambulatory Visit: Payer: Self-pay | Admitting: *Deleted

## 2017-06-11 NOTE — Patient Outreach (Signed)
Telephone encounter successful, follow up on recent hospitalization June 20- 21, 2018 for chest pain. View in Epic pt admitted  from ED (observation) 6/20.   Spoke with pt who requested RN CM call back, has company now.    Plan:  As requested by pt, plan to follow up again telephonically later today.    [name] M.   Pierzchala RN CCM [facility]  [phone]

## 2017-06-11 NOTE — Patient Outreach (Signed)
Another attempt made to contact pt telephonically, follow up on recent hospitalization June 20-21, 2018 for chest pain with moderate risk for cardiac etiology.  Called pt earlier today, pt was unable to talk due to having company, requested a call back.  RN CM was providing  pt prior to this hospitalization with community nurse CM services/transition of care for  previous hospitalization 5/30-6/3 for acute cystitis, acute respiratory failure with hypoxia, colitis, frequent falls.   HIPAA compliant voice message left with contact name and number.    Plan:  If no response to voice message left, plan to follow up again next week (next business day).    [name] M.   Pierzchala RN CCM [facility]  [phone]

## 2017-06-12 NOTE — Discharge Summary (Signed)
[facility] [employer] [facility] [place] [facility]   PATIENT NAME: Jessica Donovan    MR#:  [id number]  DATE OF BIRTH:  [birth year]  DATE OF ADMISSION:  06/09/2017   ADMITTING PHYSICIAN: Enid Baas, MD  DATE OF DISCHARGE: 06/10/2017  5:10 PM  PRIMARY CARE PHYSICIAN: Garlon Hatchet, MD   ADMISSION DIAGNOSIS:  Chest pain, unspecified type [R07.9] DISCHARGE DIAGNOSIS:  Principal Problem:   Chest pain with moderate risk for cardiac etiology Active Problems:   Diabetic osteomyelitis (HCC)   Multiple falls   Polypharmacy   CAD (coronary artery disease)   Anxiety  SECONDARY DIAGNOSIS:   Past Medical History:  Diagnosis Date  . Anxiety   . Chronic back pain   . Collagen vascular disease (HCC)   . Coronary artery disease    a. s/p PCI/DES to dRCA & mLCx in 2014 with repeat LHC in 10/2015 showing patent stents  . Diabetes mellitus without complication (HCC)   . Hypertension   . Low back pain   . Myocardial infarction (HCC)   . Osteomyelitis of toe (HCC) 01/23/2016  . Stroke [facility])    HOSPITAL COURSE:  Jessica Donovan  is a 71 y.o. female with a known history of  Myalgias, collagenous vascular disease and chronic low back pain, CAD status post stents in 2015 at [facility], diabetes, hypertension admitted to hospital secondary to chest pain.  1. Chest pain with moderate risk of cardiac etiology/CAD: - Cardiac enzymes negative 3 EKG unchanged from baseline. EKG showing normal sinus rhythm with old inferior MI, poor R-wave progression to the anterior precordial leads EKG unchanged from 05/19/2017 - Long history of changing cardiologists, recent workup including cardiac catheterization showing patent stents. 30% lesions noted on catheterization November 2016 Stress test less than one year ago in September 2017 with no ischemia Recurrent atypical chest pain, likely driven by anxiety Stress test neg this admission - this is likely from Anxiety, already on several meds and she  would like to have meds change by her PCP only DISCHARGE CONDITIONS:  stable CONSULTS OBTAINED:   DRUG ALLERGIES:   Allergies  Allergen Reactions  . Codeine Nausea And Vomiting  . Influenza Vaccines Other (See Comments)    Reaction:  Caused pt to pass out   . Methadone Hives and Itching  . Percocet [Oxycodone-Acetaminophen] Nausea And Vomiting  . Tetanus Toxoids Swelling and Other (See Comments)    Reaction:  Swelling at injection site  . Tetracyclines & Related Rash   DISCHARGE MEDICATIONS:   Allergies as of 06/10/2017      Reactions   Codeine Nausea And Vomiting   Influenza Vaccines Other (See Comments)   Reaction:  Caused pt to pass out    Methadone Hives, Itching   Percocet [oxycodone-acetaminophen] Nausea And Vomiting   Tetanus Toxoids Swelling, Other (See Comments)   Reaction:  Swelling at injection site   Tetracyclines & Related Rash      Medication List    TAKE these medications   acetaminophen 500 MG tablet Commonly known as:  TYLENOL Take 1,000 mg by mouth every 4 (four) hours as needed for mild pain or headache.   ALPRAZolam 1 MG tablet Commonly known as:  XANAX Take 1 mg by mouth 3 (three) times daily.   aspirin EC 81 MG tablet Take 81 mg by mouth daily.   atorvastatin 20 MG tablet Commonly known as:  LIPITOR Take 20 mg by mouth at bedtime.   Biotin 5 MG Caps Take 5 mg by mouth daily.  celecoxib 200 MG capsule Commonly known as:  CELEBREX Take 200 mg by mouth daily.   cyclobenzaprine 10 MG tablet Commonly known as:  FLEXERIL Take 1 tablet (10 mg total) by mouth 3 (three) times daily as needed for muscle spasms.   diphenoxylate-atropine 2.5-0.025 MG tablet Commonly known as:  LOMOTIL Take 1 tablet by mouth every 4 (four) hours as needed for diarrhea or loose stools.   ferrous sulfate 325 (65 FE) MG tablet Take 325 mg by mouth daily with breakfast.   fesoterodine 8 MG Tb24 tablet Commonly known as:  TOVIAZ Take 8 mg by mouth at  bedtime.   furosemide 20 MG tablet Commonly known as:  LASIX Take 1 tablet (20 mg total) by mouth daily as needed. What changed:  reasons to take this   insulin aspart 100 UNIT/ML injection Commonly known as:  novoLOG Inject 3-15 Units into the skin 3 (three) times daily with meals as needed for high blood sugar. Pt uses as needed per sliding scale:    Less than 140:  0 units  140-180:  3 units 181-220:  4 units 221- 260:  6 units 261- 320:  8 units 321-360:  10 units 361-400:  12 units Greater than 400:  15 units   Insulin Glargine 300 UNIT/ML Sopn Commonly known as:  TOUJEO SOLOSTAR Inject 30 Units into the skin daily.   isosorbide mononitrate 30 MG 24 hr tablet Commonly known as:  IMDUR Take 30 mg by mouth daily.   liothyronine 25 MCG tablet Commonly known as:  CYTOMEL Take 25 mcg by mouth daily.   lisinopril 20 MG tablet Commonly known as:  PRINIVIL,ZESTRIL Take 20 mg by mouth daily.   metoprolol tartrate 25 MG tablet Commonly known as:  LOPRESSOR Take 25 mg by mouth 2 (two) times daily.   mirtazapine 15 MG tablet Commonly known as:  REMERON Take 15 mg by mouth at bedtime.   morphine 15 MG 12 hr tablet Commonly known as:  MS CONTIN Take 1 tablet (15 mg total) by mouth every 12 (twelve) hours.   morphine 15 MG tablet Commonly known as:  MSIR Take 1 tablet (15 mg total) by mouth 3 (three) times daily as needed for severe pain.   multivitamin with minerals Tabs tablet Take 1 tablet by mouth daily.   nitroGLYCERIN 0.4 MG SL tablet Commonly known as:  NITROSTAT Place 0.4 mg under the tongue every 5 (five) minutes as needed for chest pain. Reported on 04/16/2016   omeprazole 20 MG capsule Commonly known as:  PRILOSEC Take 20 mg by mouth daily.   pregabalin 150 MG capsule Commonly known as:  LYRICA Take 1 capsule (150 mg total) by mouth 2 (two) times daily. What changed:  when to take this   promethazine 25 MG tablet Commonly known as:  PHENERGAN Take 25 mg by  mouth every 6 (six) hours as needed for nausea.   traZODone 100 MG tablet Commonly known as:  DESYREL Take 100 mg by mouth at bedtime.   venlafaxine XR 75 MG 24 hr capsule Commonly known as:  EFFEXOR-XR Take 225 mg by mouth daily.   vitamin B-12 1000 MCG tablet Commonly known as:  CYANOCOBALAMIN Take 1,000 mcg by mouth daily.   Vitamin D (Ergocalciferol) 50000 units Caps capsule Commonly known as:  DRISDOL Take 50,000 Units by mouth every Sunday.        DISCHARGE INSTRUCTIONS:   DIET:  Regular diet DISCHARGE CONDITION:  Good ACTIVITY:  Activity as tolerated OXYGEN:  Home Oxygen: No.  Oxygen Delivery: room air DISCHARGE LOCATION:  home   If you experience worsening of your admission symptoms, develop shortness of breath, life threatening emergency, suicidal or homicidal thoughts you must seek medical attention immediately by calling 911 or calling your MD immediately  if symptoms less severe.  You Must read complete instructions/literature along with all the possible adverse reactions/side effects for all the Medicines you take and that have been prescribed to you. Take any new Medicines after you have completely understood and accpet all the possible adverse reactions/side effects.   Please note  You were cared for by a hospitalist during your hospital stay. If you have any questions about your discharge medications or the care you received while you were in the hospital after you are discharged, you can call the unit and asked to speak with the hospitalist on call if the hospitalist that took care of you is not available. Once you are discharged, your primary care physician will handle any further medical issues. Please note that NO REFILLS for any discharge medications will be authorized once you are discharged, as it is imperative that you return to your primary care physician (or establish a relationship with a primary care physician if you do not have one) for your  aftercare needs so that they can reassess your need for medications and monitor your lab values.    On the day of Discharge:  VITAL SIGNS:  Blood pressure (!) 110/59, pulse 70, temperature 97.8 F (36.6 C), temperature source Oral, resp. rate 18, height 5\' 8"  (1.727 m), weight 82.9 kg (182 lb 11.2 oz), SpO2 95 %. PHYSICAL EXAMINATION:  GENERAL:  71 y.o.-year-old patient lying in the bed with no acute distress.  EYES: Pupils equal, round, reactive to light and accommodation. No scleral icterus. Extraocular muscles intact.  HEENT: Head atraumatic, normocephalic. Oropharynx and nasopharynx clear.  NECK:  Supple, no jugular venous distention. No thyroid enlargement, no tenderness.  LUNGS: Normal breath sounds bilaterally, no wheezing, rales,rhonchi or crepitation. No use of accessory muscles of respiration.  CARDIOVASCULAR: S1, S2 normal. No murmurs, rubs, or gallops.  ABDOMEN: Soft, non-tender, non-distended. Bowel sounds present. No organomegaly or mass.  EXTREMITIES: No pedal edema, cyanosis, or clubbing.  NEUROLOGIC: Cranial nerves II through XII are intact. Muscle strength 5/5 in all extremities. Sensation intact. Gait not checked.  PSYCHIATRIC: The patient is alert and oriented x 3.  SKIN: No obvious rash, lesion, or ulcer.  DATA REVIEW:   CBC  Recent Labs Lab 06/10/17 0545  WBC 6.0  HGB 11.9*  HCT 35.2  PLT 215    Chemistries   Recent Labs Lab 06/10/17 0545  NA 139  K 4.3  CL 104  CO2 31  GLUCOSE 119*  BUN 17  CREATININE 0.72  CALCIUM 8.7*     Follow-up Information    Garlon HatchetMcConville, Robert, MD. Schedule an appointment as soon as possible for a visit in 1 week(s).   Specialty:  Family Medicine          Management plans discussed with the patient, family and they are in agreement.  CODE STATUS: Prior   TOTAL TIME TAKING CARE OF THIS PATIENT: 45 minutes.    Delfino LovettVipul Briante Loveall M.D on 06/12/2017 at 3:00 PM  Between 7am to 6pm - Pager - [phone]  After 6pm go  to [email or web address] - Social research officer, [email or web address] [email or web address]  [facility] [place] [facility]  Office  [phone]  CC: Primary care physician; Garlon HatchetMcConville, Robert, MD   Note: This dictation  was prepared with Dragon dictation along with smaller phrase technology. Any transcriptional errors that result from this process are unintentional.

## 2017-06-14 ENCOUNTER — Ambulatory Visit: Payer: Self-pay | Admitting: *Deleted

## 2017-06-15 ENCOUNTER — Other Ambulatory Visit: Payer: Self-pay | Admitting: *Deleted

## 2017-06-15 NOTE — Progress Notes (Signed)
She called our office asking for prescription for Cipro. I talked with her @ [phone] and discussed why?  She thinks someone told her she has UTI. I told her that's not even the reason she was in the Hospital for and then she recalls that it may be her PCP who talked about giving Abx for recurrent UTI.  I've asked her to talk with her PCP to decide need for Abx. We don't have any w/up for UTI on last admission so I told her I will not be prescribing Abx for UTI.

## 2017-06-15 NOTE — Patient Outreach (Signed)
Successful telephone outreach to pt, follow up on recent hospitalization June 20-21, 2018 for chest pain with moderate risk for cardiac etiology.   This RN CM was following pt for transition of care for previous hospitalization May 30- May 23, 2017 for frequent falls, acute cystitis, acute respiratory failure with hypoxia, acute colitis.   Spoke with pt, HIPAA identifiers verified.  Pt reports not feeling as well, can hardly do things I want to do, pt sounded groggy during phone call- RN CM addressed to which pt reports still sleepy/did not sleep well last night, worry.  Pt reports no recurrent chest pain, nothing to speak of.  Pt reports no medication changes, taking all medications as ordered, does not have time to review with RN CM at this time.  Pt reports she called MD this morning because completed Cipro for bladder infection, having urgency, no further Cipro given.  Pt reports she had an appointment to follow up with Dr. Emilia BeckMcConville, received a call MD on vacation, need to reschedule, has not called MD office back yet. Pt reports has a dental appointment 7/12, hoping to schedule MD follow up appointment same day as need to get someone to take her.  Pt reports to see [place] tomorrow in [place], hope can do something about sciatic nerve (pain).  RN CM discussed with pt doing a home visit to which pt agreed.   Plan:  As discussed with pt, plan to follow up again  next week- home visit (part of ongoing transition of care).  [name] M.   Pierzchala RN CCM [facility] Care [facility]  [phone]

## 2017-06-16 DIAGNOSIS — M545 Low back pain: Secondary | ICD-10-CM | POA: Diagnosis not present

## 2017-06-16 DIAGNOSIS — M5416 Radiculopathy, lumbar region: Secondary | ICD-10-CM | POA: Diagnosis not present

## 2017-06-18 ENCOUNTER — Ambulatory Visit: Payer: PPO | Admitting: *Deleted

## 2017-06-21 ENCOUNTER — Other Ambulatory Visit: Payer: Self-pay | Admitting: *Deleted

## 2017-06-21 NOTE — Patient Outreach (Signed)
[facility]) Care Management   06/21/2017  Jessica Donovan [birth year] [id number]  Jessica Donovan is an 71 y.o. female  Subjective: Pt reports to see GI MD this week, consult for colonoscopy.  Pt reports also to  See Dr. Delila Spence and Dr. Caryn Section (dentist) on 07/13/17.  Pt requested RN CM to look at her second Toe/right foot- redness traveling down toe (new, started over the weekend), pain started today When on it.  Pt reports she does not have a Podiatrist, needs to find one.   Pt reports she has  Been told to follow up with a Podiatrist for callous on both ankles (outer), saw Dr. Cleda Mccreedy in the  Past but can no longer go back there.  RN CM discussed follow up at Urgent Care to which pt Reports does not want to go today.   Pt reports sugars  running 91,95,96, stopped checking them,  Today was 169 which is high for her.  Pt reports continues to deal with chronic back pain.   Objective:   Vitals:   06/21/17 1330 06/21/17 1445  BP:  102/66  Pulse:  71  Resp: 16       ROS  Physical Exam  Constitutional: She is oriented to person, place, and time. She appears well-developed and well-nourished.  Cardiovascular: Normal rate and regular rhythm.   Respiratory: Effort normal and breath sounds normal.  Musculoskeletal: Normal range of motion. She exhibits edema.  Bilateral ankles, second(hammer) toe/right foot.   Neurological: She is alert and oriented to person, place, and time.  Skin: Skin is warm and dry.  Redness noted top of second (hammer) toe/right foot/trace edema/normal warm to touch.   Psychiatric: She has a normal mood and affect. Her behavior is normal. Judgment and thought content normal.    Encounter Medications:  Per pt taking 50 units of Toujeo daily.  Outpatient Encounter Prescriptions as of 06/21/2017  Medication Sig  . acetaminophen (TYLENOL) 500 MG tablet Take 1,000 mg by mouth every 4 (four) hours as needed for mild pain or headache.   . ALPRAZolam (XANAX)  1 MG tablet Take 1 mg by mouth 3 (three) times daily.   [name] aspirin EC 81 MG tablet Take 81 mg by mouth daily.  [name] atorvastatin (LIPITOR) 20 MG tablet Take 20 mg by mouth at bedtime.   . Biotin 5 MG CAPS Take 5 mg by mouth daily.   . celecoxib (CELEBREX) 200 MG capsule Take 200 mg by mouth daily.   . cyclobenzaprine (FLEXERIL) 10 MG tablet Take 1 tablet (10 mg total) by mouth 3 (three) times daily as needed for muscle spasms.  . diphenoxylate-atropine (LOMOTIL) 2.5-0.025 MG tablet Take 1 tablet by mouth every 4 (four) hours as needed for diarrhea or loose stools.   . ferrous sulfate 325 (65 FE) MG tablet Take 325 mg by mouth daily with breakfast.  . fesoterodine (TOVIAZ) 8 MG TB24 tablet Take 8 mg by mouth at bedtime.   . furosemide (LASIX) 20 MG tablet Take 1 tablet (20 mg total) by mouth daily as needed. (Patient taking differently: Take 20 mg by mouth daily as needed for fluid. )  . insulin aspart (NOVOLOG) 100 UNIT/ML injection Inject 3-15 Units into the skin 3 (three) times daily with meals as needed for high blood sugar. Pt uses as needed per sliding scale:    Less than 140:  0 units  140-180:  3 units 181-220:  4 units 221- 260:  6 units 261- 320:  8 units  321-360:  10 units 361-400:  12 units Greater than 400:  15 units  . Insulin Glargine (TOUJEO SOLOSTAR) 300 UNIT/ML SOPN Inject 30 Units into the skin daily.  . isosorbide mononitrate (IMDUR) 30 MG 24 hr tablet Take 30 mg by mouth daily.   [name] liothyronine (CYTOMEL) 25 MCG tablet Take 25 mcg by mouth daily.  [name] lisinopril (PRINIVIL,ZESTRIL) 20 MG tablet Take 20 mg by mouth daily.  . metoprolol tartrate (LOPRESSOR) 25 MG tablet Take 25 mg by mouth 2 (two) times daily.  . mirtazapine (REMERON) 15 MG tablet Take 15 mg by mouth at bedtime.  [name] morphine (MS CONTIN) 15 MG 12 hr tablet Take 1 tablet (15 mg total) by mouth every 12 (twelve) hours.  [name] morphine (MSIR) 15 MG tablet Take 1 tablet (15 mg total) by mouth 3 (three) times daily as  needed for severe pain.  . Multiple Vitamin (MULTIVITAMIN WITH MINERALS) TABS tablet Take 1 tablet by mouth daily.  . nitroGLYCERIN (NITROSTAT) 0.4 MG SL tablet Place 0.4 mg under the tongue every 5 (five) minutes as needed for chest pain. Reported on 04/16/2016  . omeprazole (PRILOSEC) 20 MG capsule Take 20 mg by mouth daily.  . pregabalin (LYRICA) 150 MG capsule Take 1 capsule (150 mg total) by mouth 2 (two) times daily. (Patient taking differently: Take 150 mg by mouth 3 (three) times daily. )  . promethazine (PHENERGAN) 25 MG tablet Take 25 mg by mouth every 6 (six) hours as needed for nausea.   . traZODone (DESYREL) 100 MG tablet Take 100 mg by mouth at bedtime.  [name] venlafaxine XR (EFFEXOR-XR) 75 MG 24 hr capsule Take 225 mg by mouth daily.  . vitamin B-12 (CYANOCOBALAMIN) 1000 MCG tablet Take 1,000 mcg by mouth daily.  . Vitamin D, Ergocalciferol, (DRISDOL) 50000 units CAPS capsule Take 50,000 Units by mouth every Sunday.    No facility-administered encounter medications on file as of 06/21/2017.     Functional Status:   In your present state of health, do you have any difficulty performing the following activities: 06/21/2017 06/09/2017  Hearing? Y N  Vision? N N  Difficulty concentrating or making decisions? N N  Walking or climbing stairs? Y Y  Dressing or bathing? N Y  Doing errands, shopping? Y -  Conservation officer, nature and eating ? N -  Using the Toilet? N -  In the past six months, have you accidently leaked urine? N -  Do you have problems with loss of bowel control? N -  Managing your Medications? N -  Managing your Finances? N -  Housekeeping or managing your Housekeeping? N -  Some recent data might be hidden    Fall/Depression Screening:    Fall Risk  06/21/2017 03/26/2017 03/09/2017  Falls in the past year? Yes Yes Yes  Number falls in past yr: 2 or more 2 or more 1  Injury with Fall? Yes Yes Yes  Risk Factor Category  High Fall Risk High Fall Risk High Fall Risk  Risk for fall  due to : Impaired balance/gait;Impaired mobility Impaired balance/gait;Impaired mobility Impaired balance/gait;History of fall(s);Impaired mobility  Follow up Falls prevention discussed Falls prevention discussed Falls prevention discussed   PHQ 2/9 Scores 06/21/2017 03/26/2017 03/09/2017 03/09/2016 02/21/2016  PHQ - 2 Score 1 2 0 2 1  PHQ- 9 Score - 5 - 4 6    Assessment:  Pleasant 71 year old woman, lives alone in [place] senior apartments.  Resident Director Wyn Quaker consent form) assists pt  as needed- spoke with her prior to pt's visit.  *CAD:  Recent hospitalization for chest pain. No c/o chest pain today, Nitroglycerin available if needed. *Chronic pain:  Per pt starts in back, down left hip to bottom of foot, pain medication helps.  *Skin integrity: redness in second (hammer) toe/right foot, trace edema, not warm or painful with touch.   RN CM called resident director Angela Burke (on pt's consent) about pt's need to follow up at Urgent     Care tomorrow as pt did not want to go today- transportation arranged by Webb Silversmith for pt to go 7/3. RN     CM relayed this to pt- pt agreed to go tomorrow.  *Diabetes:  Per pt past sugars 91,95,96.  Today 169.   Plan:  As discussed with pt, pt to follow up 7/3 at Urgent care (walk in clinic), have second toe/right foot           Redness assessed- RN CM to do follow up call with pt this week.            As discussed with pt, plan to continue to follow for transition of care, coworker Richarda Osmond RN CM            (covering for this RN CM) will be doing the follow up call next week.    [facility] CM Care Plan Problem One     Most Recent Value  Care Plan Problem One  Risk for readmission related to recent hospitalization for chest pain, 4 admits /2 ED visits in 6 months.   Role Documenting the Problem One  Care Management Coordinator  Care Plan for Problem One  Active  [facility] Long Term Goal   Pt would not readmit to the hospital within the next 31 days   [facility] Long  Term Goal Start Date  06/11/17  Interventions for Problem One Long Term Goal  home visit done- medicatons reviewed, upcoming MD visits, recurrent chest pain   [facility] CM Short Term Goal #1   Pt would schedule post discharge MD appointment within the next 7 days   [facility] CM Short Term Goal #1 Start Date  06/15/17  [facility] CM Short Term Goal #1 Met Date  06/21/17  [facility] CM Short Term Goal #2   Pt would have no chest pain within the next 27 days as evidenced by pt reporting to RN CM during outreach calls   [facility] CM Short Term Goal #2 Start Date  06/15/17  Interventions for Short Term Goal #2  discussed with pt recent chest pain- per pt no recent chest pain/utilized teach back with pt on use of Nitroglycerin SL as needed.     [facility] CM Care Plan Problem Two     Most Recent Value  Care Plan Problem Two  Skin integrity- redness to second toe/right foot   Role Documenting the Problem Two  Care Management Coordinator  Care Plan for Problem Two  Active  [facility] CM Short Term Goal #1   Pt would follow up at Urgent care/have second toe assessed within next 30 hours   [facility] CM Short Term Goal #1 Start Date  06/21/17  Interventions for Short Term Goal #2   RN CM collaborated with pt's emergeny contact- transportation arranged to take pt to Urgent care 7/3 have toe assessed as pt has no Podiatrist.      Zara Chess.   [facility] Care Management  [phone]

## 2017-06-22 ENCOUNTER — Emergency Department: Payer: PPO

## 2017-06-22 ENCOUNTER — Emergency Department
Admission: EM | Admit: 2017-06-22 | Discharge: 2017-06-22 | Disposition: A | Discharge status: Home / Self Care | Payer: PPO | Attending: Emergency Medicine | Admitting: Emergency Medicine

## 2017-06-22 ENCOUNTER — Other Ambulatory Visit: Payer: Self-pay | Admitting: *Deleted

## 2017-06-22 ENCOUNTER — Encounter: Payer: Self-pay | Admitting: Medical Oncology

## 2017-06-22 DIAGNOSIS — S99921A Unspecified injury of right foot, initial encounter: Secondary | ICD-10-CM | POA: Diagnosis not present

## 2017-06-22 DIAGNOSIS — K5900 Constipation, unspecified: Secondary | ICD-10-CM | POA: Diagnosis not present

## 2017-06-22 DIAGNOSIS — L03031 Cellulitis of right toe: Secondary | ICD-10-CM | POA: Insufficient documentation

## 2017-06-22 DIAGNOSIS — R109 Unspecified abdominal pain: Secondary | ICD-10-CM

## 2017-06-22 DIAGNOSIS — M79674 Pain in right toe(s): Secondary | ICD-10-CM | POA: Insufficient documentation

## 2017-06-22 DIAGNOSIS — M79671 Pain in right foot: Secondary | ICD-10-CM | POA: Diagnosis not present

## 2017-06-22 DIAGNOSIS — R1033 Periumbilical pain: Secondary | ICD-10-CM | POA: Diagnosis not present

## 2017-06-22 LAB — COMPREHENSIVE METABOLIC PANEL
ALT: 14 U/L (ref 14–54)
AST: 45 U/L — ABNORMAL HIGH (ref 15–41)
Albumin: 4 g/dL (ref 3.5–5.0)
Alkaline Phosphatase: 61 U/L (ref 38–126)
Anion gap: 11 (ref 5–15)
BUN: 23 mg/dL — ABNORMAL HIGH (ref 6–20)
CHLORIDE: 100 mmol/L — AB (ref 101–111)
CO2: 26 mmol/L (ref 22–32)
CREATININE: 0.75 mg/dL (ref 0.44–1.00)
Calcium: 9.2 mg/dL (ref 8.9–10.3)
GFR calc non Af Amer: >60 mL/min (ref >60)
Glucose, Bld: 148 mg/dL — ABNORMAL HIGH (ref 65–99)
Potassium: 4.7 mmol/L (ref 3.5–5.1)
SODIUM: 137 mmol/L (ref 135–145)
Total Bilirubin: 0.7 mg/dL (ref 0.3–1.2)
Total Protein: 6.7 g/dL (ref 6.5–8.1)

## 2017-06-22 LAB — CBC
HEMATOCRIT: 40 % (ref 35.0–47.0)
HEMOGLOBIN: 13.3 g/dL (ref 12.0–16.0)
MCH: 32.5 pg (ref 26.0–34.0)
MCHC: 33.4 g/dL (ref 32.0–36.0)
MCV: 97.5 fL (ref 80.0–100.0)
PLATELETS: 240 10*3/uL (ref 150–440)
RBC: 4.1 MIL/uL (ref 3.80–5.20)
RDW: 14.4 % (ref 11.5–14.5)
WBC: 7.9 10*3/uL (ref 3.6–11.0)

## 2017-06-22 LAB — LIPASE, BLOOD: LIPASE: 16 U/L (ref 11–51)

## 2017-06-22 MED ORDER — FENTANYL CITRATE (PF) 100 MCG/2ML IJ SOLN
25.0000 ug | Freq: Once | INTRAMUSCULAR | Status: AC
  Administered 2017-06-22: 25 ug via INTRAVENOUS
  Filled 2017-06-22: qty 2

## 2017-06-22 MED ORDER — IOPAMIDOL (ISOVUE-300) INJECTION 61%
100.0000 mL | Freq: Once | INTRAVENOUS | Status: AC | PRN
  Administered 2017-06-22: 100 mL via INTRAVENOUS

## 2017-06-22 MED ORDER — AMOXICILLIN-POT CLAVULANATE 875-125 MG PO TABS: 1.0000 | ORAL_TABLET | Freq: Two times a day (BID) | ORAL | 0 refills | Status: AC

## 2017-06-22 MED ORDER — ONDANSETRON HCL 4 MG/2ML IJ SOLN
4.0000 mg | Freq: Once | INTRAMUSCULAR | Status: AC
  Administered 2017-06-22: 4 mg via INTRAVENOUS
  Filled 2017-06-22: qty 2

## 2017-06-22 MED ORDER — IOPAMIDOL (ISOVUE-300) INJECTION 61%
30.0000 mL | Freq: Once | INTRAVENOUS | Status: AC | PRN
  Administered 2017-06-22: 30 mL via ORAL

## 2017-06-22 NOTE — ED Notes (Signed)

## 2017-06-22 NOTE — ED Notes (Addendum)
At this time pt is trying to call a friend for a ride home.

## 2017-06-22 NOTE — ED Notes (Signed)
Pt states she has not been able to contact friend for ride home. Pt states she needs to eat something, sandwich tray given to pt.

## 2017-06-22 NOTE — ED Notes (Signed)
Patient transported to CT 

## 2017-06-22 NOTE — Patient Outreach (Signed)
4:33 pm - This RN CM [facility] case management assistant that pt called office requesting to speak with RN CM as pt is currently at Urgent care/walk in clinic [facility]  Tamika connected RN CM to pt.  Spoke with pt, HIPAA verified.  [facility] clinic  but was told cannot be seen as in the past had it out with receptionist for  pain and nerve MD.  **Note  This RN CM suggested pt at yesterday's home visit to follow up at Urgent care today to have second toe/right foot assessed- per pt redness traveling down toe to foot -new,started over the weekend. Pt reports her foot looks bad, abdomen hurting so bad to which RN CM instructed  pt [facility].   Pt reports she will try that- go to ED - pt not longer on the phone for RN CM to talk to.   Plan:  RN CM to continue to follow pt for transition of care- weekly phone calls.   [name] M.   Pierzchala RN CCM [facility] Care [facility]  [phone]

## 2017-06-22 NOTE — ED Notes (Signed)
Pt's O2 sat was 88% during assessment; placed on 2L O2.
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
## 2017-06-22 NOTE — ED Notes (Signed)
Pt states she needs to use BR, pt stand by assist to in rm BR without difficulty.

## 2017-06-22 NOTE — ED Notes (Signed)
Bandaged and wrapped pt's toe per her request.

## 2017-06-22 NOTE — ED Notes (Signed)
Pt placed in waiting room for cab. First nurse notified of this.

## 2017-06-22 NOTE — ED Triage Notes (Signed)
Pt reports abd pain that began over 1 week ago. Pt states that she has been having NVD also. Pt has multiple complaints at this time, including her rt foot first toe that Is hurting also.

## 2017-06-22 NOTE — ED Notes (Addendum)
Pt drinking contrast solution. Attempted to discuss her reluctance to enter assisted living, as she is not safe at home with all her falls. She states that she takes morphine 2 x per day and xanax 3 x per day. She lives alone and has been admitted several times to ED because of falls and possible overdoses. Pt not interested in discussing.

## 2017-06-22 NOTE — ED Notes (Signed)
Pt back from CT at this time 

## 2017-06-22 NOTE — ED Provider Notes (Signed)
[facility] Emergency Department Provider Note   ____________________________________________   First MD Initiated Contact with Patient 06/22/17 1737     (approximate)  I have reviewed the triage vital signs and the nursing notes.   HISTORY  Chief Complaint Toe Pain and Abdominal Pain   HPI Jessica Donovan is a 71 y.o. female patient complains of pain in the first toe on her right foot this is not the great toe which is been indicated the next toe over toe is reddened and has an abrasion on top. Looks like it's been red for some time. Patient states that actually looking better today and was yesterday. Is about a centimeter extension of the redness up onto the foot. Patient also complains of pain in her abdomen. She says she was having a CAT scan of her timing last week that showed a leaking hole in her colon and poop in her belly. She said she had that here. Review of the old records that show that she had colitis diagnosed on the CT of the abdomen over a month ago. She says the pain in her abdomen around the umbilicus is severe and much worse than it had been. She does not appear to be in much distress however. Patient is not having any nausea vomiting or diarrhea present. Patient does not have a fever.   Past Medical History:  Diagnosis Date  . Anxiety   . Chronic back pain   . Collagen vascular disease (HCC)   . Coronary artery disease    a. s/p PCI/DES to dRCA & mLCx in 2014 with repeat LHC in 10/2015 showing patent stents  . Diabetes mellitus without complication (HCC)   . Hypertension   . Low back pain   . Myocardial infarction (HCC)   . Osteomyelitis of toe (HCC) 01/23/2016  . Stroke [facility](HCC)     Patient Active Problem List   Diagnosis Date Noted  . Anxiety 06/10/2017  . Chest pain with moderate risk for cardiac etiology 06/09/2017  . UTI (urinary tract infection) 05/19/2017  . CAD (coronary artery disease) 05/19/2017  . Chronic narcotic use  05/19/2017  . Cellulitis in diabetic foot (HCC) 05/19/2017  . Opioid overdose 02/23/2017  . Altered mental status 02/23/2017  . Acute colitis 01/19/2017  . Sepsis (HCC) 01/17/2017  . Cellulitis of fourth toe of right foot 04/10/2016  . Somnolence 04/09/2016  . Dementia 04/09/2016  . Cellulitis 04/03/2016  . Hematoma of right parietal scalp 02/23/2016  . Multiple falls 02/23/2016  . Physical debility 02/23/2016  . DM2 (diabetes mellitus, type 2) (HCC) 02/23/2016  . Syncope and collapse 02/23/2016  . Polypharmacy 02/23/2016  . Osteomyelitis of toe (HCC) 01/23/2016  . Rhabdomyolysis 12/07/2015  . ARF (acute renal failure) (HCC) 11/29/2015  . Hypotension 11/29/2015  . 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 Angina effort (HCC) 11/07/2015  . Osteomyelitis (HCC) 11/07/2015    Past Surgical History:  Procedure Laterality Date  . AMPUTATION TOE Left 11/10/2015   Procedure: AMPUTATION TOE;  Surgeon: Linus Galasodd Cline, MD;  Location: [facility] ORS;  Service: Podiatry;  Laterality: Left;  . AMPUTATION TOE Left 01/24/2016   Procedure: AMPUTATION TOE (2nd mpj);  Surgeon: Linus Galasodd Cline, DPM;  Location: [facility] ORS;  Service: Podiatry;  Laterality: Left;  . CARDIAC CATHETERIZATION N/A 11/12/2015   Procedure: Left Heart Cath and Coronary Angiography;  Surgeon: Marcina MillardAlexander Paraschos, MD;  Location: [facility] INVASIVE CV LAB;  Service: Cardiovascular;  Laterality: N/A;  . JOINT REPLACEMENT    . TOE AMPUTATION  Prior to Admission medications   Medication Sig Start Date End Date Taking? Authorizing Provider  acetaminophen (TYLENOL) 500 MG tablet Take 1,000 mg by mouth every 4 (four) hours as needed for mild pain or headache.     [provider]  ALPRAZolam Prudy Feeler) 1 MG tablet Take 1 mg by mouth 3 (three) times daily.  02/08/17   [provider]  amoxicillin-clavulanate (AUGMENTIN) 875-125 MG tablet Take 1 tablet by mouth 2 (two) times daily. 06/22/17 07/02/17  Arnaldo Natal, MD  aspirin EC 81 MG  tablet Take 81 mg by mouth daily.    [provider]  atorvastatin (LIPITOR) 20 MG tablet Take 20 mg by mouth at bedtime.     [provider]  Biotin 5 MG CAPS Take 5 mg by mouth daily.     [provider]  celecoxib (CELEBREX) 200 MG capsule Take 200 mg by mouth daily.     [provider]  cyclobenzaprine (FLEXERIL) 10 MG tablet Take 1 tablet (10 mg total) by mouth 3 (three) times daily as needed for muscle spasms. 01/27/16   Enid Baas, MD  diphenoxylate-atropine (LOMOTIL) 2.5-0.025 MG tablet Take 1 tablet by mouth every 4 (four) hours as needed for diarrhea or loose stools.     [provider]  ferrous sulfate 325 (65 FE) MG tablet Take 325 mg by mouth daily with breakfast.    [provider]  fesoterodine (TOVIAZ) 8 MG TB24 tablet Take 8 mg by mouth at bedtime.     [provider]  furosemide (LASIX) 20 MG tablet Take 1 tablet (20 mg total) by mouth daily as needed. Patient taking differently: Take 20 mg by mouth daily as needed for fluid.  03/01/17   [name], MD  insulin aspart (NOVOLOG) 100 UNIT/ML injection Inject 3-15 Units into the skin 3 (three) times daily with meals as needed for high blood sugar. Pt uses as needed per sliding scale:    Less than 140:  0 units  140-180:  3 units 181-220:  4 units 221- 260:  6 units 261- 320:  8 units 321-360:  10 units 361-400:  12 units Greater than 400:  15 units    [provider]  Insulin Glargine (TOUJEO SOLOSTAR) 300 UNIT/ML SOPN Inject 30 Units into the skin daily. 03/01/17   [name], MD  isosorbide mononitrate (IMDUR) 30 MG 24 hr tablet Take 30 mg by mouth daily.     [provider]  liothyronine (CYTOMEL) 25 MCG tablet Take 25 mcg by mouth daily.    [provider]  lisinopril (PRINIVIL,ZESTRIL) 20 MG tablet Take 20 mg by mouth daily.    [provider]  metoprolol tartrate (LOPRESSOR) 25 MG tablet Take 25 mg by mouth 2 (two)  times daily. 01/28/17   [provider]  mirtazapine (REMERON) 15 MG tablet Take 15 mg by mouth at bedtime.    [provider]  morphine (MS CONTIN) 15 MG 12 hr tablet Take 1 tablet (15 mg total) by mouth every 12 (twelve) hours. 05/23/17   Auburn Bilberry, MD  morphine (MSIR) 15 MG tablet Take 1 tablet (15 mg total) by mouth 3 (three) times daily as needed for severe pain. 05/23/17   Auburn Bilberry, MD  Multiple Vitamin (MULTIVITAMIN WITH MINERALS) TABS tablet Take 1 tablet by mouth daily.    [provider]  nitrofurantoin (MACRODANTIN) 100 MG capsule Take 100 mg by mouth daily.    [provider]  nitroGLYCERIN (NITROSTAT)  0.4 MG SL tablet Place 0.4 mg under the tongue every 5 (five) minutes as needed for chest pain. Reported on 04/16/2016    [provider]  omeprazole (PRILOSEC) 20 MG capsule Take 20 mg by mouth daily.    [provider]  pregabalin (LYRICA) 150 MG capsule Take 1 capsule (150 mg total) by mouth 2 (two) times daily. Patient taking differently: Take 150 mg by mouth 3 (three) times daily.  01/19/17   Milagros Loll, MD  promethazine (PHENERGAN) 25 MG tablet Take 25 mg by mouth every 6 (six) hours as needed for nausea.     [provider]  traZODone (DESYREL) 100 MG tablet Take 100 mg by mouth at bedtime. 01/28/17   [provider]  venlafaxine XR (EFFEXOR-XR) 75 MG 24 hr capsule Take 225 mg by mouth daily.    [provider]  vitamin B-12 (CYANOCOBALAMIN) 1000 MCG tablet Take 1,000 mcg by mouth daily.    [provider]  Vitamin D, Ergocalciferol, (DRISDOL) 50000 units CAPS capsule Take 50,000 Units by mouth every Sunday.     [provider]    Allergies Codeine; Influenza vaccines; Methadone; Percocet [oxycodone-acetaminophen]; Tetanus toxoids; and Tetracyclines & related  Family History  Problem Relation Age of Onset  . CAD Mother   . CAD Father     Social History Social History    Substance Use Topics  . Smoking status: Never Smoker  . Smokeless tobacco: Never Used  . Alcohol use No    Review of Systems  Constitutional: No fever/chills Eyes: No visual changes. ENT: No sore throat. Cardiovascular: Denies chest pain. Respiratory: Denies shortness of breath. Gastrointestinal abdominal pain.  No nausea, no vomiting.  No diarrhea.  No constipation. Genitourinary: Negative for dysuria. Musculoskeletal: Negative for back pain. Skin: Negative for rash. Neurological: Negative for headaches, focal weakness   ____________________________________________   PHYSICAL EXAM:  VITAL SIGNS: ED Triage Vitals  Enc Vitals Group     BP 06/22/17 1647 (!) 157/83     Pulse Rate 06/22/17 1647 (!) 102     Resp 06/22/17 1647 18     Temp 06/22/17 1647 98.2 F (36.8 C)     Temp Source 06/22/17 1647 Oral     SpO2 06/22/17 1647 95 %     Weight 06/22/17 1647 182 lb (82.6 kg)     Height 06/22/17 1647 5\' 8"  (1.727 m)     Head Circumference --      Peak Flow --      Pain Score 06/22/17 1646 8     Pain Loc --      Pain Edu? --      Excl. in GC? --     Constitutional: Alert and oriented. Well appearing and in no acute distress. Eyes: Conjunctivae are normal.  Head: Atraumatic. Nose: No congestion/rhinnorhea. Mouth/Throat: Mucous membranes are moist.  Oropharynx non-erythematous. Neck: No stridor.  Cardiovascular: Normal rate, regular rhythm. Grossly normal heart sounds.  Good peripheral circulation. Respiratory: Normal respiratory effort.  No retractions. Lungs CTAB. Gastrointestinal: Soft Patient complains of tenderness around the umbilicus to palpation. There are no masses palpated. Bowel sounds are normal No distention. No abdominal bruits. No CVA tenderness. Musculoskeletal: No lower extremity tenderness nor edema.  No joint effusions. Toe as described in history of present illness Neurologic:  Normal speech and language. No gross focal neurologic deficits are  appreciated. No gait instability. Skin:  Skin is warm, dry and intact except for on toe Psychiatric: Mood and affect are  normal. Speech and behavior are normal.  ____________________________________________   LABS (all labs ordered are listed, but only abnormal results are displayed)  Labs Reviewed  COMPREHENSIVE METABOLIC PANEL - Abnormal; Notable for the following:       Result Value   Chloride 100 (*)    Glucose, Bld 148 (*)    BUN 23 (*)    AST 45 (*)    All other components within normal limits  LIPASE, BLOOD  CBC  URINALYSIS, COMPLETE (UACMP) WITH MICROSCOPIC   ____________________________________________  EKG   ____________________________________________  RADIOLOGY IMPRESSION: No evidence of acute bony abnormality.   Electronically Signed   By: Harmon Pier M.D.   On: 06/22/2017 18:31 IMPRESSION: Large stool burden throughout the colon. Previously seen colitis changes in the sigmoid colon have resolved. No focal area of inflammation currently present.  No acute findings in the abdomen or pelvis.  Coronary artery disease, aortic atherosclerosis.   Electronically Signed   By: Charlett Nose M.D.   On: 06/22/2017 19:59 ____________________________________________   PROCEDURES  Procedure(s) performed: Procedures  Critical Care performed:   ____________________________________________   INITIAL IMPRESSION / ASSESSMENT AND PLAN / ED COURSE  Pertinent labs & imaging results that were available during my care of the patient were reviewed by me and considered in my medical decision making (see chart for details).    CT scan shows constipation otherwise patient's labs are okay toe looks like there might be some cellulitis going on I will give her some Augmentin for that and have her take an over-the-counter laxative as well. We'll have her follow-up with her doctor.   ____________________________________________   FINAL CLINICAL IMPRESSION(S)  / ED DIAGNOSES  Final diagnoses:  Abdominal pain, unspecified abdominal location  Constipation, unspecified constipation type  Cellulitis of toe of right foot      NEW MEDICATIONS STARTED DURING THIS VISIT:  New Prescriptions   AMOXICILLIN-CLAVULANATE (AUGMENTIN) 875-125 MG TABLET    Take 1 tablet by mouth 2 (two) times daily.     Note:  This document was prepared using Dragon voice recognition software and may include unintentional dictation errors.    Arnaldo Natal, MD 06/22/17 2024

## 2017-06-22 NOTE — ED Notes (Signed)
Pt states she will call cab company for transportation.

## 2017-06-22 NOTE — Discharge Instructions (Signed)
Please drink plenty of fluids. Please take an over-the-counter laxative. Use the Augmentin 1 pill twice a day with food for the toe. Please follow-up with your regular doctor to check on the telephone make sure it's getting better return here if it's getting worse or if he get a fever or worse pain vomiting or feeling sicker. The CT scan only shows constipation at the present time. That should be treated well with the fluids and the over-the-counter laxative.

## 2017-06-22 NOTE — ED Notes (Signed)
Pt reports minimal pain relief from fentanyl.

## 2017-06-22 NOTE — ED Notes (Signed)
[employer] called for pt. Pt verbalizes understanding of this.

## 2017-06-22 NOTE — ED Notes (Signed)
Pt unable to urinate at this time. Given specimen cup for when is able to void and sent back out to lobby.

## 2017-06-22 NOTE — ED Notes (Signed)
Per EDP, discontinue urine collection.

## 2017-06-24 ENCOUNTER — Encounter: Payer: Self-pay | Admitting: Gastroenterology

## 2017-06-24 ENCOUNTER — Ambulatory Visit (INDEPENDENT_AMBULATORY_CARE_PROVIDER_SITE_OTHER): Payer: PPO | Admitting: Gastroenterology

## 2017-06-24 VITALS — BP 102/66 | HR 70 | Temp 98.1°F | Ht 68.0 in | Wt 188.8 lb

## 2017-06-24 DIAGNOSIS — K5900 Constipation, unspecified: Secondary | ICD-10-CM | POA: Diagnosis not present

## 2017-06-24 DIAGNOSIS — K5732 Diverticulitis of large intestine without perforation or abscess without bleeding: Secondary | ICD-10-CM | POA: Diagnosis not present

## 2017-06-24 MED ORDER — POLYETHYLENE GLYCOL 3350 17 GM/SCOOP PO POWD: ORAL | 3 refills | Status: DC

## 2017-06-24 NOTE — Progress Notes (Signed)
Wyline Mood MD, MRCP(U.K) [address]  [address]  [place], [place] [address]  Main: [phone]  Fax: [phone]   Gastroenterology Consultation  Referring Provider:     Garlon Hatchet, MD Primary Care Physician:  Garlon Hatchet, MD Primary Gastroenterologist:  Dr. Wyline Mood  Reason for Consultation:     Abdominal pain         HPI:   Jessica Donovan is a 71 y.o. y/o female referred for consultation & management  by Dr. Garlon Hatchet, MD.    She was recently on 06/22/17 seen at the ER for abdominal pain.  Ct abdomen on 06/22/17 showed large stool burden throughout the colon . Previously seen sigmoid colitis had resolved. Ct abdomen on 05/21/17 showed circumferential wall thicken of the sigmoid colon and rectum suggestive of colitis.  Large qty of stool throughout the colon . Hb 13.3 grams:Lipase normal. Subsequently she has been referred to see me.   She says that she has had a recent UTI, antibiotics and at the same time had abdominal pain in the center of her abdomen . Subsequently she had a CT scan of her abdomen which is described above. She was treated for diverticulitis with antibiotics. Presently she says that abdominal pain is much better. She has a bowel movement once every 3 days, not hard, medium sized, no blood. She has been this frequent for many years. No family history of colon cancer. She has never had a colonoscopy . She suffers from chronic back pain and takes morphine.   Past Medical History:  Diagnosis Date  . Anxiety   . Chronic back pain   . Collagen vascular disease (HCC)   . Coronary artery disease    a. s/p PCI/DES to dRCA & mLCx in 2014 with repeat LHC in 10/2015 showing patent stents  . Diabetes mellitus without complication (HCC)   . Hypertension   . Low back pain   . Myocardial infarction (HCC)   . Osteomyelitis of toe (HCC) 01/23/2016  . Stroke [facility])     Past Surgical History:  Procedure Laterality Date  . AMPUTATION TOE Left  11/10/2015   Procedure: AMPUTATION TOE;  Surgeon: Linus Galas, MD;  Location: [facility] ORS;  Service: Podiatry;  Laterality: Left;  . AMPUTATION TOE Left 01/24/2016   Procedure: AMPUTATION TOE (2nd mpj);  Surgeon: Linus Galas, DPM;  Location: [facility] ORS;  Service: Podiatry;  Laterality: Left;  . CARDIAC CATHETERIZATION N/A 11/12/2015   Procedure: Left Heart Cath and Coronary Angiography;  Surgeon: Marcina Millard, MD;  Location: [facility] INVASIVE CV LAB;  Service: Cardiovascular;  Laterality: N/A;  . JOINT REPLACEMENT    . TOE AMPUTATION      Prior to Admission medications   Medication Sig Start Date End Date Taking? Authorizing Provider  nitrofurantoin, macrocrystal-monohydrate, (MACROBID) 100 MG capsule Take by mouth. 03/17/16  Yes [provider]  solifenacin (VESICARE) 10 MG tablet Vesicare 10 mg tablet  take 1 tablet by mouth  every day 03/29/15  Yes [provider]  tolterodine (DETROL LA) 4 MG 24 hr capsule Take by mouth. 03/17/16  Yes [provider]  acetaminophen (TYLENOL) 500 MG tablet Take 1,000 mg by mouth every 4 (four) hours as needed for mild pain or headache.     [provider]  ALPRAZolam Prudy Feeler) 1 MG tablet Take 1 mg by mouth 3 (three) times daily.  02/08/17   [provider]  amoxicillin-clavulanate (AUGMENTIN) 875-125 MG tablet Take 1 tablet by mouth 2 (two) times daily.  06/22/17 07/02/17  Arnaldo Natal, MD  aspirin EC 81 MG tablet Take 81 mg by mouth daily.    [provider]  atorvastatin (LIPITOR) 20 MG tablet Take 20 mg by mouth at bedtime.     [provider]  Biotin 5 MG CAPS Take 5 mg by mouth daily.     [provider]  celecoxib (CELEBREX) 200 MG capsule Take 200 mg by mouth daily.     [provider]  cyanocobalamin 1000 MCG tablet cyanocobalamin (vit B-12) 1,000 mcg tablet  take 1 tablet by oral route  every day    [provider]  cyclobenzaprine (FLEXERIL) 10 MG tablet Take 1 tablet  (10 mg total) by mouth 3 (three) times daily as needed for muscle spasms. 01/27/16   Enid Baas, MD  diphenoxylate-atropine (LOMOTIL) 2.5-0.025 MG tablet Take 1 tablet by mouth every 4 (four) hours as needed for diarrhea or loose stools.     [provider]  ferrous sulfate 325 (65 FE) MG tablet Take 325 mg by mouth daily with breakfast.    [provider]  ferrous sulfate 325 (65 FE) MG tablet ferrous sulfate 325 mg (65 mg iron) tablet  TAKE ONE (1) TABLET BY MOUTH EVERY DAY    [provider]  fesoterodine (TOVIAZ) 8 MG TB24 tablet Take 8 mg by mouth at bedtime.     [provider]  furosemide (LASIX) 20 MG tablet Take 1 tablet (20 mg total) by mouth daily as needed. Patient taking differently: Take 20 mg by mouth daily as needed for fluid.  03/01/17   [name], MD  insulin aspart (NOVOLOG) 100 UNIT/ML injection Inject 3-15 Units into the skin 3 (three) times daily with meals as needed for high blood sugar. Pt uses as needed per sliding scale:    Less than 140:  0 units  140-180:  3 units 181-220:  4 units 221- 260:  6 units 261- 320:  8 units 321-360:  10 units 361-400:  12 units Greater than 400:  15 units    [provider]  Insulin Glargine (TOUJEO SOLOSTAR) 300 UNIT/ML SOPN Inject 30 Units into the skin daily. 03/01/17   [name], MD  isosorbide mononitrate (IMDUR) 30 MG 24 hr tablet Take 30 mg by mouth daily.     [provider]  liothyronine (CYTOMEL) 25 MCG tablet Take 25 mcg by mouth daily.    [provider]  lisinopril (PRINIVIL,ZESTRIL) 20 MG tablet Take 20 mg by mouth daily.    [provider]  metoprolol tartrate (LOPRESSOR) 25 MG tablet Take 25 mg by mouth 2 (two) times daily. 01/28/17   [provider]  mirtazapine (REMERON) 15 MG tablet Take 15 mg by mouth at bedtime.    [provider]  morphine (MS CONTIN) 15 MG 12 hr tablet Take 1 tablet (15 mg total) by mouth every 12  (twelve) hours. 05/23/17   Auburn Bilberry, MD  morphine (MSIR) 15 MG tablet Take 1 tablet (15 mg total) by mouth 3 (three) times daily as needed for severe pain. 05/23/17   Auburn Bilberry, MD  Multiple Vitamin (MULTIVITAMIN WITH MINERALS) TABS tablet Take 1 tablet by mouth daily.    [provider]  mupirocin ointment (BACTROBAN) 2 % mupirocin 2 % topical ointment    [provider]  nitrofurantoin (MACRODANTIN) 100 MG capsule Take 100 mg by mouth daily.    [provider]  nitroGLYCERIN (NITROSTAT) 0.4 MG SL tablet Place 0.4  mg under the tongue every 5 (five) minutes as needed for chest pain. Reported on 04/16/2016    [provider]  omeprazole (PRILOSEC) 20 MG capsule Take 20 mg by mouth daily.    [provider]  Omeprazole 20 MG TBEC omeprazole 20 mg capsule,delayed release    [provider]  Polyethylene Glycol 3350 (MIRALAX PO) Miralax 17 gram oral powder packet  take 1 packet by oral route  every day mixed with 8 oz. water, juice, soda, coffee or tea    [provider]  pregabalin (LYRICA) 150 MG capsule Take 1 capsule (150 mg total) by mouth 2 (two) times daily. Patient taking differently: Take 150 mg by mouth 3 (three) times daily.  01/19/17   Milagros LollSudini, Srikar, MD  promethazine (PHENERGAN) 25 MG tablet Take 25 mg by mouth every 6 (six) hours as needed for nausea.     [provider]  ticagrelor (BRILINTA) 90 MG TABS tablet Brilinta 90 mg tablet    [provider]  traZODone (DESYREL) 100 MG tablet Take 100 mg by mouth at bedtime. 01/28/17   [provider]  venlafaxine XR (EFFEXOR-XR) 75 MG 24 hr capsule Take 225 mg by mouth daily.    [provider]  vitamin B-12 (CYANOCOBALAMIN) 1000 MCG tablet Take 1,000 mcg by mouth daily.    [provider]  Vitamin D, Ergocalciferol, (DRISDOL) 50000 units CAPS capsule Take 50,000 Units by mouth every Sunday.     [provider]    Family  History  Problem Relation Age of Onset  . CAD Mother   . CAD Father      Social History  Substance Use Topics  . Smoking status: Never Smoker  . Smokeless tobacco: Never Used  . Alcohol use No    Allergies as of 06/24/2017 - Review Complete 06/22/2017  Allergen Reaction Noted  . Codeine Nausea And Vomiting 11/07/2015  . Flu virus vaccine  03/22/2013  . Influenza vaccines Other (See Comments) 11/07/2015  . Methadone Hives and Itching 11/07/2015  . Oxycodone-acetaminophen Nausea And Vomiting 11/07/2015  . Percocet [oxycodone-acetaminophen] Nausea And Vomiting 11/07/2015  . Tetanus antitoxin  03/16/2012  . Tetanus toxoids Swelling and Other (See Comments) 03/22/2013  . Penicillin g Rash 11/06/2015  . Tetracyclines & related Rash 11/07/2015    Review of Systems:    All systems reviewed and negative except where noted in HPI.   Physical Exam:  BP 102/66   Pulse 70   Temp 98.1 F (36.7 C) (Oral)   Ht 5\' 8"  (1.727 m)   Wt 188 lb 12.8 oz (85.6 kg)   BMI 28.71 kg/m  No LMP recorded. Patient is postmenopausal. Psych:  Alert and cooperative. Normal mood and affect.Walks with a walker  General:   Alert,  Well-developed, well-nourished, pleasant and cooperative in NAD Head:  Normocephalic and atraumatic. Eyes:  Sclera clear, no icterus.   Conjunctiva pink. Ears:  Normal auditory acuity. Nose:  No deformity, discharge, or lesions. Mouth:  No deformity or lesions,oropharynx pink & moist. Neck:  Supple; no masses or thyromegaly. Lungs:  Respirations even and unlabored.  Clear throughout to auscultation.   No wheezes, crackles, or rhonchi. No acute distress. Heart:  Regular rate and rhythm; no murmurs, clicks, rubs, or gallops. Abdomen:  Normal bowel sounds.  No bruits.  Soft, non-tender and non-distended without masses, hepatosplenomegaly or hernias noted.  No guarding or rebound tenderness.    Extremities:  No clubbing or edema.  No cyanosis. Neurologic:  Alert  and oriented x3;   grossly normal neurologically. Skin:  Intact without significant lesions or rashes. No jaundice. Lymph Nodes:  No significant cervical adenopathy. Psych:  Alert and cooperative. Normal mood and affect.  Imaging Studies: Dg Chest 2 View  Result Date: 06/09/2017 CLINICAL DATA:  Chest pain EXAM: CHEST  2 VIEW COMPARISON:  05/19/2017 FINDINGS: The heart size and mediastinal contours are within normal limits. Both lungs are clear. The visualized skeletal structures are unremarkable. IMPRESSION: No active cardiopulmonary disease. Electronically Signed   By: Signa Kell M.D.   On: 06/09/2017 12:56   Ct Head Wo Contrast  Result Date: 06/09/2017 CLINICAL DATA:  Status post fall x2 since a hospitalization earlier this month. EXAM: CT HEAD WITHOUT CONTRAST TECHNIQUE: Contiguous axial images were obtained from the base of the skull through the vertex without intravenous contrast. COMPARISON:  Head CT scan 02/23/2017 and 01/17/2017. Maxillofacial CT scan 01/17/2017. FINDINGS: Brain: There is some cortical atrophy and chronic microvascular ischemic change. No evidence of acute abnormality including hemorrhage, infarct, mass lesion, mass effect, midline shift or abnormal extra-axial fluid collection. No hydrocephalus or pneumocephalus. Vascular: Atherosclerosis noted. Skull: Intact. Sinuses/Orbits: Paranasal sinuses are clear. 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 Other: None. IMPRESSION: No acute abnormality. Atrophy and chronic microvascular ischemic change. Atherosclerosis. 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 Electronically Signed   By: Drusilla Kanner M.D.   On: 06/09/2017 14:35   Ct Abdomen Pelvis W Contrast  Result Date: 06/22/2017 CLINICAL DATA:  Abdominal pain EXAM: CT ABDOMEN AND PELVIS  WITH CONTRAST TECHNIQUE: Multidetector CT imaging of the abdomen and pelvis was performed using the standard protocol following bolus administration of intravenous contrast. CONTRAST:  ISOVUE-300 IOPAMIDOL (ISOVUE-300) INJECTION 61% COMPARISON:  05/21/2017 FINDINGS: Lower chest: Cardiomegaly. Coronary artery calcifications. Linear areas of atelectasis in the lung bases. No effusions. Hepatobiliary: No focal liver abnormality is seen. Status post cholecystectomy. No biliary dilatation. Pancreas: No focal abnormality or ductal dilatation. Spleen: No focal abnormality.  Normal size. Adrenals/Urinary Tract: No adrenal abnormality. No focal renal abnormality. No stones or hydronephrosis. Urinary bladder is unremarkable. Stomach/Bowel: Large stool burden throughout the colon. Previously seen stranding around the sigmoid colon has resolved. No areas of wall abnormality or surrounding inflammation. Stomach and small bowel decompressed, unremarkable. Vascular/Lymphatic: Aortic and iliac calcifications. No aneurysm or adenopathy. Reproductive: Prior hysterectomy.  No adnexal masses. Other: No free fluid or free air. Musculoskeletal: Diffuse degenerative disc disease throughout the lumbar spine. No acute bony abnormality. IMPRESSION: Large stool burden throughout the colon. Previously seen colitis changes in the sigmoid colon have resolved. No focal area of inflammation currently present. No acute findings in the abdomen or pelvis. Coronary artery disease, aortic atherosclerosis. Electronically Signed   By: Charlett Nose M.D.   On: 06/22/2017 19:59   Nm Myocar Multi W/spect W/wall Motion / Ef  Result Date: 06/10/2017 Pharmacological myocardial perfusion imaging study with no significant  ischemia Normal wall motion, EF estimated at 90% No EKG changes concerning for ischemia at peak stress or in recovery. Low risk scan Signed, Dossie Arbour, MD, Ph.D [facility]   Dg Foot Complete Right  Result Date:  06/22/2017 CLINICAL DATA:  Acute right foot pain, greatest in second toe following injury. Initial encounter. EXAM: RIGHT FOOT COMPLETE - 3+ VIEW COMPARISON:  05/19/2017 ankle radiographs FINDINGS: Amputation of the great toe noted.  No acute fracture, subluxation or dislocation identified. Remote fracture of the distal fifth metatarsal identified. The Lisfranc joints are intact. A small calcaneal spur is present. IMPRESSION: No evidence of acute bony abnormality. Electronically Signed   By: Harmon Pier M.D.   On: 06/22/2017 18:31    Assessment and Plan:   Jessica Donovan is a 71 y.o. y/o female has been referred for abdominal pain. Her history suggestive that she has had a recent episode of diverticulitis. She also sufferrs from chronic constipation likely secondary to Opiod use for back pain. No red flag signs. Denies a prior colonoscopy.    I have discussed alternative options including CT colonography , colon pill cam , risks & benefits,  which include, but are not limited to, bleeding, infection, perforation,respiratory complication & drug reaction.  The patient agrees with this plan & written consent will be obtained.    Plan  1. Colonoscopy - She isn on a blood thinner and will need holding instructions from cardiology.  2. PRN miralax   Follow up as needed   Dr Wyline Mood MD,MRCP(U.K)

## 2017-06-25 ENCOUNTER — Other Ambulatory Visit: Payer: Self-pay | Admitting: *Deleted

## 2017-06-25 ENCOUNTER — Ambulatory Visit: Payer: PPO | Admitting: *Deleted

## 2017-06-25 ENCOUNTER — Telehealth: Payer: Self-pay

## 2017-06-25 ENCOUNTER — Other Ambulatory Visit: Payer: Self-pay

## 2017-06-25 DIAGNOSIS — K5900 Constipation, unspecified: Secondary | ICD-10-CM

## 2017-06-25 DIAGNOSIS — K5732 Diverticulitis of large intestine without perforation or abscess without bleeding: Secondary | ICD-10-CM

## 2017-06-25 NOTE — Telephone Encounter (Signed)
Gastroenterology Pre-Procedure Review  Request Date: 07/20/17 Requesting Physician: Dr. Tobi BastosAnna  PATIENT REVIEW QUESTIONS: The patient responded to the following health history questions as indicated:    1. Are you having any GI issues? yes (Diverticulitis,Constipation) 2. Do you have a personal history of Polyps? no 3. Do you have a family history of Colon Cancer or Polyps? no 4. Diabetes Mellitus? yes (Type 2) 5. Joint replacements in the past 12 months?no 6. Major health problems in the past 3 months?no 7. Any artificial heart valves, MVP, or defibrillator?no    MEDICATIONS & ALLERGIES:    Patient reports the following regarding taking any anticoagulation/antiplatelet therapy:   Plavix, Coumadin, Eliquis, Xarelto, Lovenox, Pradaxa, Brilinta, or Effient? no Aspirin? yes (81 mg daily)  Patient confirms/reports the following medications:  Current Outpatient Prescriptions  Medication Sig Dispense Refill  . acetaminophen (TYLENOL) 500 MG tablet Take 1,000 mg by mouth every 4 (four) hours as needed for mild pain or headache.     . ALPRAZolam (XANAX) 1 MG tablet Take 1 mg by mouth 3 (three) times daily.     [name]. amoxicillin-clavulanate (AUGMENTIN) 875-125 MG tablet Take 1 tablet by mouth 2 (two) times daily. (Patient not taking: Reported on 06/24/2017) 20 tablet 0  . aspirin EC 81 MG tablet Take 81 mg by mouth daily.    [name]. atorvastatin (LIPITOR) 20 MG tablet Take 20 mg by mouth at bedtime.     . Biotin 5 MG CAPS Take 5 mg by mouth daily.     . celecoxib (CELEBREX) 200 MG capsule Take 200 mg by mouth daily.     . cyanocobalamin 1000 MCG tablet cyanocobalamin (vit B-12) 1,000 mcg tablet  take 1 tablet by oral route  every day    . cyclobenzaprine (FLEXERIL) 10 MG tablet Take 1 tablet (10 mg total) by mouth 3 (three) times daily as needed for muscle spasms. 20 tablet 0  . diphenoxylate-atropine (LOMOTIL) 2.5-0.025 MG tablet Take 1 tablet by mouth every 4 (four) hours as needed for diarrhea or loose  stools.     . ferrous sulfate 325 (65 FE) MG tablet Take 325 mg by mouth daily with breakfast.    . ferrous sulfate 325 (65 FE) MG tablet ferrous sulfate 325 mg (65 mg iron) tablet  TAKE ONE (1) TABLET BY MOUTH EVERY DAY    . fesoterodine (TOVIAZ) 8 MG TB24 tablet Take 8 mg by mouth at bedtime.     . furosemide (LASIX) 20 MG tablet Take 1 tablet (20 mg total) by mouth daily as needed. (Patient taking differently: Take 20 mg by mouth daily as needed for fluid. ) 30 tablet 0  . insulin aspart (NOVOLOG) 100 UNIT/ML injection Inject 3-15 Units into the skin 3 (three) times daily with meals as needed for high blood sugar. Pt uses as needed per sliding scale:    Less than 140:  0 units  140-180:  3 units 181-220:  4 units 221- 260:  6 units 261- 320:  8 units 321-360:  10 units 361-400:  12 units Greater than 400:  15 units    . Insulin Glargine (TOUJEO SOLOSTAR) 300 UNIT/ML SOPN Inject 30 Units into the skin daily. 1 pen 0  . isosorbide mononitrate (IMDUR) 30 MG 24 hr tablet Take 30 mg by mouth daily.     [name]. liothyronine (CYTOMEL) 25 MCG tablet Take 25 mcg by mouth daily.    [name]. lisinopril (PRINIVIL,ZESTRIL) 20 MG tablet Take 20 mg by mouth daily.    .[name]  metoprolol tartrate (LOPRESSOR) 25 MG tablet Take 25 mg by mouth 2 (two) times daily.    . mirtazapine (REMERON) 15 MG tablet Take 15 mg by mouth at bedtime.    [name] morphine (MS CONTIN) 15 MG 12 hr tablet Take 1 tablet (15 mg total) by mouth every 12 (twelve) hours. 30 tablet 0  . morphine (MSIR) 15 MG tablet Take 1 tablet (15 mg total) by mouth 3 (three) times daily as needed for severe pain. 30 tablet 0  . Multiple Vitamin (MULTIVITAMIN WITH MINERALS) TABS tablet Take 1 tablet by mouth daily.    . mupirocin ointment (BACTROBAN) 2 % mupirocin 2 % topical ointment    . nitrofurantoin (MACRODANTIN) 100 MG capsule Take 100 mg by mouth daily.    . nitrofurantoin, macrocrystal-monohydrate, (MACROBID) 100 MG capsule Take by mouth.    . nitroGLYCERIN  (NITROSTAT) 0.4 MG SL tablet Place 0.4 mg under the tongue every 5 (five) minutes as needed for chest pain. Reported on 04/16/2016    . omeprazole (PRILOSEC) 20 MG capsule Take 20 mg by mouth daily.    . Omeprazole 20 MG TBEC omeprazole 20 mg capsule,delayed release    . Polyethylene Glycol 3350 (MIRALAX PO) Miralax 17 gram oral powder packet  take 1 packet by oral route  every day mixed with 8 oz. water, juice, soda, coffee or tea    . polyethylene glycol powder (GLYCOLAX/MIRALAX) powder 17 grams as needed each time for constpation 255 g 3  . pregabalin (LYRICA) 150 MG capsule Take 1 capsule (150 mg total) by mouth 2 (two) times daily. (Patient taking differently: Take 150 mg by mouth 3 (three) times daily. )    . promethazine (PHENERGAN) 25 MG tablet Take 25 mg by mouth every 6 (six) hours as needed for nausea.     . solifenacin (VESICARE) 10 MG tablet Vesicare 10 mg tablet  take 1 tablet by mouth  every day    . ticagrelor (BRILINTA) 90 MG TABS tablet Brilinta 90 mg tablet    . tolterodine (DETROL LA) 4 MG 24 hr capsule Take by mouth.    . traZODone (DESYREL) 100 MG tablet Take 100 mg by mouth at bedtime.    [name] venlafaxine XR (EFFEXOR-XR) 75 MG 24 hr capsule Take 225 mg by mouth daily.    . vitamin B-12 (CYANOCOBALAMIN) 1000 MCG tablet Take 1,000 mcg by mouth daily.    . Vitamin D, Ergocalciferol, (DRISDOL) 50000 units CAPS capsule Take 50,000 Units by mouth every Sunday.      No current facility-administered medications for this visit.     Patient confirms/reports the following allergies:  Allergies  Allergen Reactions  . Codeine Nausea And Vomiting  . Flu Virus Vaccine     Other reaction(s): Other (See Comments) Passed out for 12 days  . Influenza Vaccines Other (See Comments)    Reaction:  Caused pt to pass out   . Methadone Hives and Itching  . Oxycodone-Acetaminophen Nausea And Vomiting  . Percocet [Oxycodone-Acetaminophen] Nausea And Vomiting  . Tetanus Antitoxin   . Tetanus  Toxoids Swelling and Other (See Comments)    Reaction:  Swelling at injection site  . Penicillin G Rash  . Tetracyclines & Related Rash    No orders of the defined types were placed in this encounter.   AUTHORIZATION INFORMATION Primary Insurance: 1D#: Group #:  Secondary Insurance: 1D#: Group #:  SCHEDULE INFORMATION: Date: 07/20/17 Time: Location:[facility]

## 2017-06-25 NOTE — Patient Outreach (Signed)
Successful telephone encounter completed on pt, follow up on recent ED visit 06/22/17 (toe pain, abdominal pain).  RN CM also currently following pt for transition of care (recent hospitalization June 20-21, 2018 for chest pain).  Spoke with pt, HIPAA identifiers provided.  Pt reports on recent ED visit-  CT of stomach done as well as xray of toe (second/right foot), was informed infection in toe not to the bone, given a prescription for antibiotic, have not picked up yet, plan to do tomorrow.  Pt reports toe is better, wound on left ankle good/right ankle hurts.  Pt reports was told to make an appointment at wound care center- made/scheduled for 7/18.  Pt reports abdominal pain is better,if eats hurts, having smaller meals and eating only half.  Pt reports on consult with GI MD (Dr. Wyline MoodAnna Kiran) yesterday, scheduled to have colonoscopy 7/31.   Pt reports did have chest pain this am, took her second Nitroglycerin SL, easing off to which RN CM reviewed with pt no more than  3 tablets  pt voiced understanding.  Discussed with pt coworker Su HiltLaine RN CM covering for this RN CM to do follow up phone call next week.    Plan:  As discussed, pt to pick up antibiotic and start as ordered.            As discussed with pt, coworker Systems analystLaine RN CM to follow up next week telephonically.   [name] M.   Greg Eckrich RN CCM [facility]  [phone]

## 2017-06-28 ENCOUNTER — Encounter: Payer: Self-pay | Admitting: *Deleted

## 2017-06-28 ENCOUNTER — Other Ambulatory Visit: Payer: Self-pay | Admitting: *Deleted

## 2017-06-28 NOTE — Patient Outreach (Signed)
[facility] Customer [facility]Care Network [facility]) Care Management [facility] CM Telephone Outreach, Care Coordination  06/28/2017  Mellody DanceBarbara Kahler 1946-01-03 [id number]  3:05 pm: Unsuccessful telephone outreach to [name], [phone]) CSW at [facility], listed on [facility] CM written consent as contact for patient Mellody DanceBarbara Freshour, 71 y/o female [facility] for transition of care after recent hospitalization June 20-21, 2018 for chest pain.  Patient has history including, but not limited to, DM, HTN, CAD with previous MI, and CVA.    HIPAA compliant voice mail message left for patient, requesting return call back.  3:35 pm: [name] returned my call; HIPPA/ identity of patient verified.  Today, [name] reports that she is unaware that patient was supposed to be on antibiotic after ED visit 06/22/17; [name] reports that she previously handled an issue with antibiotics for patient, but that it "was weeks ago."    I explained that I contacted patient earlier this afternoon, and she seemed to think she was not supposed to be taking the antibiotic (Augmentin) prescribed post- ED visit 06/22/17.  I updated and shared with [name] details of my conversation earlier with patient, and that patient reported that he toe had burst open and was now much better.  I confirmed with [name] that it appeared that this Rx was called in to [facility] by EDP on 06/22/17.  [name] agreed to follow up with the patient's pharmacy and with the patient directly and to re-contact me for any questions or issues around this medication Rx from 06/22/17.  I provided my direct phone number to [name], should she need or wish to contact me further around this medication issue.  Plan:  Will collaborate with patient/ her designated contact as indicated around current medication issue for Augmentin  Will update patient's primary [facility] RN CM of today's successful telephone outreach to patient and her designated contact on [facility] CM written consent.  Caryl PinaLaine Mckinney  Keshawna Dix, RN, BSN, [employer] Community Care Coordinator [facility] Care Management  [phone]

## 2017-06-28 NOTE — Patient Outreach (Signed)
[facility] Customer service managerHealthCare Network [facility](THN) Care Management [facility] CM Telephone Outreach, Transition of Care day 18  06/28/2017  [name] [birth year] [id number]  Successful telephone outreach to Jessica RidgesBarabara Justen, 71 y/o female [facility] CM for transition of care after recent hospitalization June 20-21, 2018 for chest pain.  Patient has history including, but not limited to, DM, HTN, CAD with previous MI, and CVA.  HIPPA/ identity verified with patient during phone call today, and I explained that I was contacting patient for primary [facility] CM [name].  Today, patient reports that she is "doing better," although she states that she is having "some pain" ("4/10") due to chronic "sciatica."  Reports that she is to follow up with her specialist for this pain, but cannot remember exactly when this appointment is; patient reports that she sees her specialist "regularly" for ongoing issues with sciatica.  -- reports has medications and is taking as prescribed; when questioned about her recently added prescription for Augmentin for her recent toe issue which required ED visit on 06/22/17, patient stated, "there was a big mix-up with that antibiotic;" states that "it is all worked out now."  Reports that "Jessica Donovan" ([facility] written [facility]) had to call the pharmacy about the medication, and was told that the patient should not be taking this medication.  Reports that Jessica Donovan followed up, and now agreed that patient should not be taking the antibiotic that was prescribed post-ED visit 06/22/17.  Patient could not provide specific details regarding why this decision was made, but stated that she agreed with what Jessica Donovan told her in follow up when there was an issue filling the medication.  -- states recent toe issue requiring ED visit 06/22/17 "is much better now."  Reports that the area on her toe "ended up bursting open," and "all the infection drained out," adding, "after that happened, it felt so much  better."  Denies current issues with this toe, and stated several times, "it looks and feels much better now."  -- confirms that she has had occasions of chest pain, which is relieved by NTG; denies having had to take NTG over this past weekend.  Discussed signs/ symptoms MI with patient with corresponding action plan if she experiences chest pain, and patient is able to verbalize appropriate plan of action for chest pain.  -- confirmed that she has scheduled visit with wound clinic for her "ankle issue" on July 07, 2017, and scheduled colonoscopy 07/20/17; patient verbalizes that she is "afraid" to have the colonoscopy due to a friend of hers that had one and had complications leading to her death; I encouraged patient to discuss her fears with her GI provider/ PCP and patient reported that she "already has" and her doctor advised that she go ahead and have the procedure and reassured her that she needed to have it completed.  Patient stated that she plans to have the procedure, stating, "I know I need it."  Patient denies further issues, concerns, or problems today.  I confirmed that patient has my direct phone number should she need it this week while primary [facility] RN CM is out of office, the main [facility] CM office phone number, and the [facility] CM 24-hour nurse advice phone number should issues arise prior to next scheduled [facility] Community CM outreach. I shared with patient that Rose RN CM would contact patient again next week for ongoing transition of care; patient verbalized understanding and agreement with this plan.  Plan:  Will update patient's  primary THN RN CM of today's successful telephone outreach to patient  Will reach out to patient's contact on [facility] CM written consent to inquire further about status of antibiotic ordered last week during ED visit on 06/22/17  [name], RN, BSN, CCRN [facility] Coordinator [facility] Care Management  [phone]

## 2017-06-29 ENCOUNTER — Other Ambulatory Visit: Payer: Self-pay

## 2017-06-30 ENCOUNTER — Other Ambulatory Visit: Payer: Self-pay | Admitting: *Deleted

## 2017-06-30 ENCOUNTER — Encounter: Payer: Self-pay | Admitting: *Deleted

## 2017-06-30 NOTE — Patient Outreach (Addendum)
[facility] Customer service managerHealthCare Network [facility](THN) Care Management [facility] Community CM Telephone Outreach, Care Coordination 06/30/2017  Jessica DanceBarbara Gehret 19-Feb-1946 [id number]   2:45 pm: Unsuccessful telephone outreach from [name], [phone]) CSW at [facility], listed on [facility] CM written consent as contact for patient Jessica DanceBarbara Donovan, 71 y/o female [facility] CM for transition of care after recent hospitalization June 20-21, 2018 for chest pain. Patient has history including, but not limited to, DM, HTN, CAD with previous MI, and CVA.   [name] left me a voice mail message updating me that she had contacted patient and confirmed that patient was confused about my prior inquiry regarding antibiotic prescribed post-ED visit on 06/22/17 (Augmentin); according to her message, [name] said patient thought I was inquiring about another antibiotic that was prescribed "several months ago from another hospital visit."  [name] confirmed in her message that she updated patient that I was in fact inquiring about the antibiotic that was prescribed after her recent ED visit on 06/22/17, and reported that patient was unaware that an antibiotic had been ordered during ED visit 7/0/3/18, and she had now contacted her current pharmacy ([facility]) and made arrangements for the Augmentin to be delivered to her this afternoon.  [name] verbalized plans to follow up with patient about the antibiotic delivery tomorrow.  [name] also said in her message that patient continued to report that her "toe was better."  3:00 pm: I returned Anna's call; left detailed VM msg for her, asking her to re-contact me for any ongoing questions or issues around this medication Rx from ED visit 06/22/17.  I again provided my direct phone number to [name], should she need or wish to contact me further around this medication issue.  Plan:  Will collaborate with patient/ her designated contact as indicated around current medication issue for Augmentin  Will  update patient's primary [facility] RN CM of today's telephone outreach to patient's designated contact [facility] CM written consent.  Caryl PinaLaine Mckinney Emet Rafanan, RN, BSN, [employer] Community Care Coordinator [facility] Care Management  [phone]

## 2017-07-01 ENCOUNTER — Other Ambulatory Visit: Payer: Self-pay | Admitting: *Deleted

## 2017-07-01 NOTE — Patient Outreach (Signed)
[facility] Customer [facility]HealthCare Network [facility]N) Care Management [facility] CM Telephone Outreach Care Coordination  07/01/2017  Mellody DanceBarbara Zenor 01/14/1946 [id number]  2:25 pm: Unsuccessful telephone outreach from [name], [phone]) CSW [facility],listed on [facility] CM written consent as contact for patient Francoise CeoBarbara Bores,70 y/o female [facility] Community CM for transition of care after recent hospitalization June 20-21, 2018 for chest pain. Patient has history including, but not limited to, DM, HTN, CAD with previous MI, and CVA.   [name] left me a voice mail (VM) message updating me that she had re-contacted patient today in follow up from her previous conversations with patient earlier this week; today, [name] reports that patient now states that patient was unable to obtain the antibiotic (as [facility]).  [name] shared in her voice mail message that patient received follow up from her pharmacy that they did not have the order for the Augmentin that was placed on 06/22/17 post-ED visit.  [name] further said that patient had her ED discharge instructions from 06/22/17 visit, and that there was "nothing about an antibiotic" on the visit summary.  [name] requested that I take steps to have the ED "re-send" the prescription to patient's pharmacy, and to contact the patient with follow up.  3:25 pm: I returned Anna's call; left detailed VM msg for her, and confirmed that I had visualized the ED AVS which was given to the patient at the time of discharge, and that the antibiotic was included in the discharge instructions with a printed prescription for patient.   In my VM message, I explained that the ED provider would not be able to re-order the Rx, as today is now 9 days post- ED visit.  I shared with [name] that I placed a follow up call to patient this morning, requesting a call back, and that so far, I had not heard back from patient.  I shared with [name] that I would re-attempt outreach to  patient by telephone again tomorrow.  I again provided my direct phone number to [name], should she need or wish to contact me further around this ongoing medication issue.  Plan:  Will collaborate with patient/ her designated contact as indicated around current medication issue for Augmentin, and will re-attempt phone outreach to patient tomorrow to follow up.  Will update patient's primary THN RN CM of today's telephone outreach to patient's designated contact on [facility] CM written consent.  Caryl PinaLaine Mckinney Monic Engelmann, RN, BSN, [employer] Community Care Coordinator [facility] Care Management  [phone]

## 2017-07-01 NOTE — Patient Outreach (Signed)
[facility] Customer service managerHealthCare Network [facility](THN) Care Management [facility] Community CM Telephone Outreach, Transition of Care day 21  07/01/2017  Mellody DanceBarbara Gonsalez 06/02/1946 [id number]  Unsuccessful telephone outreach to Clent RidgesBarabara Logue, 71 y/o female [facility] Community CM for transition of care after recent hospitalization June 20-21, 2018 for chest pain.  Patient has history including, but not limited to, DM, HTN, CAD with previous MI, and CVA.   Call today was placed in follow up to earlier phone conversation this week with patient/ caregiver regarding recently added prescription for Augmentin for her recent toe issue which required ED visit on 06/22/17.  During earlier conversation this week, patient had verbalized confusion about this medication; her caregiver had reported to me yesterday that the confusion had been straightened out and that patient had made arrangements for this antibiotic to be delivered to her yesterday by her pharmacy.  Call today was to confirm that patient had received/ is taking antibiotic medication prescribed post-ED visit 06/22/17.  HIPAA compliant voice mail message left for patient, requesting return call back.  Plan:  Will update patient's primary [facility] RN CM of today's unsuccessful telephone outreach to patient, as well as previous outreach to patient and caregiver earlier this week.  Caryl PinaLaine Mckinney Saleemah Mollenhauer, RN, BSN, [employer] Community Care Coordinator [facility] Care Management  [phone]

## 2017-07-02 ENCOUNTER — Other Ambulatory Visit: Payer: Self-pay | Admitting: *Deleted

## 2017-07-02 ENCOUNTER — Encounter: Payer: Self-pay | Admitting: *Deleted

## 2017-07-02 NOTE — Patient Outreach (Signed)
[facility] Customer service managerHealthCare Network [facility](THN) Care Management [facility] Community CM Telephone Outreach, Transition of Care day 22  07/02/2017  Mellody DanceBarbara Reaney 06/07/1946 [id number]  Successful telephone outreach to Clent RidgesBarabara Taul, 71 y/o female [facility] CM for transition of care after recent hospitalization June 20-21, 2018 for chest pain.  Patient has history including, but not limited to, DM, HTN, CAD with previous MI, and CVA.  HIPPA/ identity verified with patient during phone call today, and I explained that I was contacting patient in follow up to my previous conversation with her earlier this week regarding her ED visit on 06/22/17, where she was prescribed antibiotics for toe issue.    Today, patient continues to report that she is "doing better," and again states that her recent toe issue requiring ED visit 06/22/17 "is still doing fine."  Reports that the area on her toe that "burst open" after ED visit 06/22/17 "is draining a little bit, but not bad."  States that the toe is not swollen, red, or painful.  Again denies current issues with this toe, and stated several times, "it looks and feels much better now, although" she reports that "it is still too sore for me to wear a shoe; a shoe irritates the area."  Patient reports that she is keeping toe clean, dry, applying antibiotic ointment to it, and keeping a band-aid on it, which she changes twice daily.  Encouraged patient to continue taking care of toe as she has been, and discussed signs/ symptoms that would necessitate her to seek urgent treatment over the weekend, including increased swelling, redness, pain in toe, as well development of a fever or general malaise.  Patient verbalizes understanding and agreement with this plan.  Patient said several times during our phone call today that the "ER never told me they were prescribing me antibiotics," and again stated to me (as she had to [name] earlier this week) that her discharge instructions from ER  visit 06/22/17 "didn't have anything on it about taking a new antibiotic;"  I explained that the discharge papers that I could view from the EMR had the antibiotic listed on it, but patient insisted that she was never told by ED staff to start antibiotic, and that it was not on her discharge papers; I apologized for any confusion around this matter, and explained that due to the order being 3810 days old, the ED provider would not re-order the medication without first re-evaluating the toe; patient verbalized understanding and agreement and again stated that she believes her toe is better.  Patient also confirms that she has a new patient appointment at the wound clinic next Thursday July 08, 2017, and I encouraged her to have the wound care provider evaluate her toe wound, and she agreed to do so.  Patient denies further issues, concerns, or problems today. I confirmed that patient hasprimary THN RN CM direct phone number, the main [facility] CM office phone number, and the [facility] CM 24-hour nurse advice phone number should issues arise prior to next scheduled [facility] Community CM outreach. I shared with patient that Rose RN CM would contact patient again next week for ongoing transition of care; patient verbalized understanding and agreement with this plan.  Plan:  Will update patient's primary [facility] RN CM of today's successful telephone outreach to patient, as well as previous outreach to patient and caregiver earlier this week.  Caryl PinaLaine Mckinney Tousey, RN, BSN, [employer] Community Care Coordinator [facility] Care Management  [phone]

## 2017-07-06 ENCOUNTER — Other Ambulatory Visit: Payer: Self-pay | Admitting: *Deleted

## 2017-07-06 NOTE — Patient Outreach (Signed)
Successful telephone encounter to pt, ongoing follow up for transition of care, recent hospitalization June 20-21, 2018 for chest pain.   Spoke with pt, HIPAA identifiers provided.  Pt reports toe on right foot is looking better, left ankle wound healing with a little drainage (no feeling), right ankle hurts/draining worse.  Pt reports changing dressings on ankles daily, keeping clean to follow up at wound care center 07/07/17, transportation provided by [facility]. Pt reports fourth and fifth toe on right foot are wanting to turn under.   Pt reports on upcoming colonoscopy 7/31,has transportation- was told need to have someone stay with her 24 hours after, having problems arranging that as cannot afford to pay someone to stay.  RN CM discussed with pt importance of safety after having colonoscopy, risk for falls, need to have someone stay to which pt voiced understanding.     Plan:  As discussed with pt, plan to follow up again next week telephonically (final transition of care call).   [name] M.   Petros Ahart RN CCM [facility] Care Management  [phone]

## 2017-07-08 ENCOUNTER — Encounter: Payer: PPO | Attending: Surgery | Admitting: Surgery

## 2017-07-08 DIAGNOSIS — G4730 Sleep apnea, unspecified: Secondary | ICD-10-CM | POA: Insufficient documentation

## 2017-07-08 DIAGNOSIS — M549 Dorsalgia, unspecified: Secondary | ICD-10-CM | POA: Diagnosis not present

## 2017-07-08 DIAGNOSIS — E1140 Type 2 diabetes mellitus with diabetic neuropathy, unspecified: Secondary | ICD-10-CM | POA: Diagnosis not present

## 2017-07-08 DIAGNOSIS — E11621 Type 2 diabetes mellitus with foot ulcer: Secondary | ICD-10-CM | POA: Diagnosis not present

## 2017-07-08 DIAGNOSIS — L97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 layer exposed: Secondary | ICD-10-CM | POA: Insufficient documentation

## 2017-07-08 DIAGNOSIS — L97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 left ankle with fat layer exposed: Secondary | ICD-10-CM | POA: Diagnosis not present

## 2017-07-08 DIAGNOSIS — Z885 Allergy status to narcotic agent status: Secondary | ICD-10-CM | POA: Insufficient documentation

## 2017-07-08 DIAGNOSIS — Z89422 Acquired absence of other left toe(s): Secondary | ICD-10-CM | POA: Insufficient documentation

## 2017-07-08 DIAGNOSIS — G8929 Other chronic pain: Secondary | ICD-10-CM | POA: Insufficient documentation

## 2017-07-08 DIAGNOSIS — Z79899 Other long term (current) drug therapy: Secondary | ICD-10-CM | POA: Diagnosis not present

## 2017-07-08 DIAGNOSIS — Z7982 Long term (current) use of aspirin: Secondary | ICD-10-CM | POA: Insufficient documentation

## 2017-07-08 DIAGNOSIS — F419 Anxiety disorder, unspecified: Secondary | ICD-10-CM | POA: Diagnosis not present

## 2017-07-08 DIAGNOSIS — Z888 Allergy status to other drugs, medicaments and biological substances status: Secondary | ICD-10-CM | POA: Insufficient documentation

## 2017-07-08 DIAGNOSIS — I2510 Atherosclerotic heart disease of native coronary artery without angina pectoris: Secondary | ICD-10-CM | POA: Diagnosis not present

## 2017-07-08 DIAGNOSIS — M1990 Unspecified osteoarthritis, unspecified site: Secondary | ICD-10-CM | POA: Insufficient documentation

## 2017-07-08 DIAGNOSIS — Z881 Allergy status to other antibiotic agents status: Secondary | ICD-10-CM | POA: Insufficient documentation

## 2017-07-08 DIAGNOSIS — S91104A Unspecified open wound of right lesser toe(s) without damage to nail, initial encounter: Secondary | ICD-10-CM | POA: Diagnosis not present

## 2017-07-08 DIAGNOSIS — Z794 Long term (current) use of insulin: Secondary | ICD-10-CM | POA: Insufficient documentation

## 2017-07-08 DIAGNOSIS — I10 Essential (primary) hypertension: Secondary | ICD-10-CM | POA: Insufficient documentation

## 2017-07-08 DIAGNOSIS — E11622 Type 2 diabetes mellitus with other skin ulcer: Secondary | ICD-10-CM | POA: Diagnosis not present

## 2017-07-09 NOTE — Progress Notes (Signed)
Jessica Donovan ([id number]) Visit Report for 07/08/2017 Allergy List Details Patient Name: Jessica Donovan Date of Service: 07/08/2017 12:45 PM Medical Record Number: [id number] Patient Account Number: [id number] Date of Birth/Sex: [birth year] (71 y.o. Female) Treating RN: Clover Mealy, RN, BSN, [employer] Primary Care Dalia Jollie: Garlon Hatchet Other Clinician: Referring Aissata Wilmore: Dorothea Glassman Treating Ousman Dise/Extender: Rudene Re in Treatment: 0 Allergies Active Allergies codeine methadone influenza vaccines percoset tetanus toxoids tetracyclines Allergy Notes Electronic Signature(s) Signed: 07/08/2017 2:56:30 PM By: Elpidio Eric BSN, RN Entered By: Elpidio Eric on 07/08/2017 13:00:53 Jessica Donovan ([id number]) -------------------------------------------------------------------------------- Arrival Information Details Patient Name: Jessica Donovan Date of Service: 07/08/2017 12:45 PM Medical Record Number: [id number] Patient Account Number: [id number] Date of Birth/Sex: 12/28/1945 (70 y.o. Female) Treating RN: Clover Mealy, RN, BSN, [employer] Primary Care Karey Suthers: Garlon Hatchet Other Clinician: Referring Haddy Mullinax: Dorothea Glassman Treating Enrrique Mierzwa/Extender: Rudene Re in Treatment: 0 Visit Information Patient Arrived: Cloyde Reams Time: 12:52 Accompanied By: walker Transfer Assistance: None Patient Identification Verified: Yes Secondary Verification Process Yes Completed: Patient Requires Transmission- No Based Precautions: Patient Has Alerts: Yes Patient Alerts: Patient on Blood Thinner ASA Electronic Signature(s) Signed: 07/08/2017 2:56:30 PM By: Elpidio Eric BSN, RN Entered By: Elpidio Eric on 07/08/2017 12:55:39 Jessica Donovan ([id number]) -------------------------------------------------------------------------------- Clinic Level of Care Assessment Details Patient Name: Jessica Donovan Date of Service: 07/08/2017 12:45 PM Medical Record Number: [id number] Patient  Account Number: [id number] Date of Birth/Sex: Apr 24, 1946 (71 y.o. Female) Treating RN: Afful, RN, BSN, [employer] Primary Care Eldrick Penick: Garlon Hatchet Other Clinician: Referring Evanne Matsunaga: Dorothea Glassman Treating Jsiah Menta/Extender: Rudene Re in Treatment: 0 Clinic Level of Care Assessment Items TOOL 1 Quantity Score []  - Use when EandM and Procedure is performed on INITIAL visit 0 ASSESSMENTS - Nursing Assessment / Reassessment X - General Physical Exam (combine w/ comprehensive assessment (listed just 1 20 below) when performed on new pt. evals) X - Comprehensive Assessment (HX, ROS, Risk Assessments, Wounds Hx, etc.) 1 25 ASSESSMENTS - Wound and Skin Assessment / Reassessment []  - Dermatologic / Skin Assessment (not related to wound area) 0 ASSESSMENTS - Ostomy and/or Continence Assessment and Care []  - Incontinence Assessment and Management 0 []  - Ostomy Care Assessment and Management (repouching, etc.) 0 PROCESS - Coordination of Care X - Simple Patient / Family Education for ongoing care 1 15 []  - Complex (extensive) Patient / Family Education for ongoing care 0 X - Staff obtains Consents, Records, Test Results / Process Orders 1 10 []  - Staff telephones HHA, Nursing Homes / Clarify orders / etc 0 []  - Routine Transfer to another Facility (non-emergent condition) 0 []  - Routine Hospital Admission (non-emergent condition) 0 X - New Admissions / Manufacturing engineer / Ordering NPWT, Apligraf, etc. 1 15 []  - Emergency Hospital Admission (emergent condition) 0 PROCESS - Special Needs []  - Pediatric / Minor Patient Management 0 []  - Isolation Patient Management 0 Plato, Xaria ([id number]) []  - Hearing / Language / Visual special needs 0 []  - Assessment of Community assistance (transportation, D/C planning, etc.) 0 []  - Additional assistance / Altered mentation 0 []  - Support Surface(s) Assessment (bed, cushion, seat, etc.) 0 INTERVENTIONS - Miscellaneous []  - External  ear exam 0 []  - Patient Transfer (multiple staff / Nurse, adult / Similar devices) 0 []  - Simple Staple / Suture removal (25 or less) 0 []  - Complex Staple / Suture removal (26 or more) 0 []  - Hypo/Hyperglycemic Management (do not check if billed separately) 0 X - Ankle / Brachial Index (ABI) - do not check if billed separately 1  15 Has the patient been seen at the hospital within the last three years: Yes Total Score: 100 Level Of Care: New/Established - Level 3 Electronic Signature(s) Signed: 07/08/2017 2:56:30 PM By: Elpidio EricAfful, Rita BSN, RN Entered By: Elpidio EricAfful, Rita on 07/08/2017 13:58:18 Jessica DanceGLASS, Nanette ([id number]) -------------------------------------------------------------------------------- Encounter Discharge Information Details Patient Name: Jessica Donovan Date of Service: 07/08/2017 12:45 PM Medical Record Number: [id number] Patient Account Number: [id number] Date of Birth/Sex: 07-Nov-1946 (70 y.o. Female) Treating RN: Clover MealyAfful, RN, BSN, [employer] Primary Care Lenyx Boody: Garlon HatchetMCCONVILLE, ROBERT Other Clinician: Referring Ranyia Witting: Dorothea GlassmanMALINDA, PAUL Treating Dakai Braithwaite/Extender: Rudene ReBritto, Errol Weeks in Treatment: 0 Encounter Discharge Information Items Discharge Pain Level: 0 Discharge Condition: Stable Ambulatory Status: Walker Discharge Destination: Home Transportation: Private Auto Accompanied By: self Schedule Follow-up Appointment: No Medication Reconciliation completed No and provided to Patient/Care Kade Demicco: Provided on Clinical Summary of Care: 07/08/2017 Form Type Recipient Paper Patient BG Electronic Signature(s) Signed: 07/08/2017 2:08:07 PM By: Gwenlyn PerkingMoore, Shelia Entered By: Gwenlyn PerkingMoore, Shelia on 07/08/2017 14:08:06 Jessica Donovan ([id number]) -------------------------------------------------------------------------------- Lower Extremity Assessment Details Patient Name: Jessica DanceGLASS, Makiyla Date of Service: 07/08/2017 12:45 PM Medical Record Number: [id number] Patient Account Number:  [id number] Date of Birth/Sex: 07-Nov-1946 (71 y.o. Female) Treating RN: Afful, RN, BSN, [employer] Primary Care Jermale Crass: Garlon HatchetMCCONVILLE, ROBERT Other Clinician: Referring Mayes Sangiovanni: Dorothea GlassmanMALINDA, PAUL Treating Kiely Cousar/Extender: Rudene ReBritto, Errol Weeks in Treatment: 0 Vascular Assessment Claudication: Claudication Assessment [Left:None] [Right:None] Pulses: Dorsalis Pedis Palpable: [Left:Yes] [Right:Yes] Posterior Tibial Extremity colors, hair growth, and conditions: Extremity Color: [Left:Normal] [Right:Normal] Hair Growth on Extremity: [Left:Yes] [Right:Yes] Temperature of Extremity: [Left:Warm] [Right:Warm] Capillary Refill: [Left:< 3 seconds] [Right:< 3 seconds] Blood Pressure: Brachial: [Left:134] [Right:136] Dorsalis Pedis: 122 [Left:Dorsalis Pedis: 100] Ankle: Posterior Tibial: 100 [Left:Posterior Tibial: 88 0.90] [Right:0.74] Toe Nail Assessment Left: Right: Thick: Yes Yes Discolored: No No Deformed: No No Improper Length and Hygiene: No No Electronic Signature(s) Signed: 07/08/2017 2:56:30 PM By: Elpidio EricAfful, Rita BSN, RN Entered By: Elpidio EricAfful, Rita on 07/08/2017 13:25:32 Christman, Britta Donovan ([id number]) -------------------------------------------------------------------------------- Multi Wound Chart Details Patient Name: Jessica DanceGLASS, Kariyah Date of Service: 07/08/2017 12:45 PM Medical Record Number: [id number] Patient Account Number: [id number] Date of Birth/Sex: 07-Nov-1946 (70 y.o. Female) Treating RN: Clover MealyAfful, RN, BSN, [employer] Primary Care Ceana Fiala: Garlon HatchetMCCONVILLE, ROBERT Other Clinician: Referring Armeda Plumb: Dorothea GlassmanMALINDA, PAUL Treating Calbert Hulsebus/Extender: Rudene ReBritto, Errol Weeks in Treatment: 0 Vital Signs Height(in): 66 Pulse(bpm): 72 Weight(lbs): 191 Blood Pressure 134/114 (mmHg): Body Mass Index(BMI): 31 Temperature(F): 97.8 Respiratory Rate 16 (breaths/min): Photos: [1:No Photos] [2:No Photos] [3:No Photos] Wound Location: [1:Left Malleolus] [2:Right Malleolus] [3:Right Toe Second - Dorsal] Wounding  Event: [1:Gradually Appeared] [2:Gradually Appeared] [3:Gradually Appeared] Primary Etiology: [1:Diabetic Wound/Ulcer of the Lower Extremity] [2:Diabetic Wound/Ulcer of the Lower Extremity] [3:Diabetic Wound/Ulcer of the Lower Extremity] Comorbid History: [1:Sleep Apnea, Coronary Artery Disease, Hypertension, Myocardial Infarction, Type II Diabetes, History of pressure wounds, Osteoarthritis, Neuropathy, Confinement Anxiety] [2:Sleep Apnea, Coronary Artery Disease, Hypertension,  Myocardial Infarction, Type II Diabetes, History of pressure wounds, Osteoarthritis, Neuropathy, Confinement Anxiety] [3:Sleep Apnea, Coronary Artery Disease, Hypertension, Myocardial Infarction, Type II Diabetes, History of pressure wounds,  Osteoarthritis, Neuropathy, Confinement Anxiety] Date Acquired: [1:02/17/2017] [2:02/17/2017] [3:02/17/2017] Weeks of Treatment: [1:0] [2:0] [3:0] Wound Status: [1:Open] [2:Open] [3:Open] Measurements L x W x D 0.5x0.9x0.1 [2:1.2x1.3x0.1] [3:0.7x0.5x0.1] (cm) Area (cm) : [1:0.353] [2:1.225] [3:0.275] Volume (cm) : [1:0.035] [2:0.123] [3:0.027] Classification: [1:Grade 1] [2:Grade 1] [3:Grade 1] Exudate Amount: [1:Medium] [2:Medium] [3:Medium] Exudate Type: [1:Serosanguineous] [2:Serosanguineous] [3:Serosanguineous] Exudate Color: [1:red, brown] [2:red, brown] [3:red, brown] Wound Margin: [1:Flat and Intact] [2:Distinct, outline attached] [3:Flat and Intact] Granulation Amount: [1:None Present (0%)] [2:None Present (0%)] [3:None Present (0%)]  Necrotic Amount: [1:Large (67-100%)] [2:Large (67-100%)] [3:Large (67-100%)] Necrotic Tissue: [1:Adherent Slough] [2:Adherent Slough] [3:Eschar, Adherent Slough] Exposed Structures: Mccarry, Mahima ([id number]) Fat Layer (Subcutaneous Fat Layer (Subcutaneous Fat Layer (Subcutaneous Tissue) Exposed: Yes Tissue) Exposed: Yes Tissue) Exposed: Yes Fascia: No Fascia: No Fascia: No Tendon: No Tendon: No Tendon: No Muscle: No Muscle:  No Muscle: No Joint: No Joint: No Joint: No Bone: No Bone: No Bone: No Epithelialization: None None None Periwound Skin Texture: Excoriation: No Excoriation: No Excoriation: No Induration: No Induration: No Induration: No Callus: No Callus: No Callus: No Crepitus: No Crepitus: No Crepitus: No Rash: No Rash: No Rash: No Scarring: No Scarring: No Scarring: No Periwound Skin Maceration: No Maceration: No Maceration: No Moisture: Dry/Scaly: No Dry/Scaly: No Dry/Scaly: No Periwound Skin Color: Erythema: Yes Ecchymosis: Yes Atrophie Blanche: No Atrophie Blanche: No Atrophie Blanche: No Cyanosis: No Cyanosis: No Cyanosis: No Ecchymosis: No Ecchymosis: No Erythema: No Erythema: No Hemosiderin Staining: No Hemosiderin Staining: No Hemosiderin Staining: No Mottled: No Mottled: No Mottled: No Pallor: No Pallor: No Pallor: No Rubor: No Rubor: No Rubor: No Erythema Location: Circumferential N/A N/A Temperature: No Abnormality No Abnormality No Abnormality Tenderness on No No Yes Palpation: Wound Preparation: Ulcer Cleansing: Ulcer Cleansing: Ulcer Cleansing: Rinsed/Irrigated with Rinsed/Irrigated with Rinsed/Irrigated with Saline Saline Saline Topical Anesthetic Topical Anesthetic Topical Anesthetic Applied: Other: lidocaine Applied: Other: lidocaine Applied: Other: lidocaine 4% 4% 4% Treatment Notes Electronic Signature(s) Signed: 07/08/2017 2:56:30 PM By: Elpidio Eric BSN, RN Entered By: Elpidio Eric on 07/08/2017 13:36:25 Jessica Donovan ([id number]) -------------------------------------------------------------------------------- Multi-Disciplinary Care Plan Details Patient Name: Jessica Donovan Date of Service: 07/08/2017 12:45 PM Medical Record Number: [id number] Patient Account Number: [id number] Date of Birth/Sex: 12-02-1946 (70 y.o. Female) Treating RN: Afful, RN, BSN, [employer] Primary Care Javonna Balli: Garlon Hatchet Other Clinician: Referring Harm Jou:  Dorothea Glassman Treating Alexxus Sobh/Extender: Rudene Re in Treatment: 0 Active Inactive ` Orientation to the Wound Care Program Nursing Diagnoses: Knowledge deficit related to the wound healing center program Goals: Patient/caregiver will verbalize understanding of the Wound Healing Center Program Date Initiated: 07/08/2017 Target Resolution Date: 10/08/2017 Goal Status: Active Interventions: Provide education on orientation to the wound center Notes: ` Peripheral Neuropathy Nursing Diagnoses: Knowledge deficit related to disease process and management of peripheral neurovascular dysfunction Potential alteration in peripheral tissue perfusion (select prior to confirmation of diagnosis) Goals: Patient/caregiver will verbalize understanding of disease process and disease management Date Initiated: 07/08/2017 Target Resolution Date: 10/08/2017 Goal Status: Active Interventions: Assess signs and symptoms of neuropathy upon admission and as needed Provide education on Management of Neuropathy and Related Ulcers Provide education on Management of Neuropathy upon discharge from the Wound Center Treatment Activities: Patient referred for customized footwear/offloading : 07/08/2017 Patient referred to diabetes educator : 07/08/2017 Jessica Donovan ([id number]) Notes: ` Wound/Skin Impairment Nursing Diagnoses: Impaired tissue integrity Knowledge deficit related to ulceration/compromised skin integrity Goals: Patient/caregiver will verbalize understanding of skin care regimen Date Initiated: 07/08/2017 Target Resolution Date: 10/08/2017 Goal Status: Active Ulcer/skin breakdown will have a volume reduction of 30% by week 4 Date Initiated: 07/08/2017 Target Resolution Date: 10/08/2017 Goal Status: Active Ulcer/skin breakdown will have a volume reduction of 50% by week 8 Date Initiated: 07/08/2017 Target Resolution Date: 10/08/2017 Goal Status: Active Ulcer/skin breakdown will  have a volume reduction of 80% by week 12 Date Initiated: 07/08/2017 Target Resolution Date: 10/08/2017 Goal Status: Active Ulcer/skin breakdown will heal within 14 weeks Date Initiated: 07/08/2017 Target Resolution Date: 10/08/2017 Goal Status: Active Interventions: Assess patient/caregiver ability to obtain necessary supplies Assess patient/caregiver  ability to perform ulcer/skin care regimen upon admission and as needed Assess ulceration(s) every visit Treatment Activities: Referred to DME Sharad Vaneaton for dressing supplies : 07/08/2017 Skin care regimen initiated : 07/08/2017 Topical wound management initiated : 07/08/2017 Notes: Electronic Signature(s) Signed: 07/08/2017 2:56:30 PM By: Elpidio Eric BSN, RN Entered By: Elpidio Eric on 07/08/2017 13:36:04 Dejarnette, [name] ([id number]) -------------------------------------------------------------------------------- Pain Assessment Details Patient Name: Jessica Donovan Date of Service: 07/08/2017 12:45 PM Medical Record Number: [id number] Patient Account Number: [id number] Date of Birth/Sex: [birth year] (70 y.o. Female) Treating RN: Clover Mealy, RN, BSN, [employer] Primary Care Raford Brissett: Garlon Hatchet Other Clinician: Referring Allee Busk: Dorothea Glassman Treating Evora Schechter/Extender: Rudene Re in Treatment: 0 Active Problems Location of Pain Severity and Description of Pain Patient Has Paino No Site Locations With Dressing Change: No Pain Management and Medication Current Pain Management: Electronic Signature(s) Signed: 07/08/2017 2:56:30 PM By: Elpidio Eric BSN, RN Entered By: Elpidio Eric on 07/08/2017 12:58:42 Jessica Donovan ([id number]) -------------------------------------------------------------------------------- Patient/Caregiver Education Details Patient Name: Jessica Donovan Date of Service: 07/08/2017 12:45 PM Medical Record Number: [id number] Patient Account Number: [id number] Date of Birth/Gender: 04-10-1946 (70 y.o.  Female) Treating RN: Clover Mealy, RN, BSN, [employer] Primary Care Physician: Garlon Hatchet Other Clinician: Referring Physician: Dorothea Glassman Treating Physician/Extender: Rudene Re in Treatment: 0 Education Assessment Education Provided To: Patient Education Topics Provided Peripheral Neuropathy: Methods: Explain/Verbal Responses: State content correctly Welcome To The Wound Care Center: Methods: Explain/Verbal Responses: State content correctly Electronic Signature(s) Signed: 07/08/2017 2:56:30 PM By: Elpidio Eric BSN, RN Entered By: Elpidio Eric on 07/08/2017 14:07:58 Jessica Donovan ([id number]) -------------------------------------------------------------------------------- Wound Assessment Details Patient Name: Jessica Donovan Date of Service: 07/08/2017 12:45 PM Medical Record Number: [id number] Patient Account Number: [id number] Date of Birth/Sex: 1946/10/07 (70 y.o. Female) Treating RN: Afful, RN, BSN, [employer] Primary Care Octavion Mollenkopf: Garlon Hatchet Other Clinician: Referring Montey Ebel: Dorothea Glassman Treating Taaliyah Delpriore/Extender: Rudene Re in Treatment: 0 Wound Status Wound Number: 1 Primary Diabetic Wound/Ulcer of the Lower Etiology: Extremity Wound Location: Left Malleolus Wound Open Wounding Event: Gradually Appeared Status: Date Acquired: 02/17/2017 Comorbid Sleep Apnea, Coronary Artery Disease, Weeks Of Treatment: 0 History: Hypertension, Myocardial Infarction, Clustered Wound: No Type II Diabetes, History of pressure wounds, Osteoarthritis, Neuropathy, Confinement Anxiety Wound Measurements Length: (cm) 0.5 Width: (cm) 0.9 Depth: (cm) 0.1 Area: (cm) 0.353 Volume: (cm) 0.035 % Reduction in Area: % Reduction in Volume: Epithelialization: None Tunneling: No Undermining: No Wound Description Classification: Grade 1 Wound Margin: Flat and Intact Exudate Amount: Medium Exudate Type: Serosanguineous Exudate Color: red, brown Foul Odor After  Cleansing: No Slough/Fibrino Yes Wound Bed Granulation Amount: None Present (0%) Exposed Structure Necrotic Amount: Large (67-100%) Fascia Exposed: No Necrotic Quality: Adherent Slough Fat Layer (Subcutaneous Tissue) Exposed: Yes Tendon Exposed: No Muscle Exposed: No Joint Exposed: No Bone Exposed: No Periwound Skin Texture Texture Color No Abnormalities Noted: No No Abnormalities Noted: No Callus: No Atrophie Blanche: No Crepitus: No Cyanosis: No [place], Adrian ([id number]) Excoriation: No Ecchymosis: No Induration: No Erythema: Yes Rash: No Erythema Location: Circumferential Scarring: No Hemosiderin Staining: No Mottled: No Moisture Pallor: No No Abnormalities Noted: No Rubor: No Dry / Scaly: No Maceration: No Temperature / Pain Temperature: No Abnormality Wound Preparation Ulcer Cleansing: Rinsed/Irrigated with Saline Topical Anesthetic Applied: Other: lidocaine 4%, Treatment Notes Wound #1 (Left Malleolus) 1. Cleansed with: Clean wound with Normal Saline 4. Dressing Applied: Prisma Ag Santyl Ointment 5. Secondary Dressing Applied Bordered Foam Dressing Dry Gauze Electronic Signature(s) Signed: 07/08/2017 2:56:30 PM By: Elpidio Eric BSN, RN Entered By: Elpidio Eric on 07/08/2017 13:09:05  SHANTOYA, GEURTS ([id number]) -------------------------------------------------------------------------------- Wound Assessment Details Patient Name: YAZLIN, EKBLAD Date of Service: 07/08/2017 12:45 PM Medical Record Number: [id number] Patient Account Number: [id number] Date of Birth/Sex: 1946-01-02 (71 y.o. Female) Treating RN: Afful, RN, BSN, [employer] Primary Care Azalee Weimer: Garlon Hatchet Other Clinician: Referring Sharmel Ballantine: Dorothea Glassman Treating Carolene Gitto/Extender: Rudene Re in Treatment: 0 Wound Status Wound Number: 2 Primary Diabetic Wound/Ulcer of the Lower Etiology: Extremity Wound Location: Right Malleolus Wound Open Wounding Event: Gradually  Appeared Status: Date Acquired: 02/17/2017 Comorbid Sleep Apnea, Coronary Artery Disease, Weeks Of Treatment: 0 History: Hypertension, Myocardial Infarction, Clustered Wound: No Type II Diabetes, History of pressure wounds, Osteoarthritis, Neuropathy, Confinement Anxiety Photos Photo Uploaded By: Elpidio Eric on 07/08/2017 14:51:47 Wound Measurements Length: (cm) 1.2 Width: (cm) 1.3 Depth: (cm) 0.1 Area: (cm) 1.225 Volume: (cm) 0.123 % Reduction in Area: % Reduction in Volume: Epithelialization: None Tunneling: No Undermining: No Wound Description Classification: Grade 1 Wound Margin: Distinct, outline attached Exudate Amount: Medium Exudate Type: Serosanguineous Exudate Color: red, brown Foul Odor After Cleansing: No Slough/Fibrino Yes Wound Bed Granulation Amount: None Present (0%) Exposed Structure Necrotic Amount: Large (67-100%) Fascia Exposed: No Mcgurk, Eiliana ([id number]) Necrotic Quality: Adherent Slough Fat Layer (Subcutaneous Tissue) Exposed: Yes Tendon Exposed: No Muscle Exposed: No Joint Exposed: No Bone Exposed: No Periwound Skin Texture Texture Color No Abnormalities Noted: No No Abnormalities Noted: No Callus: No Atrophie Blanche: No Crepitus: No Cyanosis: No Excoriation: No Ecchymosis: Yes Induration: No Erythema: No Rash: No Hemosiderin Staining: No Scarring: No Mottled: No Pallor: No Moisture Rubor: No No Abnormalities Noted: No Dry / Scaly: No Temperature / Pain Maceration: No Temperature: No Abnormality Wound Preparation Ulcer Cleansing: Rinsed/Irrigated with Saline Topical Anesthetic Applied: Other: lidocaine 4%, Treatment Notes Wound #2 (Right Malleolus) 1. Cleansed with: Clean wound with Normal Saline 4. Dressing Applied: Prisma Ag Santyl Ointment 5. Secondary Dressing Applied Bordered Foam Dressing Dry Gauze Electronic Signature(s) Signed: 07/08/2017 2:56:30 PM By: Elpidio Eric BSN, RN Entered By: Elpidio Eric on  07/08/2017 13:12:28 Jessica Donovan ([id number]) -------------------------------------------------------------------------------- Wound Assessment Details Patient Name: Jessica Donovan Date of Service: 07/08/2017 12:45 PM Medical Record Number: [id number] Patient Account Number: [id number] Date of Birth/Sex: 01/24/1946 (70 y.o. Female) Treating RN: Afful, RN, BSN, [employer] Primary Care Ariyan Brisendine: Garlon Hatchet Other Clinician: Referring Deara Bober: Dorothea Glassman Treating Jayren Cease/Extender: Rudene Re in Treatment: 0 Wound Status Wound Number: 3 Primary Diabetic Wound/Ulcer of the Lower Etiology: Extremity Wound Location: Right Toe Second - Dorsal Wound Open Wounding Event: Gradually Appeared Status: Date Acquired: 02/17/2017 Comorbid Sleep Apnea, Coronary Artery Disease, Weeks Of Treatment: 0 History: Hypertension, Myocardial Infarction, Clustered Wound: No Type II Diabetes, History of pressure wounds, Osteoarthritis, Neuropathy, Confinement Anxiety Photos Photo Uploaded By: Elpidio Eric on 07/08/2017 14:52:25 Wound Measurements Length: (cm) 0.7 Width: (cm) 0.5 Depth: (cm) 0.1 Area: (cm) 0.275 Volume: (cm) 0.027 % Reduction in Area: % Reduction in Volume: Epithelialization: None Tunneling: No Undermining: No Wound Description Classification: Grade 1 Foul Odor Aft Wound Margin: Flat and Intact Slough/Fibrin Exudate Amount: Medium Exudate Type: Serosanguineous Exudate Color: red, brown er Cleansing: No o No Wound Bed Granulation Amount: None Present (0%) Exposed Structure Necrotic Amount: Large (67-100%) Fascia Exposed: No Espinoza, Kyliee ([id number]) Necrotic Quality: Eschar, Adherent Slough Fat Layer (Subcutaneous Tissue) Exposed: Yes Tendon Exposed: No Muscle Exposed: No Joint Exposed: No Bone Exposed: No Periwound Skin Texture Texture Color No Abnormalities Noted: No No Abnormalities Noted: No Callus: No Atrophie Blanche: No Crepitus:  No Cyanosis: No Excoriation: No Ecchymosis: No Induration: No Erythema: No  Rash: No Hemosiderin Staining: No Scarring: No Mottled: No Pallor: No Moisture Rubor: No No Abnormalities Noted: No Dry / Scaly: No Temperature / Pain Maceration: No Temperature: No Abnormality Tenderness on Palpation: Yes Wound Preparation Ulcer Cleansing: Rinsed/Irrigated with Saline Topical Anesthetic Applied: Other: lidocaine 4%, Electronic Signature(s) Signed: 07/08/2017 2:56:30 PM By: Elpidio Eric BSN, RN Entered By: Elpidio Eric on 07/08/2017 13:17:18 Kester, [name] ([id number]) -------------------------------------------------------------------------------- Vitals Details Patient Name: Jessica Donovan Date of Service: 07/08/2017 12:45 PM Medical Record Number: [id number] Patient Account Number: [id number] Date of Birth/Sex: Mar 08, 1946 (71 y.o. Female) Treating RN: Afful, RN, BSN, [employer] Primary Care Amma Crear: Garlon Hatchet Other Clinician: Referring Hosam Mcfetridge: Dorothea Glassman Treating Naava Janeway/Extender: Rudene Re in Treatment: 0 Vital Signs Time Taken: 12:58 Temperature (F): 97.8 Height (in): 66 Pulse (bpm): 72 Source: Stated Respiratory Rate (breaths/min): 16 Weight (lbs): 191 Blood Pressure (mmHg): 134/114 Source: Measured Reference Range: 80 - 120 mg / dl Body Mass Index (BMI): 30.8 Electronic Signature(s) Signed: 07/08/2017 2:56:30 PM By: Elpidio Eric BSN, RN Entered By: Elpidio Eric on 07/08/2017 12:59:25

## 2017-07-09 NOTE — Progress Notes (Signed)
Jessica Donovan, Jessica Donovan ([id number]) Visit Report for 07/08/2017 Debridement Details Patient Name: Jessica Donovan Date of Service: 07/08/2017 12:45 PM Medical Record Number: [id number] Patient Account Number: [id number] Date of Birth/Sex: 06/02/1946 (71 y.o. Female) Treating RN: Afful, RN, BSN, [employer] Primary Care Provider: Garlon Hatchet Other Clinician: Referring Provider: Dorothea Glassman Treating Provider/Extender: Rudene Re in Treatment: 0 Debridement Performed for Wound #1 Left Malleolus Assessment: Performed By: Physician Evlyn Kanner, MD Debridement: Debridement Severity of Tissue Pre Fat layer exposed Debridement: Pre-procedure Verification/Time Out Yes - 13:34 Taken: Start Time: 13:34 Pain Control: Lidocaine 4% Topical Solution Level: Skin/Subcutaneous Tissue Total Area Debrided (L x 0.5 (cm) x 0.9 (cm) = 0.45 (cm) W): Tissue and other Non-Viable, Exudate, Fat, Fibrin/Slough, Subcutaneous material debrided: Instrument: Curette Bleeding: Minimum Hemostasis Achieved: Pressure End Time: 13:37 Procedural Pain: 0 Post Procedural Pain: 0 Response to Treatment: Procedure was tolerated well Post Debridement Measurements of Total Wound Length: (cm) 0.5 Width: (cm) 0.9 Depth: (cm) 0.1 Volume: (cm) 0.035 Character of Wound/Ulcer Post Stable Debridement: Severity of Tissue Post Debridement: Fat layer exposed Post Procedure Diagnosis Same as Pre-procedure Jessica Donovan, Jessica Donovan ([id number]) Electronic Signature(s) Signed: 07/08/2017 1:59:14 PM By: Evlyn Kanner MD, FACS Signed: 07/08/2017 2:56:30 PM By: Elpidio Eric BSN, RN Entered By: Evlyn Kanner on 07/08/2017 13:59:14 Jessica Donovan ([id number]) -------------------------------------------------------------------------------- Debridement Details Patient Name: Jessica Donovan Date of Service: 07/08/2017 12:45 PM Medical Record Number: [id number] Patient Account Number: [id number] Date of Birth/Sex: [birth year] (70 y.o.  Female) Treating RN: Afful, RN, BSN, [employer] Primary Care Provider: Garlon Hatchet Other Clinician: Referring Provider: Dorothea Glassman Treating Provider/Extender: Rudene Re in Treatment: 0 Debridement Performed for Wound #2 Right Malleolus Assessment: Performed By: Physician Evlyn Kanner, MD Debridement: Debridement Severity of Tissue Pre Fat layer exposed Debridement: Pre-procedure Verification/Time Out Yes - 13:34 Taken: Start Time: 13:34 Pain Control: Lidocaine 4% Topical Solution Level: Skin/Subcutaneous Tissue Total Area Debrided (L x 1.2 (cm) x 1.3 (cm) = 1.56 (cm) W): Tissue and other Non-Viable, Exudate, Fat, Fibrin/Slough, Subcutaneous material debrided: Instrument: Curette Bleeding: Minimum Hemostasis Achieved: Pressure End Time: 13:37 Procedural Pain: 0 Post Procedural Pain: 0 Response to Treatment: Procedure was tolerated well Post Debridement Measurements of Total Wound Length: (cm) 1.2 Width: (cm) 1.3 Depth: (cm) 0.1 Volume: (cm) 0.123 Character of Wound/Ulcer Post Stable Debridement: Severity of Tissue Post Debridement: Fat layer exposed Post Procedure Diagnosis Same as Pre-procedure Electronic Signature(s) Signed: 07/08/2017 1:59:22 PM By: Evlyn Kanner MD, FACS Signed: 07/08/2017 2:56:30 PM By: Elpidio Eric BSN, RN Jessica Donovan, Jessica Donovan ([id number]) Entered By: Evlyn Kanner on 07/08/2017 13:59:21 Jessica Donovan, Jessica Donovan ([id number]) -------------------------------------------------------------------------------- HPI Details Patient Name: Jessica Donovan Date of Service: 07/08/2017 12:45 PM Medical Record Number: [id number] Patient Account Number: [id number] Date of Birth/Sex: 07/10/1946 (70 y.o. Female) Treating RN: Clover Mealy, RN, BSN, [employer] Primary Care Provider: Garlon Hatchet Other Clinician: Referring Provider: Dorothea Glassman Treating Provider/Extender: Rudene Re in Treatment: 0 History of Present Illness HPI Description: 71 year old  diabetic patient who has chronic back pain collagen vascular disease coronary artery disease hypertension and history of osteomyelitis of the toe in February 2017. 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 She also has had joint replacement surgery in the past. she was recently reviewed by the pH and care management team and had a problem with the toe on her right foot and her left ankle had minimal drainage in the right ankle was draining worse. she was seen in the ER on 06/22/2017, and on the x-ray was no evidence of any acute  bony abnormality. Electronic Signature(s) Signed: 07/08/2017 1:57:58 PM By: Evlyn Kanner MD, FACS Previous Signature: 07/08/2017 1:20:12 PM Version By: Evlyn Kanner MD, FACS Entered By: Evlyn Kanner on 07/08/2017 13:57:58 Jessica Donovan, Jessica Donovan ([id number]) -------------------------------------------------------------------------------- Physical Exam Details Patient Name: Jessica Donovan Date of Service: 07/08/2017 12:45 PM Medical Record Number: [id number] Patient Account Number: [id number] Date of Birth/Sex: 01/23/1946 (71 y.o. Female) Treating RN: Clover Mealy, RN, BSN, [employer] Primary Care Provider: Garlon Hatchet Other Clinician: Referring Provider: Dorothea Glassman Treating Provider/Extender: Rudene Re in Treatment: 0 Constitutional . Pulse regular. Respirations normal and unlabored. Afebrile. . Eyes Nonicteric. Reactive to light. Ears, Nose, Mouth, and Throat Lips, teeth, and gums WNL.[name] Moist mucosa without lesions. Neck supple and nontender. No palpable supraclavicular or cervical adenopathy. Normal sized without goiter. Respiratory WNL. No retractions.. Cardiovascular Pedal Pulses WNL. the ABI on the left was 0.9 in the right was 0.74. No clubbing, cyanosis or edema.. Chest Breasts symmetical and no nipple discharge.. Breast tissue WNL, no masses, lumps, or tenderness.. Gastrointestinal (GI) Abdomen without masses or  tenderness.. No liver or spleen enlargement or tenderness.. Lymphatic No adneopathy. No adenopathy. No adenopathy. Musculoskeletal Adexa without tenderness or enlargement.. Digits and nails w/o clubbing, cyanosis, infection, petechiae, ischemia, or inflammatory conditions.. Integumentary (Hair, Skin) No suspicious lesions. No crepitus or fluctuance. No peri-wound warmth or erythema. No masses.[name] Psychiatric Judgement and insight Intact.. No evidence of depression, anxiety, or agitation.. Notes the patient has no significant ulcerations on the toes. The right lateral malleolus and the left lateral malleolus both have superficial wounds which needed minimal debridement bilaterally. bleeding controlled with pressure Electronic Signature(s) Signed: 07/08/2017 1:59:01 PM By: Evlyn Kanner MD, FACS Entered By: Evlyn Kanner on 07/08/2017 13:59:00 Jessica Donovan, Jessica Donovan ([id number]) Jessica Donovan, Jessica Donovan ([id number]) -------------------------------------------------------------------------------- Physician Orders Details Patient Name: Jessica Donovan Date of Service: 07/08/2017 12:45 PM Medical Record Number: [id number] Patient Account Number: [id number] Date of Birth/Sex: 1946-10-09 (71 y.o. Female) Treating RN: Afful, RN, BSN, [employer] Primary Care Provider: Garlon Hatchet Other Clinician: Referring Provider: Dorothea Glassman Treating Provider/Extender: Rudene Re in Treatment: 0 Verbal / Phone Orders: No Diagnosis Coding Wound Cleansing Wound #1 Left Malleolus o Cleanse wound with mild soap and water o May Shower, gently pat wound dry prior to applying new dressing. o May shower with protection. Wound #2 Right Malleolus o Cleanse wound with mild soap and water o May Shower, gently pat wound dry prior to applying new dressing. o May shower with protection. Wound #3 Right,Dorsal Toe Second o Cleanse wound with mild soap and water o May Shower, gently pat wound dry prior to  applying new dressing. o May shower with protection. Primary Wound Dressing Wound #2 Right Malleolus o Santyl Ointment Wound #1 Left Malleolus o Prisma Ag Secondary Dressing Wound #1 Left Malleolus o Dry Gauze o Boardered Foam Dressing Wound #2 Right Malleolus o Dry Gauze o Boardered Foam Dressing Wound #3 Right,Dorsal Toe Second o Dry Gauze o Boardered Foam Dressing Jessica Donovan, Jessica Donovan ([id number]) Dressing Change Frequency Wound #2 Right Malleolus o Change dressing every day. Wound #1 Left Malleolus o Change dressing every other day. Follow-up Appointments Wound #1 Left Malleolus o Return Appointment in 1 week. Wound #2 Right Malleolus o Return Appointment in 1 week. Wound #3 Right,Dorsal Toe Second o Return Appointment in 1 week. Additional Orders / Instructions Wound #1 Left Malleolus o Increase protein intake. o Activity as tolerated Wound #2 Right Malleolus o Increase protein intake. o Activity as tolerated Wound #3 Right,Dorsal Toe Second o Increase protein intake. o Activity as tolerated  Electronic Signature(s) Signed: 07/08/2017 2:56:30 PM By: Elpidio Eric BSN, RN Signed: 07/08/2017 4:39:35 PM By: Evlyn Kanner MD, FACS Entered By: Elpidio Eric on 07/08/2017 13:53:05 Jessica Donovan, Jessica Donovan ([id number]) -------------------------------------------------------------------------------- Problem List Details Patient Name: Jessica Donovan Date of Service: 07/08/2017 12:45 PM Medical Record Number: [id number] Patient Account Number: [id number] Date of Birth/Sex: 1946-07-17 (70 y.o. 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 Female) Treating RN: Afful, RN, BSN, [employer] Primary Care Provider: Garlon Hatchet Other Clinician: Referring Provider: Dorothea Glassman Treating Provider/Extender: Rudene Re in Treatment: 0 Subjective History of Present Illness (HPI) 71 year old diabetic patient who has chronic back pain collagen vascular disease coronary artery disease hypertension and history of osteomyelitis of the toe in February 2017. 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 She also has had joint replacement surgery in the past. she was recently reviewed by the pH and care management team and had a problem with the toe on her right foot and her left ankle had minimal drainage in the right ankle was draining worse. she was seen in the ER on 06/22/2017, and on the x-ray was no evidence of any acute bony abnormality. Wound History Patient presents with 3 open wounds. Patient has been treating wounds in the following manner: santyl. Laboratory tests have not been performed in the last month. Patient reportedly has not tested positive for an antibiotic resistant organism. Patient reportedly has not tested positive for osteomyelitis. Patient reportedly has not had testing performed to evaluate circulation in the legs. Patient experiences the following  problems associated with their wounds: infection. Patient History Information obtained from Patient, Caregiver, Chart. Allergies codeine, methadone, influenza vaccines, percoset, tetanus toxoids, tetracyclines Family History Stroke - Mother, No family history of Cancer, Diabetes, Heart Disease, Hereditary Spherocytosis, Hypertension, Kidney Disease, Lung Disease, Seizures, Thyroid Problems, Tuberculosis. Social History Never smoker, Marital Status - Single, Alcohol Use - Never, Drug Use - No History, Caffeine Use - Never. Medical History Eyes Denies history of Cataracts, Glaucoma, Optic Neuritis Ear/Nose/Mouth/Throat Denies history of Chronic sinus problems/congestion, Middle ear problems Hematologic/Lymphatic Denies history of Anemia, Hemophilia, Human Immunodeficiency Virus, Lymphedema, Sickle Cell Disease Respiratory Conti, Desiree ([id number]) Patient has history of Sleep Apnea Denies history of Aspiration, Asthma, Chronic Obstructive Pulmonary Disease (COPD), Pneumothorax, Tuberculosis Cardiovascular Patient has history of Coronary Artery Disease, Hypertension, Myocardial Infarction Gastrointestinal Denies history of Cirrhosis , Colitis, Crohn s, Hepatitis A, Hepatitis B, Hepatitis C Endocrine Patient has history of Type II Diabetes Immunological Denies history of Lupus Erythematosus, Raynaud s, Scleroderma Integumentary (Skin) Patient has history of History of pressure wounds Denies history of History of Burn Musculoskeletal Patient has history of Osteoarthritis Denies history of Gout, Rheumatoid Arthritis, Osteomyelitis Neurologic Patient has history of Neuropathy Denies history of Dementia, Quadriplegia, Paraplegia, Seizure Disorder Oncologic Denies history of Received Chemotherapy,  Received Radiation Psychiatric Patient has history of Confinement Anxiety Denies history of Anorexia/bulimia Patient is treated with Insulin. Blood sugar is not tested. Blood sugar  results noted at the following times: Breakfast - 178. Review of Systems (ROS) Constitutional Symptoms (General Health) The patient has no complaints or symptoms. Eyes Complains or has symptoms of Glasses / Contacts. Ear/Nose/Mouth/Throat The patient has no complaints or symptoms. Hematologic/Lymphatic The patient has no complaints or symptoms. Respiratory The patient has no complaints or symptoms. Gastrointestinal The patient has no complaints or symptoms. Endocrine The patient has no complaints or symptoms. Genitourinary The patient has no complaints or symptoms. Immunological The patient has no complaints or symptoms. Integumentary (Skin) Complains or has symptoms of Wounds. Musculoskeletal Caputi, Jennfer ([id number]) The patient has no complaints or symptoms. Neurologic The patient has no complaints or symptoms. Oncologic The patient has no complaints or symptoms. Psychiatric Complains or has symptoms of Anxiety, depression Medications mirtazapine 15 mg tablet oral 1 1 tablet oral nightly acetaminophen 500 mg tablet oral 2 2 tablet oral (1000 mg.) every four hours as needed morphine 15 mg immediate release tablet oral 1 1 tablet oral three times daily as needed morphine ER 15 mg tablet, crush resistant, extended release 12 hr oral 1 1 tablet,oral only,ext.rel.12 hr oral every 12 hours alprazolam 1 mg tablet oral 1 1 tablet oral three times daily Lyrica 150 mg capsule oral 1 1 capsule oral two times daily diphenoxylate-atropine 2.5 mg-0.025 mg tablet oral 1 1 tablet oral every four hours as needed promethazine 25 mg tablet oral 1 1 tablet oral every six hours as needed atorvastatin 20 mg tablet oral 1 1 tablet oral nightly lisinopril 20 mg tablet oral 1 1 tablet oral daily metoprolol tartrate 25 mg tablet oral 1 1 tablet oral two times daily Novolog U-100 Insulin aspart 100 unit/mL subcutaneous solution subcutaneous solution subcutaneous per sliding scale Toujeo  SoloStar U-300 Insulin 300 unit/mL (1.5 mL) subcutaneous pen subcutaneous insulin pen subcutaneous 30 units daily ferrous sulfate 325 mg (65 mg iron) tablet oral 1 1 tablet oral daily furosemide 20 mg tablet oral 1 1 tablet oral daily as needed multivitamin with minerals tablet oral 1 1 tablet oral daily Macrodantin 100 mg capsule oral 1 1 capsule oral daily celecoxib 200 mg capsule oral 1 1 capsule oral daily amoxicillin 875 mg-potassium clavulanate 125 mg tablet oral 1 1 tablet oral two times daily aspirin 81 mg tablet,delayed release oral 1 1 tablet,delayed release (DR/EC) oral daily omeprazole 20 mg capsule,delayed release oral 1 1 capsule,delayed release(DR/EC) oral daily trazodone 100 mg tablet oral 1 1 tablet oral nightly venlafaxine ER 225 mg tablet,extended release 24 hr oral 1 1 tablet extended release 24hr oral daily cyclobenzaprine 10 mg tablet oral 1 1 tablet oral three times daily liothyronine 25 mcg tablet oral 1 1 tablet oral daily Toviaz 8 mg tablet,extended release oral 1 1 tablet extended release 24 hr oral nightly isosorbide mononitrate ER 30 mg tablet,extended release 24 hr oral tablet extended release 24 hr oral daily nitroglycerin 0.4 mg sublingual tablet sublingual 1 1 tablet, sublingual sublingual every five minutes as needed biotin 5 mg capsule oral 1 1 capsule oral daily Polivka, Matisse ([id number]) Objective Constitutional Pulse regular. Respirations normal and unlabored. Afebrile. Vitals Time Taken: 12:58 PM, Height: 66 in, Source: Stated, Weight: 191 lbs, Source: Measured, BMI: 30.8, Temperature: 97.8 F, Pulse: 72 bpm, Respiratory Rate: 16 breaths/min, Blood Pressure: 134/114 mmHg. Eyes Nonicteric. Reactive to light. Ears, Nose, Mouth, and Throat Lips, teeth, and gums  WNL.. Moist mucosa without lesions. Neck supple and nontender. No palpable supraclavicular or cervical adenopathy. Normal sized without goiter. Respiratory WNL. No  retractions.. Cardiovascular Pedal Pulses WNL. the ABI on the left was 0.9 in the right was 0.74. No clubbing, cyanosis or edema.. Chest Breasts symmetical and no nipple discharge.. Breast tissue WNL, no masses, lumps, or tenderness.. Gastrointestinal (GI) Abdomen without masses or tenderness.. No liver or spleen enlargement or tenderness.. Lymphatic No adneopathy. No adenopathy. No adenopathy. Musculoskeletal Adexa without tenderness or enlargement.. Digits and nails w/o clubbing, cyanosis, infection, petechiae, ischemia, or inflammatory conditions.[name] Psychiatric Judgement and insight Intact.. No evidence of depression, anxiety, or agitation.. General Notes: the patient has no significant ulcerations on the toes. The right lateral malleolus and the left lateral malleolus both have superficial wounds which needed minimal debridement bilaterally. bleeding controlled with pressure Integumentary (Hair, Skin) No suspicious lesions. No crepitus or fluctuance. No peri-wound warmth or erythema. No masses.[name] Jessica Donovan, Jessica Donovan ([id number]) Wound #1 status is Open. Original cause of wound was Gradually Appeared. The wound is located on the Left Malleolus. The wound measures 0.5cm length x 0.9cm width x 0.1cm depth; 0.353cm^2 area and 0.035cm^3 volume. There is Fat Layer (Subcutaneous Tissue) Exposed exposed. There is no tunneling or undermining noted. There is a medium amount of serosanguineous drainage noted. The wound margin is flat and intact. There is no granulation within the wound bed. There is a large (67-100%) amount of necrotic tissue within the wound bed including Adherent Slough. The periwound skin appearance exhibited: Erythema. The periwound skin appearance did not exhibit: Callus, Crepitus, Excoriation, Induration, Rash, Scarring, Dry/Scaly, Maceration, Atrophie Blanche, Cyanosis, Ecchymosis, Hemosiderin Staining, Mottled, Pallor, Rubor. The surrounding wound skin color is noted with  erythema which is circumferential. Periwound temperature was noted as No Abnormality. Wound #2 status is Open. Original cause of wound was Gradually Appeared. The wound is located on the Right Malleolus. The wound measures 1.2cm length x 1.3cm width x 0.1cm depth; 1.225cm^2 area and 0.123cm^3 volume. There is Fat Layer (Subcutaneous Tissue) Exposed exposed. There is no tunneling or undermining noted. There is a medium amount of serosanguineous drainage noted. The wound margin is distinct with the outline attached to the wound base. There is no granulation within the wound bed. There is a large (67-100%) amount of necrotic tissue within the wound bed including Adherent Slough. The periwound skin appearance exhibited: Ecchymosis. The periwound skin appearance did not exhibit: Callus, Crepitus, Excoriation, Induration, Rash, Scarring, Dry/Scaly, Maceration, Atrophie Blanche, Cyanosis, Hemosiderin Staining, Mottled, Pallor, Rubor, Erythema. Periwound temperature was noted as No Abnormality. Wound #3 status is Open. Original cause of wound was Gradually Appeared. The wound is located on the Right,Dorsal Toe Second. The wound measures 0.7cm length x 0.5cm width x 0.1cm depth; 0.275cm^2 area and 0.027cm^3 volume. There is Fat Layer (Subcutaneous Tissue) Exposed exposed. There is no tunneling or undermining noted. There is a medium amount of serosanguineous drainage noted. The wound margin is flat and intact. There is no granulation within the wound bed. There is a large (67-100%) amount of necrotic tissue within the wound bed including Eschar and Adherent Slough. The periwound skin appearance did not exhibit: Callus, Crepitus, Excoriation, Induration, Rash, Scarring, Dry/Scaly, Maceration, Atrophie Blanche, Cyanosis, Ecchymosis, Hemosiderin Staining, Mottled, Pallor, Rubor, Erythema. Periwound temperature was noted as No Abnormality. The periwound has tenderness on palpation. 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 After review of recommended: 1. Santyl ointment for the right lateral leg which she has already been using. 2. Silver alginate to be applied with a bordered foam to her left lateral ankle. 3. Good control of her diabetes mellitus 4. Adequate offloading of both these areas 5. Adequate protein, vitamin E, vitamin C and zinc 6. Regular visits to the wound center Procedures Wound #1 Pre-procedure diagnosis of Wound #1 is a Diabetic Wound/Ulcer of the Lower Extremity located on the Left Malleolus .Severity of Tissue Pre Debridement is: Fat layer exposed. There was a Skin/Subcutaneous Tissue Debridement ([id number]) debridement with total area of 0.45 sq cm performed by Evlyn Kanner, MD. with the following instrument(s): Curette to remove Non-Viable tissue/material including Exudate, Fat Layer (and Subcutaneous Tissue) Exposed, Fibrin/Slough, and Subcutaneous after achieving pain control using Lidocaine 4% Topical Solution. A time out was conducted at 13:34, prior to the start of the procedure. A Minimum amount of bleeding was controlled with Pressure. The procedure was tolerated well with a pain level of 0 throughout and a pain level of 0 following the procedure. Post Debridement Measurements: 0.5cm length x 0.9cm width x 0.1cm depth; 0.035cm^3 volume. Character of Wound/Ulcer Post Debridement is stable. Severity of Tissue Post Debridement is: Fat layer exposed. Post procedure Diagnosis Wound #1: Same as Pre-Procedure Wound #2 Pre-procedure diagnosis of Wound #2 is a Diabetic Wound/Ulcer of the Lower Extremity  located on the Right Malleolus .Severity of Tissue Pre Debridement is: Fat layer exposed. There was a Skin/Subcutaneous Tissue Debridement ([id number]) debridement with total area of 1.56 sq cm performed by Evlyn Kanner, MD. with the following instrument(s): Curette to remove Non-Viable tissue/material including Exudate, Fat Layer (and Subcutaneous Tissue) Exposed, Fibrin/Slough, and Subcutaneous after achieving pain control using Lidocaine 4% Topical Solution. A time out was conducted at 13:34, prior to the start of the procedure. A Minimum amount of bleeding was controlled with Pressure. The procedure was tolerated well with a pain level of 0 throughout and a pain level of 0 following the procedure. Post Debridement Measurements: 1.2cm length x 1.3cm width x 0.1cm depth; 0.123cm^3 volume. Character of Wound/Ulcer Post Debridement is stable. Severity of Tissue Post Debridement is: Fat layer exposed. Post procedure Diagnosis Wound #2: Same as Pre-Procedure Plan Wound Cleansing: Wound #1 Left Malleolus: Walkins, Donta ([id number]) Cleanse wound with mild soap and water May Shower, gently pat wound dry prior to applying new dressing. May shower with protection. Wound #2 Right Malleolus: Cleanse wound with mild soap and water May Shower, gently pat wound dry prior to applying new dressing. May shower with protection. Wound #3 Right,Dorsal Toe Second: Cleanse wound with mild soap and water May Shower, gently pat wound dry prior to applying new dressing. May shower with protection. Primary Wound Dressing: Wound #2 Right Malleolus: Santyl Ointment Wound #1 Left Malleolus: Prisma Ag Secondary Dressing: Wound #1 Left Malleolus: Dry Gauze Boardered Foam Dressing Wound #2 Right Malleolus: Dry Gauze Boardered Foam Dressing Wound #3 Right,Dorsal Toe Second: Dry Gauze Boardered Foam Dressing Dressing Change Frequency: Wound #2 Right Malleolus: Change dressing every day. Wound #1 Left  Malleolus: Change dressing every other day. Follow-up Appointments: Wound #1 Left Malleolus: Return Appointment in 1 week. Wound #2 Right Malleolus: Return Appointment in 1 week. Wound #3 Right,Dorsal Toe Second: Return Appointment in 1 week. Additional Orders /  Instructions: Wound #1 Left Malleolus: Increase protein intake. Activity as tolerated Wound #2 Right Malleolus: Increase protein intake. Activity as tolerated Wound #3 Right,Dorsal Toe Second: Increase protein intake. Activity as tolerated Frentz, Harman ([id number]) this 71 year old diabetic has no recent documentation of her hemoglobin A1c but tells me her blood sugars have been very well controlled. After review of recommended: 1. Santyl ointment for the right lateral leg which she has already been using. 2. Silver alginate to be applied with a bordered foam to her left lateral ankle. 3. Good control of her diabetes mellitus 4. Adequate offloading of both these areas 5. Adequate protein, vitamin E, vitamin C and zinc 6. Regular visits to the wound center Electronic Signature(s) Signed: 07/08/2017 2:01:04 PM By: Evlyn Kanner MD, FACS Entered By: Evlyn Kanner on 07/08/2017 14:01:04 Jessica Donovan ([id number]) -------------------------------------------------------------------------------- ROS/PFSH Details Patient Name: Jessica Donovan Date of Service: 07/08/2017 12:45 PM Medical Record Number: [id number] Patient Account Number: [id number] Date of Birth/Sex: 04/27/1946 (70 y.o. Female) Treating RN: Afful, RN, BSN, [employer] Primary Care Provider: Garlon Hatchet Other Clinician: Referring Provider: Dorothea Glassman Treating Provider/Extender: Rudene Re in Treatment: 0 Information Obtained From Patient Caregiver Chart Wound History Do you currently have one or more open woundso Yes How many open wounds do you currently haveo 3 How have you been treating your wound(s) until nowo santyl Has your wound(s) ever  healed and then re-openedo No Have you had any lab work done in the past montho No Have you tested positive for an antibiotic resistant organism (MRSA, VRE)o No Have you tested positive for osteomyelitis (bone infection)o No Have you had any tests for circulation on your legso No Have you had other problems associated with your woundso Infection Eyes Complaints and Symptoms: Positive for: Glasses / Contacts Medical History: Negative for: Cataracts; Glaucoma; Optic Neuritis Integumentary (Skin) Complaints and Symptoms: Positive for: Wounds Medical History: Positive for: History of pressure wounds Negative for: History of Burn Psychiatric Complaints and Symptoms: Positive for: Anxiety Review of System Notes: depression Medical History: Positive for: Confinement Anxiety Negative for: Anorexia/bulimia Constitutional Symptoms (General Health) Hackleman, Shantara ([id number]) Complaints and Symptoms: No Complaints or Symptoms Ear/Nose/Mouth/Throat Complaints and Symptoms: No Complaints or Symptoms Medical History: Negative for: Chronic sinus problems/congestion; Middle ear problems Hematologic/Lymphatic Complaints and Symptoms: No Complaints or Symptoms Medical History: Negative for: Anemia; Hemophilia; Human Immunodeficiency Virus; Lymphedema; Sickle Cell Disease Respiratory Complaints and Symptoms: No Complaints or Symptoms Medical History: Positive for: Sleep Apnea Negative for: Aspiration; Asthma; Chronic Obstructive Pulmonary Disease (COPD); Pneumothorax; Tuberculosis Cardiovascular Medical History: Positive for: Coronary Artery Disease; Hypertension; Myocardial Infarction Gastrointestinal Complaints and Symptoms: No Complaints or Symptoms Medical History: Negative for: Cirrhosis ; Colitis; Crohnos; Hepatitis A; Hepatitis B; Hepatitis C Endocrine Complaints and Symptoms: No Complaints or Symptoms Medical History: Positive for: Type II Diabetes Time with diabetes:  20 Treated with: Insulin Vanleer, Carey ([id number]) Blood sugar tested every day: No Blood sugar testing results: Breakfast: 178 Genitourinary Complaints and Symptoms: No Complaints or Symptoms Immunological Complaints and Symptoms: No Complaints or Symptoms Medical History: Negative for: Lupus Erythematosus; Raynaudos; Scleroderma Musculoskeletal Complaints and Symptoms: No Complaints or Symptoms Medical History: Positive for: Osteoarthritis Negative for: Gout; Rheumatoid Arthritis; Osteomyelitis Neurologic Complaints and Symptoms: No Complaints or Symptoms Medical History: Positive for: Neuropathy Negative for: Dementia; Quadriplegia; Paraplegia; Seizure Disorder Oncologic Complaints and Symptoms: No Complaints or Symptoms Medical History: Negative for: Received Chemotherapy; Received Radiation Immunizations Pneumococcal Vaccine: Received Pneumococcal Vaccination: No Family and Social History Cancer: No; Diabetes: No; Heart Disease: No;  Hereditary Spherocytosis: No; Hypertension: No; Kidney Disease: No; Lung Disease: No; Seizures: No; Stroke: Yes - Mother; Thyroid Problems: No; Tuberculosis: No; Never smoker; Marital Status - Single; Alcohol Use: Never; Drug Use: No History; Caffeine Use: Never; Parlato, Nohemi ([id number]) Financial Concerns: No; Food, Clothing or Shelter Needs: No; Support System Lacking: No; Transportation Concerns: No; Advanced Directives: No; Patient does not want information on Advanced Directives; Living Will: No Physician Affirmation I have reviewed and agree with the above information. Electronic Signature(s) Signed: 07/08/2017 2:56:30 PM By: Elpidio EricAfful, Rita BSN, RN Signed: 07/08/2017 4:39:35 PM By: Evlyn KannerBritto, Star Resler MD, FACS Entered By: Evlyn KannerBritto, Ed Mandich on 07/08/2017 13:12:13 Jessica DanceGLASS, Amelie ([id number]) -------------------------------------------------------------------------------- SuperBill Details Patient Name: Jessica DanceGLASS, Ruthel Date of Service:  07/08/2017 Medical Record Number: [id number] Patient Account Number: [id number] Date of Birth/Sex: 1946/05/01 (71 y.o. 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 Facility Procedures CPT4 Code Description: 4696295276100138 99213 - WOUND CARE VISIT-LEV 3 EST PT Modifier: Quantity: 1 CPT4 Code Description: 8413244036100012 11042 - DEB SUBQ TISSUE 20 SQ CM/< ICD-10 Description Diagnosis E11.621 Type 2 diabetes mellitus with foot ulcer L97.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.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 left ankle  with fat l Modifier: layer expose ayer exposed Quantity: 1 d Physician Procedures CPT4 Code Description: 10272536770473 99204 - WC PHYS LEVEL 4 - NEW PT ICD-10 Description Diagnosis E11.621 Type 2 diabetes mellitus with foot ulcer L97.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.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 left ankle with  fat l Modifier: 25 layer expose ayer exposed Quantity: 1 d CPT4 Code Description: 66440346770168 11042 - WC PHYS SUBQ TISS 20 SQ CM ICD-10 Description Diagnosis E11.621 Type 2 diabetes mellitus with foot ulcer L97.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

## 2017-07-09 NOTE — Progress Notes (Signed)
Jessica Donovan, Jessica Donovan ([id number]) Visit Report for 07/08/2017 Abuse/Suicide Risk Screen Details Patient Name: Jessica Donovan, Jessica Donovan Date of Service: 07/08/2017 12:45 PM Medical Record Number: [id number] Patient Account Number: [id number] Date of Birth/Sex: [birth year] (71 y.o. Female) Treating RN: Afful, RN, BSN, [employer] Primary Care Quincy Prisco: Garlon HatchetMCCONVILLE, ROBERT Other Clinician: Referring Rollie Hynek: Dorothea GlassmanMALINDA, PAUL Treating Alainah Phang/Extender: Rudene ReBritto, Errol Weeks in Treatment: 0 Abuse/Suicide Risk Screen Items Answer ABUSE/SUICIDE RISK SCREEN: Has anyone close to you tried to hurt or harm you recentlyo No Do you feel uncomfortable with anyone in your familyo No Has anyone forced you do things that you didnot want to doo No Do you have any thoughts of harming yourselfo No Patient displays signs or symptoms of abuse and/or neglect. No Electronic Signature(s) Signed: 07/08/2017 12:48:31 PM By: Elpidio EricAfful, Rita BSN, RN Entered By: Elpidio EricAfful, Rita on 07/08/2017 12:48:31 Jessica Donovan, Jessica Donovan ([id number]) -------------------------------------------------------------------------------- Activities of Daily Living Details Patient Name: Jessica Donovan, Jessica Donovan Date of Service: 07/08/2017 12:45 PM Medical Record Number: [id number] Patient Account Number: [id number] Date of Birth/Sex: [birth year] (71 y.o. Female) Treating RN: Clover MealyAfful, RN, BSN, [employer] Primary Care Benjamim Harnish: Garlon HatchetMCCONVILLE, ROBERT Other Clinician: Referring Kamali Nephew: Dorothea GlassmanMALINDA, PAUL Treating Bethanie Bloxom/Extender: Rudene ReBritto, Errol Weeks in Treatment: 0 Activities of Daily Living Items Answer Activities of Daily Living (Please select one for each item) Drive Automobile Not Able Take Medications Completely Able Use Telephone Completely Able Care for Appearance Completely Able Use Toilet Completely Able Bath / Shower Completely Able Dress Self Completely Able Feed Self Completely Able Walk Need Assistance Get In / Out Bed Need Assistance Housework Need Assistance Prepare Meals Need  Assistance Handle Money Completely Able Shop for Self Completely Able Electronic Signature(s) Signed: 07/08/2017 12:49:06 PM By: Elpidio EricAfful, Rita BSN, RN Entered By: Elpidio EricAfful, Rita on 07/08/2017 12:49:06 Jessica Donovan, Jessica Donovan ([id number]) -------------------------------------------------------------------------------- Education Assessment Details Patient Name: Jessica Donovan, Jessica Donovan Date of Service: 07/08/2017 12:45 PM Medical Record Number: [id number] Patient Account Number: [id number] Date of Birth/Sex: [birth year] (70 y.o. Female) Treating RN: Clover MealyAfful, RN, BSN, [employer] Primary Care Umaima Scholten: Garlon HatchetMCCONVILLE, ROBERT Other Clinician: Referring Athan Casalino: Dorothea GlassmanMALINDA, PAUL Treating Nikole Swartzentruber/Extender: Rudene ReBritto, Errol Weeks in Treatment: 0 Primary Learner Assessed: Patient Learning Preferences/Education Level/Primary Language Learning Preference: Explanation Highest Education Level: High School Preferred Language: English Cognitive Barrier Assessment/Beliefs Language Barrier: No Physical Barrier Assessment Impaired Vision: Yes Glasses Knowledge/Comprehension Assessment Knowledge Level: Medium Comprehension Level: Medium Ability to understand written Medium instructions: Ability to understand verbal Medium instructions: Motivation Assessment Anxiety Level: Calm Cooperation: Cooperative Education Importance: Acknowledges Need Interest in Health Problems: Asks Questions Perception: Coherent Willingness to Engage in Self- Medium Management Activities: Readiness to Engage in Self- Medium Management Activities: Electronic Signature(s) Signed: 07/08/2017 12:49:34 PM By: Elpidio EricAfful, Rita BSN, RN Entered By: Elpidio EricAfful, Rita on 07/08/2017 12:49:34 Jessica Donovan, Jessica Donovan ([id number]) -------------------------------------------------------------------------------- Fall Risk Assessment Details Patient Name: Jessica Donovan, Jahna Date of Service: 07/08/2017 12:45 PM Medical Record Number: [id number] Patient Account Number: [id number] Date of  Birth/Sex: [birth year] (70 y.o. Female) Treating RN: Afful, RN, BSN, [employer] Primary Care Makendra Vigeant: Garlon HatchetMCCONVILLE, ROBERT Other Clinician: Referring Agapita Savarino: Dorothea GlassmanMALINDA, PAUL Treating [place] Silveria/Extender: Rudene ReBritto, Errol Weeks in Treatment: 0 Fall Risk Assessment Items Have you had 2 or more falls in the last 12 monthso 0 No Have you had any fall that resulted in injury in the last 12 monthso 0 No FALL RISK ASSESSMENT: History of falling - immediate or within 3 months 0 No Secondary diagnosis 0 No Ambulatory aid None/bed rest/wheelchair/nurse 0 No Crutches/cane/walker 15 Yes Furniture 0 No IV Access/Saline Lock 0 No Gait/Training Normal/bed rest/immobile 0 No Weak 10 Yes Impaired 20 Yes Mental Status Oriented  to own ability 0 Yes Electronic Signature(s) Signed: 07/08/2017 12:50:09 PM By: Elpidio Eric BSN, RN Entered By: Elpidio Eric on 07/08/2017 12:50:09 Jessica Dance ([id number]) -------------------------------------------------------------------------------- Foot Assessment Details Patient Name: Jessica Dance Date of Service: 07/08/2017 12:45 PM Medical Record Number: [id number] Patient Account Number: [id number] Date of Birth/Sex: June 07, 1946 (70 y.o. Female) Treating RN: Afful, RN, BSN, [employer] Primary Care Jessabelle Markiewicz: Garlon Hatchet Other Clinician: Referring Somaya Grassi: Dorothea Glassman Treating Ramesha Poster/Extender: Rudene Re in Treatment: 0 Foot Assessment Items Site Locations + = Sensation present, - = Sensation absent, C = Callus, U = Ulcer R = Redness, W = Warmth, M = Maceration, PU = Pre-ulcerative lesion F = Fissure, S = Swelling, D = Dryness Assessment Right: Left: Other Deformity: No No Prior Foot Ulcer: No No Prior Amputation: No No Charcot Joint: No No Ambulatory Status: Ambulatory With Help Assistance Device: Walker Gait: Surveyor, mining) Signed: 07/08/2017 12:51:25 PM By: Elpidio Eric BSN, RN Entered By: Elpidio Eric on 07/08/2017 12:51:25 Jessica Dance ([id number]) -------------------------------------------------------------------------------- Nutrition Risk Assessment Details Patient Name: Jessica Dance Date of Service: 07/08/2017 12:45 PM Medical Record Number: [id number] Patient Account Number: [id number] Date of Birth/Sex: 1946/08/02 (70 y.o. Female) Treating RN: Clover Mealy, RN, BSN, [employer] Primary Care Serra Younan: Garlon Hatchet Other Clinician: Referring Frederick Marro: Dorothea Glassman Treating Emmersyn Kratzke/Extender: Rudene Re in Treatment: 0 Height (in): Weight (lbs): Body Mass Index (BMI): Nutrition Risk Assessment Items NUTRITION RISK SCREEN: I have an illness or condition that made me change the kind and/or 0 No amount of food I eat I eat fewer than two meals per day 0 No I eat few fruits and vegetables, or milk products 0 No I have three or more drinks of beer, liquor or wine almost every day 0 No I have tooth or mouth problems that make it hard for me to eat 0 No I don't always have enough money to buy the food I need 0 No I eat alone most of the time 0 No I take three or more different prescribed or over-the-counter drugs a 0 No day Without wanting to, I have lost or gained 10 pounds in the last six 0 No months I am not always physically able to shop, cook and/or feed myself 0 No Nutrition Protocols Good Risk Protocol 0 No interventions needed Moderate Risk Protocol Electronic Signature(s) Signed: 07/08/2017 12:50:20 PM By: Elpidio Eric BSN, RN Entered By: Elpidio Eric on 07/08/2017 12:50:19

## 2017-07-12 ENCOUNTER — Other Ambulatory Visit: Payer: Self-pay | Admitting: *Deleted

## 2017-07-12 NOTE — Patient Outreach (Signed)
Successful telephone encounter to pt for final transition of care call- ongoing follow up on recent hospitalization June 20-21 for chest pain.  Spoke with pt, HIPAA identifiers provided.   Pt reports to see PCP Dr. Emilia BeckMcConville tomorrow and  Wound Care MD again 07/15/17- per MD to see weekly for 8-12 weeks, concern about right foot ankle.  Pt reports on recent visit with Wound care MD 07/08/17 - right ankle debrided, keeping  Wounds clean/doing daily dressing changes on both ankles/left ankle healing good, need more dressings.   Pt reports today right ankle has redness around the wound, pain worse today, drainage clear, no swelling.  Pt also reports right foot toe (second) top of knuckle looks red.   RN CM discussed with pt signs/symptoms of infection, need to call wound care MD to report redness/increased pain to which pt said she would do.  Pt reports having chest pain a couple times- took Nitroglycerin SL each time, with relief after second tablet.  RN CM discussed with pt today final transition of care call, plan to continue to follow up in 2 weeks telephonically- check on status.    Plan:  As discussed, pt to call Wound care MD report redness/increased pain  in right outer ankle/redness to second toe/right foot.             As discussed with pt, plan to follow up again in 2 weeks- check on status.            Transition of care program ended/care plan updated.   [name] M.   Arabella Revelle RN CCM [facility]  [phone]

## 2017-07-13 DIAGNOSIS — F331 Major depressive disorder, recurrent, moderate: Secondary | ICD-10-CM | POA: Diagnosis not present

## 2017-07-13 DIAGNOSIS — M545 Low back pain: Secondary | ICD-10-CM | POA: Diagnosis not present

## 2017-07-13 DIAGNOSIS — F411 Generalized anxiety disorder: Secondary | ICD-10-CM | POA: Diagnosis not present

## 2017-07-13 DIAGNOSIS — K5732 Diverticulitis of large intestine without perforation or abscess without bleeding: Secondary | ICD-10-CM | POA: Diagnosis not present

## 2017-07-13 DIAGNOSIS — M5441 Lumbago with sciatica, right side: Secondary | ICD-10-CM | POA: Diagnosis not present

## 2017-07-13 DIAGNOSIS — E1140 Type 2 diabetes mellitus with diabetic neuropathy, unspecified: Secondary | ICD-10-CM | POA: Diagnosis not present

## 2017-07-13 DIAGNOSIS — I2510 Atherosclerotic heart disease of native coronary artery without angina pectoris: Secondary | ICD-10-CM | POA: Diagnosis not present

## 2017-07-13 DIAGNOSIS — M5137 Other intervertebral disc degeneration, lumbosacral region: Secondary | ICD-10-CM | POA: Diagnosis not present

## 2017-07-13 DIAGNOSIS — K5909 Other constipation: Secondary | ICD-10-CM | POA: Diagnosis not present

## 2017-07-14 ENCOUNTER — Other Ambulatory Visit: Payer: Self-pay

## 2017-07-14 MED ORDER — PEG 3350-KCL-NA BICARB-NACL 420 G PO SOLR: 4000.0000 mL | Freq: Once | ORAL | 0 refills | Status: AC

## 2017-07-15 ENCOUNTER — Encounter: Payer: PPO | Admitting: Surgery

## 2017-07-15 DIAGNOSIS — E11622 Type 2 diabetes mellitus with other skin ulcer: Secondary | ICD-10-CM | POA: Diagnosis not present

## 2017-07-15 DIAGNOSIS — L97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 layer exposed: Secondary | ICD-10-CM | POA: Diagnosis not present

## 2017-07-15 DIAGNOSIS — E11621 Type 2 diabetes mellitus with foot ulcer: Secondary | ICD-10-CM | POA: Diagnosis not present

## 2017-07-19 NOTE — Progress Notes (Signed)
Jessica Donovan, Jessica Donovan ([id number]) Visit Report for 07/15/2017 Chief Complaint Document Details Patient Name: Jessica Donovan, Jessica Donovan Date of Service: 07/15/2017 3:00 PM Medical Record Number: [id number] Patient Account Number: [id number] Date of Birth/Sex: [birth year] (71 y.o. Female) Treating RN: Afful, RN, BSN, [facility] Primary Care Provider: Garlon Hatchet Other Clinician: Referring Provider: Garlon Hatchet Treating Provider/Extender: Rudene Re in Treatment: 1 Information Obtained from: Patient Chief Complaint Patients presents for treatment of an open diabetic ulcer to the right ankle the left ankle and the right third toe for about a month Electronic Signature(s) Signed: 07/15/2017 3:40:59 PM By: Evlyn Kanner MD, FACS Entered By: Evlyn Kanner on 07/15/2017 15:40:59 Jessica Donovan ([id number]) -------------------------------------------------------------------------------- Debridement Details Patient Name: Jessica Donovan Date of Service: 07/15/2017 3:00 PM Medical Record Number: [id number] Patient Account Number: [id number] Date of Birth/Sex: November 12, 1946 (71 y.o. Female) Treating RN: Afful, RN, BSN, [employer] Primary Care Provider: Garlon Hatchet Other Clinician: Referring Provider: Garlon Hatchet Treating Provider/Extender: Rudene Re in Treatment: 1 Debridement Performed for Wound #2 Right Malleolus Assessment: Performed By: Physician Evlyn Kanner, MD Debridement: Debridement Severity of Tissue Pre Fat layer exposed Debridement: Pre-procedure Verification/Time Out Yes - 15:28 Taken: Start Time: 15:28 Pain Control: Lidocaine 4% Topical Solution Level: Skin/Subcutaneous Tissue Total Area Debrided (L x 0.8 (cm) x 0.8 (cm) = 0.64 (cm) W): Tissue and other Non-Viable, Fat, Fibrin/Slough, Subcutaneous material debrided: Instrument: Curette Bleeding: Minimum Hemostasis Achieved: Pressure End Time: 15:29 Procedural Pain: 0 Post Procedural Pain: 0 Response  to Treatment: Procedure was tolerated well Post Debridement Measurements of Total Wound Length: (cm) 0.8 Width: (cm) 0.8 Depth: (cm) 0.1 Volume: (cm) 0.05 Character of Wound/Ulcer Post Stable Debridement: Severity of Tissue Post Debridement: Fat layer exposed Post Procedure Diagnosis Same as Pre-procedure Electronic Signature(s) Signed: 07/15/2017 3:38:57 PM By: Evlyn Kanner MD, FACS Signed: 07/15/2017 4:40:57 PM By: Elpidio Eric BSN, RN [place], Jessica Donovan ([id number]) Entered By: Evlyn Kanner on 07/15/2017 15:38:57 Jessica Donovan ([id number]) -------------------------------------------------------------------------------- HPI Details Patient Name: Jessica Donovan Date of Service: 07/15/2017 3:00 PM Medical Record Number: [id number] Patient Account Number: [id number] Date of Birth/Sex: 1946-05-31 (71 y.o. Female) Treating RN: Afful, RN, BSN, [employer] Primary Care Provider: Garlon Hatchet Other Clinician: Referring Provider: Garlon Hatchet Treating Provider/Extender: Rudene Re in Treatment: 1 History of Present Illness HPI Description: 71 year old diabetic patient who has chronic back pain collagen vascular disease coronary artery disease hypertension and history of osteomyelitis of the toe in February 2017. 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 She also has had joint replacement surgery in the past. she was recently reviewed by the pH and care management team and had a problem with the toe on her right foot and her left ankle had minimal drainage in the right ankle was draining worse. she was seen in the ER on 06/22/2017, and on the x-ray was no evidence of any acute bony abnormality. Electronic Signature(s) Signed: 07/15/2017 3:41:05 PM By: Evlyn Kanner MD, FACS Entered By: Evlyn Kanner on 07/15/2017 15:41:05 Jessica Donovan ([id number]) -------------------------------------------------------------------------------- Physical Exam  Details Patient Name: Jessica Donovan Date of Service: 07/15/2017 3:00 PM Medical Record Number: [id number] Patient Account Number: [id number] Date of Birth/Sex: 1946-08-21 (71 y.o. Female) Treating RN: Clover Mealy, RN, BSN, [employer] Primary Care Provider: Garlon Hatchet Other Clinician: Referring Provider: Garlon Hatchet Treating Provider/Extender: Rudene Re in Treatment: 1 Constitutional . Pulse regular. Respirations normal and unlabored. Afebrile. . Eyes Nonicteric. Reactive to light. Ears, Nose, Mouth, and Throat Lips, teeth, and gums WNL.[name] Moist mucosa without lesions. Neck supple and  nontender. No palpable supraclavicular or cervical adenopathy. Normal sized without goiter. Respiratory WNL. No retractions.. Cardiovascular Pedal Pulses WNL. No clubbing, cyanosis or edema. Lymphatic No adneopathy. No adenopathy. No adenopathy. Musculoskeletal Adexa without tenderness or enlargement.. Digits and nails w/o clubbing, cyanosis, infection, petechiae, ischemia, or inflammatory conditions.. Integumentary (Hair, Skin) No suspicious lesions. No crepitus or fluctuance. No peri-wound warmth or erythema. No masses.[name]. Psychiatric Judgement and insight Intact.. No evidence of depression, anxiety, or agitation.. Notes the right lateral malleolus had to be sharply debrided with a #3 curet and minimal bleeding controlled with pressure and left lateral malleolus is looking very clean and needed no sharp debridement. Electronic Signature(s) Signed: 07/15/2017 3:41:48 PM By: Evlyn KannerBritto, Consuelo Suthers MD, FACS Entered By: Evlyn KannerBritto, Shikira Folino on 07/15/2017 15:41:47 Jessica Donovan, Jessica Donovan ([id number]) -------------------------------------------------------------------------------- Physician Orders Details Patient Name: Jessica Donovan, Jessica Donovan Date of Service: 07/15/2017 3:00 PM Medical Record Number: [id number] Patient Account Number: [id number] Date of Birth/Sex: May 04, 1946 (71 y.o. Female) Treating RN: Afful, RN, BSN,  [employer] Primary Care Provider: Garlon HatchetMCCONVILLE, ROBERT Other Clinician: Referring Provider: Garlon HatchetMCCONVILLE, ROBERT Treating Provider/Extender: Rudene ReBritto, Thania Woodlief Weeks in Treatment: 1 Verbal / Phone Orders: No Diagnosis Coding Wound Cleansing Wound #1 Left Malleolus o Cleanse wound with mild soap and water o May Shower, gently pat wound dry prior to applying new dressing. o May shower with protection. Wound #2 Right Malleolus o Cleanse wound with mild soap and water o May Shower, gently pat wound dry prior to applying new dressing. o May shower with protection. Wound #3 Right,Dorsal Toe Second o Cleanse wound with mild soap and water o May Shower, gently pat wound dry prior to applying new dressing. o May shower with protection. Primary Wound Dressing Wound #1 Left Malleolus o Prisma Ag Wound #2 Right Malleolus o Santyl Ointment Secondary Dressing Wound #1 Left Malleolus o Dry Gauze o Boardered Foam Dressing Wound #2 Right Malleolus o Dry Gauze o Boardered Foam Dressing Wound #3 Right,Dorsal Toe Second o Dry Gauze o Boardered Foam Dressing Dietrick, Konnie ([id number]) Dressing Change Frequency Wound #1 Left Malleolus o Change dressing every other day. Wound #2 Right Malleolus o Change dressing every day. Follow-up Appointments Wound #1 Left Malleolus o Return Appointment in 1 week. Wound #2 Right Malleolus o Return Appointment in 1 week. Wound #3 Right,Dorsal Toe Second o Return Appointment in 1 week. Additional Orders / Instructions Wound #1 Left Malleolus o Increase protein intake. o Activity as tolerated Wound #2 Right Malleolus o Increase protein intake. o Activity as tolerated Wound #3 Right,Dorsal Toe Second o Increase protein intake. o Activity as tolerated Electronic Signature(s) Signed: 07/15/2017 3:53:48 PM By: Evlyn KannerBritto, Cyndi Montejano MD, FACS Signed: 07/15/2017 4:40:57 PM By: Elpidio EricAfful, Rita BSN, RN Entered By: Elpidio EricAfful, Rita  on 07/15/2017 15:31:39 Brunette, [name] ([id number]) -------------------------------------------------------------------------------- Problem List Details Patient Name: Jessica Donovan, Kyria Date of Service: 07/15/2017 3:00 PM Medical Record Number: [id number] Patient Account Number: [id number] Date of Birth/Sex: May 04, 1946 (71 y.o. 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 Female) Treating RN: Afful, RN, BSN, [employer] Primary Care Provider: Garlon HatchetMCCONVILLE, ROBERT Other Clinician: Referring Provider: Garlon HatchetMCCONVILLE, ROBERT Treating Provider/Extender: Rudene ReBritto, Marquist Binstock Weeks in Treatment: 1 Subjective Chief Complaint Information obtained from Patient Patients presents for treatment of an open diabetic ulcer to the right ankle the left ankle and the right third toe for about a month History of Present Illness (HPI) 71 year old diabetic patient who has chronic back pain collagen vascular disease coronary artery disease hypertension and history of osteomyelitis of the toe in February  2017. 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 She also has had joint replacement surgery in the past. she was recently reviewed by the pH and care management team and had a problem with the toe on her right foot and her left ankle had minimal drainage in the right ankle was draining worse. she was seen in the ER on 06/22/2017, and on the x-ray was no evidence of any acute bony abnormality. Objective Constitutional Pulse regular. Respirations normal and unlabored. Afebrile. Vitals Time Taken: 3:24 PM, Height: 66 in, Weight: 191 lbs, BMI: 30.8, Temperature: 97.6 F, Pulse: 75 bpm, Respiratory Rate: 16 breaths/min, Blood Pressure: 132/100 mmHg. Eyes Nonicteric. Reactive to light. Ears, Nose, Mouth, and Throat Lips, teeth, and gums WNL.[name]. Moist mucosa without lesions. Neck supple and nontender. No palpable supraclavicular or cervical adenopathy. Normal sized without goiter. [place], [name] ([id number]) Respiratory WNL. No retractions.. Cardiovascular Pedal Pulses WNL. No clubbing, cyanosis or edema. Lymphatic No adneopathy. No adenopathy. No adenopathy. Musculoskeletal Adexa without tenderness or enlargement.. Digits and nails w/o clubbing, cyanosis, infection, petechiae, ischemia, or inflammatory conditions.[name]. Psychiatric Judgement and insight Intact.. No evidence of depression, anxiety, or agitation.. General Notes: the right lateral malleolus had to be sharply debrided with a #3 curet and minimal bleeding controlled with pressure and left lateral malleolus is looking very clean and needed no sharp debridement. Integumentary (Hair, Skin) No suspicious lesions. No crepitus or fluctuance. No peri-wound warmth or erythema. No masses.. Wound #1 status is Open. Original cause of wound was Gradually Appeared. The wound is located on the Left Malleolus. The wound measures 0.3cm length x 0.5cm width x 0.1cm depth; 0.118cm^2 area and 0.012cm^3 volume. Wound #2  status is Open. Original cause of wound was Gradually Appeared. The wound is located on the Right Malleolus. The wound measures 0.8cm length x 0.8cm width x 0.1cm depth; 0.503cm^2 area and 0.05cm^3 volume. Wound #3 status is Open. Original cause of wound was Gradually Appeared. The wound is located on the Right,Dorsal Toe Second. The wound measures 0.1cm length x 0.1cm width x 0.1cm depth; 0.008cm^2 area and 0.001cm^3 volume. 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 There was a Skin/Subcutaneous Tissue Debridement ([id number]) debridement with total area of 0.64 sq cm performed by Evlyn KannerBritto, Hien Perreira, MD. with the following instrument(s): Curette to remove Non-Viable tissue/material including Fat Layer (and Subcutaneous Tissue) Exposed, Fibrin/Slough, and Subcutaneous after achieving pain control using Lidocaine 4% Topical Solution. A time out was conducted at 15:28, prior to the start  of the procedure. A Minimum amount of bleeding was controlled with Pressure. The procedure was tolerated well with a pain level of 0 throughout and a pain level of 0 following the procedure. Post Debridement Measurements: 0.8cm length x 0.8cm width x 0.1cm depth; 0.05cm^3 volume. Character of Wound/Ulcer Post Debridement is stable. Severity of Tissue Post Debridement is: Fat layer exposed. Post procedure Diagnosis Wound #2: Same as Pre-Procedure Plan Wound Cleansing: Wound #1 Left Malleolus: Cleanse wound with mild soap and water May Shower, gently pat wound dry prior to applying new dressing. May  shower with protection. Wound #2 Right Malleolus: Cleanse wound with mild soap and water May Shower, gently pat wound dry prior to applying new dressing. May shower with protection. Wound #3 Right,Dorsal Toe Second: Cleanse wound with mild soap and water May Shower, gently pat wound dry prior to applying new dressing. May shower with protection. Primary Wound Dressing: Wound #1 Left Malleolus: Prisma Ag Wound #2 Right Malleolus: Santyl Ointment Secondary Dressing: Wound #1 Left Malleolus: Dry Gauze Boardered Foam Dressing Wound #2 Right Malleolus: Dry Gauze Jessica Donovan, Jessica Donovan ([id number]) Boardered Foam Dressing Wound #3 Right,Dorsal Toe Second: Dry Gauze Boardered Foam Dressing Dressing Change Frequency: Wound #1 Left Malleolus: Change dressing every other day. Wound #2 Right Malleolus: Change dressing every day. Follow-up Appointments: Wound #1 Left Malleolus: Return Appointment in 1 week. Wound #2 Right Malleolus: Return Appointment in 1 week. Wound #3 Right,Dorsal Toe Second: Return Appointment in 1 week. Additional Orders / Instructions: Wound #1 Left Malleolus: Increase protein intake. Activity as tolerated Wound #2 Right Malleolus: Increase protein intake. Activity as tolerated Wound #3 Right,Dorsal Toe Second: Increase protein intake. Activity as tolerated After review of recommended: 1. Santyl ointment for the right lateral leg which she has already been using. 2. Silver alginate to be applied with a bordered foam to her left lateral ankle. 3. Good control of her diabetes mellitus 4. Adequate offloading of both these areas Electronic Signature(s) Signed: 07/15/2017 3:42:18 PM By: Evlyn Kanner MD, FACS Entered By: Evlyn Kanner on 07/15/2017 15:42:18 Jessica Donovan ([id number]) -------------------------------------------------------------------------------- SuperBill Details Patient Name: Jessica Donovan Date of Service: 07/15/2017 Medical Record Number:  [id number] Patient Account Number: [id number] Date of Birth/Sex: 09/12/1946 (71 y.o. 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 Facility Procedures CPT4 Code Description: [id number] 11042 - DEB SUBQ TISSUE 20 SQ CM/< ICD-10 Description Diagnosis E11.621 Type 2 diabetes mellitus with foot ulcer L97.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.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 left ankle  with fat l Modifier: layer expose ayer exposed Quantity: 1 d Physician Procedures CPT4 Code Description: [id number] 11042 - WC PHYS SUBQ TISS 20 SQ CM ICD-10 Description Diagnosis E11.621 Type 2 diabetes mellitus with foot ulcer L97.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.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 left ankle  with fat l Modifier: layer expose ayer exposed Quantity: 1 d Electronic Signature(s) Signed: 07/15/2017 3:42:33 PM By: Evlyn Kanner MD, FACS Entered By: Evlyn Kanner on 07/15/2017 15:42:33

## 2017-07-19 NOTE — Progress Notes (Addendum)
Jessica Donovan, Jessica Donovan ([id number]) Visit Report for 07/15/2017 Arrival Information Details Patient Name: Jessica Donovan, Jessica Donovan Date of Service: 07/15/2017 3:00 PM Medical Record Number: [id number] Patient Account Number: [id number] Date of Birth/Sex: 1946-09-08 (71 y.o. Female) Treating RN: Afful, RN, BSN, [employer] Primary Care Shealee Yordy: Garlon Hatchet Other Clinician: Referring Taleeyah Bora: Garlon Hatchet Treating Shontavia Mickel/Extender: Rudene Re in Treatment: 1 Visit Information History Since Last Visit All ordered tests and consults were completed: No Patient Arrived: Ambulatory Added or deleted any medications: No Arrival Time: 15:12 Any new allergies or adverse reactions: No Accompanied By: self Had a fall or experienced change in No Transfer Assistance: None activities of daily living that may affect Patient Identification Verified: Yes risk of falls: Secondary Verification Process Yes Signs or symptoms of abuse/neglect since last No Completed: visito Patient Requires Transmission- No Hospitalized since last visit: No Based Precautions: Has Dressing in Place as Prescribed: Yes Patient Has Alerts: Yes Pain Present Now: Yes Patient Alerts: Patient on Blood Thinner ASA Electronic Signature(s) Signed: 07/15/2017 3:12:51 PM By: Elpidio Eric BSN, RN Entered By: Elpidio Eric on 07/15/2017 15:12:51 Jessica Donovan ([id number]) -------------------------------------------------------------------------------- Encounter Discharge Information Details Patient Name: Jessica Donovan Date of Service: 07/15/2017 3:00 PM Medical Record Number: [id number] Patient Account Number: [id number] Date of Birth/Sex: 10/03/1946 (71 y.o. Female) Treating RN: Afful, RN, BSN, [employer] Primary Care Nadine Ryle: Garlon Hatchet Other Clinician: Referring Kristeena Meineke: Garlon Hatchet Treating Hetvi Shawhan/Extender: Rudene Re in Treatment: 1 Encounter Discharge Information Items Discharge Pain Level:  0 Discharge Condition: Stable Ambulatory Status: Walker Discharge Destination: Home Transportation: Private Auto Accompanied By: self Schedule Follow-up Appointment: No Medication Reconciliation completed No and provided to Patient/Care Magalie Almon: Provided on Clinical Summary of Care: 07/15/2017 Form Type Recipient Paper Patient BG Electronic Signature(s) Signed: 07/15/2017 4:40:31 PM By: Elpidio Eric BSN, RN Previous Signature: 07/15/2017 3:58:35 PM Version By: Gwenlyn Perking Entered By: Elpidio Eric on 07/15/2017 16:40:31 Dahmen, [name] ([id number]) -------------------------------------------------------------------------------- Lower Extremity Assessment Details Patient Name: Jessica Donovan Date of Service: 07/15/2017 3:00 PM Medical Record Number: [id number] Patient Account Number: [id number] Date of Birth/Sex: [birth year] (71 y.o. Female) Treating RN: Afful, RN, BSN, [employer] Primary Care Aradhana Gin: Garlon Hatchet Other Clinician: Referring Arriyana Rodell: Garlon Hatchet Treating Maksym Pfiffner/Extender: Rudene Re in Treatment: 1 Edema Assessment Assessed: [Left: No] [Right: No] Edema: [Left: No] [Right: No] Vascular Assessment Claudication: Claudication Assessment [Left:None] [Right:None] Pulses: Dorsalis Pedis Palpable: [Left:Yes] [Right:Yes] Posterior Tibial Extremity colors, hair growth, and conditions: Extremity Color: [Left:Normal] [Right:Normal] Temperature of Extremity: [Left:Warm] [Right:Warm] Capillary Refill: [Left:< 3 seconds] [Right:< 3 seconds] Toe Nail Assessment Left: Right: Thick: Yes Yes Discolored: Yes Deformed: No Improper Length and Hygiene: No Electronic Signature(s) Signed: 07/15/2017 4:40:57 PM By: Elpidio Eric BSN, RN Entered By: Elpidio Eric on 07/15/2017 15:24:09 Jessica Donovan ([id number]) -------------------------------------------------------------------------------- Multi Wound Chart Details Patient Name: Jessica Donovan Date of Service:  07/15/2017 3:00 PM Medical Record Number: [id number] Patient Account Number: [id number] Date of Birth/Sex: 08-Nov-1946 (71 y.o. Female) Treating RN: Clover Mealy, RN, BSN, [employer] Primary Care Wladyslawa Disbro: Garlon Hatchet Other Clinician: Referring Beauty Pless: Garlon Hatchet Treating Lamarco Gudiel/Extender: Rudene Re in Treatment: 1 Vital Signs Height(in): 66 Pulse(bpm): 75 Weight(lbs): 191 Blood Pressure 132/100 (mmHg): Body Mass Index(BMI): 31 Temperature(F): 97.6 Respiratory Rate 16 (breaths/min): Photos: [1:No Photos] [2:No Photos] [3:No Photos] Wound Location: [1:Left Malleolus] [2:Right Malleolus] [3:Right, Dorsal Toe Second] Wounding Event: [1:Gradually Appeared] [2:Gradually Appeared] [3:Gradually Appeared] Primary Etiology: [1:Diabetic Wound/Ulcer of the Lower Extremity] [2:Diabetic Wound/Ulcer of the Lower Extremity] [3:Diabetic Wound/Ulcer of the Lower Extremity] Date Acquired: [1:02/17/2017] [2:02/17/2017] [3:02/17/2017] Weeks of Treatment: [1:1] [2:1] [  3:1] Wound Status: [1:Open] [2:Open] [3:Open] Measurements L x W x D 0.3x0.5x0.1 [2:0.8x0.8x0.1] [3:0.1x0.1x0.1] (cm) Area (cm) : [1:0.118] [2:0.503] [3:0.008] Volume (cm) : [1:0.012] [2:0.05] [3:0.001] % Reduction in Area: [1:66.60%] [2:58.90%] [3:97.10%] % Reduction in Volume: 65.70% [2:59.30%] [3:96.30%] Classification: [1:Grade 1] [2:Grade 1] [3:Grade 1] Debridement: [1:N/A] [2:Debridement (11042- 11047)] [3:N/A] Pre-procedure [1:N/A] [2:15:28] [3:N/A] Verification/Time Out Taken: Pain Control: [1:N/A] [2:Lidocaine 4% Topical Solution] [3:N/A] Tissue Debrided: [1:N/A] [2:Fibrin/Slough, Fat, Subcutaneous] [3:N/A] Level: [1:N/A] [2:Skin/Subcutaneous Tissue] [3:N/A] Debridement Area (sq [1:N/A] [2:0.64] [3:N/A] cm): Instrument: [1:N/A] [2:Curette] [3:N/A] Bleeding: [1:N/A] [2:Minimum] [3:N/A] Hemostasis Achieved: N/A Pressure N/A Procedural Pain: N/A 0 N/A Post Procedural Pain: N/A 0 N/A Debridement Treatment  N/A Procedure was tolerated N/A Response: well Post Debridement N/A 0.8x0.8x0.1 N/A Measurements L x W x D (cm) Post Debridement N/A 0.05 N/A Volume: (cm) Periwound Skin Texture: No Abnormalities Noted No Abnormalities Noted No Abnormalities Noted Periwound Skin No Abnormalities Noted No Abnormalities Noted No Abnormalities Noted Moisture: Periwound Skin Color: No Abnormalities Noted No Abnormalities Noted No Abnormalities Noted Tenderness on No No No Palpation: Procedures Performed: N/A Debridement N/A Treatment Notes Electronic Signature(s) Signed: 07/15/2017 3:38:46 PM By: Evlyn KannerBritto, Errol MD, FACS Entered By: Evlyn KannerBritto, Errol on 07/15/2017 15:38:46 Jessica DanceGLASS, Jessica Donovan ([id number]) -------------------------------------------------------------------------------- Multi-Disciplinary Care Plan Details Patient Name: Jessica DanceGLASS, Jessica Donovan Date of Service: 07/15/2017 3:00 PM Medical Record Number: [id number] Patient Account Number: [id number] Date of Birth/Sex: 1946/01/22 (70 y.o. Female) Treating RN: Afful, RN, BSN, [employer] Primary Care Mattilyn Crites: Garlon HatchetMCCONVILLE, ROBERT Other Clinician: Referring Jaedin Regina: Garlon HatchetMCCONVILLE, ROBERT Treating Leaira Fullam/Extender: Rudene ReBritto, Errol Weeks in Treatment: 1 Active Inactive ` Orientation to the Wound Care Program Nursing Diagnoses: Knowledge deficit related to the wound healing center program Goals: Patient/caregiver will verbalize understanding of the Wound Healing Center Program Date Initiated: 07/08/2017 Target Resolution Date: 10/08/2017 Goal Status: Active Interventions: Provide education on orientation to the wound center Notes: ` Peripheral Neuropathy Nursing Diagnoses: Knowledge deficit related to disease process and management of peripheral neurovascular dysfunction Potential alteration in peripheral tissue perfusion (select prior to confirmation of diagnosis) Goals: Patient/caregiver will verbalize understanding of disease process and disease management Date  Initiated: 07/08/2017 Target Resolution Date: 10/08/2017 Goal Status: Active Interventions: Assess signs and symptoms of neuropathy upon admission and as needed Provide education on Management of Neuropathy and Related Ulcers Provide education on Management of Neuropathy upon discharge from the Wound Center Treatment Activities: Patient referred for customized footwear/offloading : 07/08/2017 Patient referred to diabetes educator : 07/08/2017 Jessica DanceGLASS, Jessica Donovan ([id number]) Notes: ` Wound/Skin Impairment Nursing Diagnoses: Impaired tissue integrity Knowledge deficit related to ulceration/compromised skin integrity Goals: Patient/caregiver will verbalize understanding of skin care regimen Date Initiated: 07/08/2017 Target Resolution Date: 10/08/2017 Goal Status: Active Ulcer/skin breakdown will have a volume reduction of 30% by week 4 Date Initiated: 07/08/2017 Target Resolution Date: 10/08/2017 Goal Status: Active Ulcer/skin breakdown will have a volume reduction of 50% by week 8 Date Initiated: 07/08/2017 Target Resolution Date: 10/08/2017 Goal Status: Active Ulcer/skin breakdown will have a volume reduction of 80% by week 12 Date Initiated: 07/08/2017 Target Resolution Date: 10/08/2017 Goal Status: Active Ulcer/skin breakdown will heal within 14 weeks Date Initiated: 07/08/2017 Target Resolution Date: 10/08/2017 Goal Status: Active Interventions: Assess patient/caregiver ability to obtain necessary supplies Assess patient/caregiver ability to perform ulcer/skin care regimen upon admission and as needed Assess ulceration(s) every visit Treatment Activities: Referred to DME Burhan Barham for dressing supplies : 07/08/2017 Skin care regimen initiated : 07/08/2017 Topical wound management initiated : 07/08/2017 Notes: Electronic Signature(s) Signed: 07/15/2017 4:40:57 PM By: Elpidio EricAfful, Rita BSN, RN Entered  ByElpidio Eric: Afful, Rita on 07/15/2017 15:29:32 Hassing, Britta MccreedyBARBARA  ([id number]) -------------------------------------------------------------------------------- Pain Assessment Details Patient Name: Jessica DanceGLASS, Jessica Donovan Date of Service: 07/15/2017 3:00 PM Medical Record Number: [id number] Patient Account Number: [id number] Date of Birth/Sex: 04/22/1946 (71 y.o. Female) Treating RN: Afful, RN, BSN, [employer] Primary Care Macil Crady: Garlon HatchetMCCONVILLE, ROBERT Other Clinician: Referring Lillith Mcneff: Garlon HatchetMCCONVILLE, ROBERT Treating Alessander Sikorski/Extender: Rudene ReBritto, Errol Weeks in Treatment: 1 Active Problems Location of Pain Severity and Description of Pain Patient Has Paino Yes Site Locations Pain Location: Pain in Ulcers With Dressing Change: No Rate the pain. Current Pain Level: 3 Character of Pain Describe the Pain: Tender Pain Management and Medication Current Pain Management: Electronic Signature(s) Signed: 07/15/2017 3:13:07 PM By: Elpidio EricAfful, Rita BSN, RN Entered By: Elpidio EricAfful, Rita on 07/15/2017 15:13:07 Jessica DanceGLASS, Jessica Donovan ([id number]) -------------------------------------------------------------------------------- Patient/Caregiver Education Details Patient Name: Jessica DanceGLASS, Jessica Donovan Date of Service: 07/15/2017 3:00 PM Medical Record Number: [id number] Patient Account Number: [id number] Date of Birth/Gender: 04/22/1946 (71 y.o. Female) Treating RN: Clover MealyAfful, RN, BSN, [employer] Primary Care Physician: Garlon HatchetMCCONVILLE, ROBERT Other Clinician: Referring Physician: Garlon HatchetMCCONVILLE, ROBERT Treating Physician/Extender: Rudene ReBritto, Errol Weeks in Treatment: 1 Education Assessment Education Provided To: Patient Education Topics Provided Peripheral Neuropathy: Methods: Explain/Verbal Responses: State content correctly Welcome To The Wound Care Center: Methods: Explain/Verbal Responses: State content correctly Electronic Signature(s) Signed: 07/15/2017 4:40:57 PM By: Elpidio EricAfful, Rita BSN, RN Entered By: Elpidio EricAfful, Rita on 07/15/2017 16:40:44 Lafata, Britta MccreedyBARBARA  ([id number]) -------------------------------------------------------------------------------- Wound Assessment Details Patient Name: Jessica DanceGLASS, Jessica Donovan Date of Service: 07/15/2017 3:00 PM Medical Record Number: [id number] Patient Account Number: [id number] Date of Birth/Sex: 04/22/1946 (70 y.o. Female) Treating RN: Afful, RN, BSN, [employer] Primary Care Japleen Tornow: Garlon HatchetMCCONVILLE, ROBERT Other Clinician: Referring Chaze Hruska: Garlon HatchetMCCONVILLE, ROBERT Treating Saint Hank/Extender: Rudene ReBritto, Errol Weeks in Treatment: 1 Wound Status Wound Number: 1 Primary Diabetic Wound/Ulcer of the Lower Etiology: Extremity Wound Location: Left Malleolus Wound Status: Open Wounding Event: Gradually Appeared Date Acquired: 02/17/2017 Weeks Of Treatment: 1 Clustered Wound: No Photos Photo Uploaded By: Elpidio EricAfful, Rita on 07/15/2017 16:47:20 Wound Measurements Length: (cm) 0.3 Width: (cm) 0.5 Depth: (cm) 0.1 Area: (cm) 0.118 Volume: (cm) 0.012 % Reduction in Area: 66.6% % Reduction in Volume: 65.7% Wound Description Classification: Grade 1 Periwound Skin Texture Texture Color No Abnormalities Noted: No No Abnormalities Noted: No Coomer, Jerrilyn ([id number]) Moisture No Abnormalities Noted: No Treatment Notes Wound #1 (Left Malleolus) 1. Cleansed with: Clean wound with Normal Saline 4. Dressing Applied: Prisma Ag Santyl Ointment 5. Secondary Dressing Applied Bordered Foam Dressing Dry Gauze Electronic Signature(s) Signed: 07/15/2017 4:40:57 PM By: Elpidio EricAfful, Rita BSN, RN Entered By: Elpidio EricAfful, Rita on 07/15/2017 15:22:33 Jessica DanceGLASS, Jessica Donovan ([id number]) -------------------------------------------------------------------------------- Wound Assessment Details Patient Name: Jessica DanceGLASS, Jessica Donovan Date of Service: 07/15/2017 3:00 PM Medical Record Number: [id number] Patient Account Number: [id number] Date of Birth/Sex: 04/22/1946 (70 y.o. Female) Treating RN: Afful, RN, BSN, [employer] Primary Care Kyshawn Teal: Garlon HatchetMCCONVILLE, ROBERT Other  Clinician: Referring Griffin Dewilde: Garlon HatchetMCCONVILLE, ROBERT Treating Joci Dress/Extender: Rudene ReBritto, Errol Weeks in Treatment: 1 Wound Status Wound Number: 2 Primary Diabetic Wound/Ulcer of the Lower Etiology: Extremity Wound Location: Right Malleolus Wound Status: Open Wounding Event: Gradually Appeared Date Acquired: 02/17/2017 Weeks Of Treatment: 1 Clustered Wound: No Photos Photo Uploaded By: Elpidio EricAfful, Rita on 07/15/2017 16:47:51 Wound Measurements Length: (cm) 0.8 Width: (cm) 0.8 Depth: (cm) 0.1 Area: (cm) 0.503 Volume: (cm) 0.05 % Reduction in Area: 58.9% % Reduction in Volume: 59.3% Wound Description Classification: Grade 1 Periwound Skin Texture Texture Color No Abnormalities Noted: No No Abnormalities Noted: No Viner, Lark ([id number]) Moisture No Abnormalities Noted: No Treatment Notes Wound #2 (Right Malleolus) 1. Cleansed with: Clean wound with  Normal Saline 4. Dressing Applied: Prisma Ag Santyl Ointment 5. Secondary Dressing Applied Bordered Foam Dressing Dry Gauze Electronic Signature(s) Signed: 07/15/2017 4:40:57 PM By: Elpidio Eric BSN, RN Entered By: Elpidio Eric on 07/15/2017 15:22:33 Jessica Donovan ([id number]) -------------------------------------------------------------------------------- Wound Assessment Details Patient Name: Jessica Donovan Date of Service: 07/15/2017 3:00 PM Medical Record Number: [id number] Patient Account Number: [id number] Date of Birth/Sex: [birth year] (70 y.o. Female) Treating RN: Afful, RN, BSN, [employer] Primary Care Jakory Matsuo: Garlon Hatchet Other Clinician: Referring Kinzee Happel: Garlon Hatchet Treating Malikhi Ogan/Extender: Rudene Re in Treatment: 1 Wound Status Wound Number: 3 Primary Diabetic Wound/Ulcer of the Lower Etiology: Extremity Wound Location: Right, Dorsal Toe Second Wound Status: Open Wounding Event: Gradually Appeared Date Acquired: 02/17/2017 Weeks Of Treatment: 1 Clustered Wound: No Photos Photo  Uploaded By: Elpidio Eric on 07/15/2017 16:47:52 Wound Measurements Length: (cm) 0.1 Width: (cm) 0.1 Depth: (cm) 0.1 Area: (cm) 0.008 Volume: (cm) 0.001 % Reduction in Area: 97.1% % Reduction in Volume: 96.3% Wound Description Classification: Grade 1 Periwound Skin Texture Texture Color No Abnormalities Noted: No No Abnormalities Noted: No Kats, Christabell ([id number]) Moisture No Abnormalities Noted: No Treatment Notes Wound #3 (Right, Dorsal Toe Second) 1. Cleansed with: Clean wound with Normal Saline 4. Dressing Applied: Prisma Ag Santyl Ointment 5. Secondary Dressing Applied Bordered Foam Dressing Dry Gauze Electronic Signature(s) Signed: 07/15/2017 4:40:57 PM By: Elpidio Eric BSN, RN Entered By: Elpidio Eric on 07/15/2017 15:22:33 Jessica Donovan ([id number]) -------------------------------------------------------------------------------- Vitals Details Patient Name: Jessica Donovan Date of Service: 07/15/2017 3:00 PM Medical Record Number: [id number] Patient Account Number: [id number] Date of Birth/Sex: 12/17/1946 (71 y.o. Female) Treating RN: Afful, RN, BSN, [employer] Primary Care Hena Ewalt: Garlon Hatchet Other Clinician: Referring Carlise Stofer: Garlon Hatchet Treating Hayli Milligan/Extender: Rudene Re in Treatment: 1 Vital Signs Time Taken: 15:24 Temperature (F): 97.6 Height (in): 66 Pulse (bpm): 75 Weight (lbs): 191 Respiratory Rate (breaths/min): 16 Body Mass Index (BMI): 30.8 Blood Pressure (mmHg): 132/100 Reference Range: 80 - 120 mg / dl Electronic Signature(s) Signed: 07/15/2017 4:40:57 PM By: Elpidio Eric BSN, RN Entered By: Elpidio Eric on 07/15/2017 53:66:44

## 2017-07-20 ENCOUNTER — Encounter
Admission: RE | Disposition: A | Discharge status: Home / Self Care | Payer: Self-pay | Source: Ambulatory Visit | Attending: Gastroenterology

## 2017-07-20 ENCOUNTER — Ambulatory Visit
Admission: RE | Admit: 2017-07-20 | Discharge: 2017-07-20 | Disposition: A | Discharge status: Home / Self Care | Payer: PPO | Source: Ambulatory Visit | Attending: Gastroenterology | Admitting: Gastroenterology

## 2017-07-20 ENCOUNTER — Ambulatory Visit: Payer: PPO | Admitting: Anesthesiology

## 2017-07-20 ENCOUNTER — Encounter: Payer: Self-pay | Admitting: *Deleted

## 2017-07-20 DIAGNOSIS — F419 Anxiety disorder, unspecified: Secondary | ICD-10-CM | POA: Diagnosis not present

## 2017-07-20 DIAGNOSIS — Z8673 Personal history of transient ischemic attack (TIA), and cerebral infarction without residual deficits: Secondary | ICD-10-CM | POA: Insufficient documentation

## 2017-07-20 DIAGNOSIS — I119 Hypertensive heart disease without heart failure: Secondary | ICD-10-CM | POA: Insufficient documentation

## 2017-07-20 DIAGNOSIS — K5730 Diverticulosis of large intestine without perforation or abscess without bleeding: Secondary | ICD-10-CM | POA: Diagnosis not present

## 2017-07-20 DIAGNOSIS — I2510 Atherosclerotic heart disease of native coronary artery without angina pectoris: Secondary | ICD-10-CM | POA: Insufficient documentation

## 2017-07-20 DIAGNOSIS — Z791 Long term (current) use of non-steroidal anti-inflammatories (NSAID): Secondary | ICD-10-CM | POA: Diagnosis not present

## 2017-07-20 DIAGNOSIS — Z7982 Long term (current) use of aspirin: Secondary | ICD-10-CM | POA: Diagnosis not present

## 2017-07-20 DIAGNOSIS — E119 Type 2 diabetes mellitus without complications: Secondary | ICD-10-CM | POA: Diagnosis not present

## 2017-07-20 DIAGNOSIS — Z89422 Acquired absence of other left toe(s): Secondary | ICD-10-CM | POA: Diagnosis not present

## 2017-07-20 DIAGNOSIS — K6389 Other specified diseases of intestine: Secondary | ICD-10-CM | POA: Diagnosis not present

## 2017-07-20 DIAGNOSIS — D649 Anemia, unspecified: Secondary | ICD-10-CM | POA: Diagnosis not present

## 2017-07-20 DIAGNOSIS — Z9660 Presence of unspecified orthopedic joint implant: Secondary | ICD-10-CM | POA: Insufficient documentation

## 2017-07-20 DIAGNOSIS — Z955 Presence of coronary angioplasty implant and graft: Secondary | ICD-10-CM | POA: Insufficient documentation

## 2017-07-20 DIAGNOSIS — I252 Old myocardial infarction: Secondary | ICD-10-CM | POA: Insufficient documentation

## 2017-07-20 DIAGNOSIS — Z794 Long term (current) use of insulin: Secondary | ICD-10-CM | POA: Diagnosis not present

## 2017-07-20 DIAGNOSIS — Z79899 Other long term (current) drug therapy: Secondary | ICD-10-CM | POA: Insufficient documentation

## 2017-07-20 DIAGNOSIS — K5732 Diverticulitis of large intestine without perforation or abscess without bleeding: Secondary | ICD-10-CM | POA: Diagnosis not present

## 2017-07-20 DIAGNOSIS — F418 Other specified anxiety disorders: Secondary | ICD-10-CM | POA: Diagnosis not present

## 2017-07-20 DIAGNOSIS — K5900 Constipation, unspecified: Secondary | ICD-10-CM | POA: Diagnosis not present

## 2017-07-20 HISTORY — PX: COLONOSCOPY WITH PROPOFOL: SHX5780

## 2017-07-20 LAB — GLUCOSE, CAPILLARY: GLUCOSE-CAPILLARY: 96 mg/dL (ref 65–99)

## 2017-07-20 SURGERY — COLONOSCOPY WITH PROPOFOL
Anesthesia: General

## 2017-07-20 MED ORDER — PROPOFOL 10 MG/ML IV BOLUS
INTRAVENOUS | Status: DC | PRN
  Administered 2017-07-20: 40 mg via INTRAVENOUS

## 2017-07-20 MED ORDER — LIDOCAINE HCL (PF) 2 % IJ SOLN
INTRAMUSCULAR | Status: DC | PRN
  Administered 2017-07-20: 50 mg via INTRADERMAL

## 2017-07-20 MED ORDER — PROPOFOL 500 MG/50ML IV EMUL
INTRAVENOUS | Status: DC | PRN
  Administered 2017-07-20: 100 ug/kg/min via INTRAVENOUS

## 2017-07-20 MED ORDER — SODIUM CHLORIDE 0.9 % IV SOLN
INTRAVENOUS | Status: DC
  Administered 2017-07-20: 1000 mL via INTRAVENOUS

## 2017-07-20 MED ORDER — SODIUM CHLORIDE 0.9 % IV SOLN
INTRAVENOUS | Status: DC | PRN
  Administered 2017-07-20: 11:00:00 via INTRAVENOUS

## 2017-07-20 MED ORDER — PROPOFOL 500 MG/50ML IV EMUL
INTRAVENOUS | Status: AC
  Filled 2017-07-20: qty 50

## 2017-07-20 NOTE — Op Note (Signed)
[facility] Gastroenterology Patient Name: Jessica Donovan Procedure Date: 07/20/2017 11:25 AM MRN: [id number] Account #: [id number] Date of Birth: 03-01-1946 Admit Type: Outpatient Age: 71 Room: [facility] ENDO ROOM 1 Gender: Female Note Status: Finalized Procedure:            Colonoscopy Indications:          Follow-up of diverticulitis Providers:            Wyline Mood MD, MD Referring MD:         Lauralyn Primes (Referring MD) Medicines:            Monitored Anesthesia Care Complications:        No immediate complications. Procedure:            Pre-Anesthesia Assessment:                       - Prior to the procedure, a History and Physical was                        performed, and patient medications, allergies and                        sensitivities were reviewed. The patient's tolerance of                        previous anesthesia was reviewed.                       - The risks and benefits of the procedure and the                        sedation options and risks were discussed with the                        patient. All questions were answered and informed                        consent was obtained.                       - ASA Grade Assessment: III - A patient with severe                        systemic disease.                       After obtaining informed consent, the colonoscope was                        passed under direct vision. Throughout the procedure,                        the patient's blood pressure, pulse, and oxygen                        saturations were monitored continuously. The                        Colonoscope was introduced through the anus with the  intention of advancing to the cecum. The scope was                        advanced to the sigmoid colon before the procedure was                        aborted. Medications were given. The colonoscopy was                        extremely difficult due to a  tortuous colon. Successful                        completion of the procedure was aided by changing the                        patient to a supine position, withdrawing the scope and                        replacing with the pediatric colonoscope, straightening                        and shortening the scope to obtain bowel loop reduction                        and using scope torsion. The patient tolerated the                        procedure well. The quality of the bowel preparation                        was good. Findings:      The exam was otherwise normal throughout the examined colon.      The mucosa of the sigmoid colon and rectum appeared normal. It was very       tortious and fixed, was unable to pass through a tight turn .       Diverticulosis of the sigmoid colon was noted.      The exam was otherwise without abnormality. Impression:           - The examination was otherwise normal.                       - No specimens collected. Recommendation:       - Discharge patient to home (with escort).                       - Advance diet as tolerated.                       - 1. Suggest outpatient Ct colonography or barium enema.                       - Return to my office in 4 weeks. Procedure Code(s):    --- Professional ---                       [phone], 53, Colonoscopy, flexible; diagnostic, including                        collection of specimen(s) by brushing or  washing, when                        performed (separate procedure) Diagnosis Code(s):    --- Professional ---                       R60.45K57.32, Diverticulitis of large intestine without                        perforation or abscess without bleeding CPT copyright 2016 [employer]. All rights reserved. The codes documented in this report are preliminary and upon coder review may  be revised to meet current compliance requirements. Wyline MoodKiran Jazleen Robeck, MD Wyline MoodKiran Harrison Zetina MD, MD 07/20/2017 11:53:44 AM This report has been  signed electronically. Number of Addenda: 0 Note Initiated On: 07/20/2017 11:25 AM Scope Withdrawal Time: 0 hours 0 minutes 56 seconds  Total Procedure Duration: 0 hours 20 minutes 37 seconds       [facility]

## 2017-07-20 NOTE — Brief Op Note (Signed)
Unable to reach cecum due to tortuosity. Colonoscopy complete to sigmoid colon

## 2017-07-20 NOTE — Anesthesia Postprocedure Evaluation (Signed)
Anesthesia Post Note  Patient: Jessica DanceBarbara Donovan  Procedure(s) Performed: Procedure(s) (LRB): COLONOSCOPY WITH PROPOFOL (N/A)  Patient location during evaluation: Endoscopy Anesthesia Type: General Level of consciousness: awake and alert and oriented Pain management: pain level controlled Vital Signs Assessment: post-procedure vital signs reviewed and stable Respiratory status: spontaneous breathing, nonlabored ventilation and respiratory function stable Cardiovascular status: blood pressure returned to baseline and stable Postop Assessment: no signs of nausea or vomiting Anesthetic complications: no     Last Vitals:  Vitals:   07/20/17 1216 07/20/17 1226  BP: (!) 135/49 139/65  Pulse: 81 79  Resp: 17 16  Temp:      Last Pain:  Vitals:   07/20/17 1156  TempSrc: Tympanic  PainSc:                  Shelia Magallon

## 2017-07-20 NOTE — Anesthesia Preprocedure Evaluation (Signed)
Anesthesia Evaluation  Patient identified by MRN, date of birth, ID band Patient awake    Reviewed: Allergy & Precautions, NPO status , Patient's Chart, lab work & pertinent test results  History of Anesthesia Complications Negative for: history of anesthetic complications  Airway Mallampati: III  TM Distance: >3 FB Neck ROM: Full    Dental  (+) Partial Upper, Poor Dentition   Pulmonary sleep apnea , neg COPD,    breath sounds clear to auscultation- rhonchi (-) wheezing      Cardiovascular Exercise Tolerance: Good hypertension, Pt. on medications + angina (stable) + CAD, + Past MI and + Cardiac Stents (2014)   Rhythm:Regular Rate:Normal - Systolic murmurs and - Diastolic murmurs Echo 02/24/16: - Left ventricle: Wall thickness was increased in a pattern of   moderate LVH. Systolic function was normal. The estimated   ejection fraction was in the range of 55% to 60%. - Aortic valve: Valve area (Vmax): 2.54 cm^2. - Right ventricle: The cavity size was mildly dilated. Wall   thickness was normal.  L heart cath 11/12/15:  1st Mrg lesion, 30% stenosed.  Prox LAD to Mid LAD lesion, 30% stenosed.  Mid RCA lesion, 30% stenosed.   1. Insignificant CAD with patent coronary stents circumflex and distal RCA 2. Normal left ventricular function   Neuro/Psych PSYCHIATRIC DISORDERS Anxiety Depression CVA (L leg weakness), Residual Symptoms    GI/Hepatic PUD,   Endo/Other  diabetes, Insulin Dependent  Renal/GU Renal InsufficiencyRenal disease     Musculoskeletal  (+) Arthritis ,   Abdominal (+) + obese,   Peds  Hematology  (+) anemia ,   Anesthesia Other Findings Past Medical History: No date: Anxiety No date: Chronic back pain No date: Collagen vascular disease (HCC) No date: Coronary artery disease     Comment:  a. s/p PCI/DES to dRCA & mLCx in 2014 with repeat LHC in              10/2015 showing patent stents No  date: Diabetes mellitus without complication (HCC) No date: Hypertension No date: Low back pain No date: Myocardial infarction (HCC) 01/23/2016: Osteomyelitis of toe (HCC) No date: Stroke [facility](HCC)   Reproductive/Obstetrics                             Anesthesia Physical Anesthesia Plan  ASA: III  Anesthesia Plan: General   Post-op Pain Management:    Induction: Intravenous  PONV Risk Score and Plan: 2 and Propofol infusion  Airway Management Planned: Natural Airway  Additional Equipment:   Intra-op Plan:   Post-operative Plan:   Informed Consent: I have reviewed the patients History and Physical, chart, labs and discussed the procedure including the risks, benefits and alternatives for the proposed anesthesia with the patient or authorized representative who has indicated his/her understanding and acceptance.   Dental advisory given  Plan Discussed with: CRNA and Anesthesiologist  Anesthesia Plan Comments:         Anesthesia Quick Evaluation

## 2017-07-20 NOTE — Anesthesia Post-op Follow-up Note (Signed)
Anesthesia QCDR form completed.        

## 2017-07-20 NOTE — Transfer of Care (Signed)
Immediate Anesthesia Transfer of Care Note  Patient: Jessica DanceBarbara Stelly  Procedure(s) Performed: Procedure(s): COLONOSCOPY WITH PROPOFOL (N/A)  Patient Location: Endoscopy Unit  Anesthesia Type:General  Level of Consciousness: awake and alert   Airway & Oxygen Therapy: Patient Spontanous Breathing and Patient connected to nasal cannula oxygen  Post-op Assessment: Report given to RN and Post -op Vital signs reviewed and stable  Post vital signs: Reviewed and stable  Last Vitals:  Vitals:   07/20/17 1056  BP: 118/72  Pulse: 87  Resp: 20  Temp: 37.1 C    Last Pain:  Vitals:   07/20/17 1056  TempSrc: Tympanic  PainSc: 7          Complications: No apparent anesthesia complications

## 2017-07-20 NOTE — H&P (Signed)
Wyline Mood MD [address] [place], [place] [address] Phone: [phone] Fax : [phone]  Primary Care Physician:  Garlon Hatchet, MD Primary Gastroenterologist:  Dr. Wyline Mood   Pre-Procedure History & Physical: HPI:  Jessica Donovan is a 71 y.o. female is here for an colonoscopy.   Past Medical History:  Diagnosis Date  . Anxiety   . Chronic back pain   . Collagen vascular disease (HCC)   . Coronary artery disease    a. s/p PCI/DES to dRCA & mLCx in 2014 with repeat LHC in 10/2015 showing patent stents  . Diabetes mellitus without complication (HCC)   . Hypertension   . Low back pain   . Myocardial infarction (HCC)   . Osteomyelitis of toe (HCC) 01/23/2016  . Stroke [facility])     Past Surgical History:  Procedure Laterality Date  . AMPUTATION TOE Left 11/10/2015   Procedure: AMPUTATION TOE;  Surgeon: Linus Galas, MD;  Location: [facility] ORS;  Service: Podiatry;  Laterality: Left;  . AMPUTATION TOE Left 01/24/2016   Procedure: AMPUTATION TOE (2nd mpj);  Surgeon: Linus Galas, DPM;  Location: [facility] ORS;  Service: Podiatry;  Laterality: Left;  . CARDIAC CATHETERIZATION N/A 11/12/2015   Procedure: Left Heart Cath and Coronary Angiography;  Surgeon: Marcina Millard, MD;  Location: [facility] INVASIVE CV LAB;  Service: Cardiovascular;  Laterality: N/A;  . JOINT REPLACEMENT    . TOE AMPUTATION      Prior to Admission medications   Medication Sig Start Date End Date Taking? Authorizing Provider  insulin aspart (NOVOLOG) 100 UNIT/ML injection Inject 3-15 Units into the skin 3 (three) times daily with meals as needed for high blood sugar. Pt uses as needed per sliding scale:    Less than 140:  0 units  140-180:  3 units 181-220:  4 units 221- 260:  6 units 261- 320:  8 units 321-360:  10 units 361-400:  12 units Greater than 400:  15 units   Yes [provider]  acetaminophen (TYLENOL) 500 MG tablet Take 1,000 mg by mouth every 4 (four) hours as needed for mild  pain or headache.     [provider]  ALPRAZolam Prudy Feeler) 1 MG tablet Take 1 mg by mouth 3 (three) times daily.  02/08/17   [provider]  aspirin EC 81 MG tablet Take 81 mg by mouth daily.    [provider]  atorvastatin (LIPITOR) 20 MG tablet Take 20 mg by mouth at bedtime.     [provider]  Biotin 5 MG CAPS Take 5 mg by mouth daily.     [provider]  celecoxib (CELEBREX) 200 MG capsule Take 200 mg by mouth daily.     [provider]  cyanocobalamin 1000 MCG tablet cyanocobalamin (vit B-12) 1,000 mcg tablet  take 1 tablet by oral route  every day    [provider]  cyclobenzaprine (FLEXERIL) 10 MG tablet Take 1 tablet (10 mg total) by mouth 3 (three) times daily as needed for muscle spasms. 01/27/16   Enid Baas, MD  diphenoxylate-atropine (LOMOTIL) 2.5-0.025 MG tablet Take 1 tablet by mouth every 4 (four) hours as needed for diarrhea or loose stools.     [provider]  ferrous sulfate 325 (65 FE) MG tablet Take 325 mg by mouth daily with breakfast.    [provider]  ferrous sulfate 325 (65 FE) MG tablet ferrous sulfate 325 mg (65 mg iron) tablet  TAKE ONE (1) TABLET BY MOUTH EVERY  DAY    [provider]  fesoterodine (TOVIAZ) 8 MG TB24 tablet Take 8 mg by mouth at bedtime.     [provider]  furosemide (LASIX) 20 MG tablet Take 1 tablet (20 mg total) by mouth daily as needed. Patient taking differently: Take 20 mg by mouth daily as needed for fluid.  03/01/17   Enedina FinnerPatel, Sona, MD  Insulin Glargine (TOUJEO SOLOSTAR) 300 UNIT/ML SOPN Inject 30 Units into the skin daily. 03/01/17   Enedina FinnerPatel, Sona, MD  isosorbide mononitrate (IMDUR) 30 MG 24 hr tablet Take 30 mg by mouth daily.     [provider]  liothyronine (CYTOMEL) 25 MCG tablet Take 25 mcg by mouth daily.    [provider]  lisinopril (PRINIVIL,ZESTRIL) 20 MG tablet Take 20 mg by mouth daily.    [provider]  metoprolol tartrate (LOPRESSOR) 25 MG tablet Take 25 mg by mouth 2 (two) times daily. 01/28/17   [provider]  mirtazapine (REMERON) 15 MG tablet Take 15 mg by mouth at bedtime.    [provider]  morphine (MS CONTIN) 15 MG 12 hr tablet Take 1 tablet (15 mg total) by mouth every 12 (twelve) hours. 05/23/17   Auburn BilberryPatel, Shreyang, MD  morphine (MSIR) 15 MG tablet Take 1 tablet (15 mg total) by mouth 3 (three) times daily as needed for severe pain. 05/23/17   Auburn BilberryPatel, Shreyang, MD  Multiple Vitamin (MULTIVITAMIN WITH MINERALS) TABS tablet Take 1 tablet by mouth daily.    [provider]  mupirocin ointment (BACTROBAN) 2 % mupirocin 2 % topical ointment    [provider]  nitrofurantoin (MACRODANTIN) 100 MG capsule Take 100 mg by mouth daily.    [provider]  nitrofurantoin, macrocrystal-monohydrate, (MACROBID) 100 MG capsule Take by mouth. 03/17/16   [provider]  nitroGLYCERIN (NITROSTAT) 0.4 MG SL tablet Place 0.4 mg under the tongue every 5 (five) minutes as needed for chest pain. Reported on 04/16/2016    [provider]  omeprazole (PRILOSEC) 20 MG capsule Take 20 mg by mouth daily.    [provider]  Omeprazole 20 MG TBEC omeprazole 20 mg capsule,delayed release    [provider]  Polyethylene Glycol 3350 (MIRALAX PO) Miralax 17 gram oral powder packet  take 1 packet by oral route  every day mixed with 8 oz. water, juice, soda, coffee or tea    [provider]  polyethylene glycol powder (GLYCOLAX/MIRALAX) powder 17 grams as needed each time for constpation 06/24/17   Wyline MoodAnna, Edward Guthmiller, MD  pregabalin (LYRICA) 150 MG capsule Take 1 capsule (150 mg total) by mouth 2 (two) times daily. Patient taking differently: Take 150 mg by mouth 3 (three) times daily.  01/19/17   Milagros LollSudini, Srikar, MD  promethazine (PHENERGAN) 25 MG tablet Take 25 mg by mouth every 6 (six) hours as needed for nausea.     [provider]  solifenacin (VESICARE) 10 MG tablet Vesicare 10 mg tablet  take 1 tablet by mouth  every day 03/29/15   [provider]  ticagrelor (BRILINTA) 90 MG TABS tablet Brilinta 90 mg tablet    [provider]  tolterodine (DETROL LA) 4 MG 24 hr capsule Take by mouth. 03/17/16   [provider]  traZODone (DESYREL) 100 MG tablet Take 100 mg by mouth at bedtime. 01/28/17   [provider]  venlafaxine XR (EFFEXOR-XR) 75 MG 24 hr capsule Take 225 mg by mouth daily.    [provider]  vitamin B-12 (CYANOCOBALAMIN) 1000 MCG tablet Take 1,000 mcg by mouth daily.    [provider]  Vitamin D, Ergocalciferol, (DRISDOL) 50000 units CAPS capsule Take 50,000 Units by mouth every Sunday.     [provider]    Allergies as of 06/25/2017 - Review Complete 06/24/2017  Allergen Reaction Noted  . Codeine Nausea And Vomiting 11/07/2015  . Flu virus vaccine  03/22/2013  . Influenza vaccines Other (See Comments) 11/07/2015  . Methadone Hives and Itching 11/07/2015  . Oxycodone-acetaminophen Nausea And Vomiting 11/07/2015  . Percocet [oxycodone-acetaminophen] Nausea And Vomiting 11/07/2015  . Tetanus antitoxin  03/16/2012  . Tetanus toxoids Swelling and Other (See Comments) 03/22/2013  . Penicillin g Rash 11/06/2015  . Tetracyclines & related Rash 11/07/2015    Family History  Problem Relation Age of Onset  . CAD Mother   . CAD Father     Social History   Social History  . Marital status: Widowed    Spouse name: N/A  . Number of children: N/A  . Years of education: N/A   Occupational History  . Not on file.   Social History Main Topics  . Smoking status: Never Smoker  . Smokeless tobacco: Never Used  . Alcohol use No  . Drug use: No  . Sexual activity: Not Currently   Other Topics Concern  . Not on file   Social History Narrative  . No narrative on file    Review of Systems: See HPI, otherwise negative  ROS  Physical Exam: BP 118/72   Pulse 87   Temp 98.7 F (37.1 C) (Tympanic)   Resp 20   Ht 5' 7.5" (1.715 m)   Wt 194 lb (88 kg)   SpO2 93%   BMI 29.94 kg/m  General:   Alert,  pleasant and cooperative in NAD Head:  Normocephalic and atraumatic. Neck:  Supple; no masses or thyromegaly. Lungs:  Clear throughout to auscultation.    Heart:  Regular rate and rhythm. Abdomen:  Soft, nontender and nondistended. Normal bowel sounds, without guarding, and without rebound.   Neurologic:  Alert and  oriented x4;  grossly normal neurologically.  Impression/Plan: Mellody DanceBarbara Fessel is here for an colonoscopy to be performed for diverticulitis   Risks, benefits, limitations, and alternatives regarding  colonoscopy have been reviewed with the patient.  Questions have been answered.  All parties agreeable.   Wyline MoodKiran Duru Reiger, MD  07/20/2017, 11:14 AM

## 2017-07-21 ENCOUNTER — Encounter: Payer: Self-pay | Admitting: Gastroenterology

## 2017-07-22 ENCOUNTER — Encounter: Payer: PPO | Attending: Surgery | Admitting: Surgery

## 2017-07-22 DIAGNOSIS — Z888 Allergy status to other drugs, medicaments and biological substances status: Secondary | ICD-10-CM | POA: Insufficient documentation

## 2017-07-22 DIAGNOSIS — F419 Anxiety disorder, unspecified: Secondary | ICD-10-CM | POA: Diagnosis not present

## 2017-07-22 DIAGNOSIS — Z89422 Acquired absence of other left toe(s): Secondary | ICD-10-CM | POA: Diagnosis not present

## 2017-07-22 DIAGNOSIS — E1140 Type 2 diabetes mellitus with diabetic neuropathy, unspecified: Secondary | ICD-10-CM | POA: Insufficient documentation

## 2017-07-22 DIAGNOSIS — M549 Dorsalgia, unspecified: Secondary | ICD-10-CM | POA: Diagnosis not present

## 2017-07-22 DIAGNOSIS — Z7982 Long term (current) use of aspirin: Secondary | ICD-10-CM | POA: Insufficient documentation

## 2017-07-22 DIAGNOSIS — M1990 Unspecified osteoarthritis, unspecified site: Secondary | ICD-10-CM | POA: Diagnosis not present

## 2017-07-22 DIAGNOSIS — L97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 layer exposed: Secondary | ICD-10-CM | POA: Insufficient documentation

## 2017-07-22 DIAGNOSIS — Z885 Allergy status to narcotic agent status: Secondary | ICD-10-CM | POA: Insufficient documentation

## 2017-07-22 DIAGNOSIS — Z794 Long term (current) use of insulin: Secondary | ICD-10-CM | POA: Diagnosis not present

## 2017-07-22 DIAGNOSIS — E11621 Type 2 diabetes mellitus with foot ulcer: Secondary | ICD-10-CM | POA: Diagnosis not present

## 2017-07-22 DIAGNOSIS — L97319 Non-pressure chronic ulcer of right ankle with unspecified severity: Secondary | ICD-10-CM | POA: Diagnosis not present

## 2017-07-22 DIAGNOSIS — L97519 Non-pressure chronic ulcer of other part of right foot with unspecified severity: Secondary | ICD-10-CM | POA: Diagnosis not present

## 2017-07-22 DIAGNOSIS — G8929 Other chronic pain: Secondary | ICD-10-CM | POA: Insufficient documentation

## 2017-07-22 DIAGNOSIS — Z881 Allergy status to other antibiotic agents status: Secondary | ICD-10-CM | POA: Insufficient documentation

## 2017-07-22 DIAGNOSIS — Z79899 Other long term (current) drug therapy: Secondary | ICD-10-CM | POA: Insufficient documentation

## 2017-07-22 DIAGNOSIS — G4730 Sleep apnea, unspecified: Secondary | ICD-10-CM | POA: Diagnosis not present

## 2017-07-22 DIAGNOSIS — I10 Essential (primary) hypertension: Secondary | ICD-10-CM | POA: Diagnosis not present

## 2017-07-22 DIAGNOSIS — I2510 Atherosclerotic heart disease of native coronary artery without angina pectoris: Secondary | ICD-10-CM | POA: Diagnosis not present

## 2017-07-22 DIAGNOSIS — E11622 Type 2 diabetes mellitus with other skin ulcer: Secondary | ICD-10-CM | POA: Diagnosis not present

## 2017-07-22 DIAGNOSIS — L97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 left ankle with fat layer exposed: Secondary | ICD-10-CM | POA: Insufficient documentation

## 2017-07-23 ENCOUNTER — Telehealth: Payer: Self-pay | Admitting: Gastroenterology

## 2017-07-23 ENCOUNTER — Encounter: Payer: Self-pay | Admitting: Gastroenterology

## 2017-07-23 NOTE — Telephone Encounter (Signed)
Attempted to call patient to schedule a follow up appt with Dr. Tobi BastosAnna. Her phone will not accept incoming call. Letter sent

## 2017-07-24 NOTE — Progress Notes (Signed)
TYA, HAUGHEY ([id number]) Visit Report for 07/22/2017 Arrival Information Details Patient Name: Jessica Donovan, Jessica Donovan Date of Service: 07/22/2017 3:45 PM Medical Record Number: [id number] Patient Account Number: [id number] Date of Birth/Sex: 10-26-1946 (71 y.o. Female) Treating RN: Ashok Cordia, Debi Primary Care Michaelle Bottomley: Garlon Hatchet Other Clinician: Referring Seren Chaloux: Garlon Hatchet Treating Tenessa Marsee/Extender: Rudene Re in Treatment: 2 Visit Information History Since Last Visit All ordered tests and consults were completed: No Patient Arrived: Dan Humphreys Added or deleted any medications: No Arrival Time: 15:30 Any new allergies or adverse reactions: No Accompanied By: self Had a fall or experienced change in No Transfer Assistance: None activities of daily living that may affect Patient Identification Verified: Yes risk of falls: Secondary Verification Process Yes Signs or symptoms of abuse/neglect since last No Completed: visito Patient Requires Transmission- No Hospitalized since last visit: No Based Precautions: Has Dressing in Place as Prescribed: Yes Patient Has Alerts: Yes Pain Present Now: Yes Patient Alerts: Patient on Blood Thinner ASA Electronic Signature(s) Signed: 07/22/2017 4:42:53 PM By: Alejandro Mulling Entered By: Alejandro Mulling on 07/22/2017 15:31:56 Clingenpeel, [name] ([id number]) -------------------------------------------------------------------------------- Clinic Level of Care Assessment Details Patient Name: [name] Date of Service: 07/22/2017 3:45 PM Medical Record Number: [id number] Patient Account Number: [id number] Date of Birth/Sex: Jun 27, 1946 (71 y.o. Female) Treating RN: Ashok Cordia, Debi Primary Care Lesa Vandall: Garlon Hatchet Other Clinician: Referring Madisan Bice: Garlon Hatchet Treating Rushil Kimbrell/Extender: Rudene Re in Treatment: 2 Clinic Level of Care Assessment Items TOOL 4 Quantity Score X - Use when only an  EandM is performed on FOLLOW-UP visit 1 0 ASSESSMENTS - Nursing Assessment / Reassessment X - Reassessment of Co-morbidities (includes updates in patient status) 1 10 X - Reassessment of Adherence to Treatment Plan 1 5 ASSESSMENTS - Wound and Skin Assessment / Reassessment []  - Simple Wound Assessment / Reassessment - one wound 0 X - Complex Wound Assessment / Reassessment - multiple wounds 3 5 []  - Dermatologic / Skin Assessment (not related to wound area) 0 ASSESSMENTS - Focused Assessment []  - Circumferential Edema Measurements - multi extremities 0 []  - Nutritional Assessment / Counseling / Intervention 0 []  - Lower Extremity Assessment (monofilament, tuning fork, pulses) 0 []  - Peripheral Arterial Disease Assessment (using hand held doppler) 0 ASSESSMENTS - Ostomy and/or Continence Assessment and Care []  - Incontinence Assessment and Management 0 []  - Ostomy Care Assessment and Management (repouching, etc.) 0 PROCESS - Coordination of Care X - Simple Patient / Family Education for ongoing care 1 15 []  - Complex (extensive) Patient / Family Education for ongoing care 0 []  - Staff obtains Chiropractor, Records, Test Results / Process Orders 0 []  - Staff telephones HHA, Nursing Homes / Clarify orders / etc 0 []  - Routine Transfer to another Facility (non-emergent condition) 0 Guizar, Jettie ([id number]) []  - Routine Hospital Admission (non-emergent condition) 0 []  - New Admissions / Manufacturing engineer / Ordering NPWT, Apligraf, etc. 0 []  - Emergency Hospital Admission (emergent condition) 0 X - Simple Discharge Coordination 1 10 []  - Complex (extensive) Discharge Coordination 0 PROCESS - Special Needs []  - Pediatric / Minor Patient Management 0 []  - Isolation Patient Management 0 []  - Hearing / Language / Visual special needs 0 []  - Assessment of Community assistance (transportation, D/C planning, etc.) 0 []  - Additional assistance / Altered mentation 0 []  - Support Surface(s)  Assessment (bed, cushion, seat, etc.) 0 INTERVENTIONS - Wound Cleansing / Measurement []  - Simple Wound Cleansing - one wound 0 X - Complex Wound Cleansing - multiple wounds 3 5 X - Wound  Imaging (photographs - any number of wounds) 1 5 []  - Wound Tracing (instead of photographs) 0 []  - Simple Wound Measurement - one wound 0 X - Complex Wound Measurement - multiple wounds 2 5 INTERVENTIONS - Wound Dressings X - Small Wound Dressing one or multiple wounds 2 10 []  - Medium Wound Dressing one or multiple wounds 0 []  - Large Wound Dressing one or multiple wounds 0 X - Application of Medications - topical 1 5 []  - Application of Medications - injection 0 INTERVENTIONS - Miscellaneous []  - External ear exam 0 Mcginnity, Charlean ([id number]) []  - Specimen Collection (cultures, biopsies, blood, body fluids, etc.) 0 []  - Specimen(s) / Culture(s) sent or taken to Lab for analysis 0 []  - Patient Transfer (multiple staff / Michiel SitesHoyer Lift / Similar devices) 0 []  - Simple Staple / Suture removal (25 or less) 0 []  - Complex Staple / Suture removal (26 or more) 0 []  - Hypo / Hyperglycemic Management (close monitor of Blood Glucose) 0 []  - Ankle / Brachial Index (ABI) - do not check if billed separately 0 X - Vital Signs 1 5 Has the patient been seen at the hospital within the last three years: Yes Total Score: 115 Level Of Care: New/Established - Level 3 Electronic Signature(s) Signed: 07/22/2017 4:42:53 PM By: Alejandro MullingPinkerton, Debra Entered By: Alejandro MullingPinkerton, Debra on 07/22/2017 16:34:06 Scherman, Britta MccreedyBARBARA ([id number]) -------------------------------------------------------------------------------- Encounter Discharge Information Details Patient Name: Mellody DanceGLASS, Dunia Date of Service: 07/22/2017 3:45 PM Medical Record Number: [id number] Patient Account Number: [id number] Date of Birth/Sex: 09/16/1946 [age] y.o. Female) Treating RN: Ashok CordiaPinkerton, Debi Primary Care Rashauna Tep: Garlon HatchetMCCONVILLE, ROBERT Other Clinician: Referring  Dejane Scheibe: Garlon HatchetMCCONVILLE, ROBERT Treating Vikram Tillett/Extender: Rudene ReBritto, Errol Weeks in Treatment: 2 Encounter Discharge Information Items Discharge Pain Level: 0 Discharge Condition: Stable Ambulatory Status: Walker Discharge Destination: Home Transportation: Private Auto Accompanied By: self Schedule Follow-up Appointment: Yes Medication Reconciliation completed No and provided to Patient/Care Haris Baack: Provided on Clinical Summary of Care: 07/22/2017 Form Type Recipient Paper Patient BG Electronic Signature(s) Signed: 07/22/2017 4:42:53 PM By: Alejandro MullingPinkerton, Debra Previous Signature: 07/22/2017 4:04:10 PM Version By: Gwenlyn PerkingMoore, Shelia Entered By: Alejandro MullingPinkerton, Debra on 07/22/2017 16:13:42 Copus, Britta MccreedyBARBARA ([id number]) -------------------------------------------------------------------------------- Lower Extremity Assessment Details Patient Name: Mellody DanceGLASS, Kristiann Date of Service: 07/22/2017 3:45 PM Medical Record Number: [id number] Patient Account Number: [id number] Date of Birth/Sex: 09/16/1946 (71 y.o. Female) Treating RN: Ashok CordiaPinkerton, Debi Primary Care Lavi Sheehan: Garlon HatchetMCCONVILLE, ROBERT Other Clinician: Referring Madelon Welsch: Garlon HatchetMCCONVILLE, ROBERT Treating Deneise Getty/Extender: Rudene ReBritto, Errol Weeks in Treatment: 2 Vascular Assessment Pulses: Dorsalis Pedis Palpable: [Left:Yes] [Right:Yes] Posterior Tibial Extremity colors, hair growth, and conditions: Extremity Color: [Left:Normal] [Right:Normal] Temperature of Extremity: [Left:Warm] [Right:Warm] Capillary Refill: [Left:< 3 seconds] [Right:< 3 seconds] Electronic Signature(s) Signed: 07/22/2017 4:42:53 PM By: Alejandro MullingPinkerton, Debra Entered By: Alejandro MullingPinkerton, Debra on 07/22/2017 15:48:19 Bennette, Britta MccreedyBARBARA ([id number]) -------------------------------------------------------------------------------- Multi Wound Chart Details Patient Name: Mellody DanceGLASS, Annelyse Date of Service: 07/22/2017 3:45 PM Medical Record Number: [id number] Patient Account Number: [id number] Date of Birth/Sex:  09/16/1946 (71 y.o. 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 the Lower Extremity the Lower Extremity Comorbid History: Sleep Apnea, Coronary Sleep Apnea, Coronary Sleep Apnea, Coronary Artery Disease,  Artery Disease, Artery Disease, Hypertension, Myocardial Hypertension, Myocardial Hypertension, Myocardial Infarction, Type II Infarction, Type II Infarction, Type II Diabetes, History of Diabetes, History of Diabetes, History of pressure wounds, pressure wounds, pressure wounds, Osteoarthritis, Osteoarthritis, Osteoarthritis, Neuropathy, Confinement Neuropathy, Confinement Neuropathy, Confinement Anxiety Anxiety Anxiety Date Acquired: 02/17/2017 02/17/2017 02/17/2017 Weeks of Treatment: 2 2 2  Wound Status: Open Open Healed - Epithelialized Measurements L x W x D 0.1x0.1x0.1 0.9x0.7x0.1 0x0x0 (cm) Area (cm) : 0.008 0.495 0 Volume (cm) : 0.001 0.049 0 % Reduction in Area: 97.70% 59.60% 100.00% % Reduction in Volume: 97.10% 60.20% 100.00% Classification: Grade 1 Grade 1 Grade 1 Kirshenbaum, Zaida ([id number]) Exudate Amount: Small Large None Present Exudate Type: Serous Serous N/A Exudate Color: amber amber N/A Wound Margin: Flat and Intact Flat and Intact Flat and Intact Granulation Amount: Large (67-100%) None Present (0%) None Present (0%) Granulation Quality: Pink N/A N/A Necrotic Amount:  Small (1-33%) Large (67-100%) None Present (0%) Epithelialization: None None Large (67-100%) Periwound Skin Texture: No Abnormalities Noted No Abnormalities Noted No Abnormalities Noted Periwound Skin No Abnormalities Noted No Abnormalities Noted No Abnormalities Noted Moisture: Periwound Skin Color: No Abnormalities Noted No Abnormalities Noted No Abnormalities Noted Temperature: No Abnormality No Abnormality No Abnormality Tenderness on Yes Yes Yes Palpation: Wound Preparation: Ulcer Cleansing: Ulcer Cleansing: Ulcer Cleansing: Rinsed/Irrigated with Rinsed/Irrigated with Rinsed/Irrigated with Saline Saline Saline Topical Anesthetic Topical Anesthetic Topical Anesthetic Applied: Other: lidocaine Applied: Other: lidocaine Applied: None 4% 4% Treatment Notes Wound #1 (Left Malleolus) 1. Cleansed with: Clean wound with Normal Saline 2. Anesthetic Topical Lidocaine 4% cream to wound bed prior to debridement 4. Dressing Applied: Prisma Ag 5. Secondary Dressing Applied Bordered Foam Dressing Dry Gauze Wound #2 (Right Malleolus) 1. Cleansed with: Clean wound with Normal Saline 2. Anesthetic Topical Lidocaine 4% cream to wound bed prior to debridement 4. Dressing Applied: Santyl Ointment 5. Secondary Dressing Applied Bordered Foam Dressing Dry Gauze DAVON, FOLTA ([id number]) Electronic Signature(s) Signed: 07/22/2017 4:21:02 PM By: Evlyn Kanner MD, FACS Entered By: Evlyn Kanner on 07/22/2017 16:21:01 [name] ([id number]) -------------------------------------------------------------------------------- Multi-Disciplinary Care Plan Details Patient Name: [name] Date of Service: 07/22/2017 3:45 PM Medical Record Number: [id number] Patient Account Number: [id number] Date of Birth/Sex: [birth year] (71 y.o. Female) Treating RN: Ashok Cordia, Debi Primary Care Liela Rylee: Garlon Hatchet Other Clinician: Referring Litzy Dicker: Garlon Hatchet Treating Amarachukwu Lakatos/Extender:  Rudene Re in Treatment: 2 Active Inactive ` Orientation to the Wound Care Program Nursing Diagnoses: Knowledge deficit related to the wound healing center program Goals: Patient/caregiver will verbalize understanding of the Wound Healing Center Program Date Initiated: 07/08/2017 Target Resolution Date: 10/08/2017 Goal Status: Active Interventions: Provide education on orientation to the wound center Notes: ` Peripheral Neuropathy Nursing Diagnoses: Knowledge deficit related to disease process and management of peripheral neurovascular dysfunction Potential alteration in peripheral tissue perfusion (select prior to confirmation of diagnosis) Goals: Patient/caregiver will verbalize understanding of disease process and disease management Date Initiated: 07/08/2017 Target Resolution Date: 10/08/2017 Goal Status: Active Interventions: Assess signs and symptoms of neuropathy upon admission and as needed Provide education on Management of Neuropathy and Related Ulcers Provide education on Management of Neuropathy upon discharge from the Wound Center Treatment Activities: Patient referred for customized footwear/offloading : 07/08/2017 Patient referred to diabetes educator : 07/08/2017 [name] ([id number]) Notes: ` Wound/Skin Impairment Nursing Diagnoses: Impaired tissue integrity Knowledge deficit related to ulceration/compromised skin integrity Goals: Patient/caregiver will verbalize understanding of skin care regimen Date Initiated: 07/08/2017 Target Resolution Date: 10/08/2017 Goal Status: Active Ulcer/skin breakdown will have a volume reduction  of 30% by week 4 Date Initiated: 07/08/2017 Target Resolution Date: 10/08/2017 Goal Status: Active Ulcer/skin breakdown will have a volume reduction of 50% by week 8 Date Initiated: 07/08/2017 Target Resolution Date: 10/08/2017 Goal Status: Active Ulcer/skin breakdown will have a volume reduction of 80% by week  12 Date Initiated: 07/08/2017 Target Resolution Date: 10/08/2017 Goal Status: Active Ulcer/skin breakdown will heal within 14 weeks Date Initiated: 07/08/2017 Target Resolution Date: 10/08/2017 Goal Status: Active Interventions: Assess patient/caregiver ability to obtain necessary supplies Assess patient/caregiver ability to perform ulcer/skin care regimen upon admission and as needed Assess ulceration(s) every visit Treatment Activities: Referred to DME Florine Sprenkle for dressing supplies : 07/08/2017 Skin care regimen initiated : 07/08/2017 Topical wound management initiated : 07/08/2017 Notes: Electronic Signature(s) Signed: 07/22/2017 4:42:53 PM By: Alejandro Mulling Entered By: Alejandro Mulling on 07/22/2017 15:50:22 Fruth, [name] ([id number]) -------------------------------------------------------------------------------- Pain Assessment Details Patient Name: [name] Date of Service: 07/22/2017 3:45 PM Medical Record Number: [id number] Patient Account Number: [id number] Date of Birth/Sex: 03/06/1946 (71 y.o. Female) Treating RN: Ashok Cordia, Debi Primary Care Tinika Bucknam: Garlon Hatchet Other Clinician: Referring Raun Routh: Garlon Hatchet Treating Kristeena Meineke/Extender: Rudene Re in Treatment: 2 Active Problems Location of Pain Severity and Description of Pain Patient Has Paino Yes Site Locations Pain Location: Pain in Ulcers Rate the pain. Current Pain Level: 5 Character of Pain Describe the Pain: Aching, Tender Pain Management and Medication Current Pain Management: Electronic Signature(s) Signed: 07/22/2017 4:42:53 PM By: Alejandro Mulling Entered By: Alejandro Mulling on 07/22/2017 15:32:19 [name] ([id number]) -------------------------------------------------------------------------------- Patient/Caregiver Education Details Patient Name: [name] Date of Service: 07/22/2017 3:45 PM Medical Record Number: [id number] Patient Account Number:  [id number] Date of Birth/Gender: Oct 08, 1946 (71 y.o. Female) Treating RN: Phillis Haggis Primary Care Physician: Garlon Hatchet Other Clinician: Referring Physician: Garlon Hatchet Treating Physician/Extender: Rudene Re in Treatment: 2 Education Assessment Education Provided To: Patient Education Topics Provided Wound/Skin Impairment: Handouts: Other: change dressing as ordered Methods: Demonstration, Explain/Verbal Responses: State content correctly Electronic Signature(s) Signed: 07/22/2017 4:42:53 PM By: Alejandro Mulling Entered By: Alejandro Mulling on 07/22/2017 16:14:26 Vangorder, [name] ([id number]) -------------------------------------------------------------------------------- Wound Assessment Details Patient Name: [name] Date of Service: 07/22/2017 3:45 PM Medical Record Number: [id number] Patient Account Number: [id number] Date of Birth/Sex: Jul 21, 1946 (71 y.o. Female) Treating RN: Ashok Cordia, Debi Primary Care Kynleigh Artz: Garlon Hatchet Other Clinician: Referring Shevy Yaney: Garlon Hatchet Treating Perrin Gens/Extender: Rudene Re in Treatment: 2 Wound Status Wound Number: 1 Primary Diabetic Wound/Ulcer of the Lower Etiology: Extremity Wound Location: Left Malleolus Wound Open Wounding Event: Gradually Appeared Status: Date Acquired: 02/17/2017 Comorbid Sleep Apnea, Coronary Artery Disease, Weeks Of Treatment: 2 History: Hypertension, Myocardial Infarction, Clustered Wound: No Type II Diabetes, History of pressure wounds, Osteoarthritis, Neuropathy, Confinement Anxiety Photos Photo Uploaded By: Alejandro Mulling on 07/22/2017 16:11:21 Wound Measurements Length: (cm) 0.1 Width: (cm) 0.1 Depth: (cm) 0.1 Area: (cm) 0.008 Volume: (cm) 0.001 % Reduction in Area: 97.7% % Reduction in Volume: 97.1% Epithelialization: None Tunneling: No Undermining: No Wound Description Classification: Grade 1 Wound Margin: Flat and  Intact Exudate Amount: Small Exudate Type: Serous Exudate Color: amber Foul Odor After Cleansing: No Slough/Fibrino Yes Wound Bed Granulation Amount: Large (67-100%) Granulation Quality: Pink Spivey, Janese ([id number]) Necrotic Amount: Small (1-33%) Necrotic Quality: Adherent Slough Periwound Skin Texture Texture Color No Abnormalities Noted: No No Abnormalities Noted: No Moisture Temperature / Pain No Abnormalities Noted: No Temperature: No Abnormality Tenderness on Palpation: Yes Wound Preparation Ulcer Cleansing: Rinsed/Irrigated with Saline Topical Anesthetic Applied: Other: lidocaine 4%, Treatment Notes Wound #1 (Left Malleolus) 1. Cleansed  with: Clean wound with Normal Saline 2. Anesthetic Topical Lidocaine 4% cream to wound bed prior to debridement 4. Dressing Applied: Prisma Ag 5. Secondary Dressing Applied Bordered Foam Dressing Dry Gauze Electronic Signature(s) Signed: 07/22/2017 4:42:53 PM By: Alejandro MullingPinkerton, Debra Entered By: Alejandro MullingPinkerton, Debra on 07/22/2017 15:45:11 Deguzman, Britta MccreedyBARBARA ([id number]) -------------------------------------------------------------------------------- Wound Assessment Details Patient Name: Mellody DanceGLASS, Merve Date of Service: 07/22/2017 3:45 PM Medical Record Number: [id number] Patient Account Number: [id number] Date of Birth/Sex: June 24, 1946 (71 y.o. Female) Treating RN: Ashok CordiaPinkerton, Debi Primary Care Tetsuo Coppola: Garlon HatchetMCCONVILLE, ROBERT Other Clinician: Referring Audley Hinojos: Garlon HatchetMCCONVILLE, ROBERT Treating Josiane Labine/Extender: Rudene ReBritto, Errol Weeks in Treatment: 2 Wound Status Wound Number: 2 Primary Diabetic Wound/Ulcer of the Lower Etiology: Extremity Wound Location: Right Malleolus Wound Open Wounding Event: Gradually Appeared Status: Date Acquired: 02/17/2017 Comorbid Sleep Apnea, Coronary Artery Disease, Weeks Of Treatment: 2 History: Hypertension, Myocardial Infarction, Clustered Wound: No Type II Diabetes, History of pressure wounds, Osteoarthritis,  Neuropathy, Confinement Anxiety Photos Photo Uploaded By: Alejandro MullingPinkerton, Debra on 07/22/2017 16:11:39 Wound Measurements Length: (cm) 0.9 Width: (cm) 0.7 Depth: (cm) 0.1 Area: (cm) 0.495 Volume: (cm) 0.049 % Reduction in Area: 59.6% % Reduction in Volume: 60.2% Epithelialization: None Tunneling: No Undermining: No Wound Description Classification: Grade 1 Wound Margin: Flat and Intact Exudate Amount: Large Exudate Type: Serous Exudate Color: amber Foul Odor After Cleansing: No Slough/Fibrino Yes Wound Bed Granulation Amount: None Present (0%) Necrotic Amount: Large (67-100%) Koble, Jeane ([id number]) Necrotic Quality: Adherent Slough Periwound Skin Texture Texture Color No Abnormalities Noted: No No Abnormalities Noted: No Moisture Temperature / Pain No Abnormalities Noted: No Temperature: No Abnormality Tenderness on Palpation: Yes Wound Preparation Ulcer Cleansing: Rinsed/Irrigated with Saline Topical Anesthetic Applied: Other: lidocaine 4%, Treatment Notes Wound #2 (Right Malleolus) 1. Cleansed with: Clean wound with Normal Saline 2. Anesthetic Topical Lidocaine 4% cream to wound bed prior to debridement 4. Dressing Applied: Santyl Ointment 5. Secondary Dressing Applied Bordered Foam Dressing Dry Gauze Electronic Signature(s) Signed: 07/22/2017 4:42:53 PM By: Alejandro MullingPinkerton, Debra Entered By: Alejandro MullingPinkerton, Debra on 07/22/2017 15:46:41 Speciale, Britta MccreedyBARBARA ([id number]) -------------------------------------------------------------------------------- Wound Assessment Details Patient Name: Mellody DanceGLASS, Emari Date of Service: 07/22/2017 3:45 PM Medical Record Number: [id number] Patient Account Number: [id number] Date of Birth/Sex: June 24, 1946 (71 y.o. Female) Treating RN: Ashok CordiaPinkerton, Debi Primary Care Zaydenn Balaguer: Garlon HatchetMCCONVILLE, ROBERT Other Clinician: Referring Calloway Andrus: Garlon HatchetMCCONVILLE, ROBERT Treating Amarah Brossman/Extender: Rudene ReBritto, Errol Weeks in Treatment: 2 Wound Status Wound Number: 3  Primary Diabetic Wound/Ulcer of the Lower Etiology: Extremity Wound Location: Right Toe Second - Dorsal Wound Healed - Epithelialized Wounding Event: Gradually Appeared Status: Date Acquired: 02/17/2017 Comorbid Sleep Apnea, Coronary Artery Disease, Weeks Of Treatment: 2 History: Hypertension, Myocardial Infarction, Clustered Wound: No Type II Diabetes, History of pressure wounds, Osteoarthritis, Neuropathy, Confinement Anxiety Photos Photo Uploaded By: Alejandro MullingPinkerton, Debra on 07/22/2017 16:11:56 Wound Measurements Length: (cm) 0 % Reduction i Width: (cm) 0 % Reduction i Depth: (cm) 0 Epithelializa Area: (cm) 0 Tunneling: Volume: (cm) 0 Undermining: n Area: 100% n Volume: 100% tion: Large (67-100%) No No Wound Description Classification: Grade 1 Foul Odor Aft Wound Margin: Flat and Intact Slough/Fibrin Exudate Amount: None Present er Cleansing: No o No Wound Bed Granulation Amount: None Present (0%) Necrotic Amount: None Present (0%) Periwound Skin Texture Morici, Christabell ([id number]) Texture Color No Abnormalities Noted: No No Abnormalities Noted: No Moisture Temperature / Pain No Abnormalities Noted: No Temperature: No Abnormality Tenderness on Palpation: Yes Wound Preparation Ulcer Cleansing: Rinsed/Irrigated with Saline Topical Anesthetic Applied: None Electronic Signature(s) Signed: 07/22/2017 4:42:53 PM By: Alejandro MullingPinkerton, Debra Entered By: Alejandro MullingPinkerton, Debra on 07/22/2017 15:51:15 Bransfield, Britta MccreedyBARBARA ([id number]) --------------------------------------------------------------------------------  Vitals Details Patient Name: MAGDELYN, ROEBUCK Date of Service: 07/22/2017 3:45 PM Medical Record Number: [id number] Patient Account Number: [id number] Date of Birth/Sex: January 11, 1946 (71 y.o. Female) Treating RN: Ashok Cordia, Debi Primary Care Kamilya Wakeman: Garlon Hatchet Other Clinician: Referring Anina Schnake: Garlon Hatchet Treating Jasman Pfeifle/Extender: Rudene Re in  Treatment: 2 Vital Signs Time Taken: 15:32 Temperature (F): 98.4 Height (in): 66 Pulse (bpm): 73 Weight (lbs): 191 Respiratory Rate (breaths/min): 18 Body Mass Index (BMI): 30.8 Blood Pressure (mmHg): 140/67 Reference Range: 80 - 120 mg / dl Electronic Signature(s) Signed: 07/22/2017 4:42:53 PM By: Alejandro Mulling Entered By: Alejandro Mulling on 07/22/2017 15:40:07

## 2017-07-26 ENCOUNTER — Ambulatory Visit: Payer: Self-pay | Admitting: *Deleted

## 2017-07-26 ENCOUNTER — Other Ambulatory Visit: Payer: Self-pay | Admitting: *Deleted

## 2017-07-26 NOTE — Progress Notes (Addendum)
TAMASHA, LAPLANTE ([id number]) Visit Report for 07/22/2017 Chief Complaint Document Details Patient Name: Jessica Donovan, Jessica Donovan Date of Service: 07/22/2017 3:45 PM Medical Record Number: [id number] Patient Account Number: [id number] Date of Birth/Sex: 24-Jan-1946 (71 y.o. Female) Treating RN: Phillis Haggis Primary Care Provider: Garlon Hatchet Other Clinician: Referring Provider: Garlon Hatchet Treating Provider/Extender: Rudene Re in Treatment: 2 Information Obtained from: Patient Chief Complaint Patients presents for treatment of an open diabetic ulcer to the right ankle the left ankle and the right third toe for about a month Electronic Signature(s) Signed: 07/22/2017 4:21:08 PM By: Evlyn Kanner MD, FACS Entered By: Evlyn Kanner on 07/22/2017 16:21:07 Jessica Donovan ([id number]) -------------------------------------------------------------------------------- HPI Details Patient Name: Jessica Donovan Date of Service: 07/22/2017 3:45 PM Medical Record Number: [id number] Patient Account Number: [id number] Date of Birth/Sex: 11-29-1946 (71 y.o. Female) Treating RN: Phillis Haggis Primary Care Provider: Garlon Hatchet Other Clinician: Referring Provider: Garlon Hatchet Treating Provider/Extender: Rudene Re in Treatment: 2 History of Present Illness HPI Description: 71 year old diabetic patient who has chronic back pain, collagen vascular disease, coronary artery disease, hypertension and history of osteomyelitis of the toe in February 2017. 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 She also has had joint replacement surgery in the past. She was recently reviewed by Primary care management team and had a problem with the toe on her right foot and her left ankle had minimal drainage, and the right ankle was draining worse. She was seen in the ER on 06/22/2017, and on the x-ray, there was no evidence of any acute bony  abnormality. 07/22/2017 -- she says she had a mild abrasion to her right second toe which was inadvertent but there is no open ulceration. Electronic Signature(s) Signed: 07/22/2017 4:21:38 PM By: Evlyn Kanner MD, FACS Previous Signature: 07/22/2017 3:37:15 PM Version By: Evlyn Kanner MD, FACS Previous Signature: 07/22/2017 3:36:19 PM Version By: Evlyn Kanner MD, FACS Entered By: Evlyn Kanner on 07/22/2017 16:21:38 Jessica Donovan, Jessica Donovan ([id number]) -------------------------------------------------------------------------------- Physical Exam Details Patient Name: Jessica Donovan Date of Service: 07/22/2017 3:45 PM Medical Record Number: [id number] Patient Account Number: [id number] Date of Birth/Sex: [birth year] (71 y.o. Female) Treating RN: Phillis Haggis Primary Care Provider: Garlon Hatchet Other Clinician: Referring Provider: Garlon Hatchet Treating Provider/Extender: Rudene Re in Treatment: 2 Constitutional . Pulse regular. Respirations normal and unlabored. Afebrile. . Eyes Nonicteric. Reactive to light. Ears, Nose, Mouth, and Throat Lips, teeth, and gums WNL.[name] Moist mucosa without lesions. Neck supple and nontender. No palpable supraclavicular or cervical adenopathy. Normal sized without goiter. Respiratory WNL. No retractions.. Cardiovascular Pedal Pulses WNL. No clubbing, cyanosis or edema. Lymphatic No adneopathy. No adenopathy. No adenopathy. Musculoskeletal Adexa without tenderness or enlargement.. Digits and nails w/o clubbing, cyanosis, infection, petechiae, ischemia, or inflammatory conditions.. Integumentary (Hair, Skin) No suspicious lesions. No crepitus or fluctuance. No peri-wound warmth or erythema. No masses.[name] Psychiatric Judgement and insight Intact.. No evidence of depression, anxiety, or agitation.. Notes both the right lateral malleolus and the left lateral malleolus are looking very clean and today did not require any sharp debridement except  for washing it out with moist saline gauze to remove superficial debris. Electronic Signature(s) Signed: 07/22/2017 4:22:16 PM By: Evlyn Kanner MD, FACS Entered By: Evlyn Kanner on 07/22/2017 16:22:16 Jessica Donovan ([id number]) -------------------------------------------------------------------------------- Physician Orders Details Patient Name: Jessica Donovan Date of Service: 07/22/2017 3:45 PM Medical Record Number: [id number] Patient Account Number: [id number] Date of Birth/Sex: 10/25/1946 (71 y.o. Female) Treating RN: Phillis Haggis Primary Care Provider: Garlon Hatchet Other Clinician: Referring  Provider: Garlon Hatchet Treating Provider/Extender: Rudene Re in Treatment: 2 Verbal / Phone Orders: Yes Clinician: Ashok Cordia, Debi Read Back and Verified: Yes Diagnosis Coding Wound Cleansing Wound #1 Left Malleolus o Cleanse wound with mild soap and water o May Shower, gently pat wound dry prior to applying new dressing. o May shower with protection. Wound #2 Right Malleolus o Cleanse wound with mild soap and water o May Shower, gently pat wound dry prior to applying new dressing. o May shower with protection. Primary Wound Dressing Wound #1 Left Malleolus o Prisma Ag Wound #2 Right Malleolus o Santyl Ointment Secondary Dressing Wound #1 Left Malleolus o Dry Gauze o Boardered Foam Dressing Wound #2 Right Malleolus o Dry Gauze o Boardered Foam Dressing Dressing Change Frequency Wound #1 Left Malleolus o Change dressing every other day. Wound #2 Right Malleolus o Change dressing every day. Follow-up Appointments Wound #1 Left Malleolus Ireland, Lynnelle ([id number]) o Return Appointment in 1 week. Wound #2 Right Malleolus o Return Appointment in 1 week. Additional Orders / Instructions Wound #1 Left Malleolus o Increase protein intake. o Activity as tolerated Wound #2 Right Malleolus o Increase protein  intake. o Activity as tolerated Electronic Signature(s) Signed: 07/22/2017 4:24:31 PM By: Evlyn Kanner MD, FACS Signed: 07/22/2017 4:42:53 PM By: Alejandro Mulling Entered By: Alejandro Mulling on 07/22/2017 15:53:04 Jessica Donovan, Jessica Donovan ([id number]) -------------------------------------------------------------------------------- Problem List Details Patient Name: Jessica Donovan Date of Service: 07/22/2017 3:45 PM Medical Record Number: [id number] Patient Account Number: [id number] Date of Birth/Sex: Nov 02, 1946 (70 y.o. 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 Female) Treating RN: Ashok Cordia, Debi Primary Care Provider: Garlon Hatchet Other Clinician: Referring Provider: Garlon Hatchet Treating Provider/Extender: Rudene Re in Treatment: 2 Subjective Chief Complaint Information obtained from Patient Patients presents for treatment of an open diabetic ulcer to the right ankle the left ankle and the right third toe for about a month History of Present  Illness (HPI) 71 year old diabetic patient who has chronic back pain, collagen vascular disease, coronary artery disease, hypertension and history of osteomyelitis of the toe in February 2017. 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 She also has had joint replacement surgery in the past. She was recently reviewed by Primary care management team and had a problem with the toe on her right foot and her left ankle had minimal drainage, and the right ankle was draining worse. She was seen in the ER on 06/22/2017, and on the x-ray, there was no evidence of any acute bony abnormality. 07/22/2017 -- she says she had a mild abrasion to her right second toe which was inadvertent but there is no open ulceration. Objective Constitutional Pulse regular. Respirations normal and unlabored. Afebrile. Vitals Time Taken: 3:32 PM, Height: 66 in, Weight: 191 lbs, BMI: 30.8, Temperature: 98.4 F, Pulse: 73 bpm, Respiratory Rate:  18 breaths/min, Blood Pressure: 140/67 mmHg. Eyes Nonicteric. Reactive to light. Ears, Nose, Mouth, and Throat Lips, teeth, and gums WNL.[name]. Moist mucosa without lesions. Jessica Donovan, Jessica Donovan ([id number]) Neck supple and nontender. No palpable supraclavicular or cervical adenopathy. Normal sized without goiter. Respiratory WNL. No retractions.. Cardiovascular Pedal Pulses WNL. No clubbing, cyanosis or edema. Lymphatic No adneopathy. No adenopathy. No adenopathy. Musculoskeletal Adexa without tenderness or enlargement.. Digits and nails w/o clubbing, cyanosis, infection, petechiae, ischemia, or inflammatory conditions.[name]. Psychiatric Judgement and insight Intact.. No evidence of depression, anxiety, or agitation.. General Notes: both the right lateral malleolus and the left lateral malleolus are looking very clean and today did not require any sharp debridement except for washing it out with moist saline gauze to remove superficial debris. Integumentary  (Hair, Skin) No suspicious lesions. No crepitus or fluctuance. No peri-wound warmth or erythema. No masses.. Wound #1 status is Open. Original cause of wound was Gradually Appeared. The wound is located on the Left Malleolus. The wound measures 0.1cm length x 0.1cm width x 0.1cm depth; 0.008cm^2 area and 0.001cm^3 volume. There is no tunneling or undermining noted. There is a small amount of serous drainage noted. The wound margin is flat and intact. There is large (67-100%) pink granulation within the wound bed. There is a small (1-33%) amount of necrotic tissue within the wound bed including Adherent Slough. Periwound temperature was noted as No Abnormality. The periwound has tenderness on palpation. Wound #2 status is Open. Original cause of wound was Gradually Appeared. The wound is located on the Right Malleolus. The wound measures 0.9cm length x 0.7cm width x 0.1cm depth; 0.495cm^2 area and 0.049cm^3 volume. There is no tunneling or undermining noted. There is a large amount of serous drainage noted. The wound margin is flat and intact. There is no granulation within the wound bed. There is a large (67-100%) amount of necrotic tissue within the wound bed including Adherent Slough. Periwound temperature was noted as No Abnormality. The periwound has tenderness on palpation. Wound #3 status is Healed - Epithelialized. Original cause of wound was Gradually Appeared. The wound is located on the Right,Dorsal Toe Second. The wound measures 0cm length x 0cm width x 0cm depth; 0cm^2 area and 0cm^3 volume. There is no tunneling or undermining noted. There is a none present amount of drainage noted. The wound margin is flat and intact. There is no granulation within the wound bed. There is no necrotic tissue within the wound bed. Periwound temperature was noted as No Abnormality. The periwound has tenderness on palpation. 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 May shower with protection. Wound #2 Right Malleolus: Cleanse wound with mild soap and water May Shower, gently pat wound dry prior to applying new dressing. May shower with protection. Primary Wound Dressing: Wound #1 Left Malleolus: Prisma Ag Wound #2 Right Malleolus: Santyl Ointment Secondary Dressing: Wound #1 Left Malleolus: Dry Gauze Boardered Foam Dressing Wound #2 Right Malleolus: Dry Gauze Boardered Foam Dressing Dressing Change Frequency: Wound #1 Left Malleolus: Change dressing every other day. Wound #2 Right Malleolus: Change dressing every day. Follow-up Appointments: Wound #1 Left Malleolus: Return Appointment in 1 week. Wound #2 Right Malleolus: Return  Appointment in 1 week. Additional Orders / Instructions: Jessica Donovan, Jessica Donovan ([id number]) Wound #1 Left Malleolus: Increase protein intake. Activity as tolerated Wound #2 Right Malleolus: Increase protein intake. Activity as tolerated After review of recommended: 1. Santyl ointment for the right lateral leg which she has already been using. 2. Silver collagen to be applied with a bordered foam to her left lateral ankle. 3. Good control of her diabetes mellitus 4. Adequate offloading of both these areas Electronic Signature(s) Signed: 07/22/2017 4:23:05 PM By: Evlyn Kanner MD, FACS Entered By: Evlyn Kanner on 07/22/2017 16:23:05 Jessica Donovan ([id number]) -------------------------------------------------------------------------------- SuperBill Details Patient Name: Jessica Donovan Date of Service: 07/22/2017 Medical Record Number: [id number] Patient Account Number:  [id number] Date of Birth/Sex: 06/05/1946 (71 y.o. 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 Facility Procedures CPT4 Code: [id number] Description: 99213 - WOUND CARE VISIT-LEV 3 EST PT Modifier: Quantity: 1 Physician Procedures CPT4 Code Description: [id number] 99213 - WC PHYS LEVEL 3 - EST PT ICD-10 Description Diagnosis E11.621 Type 2 diabetes mellitus with foot ulcer L97.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.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 left ankle with  fat l Modifier: layer expose ayer exposed Quantity: 1 d Electronic Signature(s) Signed: 07/22/2017 4:42:53 PM By: Alejandro Mulling Signed: 07/23/2017 4:02:51 PM By: Evlyn Kanner MD, FACS Previous Signature: 07/22/2017 4:23:20 PM Version By: Evlyn Kanner MD, FACS Entered By: Alejandro Mulling on 07/22/2017 16:34:14

## 2017-07-26 NOTE — Patient Outreach (Signed)
Unsuccessful telephone encounter to Mellody DanceBarbara Veale, 71 year old female.  This RN CM followed pt for Transition of care/recent hospitalization June 20-21,2018, program ended 07/12/17, call today- check on clinical status.   Unable to leave pt a voice message as message on phone- does not accept incoming calls..   Plan:  Follow up again telephonically  within next 4 days.    [name] M.   Pierzchala RN CCM [facility] Care Management  [phone]

## 2017-07-28 ENCOUNTER — Telehealth: Payer: Self-pay | Admitting: Gastroenterology

## 2017-07-28 ENCOUNTER — Other Ambulatory Visit: Payer: Self-pay | Admitting: *Deleted

## 2017-07-28 NOTE — Telephone Encounter (Signed)
1. Stool for C diff and pcr 2. I couldn't complete her colonoscopy due to tortiosity at the sigmoid- this may be the cause of diarrhea  Need either CT colonography ideally or barium enema if not covered by insurance

## 2017-07-28 NOTE — Patient Outreach (Signed)
Second unsuccessful telephone encounter to Mellody DanceBarbara Timson, 71 year old female- check on clinical status.   This RN CM followed pt for transition of care/recent hospitalization June 20-21,2018 -program ended 07/12/17.    Unable to leave a voice message as message on phone at prescriber's request does not accept incoming calls.     Plan:  RN CM to reach out to [name] (on consent form).    [name] M.   Ambra Haverstick RN CCM [facility] Care Management  [phone]  Note: view in Epic today- note with  Holley Dexterracy Tekely, pt provided contact number [phone].  RN CM to try calling pt with this number within next 2 days.    [name] M.   Rashana Andrew RN CCM [facility] Care [facility]  [phone]

## 2017-07-28 NOTE — Telephone Encounter (Signed)
Patient Jessica Donovan and is having severe diarrhea for a week now and has been taking Diphenoxyla 2.5 q4h and is not helping. She feels extremely weak. Please call her at [phone].

## 2017-07-30 ENCOUNTER — Other Ambulatory Visit: Payer: Self-pay | Admitting: *Deleted

## 2017-07-30 ENCOUNTER — Encounter: Payer: PPO | Admitting: Physician Assistant

## 2017-07-30 DIAGNOSIS — E11622 Type 2 diabetes mellitus with other skin ulcer: Secondary | ICD-10-CM | POA: Diagnosis not present

## 2017-07-30 DIAGNOSIS — L97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 layer exposed: Secondary | ICD-10-CM | POA: Diagnosis not present

## 2017-07-30 DIAGNOSIS — E11621 Type 2 diabetes mellitus with foot ulcer: Secondary | ICD-10-CM | POA: Diagnosis not present

## 2017-07-30 NOTE — Telephone Encounter (Signed)
LVM on sister Lonell GrandchildJean Grady's phone for patient to callback.   Patient's phone is not receiving incoming calls.

## 2017-07-30 NOTE — Patient Outreach (Signed)
Third unsuccessful telephone encounter to [name], 71 year old female, check on clinical status, followed pt for transition of care/recent hospitalization June 20-21,2018 - program completed 07/12/17.   [place] in [place], 07/28/17  [name]'s note-  Pt requested call her at [phone] so at 11:19 am called this number-  HIPPA compliant voice message left with contact name and number.  Also tried pt's phone number [phone] again at 11:20 am- message on phone does not accept incoming calls.  Plan:  RN CM to call [name] (resident director, on consent form).    [name] M.   Arlene Brickel RN CCM [facility] Care [facility]  [phone]

## 2017-07-30 NOTE — Patient Outreach (Signed)
[facility]uccess[facility](resident service coordinator, [facility]), attempt to contact as unable to reach pt- phone not accepting incoming calls.  HIPAA compliant voice message left with contact name and number.    [name] M.   Taylar Hartsough RN CCM [facility] Care Management  [phone]

## 2017-08-02 ENCOUNTER — Other Ambulatory Visit: Payer: Self-pay | Admitting: *Deleted

## 2017-08-02 NOTE — Progress Notes (Signed)
AMIAYAH, GIEBEL ([id number]) Visit Report for 07/30/2017 Arrival Information Details Patient Name: Jessica Donovan Date of Service: 07/30/2017 1:30 PM Medical Record Number: [id number] Patient Account Number: [id number] Date of Birth/Sex: [birth year] (71 y.o. Female) Treating RN: Ashok Cordia, Debi Primary Care Jadrien Narine: Garlon Hatchet Other Clinician: Referring Minyon Billiter: Garlon Hatchet Treating Amayrani Bennick/Extender: Linwood Dibbles, HOYT Weeks in Treatment: 3 Visit Information History Since Last Visit All ordered tests and consults were completed: No Patient Arrived: Dan Humphreys Added or deleted any medications: No Arrival Time: 13:22 Any new allergies or adverse reactions: No Accompanied By: self Had a fall or experienced change in No Transfer Assistance: None activities of daily living that may affect Patient Identification Verified: Yes risk of falls: Secondary Verification Process Yes Signs or symptoms of abuse/neglect since last No Completed: visito Patient Requires Transmission- No Hospitalized since last visit: No Based Precautions: Has Dressing in Place as Prescribed: Yes Patient Has Alerts: Yes Pain Present Now: No Patient Alerts: Patient on Blood Thinner ASA Electronic Signature(s) Signed: 07/30/2017 4:51:41 PM By: Alejandro Mulling Entered By: Alejandro Mulling on 07/30/2017 13:23:10 Enamorado, [name] ([id number]) -------------------------------------------------------------------------------- Encounter Discharge Information Details Patient Name: Jessica Donovan Date of Service: 07/30/2017 1:30 PM Medical Record Number: [id number] Patient Account Number: [id number] Date of Birth/Sex: [birth year] (71 y.o. Female) Treating RN: Ashok Cordia, Debi Primary Care Gurley Climer: Garlon Hatchet Other Clinician: Referring Amabel Stmarie: Garlon Hatchet Treating Jaleeah Slight/Extender: Linwood Dibbles, HOYT Weeks in Treatment: 3 Encounter Discharge Information Items Discharge Pain Level: 0 Discharge  Condition: Stable Ambulatory Status: Walker Discharge Destination: Home Private Transportation: Auto Accompanied By: self Schedule Follow-up Appointment: Yes Medication Reconciliation completed and No provided to Patient/Care Daliana Leverett: Clinical Summary of Care: Electronic Signature(s) Signed: 07/30/2017 4:51:41 PM By: Alejandro Mulling Entered By: Alejandro Mulling on 07/30/2017 13:55:51 Stampley, [name] ([id number]) -------------------------------------------------------------------------------- Lower Extremity Assessment Details Patient Name: Jessica Donovan Date of Service: 07/30/2017 1:30 PM Medical Record Number: [id number] Patient Account Number: [id number] Date of Birth/Sex: January 20, 1946 (71 y.o. Female) Treating RN: Ashok Cordia, Debi Primary Care Shajuana Mclucas: Garlon Hatchet Other Clinician: Referring Rivky Clendenning: Garlon Hatchet Treating Daronte Shostak/Extender: Linwood Dibbles, HOYT Weeks in Treatment: 3 Vascular Assessment Pulses: Dorsalis Pedis Palpable: [Left:Yes] [Right:Yes] Posterior Tibial Extremity colors, hair growth, and conditions: Extremity Color: [Left:Normal] [Right:Normal] Temperature of Extremity: [Left:Warm] [Right:Warm] Capillary Refill: [Left:< 3 seconds] [Right:< 3 seconds] Toe Nail Assessment Left: Right: Thick: No No Discolored: Yes Yes Deformed: Yes Yes Improper Length and Hygiene: No No Electronic Signature(s) Signed: 07/30/2017 4:51:41 PM By: Alejandro Mulling Entered By: Alejandro Mulling on 07/30/2017 13:36:11 Thobe, [name] ([id number]) -------------------------------------------------------------------------------- Multi Wound Chart Details Patient Name: Jessica Donovan Date of Service: 07/30/2017 1:30 PM Medical Record Number: [id number] Patient Account Number: [id number] Date of Birth/Sex: [birth year] (70 y.o. Female) Treating RN: Ashok Cordia, Debi Primary Care Moriyah Byington: Garlon Hatchet Other Clinician: Referring Nahome Bublitz: Garlon Hatchet Treating  Ulis Kaps/Extender: STONE III, HOYT Weeks in Treatment: 3 Vital Signs Height(in): 66 Pulse(bpm): 79 Weight(lbs): 191 Blood Pressure 140/60 (mmHg): Body Mass Index(BMI): 31 Temperature(F): 98.4 Respiratory Rate 18 (breaths/min): Photos: [1:No Photos] [2:No Photos] [4:No Photos] Wound Location: [1:Left Malleolus] [2:Right Malleolus] [4:Toe Second] Wounding Event: [1:Gradually Appeared] [2:Gradually Appeared] [4:Trauma] Primary Etiology: [1:Diabetic Wound/Ulcer of the Lower Extremity] [2:Diabetic Wound/Ulcer of the Lower Extremity] [4:Diabetic Wound/Ulcer of the Lower Extremity] Comorbid History: [1:Sleep Apnea, Coronary Artery Disease, Hypertension, Myocardial Infarction, Type II Diabetes, History of pressure wounds, Osteoarthritis, Neuropathy, Confinement Anxiety] [2:Sleep Apnea, Coronary Artery Disease, Hypertension,  Myocardial Infarction, Type II Diabetes, History of pressure wounds, Osteoarthritis, Neuropathy, Confinement Anxiety] [4:Sleep Apnea, Coronary Artery Disease, Hypertension, Myocardial Infarction, Type II Diabetes, History of pressure  wounds,  Osteoarthritis, Neuropathy, Confinement Anxiety] Date Acquired: [1:02/17/2017] [2:02/17/2017] [4:07/24/2017] Weeks of Treatment: [1:3] [2:3] [4:0] Wound Status: [1:Open] [2:Open] [4:Open] Measurements L x W x D 0.2x0.3x0.1 [2:0.5x0.7x0.2] [4:0.3x0.4x0.1] (cm) Area (cm) : [1:0.047] [2:0.275] [4:0.094] Volume (cm) : [1:0.005] [2:0.055] [4:0.009] % Reduction in Area: [1:86.70%] [2:77.60%] [4:N/A] % Reduction in Volume: 85.70% [2:55.30%] [4:N/A] Classification: [1:Grade 1] [2:Grade 1] [4:Grade 1] Exudate Amount: [1:Medium] [2:Large] [4:Medium] Exudate Type: [1:Serous] [2:Serous] [4:Serosanguineous] Exudate Color: [1:amber] [2:amber] [4:red, brown] Wound Margin: [1:Flat and Intact] [2:Flat and Intact] [4:Flat and Intact] Granulation Amount: [1:Medium (34-66%)] [2:None Present (0%)] [4:Large (67-100%)] Granulation Quality: [1:Pink]  [2:N/A] [4:Red] Necrotic Amount: Medium (34-66%) Large (67-100%) None Present (0%) Epithelialization: None None None Periwound Skin Texture: No Abnormalities Noted No Abnormalities Noted No Abnormalities Noted Periwound Skin No Abnormalities Noted No Abnormalities Noted No Abnormalities Noted Moisture: Periwound Skin Color: No Abnormalities Noted No Abnormalities Noted No Abnormalities Noted Temperature: No Abnormality No Abnormality No Abnormality Tenderness on Yes Yes Yes Palpation: Wound Preparation: Ulcer Cleansing: Ulcer Cleansing: Ulcer Cleansing: Rinsed/Irrigated with Rinsed/Irrigated with Rinsed/Irrigated with Saline Saline Saline Topical Anesthetic Topical Anesthetic Topical Anesthetic Applied: Other: lidocaine Applied: Other: lidocaine Applied: Other: lidocaine 4% 4% 4% Treatment Notes Electronic Signature(s) Signed: 07/30/2017 4:51:41 PM By: Alejandro Mulling Entered By: Alejandro Mulling on 07/30/2017 13:36:22 LAPORSHIA, HOGEN ([id number]) -------------------------------------------------------------------------------- Multi-Disciplinary Care Plan Details Patient Name: Jessica Donovan Date of Service: 07/30/2017 1:30 PM Medical Record Number: [id number] Patient Account Number: [id number] Date of Birth/Sex: [birth year] (71 y.o. Female) Treating RN: Ashok Cordia, Debi Primary Care Anaijah Augsburger: Garlon Hatchet Other Clinician: Referring Oline Belk: Garlon Hatchet Treating Saliha Salts/Extender: Linwood Dibbles, HOYT Weeks in Treatment: 3 Active Inactive ` Orientation to the Wound Care Program Nursing Diagnoses: Knowledge deficit related to the wound healing center program Goals: Patient/caregiver will verbalize understanding of the Wound Healing Center Program Date Initiated: 07/08/2017 Target Resolution Date: 10/08/2017 Goal Status: Active Interventions: Provide education on orientation to the wound center Notes: ` Peripheral Neuropathy Nursing Diagnoses: Knowledge deficit  related to disease process and management of peripheral neurovascular dysfunction Potential alteration in peripheral tissue perfusion (select prior to confirmation of diagnosis) Goals: Patient/caregiver will verbalize understanding of disease process and disease management Date Initiated: 07/08/2017 Target Resolution Date: 10/08/2017 Goal Status: Active Interventions: Assess signs and symptoms of neuropathy upon admission and as needed Provide education on Management of Neuropathy and Related Ulcers Provide education on Management of Neuropathy upon discharge from the Wound Center Treatment Activities: Patient referred for customized footwear/offloading : 07/08/2017 Patient referred to diabetes educator : 07/08/2017 Jessica Donovan ([id number]) Notes: ` Wound/Skin Impairment Nursing Diagnoses: Impaired tissue integrity Knowledge deficit related to ulceration/compromised skin integrity Goals: Patient/caregiver will verbalize understanding of skin care regimen Date Initiated: 07/08/2017 Target Resolution Date: 10/08/2017 Goal Status: Active Ulcer/skin breakdown will have a volume reduction of 30% by week 4 Date Initiated: 07/08/2017 Target Resolution Date: 10/08/2017 Goal Status: Active Ulcer/skin breakdown will have a volume reduction of 50% by week 8 Date Initiated: 07/08/2017 Target Resolution Date: 10/08/2017 Goal Status: Active Ulcer/skin breakdown will have a volume reduction of 80% by week 12 Date Initiated: 07/08/2017 Target Resolution Date: 10/08/2017 Goal Status: Active Ulcer/skin breakdown will heal within 14 weeks Date Initiated: 07/08/2017 Target Resolution Date: 10/08/2017 Goal Status: Active Interventions: Assess patient/caregiver ability to obtain necessary supplies Assess patient/caregiver ability to perform ulcer/skin care regimen upon admission and as needed Assess ulceration(s) every visit Treatment Activities: Referred to DME Kyian Obst for dressing supplies :  07/08/2017 Skin care regimen initiated : 07/08/2017 Topical wound management initiated : 07/08/2017  Notes: Electronic Signature(s) Signed: 07/30/2017 4:51:41 PM By: Alejandro Mulling Entered By: Alejandro Mulling on 07/30/2017 13:36:15 Telfair, [name] ([id number]) -------------------------------------------------------------------------------- Pain Assessment Details Patient Name: Jessica Donovan Date of Service: 07/30/2017 1:30 PM Medical Record Number: [id number] Patient Account Number: [id number] Date of Birth/Sex: 05-22-1946 (71 y.o. Female) Treating RN: Ashok Cordia, Debi Primary Care Bush Murdoch: Garlon Hatchet Other Clinician: Referring Nelvin Tomb: Garlon Hatchet Treating Olajuwon Fosdick/Extender: Linwood Dibbles, HOYT Weeks in Treatment: 3 Active Problems Location of Pain Severity and Description of Pain Patient Has Paino No Site Locations With Dressing Change: No Pain Management and Medication Current Pain Management: Electronic Signature(s) Signed: 07/30/2017 4:51:41 PM By: Alejandro Mulling Entered By: Alejandro Mulling on 07/30/2017 13:23:16 Jessica Donovan ([id number]) -------------------------------------------------------------------------------- Patient/Caregiver Education Details Patient Name: Jessica Donovan Date of Service: 07/30/2017 1:30 PM Medical Record Number: [id number] Patient Account Number: [id number] Date of Birth/Gender: 10-Sep-1946 (71 y.o. Female) Treating RN: Ashok Cordia, Debi Primary Care Physician: Garlon Hatchet Other Clinician: Referring Physician: Garlon Hatchet Treating Physician/Extender: Skeet Simmer in Treatment: 3 Education Assessment Education Provided To: Patient Education Topics Provided Wound/Skin Impairment: Handouts: Other: change dressing as ordered Methods: Demonstration, Explain/Verbal Responses: State content correctly Electronic Signature(s) Signed: 07/30/2017 4:51:41 PM By: Alejandro Mulling Entered By: Alejandro Mulling on  07/30/2017 13:55:55 Wigington, [name] ([id number]) -------------------------------------------------------------------------------- Wound Assessment Details Patient Name: Jessica Donovan Date of Service: 07/30/2017 1:30 PM Medical Record Number: [id number] Patient Account Number: [id number] Date of Birth/Sex: [birth year] (70 y.o. Female) Treating RN: Ashok Cordia, Debi Primary Care Briceyda Abdullah: Garlon Hatchet Other Clinician: Referring Araeya Lamb: Garlon Hatchet Treating Jonella Redditt/Extender: STONE III, HOYT Weeks in Treatment: 3 Wound Status Wound Number: 1 Primary Diabetic Wound/Ulcer of the Lower Etiology: Extremity Wound Location: Left Malleolus Wound Open Wounding Event: Gradually Appeared Status: Date Acquired: 02/17/2017 Comorbid Sleep Apnea, Coronary Artery Disease, Weeks Of Treatment: 3 History: Hypertension, Myocardial Infarction, Clustered Wound: No Type II Diabetes, History of pressure wounds, Osteoarthritis, Neuropathy, Confinement Anxiety Photos Photo Uploaded By: Alejandro Mulling on 07/30/2017 16:31:42 Wound Measurements Length: (cm) 0.2 Width: (cm) 0.3 Depth: (cm) 0.1 Area: (cm) 0.047 Volume: (cm) 0.005 % Reduction in Area: 86.7% % Reduction in Volume: 85.7% Epithelialization: None Tunneling: No Undermining: No Wound Description Classification: Grade 1 Wound Margin: Flat and Intact Exudate Amount: Medium Exudate Type: Serous Exudate Color: amber Foul Odor After Cleansing: No Slough/Fibrino Yes Wound Bed Granulation Amount: Medium (34-66%) Granulation Quality: Pink Vu, Azya ([id number]) Necrotic Amount: Medium (34-66%) Necrotic Quality: Adherent Slough Periwound Skin Texture Texture Color No Abnormalities Noted: No No Abnormalities Noted: No Moisture Temperature / Pain No Abnormalities Noted: No Temperature: No Abnormality Tenderness on Palpation: Yes Wound Preparation Ulcer Cleansing: Rinsed/Irrigated with Saline Topical Anesthetic  Applied: Other: lidocaine 4%, Treatment Notes Wound #1 (Left Malleolus) 1. Cleansed with: Clean wound with Normal Saline 2. Anesthetic Topical Lidocaine 4% cream to wound bed prior to debridement 3. Peri-wound Care: Skin Prep 4. Dressing Applied: Prisma Ag 5. Secondary Dressing Applied Bordered Foam Dressing Dry Gauze Electronic Signature(s) Signed: 07/30/2017 4:51:41 PM By: Alejandro Mulling Entered By: Alejandro Mulling on 07/30/2017 13:33:48 Zabawa, [name] ([id number]) -------------------------------------------------------------------------------- Wound Assessment Details Patient Name: Jessica Donovan Date of Service: 07/30/2017 1:30 PM Medical Record Number: [id number] Patient Account Number: [id number] Date of Birth/Sex: Oct 28, 1946 (71 y.o. Female) Treating RN: Ashok Cordia, Debi Primary Care Betty Daidone: Garlon Hatchet Other Clinician: Referring Reigna Ruperto: Garlon Hatchet Treating Naina Sleeper/Extender: STONE III, HOYT Weeks in Treatment: 3 Wound Status Wound Number: 2 Primary Diabetic Wound/Ulcer of the Lower Etiology: Extremity Wound Location: Right Malleolus Wound Open Wounding Event: Gradually Appeared Status: Date Acquired: 02/17/2017 Comorbid Sleep  Apnea, Coronary Artery Disease, Weeks Of Treatment: 3 History: Hypertension, Myocardial Infarction, Clustered Wound: No Type II Diabetes, History of pressure wounds, Osteoarthritis, Neuropathy, Confinement Anxiety Photos Photo Uploaded By: Alejandro MullingPinkerton, Debra on 07/30/2017 16:32:14 Wound Measurements Length: (cm) 0.5 Width: (cm) 0.7 Depth: (cm) 0.2 Area: (cm) 0.275 Volume: (cm) 0.055 % Reduction in Area: 77.6% % Reduction in Volume: 55.3% Epithelialization: None Tunneling: No Undermining: No Wound Description Classification: Grade 1 Wound Margin: Flat and Intact Exudate Amount: Large Exudate Type: Serous Exudate Color: amber Foul Odor After Cleansing: No Slough/Fibrino Yes Wound Bed Granulation Amount:  None Present (0%) Necrotic Amount: Large (67-100%) Lacorte, Jenascia ([id number]) Necrotic Quality: Adherent Slough Periwound Skin Texture Texture Color No Abnormalities Noted: No No Abnormalities Noted: No Moisture Temperature / Pain No Abnormalities Noted: No Temperature: No Abnormality Tenderness on Palpation: Yes Wound Preparation Ulcer Cleansing: Rinsed/Irrigated with Saline Topical Anesthetic Applied: Other: lidocaine 4%, Treatment Notes Wound #2 (Right Malleolus) 1. Cleansed with: Clean wound with Normal Saline 2. Anesthetic Topical Lidocaine 4% cream to wound bed prior to debridement 3. Peri-wound Care: Skin Prep 4. Dressing Applied: Santyl Ointment 5. Secondary Dressing Applied Bordered Foam Dressing Dry Gauze Electronic Signature(s) Signed: 07/30/2017 4:51:41 PM By: Alejandro MullingPinkerton, Debra Entered By: Alejandro MullingPinkerton, Debra on 07/30/2017 13:34:25 Brandau, Britta MccreedyBARBARA ([id number]) -------------------------------------------------------------------------------- Vitals Details Patient Name: Jessica DanceGLASS, Brittania Date of Service: 07/30/2017 1:30 PM Medical Record Number: [id number] Patient Account Number: [id number] Date of Birth/Sex: [birth year] (71 y.o. Female) Treating RN: Ashok CordiaPinkerton, Debi Primary Care Damondre Pfeifle: Garlon HatchetMCCONVILLE, ROBERT Other Clinician: Referring Marleni Gallardo: Garlon HatchetMCCONVILLE, ROBERT Treating Amarys Sliwinski/Extender: Linwood DibblesSTONE III, HOYT Weeks in Treatment: 3 Vital Signs Time Taken: 13:23 Temperature (F): 98.4 Height (in): 66 Pulse (bpm): 79 Weight (lbs): 191 Respiratory Rate (breaths/min): 18 Body Mass Index (BMI): 30.8 Blood Pressure (mmHg): 140/60 Reference Range: 80 - 120 mg / dl Electronic Signature(s) Signed: 07/30/2017 4:51:41 PM By: Alejandro MullingPinkerton, Debra Entered By: Alejandro MullingPinkerton, Debra on 07/30/2017 13:25:09

## 2017-08-02 NOTE — Progress Notes (Signed)
Jessica Donovan, Jessica Donovan ([id number]) Visit Report for 07/30/2017 Chief Complaint Document Details Patient Name: Jessica Donovan, Jessica Donovan Date of Service: 07/30/2017 1:30 PM Medical Record Number: [id number] Patient Account Number: [id number] Date of Birth/Sex: 06-Apr-1946 (71 y.o. Female) Treating RN: Ashok Cordia, Debi Primary Care Provider: Garlon Hatchet Other Clinician: Referring Provider: Garlon Hatchet Treating Provider/Extender: Linwood Dibbles, HOYT Weeks in Treatment: 3 Information Obtained from: Patient Chief Complaint Patients presents for treatment of an open diabetic ulcer to the right ankle the left ankle and the right third toe Electronic Signature(s) Signed: 08/02/2017 8:34:49 AM By: Lenda Kelp PA-C Entered By: Lenda Kelp on 07/30/2017 14:46:36 Whitford, Jessica Donovan ([id number]) -------------------------------------------------------------------------------- Debridement Details Patient Name: Jessica Donovan Date of Service: 07/30/2017 1:30 PM Medical Record Number: [id number] Patient Account Number: [id number] Date of Birth/Sex: 01/24/1946 (70 y.o. Female) Treating RN: Ashok Cordia, Debi Primary Care Provider: Garlon Hatchet Other Clinician: Referring Provider: Garlon Hatchet Treating Provider/Extender: Linwood Dibbles, HOYT Weeks in Treatment: 3 Debridement Performed for Wound #2 Right Malleolus Assessment: Performed By: Physician STONE III, HOYT E., PA-C Debridement: Debridement Severity of Tissue Pre Fat layer exposed Debridement: Pre-procedure Verification/Time Out Yes - 13:50 Taken: Start Time: 13:51 Pain Control: Lidocaine 4% Topical Solution Level: Skin/Subcutaneous Tissue Total Area Debrided (L x 0.5 (cm) x 0.7 (cm) = 0.35 (cm) W): Tissue and other Viable, Non-Viable, Exudate, Fibrin/Slough, Subcutaneous material debrided: Instrument: Curette Bleeding: Minimum Hemostasis Achieved: Pressure End Time: 13:53 Procedural Pain: 0 Post Procedural Pain: 0 Response to  Treatment: Procedure was tolerated well Post Debridement Measurements of Total Wound Length: (cm) 0.5 Width: (cm) 0.7 Depth: (cm) 0.3 Volume: (cm) 0.082 Character of Wound/Ulcer Post Requires Further Debridement Debridement: Severity of Tissue Post Debridement: Fat layer exposed Post Procedure Diagnosis Same as Pre-procedure Electronic Signature(s) Signed: 07/30/2017 4:51:41 PM By: Alejandro Mulling Signed: 08/02/2017 8:34:49 AM By: Jeneen Rinks, Jessica Donovan ([id number]) Entered By: Alejandro Mulling on 07/30/2017 13:54:55 Garay, Jessica Donovan ([id number]) -------------------------------------------------------------------------------- HPI Details Patient Name: Jessica Donovan Date of Service: 07/30/2017 1:30 PM Medical Record Number: [id number] Patient Account Number: [id number] Date of Birth/Sex: March 22, 1946 (71 y.o. Female) Treating RN: Phillis Haggis Primary Care Provider: Garlon Hatchet Other Clinician: Referring Provider: Garlon Hatchet Treating Provider/Extender: Linwood Dibbles, HOYT Weeks in Treatment: 3 History of Present Illness HPI Description: 71 year old diabetic patient who has chronic back pain, collagen vascular disease, coronary artery disease, hypertension and history of osteomyelitis of the toe in February 2017. 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 She also has had joint replacement surgery in the past. She was recently reviewed by Primary care management team and had a problem with the toe on her right foot and her left ankle had minimal drainage, and the right ankle was draining worse. She was seen in the ER on 06/22/2017, and on the x-ray, there was no evidence of any acute bony abnormality. 07/22/2017 -- she says she had a mild abrasion to her right second toe which was inadvertent but there is no open ulceration. 07/30/17 on evaluation today patient appears to be doing better in regard to her abrasion to the right  second toe. It appears there's little bit of a pressure injury although there is no opening at this point. I think this is pushing up on her shoe which is causing the issue at this point and that's due to the contraction that happening with her toe. With that being said patient wonders if there's anything that can be done in that regard for this. She has been tolerating the  dressing changes without any complication and is having really no pain. Her right lateral heel wound appears to be slough covered though there is no evidence of infection. No fevers, chills, nausea, or vomiting noted at this time. Electronic Signature(s) Signed: 08/02/2017 8:34:49 AM By: Lenda Kelp PA-C Entered By: Lenda Kelp on 07/30/2017 14:51:00 Jessica Donovan ([id number]) -------------------------------------------------------------------------------- Physical Exam Details Patient Name: Jessica Donovan Date of Service: 07/30/2017 1:30 PM Medical Record Number: [id number] Patient Account Number: [id number] Date of Birth/Sex: 1946-03-07 (71 y.o. Female) Treating RN: Ashok Cordia, Debi Primary Care Provider: Garlon Hatchet Other Clinician: Referring Provider: Garlon Hatchet Treating Provider/Extender: STONE III, HOYT Weeks in Treatment: 3 Constitutional Well-nourished and well-hydrated in no acute distress. Respiratory normal breathing without difficulty. Psychiatric this patient is able to make decisions and demonstrates good insight into disease process. Alert and Oriented x 3. pleasant and cooperative. Notes Patient's left ankle wound is doing very well and there is no evidence of infection this is almost closed. The right ankle wound is slough covered and did required debridement today which he tolerated without complication. The second toe wound appears close this is at the last over but appears to still be getting some pressure to the site. Electronic Signature(s) Signed: 08/02/2017 8:34:49 AM  By: Lenda Kelp PA-C Entered By: Lenda Kelp on 07/30/2017 14:51:41 Jessica Donovan ([id number]) -------------------------------------------------------------------------------- Physician Orders Details Patient Name: Jessica Donovan Date of Service: 07/30/2017 1:30 PM Medical Record Number: [id number] Patient Account Number: [id number] Date of Birth/Sex: [birth year] (70 y.o. 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 o May shower with protection. Wound #2 Right Malleolus o Cleanse wound with mild soap and water o May Shower, gently pat wound dry prior to applying new dressing. o May shower with protection. Anesthetic Wound #1 Left Malleolus o Topical Lidocaine 4% cream applied to wound bed prior to debridement Wound #2 Right Malleolus o Topical Lidocaine 4% cream applied to wound bed prior to debridement Skin Barriers/Peri-Wound Care Wound #1 Left Malleolus o Skin Prep Wound #2 Right Malleolus o Skin Prep Primary Wound Dressing Wound #1 Left Malleolus o Prisma Ag Wound #2 Right Malleolus Jessica Donovan, Jessica Donovan ([id number]) o Santyl Ointment Secondary Dressing Wound #1 Left Malleolus o Dry Gauze o Boardered Foam Dressing Wound #2 Right Malleolus o Dry Gauze o Boardered Foam Dressing Dressing Change Frequency Wound #1  Left Malleolus o Change dressing every other day. Wound #2 Right Malleolus o Change dressing every day. Follow-up Appointments Wound #1 Left Malleolus o Return Appointment in 1 week. Wound #2 Right Malleolus o Return Appointment in 1 week. Edema Control Wound #1 Left Malleolus o Elevate legs to the level of the heart and pump ankles as often as possible Wound #2 Right Malleolus o Elevate legs to the level of the heart and pump ankles as often as possible Additional Orders / Instructions Wound #1 Left Malleolus o Increase protein intake. o Activity as tolerated Wound #2 Right Malleolus o Increase protein intake. o  Activity as tolerated Notes At this point I'm gonna recommend that we have the second toe on the right foot in order to help prevent this from rubbing on the top of her shoe. I'm also going to look into for her to see about potential Botox therapy to try to loosen up the contracture that is occurring. I will also check on if there any other positions that may be able to manage this type of situation. Otherwise we will see her for reevaluation of what things Jessica Donovan, Jessica Donovan ([id number]) stand at that point. She has been worsening in the interim she will contact our office for additional recommendations otherwise please see above for specific wound care orders. Electronic Signature(s) Signed: 08/02/2017 8:34:49 AM By: Lenda Kelp PA-C Entered By: Lenda Kelp on 07/30/2017 14:52:45 Jessica Donovan, Jessica Donovan ([id number]) -------------------------------------------------------------------------------- Problem List Details Patient Name: Jessica Donovan Date of Service: 07/30/2017 1:30 PM Medical Record Number: [id number] Patient Account Number: [id number] Date of Birth/Sex: [birth year] (71 y.o. Female) Treating RN: Ashok Cordia, Debi Primary Care Provider: Garlon Hatchet Other Clinician: Referring Provider: Garlon Hatchet Treating Provider/Extender: Linwood Dibbles, HOYT Weeks in Treatment: 3 Active Problems ICD-10 Encounter Code Description Active Date Diagnosis E11.621 Type 2 diabetes mellitus with foot ulcer 07/08/2017 Yes L97.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 layer 07/08/2017 Yes exposed L97.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 left ankle with fat layer 07/08/2017 Yes exposed Inactive Problems Resolved Problems Electronic Signature(s) Signed: 08/02/2017 8:34:49 AM By: Lenda Kelp PA-C Entered By: Lenda Kelp on 07/30/2017 13:39:52 Jessica Donovan, Jessica Donovan ([id number]) -------------------------------------------------------------------------------- Progress Note Details Patient Name: Jessica Donovan Date of Service: 07/30/2017 1:30 PM Medical Record Number: [id number] Patient Account Number: [id number] Date of Birth/Sex: Sep 03, 1946 (71 y.o. Female) Treating RN: Ashok Cordia, Debi Primary Care Provider: Garlon Hatchet Other Clinician: Referring Provider: Garlon Hatchet Treating Provider/Extender: Linwood Dibbles, HOYT Weeks in Treatment: 3 Subjective Chief Complaint Information obtained from Patient Patients presents for treatment of an open diabetic ulcer to the right ankle the left ankle and the right third toe History of Present Illness (HPI) 71 year old diabetic patient who has chronic back pain, collagen vascular disease, coronary artery disease, hypertension and history of osteomyelitis of the toe in February 2017. 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 She also has had joint replacement surgery in the past. She was recently reviewed by Primary care management team and had a problem with the toe on her right foot and her left ankle had minimal drainage, and the right ankle was draining worse. She was seen in the ER on 06/22/2017, and on the x-ray, there was no evidence of any acute bony abnormality. 07/22/2017 -- she says she had a mild abrasion to her right second toe which was  inadvertent but there is no open ulceration. 07/30/17 on evaluation today patient appears to be doing better in regard to her abrasion to the right second toe. It appears there's little bit of a pressure injury although there is no opening at this point. I think this is pushing up on her shoe which is causing the issue at this point and that's due to the contraction that happening with her toe. With that being said patient wonders if there's anything that can be done in that regard for this. She has been tolerating the dressing changes without any complication and is having really no pain. Her right lateral heel wound appears to be slough covered though there is no evidence of infection. No fevers, chills, nausea,  or vomiting noted at this time. Objective Constitutional Well-nourished and well-hydrated in no acute distress. Vitals Time Taken: 1:23 PM, Height: 66 in, Weight: 191 lbs, BMI: 30.8, Temperature: 98.4 F, Pulse: 79 Jessica Donovan, Jessica Donovan ([id number]) bpm, Respiratory Rate: 18 breaths/min, Blood Pressure: 140/60 mmHg. Respiratory normal breathing without difficulty. Psychiatric this patient is able to make decisions and demonstrates good insight into disease process. Alert and Oriented x 3. pleasant and cooperative. General Notes: Patient's left ankle wound is doing very well and there is no evidence of infection this is almost closed. The right ankle wound is slough covered and did required debridement today which he tolerated without complication. The second toe wound appears close this is at the last over but appears to still be getting some pressure to the site. Integumentary (Hair, Skin) Wound #1 status is Open. Original cause of wound was Gradually Appeared. The wound is located on the Left Malleolus. The wound measures 0.2cm length x 0.3cm width x 0.1cm depth; 0.047cm^2 area and 0.005cm^3 volume. There is no tunneling or undermining noted. There is a medium amount of  serous drainage noted. The wound margin is flat and intact. There is medium (34-66%) pink granulation within the wound bed. There is a medium (34-66%) amount of necrotic tissue within the wound bed including Adherent Slough. Periwound temperature was noted as No Abnormality. The periwound has tenderness on palpation. Wound #2 status is Open. Original cause of wound was Gradually Appeared. The wound is located on the Right Malleolus. The wound measures 0.5cm length x 0.7cm width x 0.2cm depth; 0.275cm^2 area and 0.055cm^3 volume. There is no tunneling or undermining noted. There is a large amount of serous drainage noted. The wound margin is flat and intact. There is no granulation within the wound bed. There is a large (67-100%) amount of necrotic tissue within the wound bed including Adherent Slough. Periwound temperature was noted as No Abnormality. The periwound has tenderness on palpation. 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 There was a Skin/Subcutaneous Tissue Debridement ([id number]) debridement with total area of 0.35 sq cm performed by STONE III, HOYT E., PA-C. with the following instrument(s): Curette to remove Viable and Non-Viable tissue/material including Exudate, Fibrin/Slough, and Subcutaneous after achieving pain control using Lidocaine 4% Topical Solution. A time out was conducted at 13:50, prior to the start of the procedure. A Minimum amount of bleeding was controlled with Pressure. The procedure was tolerated well with a pain level of 0 throughout and a pain  level of 0 following the procedure. Post Debridement Measurements: 0.5cm length x 0.7cm width x 0.3cm depth; 0.082cm^3 volume. Character of Wound/Ulcer Post Debridement requires further debridement. Severity of Tissue Post Debridement is: Fat layer exposed. Post procedure Diagnosis Wound #2: Same as Pre-Procedure Plan Wound Cleansing: Wound #1 Left Malleolus: Cleanse wound with mild soap and water May Shower, gently pat wound dry prior to applying new dressing. May shower with protection. Wound #2 Right Malleolus: Cleanse wound with mild soap and water May Shower, gently pat wound dry prior to applying new dressing. May shower with protection. Anesthetic: Wound #1 Left Malleolus: Topical Lidocaine 4% cream applied to wound bed  prior to debridement Wound #2 Right Malleolus: Topical Lidocaine 4% cream applied to wound bed prior to debridement Skin Barriers/Peri-Wound Care: Wound #1 Left Malleolus: Skin Prep Wound #2 Right Malleolus: Skin Prep Primary Wound Dressing: Wound #1 Left Malleolus: Prisma Ag Wound #2 Right Malleolus: Santyl Ointment Secondary Dressing: Wound #1 Left Malleolus: Dry Gauze Boardered Foam Dressing Jessica Donovan, Jessica Donovan ([id number]) Wound #2 Right Malleolus: Dry Gauze Boardered Foam Dressing Dressing Change Frequency: Wound #1 Left Malleolus: Change dressing every other day. Wound #2 Right Malleolus: Change dressing every day. Follow-up Appointments: Wound #1 Left Malleolus: Return Appointment in 1 week. Wound #2 Right Malleolus: Return Appointment in 1 week. Edema Control: Wound #1 Left Malleolus: Elevate legs to the level of the heart and pump ankles as often as possible Wound #2 Right Malleolus: Elevate legs to the level of the heart and pump ankles as often as possible Additional Orders / Instructions: Wound #1 Left Malleolus: Increase protein intake. Activity as tolerated Wound #2 Right Malleolus: Increase protein intake. Activity as  tolerated General Notes: At this point I'm gonna recommend that we have the second toe on the right foot in order to help prevent this from rubbing on the top of her shoe. I'm also going to look into for her to see about potential Botox therapy to try to loosen up the contracture that is occurring. I will also check on if there any other positions that may be able to manage this type of situation. Otherwise we will see her for reevaluation of what things stand at that point. She has been worsening in the interim she will contact our office for additional recommendations otherwise please see above for specific wound care orders. Electronic Signature(s) Signed: 08/02/2017 8:34:49 AM By: Lenda KelpStone III, Hoyt PA-C Entered By: Lenda KelpStone III, Hoyt on 07/30/2017 14:52:53 Jessica Donovan, [name] ([id number]) -------------------------------------------------------------------------------- SuperBill Details Patient Name: Jessica DanceGLASS, Liel Date of Service: 07/30/2017 Medical Record Number: [id number] Patient Account Number: [id number] Date of Birth/Sex: Mar 07, 1946 (71 y.o. 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

## 2017-08-02 NOTE — Patient Outreach (Signed)
Successful telephone encounter to Jessica Donovan (on pt's consent, resident service coordinator), who called RN CM earlier.  Spoke with Jessica Donovan, HIPAA identifiers provided on pt, reports was trying to return RN CM's call.  Discussed  difficulty trying to reach pt to which Jessica Donovan reports pt lost her phone, has been borrowing a phone from another resident who was not using it.  Jessica Donovan report pt is suppose to be getting a new phone this week/same number  to which RN CM will call later this week.     Plan:  RN CM to follow up with pt telephonically later this week, check on status.   [name] M.   Jessica Lavis RN CCM [facility]  [phone]

## 2017-08-03 DIAGNOSIS — M546 Pain in thoracic spine: Secondary | ICD-10-CM | POA: Diagnosis not present

## 2017-08-04 ENCOUNTER — Telehealth: Payer: Self-pay

## 2017-08-04 NOTE — Telephone Encounter (Signed)
LVM for patient callback.   

## 2017-08-06 ENCOUNTER — Other Ambulatory Visit: Payer: Self-pay | Admitting: *Deleted

## 2017-08-06 NOTE — Patient Outreach (Signed)
Successful telephone encounter to [name], 71 year old female, check on clinical status as this RN CM followed pt for transition of care/recent hospitalization June 20-21,2018, program ended 07/12/17. Spoke with pt, HIPAA identifiers verified, discussed several attempt calls to reach her to which pt reports lost her phone, got a new one this week.   Pt reports toe/right foot is better/healed/scab off/have to make sure where shoes so don't rub it/keep a band aid on.   Pt reports outside of right ankle beginning to hurt more today,no redness/edema/no change to drainage (little) to follow up at wound care center 08/09/17.  RN CM discussed with pt calling Wound care center sooner if needed to which pt thinks it will be okay until then.   Pt reports following up with Spine and back specialist, to have MRI of upper back 08/10/17, have epidural in lower back 08/20/17.   Pt had to end call as friend visiting, help celebrate her birthday today.     Plan:  As discussed with pt, plan to follow up again next week, check on status.   If pt remains stable, RN CM to discharge.     Shayne Alken.   Karen Kinnard RN CCM [facility] Care Management  [phone]

## 2017-08-09 ENCOUNTER — Encounter: Payer: PPO | Admitting: Surgery

## 2017-08-09 DIAGNOSIS — E11621 Type 2 diabetes mellitus with foot ulcer: Secondary | ICD-10-CM | POA: Diagnosis not present

## 2017-08-09 DIAGNOSIS — L97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 left ankle with fat layer exposed: Secondary | ICD-10-CM | POA: Diagnosis not present

## 2017-08-09 DIAGNOSIS — L97319 Non-pressure chronic ulcer of right ankle with unspecified severity: Secondary | ICD-10-CM | POA: Diagnosis not present

## 2017-08-09 DIAGNOSIS — E11622 Type 2 diabetes mellitus with other skin ulcer: Secondary | ICD-10-CM | POA: Diagnosis not present

## 2017-08-10 DIAGNOSIS — M546 Pain in thoracic spine: Secondary | ICD-10-CM | POA: Diagnosis not present

## 2017-08-10 NOTE — Progress Notes (Signed)
Jessica, Donovan ([id number]) Visit Report for 08/09/2017 Arrival Information Details Patient Name: Jessica Donovan, [place] Date of Service: 08/09/2017 1:30 PM Medical Record Number: [id number] Patient Account Number: [id number] Date of Birth/Sex: [birth year] (71 y.o. Female) Treating RN: Ashok Cordia, Debi Primary Care Marshal Schrecengost: Garlon Hatchet Other Clinician: Referring Chrisotpher Rivero: Garlon Hatchet Treating Edvardo Honse/Extender: Rudene Re in Treatment: 4 Visit Information History Since Last Visit All ordered tests and consults were completed: No Patient Arrived: Jessica Donovan Added or deleted any medications: No Arrival Time: 13:12 Any new allergies or adverse reactions: No Accompanied By: self Had a fall or experienced change in No Transfer Assistance: None activities of daily living that may affect Patient Identification Verified: Yes risk of falls: Secondary Verification Process Yes Signs or symptoms of abuse/neglect since last No Completed: visito Patient Requires Transmission- No Hospitalized since last visit: No Based Precautions: Has Dressing in Place as Prescribed: Yes Patient Has Alerts: Yes Pain Present Now: Yes Patient Alerts: Patient on Blood Thinner ASA Electronic Signature(s) Signed: 08/09/2017 4:56:27 PM By: Alejandro Mulling Entered By: Alejandro Mulling on 08/09/2017 13:13:08 Mccullar, Jessica Mccreedy ([id number]) -------------------------------------------------------------------------------- Clinic Level of Care Assessment Details Patient Name: Jessica Donovan Date of Service: 08/09/2017 1:30 PM Medical Record Number: [id number] Patient Account Number: [id number] Date of Birth/Sex: [birth year] (71 y.o. Female) Treating RN: Ashok Cordia, Debi Primary Care Tywaun Hiltner: Garlon Hatchet Other Clinician: Referring Zayanna Pundt: Garlon Hatchet Treating Olia Hinderliter/Extender: Rudene Re in Treatment: 4 Clinic Level of Care Assessment Items TOOL 4 Quantity Score X - Use when only an  EandM is performed on FOLLOW-UP visit 1 0 ASSESSMENTS - Nursing Assessment / Reassessment X - Reassessment of Co-morbidities (includes updates in patient status) 1 10 X - Reassessment of Adherence to Treatment Plan 1 5 ASSESSMENTS - Wound and Skin Assessment / Reassessment []  - Simple Wound Assessment / Reassessment - one wound 0 X - Complex Wound Assessment / Reassessment - multiple wounds 2 5 []  - Dermatologic / Skin Assessment (not related to wound area) 0 ASSESSMENTS - Focused Assessment []  - Circumferential Edema Measurements - multi extremities 0 []  - Nutritional Assessment / Counseling / Intervention 0 []  - Lower Extremity Assessment (monofilament, tuning fork, pulses) 0 []  - Peripheral Arterial Disease Assessment (using hand held doppler) 0 ASSESSMENTS - Ostomy and/or Continence Assessment and Care []  - Incontinence Assessment and Management 0 []  - Ostomy Care Assessment and Management (repouching, etc.) 0 PROCESS - Coordination of Care X - Simple Patient / Family Education for ongoing care 1 15 []  - Complex (extensive) Patient / Family Education for ongoing care 0 []  - Staff obtains Chiropractor, Records, Test Results / Process Orders 0 []  - Staff telephones HHA, Nursing Homes / Clarify orders / etc 0 []  - Routine Transfer to another Facility (non-emergent condition) 0 Donovan, Jessica ([id number]) []  - Routine Hospital Admission (non-emergent condition) 0 []  - New Admissions / Manufacturing engineer / Ordering NPWT, Apligraf, etc. 0 []  - Emergency Hospital Admission (emergent condition) 0 X - Simple Discharge Coordination 1 10 []  - Complex (extensive) Discharge Coordination 0 PROCESS - Special Needs []  - Pediatric / Minor Patient Management 0 []  - Isolation Patient Management 0 []  - Hearing / Language / Visual special needs 0 []  - Assessment of Community assistance (transportation, D/C planning, etc.) 0 []  - Additional assistance / Altered mentation 0 []  - Support Surface(s)  Assessment (bed, cushion, seat, etc.) 0 INTERVENTIONS - Wound Cleansing / Measurement []  - Simple Wound Cleansing - one wound 0 X - Complex Wound Cleansing - multiple wounds 2 5 X - Wound  Imaging (photographs - any number of wounds) 1 5 []  - Wound Tracing (instead of photographs) 0 []  - Simple Wound Measurement - one wound 0 X - Complex Wound Measurement - multiple wounds 2 5 INTERVENTIONS - Wound Dressings X - Small Wound Dressing one or multiple wounds 2 10 []  - Medium Wound Dressing one or multiple wounds 0 []  - Large Wound Dressing one or multiple wounds 0 X - Application of Medications - topical 1 5 []  - Application of Medications - injection 0 INTERVENTIONS - Miscellaneous []  - External ear exam 0 Donovan, Jessica ([id number]) []  - Specimen Collection (cultures, biopsies, blood, body fluids, etc.) 0 []  - Specimen(s) / Culture(s) sent or taken to Lab for analysis 0 []  - Patient Transfer (multiple staff / Michiel Sites Lift / Similar devices) 0 []  - Simple Staple / Suture removal (25 or less) 0 []  - Complex Staple / Suture removal (26 or more) 0 []  - Hypo / Hyperglycemic Management (close monitor of Blood Glucose) 0 []  - Ankle / Brachial Index (ABI) - do not check if billed separately 0 X - Vital Signs 1 5 Has the patient been seen at the hospital within the last three years: Yes Total Score: 105 Level Of Care: New/Established - Level 3 Electronic Signature(s) Signed: 08/09/2017 4:56:27 PM By: Alejandro Mulling Entered By: Alejandro Mulling on 08/09/2017 13:53:07 Donovan, Jessica Mccreedy ([id number]) -------------------------------------------------------------------------------- Encounter Discharge Information Details Patient Name: Jessica Donovan Date of Service: 08/09/2017 1:30 PM Medical Record Number: [id number] Patient Account Number: [id number] Date of Birth/Sex: Aug 01, 1946 (71 y.o. Female) Treating RN: Ashok Cordia, Debi Primary Care Cuyler Vandyken: Garlon Hatchet Other Clinician: Referring  Jalexia Lalli: Garlon Hatchet Treating Jhalen Eley/Extender: Rudene Re in Treatment: 4 Encounter Discharge Information Items Discharge Pain Level: 3 Discharge Condition: Stable Ambulatory Status: Walker Discharge Destination: Home Transportation: Private Auto Accompanied By: self Schedule Follow-up Appointment: Yes Medication Reconciliation completed No and provided to Patient/Care Amabel Stmarie: Provided on Clinical Summary of Care: 08/09/2017 Form Type Recipient Paper Patient BG Electronic Signature(s) Signed: 08/09/2017 4:56:27 PM By: Alejandro Mulling Entered By: Alejandro Mulling on 08/09/2017 13:47:38 Vigna, Jessica Mccreedy ([id number]) -------------------------------------------------------------------------------- Lower Extremity Assessment Details Patient Name: Jessica Donovan Date of Service: 08/09/2017 1:30 PM Medical Record Number: [id number] Patient Account Number: [id number] Date of Birth/Sex: 03-12-1946 (71 y.o. Female) Treating RN: Ashok Cordia, Debi Primary Care Knoah Nedeau: Garlon Hatchet Other Clinician: Referring Rayshard Schirtzinger: Garlon Hatchet Treating Hershey Knauer/Extender: Rudene Re in Treatment: 4 Vascular Assessment Pulses: Dorsalis Pedis Palpable: [Left:Yes] [Right:Yes] Posterior Tibial Extremity colors, hair growth, and conditions: Extremity Color: [Left:Normal] [Right:Normal] Temperature of Extremity: [Left:Warm] [Right:Warm] Capillary Refill: [Left:< 3 seconds] [Right:< 3 seconds] Electronic Signature(s) Signed: 08/09/2017 4:56:27 PM By: Alejandro Mulling Entered By: Alejandro Mulling on 08/09/2017 13:21:26 Blaze, Jessica Mccreedy ([id number]) -------------------------------------------------------------------------------- Multi Wound Chart Details Patient Name: Jessica Donovan Date of Service: 08/09/2017 1:30 PM Medical Record Number: [id number] Patient Account Number: [id number] Date of Birth/Sex: 12/28/1945 (71 y.o. Female) Treating RN: Ashok Cordia, Debi Primary  Care Kayte Borchard: Garlon Hatchet Other Clinician: Referring Jonhatan Hearty: Garlon Hatchet Treating Raesha Coonrod/Extender: Rudene Re in Treatment: 4 Vital Signs Height(in): 66 Pulse(bpm): 79 Weight(lbs): 191 Blood Pressure 160/71 (mmHg): Body Mass Index(BMI): 31 Temperature(F): 98.3 Respiratory Rate 18 (breaths/min): Photos: [1:No Photos] [2:No Photos] [N/A:N/A] Wound Location: [1:Left Malleolus] [2:Right Malleolus] [N/A:N/A] Wounding Event: [1:Gradually Appeared] [2:Gradually Appeared] [N/A:N/A] Primary Etiology: [1:Diabetic Wound/Ulcer of the Lower Extremity] [2:Diabetic Wound/Ulcer of the Lower Extremity] [N/A:N/A] Comorbid History: [1:Sleep Apnea, Coronary Artery Disease, Hypertension, Myocardial Infarction, Type II Diabetes, History of pressure wounds, Osteoarthritis, Neuropathy, Confinement Anxiety] [2:Sleep Apnea, Coronary Artery Disease, Hypertension,  Myocardial Infarction, Type II Diabetes, History of pressure wounds, Osteoarthritis, Neuropathy, Confinement Anxiety] [N/A:N/A] Date Acquired: [1:02/17/2017] [2:02/17/2017] [N/A:N/A] Weeks of Treatment: [1:4] [2:4] [N/A:N/A] Wound Status: [1:Open] [2:Open] [N/A:N/A] Measurements L x W x D 0.4x0.3x0.1 [2:0.8x0.8x0.2] [N/A:N/A] (cm) Area (cm) : [1:0.094] [2:0.503] [N/A:N/A] Volume (cm) : [1:0.009] [2:0.101] [N/A:N/A] % Reduction in Area: [1:73.40%] [2:58.90%] [N/A:N/A] % Reduction in Volume: 74.30% [2:17.90%] [N/A:N/A] Classification: [1:Grade 1] [2:Grade 1] [N/A:N/A] Exudate Amount: [1:Large] [2:Large] [N/A:N/A] Exudate Type: [1:Serosanguineous] [2:Serous] [N/A:N/A] Exudate Color: [1:red, brown] [2:amber] [N/A:N/A] Wound Margin: [1:Flat and Intact] [2:Flat and Intact] [N/A:N/A] Granulation Amount: [1:Medium (34-66%)] [2:None Present (0%)] [N/A:N/A] Granulation Quality: [1:Pink] [2:N/A] [N/A:N/A] Necrotic Amount: Medium (34-66%) Large (67-100%) N/A Epithelialization: None None N/A Periwound Skin Texture: No  Abnormalities Noted No Abnormalities Noted N/A Periwound Skin No Abnormalities Noted No Abnormalities Noted N/A Moisture: Periwound Skin Color: No Abnormalities Noted Erythema: Yes N/A Rubor: Yes Erythema Location: N/A Circumferential N/A Temperature: No Abnormality No Abnormality N/A Tenderness on Yes Yes N/A Palpation: Wound Preparation: Ulcer Cleansing: Ulcer Cleansing: N/A Rinsed/Irrigated with Rinsed/Irrigated with Saline Saline Topical Anesthetic Topical Anesthetic Applied: Other: lidocaine Applied: Other: lidocaine 4% 4% Treatment Notes Wound #1 (Left Malleolus) 1. Cleansed with: Clean wound with Normal Saline 2. Anesthetic Topical Lidocaine 4% cream to wound bed prior to debridement 5. Secondary Dressing Applied Bordered Foam Dressing Dry Gauze Wound #2 (Right Malleolus) 1. Cleansed with: Clean wound with Normal Saline 2. Anesthetic Topical Lidocaine 4% cream to wound bed prior to debridement 3. Peri-wound Care: Antifungal cream 4. Dressing Applied: Santyl Ointment 5. Secondary Dressing Applied Bordered Foam Dressing Dry Gauze Electronic Signature(s) Signed: 08/09/2017 3:16:27 PM By: Evlyn Kanner MD, FACS Entered By: Evlyn Kanner on 08/09/2017 13:47:24 LESETTE, BUSEY ([id number]) DOMITILA, SAILER ([id number]) -------------------------------------------------------------------------------- Multi-Disciplinary Care Plan Details Patient Name: Jessica Donovan Date of Service: 08/09/2017 1:30 PM Medical Record Number: [id number] Patient Account Number: [id number] Date of Birth/Sex: 01-06-1946 (71 y.o. Female) Treating RN: Ashok Cordia, Debi Primary Care Seleena Reimers: Garlon Hatchet Other Clinician: Referring Jakell Trusty: Garlon Hatchet Treating Calianne Larue/Extender: Rudene Re in Treatment: 4 Active Inactive ` Orientation to the Wound Care Program Nursing Diagnoses: Knowledge deficit related to the wound healing center program Goals: Patient/caregiver will  verbalize understanding of the Wound Healing Center Program Date Initiated: 07/08/2017 Target Resolution Date: 10/08/2017 Goal Status: Active Interventions: Provide education on orientation to the wound center Notes: ` Peripheral Neuropathy Nursing Diagnoses: Knowledge deficit related to disease process and management of peripheral neurovascular dysfunction Potential alteration in peripheral tissue perfusion (select prior to confirmation of diagnosis) Goals: Patient/caregiver will verbalize understanding of disease process and disease management Date Initiated: 07/08/2017 Target Resolution Date: 10/08/2017 Goal Status: Active Interventions: Assess signs and symptoms of neuropathy upon admission and as needed Provide education on Management of Neuropathy and Related Ulcers Provide education on Management of Neuropathy upon discharge from the Wound Center Treatment Activities: Patient referred for customized footwear/offloading : 07/08/2017 Patient referred to diabetes educator : 07/08/2017 Jessica Donovan ([id number]) Notes: ` Wound/Skin Impairment Nursing Diagnoses: Impaired tissue integrity Knowledge deficit related to ulceration/compromised skin integrity Goals: Patient/caregiver will verbalize understanding of skin care regimen Date Initiated: 07/08/2017 Target Resolution Date: 10/08/2017 Goal Status: Active Ulcer/skin breakdown will have a volume reduction of 30% by week 4 Date Initiated: 07/08/2017 Target Resolution Date: 10/08/2017 Goal Status: Active Ulcer/skin breakdown will have a volume reduction of 50% by week 8 Date Initiated: 07/08/2017 Target Resolution Date: 10/08/2017 Goal Status: Active Ulcer/skin breakdown will have a volume reduction of 80% by week 12 Date Initiated: 07/08/2017 Target Resolution  Date: 10/08/2017 Goal Status: Active Ulcer/skin breakdown will heal within 14 weeks Date Initiated: 07/08/2017 Target Resolution Date: 10/08/2017 Goal Status:  Active Interventions: Assess patient/caregiver ability to obtain necessary supplies Assess patient/caregiver ability to perform ulcer/skin care regimen upon admission and as needed Assess ulceration(s) every visit Treatment Activities: Referred to DME Anya Murphey for dressing supplies : 07/08/2017 Skin care regimen initiated : 07/08/2017 Topical wound management initiated : 07/08/2017 Notes: Electronic Signature(s) Signed: 08/09/2017 4:56:27 PM By: Alejandro Mulling Entered By: Alejandro Mulling on 08/09/2017 13:21:54 Cassar, Jessica Mccreedy ([id number]) -------------------------------------------------------------------------------- Pain Assessment Details Patient Name: Jessica Donovan Date of Service: 08/09/2017 1:30 PM Medical Record Number: [id number] Patient Account Number: [id number] Date of Birth/Sex: Jul 15, 1946 (71 y.o. Female) Treating RN: Ashok Cordia, Debi Primary Care Docie Abramovich: Garlon Hatchet Other Clinician: Referring Breeana Sawtelle: Garlon Hatchet Treating Makell Cyr/Extender: Rudene Re in Treatment: 4 Active Problems Location of Pain Severity and Description of Pain Patient Has Paino Yes Site Locations Pain Location: Pain in Ulcers Rate the pain. Current Pain Level: 4 Character of Pain Describe the Pain: Throbbing Pain Management and Medication Current Pain Management: Electronic Signature(s) Signed: 08/09/2017 4:56:27 PM By: Alejandro Mulling Entered By: Alejandro Mulling on 08/09/2017 13:13:27 Jessica Donovan ([id number]) -------------------------------------------------------------------------------- Patient/Caregiver Education Details Patient Name: Jessica Donovan Date of Service: 08/09/2017 1:30 PM Medical Record Number: [id number] Patient Account Number: [id number] Date of Birth/Gender: [birth year] (71 y.o. Female) Treating RN: Phillis Haggis Primary Care Physician: Garlon Hatchet Other Clinician: Referring Physician: Garlon Hatchet Treating Physician/Extender:  Rudene Re in Treatment: 4 Education Assessment Education Provided To: Patient Education Topics Provided Wound/Skin Impairment: Handouts: Other: change dressing as ordered Methods: Demonstration, Explain/Verbal Responses: State content correctly Electronic Signature(s) Signed: 08/09/2017 4:56:27 PM By: Alejandro Mulling Entered By: Alejandro Mulling on 08/09/2017 13:47:52 Monnier, Jessica Mccreedy ([id number]) -------------------------------------------------------------------------------- Wound Assessment Details Patient Name: Jessica Donovan Date of Service: 08/09/2017 1:30 PM Medical Record Number: [id number] Patient Account Number: [id number] Date of Birth/Sex: 01/11/1946 (71 y.o. Female) Treating RN: Ashok Cordia, Debi Primary Care Brynden Thune: Garlon Hatchet Other Clinician: Referring Argelio Granier: Garlon Hatchet Treating Buckley Bradly/Extender: Rudene Re in Treatment: 4 Wound Status Wound Number: 1 Primary Diabetic Wound/Ulcer of the Lower Etiology: Extremity Wound Location: Left Malleolus Wound Open Wounding Event: Gradually Appeared Status: Date Acquired: 02/17/2017 Comorbid Sleep Apnea, Coronary Artery Disease, Weeks Of Treatment: 4 History: Hypertension, Myocardial Infarction, Clustered Wound: No Type II Diabetes, History of pressure wounds, Osteoarthritis, Neuropathy, Confinement Anxiety Photos Photo Uploaded By: Alejandro Mulling on 08/09/2017 15:49:46 Wound Measurements Length: (cm) 0.4 Width: (cm) 0.3 Depth: (cm) 0.1 Area: (cm) 0.094 Volume: (cm) 0.009 % Reduction in Area: 73.4% % Reduction in Volume: 74.3% Epithelialization: None Tunneling: No Undermining: No Wound Description Classification: Grade 1 Wound Margin: Flat and Intact Exudate Amount: Large Exudate Type: Serosanguineous Exudate Color: red, brown Foul Odor After Cleansing: No Slough/Fibrino Yes Wound Bed Granulation Amount: Medium (34-66%) Granulation Quality: Pink Cesaro, Dashonda  ([id number]) Necrotic Amount: Medium (34-66%) Necrotic Quality: Adherent Slough Periwound Skin Texture Texture Color No Abnormalities Noted: No No Abnormalities Noted: No Moisture Temperature / Pain No Abnormalities Noted: No Temperature: No Abnormality Tenderness on Palpation: Yes Wound Preparation Ulcer Cleansing: Rinsed/Irrigated with Saline Topical Anesthetic Applied: Other: lidocaine 4%, Treatment Notes Wound #1 (Left Malleolus) 1. Cleansed with: Clean wound with Normal Saline 2. Anesthetic Topical Lidocaine 4% cream to wound bed prior to debridement 5. Secondary Dressing Applied Bordered Foam Dressing Dry Gauze Electronic Signature(s) Signed: 08/09/2017 4:56:27 PM By: Alejandro Mulling Entered By: Alejandro Mulling on 08/09/2017 13:20:28 Heminger, Jessica Mccreedy ([id number]) -------------------------------------------------------------------------------- Wound Assessment Details Patient Name: Jessica Donovan  Date of Service: 08/09/2017 1:30 PM Medical Record Number: [id number] Patient Account Number: [id number] Date of Birth/Sex: 06/23/1946 (71 y.o. Female) Treating RN: Ashok Cordia, Debi Primary Care Taijon Vink: Garlon Hatchet Other Clinician: Referring Keion Neels: Garlon Hatchet Treating Jaquis Picklesimer/Extender: Rudene Re in Treatment: 4 Wound Status Wound Number: 2 Primary Diabetic Wound/Ulcer of the Lower Etiology: Extremity Wound Location: Right Malleolus Wound Open Wounding Event: Gradually Appeared Status: Date Acquired: 02/17/2017 Comorbid Sleep Apnea, Coronary Artery Disease, Weeks Of Treatment: 4 History: Hypertension, Myocardial Infarction, Clustered Wound: No Type II Diabetes, History of pressure wounds, Osteoarthritis, Neuropathy, Confinement Anxiety Photos Photo Uploaded By: Alejandro Mulling on 08/09/2017 15:49:46 Wound Measurements Length: (cm) 0.8 Width: (cm) 0.8 Depth: (cm) 0.2 Area: (cm) 0.503 Volume: (cm) 0.101 % Reduction in Area: 58.9% %  Reduction in Volume: 17.9% Epithelialization: None Tunneling: No Undermining: No Wound Description Classification: Grade 1 Wound Margin: Flat and Intact Exudate Amount: Large Exudate Type: Serous Exudate Color: amber Foul Odor After Cleansing: No Slough/Fibrino Yes Wound Bed Granulation Amount: None Present (0%) Necrotic Amount: Large (67-100%) Pamintuan, Kandra ([id number]) Necrotic Quality: Adherent Slough Periwound Skin Texture Texture Color No Abnormalities Noted: No No Abnormalities Noted: No Erythema: Yes Moisture Erythema Location: Circumferential No Abnormalities Noted: No Rubor: Yes Temperature / Pain Temperature: No Abnormality Tenderness on Palpation: Yes Wound Preparation Ulcer Cleansing: Rinsed/Irrigated with Saline Topical Anesthetic Applied: Other: lidocaine 4%, Treatment Notes Wound #2 (Right Malleolus) 1. Cleansed with: Clean wound with Normal Saline 2. Anesthetic Topical Lidocaine 4% cream to wound bed prior to debridement 3. Peri-wound Care: Antifungal cream 4. Dressing Applied: Santyl Ointment 5. Secondary Dressing Applied Bordered Foam Dressing Dry Gauze Electronic Signature(s) Signed: 08/09/2017 4:56:27 PM By: Alejandro Mulling Entered By: Alejandro Mulling on 08/09/2017 13:20:45 Paynter, Jessica Mccreedy ([id number]) -------------------------------------------------------------------------------- Vitals Details Patient Name: Jessica Donovan Date of Service: 08/09/2017 1:30 PM Medical Record Number: [id number] Patient Account Number: [id number] Date of Birth/Sex: 1946-04-06 (71 y.o. Female) Treating RN: Ashok Cordia, Debi Primary Care Roberto Romanoski: Garlon Hatchet Other Clinician: Referring Janna Oak: Garlon Hatchet Treating Vikrant Pryce/Extender: Rudene Re in Treatment: 4 Vital Signs Time Taken: 13:14 Temperature (F): 98.3 Height (in): 66 Pulse (bpm): 79 Weight (lbs): 191 Respiratory Rate (breaths/min): 18 Body Mass Index (BMI): 30.8 Blood  Pressure (mmHg): 160/71 Reference Range: 80 - 120 mg / dl Electronic Signature(s) Signed: 08/09/2017 4:56:27 PM By: Alejandro Mulling Entered By: Alejandro Mulling on 08/09/2017 13:15:13

## 2017-08-10 NOTE — Progress Notes (Addendum)
BOBBE, QUILTER ([id number]) Visit Report for 08/09/2017 Chief Complaint Document Details Patient Name: Jessica Donovan, Jessica Donovan Date of Service: 08/09/2017 1:30 PM Medical Record Number: [id number] Patient Account Number: [id number] Date of Birth/Sex: December 29, 1945 (71 y.o. Female) Treating RN: Phillis Haggis Primary Care Provider: Garlon Hatchet Other Clinician: Referring Provider: Garlon Hatchet Treating Provider/Extender: Rudene Re in Treatment: 4 Information Obtained from: Patient Chief Complaint Patients presents for treatment of an open diabetic ulcer to the right ankle the left ankle and the right third toe Electronic Signature(s) Signed: 08/09/2017 3:16:27 PM By: Evlyn Kanner MD, FACS Entered By: Evlyn Kanner on 08/09/2017 13:47:33 Groom, [name] ([id number]) -------------------------------------------------------------------------------- HPI Details Patient Name: Jessica Donovan Date of Service: 08/09/2017 1:30 PM Medical Record Number: [id number] Patient Account Number: [id number] Date of Birth/Sex: 1946/01/09 (71 y.o. Female) Treating RN: Phillis Haggis Primary Care Provider: Garlon Hatchet Other Clinician: Referring Provider: Garlon Hatchet Treating Provider/Extender: Rudene Re in Treatment: 4 History of Present Illness HPI Description: 71 year old diabetic patient who has chronic back pain, collagen vascular disease, coronary artery disease, hypertension and history of osteomyelitis of the toe in February 2017. 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 She also has had joint replacement surgery in the past. She was recently reviewed by Primary care management team and had a problem with the toe on her right foot and her left ankle had minimal drainage, and the right ankle was draining worse. She was seen in the ER on 06/22/2017, and on the x-ray, there was no evidence of any acute bony abnormality. 07/22/2017 -- she  says she had a mild abrasion to her right second toe which was inadvertent but there is no open ulceration. 07/30/17 on evaluation today patient appears to be doing better in regard to her abrasion to the right second toe. It appears there's little bit of a pressure injury although there is no opening at this point. I think this is pushing up on her shoe which is causing the issue at this point and that's due to the contraction that happening with her toe. With that being said patient wonders if there's anything that can be done in that regard for this. She has been tolerating the dressing changes without any complication and is having really no pain. Her right lateral heel wound appears to be slough covered though there is no evidence of infection. No fevers, chills, nausea, or vomiting noted at this time. 08/09/2017 -- since last week her leg has gotten quite red and painful and she was concerned about the possibility of infection Electronic Signature(s) Signed: 08/09/2017 3:16:27 PM By: Evlyn Kanner MD, FACS Entered By: Evlyn Kanner on 08/09/2017 13:47:59 Jessica Donovan ([id number]) -------------------------------------------------------------------------------- Physical Exam Details Patient Name: Jessica Donovan Date of Service: 08/09/2017 1:30 PM Medical Record Number: [id number] Patient Account Number: [id number] Date of Birth/Sex: 06/06/1946 (71 y.o. Female) Treating RN: Phillis Haggis Primary Care Provider: Garlon Hatchet Other Clinician: Referring Provider: Garlon Hatchet Treating Provider/Extender: Rudene Re in Treatment: 4 Constitutional . Pulse regular. Respirations normal and unlabored. Afebrile. . Eyes Nonicteric. Reactive to light. Ears, Nose, Mouth, and Throat Lips, teeth, and gums WNL.[name] Moist mucosa without lesions. Neck supple and nontender. No palpable supraclavicular or cervical adenopathy. Normal sized without goiter. Respiratory WNL. No  retractions.. Cardiovascular Pedal Pulses WNL. No clubbing, cyanosis or edema. Lymphatic No adneopathy. No adenopathy. No adenopathy. Musculoskeletal Adexa without tenderness or enlargement.. Digits and nails w/o clubbing, cyanosis, infection, petechiae, ischemia, or inflammatory conditions.. Integumentary (Hair, Skin) No suspicious  lesions. No crepitus or fluctuance. No peri-wound warmth or erythema. No masses.[name] Psychiatric Judgement and insight Intact.. No evidence of depression, anxiety, or agitation.. Notes the patient's left ankle is almost completely healed and minimal eschar was removed with moist saline gauze and the base of the wound is clean The right ankle had significant amount of cellulitis around the wound and there is some slough which is fibrotic and is too tender for sharp debridement Electronic Signature(s) Signed: 08/09/2017 3:16:27 PM By: Evlyn Kanner MD, FACS Entered By: Evlyn Kanner on 08/09/2017 13:48:38 Jessica Donovan ([id number]) -------------------------------------------------------------------------------- Physician Orders Details Patient Name: Jessica Donovan Date of Service: 08/09/2017 1:30 PM Medical Record Number: [id number] Patient Account Number: [id number] Date of Birth/Sex: 21-Jul-1946 (71 y.o. Female) Treating RN: Ashok Cordia, Debi Primary Care Provider: Garlon Hatchet Other Clinician: Referring Provider: Garlon Hatchet Treating Provider/Extender: Rudene Re in Treatment: 4 Verbal / Phone Orders: Yes Clinician: Ashok Cordia, Debi Read Back and Verified: Yes Diagnosis Coding Wound Cleansing Wound #1 Left Malleolus o Cleanse wound with mild soap and water o May Shower, gently pat wound dry prior to applying new dressing. o May shower with protection. Wound #2 Right Malleolus o Cleanse wound with mild soap and water o May Shower, gently pat wound dry prior to applying new dressing. o May shower with  protection. Anesthetic Wound #1 Left Malleolus o Topical Lidocaine 4% cream applied to wound bed prior to debridement Wound #2 Right Malleolus o Topical Lidocaine 4% cream applied to wound bed prior to debridement Skin Barriers/Peri-Wound Care Wound #1 Left Malleolus o Skin Prep Wound #2 Right Malleolus o Skin Prep o Antifungal cream Primary Wound Dressing Wound #2 Right Malleolus o Santyl Ointment Secondary Dressing Wound #1 Left Malleolus o Dry Gauze o Boardered Foam Dressing Wound #2 Right Malleolus [place], Leauna ([id number]) o Dry Gauze o Boardered Foam Dressing Dressing Change Frequency Wound #1 Left Malleolus o Change dressing every other day. Wound #2 Right Malleolus o Change dressing every day. Follow-up Appointments Wound #1 Left Malleolus o Return Appointment in 1 week. Wound #2 Right Malleolus o Return Appointment in 1 week. Edema Control Wound #1 Left Malleolus o Elevate legs to the level of the heart and pump ankles as often as possible Wound #2 Right Malleolus o Elevate legs to the level of the heart and pump ankles as often as possible Additional Orders / Instructions Wound #1 Left Malleolus o Increase protein intake. o Activity as tolerated Wound #2 Right Malleolus o Increase protein intake. o Activity as tolerated Patient Medications Allergies: codeine, methadone, influenza vaccines, percoset, tetanus toxoids, tetracyclines Notifications Medication Indication Start End Bactrim DS 08/09/2017 DOSE oral 800 mg-160 mg tablet - tablet oral bid Electronic Signature(s) Signed: 08/09/2017 1:45:08 PM By: Evlyn Kanner MD, FACS Entered By: Evlyn Kanner on 08/09/2017 13:45:07 Mcerlean, [name] ([id number]) Jessica Donovan ([id number]) -------------------------------------------------------------------------------- Problem List Details Patient Name: Jessica Donovan Date of Service: 08/09/2017 1:30 PM Medical Record Number:  [id number] Patient Account Number: [id number] Date of Birth/Sex: [birth year] (71 y.o. 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 Female) Treating RN: Ashok Cordia, Debi Primary Care Provider: Garlon Hatchet Other Clinician: Referring Provider: Garlon Hatchet Treating Provider/Extender: Rudene Re in Treatment: 4 Subjective Chief Complaint Information obtained from Patient Patients presents for treatment of an open diabetic ulcer to the right ankle the left ankle and the right third toe History of Present Illness (HPI) 71 year old diabetic patient who has chronic back pain, collagen vascular disease, coronary artery disease, hypertension and history of osteomyelitis of the toe in February 2017. 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 She also has had joint replacement surgery in the past. She was recently reviewed by Primary care management team and had a problem  with the toe on her right foot and her left ankle had minimal drainage, and the right ankle was draining worse. She was seen in the ER on 06/22/2017, and on the x-ray, there was no evidence of any acute bony abnormality. 07/22/2017 -- she says she had a mild abrasion to her right second toe which was inadvertent but there is no open ulceration. 07/30/17 on evaluation today patient appears to be doing better in regard to her abrasion to the right second toe. It appears there's little bit of a pressure injury although there is no opening at this point. I think this is pushing up on her shoe which is causing the issue at this point and that's due to the contraction that happening with her toe. With that being said patient wonders if there's anything that can be done in that regard for this. She has been tolerating the dressing changes without any complication and is having really no pain. Her right lateral heel wound appears to be slough covered though there is no evidence of infection. No fevers, chills, nausea, or vomiting noted at this time. 08/09/2017 -- since last week her leg has gotten quite red and painful and she was concerned about the possibility of infection Objective Constitutional Westerhoff, Kamariya ([id number]) Pulse regular. Respirations normal and unlabored. Afebrile. Vitals Time Taken: 1:14 PM, Height: 66 in, Weight: 191 lbs, BMI: 30.8, Temperature: 98.3 F, Pulse: 79 bpm, Respiratory Rate: 18 breaths/min, Blood Pressure: 160/71 mmHg. Eyes Nonicteric. Reactive to light. Ears, Nose, Mouth, and Throat Lips, teeth, and gums WNL.[name] Moist mucosa without lesions. Neck supple and nontender. No palpable supraclavicular or cervical adenopathy. Normal sized without goiter. Respiratory WNL. No retractions.. Cardiovascular Pedal Pulses WNL. No clubbing, cyanosis or edema. Lymphatic No adneopathy. No adenopathy. No adenopathy. Musculoskeletal Adexa without tenderness or enlargement..  Digits and nails w/o clubbing, cyanosis, infection, petechiae, ischemia, or inflammatory conditions.[name] Psychiatric Judgement and insight Intact.. No evidence of depression, anxiety, or agitation.. General Notes: the patient's left ankle is almost completely healed and minimal eschar was removed with moist saline gauze and the base of the wound is clean The right ankle had significant amount of cellulitis around the wound and there is some slough which is fibrotic and is too tender for sharp debridement Integumentary (Hair, Skin) No suspicious lesions. No crepitus or fluctuance. No peri-wound warmth or erythema. No masses.. Wound #1 status is Open. Original cause of wound was Gradually Appeared. The wound is located on the Left Malleolus. The wound measures 0.4cm length x 0.3cm width x 0.1cm depth; 0.094cm^2 area  and 0.009cm^3 volume. There is no tunneling or undermining noted. There is a large amount of serosanguineous drainage noted. The wound margin is flat and intact. There is medium (34-66%) pink granulation within the wound bed. There is a medium (34-66%) amount of necrotic tissue within the wound bed including Adherent Slough. Periwound temperature was noted as No Abnormality. The periwound has tenderness on palpation. Wound #2 status is Open. Original cause of wound was Gradually Appeared. The wound is located on the Right Malleolus. The wound measures 0.8cm length x 0.8cm width x 0.2cm depth; 0.503cm^2 area and 0.101cm^3 volume. There is no tunneling or undermining noted. There is a large amount of serous drainage noted. The wound margin is flat and intact. There is no granulation within the wound bed. There is a large Lauf, Zaynab ([id number]) (67-100%) amount of necrotic tissue within the wound bed including Adherent Slough. The periwound skin appearance exhibited: Rubor, Erythema. The surrounding wound skin color is noted with erythema which is circumferential. Periwound temperature  was noted as No Abnormality. The periwound has tenderness on palpation. 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 May shower with protection. Wound #2 Right Malleolus: Cleanse wound with mild soap and water May Shower, gently pat wound dry prior to applying new dressing. May shower with protection. Anesthetic: Wound #1 Left Malleolus: Topical Lidocaine 4% cream applied to wound bed prior to debridement Wound #2 Right Malleolus: Topical Lidocaine 4% cream applied to wound bed prior to debridement Skin Barriers/Peri-Wound Care: Wound #1 Left Malleolus: Skin Prep Wound #2 Right Malleolus: Skin Prep Antifungal cream Primary Wound Dressing: Wound #2 Right Malleolus: Santyl Ointment Secondary Dressing: Wound #1 Left Malleolus: Rugg, [place] ([id number]) Dry Gauze Boardered Foam Dressing Wound #2 Right Malleolus: Dry Gauze Boardered Foam Dressing Dressing Change Frequency: Wound #1 Left Malleolus: Change dressing every other day. Wound #2 Right Malleolus: Change dressing every day. Follow-up Appointments: Wound #1 Left Malleolus: Return Appointment in 1 week. Wound #2 Right Malleolus: Return Appointment in 1 week. Edema Control: Wound #1 Left Malleolus: Elevate legs to the level of the heart and pump ankles as often as possible Wound #2 Right Malleolus: Elevate legs to the level of the heart and pump ankles as often as possible Additional Orders / Instructions: Wound #1 Left Malleolus: Increase protein intake. Activity as tolerated Wound #2 Right Malleolus: Increase protein  intake. Activity as tolerated The following medication(s) was prescribed: Bactrim DS oral 800 mg-160 mg tablet tablet oral bid starting 08/09/2017 After review of recommended: 1. Santyl ointment for the right lateral leg which she has already been using. 2. a bordered foam to her left lateral ankle. 3. Bactrim DS to be started today. I have told her to watch her blood pressure medications and call her PCP if required 4. Good control of her diabetes mellitus 5. Adequate offloading of both these areas Electronic Signature(s) Signed: 08/09/2017 3:16:27 PM By: Evlyn Kanner MD, FACS Entered By: Evlyn Kanner on 08/09/2017 13:49:44 Devivo, Adysen ([id number]) MARLET, KORTE ([id number]) -------------------------------------------------------------------------------- SuperBill Details Patient Name: Jessica Donovan Date of Service: 08/09/2017 Medical Record Number: [id number] Patient Account Number: [id number] Date of Birth/Sex: 1946-07-10 (71 y.o. 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 L03.115 Cellulitis of right lower limb Facility Procedures CPT4 Code: 16109604 Description: 99213 - WOUND CARE VISIT-LEV 3 EST PT Modifier: Quantity: 1 Physician Procedures CPT4 Code Description: [id number] 99213 - WC PHYS LEVEL 3 - EST PT ICD-10 Description Diagnosis E11.621 Type 2 diabetes mellitus with foot ulcer L97.312 Non-pressure chronic ulcer of right ankle with fa L97.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 left ankle with  fat L03.115 Cellulitis of right lower limb Modifier: t layer expose layer  exposed Quantity: 1 d Electronic Signature(s) Signed: 08/09/2017 3:16:27 PM By: Evlyn Kanner MD, FACS Signed: 08/09/2017 4:56:27 PM By: Alejandro Mulling Entered By: Alejandro Mulling on 08/09/2017 13:53:16

## 2017-08-11 ENCOUNTER — Encounter: Payer: Self-pay | Admitting: *Deleted

## 2017-08-11 ENCOUNTER — Other Ambulatory Visit: Payer: Self-pay | Admitting: *Deleted

## 2017-08-11 NOTE — Patient Outreach (Signed)
Successful telephone encounter to Jessica Donovan, 71 year old female - check on clinical status as this RN CM followed pt for transition of care/recent hospitalization June 20-21, 2018/program ended 07/12/17.   Spoke with pt, HIPAA identifiers verified, pt reports saw MD at [facility] 8/[facility] for wounds, right ankle is the worse one, set back 8-12 weeks, redness around wound/hurts constantly, was instructed not to wash ankle for 3 days/then be sure to clean with clean soapy water/dry/apply santyl ointment and dressing.  Pt was also instructed to stay off right foot as much as possible, elevate feet above heart.  Pt reports pain medication  helps.   Pt reports left ankle  Had some bleeding when dressing was removed, toe wound healed. RN CM discussed with pt signs/symptoms of infection to report to MD to which pt voiced understanding.  Pt reports to follow up at  Wound care center again 8/27, informed MD will call to be seen sooner if needed.  Pt reports sugars are up, had a birthday last week, friends provided sweets/been watching- today down to 168.   Pt reports she did have 2 separate episodes of chest pain, took one Nitroglycerin SL each time with relief.   RN CM discussed with pt [facility] to [facility]- no further case management needs.    Pt verified had RN CM contact number to call if needs arise in the future.    Plan:  As discussed with pt, plan to close case.            Plan to inform Dr. Emilia Beck of discharge from [facility] CM services- fax case closure letter.           Plan to inform [facility] care management assistant to close case.   Shayne Alken.   Sansa Alkema RN CCM [facility] Care Management  [phone]

## 2017-08-19 ENCOUNTER — Encounter: Payer: PPO | Admitting: Surgery

## 2017-08-19 DIAGNOSIS — E11622 Type 2 diabetes mellitus with other skin ulcer: Secondary | ICD-10-CM | POA: Diagnosis not present

## 2017-08-19 DIAGNOSIS — E11621 Type 2 diabetes mellitus with foot ulcer: Secondary | ICD-10-CM | POA: Diagnosis not present

## 2017-08-19 DIAGNOSIS — L97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 layer exposed: Secondary | ICD-10-CM | POA: Diagnosis not present

## 2017-08-20 DIAGNOSIS — M545 Low back pain: Secondary | ICD-10-CM | POA: Diagnosis not present

## 2017-08-20 DIAGNOSIS — M5416 Radiculopathy, lumbar region: Secondary | ICD-10-CM | POA: Diagnosis not present

## 2017-08-22 NOTE — Progress Notes (Signed)
KIP, KAUTZMAN ([id number]) Visit Report for 08/19/2017 Chief Complaint Document Details Patient Name: Jessica Donovan, INABINET Date of Service: 08/19/2017 2:30 PM Medical Record Number: [id number] Patient Account Number: [id number] Date of Birth/Sex: 04-09-1946 (71 y.o. Female) Treating RN: Phillis Haggis Primary Care Provider: Garlon Hatchet Other Clinician: Referring Provider: Garlon Hatchet Treating Provider/Extender: Rudene Re in Treatment: 6 Information Obtained from: Patient Chief Complaint Patients presents for treatment of an open diabetic ulcer to the right ankle the left ankle and the right third toe Electronic Signature(s) Signed: 08/19/2017 4:28:32 PM By: Evlyn Kanner MD, FACS Entered By: Evlyn Kanner on 08/19/2017 14:56:07 Jessica Donovan ([id number]) -------------------------------------------------------------------------------- Debridement Details Patient Name: Jessica Donovan Date of Service: 08/19/2017 2:30 PM Medical Record Number: [id number] Patient Account Number: [id number] Date of Birth/Sex: Jul 28, 1946 (71 y.o. Female) Treating RN: Ashok Cordia, Debi Primary Care Provider: Garlon Hatchet Other Clinician: Referring Provider: Garlon Hatchet Treating Provider/Extender: Rudene Re in Treatment: 6 Debridement Performed for Wound #2 Right Malleolus Assessment: Performed By: Physician Evlyn Kanner, MD Debridement: Debridement Severity of Tissue Pre Fat layer exposed Debridement: Pre-procedure Verification/Time Out Yes - 14:40 Taken: Start Time: 14:41 Pain Control: Lidocaine 4% Topical Solution Level: Skin/Subcutaneous Tissue Total Area Debrided (L x 0.7 (cm) x 0.5 (cm) = 0.35 (cm) W): Tissue and other Viable, Non-Viable, Exudate, Fibrin/Slough, Subcutaneous material debrided: Instrument: Curette Bleeding: Minimum Hemostasis Achieved: Pressure End Time: 14:43 Procedural Pain: 0 Post Procedural Pain: 0 Response to Treatment:  Procedure was tolerated well Post Debridement Measurements of Total Wound Length: (cm) 0.7 Width: (cm) 0.5 Depth: (cm) 0.3 Volume: (cm) 0.082 Character of Wound/Ulcer Post Requires Further Debridement Debridement: Severity of Tissue Post Debridement: Fat layer exposed Post Procedure Diagnosis Same as Pre-procedure Electronic Signature(s) Signed: 08/19/2017 4:28:32 PM By: Evlyn Kanner MD, FACS Signed: 08/20/2017 4:51:13 PM By: Stevphen Rochester ([id number]) Entered By: Evlyn Kanner on 08/19/2017 14:56:00 Jessica Donovan ([id number]) -------------------------------------------------------------------------------- HPI Details Patient Name: Jessica Donovan Date of Service: 08/19/2017 2:30 PM Medical Record Number: [id number] Patient Account Number: [id number] Date of Birth/Sex: 10-Jun-1946 (71 y.o. Female) Treating RN: Phillis Haggis Primary Care Provider: Garlon Hatchet Other Clinician: Referring Provider: Garlon Hatchet Treating Provider/Extender: Rudene Re in Treatment: 6 History of Present Illness HPI Description: 71 year old diabetic patient who has chronic back pain, collagen vascular disease, coronary artery disease, hypertension and history of osteomyelitis of the toe in February 2017. 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 She also has had joint replacement surgery in the past. She was recently reviewed by Primary care management team and had a problem with the toe on her right foot and her left ankle had minimal drainage, and the right ankle was draining worse. She was seen in the ER on 06/22/2017, and on the x-ray, there was no evidence of any acute bony abnormality. 07/22/2017 -- she says she had a mild abrasion to her right second toe which was inadvertent but there is no open ulceration. 07/30/17 on evaluation today patient appears to be doing better in regard to her abrasion to the right second toe. It  appears there's little bit of a pressure injury although there is no opening at this point. I think this is pushing up on her shoe which is causing the issue at this point and that's due to the contraction that happening with her toe. With that being said patient wonders if there's anything that can be done in that regard for this. She has been tolerating the dressing changes without any complication and  is having really no pain. Her right lateral heel wound appears to be slough covered though there is no evidence of infection. No fevers, chills, nausea, or vomiting noted at this time. 08/09/2017 -- since last week her leg has gotten quite red and painful and she was concerned about the possibility of infection Electronic Signature(s) Signed: 08/19/2017 4:28:32 PM By: Evlyn Kanner MD, FACS Entered By: Evlyn Kanner on 08/19/2017 14:56:12 Jessica Donovan ([id number]) -------------------------------------------------------------------------------- Physical Exam Details Patient Name: Jessica Donovan Date of Service: 08/19/2017 2:30 PM Medical Record Number: [id number] Patient Account Number: [id number] Date of Birth/Sex: Sep 11, 1946 (71 y.o. Female) Treating RN: Phillis Haggis Primary Care Provider: Garlon Hatchet Other Clinician: Referring Provider: Garlon Hatchet Treating Provider/Extender: Rudene Re in Treatment: 6 Constitutional . Pulse regular. Respirations normal and unlabored. Afebrile. . Eyes Nonicteric. Reactive to light. Ears, Nose, Mouth, and Throat Lips, teeth, and gums WNL.[name] Moist mucosa without lesions. Neck supple and nontender. No palpable supraclavicular or cervical adenopathy. Normal sized without goiter. Respiratory WNL. No retractions.. Cardiovascular Pedal Pulses WNL. No clubbing, cyanosis or edema. Lymphatic No adneopathy. No adenopathy. No adenopathy. Musculoskeletal Adexa without tenderness or enlargement.. Digits and nails w/o clubbing,  cyanosis, infection, petechiae, ischemia, or inflammatory conditions.. Integumentary (Hair, Skin) No suspicious lesions. No crepitus or fluctuance. No peri-wound warmth or erythema. No masses.[name] Psychiatric Judgement and insight Intact.. No evidence of depression, anxiety, or agitation.. Notes the left ankle is looking good and has a few microperforations still open. The right ankle requires significant amount of debridement today with a #3 curet and the base is looking much better. There is no surrounding cellulitis. Electronic Signature(s) Signed: 08/19/2017 4:28:32 PM By: Evlyn Kanner MD, FACS Entered By: Evlyn Kanner on 08/19/2017 14:56:58 Jessica Donovan ([id number]) -------------------------------------------------------------------------------- Physician Orders Details Patient Name: Jessica Donovan Date of Service: 08/19/2017 2:30 PM Medical Record Number: [id number] Patient Account Number: [id number] Date of Birth/Sex: [birth year] (71 y.o. Female) Treating RN: Phillis Haggis Primary Care Provider: Garlon Hatchet Other Clinician: Referring Provider: Garlon Hatchet Treating Provider/Extender: Rudene Re in Treatment: 6 Verbal / Phone Orders: No Diagnosis Coding Wound Cleansing Wound #1 Left Malleolus o Cleanse wound with mild soap and water o May Shower, gently pat wound dry prior to applying new dressing. o May shower with protection. Wound #2 Right Malleolus o Cleanse wound with mild soap and water o May Shower, gently pat wound dry prior to applying new dressing. o May shower with protection. Anesthetic Wound #1 Left Malleolus o Topical Lidocaine 4% cream applied to wound bed prior to debridement Wound #2 Right Malleolus o Topical Lidocaine 4% cream applied to wound bed prior to debridement Skin Barriers/Peri-Wound Care Wound #1 Left Malleolus o Skin Prep Wound #2 Right Malleolus o Skin Prep o Antifungal cream Primary Wound  Dressing Wound #2 Right Malleolus o Santyl Ointment Secondary Dressing Wound #1 Left Malleolus o Dry Gauze o Boardered Foam Dressing Wound #2 Right Malleolus Zoll, Tamikia ([id number]) o Dry Gauze o Boardered Foam Dressing Dressing Change Frequency Wound #1 Left Malleolus o Change dressing every other day. Wound #2 Right Malleolus o Change dressing every day. Follow-up Appointments Wound #1 Left Malleolus o Return Appointment in 1 week. Wound #2 Right Malleolus o Return Appointment in 1 week. Edema Control Wound #1 Left Malleolus o Elevate legs to the level of the heart and pump ankles as often as possible Wound #2 Right Malleolus o Elevate legs to the level of the heart and pump ankles as often as possible Additional Orders / Instructions Wound #1  Left Malleolus o Increase protein intake. o Activity as tolerated Wound #2 Right Malleolus o Increase protein intake. o Activity as tolerated Electronic Signature(s) Signed: 08/19/2017 4:28:32 PM By: Evlyn KannerBritto, Mariaisabel Bodiford MD, FACS Signed: 08/20/2017 4:51:13 PM By: Alejandro MullingPinkerton, Debra Entered By: Alejandro MullingPinkerton, Debra on 08/19/2017 14:43:07 Ohman, Britta MccreedyBARBARA ([id number]) -------------------------------------------------------------------------------- Problem List Details Patient Name: Jessica DanceGLASS, Notnamed Date of Service: 08/19/2017 2:30 PM Medical Record Number: [id number] Patient Account Number: [id number] Date of Birth/Sex: May 17, 1946 8(71 y.o. 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 07/08/2017 Yes exposed L03.115 Cellulitis of right lower limb 08/09/2017 Yes Inactive Problems Resolved Problems Electronic Signature(s) Signed: 08/19/2017 4:28:32 PM By: Evlyn KannerBritto, Murray Durrell MD, FACS Entered By: Evlyn KannerBritto, Kataleia Quaranta on 08/19/2017 14:55:33 Jessica DanceGLASS, Sarahanne ([id number]) -------------------------------------------------------------------------------- Progress Note Details Patient Name: Jessica DanceGLASS, Jazari Date of Service: 08/19/2017 2:30 PM Medical Record Number: [id number] Patient Account Number: [id number] Date of Birth/Sex: May 17, 1946 [age] y.o. Female) Treating RN: Ashok CordiaPinkerton, Debi Primary Care Provider: Garlon HatchetMCCONVILLE, ROBERT Other Clinician: Referring Provider: Garlon HatchetMCCONVILLE, ROBERT Treating Provider/Extender: Rudene ReBritto, Deannie Resetar Weeks in Treatment: 6 Subjective Chief Complaint Information obtained from Patient Patients presents for treatment of an open diabetic ulcer to the right ankle the left ankle and the right third toe History of Present Illness (HPI) 71 year old diabetic patient who has chronic back pain, collagen vascular disease, coronary artery disease, hypertension and history of osteomyelitis of the toe in February 2017. 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 She also has had joint replacement surgery in the past. She was recently reviewed by Primary care management team and had a problem with the toe on her right foot and her left ankle had minimal drainage, and the right ankle was draining worse. She was seen in the ER on 06/22/2017, and on the x-ray, there was no evidence of any acute bony abnormality. 07/22/2017 -- she says she had a mild abrasion to her right second toe which was inadvertent but there is no open ulceration. 07/30/17 on evaluation today patient appears to be doing better in regard to her abrasion to the right second toe. It appears there's little bit of a pressure injury although there is no opening at this point. I think this  is pushing up on her shoe which is causing the issue at this point and that's due to the contraction that happening with her toe. With that being said patient wonders if there's anything that can be done in that regard for this. She has been tolerating the dressing changes without any complication and is having really no pain. Her right lateral heel wound appears to be slough covered though there is no evidence of infection. No fevers, chills, nausea, or vomiting noted at this time. 08/09/2017 -- since last week her leg has gotten quite red and painful and she was concerned about the possibility of infection Objective Constitutional Mcquinn, Odelle ([id number]) Pulse regular. Respirations normal and unlabored. Afebrile. Vitals Time Taken: 2:24 PM, Height: 66 in, Weight: 191 lbs, BMI: 30.8, Temperature: 98.2 F, Pulse: 91 bpm, Respiratory Rate: 18 breaths/min, Blood Pressure: 144/86 mmHg. Eyes Nonicteric. Reactive to light. Ears, Nose, Mouth, and Throat Lips, teeth, and gums WNL.[name]. Moist mucosa without lesions.  Neck supple and nontender. No palpable supraclavicular or cervical adenopathy. Normal sized without goiter. Respiratory WNL. No retractions.. Cardiovascular Pedal Pulses WNL. No clubbing, cyanosis or edema. Lymphatic No adneopathy. No adenopathy. No adenopathy. Musculoskeletal Adexa without tenderness or enlargement.. Digits and nails w/o clubbing, cyanosis, infection, petechiae, ischemia, or inflammatory conditions.[name] Psychiatric Judgement and insight Intact.. No evidence of depression, anxiety, or agitation.. General Notes: the left ankle is looking good and has a few microperforations still open. The right ankle requires significant amount of debridement today with a #3 curet and the base is looking much better. There is no surrounding cellulitis. Integumentary (Hair, Skin) No suspicious lesions. No crepitus or fluctuance. No peri-wound warmth or erythema. No masses.. Wound  #1 status is Open. Original cause of wound was Gradually Appeared. The wound is located on the Left Malleolus. The wound measures 0.1cm length x 0.1cm width x 0.1cm depth; 0.008cm^2 area and 0.001cm^3 volume. There is no tunneling or undermining noted. There is a large amount of serous drainage noted. The wound margin is flat and intact. There is large (67-100%) pink granulation within the wound bed. There is a small (1-33%) amount of necrotic tissue within the wound bed including Adherent Slough. Periwound temperature was noted as No Abnormality. The periwound has tenderness on palpation. Wound #2 status is Open. Original cause of wound was Gradually Appeared. The wound is located on the Right Malleolus. The wound measures 0.7cm length x 0.5cm width x 0.2cm depth; 0.275cm^2 area and 0.055cm^3 volume. There is no tunneling or undermining noted. There is a large amount of serous drainage noted. The wound margin is flat and intact. There is no granulation within the wound bed. There is a large (67-100%) amount of necrotic tissue within the wound bed including Adherent Slough. The periwound skin Crocker, Annet ([id number]) appearance exhibited: Rubor, Erythema. The surrounding wound skin color is noted with erythema which is circumferential. Periwound temperature was noted as No Abnormality. The periwound has tenderness on palpation. 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 There was a Skin/Subcutaneous Tissue Debridement ([id number]) debridement with total area of 0.35 sq  cm performed by Evlyn Kanner, MD. with the following instrument(s): Curette to remove Viable and Non-Viable tissue/material including Exudate, Fibrin/Slough, and Subcutaneous after achieving pain control using Lidocaine 4% Topical Solution. A time out was conducted at 14:40, prior to the start of the procedure. A Minimum amount of bleeding was controlled with Pressure. The procedure was tolerated well with a pain level of 0 throughout and a pain level of 0 following the procedure. Post Debridement Measurements: 0.7cm length x 0.5cm width x 0.3cm depth; 0.082cm^3 volume. Character of Wound/Ulcer Post Debridement requires further debridement. Severity of Tissue Post Debridement is: Fat layer exposed. Post procedure Diagnosis Wound #2: Same as Pre-Procedure Plan Wound Cleansing: Wound #1 Left Malleolus: Cleanse wound with mild soap and water May Shower, gently pat wound dry prior to applying new dressing. TATJANA, TURCOTT ([id number]) May shower with protection. Wound #2 Right Malleolus: Cleanse wound with mild soap and water May Shower, gently pat wound dry prior to applying new dressing. May shower with protection. Anesthetic: Wound #  1 Left Malleolus: Topical Lidocaine 4% cream applied to wound bed prior to debridement Wound #2 Right Malleolus: Topical Lidocaine 4% cream applied to wound bed prior to debridement Skin Barriers/Peri-Wound Care: Wound #1 Left Malleolus: Skin Prep Wound #2 Right Malleolus: Skin Prep Antifungal cream Primary Wound Dressing: Wound #2 Right Malleolus: Santyl Ointment Secondary Dressing: Wound #1 Left Malleolus: Dry Gauze Boardered Foam Dressing Wound #2 Right Malleolus: Dry Gauze Boardered Foam Dressing Dressing Change Frequency: Wound #1 Left Malleolus: Change dressing every other day. Wound #2 Right Malleolus: Change dressing every day. Follow-up Appointments: Wound #1 Left Malleolus: Return Appointment in 1 week. Wound #2 Right  Malleolus: Return Appointment in 1 week. Edema Control: Wound #1 Left Malleolus: Elevate legs to the level of the heart and pump ankles as often as possible Wound #2 Right Malleolus: Elevate legs to the level of the heart and pump ankles as often as possible Additional Orders / Instructions: Wound #1 Left Malleolus: Increase protein intake. Activity as tolerated Wound #2 Right Malleolus: Increase protein intake. Activity as tolerated Baillargeon, Martesha ([id number]) After sharp debridement and review today, I have recommended: 1. Santyl ointment for the right lateral leg which she has already been using. 2. a bordered foam to her left lateral ankle. 3. Bactrim DS to be started last week to be completed 4. good control of her diabetes mellitus 5. Adequate offloading of both these areas Electronic Signature(s) Signed: 08/19/2017 4:28:32 PM By: Evlyn Kanner MD, FACS Entered By: Evlyn Kanner on 08/19/2017 14:58:23 Jessica Donovan ([id number]) -------------------------------------------------------------------------------- SuperBill Details Patient Name: Jessica Donovan Date of Service: 08/19/2017 Medical Record Number: [id number] Patient Account Number: [id number] Date of Birth/Sex: 1946/01/24 (71 y.o. 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 L03.115 Cellulitis of right lower limb Facility Procedures CPT4 Code Description: [id number] 11042 - DEB SUBQ TISSUE 20 SQ CM/< ICD-10 Description Diagnosis E11.621 Type 2 diabetes mellitus with foot ulcer L97.312 Non-pressure chronic ulcer of right ankle  with fa L97.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 left ankle  with fat L03.115 Cellulitis of right lower limb Modifier: t layer expose layer exposed Quantity: 1 d Physician Procedures CPT4 Code Description: [id number] 11042 - WC PHYS SUBQ TISS 20 SQ CM ICD-10 Description Diagnosis E11.621 Type 2 diabetes mellitus with foot ulcer L97.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

## 2017-08-22 NOTE — Progress Notes (Signed)
Jessica Donovan, Jessica Donovan ([id number]) Visit Report for 08/19/2017 Arrival Information Details Patient Name: Jessica Donovan, Jessica Donovan Date of Service: 08/19/2017 2:30 PM Medical Record Number: [id number] Patient Account Number: [id number] Date of Birth/Sex: November 22, 1946 (71 y.o. Female) Treating RN: Jessica Donovan Primary Care Jessica Donovan: Jessica Donovan Other Clinician: Referring Grae Leathers: Jessica Donovan Treating Jessica Donovan Weeks in Treatment: Donovan Visit Information History Since Last Visit All ordered tests and consults were completed: No Patient Arrived: Jessica HumphreysWalker Added or deleted any medications: No Arrival Time: 14:23 Any new allergies or adverse reactions: No Accompanied By: self Had a fall or experienced change in No Transfer Assistance: None activities of daily living that may affect Patient Identification Verified: Yes risk of falls: Secondary Verification Process Yes Signs or symptoms of abuse/neglect since last No Completed: visito Patient Requires Transmission- No Hospitalized since last visit: No Based Precautions: Has Dressing in Place as Prescribed: Yes Patient Has Alerts: Yes Pain Present Now: No Patient Alerts: Patient on Blood Thinner ASA Electronic Signature(s) Signed: 08/20/2017 4:51:13 PM By: Jessica Donovan Entered By: Jessica Donovan on 08/19/2017 14:24:30 [name], Jessica Donovan ([id number]) -------------------------------------------------------------------------------- Encounter Discharge Information Details Patient Name: Jessica Donovan, Jessica Donovan Date of Service: 08/19/2017 2:30 PM Medical Record Number: [id number] Patient Account Number: [id number] Date of Birth/Sex: November 22, 1946 [age] y.o. Female) Treating RN: Jessica Donovan Primary Care Jessica Donovan: Jessica Donovan Other Clinician: Referring Manly Nestle: Jessica Donovan Treating Latroya Ng/Extender: Jessica ReBritto, Jessica Donovan Encounter Discharge Information Items Discharge Pain Level: 0 Discharge  Condition: Stable Ambulatory Status: Walker Discharge Destination: Home Transportation: Private Auto Accompanied By: self Schedule Follow-up Appointment: Yes Medication Reconciliation completed No and provided to Patient/Care Remi Lopata: Provided on Clinical Summary of Care: 08/19/2017 Form Type Recipient Paper Patient BG Electronic Signature(s) Signed: 08/20/2017 4:51:13 PM By: Jessica Donovan Entered By: Jessica Donovan on 08/19/2017 15:04:29 Jessica Donovan, Jessica Donovan ([id number]) -------------------------------------------------------------------------------- Lower Extremity Assessment Details Patient Name: Jessica Donovan, Jessica Donovan Date of Service: 08/19/2017 2:30 PM Medical Record Number: [id number] Patient Account Number: [id number] Date of Birth/Sex: November 22, 1946 [age] y.o. Female) Treating RN: Jessica Donovan Primary Care Jahmeir Geisen: Jessica Donovan Other Clinician: Referring Alys Dulak: Jessica Donovan Treating Thailan Sava/Extender: Jessica ReBritto, Jessica Donovan Vascular Assessment Pulses: Dorsalis Pedis Palpable: [Left:Yes] [Right:Yes] Posterior Tibial Extremity colors, hair growth, and conditions: Extremity Color: [Left:Normal] [Right:Normal] Temperature of Extremity: [Left:Warm] [Right:Warm] Capillary Refill: [Left:< 3 seconds] [Right:< 3 seconds] Toe Nail Assessment Left: Right: Thick: No No Discolored: No No Deformed: No No Improper Length and Hygiene: No No Electronic Signature(s) Signed: 08/20/2017 4:51:13 PM By: Jessica Donovan Entered By: Jessica Donovan on 08/19/2017 14:35:44 Jessica Donovan ([id number]) -------------------------------------------------------------------------------- Multi Wound Chart Details Patient Name: Jessica Donovan, Jessica Donovan Date of Service: 08/19/2017 2:30 PM Medical Record Number: [id number] Patient Account Number: [id number] Date of Birth/Sex: November 22, 1946 (71 y.o. Female) Treating RN: Jessica Donovan Primary Care Delanda Bulluck: Jessica Donovan Other  Clinician: Referring Jessica Donovan: Jessica Donovan Treating Jessica Donovan Weeks in Treatment: Donovan Vital Signs Height(in): 66 Pulse(bpm): 91 Weight(lbs): 191 Blood Pressure 144/86 (mmHg): Body Mass Index(BMI): 31 Temperature(F): 98.2 Respiratory Rate 18 (breaths/min): Photos: [1:No Photos] [2:No Photos] [N/A:N/A] Wound Location: [1:Left Malleolus] [2:Right Malleolus] [N/A:N/A] Wounding Event: [1:Gradually Appeared] [2:Gradually Appeared] [N/A:N/A] Primary Etiology: [1:Diabetic Wound/Ulcer of the Lower Extremity] [2:Diabetic Wound/Ulcer of the Lower Extremity] [N/A:N/A] Comorbid History: [1:Sleep Apnea, Coronary Artery Disease, Hypertension, Myocardial Infarction, Type II Diabetes, History of pressure wounds, Osteoarthritis, Neuropathy, Confinement Anxiety] [2:Sleep Apnea, Coronary Artery Disease, Hypertension,  Myocardial Infarction, Type II Diabetes, History of pressure wounds, Osteoarthritis, Neuropathy, Confinement Anxiety] [N/A:N/A] Date Acquired: [1:02/17/2017] [2:02/17/2017] [N/A:N/A] Weeks of Treatment: [1:Donovan] [2:Donovan] [N/A:N/A] Wound Status: [1:Open] [2:Open] [  N/A:N/A] Measurements L x W x D 0.1x0.1x0.1 [2:0.7x0.5x0.2] [N/A:N/A] (cm) Area (cm) : [1:0.008] [2:0.275] [N/A:N/A] Volume (cm) : [1:0.001] [2:0.055] [N/A:N/A] % Reduction in Area: [1:97.70%] [2:77.60%] [N/A:N/A] % Reduction in Volume: 97.10% [2:55.30%] [N/A:N/A] Classification: [1:Grade 1] [2:Grade 1] [N/A:N/A] Exudate Amount: [1:Large] [2:Large] [N/A:N/A] Exudate Type: [1:Serous] [2:Serous] [N/A:N/A] Exudate Color: [1:amber] [2:amber] [N/A:N/A] Wound Margin: [1:Flat and Intact] [2:Flat and Intact] [N/A:N/A] Granulation Amount: [1:Large (67-100%)] [2:None Present (0%)] [N/A:N/A] Granulation Quality: [1:Pink] [2:N/A] [N/A:N/A] Necrotic Amount: Small (1-33%) Large (67-100%) N/A Epithelialization: None None N/A Debridement: N/A Debridement (16109- N/A 11047) Pre-procedure N/A 14:40  N/A Verification/Time Out Taken: Pain Control: N/A Lidocaine 4% Topical N/A Solution Tissue Debrided: N/A Fibrin/Slough, Exudates, N/A Subcutaneous Level: N/A Skin/Subcutaneous N/A Tissue Debridement Area (sq N/A 0.35 N/A cm): Instrument: N/A Curette N/A Bleeding: N/A Minimum N/A Hemostasis Achieved: N/A Pressure N/A Procedural Pain: N/A 0 N/A Post Procedural Pain: N/A 0 N/A Debridement Treatment N/A Procedure was tolerated N/A Response: well Post Debridement N/A 0.7x0.5x0.3 N/A Measurements L x W x D (cm) Post Debridement N/A 0.082 N/A Volume: (cm) Periwound Skin Texture: No Abnormalities Noted No Abnormalities Noted N/A Periwound Skin No Abnormalities Noted No Abnormalities Noted N/A Moisture: Periwound Skin Color: No Abnormalities Noted Erythema: Yes N/A Rubor: Yes Erythema Location: N/A Circumferential N/A Temperature: No Abnormality No Abnormality N/A Tenderness on Yes Yes N/A Palpation: Wound Preparation: Ulcer Cleansing: Ulcer Cleansing: N/A Rinsed/Irrigated with Rinsed/Irrigated with Saline Saline Topical Anesthetic Topical Anesthetic Applied: Other: lidocaine Applied: Other: lidocaine 4% 4% Procedures Performed: N/A Debridement N/A Treatment Notes Electronic Signature(s) Signed: 08/19/2017 4:28:32 PM By: Evlyn Kanner MD, FACS [place], Jessica Donovan ([id number]) Entered By: Evlyn Kanner on 08/19/2017 14:55:39 NORLEEN, XIE ([id number]) -------------------------------------------------------------------------------- Multi-Disciplinary Care Plan Details Patient Name: Jessica Donovan Date of Service: 08/19/2017 2:30 PM Medical Record Number: [id number] Patient Account Number: [id number] Date of Birth/Sex: 11/25/1946 (71 y.o. Female) Treating RN: Jessica Cordia, Donovan Primary Care Lyra Alaimo: Jessica Hatchet Other Clinician: Referring Dula Havlik: Jessica Hatchet Treating Jodiann Ognibene/Extender: Jessica Re in Treatment: Donovan Active Inactive ` Orientation to the  Wound Care Program Nursing Diagnoses: Knowledge deficit related to the wound healing center program Goals: Patient/caregiver will verbalize understanding of the Wound Healing Center Program Date Initiated: 07/08/2017 Target Resolution Date: 10/08/2017 Goal Status: Active Interventions: Provide education on orientation to the wound center Notes: ` Peripheral Neuropathy Nursing Diagnoses: Knowledge deficit related to disease process and management of peripheral neurovascular dysfunction Potential alteration in peripheral tissue perfusion (select prior to confirmation of diagnosis) Goals: Patient/caregiver will verbalize understanding of disease process and disease management Date Initiated: 07/08/2017 Target Resolution Date: 10/08/2017 Goal Status: Active Interventions: Assess signs and symptoms of neuropathy upon admission and as needed Provide education on Management of Neuropathy and Related Ulcers Provide education on Management of Neuropathy upon discharge from the Wound Center Treatment Activities: Patient referred for customized footwear/offloading : 07/08/2017 Patient referred to diabetes educator : 07/08/2017 Jessica Donovan ([id number]) Notes: ` Wound/Skin Impairment Nursing Diagnoses: Impaired tissue integrity Knowledge deficit related to ulceration/compromised skin integrity Goals: Patient/caregiver will verbalize understanding of skin care regimen Date Initiated: 07/08/2017 Target Resolution Date: 10/08/2017 Goal Status: Active Ulcer/skin breakdown will have a volume reduction of 30% by week 4 Date Initiated: 07/08/2017 Target Resolution Date: 10/08/2017 Goal Status: Active Ulcer/skin breakdown will have a volume reduction of 50% by week 8 Date Initiated: 07/08/2017 Target Resolution Date: 10/08/2017 Goal Status: Active Ulcer/skin breakdown will have a volume reduction of 80% by week 12 Date Initiated: 07/08/2017 Target Resolution Date: 10/08/2017 Goal Status:  Active Ulcer/skin breakdown will  heal within 14 weeks Date Initiated: 07/08/2017 Target Resolution Date: 10/08/2017 Goal Status: Active Interventions: Assess patient/caregiver ability to obtain necessary supplies Assess patient/caregiver ability to perform ulcer/skin care regimen upon admission and as needed Assess ulceration(s) every visit Treatment Activities: Referred to DME Rekisha Welling for dressing supplies : 07/08/2017 Skin care regimen initiated : 07/08/2017 Topical wound management initiated : 07/08/2017 Notes: Electronic Signature(s) Signed: 08/20/2017 4:51:13 PM By: Jessica Mulling Entered By: Jessica Mulling on 08/19/2017 14:37:08 [place], Jessica Donovan ([id number]) -------------------------------------------------------------------------------- Pain Assessment Details Patient Name: Jessica Donovan Date of Service: 08/19/2017 2:30 PM Medical Record Number: [id number] Patient Account Number: [id number] Date of Birth/Sex: 17-Sep-1946 (71 y.o. Female) Treating RN: Phillis Haggis Primary Care Donato Studley: Jessica Hatchet Other Clinician: Referring Eleanor Gatliff: Jessica Hatchet Treating Modene Andy/Extender: Jessica Re in Treatment: Donovan Active Problems Location of Pain Severity and Description of Pain Patient Has Paino No Site Locations Pain Management and Medication Current Pain Management: Electronic Signature(s) Signed: 08/20/2017 4:51:13 PM By: Jessica Mulling Entered By: Jessica Mulling on 08/19/2017 14:24:36 Jessica Donovan, Jessica Donovan ([id number]) -------------------------------------------------------------------------------- Patient/Caregiver Education Details Patient Name: Jessica Donovan Date of Service: 08/19/2017 2:30 PM Medical Record Number: [id number] Patient Account Number: [id number] Date of Birth/Gender: 1946/07/11 (71 y.o. Female) Treating RN: Phillis Haggis Primary Care Physician: Jessica Hatchet Other Clinician: Referring Physician: Garlon Hatchet Treating  Physician/Extender: Jessica Re in Treatment: Donovan Education Assessment Education Provided To: Patient Education Topics Provided Wound/Skin Impairment: Handouts: Other: change dressing as ordered Methods: Demonstration, Explain/Verbal Responses: State content correctly Electronic Signature(s) Signed: 08/20/2017 4:51:13 PM By: Jessica Mulling Entered By: Jessica Mulling on 08/19/2017 15:04:44 [place], Jessica Donovan ([id number]) -------------------------------------------------------------------------------- Wound Assessment Details Patient Name: Jessica Donovan Date of Service: 08/19/2017 2:30 PM Medical Record Number: [id number] Patient Account Number: [id number] Date of Birth/Sex: [birth year] (71 y.o. Female) Treating RN: Jessica Cordia, Donovan Primary Care Belkys Henault: Jessica Hatchet Other Clinician: Referring Kaicee Scarpino: Jessica Hatchet Treating Renee Beale/Extender: Jessica Re in Treatment: Donovan Wound Status Wound Number: 1 Primary Diabetic Wound/Ulcer of the Lower Etiology: Extremity Wound Location: Left Malleolus Wound Open Wounding Event: Gradually Appeared Status: Date Acquired: 02/17/2017 Comorbid Sleep Apnea, Coronary Artery Disease, Weeks Of Treatment: Donovan History: Hypertension, Myocardial Infarction, Clustered Wound: No Type II Diabetes, History of pressure wounds, Osteoarthritis, Neuropathy, Confinement Anxiety Photos Photo Uploaded By: Jessica Mulling on 08/19/2017 16:43:28 Wound Measurements Length: (cm) 0.1 Width: (cm) 0.1 Depth: (cm) 0.1 Area: (cm) 0.008 Volume: (cm) 0.001 % Reduction in Area: 97.7% % Reduction in Volume: 97.1% Epithelialization: None Tunneling: No Undermining: No Wound Description Classification: Grade 1 Wound Margin: Flat and Intact Exudate Amount: Large Exudate Type: Serous Exudate Color: amber Foul Odor After Cleansing: No Slough/Fibrino Yes Wound Bed Granulation Amount: Large (67-100%) Granulation Quality: Pink Jessica Donovan,  Jessica Donovan ([id number]) Necrotic Amount: Small (1-33%) Necrotic Quality: Adherent Slough Periwound Skin Texture Texture Color No Abnormalities Noted: No No Abnormalities Noted: No Moisture Temperature / Pain No Abnormalities Noted: No Temperature: No Abnormality Tenderness on Palpation: Yes Wound Preparation Ulcer Cleansing: Rinsed/Irrigated with Saline Topical Anesthetic Applied: Other: lidocaine 4%, Treatment Notes Wound #1 (Left Malleolus) 1. Cleansed with: Clean wound with Normal Saline 2. Anesthetic Topical Lidocaine 4% cream to wound bed prior to debridement 3. Peri-wound Care: Skin Prep 5. Secondary Dressing Applied Bordered Foam Dressing Dry Gauze Electronic Signature(s) Signed: 08/20/2017 4:51:13 PM By: Jessica Mulling Entered By: Jessica Mulling on 08/19/2017 14:35:04 Jessica Donovan, Jessica Donovan ([id number]) -------------------------------------------------------------------------------- Wound Assessment Details Patient Name: Jessica Donovan Date of Service: 08/19/2017 2:30 PM Medical Record Number: [id number] Patient Account Number: [id number] Date of Birth/Sex: Aug 26, 1946 (71 y.o. Female) Treating RN: Jessica Cordia,  Donovan Primary Care Nakiah Osgood: Jessica Hatchet Other Clinician: Referring Abbrielle Batts: Jessica Hatchet Treating Krista Som/Extender: Jessica Re in Treatment: Donovan Wound Status Wound Number: 2 Primary Diabetic Wound/Ulcer of the Lower Etiology: Extremity Wound Location: Right Malleolus Wound Open Wounding Event: Gradually Appeared Status: Date Acquired: 02/17/2017 Comorbid Sleep Apnea, Coronary Artery Disease, Weeks Of Treatment: Donovan History: Hypertension, Myocardial Infarction, Clustered Wound: No Type II Diabetes, History of pressure wounds, Osteoarthritis, Neuropathy, Confinement Anxiety Photos Photo Uploaded By: Jessica Mulling on 08/19/2017 16:43:28 Wound Measurements Length: (cm) 0.7 Width: (cm) 0.5 Depth: (cm) 0.2 Area: (cm) 0.275 Volume:  (cm) 0.055 % Reduction in Area: 77.Donovan% % Reduction in Volume: 55.3% Epithelialization: None Tunneling: No Undermining: No Wound Description Classification: Grade 1 Wound Margin: Flat and Intact Exudate Amount: Large Exudate Type: Serous Exudate Color: amber Foul Odor After Cleansing: No Slough/Fibrino Yes Wound Bed Granulation Amount: None Present (0%) Necrotic Amount: Large (67-100%) Bullinger, Deina ([id number]) Necrotic Quality: Adherent Slough Periwound Skin Texture Texture Color No Abnormalities Noted: No No Abnormalities Noted: No Erythema: Yes Moisture Erythema Location: Circumferential No Abnormalities Noted: No Rubor: Yes Temperature / Pain Temperature: No Abnormality Tenderness on Palpation: Yes Wound Preparation Ulcer Cleansing: Rinsed/Irrigated with Saline Topical Anesthetic Applied: Other: lidocaine 4%, Treatment Notes Wound #2 (Right Malleolus) 1. Cleansed with: Clean wound with Normal Saline 2. Anesthetic Topical Lidocaine 4% cream to wound bed prior to debridement 3. Peri-wound Care: Skin Prep 4. Dressing Applied: Santyl Ointment 5. Secondary Dressing Applied Bordered Foam Dressing Dry Gauze Electronic Signature(s) Signed: 08/20/2017 4:51:13 PM By: Jessica Mulling Entered By: Jessica Mulling on 08/19/2017 14:32:40 M[place], Jessica Donovan ([id number]) -------------------------------------------------------------------------------- Vitals Details Patient Name: Jessica Donovan Date of Service: 08/19/2017 2:30 PM Medical Record Number: [id number] Patient Account Number: [id number] Date of Birth/Sex: [birth year] (71 y.o. Female) Treating RN: Jessica Cordia, Donovan Primary Care Oliva Montecalvo: Jessica Hatchet Other Clinician: Referring Leita Lindbloom: Jessica Hatchet Treating Leonard Feigel/Extender: Jessica Re in Treatment: Donovan Vital Signs Time Taken: 14:24 Temperature (F): 98.2 Height (in): 66 Pulse (bpm): 91 Weight (lbs): 191 Respiratory Rate (breaths/min):  18 Body Mass Index (BMI): 30.8 Blood Pressure (mmHg): 144/86 Reference Range: 80 - 120 mg / dl Electronic Signature(s) Signed: 08/20/2017 4:51:13 PM By: Jessica Mulling Entered By: Jessica Mulling on 08/19/2017 14:26:37

## 2017-08-23 NOTE — Progress Notes (Signed)
Cardiology Office Note  Date:  08/24/2017   ID:  Jessica Donovan, DOB January 27, 1946, MRN [id number]  PCP:  Garlon Hatchet, MD   Chief Complaint  Patient presents with  . other    New patient. Patient c/o SOB and chest pain. Meds reviewed verbally with patient.     HPI:  71 year old woman  history of  coronary artery disease previous stent to the distal RCA and mid circumflex June 2014,  repeat catheterization November 2016 showing patent stents, Poorly controlled diabetes with wounds requiring amputations of toes Hypertension,  hyperlipidemia,  frequent falls,  anxiety,  chronic back pain,  In hospital 06/10/2017 for chest pain, neg enz and stress test OSA presenting for follow up of her chest pain   Labs reviewed With her on today's visit Total chol 158, LDL 64 HBa1C 7.1 Hemoglobin A1c 6.6 late 2017  seen several cardiologists in [place],  then [place]  On recent hospitalization, requesting [facility] records reviewed with the patient in detail  She reported having chest pain prior to hospital admission Atypical in nature, Concerned that her stents have "fallen out" Cardiac enzymes negative 3 EKG unchanged from baseline  She had Stress test 06/10/2017 Pharmacological myocardial perfusion imaging study with no significant  ischemia Normal wall motion, EF estimated at 90% No EKG changes concerning for ischemia at peak stress or in recovery. Low risk scan  In follow-up she denies any further chest pain symptoms She does not use her CPAP. Previously told that she was gone a she did not use it  EKG personally reviewed by myself on todays visit Shows normal sinus rhythm rate 74 bpm poor R-wave progression through the anterior precordial leads left axis deviation   PMH:   has a past medical history of Anxiety; Chronic back pain; Collagen vascular disease (HCC); Coronary artery disease; Diabetes mellitus without complication (HCC); Hypertension; Low back pain;  Myocardial infarction (HCC); Osteomyelitis of toe (HCC) (01/23/2016); and Stroke [facility]).  PSH:    Past Surgical History:  Procedure Laterality Date  . AMPUTATION TOE Left 11/10/2015   Procedure: AMPUTATION TOE;  Surgeon: Linus Galas, MD;  Location: [facility] ORS;  Service: Podiatry;  Laterality: Left;  . AMPUTATION TOE Left 01/24/2016   Procedure: AMPUTATION TOE (2nd mpj);  Surgeon: Linus Galas, DPM;  Location: [facility] ORS;  Service: Podiatry;  Laterality: Left;  . CARDIAC CATHETERIZATION N/A 11/12/2015   Procedure: Left Heart Cath and Coronary Angiography;  Surgeon: Marcina Millard, MD;  Location: [facility] INVASIVE CV LAB;  Service: Cardiovascular;  Laterality: N/A;  . COLONOSCOPY WITH PROPOFOL N/A 07/20/2017   Procedure: COLONOSCOPY WITH PROPOFOL;  Surgeon: Wyline Mood, MD;  Location: [facility] ENDOSCOPY;  Service: Endoscopy;  Laterality: N/A;  . JOINT REPLACEMENT    . TOE AMPUTATION      Current Outpatient Prescriptions  Medication Sig Dispense Refill  . acetaminophen (TYLENOL) 500 MG tablet Take 1,000 mg by mouth every 4 (four) hours as needed for mild pain or headache.     . ALPRAZolam (XANAX) 1 MG tablet Take 1 mg by mouth 3 (three) times daily.     [name] aspirin EC 81 MG tablet Take 81 mg by mouth daily.    [name] atorvastatin (LIPITOR) 20 MG tablet Take 20 mg by mouth at bedtime.     . Biotin 5 MG CAPS Take 5 mg by mouth daily.     . celecoxib (CELEBREX) 200 MG capsule Take 200 mg by mouth daily.     . cyclobenzaprine (FLEXERIL) 10 MG tablet Take 1  tablet (10 mg total) by mouth 3 (three) times daily as needed for muscle spasms. 20 tablet 0  . diphenoxylate-atropine (LOMOTIL) 2.5-0.025 MG tablet Take 1 tablet by mouth every 4 (four) hours as needed for diarrhea or loose stools.     . ferrous sulfate 325 (65 FE) MG tablet Take 325 mg by mouth daily with breakfast.    . fesoterodine (TOVIAZ) 8 MG TB24 tablet Take 8 mg by mouth at bedtime.     . furosemide (LASIX) 20 MG tablet Take 1 tablet (20 mg total) by mouth  daily as needed. (Patient taking differently: Take 20 mg by mouth daily as needed for fluid. ) 30 tablet 0  . insulin aspart (NOVOLOG) 100 UNIT/ML injection Inject 3-15 Units into the skin 3 (three) times daily with meals as needed for high blood sugar. Pt uses as needed per sliding scale:    Less than 140:  0 units  140-180:  3 units 181-220:  4 units 221- 260:  6 units 261- 320:  8 units 321-360:  10 units 361-400:  12 units Greater than 400:  15 units    . Insulin Glargine (TOUJEO SOLOSTAR) 300 UNIT/ML SOPN Inject 30 Units into the skin daily. 1 pen 0  . isosorbide mononitrate (IMDUR) 30 MG 24 hr tablet Take 30 mg by mouth daily.     [name] liothyronine (CYTOMEL) 25 MCG tablet Take 25 mcg by mouth daily.    [name] lisinopril (PRINIVIL,ZESTRIL) 20 MG tablet Take 20 mg by mouth daily.    . metoprolol tartrate (LOPRESSOR) 25 MG tablet Take 25 mg by mouth 2 (two) times daily.    . mirtazapine (REMERON) 15 MG tablet Take 15 mg by mouth at bedtime.    [name] morphine (MS CONTIN) 15 MG 12 hr tablet Take 1 tablet (15 mg total) by mouth every 12 (twelve) hours. 30 tablet 0  . Multiple Vitamin (MULTIVITAMIN WITH MINERALS) TABS tablet Take 1 tablet by mouth daily.    . mupirocin ointment (BACTROBAN) 2 % mupirocin 2 % topical ointment    . nitrofurantoin (MACRODANTIN) 100 MG capsule Take 100 mg by mouth daily.    . nitrofurantoin, macrocrystal-monohydrate, (MACROBID) 100 MG capsule Take by mouth.    . nitroGLYCERIN (NITROSTAT) 0.4 MG SL tablet Place 0.4 mg under the tongue every 5 (five) minutes as needed for chest pain. Reported on 04/16/2016    . omeprazole (PRILOSEC) 20 MG capsule Take 20 mg by mouth daily.    . Omeprazole 20 MG TBEC omeprazole 20 mg capsule,delayed release  TAKE ONE CAPSULE BY MOUTH EACH MORNING AS DIRECTED    . Polyethylene Glycol 3350 (MIRALAX PO) Miralax 17 gram oral powder packet  take 1 packet by oral route  every day mixed with 8 oz. water, juice, soda, coffee or tea    . polyethylene  glycol powder (GLYCOLAX/MIRALAX) powder 17 grams as needed each time for constpation 255 g 3  . pregabalin (LYRICA) 150 MG capsule Take 1 capsule (150 mg total) by mouth 2 (two) times daily. (Patient taking differently: Take 150 mg by mouth 3 (three) times daily. )    . promethazine (PHENERGAN) 25 MG tablet Take 25 mg by mouth every 6 (six) hours as needed for nausea.     . solifenacin (VESICARE) 10 MG tablet Vesicare 10 mg tablet  take 1 tablet by mouth  every day    . ticagrelor (BRILINTA) 90 MG TABS tablet Brilinta 90 mg tablet    . tolterodine (DETROL LA) 4  MG 24 hr capsule Take by mouth.    . traZODone (DESYREL) 100 MG tablet Take 100 mg by mouth at bedtime.    [name]. venlafaxine XR (EFFEXOR-XR) 75 MG 24 hr capsule Take 225 mg by mouth daily.    . vitamin B-12 (CYANOCOBALAMIN) 1000 MCG tablet Take 1,000 mcg by mouth daily.    . Vitamin D, Ergocalciferol, (DRISDOL) 50000 units CAPS capsule Take 50,000 Units by mouth every Sunday.      No current facility-administered medications for this visit.      Allergies:   Codeine; Flu virus vaccine; Influenza vaccines; Methadone; Oxycodone-acetaminophen; Percocet [oxycodone-acetaminophen]; Tetanus antitoxin; Tetanus toxoids; Penicillin g; and Tetracyclines & related   Social History:  The patient  reports that she has never smoked. She has never used smokeless tobacco. She reports that she does not drink alcohol or use drugs.   Family History:   family history includes CAD in her father and mother.    Review of Systems: Review of Systems  Constitutional: Negative.   Respiratory: Negative.   Cardiovascular: Negative.   Gastrointestinal: Negative.   Musculoskeletal: Negative.   Neurological: Negative.   Psychiatric/Behavioral: Negative.   All other systems reviewed and are negative.    PHYSICAL EXAM: VS:  BP 139/76 (BP Location: Right Arm, Patient Position: Sitting, Cuff Size: Normal)   Pulse 74   Ht 5\' 7"  (1.702 m)   Wt 196 lb 4 oz (89  kg)   BMI 30.74 kg/m  , BMI Body mass index is 30.74 kg/m. GEN: Well nourished, well developed, in no acute distress  HEENT: normal  Neck: no JVD, carotid bruits, or masses Cardiac: RRR; no murmurs, rubs, or gallops,no edema  Respiratory:  clear to auscultation bilaterally, normal work of breathing GI: soft, nontender, nondistended, + BS MS: no deformity or atrophy  Skin: warm and dry, no rash Neuro:  Strength and sensation are intact Psych: euthymic mood, full affect    Recent Labs: 02/11/2017: Magnesium 2.2 06/09/2017: TSH 1.894 06/22/2017: ALT 14; BUN 23; Creatinine, Ser 0.75; Hemoglobin 13.3; Platelets 240; Potassium 4.7; Sodium 137    Lipid Panel Lab Results  Component Value Date   CHOL 158 06/09/2017   HDL 46 06/09/2017   LDLCALC 64 06/09/2017   TRIG 242 (H) 06/09/2017      Wt Readings from Last 3 Encounters:  08/24/17 196 lb 4 oz (89 kg)  07/20/17 194 lb (88 kg)  06/24/17 188 lb 12.8 oz (85.6 kg)       ASSESSMENT AND PLAN:  Angina effort (HCC) She reports stable anginal symptoms Recent stress test showing no ischemia, no further workup at this time  Coronary artery disease of native artery of native heart with stable angina pectoris (HCC) Continue current medications  Uncontrolled type 2 diabetes mellitus with other circulatory complication, without long-term current use of insulin (HCC)   Chronic chest pain Long history of changing cardiologists, 2016 workup including cardiac catheterization showing patent stents, stress test 2 2017 and last month with no ischemia  30% lesions noted on catheterization November 2016 No further workup needed. Reassurance provided   Anxiety Followed by primary care  Frequent falls Walking with a walker, denies any recent falls  Diabetes type 2 Recent weight gain, weight trending upwards, poor diet Discussed the importance of strict diet control, low carbohydrates   Total encounter time more than 25 minutes   Greater than 50% was spent in counseling and coordination of care with the patient     Orders Placed This  Encounter  Procedures  . EKG 12-Lead     Signed, Dossie Arbour, M.D., Ph.D. 08/24/2017  [facility] [place], [place] [phone]

## 2017-08-24 ENCOUNTER — Ambulatory Visit (INDEPENDENT_AMBULATORY_CARE_PROVIDER_SITE_OTHER): Payer: PPO | Admitting: Cardiovascular Disease

## 2017-08-24 ENCOUNTER — Encounter: Payer: Self-pay | Admitting: Cardiovascular Disease

## 2017-08-24 VITALS — BP 139/76 | HR 74 | Ht 67.0 in | Wt 196.2 lb

## 2017-08-24 DIAGNOSIS — I208 Other forms of angina pectoris: Secondary | ICD-10-CM | POA: Diagnosis not present

## 2017-08-24 DIAGNOSIS — I25118 Atherosclerotic heart disease of native coronary artery with other forms of angina pectoris: Secondary | ICD-10-CM | POA: Diagnosis not present

## 2017-08-24 DIAGNOSIS — G4733 Obstructive sleep apnea (adult) (pediatric): Secondary | ICD-10-CM

## 2017-08-24 DIAGNOSIS — I259 Chronic ischemic heart disease, unspecified: Secondary | ICD-10-CM | POA: Diagnosis not present

## 2017-08-24 DIAGNOSIS — E1159 Type 2 diabetes mellitus with other circulatory complications: Secondary | ICD-10-CM

## 2017-08-24 DIAGNOSIS — I2089 Other forms of angina pectoris: Secondary | ICD-10-CM

## 2017-08-24 DIAGNOSIS — M869 Osteomyelitis, unspecified: Secondary | ICD-10-CM | POA: Diagnosis not present

## 2017-08-24 DIAGNOSIS — E1169 Type 2 diabetes mellitus with other specified complication: Secondary | ICD-10-CM

## 2017-08-24 DIAGNOSIS — F419 Anxiety disorder, unspecified: Secondary | ICD-10-CM | POA: Diagnosis not present

## 2017-08-24 DIAGNOSIS — E1165 Type 2 diabetes mellitus with hyperglycemia: Secondary | ICD-10-CM

## 2017-08-24 DIAGNOSIS — IMO0002 Reserved for concepts with insufficient information to code with codable children: Secondary | ICD-10-CM

## 2017-08-24 NOTE — Patient Instructions (Addendum)

## 2017-08-26 ENCOUNTER — Encounter: Payer: PPO | Attending: Surgery | Admitting: Surgery

## 2017-08-26 DIAGNOSIS — M1990 Unspecified osteoarthritis, unspecified site: Secondary | ICD-10-CM | POA: Insufficient documentation

## 2017-08-26 DIAGNOSIS — E1140 Type 2 diabetes mellitus with diabetic neuropathy, unspecified: Secondary | ICD-10-CM | POA: Diagnosis not present

## 2017-08-26 DIAGNOSIS — F419 Anxiety disorder, unspecified: Secondary | ICD-10-CM | POA: Insufficient documentation

## 2017-08-26 DIAGNOSIS — L97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 layer exposed: Secondary | ICD-10-CM | POA: Diagnosis not present

## 2017-08-26 DIAGNOSIS — L03115 Cellulitis of right lower limb: Secondary | ICD-10-CM | POA: Diagnosis not present

## 2017-08-26 DIAGNOSIS — G4730 Sleep apnea, unspecified: Secondary | ICD-10-CM | POA: Insufficient documentation

## 2017-08-26 DIAGNOSIS — M549 Dorsalgia, unspecified: Secondary | ICD-10-CM | POA: Diagnosis not present

## 2017-08-26 DIAGNOSIS — L97329 Non-pressure chronic ulcer of left ankle with unspecified severity: Secondary | ICD-10-CM | POA: Diagnosis not present

## 2017-08-26 DIAGNOSIS — Z794 Long term (current) use of insulin: Secondary | ICD-10-CM | POA: Diagnosis not present

## 2017-08-26 DIAGNOSIS — E11621 Type 2 diabetes mellitus with foot ulcer: Secondary | ICD-10-CM | POA: Insufficient documentation

## 2017-08-26 DIAGNOSIS — Z881 Allergy status to other antibiotic agents status: Secondary | ICD-10-CM | POA: Diagnosis not present

## 2017-08-26 DIAGNOSIS — Z885 Allergy status to narcotic agent status: Secondary | ICD-10-CM | POA: Insufficient documentation

## 2017-08-26 DIAGNOSIS — G8929 Other chronic pain: Secondary | ICD-10-CM | POA: Insufficient documentation

## 2017-08-26 DIAGNOSIS — L97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 left ankle with fat layer exposed: Secondary | ICD-10-CM | POA: Insufficient documentation

## 2017-08-26 DIAGNOSIS — Z888 Allergy status to other drugs, medicaments and biological substances status: Secondary | ICD-10-CM | POA: Diagnosis not present

## 2017-08-26 DIAGNOSIS — I2510 Atherosclerotic heart disease of native coronary artery without angina pectoris: Secondary | ICD-10-CM | POA: Diagnosis not present

## 2017-08-26 DIAGNOSIS — I10 Essential (primary) hypertension: Secondary | ICD-10-CM | POA: Insufficient documentation

## 2017-08-26 DIAGNOSIS — Z89422 Acquired absence of other left toe(s): Secondary | ICD-10-CM | POA: Insufficient documentation

## 2017-08-26 DIAGNOSIS — Z79899 Other long term (current) drug therapy: Secondary | ICD-10-CM | POA: Diagnosis not present

## 2017-08-26 DIAGNOSIS — Z7982 Long term (current) use of aspirin: Secondary | ICD-10-CM | POA: Diagnosis not present

## 2017-08-26 DIAGNOSIS — E11622 Type 2 diabetes mellitus with other skin ulcer: Secondary | ICD-10-CM | POA: Diagnosis not present

## 2017-08-26 DIAGNOSIS — L97319 Non-pressure chronic ulcer of right ankle with unspecified severity: Secondary | ICD-10-CM | POA: Diagnosis not present

## 2017-08-27 DIAGNOSIS — L97509 Non-pressure chronic ulcer of other part of unspecified foot with unspecified severity: Secondary | ICD-10-CM | POA: Diagnosis not present

## 2017-08-28 NOTE — Progress Notes (Signed)
AMARRAH, MEINHART ([id number]) Visit Report for 08/26/2017 Arrival Information Details Patient Name: Jessica Donovan, Jessica Donovan Date of Service: 08/26/2017 3:30 PM Medical Record Number: [id number] Patient Account Number: [id number] Date of Birth/Sex: 10-Feb-1946 (71 y.o. Female) Treating RN: Ashok Cordia, Debi Primary Care Aylanie Cubillos: Garlon Hatchet Other Clinician: Referring Janaia Kozel: Garlon Hatchet Treating Keyle Doby/Extender: Rudene Re in Treatment: 7 Visit Information History Since Last Visit All ordered tests and consults were completed: No Patient Arrived: Dan Humphreys Added or deleted any medications: No Arrival Time: 15:17 Any new allergies or adverse reactions: No Accompanied By: self Had a fall or experienced change in No Transfer Assistance: None activities of daily living that may affect Patient Identification Verified: Yes risk of falls: Secondary Verification Process Yes Signs or symptoms of abuse/neglect since last No Completed: visito Patient Requires Transmission- No Hospitalized since last visit: No Based Precautions: Has Dressing in Place as Prescribed: Yes Patient Has Alerts: Yes Pain Present Now: Yes Patient Alerts: Patient on Blood Thinner ASA Electronic Signature(s) Signed: 08/26/2017 4:45:09 PM By: Alejandro Mulling Entered By: Alejandro Mulling on 08/26/2017 15:25:18 Jessica Donovan ([id number]) -------------------------------------------------------------------------------- Clinic Level of Care Assessment Details Patient Name: Jessica Donovan Date of Service: 08/26/2017 3:30 PM Medical Record Number: [id number] Patient Account Number: [id number] Date of Birth/Sex: [birth year] (71 y.o. Female) Treating RN: Ashok Cordia, Debi Primary Care Ramy Greth: Garlon Hatchet Other Clinician: Referring Arrin Ishler: Garlon Hatchet Treating Reed Dady/Extender: Rudene Re in Treatment: 7 Clinic Level of Care Assessment Items TOOL 4 Quantity Score X - Use when only an  EandM is performed on FOLLOW-UP visit 1 0 ASSESSMENTS - Nursing Assessment / Reassessment X - Reassessment of Co-morbidities (includes updates in patient status) 1 10 X - Reassessment of Adherence to Treatment Plan 1 5 ASSESSMENTS - Wound and Skin Assessment / Reassessment  - Simple Wound Assessment / Reassessment - one wound 0 X - Complex Wound Assessment / Reassessment - multiple wounds 1 5  - Dermatologic / Skin Assessment (not related to wound area) 0 ASSESSMENTS - Focused Assessment  - Circumferential Edema Measurements - multi extremities 0  - Nutritional Assessment / Counseling / Intervention 0  - Lower Extremity Assessment (monofilament, tuning fork, pulses) 0  - Peripheral Arterial Disease Assessment (using hand held doppler) 0 ASSESSMENTS - Ostomy and/or Continence Assessment and Care  - Incontinence Assessment and Management 0  - Ostomy Care Assessment and Management (repouching, etc.) 0 PROCESS - Coordination of Care X - Simple Patient / Family Education for ongoing care 1 15  - Complex (extensive) Patient / Family Education for ongoing care 0  - Staff obtains Chiropractor, Records, Test Results / Process Orders 0  - Staff telephones HHA, Nursing Homes / Clarify orders / etc 0  - Routine Transfer to another Facility (non-emergent condition) 0 Hanlon, Jessica Donovan ([id number])  - Routine Hospital Admission (non-emergent condition) 0  - New Admissions / Manufacturing engineer / Ordering NPWT, Apligraf, etc. 0  - Emergency Hospital Admission (emergent condition) 0 X - Simple Discharge Coordination 1 10  - Complex (extensive) Discharge Coordination 0 PROCESS - Special Needs  - Pediatric / Minor Patient Management 0  - Isolation Patient Management 0  - Hearing / Language / Visual special needs 0  - Assessment of Community assistance (transportation, D/C planning, etc.) 0  - Additional assistance / Altered mentation 0  - Support Surface(s)  Assessment (bed, cushion, seat, etc.) 0 INTERVENTIONS - Wound Cleansing / Measurement  - Simple Wound Cleansing - one wound 0 X - Complex Wound Cleansing - multiple wounds 2 5 X - Wound  Imaging (photographs - any number of wounds) 1 5 []  - Wound Tracing (instead of photographs) 0 X - Simple Wound Measurement - one wound 1 5 []  - Complex Wound Measurement - multiple wounds 0 INTERVENTIONS - Wound Dressings X - Small Wound Dressing one or multiple wounds 1 10 []  - Medium Wound Dressing one or multiple wounds 0 []  - Large Wound Dressing one or multiple wounds 0 []  - Application of Medications - topical 0 []  - Application of Medications - injection 0 INTERVENTIONS - Miscellaneous []  - External ear exam 0 Yutzy, Jessica Donovan ([id number]) []  - Specimen Collection (cultures, biopsies, blood, body fluids, etc.) 0 []  - Specimen(s) / Culture(s) sent or taken to Lab for analysis 0 []  - Patient Transfer (multiple staff / Michiel SitesHoyer Lift / Similar devices) 0 []  - Simple Staple / Suture removal (25 or less) 0 []  - Complex Staple / Suture removal (26 or more) 0 []  - Hypo / Hyperglycemic Management (close monitor of Blood Glucose) 0 []  - Ankle / Brachial Index (ABI) - do not check if billed separately 0 X - Vital Signs 1 5 Has the patient been seen at the hospital within the last three years: Yes Total Score: 80 Level Of Care: New/Established - Level 3 Electronic Signature(s) Signed: 08/26/2017 4:45:09 PM By: Alejandro MullingPinkerton, Debra Entered By: Alejandro MullingPinkerton, Debra on 08/26/2017 16:07:33 Rittenberry, [name] ([id number]) -------------------------------------------------------------------------------- Encounter Discharge Information Details Patient Name: Jessica DanceGLASS, Jessica Donovan Date of Service: 08/26/2017 3:30 PM Medical Record Number: [id number] Patient Account Number: [id number] Date of Birth/Sex: [birth year](71 y.o. Female) Treating RN: Ashok CordiaPinkerton, Debi Primary Care Yamile Roedl: Garlon HatchetMCCONVILLE, ROBERT Other Clinician: Referring Mieczyslaw Stamas:  Garlon HatchetMCCONVILLE, ROBERT Treating Librada Castronovo/Extender: Rudene ReBritto, Errol Weeks in Treatment: 7 Encounter Discharge Information Items Discharge Pain Level: 0 Discharge Condition: Stable Ambulatory Status: Walker Discharge Destination: Home Transportation: Other Accompanied By: self Schedule Follow-up Appointment: Yes Medication Reconciliation completed No and provided to Patient/Care Saban Heinlen: Provided on Clinical Summary of Care: 08/26/2017 Form Type Recipient Paper Patient BG Electronic Signature(s) Signed: 08/27/2017 10:11:21 AM By: Gwenlyn PerkingMoore, Shelia Entered By: Gwenlyn PerkingMoore, Shelia on 08/26/2017 16:00:37 Jessica DanceGLASS, Jessica Donovan ([id number]) -------------------------------------------------------------------------------- Lower Extremity Assessment Details Patient Name: Jessica DanceGLASS, Malania Date of Service: 08/26/2017 3:30 PM Medical Record Number: [id number] Patient Account Number: [id number] Date of Birth/Sex: [birth year] (71 y.o. Female) Treating RN: Ashok CordiaPinkerton, Debi Primary Care Venna Berberich: Garlon HatchetMCCONVILLE, ROBERT Other Clinician: Referring Sevana Grandinetti: Garlon HatchetMCCONVILLE, ROBERT Treating Lilybelle Mayeda/Extender: Rudene ReBritto, Errol Weeks in Treatment: 7 Vascular Assessment Pulses: Dorsalis Pedis Palpable: [Left:Yes] [Right:Yes] Posterior Tibial Extremity colors, hair growth, and conditions: Extremity Color: [Left:Normal] [Right:Normal] Temperature of Extremity: [Left:Warm] [Right:Warm] Capillary Refill: [Left:< 3 seconds] Toe Nail Assessment Left: Right: Thick: No No Discolored: No No Deformed: No No Improper Length and Hygiene: No No Electronic Signature(s) Signed: 08/26/2017 4:45:09 PM By: Alejandro MullingPinkerton, Debra Entered By: Alejandro MullingPinkerton, Debra on 08/26/2017 15:33:12 Appleby, [name] ([id number]) -------------------------------------------------------------------------------- Multi Wound Chart Details Patient Name: Jessica DanceGLASS, Averill Date of Service: 08/26/2017 3:30 PM Medical Record Number: [id number] Patient Account Number: [id number] Date of  Birth/Sex: [birth year] (71 y.o. Female) Treating RN: Ashok CordiaPinkerton, Debi Primary Care Senaida Chilcote: Garlon HatchetMCCONVILLE, ROBERT Other Clinician: Referring Temara Lanum: Garlon HatchetMCCONVILLE, ROBERT Treating Kealii Thueson/Extender: Rudene ReBritto, Errol Weeks in Treatment: 7 Vital Signs Height(in): 66 Pulse(bpm): 72 Weight(lbs): 191 Blood Pressure 132/63 (mmHg): Body Mass Index(BMI): 31 Temperature(F): 98.4 Respiratory Rate 18 (breaths/min): Photos: [1:No Photos] [2:No Photos] [N/A:N/A] Wound Location: [1:Left Malleolus] [2:Right Malleolus] [N/A:N/A] Wounding Event: [1:Gradually Appeared] [2:Gradually Appeared] [N/A:N/A] Primary Etiology: [1:Diabetic Wound/Ulcer of the Lower Extremity] [2:Diabetic Wound/Ulcer of the Lower Extremity] [N/A:N/A] Comorbid History: [1:Sleep Apnea, Coronary Artery Disease, Hypertension, Myocardial Infarction, Type II  Diabetes, History of pressure wounds, Osteoarthritis, Neuropathy, Confinement Anxiety] [2:Sleep Apnea, Coronary Artery Disease, Hypertension,  Myocardial Infarction, Type II Diabetes, History of pressure wounds, Osteoarthritis, Neuropathy, Confinement Anxiety] [N/A:N/A] Date Acquired: [1:02/17/2017] [2:02/17/2017] [N/A:N/A] Weeks of Treatment: [1:7] [2:7] [N/A:N/A] Wound Status: [1:Open] [2:Open] [N/A:N/A] Measurements L x W x D 0x0x0 [2:0.7x0.8x0.2] [N/A:N/A] (cm) Area (cm) : [1:0] [2:0.44] [N/A:N/A] Volume (cm) : [1:0] [2:0.088] [N/A:N/A] % Reduction in Area: [1:100.00%] [2:64.10%] [N/A:N/A] % Reduction in Volume: 100.00% [2:28.50%] [N/A:N/A] Classification: [1:Grade 1] [2:Grade 1] [N/A:N/A] Exudate Amount: [1:None Present] [2:Large] [N/A:N/A] Exudate Type: [1:N/A] [2:Serous] [N/A:N/A] Exudate Color: [1:N/A] [2:amber] [N/A:N/A] Wound Margin: [1:Flat and Intact] [2:Flat and Intact] [N/A:N/A] Granulation Amount: [1:None Present (0%)] [2:None Present (0%)] [N/A:N/A] Necrotic Amount: [1:None Present (0%)] [2:Large (67-100%)] [N/A:N/A] Epithelialization: Large (67-100%) None  N/A Periwound Skin Texture: No Abnormalities Noted No Abnormalities Noted N/A Periwound Skin No Abnormalities Noted No Abnormalities Noted N/A Moisture: Periwound Skin Color: No Abnormalities Noted Erythema: Yes N/A Jessica Donovan: Yes Erythema Location: N/A Circumferential N/A Temperature: No Abnormality No Abnormality N/A Tenderness on No Yes N/A Palpation: Wound Preparation: Ulcer Cleansing: Ulcer Cleansing: N/A Rinsed/Irrigated with Rinsed/Irrigated with Saline Saline Topical Anesthetic Topical Anesthetic Applied: None Applied: Other: lidocaine 4% Treatment Notes Wound #2 (Right Malleolus) 1. Cleansed with: Clean wound with Normal Saline 2. Anesthetic Topical Lidocaine 4% cream to wound bed prior to debridement 3. Peri-wound Care: Antifungal cream Skin Prep 4. Dressing Applied: Santyl Ointment 5. Secondary Dressing Applied Bordered Foam Dressing Dry Gauze Electronic Signature(s) Signed: 08/26/2017 4:09:31 PM By: Evlyn Kanner MD, FACS Entered By: Evlyn Kanner on 08/26/2017 16:03:01 Jessica Donovan ([id number]) -------------------------------------------------------------------------------- Multi-Disciplinary Care Plan Details Patient Name: Jessica Donovan Date of Service: 08/26/2017 3:30 PM Medical Record Number: [id number] Patient Account Number: [id number] Date of Birth/Sex: Dec 16, 1946 (71 y.o. Female) Treating RN: Ashok Cordia, Debi Primary Care Maguire Killmer: Garlon Hatchet Other Clinician: Referring Korene Dula: Garlon Hatchet Treating Lesle Faron/Extender: Rudene Re in Treatment: 7 Active Inactive ` Orientation to the Wound Care Program Nursing Diagnoses: Knowledge deficit related to the wound healing center program Goals: Patient/caregiver will verbalize understanding of the Wound Healing Center Program Date Initiated: 07/08/2017 Target Resolution Date: 10/08/2017 Goal Status: Active Interventions: Provide education on orientation to the wound  center Notes: ` Peripheral Neuropathy Nursing Diagnoses: Knowledge deficit related to disease process and management of peripheral neurovascular dysfunction Potential alteration in peripheral tissue perfusion (select prior to confirmation of diagnosis) Goals: Patient/caregiver will verbalize understanding of disease process and disease management Date Initiated: 07/08/2017 Target Resolution Date: 10/08/2017 Goal Status: Active Interventions: Assess signs and symptoms of neuropathy upon admission and as needed Provide education on Management of Neuropathy and Related Ulcers Provide education on Management of Neuropathy upon discharge from the Wound Center Treatment Activities: Patient referred for customized footwear/offloading : 07/08/2017 Patient referred to diabetes educator : 07/08/2017 Jessica Donovan ([id number]) Notes: ` Wound/Skin Impairment Nursing Diagnoses: Impaired tissue integrity Knowledge deficit related to ulceration/compromised skin integrity Goals: Patient/caregiver will verbalize understanding of skin care regimen Date Initiated: 07/08/2017 Target Resolution Date: 10/08/2017 Goal Status: Active Ulcer/skin breakdown will have a volume reduction of 30% by week 4 Date Initiated: 07/08/2017 Target Resolution Date: 10/08/2017 Goal Status: Active Ulcer/skin breakdown will have a volume reduction of 50% by week 8 Date Initiated: 07/08/2017 Target Resolution Date: 10/08/2017 Goal Status: Active Ulcer/skin breakdown will have a volume reduction of 80% by week 12 Date Initiated: 07/08/2017 Target Resolution Date: 10/08/2017 Goal Status: Active Ulcer/skin breakdown will heal within 14 weeks Date Initiated: 07/08/2017 Target Resolution Date: 10/08/2017 Goal Status: Active  Interventions: Assess patient/caregiver ability to obtain necessary supplies Assess patient/caregiver ability to perform ulcer/skin care regimen upon admission and as needed Assess ulceration(s) every  visit Treatment Activities: Referred to DME Adina Puzzo for dressing supplies : 07/08/2017 Skin care regimen initiated : 07/08/2017 Topical wound management initiated : 07/08/2017 Notes: Electronic Signature(s) Signed: 08/26/2017 4:45:09 PM By: Alejandro Mulling Entered By: Alejandro Mulling on 08/26/2017 15:34:27 Gucciardo, Jessica Donovan ([id number]) -------------------------------------------------------------------------------- Pain Assessment Details Patient Name: Jessica Donovan Date of Service: 08/26/2017 3:30 PM Medical Record Number: [id number] Patient Account Number: [id number] Date of Birth/Sex: 03/09/1946 (71 y.o. Female) Treating RN: Ashok Cordia, Debi Primary Care Tamina Cyphers: Garlon Hatchet Other Clinician: Referring Tellis Spivak: Garlon Hatchet Treating Carnesha Maravilla/Extender: Rudene Re in Treatment: 7 Active Problems Location of Pain Severity and Description of Pain Patient Has Paino Yes Site Locations Pain Location: Pain in Ulcers Rate the pain. Current Pain Level: 5 Character of Pain Describe the Pain: Aching, Tender, Throbbing Pain Management and Medication Current Pain Management: Electronic Signature(s) Signed: 08/26/2017 4:45:09 PM By: Alejandro Mulling Entered By: Alejandro Mulling on 08/26/2017 15:25:06 Jessica Donovan ([id number]) -------------------------------------------------------------------------------- Patient/Caregiver Education Details Patient Name: Jessica Donovan Date of Service: 08/26/2017 3:30 PM Medical Record Number: [id number] Patient Account Number: [id number] Date of Birth/Gender: 11-04-1946 (71 y.o. Female) Treating RN: Phillis Haggis Primary Care Physician: Garlon Hatchet Other Clinician: Referring Physician: Garlon Hatchet Treating Physician/Extender: Rudene Re in Treatment: 7 Education Assessment Education Provided To: Patient Education Topics Provided Wound/Skin Impairment: Handouts: Other: change dressing as ordered Methods:  Demonstration, Explain/Verbal Responses: State content correctly Electronic Signature(s) Signed: 08/26/2017 4:45:09 PM By: Alejandro Mulling Entered By: Alejandro Mulling on 08/26/2017 15:35:05 Zurcher, Jessica Donovan ([id number]) -------------------------------------------------------------------------------- Wound Assessment Details Patient Name: Jessica Donovan Date of Service: 08/26/2017 3:30 PM Medical Record Number: [id number] Patient Account Number: [id number] Date of Birth/Sex: Jun 25, 1946 (71 y.o. Female) Treating RN: Ashok Cordia, Debi Primary Care Giuliana Handyside: Garlon Hatchet Other Clinician: Referring Rica Heather: Garlon Hatchet Treating Gabriel Paulding/Extender: Rudene Re in Treatment: 7 Wound Status Wound Number: 1 Primary Diabetic Wound/Ulcer of the Lower Etiology: Extremity Wound Location: Left Malleolus Wound Open Wounding Event: Gradually Appeared Status: Date Acquired: 02/17/2017 Comorbid Sleep Apnea, Coronary Artery Disease, Weeks Of Treatment: 7 History: Hypertension, Myocardial Infarction, Clustered Wound: No Type II Diabetes, History of pressure wounds, Osteoarthritis, Neuropathy, Confinement Anxiety Photos Photo Uploaded By: Alejandro Mulling on 08/26/2017 16:15:23 Wound Measurements Length: (cm) 0 % Reduction i Width: (cm) 0 % Reduction i Depth: (cm) 0 Epithelializa Area: (cm) 0 Tunneling: Volume: (cm) 0 Undermining: n Area: 100% n Volume: 100% tion: Large (67-100%) No No Wound Description Classification: Grade 1 Foul Odor Aft Wound Margin: Flat and Intact Slough/Fibrin Exudate Amount: None Present er Cleansing: No o No Wound Bed Granulation Amount: None Present (0%) Necrotic Amount: None Present (0%) Periwound Skin Texture Danley, Jessica Donovan ([id number]) Texture Color No Abnormalities Noted: No No Abnormalities Noted: No Moisture Temperature / Pain No Abnormalities Noted: No Temperature: No Abnormality Wound Preparation Ulcer Cleansing:  Rinsed/Irrigated with Saline Topical Anesthetic Applied: None Electronic Signature(s) Signed: 08/26/2017 4:45:09 PM By: Alejandro Mulling Entered By: Alejandro Mulling on 08/26/2017 15:46:15 [place], Jessica Donovan ([id number]) -------------------------------------------------------------------------------- Wound Assessment Details Patient Name: Jessica Donovan Date of Service: 08/26/2017 3:30 PM Medical Record Number: [id number] Patient Account Number: [id number] Date of Birth/Sex: April 09, 1946 (71 y.o. Female) Treating RN: Ashok Cordia, Debi Primary Care Kaleeyah Cuffie: Garlon Hatchet Other Clinician: Referring Aljean Horiuchi: Garlon Hatchet Treating Giovani Neumeister/Extender: Rudene Re in Treatment: 7 Wound Status Wound Number: 2 Primary Diabetic Wound/Ulcer of the Lower Etiology: Extremity Wound Location: Right Malleolus Wound Open Wounding Event: Gradually  Appeared Status: Date Acquired: 02/17/2017 Comorbid Sleep Apnea, Coronary Artery Disease, Weeks Of Treatment: 7 History: Hypertension, Myocardial Infarction, Clustered Wound: No Type II Diabetes, History of pressure wounds, Osteoarthritis, Neuropathy, Confinement Anxiety Photos Photo Uploaded By: Alejandro Mulling on 08/26/2017 16:15:23 Wound Measurements Length: (cm) 0.7 Width: (cm) 0.8 Depth: (cm) 0.2 Area: (cm) 0.44 Volume: (cm) 0.088 % Reduction in Area: 64.1% % Reduction in Volume: 28.5% Epithelialization: None Tunneling: No Undermining: No Wound Description Classification: Grade 1 Wound Margin: Flat and Intact Exudate Amount: Large Exudate Type: Serous Exudate Color: amber Foul Odor After Cleansing: No Slough/Fibrino Yes Wound Bed Granulation Amount: None Present (0%) Necrotic Amount: Large (67-100%) Watterson, Jessica Donovan ([id number]) Necrotic Quality: Adherent Slough Periwound Skin Texture Texture Color No Abnormalities Noted: No No Abnormalities Noted: No Erythema: Yes Moisture Erythema Location: Circumferential No  Abnormalities Noted: No Jessica Donovan: Yes Temperature / Pain Temperature: No Abnormality Tenderness on Palpation: Yes Wound Preparation Ulcer Cleansing: Rinsed/Irrigated with Saline Topical Anesthetic Applied: Other: lidocaine 4%, Treatment Notes Wound #2 (Right Malleolus) 1. Cleansed with: Clean wound with Normal Saline 2. Anesthetic Topical Lidocaine 4% cream to wound bed prior to debridement 3. Peri-wound Care: Antifungal cream Skin Prep 4. Dressing Applied: Santyl Ointment 5. Secondary Dressing Applied Bordered Foam Dressing Dry Gauze Electronic Signature(s) Signed: 08/26/2017 4:45:09 PM By: Alejandro Mulling Entered By: Alejandro Mulling on 08/26/2017 15:27:52 [place], Jessica Donovan ([id number]) -------------------------------------------------------------------------------- Vitals Details Patient Name: Jessica Donovan Date of Service: 08/26/2017 3:30 PM Medical Record Number: [id number] Patient Account Number: [id number] Date of Birth/Sex: 04/12/1946 (71 y.o. Female) Treating RN: Ashok Cordia, Debi Primary Care Clemie General: Garlon Hatchet Other Clinician: Referring Sonia Stickels: Garlon Hatchet Treating Rubens Cranston/Extender: Rudene Re in Treatment: 7 Vital Signs Time Taken: 15:31 Temperature (F): 98.4 Height (in): 66 Pulse (bpm): 72 Weight (lbs): 191 Respiratory Rate (breaths/min): 18 Body Mass Index (BMI): 30.8 Blood Pressure (mmHg): 132/63 Reference Range: 80 - 120 mg / dl Electronic Signature(s) Signed: 08/26/2017 4:45:09 PM By: Alejandro Mulling Entered By: Alejandro Mulling on 08/26/2017 15:32:05

## 2017-08-29 NOTE — Progress Notes (Signed)
ORIEL, OJO ([id number]) Visit Report for 08/26/2017 Chief Complaint Document Details Patient Name: Jessica Donovan, Jessica Donovan Date of Service: 08/26/2017 3:30 PM Medical Record Number: [id number] Patient Account Number: [id number] Date of Birth/Sex: [birth year] (71 y.o. Female) Treating RN: Phillis Haggis Primary Care Provider: Garlon Hatchet Other Clinician: Referring Provider: Garlon Hatchet Treating Provider/Extender: Rudene Re in Treatment: 7 Information Obtained from: Patient Chief Complaint Patients presents for treatment of an open diabetic ulcer to the right ankle the left ankle and the right third toe Electronic Signature(s) Signed: 08/26/2017 4:09:31 PM By: Evlyn Kanner MD, FACS Entered By: Evlyn Kanner on 08/26/2017 16:03:13 Jessica Donovan ([id number]) -------------------------------------------------------------------------------- HPI Details Patient Name: Jessica Donovan Date of Service: 08/26/2017 3:30 PM Medical Record Number: [id number] Patient Account Number: [id number] Date of Birth/Sex: 08/22/1946 (71 y.o. Female) Treating RN: Phillis Haggis Primary Care Provider: Garlon Hatchet Other Clinician: Referring Provider: Garlon Hatchet Treating Provider/Extender: Rudene Re in Treatment: 7 History of Present Illness HPI Description: 71 year old diabetic patient who has chronic back pain, collagen vascular disease, coronary artery disease, hypertension and history of osteomyelitis of the toe in February 2017. 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 She also has had joint replacement surgery in the past. She was recently reviewed by Primary care management team and had a problem with the toe on her right foot and her left ankle had minimal drainage, and the right ankle was draining worse. She was seen in the ER on 06/22/2017, and on the x-ray, there was no evidence of any acute bony abnormality. 07/22/2017 -- she says  she had a mild abrasion to her right second toe which was inadvertent but there is no open ulceration. 07/30/17 on evaluation today patient appears to be doing better in regard to her abrasion to the right second toe. It appears there's little bit of a pressure injury although there is no opening at this point. I think this is pushing up on her shoe which is causing the issue at this point and that's due to the contraction that happening with her toe. With that being said patient wonders if there's anything that can be done in that regard for this. She has been tolerating the dressing changes without any complication and is having really no pain. Her right lateral heel wound appears to be slough covered though there is no evidence of infection. No fevers, chills, nausea, or vomiting noted at this time. 08/09/2017 -- since last week her leg has gotten quite red and painful and she was concerned about the possibility of infection Electronic Signature(s) Signed: 08/26/2017 4:09:31 PM By: Evlyn Kanner MD, FACS Entered By: Evlyn Kanner on 08/26/2017 16:03:18 Jessica Donovan ([id number]) -------------------------------------------------------------------------------- Physical Exam Details Patient Name: Jessica Donovan Date of Service: 08/26/2017 3:30 PM Medical Record Number: [id number] Patient Account Number: [id number] Date of Birth/Sex: 10-05-1946 (71 y.o. Female) Treating RN: Phillis Haggis Primary Care Provider: Garlon Hatchet Other Clinician: Referring Provider: Garlon Hatchet Treating Provider/Extender: Rudene Re in Treatment: 7 Constitutional . Pulse regular. Respirations normal and unlabored. Afebrile. . Eyes Nonicteric. Reactive to light. Ears, Nose, Mouth, and Throat Lips, teeth, and gums WNL.[name] Moist mucosa without lesions. Neck supple and nontender. No palpable supraclavicular or cervical adenopathy. Normal sized without goiter. Respiratory WNL. No  retractions.. Cardiovascular Pedal Pulses WNL. No clubbing, cyanosis or edema. Lymphatic No adneopathy. No adenopathy. No adenopathy. Musculoskeletal Adexa without tenderness or enlargement.. Digits and nails w/o clubbing, cyanosis, infection, petechiae, ischemia, or inflammatory conditions.. Integumentary (Hair, Skin) No suspicious  lesions. No crepitus or fluctuance. No peri-wound warmth or erythema. No masses.[name]. Psychiatric Judgement and insight Intact.. No evidence of depression, anxiety, or agitation.. Notes the left ankle wound is completely healed. The right ankle requires some more central ointment after had washed off the debris with a moist saline gauze. No sharp debridement was required today. she may have an element of fungal infection around the wound and I have asked her to apply some antifungal cream daily. Electronic Signature(s) Signed: 08/26/2017 4:09:31 PM By: Evlyn KannerBritto, Priya Matsen MD, FACS Entered By: Evlyn KannerBritto, Jaydon Soroka on 08/26/2017 16:04:13 Jessica Donovan, Jessica ([id number]) -------------------------------------------------------------------------------- Physician Orders Details Patient Name: Jessica Donovan, Jessica Donovan Date of Service: 08/26/2017 3:30 PM Medical Record Number: [id number] Patient Account Number: [id number] Date of Birth/Sex: [birth year](71 y.o. Female) Treating RN: Phillis HaggisPinkerton, Debi Primary Care Provider: Garlon HatchetMCCONVILLE, ROBERT Other Clinician: Referring Provider: Garlon HatchetMCCONVILLE, ROBERT Treating Provider/Extender: Rudene ReBritto, Nilay Mangrum Weeks in Treatment: 7 Verbal / Phone Orders: No Diagnosis Coding Wound Cleansing Wound #2 Right Malleolus o Cleanse wound with mild soap and water o May Shower, gently pat wound dry prior to applying new dressing. o May shower with protection. Anesthetic Wound #2 Right Malleolus o Topical Lidocaine 4% cream applied to wound bed prior to debridement Skin Barriers/Peri-Wound Care Wound #2 Right Malleolus o Skin Prep o Antifungal cream Primary Wound  Dressing Wound #2 Right Malleolus o Santyl Ointment Secondary Dressing Wound #2 Right Malleolus o Dry Gauze o Conform/Kerlix Dressing Change Frequency Wound #2 Right Malleolus o Change dressing every day. Follow-up Appointments Wound #2 Right Malleolus o Return Appointment in 1 week. Edema Control Wound #2 Right Malleolus o Elevate legs to the level of the heart and pump ankles as often as possible Donovan, Jessica ([id number]) Additional Orders / Instructions Wound #2 Right Malleolus o Increase protein intake. o Activity as tolerated Electronic Signature(s) Signed: 08/26/2017 4:09:31 PM By: Evlyn KannerBritto, Narayan Scull MD, FACS Signed: 08/26/2017 4:45:09 PM By: Alejandro MullingPinkerton, Debra Entered By: Alejandro MullingPinkerton, Debra on 08/26/2017 16:08:30 Sivley, [name] ([id number]) -------------------------------------------------------------------------------- Problem List Details Patient Name: Jessica Donovan, Shamir Date of Service: 08/26/2017 3:30 PM Medical Record Number: [id number] Patient Account Number: [id number] Date of Birth/Sex: [birth year] (71 y.o. 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 07/08/2017 Yes exposed L03.115 Cellulitis of right lower limb 08/09/2017 Yes Inactive Problems Resolved Problems Electronic Signature(s) Signed: 08/26/2017 4:09:31 PM By: Evlyn KannerBritto, Anyelo Mccue MD, FACS Entered By: Evlyn KannerBritto, Bradey Luzier on 08/26/2017 16:02:56 Jessica Donovan, Jessica ([id number]) -------------------------------------------------------------------------------- Progress Note  Details Patient Name: Jessica Donovan, Jessica Donovan Date of Service: 08/26/2017 3:30 PM Medical Record Number: [id number] Patient Account Number: [id number] Date of Birth/Sex: [birth year] [age] y.o. Female) Treating RN: Ashok CordiaPinkerton, Debi Primary Care Provider: Garlon HatchetMCCONVILLE, ROBERT Other Clinician: Referring Provider: Garlon HatchetMCCONVILLE, ROBERT Treating Provider/Extender: Rudene ReBritto, Alexy Bringle Weeks in Treatment: 7 Subjective Chief Complaint Information obtained from Patient Patients presents for treatment of an open diabetic ulcer to the right ankle the left ankle and the right third toe History of Present Illness (HPI) 71 year old diabetic patient who has chronic back pain, collagen vascular disease, coronary artery disease, hypertension and history of osteomyelitis of the toe in February 2017. 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 She also has had joint replacement surgery in the past.  She was recently reviewed by Primary care management team and had a problem with the toe on her right foot and her left ankle had minimal drainage, and the right ankle was draining worse. She was seen in the ER on 06/22/2017, and on the x-ray, there was no evidence of any acute bony abnormality. 07/22/2017 -- she says she had a mild abrasion to her right second toe which was inadvertent but there is no open ulceration. 07/30/17 on evaluation today patient appears to be doing better in regard to her abrasion to the right second toe. It appears there's little bit of a pressure injury although there is no opening at this point. I think this is pushing up on her shoe which is causing the issue at this point and that's due to the contraction that happening with her toe. With that being said patient wonders if there's anything that can be done in that regard for this. She has been tolerating the dressing changes without any complication and is having really no pain. Her right lateral heel wound appears to be slough  covered though there is no evidence of infection. No fevers, chills, nausea, or vomiting noted at this time. 08/09/2017 -- since last week her leg has gotten quite red and painful and she was concerned about the possibility of infection Objective Constitutional Pulse regular. Respirations normal and unlabored. Afebrile. Klecker, Jessica Donovan ([id number]) Vitals Time Taken: 3:31 PM, Height: 66 in, Weight: 191 lbs, BMI: 30.8, Temperature: 98.4 F, Pulse: 72 bpm, Respiratory Rate: 18 breaths/min, Blood Pressure: 132/63 mmHg. Eyes Nonicteric. Reactive to light. Ears, Nose, Mouth, and Throat Lips, teeth, and gums WNL.[name] Moist mucosa without lesions. Neck supple and nontender. No palpable supraclavicular or cervical adenopathy. Normal sized without goiter. Respiratory WNL. No retractions.. Cardiovascular Pedal Pulses WNL. No clubbing, cyanosis or edema. Lymphatic No adneopathy. No adenopathy. No adenopathy. Musculoskeletal Adexa without tenderness or enlargement.. Digits and nails w/o clubbing, cyanosis, infection, petechiae, ischemia, or inflammatory conditions.[name] Psychiatric Judgement and insight Intact.. No evidence of depression, anxiety, or agitation.. General Notes: the left ankle wound is completely healed. The right ankle requires some more central ointment after had washed off the debris with a moist saline gauze. No sharp debridement was required today. she may have an element of fungal infection around the wound and I have asked her to apply some antifungal cream daily. Integumentary (Hair, Skin) No suspicious lesions. No crepitus or fluctuance. No peri-wound warmth or erythema. No masses.. Wound #1 status is Open. Original cause of wound was Gradually Appeared. The wound is located on the Left Malleolus. The wound measures 0cm length x 0cm width x 0cm depth; 0cm^2 area and 0cm^3 volume. There is no tunneling or undermining noted. There is a none present amount of drainage noted. The  wound margin is flat and intact. There is no granulation within the wound bed. There is no necrotic tissue within the wound bed. Periwound temperature was noted as No Abnormality. Wound #2 status is Open. Original cause of wound was Gradually Appeared. The wound is located on the Right Malleolus. The wound measures 0.7cm length x 0.8cm width x 0.2cm depth; 0.44cm^2 area and 0.088cm^3 volume. There is no tunneling or undermining noted. There is a large amount of serous drainage noted. The wound margin is flat and intact. There is no granulation within the wound bed. There is a large (67-100%) amount of necrotic tissue within the wound bed including Adherent Slough. The periwound skin appearance exhibited: Rubor, Erythema. The surrounding wound skin  color is noted with erythema which is Jessica Donovan, Jessica Donovan ([id number]) circumferential. Periwound temperature was noted as No Abnormality. The periwound has tenderness on palpation. 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 May shower with protection. Anesthetic: Wound #2 Right Malleolus: Topical Lidocaine 4% cream applied to wound bed prior to debridement Skin Barriers/Peri-Wound Care: Wound #2 Right Malleolus: Skin Prep Antifungal cream Primary Wound Dressing: Wound #2 Right Malleolus: Santyl Ointment Secondary Dressing: Wound #2 Right Malleolus: Dry Gauze Conform/Kerlix Dressing Change Frequency: Wound #2 Right Malleolus: Change dressing every day. Follow-up Appointments: Wound #2 Right Malleolus: Return Appointment in 1 week. Edema Control: Wound #2  Right Malleolus: [place], Jessica Donovan ([id number]) Elevate legs to the level of the heart and pump ankles as often as possible Additional Orders / Instructions: Wound #2 Right Malleolus: Increase protein intake. Activity as tolerated After review today, I have recommended: 1. Santyl ointment for the right lateral leg which she has already been using. 2. a bordered foam to her left lateral ankle. 3. Bactrim DS to be completed 4. good control of her diabetes mellitus 5. Adequate offloading of both these areas Electronic Signature(s) Signed: 08/27/2017 4:29:57 PM By: Evlyn Kanner MD, FACS Previous Signature: 08/26/2017 4:09:31 PM Version By: Evlyn Kanner MD, FACS Entered By: Evlyn Kanner on 08/27/2017 15:49:51 Jessica Donovan ([id number]) -------------------------------------------------------------------------------- SuperBill Details Patient Name: Jessica Donovan Date of Service: 08/26/2017 Medical Record Number: [id number] Patient Account Number: [id number] Date of Birth/Sex: [birth year] (71 y.o. 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 L03.115 Cellulitis of right lower limb Facility Procedures CPT4 Code: [id number] Description: 99213 - WOUND CARE VISIT-LEV 3 EST PT Modifier: Quantity: 1 Physician Procedures CPT4 Code Description: [id number] 99213 - WC PHYS LEVEL 3 - EST PT ICD-10 Description Diagnosis E11.621 Type 2 diabetes mellitus with foot ulcer L97.312 Non-pressure chronic ulcer of right ankle with fa L97.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 left ankle with  fat L03.115 Cellulitis of right lower  limb Modifier: t layer expose layer exposed Quantity: 1 d Electronic Signature(s) Signed: 08/26/2017 4:09:31 PM By: Evlyn Kanner MD, FACS Signed: 08/26/2017 4:45:09 PM By: Alejandro Mulling Entered By: Alejandro Mulling on 08/26/2017 16:07:45

## 2017-09-02 ENCOUNTER — Ambulatory Visit: Payer: PPO | Admitting: Surgery

## 2017-09-06 ENCOUNTER — Ambulatory Visit: Payer: PPO | Admitting: Surgery

## 2017-09-14 DIAGNOSIS — M5416 Radiculopathy, lumbar region: Secondary | ICD-10-CM | POA: Diagnosis not present

## 2017-09-14 DIAGNOSIS — M545 Low back pain: Secondary | ICD-10-CM | POA: Diagnosis not present

## 2017-09-16 ENCOUNTER — Encounter: Payer: PPO | Admitting: Surgery

## 2017-09-16 DIAGNOSIS — E11622 Type 2 diabetes mellitus with other skin ulcer: Secondary | ICD-10-CM | POA: Diagnosis not present

## 2017-09-16 DIAGNOSIS — E11621 Type 2 diabetes mellitus with foot ulcer: Secondary | ICD-10-CM | POA: Diagnosis not present

## 2017-09-16 DIAGNOSIS — L97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 layer exposed: Secondary | ICD-10-CM | POA: Diagnosis not present

## 2017-09-18 NOTE — Progress Notes (Signed)
Jessica, Donovan ([id number]) Visit Report for 09/16/2017 Chief Complaint Document Details Patient Name: Jessica, Donovan Date of Service: 09/16/2017 2:15 PM Medical Record Number: [id number] Patient Account Number: [id number] Date of Birth/Sex: 06/14/1946 (71 y.o. Female) Treating RN: Phillis Haggis Primary Care Provider: Garlon Hatchet Other Clinician: Referring Provider: Garlon Hatchet Treating Provider/Extender: Rudene Re in Treatment: 10 Information Obtained from: Patient Chief Complaint Patients presents for treatment of an open diabetic ulcer to the right ankle the left ankle and the right third toe Electronic Signature(s) Signed: 09/16/2017 4:10:10 PM By: Evlyn Kanner MD, FACS Entered By: Evlyn Kanner on 09/16/2017 14:27:11 Jessica Donovan ([id number]) -------------------------------------------------------------------------------- Debridement Details Patient Name: Jessica Donovan Date of Service: 09/16/2017 2:15 PM Medical Record Number: [id number] Patient Account Number: [id number] Date of Birth/Sex: November 09, 1946 (71 y.o. Female) Treating RN: Ashok Cordia, Debi Primary Care Provider: Garlon Hatchet Other Clinician: Referring Provider: Garlon Hatchet Treating Provider/Extender: Rudene Re in Treatment: 10 Debridement Performed for Wound #2 Right Malleolus Assessment: Performed By: Physician Evlyn Kanner, MD Debridement: Debridement Severity of Tissue Pre Fat layer exposed Debridement: Pre-procedure Verification/Time Out Yes - 14:17 Taken: Start Time: 14:18 Pain Control: Lidocaine 4% Topical Solution Level: Skin/Subcutaneous Tissue Total Area Debrided (L x 0.5 (cm) x 0.5 (cm) = 0.25 (cm) W): Tissue and other Viable, Non-Viable, Exudate, Fibrin/Slough, Subcutaneous material debrided: Instrument: Curette Bleeding: Minimum Hemostasis Achieved: Pressure End Time: 14:20 Procedural Pain: 0 Post Procedural Pain: 0 Response to Treatment:  Procedure was tolerated well Post Debridement Measurements of Total Wound Length: (cm) 0.5 Width: (cm) 0.5 Depth: (cm) 0.3 Volume: (cm) 0.059 Character of Wound/Ulcer Post Requires Further Debridement Debridement: Severity of Tissue Post Debridement: Fat layer exposed Post Procedure Diagnosis Same as Pre-procedure Electronic Signature(s) Signed: 09/16/2017 4:10:10 PM By: Evlyn Kanner MD, FACS Signed: 09/16/2017 4:27:23 PM By: Stevphen Rochester ([id number]) Entered By: Evlyn Kanner on 09/16/2017 14:27:02 Jessica Donovan ([id number]) -------------------------------------------------------------------------------- HPI Details Patient Name: Jessica Donovan Date of Service: 09/16/2017 2:15 PM Medical Record Number: [id number] Patient Account Number: [id number] Date of Birth/Sex: February 19, 1946 (71 y.o. Female) Treating RN: Phillis Haggis Primary Care Provider: Garlon Hatchet Other Clinician: Referring Provider: Garlon Hatchet Treating Provider/Extender: Rudene Re in Treatment: 10 History of Present Illness HPI Description: 71 year old diabetic patient who has chronic back pain, collagen vascular disease, coronary artery disease, hypertension and history of osteomyelitis of the toe in February 2017. 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 She also has had joint replacement surgery in the past. She was recently reviewed by Primary care management team and had a problem with the toe on her right foot and her left ankle had minimal drainage, and the right ankle was draining worse. She was seen in the ER on 06/22/2017, and on the x-ray, there was no evidence of any acute bony abnormality. 07/22/2017 -- she says she had a mild abrasion to her right second toe which was inadvertent but there is no open ulceration. 07/30/17 on evaluation today patient appears to be doing better in regard to her abrasion to the right second toe. It  appears there's little bit of a pressure injury although there is no opening at this point. I think this is pushing up on her shoe which is causing the issue at this point and that's due to the contraction that happening with her toe. With that being said patient wonders if there's anything that can be done in that regard for this. She has been tolerating the dressing changes without any complication and  is having really no pain. Her right lateral heel wound appears to be slough covered though there is no evidence of infection. No fevers, chills, nausea, or vomiting noted at this time. 08/09/2017 -- since last week her leg has gotten quite red and painful and she was concerned about the possibility of infection Electronic Signature(s) Signed: 09/16/2017 4:10:10 PM By: Evlyn Kanner MD, FACS Entered By: Evlyn Kanner on 09/16/2017 14:27:16 Jessica Donovan ([id number]) -------------------------------------------------------------------------------- Physical Exam Details Patient Name: Jessica Donovan Date of Service: 09/16/2017 2:15 PM Medical Record Number: [id number] Patient Account Number: [id number] Date of Birth/Sex: [birth year] (71 y.o. Female) Treating RN: Phillis Haggis Primary Care Provider: Garlon Hatchet Other Clinician: Referring Provider: Garlon Hatchet Treating Provider/Extender: Rudene Re in Treatment: 10 Constitutional . Pulse regular. Respirations normal and unlabored. Afebrile. . Eyes Nonicteric. Reactive to light. Ears, Nose, Mouth, and Throat Lips, teeth, and gums WNL.[name] Moist mucosa without lesions. Neck supple and nontender. No palpable supraclavicular or cervical adenopathy. Normal sized without goiter. Respiratory WNL. No retractions.. Cardiovascular Pedal Pulses WNL. No clubbing, cyanosis or edema. Lymphatic No adneopathy. No adenopathy. No adenopathy. Musculoskeletal Adexa without tenderness or enlargement.. Digits and nails w/o clubbing,  cyanosis, infection, petechiae, ischemia, or inflammatory conditions.. Integumentary (Hair, Skin) No suspicious lesions. No crepitus or fluctuance. No peri-wound warmth or erythema. No masses.[name] Psychiatric Judgement and insight Intact.. No evidence of depression, anxiety, or agitation.. Notes the right lateral ankle wound needed some sharp debridement with a #3 curet and minimal bleeding controlled with pressure. The redness surrounding the wound is much better after she has begun using antifungal ointment Electronic Signature(s) Signed: 09/16/2017 4:10:10 PM By: Evlyn Kanner MD, FACS Entered By: Evlyn Kanner on 09/16/2017 14:27:57 Jessica Donovan ([id number]) -------------------------------------------------------------------------------- Physician Orders Details Patient Name: Jessica Donovan Date of Service: 09/16/2017 2:15 PM Medical Record Number: [id number] Patient Account Number: [id number] Date of Birth/Sex: 1946-01-06 (71 y.o. Female) Treating RN: Ashok Cordia, Debi Primary Care Provider: Garlon Hatchet Other Clinician: Referring Provider: Garlon Hatchet Treating Provider/Extender: Rudene Re in Treatment: 10 Verbal / Phone Orders: Yes Clinician: Ashok Cordia, Debi Read Back and Verified: Yes Diagnosis Coding Wound Cleansing Wound #2 Right Malleolus o Cleanse wound with mild soap and water o May Shower, gently pat wound dry prior to applying new dressing. o May shower with protection. Anesthetic Wound #2 Right Malleolus o Topical Lidocaine 4% cream applied to wound bed prior to debridement Skin Barriers/Peri-Wound Care Wound #2 Right Malleolus o Skin Prep o Antifungal cream Primary Wound Dressing Wound #2 Right Malleolus o Santyl Ointment Secondary Dressing Wound #2 Right Malleolus o Dry Gauze o Other - coverlet Dressing Change Frequency Wound #2 Right Malleolus o Change dressing every day. Follow-up Appointments Wound #2 Right  Malleolus o Return Appointment in 2 weeks. Edema Control Wound #2 Right Malleolus o Elevate legs to the level of the heart and pump ankles as often as possible Crichlow, Jessica ([id number]) Additional Orders / Instructions Wound #2 Right Malleolus o Increase protein intake. o Activity as tolerated Electronic Signature(s) Signed: 09/16/2017 4:10:10 PM By: Evlyn Kanner MD, FACS Signed: 09/16/2017 4:27:23 PM By: Alejandro Mulling Entered By: Alejandro Mulling on 09/16/2017 14:45:49 Abt, [name] ([id number]) -------------------------------------------------------------------------------- Problem List Details Patient Name: Jessica Donovan Date of Service: 09/16/2017 2:15 PM Medical Record Number: [id number] Patient Account Number: [id number] Date of Birth/Sex: 1946/07/22 (71 y.o. 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 Female) Treating RN: Ashok Cordia, Debi Primary Care Provider: Garlon Hatchet Other  Clinician: Referring Provider: Garlon Hatchet Treating Provider/Extender: Rudene Re in Treatment: 10 Subjective Chief Complaint Information obtained from Patient Patients presents for treatment of an open diabetic ulcer to the right ankle the left ankle and the right third toe History of Present Illness (HPI) 71 year old diabetic patient who has chronic back pain, collagen vascular disease, coronary artery disease, hypertension and history of osteomyelitis of the toe in February 2017. 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 She also has had joint replacement surgery in the past. She was recently reviewed by Primary care management team and had a problem with the toe on her right foot and her left ankle had minimal drainage, and the right ankle was draining worse. She was seen in the ER on 06/22/2017, and on the x-ray, there was no evidence of any acute bony abnormality. 07/22/2017 -- she says she had a mild abrasion to her right second toe which was inadvertent but there is no open ulceration. 07/30/17 on evaluation today patient appears to be doing better in regard to her abrasion to the right second toe. It appears there's little bit of a pressure injury although there is no opening at this point. I think this is pushing up on her shoe which is causing the issue at this point and that's due to the contraction that happening with her toe. With that being said patient wonders if there's anything that can be done in that regard for this. She has been tolerating the dressing changes without any complication and is having really no pain. Her right lateral heel wound appears to be slough covered though there is no evidence of infection. No fevers, chills, nausea, or vomiting noted at this time. 08/09/2017 -- since last week her leg has gotten quite red and painful and she was concerned about the possibility of  infection Objective Constitutional Pulse regular. Respirations normal and unlabored. Afebrile. Beggs, [name] ([id number]) Vitals Time Taken: 1:58 PM, Height: 66 in, Weight: 191 lbs, BMI: 30.8, Temperature: 98.4 F, Pulse: 84 bpm, Respiratory Rate: 18 breaths/min, Blood Pressure: 150/68 mmHg. Eyes Nonicteric. Reactive to light. Ears, Nose, Mouth, and Throat Lips, teeth, and gums WNL.[name] Moist mucosa without lesions. Neck supple and nontender. No palpable supraclavicular or cervical adenopathy. Normal sized without goiter. Respiratory WNL. No retractions.. Cardiovascular Pedal Pulses WNL. No clubbing, cyanosis or edema. Lymphatic No adneopathy. No adenopathy. No adenopathy. Musculoskeletal Adexa without tenderness or enlargement.. Digits and nails w/o clubbing, cyanosis, infection, petechiae, ischemia, or inflammatory conditions.[name] Psychiatric Judgement and insight Intact.. No evidence of depression, anxiety, or agitation.. General Notes: the right lateral ankle wound needed some sharp debridement with a #3 curet and minimal bleeding controlled with pressure. The redness surrounding the wound is much better after she has begun using antifungal ointment Integumentary (Hair, Skin) No suspicious lesions. No crepitus or fluctuance. No  peri-wound warmth or erythema. No masses.. Wound #2 status is Open. Original cause of wound was Gradually Appeared. The wound is located on the Right Malleolus. The wound measures 0.5cm length x 0.5cm width x 0.2cm depth; 0.196cm^2 area and 0.039cm^3 volume. There is no tunneling or undermining noted. There is a large amount of serous drainage noted. The wound margin is flat and intact. There is no granulation within the wound bed. There is a large (67-100%) amount of necrotic tissue within the wound bed including Adherent Slough. The periwound skin appearance exhibited: Rubor, Erythema. The surrounding wound skin color is noted with erythema which  is circumferential. Periwound temperature was noted as No Abnormality. The periwound has tenderness on palpation. 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 There was a Skin/Subcutaneous Tissue Debridement ([id number]) debridement with total area of 0.25 sq cm performed by Evlyn Kanner, MD. with the following instrument(s): Curette to remove Viable and Non-Viable tissue/material including Exudate, Fibrin/Slough, and Subcutaneous after achieving pain control using Lidocaine 4% Topical Solution. A time out was conducted at 14:17, prior to the start of the procedure. A Minimum amount of bleeding was controlled with Pressure. The procedure was tolerated well with a pain level of 0 throughout and a pain level of 0 following the procedure. Post Debridement Measurements: 0.5cm length x 0.5cm width x 0.3cm depth; 0.059cm^3 volume. Character of Wound/Ulcer Post Debridement requires further debridement. Severity of Tissue Post Debridement is: Fat layer exposed. Post procedure Diagnosis Wound #2: Same as Pre-Procedure Plan Wound Cleansing: Wound #2 Right Malleolus: Cleanse wound with mild soap and water May Shower, gently pat wound dry prior to applying new dressing. May shower with protection. Anesthetic: Wound #2 Right Malleolus: Topical Lidocaine 4% cream applied to wound bed prior to debridement Skin Barriers/Peri-Wound Care: Wound #2 Right Malleolus: Skin Prep Antifungal cream Primary Wound Dressing: Laur, [name] ([id number]) Wound #2  Right Malleolus: Santyl Ointment Secondary Dressing: Wound #2 Right Malleolus: Dry Gauze Other - coverlet Dressing Change Frequency: Wound #2 Right Malleolus: Change dressing every day. Follow-up Appointments: Wound #2 Right Malleolus: Return Appointment in 2 weeks. Edema Control: Wound #2 Right Malleolus: Elevate legs to the level of the heart and pump ankles as often as possible Additional Orders / Instructions: Wound #2 Right Malleolus: Increase protein intake. Activity as tolerated After sharp debridement and review today, I have recommended: 1. Santyl ointment for the right lateral ankle. 2. a bordered foam to her left lateral ankle. 3. good control of her diabetes mellitus 4. Adequate offloading of both these areas Electronic Signature(s) Signed: 09/16/2017 4:10:10 PM By: Evlyn Kanner MD, FACS Entered By: Evlyn Kanner on 09/16/2017 15:53:41 Cale, [name] ([id number]) -------------------------------------------------------------------------------- SuperBill Details Patient Name: Jessica Donovan Date of Service: 09/16/2017 Medical Record Number: [id number] Patient Account Number: [id number] Date of Birth/Sex: 11/02/1946 (71 y.o. Female) Treating RN: Phillis Haggis Primary Care Provider: Garlon Hatchet Other Clinician: Referring Provider: Garlon Hatchet Treating Provider/Extender: Rudene Re in Treatment: 10 Diagnosis Coding ICD-10 Codes Code Description E11.621 Type 2 diabetes mellitus with foot ulcer L97.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 layer exposed L97.322 Non-pressure  chronic ulcer of left ankle with fat layer exposed L03.115 Cellulitis of right lower limb Facility Procedures CPT4 Code Description: [id number] 11042 - DEB SUBQ TISSUE 20 SQ CM/< ICD-10 Description Diagnosis E11.621 Type 2 diabetes mellitus with foot ulcer L97.312 Non-pressure chronic ulcer of right ankle with fa L97.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 left ankle   with fat L03.115 Cellulitis of right lower limb Modifier: t layer expose layer exposed Quantity: 1 d Physician Procedures CPT4 Code Description: 5409811 11042 - WC PHYS SUBQ TISS 20 SQ CM ICD-10 Description Diagnosis E11.621 Type 2 diabetes mellitus with foot ulcer L97.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.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 left ankle  with fat L03.115 Cellulitis of right lower limb Modifier: layer expose layer exposed Quantity: 1 d Electronic Signature(s) Signed: 09/16/2017 4:10:10 PM By: Evlyn Kanner MD, FACS Entered By: Evlyn Kanner on 09/16/2017 14:46:11

## 2017-09-20 NOTE — Progress Notes (Signed)
[name], [name] ([id number]) Visit Report for 09/16/2017 Arrival Information Details Patient Name: Jessica Donovan, Jessica Donovan Date of Service: 09/16/2017 2:15 PM Medical Record Number: [id number] Patient Account Number: [id number] Date of Birth/Sex: September 06, 1946 (71 y.o. Female) Treating RN: Jessica Donovan, Jessica Donovan Primary Care Jessica Donovan: Jessica Donovan Other Clinician: Referring Jessica Donovan: Jessica Donovan Treating Caprisha Bridgett/Extender: Jessica Donovan in Treatment: 10 Visit Information History Since Last Visit All ordered tests and consults were completed: No Patient Arrived: Jessica Donovan Added or deleted any medications: No Arrival Time: 13:57 Any new allergies or adverse reactions: No Accompanied By: self Had a fall or experienced change in No Transfer Assistance: None activities of daily living that may affect Patient Identification Verified: Yes risk of falls: Secondary Verification Process Yes Signs or symptoms of abuse/neglect since last No Completed: visito Patient Requires Transmission- No Hospitalized since last visit: No Based Precautions: Has Dressing in Place as Prescribed: Yes Patient Has Alerts: Yes Pain Present Now: No Patient Alerts: Patient on Blood Thinner ASA Electronic Signature(s) Signed: 09/16/2017 4:27:23 PM By: Jessica Donovan Entered By: Jessica Donovan on 09/16/2017 13:57:54 [place], Jessica Donovan ([id number]) -------------------------------------------------------------------------------- Encounter Discharge Information Details Patient Name: Jessica Donovan Date of Service: 09/16/2017 2:15 PM Medical Record Number: [id number] Patient Account Number: [id number] Date of Birth/Sex: 12-01-1946 (71 y.o. Female) Treating RN: Jessica Donovan, Jessica Donovan Primary Care Uva Runkel: Jessica Donovan Other Clinician: Referring Cobey Raineri: Jessica Donovan Treating Ashiah Karpowicz/Extender: Jessica Donovan in Treatment: 10 Encounter Discharge Information Items Discharge Pain Level: 0 Discharge  Condition: Stable Ambulatory Status: Walker Discharge Destination: Home Transportation: Other Accompanied By: self Schedule Follow-up Appointment: Yes Medication Reconciliation completed No and provided to Patient/Care Marinus Eicher: Provided on Clinical Summary of Care: 09/16/2017 Form Type Recipient Paper Patient BG Electronic Signature(s) Signed: 09/20/2017 9:00:05 AM By: Jessica Donovan Entered By: Jessica Donovan on 09/16/2017 14:28:55 Jessica Donovan ([id number]) -------------------------------------------------------------------------------- Lower Extremity Assessment Details Patient Name: Jessica Donovan Date of Service: 09/16/2017 2:15 PM Medical Record Number: [id number] Patient Account Number: [id number] Date of Birth/Sex: 01/10/1946 (71 y.o. Female) Treating RN: Jessica Donovan, Jessica Donovan Primary Care Latrise Bowland: Jessica Donovan Other Clinician: Referring Meshia Rau: Jessica Donovan Treating Tenya Araque/Extender: Jessica Donovan in Treatment: 10 Vascular Assessment Pulses: Dorsalis Pedis Palpable: [Right:Yes] Posterior Tibial Extremity colors, hair growth, and conditions: Extremity Color: [Right:Normal] Temperature of Extremity: [Right:Warm] Capillary Refill: [Right:< 3 seconds] Toe Nail Assessment Left: Right: Thick: No Discolored: No Deformed: No Improper Length and Hygiene: No Electronic Signature(s) Signed: 09/16/2017 4:27:23 PM By: Jessica Donovan Entered By: Jessica Donovan on 09/16/2017 14:04:15 Pavich, Jessica Donovan ([id number]) -------------------------------------------------------------------------------- Multi Wound Chart Details Patient Name: Jessica Donovan Date of Service: 09/16/2017 2:15 PM Medical Record Number: [id number] Patient Account Number: [id number] Date of Birth/Sex: [birth year] (71 y.o. Female) Treating RN: Jessica Donovan, Jessica Donovan Primary Care Reganne Messerschmidt: Jessica Donovan Other Clinician: Referring Dravyn Severs: Jessica Donovan Treating Kaye Luoma/Extender: Jessica Donovan in Treatment: 10 Vital Signs Height(in): 66 Pulse(bpm): 84 Weight(lbs): 191 Blood Pressure 150/68 (mmHg): Body Mass Index(BMI): 31 Temperature(F): 98.4 Respiratory Rate 18 (breaths/min): Photos: [2:No Photos] [N/A:N/A] Wound Location: [2:Right Malleolus] [N/A:N/A] Wounding Event: [2:Gradually Appeared] [N/A:N/A] Primary Etiology: [2:Diabetic Wound/Ulcer of the Lower Extremity] [N/A:N/A] Comorbid History: [2:Sleep Apnea, Coronary Artery Disease, Hypertension, Myocardial Infarction, Type II Diabetes, History of pressure wounds, Osteoarthritis, Neuropathy, Confinement Anxiety] [N/A:N/A] Date Acquired: [2:02/17/2017] [N/A:N/A] Weeks of Treatment: [2:10] [N/A:N/A] Wound Status: [2:Open] [N/A:N/A] Measurements L x W x D 0.5x0.5x0.2 [N/A:N/A] (cm) Area (cm) : [2:0.196] [N/A:N/A] Volume (cm) : [2:0.039] [N/A:N/A] % Reduction in Area: [2:84.00%] [N/A:N/A] % Reduction in Volume: 68.30% [N/A:N/A] Classification: [2:Grade 1] [N/A:N/A] Exudate Amount: [2:Large] [N/A:N/A] Exudate Type: [2:Serous] [N/A:N/A] Exudate Color: [  2:amber] [N/A:N/A] Wound Margin: [2:Flat and Intact] [N/A:N/A] Granulation Amount: [2:None Present (0%)] [N/A:N/A] Necrotic Amount: [2:Large (67-100%)] [N/A:N/A] Epithelialization: None N/A N/A Debridement: Debridement ([id number]- N/A N/A [id number]) Pre-procedure 14:17 N/A N/A Verification/Time Out Taken: Pain Control: Lidocaine 4% Topical N/A N/A Solution Tissue Debrided: Fibrin/Slough, Exudates, N/A N/A Subcutaneous Level: Skin/Subcutaneous N/A N/A Tissue Debridement Area (sq 0.25 N/A N/A cm): Instrument: Curette N/A N/A Bleeding: Minimum N/A N/A Hemostasis Achieved: Pressure N/A N/A Procedural Pain: 0 N/A N/A Post Procedural Pain: 0 N/A N/A Debridement Treatment Procedure was tolerated N/A N/A Response: well Post Debridement 0.5x0.5x0.3 N/A N/A Measurements L x W x D (cm) Post Debridement 0.059 N/A N/A Volume: (cm) Periwound Skin Texture:  No Abnormalities Noted N/A N/A Periwound Skin No Abnormalities Noted N/A N/A Moisture: Periwound Skin Color: Erythema: Yes N/A N/A Rubor: Yes Erythema Location: Circumferential N/A N/A Temperature: No Abnormality N/A N/A Tenderness on Yes N/A N/A Palpation: Wound Preparation: Ulcer Cleansing: N/A N/A Rinsed/Irrigated with Saline Topical Anesthetic Applied: Other: lidocaine 4% Procedures Performed: Debridement N/A N/A Treatment Notes Electronic Signature(s) Signed: 09/16/2017 4:10:10 PM By: Jessica Kanner Jessica Donovan, Jessica Donovan [place], Jessica Donovan ([id number]) Entered By: Jessica Donovan on 09/16/2017 14:26:54 Jessica Donovan ([id number]) -------------------------------------------------------------------------------- Multi-Disciplinary Care Plan Details Patient Name: Jessica Donovan Date of Service: 09/16/2017 2:15 PM Medical Record Number: [id number] Patient Account Number: [id number] Date of Birth/Sex: 1946/07/05 (71 y.o. Female) Treating RN: Jessica Donovan, Jessica Donovan Primary Care Odaly Peri: Jessica Donovan Other Clinician: Referring Forrest Demuro: Jessica Donovan Treating Kirra Verga/Extender: Jessica Donovan in Treatment: 10 Active Inactive ` Orientation to the Wound Care Program Nursing Diagnoses: Knowledge deficit related to the wound healing center program Goals: Patient/caregiver will verbalize understanding of the Wound Healing Center Program Date Initiated: 07/08/2017 Target Resolution Date: 10/08/2017 Goal Status: Active Interventions: Provide education on orientation to the wound center Notes: ` Peripheral Neuropathy Nursing Diagnoses: Knowledge deficit related to disease process and management of peripheral neurovascular dysfunction Potential alteration in peripheral tissue perfusion (select prior to confirmation of diagnosis) Goals: Patient/caregiver will verbalize understanding of disease process and disease management Date Initiated: 07/08/2017 Target Resolution Date: 10/08/2017 Goal  Status: Active Interventions: Assess signs and symptoms of neuropathy upon admission and as needed Provide education on Management of Neuropathy and Related Ulcers Provide education on Management of Neuropathy upon discharge from the Wound Center Treatment Activities: Patient referred for customized footwear/offloading : 07/08/2017 Patient referred to diabetes educator : 07/08/2017 Jessica Donovan ([id number]) Notes: ` Wound/Skin Impairment Nursing Diagnoses: Impaired tissue integrity Knowledge deficit related to ulceration/compromised skin integrity Goals: Patient/caregiver will verbalize understanding of skin care regimen Date Initiated: 07/08/2017 Target Resolution Date: 10/08/2017 Goal Status: Active Ulcer/skin breakdown will have a volume reduction of 30% by week 4 Date Initiated: 07/08/2017 Target Resolution Date: 10/08/2017 Goal Status: Active Ulcer/skin breakdown will have a volume reduction of 50% by week 8 Date Initiated: 07/08/2017 Target Resolution Date: 10/08/2017 Goal Status: Active Ulcer/skin breakdown will have a volume reduction of 80% by week 12 Date Initiated: 07/08/2017 Target Resolution Date: 10/08/2017 Goal Status: Active Ulcer/skin breakdown will heal within 14 weeks Date Initiated: 07/08/2017 Target Resolution Date: 10/08/2017 Goal Status: Active Interventions: Assess patient/caregiver ability to obtain necessary supplies Assess patient/caregiver ability to perform ulcer/skin care regimen upon admission and as needed Assess ulceration(s) every visit Treatment Activities: Referred to DME Ashlen Kiger for dressing supplies : 07/08/2017 Skin care regimen initiated : 07/08/2017 Topical wound management initiated : 07/08/2017 Notes: Electronic Signature(s) Signed: 09/16/2017 4:27:23 PM By: Jessica Donovan Entered By: Jessica Donovan on 09/16/2017 14:09:21 B[place]er, Jessica Donovan  ([id number]) -------------------------------------------------------------------------------- Pain Assessment  Details Patient Name: Jessica Donovan, Jessica Donovan Date of Service: 09/16/2017 2:15 PM Medical Record Number: [id number] Patient Account Number: [id number] Date of Birth/Sex: [birth year] (71 y.o. Female) Treating RN: Jessica Donovan Primary Care Andriana Casa: Jessica Donovan Other Clinician: Referring Clemie General: Jessica Donovan Treating Emaya Preston/Extender: Jessica Donovan in Treatment: 10 Active Problems Location of Pain Severity and Description of Pain Patient Has Paino No Site Locations Pain Management and Medication Current Pain Management: Electronic Signature(s) Signed: 09/16/2017 4:27:23 PM By: Jessica Donovan Entered By: Jessica Donovan on 09/16/2017 13:58:26 [place], Jessica Donovan ([id number]) -------------------------------------------------------------------------------- Patient/Caregiver Education Details Patient Name: Jessica Donovan Date of Service: 09/16/2017 2:15 PM Medical Record Number: [id number] Patient Account Number: [id number] Date of Birth/Gender: Nov 22, 1946 (71 y.o. Female) Treating RN: Jessica Donovan Primary Care Physician: Jessica Donovan Other Clinician: Referring Physician: Garlon Donovan Treating Physician/Extender: Jessica Donovan in Treatment: 10 Education Assessment Education Provided To: Patient Education Topics Provided Wound/Skin Impairment: Handouts: Other: change dressing as ordered Electronic Signature(s) Signed: 09/16/2017 4:27:23 PM By: Jessica Donovan Entered By: Jessica Donovan on 09/16/2017 14:12:58 Jessica Donovan, Jessica Donovan ([id number]) -------------------------------------------------------------------------------- Wound Assessment Details Patient Name: Jessica Donovan Date of Service: 09/16/2017 2:15 PM Medical Record Number: [id number] Patient Account Number: [id number] Date of Birth/Sex: 1946-11-25 (71 y.o. Female) Treating RN: Jessica Donovan,  Jessica Donovan Primary Care Timmie Calix: Jessica Donovan Other Clinician: Referring Aariel Ems: Jessica Donovan Treating Preston Garabedian/Extender: Jessica Donovan in Treatment: 10 Wound Status Wound Number: 2 Primary Diabetic Wound/Ulcer of the Lower Etiology: Extremity Wound Location: Right Malleolus Wound Open Wounding Event: Gradually Appeared Status: Date Acquired: 02/17/2017 Comorbid Sleep Apnea, Coronary Artery Disease, Weeks Of Treatment: 10 History: Hypertension, Myocardial Infarction, Clustered Wound: No Type II Diabetes, History of pressure wounds, Osteoarthritis, Neuropathy, Confinement Anxiety Photos Photo Uploaded By: Jessica Donovan on 09/16/2017 16:02:14 Wound Measurements Length: (cm) 0.5 Width: (cm) 0.5 Depth: (cm) 0.2 Area: (cm) 0.196 Volume: (cm) 0.039 % Reduction in Area: 84% % Reduction in Volume: 68.3% Epithelialization: None Tunneling: No Undermining: No Wound Description Classification: Grade 1 Wound Margin: Flat and Intact Exudate Amount: Large Exudate Type: Serous Exudate Color: amber Foul Odor After Cleansing: No Slough/Fibrino Yes Wound Bed Granulation Amount: None Present (0%) Necrotic Amount: Large (67-100%) Konkel, Bryce ([id number]) Necrotic Quality: Adherent Slough Periwound Skin Texture Texture Color No Abnormalities Noted: No No Abnormalities Noted: No Erythema: Yes Moisture Erythema Location: Circumferential No Abnormalities Noted: No Rubor: Yes Temperature / Pain Temperature: No Abnormality Tenderness on Palpation: Yes Wound Preparation Ulcer Cleansing: Rinsed/Irrigated with Saline Topical Anesthetic Applied: Other: lidocaine 4%, Treatment Notes Wound #2 (Right Malleolus) 1. Cleansed with: Clean wound with Normal Saline 2. Anesthetic Topical Lidocaine 4% cream to wound bed prior to debridement 4. Dressing Applied: Santyl Ointment 5. Secondary Dressing Applied Dry Gauze Notes coverlet Electronic  Signature(s) Signed: 09/16/2017 4:27:23 PM By: Jessica Donovan Entered By: Jessica Donovan on 09/16/2017 14:03:40 Laspina, Jessica Donovan ([id number]) -------------------------------------------------------------------------------- Vitals Details Patient Name: Jessica Donovan Date of Service: 09/16/2017 2:15 PM Medical Record Number: [id number] Patient Account Number: [id number] Date of Birth/Sex: [birth year] (71 y.o. Female) Treating RN: Jessica Donovan, Jessica Donovan Primary Care Chivon Lepage: Jessica Donovan Other Clinician: Referring Chantele Corado: Jessica Donovan Treating Mccartney Chuba/Extender: Jessica Donovan in Treatment: 10 Vital Signs Time Taken: 13:58 Temperature (F): 98.4 Height (in): 66 Pulse (bpm): 84 Weight (lbs): 191 Respiratory Rate (breaths/min): 18 Body Mass Index (BMI): 30.8 Blood Pressure (mmHg): 150/68 Reference Range: 80 - 120 mg / dl Electronic Signature(s) Signed: 09/16/2017 4:27:23 PM By: Jessica Donovan Entered By: Jessica Donovan on 09/16/2017 14:00:02

## 2017-09-30 ENCOUNTER — Ambulatory Visit: Payer: PPO | Admitting: Surgery

## 2017-10-07 ENCOUNTER — Encounter: Payer: PPO | Attending: Surgery | Admitting: Surgery

## 2017-10-07 DIAGNOSIS — Z89429 Acquired absence of other toe(s), unspecified side: Secondary | ICD-10-CM | POA: Diagnosis not present

## 2017-10-07 DIAGNOSIS — G8929 Other chronic pain: Secondary | ICD-10-CM | POA: Insufficient documentation

## 2017-10-07 DIAGNOSIS — I2510 Atherosclerotic heart disease of native coronary artery without angina pectoris: Secondary | ICD-10-CM | POA: Diagnosis not present

## 2017-10-07 DIAGNOSIS — L03115 Cellulitis of right lower limb: Secondary | ICD-10-CM | POA: Insufficient documentation

## 2017-10-07 DIAGNOSIS — L97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 layer exposed: Secondary | ICD-10-CM | POA: Diagnosis not present

## 2017-10-07 DIAGNOSIS — I10 Essential (primary) hypertension: Secondary | ICD-10-CM | POA: Insufficient documentation

## 2017-10-07 DIAGNOSIS — L97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 left ankle with fat layer exposed: Secondary | ICD-10-CM | POA: Diagnosis not present

## 2017-10-07 DIAGNOSIS — E11621 Type 2 diabetes mellitus with foot ulcer: Secondary | ICD-10-CM | POA: Diagnosis not present

## 2017-10-07 DIAGNOSIS — L97509 Non-pressure chronic ulcer of other part of unspecified foot with unspecified severity: Secondary | ICD-10-CM | POA: Diagnosis not present

## 2017-10-07 DIAGNOSIS — E11622 Type 2 diabetes mellitus with other skin ulcer: Secondary | ICD-10-CM | POA: Diagnosis not present

## 2017-10-11 NOTE — Progress Notes (Signed)
Jessica Donovan, Jeffifer ([id number]) Visit Report for 10/07/2017 Arrival Information Donovan Patient Name: Jessica Donovan, Jessica Donovan Date of Service: 10/07/2017 1:30 PM Medical Record Number: [id number] Patient Account Number: [id number] Date of Birth/Sex: 1946/09/06 (71 y.o. Female) Treating RN: Phillis HaggisPinkerton, Debi Primary Care Emme Rosenau: Garlon HatchetMCCONVILLE, ROBERT Other Clinician: Referring Derrious Bologna: Garlon HatchetMCCONVILLE, ROBERT Treating Cheyenne Bordeaux/Extender: Rudene ReBritto, Errol Weeks in Treatment: 13 Visit Information History Since Last Visit All ordered tests and consults were completed: No Patient Arrived: Walker Added or deleted any medications: No Arrival Time: 13:34 Any new allergies or adverse reactions: No Accompanied By: self Had a fall or experienced change in No Transfer Assistance: None activities of daily living that may affect Patient Identification Verified: Yes risk of falls: Secondary Verification Process Yes Signs or symptoms of abuse/neglect since last visito No Completed: Hospitalized since last visit: No Patient Requires Transmission-Based No Has Dressing in Place as Prescribed: Yes Precautions: Pain Present Now: No Patient Has Alerts: Yes Patient Alerts: Patient on Blood Thinner ASA Electronic Signature(s) Signed: 10/07/2017 4:19:36 PM By: Alejandro MullingPinkerton, Debra Entered By: Alejandro MullingPinkerton, Debra on 10/07/2017 13:35:04 [place], Jessica Donovan MccreedyBARBARA ([id number]) -------------------------------------------------------------------------------- Encounter Discharge Information Donovan Patient Name: Jessica Donovan, Jessica Donovan Date of Service: 10/07/2017 1:30 PM Medical Record Number: [id number] Patient Account Number: [id number] Date of Birth/Sex: 1946/09/06 (71 y.o. Female) Treating RN: Ashok CordiaPinkerton, Debi Primary Care Tegan Britain: Garlon HatchetMCCONVILLE, ROBERT Other Clinician: Referring Keishana Klinger: Garlon HatchetMCCONVILLE, ROBERT Treating Yana Schorr/Extender: Rudene ReBritto, Errol Weeks in Treatment: 6913 Encounter Discharge Information Items Discharge Pain Level: 0 Discharge  Condition: Stable Ambulatory Status: Walker Discharge Destination: Home Transportation: Other Accompanied By: self Schedule Follow-up Appointment: Yes Medication Reconciliation completed and No provided to Patient/Care Offie Pickron: Provided on Clinical Summary of Care: 10/07/2017 Form Type Recipient Paper Patient BG Electronic Signature(s) Signed: 10/11/2017 9:18:03 AM By: Gwenlyn PerkingMoore, Shelia Entered By: Gwenlyn PerkingMoore, Shelia on 10/07/2017 13:59:03 Weese, Jessica Donovan MccreedyBARBARA ([id number]) -------------------------------------------------------------------------------- Lower Extremity Assessment Donovan Patient Name: Jessica Donovan, Jessica Donovan Date of Service: 10/07/2017 1:30 PM Medical Record Number: [id number] Patient Account Number: [id number] Date of Birth/Sex: 1946/09/06 (71 y.o. Female) Treating RN: Ashok CordiaPinkerton, Debi Primary Care Rosealee Recinos: Garlon HatchetMCCONVILLE, ROBERT Other Clinician: Referring Lakeyta Vandenheuvel: Garlon HatchetMCCONVILLE, ROBERT Treating Nakea Gouger/Extender: Rudene ReBritto, Errol Weeks in Treatment: 13 Vascular Assessment Pulses: Dorsalis Pedis Palpable: [Right:Yes] Posterior Tibial Extremity colors, hair growth, and conditions: Extremity Color: [Right:Normal] Temperature of Extremity: [Right:Warm] Capillary Refill: [Right:< 3 seconds] Toe Nail Assessment Left: Right: Thick: No Discolored: No Deformed: No Improper Length and Hygiene: No Electronic Signature(s) Signed: 10/07/2017 4:19:36 PM By: Alejandro MullingPinkerton, Debra Entered By: Alejandro MullingPinkerton, Debra on 10/07/2017 13:43:21 [place], Jessica Donovan MccreedyBARBARA ([id number]) -------------------------------------------------------------------------------- Multi Jessica Chart Donovan Patient Name: Jessica Donovan, Jessica Donovan Date of Service: 10/07/2017 1:30 PM Medical Record Number: [id number] Patient Account Number: [id number] Date of Birth/Sex: 1946/09/06 (71 y.o. 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] [N/A:N/A] Comorbid History: [2:Sleep Apnea, Coronary Artery Disease, Hypertension, Myocardial Infarction, Type II Diabetes, History of pressure wounds, Osteoarthritis, Neuropathy, Confinement Anxiety] [N/A:N/A] Date Acquired: [2:02/17/2017] [N/A:N/A] Weeks of Treatment: [2:13] [N/A:N/A] Jessica Status: [2:Open] [N/A:N/A] Measurements L x W x D [2:0.5x0.6x0.2] [N/A:N/A] (cm) Area (cm) : [2:0.236] [N/A:N/A] Volume (cm) : [2:0.047] [N/A:N/A] % Reduction in Area: [2:80.70%] [N/A:N/A] % Reduction in Volume: [2:61.80%] [N/A:N/A] Classification: [2:Grade 1] [N/A:N/A] Exudate Amount: [2:Large] [N/A:N/A] Exudate Type: [2:Serous] [N/A:N/A] Exudate Color: [2:amber] [N/A:N/A]  Jessica Margin: [2:Flat and Intact] [N/A:N/A] Granulation Amount: [2:None Present (0%)] [N/A:N/A] Necrotic Amount: [2:Large (67-100%)] [N/A:N/A] Epithelialization: [2:None] [N/A:N/A] Debridement: [2:Debridement (11042-11047)] [N/A:N/A] Pre-procedure [2:13:47] [N/A:N/A] Verification/Time Out Taken: Pain Control: [2:Lidocaine 4% Topical Solution] [N/A:N/A] Tissue Debrided: [2:Fibrin/Slough, Exudates, Subcutaneous] [N/A:N/A] Level: [2:Skin/Subcutaneous Tissue] [N/A:N/A] Debridement Area (sq cm): [2:0.3] [N/A:N/A] Instrument: Curette N/A N/A Bleeding: Minimum N/A N/A Hemostasis Achieved: Pressure N/A N/A Procedural Pain: 0 N/A N/A Post Procedural Pain: 0 N/A N/A Debridement Treatment Procedure was tolerated well N/A N/A Response: Post Debridement 0.5x0.6x0.2 N/A N/A Measurements L x W x D (cm) Post Debridement Volume:  0.047 N/A N/A (cm) Periwound Skin Texture: No Abnormalities Noted N/A N/A Periwound Skin Moisture: No Abnormalities Noted N/A N/A Periwound Skin Color: Erythema: Yes N/A N/A Rubor: Yes Erythema Location: Circumferential N/A N/A Temperature: No Abnormality N/A N/A Tenderness on Palpation: Yes N/A N/A Jessica Preparation: Ulcer Cleansing: N/A N/A Rinsed/Irrigated with Saline Topical Anesthetic Applied: Other: lidocaine 4% Procedures Performed: Debridement N/A N/A Treatment Notes Jessica #2 (Right Malleolus) 1. Cleansed with: Clean Jessica with Normal Saline 2. Anesthetic Topical Lidocaine 4% cream to Jessica bed prior to debridement 3. Peri-Jessica Care: Antifungal cream 4. Dressing Applied: Santyl Ointment 5. Secondary Dressing Applied [place] Notes coverlet Electronic Signature(s) Signed: 10/07/2017 1:55:56 PM By: Evlyn Kanner MD, FACS Entered By: Evlyn Kanner on 10/07/2017 13:55:56 Jessica Donovan ([id number]) -------------------------------------------------------------------------------- Multi-Disciplinary Care Plan Donovan Patient Name: Jessica Donovan Date of Service: 10/07/2017 1:30 PM Medical Record Number: [id number] Patient Account Number: [id number] Date of Birth/Sex: 10/12/1946 (71 y.o. Female) Treating RN: Ashok Cordia, Debi Primary Care Keeon Zurn: Garlon Hatchet Other Clinician: Referring Zakee Deerman: Garlon Hatchet Treating Latia Mataya/Extender: Rudene Re in Treatment: 13 Active Inactive ` Orientation to the [facility] Nursing Diagnoses: Knowledge deficit related to the [facility] program Goals: Patient/caregiver will verbalize understanding of the [facility] Date Initiated: 07/08/2017 Target Resolution Date: 10/08/2017 Goal Status: Active Interventions: Provide education on orientation to the [facility] Notes: ` Peripheral Neuropathy Nursing Diagnoses: Knowledge deficit related to disease process and  management of peripheral neurovascular dysfunction Potential alteration in peripheral tissue perfusion (select prior to confirmation of diagnosis) Goals: Patient/caregiver will verbalize understanding of disease process and disease management Date Initiated: 07/08/2017 Target Resolution Date: 10/08/2017 Goal Status: Active Interventions: Assess signs and symptoms of neuropathy upon admission and as needed Provide education on Management of Neuropathy and Related Ulcers Provide education on Management of Neuropathy upon discharge from the [facility] Treatment Activities: Patient referred for customized footwear/offloading : 07/08/2017 Patient referred to diabetes educator : 07/08/2017 Notes: ` Jessica/Skin Impairment Nursing Diagnoses: Impaired tissue integrity Welp, Sharnay ([id number]) Knowledge deficit related to ulceration/compromised skin integrity Goals: Patient/caregiver will verbalize understanding of skin care regimen Date Initiated: 07/08/2017 Target Resolution Date: 10/08/2017 Goal Status: Active Ulcer/skin breakdown will have a volume reduction of 30% by week 4 Date Initiated: 07/08/2017 Target Resolution Date: 10/08/2017 Goal Status: Active Ulcer/skin breakdown will have a volume reduction of 50% by week 8 Date Initiated: 07/08/2017 Target Resolution Date: 10/08/2017 Goal Status: Active Ulcer/skin breakdown will have a volume reduction of 80% by week 12 Date Initiated: 07/08/2017 Target Resolution Date: 10/08/2017 Goal Status: Active Ulcer/skin breakdown will heal within 14 weeks Date Initiated: 07/08/2017 Target Resolution Date: 10/08/2017 Goal Status: Active Interventions: Assess patient/caregiver ability to obtain necessary supplies Assess patient/caregiver ability to perform ulcer/skin care regimen upon admission and as needed Assess ulceration(s) every visit Treatment Activities: Referred to DME Miri Jose for dressing supplies : 07/08/2017 Skin care regimen  initiated : 07/08/2017  Topical Jessica management initiated : 07/08/2017 Notes: Electronic Signature(s) Signed: 10/07/2017 4:19:36 PM By: Alejandro Mulling Entered By: Alejandro Mulling on 10/07/2017 13:43:25 [place], Jessica Donovan Mccreedy ([id number]) -------------------------------------------------------------------------------- Pain Assessment Donovan Patient Name: Jessica Donovan Date of Service: 10/07/2017 1:30 PM Medical Record Number: [id number] Patient Account Number: [id number] Date of Birth/Sex: [birth year] (71 y.o. Female) Treating RN: Phillis Haggis Primary Care Rynlee Lisbon: Garlon Hatchet Other Clinician: Referring Maleko Greulich: Garlon Hatchet Treating Shakiya Mcneary/Extender: Rudene Re in Treatment: 13 Active Problems Location of Pain Severity and Description of Pain Patient Has Paino No Site Locations Pain Management and Medication Current Pain Management: Electronic Signature(s) Signed: 10/07/2017 4:19:36 PM By: Alejandro Mulling Entered By: Alejandro Mulling on 10/07/2017 13:36:00 Jessica Donovan ([id number]) -------------------------------------------------------------------------------- Patient/Caregiver Education Donovan Patient Name: Jessica Donovan Date of Service: 10/07/2017 1:30 PM Medical Record Number: [id number] Patient Account Number: [id number] Date of Birth/Gender: 1946/05/11 (71 y.o. Female) Treating RN: Phillis Haggis Primary Care Physician: Garlon Hatchet Other Clinician: Referring Physician: Garlon Hatchet Treating Physician/Extender: Rudene Re in Treatment: 13 Education Assessment Education Provided To: Patient Education Topics Provided Jessica/Skin Impairment: Handouts: Other: change dressing as ordered Methods: Demonstration, Explain/Verbal Responses: State content correctly Electronic Signature(s) Signed: 10/07/2017 4:19:36 PM By: Alejandro Mulling Entered By: Alejandro Mulling on 10/07/2017 13:46:52 Kiraly, Jessica Donovan Mccreedy  ([id number]) -------------------------------------------------------------------------------- Jessica Donovan Patient Name: Jessica Donovan Date of Service: 10/07/2017 1:30 PM Medical Record Number: [id number] Patient Account Number: [id number] Date of Birth/Sex: 12-24-1945 (71 y.o. Female) Treating RN: Ashok Cordia, Debi Primary Care Latreshia Beauchaine: Garlon Hatchet Other Clinician: Referring Breiana Stratmann: Garlon Hatchet Treating Sarea Fyfe/Extender: Rudene Re in Treatment: 13 Jessica Status Jessica Number: 2 Primary Diabetic Jessica/Ulcer of the Lower Extremity Etiology: Jessica Location: Right Malleolus Jessica Open Wounding Event: Gradually Appeared Status: Date Acquired: 02/17/2017 Comorbid Sleep Apnea, Coronary Artery Disease, Weeks Of Treatment: 13 History: Hypertension, Myocardial Infarction, Type II Clustered Jessica: No Diabetes, History of pressure wounds, Osteoarthritis, Neuropathy, Confinement Anxiety Photos Photo Uploaded By: Alejandro Mulling on 10/07/2017 14:05:01 Jessica Measurements Length: (cm) 0.5 Width: (cm) 0.6 Depth: (cm) 0.2 Area: (cm) 0.236 Volume: (cm) 0.047 % Reduction in Area: 80.7% % Reduction in Volume: 61.8% Epithelialization: None Tunneling: No Undermining: No Jessica Description Classification: Grade 1 Jessica Margin: Flat and Intact Exudate Amount: Large Exudate Type: Serous Exudate Color: amber Foul Odor After Cleansing: No Slough/Fibrino Yes Jessica Bed Granulation Amount: None Present (0%) Necrotic Amount: Large (67-100%) Necrotic Quality: Adherent Slough Periwound Skin Texture Texture Color No Abnormalities Noted: No No Abnormalities Noted: No Erythema: Yes Moisture Cocke, Jessica Donovan ([id number]) No Abnormalities Noted: No Erythema Location: Circumferential Rubor: Yes Temperature / Pain Temperature: No Abnormality Tenderness on Palpation: Yes Jessica Preparation Ulcer Cleansing: Rinsed/Irrigated with Saline Topical Anesthetic  Applied: Other: lidocaine 4%, Treatment Notes Jessica #2 (Right Malleolus) 1. Cleansed with: Clean Jessica with Normal Saline 2. Anesthetic Topical Lidocaine 4% cream to Jessica bed prior to debridement 3. Peri-Jessica Care: Antifungal cream 4. Dressing Applied: Santyl Ointment 5. Secondary Dressing Applied [place] Notes coverlet Electronic Signature(s) Signed: 10/07/2017 4:19:36 PM By: Alejandro Mulling Entered By: Alejandro Mulling on 10/07/2017 13:42:52 [place], Jessica Donovan Mccreedy ([id number]) -------------------------------------------------------------------------------- Vitals Donovan Patient Name: Jessica Donovan Date of Service: 10/07/2017 1:30 PM Medical Record Number: [id number] Patient Account Number: [id number] Date of Birth/Sex: 03/03/1946 (71 y.o. Female) Treating RN: Ashok Cordia, Debi Primary Care Javione Gunawan: Garlon Hatchet Other Clinician: Referring Marilee Ditommaso: Garlon Hatchet Treating Robt Okuda/Extender: Rudene Re in Treatment: 13 Vital Signs Time Taken: 13:36 Temperature (F): 98.2 Height (in): 66 Pulse (bpm): 75 Weight (lbs): 191 Respiratory Rate (breaths/min): 18 Body Mass Index (BMI): 30.8 Blood Pressure (  mmHg): 109/51 Reference Range: 80 - 120 mg / dl Electronic Signature(s) Signed: 10/07/2017 4:19:36 PM By: Alejandro Mulling Entered By: Alejandro Mulling on 10/07/2017 13:36:51

## 2017-10-11 NOTE — Progress Notes (Signed)
Jessica Donovan, Jessica Donovan ([id number]) Visit Report for 10/07/2017 Chief Complaint Document Details Patient Name: Jessica, Donovan Date of Service: 10/07/2017 1:30 PM Medical Record Number: [id number] Patient Account Number: [id number] Date of Birth/Sex: September 13, 1946 (71 y.o. Female) Treating RN: Phillis Haggis Primary Care Provider: Garlon Hatchet Other Clinician: Referring Provider: Garlon Hatchet Treating Provider/Extender: Rudene Re in Treatment: 13 Information Obtained from: Patient Chief Complaint Patients presents for treatment of an open diabetic ulcer to the right ankle the left ankle and the right third toe Electronic Signature(s) Signed: 10/07/2017 1:56:15 PM By: Evlyn Kanner MD, FACS Entered By: Evlyn Kanner on 10/07/2017 13:56:14 Jessica Donovan ([id number]) -------------------------------------------------------------------------------- Debridement Details Patient Name: Jessica Donovan Date of Service: 10/07/2017 1:30 PM Medical Record Number: [id number] Patient Account Number: [id number] Date of Birth/Sex: Jan 08, 1946 (71 y.o. Female) Treating RN: Ashok Cordia, Debi Primary Care Provider: Garlon Hatchet Other Clinician: Referring Provider: Garlon Hatchet Treating Provider/Extender: Rudene Re in Treatment: 13 Debridement Performed for Wound #2 Right Malleolus Assessment: Performed By: Physician Evlyn Kanner, MD Debridement: Debridement Severity of Tissue Pre Fat layer exposed Debridement: Pre-procedure Verification/Time Out Yes - 13:47 Taken: Start Time: 13:48 Pain Control: Lidocaine 4% Topical Solution Level: Skin/Subcutaneous Tissue Total Area Debrided (L x 0.5 (cm) x 0.6 (cm) = 0.3 (cm) W): Tissue and other Viable, Non-Viable, Exudate, Fibrin/Slough, Subcutaneous material debrided: Instrument: Curette Bleeding: Minimum Hemostasis Achieved: Pressure End Time: 13:50 Procedural Pain: 0 Post Procedural Pain: 0 Response to  Treatment: Procedure was tolerated well Post Debridement Measurements of Total Wound Length: (cm) 0.5 Width: (cm) 0.6 Depth: (cm) 0.2 Volume: (cm) 0.047 Character of Wound/Ulcer Post Requires Further Debridement Debridement: Severity of Tissue Post Debridement: Fat layer exposed Post Procedure Diagnosis Same as Pre-procedure Electronic Signature(s) Signed: 10/07/2017 1:56:07 PM By: Evlyn Kanner MD, FACS Signed: 10/07/2017 4:19:36 PM By: Stevphen Rochester ([id number]) Entered By: Evlyn Kanner on 10/07/2017 13:56:07 Jessica Donovan ([id number]) -------------------------------------------------------------------------------- HPI Details Patient Name: Jessica Donovan Date of Service: 10/07/2017 1:30 PM Medical Record Number: [id number] Patient Account Number: [id number] Date of Birth/Sex: Apr 17, 1946 (71 y.o. Female) Treating RN: Phillis Haggis Primary Care Provider: Garlon Hatchet Other Clinician: Referring Provider: Garlon Hatchet Treating Provider/Extender: Rudene Re in Treatment: 13 History of Present Illness HPI Description: 71 year old diabetic patient who has chronic back pain, collagen vascular disease, coronary artery disease, hypertension and history of osteomyelitis of the toe in February 2017. 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 She also has had joint replacement surgery in the past. She was recently reviewed by Primary care management team and had a problem with the toe on her right foot and her left ankle had minimal drainage, and the right ankle was draining worse. She was seen in the ER on 06/22/2017, and on the x-ray, there was no evidence of any acute bony abnormality. 07/22/2017 -- she says she had a mild abrasion to her right second toe which was inadvertent but there is no open ulceration. 07/30/17 on evaluation today patient appears to be doing better in regard to her abrasion to the right  second toe. It appears there's little bit of a pressure injury although there is no opening at this point. I think this is pushing up on her shoe which is causing the issue at this point and that's due to the contraction that happening with her toe. With that being said patient wonders if there's anything that can be done in that regard for this. She has been tolerating the dressing changes without any complication and  is having really no pain. Her right lateral heel wound appears to be slough covered though there is no evidence of infection. No fevers, chills, nausea, or vomiting noted at this time. 08/09/2017 -- since last week her leg has gotten quite red and painful and she was concerned about the possibility of infection Electronic Signature(s) Signed: 10/07/2017 1:56:35 PM By: Evlyn Kanner MD, FACS Entered By: Evlyn Kanner on 10/07/2017 13:56:35 Jessica Donovan ([id number]) -------------------------------------------------------------------------------- Physical Exam Details Patient Name: Jessica Donovan Date of Service: 10/07/2017 1:30 PM Medical Record Number: [id number] Patient Account Number: [id number] Date of Birth/Sex: 10/01/1946 (71 y.o. Female) Treating RN: Phillis Haggis Primary Care Provider: Garlon Hatchet Other Clinician: Referring Provider: Garlon Hatchet Treating Provider/Extender: Rudene Re in Treatment: 13 Constitutional . Pulse regular. Respirations normal and unlabored. Afebrile. . Eyes Nonicteric. Reactive to light. Ears, Nose, Mouth, and Throat Lips, teeth, and gums WNL.[name] Moist mucosa without lesions. Neck supple and nontender. No palpable supraclavicular or cervical adenopathy. Normal sized without goiter. Respiratory WNL. No retractions.. Cardiovascular Pedal Pulses WNL. No clubbing, cyanosis or edema. Lymphatic No adneopathy. No adenopathy. No adenopathy. Musculoskeletal Adexa without tenderness or enlargement.. Digits and nails  w/o clubbing, cyanosis, infection, petechiae, ischemia, or inflammatory conditions.. Integumentary (Hair, Skin) No suspicious lesions. No crepitus or fluctuance. No peri-wound warmth or erythema. No masses.[name] Psychiatric Judgement and insight Intact.. No evidence of depression, anxiety, or agitation.. Notes sharp debridement was done with a #3 curet over the base of the ulcerated area on the right lateral ankle and there is some healthy granulation tissue. We will continue to use antifungal ointment around the main wound. Electronic Signature(s) Signed: 10/07/2017 1:57:15 PM By: Evlyn Kanner MD, FACS Entered By: Evlyn Kanner on 10/07/2017 13:57:15 Jessica Donovan ([id number]) -------------------------------------------------------------------------------- Physician Orders Details Patient Name: Jessica Donovan Date of Service: 10/07/2017 1:30 PM Medical Record Number: [id number] Patient Account Number: [id number] Date of Birth/Sex: 12-13-1946 (71 y.o. Female) Treating RN: Ashok Cordia, Debi Primary Care Provider: Garlon Hatchet Other Clinician: Referring Provider: Garlon Hatchet Treating Provider/Extender: Rudene Re in Treatment: 25 Verbal / Phone Orders: Yes Clinician: Ashok Cordia, Debi Read Back and Verified: Yes Diagnosis Coding Wound Cleansing Wound #2 Right Malleolus o Cleanse wound with mild soap and water o May Shower, gently pat wound dry prior to applying new dressing. o May shower with protection. Anesthetic Wound #2 Right Malleolus o Topical Lidocaine 4% cream applied to wound bed prior to debridement Skin Barriers/Peri-Wound Care Wound #2 Right Malleolus o Skin Prep o Antifungal cream Primary Wound Dressing Wound #2 Right Malleolus o Santyl Ointment Secondary Dressing Wound #2 Right Malleolus o Dry Gauze o Other - telfa island Dressing Change Frequency Wound #2 Right Malleolus o Change dressing every day. Follow-up  Appointments Wound #2 Right Malleolus o Return Appointment in 2 weeks. Edema Control Wound #2 Right Malleolus o Elevate legs to the level of the heart and pump ankles as often as possible Ingber, Jessica ([id number]) Additional Orders / Instructions Wound #2 Right Malleolus o Increase protein intake. o Activity as tolerated Patient Medications Allergies: codeine, methadone, influenza vaccines, percoset, tetanus toxoids, tetracyclines Notifications Medication Indication Start End econazole 10/07/2017 DOSE topical 1 % cream - cream topical as directed daily Electronic Signature(s) Signed: 10/07/2017 4:12:58 PM By: Evlyn Kanner MD, FACS Signed: 10/07/2017 4:19:36 PM By: Alejandro Mulling Previous Signature: 10/07/2017 1:55:19 PM Version By: Evlyn Kanner MD, FACS Entered By: Alejandro Mulling on 10/07/2017 14:19:57 Tison, Jessica Donovan ([id number]) -------------------------------------------------------------------------------- Problem List Details Patient Name: Jessica Donovan Date of Service: 10/07/2017 1:30 PM Medical Record  Number: [id number] Patient Account Number: [id number] Date of Birth/Sex: 28-Jun-1946 [age] y.o. 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 07/08/2017 Yes exposed L03.115 Cellulitis of right lower limb 08/09/2017 Yes Inactive Problems Resolved Problems Electronic Signature(s) Signed: 10/07/2017 1:55:51 PM By: Evlyn KannerBritto, France Lusty MD, FACS Entered By: Evlyn KannerBritto, Adamarys Shall on 10/07/2017 13:55:51 Jessica DanceGLASS, Jessica  ([id number]) -------------------------------------------------------------------------------- Progress Note Details Patient Name: Jessica DanceGLASS, Jessica Date of Service: 10/07/2017 1:30 PM Medical Record Number: [id number] Patient Account Number: [id number] Date of Birth/Sex: 28-Jun-1946 12(71 y.o. Female) Treating RN: Ashok CordiaPinkerton, Debi Primary Care Provider: Garlon HatchetMCCONVILLE, ROBERT Other Clinician: Referring Provider: Garlon HatchetMCCONVILLE, ROBERT Treating Provider/Extender: Rudene ReBritto, Jannine Abreu Weeks in Treatment: 13 Subjective Chief Complaint Information obtained from Patient Patients presents for treatment of an open diabetic ulcer to the right ankle the left ankle and the right third toe History of Present Illness (HPI) 71 year old diabetic patient who has chronic back pain, collagen vascular disease, coronary artery disease, hypertension and history of osteomyelitis of the toe in February 2017. 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 She also has had joint replacement surgery in the past. She was recently reviewed by Primary care management team and had a problem with the toe on her right foot and her left ankle had minimal drainage, and the right ankle was draining worse. She was seen in the ER on 06/22/2017, and on the x-ray, there was no evidence of any acute bony abnormality. 07/22/2017 -- she says she had a mild abrasion to her right second toe which was inadvertent but there is no open ulceration. 07/30/17 on evaluation today patient appears to be doing better in regard to her abrasion to the right second toe. It appears there's little bit of a pressure injury although there is no opening at this point. I think this is pushing up on her shoe which is causing the issue at this point and that's due to the contraction that happening with her toe. With that being said patient wonders if there's anything that can be done in that regard for this. She has been tolerating the dressing  changes without any complication and is having really no pain. Her right lateral heel wound appears to be slough covered though there is no evidence of infection. No fevers, chills, nausea, or vomiting noted at this time. 08/09/2017 -- since last week her leg has gotten quite red and painful and she was concerned about the possibility of infection Objective Constitutional Pulse regular. Respirations normal and unlabored. Afebrile. Beegle, Jessica Donovan ([id number]) Vitals Time Taken: 1:36 PM, Height: 66 in, Weight: 191 lbs, BMI: 30.8, Temperature: 98.2 F, Pulse: 75 bpm, Respiratory Rate: 18 breaths/min, Blood Pressure: 109/51 mmHg. Eyes Nonicteric. Reactive to light. Ears, Nose, Mouth, and Throat Lips, teeth, and gums WNL.[name]. Moist mucosa without lesions. Neck supple and nontender. No palpable supraclavicular or cervical adenopathy. Normal sized without goiter. Respiratory WNL. No retractions.. Cardiovascular Pedal Pulses WNL. No clubbing, cyanosis or edema. Lymphatic No adneopathy. No adenopathy. No adenopathy. Musculoskeletal Adexa without tenderness or enlargement.. Digits and nails w/o clubbing, cyanosis, infection, petechiae, ischemia, or inflammatory conditions.[name]. Psychiatric Judgement and insight Intact.. No evidence of depression, anxiety, or agitation.. General Notes:  sharp debridement was done with a #3 curet over the base of the ulcerated area on the right lateral ankle and there is some healthy granulation tissue. We will continue to use antifungal ointment around the main wound. Integumentary (Hair, Skin) No suspicious lesions. No crepitus or fluctuance. No peri-wound warmth or erythema. No masses.. Wound #2 status is Open. Original cause of wound was Gradually Appeared. The wound is located on the Right Malleolus. The wound measures 0.5cm length x 0.6cm width x 0.2cm depth; 0.236cm^2 area and 0.047cm^3 volume. There is no tunneling or undermining noted. There is a large amount  of serous drainage noted. The wound margin is flat and intact. There is no granulation within the wound bed. There is a large (67-100%) amount of necrotic tissue within the wound bed including Adherent Slough. The periwound skin appearance exhibited: Rubor, Erythema. The surrounding wound skin color is noted with erythema which is circumferential. Periwound temperature was noted as No Abnormality. The periwound has tenderness on palpation. 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 There was a Skin/Subcutaneous Tissue Debridement ([id number]) debridement with total area of 0.3 sq cm performed by Evlyn Kanner, MD. with the following instrument(s): Curette to remove Viable and Non-Viable tissue/material including Exudate, Fibrin/Slough, and Subcutaneous after achieving pain control using Lidocaine 4% Topical Solution. A time out was conducted at 13:47, prior to the start of the procedure. A Minimum amount of bleeding was controlled with Pressure. The procedure was tolerated well with a pain level of 0 throughout and a pain level of 0 following the procedure. Post Debridement Measurements: 0.5cm length x 0.6cm width x 0.2cm depth; 0.047cm^3 volume. Character of Wound/Ulcer Post Debridement requires further debridement. Severity of Tissue Post Debridement is: Fat layer exposed. Post procedure Diagnosis Wound #2: Same as Pre-Procedure Plan Wound Cleansing: Wound #2 Right Malleolus: Cleanse wound with mild soap and water May Shower, gently pat  wound dry prior to applying new dressing. May shower with protection. Anesthetic: Wound #2 Right Malleolus: Topical Lidocaine 4% cream applied to wound bed prior to debridement Skin Barriers/Peri-Wound Care: Wound #2 Right Malleolus: Skin Prep Antifungal cream Primary Wound Dressing: Milewski, Jessica Donovan ([id number]) Wound #2 Right Malleolus: Santyl Ointment Secondary Dressing: Wound #2 Right Malleolus: Dry Gauze Other - telfa island Dressing Change Frequency: Wound #2 Right Malleolus: Change dressing every day. Follow-up Appointments: Wound #2 Right Malleolus: Return Appointment in 2 weeks. Edema Control: Wound #2 Right Malleolus: Elevate legs to the level of the heart and pump ankles as often as possible Additional Orders / Instructions: Wound #2 Right Malleolus: Increase protein intake. Activity as tolerated The following medication(s) was prescribed: econazole topical 1 % cream cream topical as directed daily starting 10/07/2017 After sharp debridement and review today, I have recommended: 1. Santyl ointment for the right lateral ankle. 2. a bordered foam to her left lateral ankle. 3. good control of her diabetes mellitus 4. Adequate offloading of both these areas Electronic Signature(s) Signed: 10/07/2017 2:53:18 PM By: Evlyn Kanner MD, FACS Previous Signature: 10/07/2017 1:58:08 PM Version By: Evlyn Kanner MD, FACS Entered By: Evlyn Kanner on 10/07/2017 14:53:18 Dunson,  Jessica Donovan ([id number]) -------------------------------------------------------------------------------- SuperBill Details Patient Name: Jessica, Donovan Date of Service: 10/07/2017 Medical Record Number: [id number] Patient Account Number: [id number] Date of Birth/Sex: [birth year] (71 y.o. 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 L03.115 Cellulitis of right lower limb Facility Procedures CPT4 Code Description: [id number] 11042 - DEB SUBQ TISSUE 20 SQ CM/< ICD-10 Description Diagnosis E11.621 Type 2 diabetes mellitus with foot ulcer L97.312 Non-pressure chronic ulcer of right ankle with fa Modifier: t layer expose Quantity: 1 d Physician Procedures CPT4 Code Description: [id number] 11042 - WC PHYS SUBQ TISS 20 SQ CM ICD-10 Description Diagnosis E11.621 Type 2 diabetes mellitus with foot ulcer L97.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 Modifier: layer expose Quantity: 1 d Electronic Signature(s) Signed: 10/07/2017 1:58:24 PM By: Evlyn Kanner MD, FACS Entered By: Evlyn Kanner on 10/07/2017 13:58:24

## 2017-10-14 ENCOUNTER — Encounter: Payer: PPO | Admitting: Surgery

## 2017-10-14 DIAGNOSIS — E11621 Type 2 diabetes mellitus with foot ulcer: Secondary | ICD-10-CM | POA: Diagnosis not present

## 2017-10-14 DIAGNOSIS — L97319 Non-pressure chronic ulcer of right ankle with unspecified severity: Secondary | ICD-10-CM | POA: Diagnosis not present

## 2017-10-14 DIAGNOSIS — S91105A Unspecified open wound of left lesser toe(s) without damage to nail, initial encounter: Secondary | ICD-10-CM | POA: Diagnosis not present

## 2017-10-14 DIAGNOSIS — E11622 Type 2 diabetes mellitus with other skin ulcer: Secondary | ICD-10-CM | POA: Diagnosis not present

## 2017-10-18 NOTE — Progress Notes (Signed)
Jessica, Donovan ([id number]) Visit Report for 10/14/2017 Arrival Information Details Patient Name: Jessica Donovan, Jessica Donovan Date of Service: 10/14/2017 1:30 PM Medical Record Number: [id number] Patient Account Number: [id number] Date of Birth/Sex: 11-02-1946 (71 y.o. Female) Treating RN: Ashok Cordia, Debi Primary Care Joycelyn Liska: Garlon Hatchet Other Clinician: Referring Kassidy Dockendorf: Garlon Hatchet Treating Javionna Leder/Extender: Rudene Re in Treatment: 14 Visit Information History Since Last Visit All ordered tests and consults were completed: No Patient Arrived: Walker Added or deleted any medications: No Arrival Time: 13:48 Any new allergies or adverse reactions: No Accompanied By: self Had a fall or experienced change in No Transfer Assistance: EasyPivot Patient activities of daily living that may affect Lift risk of falls: Patient Identification Verified: Yes Signs or symptoms of abuse/neglect since last visito No Secondary Verification Process Yes Hospitalized since last visit: No Completed: Has Dressing in Place as Prescribed: Yes Patient Requires Transmission-Based No Precautions: Pain Present Now: No Patient Has Alerts: Yes Patient Alerts: Patient on Blood Thinner ASA Electronic Signature(s) Signed: 10/14/2017 4:37:37 PM By: Alejandro Mulling Entered By: Alejandro Mulling on 10/14/2017 13:48:52 Marsico, [name] ([id number]) -------------------------------------------------------------------------------- Clinic Level of Care Assessment Details Patient Name: Jessica Donovan Date of Service: 10/14/2017 1:30 PM Medical Record Number: [id number] Patient Account Number: [id number] Date of Birth/Sex: [birth year] (71 y.o. Female) Treating RN: Ashok Cordia, Debi Primary Care Vianey Caniglia: Garlon Hatchet Other Clinician: Referring Yaxiel Minnie: Garlon Hatchet Treating Kaliya Shreiner/Extender: Rudene Re in Treatment: 14 Clinic Level of Care Assessment Items TOOL 4 Quantity  Score X - Use when only an EandM is performed on FOLLOW-UP visit 1 0 ASSESSMENTS - Nursing Assessment / Reassessment X - Reassessment of Co-morbidities (includes updates in patient status) 1 10 X- 1 5 Reassessment of Adherence to Treatment Plan ASSESSMENTS - Wound and Skin Assessment / Reassessment []  - Simple Wound Assessment / Reassessment - one wound 0 X- 2 5 Complex Wound Assessment / Reassessment - multiple wounds []  - 0 Dermatologic / Skin Assessment (not related to wound area) ASSESSMENTS - Focused Assessment []  - Circumferential Edema Measurements - multi extremities 0 []  - 0 Nutritional Assessment / Counseling / Intervention []  - 0 Lower Extremity Assessment (monofilament, tuning fork, pulses) []  - 0 Peripheral Arterial Disease Assessment (using hand held doppler) ASSESSMENTS - Ostomy and/or Continence Assessment and Care []  - Incontinence Assessment and Management 0 []  - 0 Ostomy Care Assessment and Management (repouching, etc.) PROCESS - Coordination of Care X - Simple Patient / Family Education for ongoing care 1 15 []  - 0 Complex (extensive) Patient / Family Education for ongoing care []  - 0 Staff obtains Chiropractor, Records, Test Results / Process Orders []  - 0 Staff telephones HHA, Nursing Homes / Clarify orders / etc []  - 0 Routine Transfer to another Facility (non-emergent condition) []  - 0 Routine Hospital Admission (non-emergent condition) []  - 0 New Admissions / Manufacturing engineer / Ordering NPWT, Apligraf, etc. []  - 0 Emergency Hospital Admission (emergent condition) X- 1 10 Simple Discharge Coordination Rahm, Elvena ([id number]) []  - 0 Complex (extensive) Discharge Coordination PROCESS - Special Needs []  - Pediatric / Minor Patient Management 0 []  - 0 Isolation Patient Management []  - 0 Hearing / Language / Visual special needs []  - 0 Assessment of Community assistance (transportation, D/C planning, etc.) []  - 0 Additional assistance /  Altered mentation []  - 0 Support Surface(s) Assessment (bed, cushion, seat, etc.) INTERVENTIONS - Wound Cleansing / Measurement []  - Simple Wound Cleansing - one wound 0 X- 2 5 Complex Wound Cleansing - multiple wounds X- 1 5 Wound Imaging (photographs -  any number of wounds) []  - 0 Wound Tracing (instead of photographs) []  - 0 Simple Wound Measurement - one wound X- 2 5 Complex Wound Measurement - multiple wounds INTERVENTIONS - Wound Dressings X - Small Wound Dressing one or multiple wounds 2 10 []  - 0 Medium Wound Dressing one or multiple wounds []  - 0 Large Wound Dressing one or multiple wounds X- 1 5 Application of Medications - topical []  - 0 Application of Medications - injection INTERVENTIONS - Miscellaneous []  - External ear exam 0 []  - 0 Specimen Collection (cultures, biopsies, blood, body fluids, etc.) []  - 0 Specimen(s) / Culture(s) sent or taken to Lab for analysis []  - 0 Patient Transfer (multiple staff / Nurse, adult / Similar devices) []  - 0 Simple Staple / Suture removal (25 or less) []  - 0 Complex Staple / Suture removal (26 or more) []  - 0 Hypo / Hyperglycemic Management (close monitor of Blood Glucose) []  - 0 Ankle / Brachial Index (ABI) - do not check if billed separately X- 1 5 Vital Signs Schone, Prescilla ([id number]) Has the patient been seen at the hospital within the last three years: Yes Total Score: 105 Level Of Care: New/Established - Level 3 Electronic Signature(s) Signed: 10/14/2017 4:37:37 PM By: Alejandro Mulling Entered By: Alejandro Mulling on 10/14/2017 14:33:44 Helfand, [name] ([id number]) -------------------------------------------------------------------------------- Encounter Discharge Information Details Patient Name: Jessica Donovan Date of Service: 10/14/2017 1:30 PM Medical Record Number: [id number] Patient Account Number: [id number] Date of Birth/Sex: 02-23-1946 (71 y.o. Female) Treating RN: Ashok Cordia, Debi Primary Care  Sotirios Navarro: Garlon Hatchet Other Clinician: Referring Maxi Rodas: Garlon Hatchet Treating Ambriella Kitt/Extender: Rudene Re in Treatment: 14 Encounter Discharge Information Items Discharge Pain Level: 0 Discharge Condition: Stable Ambulatory Status: Walker Discharge Destination: Home Transportation: Other Accompanied By: self Schedule Follow-up Appointment: Yes Medication Reconciliation completed and No provided to Patient/Care Stirling Orton: Provided on Clinical Summary of Care: 10/14/2017 Form Type Recipient Paper Patient BG Electronic Signature(s) Signed: 10/14/2017 2:32:32 PM By: Alejandro Mulling Entered By: Alejandro Mulling on 10/14/2017 14:32:32 Barmore, [name] ([id number]) -------------------------------------------------------------------------------- Lower Extremity Assessment Details Patient Name: Jessica Donovan Date of Service: 10/14/2017 1:30 PM Medical Record Number: [id number] Patient Account Number: [id number] Date of Birth/Sex: Mar 18, 1946 (71 y.o. Female) Treating RN: Ashok Cordia, Debi Primary Care Shanon Seawright: Garlon Hatchet Other Clinician: Referring Aadarsh Cozort: Garlon Hatchet Treating Reneshia Zuccaro/Extender: Rudene Re in Treatment: 14 Vascular Assessment Pulses: Dorsalis Pedis Palpable: [Left:Yes] [Right:Yes] Posterior Tibial Extremity colors, hair growth, and conditions: Extremity Color: [Left:Normal] [Right:Normal] Temperature of Extremity: [Left:Warm] [Right:Warm] Capillary Refill: [Left:< 3 seconds] [Right:< 3 seconds] Electronic Signature(s) Signed: 10/14/2017 4:37:37 PM By: Alejandro Mulling Entered By: Alejandro Mulling on 10/14/2017 14:08:07 Canelo, [name] ([id number]) -------------------------------------------------------------------------------- Multi Wound Chart Details Patient Name: Jessica Donovan Date of Service: 10/14/2017 1:30 PM Medical Record Number: [id number] Patient Account Number: [id number] Date of Birth/Sex: 1946-12-19  (71 y.o. Female) Treating RN: Ashok Cordia, Debi Primary Care Lyrah Bradt: Garlon Hatchet Other Clinician: Referring Aryianna Earwood: Garlon Hatchet Treating Maelee Hoot/Extender: Rudene Re in Treatment: 14 Vital Signs Height(in): 66 Pulse(bpm): 78 Weight(lbs): 191 Blood Pressure(mmHg): 126/60 Body Mass Index(BMI): 31 Temperature(F): 98.5 Respiratory Rate 18 (breaths/min): Photos: [2:No Photos] [4:No Photos] [N/A:N/A] Wound Location: [2:Right Malleolus] [4:Left Toe Fourth] [N/A:N/A] Wounding Event: [2:Gradually Appeared] [4:Trauma] [N/A:N/A] Primary Etiology: [2:Diabetic Wound/Ulcer of the Lower Extremity] [4:Trauma, Other] [N/A:N/A] Comorbid History: [2:Sleep Apnea, Coronary Artery Disease, Hypertension, Myocardial Infarction, Type II Diabetes, History of pressure wounds, Osteoarthritis, Neuropathy, Confinement Anxiety] [4:Sleep Apnea, Coronary Artery Disease, Hypertension,  Myocardial Infarction, Type II Diabetes, History of pressure wounds, Osteoarthritis, Neuropathy, Confinement Anxiety] [  N/A:N/A] Date Acquired: [2:02/17/2017] [4:10/12/2017] [N/A:N/A] Weeks of Treatment: [2:14] [4:0] [N/A:N/A] Wound Status: [2:Open] [4:Open] [N/A:N/A] Measurements L x W x D [2:0.5x0.6x0.2] [4:1.1x0.8x0.1] [N/A:N/A] (cm) Area (cm) : [2:0.236] [4:0.691] [N/A:N/A] Volume (cm) : [2:0.047] [4:0.069] [N/A:N/A] % Reduction in Area: [2:80.70%] [4:N/A] [N/A:N/A] % Reduction in Volume: [2:61.80%] [4:N/A] [N/A:N/A] Classification: [2:Grade 1] [4:Partial Thickness] [N/A:N/A] Exudate Amount: [2:Large] [4:Large] [N/A:N/A] Exudate Type: [2:Serous] [4:Serosanguineous] [N/A:N/A] Exudate Color: [2:amber] [4:red, brown] [N/A:N/A] Wound Margin: [2:Flat and Intact] [4:Distinct, outline attached] [N/A:N/A] Granulation Amount: [2:None Present (0%)] [4:Small (1-33%)] [N/A:N/A] Granulation Quality: [2:N/A] [4:Red] [N/A:N/A] Necrotic Amount: [2:Large (67-100%)] [4:Large (67-100%)] [N/A:N/A] Necrotic Tissue:  [2:Adherent Slough] [4:Eschar, Adherent Slough] [N/A:N/A] Epithelialization: [2:None] [4:None] [N/A:N/A] Periwound Skin Texture: [2:No Abnormalities Noted] [4:No Abnormalities Noted] [N/A:N/A] Periwound Skin Moisture: [2:No Abnormalities Noted] [4:Maceration: Yes] [N/A:N/A] Periwound Skin Color: [2:Erythema: Yes Rubor: Yes] [4:Erythema: Yes] [N/A:N/A] Erythema Location: [2:Circumferential] [4:Circumferential] [N/A:N/A] Temperature: [2:No Abnormality] [4:No Abnormality] [N/A:N/A] Tenderness on Palpation: Yes Yes N/A Wound Preparation: Ulcer Cleansing: Ulcer Cleansing: N/A Rinsed/Irrigated with Saline Rinsed/Irrigated with Saline Topical Anesthetic Applied: Topical Anesthetic Applied: Other: lidocaine 4% Other: lidocaine 4% Treatment Notes Electronic Signature(s) Signed: 10/14/2017 2:25:07 PM By: Evlyn Kanner MD, FACS Entered By: Evlyn Kanner on 10/14/2017 14:25:07 Jessica Donovan ([id number]) -------------------------------------------------------------------------------- Multi-Disciplinary Care Plan Details Patient Name: Jessica Donovan Date of Service: 10/14/2017 1:30 PM Medical Record Number: [id number] Patient Account Number: [id number] Date of Birth/Sex: 1946-02-18 (71 y.o. Female) Treating RN: Ashok Cordia, Debi Primary Care Barney Gertsch: Garlon Hatchet Other Clinician: Referring Nakesha Ebrahim: Garlon Hatchet Treating Marianela Mandrell/Extender: Rudene Re in Treatment: 14 Active Inactive ` Orientation to the Wound Care Program Nursing Diagnoses: Knowledge deficit related to the wound healing center program Goals: Patient/caregiver will verbalize understanding of the Wound Healing Center Program Date Initiated: 07/08/2017 Target Resolution Date: 10/08/2017 Goal Status: Active Interventions: Provide education on orientation to the wound center Notes: ` Peripheral Neuropathy Nursing Diagnoses: Knowledge deficit related to disease process and management of peripheral  neurovascular dysfunction Potential alteration in peripheral tissue perfusion (select prior to confirmation of diagnosis) Goals: Patient/caregiver will verbalize understanding of disease process and disease management Date Initiated: 07/08/2017 Target Resolution Date: 10/08/2017 Goal Status: Active Interventions: Assess signs and symptoms of neuropathy upon admission and as needed Provide education on Management of Neuropathy and Related Ulcers Provide education on Management of Neuropathy upon discharge from the Wound Center Treatment Activities: Patient referred for customized footwear/offloading : 07/08/2017 Patient referred to diabetes educator : 07/08/2017 Notes: ` Wound/Skin Impairment Nursing Diagnoses: Impaired tissue integrity Menor, Zhana ([id number]) Knowledge deficit related to ulceration/compromised skin integrity Goals: Patient/caregiver will verbalize understanding of skin care regimen Date Initiated: 07/08/2017 Target Resolution Date: 10/08/2017 Goal Status: Active Ulcer/skin breakdown will have a volume reduction of 30% by week 4 Date Initiated: 07/08/2017 Target Resolution Date: 10/08/2017 Goal Status: Active Ulcer/skin breakdown will have a volume reduction of 50% by week 8 Date Initiated: 07/08/2017 Target Resolution Date: 10/08/2017 Goal Status: Active Ulcer/skin breakdown will have a volume reduction of 80% by week 12 Date Initiated: 07/08/2017 Target Resolution Date: 10/08/2017 Goal Status: Active Ulcer/skin breakdown will heal within 14 weeks Date Initiated: 07/08/2017 Target Resolution Date: 10/08/2017 Goal Status: Active Interventions: Assess patient/caregiver ability to obtain necessary supplies Assess patient/caregiver ability to perform ulcer/skin care regimen upon admission and as needed Assess ulceration(s) every visit Treatment Activities: Referred to DME Krystl Wickware for dressing supplies : 07/08/2017 Skin care regimen initiated :  07/08/2017 Topical wound management initiated : 07/08/2017 Notes: Electronic Signature(s) Signed: 10/14/2017 4:37:37 PM By: Alejandro Mulling Entered By: Alejandro Mulling on  10/14/2017 14:08:12 Jessica DanceGLASS, Angellica ([id number]) -------------------------------------------------------------------------------- Pain Assessment Details Patient Name: Jessica DanceGLASS, Nikyla Date of Service: 10/14/2017 1:30 PM Medical Record Number: [id number] Patient Account Number: [id number] Date of Birth/Sex: 11-Jul-1946 5(71 y.o. Female) Treating RN: Phillis HaggisPinkerton, Debi Primary Care Rachella Basden: Garlon HatchetMCCONVILLE, ROBERT Other Clinician: Referring Nakiesha Rumsey: Garlon HatchetMCCONVILLE, ROBERT Treating Merek Niu/Extender: Rudene ReBritto, Errol Weeks in Treatment: 14 Active Problems Location of Pain Severity and Description of Pain Patient Has Paino No Site Locations Pain Management and Medication Current Pain Management: Electronic Signature(s) Signed: 10/14/2017 4:37:37 PM By: Alejandro MullingPinkerton, Debra Entered By: Alejandro MullingPinkerton, Debra on 10/14/2017 13:48:57 Birchall, Britta MccreedyBARBARA ([id number]) -------------------------------------------------------------------------------- Patient/Caregiver Education Details Patient Name: Jessica DanceGLASS, Kripa Date of Service: 10/14/2017 1:30 PM Medical Record Number: [id number] Patient Account Number: [id number] Date of Birth/Gender: 11-Jul-1946 (71 y.o. Female) Treating RN: Phillis HaggisPinkerton, Debi Primary Care Physician: Garlon HatchetMCCONVILLE, ROBERT Other Clinician: Referring Physician: Garlon HatchetMCCONVILLE, ROBERT Treating Physician/Extender: Rudene ReBritto, Errol Weeks in Treatment: 14 Education Assessment Education Provided To: Patient Education Topics Provided Wound/Skin Impairment: Handouts: Other: change dressing as ordered Methods: Demonstration, Explain/Verbal Responses: State content correctly Electronic Signature(s) Signed: 10/14/2017 4:37:37 PM By: Alejandro MullingPinkerton, Debra Entered By: Alejandro MullingPinkerton, Debra on 10/14/2017 14:32:48 Fallaw, Britta MccreedyBARBARA  ([id number]) -------------------------------------------------------------------------------- Wound Assessment Details Patient Name: Jessica DanceGLASS, Haruka Date of Service: 10/14/2017 1:30 PM Medical Record Number: [id number] Patient Account Number: [id number] Date of Birth/Sex: 11-Jul-1946 (71 y.o. Female) Treating RN: Ashok CordiaPinkerton, Debi Primary Care Baker Moronta: Garlon HatchetMCCONVILLE, ROBERT Other Clinician: Referring Libbie Bartley: Garlon HatchetMCCONVILLE, ROBERT Treating Loi Rennaker/Extender: Rudene ReBritto, Errol Weeks in Treatment: 14 Wound Status Wound Number: 2 Primary Diabetic Wound/Ulcer of the Lower Extremity Etiology: Wound Location: Right Malleolus Wound Open Wounding Event: Gradually Appeared Status: Date Acquired: 02/17/2017 Comorbid Sleep Apnea, Coronary Artery Disease, Weeks Of Treatment: 14 History: Hypertension, Myocardial Infarction, Type II Clustered Wound: No Diabetes, History of pressure wounds, Osteoarthritis, Neuropathy, Confinement Anxiety Photos Photo Uploaded By: Alejandro MullingPinkerton, Debra on 10/14/2017 16:27:03 Wound Measurements Length: (cm) 0.5 Width: (cm) 0.6 Depth: (cm) 0.2 Area: (cm) 0.236 Volume: (cm) 0.047 % Reduction in Area: 80.7% % Reduction in Volume: 61.8% Epithelialization: None Tunneling: No Undermining: No Wound Description Classification: Grade 1 Wound Margin: Flat and Intact Exudate Amount: Large Exudate Type: Serous Exudate Color: amber Foul Odor After Cleansing: No Slough/Fibrino Yes Wound Bed Granulation Amount: None Present (0%) Necrotic Amount: Large (67-100%) Necrotic Quality: Adherent Slough Periwound Skin Texture Texture Color No Abnormalities Noted: No No Abnormalities Noted: No Erythema: Yes Moisture Dibenedetto, [place] ([id number]) No Abnormalities Noted: No Erythema Location: Circumferential Rubor: Yes Temperature / Pain Temperature: No Abnormality Tenderness on Palpation: Yes Wound Preparation Ulcer Cleansing: Rinsed/Irrigated with Saline Topical Anesthetic  Applied: Other: lidocaine 4%, Treatment Notes Wound #2 (Right Malleolus) 1. Cleansed with: Clean wound with Normal Saline 2. Anesthetic Topical Lidocaine 4% cream to wound bed prior to debridement 3. Peri-wound Care: Antifungal cream 4. Dressing Applied: Santyl Ointment 5. Secondary Dressing Applied Dry Gauze [place] Electronic Signature(s) Signed: 10/14/2017 4:37:37 PM By: Alejandro MullingPinkerton, Debra Entered By: Alejandro MullingPinkerton, Debra on 10/14/2017 13:56:36 Kauer, Britta MccreedyBARBARA ([id number]) -------------------------------------------------------------------------------- Wound Assessment Details Patient Name: Jessica DanceGLASS, Jeanae Date of Service: 10/14/2017 1:30 PM Medical Record Number: [id number] Patient Account Number: [id number] Date of Birth/Sex: 11-Jul-1946 (71 y.o. Female) Treating RN: Ashok CordiaPinkerton, Debi Primary Care Sande Pickert: Garlon HatchetMCCONVILLE, ROBERT Other Clinician: Referring Kymari Nuon: Garlon HatchetMCCONVILLE, ROBERT Treating Orien Mayhall/Extender: Rudene ReBritto, Errol Weeks in Treatment: 14 Wound Status Wound Number: 4 Primary Trauma, Other Etiology: Wound Location: Left Toe Fourth Wound Open Wounding Event: Trauma Status: Date Acquired: 10/12/2017 Comorbid Sleep Apnea, Coronary Artery Disease, Weeks Of Treatment: 0 History: Hypertension, Myocardial Infarction, Type II Clustered Wound: No Diabetes, History of pressure wounds, Osteoarthritis,  Neuropathy, Confinement Anxiety Photos Photo Uploaded By: Alejandro Mulling on 10/14/2017 16:27:48 Wound Measurements Length: (cm) 1.1 Width: (cm) 0.8 Depth: (cm) 0.1 Area: (cm) 0.691 Volume: (cm) 0.069 % Reduction in Area: % Reduction in Volume: Epithelialization: None Tunneling: No Undermining: No Wound Description Classification: Partial Thickness Wound Margin: Distinct, outline attached Exudate Amount: Large Exudate Type: Serosanguineous Exudate Color: red, brown Foul Odor After Cleansing: No Slough/Fibrino Yes Wound Bed Granulation Amount: Small (1-33%)  Exposed Structure Granulation Quality: Red Fascia Exposed: No Necrotic Amount: Large (67-100%) Fat Layer (Subcutaneous Tissue) Exposed: No Necrotic Quality: Eschar, Adherent Slough Tendon Exposed: No Muscle Exposed: No Joint Exposed: No Bone Exposed: No Wenz, Margaretmary ([id number]) Periwound Skin Texture Texture Color No Abnormalities Noted: No No Abnormalities Noted: No Erythema: Yes Moisture Erythema Location: Circumferential No Abnormalities Noted: No Maceration: Yes Temperature / Pain Temperature: No Abnormality Tenderness on Palpation: Yes Wound Preparation Ulcer Cleansing: Rinsed/Irrigated with Saline Topical Anesthetic Applied: Other: lidocaine 4%, Treatment Notes Wound #4 (Left Toe Fourth) 1. Cleansed with: Clean wound with Normal Saline 2. Anesthetic Topical Lidocaine 4% cream to wound bed prior to debridement 4. Dressing Applied: Santyl Ointment 5. Secondary Dressing Applied Dry Gauze Notes coverlet Electronic Signature(s) Signed: 10/14/2017 4:37:37 PM By: Alejandro Mulling Entered By: Alejandro Mulling on 10/14/2017 13:55:17 Avalos, [name] ([id number]) -------------------------------------------------------------------------------- Vitals Details Patient Name: Jessica Donovan Date of Service: 10/14/2017 1:30 PM Medical Record Number: [id number] Patient Account Number: [id number] Date of Birth/Sex: 12/24/1945 (71 y.o. Female) Treating RN: Ashok Cordia, Debi Primary Care Gwendlyon Zumbro: Garlon Hatchet Other Clinician: Referring Ingra Rother: Garlon Hatchet Treating Chloris Marcoux/Extender: Rudene Re in Treatment: 14 Vital Signs Time Taken: 13:49 Temperature (F): 98.5 Height (in): 66 Pulse (bpm): 78 Weight (lbs): 191 Respiratory Rate (breaths/min): 18 Body Mass Index (BMI): 30.8 Blood Pressure (mmHg): 126/60 Reference Range: 80 - 120 mg / dl Electronic Signature(s) Signed: 10/14/2017 4:37:37 PM By: Alejandro Mulling Entered By: Alejandro Mulling on  10/14/2017 13:52:38

## 2017-10-18 NOTE — Progress Notes (Signed)
DEICY, RUSK ([id number]) Visit Report for 10/14/2017 Chief Complaint Document Details Patient Name: Jessica Donovan, Jessica Donovan Date of Service: 10/14/2017 1:30 PM Medical Record Number: [id number] Patient Account Number: [id number] Date of Birth/Sex: [birth year] (71 y.o. Female) Treating RN: Phillis Haggis Primary Care Provider: Garlon Hatchet Other Clinician: Referring Provider: Garlon Hatchet Treating Provider/Extender: Rudene Re in Treatment: 14 Information Obtained from: Patient Chief Complaint Patients presents for treatment of an open diabetic ulcer to the right ankle the left ankle and the right third toe Electronic Signature(s) Signed: 10/14/2017 2:25:24 PM By: Evlyn Kanner MD, FACS Entered By: Evlyn Kanner on 10/14/2017 14:25:24 Jessica Donovan ([id number]) -------------------------------------------------------------------------------- HPI Details Patient Name: Jessica Donovan Date of Service: 10/14/2017 1:30 PM Medical Record Number: [id number] Patient Account Number: [id number] Date of Birth/Sex: 24-Mar-1946 (71 y.o. Female) Treating RN: Phillis Haggis Primary Care Provider: Garlon Hatchet Other Clinician: Referring Provider: Garlon Hatchet Treating Provider/Extender: Rudene Re in Treatment: 14 History of Present Illness HPI Description: 71 year old diabetic patient who has chronic back pain, collagen vascular disease, coronary artery disease, hypertension and history of osteomyelitis of the toe in February 2017. 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 She also has had joint replacement surgery in the past. She was recently reviewed by Primary care management team and had a problem with the toe on her right foot and her left ankle had minimal drainage, and the right ankle was draining worse. She was seen in the ER on 06/22/2017, and on the x-ray, there was no evidence of any acute bony abnormality. 07/22/2017 -- she  says she had a mild abrasion to her right second toe which was inadvertent but there is no open ulceration. 07/30/17 on evaluation today patient appears to be doing better in regard to her abrasion to the right second toe. It appears there's little bit of a pressure injury although there is no opening at this point. I think this is pushing up on her shoe which is causing the issue at this point and that's due to the contraction that happening with her toe. With that being said patient wonders if there's anything that can be done in that regard for this. She has been tolerating the dressing changes without any complication and is having really no pain. Her right lateral heel wound appears to be slough covered though there is no evidence of infection. No fevers, chills, nausea, or vomiting noted at this time. 08/09/2017 -- since last week her leg has gotten quite red and painful and she was concerned about the possibility of infection. 10/15/2017 -- she had a fall a couple of times last week and hurt her left toe. Electronic Signature(s) Signed: 10/14/2017 2:25:58 PM By: Evlyn Kanner MD, FACS Entered By: Evlyn Kanner on 10/14/2017 14:25:58 Jessica Donovan ([id number]) -------------------------------------------------------------------------------- Physical Exam Details Patient Name: Jessica Donovan Date of Service: 10/14/2017 1:30 PM Medical Record Number: [id number] Patient Account Number: [id number] Date of Birth/Sex: 02/10/1946 (71 y.o. Female) Treating RN: Phillis Haggis Primary Care Provider: Garlon Hatchet Other Clinician: Referring Provider: Garlon Hatchet Treating Provider/Extender: Rudene Re in Treatment: 14 Constitutional . Pulse regular. Respirations normal and unlabored. Afebrile. . Eyes Nonicteric. Reactive to light. Ears, Nose, Mouth, and Throat Lips, teeth, and gums WNL.[name] Moist mucosa without lesions. Neck supple and nontender. No palpable supraclavicular  or cervical adenopathy. Normal sized without goiter. Respiratory WNL. No retractions.. Cardiovascular Pedal Pulses WNL. No clubbing, cyanosis or edema. Lymphatic No adneopathy. No adenopathy. No adenopathy. Musculoskeletal Adexa without tenderness or enlargement.[name]  Digits and nails w/o clubbing, cyanosis, infection, petechiae, ischemia, or inflammatory conditions.. Integumentary (Hair, Skin) No suspicious lesions. No crepitus or fluctuance. No peri-wound warmth or erythema. No masses.[name] Psychiatric Judgement and insight Intact.. No evidence of depression, anxiety, or agitation.. Notes the right lateral ankle did not need any sharp debridement today and the tissue is looking good. The new wound on the left fourth toe at the tip and dorsum is more macerated than open. However she has an open area on the plantar aspect with some subcutaneous debris and we will use Santyl ointment in this area Electronic Signature(s) Signed: 10/14/2017 2:26:56 PM By: Evlyn Kanner MD, FACS Entered By: Evlyn Kanner on 10/14/2017 14:26:56 Jessica Donovan ([id number]) -------------------------------------------------------------------------------- Physician Orders Details Patient Name: Jessica Donovan Date of Service: 10/14/2017 1:30 PM Medical Record Number: [id number] Patient Account Number: [id number] Date of Birth/Sex: 1946-04-05 (71 y.o. Female) Treating RN: Ashok Cordia, Debi Primary Care Provider: Garlon Hatchet Other Clinician: Referring Provider: Garlon Hatchet Treating Provider/Extender: Rudene Re in Treatment: 14 Verbal / Phone Orders: Yes Clinician: Ashok Cordia, Debi Read Back and Verified: Yes Diagnosis Coding Wound Cleansing Wound #2 Right Malleolus o Cleanse wound with mild soap and water o May Shower, gently pat wound dry prior to applying new dressing. o May shower with protection. Wound #4 Left Toe Fourth o Cleanse wound with mild soap and water o May Shower,  gently pat wound dry prior to applying new dressing. o May shower with protection. Anesthetic Wound #2 Right Malleolus o Topical Lidocaine 4% cream applied to wound bed prior to debridement Wound #4 Left Toe Fourth o Topical Lidocaine 4% cream applied to wound bed prior to debridement Skin Barriers/Peri-Wound Care Wound #2 Right Malleolus o Skin Prep o Antifungal cream Primary Wound Dressing Wound #2 Right Malleolus o Santyl Ointment Wound #4 Left Toe Fourth o Santyl Ointment Secondary Dressing Wound #2 Right Malleolus o Dry Gauze o Other - telfa island Wound #4 Left Toe Fourth o Dry Gauze o Other - coverlet (band-aide) Dressing Change Frequency Wound #2 Right Malleolus o Change dressing every day. Sweatman, Savaya ([id number]) Wound #4 Left Toe Fourth o Change dressing every day. Follow-up Appointments Wound #2 Right Malleolus o Return Appointment in 1 week. Wound #4 Left Toe Fourth o Return Appointment in 1 week. Edema Control Wound #2 Right Malleolus o Elevate legs to the level of the heart and pump ankles as often as possible Wound #4 Left Toe Fourth o Elevate legs to the level of the heart and pump ankles as often as possible Additional Orders / Instructions Wound #2 Right Malleolus o Increase protein intake. o Activity as tolerated Wound #4 Left Toe Fourth o Increase protein intake. o Activity as tolerated Electronic Signature(s) Signed: 10/14/2017 4:33:04 PM By: Evlyn Kanner MD, FACS Signed: 10/14/2017 4:37:37 PM By: Alejandro Mulling Entered By: Alejandro Mulling on 10/14/2017 14:15:35 Boney, [name] ([id number]) -------------------------------------------------------------------------------- Problem List Details Patient Name: Jessica Donovan Date of Service: 10/14/2017 1:30 PM Medical Record Number: [id number] Patient Account Number: [id number] Date of Birth/Sex: 24-Mar-1946 (71 y.o. Female) Treating RN: Phillis Haggis Primary Care Provider: Garlon Hatchet Other Clinician: Referring Provider: Garlon Hatchet Treating Provider/Extender: Rudene Re in Treatment: 14 Active Problems ICD-10 Encounter Code Description Active Date Diagnosis E11.621 Type 2 diabetes mellitus with foot ulcer 07/08/2017 Yes L97.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 layer exposed 07/08/2017 Yes L97.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 left ankle with fat layer exposed 07/08/2017 Yes L03.115 Cellulitis of right lower limb 08/09/2017 Yes L97.522 Non-pressure chronic ulcer  of other part of left foot with fat layer 10/14/2017 Yes exposed Inactive Problems Resolved Problems Electronic Signature(s) Signed: 10/14/2017 2:25:00 PM By: Evlyn Kanner MD, FACS Entered By: Evlyn Kanner on 10/14/2017 14:24:59 Jessica Donovan ([id number]) -------------------------------------------------------------------------------- Progress Note Details Patient Name: Jessica Donovan Date of Service: 10/14/2017 1:30 PM Medical Record Number: [id number] Patient Account Number: [id number] Date of Birth/Sex: 1946/02/25 (71 y.o. Female) Treating RN: Ashok Cordia, Debi Primary Care Provider: Garlon Hatchet Other Clinician: Referring Provider: Garlon Hatchet Treating Provider/Extender: Rudene Re in Treatment: 14 Subjective Chief Complaint Information obtained from Patient Patients presents for treatment of an open diabetic ulcer to the right ankle the left ankle and the right third toe History of Present Illness (HPI) 71 year old diabetic patient who has chronic back pain, collagen vascular disease, coronary artery disease, hypertension and history of osteomyelitis of the toe in February 2017. 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 She also has had joint replacement surgery in the past. She was recently reviewed by Primary care management team and had a problem with the toe on her  right foot and her left ankle had minimal drainage, and the right ankle was draining worse. She was seen in the ER on 06/22/2017, and on the x-ray, there was no evidence of any acute bony abnormality. 07/22/2017 -- she says she had a mild abrasion to her right second toe which was inadvertent but there is no open ulceration. 07/30/17 on evaluation today patient appears to be doing better in regard to her abrasion to the right second toe. It appears there's little bit of a pressure injury although there is no opening at this point. I think this is pushing up on her shoe which is causing the issue at this point and that's due to the contraction that happening with her toe. With that being said patient wonders if there's anything that can be done in that regard for this. She has been tolerating the dressing changes without any complication and is having really no pain. Her right lateral heel wound appears to be slough covered though there is no evidence of infection. No fevers, chills, nausea, or vomiting noted at this time. 08/09/2017 -- since last week her leg has gotten quite red and painful and she was concerned about the possibility of infection. 10/15/2017 -- she had a fall a couple of times last week and hurt her left toe. Objective Constitutional Pulse regular. Respirations normal and unlabored. Afebrile. Vitals Time Taken: 1:49 PM, Height: 66 in, Weight: 191 lbs, BMI: 30.8, Temperature: 98.5 F, Pulse: 78 bpm, Respiratory Rate: 18 breaths/min, Blood Pressure: 126/60 mmHg. Eyes Nonicteric. Reactive to light. Ears, Nose, Mouth, and Throat Acrey, Caiden ([id number]) Lips, teeth, and gums WNL.[name] Moist mucosa without lesions. Neck supple and nontender. No palpable supraclavicular or cervical adenopathy. Normal sized without goiter. Respiratory WNL. No retractions.. Cardiovascular Pedal Pulses WNL. No clubbing, cyanosis or edema. Lymphatic No adneopathy. No adenopathy. No  adenopathy. Musculoskeletal Adexa without tenderness or enlargement.. Digits and nails w/o clubbing, cyanosis, infection, petechiae, ischemia, or inflammatory conditions.[name] Psychiatric Judgement and insight Intact.. No evidence of depression, anxiety, or agitation.. General Notes: the right lateral ankle did not need any sharp debridement today and the tissue is looking good. The new wound on the left fourth toe at the tip and dorsum is more macerated than open. However she has an open area on the plantar aspect with some subcutaneous debris and we will use Santyl ointment in this area Integumentary (Hair,  Skin) No suspicious lesions. No crepitus or fluctuance. No peri-wound warmth or erythema. No masses.. Wound #2 status is Open. Original cause of wound was Gradually Appeared. The wound is located on the Right Malleolus. The wound measures 0.5cm length x 0.6cm width x 0.2cm depth; 0.236cm^2 area and 0.047cm^3 volume. There is no tunneling or undermining noted. There is a large amount of serous drainage noted. The wound margin is flat and intact. There is no granulation within the wound bed. There is a large (67-100%) amount of necrotic tissue within the wound bed including Adherent Slough. The periwound skin appearance exhibited: Rubor, Erythema. The surrounding wound skin color is noted with erythema which is circumferential. Periwound temperature was noted as No Abnormality. The periwound has tenderness on palpation. Wound #4 status is Open. Original cause of wound was Trauma. The wound is located on the Left Toe Fourth. The wound measures 1.1cm length x 0.8cm width x 0.1cm depth; 0.691cm^2 area and 0.069cm^3 volume. There is no tunneling or undermining noted. There is a large amount of serosanguineous drainage noted. The wound margin is distinct with the outline attached to the wound base. There is small (1-33%) red granulation within the wound bed. There is a large (67-100%) amount of  necrotic tissue within the wound bed including Eschar and Adherent Slough. The periwound skin appearance exhibited: Maceration, Erythema. The surrounding wound skin color is noted with erythema which is circumferential. Periwound temperature was noted as No Abnormality. The periwound has tenderness on palpation. Assessment Active Problems ICD-10 E11.621 - Type 2 diabetes mellitus with foot ulcer L97.312 - Non-pressure chronic ulcer of right ankle with fat layer exposed L97.322 - Non-pressure chronic ulcer of left ankle with fat layer exposed L03.115 - Cellulitis of right lower limb L97.522 - Non-pressure chronic ulcer of other part of left foot with fat layer exposed Rottinghaus, Tosha ([id number]) Plan Wound Cleansing: Wound #2 Right Malleolus: Cleanse wound with mild soap and water May Shower, gently pat wound dry prior to applying new dressing. May shower with protection. Wound #4 Left Toe Fourth: Cleanse wound with mild soap and water May Shower, gently pat wound dry prior to applying new dressing. May shower with protection. Anesthetic: Wound #2 Right Malleolus: Topical Lidocaine 4% cream applied to wound bed prior to debridement Wound #4 Left Toe Fourth: Topical Lidocaine 4% cream applied to wound bed prior to debridement Skin Barriers/Peri-Wound Care: Wound #2 Right Malleolus: Skin Prep Antifungal cream Primary Wound Dressing: Wound #2 Right Malleolus: Santyl Ointment Wound #4 Left Toe Fourth: Santyl Ointment Secondary Dressing: Wound #2 Right Malleolus: Dry Gauze Other - telfa island Wound #4 Left Toe Fourth: Dry Gauze Other - coverlet (band-aide) Dressing Change Frequency: Wound #2 Right Malleolus: Change dressing every day. Wound #4 Left Toe Fourth: Change dressing every day. Follow-up Appointments: Wound #2 Right Malleolus: Return Appointment in 1 week. Wound #4 Left Toe Fourth: Return Appointment in 1 week. Edema Control: Wound #2 Right Malleolus: Elevate  legs to the level of the heart and pump ankles as often as possible Wound #4 Left Toe Fourth: Elevate legs to the level of the heart and pump ankles as often as possible Additional Orders / Instructions: Wound #2 Right Malleolus: Increase protein intake. Activity as tolerated Wound #4 Left Toe Fourth: Increase protein intake. Pronovost, Dajia ([id number]) Activity as tolerated After review of the new wound on her left fourth toe today, I have recommended: 1. Santyl ointment for the right lateral ankle and also to the left fourth toe 2. a bordered  foam to her left lateral ankle and the left fourth toe 3. good control of her diabetes mellitus 4. Adequate offloading of both these areas Electronic Signature(s) Signed: 10/14/2017 2:28:05 PM By: Evlyn KannerBritto, Rylie Limburg MD, FACS Entered By: Evlyn KannerBritto, Vanesa Renier on 10/14/2017 14:28:05 Jessica DanceGLASS, Mikiya ([id number]) -------------------------------------------------------------------------------- SuperBill Details Patient Name: Jessica DanceGLASS, Teria Date of Service: 10/14/2017 Medical Record Number: [id number] Patient Account Number: [id number] Date of Birth/Sex: 13-Sep-1946 [age] y.o. 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.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 left ankle with fat layer exposed L03.115 Cellulitis of right lower limb L97.522 Non-pressure chronic ulcer of other part of left foot with fat layer exposed Facility Procedures CPT4 Code: [id number] Description: 99213 - WOUND CARE VISIT-LEV 3 EST PT Modifier: Quantity: 1 Physician Procedures CPT4 Code: [id number] Description: 99213 - WC PHYS LEVEL 3 - EST PT ICD-10 Diagnosis Description  E11.621 Type 2 diabetes mellitus with foot ulcer L97.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 layer expo L97.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 left ankle with fat  layer expos L97.522 Non-pressure chronic ulcer of other part of left foot with fa Modifier: sed ed t layer exposed Quantity: 1 Electronic Signature(s) Signed: 10/14/2017 2:33:57 PM By: Alejandro MullingPinkerton, Debra Signed: 10/14/2017 4:33:04 PM By: Evlyn KannerBritto, Pau Banh MD, FACS Previous Signature: 10/14/2017 2:28:51 PM Version By: Evlyn KannerBritto, Kalyse Meharg MD, FACS Previous Signature: 10/14/2017 2:28:23 PM Version By: Evlyn KannerBritto, Annamae Shivley MD, FACS Entered By: Alejandro MullingPinkerton, Debra on 10/14/2017 14:33:56

## 2017-10-21 ENCOUNTER — Encounter: Payer: PPO | Attending: Surgery | Admitting: Surgery

## 2017-10-21 DIAGNOSIS — L97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 left ankle with fat layer exposed: Secondary | ICD-10-CM | POA: Diagnosis not present

## 2017-10-21 DIAGNOSIS — Z89429 Acquired absence of other toe(s), unspecified side: Secondary | ICD-10-CM | POA: Diagnosis not present

## 2017-10-21 DIAGNOSIS — L97522 Non-pressure chronic ulcer of other part of left foot with fat layer exposed: Secondary | ICD-10-CM | POA: Diagnosis not present

## 2017-10-21 DIAGNOSIS — E11621 Type 2 diabetes mellitus with foot ulcer: Secondary | ICD-10-CM | POA: Diagnosis not present

## 2017-10-21 DIAGNOSIS — L03115 Cellulitis of right lower limb: Secondary | ICD-10-CM | POA: Insufficient documentation

## 2017-10-21 DIAGNOSIS — G8929 Other chronic pain: Secondary | ICD-10-CM | POA: Insufficient documentation

## 2017-10-21 DIAGNOSIS — S91105A Unspecified open wound of left lesser toe(s) without damage to nail, initial encounter: Secondary | ICD-10-CM | POA: Diagnosis not present

## 2017-10-21 DIAGNOSIS — L97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 layer exposed: Secondary | ICD-10-CM | POA: Diagnosis not present

## 2017-10-21 DIAGNOSIS — I2510 Atherosclerotic heart disease of native coronary artery without angina pectoris: Secondary | ICD-10-CM | POA: Insufficient documentation

## 2017-10-21 DIAGNOSIS — I10 Essential (primary) hypertension: Secondary | ICD-10-CM | POA: Insufficient documentation

## 2017-10-23 NOTE — Progress Notes (Signed)
Jessica Donovan, Jessica Donovan ([id number]) Visit Report for 10/21/2017 Chief Complaint Document Details Patient Name: Jessica Donovan Date of Service: 10/21/2017 1:30 PM Medical Record Number: [id number] Patient Account Number: [id number] Date of Birth/Sex: Sep 10, 1946 (71 y.o. Female) Treating RN: Phillis Haggis Primary Care Provider: Garlon Hatchet Other Clinician: Referring Provider: Garlon Hatchet Treating Provider/Extender: Rudene Re in Treatment: 15 Information Obtained from: Patient Chief Complaint Patients presents for treatment of an open diabetic ulcer to the right ankle the left ankle and the right third toe Electronic Signature(s) Signed: 10/21/2017 2:16:13 PM By: Evlyn Kanner MD, FACS Entered By: Evlyn Kanner on 10/21/2017 14:16:13 Jessica Donovan ([id number]) -------------------------------------------------------------------------------- Debridement Details Patient Name: Jessica Donovan Date of Service: 10/21/2017 1:30 PM Medical Record Number: [id number] Patient Account Number: [id number] Date of Birth/Sex: 28-Apr-1946 (71 y.o. Female) Treating RN: Ashok Cordia, Debi Primary Care Provider: Garlon Hatchet Other Clinician: Referring Provider: Garlon Hatchet Treating Provider/Extender: Rudene Re in Treatment: 15 Debridement Performed for Wound #4 Left Toe Fourth Assessment: Performed By: Physician Evlyn Kanner, MD Debridement: Debridement Pre-procedure Verification/Time Yes - 13:48 Out Taken: Start Time: 13:49 Pain Control: Lidocaine 4% Topical Solution Level: Skin/Subcutaneous Tissue Total Area Debrided (L x W): 2 (cm) x 1 (cm) = 2 (cm) Tissue and other material Viable, Non-Viable, Exudate, Fibrin/Slough, Subcutaneous debrided: Instrument: Curette Bleeding: Minimum Hemostasis Achieved: Pressure End Time: 13:51 Procedural Pain: 0 Post Procedural Pain: 0 Response to Treatment: Procedure was tolerated well Post Debridement Measurements of Total  Wound Length: (cm) 2 Width: (cm) 1.2 Depth: (cm) 0.1 Volume: (cm) 0.188 Character of Wound/Ulcer Post Debridement: Requires Further Debridement Post Procedure Diagnosis Same as Pre-procedure Electronic Signature(s) Signed: 10/21/2017 2:16:04 PM By: Evlyn Kanner MD, FACS Signed: 10/21/2017 4:53:27 PM By: Alejandro Mulling Entered By: Evlyn Kanner on 10/21/2017 14:16:03 Jessica Donovan ([id number]) -------------------------------------------------------------------------------- HPI Details Patient Name: Jessica Donovan Date of Service: 10/21/2017 1:30 PM Medical Record Number: [id number] Patient Account Number: [id number] Date of Birth/Sex: May 20, 1946 (71 y.o. Female) Treating RN: Phillis Haggis Primary Care Provider: Garlon Hatchet Other Clinician: Referring Provider: Garlon Hatchet Treating Provider/Extender: Rudene Re in Treatment: 15 History of Present Illness HPI Description: 71 year old diabetic patient who has chronic back pain, collagen vascular disease, coronary artery disease, hypertension and history of osteomyelitis of the toe in February 2017. 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 She also has had joint replacement surgery in the past. She was recently reviewed by Primary care management team and had a problem with the toe on her right foot and her left ankle had minimal drainage, and the right ankle was draining worse. She was seen in the ER on 06/22/2017, and on the x-ray, there was no evidence of any acute bony abnormality. 07/22/2017 -- she says she had a mild abrasion to her right second toe which was inadvertent but there is no open ulceration. 07/30/17 on evaluation today patient appears to be doing better in regard to her abrasion to the right second toe. It appears there's little bit of a pressure injury although there is no opening at this point. I think this is pushing up on her shoe which is causing the issue at  this point and that's due to the contraction that happening with her toe. With that being said patient wonders if there's anything that can be done in that regard for this. She has been tolerating the dressing changes without any complication and is having really no pain. Her right lateral heel wound appears to be slough covered though there is  no evidence of infection. No fevers, chills, nausea, or vomiting noted at this time. 08/09/2017 -- since last week her leg has gotten quite red and painful and she was concerned about the possibility of infection. 10/15/2017 -- she had a fall a couple of times last week and hurt her left toe. Electronic Signature(s) Signed: 10/21/2017 2:16:21 PM By: Evlyn KannerBritto, Aliena Ghrist MD, FACS Entered By: Evlyn KannerBritto, Bethannie Iglehart on 10/21/2017 14:16:21 Jessica Donovan, Jessica Donovan ([id number]) -------------------------------------------------------------------------------- Physical Exam Details Patient Name: Jessica Donovan, Jessica Donovan Date of Service: 10/21/2017 1:30 PM Medical Record Number: [id number] Patient Account Number: [id number] Date of Birth/Sex: Sep 11, 1946 (71 y.o. Female) Treating RN: Phillis HaggisPinkerton, Debi Primary Care Provider: Garlon HatchetMCCONVILLE, ROBERT Other Clinician: Referring Provider: Garlon HatchetMCCONVILLE, ROBERT Treating Provider/Extender: Rudene ReBritto, Tegan Burnside Weeks in Treatment: 15 Constitutional . Pulse regular. Respirations normal and unlabored. Afebrile. . Eyes Nonicteric. Reactive to light. Ears, Nose, Mouth, and Throat Lips, teeth, and gums WNL.[name]. Moist mucosa without lesions. Neck supple and nontender. No palpable supraclavicular or cervical adenopathy. Normal sized without goiter. Respiratory WNL. No retractions.. Cardiovascular Pedal Pulses WNL. No clubbing, cyanosis or edema. Lymphatic No adneopathy. No adenopathy. No adenopathy. Musculoskeletal Adexa without tenderness or enlargement.. Digits and nails w/o clubbing, cyanosis, infection, petechiae, ischemia, or inflammatory conditions.. Integumentary  (Hair, Skin) No suspicious lesions. No crepitus or fluctuance. No peri-wound warmth or erythema. No masses.[name]. Psychiatric Judgement and insight Intact.. No evidence of depression, anxiety, or agitation.. Notes After washing the 2 wounds on the right ankle and right fourth toe there look clean and will benefit from Prisma AG The left fourth toe required sharp debridement with a #3 curet and after doing so I have recommended silver alginate Electronic Signature(s) Signed: 10/21/2017 2:17:13 PM By: Evlyn KannerBritto, Dalen Henn[place] MD, FACS Entered By: Evlyn KannerBritto, Jood Retana on 10/21/2017 14:17:12 Jessica Donovan, Jessica Donovan ([id number]) -------------------------------------------------------------------------------- Physician Orders Details Patient Name: Jessica Donovan, Jessica Donovan Date of Service: 10/21/2017 1:30 PM Medical Record Number: [id number] Patient Account Number: [id number] Date of Birth/Sex: Sep 11, 1946 (71 y.o. Female) Treating RN: Ashok CordiaPinkerton, Debi Primary Care Provider: Garlon HatchetMCCONVILLE, ROBERT Other Clinician: Referring Provider: Garlon HatchetMCCONVILLE, ROBERT Treating Provider/Extender: Rudene ReBritto, Uma Jerde Weeks in Treatment: 15 Verbal / Phone Orders: Yes Clinician: Ashok CordiaPinkerton, Debi Read Back and Verified: Yes Diagnosis Coding Wound Cleansing Wound #2 Right Malleolus o Cleanse wound with mild soap and water o May Shower, gently pat wound dry prior to applying new dressing. o May shower with protection. Wound #5 Right Toe Fourth o Cleanse wound with mild soap and water o May Shower, gently pat wound dry prior to applying new dressing. o May shower with protection. Wound #4 Left Toe Fourth o Cleanse wound with mild soap and water o May Shower, gently pat wound dry prior to applying new dressing. o May shower with protection. Anesthetic Wound #2 Right Malleolus o Topical Lidocaine 4% cream applied to wound bed prior to debridement Wound #5 Right Toe Fourth o Topical Lidocaine 4% cream applied to wound bed prior to  debridement Wound #4 Left Toe Fourth o Topical Lidocaine 4% cream applied to wound bed prior to debridement Skin Barriers/Peri-Wound Care Wound #2 Right Malleolus o Skin Prep o Antifungal cream Primary Wound Dressing Wound #2 Right Malleolus o Prisma Ag Wound #4 Left Toe Fourth o Silvercel Non-Adherent Wound #5 Right Toe Fourth o Prisma Ag Secondary Dressing Wound #2 Right Malleolus Kaleta, Jeremiah ([id number]) o Dry Gauze o Other - telfa island Wound #4 Left Toe Fourth o Dry Gauze o Other - coverlet (band-aide) Wound #5 Right Toe Fourth o Dry Gauze o Other - coverlet (band-aide) Dressing Change Frequency Wound #2 Right Malleolus   o Change dressing every other day. Wound #4 Left Toe Fourth o Change dressing every other day. Wound #5 Right Toe Fourth o Change dressing every other day. Follow-up Appointments Wound #2 Right Malleolus o Return Appointment in 1 week. Wound #5 Right Toe Fourth o Return Appointment in 1 week. Wound #4 Left Toe Fourth o Return Appointment in 1 week. Edema Control Wound #2 Right Malleolus o Elevate legs to the level of the heart and pump ankles as often as possible Wound #5 Right Toe Fourth o Elevate legs to the level of the heart and pump ankles as often as possible Wound #4 Left Toe Fourth o Elevate legs to the level of the heart and pump ankles as often as possible Additional Orders / Instructions Wound #2 Right Malleolus o Increase protein intake. o Activity as tolerated Wound #5 Right Toe Fourth o Increase protein intake. o Activity as tolerated Wound #4 Left Toe Fourth o Increase protein intake. o Activity as tolerated Minter, Alisabeth ([id number]) Electronic Signature(s) Signed: 10/21/2017 4:34:23 PM By: Evlyn Kanner MD, FACS Signed: 10/21/2017 4:53:27 PM By: Alejandro Mulling Entered By: Alejandro Mulling on 10/21/2017 13:53:19 Costanza, [name]  ([id number]) -------------------------------------------------------------------------------- Problem List Details Patient Name: Jessica Donovan Date of Service: 10/21/2017 1:30 PM Medical Record Number: [id number] Patient Account Number: [id number] Date of Birth/Sex: Apr 29, 1946 (71 y.o. Female) Treating RN: Phillis Haggis Primary Care Provider: Garlon Hatchet Other Clinician: Referring Provider: Garlon Hatchet Treating Provider/Extender: Rudene Re in Treatment: 15 Active Problems ICD-10 Encounter Code Description Active Date Diagnosis E11.621 Type 2 diabetes mellitus with foot ulcer 07/08/2017 Yes L97.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 layer exposed 07/08/2017 Yes L97.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 left ankle with fat layer exposed 07/08/2017 Yes L03.115 Cellulitis of right lower limb 08/09/2017 Yes L97.522 Non-pressure chronic ulcer of other part of left foot with fat layer 10/14/2017 Yes exposed Inactive Problems Resolved Problems Electronic Signature(s) Signed: 10/21/2017 2:15:47 PM By: Evlyn Kanner MD, FACS Entered By: Evlyn Kanner on 10/21/2017 14:15:47 Jessica Donovan ([id number]) -------------------------------------------------------------------------------- Progress Note Details Patient Name: Jessica Donovan Date of Service: 10/21/2017 1:30 PM Medical Record Number: [id number] Patient Account Number: [id number] Date of Birth/Sex: [birth year] (70 y.o. Female) Treating RN: Ashok Cordia, Debi Primary Care Provider: Garlon Hatchet Other Clinician: Referring Provider: Garlon Hatchet Treating Provider/Extender: Rudene Re in Treatment: 15 Subjective Chief Complaint Information obtained from Patient Patients presents for treatment of an open diabetic ulcer to the right ankle the left ankle and the right third toe History of Present Illness (HPI) 71 year old diabetic patient who has chronic back pain, collagen vascular disease,  coronary artery disease, hypertension and history of osteomyelitis of the toe in February 2017. 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 She also has had joint replacement surgery in the past. She was recently reviewed by Primary care management team and had a problem with the toe on her right foot and her left ankle had minimal drainage, and the right ankle was draining worse. She was seen in the ER on 06/22/2017, and on the x-ray, there was no evidence of any acute bony abnormality. 07/22/2017 -- she says she had a mild abrasion to her right second toe which was inadvertent but there is no open ulceration. 07/30/17 on evaluation today patient appears to be doing better in regard to her abrasion to the right second toe. It appears there's little bit of a pressure injury although there is no opening at this point. I think this is  pushing up on her shoe which is causing the issue at this point and that's due to the contraction that happening with her toe. With that being said patient wonders if there's anything that can be done in that regard for this. She has been tolerating the dressing changes without any complication and is having really no pain. Her right lateral heel wound appears to be slough covered though there is no evidence of infection. No fevers, chills, nausea, or vomiting noted at this time. 08/09/2017 -- since last week her leg has gotten quite red and painful and she was concerned about the possibility of infection. 10/15/2017 -- she had a fall a couple of times last week and hurt her left toe. Patient History Information obtained from Patient. Family History Stroke - Mother, No family history of Cancer, Diabetes, Heart Disease, Hereditary Spherocytosis, Hypertension, Kidney Disease, Lung Disease, Seizures, Thyroid Problems, Tuberculosis. Social History Never smoker, Marital Status - Single, Alcohol Use - Never, Drug Use - No History, Caffeine Use -  Never. Jessica Donovan, Jessica Donovan ([id number]) Objective Constitutional Pulse regular. Respirations normal and unlabored. Afebrile. Vitals Time Taken: 1:26 PM, Height: 66 in, Weight: 191 lbs, BMI: 30.8, Temperature: 98.5 F, Pulse: 83 bpm, Respiratory Rate: 18 breaths/min, Blood Pressure: 141/56 mmHg. Eyes Nonicteric. Reactive to light. Ears, Nose, Mouth, and Throat Lips, teeth, and gums WNL.[name] Moist mucosa without lesions. Neck supple and nontender. No palpable supraclavicular or cervical adenopathy. Normal sized without goiter. Respiratory WNL. No retractions.. Cardiovascular Pedal Pulses WNL. No clubbing, cyanosis or edema. Lymphatic No adneopathy. No adenopathy. No adenopathy. Musculoskeletal Adexa without tenderness or enlargement.. Digits and nails w/o clubbing, cyanosis, infection, petechiae, ischemia, or inflammatory conditions.[name] Psychiatric Judgement and insight Intact.. No evidence of depression, anxiety, or agitation.. General Notes: After washing the 2 wounds on the right ankle and right fourth toe there look clean and will benefit from Prisma AG The left fourth toe required sharp debridement with a #3 curet and after doing so I have recommended silver alginate Integumentary (Hair, Skin) No suspicious lesions. No crepitus or fluctuance. No peri-wound warmth or erythema. No masses.. Wound #2 status is Open. Original cause of wound was Gradually Appeared. The wound is located on the Right Malleolus. The wound measures 0.4cm length x 0.6cm width x 0.1cm depth; 0.188cm^2 area and 0.019cm^3 volume. There is no tunneling or undermining noted. There is a large amount of serous drainage noted. The wound margin is flat and intact. There is no granulation within the wound bed. There is a large (67-100%) amount of necrotic tissue within the wound bed including Adherent Slough. The periwound skin appearance exhibited: Rubor, Erythema. The surrounding wound skin color is noted with erythema which  is circumferential. Periwound temperature was noted as No Abnormality. The periwound has tenderness on palpation. Wound #4 status is Open. Original cause of wound was Trauma. The wound is located on the Left Toe Fourth. The wound measures 2cm length x 1cm width x 0.1cm depth; 1.571cm^2 area and 0.157cm^3 volume. There is no tunneling or undermining noted. There is a large amount of serosanguineous drainage noted. The wound margin is distinct with the outline attached to the wound base. There is small (1-33%) red granulation within the wound bed. There is a large (67-100%) amount of necrotic tissue within the wound bed including Eschar and Adherent Slough. The periwound skin appearance exhibited: Maceration, Erythema. The surrounding wound skin color is noted with erythema which is circumferential. Periwound temperature was noted as No Abnormality. The periwound has tenderness on palpation.  Wound #5 status is Open. Original cause of wound was Gradually Appeared. The wound is located on the Right Toe Fourth. Jessica Donovan, Jessica Donovan ([id number]) The wound measures 0.7cm length x 1.7cm width x 0.1cm depth; 0.935cm^2 area and 0.093cm^3 volume. There is no tunneling or undermining noted. There is a large amount of serosanguineous drainage noted. The wound margin is distinct with the outline attached to the wound base. There is medium (34-66%) red granulation within the wound bed. There is a medium (34-66%) amount of necrotic tissue within the wound bed including Adherent Slough. The periwound skin appearance exhibited: Maceration. Periwound temperature was noted as No Abnormality. The periwound has tenderness on palpation. 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 There was a Skin/Subcutaneous Tissue Debridement ([id number]) debridement with total area of 2 sq cm performed by Evlyn Kanner, MD. with the following instrument(s): Curette to remove Viable and Non-Viable tissue/material including Exudate, Fibrin/Slough, and Subcutaneous after achieving pain control using Lidocaine 4% Topical Solution. A time out was conducted at 13:48, prior to the start of the procedure. A Minimum amount of bleeding was controlled with Pressure. The procedure was tolerated well with a pain level of 0 throughout and a pain level of 0 following the procedure. Post Debridement Measurements: 2cm length x 1.2cm width x 0.1cm depth; 0.188cm^3 volume. Character of Wound/Ulcer Post Debridement requires further debridement. Post procedure Diagnosis Wound #4: Same as Pre-Procedure Plan Wound Cleansing: Wound #2 Right Malleolus: Cleanse wound with mild soap and water May Shower, gently pat wound dry prior to applying new dressing. May shower with protection. Wound #5 Right Toe Fourth: Cleanse wound with mild soap and water May Shower, gently pat wound dry prior to applying new dressing. May shower with protection. Wound #4 Left Toe Fourth: Cleanse wound with mild soap and water May Shower, gently pat wound dry prior to applying new dressing. Jessica Donovan, Jessica Donovan ([id number]) May shower with protection. Anesthetic: Wound #2 Right Malleolus: Topical Lidocaine 4% cream applied to wound bed prior to debridement Wound #5 Right Toe Fourth: Topical Lidocaine 4% cream applied to wound bed prior to debridement Wound #4 Left Toe Fourth: Topical Lidocaine 4% cream applied to wound bed prior to debridement Skin Barriers/Peri-Wound Care: Wound #2 Right Malleolus: Skin Prep Antifungal cream Primary Wound  Dressing: Wound #2 Right Malleolus: Prisma Ag Wound #4 Left Toe Fourth: Silvercel Non-Adherent Wound #5 Right Toe Fourth: Prisma Ag Secondary Dressing: Wound #2 Right Malleolus: Dry Gauze Other - telfa island Wound #4 Left Toe Fourth: Dry Gauze Other - coverlet (band-aide) Wound #5 Right Toe Fourth: Dry Gauze Other - coverlet (band-aide) Dressing Change Frequency: Wound #2 Right Malleolus: Change dressing every other day. Wound #4 Left Toe Fourth: Change dressing every other day. Wound #5 Right Toe Fourth: Change dressing every other day. Follow-up Appointments: Wound #2 Right Malleolus: Return Appointment in 1 week. Wound #5 Right Toe Fourth: Return Appointment in 1 week. Wound #4 Left Toe Fourth: Return Appointment in 1 week. Edema Control: Wound #2 Right Malleolus: Elevate legs to the level of the heart and pump ankles as often as possible Wound #5  Right Toe Fourth: Elevate legs to the level of the heart and pump ankles as often as possible Wound #4 Left Toe Fourth: Elevate legs to the level of the heart and pump ankles as often as possible Additional Orders / Instructions: Wound #2 Right Malleolus: Increase protein intake. Activity as tolerated Wound #5 Right Toe Fourth: Increase protein intake. Activity as tolerated Wound #4 Left Toe Fourth: Increase protein intake. Activity as tolerated Jessica Donovan, Jessica Donovan ([id number]) After review of the new wound on her left fourth toe today, I have recommended: 1. Prisma AG to the right ankle and the right fourth toe 2. a bordered foam to her left lateral ankle and Silver alginate to the left fourth toe 3. good control of her diabetes mellitus 4. Adequate offloading of both these areas Electronic Signature(s) Signed: 10/21/2017 2:18:27 PM By: Evlyn Kanner MD, FACS Entered By: Evlyn Kanner on 10/21/2017 14:18:27 Jessica Donovan  ([id number]) -------------------------------------------------------------------------------- ROS/PFSH Details Patient Name: Jessica Donovan Date of Service: 10/21/2017 1:30 PM Medical Record Number: [id number] Patient Account Number: [id number] Date of Birth/Sex: 12-25-1945 (71 y.o. Female) Treating RN: Ashok Cordia, Debi Primary Care Provider: Garlon Hatchet Other Clinician: Referring Provider: Garlon Hatchet Treating Provider/Extender: Rudene Re in Treatment: 15 Information Obtained From Patient Wound History Do you currently have one or more open woundso Yes How many open wounds do you currently haveo 3 How have you been treating your wound(s) until nowo santyl Has your wound(s) ever healed and then re-openedo No Have you had any lab work done in the past montho No Have you tested positive for an antibiotic resistant organism (MRSA, VRE)o No Have you tested positive for osteomyelitis (bone infection)o No Have you had any tests for circulation on your legso No Have you had other problems associated with your woundso Infection Eyes Medical History: Negative for: Cataracts; Glaucoma; Optic Neuritis Ear/Nose/Mouth/Throat Medical History: Negative for: Chronic sinus problems/congestion; Middle ear problems Hematologic/Lymphatic Medical History: Negative for: Anemia; Hemophilia; Human Immunodeficiency Virus; Lymphedema; Sickle Cell Disease Respiratory Medical History: Positive for: Sleep Apnea Negative for: Aspiration; Asthma; Chronic Obstructive Pulmonary Disease (COPD); Pneumothorax; Tuberculosis Cardiovascular Medical History: Positive for: Coronary Artery Disease; Hypertension; Myocardial Infarction Gastrointestinal Medical History: Negative for: Cirrhosis ; Colitis; Crohnos; Hepatitis A; Hepatitis B; Hepatitis C Endocrine Medical History: Positive for: Type II Diabetes Time with diabetes: 20 Ohlrich, Darnella ([id number]) Treated with: Insulin Blood sugar  tested every day: No Blood sugar testing results: Breakfast: 178 Immunological Medical History: Negative for: Lupus Erythematosus; Raynaudos; Scleroderma Integumentary (Skin) Medical History: Positive for: History of pressure wounds Negative for: History of Burn Musculoskeletal Medical History: Positive for: Osteoarthritis Negative for: Gout; Rheumatoid Arthritis; Osteomyelitis Neurologic Medical History: Positive for: Neuropathy Negative for: Dementia; Quadriplegia; Paraplegia; Seizure Disorder Oncologic Medical History: Negative for: Received Chemotherapy; Received Radiation Psychiatric Medical History: Positive for: Confinement Anxiety Negative for: Anorexia/bulimia Immunizations Pneumococcal Vaccine: Received Pneumococcal Vaccination: No Implantable Devices Family and Social History Cancer: No; Diabetes: No; Heart Disease: No; Hereditary Spherocytosis: No; Hypertension: No; Kidney Disease: No; Lung Disease: No; Seizures: No; Stroke: Yes - Mother; Thyroid Problems: No; Tuberculosis: No; Never smoker; Marital Status - Single; Alcohol Use: Never; Drug Use: No History; Caffeine Use: Never; Financial Concerns: No; Food, Clothing or Shelter Needs: No; Support System Lacking: No; Transportation Concerns: No; Advanced Directives: No; Patient does not want information on Advanced Directives; Living Will: No Physician Affirmation I have reviewed and agree with the above information. Electronic Signature(s) Signed: 10/21/2017 4:34:23 PM By: Evlyn Kanner MD, FACS Signed: 10/21/2017 4:53:27 PM By: Alejandro Mulling  BANI, GIANFRANCESCO ([id number]) Entered By: Evlyn Kanner on 10/21/2017 14:16:29 Jessica Donovan ([id number]) -------------------------------------------------------------------------------- SuperBill Details Patient Name: Jessica Donovan Date of Service: 10/21/2017 Medical Record Number: [id number] Patient Account Number: [id number] Date of Birth/Sex: 25-Aug-1946 (71 y.o.  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 L03.115 Cellulitis of right lower limb L97.522 Non-pressure chronic ulcer of other part of left foot with fat layer exposed Facility Procedures CPT4 Code: 08657846 Description: 11042 - DEB SUBQ TISSUE 20 SQ CM/< ICD-10 Diagnosis Description E11.621 Type 2 diabetes mellitus with 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 part of left foot with fat Modifier: ed d layer exposed Quantity: 1 Physician Procedures CPT4 Code: [id number] Description: 11042 - WC PHYS SUBQ TISS 20 SQ CM ICD-10 Diagnosis Description E11.621 Type 2 diabetes mellitus with 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

## 2017-10-23 NOTE — Progress Notes (Signed)
Jessica Donovan, Jessica Donovan ([id number]) Visit Report for 10/21/2017 Arrival Information Details Patient Name: Jessica Donovan Date of Service: 10/21/2017 1:30 PM Medical Record Number: [id number] Patient Account Number: [id number] Date of Birth/Sex: 1946/09/19 (71 y.o. Female) Treating RN: Jessica Donovan Primary Care Jessica Donovan: Jessica Donovan Other Clinician: Referring Kalieb Freeland: Jessica Donovan Treating Jessica Donovan/Extender: Jessica Donovan in Treatment: 15 Visit Information History Since Last Visit All ordered tests and consults were completed: Donovan Patient Arrived: Jessica Donovan Added or deleted any medications: Donovan Arrival Time: 13:26 Any new allergies or adverse reactions: Donovan Accompanied By: self Had a fall or experienced change in Donovan Transfer Assistance: EasyPivot Patient activities of daily living that may affect Lift risk of falls: Patient Identification Verified: Yes Signs or symptoms of abuse/neglect since last visito Donovan Secondary Verification Process Yes Hospitalized since last visit: Donovan Completed: Has Dressing in Place as Prescribed: Yes Patient Requires Transmission-Based Donovan Precautions: Pain Present Now: Donovan Patient Has Alerts: Yes Patient Alerts: Patient on Blood Thinner ASA Electronic Signature(s) Signed: 10/21/2017 4:53:27 PM By: Jessica Donovan Entered By: Jessica Donovan on 10/21/2017 13:26:42 Jessica Donovan ([id number]) -------------------------------------------------------------------------------- Encounter Discharge Information Details Patient Name: Jessica Donovan Date of Service: 10/21/2017 1:30 PM Medical Record Number: [id number] Patient Account Number: [id number] Date of Birth/Sex: 08-24-1946 (71 y.o. Female) Treating RN: Jessica Donovan Primary Care Shakyla Nolley: Jessica Donovan Other Clinician: Referring Kayin Osment: Jessica Donovan Treating Sarya Linenberger/Extender: Jessica Donovan in Treatment: 15 Encounter Discharge Information Items Discharge Pain Level:  0 Discharge Condition: Stable Ambulatory Status: Ambulatory Discharge Destination: Home Transportation: Other Accompanied By: self Schedule Follow-up Appointment: Yes Medication Reconciliation completed and Donovan provided to Patient/Care Jaely Silman: Provided on Clinical Summary of Care: 10/21/2017 Form Type Recipient Paper Patient BG Electronic Signature(s) Signed: 10/21/2017 2:09:57 PM By: Jessica Donovan Entered By: Jessica Donovan on 10/21/2017 14:09:56 Jessica Donovan, Jessica Donovan ([id number]) -------------------------------------------------------------------------------- Lower Extremity Assessment Details Patient Name: Jessica Donovan Date of Service: 10/21/2017 1:30 PM Medical Record Number: [id number] Patient Account Number: [id number] Date of Birth/Sex: [birth year] (71 y.o. Female) Treating RN: Jessica Donovan Primary Care Dominique Ressel: Jessica Donovan Other Clinician: Referring Mireyah Chervenak: Jessica Donovan Treating Jamayah Myszka/Extender: Jessica Donovan in Treatment: 15 Vascular Assessment Pulses: Dorsalis Pedis Palpable: [Left:Yes] [Right:Yes] Posterior Tibial Extremity colors, hair growth, and conditions: Extremity Color: [Left:Normal] [Right:Normal] Temperature of Extremity: [Left:Warm] [Right:Warm] Capillary Refill: [Left:< 3 seconds] [Right:< 3 seconds] Electronic Signature(s) Signed: 10/21/2017 4:53:27 PM By: Jessica Donovan Entered By: Jessica Donovan on 10/21/2017 13:39:40 Jessica Donovan, Jessica Donovan ([id number]) -------------------------------------------------------------------------------- Multi Wound Chart Details Patient Name: Jessica Donovan Date of Service: 10/21/2017 1:30 PM Medical Record Number: [id number] Patient Account Number: [id number] Date of Birth/Sex: 12/14/1946 (71 y.o. Female) Treating RN: Jessica Donovan Primary Care Gonsalo Cuthbertson: Jessica Donovan Other Clinician: Referring Kendle Turbin: Jessica Donovan Treating Arayah Krouse/Extender: Jessica Donovan in Treatment:  15 Vital Signs Height(in): 66 Pulse(bpm): 83 Weight(lbs): 191 Blood Pressure(mmHg): 141/56 Body Mass Index(BMI): 31 Temperature(F): 98.5 Respiratory Rate 18 (breaths/min): Photos: [2:Donovan Photos] [4:Donovan Photos] [5:Donovan Photos] Wound Location: [2:Right Malleolus] [4:Left Toe Fourth] [5:Right Toe Fourth] Wounding Event: [2:Gradually Appeared] [4:Trauma] [5:Gradually Appeared] Primary Etiology: [2:Diabetic Wound/Ulcer of the Lower Extremity] [4:Trauma, Other] [5:Trauma, Other] Comorbid History: [2:Sleep Apnea, Coronary Artery Disease, Hypertension, Myocardial Infarction, Type II Diabetes, History of pressure wounds, Osteoarthritis, Neuropathy, Confinement Anxiety] [4:Sleep Apnea, Coronary Artery Disease, Hypertension,  Myocardial Infarction, Type II Diabetes, History of pressure wounds, Osteoarthritis, Neuropathy, Confinement Anxiety] [5:Sleep Apnea, Coronary Artery Disease, Hypertension, Myocardial Infarction, Type II Diabetes, History of pressure wounds,  Osteoarthritis, Neuropathy, Confinement Anxiety] Date Acquired: [2:02/17/2017] [4:10/12/2017] [5:10/12/2017] Weeks of Treatment: [2:15] [4:1] [5:0] Wound Status: [2:Open] [4:Open] [5:Open]  Measurements L x W x D [2:0.4x0.6x0.1] [4:2x1x0.1] [5:0.7x1.7x0.1] (cm) Area (cm) : [2:0.188] [4:1.571] [5:0.935] Volume (cm) : [2:0.019] [4:0.157] [5:0.093] % Reduction in Area: [2:84.70%] [4:-127.40%] [5:N/A] % Reduction in Volume: [2:84.60%] [4:-127.50%] [5:N/A] Classification: [2:Grade 1] [4:Partial Thickness] [5:Partial Thickness] Exudate Amount: [2:Large] [4:Large] [5:Large] Exudate Type: [2:Serous] [4:Serosanguineous] [5:Serosanguineous] Exudate Color: [2:amber] [4:red, brown] [5:red, brown] Wound Margin: [2:Flat and Intact] [4:Distinct, outline attached] [5:Distinct, outline attached] Granulation Amount: [2:None Present (0%)] [4:Small (1-33%)] [5:Medium (34-66%)] Granulation Quality: [2:N/A] [4:Red] [5:Red] Necrotic Amount: [2:Large  (67-100%)] [4:Large (67-100%)] [5:Medium (34-66%)] Necrotic Tissue: [2:Adherent Slough] [4:Eschar, Adherent Slough] [5:Adherent Slough] Epithelialization: [2:None] [4:None] [5:Small (1-33%)] Debridement: [2:N/A] [4:Debridement (11042-11047)] [5:N/A] Pre-procedure [2:N/A] [4:13:48] [5:N/A] Verification/Time Out Taken: Pain Control: [2:N/A] [4:Lidocaine 4% Topical Solution N/A] Tissue Debrided: [2:N/A] [4:Fibrin/Slough, Exudates, Subcutaneous] [5:N/A] Level: N/A Skin/Subcutaneous Tissue N/A Debridement Area (sq cm): N/A 2 N/A Instrument: N/A Curette N/A Bleeding: N/A Minimum N/A Hemostasis Achieved: N/A Pressure N/A Procedural Pain: N/A 0 N/A Post Procedural Pain: N/A 0 N/A Debridement Treatment N/A Procedure was tolerated well N/A Response: Post Debridement N/A 2x1.2x0.1 N/A Measurements L x W x D (cm) Post Debridement Volume: N/A 0.188 N/A (cm) Periwound Skin Texture: Donovan Abnormalities Noted Donovan Abnormalities Noted Donovan Abnormalities Noted Periwound Skin Moisture: Donovan Abnormalities Noted Maceration: Yes Maceration: Yes Periwound Skin Color: Erythema: Yes Erythema: Yes Donovan Abnormalities Noted Rubor: Yes Erythema Location: Circumferential Circumferential N/A Temperature: Donovan Abnormality Donovan Abnormality Donovan Abnormality Tenderness on Palpation: Yes Yes Yes Wound Preparation: Ulcer Cleansing: Ulcer Cleansing: Ulcer Cleansing: Rinsed/Irrigated with Saline Rinsed/Irrigated with Saline Rinsed/Irrigated with Saline Topical Anesthetic Applied: Topical Anesthetic Applied: Topical Anesthetic Applied: Other: lidocaine 4% Other: lidocaine 4% Other: lidocaine 4% Procedures Performed: N/A Debridement N/A Treatment Notes Wound #2 (Right Malleolus) 1. Cleansed with: Clean wound with Normal Saline 2. Anesthetic Topical Lidocaine 4% cream to wound bed prior to debridement 4. Dressing Applied: Prisma Ag 5. Secondary Dressing Applied [place] Wound #4 (Left Toe Fourth) 1. Cleansed  with: Clean wound with Normal Saline 2. Anesthetic Topical Lidocaine 4% cream to wound bed prior to debridement 4. Dressing Applied: Other dressing (specify in notes) 5. Secondary Dressing Applied Dry Gauze Notes coverlet, silvercel Wound #5 (Right Toe Fourth) 1. Cleansed with: Clean wound with Normal Saline Jessica Donovan, Jessica Donovan ([id number]) 2. Anesthetic Topical Lidocaine 4% cream to wound bed prior to debridement 4. Dressing Applied: Prisma Ag 5. Secondary Dressing Applied Dry Gauze Notes coverlet Electronic Signature(s) Signed: 10/21/2017 2:15:54 PM By: Evlyn KannerBritto, Errol MD, FACS Entered By: Evlyn KannerBritto, Errol on 10/21/2017 14:15:54 Jessica DanceGLASS, Fredi ([id number]) -------------------------------------------------------------------------------- Multi-Disciplinary Care Plan Details Patient Name: Jessica DanceGLASS, Kana Date of Service: 10/21/2017 1:30 PM Medical Record Number: [id number] Patient Account Number: [id number] Date of Birth/Sex: 1946/05/31 (71 y.o. Female) Treating RN: Jessica CordiaPinkerton, Donovan Primary Care Raynelle Fujikawa: Jessica HatchetMCCONVILLE, ROBERT Other Clinician: Referring Charley Miske: Jessica HatchetMCCONVILLE, ROBERT Treating Yuritzy Zehring/Extender: Jessica ReBritto, Errol Weeks in Treatment: 15 Active Inactive ` Orientation to the Wound Care Program Nursing Diagnoses: Knowledge deficit related to the wound healing center program Goals: Patient/caregiver will verbalize understanding of the Wound Healing Center Program Date Initiated: 07/08/2017 Target Resolution Date: 10/08/2017 Goal Status: Active Interventions: Provide education on orientation to the wound center Notes: ` Peripheral Neuropathy Nursing Diagnoses: Knowledge deficit related to disease process and management of peripheral neurovascular dysfunction Potential alteration in peripheral tissue perfusion (select prior to confirmation of diagnosis) Goals: Patient/caregiver will verbalize understanding of disease process and disease management Date Initiated:  07/08/2017 Target Resolution Date: 10/08/2017 Goal Status: Active Interventions: Assess signs and symptoms of neuropathy upon admission and as needed  Provide education on Management of Neuropathy and Related Ulcers Provide education on Management of Neuropathy upon discharge from the Wound Center Treatment Activities: Patient referred for customized footwear/offloading : 07/08/2017 Patient referred to diabetes educator : 07/08/2017 Notes: ` Wound/Skin Impairment Nursing Diagnoses: Impaired tissue integrity Jessica Donovan, Jessica Donovan ([id number]) Knowledge deficit related to ulceration/compromised skin integrity Goals: Patient/caregiver will verbalize understanding of skin care regimen Date Initiated: 07/08/2017 Target Resolution Date: 10/08/2017 Goal Status: Active Ulcer/skin breakdown will have a volume reduction of 30% by week 4 Date Initiated: 07/08/2017 Target Resolution Date: 10/08/2017 Goal Status: Active Ulcer/skin breakdown will have a volume reduction of 50% by week 8 Date Initiated: 07/08/2017 Target Resolution Date: 10/08/2017 Goal Status: Active Ulcer/skin breakdown will have a volume reduction of 80% by week 12 Date Initiated: 07/08/2017 Target Resolution Date: 10/08/2017 Goal Status: Active Ulcer/skin breakdown will heal within 14 weeks Date Initiated: 07/08/2017 Target Resolution Date: 10/08/2017 Goal Status: Active Interventions: Assess patient/caregiver ability to obtain necessary supplies Assess patient/caregiver ability to perform ulcer/skin care regimen upon admission and as needed Assess ulceration(s) every visit Treatment Activities: Referred to DME Ronia Hazelett for dressing supplies : 07/08/2017 Skin care regimen initiated : 07/08/2017 Topical wound management initiated : 07/08/2017 Notes: Electronic Signature(s) Signed: 10/21/2017 4:53:27 PM By: Jessica Donovan Entered By: Jessica Donovan on 10/21/2017 13:46:20 Schwabe, Jessica Donovan  ([id number]) -------------------------------------------------------------------------------- Pain Assessment Details Patient Name: Jessica Donovan Date of Service: 10/21/2017 1:30 PM Medical Record Number: [id number] Patient Account Number: [id number] Date of Birth/Sex: 04/17/1946 (71 y.o. Female) Treating RN: Phillis Haggis Primary Care Lahari Suttles: Jessica Donovan Other Clinician: Referring Genasis Zingale: Jessica Donovan Treating Charish Schroepfer/Extender: Jessica Donovan in Treatment: 15 Active Problems Location of Pain Severity and Description of Pain Patient Has Paino Donovan Site Locations Pain Management and Medication Current Pain Management: Electronic Signature(s) Signed: 10/21/2017 4:53:27 PM By: Jessica Donovan Entered By: Jessica Donovan on 10/21/2017 13:26:47 Jessica Donovan ([id number]) -------------------------------------------------------------------------------- Patient/Caregiver Education Details Patient Name: Jessica Donovan Date of Service: 10/21/2017 1:30 PM Medical Record Number: [id number] Patient Account Number: [id number] Date of Birth/Gender: 05-Jun-1946 (71 y.o. Female) Treating RN: Phillis Haggis Primary Care Physician: Jessica Donovan Other Clinician: Referring Physician: Garlon Donovan Treating Physician/Extender: Jessica Donovan in Treatment: 15 Education Assessment Education Provided To: Patient Education Topics Provided Wound/Skin Impairment: Handouts: Other: change dressing as ordered Methods: Demonstration, Explain/Verbal Responses: State content correctly Electronic Signature(s) Signed: 10/21/2017 4:53:27 PM By: Jessica Donovan Entered By: Jessica Donovan on 10/21/2017 14:10:16 Aguayo, Jessica Donovan ([id number]) -------------------------------------------------------------------------------- Wound Assessment Details Patient Name: Jessica Donovan Date of Service: 10/21/2017 1:30 PM Medical Record Number: [id number] Patient Account Number:  [id number] Date of Birth/Sex: [birth year] (71 y.o. Female) Treating RN: Jessica Donovan Primary Care Jaquavian Firkus: Jessica Donovan Other Clinician: Referring Alan Drummer: Jessica Donovan Treating Prabhav Faulkenberry/Extender: Jessica Donovan in Treatment: 15 Wound Status Wound Number: 2 Primary Diabetic Wound/Ulcer of the Lower Extremity Etiology: Wound Location: Right Malleolus Wound Open Wounding Event: Gradually Appeared Status: Date Acquired: 02/17/2017 Comorbid Sleep Apnea, Coronary Artery Disease, Weeks Of Treatment: 15 History: Hypertension, Myocardial Infarction, Type II Clustered Wound: Donovan Diabetes, History of pressure wounds, Osteoarthritis, Neuropathy, Confinement Anxiety Photos Photo Uploaded By: Jessica Donovan on 10/21/2017 14:25:31 Wound Measurements Length: (cm) 0.4 Width: (cm) 0.6 Depth: (cm) 0.1 Area: (cm) 0.188 Volume: (cm) 0.019 % Reduction in Area: 84.7% % Reduction in Volume: 84.6% Epithelialization: None Tunneling: Donovan Undermining: Donovan Wound Description Classification: Grade 1 Wound Margin: Flat and Intact Exudate Amount: Large Exudate Type: Serous Exudate Color: amber Foul Odor After Cleansing: Donovan Slough/Fibrino Yes Wound Bed Granulation Amount: None Present (0%) Necrotic  Amount: Large (67-100%) Necrotic Quality: Adherent Slough Periwound Skin Texture Texture Color Donovan Abnormalities Noted: Donovan Donovan Abnormalities Noted: Donovan Erythema: Yes Moisture Perdue, Zareah ([id number]) Donovan Abnormalities Noted: Donovan Erythema Location: Circumferential Rubor: Yes Temperature / Pain Temperature: Donovan Abnormality Tenderness on Palpation: Yes Wound Preparation Ulcer Cleansing: Rinsed/Irrigated with Saline Topical Anesthetic Applied: Other: lidocaine 4%, Treatment Notes Wound #2 (Right Malleolus) 1. Cleansed with: Clean wound with Normal Saline 2. Anesthetic Topical Lidocaine 4% cream to wound bed prior to debridement 4. Dressing Applied: Prisma Ag 5. Secondary  Dressing Applied [place] Electronic Signature(s) Signed: 10/21/2017 4:53:27 PM By: Jessica Donovan Entered By: Jessica Donovan on 10/21/2017 13:37:04 Brathwaite, Jessica Donovan ([id number]) -------------------------------------------------------------------------------- Wound Assessment Details Patient Name: Jessica Donovan Date of Service: 10/21/2017 1:30 PM Medical Record Number: [id number] Patient Account Number: [id number] Date of Birth/Sex: 01/24/1946 (71 y.o. Female) Treating RN: Jessica Donovan Primary Care Senora Lacson: Jessica Donovan Other Clinician: Referring Keeli Roberg: Jessica Donovan Treating Denice Cardon/Extender: Jessica Donovan in Treatment: 15 Wound Status Wound Number: 4 Primary Trauma, Other Etiology: Wound Location: Left Toe Fourth Wound Open Wounding Event: Trauma Status: Date Acquired: 10/12/2017 Comorbid Sleep Apnea, Coronary Artery Disease, Weeks Of Treatment: 1 History: Hypertension, Myocardial Infarction, Type II Clustered Wound: Donovan Diabetes, History of pressure wounds, Osteoarthritis, Neuropathy, Confinement Anxiety Photos Photo Uploaded By: Jessica Donovan on 10/21/2017 14:25:32 Wound Measurements Length: (cm) 2 Width: (cm) 1 Depth: (cm) 0.1 Area: (cm) 1.571 Volume: (cm) 0.157 % Reduction in Area: -127.4% % Reduction in Volume: -127.5% Epithelialization: None Tunneling: Donovan Undermining: Donovan Wound Description Classification: Partial Thickness Wound Margin: Distinct, outline attached Exudate Amount: Large Exudate Type: Serosanguineous Exudate Color: red, brown Foul Odor After Cleansing: Donovan Slough/Fibrino Yes Wound Bed Granulation Amount: Small (1-33%) Exposed Structure Granulation Quality: Red Fascia Exposed: Donovan Necrotic Amount: Large (67-100%) Fat Layer (Subcutaneous Tissue) Exposed: Donovan Necrotic Quality: Eschar, Adherent Slough Tendon Exposed: Donovan Muscle Exposed: Donovan Joint Exposed: Donovan Bone Exposed: Donovan Jessica Donovan, Jessica Donovan  ([id number]) Periwound Skin Texture Texture Color Donovan Abnormalities Noted: Donovan Donovan Abnormalities Noted: Donovan Erythema: Yes Moisture Erythema Location: Circumferential Donovan Abnormalities Noted: Donovan Maceration: Yes Temperature / Pain Temperature: Donovan Abnormality Tenderness on Palpation: Yes Wound Preparation Ulcer Cleansing: Rinsed/Irrigated with Saline Topical Anesthetic Applied: Other: lidocaine 4%, Treatment Notes Wound #4 (Left Toe Fourth) 1. Cleansed with: Clean wound with Normal Saline 2. Anesthetic Topical Lidocaine 4% cream to wound bed prior to debridement 4. Dressing Applied: Other dressing (specify in notes) 5. Secondary Dressing Applied Dry Gauze Notes coverlet, silvercel Electronic Signature(s) Signed: 10/21/2017 4:53:27 PM By: Jessica Donovan Entered By: Jessica Donovan on 10/21/2017 13:36:34 Jessica Donovan, Jessica Donovan ([id number]) -------------------------------------------------------------------------------- Wound Assessment Details Patient Name: Jessica Donovan Date of Service: 10/21/2017 1:30 PM Medical Record Number: [id number] Patient Account Number: [id number] Date of Birth/Sex: 1946/06/02 (71 y.o. Female) Treating RN: Jessica Donovan Primary Care Chassie Pennix: Jessica Donovan Other Clinician: Referring Madisan Bice: Jessica Donovan Treating Ceilidh Torregrossa/Extender: Jessica Donovan in Treatment: 15 Wound Status Wound Number: 5 Primary Trauma, Other Etiology: Wound Location: Right Toe Fourth Wound Open Wounding Event: Gradually Appeared Status: Date Acquired: 10/12/2017 Comorbid Sleep Apnea, Coronary Artery Disease, Weeks Of Treatment: 0 History: Hypertension, Myocardial Infarction, Type II Clustered Wound: Donovan Diabetes, History of pressure wounds, Osteoarthritis, Neuropathy, Confinement Anxiety Photos Photo Uploaded By: Jessica Donovan on 10/21/2017 14:26:16 Wound Measurements Length: (cm) 0.7 Width: (cm) 1.7 Depth: (cm) 0.1 Area: (cm) 0.935 Volume: (cm)  0.093 % Reduction in Area: % Reduction in Volume: Epithelialization: Small (1-33%) Tunneling: Donovan Undermining: Donovan Wound Description Classification: Partial Thickness Wound Margin: Distinct, outline attached Exudate  Amount: Large Exudate Type: Serosanguineous Exudate Color: red, brown Foul Odor After Cleansing: Donovan Slough/Fibrino Yes Wound Bed Granulation Amount: Medium (34-66%) Granulation Quality: Red Necrotic Amount: Medium (34-66%) Necrotic Quality: Adherent Slough Periwound Skin Texture Texture Color Donovan Abnormalities Noted: Donovan Donovan Abnormalities Noted: Donovan Jessica Donovan, Jessica Donovan ([id number]) Moisture Temperature / Pain Donovan Abnormalities Noted: Donovan Temperature: Donovan Abnormality Maceration: Yes Tenderness on Palpation: Yes Wound Preparation Ulcer Cleansing: Rinsed/Irrigated with Saline Topical Anesthetic Applied: Other: lidocaine 4%, Treatment Notes Wound #5 (Right Toe Fourth) 1. Cleansed with: Clean wound with Normal Saline 2. Anesthetic Topical Lidocaine 4% cream to wound bed prior to debridement 4. Dressing Applied: Prisma Ag 5. Secondary Dressing Applied Dry Gauze Notes coverlet Electronic Signature(s) Signed: 10/21/2017 4:53:27 PM By: Jessica Donovan Entered By: Jessica Donovan on 10/21/2017 13:35:35 Daniello, Jessica Donovan ([id number]) -------------------------------------------------------------------------------- Vitals Details Patient Name: Jessica Donovan Date of Service: 10/21/2017 1:30 PM Medical Record Number: [id number] Patient Account Number: [id number] Date of Birth/Sex: Jun 30, 1946 (71 y.o. Female) Treating RN: Jessica Donovan Primary Care Marijo Quizon: Jessica Donovan Other Clinician: Referring Divante Kotch: Jessica Donovan Treating Jerusalem Wert/Extender: Jessica Donovan in Treatment: 15 Vital Signs Time Taken: 13:26 Temperature (F): 98.5 Height (in): 66 Pulse (bpm): 83 Weight (lbs): 191 Respiratory Rate (breaths/min): 18 Body Mass Index (BMI): 30.8 Blood  Pressure (mmHg): 141/56 Reference Range: 80 - 120 mg / dl Electronic Signature(s) Signed: 10/21/2017 4:53:27 PM By: Jessica Donovan Entered By: Jessica Donovan on 10/21/2017 13:28:52

## 2017-10-25 ENCOUNTER — Ambulatory Visit: Payer: PPO | Admitting: Podiatry

## 2017-10-28 ENCOUNTER — Encounter: Payer: PPO | Admitting: Surgery

## 2017-10-28 DIAGNOSIS — E11622 Type 2 diabetes mellitus with other skin ulcer: Secondary | ICD-10-CM | POA: Diagnosis not present

## 2017-10-28 DIAGNOSIS — E11621 Type 2 diabetes mellitus with foot ulcer: Secondary | ICD-10-CM | POA: Diagnosis not present

## 2017-10-28 DIAGNOSIS — S91105A Unspecified open wound of left lesser toe(s) without damage to nail, initial encounter: Secondary | ICD-10-CM | POA: Diagnosis not present

## 2017-10-28 DIAGNOSIS — L97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 layer exposed: Secondary | ICD-10-CM | POA: Diagnosis not present

## 2017-10-28 DIAGNOSIS — L97512 Non-pressure chronic ulcer of other part of right foot with fat layer exposed: Secondary | ICD-10-CM | POA: Diagnosis not present

## 2017-10-31 NOTE — Progress Notes (Signed)
Jessica Donovan, Jessica Donovan ([id number]) Visit Report for 10/28/2017 Chief Complaint Document Details Patient Name: Jessica Donovan, Jessica Donovan Date of Service: 10/28/2017 2:15 PM Medical Record Number: [id number] Patient Account Number: [id number] Date of Birth/Sex: 1946/05/02 (71 y.o. Female) Treating RN: Phillis Haggis Primary Care Provider: Garlon Hatchet Other Clinician: Referring Provider: Garlon Hatchet Treating Provider/Extender: Rudene Re in Treatment: 16 Information Obtained from: Patient Chief Complaint Patients presents for treatment of an open diabetic ulcer to the right ankle the left ankle and the right third toe Electronic Signature(s) Signed: 10/28/2017 3:13:22 PM By: Evlyn Kanner MD, FACS Entered By: Evlyn Kanner on 10/28/2017 15:13:21 Jessica Donovan ([id number]) -------------------------------------------------------------------------------- HPI Details Patient Name: Jessica Donovan Date of Service: 10/28/2017 2:15 PM Medical Record Number: [id number] Patient Account Number: [id number] Date of Birth/Sex: June 15, 1946 (71 y.o. Female) Treating RN: Phillis Haggis Primary Care Provider: Garlon Hatchet Other Clinician: Referring Provider: Garlon Hatchet Treating Provider/Extender: Rudene Re in Treatment: 16 History of Present Illness HPI Description: 71 year old diabetic patient who has chronic back pain, collagen vascular disease, coronary artery disease, hypertension and history of osteomyelitis of the toe in February 2017. 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 She also has had joint replacement surgery in the past. She was recently reviewed by Primary care management team and had a problem with the toe on her right foot and her left ankle had minimal drainage, and the right ankle was draining worse. She was seen in the ER on 06/22/2017, and on the x-ray, there was no evidence of any acute bony abnormality. 07/22/2017 -- she  says she had a mild abrasion to her right second toe which was inadvertent but there is no open ulceration. 07/30/17 on evaluation today patient appears to be doing better in regard to her abrasion to the right second toe. It appears there's little bit of a pressure injury although there is no opening at this point. I think this is pushing up on her shoe which is causing the issue at this point and that's due to the contraction that happening with her toe. With that being said patient wonders if there's anything that can be done in that regard for this. She has been tolerating the dressing changes without any complication and is having really no pain. Her right lateral heel wound appears to be slough covered though there is no evidence of infection. No fevers, chills, nausea, or vomiting noted at this time. 08/09/2017 -- since last week her leg has gotten quite red and painful and she was concerned about the possibility of infection. 10/15/2017 -- she had a fall a couple of times last week and hurt her left toe. Electronic Signature(s) Signed: 10/28/2017 3:13:26 PM By: Evlyn Kanner MD, FACS Entered By: Evlyn Kanner on 10/28/2017 15:13:26 Jessica Donovan ([id number]) -------------------------------------------------------------------------------- Physical Exam Details Patient Name: Jessica Donovan Date of Service: 10/28/2017 2:15 PM Medical Record Number: [id number] Patient Account Number: [id number] Date of Birth/Sex: 24-Oct-1946 (71 y.o. Female) Treating RN: Phillis Haggis Primary Care Provider: Garlon Hatchet Other Clinician: Referring Provider: Garlon Hatchet Treating Provider/Extender: Rudene Re in Treatment: 16 Constitutional . Pulse regular. Respirations normal and unlabored. Afebrile. . Eyes Nonicteric. Reactive to light. Ears, Nose, Mouth, and Throat Lips, teeth, and gums WNL.[name] Moist mucosa without lesions. Neck supple and nontender. No palpable supraclavicular or  cervical adenopathy. Normal sized without goiter. Respiratory WNL. No retractions.. Cardiovascular Pedal Pulses WNL. No clubbing, cyanosis or edema. Lymphatic No adneopathy. No adenopathy. No adenopathy. Musculoskeletal Adexa without tenderness or enlargement.[name]  Digits and nails w/o clubbing, cyanosis, infection, petechiae, ischemia, or inflammatory conditions.. Integumentary (Hair, Skin) No suspicious lesions. No crepitus or fluctuance. No peri-wound warmth or erythema. No masses.[name] Psychiatric Judgement and insight Intact.. No evidence of depression, anxiety, or agitation.. Notes the wounds look very good today and no sharp debridement was required. Electronic Signature(s) Signed: 10/28/2017 3:14:00 PM By: Evlyn Kanner MD, FACS Entered By: Evlyn Kanner on 10/28/2017 15:14:00 Jessica Donovan ([id number]) -------------------------------------------------------------------------------- Physician Orders Details Patient Name: Jessica Donovan Date of Service: 10/28/2017 2:15 PM Medical Record Number: [id number] Patient Account Number: [id number] Date of Birth/Sex: Jun 10, 1946 (71 y.o. Female) Treating RN: Ashok Cordia, Debi Primary Care Provider: Garlon Hatchet Other Clinician: Referring Provider: Garlon Hatchet Treating Provider/Extender: Rudene Re in Treatment: 40 Verbal / Phone Orders: Yes Clinician: Ashok Cordia, Debi Read Back and Verified: Yes Diagnosis Coding Wound Cleansing Wound #2 Right Malleolus o Cleanse wound with mild soap and water o May Shower, gently pat wound dry prior to applying new dressing. o May shower with protection. Wound #4 Left Toe Fourth o Cleanse wound with mild soap and water o May Shower, gently pat wound dry prior to applying new dressing. o May shower with protection. Wound #5 Right Toe Fourth o Cleanse wound with mild soap and water o May Shower, gently pat wound dry prior to applying new dressing. o May shower with  protection. Anesthetic Wound #2 Right Malleolus o Topical Lidocaine 4% cream applied to wound bed prior to debridement Wound #4 Left Toe Fourth o Topical Lidocaine 4% cream applied to wound bed prior to debridement Wound #5 Right Toe Fourth o Topical Lidocaine 4% cream applied to wound bed prior to debridement Skin Barriers/Peri-Wound Care Wound #2 Right Malleolus o Skin Prep o Antifungal cream Primary Wound Dressing Wound #2 Right Malleolus o Prisma Ag Wound #4 Left Toe Fourth o Aquacel Ag Secondary Dressing Wound #2 Right Malleolus o Dry Gauze o Other - telfa island Jessica Donovan, Jessica Donovan ([id number]) Wound #4 Left Toe Fourth o Dry Gauze o Other - coverlet (band-aide) Wound #5 Right Toe Fourth o Dry Gauze o Foam o Other - coverlet (band-aide) Dressing Change Frequency Wound #2 Right Malleolus o Change dressing every other day. Wound #4 Left Toe Fourth o Change dressing every other day. Wound #5 Right Toe Fourth o Change dressing every other day. Follow-up Appointments Wound #2 Right Malleolus o Return Appointment in 1 week. Wound #4 Left Toe Fourth o Return Appointment in 1 week. Wound #5 Right Toe Fourth o Return Appointment in 1 week. Edema Control Wound #2 Right Malleolus o Elevate legs to the level of the heart and pump ankles as often as possible Wound #4 Left Toe Fourth o Elevate legs to the level of the heart and pump ankles as often as possible Wound #5 Right Toe Fourth o Elevate legs to the level of the heart and pump ankles as often as possible Additional Orders / Instructions Wound #2 Right Malleolus o Increase protein intake. o Activity as tolerated Wound #4 Left Toe Fourth o Increase protein intake. o Activity as tolerated Wound #5 Right Toe Fourth o Increase protein intake. o Activity as tolerated Electronic Signature(s) Jessica Donovan, Jessica Donovan ([id number]) Signed: 10/28/2017 4:32:33 PM By: Evlyn Kanner MD, FACS Signed: 10/29/2017 4:36:47 PM By: Alejandro Mulling Entered By: Alejandro Mulling on 10/28/2017 14:43:57 Jessica Donovan, Jessica Donovan ([id number]) -------------------------------------------------------------------------------- Problem List Details Patient Name: Jessica Donovan Date of Service: 10/28/2017 2:15 PM Medical Record Number: [id number] Patient Account Number: [id number] Date of Birth/Sex: [birth year] (71 y.o. Female) Treating RN: Ashok Cordia,  Debi Primary Care Provider: Garlon HatchetMCCONVILLE, ROBERT Other Clinician: Referring Provider: Garlon HatchetMCCONVILLE, ROBERT Treating Provider/Extender: Rudene ReBritto, Bhavik Cabiness Weeks in Treatment: 16 Active Problems ICD-10 Encounter Code Description Active Date Diagnosis E11.621 Type 2 diabetes mellitus with foot ulcer 07/08/2017 Yes L97.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 layer exposed 07/08/2017 Yes L97.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 left ankle with fat layer exposed 07/08/2017 Yes L03.115 Cellulitis of right lower limb 08/09/2017 Yes L97.522 Non-pressure chronic ulcer of other part of left foot with fat layer 10/14/2017 Yes exposed Inactive Problems Resolved Problems Electronic Signature(s) Signed: 10/28/2017 3:13:08 PM By: Evlyn KannerBritto, Randee Huston MD, FACS Entered By: Evlyn KannerBritto, Deionna Marcantonio on 10/28/2017 15:13:08 Jessica Donovan, Jessica Donovan ([id number]) -------------------------------------------------------------------------------- Progress Note Details Patient Name: Jessica Donovan, Jessica Donovan Date of Service: 10/28/2017 2:15 PM Medical Record Number: [id number] Patient Account Number: [id number] Date of Birth/Sex: Sep 27, 1946 4(71 y.o. Female) Treating RN: Ashok CordiaPinkerton, Debi Primary Care Provider: Garlon HatchetMCCONVILLE, ROBERT Other Clinician: Referring Provider: Garlon HatchetMCCONVILLE, ROBERT Treating Provider/Extender: Rudene ReBritto, Veryl Winemiller Weeks in Treatment: 16 Subjective Chief Complaint Information obtained from Patient Patients presents for treatment of an open diabetic ulcer to the right ankle the left ankle and the  right third toe History of Present Illness (HPI) 71 year old diabetic patient who has chronic back pain, collagen vascular disease, coronary artery disease, hypertension and history of osteomyelitis of the toe in February 2017. 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 She also has had joint replacement surgery in the past. She was recently reviewed by Primary care management team and had a problem with the toe on her right foot and her left ankle had minimal drainage, and the right ankle was draining worse. She was seen in the ER on 06/22/2017, and on the x-ray, there was no evidence of any acute bony abnormality. 07/22/2017 -- she says she had a mild abrasion to her right second toe which was inadvertent but there is no open ulceration. 07/30/17 on evaluation today patient appears to be doing better in regard to her abrasion to the right second toe. It appears there's little bit of a pressure injury although there is no opening at this point. I think this is pushing up on her shoe which is causing the issue at this point and that's due to the contraction that happening with her toe. With that being said patient wonders if there's anything that can be done in that regard for this. She has been tolerating the dressing changes without any complication and is having really no pain. Her right lateral heel wound appears to be slough covered though there is no evidence of infection. No fevers, chills, nausea, or vomiting noted at this time. 08/09/2017 -- since last week her leg has gotten quite red and painful and she was concerned about the possibility of infection. 10/15/2017 -- she had a fall a couple of times last week and hurt her left toe. Patient History Information obtained from Patient. Family History Stroke - Mother, No family history of Cancer, Diabetes, Heart Disease, Hereditary Spherocytosis, Hypertension, Kidney Disease, Lung Disease, Seizures, Thyroid  Problems, Tuberculosis. Social History Never smoker, Marital Status - Single, Alcohol Use - Never, Drug Use - No History, Caffeine Use - Never. Jessica Donovan, Jessica Donovan ([id number]) Objective Constitutional Pulse regular. Respirations normal and unlabored. Afebrile. Vitals Time Taken: 2:11 PM, Height: 66 in, Weight: 191 lbs, BMI: 30.8, Temperature: 98.7 F, Pulse: 67 bpm, Respiratory Rate: 18 breaths/min, Blood Pressure: 110/54 mmHg. Eyes Nonicteric. Reactive to light. Ears, Nose, Mouth, and Throat Lips, teeth, and gums WNL.[name]. Moist mucosa  without lesions. Neck supple and nontender. No palpable supraclavicular or cervical adenopathy. Normal sized without goiter. Respiratory WNL. No retractions.. Cardiovascular Pedal Pulses WNL. No clubbing, cyanosis or edema. Lymphatic No adneopathy. No adenopathy. No adenopathy. Musculoskeletal Adexa without tenderness or enlargement.. Digits and nails w/o clubbing, cyanosis, infection, petechiae, ischemia, or inflammatory conditions.[name]. Psychiatric Judgement and insight Intact.. No evidence of depression, anxiety, or agitation.. General Notes: the wounds look very good today and no sharp debridement was required. Integumentary (Hair, Skin) No suspicious lesions. No crepitus or fluctuance. No peri-wound warmth or erythema. No masses.. Wound #2 status is Open. Original cause of wound was Gradually Appeared. The wound is located on the Right Malleolus. The wound measures 0.3cm length x 0.5cm width x 0.1cm depth; 0.118cm^2 area and 0.012cm^3 volume. There is no tunneling or undermining noted. There is a large amount of serous drainage noted. The wound margin is flat and intact. There is large (67-100%) red granulation within the wound bed. There is a small (1-33%) amount of necrotic tissue within the wound bed including Adherent Slough. The periwound skin appearance exhibited: Rubor, Erythema. The surrounding wound skin color is noted with erythema which is  circumferential. Periwound temperature was noted as No Abnormality. The periwound has tenderness on palpation. Wound #4 status is Open. Original cause of wound was Trauma. The wound is located on the Left Toe Fourth. The wound measures 1.8cm length x 1.2cm width x 0.1cm depth; 1.696cm^2 area and 0.17cm^3 volume. There is no tunneling or undermining noted. There is a large amount of serosanguineous drainage noted. The wound margin is distinct with the outline attached to the wound base. There is small (1-33%) red granulation within the wound bed. There is a large (67-100%) amount of necrotic tissue within the wound bed including Eschar and Adherent Slough. The periwound skin appearance exhibited: Maceration, Erythema. The surrounding wound skin color is noted with erythema which is circumferential. Periwound temperature was noted as No Abnormality. The periwound has tenderness on palpation. Wound #5 status is Open. Original cause of wound was Gradually Appeared. The wound is located on the Right Toe Fourth. The wound measures 0.1cm length x 0.1cm width x 0.1cm depth; 0.008cm^2 area and 0.001cm^3 volume. There is no tunneling or undermining noted. There is a medium amount of serosanguineous drainage noted. The wound margin is distinct Knippenberg, Adonia ([id number]) with the outline attached to the wound base. There is large (67-100%) red granulation within the wound bed. There is no necrotic tissue within the wound bed. The periwound skin appearance did not exhibit: Maceration. Periwound temperature was noted as No Abnormality. The periwound has tenderness on palpation. Assessment Active Problems ICD-10 E11.621 - Type 2 diabetes mellitus with foot ulcer L97.312 - Non-pressure chronic ulcer of right ankle with fat layer exposed L97.322 - Non-pressure chronic ulcer of left ankle with fat layer exposed L03.115 - Cellulitis of right lower limb L97.522 - Non-pressure chronic ulcer of other part of left  foot with fat layer exposed Plan Wound Cleansing: Wound #2 Right Malleolus: Cleanse wound with mild soap and water May Shower, gently pat wound dry prior to applying new dressing. May shower with protection. Wound #4 Left Toe Fourth: Cleanse wound with mild soap and water May Shower, gently pat wound dry prior to applying new dressing. May shower with protection. Wound #5 Right Toe Fourth: Cleanse wound with mild soap and water May Shower, gently pat wound dry prior to applying new dressing. May shower with protection. Anesthetic: Wound #2 Right Malleolus: Topical Lidocaine 4%  cream applied to wound bed prior to debridement Wound #4 Left Toe Fourth: Topical Lidocaine 4% cream applied to wound bed prior to debridement Wound #5 Right Toe Fourth: Topical Lidocaine 4% cream applied to wound bed prior to debridement Skin Barriers/Peri-Wound Care: Wound #2 Right Malleolus: Skin Prep Antifungal cream Primary Wound Dressing: Wound #2 Right Malleolus: Prisma Ag Wound #4 Left Toe Fourth: Aquacel Ag Secondary Dressing: Wound #2 Right Malleolus: Dry Gauze Other - telfa island Jessica Donovan, Jessica Donovan ([id number]) Wound #4 Left Toe Fourth: Dry Gauze Other - coverlet (band-aide) Wound #5 Right Toe Fourth: Dry Gauze Foam Other - coverlet (band-aide) Dressing Change Frequency: Wound #2 Right Malleolus: Change dressing every other day. Wound #4 Left Toe Fourth: Change dressing every other day. Wound #5 Right Toe Fourth: Change dressing every other day. Follow-up Appointments: Wound #2 Right Malleolus: Return Appointment in 1 week. Wound #4 Left Toe Fourth: Return Appointment in 1 week. Wound #5 Right Toe Fourth: Return Appointment in 1 week. Edema Control: Wound #2 Right Malleolus: Elevate legs to the level of the heart and pump ankles as often as possible Wound #4 Left Toe Fourth: Elevate legs to the level of the heart and pump ankles as often as possible Wound #5 Right Toe  Fourth: Elevate legs to the level of the heart and pump ankles as often as possible Additional Orders / Instructions: Wound #2 Right Malleolus: Increase protein intake. Activity as tolerated Wound #4 Left Toe Fourth: Increase protein intake. Activity as tolerated Wound #5 Right Toe Fourth: Increase protein intake. Activity as tolerated she has made a very good recovery over the last week and after review today, I have recommended: 1. Prisma AG to the right ankle and the right fourth toe 2. a bordered foam to her left lateral ankle and Silver alginate to the left fourth toe 3. good control of her diabetes mellitus 4. Adequate offloading of both these areas Electronic Signature(s) Signed: 10/28/2017 3:14:41 PM By: Evlyn Kanner MD, FACS Entered By: Evlyn Kanner on 10/28/2017 15:14:39 Jessica Donovan ([id number]) -------------------------------------------------------------------------------- ROS/PFSH Details Patient Name: Jessica Donovan Date of Service: 10/28/2017 2:15 PM Medical Record Number: [id number] Patient Account Number: [id number] Date of Birth/Sex: 28-Nov-1946 (71 y.o. Female) Treating RN: Ashok Cordia, Debi Primary Care Provider: Garlon Hatchet Other Clinician: Referring Provider: Garlon Hatchet Treating Provider/Extender: Rudene Re in Treatment: 16 Information Obtained From Patient Wound History Do you currently have one or more open woundso Yes How many open wounds do you currently haveo 3 How have you been treating your wound(s) until nowo santyl Has your wound(s) ever healed and then re-openedo No Have you had any lab work done in the past montho No Have you tested positive for an antibiotic resistant organism (MRSA, VRE)o No Have you tested positive for osteomyelitis (bone infection)o No Have you had any tests for circulation on your legso No Have you had other problems associated with your woundso Infection Eyes Medical History: Negative for:  Cataracts; Glaucoma; Optic Neuritis Ear/Nose/Mouth/Throat Medical History: Negative for: Chronic sinus problems/congestion; Middle ear problems Hematologic/Lymphatic Medical History: Negative for: Anemia; Hemophilia; Human Immunodeficiency Virus; Lymphedema; Sickle Cell Disease Respiratory Medical History: Positive for: Sleep Apnea Negative for: Aspiration; Asthma; Chronic Obstructive Pulmonary Disease (COPD); Pneumothorax; Tuberculosis Cardiovascular Medical History: Positive for: Coronary Artery Disease; Hypertension; Myocardial Infarction Gastrointestinal Medical History: Negative for: Cirrhosis ; Colitis; Crohnos; Hepatitis A; Hepatitis B; Hepatitis C Endocrine Medical History: Positive for: Type II Diabetes Time with diabetes: 20 [place], Christmas ([id number]) Treated with: Insulin Blood sugar tested every day: No  Blood sugar testing results: Breakfast: 178 Immunological Medical History: Negative for: Lupus Erythematosus; Raynaudos; Scleroderma Integumentary (Skin) Medical History: Positive for: History of pressure wounds Negative for: History of Burn Musculoskeletal Medical History: Positive for: Osteoarthritis Negative for: Gout; Rheumatoid Arthritis; Osteomyelitis Neurologic Medical History: Positive for: Neuropathy Negative for: Dementia; Quadriplegia; Paraplegia; Seizure Disorder Oncologic Medical History: Negative for: Received Chemotherapy; Received Radiation Psychiatric Medical History: Positive for: Confinement Anxiety Negative for: Anorexia/bulimia Immunizations Pneumococcal Vaccine: Received Pneumococcal Vaccination: No Implantable Devices Family and Social History Cancer: No; Diabetes: No; Heart Disease: No; Hereditary Spherocytosis: No; Hypertension: No; Kidney Disease: No; Lung Disease: No; Seizures: No; Stroke: Yes - Mother; Thyroid Problems: No; Tuberculosis: No; Never smoker; Marital Status - Single; Alcohol Use: Never; Drug Use: No History;  Caffeine Use: Never; Financial Concerns: No; Food, Clothing or Shelter Needs: No; Support System Lacking: No; Transportation Concerns: No; Advanced Directives: No; Patient does not want information on Advanced Directives; Living Will: No Physician Affirmation I have reviewed and agree with the above information. Electronic Signature(s) Signed: 10/28/2017 4:32:33 PM By: Evlyn Kanner MD, FACS Signed: 10/29/2017 4:36:47 PM By: Brayton Layman, Jessica Donovan ([id number]) Entered By: Evlyn Kanner on 10/28/2017 15:13:35 Jessica Donovan, Jessica Donovan ([id number]) -------------------------------------------------------------------------------- SuperBill Details Patient Name: Jessica Donovan Date of Service: 10/28/2017 Medical Record Number: [id number] Patient Account Number: [id number] Date of Birth/Sex: 1946-09-22 (71 y.o. 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.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 left ankle with fat layer exposed L03.115 Cellulitis of right lower limb L97.522 Non-pressure chronic ulcer of other part of left foot with fat layer exposed Facility Procedures CPT4 Code: 62952841 Description: 99214 - WOUND CARE VISIT-LEV 4 EST PT Modifier: Quantity: 1 Physician Procedures CPT4 Code: 3244010 Description: 99213 - WC PHYS LEVEL 3 - EST PT ICD-10 Diagnosis Description E11.621 Type 2 diabetes mellitus with foot ulcer L97.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 layer expo L97.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 left ankle with fat  layer expos L97.522 Non-pressure chronic ulcer of other part of left foot with fa Modifier: sed ed t layer exposed Quantity:  1 Electronic Signature(s) Signed: 10/28/2017 3:16:13 PM By: Alejandro Mulling Signed: 10/28/2017 4:32:33 PM By: Evlyn Kanner MD, FACS Previous Signature: 10/28/2017 3:14:55 PM Version By: Evlyn Kanner MD, FACS Entered By: Alejandro Mulling on 10/28/2017 15:16:12

## 2017-10-31 NOTE — Progress Notes (Signed)
Jessica Donovan ([id number]) Visit Report for 10/28/2017 Arrival Information Details Patient Name: Jessica Donovan, Jessica Donovan Date of Service: 10/28/2017 2:15 PM Medical Record Number: [id number] Patient Account Number: [id number] Date of Birth/Sex: 1946-12-09 (71 y.o. Female) Treating RN: Ashok Cordia, Debi Primary Care Keiona Jenison: Garlon Hatchet Other Clinician: Referring Sophya Vanblarcom: Garlon Hatchet Treating Aubert Choyce/Extender: Rudene Re in Treatment: 16 Visit Information History Since Last Visit All ordered tests and consults were completed: No Patient Arrived: Dan Humphreys Added or deleted any medications: No Arrival Time: 14:02 Any new allergies or adverse reactions: No Accompanied By: self Had a fall or experienced change in No Transfer Assistance: EasyPivot Patient activities of daily living that may affect Lift risk of falls: Patient Identification Verified: Yes Signs or symptoms of abuse/neglect since last visito No Secondary Verification Process Yes Hospitalized since last visit: No Completed: Has Dressing in Place as Prescribed: Yes Patient Requires Transmission-Based No Precautions: Pain Present Now: No Patient Has Alerts: Yes Patient Alerts: Patient on Blood Thinner ASA Electronic Signature(s) Signed: 10/29/2017 4:36:47 PM By: Alejandro Mulling Entered By: Alejandro Mulling on 10/28/2017 14:11:27 Standard, [name] ([id number]) -------------------------------------------------------------------------------- Clinic Level of Care Assessment Details Patient Name: Jessica Donovan Date of Service: 10/28/2017 2:15 PM Medical Record Number: [id number] Patient Account Number: [id number] Date of Birth/Sex: 19-Aug-1946 (71 y.o. Female) Treating RN: Ashok Cordia, Debi Primary Care Leather Estis: Garlon Hatchet Other Clinician: Referring Tristyn Pharris: Garlon Hatchet Treating Ossie Yebra/Extender: Rudene Re in Treatment: 16 Clinic Level of Care Assessment Items TOOL 4 Quantity Score X -  Use when only an EandM is performed on FOLLOW-UP visit 1 0 ASSESSMENTS - Nursing Assessment / Reassessment X - Reassessment of Co-morbidities (includes updates in patient status) 1 10 X- 1 5 Reassessment of Adherence to Treatment Plan ASSESSMENTS - Wound and Skin Assessment / Reassessment []  - Simple Wound Assessment / Reassessment - one wound 0 X- 3 5 Complex Wound Assessment / Reassessment - multiple wounds []  - 0 Dermatologic / Skin Assessment (not related to wound area) ASSESSMENTS - Focused Assessment []  - Circumferential Edema Measurements - multi extremities 0 []  - 0 Nutritional Assessment / Counseling / Intervention []  - 0 Lower Extremity Assessment (monofilament, tuning fork, pulses) []  - 0 Peripheral Arterial Disease Assessment (using hand held doppler) ASSESSMENTS - Ostomy and/or Continence Assessment and Care []  - Incontinence Assessment and Management 0 []  - 0 Ostomy Care Assessment and Management (repouching, etc.) PROCESS - Coordination of Care X - Simple Patient / Family Education for ongoing care 1 15 []  - 0 Complex (extensive) Patient / Family Education for ongoing care []  - 0 Staff obtains Chiropractor, Records, Test Results / Process Orders []  - 0 Staff telephones HHA, Nursing Homes / Clarify orders / etc []  - 0 Routine Transfer to another Facility (non-emergent condition) []  - 0 Routine Hospital Admission (non-emergent condition) []  - 0 New Admissions / Manufacturing engineer / Ordering NPWT, Apligraf, etc. []  - 0 Emergency Hospital Admission (emergent condition) X- 1 10 Simple Discharge Coordination Hilmer, Leora ([id number]) []  - 0 Complex (extensive) Discharge Coordination PROCESS - Special Needs []  - Pediatric / Minor Patient Management 0 []  - 0 Isolation Patient Management []  - 0 Hearing / Language / Visual special needs []  - 0 Assessment of Community assistance (transportation, D/C planning, etc.) []  - 0 Additional assistance / Altered  mentation []  - 0 Support Surface(s) Assessment (bed, cushion, seat, etc.) INTERVENTIONS - Wound Cleansing / Measurement []  - Simple Wound Cleansing - one wound 0 X- 3 5 Complex Wound Cleansing - multiple wounds X- 1 5 Wound Imaging (photographs -  any number of wounds) []  - 0 Wound Tracing (instead of photographs) []  - 0 Simple Wound Measurement - one wound X- 3 5 Complex Wound Measurement - multiple wounds INTERVENTIONS - Wound Dressings X - Small Wound Dressing one or multiple wounds 3 10 []  - 0 Medium Wound Dressing one or multiple wounds []  - 0 Large Wound Dressing one or multiple wounds X- 1 5 Application of Medications - topical []  - 0 Application of Medications - injection INTERVENTIONS - Miscellaneous []  - External ear exam 0 []  - 0 Specimen Collection (cultures, biopsies, blood, body fluids, etc.) []  - 0 Specimen(s) / Culture(s) sent or taken to Lab for analysis []  - 0 Patient Transfer (multiple staff / Nurse, adultHoyer Lift / Similar devices) []  - 0 Simple Staple / Suture removal (25 or less) []  - 0 Complex Staple / Suture removal (26 or more) []  - 0 Hypo / Hyperglycemic Management (close monitor of Blood Glucose) []  - 0 Ankle / Brachial Index (ABI) - do not check if billed separately X- 1 5 Vital Signs Pickler, Carter ([id number]) Has the patient been seen at the hospital within the last three years: Yes Total Score: 130 Level Of Care: New/Established - Level 4 Electronic Signature(s) Signed: 10/29/2017 4:36:47 PM By: Alejandro MullingPinkerton, Debra Entered By: Alejandro MullingPinkerton, Debra on 10/28/2017 15:16:03 Jessica Donovan ([id number]) -------------------------------------------------------------------------------- Encounter Discharge Information Details Patient Name: Jessica Donovan Date of Service: 10/28/2017 2:15 PM Medical Record Number: [id number] Patient Account Number: [id number] Date of Birth/Sex: September 18, 1946 5(71 y.o. Female) Treating RN: Ashok CordiaPinkerton, Debi Primary Care Tyger Oka:  Garlon HatchetMCCONVILLE, ROBERT Other Clinician: Referring Willard Madrigal: Garlon HatchetMCCONVILLE, ROBERT Treating Amario Longmore/Extender: Rudene ReBritto, Errol Weeks in Treatment: 16 Encounter Discharge Information Items Discharge Pain Level: 0 Discharge Condition: Stable Ambulatory Status: Walker Discharge Destination: Home Transportation: Private Auto Accompanied By: self Schedule Follow-up Appointment: Yes Medication Reconciliation completed and No provided to Patient/Care Cristofer Yaffe: Provided on Clinical Summary of Care: 10/28/2017 Form Type Recipient Paper Patient BG Electronic Signature(s) Signed: 10/28/2017 3:05:28 PM By: Alejandro MullingPinkerton, Debra Previous Signature: 10/28/2017 2:24:28 PM Version By: Alejandro MullingPinkerton, Debra Entered By: Alejandro MullingPinkerton, Debra on 10/28/2017 15:05:28 Jessica DanceGLASS, Selen ([id number]) -------------------------------------------------------------------------------- Lower Extremity Assessment Details Patient Name: Jessica DanceGLASS, Valeree Date of Service: 10/28/2017 2:15 PM Medical Record Number: [id number] Patient Account Number: [id number] Date of Birth/Sex: September 18, 1946 (71 y.o. Female) Treating RN: Ashok CordiaPinkerton, Debi Primary Care Jaycelynn Knickerbocker: Garlon HatchetMCCONVILLE, ROBERT Other Clinician: Referring Doniesha Landau: Garlon HatchetMCCONVILLE, ROBERT Treating Daquane Aguilar/Extender: Rudene ReBritto, Errol Weeks in Treatment: 16 Vascular Assessment Pulses: Dorsalis Pedis Palpable: [Left:Yes] [Right:Yes] Posterior Tibial Extremity colors, hair growth, and conditions: Extremity Color: [Left:Normal] [Right:Normal] Temperature of Extremity: [Left:Warm] [Right:Warm] Capillary Refill: [Left:< 3 seconds] [Right:< 3 seconds] Electronic Signature(s) Signed: 10/28/2017 2:23:42 PM By: Alejandro MullingPinkerton, Debra Entered By: Alejandro MullingPinkerton, Debra on 10/28/2017 14:23:42 Santizo, Britta MccreedyBARBARA ([id number]) -------------------------------------------------------------------------------- Multi Wound Chart Details Patient Name: Jessica DanceGLASS, Kilea Date of Service: 10/28/2017 2:15 PM Medical Record Number:  [id number] Patient Account Number: [id number] Date of Birth/Sex: September 18, 1946 (71 y.o. Female) Treating RN: Ashok CordiaPinkerton, Debi Primary Care Libra Gatz: Garlon HatchetMCCONVILLE, ROBERT Other Clinician: Referring Ellison Rieth: Garlon HatchetMCCONVILLE, ROBERT Treating Endrit Gittins/Extender: Rudene ReBritto, Errol Weeks in Treatment: 16 Vital Signs Height(in): 66 Pulse(bpm): 67 Weight(lbs): 191 Blood Pressure(mmHg): 110/54 Body Mass Index(BMI): 31 Temperature(F): 98.7 Respiratory Rate 18 (breaths/min): Photos: [2:No Photos] [4:No Photos] [5:No Photos] Wound Location: [2:Right Malleolus] [4:Left Toe Fourth] [5:Right Toe Fourth] Wounding Event: [2:Gradually Appeared] [4:Trauma] [5:Gradually Appeared] Primary Etiology: [2:Diabetic Wound/Ulcer of the Lower Extremity] [4:Trauma, Other] [5:Trauma, Other] Comorbid History: [2:Sleep Apnea, Coronary Artery Disease, Hypertension, Myocardial Infarction, Type II Diabetes, History of pressure wounds, Osteoarthritis, Neuropathy, Confinement Anxiety] [4:Sleep Apnea, Coronary Artery Disease,  Hypertension,  Myocardial Infarction, Type II Diabetes, History of pressure wounds, Osteoarthritis, Neuropathy, Confinement Anxiety] [5:Sleep Apnea, Coronary Artery Disease, Hypertension, Myocardial Infarction, Type II Diabetes, History of pressure wounds,  Osteoarthritis, Neuropathy, Confinement Anxiety] Date Acquired: [2:02/17/2017] [4:10/12/2017] [5:10/12/2017] Weeks of Treatment: [2:16] [4:2] [5:1] Wound Status: [2:Open] [4:Open] [5:Open] Measurements L x W x D [2:0.3x0.5x0.1] [4:1.8x1.2x0.1] [5:0.1x0.1x0.1] (cm) Area (cm) : [2:0.118] [4:1.696] [5:0.008] Volume (cm) : [2:0.012] [4:0.17] [5:0.001] % Reduction in Area: [2:90.40%] [4:-145.40%] [5:99.10%] % Reduction in Volume: [2:90.20%] [4:-146.40%] [5:98.90%] Classification: [2:Grade 1] [4:Partial Thickness] [5:Partial Thickness] Exudate Amount: [2:Large] [4:Large] [5:Medium] Exudate Type: [2:Serous] [4:Serosanguineous] [5:Serosanguineous] Exudate Color:  [2:amber] [4:red, brown] [5:red, brown] Wound Margin: [2:Flat and Intact] [4:Distinct, outline attached] [5:Distinct, outline attached] Granulation Amount: [2:Large (67-100%)] [4:Small (1-33%)] [5:Large (67-100%)] Granulation Quality: [2:Red] [4:Red] [5:Red] Necrotic Amount: [2:Small (1-33%)] [4:Large (67-100%)] [5:None Present (0%)] Necrotic Tissue: [2:Adherent Slough] [4:Eschar, Adherent Slough] [5:N/A] Epithelialization: [2:None] [4:None] [5:Small (1-33%)] Periwound Skin Texture: [2:No Abnormalities Noted] [4:No Abnormalities Noted] [5:No Abnormalities Noted] Periwound Skin Moisture: [2:No Abnormalities Noted] [4:Maceration: Yes] [5:Maceration: No] Periwound Skin Color: [2:Erythema: Yes Rubor: Yes] [4:Erythema: Yes] [5:No Abnormalities Noted] Erythema Location: [2:Circumferential] [4:Circumferential] [5:N/A] Temperature: [2:No Abnormality] [4:No Abnormality] [5:No Abnormality] Tenderness on Palpation: Yes Yes Yes Wound Preparation: Ulcer Cleansing: Ulcer Cleansing: Ulcer Cleansing: Rinsed/Irrigated with Saline Rinsed/Irrigated with Saline Rinsed/Irrigated with Saline Topical Anesthetic Applied: Topical Anesthetic Applied: Topical Anesthetic Applied: Other: lidocaine 4% Other: lidocaine 4% Other: lidocaine 4% Treatment Notes Wound #2 (Right Malleolus) 1. Cleansed with: Clean wound with Normal Saline 2. Anesthetic Topical Lidocaine 4% cream to wound bed prior to debridement 4. Dressing Applied: Prisma Ag 5. Secondary Dressing Applied Dry Gauze [place] Wound #4 (Left Toe Fourth) 1. Cleansed with: Clean wound with Normal Saline 2. Anesthetic Topical Lidocaine 4% cream to wound bed prior to debridement 4. Dressing Applied: Aquacel Ag Notes coverlet Wound #5 (Right Toe Fourth) 1. Cleansed with: Clean wound with Normal Saline 2. Anesthetic Topical Lidocaine 4% cream to wound bed prior to debridement 5. Secondary Dressing Applied Dry  Gauze Foam Notes coverlet Electronic Signature(s) Signed: 10/28/2017 3:13:15 PM By: Evlyn Kanner MD, FACS Previous Signature: 10/28/2017 2:23:59 PM Version By: Alejandro Mulling Entered By: Evlyn Kanner on 10/28/2017 15:13:15 Bedgood, [name] ([id number]) -------------------------------------------------------------------------------- Multi-Disciplinary Care Plan Details Patient Name: Jessica Donovan Date of Service: 10/28/2017 2:15 PM Medical Record Number: [id number] Patient Account Number: [id number] Date of Birth/Sex: 12/21/1946 (71 y.o. Female) Treating RN: Ashok Cordia, Debi Primary Care Finneas Mathe: Garlon Hatchet Other Clinician: Referring Madilynn Montante: Garlon Hatchet Treating Daemyn Gariepy/Extender: Rudene Re in Treatment: 16 Active Inactive ` Orientation to the Wound Care Program Nursing Diagnoses: Knowledge deficit related to the wound healing center program Goals: Patient/caregiver will verbalize understanding of the Wound Healing Center Program Date Initiated: 07/08/2017 Target Resolution Date: 10/08/2017 Goal Status: Active Interventions: Provide education on orientation to the wound center Notes: ` Peripheral Neuropathy Nursing Diagnoses: Knowledge deficit related to disease process and management of peripheral neurovascular dysfunction Potential alteration in peripheral tissue perfusion (select prior to confirmation of diagnosis) Goals: Patient/caregiver will verbalize understanding of disease process and disease management Date Initiated: 07/08/2017 Target Resolution Date: 10/08/2017 Goal Status: Active Interventions: Assess signs and symptoms of neuropathy upon admission and as needed Provide education on Management of Neuropathy and Related Ulcers Provide education on Management of Neuropathy upon discharge from the Wound Center Treatment Activities: Patient referred for customized footwear/offloading : 07/08/2017 Patient referred to diabetes educator :  07/08/2017 Notes: ` Wound/Skin Impairment Nursing Diagnoses: Impaired tissue integrity Grindle, [name] ([id number]) Knowledge deficit related  to ulceration/compromised skin integrity Goals: Patient/caregiver will verbalize understanding of skin care regimen Date Initiated: 07/08/2017 Target Resolution Date: 10/08/2017 Goal Status: Active Ulcer/skin breakdown will have a volume reduction of 30% by week 4 Date Initiated: 07/08/2017 Target Resolution Date: 10/08/2017 Goal Status: Active Ulcer/skin breakdown will have a volume reduction of 50% by week 8 Date Initiated: 07/08/2017 Target Resolution Date: 10/08/2017 Goal Status: Active Ulcer/skin breakdown will have a volume reduction of 80% by week 12 Date Initiated: 07/08/2017 Target Resolution Date: 10/08/2017 Goal Status: Active Ulcer/skin breakdown will heal within 14 weeks Date Initiated: 07/08/2017 Target Resolution Date: 10/08/2017 Goal Status: Active Interventions: Assess patient/caregiver ability to obtain necessary supplies Assess patient/caregiver ability to perform ulcer/skin care regimen upon admission and as needed Assess ulceration(s) every visit Treatment Activities: Referred to DME Izacc Demeyer for dressing supplies : 07/08/2017 Skin care regimen initiated : 07/08/2017 Topical wound management initiated : 07/08/2017 Notes: Electronic Signature(s) Signed: 10/28/2017 2:23:49 PM By: Alejandro MullingPinkerton, Debra Entered By: Alejandro MullingPinkerton, Debra on 10/28/2017 14:23:48 Scurlock, Britta MccreedyBARBARA ([id number]) -------------------------------------------------------------------------------- Pain Assessment Details Patient Name: Jessica DanceGLASS, Sanayah Date of Service: 10/28/2017 2:15 PM Medical Record Number: [id number] Patient Account Number: [id number] Date of Birth/Sex: 26-Aug-1946 (71 y.o. Female) Treating RN: Phillis HaggisPinkerton, Debi Primary Care Lakia Gritton: Garlon HatchetMCCONVILLE, ROBERT Other Clinician: Referring Anisa Leanos: Garlon HatchetMCCONVILLE, ROBERT Treating Devika Dragovich/Extender: Rudene ReBritto,  Errol Weeks in Treatment: 16 Active Problems Location of Pain Severity and Description of Pain Patient Has Paino No Site Locations Pain Management and Medication Current Pain Management: Electronic Signature(s) Signed: 10/29/2017 4:36:47 PM By: Alejandro MullingPinkerton, Debra Entered By: Alejandro MullingPinkerton, Debra on 10/28/2017 14:11:42 Lamoureaux, Britta MccreedyBARBARA ([id number]) -------------------------------------------------------------------------------- Patient/Caregiver Education Details Patient Name: Jessica DanceGLASS, Shanterria Date of Service: 10/28/2017 2:15 PM Medical Record Number: [id number] Patient Account Number: [id number] Date of Birth/Gender: 26-Aug-1946 (71 y.o. Female) Treating RN: Phillis HaggisPinkerton, Debi Primary Care Physician: Garlon HatchetMCCONVILLE, ROBERT Other Clinician: Referring Physician: Garlon HatchetMCCONVILLE, ROBERT Treating Physician/Extender: Rudene ReBritto, Errol Weeks in Treatment: 16 Education Assessment Education Provided To: Patient Education Topics Provided Wound/Skin Impairment: Handouts: Other: change dressing as ordered Methods: Demonstration, Explain/Verbal Responses: State content correctly Electronic Signature(s) Signed: 10/29/2017 4:36:47 PM By: Alejandro MullingPinkerton, Debra Entered By: Alejandro MullingPinkerton, Debra on 10/28/2017 14:24:42 Ponds, Britta MccreedyBARBARA ([id number]) -------------------------------------------------------------------------------- Wound Assessment Details Patient Name: Jessica DanceGLASS, Keyanna Date of Service: 10/28/2017 2:15 PM Medical Record Number: [id number] Patient Account Number: [id number] Date of Birth/Sex: 26-Aug-1946 (71 y.o. Female) Treating RN: Ashok CordiaPinkerton, Debi Primary Care Taliana Mersereau: Garlon HatchetMCCONVILLE, ROBERT Other Clinician: Referring Bell Cai: Garlon HatchetMCCONVILLE, ROBERT Treating Ellis Mehaffey/Extender: Rudene ReBritto, Errol Weeks in Treatment: 16 Wound Status Wound Number: 2 Primary Diabetic Wound/Ulcer of the Lower Extremity Etiology: Wound Location: Right Malleolus Wound Open Wounding Event: Gradually Appeared Status: Date Acquired: 02/17/2017 Comorbid  Sleep Apnea, Coronary Artery Disease, Weeks Of Treatment: 16 History: Hypertension, Myocardial Infarction, Type II Clustered Wound: No Diabetes, History of pressure wounds, Osteoarthritis, Neuropathy, Confinement Anxiety Photos Photo Uploaded By: Alejandro MullingPinkerton, Debra on 10/28/2017 16:17:09 Wound Measurements Length: (cm) 0.3 Width: (cm) 0.5 Depth: (cm) 0.1 Area: (cm) 0.118 Volume: (cm) 0.012 % Reduction in Area: 90.4% % Reduction in Volume: 90.2% Epithelialization: None Tunneling: No Undermining: No Wound Description Classification: Grade 1 Wound Margin: Flat and Intact Exudate Amount: Large Exudate Type: Serous Exudate Color: amber Foul Odor After Cleansing: No Slough/Fibrino Yes Wound Bed Granulation Amount: Large (67-100%) Granulation Quality: Red Necrotic Amount: Small (1-33%) Necrotic Quality: Adherent Slough Periwound Skin Texture Texture Color No Abnormalities Noted: No No Abnormalities Noted: No Fager, Yakira ([id number]) Moisture Erythema: Yes No Abnormalities Noted: No Erythema Location: Circumferential Rubor: Yes Temperature / Pain Temperature: No Abnormality Tenderness on Palpation: Yes Wound Preparation Ulcer Cleansing: Rinsed/Irrigated  with Saline Topical Anesthetic Applied: Other: lidocaine 4%, Treatment Notes Wound #2 (Right Malleolus) 1. Cleansed with: Clean wound with Normal Saline 2. Anesthetic Topical Lidocaine 4% cream to wound bed prior to debridement 4. Dressing Applied: Prisma Ag 5. Secondary Dressing Applied Dry Gauze [place] Electronic Signature(s) Signed: 10/29/2017 4:36:47 PM By: Alejandro Mulling Entered By: Alejandro Mulling on 10/28/2017 14:21:10 Amescua, [name] ([id number]) -------------------------------------------------------------------------------- Wound Assessment Details Patient Name: Jessica Donovan Date of Service: 10/28/2017 2:15 PM Medical Record Number: [id number] Patient Account Number: [id number] Date of  Birth/Sex: 1946/05/17 (71 y.o. Female) Treating RN: Ashok Cordia, Debi Primary Care Mikey Maffett: Garlon Hatchet Other Clinician: Referring Jaionna Weisse: Garlon Hatchet Treating Montez Stryker/Extender: Rudene Re in Treatment: 16 Wound Status Wound Number: 4 Primary Trauma, Other Etiology: Wound Location: Left Toe Fourth Wound Open Wounding Event: Trauma Status: Date Acquired: 10/12/2017 Comorbid Sleep Apnea, Coronary Artery Disease, Weeks Of Treatment: 2 History: Hypertension, Myocardial Infarction, Type II Clustered Wound: No Diabetes, History of pressure wounds, Osteoarthritis, Neuropathy, Confinement Anxiety Photos Photo Uploaded By: Alejandro Mulling on 10/28/2017 16:17:10 Wound Measurements Length: (cm) 1.8 Width: (cm) 1.2 Depth: (cm) 0.1 Area: (cm) 1.696 Volume: (cm) 0.17 % Reduction in Area: -145.4% % Reduction in Volume: -146.4% Epithelialization: None Tunneling: No Undermining: No Wound Description Classification: Partial Thickness Wound Margin: Distinct, outline attached Exudate Amount: Large Exudate Type: Serosanguineous Exudate Color: red, brown Foul Odor After Cleansing: No Slough/Fibrino Yes Wound Bed Granulation Amount: Small (1-33%) Exposed Structure Granulation Quality: Red Fascia Exposed: No Necrotic Amount: Large (67-100%) Fat Layer (Subcutaneous Tissue) Exposed: No Necrotic Quality: Eschar, Adherent Slough Tendon Exposed: No Muscle Exposed: No Joint Exposed: No Bone Exposed: No Rennert, Roniqua ([id number]) Periwound Skin Texture Texture Color No Abnormalities Noted: No No Abnormalities Noted: No Erythema: Yes Moisture Erythema Location: Circumferential No Abnormalities Noted: No Maceration: Yes Temperature / Pain Temperature: No Abnormality Tenderness on Palpation: Yes Wound Preparation Ulcer Cleansing: Rinsed/Irrigated with Saline Topical Anesthetic Applied: Other: lidocaine 4%, Treatment Notes Wound #4 (Left Toe Fourth) 1.  Cleansed with: Clean wound with Normal Saline 2. Anesthetic Topical Lidocaine 4% cream to wound bed prior to debridement 4. Dressing Applied: Aquacel Ag Notes coverlet Electronic Signature(s) Signed: 10/29/2017 4:36:47 PM By: Alejandro Mulling Entered By: Alejandro Mulling on 10/28/2017 14:21:40 Burkard, [name] ([id number]) -------------------------------------------------------------------------------- Wound Assessment Details Patient Name: Jessica Donovan Date of Service: 10/28/2017 2:15 PM Medical Record Number: [id number] Patient Account Number: [id number] Date of Birth/Sex: October 23, 1946 (71 y.o. Female) Treating RN: Ashok Cordia, Debi Primary Care Odester Nilson: Garlon Hatchet Other Clinician: Referring Letrice Pollok: Garlon Hatchet Treating Shelden Raborn/Extender: Rudene Re in Treatment: 16 Wound Status Wound Number: 5 Primary Trauma, Other Etiology: Wound Location: Right Toe Fourth Wound Open Wounding Event: Gradually Appeared Status: Date Acquired: 10/12/2017 Comorbid Sleep Apnea, Coronary Artery Disease, Weeks Of Treatment: 1 History: Hypertension, Myocardial Infarction, Type II Clustered Wound: No Diabetes, History of pressure wounds, Osteoarthritis, Neuropathy, Confinement Anxiety Photos Photo Uploaded By: Alejandro Mulling on 10/28/2017 16:18:28 Wound Measurements Length: (cm) 0.1 Width: (cm) 0.1 Depth: (cm) 0.1 Area: (cm) 0.008 Volume: (cm) 0.001 % Reduction in Area: 99.1% % Reduction in Volume: 98.9% Epithelialization: Small (1-33%) Tunneling: No Undermining: No Wound Description Classification: Partial Thickness Wound Margin: Distinct, outline attached Exudate Amount: Medium Exudate Type: Serosanguineous Exudate Color: red, brown Foul Odor After Cleansing: No Slough/Fibrino Yes Wound Bed Granulation Amount: Large (67-100%) Granulation Quality: Red Necrotic Amount: None Present (0%) Periwound Skin Texture Texture Color No Abnormalities Noted:  No No Abnormalities Noted: No Moisture Temperature / Pain Karman, Delma ([id number]) No Abnormalities Noted: No Temperature: No  Abnormality Maceration: No Tenderness on Palpation: Yes Wound Preparation Ulcer Cleansing: Rinsed/Irrigated with Saline Topical Anesthetic Applied: Other: lidocaine 4%, Treatment Notes Wound #5 (Right Toe Fourth) 1. Cleansed with: Clean wound with Normal Saline 2. Anesthetic Topical Lidocaine 4% cream to wound bed prior to debridement 5. Secondary Dressing Applied Dry Gauze Foam Notes coverlet Electronic Signature(s) Signed: 10/29/2017 4:36:47 PM By: Alejandro Mulling Entered By: Alejandro Mulling on 10/28/2017 14:22:04 Jessica Donovan ([id number]) -------------------------------------------------------------------------------- Vitals Details Patient Name: Jessica Donovan Date of Service: 10/28/2017 2:15 PM Medical Record Number: [id number] Patient Account Number: [id number] Date of Birth/Sex: 1946/05/15 (71 y.o. Female) Treating RN: Ashok Cordia, Debi Primary Care Kainoa Swoboda: Garlon Hatchet Other Clinician: Referring Celestine Bougie: Garlon Hatchet Treating Sela Falk/Extender: Rudene Re in Treatment: 16 Vital Signs Time Taken: 14:11 Temperature (F): 98.7 Height (in): 66 Pulse (bpm): 67 Weight (lbs): 191 Respiratory Rate (breaths/min): 18 Body Mass Index (BMI): 30.8 Blood Pressure (mmHg): 110/54 Reference Range: 80 - 120 mg / dl Electronic Signature(s) Signed: 10/29/2017 4:36:47 PM By: Alejandro Mulling Entered By: Alejandro Mulling on 10/28/2017 14:14:32

## 2017-11-05 ENCOUNTER — Encounter: Payer: PPO | Admitting: Surgery

## 2017-11-05 DIAGNOSIS — S91105A Unspecified open wound of left lesser toe(s) without damage to nail, initial encounter: Secondary | ICD-10-CM | POA: Diagnosis not present

## 2017-11-05 DIAGNOSIS — E11621 Type 2 diabetes mellitus with foot ulcer: Secondary | ICD-10-CM | POA: Diagnosis not present

## 2017-11-06 NOTE — Progress Notes (Addendum)
Jessica Donovan, [place] ([id number]) Visit Report for 11/05/2017 Arrival Information Details Patient Name: Jessica Donovan, Jessica Donovan Date of Service: 11/05/2017 3:15 PM Medical Record Number: [id number] Patient Account Number: [id number] Date of Birth/Sex: 15-Oct-1946 (71 y.o. Female) Treating RN: Ashok CordiaPinkerton, Debi Primary Care Hannah Crill: Garlon HatchetMCCONVILLE, ROBERT Other Clinician: Referring Marie Borowski: Garlon HatchetMCCONVILLE, ROBERT Treating Rhianon Zabawa/Extender: Rudene ReBritto, Errol Weeks in Treatment: 17 Visit Information History Since Last Visit All ordered tests and consults were completed: No Patient Arrived: Walker Added or deleted any medications: No Arrival Time: 15:20 Any new allergies or adverse reactions: No Accompanied By: self Had a fall or experienced change in No Transfer Assistance: EasyPivot Patient activities of daily living that may affect Lift risk of falls: Patient Identification Verified: Yes Signs or symptoms of abuse/neglect since last visito No Secondary Verification Process Yes Hospitalized since last visit: No Completed: Has Dressing in Place as Prescribed: Yes Patient Requires Transmission-Based No Precautions: Pain Present Now: No Patient Has Alerts: Yes Patient Alerts: Patient on Blood Thinner ASA Electronic Signature(s) Signed: 11/05/2017 4:11:32 PM By: Alejandro MullingPinkerton, Debra Entered By: Alejandro MullingPinkerton, Debra on 11/05/2017 15:20:21 Jessica Donovan, Jessica Donovan ([id number]) -------------------------------------------------------------------------------- Encounter Discharge Information Details Patient Name: Jessica Donovan, Jessica Donovan Date of Service: 11/05/2017 3:15 PM Medical Record Number: [id number] Patient Account Number: [id number] Date of Birth/Sex: 15-Oct-1946 74(71 y.o. Female) Treating RN: Ashok CordiaPinkerton, Debi Primary Care Irelyn Perfecto: Garlon HatchetMCCONVILLE, ROBERT Other Clinician: Referring Kacee Koren: Garlon HatchetMCCONVILLE, ROBERT Treating Dovber Ernest/Extender: Rudene ReBritto, Errol Weeks in Treatment: 17 Encounter Discharge Information Items Discharge Pain Level:  0 Discharge Condition: Stable Ambulatory Status: Walker Discharge Destination: Home Transportation: Other Accompanied By: self Schedule Follow-up Appointment: Yes Medication Reconciliation completed and No provided to Patient/Care Larrell Rapozo: Provided on Clinical Summary of Care: 11/05/2017 Form Type Recipient Paper Patient BG Electronic Signature(s) Signed: 11/05/2017 3:56:43 PM By: Alejandro MullingPinkerton, Debra Entered By: Alejandro MullingPinkerton, Debra on 11/05/2017 15:56:43 Cragle, Britta MccreedyBARBARA ([id number]) -------------------------------------------------------------------------------- Lower Extremity Assessment Details Patient Name: Jessica Donovan, Jessica Donovan Date of Service: 11/05/2017 3:15 PM Medical Record Number: [id number] Patient Account Number: [id number] Date of Birth/Sex: 15-Oct-1946 (71 y.o. Female) Treating RN: Ashok CordiaPinkerton, Debi Primary Care Wretha Laris: Garlon HatchetMCCONVILLE, ROBERT Other Clinician: Referring Glenmore Karl: Garlon HatchetMCCONVILLE, ROBERT Treating Nilsa Macht/Extender: Rudene ReBritto, Errol Weeks in Treatment: 17 Vascular Assessment Pulses: Dorsalis Pedis Palpable: [Left:Yes] [Right:Yes] Posterior Tibial Extremity colors, hair growth, and conditions: Extremity Color: [Left:Normal] [Right:Normal] Temperature of Extremity: [Left:Warm] [Right:Warm] Capillary Refill: [Left:< 3 seconds] [Right:< 3 seconds] Electronic Signature(s) Signed: 11/05/2017 4:11:32 PM By: Alejandro MullingPinkerton, Debra Entered By: Alejandro MullingPinkerton, Debra on 11/05/2017 15:34:21 Corea, Britta MccreedyBARBARA ([id number]) -------------------------------------------------------------------------------- Multi Wound Chart Details Patient Name: Jessica Donovan, Jessica Donovan Date of Service: 11/05/2017 3:15 PM Medical Record Number: [id number] Patient Account Number: [id number] Date of Birth/Sex: 15-Oct-1946 (71 y.o. Female) Treating RN: Ashok CordiaPinkerton, Debi Primary Care Wilfrid Hyser: Garlon HatchetMCCONVILLE, ROBERT Other Clinician: Referring [name]: Garlon HatchetMCCONVILLE, ROBERT Treating Jerimyah Vandunk/Extender: Rudene ReBritto, Errol Weeks in Treatment:  17 Vital Signs Height(in): 66 Pulse(bpm): 73 Weight(lbs): 191 Blood Pressure(mmHg): 112/54 Body Mass Index(BMI): 31 Temperature(F): 98.7 Respiratory Rate 18 (breaths/min): Photos: Wound Location: Right Malleolus Left Toe Fourth Right Toe Fourth Wounding Event: Gradually Appeared Trauma Gradually Appeared Primary Etiology: Diabetic Wound/Ulcer of the Trauma, Other Trauma, Other Lower Extremity Comorbid History: Sleep Apnea, Coronary Artery Sleep Apnea, Coronary Artery Sleep Apnea, Coronary Artery Disease, Hypertension, Disease, Hypertension, Disease, Hypertension, Myocardial Infarction, Type II Myocardial Infarction, Type II Myocardial Infarction, Type II Diabetes, History of pressure Diabetes, History of pressure Diabetes, History of pressure wounds, Osteoarthritis, wounds, Osteoarthritis, wounds, Osteoarthritis, Neuropathy, Confinement Neuropathy, Confinement Neuropathy, Confinement Anxiety Anxiety Anxiety Date Acquired: 02/17/2017 10/12/2017 10/12/2017 Weeks of Treatment: 17 3 2  Wound Status: Open Open Open Measurements L x W x D 0.3x0.3x0.2 0.8x1.3x0.1  0x0x0 (cm) Area (cm) : 0.071 0.817 0 Volume (cm) : 0.014 0.082 0 % Reduction in Area: 94.20% -18.20% 100.00% % Reduction in Volume: 88.60% -18.80% 100.00% Classification: Grade 1 Partial Thickness Partial Thickness Exudate Amount: Large Large None Present Exudate Type: Serous Serosanguineous N/A Exudate Color: amber red, brown N/A Wound Margin: Flat and Intact Distinct, outline attached Distinct, outline attached Granulation Amount: Large (67-100%) Small (1-33%) None Present (0%) Granulation Quality: Red Red N/A Necrotic Amount: Small (1-33%) Large (67-100%) None Present (0%) Necrotic Tissue: Adherent Slough Eschar, Adherent Slough N/A [place], NECHAMA ([id number]) Epithelialization: None None Small (1-33%) Debridement: N/A Debridement ([id number]) N/A Pre-procedure N/A 15:29 N/A Verification/Time Out Taken: Pain  Control: N/A Lidocaine 4% Topical Solution N/A Tissue Debrided: N/A Fibrin/Slough, Exudates, N/A Subcutaneous Level: N/A Skin/Subcutaneous Tissue N/A Debridement Area (sq cm): N/A 1.04 N/A Instrument: N/A Curette N/A Bleeding: N/A Minimum N/A Hemostasis Achieved: N/A Pressure N/A Procedural Pain: N/A 0 N/A Post Procedural Pain: N/A 0 N/A Debridement Treatment N/A Procedure was tolerated well N/A Response: Post Debridement N/A 0.9x1.4x0.1 N/A Measurements L x W x D (cm) Post Debridement Volume: N/A 0.099 N/A (cm) Periwound Skin Texture: No Abnormalities Noted No Abnormalities Noted No Abnormalities Noted Periwound Skin Moisture: No Abnormalities Noted Maceration: Yes Maceration: No Periwound Skin Color: Erythema: Yes Erythema: Yes No Abnormalities Noted Rubor: Yes Erythema Location: Circumferential Circumferential N/A Temperature: No Abnormality No Abnormality No Abnormality Tenderness on Palpation: Yes Yes Yes Wound Preparation: Ulcer Cleansing: Ulcer Cleansing: Ulcer Cleansing: Rinsed/Irrigated with Saline Rinsed/Irrigated with Saline Rinsed/Irrigated with Saline Topical Anesthetic Applied: Topical Anesthetic Applied: Topical Anesthetic Applied: Other: lidocaine 4% Other: lidocaine 4% None Procedures Performed: N/A Debridement N/A Treatment Notes Wound #2 (Right Malleolus) 1. Cleansed with: Clean wound with Normal Saline 2. Anesthetic Topical Lidocaine 4% cream to wound bed prior to debridement 3. Peri-wound Care: Antifungal cream Skin Prep 4. Dressing Applied: Prisma Ag 5. Secondary Dressing Applied Dry Gauze [place] Wound #4 (Left Toe Fourth) 1. Cleansed with: Clean wound with Normal Saline 2. Anesthetic Topical Lidocaine 4% cream to wound bed prior to debridement Noyce, Correen ([id number]) 4. Dressing Applied: Aquacel Ag Notes coverlet Electronic Signature(s) Signed: 11/05/2017 4:13:16 PM By: Evlyn Kanner MD, FACS Previous Signature:  11/05/2017 4:11:32 PM Version By: Alejandro Mulling Entered By: Evlyn Kanner on 11/05/2017 16:13:16 Shakoor, [name] ([id number]) -------------------------------------------------------------------------------- Multi-Disciplinary Care Plan Details Patient Name: Jessica Donovan Date of Service: 11/05/2017 3:15 PM Medical Record Number: [id number] Patient Account Number: [id number] Date of Birth/Sex: 1946/02/04 (71 y.o. Female) Treating RN: Ashok Cordia, Debi Primary Care Zadin Lange: Garlon Hatchet Other Clinician: Referring Lennyn Gange: Garlon Hatchet Treating Jazzie Trampe/Extender: Rudene Re in Treatment: 91 Active Inactive ` Orientation to the Wound Care Program Nursing Diagnoses: Knowledge deficit related to the wound healing center program Goals: Patient/caregiver will verbalize understanding of the Wound Healing Center Program Date Initiated: 07/08/2017 Target Resolution Date: 10/08/2017 Goal Status: Active Interventions: Provide education on orientation to the wound center Notes: ` Peripheral Neuropathy Nursing Diagnoses: Knowledge deficit related to disease process and management of peripheral neurovascular dysfunction Potential alteration in peripheral tissue perfusion (select prior to confirmation of diagnosis) Goals: Patient/caregiver will verbalize understanding of disease process and disease management Date Initiated: 07/08/2017 Target Resolution Date: 10/08/2017 Goal Status: Active Interventions: Assess signs and symptoms of neuropathy upon admission and as needed Provide education on Management of Neuropathy and Related Ulcers Provide education on Management of Neuropathy upon discharge from the Wound Center Treatment Activities: Patient referred for customized footwear/offloading : 07/08/2017 Patient referred to diabetes educator : 07/08/2017 Notes: `  Wound/Skin Impairment Nursing Diagnoses: Impaired tissue integrity Maggard, Jessicalynn ([id number]) Knowledge  deficit related to ulceration/compromised skin integrity Goals: Patient/caregiver will verbalize understanding of skin care regimen Date Initiated: 07/08/2017 Target Resolution Date: 10/08/2017 Goal Status: Active Ulcer/skin breakdown will have a volume reduction of 30% by week 4 Date Initiated: 07/08/2017 Target Resolution Date: 10/08/2017 Goal Status: Active Ulcer/skin breakdown will have a volume reduction of 50% by week 8 Date Initiated: 07/08/2017 Target Resolution Date: 10/08/2017 Goal Status: Active Ulcer/skin breakdown will have a volume reduction of 80% by week 12 Date Initiated: 07/08/2017 Target Resolution Date: 10/08/2017 Goal Status: Active Ulcer/skin breakdown will heal within 14 weeks Date Initiated: 07/08/2017 Target Resolution Date: 10/08/2017 Goal Status: Active Interventions: Assess patient/caregiver ability to obtain necessary supplies Assess patient/caregiver ability to perform ulcer/skin care regimen upon admission and as needed Assess ulceration(s) every visit Treatment Activities: Referred to DME Venicia Vandall for dressing supplies : 07/08/2017 Skin care regimen initiated : 07/08/2017 Topical wound management initiated : 07/08/2017 Notes: Electronic Signature(s) Signed: 11/05/2017 4:11:32 PM By: Alejandro Mulling Entered By: Alejandro Mulling on 11/05/2017 15:29:11 Tu, [name] ([id number]) -------------------------------------------------------------------------------- Pain Assessment Details Patient Name: Jessica Donovan Date of Service: 11/05/2017 3:15 PM Medical Record Number: [id number] Patient Account Number: [id number] Date of Birth/Sex: Oct 20, 1946 (71 y.o. Female) Treating RN: Phillis Haggis Primary Care Harrietta Incorvaia: Garlon Hatchet Other Clinician: Referring Theodis Kinsel: Garlon Hatchet Treating Annabeth Tortora/Extender: Rudene Re in Treatment: 17 Active Problems Location of Pain Severity and Description of Pain Patient Has Paino No Site  Locations Pain Management and Medication Current Pain Management: Electronic Signature(s) Signed: 11/05/2017 4:11:32 PM By: Alejandro Mulling Entered By: Alejandro Mulling on 11/05/2017 15:20:25 Jessica Donovan ([id number]) -------------------------------------------------------------------------------- Patient/Caregiver Education Details Patient Name: Jessica Donovan Date of Service: 11/05/2017 3:15 PM Medical Record Number: [id number] Patient Account Number: [id number] Date of Birth/Gender: 03-31-1946 (71 y.o. Female) Treating RN: Phillis Haggis Primary Care Physician: Garlon Hatchet Other Clinician: Referring Physician: Garlon Hatchet Treating Physician/Extender: Rudene Re in Treatment: 17 Education Assessment Education Provided To: Patient Education Topics Provided Wound/Skin Impairment: Handouts: Other: change dressing as ordered Methods: Demonstration, Explain/Verbal Responses: State content correctly Electronic Signature(s) Signed: 11/05/2017 4:11:32 PM By: Alejandro Mulling Entered By: Alejandro Mulling on 11/05/2017 15:30:24 Rust, [name] ([id number]) -------------------------------------------------------------------------------- Wound Assessment Details Patient Name: Jessica Donovan Date of Service: 11/05/2017 3:15 PM Medical Record Number: [id number] Patient Account Number: [id number] Date of Birth/Sex: Dec 17, 1946 (71 y.o. Female) Treating RN: Ashok Cordia, Debi Primary Care Shell Yandow: Garlon Hatchet Other Clinician: Referring Evonte Prestage: Garlon Hatchet Treating Stella Bortle/Extender: Rudene Re in Treatment: 17 Wound Status Wound Number: 2 Primary Diabetic Wound/Ulcer of the Lower Extremity Etiology: Wound Location: Right Malleolus Wound Open Wounding Event: Gradually Appeared Status: Date Acquired: 02/17/2017 Comorbid Sleep Apnea, Coronary Artery Disease, Weeks Of Treatment: 17 History: Hypertension, Myocardial Infarction, Type  II Clustered Wound: No Diabetes, History of pressure wounds, Osteoarthritis, Neuropathy, Confinement Anxiety Photos Photo Uploaded By: Alejandro Mulling on 11/05/2017 16:04:55 Wound Measurements Length: (cm) 0.3 Width: (cm) 0.3 Depth: (cm) 0.2 Area: (cm) 0.071 Volume: (cm) 0.014 % Reduction in Area: 94.2% % Reduction in Volume: 88.6% Epithelialization: None Tunneling: No Undermining: No Wound Description Classification: Grade 1 Wound Margin: Flat and Intact Exudate Amount: Large Exudate Type: Serous Exudate Color: amber Foul Odor After Cleansing: No Slough/Fibrino Yes Wound Bed Granulation Amount: Large (67-100%) Granulation Quality: Red Necrotic Amount: Small (1-33%) Necrotic Quality: Adherent Slough Periwound Skin Texture Texture Color No Abnormalities Noted: No No Abnormalities Noted: No Giovanni, An ([id number]) Moisture Erythema: Yes No Abnormalities Noted: No Erythema Location: Circumferential Rubor: Yes Temperature /  Pain Temperature: No Abnormality Tenderness on Palpation: Yes Wound Preparation Ulcer Cleansing: Rinsed/Irrigated with Saline Topical Anesthetic Applied: Other: lidocaine 4%, Treatment Notes Wound #2 (Right Malleolus) 1. Cleansed with: Clean wound with Normal Saline 2. Anesthetic Topical Lidocaine 4% cream to wound bed prior to debridement 3. Peri-wound Care: Antifungal cream Skin Prep 4. Dressing Applied: Prisma Ag 5. Secondary Dressing Applied Dry Gauze [place] Electronic Signature(s) Signed: 11/05/2017 4:11:32 PM By: Alejandro Mulling Entered By: Alejandro Mulling on 11/05/2017 15:23:22 Jessica Donovan ([id number]) -------------------------------------------------------------------------------- Wound Assessment Details Patient Name: Jessica Donovan Date of Service: 11/05/2017 3:15 PM Medical Record Number: [id number] Patient Account Number: [id number] Date of Birth/Sex: 10-12-1946 (71 y.o. Female) Treating RN: Ashok Cordia,  Debi Primary Care Ameer Sanden: Garlon Hatchet Other Clinician: Referring Ayjah Show: Garlon Hatchet Treating Jaspreet Hollings/Extender: Rudene Re in Treatment: 17 Wound Status Wound Number: 4 Primary Trauma, Other Etiology: Wound Location: Left Toe Fourth Wound Open Wounding Event: Trauma Status: Date Acquired: 10/12/2017 Comorbid Sleep Apnea, Coronary Artery Disease, Weeks Of Treatment: 3 History: Hypertension, Myocardial Infarction, Type II Clustered Wound: No Diabetes, History of pressure wounds, Osteoarthritis, Neuropathy, Confinement Anxiety Photos Photo Uploaded By: Alejandro Mulling on 11/05/2017 16:04:55 Wound Measurements Length: (cm) 0.8 Width: (cm) 1.3 Depth: (cm) 0.1 Area: (cm) 0.817 Volume: (cm) 0.082 % Reduction in Area: -18.2% % Reduction in Volume: -18.8% Epithelialization: None Tunneling: No Undermining: No Wound Description Classification: Partial Thickness Wound Margin: Distinct, outline attached Exudate Amount: Large Exudate Type: Serosanguineous Exudate Color: red, brown Foul Odor After Cleansing: No Slough/Fibrino Yes Wound Bed Granulation Amount: Small (1-33%) Exposed Structure Granulation Quality: Red Fascia Exposed: No Necrotic Amount: Large (67-100%) Fat Layer (Subcutaneous Tissue) Exposed: No Necrotic Quality: Eschar, Adherent Slough Tendon Exposed: No Muscle Exposed: No Joint Exposed: No Bone Exposed: No Raska, Eva ([id number]) Periwound Skin Texture Texture Color No Abnormalities Noted: No No Abnormalities Noted: No Erythema: Yes Moisture Erythema Location: Circumferential No Abnormalities Noted: No Maceration: Yes Temperature / Pain Temperature: No Abnormality Tenderness on Palpation: Yes Wound Preparation Ulcer Cleansing: Rinsed/Irrigated with Saline Topical Anesthetic Applied: Other: lidocaine 4%, Treatment Notes Wound #4 (Left Toe Fourth) 1. Cleansed with: Clean wound with Normal Saline 2.  Anesthetic Topical Lidocaine 4% cream to wound bed prior to debridement 4. Dressing Applied: Aquacel Ag Notes coverlet Electronic Signature(s) Signed: 11/05/2017 4:11:32 PM By: Alejandro Mulling Entered By: Alejandro Mulling on 11/05/2017 15:28:02 Jessica Donovan ([id number]) -------------------------------------------------------------------------------- Wound Assessment Details Patient Name: Jessica Donovan Date of Service: 11/05/2017 3:15 PM Medical Record Number: [id number] Patient Account Number: [id number] Date of Birth/Sex: Sep 12, 1946 (71 y.o. Female) Treating RN: Ashok Cordia, Debi Primary Care Leta Bucklin: Garlon Hatchet Other Clinician: Referring Maxen Rowland: Garlon Hatchet Treating Deann Mclaine/Extender: Rudene Re in Treatment: 17 Wound Status Wound Number: 5 Primary Trauma, Other Etiology: Wound Location: Right Toe Fourth Wound Open Wounding Event: Gradually Appeared Status: Date Acquired: 10/12/2017 Comorbid Sleep Apnea, Coronary Artery Disease, Weeks Of Treatment: 2 History: Hypertension, Myocardial Infarction, Type II Clustered Wound: No Diabetes, History of pressure wounds, Osteoarthritis, Neuropathy, Confinement Anxiety Photos Photo Uploaded By: Alejandro Mulling on 11/05/2017 16:05:39 Wound Measurements Length: (cm) 0 % Re Width: (cm) 0 % Re Depth: (cm) 0 Epit Area: (cm) 0 Tun Volume: (cm) 0 Und duction in Area: 100% duction in Volume: 100% helialization: Small (1-33%) neling: No ermining: No Wound Description Classification: Partial Thickness Wound Margin: Distinct, outline attached Exudate Amount: None Present Foul Odor After Cleansing: No Slough/Fibrino No Wound Bed Granulation Amount: None Present (0%) Necrotic Amount: None Present (0%) Periwound Skin Texture Texture Color No Abnormalities Noted: No No  Abnormalities Noted: No Moisture Temperature / Pain No Abnormalities Noted: No Temperature: No Abnormality Maceration:  No Tenderness on Palpation: Yes Hammond, Janel ([id number]) Wound Preparation Ulcer Cleansing: Rinsed/Irrigated with Saline Topical Anesthetic Applied: None Electronic Signature(s) Signed: 11/05/2017 4:11:32 PM By: Alejandro MullingPinkerton, Debra Entered By: Alejandro MullingPinkerton, Debra on 11/05/2017 15:34:11 Rudell, Britta MccreedyBARBARA ([id number]) -------------------------------------------------------------------------------- Vitals Details Patient Name: Jessica Donovan, Jessica Donovan Date of Service: 11/05/2017 3:15 PM Medical Record Number: [id number] Patient Account Number: [id number] Date of Birth/Sex: 01/23/1946 (71 y.o. Female) Treating RN: Ashok CordiaPinkerton, Debi Primary Care Sylus Stgermain: Garlon HatchetMCCONVILLE, ROBERT Other Clinician: Referring Aariona Momon: Garlon HatchetMCCONVILLE, ROBERT Treating Shuntia Exton/Extender: Rudene ReBritto, Errol Weeks in Treatment: 17 Vital Signs Time Taken: 15:20 Temperature (F): 98.7 Height (in): 66 Pulse (bpm): 73 Weight (lbs): 191 Respiratory Rate (breaths/min): 18 Body Mass Index (BMI): 30.8 Blood Pressure (mmHg): 112/54 Reference Range: 80 - 120 mg / dl Electronic Signature(s) Signed: 11/05/2017 4:11:32 PM By: Alejandro MullingPinkerton, Debra Entered By: Alejandro MullingPinkerton, Debra on 11/05/2017 15:20:51

## 2017-11-07 NOTE — Progress Notes (Signed)
Jessica Donovan, Jessica Donovan ([id number]) Visit Report for 11/05/2017 Chief Complaint Document Details Patient Name: Jessica Donovan, Jessica Donovan Date of Service: 11/05/2017 3:15 PM Medical Record Number: [id number] Patient Account Number: [id number] Date of Birth/Sex: 07/17/1946 (71 y.o. Female) Treating RN: Phillis Haggis Primary Care Provider: Garlon Hatchet Other Clinician: Referring Provider: Garlon Hatchet Treating Provider/Extender: Rudene Re in Treatment: 17 Information Obtained from: Patient Chief Complaint Patients presents for treatment of an open diabetic ulcer to the right ankle the left ankle and the right third toe Electronic Signature(s) Signed: 11/05/2017 4:13:43 PM By: Evlyn Kanner MD, FACS Entered By: Evlyn Kanner on 11/05/2017 16:13:43 Jessica Donovan ([id number]) -------------------------------------------------------------------------------- Debridement Details Patient Name: Jessica Donovan Date of Service: 11/05/2017 3:15 PM Medical Record Number: [id number] Patient Account Number: [id number] Date of Birth/Sex: 07-25-1946 (71 y.o. Female) Treating RN: Ashok Cordia, Debi Primary Care Provider: Garlon Hatchet Other Clinician: Referring Provider: Garlon Hatchet Treating Provider/Extender: Rudene Re in Treatment: 17 Debridement Performed for Wound #4 Left Toe Fourth Assessment: Performed By: Physician Evlyn Kanner, MD Debridement: Debridement Pre-procedure Verification/Time Yes - 15:29 Out Taken: Start Time: 15:30 Pain Control: Lidocaine 4% Topical Solution Level: Skin/Subcutaneous Tissue Total Area Debrided (L x W): 0.8 (cm) x 1.3 (cm) = 1.04 (cm) Tissue and other material Viable, Non-Viable, Exudate, Fibrin/Slough, Subcutaneous debrided: Instrument: Curette Bleeding: Minimum Hemostasis Achieved: Pressure End Time: 15:32 Procedural Pain: 0 Post Procedural Pain: 0 Response to Treatment: Procedure was tolerated well Post Debridement  Measurements of Total Wound Length: (cm) 0.9 Width: (cm) 1.4 Depth: (cm) 0.1 Volume: (cm) 0.099 Character of Wound/Ulcer Post Debridement: Requires Further Debridement Post Procedure Diagnosis Same as Pre-procedure Electronic Signature(s) Signed: 11/05/2017 4:13:25 PM By: Evlyn Kanner MD, FACS Signed: 11/05/2017 4:51:20 PM By: Alejandro Mulling Previous Signature: 11/05/2017 4:11:32 PM Version By: Alejandro Mulling Entered By: Evlyn Kanner on 11/05/2017 16:13:24 Nieland, Jessica Donovan ([id number]) -------------------------------------------------------------------------------- HPI Details Patient Name: Jessica Donovan Date of Service: 11/05/2017 3:15 PM Medical Record Number: [id number] Patient Account Number: [id number] Date of Birth/Sex: November 05, 1946 (71 y.o. Female) Treating RN: Phillis Haggis Primary Care Provider: Garlon Hatchet Other Clinician: Referring Provider: Garlon Hatchet Treating Provider/Extender: Rudene Re in Treatment: 17 History of Present Illness HPI Description: 71 year old diabetic patient who has chronic back pain, collagen vascular disease, coronary artery disease, hypertension and history of osteomyelitis of the toe in February 2017. 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 She also has had joint replacement surgery in the past. She was recently reviewed by Primary care management team and had a problem with the toe on her right foot and her left ankle had minimal drainage, and the right ankle was draining worse. She was seen in the ER on 06/22/2017, and on the x-ray, there was no evidence of any acute bony abnormality. 07/22/2017 -- she says she had a mild abrasion to her right second toe which was inadvertent but there is no open ulceration. 07/30/17 on evaluation today patient appears to be doing better in regard to her abrasion to the right second toe. It appears there's little bit of a pressure injury although there  is no opening at this point. I think this is pushing up on her shoe which is causing the issue at this point and that's due to the contraction that happening with her toe. With that being said patient wonders if there's anything that can be done in that regard for this. She has been tolerating the dressing changes without any complication and is having really no pain. Her right lateral heel  wound appears to be slough covered though there is no evidence of infection. No fevers, chills, nausea, or vomiting noted at this time. 08/09/2017 -- since last week her leg has gotten quite red and painful and she was concerned about the possibility of infection. 10/15/2017 -- she had a fall a couple of times last week and hurt her left toe. Electronic Signature(s) Signed: 11/05/2017 4:13:52 PM By: Evlyn Kanner MD, FACS Entered By: Evlyn Kanner on 11/05/2017 16:13:52 Jessica Donovan ([id number]) -------------------------------------------------------------------------------- Physical Exam Details Patient Name: Jessica Donovan Date of Service: 11/05/2017 3:15 PM Medical Record Number: [id number] Patient Account Number: [id number] Date of Birth/Sex: November 08, 1946 (71 y.o. Female) Treating RN: Phillis Haggis Primary Care Provider: Garlon Hatchet Other Clinician: Referring Provider: Garlon Hatchet Treating Provider/Extender: Rudene Re in Treatment: 17 Constitutional . Pulse regular. Respirations normal and unlabored. Afebrile. . Eyes Nonicteric. Reactive to light. Ears, Nose, Mouth, and Throat Lips, teeth, and gums WNL.[name] Moist mucosa without lesions. Neck supple and nontender. No palpable supraclavicular or cervical adenopathy. Normal sized without goiter. Respiratory WNL. No retractions.. Cardiovascular Pedal Pulses WNL. No clubbing, cyanosis or edema. Lymphatic No adneopathy. No adenopathy. No adenopathy. Musculoskeletal Adexa without tenderness or enlargement.. Digits and nails  w/o clubbing, cyanosis, infection, petechiae, ischemia, or inflammatory conditions.. Integumentary (Hair, Skin) No suspicious lesions. No crepitus or fluctuance. No peri-wound warmth or erythema. No masses.[name] Psychiatric Judgement and insight Intact.. No evidence of depression, anxiety, or agitation.. Notes the right lateral ankle wound did not need sharp debridement but was washed out with moist saline gauze and looks pretty healthy. The left fourth toe needed sharp debridement with a #3 curet and minimal bleeding controlled with pressure. Electronic Signature(s) Signed: 11/05/2017 4:15:42 PM By: Evlyn Kanner MD, FACS Entered By: Evlyn Kanner on 11/05/2017 16:15:42 Jessica Donovan ([id number]) -------------------------------------------------------------------------------- Physician Orders Details Patient Name: Jessica Donovan Date of Service: 11/05/2017 3:15 PM Medical Record Number: [id number] Patient Account Number: [id number] Date of Birth/Sex: 01-15-1946 (71 y.o. Female) Treating RN: Ashok Cordia, Debi Primary Care Provider: Garlon Hatchet Other Clinician: Referring Provider: Garlon Hatchet Treating Provider/Extender: Rudene Re in Treatment: 45 Verbal / Phone Orders: Yes Clinician: Ashok Cordia, Debi Read Back and Verified: Yes Diagnosis Coding Wound Cleansing Wound #2 Right Malleolus o Cleanse wound with mild soap and water o May Shower, gently pat wound dry prior to applying new dressing. o May shower with protection. Wound #4 Left Toe Fourth o Cleanse wound with mild soap and water o May Shower, gently pat wound dry prior to applying new dressing. o May shower with protection. Anesthetic Wound #2 Right Malleolus o Topical Lidocaine 4% cream applied to wound bed prior to debridement Wound #4 Left Toe Fourth o Topical Lidocaine 4% cream applied to wound bed prior to debridement Skin Barriers/Peri-Wound Care Wound #2 Right Malleolus o Skin  Prep o Antifungal cream Primary Wound Dressing Wound #2 Right Malleolus o Prisma Ag Wound #4 Left Toe Fourth o Aquacel Ag Secondary Dressing Wound #2 Right Malleolus o Dry Gauze o Other - telfa island Wound #4 Left Toe Fourth o Dry Gauze o Other - coverlet (band-aide) Dressing Change Frequency Wound #2 Right Malleolus o Change dressing every other day. Jessica Donovan, Jessica Donovan ([id number]) Wound #4 Left Toe Fourth o Change dressing every other day. Follow-up Appointments Wound #2 Right Malleolus o Return Appointment in 1 week. Wound #4 Left Toe Fourth o Return Appointment in 1 week. Edema Control Wound #2 Right Malleolus o Elevate legs to the level of the heart and pump ankles as often as possible  Wound #4 Left Toe Fourth o Elevate legs to the level of the heart and pump ankles as often as possible Additional Orders / Instructions Wound #2 Right Malleolus o Increase protein intake. o Activity as tolerated Wound #4 Left Toe Fourth o Increase protein intake. o Activity as tolerated Electronic Signature(s) Signed: 11/05/2017 4:11:32 PM By: Alejandro MullingPinkerton, Debra Signed: 11/05/2017 4:27:13 PM By: Evlyn KannerBritto, Caedin Mogan MD, FACS Entered By: Alejandro MullingPinkerton, Debra on 11/05/2017 15:34:55 Jessica Donovan, [name] ([id number]) -------------------------------------------------------------------------------- Problem List Details Patient Name: Jessica Donovan, Jessica Donovan Date of Service: 11/05/2017 3:15 PM Medical Record Number: [id number] Patient Account Number: [id number] Date of Birth/Sex: 01-14-1946 (71 y.o. Female) Treating RN: Phillis HaggisPinkerton, Debi Primary Care Provider: Garlon HatchetMCCONVILLE, ROBERT Other Clinician: Referring Provider: Garlon HatchetMCCONVILLE, ROBERT Treating Provider/Extender: Rudene ReBritto, Cady Hafen Weeks in Treatment: 17 Active Problems ICD-10 Encounter Code Description Active Date Diagnosis E11.621 Type 2 diabetes mellitus with foot ulcer 07/08/2017 Yes L97.312 Non-pressure chronic ulcer of right ankle  with fat layer exposed 07/08/2017 Yes L97.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 left ankle with fat layer exposed 07/08/2017 Yes L03.115 Cellulitis of right lower limb 08/09/2017 Yes L97.522 Non-pressure chronic ulcer of other part of left foot with fat layer 10/14/2017 Yes exposed Inactive Problems Resolved Problems Electronic Signature(s) Signed: 11/05/2017 4:13:08 PM By: Evlyn KannerBritto, Laraya Pestka MD, FACS Entered By: Evlyn KannerBritto, Rotunda Worden on 11/05/2017 16:13:08 Jessica Donovan, Jessica Donovan ([id number]) -------------------------------------------------------------------------------- Progress Note Details Patient Name: Jessica Donovan, Jessica Donovan Date of Service: 11/05/2017 3:15 PM Medical Record Number: [id number] Patient Account Number: [id number] Date of Birth/Sex: 01-14-1946 [age] y.o. Female) Treating RN: Ashok CordiaPinkerton, Debi Primary Care Provider: Garlon HatchetMCCONVILLE, ROBERT Other Clinician: Referring Provider: Garlon HatchetMCCONVILLE, ROBERT Treating Provider/Extender: Rudene ReBritto, Darrio Bade Weeks in Treatment: 17 Subjective Chief Complaint Information obtained from Patient Patients presents for treatment of an open diabetic ulcer to the right ankle the left ankle and the right third toe History of Present Illness (HPI) 71 year old diabetic patient who has chronic back pain, collagen vascular disease, coronary artery disease, hypertension and history of osteomyelitis of the toe in February 2017. 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 She also has had joint replacement surgery in the past. She was recently reviewed by Primary care management team and had a problem with the toe on her right foot and her left ankle had minimal drainage, and the right ankle was draining worse. She was seen in the ER on 06/22/2017, and on the x-ray, there was no evidence of any acute bony abnormality. 07/22/2017 -- she says she had a mild abrasion to her right second toe which was inadvertent but there is no open ulceration. 07/30/17 on evaluation today  patient appears to be doing better in regard to her abrasion to the right second toe. It appears there's little bit of a pressure injury although there is no opening at this point. I think this is pushing up on her shoe which is causing the issue at this point and that's due to the contraction that happening with her toe. With that being said patient wonders if there's anything that can be done in that regard for this. She has been tolerating the dressing changes without any complication and is having really no pain. Her right lateral heel wound appears to be slough covered though there is no evidence of infection. No fevers, chills, nausea, or vomiting noted at this time. 08/09/2017 -- since last week her leg has gotten quite red and painful and she was concerned about the possibility of infection. 10/15/2017 -- she had a fall a couple of times last week and hurt  her left toe. Patient History Information obtained from Patient. Family History Stroke - Mother, No family history of Cancer, Diabetes, Heart Disease, Hereditary Spherocytosis, Hypertension, Kidney Disease, Lung Disease, Seizures, Thyroid Problems, Tuberculosis. Social History Never smoker, Marital Status - Single, Alcohol Use - Never, Drug Use - No History, Caffeine Use - Never. Bowermaster, Khalea ([id number]) Objective Constitutional Pulse regular. Respirations normal and unlabored. Afebrile. Vitals Time Taken: 3:20 PM, Height: 66 in, Weight: 191 lbs, BMI: 30.8, Temperature: 98.7 F, Pulse: 73 bpm, Respiratory Rate: 18 breaths/min, Blood Pressure: 112/54 mmHg. Eyes Nonicteric. Reactive to light. Ears, Nose, Mouth, and Throat Lips, teeth, and gums WNL.[name] Moist mucosa without lesions. Neck supple and nontender. No palpable supraclavicular or cervical adenopathy. Normal sized without goiter. Respiratory WNL. No retractions.. Cardiovascular Pedal Pulses WNL. No clubbing, cyanosis or edema. Lymphatic No adneopathy. No adenopathy.  No adenopathy. Musculoskeletal Adexa without tenderness or enlargement.. Digits and nails w/o clubbing, cyanosis, infection, petechiae, ischemia, or inflammatory conditions.[name] Psychiatric Judgement and insight Intact.. No evidence of depression, anxiety, or agitation.. General Notes: the right lateral ankle wound did not need sharp debridement but was washed out with moist saline gauze and looks pretty healthy. The left fourth toe needed sharp debridement with a #3 curet and minimal bleeding controlled with pressure. Integumentary (Hair, Skin) No suspicious lesions. No crepitus or fluctuance. No peri-wound warmth or erythema. No masses.. Wound #2 status is Open. Original cause of wound was Gradually Appeared. The wound is located on the Right Malleolus. The wound measures 0.3cm length x 0.3cm width x 0.2cm depth; 0.071cm^2 area and 0.014cm^3 volume. There is no tunneling or undermining noted. There is a large amount of serous drainage noted. The wound margin is flat and intact. There is large (67-100%) red granulation within the wound bed. There is a small (1-33%) amount of necrotic tissue within the wound bed including Adherent Slough. The periwound skin appearance exhibited: Rubor, Erythema. The surrounding wound skin color is noted with erythema which is circumferential. Periwound temperature was noted as No Abnormality. The periwound has tenderness on palpation. Wound #4 status is Open. Original cause of wound was Trauma. The wound is located on the Left Toe Fourth. The wound measures 0.8cm length x 1.3cm width x 0.1cm depth; 0.817cm^2 area and 0.082cm^3 volume. There is no tunneling or undermining noted. There is a large amount of serosanguineous drainage noted. The wound margin is distinct with the outline attached to the wound base. There is small (1-33%) red granulation within the wound bed. There is a large (67-100%) amount of necrotic tissue within the wound bed including Eschar and  Adherent Slough. The periwound skin appearance exhibited: Maceration, Erythema. The surrounding wound skin color is noted with erythema which is circumferential. Periwound temperature was noted as No Abnormality. The periwound has tenderness on palpation. Wound #5 status is Open. Original cause of wound was Gradually Appeared. The wound is located on the Right Toe Fourth. Jessica Donovan, Jessica Donovan ([id number]) The wound measures 0cm length x 0cm width x 0cm depth; 0cm^2 area and 0cm^3 volume. There is no tunneling or undermining noted. There is a none present amount of drainage noted. The wound margin is distinct with the outline attached to the wound base. There is no granulation within the wound bed. There is no necrotic tissue within the wound bed. The periwound skin appearance did not exhibit: Maceration. Periwound temperature was noted as No Abnormality. The periwound has tenderness on palpation. Assessment Active Problems ICD-10 E11.621 - Type 2 diabetes mellitus with foot ulcer L97.312 -  Non-pressure chronic ulcer of right ankle with fat layer exposed L97.322 - Non-pressure chronic ulcer of left ankle with fat layer exposed L03.115 - Cellulitis of right lower limb L97.522 - Non-pressure chronic ulcer of other part of left foot with fat layer exposed Procedures Wound #4 Pre-procedure diagnosis of Wound #4 is a Trauma, Other located on the Left Toe Fourth . There was a Skin/Subcutaneous Tissue Debridement ([id number]) debridement with total area of 1.04 sq cm performed by Evlyn Kanner, MD. with the following instrument(s): Curette to remove Viable and Non-Viable tissue/material including Exudate, Fibrin/Slough, and Subcutaneous after achieving pain control using Lidocaine 4% Topical Solution. A time out was conducted at 15:29, prior to the start of the procedure. A Minimum amount of bleeding was controlled with Pressure. The procedure was tolerated well with a pain level of 0 throughout and a  pain level of 0 following the procedure. Post Debridement Measurements: 0.9cm length x 1.4cm width x 0.1cm depth; 0.099cm^3 volume. Character of Wound/Ulcer Post Debridement requires further debridement. Post procedure Diagnosis Wound #4: Same as Pre-Procedure Plan Wound Cleansing: Wound #2 Right Malleolus: Cleanse wound with mild soap and water May Shower, gently pat wound dry prior to applying new dressing. May shower with protection. Wound #4 Left Toe Fourth: Cleanse wound with mild soap and water May Shower, gently pat wound dry prior to applying new dressing. May shower with protection. Anesthetic: Wound #2 Right Malleolus: Topical Lidocaine 4% cream applied to wound bed prior to debridement Wound #4 Left Toe Fourth: Rinehimer, Jessica Donovan ([id number]) Topical Lidocaine 4% cream applied to wound bed prior to debridement Skin Barriers/Peri-Wound Care: Wound #2 Right Malleolus: Skin Prep Antifungal cream Primary Wound Dressing: Wound #2 Right Malleolus: Prisma Ag Wound #4 Left Toe Fourth: Aquacel Ag Secondary Dressing: Wound #2 Right Malleolus: Dry Gauze Other - telfa island Wound #4 Left Toe Fourth: Dry Gauze Other - coverlet (band-aide) Dressing Change Frequency: Wound #2 Right Malleolus: Change dressing every other day. Wound #4 Left Toe Fourth: Change dressing every other day. Follow-up Appointments: Wound #2 Right Malleolus: Return Appointment in 1 week. Wound #4 Left Toe Fourth: Return Appointment in 1 week. Edema Control: Wound #2 Right Malleolus: Elevate legs to the level of the heart and pump ankles as often as possible Wound #4 Left Toe Fourth: Elevate legs to the level of the heart and pump ankles as often as possible Additional Orders / Instructions: Wound #2 Right Malleolus: Increase protein intake. Activity as tolerated Wound #4 Left Toe Fourth: Increase protein intake. Activity as tolerated after sharp debridement and review today, I have  recommended: 1. Prisma AG to the right ankle and the right fourth toe 2. a bordered foam to her left lateral ankle and Silver alginate to the left fourth toe 3. good control of her diabetes mellitus 4. Adequate offloading of both these areas Electronic Signature(s) Signed: 11/05/2017 4:16:24 PM By: Evlyn Kanner MD, FACS Entered By: Evlyn Kanner on 11/05/2017 16:16:24 Jessica Donovan ([id number]) -------------------------------------------------------------------------------- ROS/PFSH Details Patient Name: Jessica Donovan Date of Service: 11/05/2017 3:15 PM Medical Record Number: [id number] Patient Account Number: [id number] Date of Birth/Sex: 09-24-1946 (71 y.o. Female) Treating RN: Phillis Haggis Primary Care Provider: Garlon Hatchet Other Clinician: Referring Provider: Garlon Hatchet Treating Provider/Extender: Rudene Re in Treatment: 17 Information Obtained From Patient Wound History Do you currently have one or more open woundso Yes How many open wounds do you currently haveo 3 How have you been treating your wound(s) until nowo santyl Has your wound(s) ever healed and then re-openedo  No Have you had any lab work done in the past montho No Have you tested positive for an antibiotic resistant organism (MRSA, VRE)o No Have you tested positive for osteomyelitis (bone infection)o No Have you had any tests for circulation on your legso No Have you had other problems associated with your woundso Infection Eyes Medical History: Negative for: Cataracts; Glaucoma; Optic Neuritis Ear/Nose/Mouth/Throat Medical History: Negative for: Chronic sinus problems/congestion; Middle ear problems Hematologic/Lymphatic Medical History: Negative for: Anemia; Hemophilia; Human Immunodeficiency Virus; Lymphedema; Sickle Cell Disease Respiratory Medical History: Positive for: Sleep Apnea Negative for: Aspiration; Asthma; Chronic Obstructive Pulmonary Disease (COPD);  Pneumothorax; Tuberculosis Cardiovascular Medical History: Positive for: Coronary Artery Disease; Hypertension; Myocardial Infarction Gastrointestinal Medical History: Negative for: Cirrhosis ; Colitis; Crohnos; Hepatitis A; Hepatitis B; Hepatitis C Endocrine Medical History: Positive for: Type II Diabetes Time with diabetes: 20 Jessica Donovan, Jessica Donovan ([id number]) Treated with: Insulin Blood sugar tested every day: No Blood sugar testing results: Breakfast: 178 Immunological Medical History: Negative for: Lupus Erythematosus; Raynaudos; Scleroderma Integumentary (Skin) Medical History: Positive for: History of pressure wounds Negative for: History of Burn Musculoskeletal Medical History: Positive for: Osteoarthritis Negative for: Gout; Rheumatoid Arthritis; Osteomyelitis Neurologic Medical History: Positive for: Neuropathy Negative for: Dementia; Quadriplegia; Paraplegia; Seizure Disorder Oncologic Medical History: Negative for: Received Chemotherapy; Received Radiation Psychiatric Medical History: Positive for: Confinement Anxiety Negative for: Anorexia/bulimia Immunizations Pneumococcal Vaccine: Received Pneumococcal Vaccination: No Implantable Devices Family and Social History Cancer: No; Diabetes: No; Heart Disease: No; Hereditary Spherocytosis: No; Hypertension: No; Kidney Disease: No; Lung Disease: No; Seizures: No; Stroke: Yes - Mother; Thyroid Problems: No; Tuberculosis: No; Never smoker; Marital Status - Single; Alcohol Use: Never; Drug Use: No History; Caffeine Use: Never; Financial Concerns: No; Food, Clothing or Shelter Needs: No; Support System Lacking: No; Transportation Concerns: No; Advanced Directives: No; Patient does not want information on Advanced Directives; Living Will: No Physician Affirmation I have reviewed and agree with the above information. Electronic Signature(s) Signed: 11/05/2017 4:27:13 PM By: Evlyn KannerBritto, Daishia Fetterly MD, FACS Signed: 11/05/2017  4:51:20 PM By: Brayton LaymanPinkerton, Debra Jessica Donovan, [name] ([id number]) Entered By: Evlyn KannerBritto, Salia Cangemi on 11/05/2017 16:14:00 Jessica Donovan, Leone ([id number]) -------------------------------------------------------------------------------- SuperBill Details Patient Name: Jessica Donovan, Raisha Date of Service: 11/05/2017 Medical Record Number: [id number] Patient Account Number: [id number] Date of Birth/Sex: Aug 14, 1946 [age] y.o. 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 L03.115 Cellulitis of right lower limb L97.522 Non-pressure chronic ulcer of other part of left foot with fat layer exposed Facility Procedures CPT4 Code: [id number] Description: 11042 - DEB SUBQ TISSUE 20 SQ CM/< ICD-10 Diagnosis Description E11.621 Type 2 diabetes mellitus with 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 part of left foot with fat Modifier: ed d layer exposed Quantity: 1 Physician Procedures CPT4 Code: [id number] Description: 11042 - WC PHYS SUBQ TISS 20 SQ CM ICD-10 Diagnosis Description E11.621 Type 2 diabetes mellitus with 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 part of left foot with fat Modifier: ed d layer  exposed Quantity: 1 Electronic Signature(s) Signed: 11/05/2017 4:16:49 PM By: Evlyn Kanner MD, FACS Entered By: Evlyn Kanner  on 11/05/2017 16:16:49

## 2017-11-16 DIAGNOSIS — M25561 Pain in right knee: Secondary | ICD-10-CM | POA: Diagnosis not present

## 2017-11-19 ENCOUNTER — Encounter: Payer: PPO | Admitting: Surgery

## 2017-11-19 DIAGNOSIS — E11621 Type 2 diabetes mellitus with foot ulcer: Secondary | ICD-10-CM | POA: Diagnosis not present

## 2017-11-19 DIAGNOSIS — S91105A Unspecified open wound of left lesser toe(s) without damage to nail, initial encounter: Secondary | ICD-10-CM | POA: Diagnosis not present

## 2017-11-21 NOTE — Progress Notes (Signed)
Jessica Donovan, Jessica Donovan ([id number]) Visit Report for 11/19/2017 Chief Complaint Document Details Patient Name: Jessica Donovan, Jessica Donovan Date of Service: 11/19/2017 3:30 PM Medical Record Number: [id number] Patient Account Number: [id number] Date of Birth/Sex: 07/24/1946 (71 y.o. Female) Treating RN: Phillis HaggisPinkerton, Debi Primary Care Provider: Garlon HatchetMCCONVILLE, ROBERT Other Clinician: Referring Provider: Garlon HatchetMCCONVILLE, ROBERT Treating Provider/Extender: Rudene ReBritto, Timon Geissinger Weeks in Treatment: 3019 Information Obtained from: Patient Chief Complaint Patients presents for treatment of an open diabetic ulcer to the right ankle the left ankle and the right third toe Electronic Signature(s) Signed: 11/19/2017 4:34:17 PM By: Evlyn KannerBritto, Deantre Bourdon MD, FACS Entered By: Evlyn KannerBritto, Libero Puthoff on 11/19/2017 16:34:17 Jessica Donovan, Jessica Donovan ([id number]) -------------------------------------------------------------------------------- Debridement Details Patient Name: Jessica Donovan, Jessica Donovan Date of Service: 11/19/2017 3:30 PM Medical Record Number: [id number] Patient Account Number: [id number] Date of Birth/Sex: 07/24/1946 (71 y.o. Female) Treating RN: Ashok CordiaPinkerton, Debi Primary Care Provider: Garlon HatchetMCCONVILLE, ROBERT Other Clinician: Referring Provider: Garlon HatchetMCCONVILLE, ROBERT Treating Provider/Extender: Rudene ReBritto, Jamise Pentland Weeks in Treatment: 19 Debridement Performed for Wound #4 Left Toe Fourth Assessment: Performed By: Physician Evlyn KannerBritto, Jceon Alverio, MD Debridement: Debridement Pre-procedure Verification/Time Yes - 15:31 Out Taken: Start Time: 15:30 Pain Control: Lidocaine 4% Topical Solution Level: Skin/Subcutaneous Tissue Total Area Debrided (L x W): 0.5 (cm) x 0.5 (cm) = 0.25 (cm) Tissue and other material Viable, Non-Viable, Exudate, Fibrin/Slough, Subcutaneous debrided: Instrument: Forceps, Scissors Bleeding: Minimum Hemostasis Achieved: Pressure End Time: 15:36 Procedural Pain: 0 Post Procedural Pain: 0 Response to Treatment: Procedure was tolerated well Post Debridement  Measurements of Total Wound Length: (cm) 0.6 Width: (cm) 0.6 Depth: (cm) 0.2 Volume: (cm) 0.057 Character of Wound/Ulcer Post Debridement: Requires Further Debridement Post Procedure Diagnosis Same as Pre-procedure Electronic Signature(s) Signed: 11/19/2017 4:34:08 PM By: Evlyn KannerBritto, Martesha Niedermeier MD, FACS Signed: 11/19/2017 4:39:39 PM By: Alejandro MullingPinkerton, Debra Entered By: Evlyn KannerBritto, Kammie Scioli on 11/19/2017 16:34:08 Jessica Donovan, Jessica Donovan ([id number]) -------------------------------------------------------------------------------- HPI Details Patient Name: Jessica Donovan, Jessica Donovan Date of Service: 11/19/2017 3:30 PM Medical Record Number: [id number] Patient Account Number: [id number] Date of Birth/Sex: 07/24/1946 [age] y.o. Female) Treating RN: Phillis HaggisPinkerton, Debi Primary Care Provider: Garlon HatchetMCCONVILLE, ROBERT Other Clinician: Referring Provider: Garlon HatchetMCCONVILLE, ROBERT Treating Provider/Extender: Rudene ReBritto, Sheranda Seabrooks Weeks in Treatment: 3619 History of Present Illness HPI Description: 71 year old diabetic patient who has chronic back pain, collagen vascular disease, coronary artery disease, hypertension and history of osteomyelitis of the toe in February 2017. 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 She also has had joint replacement surgery in the past. She was recently reviewed by Primary care management team and had a problem with the toe on her right foot and her left ankle had minimal drainage, and the right ankle was draining worse. She was seen in the ER on 06/22/2017, and on the x-ray, there was no evidence of any acute bony abnormality. 07/22/2017 -- she says she had a mild abrasion to her right second toe which was inadvertent but there is no open ulceration. 07/30/17 on evaluation today patient appears to be doing better in regard to her abrasion to the right second toe. It appears there's little bit of a pressure injury although there is no opening at this point. I think this is pushing up on her shoe which  is causing the issue at this point and that's due to the contraction that happening with her toe. With that being said patient wonders if there's anything that can be done in that regard for this. She has been tolerating the dressing changes without any complication and is having really no pain. Her right lateral heel wound appears to be slough covered though there  is no evidence of infection. No fevers, chills, nausea, or vomiting noted at this time. 08/09/2017 -- since last week her leg has gotten quite red and painful and she was concerned about the possibility of infection. 10/15/2017 -- she had a fall a couple of times last week and hurt her left toe. Electronic Signature(s) Signed: 11/19/2017 4:34:21 PM By: Evlyn Kanner MD, FACS Entered By: Evlyn Kanner on 11/19/2017 16:34:21 Jessica Donovan, Jessica Donovan ([id number]) -------------------------------------------------------------------------------- Physical Exam Details Patient Name: Jessica Donovan Date of Service: 11/19/2017 3:30 PM Medical Record Number: [id number] Patient Account Number: [id number] Date of Birth/Sex: October 24, 1946 (71 y.o. Female) Treating RN: Phillis Haggis Primary Care Provider: Garlon Hatchet Other Clinician: Referring Provider: Garlon Hatchet Treating Provider/Extender: Rudene Re in Treatment: 19 Constitutional . Pulse regular. Respirations normal and unlabored. Afebrile. . Eyes Nonicteric. Reactive to light. Ears, Nose, Mouth, and Throat Lips, teeth, and gums WNL.[name] Moist mucosa without lesions. Neck supple and nontender. No palpable supraclavicular or cervical adenopathy. Normal sized without goiter. Respiratory WNL. No retractions.. Cardiovascular Pedal Pulses WNL. No clubbing, cyanosis or edema. Lymphatic No adneopathy. No adenopathy. No adenopathy. Musculoskeletal Adexa without tenderness or enlargement.. Digits and nails w/o clubbing, cyanosis, infection, petechiae, ischemia, or inflammatory  conditions.. Integumentary (Hair, Skin) No suspicious lesions. No crepitus or fluctuance. No peri-wound warmth or erythema. No masses.[name] Psychiatric Judgement and insight Intact.. No evidence of depression, anxiety, or agitation.. Notes the right lateral ankle looks very good and did not need any sharp debridement. The left fourth toe needed sharp debridement with forceps and scissors and after doing this and the wound saucerized is looking very good. Minimal bleeding controlled with pressure. Electronic Signature(s) Signed: 11/19/2017 4:35:05 PM By: Evlyn Kanner MD, FACS Entered By: Evlyn Kanner on 11/19/2017 16:35:05 Jessica Donovan ([id number]) -------------------------------------------------------------------------------- Physician Orders Details Patient Name: Jessica Donovan Date of Service: 11/19/2017 3:30 PM Medical Record Number: [id number] Patient Account Number: [id number] Date of Birth/Sex: 1946-06-15 (71 y.o. Female) Treating RN: Ashok Cordia, Debi Primary Care Provider: Garlon Hatchet Other Clinician: Referring Provider: Garlon Hatchet Treating Provider/Extender: Rudene Re in Treatment: 10 Verbal / Phone Orders: Yes Clinician: Ashok Cordia, Debi Read Back and Verified: Yes Diagnosis Coding Wound Cleansing Wound #2 Right Malleolus o Cleanse wound with mild soap and water o May Shower, gently pat wound dry prior to applying new dressing. o May shower with protection. Wound #4 Left Toe Fourth o Cleanse wound with mild soap and water o May Shower, gently pat wound dry prior to applying new dressing. o May shower with protection. Anesthetic Wound #2 Right Malleolus o Topical Lidocaine 4% cream applied to wound bed prior to debridement Wound #4 Left Toe Fourth o Topical Lidocaine 4% cream applied to wound bed prior to debridement Skin Barriers/Peri-Wound Care Wound #2 Right Malleolus o Skin Prep o Antifungal cream Primary Wound  Dressing Wound #2 Right Malleolus o Prisma Ag Wound #4 Left Toe Fourth o Silvercel Non-Adherent Secondary Dressing Wound #2 Right Malleolus o Dry Gauze o Other - telfa island Wound #4 Left Toe Fourth o Dry Gauze o Other - coverlet (band-aide) Dressing Change Frequency Wound #2 Right Malleolus o Change dressing every other day. Lacosse, Terrie ([id number]) Wound #4 Left Toe Fourth o Change dressing every other day. Follow-up Appointments Wound #2 Right Malleolus o Return Appointment in 1 week. Wound #4 Left Toe Fourth o Return Appointment in 1 week. Edema Control Wound #2 Right Malleolus o Elevate legs to the level of the heart and pump ankles as often as possible Wound #4 Left Toe Fourth   o Elevate legs to the level of the heart and pump ankles as often as possible Additional Orders / Instructions Wound #2 Right Malleolus o Increase protein intake. o Activity as tolerated Wound #4 Left Toe Fourth o Increase protein intake. o Activity as tolerated Electronic Signature(s) Signed: 11/19/2017 4:39:39 PM By: Alejandro Mulling Signed: 11/19/2017 4:42:19 PM By: Evlyn Kanner MD, FACS Entered By: Alejandro Mulling on 11/19/2017 15:37:33 Jessica Donovan, Jessica Donovan ([id number]) -------------------------------------------------------------------------------- Problem List Details Patient Name: Jessica Donovan Date of Service: 11/19/2017 3:30 PM Medical Record Number: [id number] Patient Account Number: [id number] Date of Birth/Sex: 1946-12-13 (71 y.o. Female) Treating RN: Phillis Haggis Primary Care Provider: Garlon Hatchet Other Clinician: Referring Provider: Garlon Hatchet Treating Provider/Extender: Rudene Re in Treatment: 8 Active Problems ICD-10 Encounter Code Description Active Date Diagnosis E11.621 Type 2 diabetes mellitus with foot ulcer 07/08/2017 Yes L97.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 layer exposed 07/08/2017  Yes L97.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 left ankle with fat layer exposed 07/08/2017 Yes L03.115 Cellulitis of right lower limb 08/09/2017 Yes L97.522 Non-pressure chronic ulcer of other part of left foot with fat layer 10/14/2017 Yes exposed Inactive Problems Resolved Problems Electronic Signature(s) Signed: 11/19/2017 4:33:53 PM By: Evlyn Kanner MD, FACS Entered By: Evlyn Kanner on 11/19/2017 16:33:52 Jessica Donovan ([id number]) -------------------------------------------------------------------------------- Progress Note Details Patient Name: Jessica Donovan Date of Service: 11/19/2017 3:30 PM Medical Record Number: [id number] Patient Account Number: [id number] Date of Birth/Sex: 09/22/1946 (71 y.o. Female) Treating RN: Ashok Cordia, Debi Primary Care Provider: Garlon Hatchet Other Clinician: Referring Provider: Garlon Hatchet Treating Provider/Extender: Rudene Re in Treatment: 19 Subjective Chief Complaint Information obtained from Patient Patients presents for treatment of an open diabetic ulcer to the right ankle the left ankle and the right third toe History of Present Illness (HPI) 71 year old diabetic patient who has chronic back pain, collagen vascular disease, coronary artery disease, hypertension and history of osteomyelitis of the toe in February 2017. 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 She also has had joint replacement surgery in the past. She was recently reviewed by Primary care management team and had a problem with the toe on her right foot and her left ankle had minimal drainage, and the right ankle was draining worse. She was seen in the ER on 06/22/2017, and on the x-ray, there was no evidence of any acute bony abnormality. 07/22/2017 -- she says she had a mild abrasion to her right second toe which was inadvertent but there is no open ulceration. 07/30/17 on evaluation today patient appears to be doing better in  regard to her abrasion to the right second toe. It appears there's little bit of a pressure injury although there is no opening at this point. I think this is pushing up on her shoe which is causing the issue at this point and that's due to the contraction that happening with her toe. With that being said patient wonders if there's anything that can be done in that regard for this. She has been tolerating the dressing changes without any complication and is having really no pain. Her right lateral heel wound appears to be slough covered though there is no evidence of infection. No fevers, chills, nausea, or vomiting noted at this time. 08/09/2017 -- since last week her leg has gotten quite red and painful and she was concerned about the possibility of infection. 10/15/2017 -- she had a fall a couple of times last week and hurt her left toe. Patient History  Information obtained from Patient. Family History Stroke - Mother, No family history of Cancer, Diabetes, Heart Disease, Hereditary Spherocytosis, Hypertension, Kidney Disease, Lung Disease, Seizures, Thyroid Problems, Tuberculosis. Social History Never smoker, Marital Status - Single, Alcohol Use - Never, Drug Use - No History, Caffeine Use - Never. Peek, Miaisabella ([id number]) Objective Constitutional Pulse regular. Respirations normal and unlabored. Afebrile. Vitals Time Taken: 3:10 PM, Height: 66 in, Weight: 191 lbs, BMI: 30.8, Temperature: 98.4 F, Pulse: 77 bpm, Respiratory Rate: 18 breaths/min, Blood Pressure: 139/57 mmHg. Eyes Nonicteric. Reactive to light. Ears, Nose, Mouth, and Throat Lips, teeth, and gums WNL.[name] Moist mucosa without lesions. Neck supple and nontender. No palpable supraclavicular or cervical adenopathy. Normal sized without goiter. Respiratory WNL. No retractions.. Cardiovascular Pedal Pulses WNL. No clubbing, cyanosis or edema. Lymphatic No adneopathy. No adenopathy. No adenopathy. Musculoskeletal Adexa  without tenderness or enlargement.. Digits and nails w/o clubbing, cyanosis, infection, petechiae, ischemia, or inflammatory conditions.[name] Psychiatric Judgement and insight Intact.. No evidence of depression, anxiety, or agitation.. General Notes: the right lateral ankle looks very good and did not need any sharp debridement. The left fourth toe needed sharp debridement with forceps and scissors and after doing this and the wound saucerized is looking very good. Minimal bleeding controlled with pressure. Integumentary (Hair, Skin) No suspicious lesions. No crepitus or fluctuance. No peri-wound warmth or erythema. No masses.. Wound #2 status is Open. Original cause of wound was Gradually Appeared. The wound is located on the Right Malleolus. The wound measures 0.3cm length x 0.3cm width x 0.1cm depth; 0.071cm^2 area and 0.007cm^3 volume. There is no tunneling or undermining noted. There is a large amount of serous drainage noted. The wound margin is flat and intact. There is large (67-100%) red granulation within the wound bed. There is a small (1-33%) amount of necrotic tissue within the wound bed including Adherent Slough. The periwound skin appearance exhibited: Excoriation, Rubor, Erythema. The surrounding wound skin color is noted with erythema which is circumferential. Periwound temperature was noted as No Abnormality. The periwound has tenderness on palpation. Wound #4 status is Open. Original cause of wound was Trauma. The wound is located on the Left Toe Fourth. The wound measures 0.5cm length x 0.5cm width x 0.2cm depth; 0.196cm^2 area and 0.039cm^3 volume. There is no tunneling or undermining noted. There is a large amount of serosanguineous drainage noted. The wound margin is distinct with the outline attached to the wound base. There is medium (34-66%) red granulation within the wound bed. There is a medium (34-66%) amount of necrotic tissue within the wound bed including Eschar and  Adherent Slough. The periwound skin appearance exhibited: Maceration, Erythema. The surrounding wound skin color is noted with erythema which is circumferential. Periwound temperature was noted as No Abnormality. The periwound has tenderness on palpation. 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 There was a Skin/Subcutaneous Tissue Debridement ([id number]) debridement with total area of 0.25 sq cm performed by Evlyn Kanner, MD. with the following instrument(s): Forceps and Scissors to remove Viable and Non-Viable tissue/material including Exudate, Fibrin/Slough, and Subcutaneous after achieving pain control  using Lidocaine 4% Topical Solution. A time out was conducted at 15:31, prior to the start of the procedure. A Minimum amount of bleeding was controlled with Pressure. The procedure was tolerated well with a pain level of 0 throughout and a pain level of 0 following the procedure. Post Debridement Measurements: 0.6cm length x 0.6cm width x 0.2cm depth; 0.057cm^3 volume. Character of Wound/Ulcer Post Debridement requires further debridement. Post procedure Diagnosis Wound #4: Same as Pre-Procedure Plan Wound Cleansing: Wound #2 Right Malleolus: Cleanse wound with mild soap and water May Shower, gently pat wound dry prior to applying new dressing. May shower with protection. Wound #4 Left Toe Fourth: Cleanse wound with mild soap and water May Shower, gently pat wound dry prior to applying new dressing. May shower with  protection. Anesthetic: Wound #2 Right Malleolus: Topical Lidocaine 4% cream applied to wound bed prior to debridement Wound #4 Left Toe Fourth: Topical Lidocaine 4% cream applied to wound bed prior to debridement Skin Barriers/Peri-Wound Care: Wound #2 Right Malleolus: Skin Prep Antifungal cream Primary Wound Dressing: Wound #2 Right Malleolus: Stolarz, Bob ([id number]) Prisma Ag Wound #4 Left Toe Fourth: Silvercel Non-Adherent Secondary Dressing: Wound #2 Right Malleolus: Dry Gauze Other - telfa island Wound #4 Left Toe Fourth: Dry Gauze Other - coverlet (band-aide) Dressing Change Frequency: Wound #2 Right Malleolus: Change dressing every other day. Wound #4 Left Toe Fourth: Change dressing every other day. Follow-up Appointments: Wound #2 Right Malleolus: Return Appointment in 1 week. Wound #4 Left Toe Fourth: Return Appointment in 1 week. Edema Control: Wound #2 Right Malleolus: Elevate legs to the level of the heart and pump ankles as often as possible Wound #4 Left Toe Fourth: Elevate legs to the level of the heart and pump ankles as often as possible Additional Orders / Instructions: Wound #2 Right Malleolus: Increase protein intake. Activity as tolerated Wound #4 Left Toe Fourth: Increase protein intake. Activity as tolerated after sharp debridement and review today, I have recommended: 1. Prisma AG to the right ankle and the right fourth toe 2. a bordered foam to her left lateral ankle and Silver alginate to the left fourth toe 3. good control of her diabetes mellitus 4. Adequate offloading of both these areas Electronic Signature(s) Signed: 11/19/2017 4:35:38 PM By: Evlyn Kanner MD, FACS Entered By: Evlyn Kanner on 11/19/2017 16:35:38 Jessica Donovan ([id number]) -------------------------------------------------------------------------------- ROS/PFSH Details Patient Name: Jessica Donovan Date of Service: 11/19/2017 3:30 PM Medical Record Number:  [id number] Patient Account Number: [id number] Date of Birth/Sex: 03-May-1946 (71 y.o. Female) Treating RN: Ashok Cordia, Debi Primary Care Provider: Garlon Hatchet Other Clinician: Referring Provider: Garlon Hatchet Treating Provider/Extender: Rudene Re in Treatment: 33 Information Obtained From Patient Wound History Do you currently have one or more open woundso Yes How many open wounds do you currently haveo 3 How have you been treating your wound(s) until nowo santyl Has your wound(s) ever healed and then re-openedo No Have you had any lab work done in the past montho No Have you tested positive for an antibiotic resistant organism (MRSA, VRE)o No Have you tested positive for osteomyelitis (bone infection)o No Have you had any tests for circulation on your legso No Have you had other problems associated with your woundso Infection Eyes Medical History: Negative for: Cataracts; Glaucoma; Optic Neuritis Ear/Nose/Mouth/Throat Medical History: Negative for: Chronic sinus problems/congestion; Middle ear problems Hematologic/Lymphatic Medical History: Negative for: Anemia; Hemophilia; Human Immunodeficiency Virus; Lymphedema; Sickle Cell Disease Respiratory Medical History: Positive for: Sleep Apnea Negative for: Aspiration; Asthma; Chronic Obstructive Pulmonary  Disease (COPD); Pneumothorax; Tuberculosis Cardiovascular Medical History: Positive for: Coronary Artery Disease; Hypertension; Myocardial Infarction Gastrointestinal Medical History: Negative for: Cirrhosis ; Colitis; Crohnos; Hepatitis A; Hepatitis B; Hepatitis C Endocrine Medical History: Positive for: Type II Diabetes Time with diabetes: 20 Jessica Donovan, Jessica Donovan ([id number]) Treated with: Insulin Blood sugar tested every day: No Blood sugar testing results: Breakfast: 178 Immunological Medical History: Negative for: Lupus Erythematosus; Raynaudos; Scleroderma Integumentary (Skin) Medical  History: Positive for: History of pressure wounds Negative for: History of Burn Musculoskeletal Medical History: Positive for: Osteoarthritis Negative for: Gout; Rheumatoid Arthritis; Osteomyelitis Neurologic Medical History: Positive for: Neuropathy Negative for: Dementia; Quadriplegia; Paraplegia; Seizure Disorder Oncologic Medical History: Negative for: Received Chemotherapy; Received Radiation Psychiatric Medical History: Positive for: Confinement Anxiety Negative for: Anorexia/bulimia Immunizations Pneumococcal Vaccine: Received Pneumococcal Vaccination: No Implantable Devices Family and Social History Cancer: No; Diabetes: No; Heart Disease: No; Hereditary Spherocytosis: No; Hypertension: No; Kidney Disease: No; Lung Disease: No; Seizures: No; Stroke: Yes - Mother; Thyroid Problems: No; Tuberculosis: No; Never smoker; Marital Status - Single; Alcohol Use: Never; Drug Use: No History; Caffeine Use: Never; Financial Concerns: No; Food, Clothing or Shelter Needs: No; Support System Lacking: No; Transportation Concerns: No; Advanced Directives: No; Patient does not want information on Advanced Directives; Living Will: No Physician Affirmation I have reviewed and agree with the above information. Electronic Signature(s) Signed: 11/19/2017 4:39:39 PM By: Alejandro MullingPinkerton, Debra Signed: 11/19/2017 4:42:19 PM By: Evlyn KannerBritto, Deijah Spikes MD, FACS [place] Country ClubGL[place], [name] ([id number]) Entered By: Evlyn KannerBritto, Zaray Gatchel on 11/19/2017 16:34:30 Carmon, [name] ([id number]) -------------------------------------------------------------------------------- SuperBill Details Patient Name: Jessica Donovan, Drue Date of Service: 11/19/2017 Medical Record Number: [id number] Patient Account Number: [id number] Date of Birth/Sex: [birth year](71 y.o. 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.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 left ankle with fat layer exposed L03.115 Cellulitis of right lower limb L97.522 Non-pressure chronic ulcer of other part of left foot with fat layer exposed Facility Procedures CPT4 Code: [id number] Description: 11042 - DEB SUBQ TISSUE 20 SQ CM/< ICD-10 Diagnosis Description E11.621 Type 2 diabetes mellitus with 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 part of left foot with fat Modifier: ed d layer exposed Quantity: 1 Physician Procedures CPT4 Code: [id number] Description: 11042 - WC PHYS SUBQ TISS 20 SQ CM ICD-10 Diagnosis Description E11.621 Type 2 diabetes mellitus with 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

## 2017-11-21 NOTE — Progress Notes (Signed)
HAILA, DENA ([id number]) Visit Report for 11/19/2017 Arrival Information Details Patient Name: Jessica, Donovan Date of Service: 11/19/2017 3:30 PM Medical Record Number: [id number] Patient Account Number: [id number] Date of Birth/Sex: 1946/11/02 (71 y.o. Female) Treating RN: Ashok Cordia, Debi Primary Care Hawke Villalpando: Garlon Hatchet Other Clinician: Referring Mykalah Saari: Garlon Hatchet Treating Kaylynne Andres/Extender: Rudene Re in Treatment: 19 Visit Information History Since Last Visit All ordered tests and consults were completed: No Patient Arrived: Walker Added or deleted any medications: No Arrival Time: 15:09 Any new allergies or adverse reactions: No Accompanied By: self Had a fall or experienced change in No Transfer Assistance: EasyPivot Patient activities of daily living that may affect Lift risk of falls: Patient Identification Verified: Yes Signs or symptoms of abuse/neglect since last visito No Secondary Verification Process Yes Hospitalized since last visit: No Completed: Has Dressing in Place as Prescribed: Yes Patient Requires Transmission-Based No Precautions: Pain Present Now: Yes Patient Has Alerts: Yes Patient Alerts: Patient on Blood Thinner ASA Electronic Signature(s) Signed: 11/19/2017 4:39:39 PM By: Alejandro Mulling Entered By: Alejandro Mulling on 11/19/2017 15:10:21 Jessica Donovan ([id number]) -------------------------------------------------------------------------------- Encounter Discharge Information Details Patient Name: Jessica Donovan Date of Service: 11/19/2017 3:30 PM Medical Record Number: [id number] Patient Account Number: [id number] Date of Birth/Sex: 1946/04/02 (71 y.o. Female) Treating RN: Ashok Cordia, Debi Primary Care Aishah Teffeteller: Garlon Hatchet Other Clinician: Referring Whitfield Dulay: Garlon Hatchet Treating Dasiah Hooley/Extender: Rudene Re in Treatment: 20 Encounter Discharge Information Items Discharge Pain Level:  0 Discharge Condition: Stable Ambulatory Status: Walker Discharge Destination: Home Private Transportation: Auto Accompanied By: self Schedule Follow-up Appointment: Yes Medication Reconciliation completed and provided No to Patient/Care Venetia Prewitt: Clinical Summary of Care: Electronic Signature(s) Signed: 11/19/2017 3:25:17 PM By: Alejandro Mulling Entered By: Alejandro Mulling on 11/19/2017 15:25:17 Jessica Donovan ([id number]) -------------------------------------------------------------------------------- Lower Extremity Assessment Details Patient Name: Jessica Donovan Date of Service: 11/19/2017 3:30 PM Medical Record Number: [id number] Patient Account Number: [id number] Date of Birth/Sex: January 23, 1946 (71 y.o. Female) Treating RN: Ashok Cordia, Debi Primary Care Kolette Vey: Garlon Hatchet Other Clinician: Referring Zae Kirtz: Garlon Hatchet Treating Henretter Piekarski/Extender: Rudene Re in Treatment: 19 Vascular Assessment Pulses: Dorsalis Pedis Palpable: [Left:Yes] [Right:Yes] Posterior Tibial Extremity colors, hair growth, and conditions: Extremity Color: [Left:Normal] [Right:Normal] Temperature of Extremity: [Left:Warm] [Right:Warm] Capillary Refill: [Left:< 3 seconds] [Right:< 3 seconds] Toe Nail Assessment Left: Right: Thick: No No Discolored: No No Deformed: Yes Yes Improper Length and Hygiene: No No Electronic Signature(s) Signed: 11/19/2017 4:39:39 PM By: Alejandro Mulling Entered By: Alejandro Mulling on 11/19/2017 15:20:05 Jessica Donovan, Jessica Donovan ([id number]) -------------------------------------------------------------------------------- Multi Wound Chart Details Patient Name: Jessica Donovan Date of Service: 11/19/2017 3:30 PM Medical Record Number: [id number] Patient Account Number: [id number] Date of Birth/Sex: June 30, 1946 (71 y.o. Female) Treating RN: Ashok Cordia, Debi Primary Care Kaleigha Chamberlin: Garlon Hatchet Other Clinician: Referring Eleftherios Dudenhoeffer: Garlon Hatchet Treating Skarlette Lattner/Extender: Rudene Re in Treatment: 19 Vital Signs Height(in): 66 Pulse(bpm): 77 Weight(lbs): 191 Blood Pressure(mmHg): 139/57 Body Mass Index(BMI): 31 Temperature(F): 98.4 Respiratory Rate 18 (breaths/min): Photos: [2:No Photos] [4:No Photos] [N/A:N/A] Wound Location: [2:Right Malleolus] [4:Left Toe Fourth] [N/A:N/A] Wounding Event: [2:Gradually Appeared] [4:Trauma] [N/A:N/A] Primary Etiology: [2:Diabetic Wound/Ulcer of the Lower Extremity] [4:Trauma, Other] [N/A:N/A] Comorbid History: [2:Sleep Apnea, Coronary Artery Disease, Hypertension, Myocardial Infarction, Type II Diabetes, History of pressure wounds, Osteoarthritis, Neuropathy, Confinement Anxiety] [4:Sleep Apnea, Coronary Artery Disease, Hypertension,  Myocardial Infarction, Type II Diabetes, History of pressure wounds, Osteoarthritis, Neuropathy, Confinement Anxiety] [N/A:N/A] Date Acquired: [2:02/17/2017] [4:10/12/2017] [N/A:N/A] Weeks of Treatment: [2:19] [4:5] [N/A:N/A] Wound Status: [2:Open] [4:Open] [N/A:N/A] Measurements L x W x D [2:0.3x0.3x0.1] [4:0.5x0.5x0.2] [N/A:N/A] (cm) Area (cm) : [  2:0.071] [4:0.196] [N/A:N/A] Volume (cm) : [2:0.007] [4:0.039] [N/A:N/A] % Reduction in Area: [2:94.20%] [4:71.60%] [N/A:N/A] % Reduction in Volume: [2:94.30%] [4:43.50%] [N/A:N/A] Classification: [2:Grade 1] [4:Partial Thickness] [N/A:N/A] Exudate Amount: [2:Large] [4:Large] [N/A:N/A] Exudate Type: [2:Serous] [4:Serosanguineous] [N/A:N/A] Exudate Color: [2:amber] [4:red, brown] [N/A:N/A] Wound Margin: [2:Flat and Intact] [4:Distinct, outline attached] [N/A:N/A] Granulation Amount: [2:Large (67-100%)] [4:Medium (34-66%)] [N/A:N/A] Granulation Quality: [2:Red] [4:Red] [N/A:N/A] Necrotic Amount: [2:Small (1-33%)] [4:Medium (34-66%)] [N/A:N/A] Necrotic Tissue: [2:Adherent Slough] [4:Eschar, Adherent Slough] [N/A:N/A] Epithelialization: [2:None] [4:None] [N/A:N/A] Debridement: [2:N/A]  [4:Debridement (11042-11047)] [N/A:N/A] Pre-procedure [2:N/A] [4:15:31] [N/A:N/A] Verification/Time Out Taken: Pain Control: [2:N/A] [4:Lidocaine 4% Topical Solution N/A] Tissue Debrided: [2:N/A] [4:Fibrin/Slough, Exudates, Subcutaneous] [N/A:N/A] Level: N/A Skin/Subcutaneous Tissue N/A Debridement Area (sq cm): N/A 0.25 N/A Instrument: N/A Forceps, Scissors N/A Bleeding: N/A Minimum N/A Hemostasis Achieved: N/A Pressure N/A Procedural Pain: N/A 0 N/A Post Procedural Pain: N/A 0 N/A Debridement Treatment N/A Procedure was tolerated well N/A Response: Post Debridement N/A 0.6x0.6x0.2 N/A Measurements L x W x D (cm) Post Debridement Volume: N/A 0.057 N/A (cm) Periwound Skin Texture: Excoriation: Yes No Abnormalities Noted N/A Periwound Skin Moisture: No Abnormalities Noted Maceration: Yes N/A Periwound Skin Color: Erythema: Yes Erythema: Yes N/A Rubor: Yes Erythema Location: Circumferential Circumferential N/A Temperature: No Abnormality No Abnormality N/A Tenderness on Palpation: Yes Yes N/A Wound Preparation: Ulcer Cleansing: Ulcer Cleansing: N/A Rinsed/Irrigated with Saline Rinsed/Irrigated with Saline Topical Anesthetic Applied: Topical Anesthetic Applied: Other: lidocaine 4% Other: lidocaine 4% Procedures Performed: N/A Debridement N/A Treatment Notes Wound #2 (Right Malleolus) 1. Cleansed with: Clean wound with Normal Saline 2. Anesthetic Topical Lidocaine 4% cream to wound bed prior to debridement 3. Peri-wound Care: Antifungal cream 4. Dressing Applied: Prisma Ag 5. Secondary Dressing Applied Dry Gauze [place] Wound #4 (Left Toe Fourth) 1. Cleansed with: Clean wound with Normal Saline 2. Anesthetic Topical Lidocaine 4% cream to wound bed prior to debridement 4. Dressing Applied: Other dressing (specify in notes) Notes coverlet, silvercel Electronic Signature(s) MICHAYLA, MCNEIL ([id number]) Signed: 11/19/2017 4:33:59 PM By: Evlyn Kanner MD,  FACS Entered By: Evlyn Kanner on 11/19/2017 16:33:58 Jessica Donovan ([id number]) -------------------------------------------------------------------------------- Multi-Disciplinary Care Plan Details Patient Name: Jessica Donovan Date of Service: 11/19/2017 3:30 PM Medical Record Number: [id number] Patient Account Number: [id number] Date of Birth/Sex: 1946-01-10 (71 y.o. Female) Treating RN: Ashok Cordia, Debi Primary Care Vicy Medico: Garlon Hatchet Other Clinician: Referring Archie Atilano: Garlon Hatchet Treating Rever Pichette/Extender: Rudene Re in Treatment: 19 Active Inactive ` Orientation to the Wound Care Program Nursing Diagnoses: Knowledge deficit related to the wound healing center program Goals: Patient/caregiver will verbalize understanding of the Wound Healing Center Program Date Initiated: 07/08/2017 Target Resolution Date: 10/08/2017 Goal Status: Active Interventions: Provide education on orientation to the wound center Notes: ` Peripheral Neuropathy Nursing Diagnoses: Knowledge deficit related to disease process and management of peripheral neurovascular dysfunction Potential alteration in peripheral tissue perfusion (select prior to confirmation of diagnosis) Goals: Patient/caregiver will verbalize understanding of disease process and disease management Date Initiated: 07/08/2017 Target Resolution Date: 10/08/2017 Goal Status: Active Interventions: Assess signs and symptoms of neuropathy upon admission and as needed Provide education on Management of Neuropathy and Related Ulcers Provide education on Management of Neuropathy upon discharge from the Wound Center Treatment Activities: Patient referred for customized footwear/offloading : 07/08/2017 Patient referred to diabetes educator : 07/08/2017 Notes: ` Wound/Skin Impairment Nursing Diagnoses: Impaired tissue integrity Jessica Donovan, Jessica Donovan ([id number]) Knowledge deficit related to ulceration/compromised skin  integrity Goals: Patient/caregiver will verbalize understanding of skin care regimen Date Initiated: 07/08/2017 Target Resolution Date: 10/08/2017 Goal Status: Active  Ulcer/skin breakdown will have a volume reduction of 30% by week 4 Date Initiated: 07/08/2017 Target Resolution Date: 10/08/2017 Goal Status: Active Ulcer/skin breakdown will have a volume reduction of 50% by week 8 Date Initiated: 07/08/2017 Target Resolution Date: 10/08/2017 Goal Status: Active Ulcer/skin breakdown will have a volume reduction of 80% by week 12 Date Initiated: 07/08/2017 Target Resolution Date: 10/08/2017 Goal Status: Active Ulcer/skin breakdown will heal within 14 weeks Date Initiated: 07/08/2017 Target Resolution Date: 10/08/2017 Goal Status: Active Interventions: Assess patient/caregiver ability to obtain necessary supplies Assess patient/caregiver ability to perform ulcer/skin care regimen upon admission and as needed Assess ulceration(s) every visit Treatment Activities: Referred to DME Lakara Weiland for dressing supplies : 07/08/2017 Skin care regimen initiated : 07/08/2017 Topical wound management initiated : 07/08/2017 Notes: Electronic Signature(s) Signed: 11/19/2017 4:39:39 PM By: Alejandro MullingPinkerton, Debra Entered By: Alejandro MullingPinkerton, Debra on 11/19/2017 15:20:10 Jessica Donovan, Jessica Donovan ([id number]) -------------------------------------------------------------------------------- Pain Assessment Details Patient Name: Jessica Donovan, Jessica Donovan Date of Service: 11/19/2017 3:30 PM Medical Record Number: [id number] Patient Account Number: [id number] Date of Birth/Sex: [birth year] (71 y.o. Female) Treating RN: Ashok CordiaPinkerton, Debi Primary Care Rebeckah Masih: Garlon HatchetMCCONVILLE, ROBERT Other Clinician: Referring Malania Gawthrop: Garlon HatchetMCCONVILLE, ROBERT Treating Shamarion Coots/Extender: Rudene ReBritto, Errol Weeks in Treatment: 5219 Active Problems Location of Pain Severity and Description of Pain Patient Has Paino Yes Site Locations Pain Location: Generalized Pain Rate the  pain. Current Pain Level: 8 Character of Pain Describe the Pain: Aching Pain Management and Medication Current Pain Management: Electronic Signature(s) Signed: 11/19/2017 4:39:39 PM By: Alejandro MullingPinkerton, Debra Entered By: Alejandro MullingPinkerton, Debra on 11/19/2017 15:10:41 Jessica Donovan, Jessica Donovan ([id number]) -------------------------------------------------------------------------------- Patient/Caregiver Education Details Patient Name: Jessica Donovan, Alahna Date of Service: 11/19/2017 3:30 PM Medical Record Number: [id number] Patient Account Number: [id number] Date of Birth/Gender: [birth year] (71 y.o. Female) Treating RN: Phillis HaggisPinkerton, Debi Primary Care Physician: Garlon HatchetMCCONVILLE, ROBERT Other Clinician: Referring Physician: Garlon HatchetMCCONVILLE, ROBERT Treating Physician/Extender: Rudene ReBritto, Errol Weeks in Treatment: 6619 Education Assessment Education Provided To: Patient Education Topics Provided Wound/Skin Impairment: Handouts: Other: change dressing as ordered Methods: Demonstration, Explain/Verbal Responses: State content correctly Electronic Signature(s) Signed: 11/19/2017 4:39:39 PM By: Alejandro MullingPinkerton, Debra Entered By: Alejandro MullingPinkerton, Debra on 11/19/2017 15:25:59 Jessica Donovan, [name] ([id number]) -------------------------------------------------------------------------------- Wound Assessment Details Patient Name: Jessica Donovan, Jessica Donovan Date of Service: 11/19/2017 3:30 PM Medical Record Number: [id number] Patient Account Number: [id number] Date of Birth/Sex: [birth year] (71 y.o. Female) Treating RN: Ashok CordiaPinkerton, Debi Primary Care Emonnie Cannady: Garlon HatchetMCCONVILLE, ROBERT Other Clinician: Referring Kalaysia Demonbreun: Garlon HatchetMCCONVILLE, ROBERT Treating Rache Klimaszewski/Extender: Rudene ReBritto, Errol Weeks in Treatment: 19 Wound Status Wound Number: 2 Primary Diabetic Wound/Ulcer of the Lower Extremity Etiology: Wound Location: Right Malleolus Wound Open Wounding Event: Gradually Appeared Status: Date Acquired: 02/17/2017 Comorbid Sleep Apnea, Coronary Artery Disease, Weeks Of Treatment:  19 History: Hypertension, Myocardial Infarction, Type II Clustered Wound: No Diabetes, History of pressure wounds, Osteoarthritis, Neuropathy, Confinement Anxiety Photos Photo Uploaded By: Alejandro MullingPinkerton, Debra on 11/19/2017 16:37:47 Wound Measurements Length: (cm) 0.3 Width: (cm) 0.3 Depth: (cm) 0.1 Area: (cm) 0.071 Volume: (cm) 0.007 % Reduction in Area: 94.2% % Reduction in Volume: 94.3% Epithelialization: None Tunneling: No Undermining: No Wound Description Classification: Grade 1 Wound Margin: Flat and Intact Exudate Amount: Large Exudate Type: Serous Exudate Color: amber Foul Odor After Cleansing: No Slough/Fibrino Yes Wound Bed Granulation Amount: Large (67-100%) Granulation Quality: Red Necrotic Amount: Small (1-33%) Necrotic Quality: Adherent Slough Periwound Skin Texture Texture Color No Abnormalities Noted: No No Abnormalities Noted: No Jessica Donovan, Jessica Donovan ([id number]) Excoriation: Yes Erythema: Yes Erythema Location: Circumferential Moisture Rubor: Yes No Abnormalities Noted: No Temperature / Pain Temperature: No Abnormality Tenderness on Palpation: Yes Wound Preparation Ulcer Cleansing: Rinsed/Irrigated with Saline Topical  Anesthetic Applied: Other: lidocaine 4%, Treatment Notes Wound #2 (Right Malleolus) 1. Cleansed with: Clean wound with Normal Saline 2. Anesthetic Topical Lidocaine 4% cream to wound bed prior to debridement 3. Peri-wound Care: Antifungal cream 4. Dressing Applied: Prisma Ag 5. Secondary Dressing Applied Dry Gauze [place] Electronic Signature(s) Signed: 11/19/2017 4:39:39 PM By: Alejandro MullingPinkerton, Debra Entered By: Alejandro MullingPinkerton, Debra on 11/19/2017 15:18:58 Scaletta, [name] ([id number]) -------------------------------------------------------------------------------- Wound Assessment Details Patient Name: Jessica Donovan, Vallerie Date of Service: 11/19/2017 3:30 PM Medical Record Number: [id number] Patient Account Number: [id number] Date of  Birth/Sex: 03-Jun-1946 (71 y.o. Female) Treating RN: Ashok CordiaPinkerton, Debi Primary Care Antwonette Feliz: Garlon HatchetMCCONVILLE, ROBERT Other Clinician: Referring Jakara Blatter: Garlon HatchetMCCONVILLE, ROBERT Treating Adonica Fukushima/Extender: Rudene ReBritto, Errol Weeks in Treatment: 19 Wound Status Wound Number: 4 Primary Trauma, Other Etiology: Wound Location: Left Toe Fourth Wound Open Wounding Event: Trauma Status: Date Acquired: 10/12/2017 Comorbid Sleep Apnea, Coronary Artery Disease, Weeks Of Treatment: 5 History: Hypertension, Myocardial Infarction, Type II Clustered Wound: No Diabetes, History of pressure wounds, Osteoarthritis, Neuropathy, Confinement Anxiety Photos Photo Uploaded By: Alejandro MullingPinkerton, Debra on 11/19/2017 16:37:47 Wound Measurements Length: (cm) 0.5 Width: (cm) 0.5 Depth: (cm) 0.2 Area: (cm) 0.196 Volume: (cm) 0.039 % Reduction in Area: 71.6% % Reduction in Volume: 43.5% Epithelialization: None Tunneling: No Undermining: No Wound Description Classification: Partial Thickness Wound Margin: Distinct, outline attached Exudate Amount: Large Exudate Type: Serosanguineous Exudate Color: red, brown Foul Odor After Cleansing: No Slough/Fibrino Yes Wound Bed Granulation Amount: Medium (34-66%) Exposed Structure Granulation Quality: Red Fascia Exposed: No Necrotic Amount: Medium (34-66%) Fat Layer (Subcutaneous Tissue) Exposed: No Necrotic Quality: Eschar, Adherent Slough Tendon Exposed: No Muscle Exposed: No Joint Exposed: No Bone Exposed: No Jessica Donovan, Maicie ([id number]) Periwound Skin Texture Texture Color No Abnormalities Noted: No No Abnormalities Noted: No Erythema: Yes Moisture Erythema Location: Circumferential No Abnormalities Noted: No Maceration: Yes Temperature / Pain Temperature: No Abnormality Tenderness on Palpation: Yes Wound Preparation Ulcer Cleansing: Rinsed/Irrigated with Saline Topical Anesthetic Applied: Other: lidocaine 4%, Treatment Notes Wound #4 (Left Toe Fourth) 1.  Cleansed with: Clean wound with Normal Saline 2. Anesthetic Topical Lidocaine 4% cream to wound bed prior to debridement 4. Dressing Applied: Other dressing (specify in notes) Notes coverlet, silvercel Electronic Signature(s) Signed: 11/19/2017 4:39:39 PM By: Alejandro MullingPinkerton, Debra Entered By: Alejandro MullingPinkerton, Debra on 11/19/2017 15:19:30 Grinnell, [name] ([id number]) -------------------------------------------------------------------------------- Vitals Details Patient Name: Jessica Donovan, Saidy Date of Service: 11/19/2017 3:30 PM Medical Record Number: [id number] Patient Account Number: [id number] Date of Birth/Sex: 03-Jun-1946 (71 y.o. Female) Treating RN: Ashok CordiaPinkerton, Debi Primary Care Ilo Beamon: Garlon HatchetMCCONVILLE, ROBERT Other Clinician: Referring Jamecia Lerman: Garlon HatchetMCCONVILLE, ROBERT Treating Elroy Schembri/Extender: Rudene ReBritto, Errol Weeks in Treatment: 19 Vital Signs Time Taken: 15:10 Temperature (F): 98.4 Height (in): 66 Pulse (bpm): 77 Weight (lbs): 191 Respiratory Rate (breaths/min): 18 Body Mass Index (BMI): 30.8 Blood Pressure (mmHg): 139/57 Reference Range: 80 - 120 mg / dl Electronic Signature(s) Signed: 11/19/2017 4:39:39 PM By: Alejandro MullingPinkerton, Debra Entered By: Alejandro MullingPinkerton, Debra on 11/19/2017 15:12:57

## 2017-11-26 ENCOUNTER — Encounter: Payer: PPO | Attending: Surgery | Admitting: Surgery

## 2017-11-26 DIAGNOSIS — M1990 Unspecified osteoarthritis, unspecified site: Secondary | ICD-10-CM | POA: Diagnosis not present

## 2017-11-26 DIAGNOSIS — L97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 left ankle with fat layer exposed: Secondary | ICD-10-CM | POA: Diagnosis not present

## 2017-11-26 DIAGNOSIS — L03115 Cellulitis of right lower limb: Secondary | ICD-10-CM | POA: Insufficient documentation

## 2017-11-26 DIAGNOSIS — L97522 Non-pressure chronic ulcer of other part of left foot with fat layer exposed: Secondary | ICD-10-CM | POA: Diagnosis not present

## 2017-11-26 DIAGNOSIS — F419 Anxiety disorder, unspecified: Secondary | ICD-10-CM | POA: Insufficient documentation

## 2017-11-26 DIAGNOSIS — E1140 Type 2 diabetes mellitus with diabetic neuropathy, unspecified: Secondary | ICD-10-CM | POA: Insufficient documentation

## 2017-11-26 DIAGNOSIS — I10 Essential (primary) hypertension: Secondary | ICD-10-CM | POA: Diagnosis not present

## 2017-11-26 DIAGNOSIS — Z794 Long term (current) use of insulin: Secondary | ICD-10-CM | POA: Insufficient documentation

## 2017-11-26 DIAGNOSIS — G8929 Other chronic pain: Secondary | ICD-10-CM | POA: Diagnosis not present

## 2017-11-26 DIAGNOSIS — L97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 layer exposed: Secondary | ICD-10-CM | POA: Insufficient documentation

## 2017-11-26 DIAGNOSIS — I2510 Atherosclerotic heart disease of native coronary artery without angina pectoris: Secondary | ICD-10-CM | POA: Diagnosis not present

## 2017-11-26 DIAGNOSIS — I252 Old myocardial infarction: Secondary | ICD-10-CM | POA: Insufficient documentation

## 2017-11-26 DIAGNOSIS — E11621 Type 2 diabetes mellitus with foot ulcer: Secondary | ICD-10-CM | POA: Insufficient documentation

## 2017-11-26 DIAGNOSIS — G4730 Sleep apnea, unspecified: Secondary | ICD-10-CM | POA: Insufficient documentation

## 2017-11-26 DIAGNOSIS — S91205A Unspecified open wound of left lesser toe(s) with damage to nail, initial encounter: Secondary | ICD-10-CM | POA: Diagnosis not present

## 2017-11-26 DIAGNOSIS — E11622 Type 2 diabetes mellitus with other skin ulcer: Secondary | ICD-10-CM | POA: Diagnosis not present

## 2017-11-26 DIAGNOSIS — L97519 Non-pressure chronic ulcer of other part of right foot with unspecified severity: Secondary | ICD-10-CM | POA: Diagnosis not present

## 2017-11-30 NOTE — Progress Notes (Signed)
Jessica, Donovan ([id number]) Visit Report for 11/26/2017 Arrival Information Details Patient Name: Jessica Donovan, Jessica Donovan Date of Service: 11/26/2017 3:15 PM Medical Record Number: [id number] Patient Account Number: [id number] Date of Birth/Sex: 06/22/1946 (71 y.o. Female) Treating RN: Jessica Donovan Primary Care Jessica Donovan: Jessica Donovan Other Clinician: Referring Jessica Donovan: Jessica Donovan Treating Jessica Donovan/Extender: Jessica Donovan in Treatment: 20 Visit Information History Since Last Visit All ordered tests and consults were completed: No Patient Arrived: Ambulatory Added or deleted any medications: No Arrival Time: 15:08 Any new allergies or adverse reactions: No Accompanied By: self Had a fall or experienced change in No Transfer Assistance: None activities of daily living that may affect Patient Identification Verified: Yes risk of falls: Secondary Verification Process Yes Signs or symptoms of abuse/neglect since last visito No Completed: Hospitalized since last visit: No Patient Requires Transmission-Based No Pain Present Now: Yes Precautions: Patient Has Alerts: Yes Patient Alerts: Patient on Blood Thinner ASA Electronic Signature(s) Signed: 11/26/2017 4:47:10 PM By: Jessica Donovan Entered By: Jessica Donovan on 11/26/2017 15:10:10 Jessica Donovan, Jessica Donovan ([id number]) -------------------------------------------------------------------------------- Clinic Level of Care Assessment Details Patient Name: Jessica Donovan, Jessica Donovan Date of Service: 11/26/2017 3:15 PM Medical Record Number: [id number] Patient Account Number: [id number] Date of Birth/Sex: 13-Sep-1946 (71 y.o. Female) Treating RN: Jessica Donovan Primary Care Sharea Guinther: Jessica Donovan Other Clinician: Referring Vang Kraeger: Jessica Donovan Treating Adelia Baptista/Extender: Jessica Donovan in Treatment: 20 Clinic Level of Care Assessment Items TOOL 4 Quantity Score []  - Use when only an EandM is performed on FOLLOW-UP  visit 0 ASSESSMENTS - Nursing Assessment / Reassessment X - Reassessment of Co-morbidities (includes updates in patient status) 1 10 []  - 0 Reassessment of Adherence to Treatment Plan ASSESSMENTS - Wound and Skin Assessment / Reassessment []  - Simple Wound Assessment / Reassessment - one wound 0 X- 2 5 Complex Wound Assessment / Reassessment - multiple wounds []  - 0 Dermatologic / Skin Assessment (not related to wound area) ASSESSMENTS - Focused Assessment []  - Circumferential Edema Measurements - multi extremities 0 []  - 0 Nutritional Assessment / Counseling / Intervention []  - 0 Lower Extremity Assessment (monofilament, tuning fork, pulses) []  - 0 Peripheral Arterial Disease Assessment (using hand held doppler) ASSESSMENTS - Ostomy and/or Continence Assessment and Care []  - Incontinence Assessment and Management 0 []  - 0 Ostomy Care Assessment and Management (repouching, etc.) PROCESS - Coordination of Care []  - Simple Patient / Family Education for ongoing care 0 X- 1 20 Complex (extensive) Patient / Family Education for ongoing care []  - 0 Staff obtains Chiropractor, Records, Test Results / Process Orders []  - 0 Staff telephones HHA, Nursing Homes / Clarify orders / etc []  - 0 Routine Transfer to another Facility (non-emergent condition) []  - 0 Routine Hospital Admission (non-emergent condition) []  - 0 New Admissions / Manufacturing engineer / Ordering NPWT, Apligraf, etc. []  - 0 Emergency Hospital Admission (emergent condition) []  - 0 Simple Discharge Coordination Donovan, Jessica ([id number]) []  - 0 Complex (extensive) Discharge Coordination PROCESS - Special Needs []  - Pediatric / Minor Patient Management 0 []  - 0 Isolation Patient Management []  - 0 Hearing / Language / Visual special needs []  - 0 Assessment of Community assistance (transportation, D/C planning, etc.) []  - 0 Additional assistance / Altered mentation []  - 0 Support Surface(s) Assessment (bed,  cushion, seat, etc.) INTERVENTIONS - Wound Cleansing / Measurement []  - Simple Wound Cleansing - one wound 0 X- 2 5 Complex Wound Cleansing - multiple wounds X- 1 5 Wound Imaging (photographs - any number of wounds) []  - 0 Wound Tracing (instead  of photographs) []  - 0 Simple Wound Measurement - one wound X- 2 5 Complex Wound Measurement - multiple wounds INTERVENTIONS - Wound Dressings X - Small Wound Dressing one or multiple wounds 2 10 []  - 0 Medium Wound Dressing one or multiple wounds []  - 0 Large Wound Dressing one or multiple wounds []  - 0 Application of Medications - topical []  - 0 Application of Medications - injection INTERVENTIONS - Miscellaneous []  - External ear exam 0 []  - 0 Specimen Collection (cultures, biopsies, blood, body fluids, etc.) []  - 0 Specimen(s) / Culture(s) sent or taken to Lab for analysis []  - 0 Patient Transfer (multiple staff / Nurse, adultHoyer Lift / Similar devices) []  - 0 Simple Staple / Suture removal (25 or less) []  - 0 Complex Staple / Suture removal (26 or more) []  - 0 Hypo / Hyperglycemic Management (close monitor of Blood Glucose) []  - 0 Ankle / Brachial Index (ABI) - do not check if billed separately X- 1 5 Vital Signs Donovan, Jessica ([id number]) Has the patient been seen at the hospital within the last three years: Yes Total Score: 90 Level Of Care: New/Established - Level 3 Electronic Signature(s) Signed: 11/26/2017 4:47:10 PM By: Jessica CriglerFlinchum, Jessica Entered By: Jessica CriglerFlinchum, Jessica on 11/26/2017 15:51:35 Donovan, [name] ([id number]) -------------------------------------------------------------------------------- Encounter Discharge Information Details Patient Name: Jessica Donovan, Jessica Donovan Date of Service: 11/26/2017 3:15 PM Medical Record Number: [id number] Patient Account Number: [id number] Date of Birth/Sex: [birth year] (71 y.o. Female) Treating RN: Huel CoventryWoody, Kim Primary Care Tremon Sainvil: Jessica HatchetMCCONVILLE, Jessica Other Clinician: Referring Jessica Donovan:  Jessica HatchetMCCONVILLE, Jessica Treating Jessica Vonruden/Extender: Jessica ReBritto, Errol Weeks in Treatment: 20 Encounter Discharge Information Items Discharge Pain Level: 0 Discharge Condition: Stable Ambulatory Status: Walker Discharge Destination: Home Transportation: Private Auto Accompanied By: self Schedule Follow-up Appointment: No Medication Reconciliation completed and No provided to Patient/Care Dvid Pendry: Provided on Clinical Summary of Care: 11/26/2017 Form Type Recipient Paper Patient BG Electronic Signature(s) Signed: 11/26/2017 4:47:10 PM By: Jessica CriglerFlinchum, Jessica Entered By: Jessica CriglerFlinchum, Jessica on 11/26/2017 15:52:53 Chatham, [name] ([id number]) -------------------------------------------------------------------------------- Lower Extremity Assessment Details Patient Name: Jessica Donovan, Jessica Donovan Date of Service: 11/26/2017 3:15 PM Medical Record Number: [id number] Patient Account Number: [id number] Date of Birth/Sex: [birth year] (71 y.o. Female) Treating RN: Jessica CriglerFlinchum, Jessica Primary Care Dolores Mcgovern: Jessica HatchetMCCONVILLE, Jessica Other Clinician: Referring Jaquanna Ballentine: Jessica HatchetMCCONVILLE, Jessica Treating Willa Brocks/Extender: Jessica ReBritto, Errol Weeks in Treatment: 20 Edema Assessment Assessed: [Left: No] [Right: No] Edema: [Left: No] [Right: No] Vascular Assessment Claudication: Claudication Assessment [Left:None] [Right:None] Pulses: Dorsalis Pedis Palpable: [Left:Yes] [Right:Yes] Posterior Tibial Extremity colors, hair growth, and conditions: Extremity Color: [Left:Normal] [Right:Normal] Hair Growth on Extremity: [Left:Yes] [Right:Yes] Temperature of Extremity: [Left:Warm] [Right:Cool] Capillary Refill: [Left:< 3 seconds] [Right:< 3 seconds] Toe Nail Assessment Left: Right: Thick: Yes Yes Discolored: Yes Yes Deformed: Yes Yes Improper Length and Hygiene: Yes Yes Electronic Signature(s) Signed: 11/26/2017 4:47:10 PM By: Jessica CriglerFlinchum, Jessica Entered By: Jessica CriglerFlinchum, Jessica on 11/26/2017 15:29:47 Korman, [name]  ([id number]) -------------------------------------------------------------------------------- Multi Wound Chart Details Patient Name: Jessica Donovan, Jessica Donovan Date of Service: 11/26/2017 3:15 PM Medical Record Number: [id number] Patient Account Number: [id number] Date of Birth/Sex: [birth year] (71 y.o. Female) Treating RN: Huel CoventryWoody, Kim Primary Care Mieke Brinley: Jessica HatchetMCCONVILLE, Jessica Other Clinician: Referring Toia Micale: Jessica HatchetMCCONVILLE, Jessica Treating Rich Paprocki/Extender: Jessica ReBritto, Errol Weeks in Treatment: 20 Vital Signs Height(in): 66 Pulse(bpm): 101 Weight(lbs): 191 Blood Pressure(mmHg): 139/70 Body Mass Index(BMI): 31 Temperature(F): 98.4 Respiratory Rate 18 (breaths/min): Photos: [2:No Photos] [4:No Photos] [6:No Photos] Wound Location: [2:Right Malleolus] [4:Left Toe Fourth] [6:Right Toe Second] Wounding Event: [2:Gradually Appeared] [4:Trauma] [6:Gradually Appeared] Primary Etiology: [2:Diabetic Wound/Ulcer of the Lower Extremity] [4:Trauma, Other] [6:Pressure Ulcer] Comorbid History: [  2:Sleep Apnea, Coronary Artery Disease, Hypertension, Myocardial Infarction, Type II Diabetes, History of pressure wounds, Osteoarthritis, Neuropathy, Confinement Anxiety] [4:Sleep Apnea, Coronary Artery Disease, Hypertension,  Myocardial Infarction, Type II Diabetes, History of pressure wounds, Osteoarthritis, Neuropathy, Confinement Anxiety] [6:N/A] Date Acquired: [2:02/17/2017] [4:10/12/2017] [6:11/19/2017] Weeks of Treatment: [2:20] [4:6] [6:0] Wound Status: [2:Open] [4:Open] [6:Open] Measurements L x W x D [2:0.3x0.4x0.2] [4:0.5x0.5x0.2] [6:0.1x0.1x0.1] (cm) Area (cm) : [2:0.094] [4:0.196] [6:0.008] Volume (cm) : [2:0.019] [4:0.039] [6:0.001] % Reduction in Area: [2:92.30%] [4:71.60%] [6:N/A] % Reduction in Volume: [2:84.60%] [4:43.50%] [6:N/A] Classification: [2:Grade 1] [4:Partial Thickness] [6:N/A] Exudate Amount: [2:Large] [4:Large] [6:N/A] Exudate Type: [2:Serous] [4:Serosanguineous] [6:N/A] Exudate  Color: [2:amber] [4:red, brown] [6:N/A] Wound Margin: [2:Flat and Intact] [4:Distinct, outline attached] [6:N/A] Granulation Amount: [2:Medium (34-66%)] [4:Medium (34-66%)] [6:N/A] Granulation Quality: [2:Red] [4:Red] [6:N/A] Necrotic Amount: [2:Medium (34-66%)] [4:Medium (34-66%)] [6:N/A] Necrotic Tissue: [2:Adherent Slough] [4:Eschar, Adherent Slough] [6:N/A] Epithelialization: [2:None] [4:None] [6:N/A] Periwound Skin Texture: [2:Excoriation: Yes] [4:Callus: Yes Excoriation: No Induration: No Crepitus: No Rash: No Scarring: No] [6:No Abnormalities Noted] Periwound Skin Moisture: No Abnormalities Noted Maceration: No No Abnormalities Noted Dry/Scaly: No Periwound Skin Color: Erythema: Yes Atrophie Blanche: No No Abnormalities Noted Rubor: Yes Cyanosis: No Ecchymosis: No Erythema: No Hemosiderin Staining: No Mottled: No Pallor: No Rubor: No Erythema Location: Circumferential N/A N/A Temperature: No Abnormality No Abnormality N/A Tenderness on Palpation: Yes Yes No Wound Preparation: Ulcer Cleansing: Ulcer Cleansing: N/A Rinsed/Irrigated with Saline Rinsed/Irrigated with Saline Topical Anesthetic Applied: Topical Anesthetic Applied: Other: lidocaine 4% Other: lidocaine 4% Treatment Notes Electronic Signature(s) Signed: 11/26/2017 3:49:44 PM By: Evlyn Kanner MD, FACS Entered By: Evlyn Kanner on 11/26/2017 15:49:44 Jessica Donovan ([id number]) -------------------------------------------------------------------------------- Multi-Disciplinary Care Plan Details Patient Name: Jessica Donovan Date of Service: 11/26/2017 3:15 PM Medical Record Number: [id number] Patient Account Number: [id number] Date of Birth/Sex: November 23, 1946 (71 y.o. Female) Treating RN: Jessica Donovan Primary Care Joseeduardo Brix: Jessica Donovan Other Clinician: Referring Amrita Radu: Jessica Donovan Treating Jessy Cybulski/Extender: Jessica Donovan in Treatment: 20 Active Inactive ` Orientation to the Wound  Care Program Nursing Diagnoses: Knowledge deficit related to the wound healing center program Goals: Patient/caregiver will verbalize understanding of the Wound Healing Center Program Date Initiated: 07/08/2017 Target Resolution Date: 10/08/2017 Goal Status: Active Interventions: Provide education on orientation to the wound center Notes: ` Peripheral Neuropathy Nursing Diagnoses: Knowledge deficit related to disease process and management of peripheral neurovascular dysfunction Potential alteration in peripheral tissue perfusion (select prior to confirmation of diagnosis) Goals: Patient/caregiver will verbalize understanding of disease process and disease management Date Initiated: 07/08/2017 Target Resolution Date: 10/08/2017 Goal Status: Active Interventions: Assess signs and symptoms of neuropathy upon admission and as needed Provide education on Management of Neuropathy and Related Ulcers Provide education on Management of Neuropathy upon discharge from the Wound Center Treatment Activities: Patient referred for customized footwear/offloading : 07/08/2017 Patient referred to diabetes educator : 07/08/2017 Notes: ` Wound/Skin Impairment Nursing Diagnoses: Impaired tissue integrity Santucci, Sharde ([id number]) Knowledge deficit related to ulceration/compromised skin integrity Goals: Patient/caregiver will verbalize understanding of skin care regimen Date Initiated: 07/08/2017 Target Resolution Date: 10/08/2017 Goal Status: Active Ulcer/skin breakdown will have a volume reduction of 30% by week 4 Date Initiated: 07/08/2017 Target Resolution Date: 10/08/2017 Goal Status: Active Ulcer/skin breakdown will have a volume reduction of 50% by week 8 Date Initiated: 07/08/2017 Target Resolution Date: 10/08/2017 Goal Status: Active Ulcer/skin breakdown will have a volume reduction of 80% by week 12 Date Initiated: 07/08/2017 Target Resolution Date: 10/08/2017 Goal Status:  Active Ulcer/skin breakdown will heal within 14 weeks Date Initiated: 07/08/2017  Target Resolution Date: 10/08/2017 Goal Status: Active Interventions: Assess patient/caregiver ability to obtain necessary supplies Assess patient/caregiver ability to perform ulcer/skin care regimen upon admission and as needed Assess ulceration(s) every visit Treatment Activities: Referred to DME Shavana Calder for dressing supplies : 07/08/2017 Skin care regimen initiated : 07/08/2017 Topical wound management initiated : 07/08/2017 Notes: Electronic Signature(s) Signed: 11/26/2017 4:47:10 PM By: Jessica Donovan Entered By: Jessica Donovan on 11/26/2017 15:23:36 Pavon, Jessica Donovan ([id number]) -------------------------------------------------------------------------------- Pain Assessment Details Patient Name: Jessica Donovan Date of Service: 11/26/2017 3:15 PM Medical Record Number: [id number] Patient Account Number: [id number] Date of Birth/Sex: [birth year] (71 y.o. Female) Treating RN: Jessica Donovan Primary Care Zahrah Sutherlin: Jessica Donovan Other Clinician: Referring Marae Cottrell: Jessica Donovan Treating Hailee Hollick/Extender: Jessica Donovan in Treatment: 20 Active Problems Location of Pain Severity and Description of Pain Patient Has Paino Yes Site Locations With Dressing Change: Yes Duration of the Pain. Constant / Intermittento Intermittent Rate the pain. Current Pain Level: 5 Character of Pain Describe the Pain: Sharp, Tender Pain Management and Medication Current Pain Management: Electronic Signature(s) Signed: 11/26/2017 4:47:10 PM By: Jessica Donovan Entered By: Jessica Donovan on 11/26/2017 15:10:53 Matsumura, Jessica Donovan ([id number]) -------------------------------------------------------------------------------- Patient/Caregiver Education Details Patient Name: Jessica Donovan Date of Service: 11/26/2017 3:15 PM Medical Record Number: [id number] Patient Account Number: [id number] Date of  Birth/Gender: [birth year] (71 y.o. Female) Treating RN: Jessica Donovan Primary Care Physician: Jessica Donovan Other Clinician: Referring Physician: Garlon Donovan Treating Physician/Extender: Jessica Donovan in Treatment: 20 Education Assessment Education Provided To: Patient Education Topics Provided Wound/Skin Impairment: Handouts: Caring for Your Ulcer Methods: Explain/Verbal Responses: State content correctly Electronic Signature(s) Signed: 11/26/2017 4:47:10 PM By: Jessica Donovan Entered By: Jessica Donovan on 11/26/2017 15:53:04 Coleson, Jessica Donovan ([id number]) -------------------------------------------------------------------------------- Wound Assessment Details Patient Name: Jessica Donovan Date of Service: 11/26/2017 3:15 PM Medical Record Number: [id number] Patient Account Number: [id number] Date of Birth/Sex: 09-10-1946 (71 y.o. Female) Treating RN: Jessica Donovan Primary Care Urian Martenson: Jessica Donovan Other Clinician: Referring Lekia Nier: Jessica Donovan Treating Junita Kubota/Extender: Jessica Donovan in Treatment: 20 Wound Status Wound Number: 2 Primary Diabetic Wound/Ulcer of the Lower Extremity Etiology: Wound Location: Right Malleolus Wound Open Wounding Event: Gradually Appeared Status: Date Acquired: 02/17/2017 Comorbid Sleep Apnea, Coronary Artery Disease, Weeks Of Treatment: 20 History: Hypertension, Myocardial Infarction, Type II Clustered Wound: No Diabetes, History of pressure wounds, Osteoarthritis, Neuropathy, Confinement Anxiety Photos Photo Uploaded By: Jessica Donovan on 11/26/2017 16:52:16 Wound Measurements Length: (cm) 0.3 Width: (cm) 0.4 Depth: (cm) 0.2 Area: (cm) 0.094 Volume: (cm) 0.019 % Reduction in Area: 92.3% % Reduction in Volume: 84.6% Epithelialization: None Tunneling: No Undermining: No Wound Description Classification: Grade 1 Wound Margin: Flat and Intact Exudate Amount: Large Exudate Type:  Serous Exudate Color: amber Foul Odor After Cleansing: No Slough/Fibrino Yes Wound Bed Granulation Amount: Medium (34-66%) Granulation Quality: Red Necrotic Amount: Medium (34-66%) Necrotic Quality: Adherent Slough Periwound Skin Texture Texture Color No Abnormalities Noted: No No Abnormalities Noted: No Lipson, Brennyn ([id number]) Excoriation: Yes Erythema: Yes Erythema Location: Circumferential Moisture Rubor: Yes No Abnormalities Noted: No Temperature / Pain Temperature: No Abnormality Tenderness on Palpation: Yes Wound Preparation Ulcer Cleansing: Rinsed/Irrigated with Saline Topical Anesthetic Applied: Other: lidocaine 4%, Treatment Notes Wound #2 (Right Malleolus) 1. Cleansed with: Clean wound with Normal Saline 2. Anesthetic Topical Lidocaine 4% cream to wound bed prior to debridement 4. Dressing Applied: Other dressing (specify in notes) 5. Secondary Dressing Applied Bordered Foam Dressing Notes silvercel Electronic Signature(s) Signed: 11/26/2017 4:47:10 PM By: Jessica Donovan Entered By: Jessica Donovan on 11/26/2017 15:24:31 Drennon, Jessica Donovan ([id number]) -------------------------------------------------------------------------------- Wound  Assessment Details Patient Name: Jessica Donovan, Jessica Donovan Date of Service: 11/26/2017 3:15 PM Medical Record Number: [id number] Patient Account Number: [id number] Date of Birth/Sex: [birth year](71 y.o. Female) Treating RN: Jessica CriglerFlinchum, Jessica Primary Care Mileigh Tilley: Jessica HatchetMCCONVILLE, Jessica Other Clinician: Referring Oscar Hank: Jessica HatchetMCCONVILLE, Jessica Treating Aiko Belko/Extender: Jessica ReBritto, Errol Weeks in Treatment: 20 Wound Status Wound Number: 4 Primary Trauma, Other Etiology: Wound Location: Left Toe Fourth Wound Open Wounding Event: Trauma Status: Date Acquired: 10/12/2017 Comorbid Sleep Apnea, Coronary Artery Disease, Weeks Of Treatment: 6 History: Hypertension, Myocardial Infarction, Type II Clustered Wound: No Diabetes, History of  pressure wounds, Osteoarthritis, Neuropathy, Confinement Anxiety Photos Photo Uploaded By: Jessica CriglerFlinchum, Jessica on 11/26/2017 16:53:11 Wound Measurements Length: (cm) 0.5 Width: (cm) 0.5 Depth: (cm) 0.2 Area: (cm) 0.196 Volume: (cm) 0.039 % Reduction in Area: 71.6% % Reduction in Volume: 43.5% Epithelialization: None Tunneling: No Undermining: No Wound Description Classification: Partial Thickness Wound Margin: Distinct, outline attached Exudate Amount: Large Exudate Type: Serosanguineous Exudate Color: red, brown Foul Odor After Cleansing: No Slough/Fibrino Yes Wound Bed Granulation Amount: Medium (34-66%) Exposed Structure Granulation Quality: Red Fascia Exposed: No Necrotic Amount: Medium (34-66%) Fat Layer (Subcutaneous Tissue) Exposed: No Necrotic Quality: Eschar, Adherent Slough Tendon Exposed: No Muscle Exposed: No Joint Exposed: No Bone Exposed: No Hayward, Karson ([id number]) Periwound Skin Texture Texture Color No Abnormalities Noted: No No Abnormalities Noted: No Callus: Yes Atrophie Blanche: No Crepitus: No Cyanosis: No Excoriation: No Ecchymosis: No Induration: No Erythema: No Rash: No Hemosiderin Staining: No Scarring: No Mottled: No Pallor: No Moisture Rubor: No No Abnormalities Noted: No Dry / Scaly: No Temperature / Pain Maceration: No Temperature: No Abnormality Tenderness on Palpation: Yes Wound Preparation Ulcer Cleansing: Rinsed/Irrigated with Saline Topical Anesthetic Applied: Other: lidocaine 4%, Treatment Notes Wound #4 (Left Toe Fourth) 1. Cleansed with: Clean wound with Normal Saline 2. Anesthetic Topical Lidocaine 4% cream to wound bed prior to debridement 4. Dressing Applied: Other dressing (specify in notes) 5. Secondary Dressing Applied Bordered Foam Dressing Notes silvercel Electronic Signature(s) Signed: 11/26/2017 4:47:10 PM By: Jessica CriglerFlinchum, Jessica Entered By: Jessica CriglerFlinchum, Jessica on 11/26/2017 15:25:41 Summerson,  [name] ([id number]) -------------------------------------------------------------------------------- Wound Assessment Details Patient Name: Jessica Donovan, Jessica Donovan Date of Service: 11/26/2017 3:15 PM Medical Record Number: [id number] Patient Account Number: [id number] Date of Birth/Sex: [birth year] (71 y.o. Female) Treating RN: Jessica CriglerFlinchum, Jessica Primary Care Deundra Furber: Jessica HatchetMCCONVILLE, Jessica Other Clinician: Referring Al Bracewell: Jessica HatchetMCCONVILLE, Jessica Treating Shaelyn Decarli/Extender: Jessica ReBritto, Errol Weeks in Treatment: 20 Wound Status Wound Number: 6 Primary Etiology: Pressure Ulcer Wound Location: Right Toe Second Wound Status: Open Wounding Event: Gradually Appeared Date Acquired: 11/19/2017 Weeks Of Treatment: 0 Clustered Wound: No Photos Photo Uploaded By: Jessica CriglerFlinchum, Jessica on 11/26/2017 16:53:11 Wound Measurements Length: (cm) Width: (cm) Depth: (cm) Area: (cm) Volume: (cm) 0.1 % Reduction in Area: 0.1 % Reduction in Volume: 0.1 0.008 0.001 Periwound Skin Texture Texture Color No Abnormalities Noted: No No Abnormalities Noted: No Moisture No Abnormalities Noted: No Electronic Signature(s) Signed: 11/26/2017 4:47:10 PM By: Jessica CriglerFlinchum, Jessica Entered By: Jessica CriglerFlinchum, Jessica on 11/26/2017 15:21:56 Coltrin, [name] ([id number]) -------------------------------------------------------------------------------- Vitals Details Patient Name: Jessica Donovan, Jessica Donovan Date of Service: 11/26/2017 3:15 PM Medical Record Number: [id number] Patient Account Number: [id number] Date of Birth/Sex: [birth year] (71 y.o. Female) Treating RN: Jessica CriglerFlinchum, Jessica Primary Care Kabe Mckoy: Jessica HatchetMCCONVILLE, Jessica Other Clinician: Referring Anglia Blakley: Jessica HatchetMCCONVILLE, Jessica Treating Walburga Hudman/Extender: Jessica ReBritto, Errol Weeks in Treatment: 20 Vital Signs Time Taken: 03:11 Temperature (F): 98.4 Height (in): 66 Pulse (bpm): 101 Weight (lbs): 191 Respiratory Rate (breaths/min): 18 Body Mass Index (BMI): 30.8 Blood Pressure (mmHg):  139/70 Reference Range: 80 - 120 mg / dl  Electronic Signature(s) Signed: 11/26/2017 4:47:10 PM By: Jessica Donovan Entered By: Jessica Donovan on 11/26/2017 15:11:32

## 2017-12-01 DIAGNOSIS — M545 Low back pain: Secondary | ICD-10-CM | POA: Diagnosis not present

## 2017-12-01 DIAGNOSIS — M5416 Radiculopathy, lumbar region: Secondary | ICD-10-CM | POA: Diagnosis not present

## 2017-12-01 NOTE — Progress Notes (Signed)
Jessica Donovan ([id number]) Visit Report for 11/26/2017 Chief Complaint Document Details Patient Name: Jessica Donovan, Jessica Donovan Date of Service: 11/26/2017 3:15 PM Medical Record Number: [id number] Patient Account Number: [id number] Date of Birth/Sex: Jan 13, 1946 (71 y.o. Female) Treating RN: Jessica Donovan Primary Care Provider: Garlon Donovan Other Clinician: Referring Provider: Garlon Donovan Treating Provider/Extender: Jessica Donovan in Treatment: 20 Information Obtained from: Patient Chief Complaint Patients presents for treatment of an open diabetic ulcer to the right ankle the left ankle and the right third toe Electronic Signature(s) Signed: 11/26/2017 3:49:51 PM By: Jessica Kanner MD, FACS Entered By: Jessica Donovan on 11/26/2017 15:49:51 Jessica Donovan ([id number]) -------------------------------------------------------------------------------- HPI Details Patient Name: Jessica Donovan Date of Service: 11/26/2017 3:15 PM Medical Record Number: [id number] Patient Account Number: [id number] Date of Birth/Sex: [birth year] (71 y.o. Female) Treating RN: Jessica Donovan Primary Care Provider: Garlon Donovan Other Clinician: Referring Provider: Garlon Donovan Treating Provider/Extender: Jessica Donovan in Treatment: 20 History of Present Illness HPI Description: 71 year old diabetic patient who has chronic back pain, collagen vascular disease, coronary artery disease, hypertension and history of osteomyelitis of the toe in February 2017. 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 She also has had joint replacement surgery in the past. She was recently reviewed by Primary care management team and had a problem with the toe on her right foot and her left ankle had minimal drainage, and the right ankle was draining worse. She was seen in the ER on 06/22/2017, and on the x-ray, there was no evidence of any acute bony abnormality. 07/22/2017 -- she says she had  a mild abrasion to her right second toe which was inadvertent but there is no open ulceration. 07/30/17 on evaluation today patient appears to be doing better in regard to her abrasion to the right second toe. It appears there's little bit of a pressure injury although there is no opening at this point. I think this is pushing up on her shoe which is causing the issue at this point and that's due to the contraction that happening with her toe. With that being said patient wonders if there's anything that can be done in that regard for this. She has been tolerating the dressing changes without any complication and is having really no pain. Her right lateral heel wound appears to be slough covered though there is no evidence of infection. No fevers, chills, nausea, or vomiting noted at this time. 08/09/2017 -- since last week her leg has gotten quite red and painful and she was concerned about the possibility of infection. 10/15/2017 -- she had a fall a couple of times last week and hurt her left toe. Electronic Signature(s) Signed: 11/26/2017 3:49:58 PM By: Jessica Kanner MD, FACS Entered By: Jessica Donovan on 11/26/2017 15:49:58 Jessica Donovan ([id number]) -------------------------------------------------------------------------------- Physical Exam Details Patient Name: Jessica Donovan Date of Service: 11/26/2017 3:15 PM Medical Record Number: [id number] Patient Account Number: [id number] Date of Birth/Sex: 03/29/1946 (71 y.o. Female) Treating RN: Jessica Donovan Primary Care Provider: Garlon Donovan Other Clinician: Referring Provider: Garlon Donovan Treating Provider/Extender: Jessica Donovan in Treatment: 20 Constitutional . Pulse regular. Respirations normal and unlabored. Afebrile. . Eyes Nonicteric. Reactive to light. Ears, Nose, Mouth, and Throat Lips, teeth, and gums WNL.[name] Moist mucosa without lesions. Neck supple and nontender. No palpable supraclavicular or cervical  adenopathy. Normal sized without goiter. Respiratory WNL. No retractions.. Cardiovascular Pedal Pulses WNL. No clubbing, cyanosis or edema. Lymphatic No adneopathy. No adenopathy. No adenopathy. Musculoskeletal Adexa without tenderness or enlargement.[name]  Digits and nails w/o clubbing, cyanosis, infection, petechiae, ischemia, or inflammatory conditions.. Integumentary (Hair, Skin) No suspicious lesions. No crepitus or fluctuance. No peri-wound warmth or erythema. No masses.[name]. Psychiatric Judgement and insight Intact.. No evidence of depression, anxiety, or agitation.. Notes the right ankle continues to look good and the right second toe has a very superficial abrasion which will need protection with foam. The left fourth toe did not need any sharp debridement today and overall is looking better Electronic Signature(s) Signed: 11/26/2017 3:50:54 PM By: Jessica KannerBritto, Malissa Slay MD, FACS Entered By: Jessica KannerBritto, Jessica Donovan on 11/26/2017 15:50:54 Jessica Donovan, Jessica Donovan ([id number]) -------------------------------------------------------------------------------- Physician Orders Details Patient Name: Jessica Donovan, Jessica Donovan Date of Service: 11/26/2017 3:15 PM Medical Record Number: [id number] Patient Account Number: [id number] Date of Birth/Sex: 04-09-1946 [age] y.o. Female) Treating RN: Jessica Donovan, Jessica Donovan Primary Care Provider: Garlon HatchetMCCONVILLE, ROBERT Other Clinician: Referring Provider: Garlon HatchetMCCONVILLE, ROBERT Treating Provider/Extender: Jessica ReBritto, Javarie Crisp Weeks in Treatment: 20 Verbal / Phone Orders: No Diagnosis Coding Wound Cleansing Wound #2 Right Malleolus o Cleanse wound with mild soap and water o May Shower, gently pat wound dry prior to applying new dressing. o May shower with protection. Wound #4 Left Toe Fourth o Cleanse wound with mild soap and water o May Shower, gently pat wound dry prior to applying new dressing. o May shower with protection. Anesthetic Wound #2 Right Malleolus o Topical Lidocaine 4% cream  applied to wound bed prior to debridement Wound #4 Left Toe Fourth o Topical Lidocaine 4% cream applied to wound bed prior to debridement Skin Barriers/Peri-Wound Care Wound #2 Right Malleolus o Skin Prep Primary Wound Dressing Wound #2 Right Malleolus o Silvercel Non-Adherent Wound #4 Left Toe Fourth o Silvercel Non-Adherent Secondary Dressing Wound #2 Right Malleolus o Boardered Foam Dressing Wound #4 Left Toe Fourth o Boardered Foam Dressing o Other - coverlet (band-aide) Dressing Change Frequency Wound #2 Right Malleolus o Change dressing every other day. Wound #4 Left Toe Fourth o Change dressing every other day. Jessica Donovan, Jessica Donovan ([id number]) Follow-up Appointments Wound #2 Right Malleolus o Return Appointment in 1 week. o Return Appointment in 2 weeks. Wound #4 Left Toe Fourth o Return Appointment in 2 weeks. Wound #6 Right Toe Second o Return Appointment in 2 weeks. Edema Control Wound #2 Right Malleolus o Elevate legs to the level of the heart and pump ankles as often as possible Wound #4 Left Toe Fourth o Elevate legs to the level of the heart and pump ankles as often as possible Additional Orders / Instructions Wound #2 Right Malleolus o Increase protein intake. o Activity as tolerated Wound #4 Left Toe Fourth o Increase protein intake. o Activity as tolerated Electronic Signature(s) Signed: 11/26/2017 4:35:05 PM By: Jessica KannerBritto, Willian Donson MD, FACS Signed: 11/26/2017 4:47:10 PM By: Jessica Donovan, Jessica Donovan Entered By: Jessica Donovan, Jessica Donovan on 11/26/2017 15:50:41 [place], [name] ([id number]) -------------------------------------------------------------------------------- Problem List Details Patient Name: Jessica Donovan, Takayla Date of Service: 11/26/2017 3:15 PM Medical Record Number: [id number] Patient Account Number: [id number] Date of Birth/Sex: 04-09-1946 (71 y.o. Female) Treating RN: Jessica CoventryWoody, Kim Primary Care Provider: Garlon HatchetMCCONVILLE, ROBERT Other  Clinician: Referring Provider: Garlon HatchetMCCONVILLE, ROBERT Treating Provider/Extender: Jessica ReBritto, Rashana Andrew Weeks in Treatment: 20 Active Problems ICD-10 Encounter Code Description Active Date Diagnosis E11.621 Type 2 diabetes mellitus with foot ulcer 07/08/2017 Yes L97.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 layer exposed 07/08/2017 Yes L97.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 left ankle with fat layer exposed 07/08/2017 Yes L03.115 Cellulitis of right lower limb 08/09/2017 Yes L97.522 Non-pressure chronic ulcer of other part of left foot with fat layer 10/14/2017 Yes exposed Inactive Problems  Resolved Problems Electronic Signature(s) Signed: 11/26/2017 3:49:31 PM By: Jessica Kanner MD, FACS Entered By: Jessica Donovan on 11/26/2017 15:49:31 Jessica Donovan, Jessica Donovan ([id number]) -------------------------------------------------------------------------------- Progress Note Details Patient Name: Jessica Donovan Date of Service: 11/26/2017 3:15 PM Medical Record Number: [id number] Patient Account Number: [id number] Date of Birth/Sex: 11-07-1946 (71 y.o. Female) Treating RN: Jessica Donovan Primary Care Provider: Garlon Donovan Other Clinician: Referring Provider: Garlon Donovan Treating Provider/Extender: Jessica Donovan in Treatment: 20 Subjective Chief Complaint Information obtained from Patient Patients presents for treatment of an open diabetic ulcer to the right ankle the left ankle and the right third toe History of Present Illness (HPI) 71 year old diabetic patient who has chronic back pain, collagen vascular disease, coronary artery disease, hypertension and history of osteomyelitis of the toe in February 2017. 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 She also has had joint replacement surgery in the past. She was recently reviewed by Primary care management team and had a problem with the toe on her right foot and her left ankle had minimal drainage, and the  right ankle was draining worse. She was seen in the ER on 06/22/2017, and on the x-ray, there was no evidence of any acute bony abnormality. 07/22/2017 -- she says she had a mild abrasion to her right second toe which was inadvertent but there is no open ulceration. 07/30/17 on evaluation today patient appears to be doing better in regard to her abrasion to the right second toe. It appears there's little bit of a pressure injury although there is no opening at this point. I think this is pushing up on her shoe which is causing the issue at this point and that's due to the contraction that happening with her toe. With that being said patient wonders if there's anything that can be done in that regard for this. She has been tolerating the dressing changes without any complication and is having really no pain. Her right lateral heel wound appears to be slough covered though there is no evidence of infection. No fevers, chills, nausea, or vomiting noted at this time. 08/09/2017 -- since last week her leg has gotten quite red and painful and she was concerned about the possibility of infection. 10/15/2017 -- she had a fall a couple of times last week and hurt her left toe. Patient History Information obtained from Patient. Family History Stroke - Mother, No family history of Cancer, Diabetes, Heart Disease, Hereditary Spherocytosis, Hypertension, Kidney Disease, Lung Disease, Seizures, Thyroid Problems, Tuberculosis. Social History Never smoker, Marital Status - Single, Alcohol Use - Never, Drug Use - No History, Caffeine Use - Never. Jessica Donovan, Jessica Donovan ([id number]) Objective Constitutional Pulse regular. Respirations normal and unlabored. Afebrile. Vitals Time Taken: 3:11 AM, Height: 66 in, Weight: 191 lbs, BMI: 30.8, Temperature: 98.4 F, Pulse: 101 bpm, Respiratory Rate: 18 breaths/min, Blood Pressure: 139/70 mmHg. Eyes Nonicteric. Reactive to light. Ears, Nose, Mouth, and Throat Lips, teeth,  and gums WNL.[name] Moist mucosa without lesions. Neck supple and nontender. No palpable supraclavicular or cervical adenopathy. Normal sized without goiter. Respiratory WNL. No retractions.. Cardiovascular Pedal Pulses WNL. No clubbing, cyanosis or edema. Lymphatic No adneopathy. No adenopathy. No adenopathy. Musculoskeletal Adexa without tenderness or enlargement.. Digits and nails w/o clubbing, cyanosis, infection, petechiae, ischemia, or inflammatory conditions.[name] Psychiatric Judgement and insight Intact.. No evidence of depression, anxiety, or agitation.. General Notes: the right ankle continues to look good and the right second toe has a very superficial abrasion which will need protection with foam.  The left fourth toe did not need any sharp debridement today and overall is looking better Integumentary (Hair, Skin) No suspicious lesions. No crepitus or fluctuance. No peri-wound warmth or erythema. No masses.. Wound #2 status is Open. Original cause of wound was Gradually Appeared. The wound is located on the Right Malleolus. The wound measures 0.3cm length x 0.4cm width x 0.2cm depth; 0.094cm^2 area and 0.019cm^3 volume. There is no tunneling or undermining noted. There is a large amount of serous drainage noted. The wound margin is flat and intact. There is medium (34-66%) red granulation within the wound bed. There is a medium (34-66%) amount of necrotic tissue within the wound bed including Adherent Slough. The periwound skin appearance exhibited: Excoriation, Rubor, Erythema. The surrounding wound skin color is noted with erythema which is circumferential. Periwound temperature was noted as No Abnormality. The periwound has tenderness on palpation. Wound #4 status is Open. Original cause of wound was Trauma. The wound is located on the Left Toe Fourth. The wound measures 0.5cm length x 0.5cm width x 0.2cm depth; 0.196cm^2 area and 0.039cm^3 volume. There is no tunneling or undermining  noted. There is a large amount of serosanguineous drainage noted. The wound margin is distinct with the outline attached to the wound base. There is medium (34-66%) red granulation within the wound bed. There is a medium (34-66%) amount of necrotic tissue within the wound bed including Eschar and Adherent Slough. The periwound skin appearance exhibited: Callus. The periwound skin appearance did not exhibit: Crepitus, Excoriation, Induration, Rash, Scarring, Dry/Scaly, Maceration, Atrophie Blanche, Cyanosis, Ecchymosis, Hemosiderin Staining, Mottled, Pallor, Rubor, Erythema. Periwound temperature was noted as No Abnormality. The periwound has tenderness on palpation. Wound #6 status is Open. Original cause of wound was Gradually Appeared. The wound is located on the Right Toe Second. Jessica Donovan, Jessica Donovan ([id number]) The wound measures 0.1cm length x 0.1cm width x 0.1cm depth; 0.008cm^2 area and 0.001cm^3 volume. 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 May shower with protection. Wound #4 Left Toe Fourth: Cleanse wound with mild soap and water May Shower, gently pat wound dry prior to applying new dressing. May shower with protection. Anesthetic: Wound #2 Right Malleolus: Topical Lidocaine 4% cream applied to wound bed prior to debridement Wound #4 Left Toe Fourth: Topical Lidocaine 4% cream applied to wound bed prior to debridement Skin Barriers/Peri-Wound Care: Wound #2 Right Malleolus: Skin Prep Primary Wound  Dressing: Wound #2 Right Malleolus: Silvercel Non-Adherent Wound #4 Left Toe Fourth: Silvercel Non-Adherent Secondary Dressing: Wound #2 Right Malleolus: Boardered Foam Dressing Wound #4 Left Toe Fourth: Boardered Foam Dressing Other - coverlet (band-aide) Dressing Change Frequency: Wound #2 Right Malleolus: Change dressing every other day. Wound #4 Left Toe Fourth: Change dressing every other day. Follow-up Appointments: Wound #2 Right Malleolus: Jessica Donovan, Jessica Donovan ([id number]) Return Appointment in 1 week. Return Appointment in 2 weeks. Wound #4 Left Toe Fourth: Return Appointment in 2 weeks. Wound #6 Right Toe Second: Return Appointment in 2 weeks. Edema Control: Wound #2 Right Malleolus: Elevate legs to the level of the heart and pump ankles  as often as possible Wound #4 Left Toe Fourth: Elevate legs to the level of the heart and pump ankles as often as possible Additional Orders / Instructions: Wound #2 Right Malleolus: Increase protein intake. Activity as tolerated Wound #4 Left Toe Fourth: Increase protein intake. Activity as tolerated after review today, I have recommended: 1. Silver AG to the right ankle and the left fourth toe 2. a bordered foam to her left lateral ankle and right second toe 3. good control of her diabetes mellitus 4. Adequate offloading of both these areas Electronic Signature(s) Signed: 11/26/2017 3:52:34 PM By: Jessica KannerBritto, Numa Schroeter MD, FACS Entered By: Jessica KannerBritto, Halei Hanover on 11/26/2017 15:52:34 Jessica Donovan, Jessica Donovan ([id number]) -------------------------------------------------------------------------------- ROS/PFSH Details Patient Name: Jessica Donovan, Jessica Donovan Date of Service: 11/26/2017 3:15 PM Medical Record Number: [id number] Patient Account Number: [id number] Date of Birth/Sex: Apr 04, 1946 [age] y.o. Female) Treating RN: Jessica CoventryWoody, Kim Primary Care Provider: Garlon HatchetMCCONVILLE, ROBERT Other Clinician: Referring Provider: Garlon HatchetMCCONVILLE, ROBERT Treating Provider/Extender: Jessica ReBritto,  Randalyn Ahmed Weeks in Treatment: 20 Information Obtained From Patient Wound History Do you currently have one or more open woundso Yes How many open wounds do you currently haveo 3 How have you been treating your wound(s) until nowo santyl Has your wound(s) ever healed and then Donovan-openedo No Have you had any lab work done in the past montho No Have you tested positive for an antibiotic resistant organism (MRSA, VRE)o No Have you tested positive for osteomyelitis (bone infection)o No Have you had any tests for circulation on your legso No Have you had other problems associated with your woundso Infection Eyes Medical History: Negative for: Cataracts; Glaucoma; Optic Neuritis Ear/Nose/Mouth/Throat Medical History: Negative for: Chronic sinus problems/congestion; Middle ear problems Hematologic/Lymphatic Medical History: Negative for: Anemia; Hemophilia; Human Immunodeficiency Virus; Lymphedema; Sickle Cell Disease Respiratory Medical History: Positive for: Sleep Apnea Negative for: Aspiration; Asthma; Chronic Obstructive Pulmonary Disease (COPD); Pneumothorax; Tuberculosis Cardiovascular Medical History: Positive for: Coronary Artery Disease; Hypertension; Myocardial Infarction Gastrointestinal Medical History: Negative for: Cirrhosis ; Colitis; Crohnos; Hepatitis A; Hepatitis B; Hepatitis C Endocrine Medical History: Positive for: Type II Diabetes Time with diabetes: 20 Buendia, Ikia ([id number]) Treated with: Insulin Blood sugar tested every day: No Blood sugar testing results: Breakfast: 178 Immunological Medical History: Negative for: Lupus Erythematosus; Raynaudos; Scleroderma Integumentary (Skin) Medical History: Positive for: History of pressure wounds Negative for: History of Burn Musculoskeletal Medical History: Positive for: Osteoarthritis Negative for: Gout; Rheumatoid Arthritis; Osteomyelitis Neurologic Medical History: Positive for: Neuropathy Negative for:  Dementia; Quadriplegia; Paraplegia; Seizure Disorder Oncologic Medical History: Negative for: Received Chemotherapy; Received Radiation Psychiatric Medical History: Positive for: Confinement Anxiety Negative for: Anorexia/bulimia Immunizations Pneumococcal Vaccine: Received Pneumococcal Vaccination: No Implantable Devices Family and Social History Cancer: No; Diabetes: No; Heart Disease: No; Hereditary Spherocytosis: No; Hypertension: No; Kidney Disease: No; Lung Disease: No; Seizures: No; Stroke: Yes - Mother; Thyroid Problems: No; Tuberculosis: No; Never smoker; Marital Status - Single; Alcohol Use: Never; Drug Use: No History; Caffeine Use: Never; Financial Concerns: No; Food, Clothing or Shelter Needs: No; Support System Lacking: No; Transportation Concerns: No; Advanced Directives: No; Patient does not want information on Advanced Directives; Living Will: No Physician Affirmation I have reviewed and agree with the above information. Electronic Signature(s) Signed: 11/26/2017 4:35:05 PM By: Jessica KannerBritto, Cornelious Bartolucci MD, FACS Signed: 11/26/2017 5:11:28 PM By: Elliot GurneyWoody, BSN, RN, CWS, Kim RN, BSN Far[place]GLASS, [place] ([id number]) Entered By: Jessica KannerBritto, Toshua Honsinger on 11/26/2017 15:50:06 Jessica Donovan, [place] ([id number]) -------------------------------------------------------------------------------- SuperBill Details Patient Name: Jessica Donovan, Anastasya Date of Service: 11/26/2017 Medical Record Number: [id number] Patient Account Number: [id number] Date of Birth/Sex: Apr 04, 1946 (  70 y.o. 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.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 left ankle with fat layer exposed L03.115  Cellulitis of right lower limb L97.522 Non-pressure chronic ulcer of other part of left foot with fat layer exposed Facility Procedures CPT4 Code: 09811914 Description: 99213 - WOUND CARE VISIT-LEV 3 EST PT Modifier: Quantity: 1 Physician Procedures CPT4 Code: 7829562 Description: 99213 - WC PHYS LEVEL 3 - EST PT ICD-10 Diagnosis Description E11.621 Type 2 diabetes mellitus with foot ulcer L97.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 layer expo L97.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 left ankle with fat  layer expos L97.522 Non-pressure chronic ulcer of other part of left foot with fa Modifier: sed ed t layer exposed Quantity: 1 Electronic Signature(s) Signed: 11/26/2017 3:52:47 PM By: Jessica Kanner MD, FACS Entered By: Jessica Donovan on 11/26/2017 15:52:47

## 2017-12-10 ENCOUNTER — Ambulatory Visit: Payer: PPO | Admitting: Physician Assistant

## 2018-01-03 ENCOUNTER — Other Ambulatory Visit: Payer: Self-pay

## 2018-01-03 ENCOUNTER — Emergency Department: Payer: PPO

## 2018-01-03 ENCOUNTER — Emergency Department
Admission: EM | Admit: 2018-01-03 | Discharge: 2018-01-03 | Disposition: A | Discharge status: Home / Self Care | Payer: PPO | Attending: Emergency Medicine | Admitting: Emergency Medicine

## 2018-01-03 ENCOUNTER — Encounter: Payer: Self-pay | Admitting: Emergency Medicine

## 2018-01-03 DIAGNOSIS — L03113 Cellulitis of right upper limb: Secondary | ICD-10-CM | POA: Diagnosis not present

## 2018-01-03 DIAGNOSIS — F419 Anxiety disorder, unspecified: Secondary | ICD-10-CM | POA: Insufficient documentation

## 2018-01-03 DIAGNOSIS — I10 Essential (primary) hypertension: Secondary | ICD-10-CM | POA: Insufficient documentation

## 2018-01-03 DIAGNOSIS — Z7982 Long term (current) use of aspirin: Secondary | ICD-10-CM | POA: Diagnosis not present

## 2018-01-03 DIAGNOSIS — I252 Old myocardial infarction: Secondary | ICD-10-CM | POA: Insufficient documentation

## 2018-01-03 DIAGNOSIS — M25552 Pain in left hip: Secondary | ICD-10-CM | POA: Insufficient documentation

## 2018-01-03 DIAGNOSIS — R296 Repeated falls: Secondary | ICD-10-CM | POA: Insufficient documentation

## 2018-01-03 DIAGNOSIS — F329 Major depressive disorder, single episode, unspecified: Secondary | ICD-10-CM | POA: Insufficient documentation

## 2018-01-03 DIAGNOSIS — Z8673 Personal history of transient ischemic attack (TIA), and cerebral infarction without residual deficits: Secondary | ICD-10-CM | POA: Insufficient documentation

## 2018-01-03 DIAGNOSIS — F0390 Unspecified dementia without behavioral disturbance: Secondary | ICD-10-CM | POA: Diagnosis not present

## 2018-01-03 DIAGNOSIS — E119 Type 2 diabetes mellitus without complications: Secondary | ICD-10-CM | POA: Insufficient documentation

## 2018-01-03 DIAGNOSIS — Z794 Long term (current) use of insulin: Secondary | ICD-10-CM | POA: Insufficient documentation

## 2018-01-03 DIAGNOSIS — I2510 Atherosclerotic heart disease of native coronary artery without angina pectoris: Secondary | ICD-10-CM | POA: Diagnosis not present

## 2018-01-03 DIAGNOSIS — S0990XA Unspecified injury of head, initial encounter: Secondary | ICD-10-CM | POA: Diagnosis not present

## 2018-01-03 DIAGNOSIS — Z79899 Other long term (current) drug therapy: Secondary | ICD-10-CM | POA: Diagnosis not present

## 2018-01-03 DIAGNOSIS — W19XXXA Unspecified fall, initial encounter: Secondary | ICD-10-CM

## 2018-01-03 LAB — CBC WITH DIFFERENTIAL/PLATELET
Basophils Absolute: 0 10*3/uL (ref 0–0.1)
Basophils Relative: 1 %
EOS PCT: 2 %
Eosinophils Absolute: 0.1 10*3/uL (ref 0–0.7)
HCT: 39.2 % (ref 35.0–47.0)
Hemoglobin: 12.8 g/dL (ref 12.0–16.0)
LYMPHS ABS: 1.7 10*3/uL (ref 1.0–3.6)
LYMPHS PCT: 23 %
MCH: 32.7 pg (ref 26.0–34.0)
MCHC: 32.7 g/dL (ref 32.0–36.0)
MCV: 100.2 fL — AB (ref 80.0–100.0)
Monocytes Absolute: 0.6 10*3/uL (ref 0.2–0.9)
Monocytes Relative: 8 %
Neutro Abs: 4.9 10*3/uL (ref 1.4–6.5)
Neutrophils Relative %: 66 %
Platelets: 290 10*3/uL (ref 150–440)
RBC: 3.91 MIL/uL (ref 3.80–5.20)
RDW: 13.5 % (ref 11.5–14.5)
WBC: 7.3 10*3/uL (ref 3.6–11.0)

## 2018-01-03 LAB — URINALYSIS, COMPLETE (UACMP) WITH MICROSCOPIC
BILIRUBIN URINE: NEGATIVE
Bacteria, UA: NONE SEEN
Glucose, UA: NEGATIVE mg/dL
HGB URINE DIPSTICK: NEGATIVE
Ketones, ur: NEGATIVE mg/dL
Nitrite: NEGATIVE
PH: 5 (ref 5.0–8.0)
Protein, ur: NEGATIVE mg/dL
RBC / HPF: NONE SEEN RBC/hpf (ref 0–5)
SPECIFIC GRAVITY, URINE: 1.026 (ref 1.005–1.030)

## 2018-01-03 LAB — COMPREHENSIVE METABOLIC PANEL
ALK PHOS: 71 U/L (ref 38–126)
ALT: 9 U/L — AB (ref 14–54)
AST: 21 U/L (ref 15–41)
Albumin: 3.7 g/dL (ref 3.5–5.0)
Anion gap: 9 (ref 5–15)
BUN: 23 mg/dL — ABNORMAL HIGH (ref 6–20)
CALCIUM: 9 mg/dL (ref 8.9–10.3)
CO2: 23 mmol/L (ref 22–32)
CREATININE: 0.73 mg/dL (ref 0.44–1.00)
Chloride: 105 mmol/L (ref 101–111)
GFR calc Af Amer: >60 mL/min (ref >60)
Glucose, Bld: 214 mg/dL — ABNORMAL HIGH (ref 65–99)
Potassium: 4.6 mmol/L (ref 3.5–5.1)
Sodium: 137 mmol/L (ref 135–145)
TOTAL PROTEIN: 6.6 g/dL (ref 6.5–8.1)
Total Bilirubin: 0.3 mg/dL (ref 0.3–1.2)

## 2018-01-03 MED ORDER — CLINDAMYCIN HCL 150 MG PO CAPS
450.0000 mg | ORAL_CAPSULE | Freq: Once | ORAL | Status: AC
  Administered 2018-01-03: 450 mg via ORAL
  Filled 2018-01-03: qty 3

## 2018-01-03 MED ORDER — CLINDAMYCIN HCL 150 MG PO CAPS: 450.0000 mg | ORAL_CAPSULE | Freq: Three times a day (TID) | ORAL | 0 refills | Status: AC

## 2018-01-03 NOTE — ED Provider Notes (Signed)
[facility] Emergency Department Provider Note  ____________________________________________  Time seen: Approximately 5:11 PM  I have reviewed the triage vital signs and the nursing notes.   HISTORY  Chief Complaint No chief complaint on file.   HPI Jessica DanceBarbara Donovan is a 72 y.o. female the history of chronic back pain, coronary artery disease, diabetes, hypertension, stroke who presents for evaluation of falls. Patient reports that since having her second stroke she has a history of recurrent falls. She is trying to move and her house is filled with boxes. She keeps tripping and falling on the boxes. She has fallen 5 times in the last 7 days. She reports hitting her head twice a few days ago. She also reports that 3 days ago she fell onto her left hip and then sustained 2 more falls on that same hip.Patient is on chronic morphine twice a day at home for pain. She called her primary care doctor who recommended her to come to the emergency room for x-rays of her hip. She reports that her pain is constant, worse with ambulation, located in the left hip, nonradiating, currently moderate. She has been ambulatory since all these falls. She denies headache, neck pain, chest pain, abdominal pain. She is also complaining of a redness that is located in her right elbow that appeared 10 days ago after she fell and sustained a scratch in the left elbow. No fever or chills. Her sugars have been well controlled at home.  Past Medical History:  Diagnosis Date  . Anxiety   . Chronic back pain   . Collagen vascular disease (HCC)   . Coronary artery disease    a. s/p PCI/DES to dRCA & mLCx in 2014 with repeat LHC in 10/2015 showing patent stents  . Diabetes mellitus without complication (HCC)   . Hypertension   . Low back pain   . Myocardial infarction (HCC)   . Osteomyelitis of toe (HCC) 01/23/2016  . Stroke [facility](HCC)     Patient Active Problem List   Diagnosis Date Noted  .  Anxiety 06/10/2017  . Chest pain with moderate risk for cardiac etiology 06/09/2017  . UTI (urinary tract infection) 05/19/2017  . CAD (coronary artery disease) 05/19/2017  . Chronic narcotic use 05/19/2017  . Cellulitis in diabetic foot (HCC) 05/19/2017  . Opioid overdose (HCC) 02/23/2017  . Altered mental status 02/23/2017  . Acute colitis 01/19/2017  . Sepsis (HCC) 01/17/2017  . Cellulitis of fourth toe of right foot 04/10/2016  . Somnolence 04/09/2016  . Dementia 04/09/2016  . Cellulitis 04/03/2016  . Hematoma of right parietal scalp 02/23/2016  . Multiple falls 02/23/2016  . Physical debility 02/23/2016  . Type II diabetes mellitus, uncontrolled (HCC) 02/23/2016  . Syncope and collapse 02/23/2016  . Polypharmacy 02/23/2016  . Osteomyelitis of toe (HCC) 01/23/2016  . Rhabdomyolysis 12/07/2015  . ARF (acute renal failure) (HCC) 11/29/2015  . Hypotension 11/29/2015  . 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 Angina effort (HCC) 11/07/2015  . Osteomyelitis (HCC) 11/07/2015  . Anemia due to chronic blood loss 06/14/2015  . Generalized anxiety disorder 06/14/2015  . Lumbago with sciatica 06/14/2015  . 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  . 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 Abnormal weight loss 05/18/2014  . Chronic pain syndrome 05/18/2014  . 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 Diabetic polyneuropathy (HCC) 05/18/2014  . Hemorrhage of rectum and anus  01/17/2014  . Cellulitis and abscess of foot excluding toe 05/18/2012    Past Surgical History:  Procedure Laterality Date  . AMPUTATION TOE Left 11/10/2015   Procedure: AMPUTATION TOE;  Surgeon: Linus Galas, MD;  Location: [facility] ORS;  Service: Podiatry;  Laterality: Left;  . AMPUTATION TOE Left 01/24/2016   Procedure: AMPUTATION TOE (2nd mpj);  Surgeon: Linus Galas, DPM;  Location:  [facility] ORS;  Service: Podiatry;  Laterality: Left;  . CARDIAC CATHETERIZATION N/A 11/12/2015   Procedure: Left Heart Cath and Coronary Angiography;  Surgeon: Marcina Millard, MD;  Location: [facility] INVASIVE CV LAB;  Service: Cardiovascular;  Laterality: N/A;  . COLONOSCOPY WITH PROPOFOL N/A 07/20/2017   Procedure: COLONOSCOPY WITH PROPOFOL;  Surgeon: Wyline Mood, MD;  Location: [facility] ENDOSCOPY;  Service: Endoscopy;  Laterality: N/A;  . JOINT REPLACEMENT    . TOE AMPUTATION      Prior to Admission medications   Medication Sig Start Date End Date Taking? Authorizing Provider  ALPRAZolam Prudy Feeler) 1 MG tablet Take 1 mg by mouth 3 (three) times daily.  02/08/17  Yes [provider]  aspirin EC 81 MG tablet Take 81 mg by mouth daily.   Yes [provider]  atorvastatin (LIPITOR) 20 MG tablet Take 20 mg by mouth at bedtime.    Yes [provider]  Biotin 5 MG CAPS Take 5 mg by mouth daily.    Yes [provider]  celecoxib (CELEBREX) 200 MG capsule Take 200 mg by mouth daily.    Yes [provider]  cyclobenzaprine (FLEXERIL) 10 MG tablet Take 1 tablet (10 mg total) by mouth 3 (three) times daily as needed for muscle spasms. 01/27/16  Yes Enid Baas, MD  ferrous sulfate 325 (65 FE) MG tablet Take 325 mg by mouth daily with breakfast.   Yes [provider]  fesoterodine (TOVIAZ) 8 MG TB24 tablet Take 8 mg by mouth at bedtime.    Yes [provider]  insulin aspart (NOVOLOG) 100 UNIT/ML injection Inject 3-15 Units into the skin 3 (three) times daily with meals as needed for high blood sugar. Pt uses as needed per sliding scale:    Less than 140:  0 units  140-180:  3 units 181-220:  4 units 221- 260:  6 units 261- 320:  8 units 321-360:  10 units 361-400:  12 units Greater than 400:  15 units   Yes [provider]  Insulin Glargine (TOUJEO SOLOSTAR) 300 UNIT/ML SOPN Inject 30 Units into the skin daily. 03/01/17  Yes [name], MD  isosorbide mononitrate (IMDUR) 30 MG 24 hr tablet Take 30 mg by mouth daily.    Yes [provider]  liothyronine (CYTOMEL) 25 MCG tablet Take 25 mcg by mouth daily.   Yes [provider]  lisinopril (PRINIVIL,ZESTRIL) 20 MG tablet Take 20 mg by mouth daily.   Yes [provider]  metoprolol tartrate (LOPRESSOR) 25 MG tablet Take 25 mg by mouth 2 (two) times daily. 01/28/17  Yes [provider]  mirtazapine (REMERON) 15 MG tablet Take 15 mg by mouth at bedtime.   Yes [provider]  morphine (MS CONTIN) 15 MG 12 hr tablet Take 1 tablet (15 mg total) by mouth every 12 (twelve) hours. 05/23/17  Yes Auburn Bilberry, MD  Multiple Vitamin (MULTIVITAMIN WITH MINERALS) TABS tablet Take 1 tablet by mouth daily.   Yes [provider]  nitrofurantoin (MACRODANTIN) 100 MG capsule Take 100 mg by mouth daily.   Yes [provider]  omeprazole (PRILOSEC) 20 MG capsule Take 20 mg by mouth daily.   Yes [provider]  pregabalin (LYRICA) 150 MG capsule Take 1 capsule (150 mg total) by mouth 2 (two) times daily. Patient taking differently: Take 150 mg by mouth 3 (three) times daily.  01/19/17  Yes Milagros Loll, MD  traZODone (DESYREL) 100 MG tablet Take 100 mg by mouth at bedtime. 01/28/17  Yes [provider]  venlafaxine XR (EFFEXOR-XR) 75 MG 24 hr capsule Take 75 mg by mouth 3 (three) times daily.    Yes [provider]  vitamin B-12 (CYANOCOBALAMIN) 1000 MCG tablet Take 1,000 mcg by mouth daily.   Yes [provider]  Vitamin D, Ergocalciferol, (DRISDOL) 50000 units CAPS capsule Take 50,000 Units by mouth every Sunday.    Yes [provider]  acetaminophen (TYLENOL) 500 MG tablet Take 1,000 mg by mouth every 4 (four) hours as needed for mild pain or headache.     [provider]  clindamycin (CLEOCIN) 150 MG capsule Take 3 capsules (450 mg total) by mouth 3 (three) times daily for 10 days.  01/03/18 01/13/18  Nita Sickle, MD  diphenoxylate-atropine (LOMOTIL) 2.5-0.025 MG tablet Take 1 tablet by mouth every 4 (four) hours as needed for diarrhea or loose stools.     [provider]  furosemide (LASIX) 20 MG tablet Take 1 tablet (20 mg total) by mouth daily as needed. Patient taking differently: Take 20 mg by mouth daily as needed for fluid.  03/01/17   [name], MD  mupirocin ointment (BACTROBAN) 2 % mupirocin 2 % topical ointment    [provider]  nitroGLYCERIN (NITROSTAT) 0.4 MG SL tablet Place 0.4 mg under the tongue every 5 (five) minutes as needed for chest pain. Reported on 04/16/2016    [provider]  polyethylene glycol powder (GLYCOLAX/MIRALAX) powder 17 grams as needed each time for constpation 06/24/17   Wyline Mood, MD  promethazine (PHENERGAN) 25 MG tablet Take 25 mg by mouth every 6 (six) hours as needed for nausea.     [provider]    Allergies Codeine; Flu virus vaccine; Influenza vaccines; Methadone; Oxycodone-acetaminophen; Percocet [oxycodone-acetaminophen]; Tetanus antitoxin; Tetanus toxoids; Penicillin g; and Tetracyclines & related  Family History  Problem Relation Age of Onset  . CAD Mother   . CAD Father     Social History Social History   Tobacco Use  . Smoking status: Never Smoker  . Smokeless tobacco: Never Used  Substance Use Topics  . Alcohol use: No  . Drug use: No    Review of Systems  Constitutional: Negative for fever. Eyes: Negative for visual changes. ENT: Negative for sore throat. Neck: No neck pain  Cardiovascular: Negative for chest pain. Respiratory: Negative for shortness of breath. Gastrointestinal: Negative for abdominal pain, vomiting or diarrhea. Genitourinary: Negative for dysuria. + R elbow redness and L hip pain Musculoskeletal: Negative for back pain. Skin: Negative for rash. Neurological: Negative for headaches, weakness or numbness. Psych: No SI or  HI  ____________________________________________   PHYSICAL EXAM:  VITAL SIGNS: ED Triage Vitals  Enc Vitals Group     BP 01/03/18 1156 (!) 123/58     Pulse Rate 01/03/18 1156 87     Resp 01/03/18 1156 16     Temp 01/03/18 1156 98.4 F (36.9 C)     Temp Source 01/03/18 1156 Oral     SpO2 01/03/18 1156 92 %     Weight 01/03/18 1157 189 lb (85.7 kg)  Height 01/03/18 1157 5\' 7"  (1.702 m)     Head Circumference --      Peak Flow --      Pain Score --      Pain Loc --      Pain Edu? --      Excl. in GC? --    Constitutional: Alert and oriented. No acute distress. Does not appear intoxicated. HEENT Head: Normocephalic and atraumatic. Face: No facial bony tenderness. Stable midface Ears: No hemotympanum bilaterally. No Battle sign Eyes: No eye injury. PERRL. No raccoon eyes Nose: Nontender. No epistaxis. No rhinorrhea Mouth/Throat: Mucous membranes are moist. No oropharyngeal blood. No dental injury. Airway patent without stridor. Normal voice. Neck: no C-collar in place. No midline c-spine tenderness.  Cardiovascular: Normal rate, regular rhythm. Normal and symmetric distal pulses are present in all extremities. Pulmonary/Chest: Chest wall is stable and nontender to palpation/compression. Normal respiratory effort. Breath sounds are normal. No crepitus.  Abdominal: Soft, nontender, non distended. Musculoskeletal: There is evidence of cellulitis overlying her right elbow with erythema and warmth, she has full painless range of motion and no obvious deformities. Patient is also tender to palpation the anterior aspect of her left hip however has full painless range of motion of the hip. Nontender with normal full range of motion in all extremities. No deformities. No thoracic or lumbar midline spinal tenderness. Pelvis is stable. Skin: Skin is warm, dry and intact. No abrasions or contutions. Psychiatric: Speech and behavior are appropriate. Neurological: Normal speech and  language. Moves all extremities to command. No gross focal neurologic deficits are appreciated.  Glascow Coma Score: 4 - Opens eyes on own 6 - Follows simple motor commands 5 - Alert and oriented GCS: 15  ____________________________________________   LABS (all labs ordered are listed, but only abnormal results are displayed)  Labs Reviewed  CBC WITH DIFFERENTIAL/PLATELET - Abnormal; Notable for the following components:      Result Value   MCV 100.2 (*)    All other components within normal limits  COMPREHENSIVE METABOLIC PANEL - Abnormal; Notable for the following components:   Glucose, Bld 214 (*)    BUN 23 (*)    ALT 9 (*)    All other components within normal limits  URINALYSIS, COMPLETE (UACMP) WITH MICROSCOPIC   ____________________________________________  EKG  ED ECG REPORT I, Nita Sickle, the attending physician, personally viewed and interpreted this ECG.  Normal sinus rhythm, rate of 66, normal intervals, left axis deviation, no ST elevations or depressions, T-wave on anterior and inferior leads. Anterior Q waves are new when compared to prior from September 2018 ____________________________________________  RADIOLOGY  XR L hip: No fracture or dislocation. Hip and sacroiliac joints appear unremarkable. There is degenerative change in the visualized lower lumbar spine.  Head CT:  1. No acute intracranial abnormality. 2. Prominence of the sulci and ventricles compatible with age related brain atrophy. ____________________________________________   PROCEDURES  Procedure(s) performed: None Procedures Critical Care performed:  None ____________________________________________   INITIAL IMPRESSION / ASSESSMENT AND PLAN / ED COURSE   72 y.o. female the history of chronic back pain, coronary artery disease, diabetes, hypertension, stroke who presents for evaluation of falls, L hip pain and R arm cellulitis. Patient does have a walker and a cane  however she isn't moving process and has several boxes in her house and reports that she keeps tripping on the boxes and falling. She is not on any blood thinners. She has no signs of trauma on exam, no  signs or symptoms of basilar skull fracture, she has full painless range of movement of bilateral hips. She does have evidence of cellulitis located over the right elbow. The right elbow has full painless range of motion with no evidence of septic joint. X-ray of the hip is negative for fracture. Head CT negative for any acute injuries. Labs with no evidence of anemia or dehydration. UA is pending to rule out UTI. Patient's been a be started on clindamycin for cellulitis. The area was demarcated for close monitoring. No signs of sepsis at this time.    _________________________ 6:28 PM on 01/03/2018 -----------------------------------------  Patient is extremely well-appearing with no signs of sepsis. She is given a be discharged home on clindamycin. Discussed return precautions and close follow-up with primary care doctor.   As part of my medical decision making, I reviewed the following data within the electronic MEDICAL RECORD NUMBER Nursing notes reviewed and incorporated, Labs reviewed , EKG interpreted , Radiograph reviewed , Notes from prior ED visits and [place] Controlled Substance Database    Pertinent labs & imaging results that were available during my care of the patient were reviewed by me and considered in my medical decision making (see chart for details).    ____________________________________________   FINAL CLINICAL IMPRESSION(S) / ED DIAGNOSES  Final diagnoses:  Fall, initial encounter  Cellulitis of right elbow  Left hip pain      NEW MEDICATIONS STARTED DURING THIS VISIT:  ED Discharge Orders        Ordered    clindamycin (CLEOCIN) 150 MG capsule  3 times daily     01/03/18 1828       Note:  This document was prepared using Dragon voice recognition software and may  include unintentional dictation errors.    Don Perking, [place], MD 01/03/18 [phone]

## 2018-01-03 NOTE — ED Notes (Signed)
Pt sleeping at this time, pt in NAD at this time.

## 2018-01-03 NOTE — Discharge Instructions (Signed)
You were seen in the emergency department after a fall. Luckily all of your imaging studies did not show any evidence of injuries. Follow-up with you doctor within the next 2-3 days for further evaluation. Sometimes injuries can present at a later time and therefore it is imperative that you return to the emergency room if you have a severe headache, facial droop, neck pain, numbness or weakness of your extremities, slurred speech, difficulty finding words, chest pain, back pain, abdominal pain, or any other new symptoms that were not present during this visit. You may take Tylenol at home for your pain.   You have been seen today in the Emergency Department (ED)  for cellulitis, a superficial skin infection. Please take your antibiotics as prescribed for their ENTIRE prescribed duration.    The doctor has checked you carefully, but problems can develop later. If you notice any problems or new symptoms, get medical treatment right away.   Follow-up with your doctor in 24-48 hours for a re-check. If you noticed that redness is expanding, if you develop a fever, or if your symptoms are worsening follow up with your doctor immediately or return to the ER as you may need different antibiotic.    When should you call for help?  Call your doctor now or seek immediate medical care if:  You have signs that your infection is getting worse, such as:  Increased pain, swelling, warmth, or redness.  Red streaks leading from the area.  Pus draining from the area.  A fever. You develop a rash  Watch closely for changes in your health, and be sure to contact your doctor if:  You are not getting better after 1 day (24 hours).  You do not get better as expected.  How can you care for yourself at home?  Take your antibiotics as directed. Do not stop taking them just because you feel better. You need to take the full course of antibiotics.  Prop up the infected area on pillows to reduce pain and swelling. Try to  keep the area above the level of your heart as often as you can.  Keep the skin clean and dry. Change your bandages as your doctor recommended.  Take pain medicines exactly as directed.  If the doctor gave you a prescription medicine for pain, take it as prescribed.  If you are not taking a prescription pain medicine, ask your doctor if you can take an over-the-counter medicine.  To prevent cellulitis in the future  Try to prevent cuts, scrapes, or other injuries to your skin. Cellulitis most often occurs where there is a break in the skin.  If you get a scrape, cut, mild burn, or bite, clean the wound with soap and water as soon as you can to help avoid infection. Don't use hydrogen peroxide or alcohol, which can slow healing.  You may cover the wound with a thin layer of antibiotic ointment, such as bacitracin, and a nonstick bandage.  Apply more ointment and replace the bandage as needed. If you have swelling in your legs (edema), support stockings and good skin care may help prevent leg sores and cellulitis.  Take care of your feet, especially if you have diabetes or other conditions that increase the risk of infection. Wear shoes and socks. Do not go barefoot. If you have athlete's foot or other skin problems on your feet, talk to your doctor about how to treat them.

## 2018-01-03 NOTE — ED Notes (Signed)
Pt appears more alert at this time, pt able to keep eyes open without falling asleep while talking to this RN. Pt able to verify DC instructions/prescription and follow up at this time.

## 2018-01-03 NOTE — ED Notes (Signed)
ED Discharge hard copy signed by pt and placed on chart

## 2018-01-03 NOTE — ED Notes (Signed)
When trying to give discharge instructions to pt, pt would fall back asleep. Pt would state she could hear this RN going over paperwork however audible snoring could be heard when trying to discuss paperwork. At this time, pt states her ride home can be in a taxi and her friend will meet her at the entrance with wheelchair. This RN does not feel comfortable dc'ing pt at this time due to pt not being able to stay awake for more than 20 seconds. When pt is asked why she keeps falling asleep pt states "because I am tired and in pain." Charge RN notified, states pt can be moved to 12H to sleep until she is more alert to be able to walk to taxi and be awake during transport. Pt notified of this, verbalizes understanding.

## 2018-01-03 NOTE — ED Notes (Signed)
Pt resting in bed with eyes closed, respirations even and unlabored. Pt visualized in NAD at this time.

## 2018-01-03 NOTE — ED Triage Notes (Addendum)
Arrives and reports that she has fallen 5 times in the past 7 days.  Patient states she does have frequent falls due to history of a bad back and history of stroke.  Today c/o left hip pain and was sent to ED for eval of hip.  Patient also reports a reddened area to right arm / elbow area x 10 days.  Area is warm to touch and patient reports that area is getting worse.

## 2018-01-03 NOTE — ED Notes (Signed)
Pt given meal tray per her request at this time.

## 2018-01-03 NOTE — ED Notes (Signed)
Pt taken to CT at this time.

## 2018-01-03 NOTE — ED Notes (Signed)
Pt assisted to the bathroom at this time to collect UA.

## 2018-01-21 DIAGNOSIS — M545 Low back pain: Secondary | ICD-10-CM | POA: Diagnosis not present

## 2018-01-21 DIAGNOSIS — M5416 Radiculopathy, lumbar region: Secondary | ICD-10-CM | POA: Diagnosis not present

## 2018-02-15 DIAGNOSIS — M25552 Pain in left hip: Secondary | ICD-10-CM | POA: Diagnosis not present

## 2018-02-15 DIAGNOSIS — M545 Low back pain: Secondary | ICD-10-CM | POA: Diagnosis not present

## 2018-02-15 DIAGNOSIS — M25561 Pain in right knee: Secondary | ICD-10-CM | POA: Diagnosis not present

## 2018-03-11 DIAGNOSIS — F411 Generalized anxiety disorder: Secondary | ICD-10-CM | POA: Diagnosis not present

## 2018-03-11 DIAGNOSIS — F329 Major depressive disorder, single episode, unspecified: Secondary | ICD-10-CM | POA: Diagnosis not present

## 2018-03-11 DIAGNOSIS — R634 Abnormal weight loss: Secondary | ICD-10-CM | POA: Diagnosis not present

## 2018-03-11 DIAGNOSIS — M545 Low back pain: Secondary | ICD-10-CM | POA: Diagnosis not present

## 2018-03-11 DIAGNOSIS — I2510 Atherosclerotic heart disease of native coronary artery without angina pectoris: Secondary | ICD-10-CM | POA: Diagnosis not present

## 2018-03-11 DIAGNOSIS — E119 Type 2 diabetes mellitus without complications: Secondary | ICD-10-CM | POA: Diagnosis not present

## 2018-03-11 DIAGNOSIS — E1142 Type 2 diabetes mellitus with diabetic polyneuropathy: Secondary | ICD-10-CM | POA: Diagnosis not present

## 2018-03-11 DIAGNOSIS — M5441 Lumbago with sciatica, right side: Secondary | ICD-10-CM | POA: Diagnosis not present

## 2018-06-17 ENCOUNTER — Encounter: Payer: Self-pay | Admitting: Podiatry

## 2018-06-17 ENCOUNTER — Ambulatory Visit (INDEPENDENT_AMBULATORY_CARE_PROVIDER_SITE_OTHER): Payer: PPO | Admitting: Podiatry

## 2018-06-17 DIAGNOSIS — E0843 Diabetes mellitus due to underlying condition with diabetic autonomic (poly)neuropathy: Secondary | ICD-10-CM | POA: Diagnosis not present

## 2018-06-17 DIAGNOSIS — I70235 Atherosclerosis of native arteries of right leg with ulceration of other part of foot: Secondary | ICD-10-CM

## 2018-06-17 DIAGNOSIS — L97512 Non-pressure chronic ulcer of other part of right foot with fat layer exposed: Secondary | ICD-10-CM | POA: Diagnosis not present

## 2018-06-17 MED ORDER — GENTAMICIN SULFATE 0.1 % EX CREA: 1.0000 "application " | TOPICAL_CREAM | Freq: Three times a day (TID) | CUTANEOUS | 1 refills | Status: DC

## 2018-06-20 NOTE — Progress Notes (Signed)
   Subjective:  72 year old female with PMHx of T2DM presenting today as a new patient with a chief complaint of bilateral foot pain that has been ongoing for several years. She states she believes she has hammertoes bilaterally. She notes a painful lesion on the right 2nd toe that has been present for the past 6 years. She also complains of callus lesions on the right 4th toe. She has been applying Neosporin ointment to the wound on the 2nd toe for treatment. There are no modifying factors noted. Patient is here for further evaluation and treatment.    Objective/Physical Exam General: The patient is alert and oriented x3 in no acute distress.  Dermatology:  Wound #1 noted to the right 2nd toe measuring 0.9 x 0.9 x 0.1 cm (LxWxD).   To the noted ulceration(s), there is no eschar. There is a moderate amount of slough, fibrin, and necrotic tissue noted. Granulation tissue and wound base is red. There is a minimal amount of serosanguineous drainage noted. There is no exposed bone muscle-tendon ligament or joint. There is no malodor. Periwound integrity is intact. Skin is warm, dry and supple bilateral lower extremities.  Vascular: Palpable pedal pulses bilaterally. No edema or erythema noted. Capillary refill within normal limits.  Neurological: Epicritic and protective threshold diminished bilaterally.   Musculoskeletal Exam: Range of motion within normal limits to all pedal and ankle joints bilateral. Muscle strength 5/5 in all groups bilateral.   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 #2 medically necessary excisional debridement including subcutaneous tissue was performed using a tissue nipper and a chisel blade. Excisional debridement of all the necrotic nonviable tissue down to healthy  bleeding viable tissue was performed with post-debridement measurements same as pre-. #3 the wound was cleansed and dry sterile dressing applied. #4 Prescription for gentamicin cream provided to patient to be used daily with a bandage.  #5 Post op shoe dispensed.  #6 patient is to return to clinic in 3 weeks.   Felecia ShellingBrent M. Deysi Soldo, DPM [facility]  Dr. Felecia ShellingBrent M. Chrysta Fulcher, DPM    [address]                                        [place], [place] [address]                Office [phone]  Fax [phone]

## 2018-07-06 ENCOUNTER — Inpatient Hospital Stay
Admission: EM | Admit: 2018-07-06 | Discharge: 2018-07-07 | DRG: 091 | Disposition: A | Discharge status: Home / Self Care | Payer: PPO | Attending: Internal Medicine | Admitting: Internal Medicine

## 2018-07-06 ENCOUNTER — Other Ambulatory Visit: Payer: Self-pay

## 2018-07-06 ENCOUNTER — Emergency Department: Payer: PPO

## 2018-07-06 DIAGNOSIS — Z79891 Long term (current) use of opiate analgesic: Secondary | ICD-10-CM

## 2018-07-06 DIAGNOSIS — Z7982 Long term (current) use of aspirin: Secondary | ICD-10-CM | POA: Diagnosis not present

## 2018-07-06 DIAGNOSIS — Z9861 Coronary angioplasty status: Secondary | ICD-10-CM

## 2018-07-06 DIAGNOSIS — R4182 Altered mental status, unspecified: Secondary | ICD-10-CM

## 2018-07-06 DIAGNOSIS — F419 Anxiety disorder, unspecified: Secondary | ICD-10-CM | POA: Diagnosis not present

## 2018-07-06 DIAGNOSIS — R402422 Glasgow coma scale score 9-12, at arrival to emergency department: Secondary | ICD-10-CM | POA: Diagnosis not present

## 2018-07-06 DIAGNOSIS — G92 Toxic encephalopathy: Secondary | ICD-10-CM | POA: Diagnosis not present

## 2018-07-06 DIAGNOSIS — Z79899 Other long term (current) drug therapy: Secondary | ICD-10-CM

## 2018-07-06 DIAGNOSIS — G8929 Other chronic pain: Secondary | ICD-10-CM | POA: Diagnosis not present

## 2018-07-06 DIAGNOSIS — Z794 Long term (current) use of insulin: Secondary | ICD-10-CM

## 2018-07-06 DIAGNOSIS — J9601 Acute respiratory failure with hypoxia: Secondary | ICD-10-CM | POA: Diagnosis not present

## 2018-07-06 DIAGNOSIS — N179 Acute kidney failure, unspecified: Secondary | ICD-10-CM | POA: Diagnosis not present

## 2018-07-06 DIAGNOSIS — I252 Old myocardial infarction: Secondary | ICD-10-CM | POA: Diagnosis not present

## 2018-07-06 DIAGNOSIS — Z8673 Personal history of transient ischemic attack (TIA), and cerebral infarction without residual deficits: Secondary | ICD-10-CM

## 2018-07-06 DIAGNOSIS — I10 Essential (primary) hypertension: Secondary | ICD-10-CM | POA: Diagnosis present

## 2018-07-06 DIAGNOSIS — I2510 Atherosclerotic heart disease of native coronary artery without angina pectoris: Secondary | ICD-10-CM | POA: Diagnosis not present

## 2018-07-06 DIAGNOSIS — G9340 Encephalopathy, unspecified: Secondary | ICD-10-CM | POA: Diagnosis not present

## 2018-07-06 DIAGNOSIS — Z8249 Family history of ischemic heart disease and other diseases of the circulatory system: Secondary | ICD-10-CM

## 2018-07-06 DIAGNOSIS — Z89422 Acquired absence of other left toe(s): Secondary | ICD-10-CM | POA: Diagnosis not present

## 2018-07-06 DIAGNOSIS — Z791 Long term (current) use of non-steroidal anti-inflammatories (NSAID): Secondary | ICD-10-CM

## 2018-07-06 DIAGNOSIS — R0602 Shortness of breath: Secondary | ICD-10-CM | POA: Diagnosis not present

## 2018-07-06 DIAGNOSIS — E119 Type 2 diabetes mellitus without complications: Secondary | ICD-10-CM | POA: Diagnosis not present

## 2018-07-06 DIAGNOSIS — M545 Low back pain: Secondary | ICD-10-CM | POA: Diagnosis present

## 2018-07-06 LAB — CBC WITH DIFFERENTIAL/PLATELET
BASOS ABS: 0 10*3/uL (ref 0–0.1)
Basophils Relative: 1 %
Eosinophils Absolute: 0.2 10*3/uL (ref 0–0.7)
Eosinophils Relative: 2 %
HEMATOCRIT: 36.2 % (ref 35.0–47.0)
HEMOGLOBIN: 12.1 g/dL (ref 12.0–16.0)
LYMPHS PCT: 41 %
Lymphs Abs: 3 10*3/uL (ref 1.0–3.6)
MCH: 33.7 pg (ref 26.0–34.0)
MCHC: 33.5 g/dL (ref 32.0–36.0)
MCV: 100.6 fL — AB (ref 80.0–100.0)
MONO ABS: 0.7 10*3/uL (ref 0.2–0.9)
Monocytes Relative: 10 %
NEUTROS ABS: 3.3 10*3/uL (ref 1.4–6.5)
Neutrophils Relative %: 46 %
Platelets: 192 10*3/uL (ref 150–440)
RBC: 3.6 MIL/uL — AB (ref 3.80–5.20)
RDW: 12.9 % (ref 11.5–14.5)
WBC: 7.2 10*3/uL (ref 3.6–11.0)

## 2018-07-06 LAB — BASIC METABOLIC PANEL
ANION GAP: 7 (ref 5–15)
BUN: 34 mg/dL — ABNORMAL HIGH (ref 8–23)
CO2: 27 mmol/L (ref 22–32)
Calcium: 8.5 mg/dL — ABNORMAL LOW (ref 8.9–10.3)
Chloride: 107 mmol/L (ref 98–111)
Creatinine, Ser: 1.26 mg/dL — ABNORMAL HIGH (ref 0.44–1.00)
GFR calc Af Amer: 48 mL/min — ABNORMAL LOW (ref >60)
GFR calc non Af Amer: 42 mL/min — ABNORMAL LOW (ref >60)
GLUCOSE: 104 mg/dL — AB (ref 70–99)
POTASSIUM: 4.5 mmol/L (ref 3.5–5.1)
Sodium: 141 mmol/L (ref 135–145)

## 2018-07-06 LAB — URINALYSIS, ROUTINE W REFLEX MICROSCOPIC
Bacteria, UA: NONE SEEN
Bilirubin Urine: NEGATIVE
GLUCOSE, UA: NEGATIVE mg/dL
HGB URINE DIPSTICK: NEGATIVE
Ketones, ur: NEGATIVE mg/dL
LEUKOCYTES UA: NEGATIVE
NITRITE: NEGATIVE
Protein, ur: NEGATIVE mg/dL
SPECIFIC GRAVITY, URINE: 1.026 (ref 1.005–1.030)
SQUAMOUS EPITHELIAL / LPF: NONE SEEN (ref 0–5)
pH: 5 (ref 5.0–8.0)

## 2018-07-06 LAB — BLOOD GAS, ARTERIAL
Acid-base deficit: 3.2 mmol/L — ABNORMAL HIGH (ref 0.0–2.0)
BICARBONATE: 24 mmol/L (ref 20.0–28.0)
FIO2: 0.21
O2 Saturation: 83 %
PATIENT TEMPERATURE: 37
PH ART: 7.28 — AB (ref 7.350–7.450)
pCO2 arterial: 51 mmHg — ABNORMAL HIGH (ref 32.0–48.0)
pO2, Arterial: 54 mmHg — ABNORMAL LOW (ref 83.0–108.0)

## 2018-07-06 LAB — ACETAMINOPHEN LEVEL: ACETAMINOPHEN (TYLENOL), SERUM: 22 ug/mL (ref 10–30)

## 2018-07-06 LAB — SALICYLATE LEVEL

## 2018-07-06 LAB — ETHANOL: Alcohol, Ethyl (B): <10 mg/dL (ref <10)

## 2018-07-06 LAB — GLUCOSE, CAPILLARY: GLUCOSE-CAPILLARY: 92 mg/dL (ref 70–99)

## 2018-07-06 LAB — TROPONIN I: Troponin I: <0.03 ng/mL (ref <0.03)

## 2018-07-06 MED ORDER — LEVOFLOXACIN IN D5W 750 MG/150ML IV SOLN
750.0000 mg | Freq: Once | INTRAVENOUS | Status: AC
  Administered 2018-07-06: 750 mg via INTRAVENOUS
  Filled 2018-07-06: qty 150

## 2018-07-06 MED ORDER — NALOXONE HCL 2 MG/2ML IJ SOSY
0.4000 mg | PREFILLED_SYRINGE | Freq: Once | INTRAMUSCULAR | Status: AC
  Administered 2018-07-06: 0.4 mg via INTRAVENOUS

## 2018-07-06 MED ORDER — SODIUM CHLORIDE 0.9 % IV BOLUS
1000.0000 mL | Freq: Once | INTRAVENOUS | Status: AC
  Administered 2018-07-06: 1000 mL via INTRAVENOUS

## 2018-07-06 MED ORDER — VANCOMYCIN HCL IN DEXTROSE 1-5 GM/200ML-% IV SOLN
1000.0000 mg | Freq: Once | INTRAVENOUS | Status: AC
  Administered 2018-07-06: 1000 mg via INTRAVENOUS
  Filled 2018-07-06: qty 200

## 2018-07-06 MED ORDER — NALOXONE HCL 2 MG/2ML IJ SOSY
PREFILLED_SYRINGE | INTRAMUSCULAR | Status: AC
  Administered 2018-07-06: 0.4 mg via INTRAVENOUS
  Filled 2018-07-06: qty 2

## 2018-07-06 MED ORDER — AZTREONAM 2 G IJ SOLR
2.0000 g | Freq: Once | INTRAMUSCULAR | Status: AC
  Administered 2018-07-06: 2 g via INTRAVENOUS
  Filled 2018-07-06: qty 2

## 2018-07-06 NOTE — Progress Notes (Signed)
CODE SEPSIS - PHARMACY COMMUNICATION  **Broad Spectrum Antibiotics should be administered within 1 hour of Sepsis diagnosis**  Time Code Sepsis Called/Page Received: 2112  Antibiotics Ordered: vancomycin, Levaquin, and aztreonam  Time of 1st antibiotic administration: 2141  Additional action taken by pharmacy:   If necessary, Name of Provider/Nurse Contacted:     Valentina Guhristy, Syriana Croslin D ,PharmD Clinical Pharmacist  07/06/2018  9:12 PM

## 2018-07-06 NOTE — Progress Notes (Signed)
CODE SEPSIS - PHARMACY COMMUNICATION  **Broad Spectrum Antibiotics should be administered within 1 hour of Sepsis diagnosis**  Time Code Sepsis Called/Page Received: 2108  Antibiotics Ordered: vanc/levaquin/aztreonam  Time of 1st antibiotic administration: 2141  Additional action taken by pharmacy:   If necessary, Name of Provider/Nurse Contacted:     Thomasene Rippleavid  Kinston Magnan ,PharmD Clinical Pharmacist  07/06/2018  10:10 PM

## 2018-07-06 NOTE — ED Triage Notes (Signed)
Pt arrived via ems with lethargy and respiratory difficulty. Pt confused and not making sense when talking. LKW as yesterday morning. BP 80/30 on scene. 84% RA on ems arrival, 100% on 6L non rebreather. Hx of stroke, DM, and MI.

## 2018-07-06 NOTE — ED Notes (Signed)
Patient transported to CT 

## 2018-07-06 NOTE — H&P (Addendum)
[facility] [place] [facility]   PATIENT NAME: Jessica DanceBarbara Donovan    MR#:  [id number]  DATE OF BIRTH:  08/22/1946  DATE OF ADMISSION:  07/06/2018  PRIMARY CARE PHYSICIAN: Garlon HatchetMcConville, Robert, MD   REQUESTING/REFERRING PHYSICIAN:   CHIEF COMPLAINT:   Chief Complaint  Patient presents with  . Respiratory Distress    HISTORY OF PRESENT ILLNESS: Jessica Donovan  is a 72 y.o. female with a known history of chronic back pain, CAD, diabetes type 2. Patient is unable to provide history due to altered mental status.  Most information was taken from reviewing the medical chart and from discussion with emergency room physician. Apparently, patient was found unresponsive by her neighbor, who walks her dog.  Per EMS report patient was responding to painful stimuli only; her oxygen saturation was 84% on room air.  Patient became slightly more alert after Narcan given by paramedics. Blood test done in the emergency room are notable for creatinine level of 1.26 and BUN 34 CBC is within normal limits. Chest x-ray and CT of the brain, reviewed by myself, are negative for any acute abnormalities.  UA is negative for UTI. Alcohol level is within normal limits.  UDS is positive for opiates and tricyclics. Patient is admitted for further evaluation and treatment.  PAST MEDICAL HISTORY:   Past Medical History:  Diagnosis Date  . Anxiety   . Chronic back pain   . Collagen vascular disease (HCC)   . Coronary artery disease    a. s/p PCI/DES to dRCA & mLCx in 2014 with repeat LHC in 10/2015 showing patent stents  . Diabetes mellitus without complication (HCC)   . Hypertension   . Low back pain   . Myocardial infarction (HCC)   . Osteomyelitis of toe (HCC) 01/23/2016  . Stroke [facility](HCC)     PAST SURGICAL HISTORY:  Past Surgical History:  Procedure Laterality Date  . AMPUTATION TOE Left 11/10/2015   Procedure: AMPUTATION TOE;  Surgeon: Linus Galasodd Cline, MD;  Location: [facility] ORS;  Service:  Podiatry;  Laterality: Left;  . AMPUTATION TOE Left 01/24/2016   Procedure: AMPUTATION TOE (2nd mpj);  Surgeon: Linus Galasodd Cline, DPM;  Location: [facility] ORS;  Service: Podiatry;  Laterality: Left;  . CARDIAC CATHETERIZATION N/A 11/12/2015   Procedure: Left Heart Cath and Coronary Angiography;  Surgeon: Marcina MillardAlexander Paraschos, MD;  Location: [facility] INVASIVE CV LAB;  Service: Cardiovascular;  Laterality: N/A;  . COLONOSCOPY WITH PROPOFOL N/A 07/20/2017   Procedure: COLONOSCOPY WITH PROPOFOL;  Surgeon: Wyline MoodAnna, Kiran, MD;  [facility];  Service: Endoscopy;  Laterality: N/A;  . JOINT REPLACEMENT    . TOE AMPUTATION      SOCIAL HISTORY:  Social History   Tobacco Use  . Smoking status: Never Smoker  . Smokeless tobacco: Never Used  Substance Use Topics  . Alcohol use: No    FAMILY HISTORY:  Family History  Problem Relation Age of Onset  . CAD Mother   . CAD Father     DRUG ALLERGIES:  Allergies  Allergen Reactions  . Codeine Nausea And Vomiting  . Flu Virus Vaccine     Other reaction(s): Other (See Comments) Passed out for 12 days  . Influenza Vaccines Other (See Comments)    Reaction:  Caused pt to pass out   . Methadone Hives and Itching  . Oxycodone-Acetaminophen Nausea And Vomiting  . Percocet [Oxycodone-Acetaminophen] Nausea And Vomiting  . Tetanus Antitoxin   . Tetanus Toxoids Swelling and Other (See Comments)  Reaction:  Swelling at injection site  . Penicillin G Rash  . Tetracyclines & Related Rash    REVIEW OF SYSTEMS:   Unable to obtain due to patient's altered mental status.   MEDICATIONS AT HOME:  Prior to Admission medications   Medication Sig Start Date End Date Taking? Authorizing Provider  ALPRAZolam Prudy Feeler) 1 MG tablet Take 1 mg by mouth 3 (three) times daily.  02/08/17  Yes [provider]  aspirin EC 81 MG tablet Take 81 mg by mouth daily.   Yes [provider]  atorvastatin (LIPITOR) 20 MG tablet Take 20 mg by mouth at bedtime.     Yes [provider]  Biotin 5 MG CAPS Take 5 mg by mouth daily.    Yes [provider]  celecoxib (CELEBREX) 200 MG capsule Take 200 mg by mouth daily.    Yes [provider]  cyclobenzaprine (FLEXERIL) 10 MG tablet Take 1 tablet (10 mg total) by mouth 3 (three) times daily as needed for muscle spasms. 01/27/16  Yes Enid Baas, MD  diphenoxylate-atropine (LOMOTIL) 2.5-0.025 MG tablet Take 1 tablet by mouth every 4 (four) hours as needed for diarrhea or loose stools.    Yes [provider]  ferrous sulfate 325 (65 FE) MG tablet Take 325 mg by mouth daily with breakfast.   Yes [provider]  insulin aspart (NOVOLOG) 100 UNIT/ML injection Inject 3-15 Units into the skin 3 (three) times daily with meals as needed for high blood sugar. Pt uses as needed per sliding scale:    Less than 140:  0 units  140-180:  3 units 181-220:  4 units 221- 260:  6 units 261- 320:  8 units 321-360:  10 units 361-400:  12 units Greater than 400:  15 units   Yes [provider]  Insulin Glargine (TOUJEO SOLOSTAR) 300 UNIT/ML SOPN Inject 30 Units into the skin daily. 03/01/17  Yes [name], MD  isosorbide mononitrate (IMDUR) 30 MG 24 hr tablet Take 30 mg by mouth daily.    Yes [provider]  lisinopril (PRINIVIL,ZESTRIL) 20 MG tablet Take 20 mg by mouth daily.   Yes [provider]  metoprolol tartrate (LOPRESSOR) 25 MG tablet Take 25 mg by mouth 2 (two) times daily. 01/28/17  Yes [provider]  mirtazapine (REMERON) 15 MG tablet Take 15 mg by mouth at bedtime.   Yes [provider]  morphine (MS CONTIN) 15 MG 12 hr tablet Take 1 tablet (15 mg total) by mouth every 12 (twelve) hours. 05/23/17  Yes Auburn Bilberry, MD  morphine (MSIR) 15 MG tablet Take 15 mg by mouth every 4 (four) hours as needed for severe pain.   Yes [provider]  nitrofurantoin (MACRODANTIN) 100 MG capsule Take 100 mg by mouth daily.   Yes  [provider]  nitroGLYCERIN (NITROSTAT) 0.4 MG SL tablet Place 0.4 mg under the tongue every 5 (five) minutes as needed for chest pain. Reported on 04/16/2016   Yes [provider]  omeprazole (PRILOSEC) 20 MG capsule Take 20 mg by mouth daily.   Yes [provider]  pregabalin (LYRICA) 150 MG capsule Take 1 capsule (150 mg total) by mouth 2 (two) times daily. Patient taking differently: Take 150 mg by mouth 3 (three) times daily.  01/19/17  Yes Milagros Loll, MD  traZODone (DESYREL) 100 MG tablet Take 100 mg by mouth at bedtime. 01/28/17  Yes [provider]  venlafaxine XR (EFFEXOR-XR) 75 MG 24 hr  capsule Take 75 mg by mouth 3 (three) times daily.    Yes [provider]  vitamin B-12 (CYANOCOBALAMIN) 1000 MCG tablet Take 1,000 mcg by mouth daily.   Yes [provider]  Vitamin D, Ergocalciferol, (DRISDOL) 50000 units CAPS capsule Take 50,000 Units by mouth every Sunday.    Yes [provider]  acetaminophen (TYLENOL) 500 MG tablet Take 1,000 mg by mouth every 4 (four) hours as needed for mild pain or headache.     [provider]  furosemide (LASIX) 20 MG tablet Take 1 tablet (20 mg total) by mouth daily as needed. Patient not taking: Reported on 07/06/2018 03/01/17   [name], MD  gentamicin cream (GARAMYCIN) 0.1 % Apply 1 application topically 3 (three) times daily. Patient not taking: Reported on 07/06/2018 06/17/18   Felecia Shelling, DPM  Multiple Vitamin (MULTIVITAMIN WITH MINERALS) TABS tablet Take 1 tablet by mouth daily.    [provider]  polyethylene glycol powder (GLYCOLAX/MIRALAX) powder 17 grams as needed each time for constpation 06/24/17   Wyline Mood, MD      PHYSICAL EXAMINATION:   VITAL SIGNS: Blood pressure (!) 110/53, pulse 64, temperature (!) 97.2 F (36.2 C), temperature source Rectal, resp. rate 14, height 5\' 5"  (1.651 m), weight 79.4 kg (175 lb), SpO2 97 %.  GENERAL:  72 y.o.-year-old  patient lying in the bed, lethargic, drowsy.  Responds to deep pain stimuli only. EYES: Pupils equal, round, reactive to light and accommodation. No scleral icterus. Extraocular muscles intact.  HEENT: Head atraumatic, normocephalic. Oropharynx and nasopharynx clear.  NECK:  Supple, no jugular venous distention. No thyroid enlargement, no tenderness.  LUNGS: Normal breath sounds bilaterally, no wheezing, rales,rhonchi or crepitation. No use of accessory muscles of respiration.  CARDIOVASCULAR: S1, S2 normal. No S3/S4.  ABDOMEN: Soft, nontender, nondistended. Bowel sounds present. No organomegaly or mass.  EXTREMITIES: No pedal edema, cyanosis, or clubbing.  NEUROLOGIC EXAM: Is limited due to patient's altered mental status.  She is moving all her extremities, no focal weakness appreciated. PSYCHIATRIC: The patient is obtunded.  SKIN: No obvious rash, lesion, or ulcer.   LABORATORY PANEL:   CBC Recent Labs  Lab 07/06/18 2101  WBC 7.2  HGB 12.1  HCT 36.2  PLT 192  MCV 100.6*  MCH 33.7  MCHC 33.5  RDW 12.9  LYMPHSABS 3.0  MONOABS 0.7  EOSABS 0.2  BASOSABS 0.0   ------------------------------------------------------------------------------------------------------------------  Chemistries  Recent Labs  Lab 07/06/18 2101  NA 141  K 4.5  CL 107  CO2 27  GLUCOSE 104*  BUN 34*  CREATININE 1.26*  CALCIUM 8.5*   ------------------------------------------------------------------------------------------------------------------ estimated creatinine clearance is 42.7 mL/min (A) (by C-G formula based on SCr of 1.26 mg/dL (H)). ------------------------------------------------------------------------------------------------------------------ No results for input(s): TSH, T4TOTAL, T3FREE, THYROIDAB in the last 72 hours.  Invalid input(s): FREET3   Coagulation profile No results for input(s): INR, PROTIME in the last 168  hours. ------------------------------------------------------------------------------------------------------------------- No results for input(s): DDIMER in the last 72 hours. -------------------------------------------------------------------------------------------------------------------  Cardiac Enzymes Recent Labs  Lab 07/06/18 2101  TROPONINI <0.03   ------------------------------------------------------------------------------------------------------------------ Invalid input(s): POCBNP  ---------------------------------------------------------------------------------------------------------------  Urinalysis    Component Value Date/Time   COLORURINE YELLOW (A) 07/06/2018 2148   APPEARANCEUR CLEAR (A) 07/06/2018 2148   APPEARANCEUR Hazy 12/15/2014 0958   LABSPEC 1.026 07/06/2018 2148   LABSPEC 1.014 12/15/2014 0958   PHURINE 5.0 07/06/2018 2148   GLUCOSEU NEGATIVE 07/06/2018 2148   GLUCOSEU Negative 12/15/2014 0958   HGBUR NEGATIVE 07/06/2018 2148  BILIRUBINUR NEGATIVE 07/06/2018 2148   BILIRUBINUR Negative 12/15/2014 0958   KETONESUR NEGATIVE 07/06/2018 2148   PROTEINUR NEGATIVE 07/06/2018 2148   NITRITE NEGATIVE 07/06/2018 2148   LEUKOCYTESUR NEGATIVE 07/06/2018 2148   LEUKOCYTESUR 1+ 12/15/2014 0958     RADIOLOGY: Dg Chest 1 View  Result Date: 07/06/2018 CLINICAL DATA:  72 year old female with shortness of breath. EXAM: CHEST  1 VIEW COMPARISON:  Chest radiograph dated 06/09/2017 FINDINGS: There is shallow inspiration with mild chronic bronchitic changes. No focal consolidation, pleural effusion, or pneumothorax. The cardiac silhouette is within normal limits. No acute osseous pathology. IMPRESSION: No active disease. Electronically Signed   By: Elgie Collard M.D.   On: 07/06/2018 21:31   Ct Head Wo Contrast  Result Date: 07/06/2018 CLINICAL DATA:  Encephalopathy EXAM: CT HEAD WITHOUT CONTRAST TECHNIQUE: Contiguous axial images were obtained from the base  of the skull through the vertex without intravenous contrast. COMPARISON:  Head CT 01/03/2018 FINDINGS: Brain: There is no mass, hemorrhage or extra-axial collection. The size and configuration of the ventricles and extra-axial CSF spaces are normal. There is no acute or chronic infarction. There is hypoattenuation of the periventricular white matter, most commonly indicating chronic ischemic microangiopathy. Vascular: No abnormal hyperdensity of the major intracranial arteries or dural venous sinuses. No intracranial atherosclerosis. Skull: The visualized skull base, calvarium and extracranial soft tissues are normal. Sinuses/Orbits: No fluid levels or advanced mucosal thickening of the visualized paranasal sinuses. No mastoid or middle ear effusion. The orbits are normal. IMPRESSION: No acute abnormality.  Findings of chronic small vessel disease. Electronically Signed   By: Deatra Robinson M.D.   On: 07/06/2018 22:57    EKG: Orders placed or performed during the hospital encounter of 07/06/18  . EKG 12-Lead  . EKG 12-Lead  . ED EKG 12-Lead  . ED EKG 12-Lead    IMPRESSION AND PLAN:  1.  Acute encephalopathy, likely related to overuse of medications with sedative effect, like pain medications, muscle relaxants and antianxiety pills.  We will continue to monitor clinically closely.  Continue supportive therapy with IV fluids and oxygen.  Avoid sedatives. 2.  Acute respiratory failure, secondary to medication overdose.  Hold sedatives and continue oxygen therapy. 3. DM2.  Patient is currently n.p.o. due to altered mental status.  Will monitor blood sugars every 6 hours and use insulin sliding scale as needed.  We will hold scheduled insulin doses at this time. 4.  Chronic pain.  Patient would need adjustment in the doses of her pain medications.  We will hold all medications for now, patient is very obtunded.  She does not seem to be in pain at this time. 5.  Acute renal failure, likely prerenal.  We  will start gentle IV hydration and monitor kidney function closely.  Avoid nephrotoxic medications.  All the records are reviewed and case discussed with ED provider. Management plans discussed with the patient, family and they are in agreement.  CODE STATUS: DNR Code Status History    Date Active Date Inactive Code Status Order ID Comments User Context   06/10/2017 1446 06/10/2017 2022 DNR [id number]  Delfino Lovett, MD Inpatient   06/09/2017 1743 06/10/2017 1446 Full Code [id number]  Enid Baas, MD Inpatient   05/19/2017 2144 05/23/2017 1719 DNR [id number]  Oralia Manis, MD Inpatient   02/23/2017 1640 03/01/2017 1902 DNR [id number]  Altamese Dilling, MD Inpatient   01/17/2017 0407 01/19/2017 2010 DNR [id number]  Arnaldo Natal, MD Inpatient   04/09/2016 [phone] 04/10/2016 2044 DNR [id number]  Arnaldo Natal, MD Inpatient   04/09/2016 0205 04/09/2016 0311 Full Code [id number]  Oralia Manis, MD Inpatient   04/03/2016 1853 04/04/2016 1817 Full Code [id number]  Houston Siren, MD Inpatient   02/23/2016 2252 02/25/2016 1637 DNR [id number]  Robley Fries, MD Inpatient   01/24/2016 1554 01/27/2016 1906 DNR [id number]  Milagros Loll, MD Inpatient   01/23/2016 1607 01/23/2016 1622 Full Code [id number]  Shaune Pollack, MD Inpatient   12/07/2015 2223 12/09/2015 1858 Full Code [id number]  Auburn Bilberry, MD Inpatient   11/29/2015 1741 12/01/2015 1833 DNR [id number]  Shaune Pollack, MD Inpatient   11/10/2015 0954 11/12/2015 2139 DNR [id number]  Linus Galas, MD Inpatient   11/07/2015 1732 11/10/2015 0954 DNR [id number]  Altamese Dilling, MD Inpatient   11/07/2015 1549 11/07/2015 1732 Full Code [id number]  Altamese Dilling, MD Inpatient    Questions for Most Recent Historical Code Status (Order [id number])    Question Answer Comment   In the event of cardiac or respiratory ARREST Do not call a "code blue"    In the event of cardiac or respiratory ARREST Do not perform Intubation, CPR, defibrillation or ACLS    In the  event of cardiac or respiratory ARREST Use medication by any route, position, wound care, and other measures to relive pain and suffering. May use oxygen, suction and manual treatment of airway obstruction as needed for comfort.        TOTAL TIME TAKING CARE OF THIS PATIENT: 45 minutes.    Cammy Copa M.D on 07/06/2018 at 11:54 PM  Between 7am to 6pm - Pager - [phone]  After 6pm go to [email or web address] - password [email or web address] [facility]  [facility]  Office  [phone]  CC: Primary care physician; Garlon Hatchet, MD

## 2018-07-06 NOTE — ED Notes (Signed)
Pt O2 sats running between 89-90 on RA. @L  applied BNC, O2 sat 93-94%.

## 2018-07-06 NOTE — ED Provider Notes (Signed)
[facility] Emergency Department Provider Note ____________________________________________   First MD Initiated Contact with Patient 07/06/18 2100     (approximate)  I have reviewed the triage vital signs and the nursing notes.   HISTORY  Chief Complaint Respiratory Distress  HPI Jessica Donovan is a 72 y.o. female with history of anxiety, chronic back pain and coronary artery disease on morphine as well as Ativan was presenting with altered mental status.  EMS reported the patient was found to be 84% in route and placed her on a nonrebreather mask.  Per the patient's neighbor, who walks patient's dog, the patient was becoming somnolent this morning.  However, when the neighbor came to the house even to walk the dog the patient was unresponsive.  EMS reports the patient will respond to painful stimulus.  Found the patient's pupils to be 5 mm and reactive bilaterally so did not give Narcan.  Patient unable to give further history at this time as she is altered.  Past Medical History:  Diagnosis Date  . Anxiety   . Chronic back pain   . Collagen vascular disease (HCC)   . Coronary artery disease    a. s/p PCI/DES to dRCA & mLCx in 2014 with repeat LHC in 10/2015 showing patent stents  . Diabetes mellitus without complication (HCC)   . Hypertension   . Low back pain   . Myocardial infarction (HCC)   . Osteomyelitis of toe (HCC) 01/23/2016  . Stroke [facility](HCC)     Patient Active Problem List   Diagnosis Date Noted  . Anxiety 06/10/2017  . Chest pain with moderate risk for cardiac etiology 06/09/2017  . UTI (urinary tract infection) 05/19/2017  . CAD (coronary artery disease) 05/19/2017  . Chronic narcotic use 05/19/2017  . Cellulitis in diabetic foot (HCC) 05/19/2017  . Opioid overdose (HCC) 02/23/2017  . Altered mental status 02/23/2017  . Acute colitis 01/19/2017  . Sepsis (HCC) 01/17/2017  . Cellulitis of fourth toe of right foot 04/10/2016  . Somnolence  04/09/2016  . Dementia 04/09/2016  . Cellulitis 04/03/2016  . Hematoma of right parietal scalp 02/23/2016  . Multiple falls 02/23/2016  . Physical debility 02/23/2016  . Type II diabetes mellitus, uncontrolled (HCC) 02/23/2016  . Syncope and collapse 02/23/2016  . Polypharmacy 02/23/2016  . Osteomyelitis of toe (HCC) 01/23/2016  . Rhabdomyolysis 12/07/2015  . ARF (acute renal failure) (HCC) 11/29/2015  . Hypotension 11/29/2015  . 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 Angina effort 11/07/2015  . Osteomyelitis (HCC) 11/07/2015  . Anemia due to chronic blood loss 06/14/2015  . Generalized anxiety disorder 06/14/2015  . Lumbago with sciatica 06/14/2015  . 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  . 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 Abnormal weight loss 05/18/2014  . Chronic pain syndrome 05/18/2014  . 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 Diabetic polyneuropathy (HCC) 05/18/2014  . Type 2 diabetes mellitus with diabetic neuropathy (HCC) 05/07/2014  . Hemorrhage of rectum and anus 01/17/2014  . Cellulitis and abscess of foot excluding toe 05/18/2012    Past Surgical History:  Procedure Laterality Date  . AMPUTATION TOE Left 11/10/2015   Procedure: AMPUTATION TOE;  Surgeon: Linus Galasodd Cline, MD;  Location: [facility] ORS;  Service: Podiatry;  Laterality: Left;  . AMPUTATION TOE Left 01/24/2016   Procedure: AMPUTATION TOE (2nd mpj);  Surgeon: Linus Galasodd Cline, DPM;  Location: [facility] ORS;  Service: Podiatry;  Laterality: Left;  . CARDIAC CATHETERIZATION N/A 11/12/2015   Procedure: Left Heart Cath and Coronary Angiography;  Surgeon: Marcina Millard, MD;  Location: [facility] INVASIVE CV LAB;  Service: Cardiovascular;  Laterality: N/A;  . COLONOSCOPY WITH PROPOFOL N/A 07/20/2017   Procedure: COLONOSCOPY WITH PROPOFOL;  Surgeon: Wyline Mood, MD;  Location: [facility]  ENDOSCOPY;  Service: Endoscopy;  Laterality: N/A;  . JOINT REPLACEMENT    . TOE AMPUTATION      Prior to Admission medications   Medication Sig Start Date End Date Taking? Authorizing Provider  ALPRAZolam Prudy Feeler) 1 MG tablet Take 1 mg by mouth 3 (three) times daily.  02/08/17  Yes [provider]  aspirin EC 81 MG tablet Take 81 mg by mouth daily.   Yes [provider]  atorvastatin (LIPITOR) 20 MG tablet Take 20 mg by mouth at bedtime.    Yes [provider]  Biotin 5 MG CAPS Take 5 mg by mouth daily.    Yes [provider]  celecoxib (CELEBREX) 200 MG capsule Take 200 mg by mouth daily.    Yes [provider]  cyclobenzaprine (FLEXERIL) 10 MG tablet Take 1 tablet (10 mg total) by mouth 3 (three) times daily as needed for muscle spasms. 01/27/16  Yes Enid Baas, MD  diphenoxylate-atropine (LOMOTIL) 2.5-0.025 MG tablet Take 1 tablet by mouth every 4 (four) hours as needed for diarrhea or loose stools.    Yes [provider]  ferrous sulfate 325 (65 FE) MG tablet Take 325 mg by mouth daily with breakfast.   Yes [provider]  insulin aspart (NOVOLOG) 100 UNIT/ML injection Inject 3-15 Units into the skin 3 (three) times daily with meals as needed for high blood sugar. Pt uses as needed per sliding scale:    Less than 140:  0 units  140-180:  3 units 181-220:  4 units 221- 260:  6 units 261- 320:  8 units 321-360:  10 units 361-400:  12 units Greater than 400:  15 units   Yes [provider]  Insulin Glargine (TOUJEO SOLOSTAR) 300 UNIT/ML SOPN Inject 30 Units into the skin daily. 03/01/17  Yes [name], MD  isosorbide mononitrate (IMDUR) 30 MG 24 hr tablet Take 30 mg by mouth daily.    Yes [provider]  lisinopril (PRINIVIL,ZESTRIL) 20 MG tablet Take 20 mg by mouth daily.   Yes [provider]  metoprolol tartrate (LOPRESSOR) 25 MG tablet Take 25 mg by mouth 2 (two) times daily. 01/28/17  Yes  [provider]  mirtazapine (REMERON) 15 MG tablet Take 15 mg by mouth at bedtime.   Yes [provider]  morphine (MS CONTIN) 15 MG 12 hr tablet Take 1 tablet (15 mg total) by mouth every 12 (twelve) hours. 05/23/17  Yes Auburn Bilberry, MD  morphine (MSIR) 15 MG tablet Take 15 mg by mouth every 4 (four) hours as needed for severe pain.   Yes [provider]  nitrofurantoin (MACRODANTIN) 100 MG capsule Take 100 mg by mouth daily.   Yes [provider]  nitroGLYCERIN (NITROSTAT) 0.4 MG SL tablet Place 0.4 mg under the tongue every 5 (five) minutes as needed for chest pain. Reported on 04/16/2016   Yes [provider]  omeprazole (PRILOSEC) 20 MG capsule Take 20 mg by mouth daily.   Yes [provider]  pregabalin (LYRICA) 150 MG capsule Take 1 capsule (150 mg total) by mouth 2 (two) times daily. Patient taking differently: Take 150 mg by mouth 3 (  three) times daily.  01/19/17  Yes Milagros Loll, MD  traZODone (DESYREL) 100 MG tablet Take 100 mg by mouth at bedtime. 01/28/17  Yes [provider]  venlafaxine XR (EFFEXOR-XR) 75 MG 24 hr capsule Take 75 mg by mouth 3 (three) times daily.    Yes [provider]  vitamin B-12 (CYANOCOBALAMIN) 1000 MCG tablet Take 1,000 mcg by mouth daily.   Yes [provider]  Vitamin D, Ergocalciferol, (DRISDOL) 50000 units CAPS capsule Take 50,000 Units by mouth every Sunday.    Yes [provider]  acetaminophen (TYLENOL) 500 MG tablet Take 1,000 mg by mouth every 4 (four) hours as needed for mild pain or headache.     [provider]  furosemide (LASIX) 20 MG tablet Take 1 tablet (20 mg total) by mouth daily as needed. Patient not taking: Reported on 07/06/2018 03/01/17   [name], MD  gentamicin cream (GARAMYCIN) 0.1 % Apply 1 application topically 3 (three) times daily. Patient not taking: Reported on 07/06/2018 06/17/18   Felecia Shelling, DPM  Multiple Vitamin  (MULTIVITAMIN WITH MINERALS) TABS tablet Take 1 tablet by mouth daily.    [provider]  polyethylene glycol powder (GLYCOLAX/MIRALAX) powder 17 grams as needed each time for constpation 06/24/17   Wyline Mood, MD    Allergies Codeine; Flu virus vaccine; Influenza vaccines; Methadone; Oxycodone-acetaminophen; Percocet [oxycodone-acetaminophen]; Tetanus antitoxin; Tetanus toxoids; Penicillin g; and Tetracyclines & related  Family History  Problem Relation Age of Onset  . CAD Mother   . CAD Father     Social History Social History   Tobacco Use  . Smoking status: Never Smoker  . Smokeless tobacco: Never Used  Substance Use Topics  . Alcohol use: No  . Drug use: No    Review of Systems  Level 5 caveat secondary to altered mentation. ____________________________________________   PHYSICAL EXAM:  VITAL SIGNS: ED Triage Vitals [07/06/18 2057]  Enc Vitals Group     BP      Pulse Rate 96     Resp 16     Temp      Temp src      SpO2      Weight 175 lb (79.4 kg)     Height 5\' 5"  (1.651 m)     Head Circumference      Peak Flow      Pain Score      Pain Loc      Pain Edu?      Excl. in GC?     Constitutional: Alert and oriented to name.  Patient responsive to pain.  Opens eyes to pain.  Confused but verbally responsive.  She is also being commands.  GCS of 12. Eyes: Conjunctivae are normal.  Head: Atraumatic. Nose: No congestion/rhinnorhea. Mouth/Throat: Mucous membranes are moist.  Neck: No stridor.   Cardiovascular: Normal rate, regular rhythm. Grossly normal heart sounds.  Respiratory: Normal respiratory effort.  No retractions. Lungs CTAB. Gastrointestinal: Soft and nontender. No distention. Musculoskeletal: Mild right lower extremity edema.  No erythema or induration. Neurologic: GCS of 12. Skin: See musculoskeletal above.   ____________________________________________   LABS (all labs ordered are listed, but only abnormal results are  displayed)  Labs Reviewed  CBC WITH DIFFERENTIAL/PLATELET - Abnormal; Notable for the following components:      Result Value   RBC 3.60 (*)    MCV 100.6 (*)    All other components within normal limits  BASIC METABOLIC PANEL - Abnormal; Notable for the following  components:   Glucose, Bld 104 (*)    BUN 34 (*)    Creatinine, Ser 1.26 (*)    Calcium 8.5 (*)    GFR calc non Af Amer 42 (*)    GFR calc Af Amer 48 (*)    All other components within normal limits  URINALYSIS, ROUTINE W REFLEX MICROSCOPIC - Abnormal; Notable for the following components:   Color, Urine YELLOW (*)    APPearance CLEAR (*)    All other components within normal limits  BLOOD GAS, ARTERIAL - Abnormal; Notable for the following components:   pH, Arterial 7.28 (*)    pCO2 arterial 51 (*)    pO2, Arterial 54 (*)    Acid-base deficit 3.2 (*)    All other components within normal limits  CULTURE, BLOOD (ROUTINE X 2)  CULTURE, BLOOD (ROUTINE X 2)  URINE CULTURE  GLUCOSE, CAPILLARY  ETHANOL  TROPONIN I  ACETAMINOPHEN LEVEL  SALICYLATE LEVEL  LACTIC ACID, PLASMA  LACTIC ACID, PLASMA  URINE DRUG SCREEN, QUALITATIVE ([facility] ONLY)   ____________________________________________  EKG  ED ECG REPORT I, Arelia Longest, the attending physician, personally viewed and interpreted this ECG.   Date: 07/06/2018  EKG Time: 2057  Rate: 61  Rhythm: normal sinus rhythm  Axis: Left axis  Intervals:none  ST&T Change: No ST segment elevation or depression.  No abnormal T wave inversion.  ____________________________________________  RADIOLOGY  CT head without acute abnormality.  Chest x-ray without active disease. ____________________________________________   PROCEDURES  Procedure(s) performed:   .Critical Care Performed by: Myrna Blazer, MD Authorized by: Myrna Blazer, MD   Critical care provider statement:    Critical care time (minutes):  35   Critical care time was  exclusive of:  Separately billable procedures and treating other patients   Critical care was necessary to treat or prevent imminent or life-threatening deterioration of the following conditions:  CNS failure or compromise   Critical care was time spent personally by me on the following activities:  Development of treatment plan with patient or surrogate, discussions with consultants, evaluation of patient's response to treatment, examination of patient, obtaining history from patient or surrogate, ordering and performing treatments and interventions, ordering and review of laboratory studies, ordering and review of radiographic studies, pulse oximetry, re-evaluation of patient's condition and review of old charts    Critical Care performed:    ____________________________________________   INITIAL IMPRESSION / ASSESSMENT AND PLAN / ED COURSE  Pertinent labs & imaging results that were available during my care of the patient were reviewed by me and considered in my medical decision making (see chart for details).  Differential diagnosis includes, but is not limited to, alcohol, illicit or prescription medications, or other toxic ingestion; intracranial pathology such as stroke or intracerebral hemorrhage; fever or infectious causes including sepsis; hypoxemia and/or hypercarbia; uremia; trauma; endocrine related disorders such as diabetes, hypoglycemia, and thyroid-related diseases; hypertensive encephalopathy; etc. As part of my medical decision making, I reviewed the following data within the electronic MEDICAL RECORD NUMBER Notes from prior ED visits   ----------------------------------------- 9:24 PM on 07/06/2018 -----------------------------------------  Patient given 0.4 mg of Narcan with some response.  She is now opening her eyes.  Scratching and sneezing.  Slowly more responsive verbally.  ----------------------------------------- 11:28 PM on  07/06/2018 -----------------------------------------  Patient at this time continues to be awake and alert when sternally rubbed.  She denies any overdoses.  Denies recent illnesses.  Says that she does not drink.  Reassuring  lab work.  Likely overly sedated secondary to multiple sedatives/opiates and her drug cocktail.  Will be admitted to the hospital.  Signed out to Dr. Caryn Bee.   ____________________________________________   FINAL CLINICAL IMPRESSION(S) / ED DIAGNOSES  Altered mental status.    NEW MEDICATIONS STARTED DURING THIS VISIT:  New Prescriptions   No medications on file     Note:  This document was prepared using Dragon voice recognition software and may include unintentional dictation errors.     Myrna Blazer, MD 07/06/18 2329

## 2018-07-06 NOTE — Progress Notes (Signed)
CODE SEPSIS - PHARMACY COMMUNICATION  **Broad Spectrum Antibiotics should be administered within 1 hour of Sepsis diagnosis**  Time Code Sepsis Called/Page Received: 2108  Antibiotics Ordered: vanc/levaquin/aztreonam  Time of 1st antibiotic administration: 2141  Additional action taken by pharmacy:   If necessary, Name of Provider/Nurse Contacted:     Jarrah Babich ,PharmD Clinical Pharmacist  07/06/2018  10:10 PM  

## 2018-07-07 LAB — BASIC METABOLIC PANEL
Anion gap: 5 (ref 5–15)
BUN: 26 mg/dL — AB (ref 8–23)
CALCIUM: 8.4 mg/dL — AB (ref 8.9–10.3)
CHLORIDE: 112 mmol/L — AB (ref 98–111)
CO2: 27 mmol/L (ref 22–32)
CREATININE: 0.83 mg/dL (ref 0.44–1.00)
GFR calc non Af Amer: >60 mL/min (ref >60)
GLUCOSE: 152 mg/dL — AB (ref 70–99)
Potassium: 4.7 mmol/L (ref 3.5–5.1)
Sodium: 144 mmol/L (ref 135–145)

## 2018-07-07 LAB — CBC
HCT: 36.9 % (ref 35.0–47.0)
Hemoglobin: 12.4 g/dL (ref 12.0–16.0)
MCH: 33.8 pg (ref 26.0–34.0)
MCHC: 33.6 g/dL (ref 32.0–36.0)
MCV: 100.5 fL — AB (ref 80.0–100.0)
PLATELETS: 174 10*3/uL (ref 150–440)
RBC: 3.67 MIL/uL — AB (ref 3.80–5.20)
RDW: 13.2 % (ref 11.5–14.5)
WBC: 6.3 10*3/uL (ref 3.6–11.0)

## 2018-07-07 LAB — URINE DRUG SCREEN, QUALITATIVE (ARMC ONLY)
AMPHETAMINES, UR SCREEN: NOT DETECTED
BENZODIAZEPINE, UR SCRN: POSITIVE — AB
Cannabinoid 50 Ng, Ur ~~LOC~~: NOT DETECTED
Cocaine Metabolite,Ur ~~LOC~~: NOT DETECTED
MDMA (ECSTASY) UR SCREEN: NOT DETECTED
METHADONE SCREEN, URINE: NOT DETECTED
Opiate, Ur Screen: POSITIVE — AB
Phencyclidine (PCP) Ur S: NOT DETECTED
TRICYCLIC, UR SCREEN: POSITIVE — AB

## 2018-07-07 LAB — GLUCOSE, CAPILLARY
Glucose-Capillary: 98 mg/dL (ref 70–99)
Glucose-Capillary: 179 mg/dL — ABNORMAL HIGH (ref 70–99)

## 2018-07-07 LAB — LACTIC ACID, PLASMA
LACTIC ACID, VENOUS: 1.3 mmol/L (ref 0.5–1.9)
LACTIC ACID, VENOUS: 1.6 mmol/L (ref 0.5–1.9)

## 2018-07-07 MED ORDER — ONDANSETRON HCL 4 MG PO TABS: 4.0000 mg | ORAL_TABLET | Freq: Four times a day (QID) | ORAL | Status: DC | PRN

## 2018-07-07 MED ORDER — MORPHINE SULFATE 15 MG PO TABS
15.0000 mg | ORAL_TABLET | Freq: Three times a day (TID) | ORAL | Status: DC
  Filled 2018-07-07: qty 1

## 2018-07-07 MED ORDER — VITAMIN D (ERGOCALCIFEROL) 1.25 MG (50000 UNIT) PO CAPS: 50000.0000 [IU] | ORAL_CAPSULE | ORAL | Status: DC

## 2018-07-07 MED ORDER — ATORVASTATIN CALCIUM 20 MG PO TABS: 20.0000 mg | ORAL_TABLET | Freq: Every day | ORAL | Status: DC

## 2018-07-07 MED ORDER — FERROUS SULFATE 325 (65 FE) MG PO TABS
325.0000 mg | ORAL_TABLET | Freq: Every day | ORAL | Status: DC
  Administered 2018-07-07: 325 mg via ORAL
  Filled 2018-07-07: qty 1

## 2018-07-07 MED ORDER — SODIUM CHLORIDE 0.9 % IV SOLN
INTRAVENOUS | Status: DC
  Administered 2018-07-07: 02:00:00 via INTRAVENOUS

## 2018-07-07 MED ORDER — VITAMIN B-12 1000 MCG PO TABS
1000.0000 ug | ORAL_TABLET | Freq: Every day | ORAL | Status: DC
  Administered 2018-07-07: 1000 ug via ORAL
  Filled 2018-07-07: qty 1

## 2018-07-07 MED ORDER — INSULIN ASPART 100 UNIT/ML ~~LOC~~ SOLN
0.0000 [IU] | Freq: Three times a day (TID) | SUBCUTANEOUS | Status: DC
  Administered 2018-07-07: 2 [IU] via SUBCUTANEOUS
  Filled 2018-07-07: qty 1

## 2018-07-07 MED ORDER — METOPROLOL TARTRATE 25 MG PO TABS
25.0000 mg | ORAL_TABLET | Freq: Two times a day (BID) | ORAL | Status: DC
  Administered 2018-07-07: 25 mg via ORAL
  Filled 2018-07-07: qty 1

## 2018-07-07 MED ORDER — TRAZODONE HCL 50 MG PO TABS: 25.0000 mg | ORAL_TABLET | Freq: Every evening | ORAL | Status: DC | PRN

## 2018-07-07 MED ORDER — ADULT MULTIVITAMIN W/MINERALS CH
1.0000 | ORAL_TABLET | Freq: Every day | ORAL | Status: DC
  Administered 2018-07-07: 1 via ORAL
  Filled 2018-07-07: qty 1

## 2018-07-07 MED ORDER — BISACODYL 5 MG PO TBEC: 5.0000 mg | DELAYED_RELEASE_TABLET | Freq: Every day | ORAL | Status: DC | PRN

## 2018-07-07 MED ORDER — PANTOPRAZOLE SODIUM 40 MG PO TBEC
40.0000 mg | DELAYED_RELEASE_TABLET | Freq: Every day | ORAL | Status: DC
  Administered 2018-07-07: 40 mg via ORAL
  Filled 2018-07-07: qty 1

## 2018-07-07 MED ORDER — DOCUSATE SODIUM 100 MG PO CAPS
100.0000 mg | ORAL_CAPSULE | Freq: Two times a day (BID) | ORAL | Status: DC
  Administered 2018-07-07: 100 mg via ORAL
  Filled 2018-07-07: qty 1

## 2018-07-07 MED ORDER — HEPARIN SODIUM (PORCINE) 5000 UNIT/ML IJ SOLN
5000.0000 [IU] | Freq: Three times a day (TID) | INTRAMUSCULAR | Status: DC
  Administered 2018-07-07 (×2): 5000 [IU] via SUBCUTANEOUS
  Filled 2018-07-07 (×2): qty 1

## 2018-07-07 MED ORDER — VENLAFAXINE HCL ER 75 MG PO CP24
75.0000 mg | ORAL_CAPSULE | Freq: Three times a day (TID) | ORAL | Status: DC
  Administered 2018-07-07: 75 mg via ORAL
  Filled 2018-07-07 (×3): qty 1

## 2018-07-07 MED ORDER — INSULIN GLARGINE 300 UNIT/ML ~~LOC~~ SOPN: 20.0000 [IU] | PEN_INJECTOR | Freq: Every day | SUBCUTANEOUS | 0 refills | Status: DC

## 2018-07-07 MED ORDER — ONDANSETRON HCL 4 MG/2ML IJ SOLN
4.0000 mg | Freq: Four times a day (QID) | INTRAMUSCULAR | Status: DC | PRN
Start: 2018-07-07 — End: 2018-07-07

## 2018-07-07 MED ORDER — ACETAMINOPHEN 325 MG PO TABS
650.0000 mg | ORAL_TABLET | Freq: Four times a day (QID) | ORAL | Status: DC | PRN
  Administered 2018-07-07: 650 mg via ORAL
  Filled 2018-07-07: qty 2

## 2018-07-07 MED ORDER — BIOTIN 5 MG PO CAPS: 5.0000 mg | ORAL_CAPSULE | Freq: Every day | ORAL | Status: DC

## 2018-07-07 MED ORDER — INSULIN ASPART 100 UNIT/ML ~~LOC~~ SOLN: 0.0000 [IU] | Freq: Every day | SUBCUTANEOUS | Status: DC

## 2018-07-07 MED ORDER — MORPHINE SULFATE ER 15 MG PO TBCR
15.0000 mg | EXTENDED_RELEASE_TABLET | Freq: Two times a day (BID) | ORAL | Status: DC
  Administered 2018-07-07: 15 mg via ORAL
  Filled 2018-07-07: qty 1

## 2018-07-07 NOTE — Plan of Care (Signed)
  Problem: Education: Goal: Knowledge of General Education information will improve Description Including pain rating scale, medication(s)/side effects and non-pharmacologic comfort measures 07/07/2018 0409 by Roxana HiresPage, Twanisha Foulk Scott, RN Outcome: Progressing 07/07/2018 0409 by Kahli Fitzgerald, Genelle GatherMatthew Scott, RN Outcome: Progressing   Problem: Health Behavior/Discharge Planning: Goal: Ability to manage health-related needs will improve 07/07/2018 0409 by Donne Baley, Genelle GatherMatthew Scott, RN Outcome: Progressing 07/07/2018 0409 by Roxana HiresPage, Ayano Douthitt Scott, RN Outcome: Progressing   Problem: Clinical Measurements: Goal: Ability to maintain clinical measurements within normal limits will improve 07/07/2018 0409 by Erlean Mealor, Genelle GatherMatthew Scott, RN Outcome: Progressing 07/07/2018 0409 by Roxana HiresPage, Rector Devonshire Scott, RN Outcome: Progressing   Problem: Clinical Measurements: Goal: Ability to maintain clinical measurements within normal limits will improve 07/07/2018 0409 by Estreya Clay, Genelle GatherMatthew Scott, RN Outcome: Progressing 07/07/2018 0409 by Roxana HiresPage, Landen Knoedler Scott, RN Outcome: Progressing Goal: Will remain free from infection 07/07/2018 0409 by Roxana HiresPage, [place] Silveria Scott, RN Outcome: Progressing 07/07/2018 0409 by Roxana HiresPage, Rennie Rouch Scott, RN Outcome: Progressing Goal: Diagnostic test results will improve 07/07/2018 0409 by Geovana Gebel, Genelle GatherMatthew Scott, RN Outcome: Progressing 07/07/2018 0409 by Roxana HiresPage, Skylen Spiering Scott, RN Outcome: Progressing Goal: Respiratory complications will improve 07/07/2018 0409 by Roxana HiresPage, Genavieve Mangiapane Scott, RN Outcome: Progressing 07/07/2018 0409 by Roxana HiresPage, Susa Bones Scott, RN Outcome: Progressing Goal: Cardiovascular complication will be avoided 07/07/2018 0409 by Roxana HiresPage, Petro Talent Scott, RN Outcome: Progressing 07/07/2018 0409 by Roxana HiresPage, Emmalou Hunger Scott, RN Outcome: Progressing

## 2018-07-07 NOTE — Progress Notes (Signed)
Pt ready for d/c home this afternoon per MD. Pt was weaned to room air this morning and has had oxygen saturations ranging mid to high 90s. Pt ambulated about 50 feet using rolling walker with RN; she tolerated well. Pt has rollator at home. Discharge instructions and changes to medications reviewed with pt, all questions answered. PIV removed, VSS. Pt waiting for ride.   [place], Latricia HeftKorie G

## 2018-07-07 NOTE — Plan of Care (Signed)

## 2018-07-07 NOTE — Discharge Summary (Signed)
[facility] [place] [facility]   PATIENT NAME: Jessica DanceBarbara Donovan    MR#:  [id number]  DATE OF BIRTH:  06-13-1946  DATE OF ADMISSION:  07/06/2018 ADMITTING PHYSICIAN: Cammy CopaAngela Maier, MD  DATE OF DISCHARGE: 07/07/2018  PRIMARY CARE PHYSICIAN: Jessica Donovan, Robert, MD    ADMISSION DIAGNOSIS:  Altered mental status, unspecified altered mental status type [R41.82]  DISCHARGE DIAGNOSIS:  acute encephalopathy suspected due to dehydration and home pain medications  SECONDARY DIAGNOSIS:   Past Medical History:  Diagnosis Date  . Anxiety   . Chronic back pain   . Collagen vascular disease (HCC)   . Coronary artery disease    a. s/p PCI/DES to dRCA & mLCx in 2014 with repeat LHC in 10/2015 showing patent stents  . Diabetes mellitus without complication (HCC)   . Hypertension   . Low back pain   . Myocardial infarction (HCC)   . Osteomyelitis of toe (HCC) 01/23/2016  . Stroke [facility](HCC)     HOSPITAL COURSE:  Jessica DanceBarbara Mccuen  is a 72 y.o. female with a known history of chronic back pain, CAD, diabetes type 2. Patient is unable to provide history due to altered mental status.  Most information was taken from reviewing the medical chart and from discussion with emergency room physician. Apparently, patient was found unresponsive by her neighbor, who walks her dog.  Per EMS report patient was responding to painful stimuli only; her oxygen saturation was 84% on room air.  Patient became slightly more alert after Narcan given by paramedics.  1.  Acute encephalopathy, likely related to overuse of medications with sedative effect, like pain medications, muscle relaxants and antianxiety pills.  We will continue to monitor clinically closely. -  Continue supportive therapy with IV fluids and oxygen.  Avoid sedatives. -Patient's mentation is back to normal. She is doing much better. -She reportedly was out in the hot weather on Tuesday and felt very exhausted. Yesterday she cleaned  her whole house and did a lot of work and was exhausted as well. -She denies over using her pain meds  2.  Acute respiratory failure, secondary to medication overdose.   -improving now. Will try to wean her off oxygen.  3. DM2.   -eating well. Will resume sliding scale insulin and home does insulin has been decreased to 20 units since sugars are well controlled   4.  Chronic pain.   we did get her pain medication verified [facility]  -primary care physician Dr. Emilia BeckMcConville gives her the pain meds.  5.  Acute renal failure, likely prerenal.   -received gentle IV hydration and monitor kidney function closely.  Avoid nephrotoxic medications. -Creatinine back to baseline  Patient will discharged to home. She uses a walker to walk around. I have asked the RN to walker. Spoke with  Ellan LambertAnn Donovan friend who will come and pick her up.   CONSULTS OBTAINED:    DRUG ALLERGIES:   Allergies  Allergen Reactions  . Codeine Nausea And Vomiting  . Flu Virus Vaccine     Other reaction(s): Other (See Comments) Passed out for 12 days  . Influenza Vaccines Other (See Comments)    Reaction:  Caused pt to pass out   . Methadone Hives and Itching  . Oxycodone-Acetaminophen Nausea And Vomiting  . Percocet [Oxycodone-Acetaminophen] Nausea And Vomiting  . Tetanus Antitoxin   . Tetanus Toxoids Swelling and Other (See Comments)    Reaction:  Swelling at injection site  . Penicillin G  Rash  . Tetracyclines & Related Rash    DISCHARGE MEDICATIONS:   Allergies as of 07/07/2018      Reactions   Codeine Nausea And Vomiting   Flu Virus Vaccine    Other reaction(s): Other (See Comments) Passed out for 12 days   Influenza Vaccines Other (See Comments)   Reaction:  Caused pt to pass out    Methadone Hives, Itching   Oxycodone-acetaminophen Nausea And Vomiting   Percocet [oxycodone-acetaminophen] Nausea And Vomiting   Tetanus Antitoxin    Tetanus Toxoids Swelling, Other (See Comments)    Reaction:  Swelling at injection site   Penicillin G Rash   Tetracyclines & Related Rash      Medication List    STOP taking these medications   furosemide 20 MG tablet Commonly known as:  LASIX   gentamicin cream 0.1 % Commonly known as:  GARAMYCIN     TAKE these medications   acetaminophen 500 MG tablet Commonly known as:  TYLENOL Take 1,000 mg by mouth every 4 (four) hours as needed for mild pain or headache.   ALPRAZolam 1 MG tablet Commonly known as:  XANAX Take 1 mg by mouth 3 (three) times daily.   aspirin EC 81 MG tablet Take 81 mg by mouth daily.   atorvastatin 20 MG tablet Commonly known as:  LIPITOR Take 20 mg by mouth at bedtime.   Biotin 5 MG Caps Take 5 mg by mouth daily.   celecoxib 200 MG capsule Commonly known as:  CELEBREX Take 200 mg by mouth daily.   cyclobenzaprine 10 MG tablet Commonly known as:  FLEXERIL Take 1 tablet (10 mg total) by mouth 3 (three) times daily as needed for muscle spasms.   diphenoxylate-atropine 2.5-0.025 MG tablet Commonly known as:  LOMOTIL Take 1 tablet by mouth every 4 (four) hours as needed for diarrhea or loose stools.   ferrous sulfate 325 (65 FE) MG tablet Take 325 mg by mouth daily with breakfast.   insulin aspart 100 UNIT/ML injection Commonly known as:  novoLOG Inject 3-15 Units into the skin 3 (three) times daily with meals as needed for high blood sugar. Pt uses as needed per sliding scale:    Less than 140:  0 units  140-180:  3 units 181-220:  4 units 221- 260:  6 units 261- 320:  8 units 321-360:  10 units 361-400:  12 units Greater than 400:  15 units   Insulin Glargine 300 UNIT/ML Sopn Commonly known as:  TOUJEO SOLOSTAR Inject 20 Units into the skin daily. What changed:  how much to take   isosorbide mononitrate 30 MG 24 hr tablet Commonly known as:  IMDUR Take 30 mg by mouth daily.   lisinopril 20 MG tablet Commonly known as:  PRINIVIL,ZESTRIL Take 20 mg by mouth daily.    metoprolol tartrate 25 MG tablet Commonly known as:  LOPRESSOR Take 25 mg by mouth 2 (two) times daily.   mirtazapine 15 MG tablet Commonly known as:  REMERON Take 15 mg by mouth at bedtime.   morphine 15 MG tablet Commonly known as:  MSIR Take 15 mg by mouth every 4 (four) hours as needed for severe pain.   morphine 15 MG 12 hr tablet Commonly known as:  MS CONTIN Take 1 tablet (15 mg total) by mouth every 12 (twelve) hours.   multivitamin with minerals Tabs tablet Take 1 tablet by mouth daily.   nitrofurantoin 100 MG capsule Commonly known as:  MACRODANTIN Take 100 mg by  mouth daily.   nitroGLYCERIN 0.4 MG SL tablet Commonly known as:  NITROSTAT Place 0.4 mg under the tongue every 5 (five) minutes as needed for chest pain. Reported on 04/16/2016   omeprazole 20 MG capsule Commonly known as:  PRILOSEC Take 20 mg by mouth daily.   polyethylene glycol powder powder Commonly known as:  GLYCOLAX/MIRALAX 17 grams as needed each time for constpation   pregabalin 150 MG capsule Commonly known as:  LYRICA Take 1 capsule (150 mg total) by mouth 2 (two) times daily. What changed:  when to take this   traZODone 100 MG tablet Commonly known as:  DESYREL Take 100 mg by mouth at bedtime.   venlafaxine XR 75 MG 24 hr capsule Commonly known as:  EFFEXOR-XR Take 75 mg by mouth 3 (three) times daily.   vitamin B-12 1000 MCG tablet Commonly known as:  CYANOCOBALAMIN Take 1,000 mcg by mouth daily.   Vitamin D (Ergocalciferol) 50000 units Caps capsule Commonly known as:  DRISDOL Take 50,000 Units by mouth every Sunday.       If you experience worsening of your admission symptoms, develop shortness of breath, life threatening emergency, suicidal or homicidal thoughts you must seek medical attention immediately by calling 911 or calling your MD immediately  if symptoms less severe.  You Must read complete instructions/literature along with all the possible adverse  reactions/side effects for all the Medicines you take and that have been prescribed to you. Take any new Medicines after you have completely understood and accept all the possible adverse reactions/side effects.   Please note  You were cared for by a hospitalist during your hospital stay. If you have any questions about your discharge medications or the care you received while you were in the hospital after you are discharged, you can call the unit and asked to speak with the hospitalist on call if the hospitalist that took care of you is not available. Once you are discharged, your primary care physician will handle any further medical issues. Please note that NO REFILLS for any discharge medications will be authorized once you are discharged, as it is imperative that you return to your primary care physician (or establish a relationship with a primary care physician if you do not have one) for your aftercare needs so that they can reassess your need for medications and monitor your lab values. Today   SUBJECTIVE    Doing well VITAL SIGNS:  Blood pressure (!) 157/60, pulse 78, temperature 98.2 F (36.8 C), temperature source Oral, resp. rate 19, height 5\' 5"  (1.651 m), weight 84 kg (185 lb 3 oz), SpO2 95 %.  I/O:    Intake/Output Summary (Last 24 hours) at 07/07/2018 1234 Last data filed at 07/07/2018 1157 Gross per 24 hour  Intake 885 ml  Output 800 ml  Net 85 ml    PHYSICAL EXAMINATION:  GENERAL:  72 y.o.-year-old patient lying in the bed with no acute distress. obese EYES: Pupils equal, round, reactive to light and accommodation. No scleral icterus. Extraocular muscles intact.  HEENT: Head atraumatic, normocephalic. Oropharynx and nasopharynx clear.  NECK:  Supple, no jugular venous distention. No thyroid enlargement, no tenderness.  LUNGS: Normal breath sounds bilaterally, no wheezing, rales,rhonchi or crepitation. No use of accessory muscles of respiration.  CARDIOVASCULAR: S1, S2  normal. No murmurs, rubs, or gallops.  ABDOMEN: Soft, non-tender, non-distended. Bowel sounds present. No organomegaly or mass.  EXTREMITIES: No pedal edema, cyanosis, or clubbing.  NEUROLOGIC: Cranial nerves II through XII are  intact. Muscle strength 5/5 in all extremities. Sensation intact. Gait not checked.  PSYCHIATRIC: The patient is alert and oriented x 3.  SKIN: No obvious rash, lesion, or ulcer.   DATA REVIEW:   CBC  Recent Labs  Lab 07/07/18 0334  WBC 6.3  HGB 12.4  HCT 36.9  PLT 174    Chemistries  Recent Labs  Lab 07/07/18 0334  NA 144  K 4.7  CL 112*  CO2 27  GLUCOSE 152*  BUN 26*  CREATININE 0.83  CALCIUM 8.4*    Microbiology Results   Recent Results (from the past 240 hour(s))  Blood Culture (routine x 2)     Status: None (Preliminary result)   Collection Time: 07/06/18  9:01 PM  Result Value Ref Range Status   Specimen Description BLOOD RIGHT ANTECUBITAL  Final   Special Requests Blood Culture adequate volume  Final   Culture   Final    NO GROWTH < 12 HOURS Performed at [facility], [address], [place], [place] [address]    Report Status PENDING  Incomplete  Blood Culture (routine x 2)     Status: None (Preliminary result)   Collection Time: 07/06/18  9:30 PM  Result Value Ref Range Status   Specimen Description BLOOD BLOOD LEFT FOREARM  Final   Special Requests Blood Culture adequate volume  Final   Culture   Final    NO GROWTH < 12 HOURS Performed at [facility], [address] [place], [place] [address]    Report Status PENDING  Incomplete    RADIOLOGY:  Dg Chest 1 View  Result Date: 07/06/2018 CLINICAL DATA:  72 year old female with shortness of breath. EXAM: CHEST  1 VIEW COMPARISON:  Chest radiograph dated 06/09/2017 FINDINGS: There is shallow inspiration with mild chronic bronchitic changes. No focal consolidation, pleural effusion, or pneumothorax. The cardiac silhouette is within normal limits. No acute  osseous pathology. IMPRESSION: No active disease. Electronically Signed   By: Elgie Collard M.D.   On: 07/06/2018 21:31   Ct Head Wo Contrast  Result Date: 07/06/2018 CLINICAL DATA:  Encephalopathy EXAM: CT HEAD WITHOUT CONTRAST TECHNIQUE: Contiguous axial images were obtained from the base of the skull through the vertex without intravenous contrast. COMPARISON:  Head CT 01/03/2018 FINDINGS: Brain: There is no mass, hemorrhage or extra-axial collection. The size and configuration of the ventricles and extra-axial CSF spaces are normal. There is no acute or chronic infarction. There is hypoattenuation of the periventricular white matter, most commonly indicating chronic ischemic microangiopathy. Vascular: No abnormal hyperdensity of the major intracranial arteries or dural venous sinuses. No intracranial atherosclerosis. Skull: The visualized skull base, calvarium and extracranial soft tissues are normal. Sinuses/Orbits: No fluid levels or advanced mucosal thickening of the visualized paranasal sinuses. No mastoid or middle ear effusion. The orbits are normal. IMPRESSION: No acute abnormality.  Findings of chronic small vessel disease. Electronically Signed   By: Deatra Robinson M.D.   On: 07/06/2018 22:57     Management plans discussed with the patient, family and they are in agreement.  CODE STATUS:     Code Status Orders  (From admission, onward)        Start     Ordered   07/07/18 0221  Do not attempt resuscitation (DNR)  Continuous    Question Answer Comment  In the event of cardiac or respiratory ARREST Do not call a "code blue"   In the event of cardiac or respiratory ARREST Do not perform Intubation,  CPR, defibrillation or ACLS   In the event of cardiac or respiratory ARREST Use medication by any route, position, wound care, and other measures to relive pain and suffering. May use oxygen, suction and manual treatment of airway obstruction as needed for comfort.      07/07/18 0220     Code Status History    Date Active Date Inactive Code Status Order ID Comments User Context   06/10/2017 1446 06/10/2017 2022 DNR [id number]  Delfino Lovett, MD Inpatient   06/09/2017 1743 06/10/2017 1446 Full Code [id number]  Enid Baas, MD Inpatient   05/19/2017 2144 05/23/2017 1719 DNR [id number]  Oralia Manis, MD Inpatient   02/23/2017 1640 03/01/2017 1902 DNR [id number]  Altamese Dilling, MD Inpatient   01/17/2017 0407 01/19/2017 2010 DNR [id number]  Arnaldo Natal, MD Inpatient   04/09/2016 0311 04/10/2016 2044 DNR [id number]  Arnaldo Natal, MD Inpatient   04/09/2016 0205 04/09/2016 0311 Full Code [id number]  Oralia Manis, MD Inpatient   04/03/2016 1853 04/04/2016 1817 Full Code [id number]  Houston Siren, MD Inpatient   02/23/2016 2252 02/25/2016 1637 DNR [id number]  Robley Fries, MD Inpatient   01/24/2016 1554 01/27/2016 1906 DNR [id number]  Milagros Loll, MD Inpatient   01/23/2016 1607 01/23/2016 1622 Full Code [id number]  Shaune Pollack, MD Inpatient   12/07/2015 2223 12/09/2015 1858 Full Code [id number]  Auburn Bilberry, MD Inpatient   11/29/2015 1741 12/01/2015 1833 DNR [id number]  Shaune Pollack, MD Inpatient   11/10/2015 0954 11/12/2015 2139 DNR [id number]  Linus Galas, MD Inpatient   11/07/2015 1732 11/10/2015 0954 DNR [id number]  Altamese Dilling, MD Inpatient   11/07/2015 1549 11/07/2015 1732 Full Code [id number]  Altamese Dilling, MD Inpatient    Advance Directive Documentation     Most Recent Value  Type of Advance Directive  Healthcare Power of Attorney, Living will  Pre-existing out of facility DNR order (yellow form or pink MOST form)  -  "MOST" Form in Place?  -      TOTAL TIME TAKING CARE OF THIS PATIENT: *40* minutes.    [name] M.D on 07/07/2018 at 12:34 PM  Between 7am to 6pm - Pager - [phone] After 6pm go to [email or web address] - Social research officer, government  [facility]  Office  [phone]  CC: Primary care physician; Jessica Hatchet, MD

## 2018-07-08 LAB — URINE CULTURE: Culture: NO GROWTH

## 2018-07-11 LAB — CULTURE, BLOOD (ROUTINE X 2)
Culture: NO GROWTH
Culture: NO GROWTH
SPECIAL REQUESTS: ADEQUATE
SPECIAL REQUESTS: ADEQUATE

## 2018-07-13 ENCOUNTER — Other Ambulatory Visit: Payer: Self-pay | Admitting: Pharmacist

## 2018-07-13 NOTE — Patient Outreach (Signed)
[facility] Care Management  07/13/2018  [name] 11-11-1946 [id number]  Patient is a 72 year old female contacted for 30-day post-discharge medication reconciliation. PMHx includes but not limited to type 2 diabetes, coronary artery disease (s/p DES to dRCA and mLCx in 2014), hypertension, lower back pack, and anxiety.   Contacted patient this morning, she requested that I call her back after 3 pm today. When I called back, she did not answer. Left HIPAA compliant voice-mail with my call back information.   Plan:  Will follow up in 2-5 business days.   [name], PharmD PGY2 Ambulatory Care Pharmacy Resident Phone: [phone]

## 2018-07-15 ENCOUNTER — Encounter: Payer: PPO | Admitting: Podiatry

## 2018-07-17 NOTE — Progress Notes (Signed)
This encounter was created in error - please disregard.

## 2018-07-19 NOTE — Patient Outreach (Signed)
[facility]) Care Management  07/19/2018   Annaleigha Woo 1946/07/22 [id number]  Subjective:   Patient is a 72 year old female contacted for 30-day post-discharge medication reconciliation. PMHx includes but not limited to type 2 diabetes, coronary artery disease (s/p DES to dRCA and mLCx in 2014), hypertension, lower back pack, and anxiety.   Patient states that she is doing well since discharge, but is having concerns with affording many of her prescriptions. She notes that she receives Lyrica and Gala Murdoch from Patient Assistance Programs, and receives samples from Dr. Myrna Blazer office of insulin, otherwise she would not be able to afford it. She estimates that she spent "about $200" at the pharmacy last month.   Objective:   Current Medications:  Current Outpatient Medications  Medication Sig Dispense Refill  . acetaminophen (TYLENOL) 500 MG tablet Take 1,000 mg by mouth every 4 (four) hours as needed for mild pain or headache.     . ALPRAZolam (XANAX) 1 MG tablet Take 1 mg by mouth 3 (three) times daily.     [name] aspirin EC 81 MG tablet Take 81 mg by mouth daily.    [name] atorvastatin (LIPITOR) 20 MG tablet Take 20 mg by mouth at bedtime.     . Biotin 5 MG CAPS Take 5 mg by mouth daily.     . celecoxib (CELEBREX) 200 MG capsule Take 200 mg by mouth daily.     . cyclobenzaprine (FLEXERIL) 10 MG tablet Take 1 tablet (10 mg total) by mouth 3 (three) times daily as needed for muscle spasms. 20 tablet 0  . ferrous sulfate 325 (65 FE) MG tablet Take 325 mg by mouth daily with breakfast.    . fesoterodine (TOVIAZ) 8 MG TB24 tablet Take 8 mg by mouth daily.    . insulin aspart (NOVOLOG) 100 UNIT/ML injection Inject 3-15 Units into the skin 3 (three) times daily with meals as needed for high blood sugar. Pt uses as needed per sliding scale:    Less than 140:  0 units  140-180:  3 units 181-220:  4 units 221- 260:  6 units 261- 320:  8 units 321-360:  10 units 361-400:  12  units Greater than 400:  15 units    . Insulin Glargine (TOUJEO SOLOSTAR) 300 UNIT/ML SOPN Inject 20 Units into the skin daily. 1 pen 0  . isosorbide mononitrate (IMDUR) 30 MG 24 hr tablet Take 30 mg by mouth daily.     [name] lisinopril (PRINIVIL,ZESTRIL) 20 MG tablet Take 20 mg by mouth daily.    . metoprolol tartrate (LOPRESSOR) 25 MG tablet Take 25 mg by mouth 2 (two) times daily.    . mirtazapine (REMERON) 15 MG tablet Take 15 mg by mouth at bedtime.    [name] morphine (MS CONTIN) 15 MG 12 hr tablet Take 1 tablet (15 mg total) by mouth every 12 (twelve) hours. 30 tablet 0  . morphine (MSIR) 15 MG tablet Take 15 mg by mouth every 4 (four) hours as needed for severe pain.    . Multiple Vitamin (MULTIVITAMIN WITH MINERALS) TABS tablet Take 1 tablet by mouth daily.    . nitrofurantoin (MACRODANTIN) 100 MG capsule Take 100 mg by mouth daily.    . nitroGLYCERIN (NITROSTAT) 0.4 MG SL tablet Place 0.4 mg under the tongue every 5 (five) minutes as needed for chest pain. Reported on 04/16/2016    . omeprazole (PRILOSEC) 20 MG capsule Take 20 mg by mouth daily.    . pregabalin (LYRICA) 150  MG capsule Take 1 capsule (150 mg total) by mouth 2 (two) times daily. (Patient taking differently: Take 150 mg by mouth 3 (three) times daily. )    . traZODone (DESYREL) 100 MG tablet Take 100 mg by mouth at bedtime.    [name] venlafaxine XR (EFFEXOR-XR) 75 MG 24 hr capsule Take 75 mg by mouth 3 (three) times daily.     . vitamin B-12 (CYANOCOBALAMIN) 1000 MCG tablet Take 1,000 mcg by mouth daily.    . Vitamin D, Ergocalciferol, (DRISDOL) 50000 units CAPS capsule Take 50,000 Units by mouth every Sunday.     . diphenoxylate-atropine (LOMOTIL) 2.5-0.025 MG tablet Take 1 tablet by mouth every 4 (four) hours as needed for diarrhea or loose stools.     . polyethylene glycol powder (GLYCOLAX/MIRALAX) powder 17 grams as needed each time for constpation (Patient not taking: Reported on 07/19/2018) 255 g 3   No current facility-administered  medications for this visit.     Functional Status:  In your present state of health, do you have any difficulty performing the following activities: 07/07/2018  Hearing? N  Vision? N  Difficulty concentrating or making decisions? Y  Walking or climbing stairs? Y  Dressing or bathing? Y  Doing errands, shopping? N  Some recent data might be hidden    Fall/Depression Screening: Fall Risk  06/28/2017 06/21/2017 03/26/2017  Falls in the past year? (No Data) Yes Yes  Comment Patient denies new falls - -  Number falls in past yr: - 2 or more 2 or more  Injury with Fall? - Yes Yes  Risk Factor Category  - High Fall Risk High Fall Risk  Risk for fall due to : - Impaired balance/gait;Impaired mobility Impaired balance/gait;Impaired mobility  Follow up - Falls prevention discussed Falls prevention discussed   PHQ 2/9 Scores 06/21/2017 03/26/2017 03/09/2017 03/09/2016 02/21/2016  PHQ - 2 Score 1 2 0 2 1  PHQ- 9 Score - 5 - 4 6    Assessment:   ASSESSMENT: Date Discharged from Hospital: 07/07/2018 Date Medication Reconciliation Performed: 07/19/2018  Medications Discontinued at Discharge: (Delete if not applicable)  furosemide  Gentamycin cream  Changed Medications at Discharge:   Toujeo - decreased to 20 units daily  Patient was recently discharged from hospital and all medications have been reviewed  Drugs sorted by system:  Neurologic/Psychologic: Alprazolam 1 mg TID, mirtazapine 15 mg daily, trazodone 100 mg daily, venlafaxine 75 mg daily  Cardiovascular: Aspirin 81 mg daily, atorvastatin 20 mg daily, Imdur 30 mg daily, lisinopril 20 mg daily, metoprolol tartrate 25 mg BID, SL nitroglycerin PRN,   Gastrointestinal: Lomotil PRN, omeprazole 20 mg daily, Miralax PRN  Endocrine: Toujeo 20 units daily, Novolog 3-15 units daily per sliding scale  Pain: Celecoxib 200 mg daily, cyclobenzaprine 10 mg TID, morphine ER 15 mg BID and morphine IR 15 mg three times daily PRN; Lyrica 150 mg TID    Vitamins/Minerals: Biotin 5 mg daily, ferrous sulfate 325 mg daily, multivitamin, Vitamin B12 1000 mcg daily, Vitamin D 50,000 units weekly  Infectious Diseases: nitrofurantoin 100 mg daily  Miscellaneous: Toviaz 8 mg daily  Medications to avoid in the elderly: alprazolam, mirtazapine, sliding scale insulin, cyclobenzaprine, combination of opioids and benzodiazepines and Lyrica, nitrofurantoin Drug interactions:  - CNS Depressing Agents: Alprazolam, mitrazapine, trazodone, venlafaxine, cyclobenzaprine, morphine, lomotil; continue to monitor for increased sedation, especially given patient's recent admission for altered mental status  Medication Management: -Patient notes concerns with affordability. She does not meet income criteria for Medicare Low Income  Subsidy. She does meet financial criteria for Patient Assistance for [employer] and Novolog  PLAN: - Will mail patient portion of applications for [employer] (Toujeo) and [employer](Novolog) patient assistance programs to patient - Will route note to PCP Dr. Emilia BeckMcConville for information. Will fax provider portion of applications to his office.  - Once EOB and OOP spend and patient/provider portions received, I will submit applications.     [name], PharmD PGY2 Ambulatory Care Pharmacy Resident Phone: [phone]

## 2018-07-25 DIAGNOSIS — M5126 Other intervertebral disc displacement, lumbar region: Secondary | ICD-10-CM | POA: Diagnosis not present

## 2018-07-25 DIAGNOSIS — I10 Essential (primary) hypertension: Secondary | ICD-10-CM | POA: Diagnosis not present

## 2018-07-25 DIAGNOSIS — Z6830 Body mass index (BMI) 30.0-30.9, adult: Secondary | ICD-10-CM | POA: Diagnosis not present

## 2018-07-26 ENCOUNTER — Other Ambulatory Visit: Payer: Self-pay | Admitting: Pharmacist

## 2018-07-26 NOTE — Patient Outreach (Signed)
[facility]) Care Management  07/26/2018  Mellody DanceBarbara Eiben [birth year] [id number]   Following up on status of patient assistance application. Called [facility], spoke to Dr. Myrna BlazerMcConville's RN, who noted that they had completed the provider signature portion. She is refaxing to me today.   Left HIPAA compliant voicemail for patient to check on the status of her portions of the application.    Plan:  If I have not heard back from Ms. Seiler, will f/u in 5-7 business days regarding status of her portion of the application (EOB and patient signatures)   [name], PharmD PGY2 Ambulatory Care Pharmacy Resident Phone: [phone]

## 2018-07-29 ENCOUNTER — Other Ambulatory Visit: Payer: Self-pay | Admitting: Neurosurgery

## 2018-07-29 ENCOUNTER — Other Ambulatory Visit: Payer: Self-pay | Admitting: Pharmacist

## 2018-07-29 DIAGNOSIS — M5126 Other intervertebral disc displacement, lumbar region: Secondary | ICD-10-CM

## 2018-07-29 DIAGNOSIS — E7800 Pure hypercholesterolemia, unspecified: Secondary | ICD-10-CM | POA: Diagnosis not present

## 2018-07-29 DIAGNOSIS — E1142 Type 2 diabetes mellitus with diabetic polyneuropathy: Secondary | ICD-10-CM | POA: Diagnosis not present

## 2018-07-29 DIAGNOSIS — M25549 Pain in joints of unspecified hand: Secondary | ICD-10-CM | POA: Diagnosis not present

## 2018-07-29 DIAGNOSIS — M5441 Lumbago with sciatica, right side: Secondary | ICD-10-CM | POA: Diagnosis not present

## 2018-07-29 DIAGNOSIS — I2510 Atherosclerotic heart disease of native coronary artery without angina pectoris: Secondary | ICD-10-CM | POA: Diagnosis not present

## 2018-07-29 DIAGNOSIS — F411 Generalized anxiety disorder: Secondary | ICD-10-CM | POA: Diagnosis not present

## 2018-07-29 DIAGNOSIS — E1165 Type 2 diabetes mellitus with hyperglycemia: Secondary | ICD-10-CM | POA: Diagnosis not present

## 2018-07-29 NOTE — Patient Outreach (Signed)
[facility]) Care Management  07/29/2018  Jessica DanceBarbara Donovan October 09, 1946 [id number]   Attempted to contact patient to f/u on status of returning Patient Application documents (patient signature and EOB). Left HIPAA compliant voice mail for her to return my call.   Plan:  - Will f/u in 3-5 business days if I have not heard pack from Ms. Anger   [name], PharmD PGY2 Ambulatory Care Pharmacy Resident Phone: [phone]

## 2018-08-02 ENCOUNTER — Ambulatory Visit (INDEPENDENT_AMBULATORY_CARE_PROVIDER_SITE_OTHER): Payer: PPO | Admitting: Podiatry

## 2018-08-02 ENCOUNTER — Encounter: Payer: Self-pay | Admitting: Podiatry

## 2018-08-02 DIAGNOSIS — L97512 Non-pressure chronic ulcer of other part of right foot with fat layer exposed: Secondary | ICD-10-CM | POA: Diagnosis not present

## 2018-08-02 DIAGNOSIS — I70235 Atherosclerosis of native arteries of right leg with ulceration of other part of foot: Secondary | ICD-10-CM | POA: Diagnosis not present

## 2018-08-02 DIAGNOSIS — E0843 Diabetes mellitus due to underlying condition with diabetic autonomic (poly)neuropathy: Secondary | ICD-10-CM | POA: Diagnosis not present

## 2018-08-03 ENCOUNTER — Other Ambulatory Visit: Payer: Self-pay | Admitting: Pharmacist

## 2018-08-03 NOTE — Patient Outreach (Signed)
[facility]) Care Management  08/03/2018  Jessica DanceBarbara Donovan 03/20/1946 [id number]   Contacted patient, HIPAA identifiers verified. She has not received patient assistance application in the mail yet (was originally mailed on 07/26/2018).   Reminded patient to send back 1) signed patient portion of application and 2) proof of income.   Plan - Will reach out to [employer] to get copy of OOP spend report.  - Will f/u with patient in 5-7 business days if I have not heard back from her  [name], PharmD PGY2 Ambulatory Care Pharmacy Resident Phone: [phone]

## 2018-08-04 DIAGNOSIS — H35073 Retinal telangiectasis, bilateral: Secondary | ICD-10-CM | POA: Diagnosis not present

## 2018-08-04 DIAGNOSIS — E119 Type 2 diabetes mellitus without complications: Secondary | ICD-10-CM | POA: Diagnosis not present

## 2018-08-04 NOTE — Progress Notes (Signed)
   Subjective:  72 year old female with PMHx of T2DM presenting today for follow up evaluation of an ulceration of the right 2nd toe. She states the wound looks like it is improving. She reports a new ulceration of the left heel that appeared a few weeks ago. She reports associated drainage from this wound. She has been applying gentamicin cream to the ulcers. There are no modifying factors noted. Patient is here for further evaluation and treatment.   Past Medical History:  Diagnosis Date  . Anxiety   . Chronic back pain   . Collagen vascular disease (HCC)   . Coronary artery disease    a. s/p PCI/DES to dRCA & mLCx in 2014 with repeat LHC in 10/2015 showing patent stents  . Diabetes mellitus without complication (HCC)   . Hypertension   . Low back pain   . Myocardial infarction (HCC)   . Osteomyelitis of toe (HCC) 01/23/2016  . Stroke [facility](HCC)      Objective/Physical Exam General: The patient is alert and oriented x3 in no acute distress.  Dermatology:  Wound #1 noted to the right 2nd toe measuring 0.3 x 0.3 x 0.1 cm (LxWxD).   Wound #2 noted to the left heel measuring 0.6 x 0.4 x 0.2 cm  To the noted ulceration(s), there is no eschar. There is a moderate amount of slough, fibrin, and necrotic tissue noted. Granulation tissue and wound base is red. There is a minimal amount of serosanguineous drainage noted. There is no exposed bone muscle-tendon ligament or joint. There is no malodor. Periwound integrity is intact. Skin is warm, dry and supple bilateral lower extremities.  Vascular: Palpable pedal pulses bilaterally. No edema or erythema noted. Capillary refill within normal limits.  Neurological: Epicritic and protective threshold diminished bilaterally.   Musculoskeletal Exam: Range of motion within normal limits to all pedal and ankle joints bilateral. Muscle strength 5/5 in all groups bilateral.   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 #2 medically necessary excisional debridement including subcutaneous tissue was performed using a tissue nipper and a chisel blade. Excisional debridement of all the necrotic nonviable tissue down to healthy bleeding viable tissue was performed with post-debridement measurements same as pre-. #3 the wound was cleansed and dry sterile dressing applied. #4 Continue using gentamicin cream daily with a bandage.  #5 Continue wearing post op shoe on the right foot.  #6 Return to clinic in 4 weeks.    Felecia ShellingBrent M. Sherre Wooton, DPM [facility]  Dr. Felecia ShellingBrent M. Shiasia Porro, DPM    [address]                                        [place], [place] [address]                Office [phone]  Fax [phone]

## 2018-08-08 ENCOUNTER — Other Ambulatory Visit: Payer: Self-pay | Admitting: Pharmacist

## 2018-08-08 NOTE — Patient Outreach (Signed)
[facility]) Care Management  08/08/2018  [name] 1946/11/01 [id number]    Received call from Gaetano NetAnna Donovan, who stated she is a Child psychotherapistsocial worker who has been helping Jessica Donovan with her patient assistance applications. HIPAA identifiers verified.   [name] asked for my mailing address, as [name] lost the return envelopes I had included. I provided this information.    [name], PharmD PGY2 Ambulatory Care Pharmacy Resident Phone: [phone]

## 2018-08-11 ENCOUNTER — Ambulatory Visit
Admission: RE | Admit: 2018-08-11 | Discharge: 2018-08-11 | Disposition: A | Discharge status: Home / Self Care | Payer: PPO | Source: Ambulatory Visit | Attending: Neurosurgery | Admitting: Neurosurgery

## 2018-08-11 DIAGNOSIS — M5136 Other intervertebral disc degeneration, lumbar region: Secondary | ICD-10-CM | POA: Diagnosis not present

## 2018-08-11 DIAGNOSIS — M48061 Spinal stenosis, lumbar region without neurogenic claudication: Secondary | ICD-10-CM | POA: Insufficient documentation

## 2018-08-11 DIAGNOSIS — M5126 Other intervertebral disc displacement, lumbar region: Secondary | ICD-10-CM | POA: Diagnosis not present

## 2018-08-11 DIAGNOSIS — M5116 Intervertebral disc disorders with radiculopathy, lumbar region: Secondary | ICD-10-CM | POA: Diagnosis not present

## 2018-08-11 MED ORDER — GADOBENATE DIMEGLUMINE 529 MG/ML IV SOLN
15.0000 mL | Freq: Once | INTRAVENOUS | Status: AC | PRN
  Administered 2018-08-11: 15 mL via INTRAVENOUS

## 2018-08-12 ENCOUNTER — Ambulatory Visit: Payer: PPO | Admitting: Pharmacist

## 2018-08-15 DIAGNOSIS — Z6830 Body mass index (BMI) 30.0-30.9, adult: Secondary | ICD-10-CM | POA: Diagnosis not present

## 2018-08-15 DIAGNOSIS — I10 Essential (primary) hypertension: Secondary | ICD-10-CM | POA: Diagnosis not present

## 2018-08-15 DIAGNOSIS — M5126 Other intervertebral disc displacement, lumbar region: Secondary | ICD-10-CM | POA: Diagnosis not present

## 2018-08-16 ENCOUNTER — Other Ambulatory Visit: Payer: Self-pay | Admitting: Pharmacist

## 2018-08-16 NOTE — Patient Outreach (Signed)
[facility]) Care Management  08/16/2018  Mellody DanceBarbara Teska 10-04-1946 [id number]  Received necessary portions of patient assistance applications from patient in the mail today.   Submitted PAP for Lantus to [employer] and for Novolog to [employer] patient assistance programs.   Plan - Will f/u with [employer] and Novolog in 3-5 business days.  [name], PharmD PGY2 Ambulatory Care Pharmacy Resident Phone: [phone]

## 2018-08-19 ENCOUNTER — Other Ambulatory Visit: Payer: Self-pay | Admitting: Pharmacist

## 2018-08-19 NOTE — Patient Outreach (Signed)
[facility]) Care Management  08/19/2018  Jessica DanceBarbara Donovan 30-Aug-1946 [id number]   Contacted [employer] patient assistance regarding Toujeo approval. They are still processing this application.    [employer] patient assistance regarding Novolog. Ms. Jessica QuintGlass has been approved through 12/20/2018.   Attempted to contact Dr. Myrna BlazerMcConville's office to let them know Novolog will be shipping to them in 5-7 business days. Left a message for them to return my call.   Attempted to contact patient to inform of Novolog approval. Left HIPAA compliant voicemail for her to return my call.   Plan - If I have not heard back from the patient or Dr. Myrna BlazerMcConville's office, will f/u in 3-5 business days - Will f/u with [employer] in 3-5 business days   [name], PharmD PGY2 Ambulatory Care Pharmacy Resident Phone: [phone]

## 2018-08-26 ENCOUNTER — Other Ambulatory Visit: Payer: Self-pay | Admitting: Pharmacist

## 2018-08-26 NOTE — Patient Outreach (Signed)
[facility]) Care Management  08/26/2018  Jessica Donovan 01-07-1946 [id number]  Contacted [employer] Cares. Patient has been approved for patient assistance through 12/20/2018 for Toujeo. Patient assistance programs require that insulins be shipped to the provider's office, as they cannot "dispense" these medications directly to the patient.  Contacted patient to inform that her Toujeo and Novolog would be shipped to Dr. Myrna Blazer office. She stated that she would be unable to get transportation to her PCP's office to pick up the medications.   Contacted [employer]. They do not have any options that would allow them to ship directly to the patient. [employer], left message for representative to return my call regarding if they could ship directly to the patient.  Contacted PCP Dr. Myrna Blazer office. Left message asking someone to call me back to brainstorm options to ship the Toujeo/Novolog to the patient.   Will make social work referral to see if there are any transportation options to help the patient get her medications.    Catie Feliz Beam, PharmD PGY2 Ambulatory Care Pharmacy Resident Phone: [phone]

## 2018-08-29 ENCOUNTER — Other Ambulatory Visit: Payer: Self-pay

## 2018-08-29 NOTE — Patient Outreach (Signed)
[facility]) Care Management  08/29/2018  Jessica Donovan [birth year] [id number]  BSW contacted the patient on today's date to discuss recent social work referral for transportation assistance, HIPAA identifiers confirmed. The patient denies any transportation needs stating "I used [employer]". BSW discussed the patients recent referral regarding the need for transportation to her physician office in [place] to pick up insulin. The patient reports she is unable to get to [facility] stating "it costs $125 to get there and back". BSW discussed barriers to transportation options from [place] to [place]. The patient stated understanding.  The patient is not currently out of insulin and states she is expecting her physician to ship the insulin to her home. BSW discussed seeing if her physician is affiliated with another practice for the insulin to be shipped there going forward. The patient stated "I know, I need to find someone closer". BSW will not open today's case as there are no resources for the patient and the patient seems to have a plan in obtaining her insulin at this time.  Bevelyn Ngo, BSW, CDP [facility] [phone]

## 2018-08-30 ENCOUNTER — Ambulatory Visit: Payer: PPO | Admitting: Podiatry

## 2018-09-02 ENCOUNTER — Other Ambulatory Visit: Payer: Self-pay | Admitting: Pharmacist

## 2018-09-02 NOTE — Patient Outreach (Signed)
[facility]) Care Management  09/02/2018  Leighanna Kirn 06/19/1946 [id number]   Contacted Dr. Mamie Levers office, spoke with his nurse. She noted that they were planning on shipping both insulins to Ms. Markin once, but were working with her to establish a sustainable plan.   Will close case d/t goals being met.   Catie Darnelle Maffucci, PharmD PGY2 Ambulatory Care Pharmacy Resident Phone: [phone]

## 2018-09-09 DIAGNOSIS — R300 Dysuria: Secondary | ICD-10-CM | POA: Diagnosis not present

## 2018-09-09 DIAGNOSIS — N3001 Acute cystitis with hematuria: Secondary | ICD-10-CM | POA: Diagnosis not present

## 2018-09-10 ENCOUNTER — Encounter: Payer: Self-pay | Admitting: Emergency Medicine

## 2018-09-10 ENCOUNTER — Other Ambulatory Visit: Payer: Self-pay

## 2018-09-10 ENCOUNTER — Inpatient Hospital Stay
Admission: EM | Admit: 2018-09-10 | Discharge: 2018-09-13 | DRG: 872 | Disposition: A | Discharge status: Home / Self Care | Payer: PPO | Attending: Internal Medicine | Admitting: Internal Medicine

## 2018-09-10 ENCOUNTER — Emergency Department: Payer: PPO

## 2018-09-10 DIAGNOSIS — M19012 Primary osteoarthritis, left shoulder: Secondary | ICD-10-CM | POA: Diagnosis not present

## 2018-09-10 DIAGNOSIS — R531 Weakness: Secondary | ICD-10-CM

## 2018-09-10 DIAGNOSIS — G894 Chronic pain syndrome: Secondary | ICD-10-CM | POA: Diagnosis present

## 2018-09-10 DIAGNOSIS — W19XXXA Unspecified fall, initial encounter: Secondary | ICD-10-CM

## 2018-09-10 DIAGNOSIS — Z955 Presence of coronary angioplasty implant and graft: Secondary | ICD-10-CM | POA: Diagnosis not present

## 2018-09-10 DIAGNOSIS — Z880 Allergy status to penicillin: Secondary | ICD-10-CM

## 2018-09-10 DIAGNOSIS — Z8249 Family history of ischemic heart disease and other diseases of the circulatory system: Secondary | ICD-10-CM

## 2018-09-10 DIAGNOSIS — Z881 Allergy status to other antibiotic agents status: Secondary | ICD-10-CM | POA: Diagnosis not present

## 2018-09-10 DIAGNOSIS — A419 Sepsis, unspecified organism: Secondary | ICD-10-CM | POA: Diagnosis not present

## 2018-09-10 DIAGNOSIS — I10 Essential (primary) hypertension: Secondary | ICD-10-CM | POA: Diagnosis present

## 2018-09-10 DIAGNOSIS — Z87440 Personal history of urinary (tract) infections: Secondary | ICD-10-CM

## 2018-09-10 DIAGNOSIS — F411 Generalized anxiety disorder: Secondary | ICD-10-CM | POA: Diagnosis present

## 2018-09-10 DIAGNOSIS — J9811 Atelectasis: Secondary | ICD-10-CM | POA: Diagnosis not present

## 2018-09-10 DIAGNOSIS — E119 Type 2 diabetes mellitus without complications: Secondary | ICD-10-CM | POA: Diagnosis not present

## 2018-09-10 DIAGNOSIS — Z794 Long term (current) use of insulin: Secondary | ICD-10-CM

## 2018-09-10 DIAGNOSIS — Z885 Allergy status to narcotic agent status: Secondary | ICD-10-CM

## 2018-09-10 DIAGNOSIS — Z79899 Other long term (current) drug therapy: Secondary | ICD-10-CM | POA: Diagnosis not present

## 2018-09-10 DIAGNOSIS — E1142 Type 2 diabetes mellitus with diabetic polyneuropathy: Secondary | ICD-10-CM | POA: Diagnosis not present

## 2018-09-10 DIAGNOSIS — Z888 Allergy status to other drugs, medicaments and biological substances status: Secondary | ICD-10-CM

## 2018-09-10 DIAGNOSIS — Z66 Do not resuscitate: Secondary | ICD-10-CM | POA: Diagnosis present

## 2018-09-10 DIAGNOSIS — N179 Acute kidney failure, unspecified: Secondary | ICD-10-CM | POA: Diagnosis present

## 2018-09-10 DIAGNOSIS — N3000 Acute cystitis without hematuria: Secondary | ICD-10-CM | POA: Diagnosis not present

## 2018-09-10 DIAGNOSIS — I2510 Atherosclerotic heart disease of native coronary artery without angina pectoris: Secondary | ICD-10-CM | POA: Diagnosis present

## 2018-09-10 DIAGNOSIS — Z887 Allergy status to serum and vaccine status: Secondary | ICD-10-CM

## 2018-09-10 DIAGNOSIS — Z7982 Long term (current) use of aspirin: Secondary | ICD-10-CM | POA: Diagnosis not present

## 2018-09-10 DIAGNOSIS — K5900 Constipation, unspecified: Secondary | ICD-10-CM | POA: Diagnosis present

## 2018-09-10 DIAGNOSIS — I69354 Hemiplegia and hemiparesis following cerebral infarction affecting left non-dominant side: Secondary | ICD-10-CM

## 2018-09-10 DIAGNOSIS — S0990XA Unspecified injury of head, initial encounter: Secondary | ICD-10-CM | POA: Diagnosis not present

## 2018-09-10 DIAGNOSIS — Z89422 Acquired absence of other left toe(s): Secondary | ICD-10-CM

## 2018-09-10 DIAGNOSIS — R195 Other fecal abnormalities: Secondary | ICD-10-CM | POA: Diagnosis not present

## 2018-09-10 DIAGNOSIS — M25512 Pain in left shoulder: Secondary | ICD-10-CM | POA: Diagnosis present

## 2018-09-10 DIAGNOSIS — F419 Anxiety disorder, unspecified: Secondary | ICD-10-CM | POA: Diagnosis present

## 2018-09-10 DIAGNOSIS — I252 Old myocardial infarction: Secondary | ICD-10-CM

## 2018-09-10 LAB — URINALYSIS, COMPLETE (UACMP) WITH MICROSCOPIC
BACTERIA UA: NONE SEEN
Bilirubin Urine: NEGATIVE
GLUCOSE, UA: 50 mg/dL — AB
HGB URINE DIPSTICK: NEGATIVE
KETONES UR: NEGATIVE mg/dL
Nitrite: NEGATIVE
PH: 5 (ref 5.0–8.0)
PROTEIN: 30 mg/dL — AB
Specific Gravity, Urine: 1.019 (ref 1.005–1.030)
WBC, UA: >50 WBC/hpf — ABNORMAL HIGH (ref 0–5)

## 2018-09-10 LAB — CBC WITH DIFFERENTIAL/PLATELET
BASOS PCT: 1 %
Basophils Absolute: 0 10*3/uL (ref 0–0.1)
Eosinophils Absolute: 0.1 10*3/uL (ref 0–0.7)
Eosinophils Relative: 1 %
HEMATOCRIT: 39.6 % (ref 35.0–47.0)
HEMOGLOBIN: 13.5 g/dL (ref 12.0–16.0)
LYMPHS PCT: 17 %
Lymphs Abs: 1.2 10*3/uL (ref 1.0–3.6)
MCH: 33.6 pg (ref 26.0–34.0)
MCHC: 34.1 g/dL (ref 32.0–36.0)
MCV: 98.6 fL (ref 80.0–100.0)
MONO ABS: 0.5 10*3/uL (ref 0.2–0.9)
Monocytes Relative: 8 %
NEUTROS ABS: 5.2 10*3/uL (ref 1.4–6.5)
NEUTROS PCT: 73 %
Platelets: 188 10*3/uL (ref 150–440)
RBC: 4.02 MIL/uL (ref 3.80–5.20)
RDW: 13.4 % (ref 11.5–14.5)
WBC: 7.1 10*3/uL (ref 3.6–11.0)

## 2018-09-10 LAB — CBC
HCT: 37.8 % (ref 35.0–47.0)
Hemoglobin: 13.1 g/dL (ref 12.0–16.0)
MCH: 34.1 pg — AB (ref 26.0–34.0)
MCHC: 34.8 g/dL (ref 32.0–36.0)
MCV: 98 fL (ref 80.0–100.0)
Platelets: 176 10*3/uL (ref 150–440)
RBC: 3.85 MIL/uL (ref 3.80–5.20)
RDW: 13.2 % (ref 11.5–14.5)
WBC: 8.1 10*3/uL (ref 3.6–11.0)

## 2018-09-10 LAB — COMPREHENSIVE METABOLIC PANEL
ALT: 11 U/L (ref 0–44)
AST: 20 U/L (ref 15–41)
Albumin: 4.2 g/dL (ref 3.5–5.0)
Alkaline Phosphatase: 82 U/L (ref 38–126)
Anion gap: 9 (ref 5–15)
BUN: 26 mg/dL — ABNORMAL HIGH (ref 8–23)
CHLORIDE: 102 mmol/L (ref 98–111)
CO2: 24 mmol/L (ref 22–32)
CREATININE: 1.04 mg/dL — AB (ref 0.44–1.00)
Calcium: 9.3 mg/dL (ref 8.9–10.3)
GFR calc Af Amer: >60 mL/min (ref >60)
GFR calc non Af Amer: 52 mL/min — ABNORMAL LOW (ref >60)
Glucose, Bld: 222 mg/dL — ABNORMAL HIGH (ref 70–99)
POTASSIUM: 4.9 mmol/L (ref 3.5–5.1)
SODIUM: 135 mmol/L (ref 135–145)
Total Bilirubin: 0.5 mg/dL (ref 0.3–1.2)
Total Protein: 6.6 g/dL (ref 6.5–8.1)

## 2018-09-10 LAB — PROCALCITONIN: PROCALCITONIN: 0.1 ng/mL

## 2018-09-10 LAB — LACTIC ACID, PLASMA
LACTIC ACID, VENOUS: 1.7 mmol/L (ref 0.5–1.9)
LACTIC ACID, VENOUS: 1.3 mmol/L (ref 0.5–1.9)
Lactic Acid, Venous: 2.1 mmol/L (ref 0.5–1.9)
Lactic Acid, Venous: 1.7 mmol/L (ref 0.5–1.9)

## 2018-09-10 LAB — PROTIME-INR
INR: 1
PROTHROMBIN TIME: 13.1 s (ref 11.4–15.2)

## 2018-09-10 LAB — CREATININE, SERUM
Creatinine, Ser: 0.96 mg/dL (ref 0.44–1.00)
GFR calc Af Amer: >60 mL/min (ref >60)
GFR calc non Af Amer: 58 mL/min — ABNORMAL LOW (ref >60)

## 2018-09-10 LAB — GLUCOSE, CAPILLARY: Glucose-Capillary: 196 mg/dL — ABNORMAL HIGH (ref 70–99)

## 2018-09-10 MED ORDER — NITROFURANTOIN MACROCRYSTAL 50 MG PO CAPS
100.0000 mg | ORAL_CAPSULE | Freq: Every day | ORAL | Status: DC
  Administered 2018-09-10 – 2018-09-12 (×3): 100 mg via ORAL
  Filled 2018-09-10 (×3): qty 2

## 2018-09-10 MED ORDER — TRAZODONE HCL 50 MG PO TABS
100.0000 mg | ORAL_TABLET | Freq: Every day | ORAL | Status: DC
  Administered 2018-09-10 – 2018-09-12 (×3): 100 mg via ORAL
  Filled 2018-09-10 (×3): qty 2

## 2018-09-10 MED ORDER — PANTOPRAZOLE SODIUM 40 MG PO TBEC
40.0000 mg | DELAYED_RELEASE_TABLET | Freq: Every day | ORAL | Status: DC
  Administered 2018-09-10 – 2018-09-13 (×4): 40 mg via ORAL
  Filled 2018-09-10 (×4): qty 1

## 2018-09-10 MED ORDER — SODIUM CHLORIDE 0.9 % IV SOLN
INTRAVENOUS | Status: DC
  Administered 2018-09-10 – 2018-09-11 (×2): via INTRAVENOUS

## 2018-09-10 MED ORDER — ONDANSETRON HCL 4 MG/2ML IJ SOLN
4.0000 mg | Freq: Four times a day (QID) | INTRAMUSCULAR | Status: DC | PRN
  Administered 2018-09-11: 03:00:00 4 mg via INTRAVENOUS
  Filled 2018-09-10: qty 2

## 2018-09-10 MED ORDER — DOCUSATE SODIUM 100 MG PO CAPS
100.0000 mg | ORAL_CAPSULE | Freq: Two times a day (BID) | ORAL | Status: DC
  Administered 2018-09-10 – 2018-09-13 (×6): 100 mg via ORAL
  Filled 2018-09-10 (×6): qty 1

## 2018-09-10 MED ORDER — POLYETHYLENE GLYCOL 3350 17 G PO PACK
1.0000 | PACK | Freq: Every day | ORAL | Status: DC
  Administered 2018-09-11 – 2018-09-12 (×2): 17 g via ORAL
  Filled 2018-09-10 (×3): qty 1

## 2018-09-10 MED ORDER — INSULIN GLARGINE 100 UNIT/ML ~~LOC~~ SOLN
12.0000 [IU] | Freq: Every day | SUBCUTANEOUS | Status: DC
  Administered 2018-09-10 – 2018-09-12 (×3): 12 [IU] via SUBCUTANEOUS
  Filled 2018-09-10 (×4): qty 0.12

## 2018-09-10 MED ORDER — VITAMIN B-12 1000 MCG PO TABS
1000.0000 ug | ORAL_TABLET | Freq: Every day | ORAL | Status: DC
  Administered 2018-09-11 – 2018-09-13 (×3): 1000 ug via ORAL
  Filled 2018-09-10 (×3): qty 1

## 2018-09-10 MED ORDER — MIRTAZAPINE 15 MG PO TABS
15.0000 mg | ORAL_TABLET | Freq: Every day | ORAL | Status: DC
  Administered 2018-09-10 – 2018-09-12 (×3): 15 mg via ORAL
  Filled 2018-09-10 (×3): qty 1

## 2018-09-10 MED ORDER — MORPHINE SULFATE ER 15 MG PO TBCR
15.0000 mg | EXTENDED_RELEASE_TABLET | Freq: Two times a day (BID) | ORAL | Status: DC
  Administered 2018-09-10 – 2018-09-13 (×6): 15 mg via ORAL
  Filled 2018-09-10 (×6): qty 1

## 2018-09-10 MED ORDER — SODIUM CHLORIDE 0.9 % IV BOLUS: 1000.0000 mL | Freq: Once | INTRAVENOUS | Status: DC

## 2018-09-10 MED ORDER — CELECOXIB 200 MG PO CAPS
200.0000 mg | ORAL_CAPSULE | Freq: Every day | ORAL | Status: DC
  Administered 2018-09-11 – 2018-09-13 (×3): 200 mg via ORAL
  Filled 2018-09-10 (×3): qty 1

## 2018-09-10 MED ORDER — ASPIRIN EC 81 MG PO TBEC
81.0000 mg | DELAYED_RELEASE_TABLET | Freq: Every day | ORAL | Status: DC
  Administered 2018-09-10 – 2018-09-13 (×4): 81 mg via ORAL
  Filled 2018-09-10 (×4): qty 1

## 2018-09-10 MED ORDER — CYCLOBENZAPRINE HCL 10 MG PO TABS
10.0000 mg | ORAL_TABLET | Freq: Three times a day (TID) | ORAL | Status: DC | PRN
  Administered 2018-09-12 – 2018-09-13 (×2): 10 mg via ORAL
  Filled 2018-09-10 (×2): qty 1

## 2018-09-10 MED ORDER — LISINOPRIL 20 MG PO TABS
20.0000 mg | ORAL_TABLET | Freq: Every day | ORAL | Status: DC
  Administered 2018-09-10 – 2018-09-12 (×3): 20 mg via ORAL
  Filled 2018-09-10 (×3): qty 1

## 2018-09-10 MED ORDER — BISACODYL 10 MG RE SUPP
10.0000 mg | Freq: Every day | RECTAL | Status: DC | PRN
  Filled 2018-09-10: qty 1

## 2018-09-10 MED ORDER — SODIUM CHLORIDE 0.9 % IV BOLUS
500.0000 mL | Freq: Once | INTRAVENOUS | Status: AC
  Administered 2018-09-10: 500 mL via INTRAVENOUS

## 2018-09-10 MED ORDER — SODIUM CHLORIDE 0.9 % IV SOLN
1.0000 g | Freq: Once | INTRAVENOUS | Status: AC
  Administered 2018-09-10: 1 g via INTRAVENOUS
  Filled 2018-09-10: qty 10

## 2018-09-10 MED ORDER — METOPROLOL TARTRATE 25 MG PO TABS
25.0000 mg | ORAL_TABLET | Freq: Two times a day (BID) | ORAL | Status: DC
  Administered 2018-09-10 – 2018-09-13 (×6): 25 mg via ORAL
  Filled 2018-09-10 (×7): qty 1

## 2018-09-10 MED ORDER — INSULIN ASPART 100 UNIT/ML ~~LOC~~ SOLN
0.0000 [IU] | Freq: Three times a day (TID) | SUBCUTANEOUS | Status: DC
  Administered 2018-09-11: 1 [IU] via SUBCUTANEOUS
  Administered 2018-09-11: 2 [IU] via SUBCUTANEOUS
  Administered 2018-09-11: 14:00:00 3 [IU] via SUBCUTANEOUS
  Administered 2018-09-12 (×2): 2 [IU] via SUBCUTANEOUS
  Administered 2018-09-12 – 2018-09-13 (×2): 1 [IU] via SUBCUTANEOUS
  Administered 2018-09-13: 5 [IU] via SUBCUTANEOUS
  Filled 2018-09-10 (×8): qty 1

## 2018-09-10 MED ORDER — OXYBUTYNIN CHLORIDE ER 5 MG PO TB24
5.0000 mg | ORAL_TABLET | Freq: Every day | ORAL | Status: DC
  Administered 2018-09-10 – 2018-09-12 (×3): 5 mg via ORAL
  Filled 2018-09-10 (×4): qty 1

## 2018-09-10 MED ORDER — ADULT MULTIVITAMIN W/MINERALS CH
1.0000 | ORAL_TABLET | Freq: Every day | ORAL | Status: DC
  Administered 2018-09-11 – 2018-09-13 (×3): 1 via ORAL
  Filled 2018-09-10 (×3): qty 1

## 2018-09-10 MED ORDER — ACETAMINOPHEN 500 MG PO TABS
1000.0000 mg | ORAL_TABLET | Freq: Three times a day (TID) | ORAL | Status: DC | PRN
  Administered 2018-09-11 – 2018-09-12 (×2): 1000 mg via ORAL
  Filled 2018-09-10 (×2): qty 2

## 2018-09-10 MED ORDER — FLEET ENEMA 7-19 GM/118ML RE ENEM: 1.0000 | ENEMA | Freq: Once | RECTAL | Status: DC | PRN

## 2018-09-10 MED ORDER — ALPRAZOLAM 1 MG PO TABS
1.0000 mg | ORAL_TABLET | Freq: Three times a day (TID) | ORAL | Status: DC
  Administered 2018-09-10 – 2018-09-13 (×9): 1 mg via ORAL
  Filled 2018-09-10 (×9): qty 1

## 2018-09-10 MED ORDER — ENOXAPARIN SODIUM 40 MG/0.4ML ~~LOC~~ SOLN
40.0000 mg | SUBCUTANEOUS | Status: DC
  Administered 2018-09-10 – 2018-09-12 (×3): 40 mg via SUBCUTANEOUS
  Filled 2018-09-10 (×3): qty 0.4

## 2018-09-10 MED ORDER — PREGABALIN 75 MG PO CAPS
150.0000 mg | ORAL_CAPSULE | Freq: Two times a day (BID) | ORAL | Status: DC
  Administered 2018-09-10 – 2018-09-13 (×6): 150 mg via ORAL
  Filled 2018-09-10 (×7): qty 2

## 2018-09-10 MED ORDER — ONDANSETRON HCL 4 MG/2ML IJ SOLN
4.0000 mg | Freq: Once | INTRAMUSCULAR | Status: AC
  Administered 2018-09-10: 4 mg via INTRAVENOUS
  Filled 2018-09-10: qty 2

## 2018-09-10 MED ORDER — FESOTERODINE FUMARATE ER 8 MG PO TB24
8.0000 mg | ORAL_TABLET | Freq: Every day | ORAL | Status: DC
  Administered 2018-09-11 – 2018-09-13 (×3): 8 mg via ORAL
  Filled 2018-09-10 (×3): qty 1

## 2018-09-10 MED ORDER — VITAMIN D (ERGOCALCIFEROL) 1.25 MG (50000 UNIT) PO CAPS
50000.0000 [IU] | ORAL_CAPSULE | ORAL | Status: DC
  Administered 2018-09-11: 50000 [IU] via ORAL
  Filled 2018-09-10: qty 1

## 2018-09-10 MED ORDER — SODIUM CHLORIDE 0.9 % IV SOLN
1.0000 g | INTRAVENOUS | Status: DC
  Administered 2018-09-11 – 2018-09-13 (×3): 1 g via INTRAVENOUS
  Filled 2018-09-10 (×3): qty 1

## 2018-09-10 MED ORDER — ACETAMINOPHEN 325 MG PO TABS
650.0000 mg | ORAL_TABLET | Freq: Once | ORAL | Status: AC
  Administered 2018-09-10: 650 mg via ORAL
  Filled 2018-09-10: qty 2

## 2018-09-10 MED ORDER — BIOTIN 5 MG PO CAPS: 5.0000 mg | ORAL_CAPSULE | Freq: Every day | ORAL | Status: DC

## 2018-09-10 MED ORDER — VENLAFAXINE HCL ER 75 MG PO CP24
75.0000 mg | ORAL_CAPSULE | Freq: Every day | ORAL | Status: DC
  Administered 2018-09-11 – 2018-09-13 (×3): 75 mg via ORAL
  Filled 2018-09-10 (×3): qty 1

## 2018-09-10 MED ORDER — DIPHENOXYLATE-ATROPINE 2.5-0.025 MG PO TABS: 1.0000 | ORAL_TABLET | ORAL | Status: DC | PRN

## 2018-09-10 MED ORDER — NITROGLYCERIN 0.4 MG SL SUBL: 0.4000 mg | SUBLINGUAL_TABLET | SUBLINGUAL | Status: DC | PRN

## 2018-09-10 MED ORDER — MORPHINE SULFATE 15 MG PO TABS
15.0000 mg | ORAL_TABLET | ORAL | Status: DC | PRN
  Administered 2018-09-10 – 2018-09-13 (×3): 15 mg via ORAL
  Filled 2018-09-10 (×4): qty 1

## 2018-09-10 MED ORDER — FERROUS SULFATE 325 (65 FE) MG PO TABS
325.0000 mg | ORAL_TABLET | Freq: Every day | ORAL | Status: DC
  Administered 2018-09-11 – 2018-09-13 (×3): 325 mg via ORAL
  Filled 2018-09-10 (×4): qty 1

## 2018-09-10 MED ORDER — ATORVASTATIN CALCIUM 20 MG PO TABS
20.0000 mg | ORAL_TABLET | Freq: Every day | ORAL | Status: DC
  Administered 2018-09-10 – 2018-09-12 (×3): 20 mg via ORAL
  Filled 2018-09-10 (×3): qty 1

## 2018-09-10 MED ORDER — INSULIN ASPART 100 UNIT/ML ~~LOC~~ SOLN: 3.0000 [IU] | Freq: Three times a day (TID) | SUBCUTANEOUS | Status: DC | PRN

## 2018-09-10 MED ORDER — ISOSORBIDE MONONITRATE ER 30 MG PO TB24
30.0000 mg | ORAL_TABLET | Freq: Every day | ORAL | Status: DC
  Administered 2018-09-10 – 2018-09-13 (×4): 30 mg via ORAL
  Filled 2018-09-10 (×4): qty 1

## 2018-09-10 MED ORDER — ONDANSETRON HCL 4 MG PO TABS: 4.0000 mg | ORAL_TABLET | Freq: Four times a day (QID) | ORAL | Status: DC | PRN

## 2018-09-10 NOTE — Progress Notes (Signed)
   09/10/18 2000  Clinical Encounter Type  Visited With Patient  Visit Type Initial;Spiritual support (Advance Directives education)  Referral From Nurse  Recommendations Follow-up, if requested.   Chaplain followed up on OR for AD education and creation. Patient was minimally aware of AD, acknowledging that she did not have one. Chaplain explained the HCPOA and the patient became increasingly sleepy. Patient said she had not slept for three days. Chaplain left the AD booklet and offered to return and continue the education in the morning, if desired.

## 2018-09-10 NOTE — Progress Notes (Signed)
PHARMACIST - PHYSICIAN ORDER COMMUNICATION  CONCERNING: P&T Medication Policy on Herbal Medications  DESCRIPTION:  This patient's order for:  Biotin  has been noted.  This product(s) is classified as an "herbal" or natural product. Due to a lack of definitive safety studies or FDA approval, nonstandard manufacturing practices, plus the potential risk of unknown drug-drug interactions while on inpatient medications, the Pharmacy and Therapeutics Committee does not permit the use of "herbal" or natural products of this type within [facility].   ACTION TAKEN: The pharmacy department is unable to verify this order at this time. Please reevaluate patient's clinical condition at discharge and address if the herbal or natural product(s) should be resumed at that time.  Clovia CuffLisa Aleyah Balik, PharmD, BCPS 09/10/2018 4:48 PM

## 2018-09-10 NOTE — ED Notes (Signed)
Pt states there is residual Left sided facial drooping and blurred vision from prior CVA.

## 2018-09-10 NOTE — ED Notes (Signed)
Date and time results received: 09/10/18 1420 (use smartphrase ".now" to insert current time)  Test: lactic Critical Value: 2.1  Name of Provider Notified: McShane  Orders Received? Or Actions Taken?: Orders Received - See Orders for details

## 2018-09-10 NOTE — H&P (Signed)
[facility] NAME: Jessica Donovan    MR#:  [id number]  DATE OF BIRTH:  [birth year]  DATE OF ADMISSION:  09/10/2018  PRIMARY CARE PHYSICIAN: Robyn Haber, MD   REQUESTING/REFERRING PHYSICIAN: Burlene Arnt MD  CHIEF COMPLAINT:  Abdominal pain  HISTORY OF PRESENT ILLNESS:  Jessica Donovan  is a 72 y.o. female with a known history of coronary artery disease, hypertension, diabetes mellitus, history of stroke with left-sided weakness and multiple other medical problems is presenting to the ED with a complaint of dysuria and urinary frequency associated with fever.  Patient chronically takes nitrofurantoin to prevent UTIs. patient was seen by urgent care for urinary frequency and dysuria and was started on Bactrim but she had allergic reaction to that and still having symptoms of urinary frequency and dysuria associated with fever which prompted her to come to the hospital.  Patient was complaining of flank pain.  CT abdomen has revealed no acute findings but moderate to large stool in the colon.  Urine culture was sent in the ED and she did started on IV Rocephin and hospitalist team is called to admit the patient.  Patient denies any nausea during my examination.  PAST MEDICAL HISTORY:   Past Medical History:  Diagnosis Date  . Anxiety   . Chronic back pain   . Collagen vascular disease ([place])   . Coronary artery disease    a. s/p PCI/DES to dRCA & mLCx in 2014 with repeat LHC in 10/2015 showing patent stents  . Diabetes mellitus without complication ([place])   . Hypertension   . Low back pain   . Myocardial infarction ([place])   . Osteomyelitis of toe ([place]) 01/23/2016  . Stroke [facility])     PAST SURGICAL HISTOIRY:   Past Surgical History:  Procedure Laterality Date  . AMPUTATION TOE Left 11/10/2015   Procedure: AMPUTATION TOE;  Surgeon: Sharlotte Alamo, MD;  Location: [facility] ORS;  Service: Podiatry;  Laterality: Left;  . AMPUTATION TOE Left  01/24/2016   Procedure: AMPUTATION TOE (2nd mpj);  Surgeon: Sharlotte Alamo, DPM;  Location: [facility] ORS;  Service: Podiatry;  Laterality: Left;  . CARDIAC CATHETERIZATION N/A 11/12/2015   Procedure: Left Heart Cath and Coronary Angiography;  Surgeon: Isaias Cowman, MD;  Location: [place] CV LAB;  Service: Cardiovascular;  Laterality: N/A;  . COLONOSCOPY WITH PROPOFOL N/A 07/20/2017   Procedure: COLONOSCOPY WITH PROPOFOL;  Surgeon: Jonathon Bellows, MD;  Location: [facility] ENDOSCOPY;  Service: Endoscopy;  Laterality: N/A;  . JOINT REPLACEMENT    . TOE AMPUTATION      SOCIAL HISTORY:   Social History   Tobacco Use  . Smoking status: Never Smoker  . Smokeless tobacco: Never Used  Substance Use Topics  . Alcohol use: No    FAMILY HISTORY:   Family History  Problem Relation Age of Onset  . CAD Mother   . CAD Father     DRUG ALLERGIES:   Allergies  Allergen Reactions  . Codeine Nausea And Vomiting  . Flu Virus Vaccine     Other reaction(s): Other (See Comments) Passed out for 12 days  . Influenza Vaccines Other (See Comments)    Reaction:  Caused pt to pass out   . Methadone Hives and Itching  . Oxycodone-Acetaminophen Nausea And Vomiting  . Percocet [Oxycodone-Acetaminophen] Nausea And Vomiting  . Tetanus Antitoxin   . Tetanus Toxoids Swelling and Other (See Comments)    Reaction:  Swelling at injection site  . Penicillin  G Rash    Has patient had a PCN reaction causing immediate rash, facial/tongue/throat swelling, SOB or lightheadedness with hypotension: Yes Has patient had a PCN reaction causing severe rash involving mucus membranes or skin necrosis: No Has patient had a PCN reaction that required hospitalization: No Has patient had a PCN reaction occurring within the last 10 years: No If all of the above answers are "NO", then may proceed with Cephalosporin use..  . Tetracyclines & Related Rash    REVIEW OF SYSTEMS:  CONSTITUTIONAL: reports fever, fatigue and weakness.   EYES: No blurred or double vision.  EARS, NOSE, AND THROAT: No tinnitus or ear pain.  RESPIRATORY: No cough, shortness of breath, wheezing or hemoptysis.  CARDIOVASCULAR: No chest pain, orthopnea, edema.  GASTROINTESTINAL: No nausea, vomiting, diarrhea or abdominal pain.  GENITOURINARY: No dysuria, hematuria.  ENDOCRINE: No polyuria, nocturia,  HEMATOLOGY: No anemia, easy bruising or bleeding SKIN: No rash or lesion. MUSCULOSKELETAL: No joint pain or arthritis.   NEUROLOGIC: Chronic left-sided weakness from stroke no tingling, numbness PSYCHIATRY: No anxiety or depression.   MEDICATIONS AT HOME:   Prior to Admission medications   Medication Sig Start Date End Date Taking? Authorizing Provider  acetaminophen (TYLENOL) 500 MG tablet Take 1,000 mg by mouth every 8 (eight) hours as needed for mild pain or headache.    Yes [provider]  ALPRAZolam Duanne Moron) 1 MG tablet Take 1 mg by mouth 3 (three) times daily.  02/08/17  Yes [provider]  aspirin EC 81 MG tablet Take 81 mg by mouth daily.   Yes [provider]  atorvastatin (LIPITOR) 20 MG tablet Take 20 mg by mouth at bedtime.    Yes [provider]  Biotin 5 MG CAPS Take 5 mg by mouth daily.    Yes [provider]  celecoxib (CELEBREX) 200 MG capsule Take 200 mg by mouth daily.    Yes [provider]  diphenoxylate-atropine (LOMOTIL) 2.5-0.025 MG tablet Take 1 tablet by mouth every 4 (four) hours as needed for diarrhea or loose stools.    Yes [provider]  ferrous sulfate 325 (65 FE) MG tablet Take 325 mg by mouth daily with breakfast.   Yes [provider]  fesoterodine (TOVIAZ) 8 MG TB24 tablet Take 8 mg by mouth daily.   Yes [provider]  insulin aspart (NOVOLOG) 100 UNIT/ML injection Inject 3-15 Units into the skin 3 (three) times daily with meals as needed for high blood sugar. Pt uses as needed per sliding scale:    Less than 140:  0 units   140-180:  3 units 181-220:  4 units 221- 260:  6 units 261- 320:  8 units 321-360:  10 units 361-400:  12 units Greater than 400:  15 units   Yes [provider]  Insulin Glargine (TOUJEO SOLOSTAR) 300 UNIT/ML SOPN Inject 20 Units into the skin daily. 07/07/18  Yes Fritzi Mandes, MD  isosorbide mononitrate (IMDUR) 30 MG 24 hr tablet Take 30 mg by mouth daily.    Yes [provider]  lisinopril (PRINIVIL,ZESTRIL) 20 MG tablet Take 20 mg by mouth daily.   Yes [provider]  metoprolol tartrate (LOPRESSOR) 25 MG tablet Take 25 mg by mouth 2 (two) times daily. 01/28/17  Yes [provider]  mirtazapine (REMERON) 15 MG tablet Take 15 mg by mouth at bedtime.   Yes [provider]  Multiple Vitamin (MULTIVITAMIN WITH MINERALS) TABS tablet Take 1 tablet by mouth daily.  Yes [provider]  nitrofurantoin (MACRODANTIN) 100 MG capsule Take 100 mg by mouth daily.   Yes [provider]  nitroGLYCERIN (NITROSTAT) 0.4 MG SL tablet Place 0.4 mg under the tongue every 5 (five) minutes as needed for chest pain. Reported on 04/16/2016   Yes [provider]  omeprazole (PRILOSEC) 20 MG capsule Take 20 mg by mouth daily.   Yes [provider]  oxybutynin (DITROPAN-XL) 10 MG 24 hr tablet oxybutynin chloride ER 10 mg tablet,extended release 24 hr   Yes [provider]  sulfamethoxazole-trimethoprim (BACTRIM DS,SEPTRA DS) 800-160 MG tablet Take 1 tablet by mouth 2 (two) times daily. 09/09/18  Yes [provider]  traZODone (DESYREL) 100 MG tablet Take 100 mg by mouth at bedtime. 01/28/17  Yes [provider]  venlafaxine XR (EFFEXOR-XR) 75 MG 24 hr capsule Take 75 mg by mouth daily with breakfast.    Yes [provider]  vitamin B-12 (CYANOCOBALAMIN) 1000 MCG tablet Take 1,000 mcg by mouth daily.   Yes [provider]  Vitamin D, Ergocalciferol, (DRISDOL) 50000 units CAPS capsule Take 50,000 Units  by mouth every Sunday.    Yes [provider]  cyclobenzaprine (FLEXERIL) 10 MG tablet Take 1 tablet (10 mg total) by mouth 3 (three) times daily as needed for muscle spasms. 01/27/16   Gladstone Lighter, MD  morphine (MS CONTIN) 15 MG 12 hr tablet Take 1 tablet (15 mg total) by mouth every 12 (twelve) hours. Patient not taking: Reported on 09/10/2018 05/23/17   Dustin Flock, MD  polyethylene glycol powder [facility]) powder 17 grams as needed each time for constpation Patient not taking: Reported on 07/19/2018 06/24/17   Jonathon Bellows, MD  pregabalin (LYRICA) 150 MG capsule Take 1 capsule (150 mg total) by mouth 2 (two) times daily. Patient not taking: Reported on 09/10/2018 01/19/17   Hillary Bow, MD      VITAL SIGNS:  Blood pressure (!) 102/43, pulse 92, temperature 98.1 F (36.7 C), temperature source Oral, resp. rate (!) 21, height '5\' 5"'  (1.651 m), weight 78.9 kg, SpO2 94 %.  PHYSICAL EXAMINATION:  GENERAL:  72 y.o.-year-old patient lying in the bed with no acute distress.  EYES: Pupils equal, round, reactive to light and accommodation. No scleral icterus. Extraocular muscles intact.  HEENT: Head atraumatic, normocephalic. Oropharynx and nasopharynx clear.  NECK:  Supple, no jugular venous distention. No thyroid enlargement, no tenderness.  LUNGS: Normal breath sounds bilaterally, no wheezing, rales,rhonchi or crepitation. No use of accessory muscles of respiration.  CARDIOVASCULAR: S1, S2 normal. No murmurs, rubs, or gallops.  ABDOMEN: Soft, nontender, nondistended. Bowel sounds present. No organomegaly or mass.  EXTREMITIES: No pedal edema, cyanosis, or clubbing.  NEUROLOGIC: Cranial nerves II through XII are intact. Muscle strength 5/5 in the right extremities, 3-4 out of 5 in left  extremities. Sensation intact. Gait not checked.  PSYCHIATRIC: The patient is alert and oriented x 3.  SKIN: No obvious rash, lesion, or ulcer.   LABORATORY PANEL:   CBC Recent Labs  Lab  09/10/18 1345  WBC 7.1  HGB 13.5  HCT 39.6  PLT 188   ------------------------------------------------------------------------------------------------------------------  Chemistries  Recent Labs  Lab 09/10/18 1345  NA 135  K 4.9  CL 102  CO2 24  GLUCOSE 222*  BUN 26*  CREATININE 1.04*  CALCIUM 9.3  AST 20  ALT 11  ALKPHOS 82  BILITOT 0.5   ------------------------------------------------------------------------------------------------------------------  Cardiac Enzymes No results for input(s): TROPONINI in the last 168 hours. ------------------------------------------------------------------------------------------------------------------  RADIOLOGY:  Dg Chest 2 View  Result Date: 09/10/2018 CLINICAL DATA:  Fever, tachycardia EXAM: CHEST - 2 VIEW COMPARISON:  08/15/2018 FINDINGS: Very low lung volumes resulting in vascular crowding and basilar atelectasis. Stable cardiomegaly. No large effusion, significant collapse or consolidation. No pneumothorax. Trachea midline. Aorta is atherosclerotic. IMPRESSION: Very low volume exam with vascular crowding and basilar atelectasis. Electronically Signed   By: Jerilynn Mages.  Shick M.D.   On: 09/10/2018 14:13   Ct Head Wo Contrast  Result Date: 09/10/2018 CLINICAL DATA:  72 year old female with ataxia, multiple recent falls and head injuries. EXAM: CT HEAD WITHOUT CONTRAST TECHNIQUE: Contiguous axial images were obtained from the base of the skull through the vertex without intravenous contrast. COMPARISON:  07/06/2018 and prior CTs FINDINGS: Brain: No evidence of acute infarction, hemorrhage, hydrocephalus, extra-axial collection or mass lesion/mass effect. Mild atrophy and mild chronic small-vessel white matter ischemic changes again noted. Vascular: Carotid atherosclerotic calcifications again identified. Skull: Normal. Negative for fracture or focal lesion. Sinuses/Orbits: No acute finding. Other: None. IMPRESSION: 1. No evidence of acute  intracranial abnormality 2. Mild atrophy and chronic small-vessel white matter ischemic changes. Electronically Signed   By: Margarette Canada M.D.   On: 09/10/2018 15:00   Ct Renal Stone Study  Result Date: 09/10/2018 CLINICAL DATA:  72 year old female with acute abdominal and pelvic pain with fever. EXAM: CT ABDOMEN AND PELVIS WITHOUT CONTRAST TECHNIQUE: Multidetector CT imaging of the abdomen and pelvis was performed following the standard protocol without IV contrast. COMPARISON:  06/22/2017 and prior CTs. FINDINGS: Please note that parenchymal abnormalities may be missed without intravenous contrast. Lower chest: No acute abnormality. Coronary artery calcifications again identified. Hepatobiliary: The liver is unremarkable. The patient is status post cholecystectomy. No biliary dilatation. Pancreas: Atrophic without other significant abnormality Spleen: Unremarkable Adrenals/Urinary Tract: The kidneys, bladder and adrenal glands are unremarkable. Stomach/Bowel: Stomach is within normal limits. No evidence of bowel wall thickening, distention, or inflammatory changes. A moderate to large amount of colonic stool is noted. Vascular/Lymphatic: Aortic atherosclerosis. No enlarged abdominal or pelvic lymph nodes. Reproductive: Status post hysterectomy. No adnexal masses. Other: No ascites, focal collection or pneumoperitoneum. A small umbilical hernia containing fat is again noted. Musculoskeletal: Multilevel degenerative changes throughout the lumbar spine noted. No acute or suspicious bony abnormalities noted. IMPRESSION: 1. No acute abnormality 2. Moderate to large amount of colonic stool. No evidence of bowel wall thickening, inflammation or bowel obstruction. 3. Multilevel lumbar spine degenerative changes again noted. 4.  Aortic Atherosclerosis (ICD10-I70.0). Electronically Signed   By: Margarette Canada M.D.   On: 09/10/2018 14:57    EKG:   Orders placed or performed during the hospital encounter of 09/10/18  .  ED EKG  . ED EKG    IMPRESSION AND PLAN:     Sepsis met septic criteria with fever, tachycardia and elevated lactic acid at the time of admission Admit to MedSurg unit Urine culture and sensitivity and blood cultures are obtained Monitor lactic acid level check procalcitonin IV Rocephin and IV fluids   Acute cystitis failed outpatient antibiotics Urine culture and sensitivity ordered and patient is started on IV Rocephin.  Follow-up on the cultures including blood culture and sensitivity  Acute kidney injury prerenal from poor p.o. Intake IV fluids and avoid nephrotoxins and monitor renal function closely  Moderate to severe constipation Senokot, MiraLAX, Dulcolax suppository Enema as needed  Diabetes mellitus Sliding scale insulin Decreased home dose of glargine to 12 units from her home dose 20 units  Hypertension  Continue  home medication metoprolol and desmopressin  Chronic history of anxiety continue home medication Effexor, Xanax as needed  All the records are reviewed and case discussed with ED provider. Management plans discussed with the patient, family and they are in agreement.  CODE STATUS: fc  TOTAL TIME TAKING CARE OF THIS PATIENT: 43 minutes.   Note: This dictation was prepared with Dragon dictation along with smaller phrase technology. Any transcriptional errors that result from this process are unintentional.  Nicholes Mango M.D on 09/10/2018 at 4:22 PM  Between 7am to 6pm - Pager - [phone]  After 6pm go to [email or web address] - password [email or web address] [facility]  [place] [facility]  Office  [phone]  CC: Primary care physician; Robyn Haber, MD

## 2018-09-10 NOTE — ED Provider Notes (Signed)
[facility] Emergency Department Provider Note  ____________________________________________   I have reviewed the triage vital signs and the nursing notes. Where available I have reviewed prior notes and, if possible and indicated, outside hospital notes.    HISTORY  Chief Complaint Abdominal Pain    HPI Jessica Donovan is a 72 y.o. female who presents today complaining of dysuria urinary frequency and fever since yesterday.  She does have a history of left-sided CVA with weakness, she states that she is been unsteady on her feet since even started having worsening UTI symptoms.  She was given Bactrim "a few days ago" but still feels dysuria and fever.  She does have a history of urinary tract infection she states that she is on chronic nitrofurantoin.  She denies any headache or stiff neck, she states she feels weak and lightheaded and sometimes when she walks she feels unsteady on her feet as result of this infection.  She denies any other complaints. He has no chest pain.  She states her whole body hurts sometimes" but this is not particularly eliminating.  There is no focal chest pain or shortness of breath.  She does complain of bilateral flank pain more on the left than right.  But this is a chronic thing she believes.  Past Medical History:  Diagnosis Date  . Anxiety   . Chronic back pain   . Collagen vascular disease (HCC)   . Coronary artery disease    a. s/p PCI/DES to dRCA & mLCx in 2014 with repeat LHC in 10/2015 showing patent stents  . Diabetes mellitus without complication (HCC)   . Hypertension   . Low back pain   . Myocardial infarction (HCC)   . Osteomyelitis of toe (HCC) 01/23/2016  . Stroke [facility](HCC)     Patient Active Problem List   Diagnosis Date Noted  . Acute encephalopathy 07/06/2018  . Anxiety 06/10/2017  . Chest pain with moderate risk for cardiac etiology 06/09/2017  . UTI (urinary tract infection) 05/19/2017  . CAD (coronary artery  disease) 05/19/2017  . Chronic narcotic use 05/19/2017  . Cellulitis in diabetic foot (HCC) 05/19/2017  . Opioid overdose (HCC) 02/23/2017  . Altered mental status 02/23/2017  . Acute colitis 01/19/2017  . Sepsis (HCC) 01/17/2017  . Cellulitis of fourth toe of right foot 04/10/2016  . Somnolence 04/09/2016  . Dementia 04/09/2016  . Cellulitis 04/03/2016  . Hematoma of right parietal scalp 02/23/2016  . Multiple falls 02/23/2016  . Physical debility 02/23/2016  . Type II diabetes mellitus, uncontrolled (HCC) 02/23/2016  . Syncope and collapse 02/23/2016  . Polypharmacy 02/23/2016  . Osteomyelitis of toe (HCC) 01/23/2016  . Rhabdomyolysis 12/07/2015  . ARF (acute renal failure) (HCC) 11/29/2015  . Hypotension 11/29/2015  . 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 Angina effort 11/07/2015  . Osteomyelitis (HCC) 11/07/2015  . Anemia due to chronic blood loss 06/14/2015  . Generalized anxiety disorder 06/14/2015  . Lumbago with sciatica 06/14/2015  . 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  . 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 Abnormal weight loss 05/18/2014  . Chronic pain syndrome 05/18/2014  . 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 Diabetic polyneuropathy (HCC) 05/18/2014  . Type 2 diabetes mellitus with diabetic neuropathy (HCC) 05/07/2014  . Hemorrhage of rectum and anus 01/17/2014  . Cellulitis and abscess of foot excluding toe 05/18/2012    Past Surgical History:  Procedure Laterality Date  . AMPUTATION TOE Left 11/10/2015   Procedure: AMPUTATION TOE;  Surgeon: Linus Galas, MD;  Location: [facility] ORS;  Service: Podiatry;  Laterality: Left;  . AMPUTATION TOE Left 01/24/2016   Procedure: AMPUTATION TOE (2nd mpj);  Surgeon: Linus Galas, DPM;  Location: [facility] ORS;  Service: Podiatry;  Laterality: Left;  . CARDIAC CATHETERIZATION N/A 11/12/2015    Procedure: Left Heart Cath and Coronary Angiography;  Surgeon: Marcina Millard, MD;  Location: [facility] INVASIVE CV LAB;  Service: Cardiovascular;  Laterality: N/A;  . COLONOSCOPY WITH PROPOFOL N/A 07/20/2017   Procedure: COLONOSCOPY WITH PROPOFOL;  Surgeon: Wyline Mood, MD;  Location: [facility] ENDOSCOPY;  Service: Endoscopy;  Laterality: N/A;  . JOINT REPLACEMENT    . TOE AMPUTATION      Prior to Admission medications   Medication Sig Start Date End Date Taking? Authorizing Provider  acetaminophen (TYLENOL) 500 MG tablet Take 1,000 mg by mouth every 8 (eight) hours as needed for mild pain or headache.    Yes [provider]  ALPRAZolam Prudy Feeler) 1 MG tablet Take 1 mg by mouth 3 (three) times daily.  02/08/17  Yes [provider]  aspirin EC 81 MG tablet Take 81 mg by mouth daily.   Yes [provider]  atorvastatin (LIPITOR) 20 MG tablet Take 20 mg by mouth at bedtime.    Yes [provider]  Biotin 5 MG CAPS Take 5 mg by mouth daily.    Yes [provider]  celecoxib (CELEBREX) 200 MG capsule Take 200 mg by mouth daily.    Yes [provider]  diphenoxylate-atropine (LOMOTIL) 2.5-0.025 MG tablet Take 1 tablet by mouth every 4 (four) hours as needed for diarrhea or loose stools.    Yes [provider]  ferrous sulfate 325 (65 FE) MG tablet Take 325 mg by mouth daily with breakfast.   Yes [provider]  fesoterodine (TOVIAZ) 8 MG TB24 tablet Take 8 mg by mouth daily.   Yes [provider]  insulin aspart (NOVOLOG) 100 UNIT/ML injection Inject 3-15 Units into the skin 3 (three) times daily with meals as needed for high blood sugar. Pt uses as needed per sliding scale:    Less than 140:  0 units  140-180:  3 units 181-220:  4 units 221- 260:  6 units 261- 320:  8 units 321-360:  10 units 361-400:  12 units Greater than 400:  15 units   Yes [provider]  Insulin Glargine (TOUJEO SOLOSTAR) 300 UNIT/ML  SOPN Inject 20 Units into the skin daily. 07/07/18  Yes [name], MD  isosorbide mononitrate (IMDUR) 30 MG 24 hr tablet Take 30 mg by mouth daily.    Yes [provider]  lisinopril (PRINIVIL,ZESTRIL) 20 MG tablet Take 20 mg by mouth daily.   Yes [provider]  metoprolol tartrate (LOPRESSOR) 25 MG tablet Take 25 mg by mouth 2 (two) times daily. 01/28/17  Yes [provider]  mirtazapine (REMERON) 15 MG tablet Take 15 mg by mouth at bedtime.   Yes [provider]  Multiple Vitamin (MULTIVITAMIN WITH MINERALS) TABS tablet Take 1 tablet by mouth daily.   Yes [provider]  nitrofurantoin (MACRODANTIN) 100 MG capsule Take 100 mg by mouth daily.   Yes [provider]  nitroGLYCERIN (NITROSTAT) 0.4 MG SL tablet Place 0.4 mg under the tongue every 5 (five) minutes as needed for chest pain. Reported on 04/16/2016   Yes [provider]  omeprazole (PRILOSEC) 20  MG capsule Take 20 mg by mouth daily.   Yes [provider]  oxybutynin (DITROPAN-XL) 10 MG 24 hr tablet oxybutynin chloride ER 10 mg tablet,extended release 24 hr   Yes [provider]  sulfamethoxazole-trimethoprim (BACTRIM DS,SEPTRA DS) 800-160 MG tablet Take 1 tablet by mouth 2 (two) times daily. 09/09/18  Yes [provider]  traZODone (DESYREL) 100 MG tablet Take 100 mg by mouth at bedtime. 01/28/17  Yes [provider]  venlafaxine XR (EFFEXOR-XR) 75 MG 24 hr capsule Take 75 mg by mouth daily with breakfast.    Yes [provider]  vitamin B-12 (CYANOCOBALAMIN) 1000 MCG tablet Take 1,000 mcg by mouth daily.   Yes [provider]  Vitamin D, Ergocalciferol, (DRISDOL) 50000 units CAPS capsule Take 50,000 Units by mouth every Sunday.    Yes [provider]  cyclobenzaprine (FLEXERIL) 10 MG tablet Take 1 tablet (10 mg total) by mouth 3 (three) times daily as needed for muscle spasms. 01/27/16   Enid Baas, MD  morphine  (MS CONTIN) 15 MG 12 hr tablet Take 1 tablet (15 mg total) by mouth every 12 (twelve) hours. Patient not taking: Reported on 09/10/2018 05/23/17   Auburn Bilberry, MD  polyethylene glycol powder [facility]) powder 17 grams as needed each time for constpation Patient not taking: Reported on 07/19/2018 06/24/17   Wyline Mood, MD  pregabalin (LYRICA) 150 MG capsule Take 1 capsule (150 mg total) by mouth 2 (two) times daily. Patient not taking: Reported on 09/10/2018 01/19/17   Milagros Loll, MD    Allergies Codeine; Flu virus vaccine; Influenza vaccines; Methadone; Oxycodone-acetaminophen; Percocet [oxycodone-acetaminophen]; Tetanus antitoxin; Tetanus toxoids; Penicillin g; and Tetracyclines & related  Family History  Problem Relation Age of Onset  . CAD Mother   . CAD Father     Social History Social History   Tobacco Use  . Smoking status: Never Smoker  . Smokeless tobacco: Never Used  Substance Use Topics  . Alcohol use: No  . Drug use: No    Review of Systems Constitutional: Positive fever/chills Eyes: No visual changes. ENT: No sore throat. No stiff neck no neck pain Cardiovascular: Denies chest pain. Respiratory: Denies shortness of breath. Gastrointestinal: Positive nausea no diarrhea.  No constipation. Genitourinary: Positive for dysuria. Musculoskeletal: Negative lower extremity swelling Skin: Negative for rash. Neurological: Negative for severe headaches, focal weakness or numbness.   ____________________________________________   PHYSICAL EXAM:  VITAL SIGNS: ED Triage Vitals  Enc Vitals Group     BP 09/10/18 1326 (!) 125/59     Pulse Rate 09/10/18 1326 (!) 111     Resp 09/10/18 1326 20     Temp 09/10/18 1326 (!) 100.9 F (38.3 C)     Temp src --      SpO2 09/10/18 1326 91 %     Weight 09/10/18 1327 174 lb (78.9 kg)     Height 09/10/18 1327 5\' 5"  (1.651 m)     Head Circumference --      Peak Flow --      Pain Score 09/10/18 1327 6     Pain Loc --       Pain Edu? --      Excl. in GC? --     Constitutional: Alert and oriented. Well appearing and in no acute distress. Eyes: Conjunctivae are normal Head: Atraumatic HEENT: No congestion/rhinnorhea. Mucous membranes are moist.  Oropharynx non-erythematous Neck:   Nontender with no meningismus, no masses, no stridor Cardiovascular: Normal rate, regular rhythm. Grossly normal  heart sounds.  Good peripheral circulation. Respiratory: Normal respiratory effort.  No retractions. Lungs CTAB. Abdominal: Soft and nontender. No distention. No guarding no rebound Back:  There is no focal tenderness or step off.  there is no midline tenderness there are no lesions noted. there is left CVA tenderness Musculoskeletal: No lower extremity tenderness, no upper extremity tenderness. No joint effusions, no DVT signs strong distal pulses no edema Neurologic:  Normal speech and language.  Sided facial droop noted.  Neck. Skin:  Skin is warm, dry and intact. No rash noted. Psychiatric: Mood and affect are normal. Speech and behavior are normal.  ____________________________________________   LABS (all labs ordered are listed, but only abnormal results are displayed)  Labs Reviewed  COMPREHENSIVE METABOLIC PANEL - Abnormal; Notable for the following components:      Result Value   Glucose, Bld 222 (*)    BUN 26 (*)    Creatinine, Ser 1.04 (*)    GFR calc non Af Amer 52 (*)    All other components within normal limits  LACTIC ACID, PLASMA - Abnormal; Notable for the following components:   Lactic Acid, Venous 2.1 (*)    All other components within normal limits  URINALYSIS, COMPLETE (UACMP) WITH MICROSCOPIC - Abnormal; Notable for the following components:   Color, Urine YELLOW (*)    APPearance CLOUDY (*)    Glucose, UA 50 (*)    Protein, ur 30 (*)    Leukocytes, UA MODERATE (*)    WBC, UA >50 (*)    All other components within normal limits  CULTURE, BLOOD (ROUTINE X 2)  CULTURE, BLOOD (ROUTINE  X 2)  URINE CULTURE  CBC WITH DIFFERENTIAL/PLATELET  PROTIME-INR  LACTIC ACID, PLASMA    Pertinent labs  results that were available during my care of the patient were reviewed by me and considered in my medical decision making (see chart for details). ____________________________________________  EKG  I personally interpreted any EKGs ordered by me or triage Rate 112 bpm, sinus tachycardia, no acute ST elevation or depression, left axis deviation ____________________________________________  RADIOLOGY  Pertinent labs & imaging results that were available during my care of the patient were reviewed by me and considered in my medical decision making (see chart for details). If possible, patient and/or family made aware of any abnormal findings.  Dg Chest 2 View  Result Date: 09/10/2018 CLINICAL DATA:  Fever, tachycardia EXAM: CHEST - 2 VIEW COMPARISON:  08/15/2018 FINDINGS: Very low lung volumes resulting in vascular crowding and basilar atelectasis. Stable cardiomegaly. No large effusion, significant collapse or consolidation. No pneumothorax. Trachea midline. Aorta is atherosclerotic. IMPRESSION: Very low volume exam with vascular crowding and basilar atelectasis. Electronically Signed   By: Judie Petit.  Shick M.D.   On: 09/10/2018 14:13   Ct Head Wo Contrast  Result Date: 09/10/2018 CLINICAL DATA:  72 year old female with ataxia, multiple recent falls and head injuries. EXAM: CT HEAD WITHOUT CONTRAST TECHNIQUE: Contiguous axial images were obtained from the base of the skull through the vertex without intravenous contrast. COMPARISON:  07/06/2018 and prior CTs FINDINGS: Brain: No evidence of acute infarction, hemorrhage, hydrocephalus, extra-axial collection or mass lesion/mass effect. Mild atrophy and mild chronic small-vessel white matter ischemic changes again noted. Vascular: Carotid atherosclerotic calcifications again identified. Skull: Normal. Negative for fracture or focal lesion.  Sinuses/Orbits: No acute finding. Other: None. IMPRESSION: 1. No evidence of acute intracranial abnormality 2. Mild atrophy and chronic small-vessel white matter ischemic changes. Electronically Signed   By: Harmon Pier  M.D.   On: 09/10/2018 15:00   Ct Renal Stone Study  Result Date: 09/10/2018 CLINICAL DATA:  72 year old female with acute abdominal and pelvic pain with fever. EXAM: CT ABDOMEN AND PELVIS WITHOUT CONTRAST TECHNIQUE: Multidetector CT imaging of the abdomen and pelvis was performed following the standard protocol without IV contrast. COMPARISON:  06/22/2017 and prior CTs. FINDINGS: Please note that parenchymal abnormalities may be missed without intravenous contrast. Lower chest: No acute abnormality. Coronary artery calcifications again identified. Hepatobiliary: The liver is unremarkable. The patient is status post cholecystectomy. No biliary dilatation. Pancreas: Atrophic without other significant abnormality Spleen: Unremarkable Adrenals/Urinary Tract: The kidneys, bladder and adrenal glands are unremarkable. Stomach/Bowel: Stomach is within normal limits. No evidence of bowel wall thickening, distention, or inflammatory changes. A moderate to large amount of colonic stool is noted. Vascular/Lymphatic: Aortic atherosclerosis. No enlarged abdominal or pelvic lymph nodes. Reproductive: Status post hysterectomy. No adnexal masses. Other: No ascites, focal collection or pneumoperitoneum. A small umbilical hernia containing fat is again noted. Musculoskeletal: Multilevel degenerative changes throughout the lumbar spine noted. No acute or suspicious bony abnormalities noted. IMPRESSION: 1. No acute abnormality 2. Moderate to large amount of colonic stool. No evidence of bowel wall thickening, inflammation or bowel obstruction. 3. Multilevel lumbar spine degenerative changes again noted. 4.  Aortic Atherosclerosis (ICD10-I70.0). Electronically Signed   By: Harmon Pier M.D.   On: 09/10/2018 14:57    ____________________________________________    PROCEDURES  Procedure(s) performed: None  Procedures  Critical Care performed: None  ____________________________________________   INITIAL IMPRESSION / ASSESSMENT AND PLAN / ED COURSE  Pertinent labs & imaging results that were available during my care of the patient were reviewed by me and considered in my medical decision making (see chart for details).  Patient here with urinary tract infection feeling generally weak and unwell.  Because she states that she is been falling I did a CT of her head which is negative for injury or stroke.  Patient's does also have abdominal pain CT scan is reassuring as is her abdominal exam.  However, she does have what appears to be partially treated urinary tract infection with leukocytes and more than 50 white cells and fever.  Probably pyelonephritis as well.  We are giving her Rocephin pending cultures.  Patient is not allergic to Rocephin.  We will admit her to the hospital.  We are giving her IV fluid as well.  No evidence that she is truly septic, for I will not give her 30 mix per cake of fluid.   ____________________________________________   FINAL CLINICAL IMPRESSION(S) / ED DIAGNOSES  Final diagnoses:  Weakness      This chart was dictated using voice recognition software.  Despite best efforts to proofread,  errors can occur which can change meaning.      Jeanmarie Plant, MD 09/10/18 1544

## 2018-09-10 NOTE — ED Triage Notes (Signed)
Pt reports kidney stone 2 days ago, hematuria yesterday so went to urgent care. Dx with UTI and started abx yesterday.  Pt in triage having slurred speech.  Has left eye drooping and some facial drooping that is from previous CVA.  Has fallen twice due to off balance today.  Feels "drunk"/lightheaded per pt.  These sx started yesterday AM. Speech worse today per friend.  Discussed with robinson, sepsis work up to start and wait for MD eval to see if CT needed.

## 2018-09-10 NOTE — ED Notes (Signed)
Pt in X ray

## 2018-09-10 NOTE — Progress Notes (Signed)
Family Meeting Note  Advance Directive:yes  Today a meeting took place with the Patient.    The following clinical team members were present during this meeting:MD  The following were discussed:Patient's diagnosis: Sepsis, urinary tract infection failed outpatient antibiotics.  Acute kidney injury, other comorbidities as documented below, treatment plan of care discussed in detail with the patient.  She verbalized understanding of the plan.     Anxiety   . Chronic back pain   . Collagen vascular disease (HCC)   . Coronary artery disease    a. s/p PCI/DES to dRCA & mLCx in 2014 with repeat LHC in 10/2015 showing patent stents  . Diabetes mellitus without complication (HCC)   . Hypertension   . Low back pain   . Myocardial infarction (HCC)   . Osteomyelitis of toe (HCC) 01/23/2016  . Stroke [facility](HCC)       Patient's progosis: Unable to determine and Goals for treatment: Full Code, healthcare power of attorney patient's) Jessica Donovan  Additional follow-up to be provided: Hospitalist  Time spent during discussion:17 min  Jessica LabAruna Dimetrius Montfort, MD

## 2018-09-10 NOTE — ED Notes (Signed)
Pt gone to CT 

## 2018-09-11 LAB — GLUCOSE, CAPILLARY
GLUCOSE-CAPILLARY: 141 mg/dL — AB (ref 70–99)
GLUCOSE-CAPILLARY: 249 mg/dL — AB (ref 70–99)
GLUCOSE-CAPILLARY: 158 mg/dL — AB (ref 70–99)
GLUCOSE-CAPILLARY: 184 mg/dL — AB (ref 70–99)

## 2018-09-11 LAB — PROCALCITONIN: Procalcitonin: 0.1 ng/mL

## 2018-09-11 MED ORDER — SODIUM CHLORIDE 0.9% FLUSH
3.0000 mL | Freq: Two times a day (BID) | INTRAVENOUS | Status: DC
  Administered 2018-09-11 – 2018-09-13 (×5): 3 mL via INTRAVENOUS

## 2018-09-11 MED ORDER — SODIUM CHLORIDE 0.9% FLUSH
3.0000 mL | INTRAVENOUS | Status: DC | PRN
  Administered 2018-09-11: 15:00:00 3 mL via INTRAVENOUS

## 2018-09-11 NOTE — Progress Notes (Signed)
MD visit with pt request for DNR status. DNR order obtained with purple bracelet placed per pt request.

## 2018-09-11 NOTE — Progress Notes (Signed)
Upon pt review, Pt states she is DNR and old form at home that needs to be updated. Paged Dr. Esaw GrandchildVachanni with pt request for DNR status and updated DNR form for home.

## 2018-09-11 NOTE — Progress Notes (Signed)
Family Meeting Note  Advance Directive:yes  Today a meeting took place with the Patient.  The following clinical team members were present during this meeting:MD  The following were discussed:Patient's diagnosis: Stroke, HTn, UTI, Patient's progosis: Unable to determine and Goals for treatment: DNR   Pt told me, she will like to be DNR and want to do the paperwork. Will call chaplin  Additional follow-up to be provided: PMD  Time spent during discussion:20 minutes  Altamese DillingVaibhavkumar Jezebelle Ledwell, MD

## 2018-09-11 NOTE — Progress Notes (Signed)
Has slept at long intervals. Tolerating diet well. Demonstrated limited recall of information and time span. Reports she is nauseated and then ask for milk and ate her meals. Saving leftover food to send home to dog. Neighbor her assisting her with menu and pt needs. Reports chronic back pain. Purewick in place for urinary incontinence. No identified concerns. VSS.

## 2018-09-11 NOTE — Progress Notes (Signed)
Pt resting in bed asking for pain meds pain, Nausea  meds given and explained to pt that meds were not due  yet it would be a little while before she could have them again. Pt awake and  in the next 5 seconds Chaplin in to see pt last night and mentioned to me that pt was awake and in the next 3 seconds she  was asleep

## 2018-09-11 NOTE — Progress Notes (Signed)
[facility]   PATIENT NAME: Jessica Donovan    MR#:  [id number]  DATE OF BIRTH:  01/21/1946  SUBJECTIVE:  CHIEF COMPLAINT:   Chief Complaint  Patient presents with  . Abdominal Pain   Treated for UTI but came with worsening symptoms. Feels better today.  REVIEW OF SYSTEMS:  CONSTITUTIONAL: No fever,have fatigue or weakness.  EYES: No blurred or double vision.  EARS, NOSE, AND THROAT: No tinnitus or ear pain.  RESPIRATORY: No cough, shortness of breath, wheezing or hemoptysis.  CARDIOVASCULAR: No chest pain, orthopnea, edema.  GASTROINTESTINAL: No nausea, vomiting, diarrhea or abdominal pain.  GENITOURINARY: No dysuria, hematuria.  ENDOCRINE: No polyuria, nocturia,  HEMATOLOGY: No anemia, easy bruising or bleeding SKIN: No rash or lesion. MUSCULOSKELETAL: No joint pain or arthritis.   NEUROLOGIC: No tingling, numbness, weakness.  PSYCHIATRY: No anxiety or depression.   ROS  DRUG ALLERGIES:   Allergies  Allergen Reactions  . Codeine Nausea And Vomiting  . Flu Virus Vaccine     Other reaction(s): Other (See Comments) Passed out for 12 days  . Influenza Vaccines Other (See Comments)    Reaction:  Caused pt to pass out   . Methadone Hives and Itching  . Oxycodone-Acetaminophen Nausea And Vomiting  . Percocet [Oxycodone-Acetaminophen] Nausea And Vomiting  . Tetanus Antitoxin   . Tetanus Toxoids Swelling and Other (See Comments)    Reaction:  Swelling at injection site  . Penicillin G Rash    Has patient had a PCN reaction causing immediate rash, facial/tongue/throat swelling, SOB or lightheadedness with hypotension: Yes Has patient had a PCN reaction causing severe rash involving mucus membranes or skin necrosis: No Has patient had a PCN reaction that required hospitalization: No Has patient had a PCN reaction occurring within the last 10 years: No If all of the above answers are "NO", then may proceed with Cephalosporin use..  .  Tetracyclines & Related Rash    VITALS:  Blood pressure 135/64, pulse 76, temperature 98.5 F (36.9 C), temperature source Oral, resp. rate 18, height 5\' 5"  (1.651 m), weight 78.9 kg, SpO2 94 %.  PHYSICAL EXAMINATION:  GENERAL:  72 y.o.-year-old patient lying in the bed with no acute distress.  EYES: Pupils equal, round, reactive to light and accommodation. No scleral icterus. Extraocular muscles intact.  HEENT: Head atraumatic, normocephalic. Oropharynx and nasopharynx clear.  NECK:  Supple, no jugular venous distention. No thyroid enlargement, no tenderness.  LUNGS: Normal breath sounds bilaterally, no wheezing, rales,rhonchi or crepitation. No use of accessory muscles of respiration.  CARDIOVASCULAR: S1, S2 normal. No murmurs, rubs, or gallops.  ABDOMEN: Soft, nontender, nondistended. Bowel sounds present. No organomegaly or mass.  EXTREMITIES: No pedal edema, cyanosis, or clubbing.  NEUROLOGIC: Cranial nerves II through XII are intact. Muscle strength 5/5 in right extremities- 4/5 in left side. Sensation intact. Gait not checked.  PSYCHIATRIC: The patient is alert and oriented x 3.  SKIN: No obvious rash, lesion, or ulcer.   Physical Exam LABORATORY PANEL:   CBC Recent Labs  Lab 09/10/18 1659  WBC 8.1  HGB 13.1  HCT 37.8  PLT 176   ------------------------------------------------------------------------------------------------------------------  Chemistries  Recent Labs  Lab 09/10/18 1345 09/10/18 1659  NA 135  --   K 4.9  --   CL 102  --   CO2 24  --   GLUCOSE 222*  --   BUN 26*  --   CREATININE 1.04* 0.96  CALCIUM 9.3  --   AST  20  --   ALT 11  --   ALKPHOS 82  --   BILITOT 0.5  --    ------------------------------------------------------------------------------------------------------------------  Cardiac Enzymes No results for input(s): TROPONINI in the last 168  hours. ------------------------------------------------------------------------------------------------------------------  RADIOLOGY:  Dg Chest 2 View  Result Date: 09/10/2018 CLINICAL DATA:  Fever, tachycardia EXAM: CHEST - 2 VIEW COMPARISON:  08/15/2018 FINDINGS: Very low lung volumes resulting in vascular crowding and basilar atelectasis. Stable cardiomegaly. No large effusion, significant collapse or consolidation. No pneumothorax. Trachea midline. Aorta is atherosclerotic. IMPRESSION: Very low volume exam with vascular crowding and basilar atelectasis. Electronically Signed   By: Judie Petit.  Shick M.D.   On: 09/10/2018 14:13   Ct Head Wo Contrast  Result Date: 09/10/2018 CLINICAL DATA:  72 year old male with ataxia, multiple recent falls and head injuries. EXAM: CT HEAD WITHOUT CONTRAST TECHNIQUE: Contiguous axial images were obtained from the base of the skull through the vertex without intravenous contrast. COMPARISON:  07/06/2018 and prior CTs FINDINGS: Brain: No evidence of acute infarction, hemorrhage, hydrocephalus, extra-axial collection or mass lesion/mass effect. Mild atrophy and mild chronic small-vessel white matter ischemic changes again noted. Vascular: Carotid atherosclerotic calcifications again identified. Skull: Normal. Negative for fracture or focal lesion. Sinuses/Orbits: No acute finding. Other: None. IMPRESSION: 1. No evidence of acute intracranial abnormality 2. Mild atrophy and chronic small-vessel white matter ischemic changes. Electronically Signed   By: Harmon Pier M.D.   On: 09/10/2018 15:00   Ct Renal Stone Study  Result Date: 09/10/2018 CLINICAL DATA:  72 year old male with acute abdominal and pelvic pain with fever. EXAM: CT ABDOMEN AND PELVIS WITHOUT CONTRAST TECHNIQUE: Multidetector CT imaging of the abdomen and pelvis was performed following the standard protocol without IV contrast. COMPARISON:  06/22/2017 and prior CTs. FINDINGS: Please note that parenchymal  abnormalities may be missed without intravenous contrast. Lower chest: No acute abnormality. Coronary artery calcifications again identified. Hepatobiliary: The liver is unremarkable. The patient is status post cholecystectomy. No biliary dilatation. Pancreas: Atrophic without other significant abnormality Spleen: Unremarkable Adrenals/Urinary Tract: The kidneys, bladder and adrenal glands are unremarkable. Stomach/Bowel: Stomach is within normal limits. No evidence of bowel wall thickening, distention, or inflammatory changes. A moderate to large amount of colonic stool is noted. Vascular/Lymphatic: Aortic atherosclerosis. No enlarged abdominal or pelvic lymph nodes. Reproductive: Status post hysterectomy. No adnexal masses. Other: No ascites, focal collection or pneumoperitoneum. A small umbilical hernia containing fat is again noted. Musculoskeletal: Multilevel degenerative changes throughout the lumbar spine noted. No acute or suspicious bony abnormalities noted. IMPRESSION: 1. No acute abnormality 2. Moderate to large amount of colonic stool. No evidence of bowel wall thickening, inflammation or bowel obstruction. 3. Multilevel lumbar spine degenerative changes again noted. 4.  Aortic Atherosclerosis (ICD10-I70.0). Electronically Signed   By: Harmon Pier M.D.   On: 09/10/2018 14:57    ASSESSMENT AND PLAN:   Active Problems:   Sepsis (HCC)  * Sepsis   with fever, tachycardia and elevated lactic acid at the time of admission  Urine culture and sensitivity and blood cultures are obtained Monitor lactic acid level check procalcitonin IV Rocephin and IV fluids   * Acute cystitis failed outpatient antibiotics Urine culture and sensitivity ordered and patient is started on IV Rocephin.  Follow-up on the cultures including blood culture and sensitivity  * Acute kidney injury prerenal from poor p.o. Intake IV fluids and avoid nephrotoxins and monitor renal function closely  * Moderate to severe  constipation Senokot, MiraLAX, Dulcolax suppository Enema as needed  *  Diabetes mellitus Sliding scale insulin Decreased home dose of glargine to 12 units from her home dose 20 units  * Hypertension  Continue home medication metoprolol and desmopressin  * Chronic history of anxiety continue home medication Effexor, Xanax as needed    All the records are reviewed and case discussed with Care Management/Social Workerr. Management plans discussed with the patient, family and they are in agreement.  CODE STATUS: DNR  TOTAL TIME TAKING CARE OF THIS PATIENT: 35 minutes.     POSSIBLE D/C IN 1-2 DAYS, DEPENDING ON CLINICAL CONDITION.   Altamese DillingVaibhavkumar Darryn Kydd M.D on 09/11/2018   Between 7am to 6pm - Pager - [phone]  After 6pm go to [email or web address] - password [email or web address]  [facility]  Office  [phone]  CC: Primary care physician; Garlon HatchetMcConville, Robert, MD  Note: This dictation was prepared with Dragon dictation along with smaller phrase technology. Any transcriptional errors that result from this process are unintentional.

## 2018-09-12 ENCOUNTER — Inpatient Hospital Stay: Payer: PPO

## 2018-09-12 LAB — GLUCOSE, CAPILLARY
GLUCOSE-CAPILLARY: 156 mg/dL — AB (ref 70–99)
Glucose-Capillary: 189 mg/dL — ABNORMAL HIGH (ref 70–99)
Glucose-Capillary: 149 mg/dL — ABNORMAL HIGH (ref 70–99)
Glucose-Capillary: 186 mg/dL — ABNORMAL HIGH (ref 70–99)

## 2018-09-12 LAB — HEMOGLOBIN A1C
HEMOGLOBIN A1C: 7.4 % — AB (ref 4.8–5.6)
Mean Plasma Glucose: 166 mg/dL

## 2018-09-12 LAB — URINE CULTURE: Culture: NO GROWTH

## 2018-09-12 LAB — PROCALCITONIN: Procalcitonin: <0.1 ng/mL

## 2018-09-12 MED ORDER — BISACODYL 10 MG RE SUPP
10.0000 mg | Freq: Once | RECTAL | Status: DC
  Filled 2018-09-12: qty 1

## 2018-09-12 MED ORDER — IOHEXOL 300 MG/ML  SOLN
75.0000 mL | Freq: Once | INTRAMUSCULAR | Status: AC | PRN
  Administered 2018-09-12: 75 mL via INTRAVENOUS

## 2018-09-12 MED ORDER — ACETAMINOPHEN 500 MG PO TABS: 1000.0000 mg | ORAL_TABLET | Freq: Three times a day (TID) | ORAL | Status: DC | PRN

## 2018-09-12 NOTE — Plan of Care (Signed)

## 2018-09-12 NOTE — Progress Notes (Signed)
Pt states pain is 8/10, appears to be sleeping comfortably. Requests pain medication but told that it is scheduled during the day.

## 2018-09-12 NOTE — Progress Notes (Signed)
Flutter education complete. Pt understands use and technique. Pt independent with use.

## 2018-09-12 NOTE — Progress Notes (Signed)
   09/12/18 0945  Clinical Encounter Type  Visited With Patient  Visit Type Initial;Spiritual support  Referral From Nurse  Consult/Referral To Chaplain  Spiritual Encounters  Spiritual Needs Brochure;Emotional;Other (Comment)   CH received an OR to educate the patient on the AD process. As I entered the patient's room I discovered the Ms. Wentzell' RN caring for her so I asked to share with the patient the AD process and leave paperwork. The RN's phone rang and this gave me the break needed to educate the patient on the AD process. I will follow up as needed.

## 2018-09-12 NOTE — Progress Notes (Signed)
[facility] [place] [facility]   PATIENT NAME: Jessica Donovan    MR#:  [id number]  DATE OF BIRTH:  Jun 15, 1946  SUBJECTIVE:  CHIEF COMPLAINT:   Chief Complaint  Patient presents with  . Abdominal Pain   Treated for UTI but came with worsening symptoms. Feels better today. Her complaint of left shoulder pain and stated she is feeling overall weak and not ready to go home today.  REVIEW OF SYSTEMS:  CONSTITUTIONAL: No fever,have fatigue or weakness.  EYES: No blurred or double vision.  EARS, NOSE, AND THROAT: No tinnitus or ear pain.  RESPIRATORY: No cough, shortness of breath, wheezing or hemoptysis.  CARDIOVASCULAR: No chest pain, orthopnea, edema.  GASTROINTESTINAL: No nausea, vomiting, diarrhea or abdominal pain.  GENITOURINARY: No dysuria, hematuria.  ENDOCRINE: No polyuria, nocturia,  HEMATOLOGY: No anemia, easy bruising or bleeding SKIN: No rash or lesion. MUSCULOSKELETAL: No joint pain or arthritis.   NEUROLOGIC: No tingling, numbness, weakness.  PSYCHIATRY: No anxiety or depression.   ROS  DRUG ALLERGIES:   Allergies  Allergen Reactions  . Codeine Nausea And Vomiting  . Flu Virus Vaccine     Other reaction(s): Other (See Comments) Passed out for 12 days  . Influenza Vaccines Other (See Comments)    Reaction:  Caused pt to pass out   . Methadone Hives and Itching  . Oxycodone-Acetaminophen Nausea And Vomiting  . Percocet [Oxycodone-Acetaminophen] Nausea And Vomiting  . Tetanus Antitoxin   . Tetanus Toxoids Swelling and Other (See Comments)    Reaction:  Swelling at injection site  . Penicillin G Rash    Has patient had a PCN reaction causing immediate rash, facial/tongue/throat swelling, SOB or lightheadedness with hypotension: Yes Has patient had a PCN reaction causing severe rash involving mucus membranes or skin necrosis: No Has patient had a PCN reaction that required hospitalization: No Has patient had a PCN reaction occurring within  the last 10 years: No If all of the above answers are "NO", then may proceed with Cephalosporin use..  . Tetracyclines & Related Rash    VITALS:  Blood pressure (!) 132/58, pulse 75, temperature 97.9 F (36.6 C), temperature source Oral, resp. rate 17, height 5\' 5"  (1.651 m), weight 78.9 kg, SpO2 91 %.  PHYSICAL EXAMINATION:  GENERAL:  72 y.o.-year-old patient lying in the bed with no acute distress.  EYES: Pupils equal, round, reactive to light and accommodation. No scleral icterus. Extraocular muscles intact.  HEENT: Head atraumatic, normocephalic. Oropharynx and nasopharynx clear.  NECK:  Supple, no jugular venous distention. No thyroid enlargement, no tenderness.  LUNGS: Normal breath sounds bilaterally, no wheezing, rales,rhonchi or crepitation. No use of accessory muscles of respiration.  CARDIOVASCULAR: S1, S2 normal. No murmurs, rubs, or gallops.  ABDOMEN: Soft, nontender, nondistended. Bowel sounds present. No organomegaly or mass.  EXTREMITIES: No pedal edema, cyanosis, or clubbing. Left arm on the lateral side there is some fluctuating soft structure which feels like same consistency as her muscles and it is not in constant shape, possibly distorted muscle tissue.  No enlargement on her axillary or supraclavicular lymph nodes. NEUROLOGIC: Cranial nerves II through XII are intact. Muscle strength 5/5 in right extremities- 4/5 in left side. Sensation intact. Gait not checked.  PSYCHIATRIC: The patient is alert and oriented x 3.  SKIN: No obvious rash, lesion, or ulcer.   Physical Exam LABORATORY PANEL:   CBC Recent Labs  Lab 09/10/18 1659  WBC 8.1  HGB 13.1  HCT 37.8  PLT 176   ------------------------------------------------------------------------------------------------------------------  Chemistries  Recent Labs  Lab 09/10/18 1345 09/10/18 1659  NA 135  --   K 4.9  --   CL 102  --   CO2 24  --   GLUCOSE 222*  --   BUN 26*  --   CREATININE 1.04* 0.96   CALCIUM 9.3  --   AST 20  --   ALT 11  --   ALKPHOS 82  --   BILITOT 0.5  --    ------------------------------------------------------------------------------------------------------------------  Cardiac Enzymes No results for input(s): TROPONINI in the last 168 hours. ------------------------------------------------------------------------------------------------------------------  RADIOLOGY:  Ct Humerus Left W Contrast  Result Date: 09/12/2018 CLINICAL DATA:  Left humeral mass for 6 months. EXAM: CT OF THE UPPER LEFT EXTREMITY WITH CONTRAST TECHNIQUE: Multidetector CT imaging of the upper left extremity was performed according to the standard protocol following intravenous contrast administration. COMPARISON:  None. CONTRAST:  75mL OMNIPAQUE IOHEXOL 300 MG/ML  SOLN FINDINGS: Bones/Joint/Cartilage No fracture or dislocation. Normal alignment. No joint effusion. No aggressive osseous lesion. Mild arthropathy of the acromioclavicular joint. Moderate osteoarthritis of the glenohumeral joint with joint space narrowing and marginal osteophytes. Ligaments Ligaments are suboptimally evaluated by CT. Muscles and Tendons Mild atrophy of the supraspinatus muscle. Remainder the muscles are normal. No intramuscular fluid collection or hematoma. Soft tissue No fluid collection or hematoma. No soft tissue mass. No soft tissue mass, fluid collection or hematoma at the site of clinical palpable abnormality in the lateral aspect of upper arm. IMPRESSION: 1. No soft tissue mass, fluid collection or hematoma at the site of clinical palpable abnormality in the lateral aspect of upper arm. 2. Moderate osteoarthritis of glenohumeral joint. Electronically Signed   By: Elige Ko   On: 09/12/2018 13:40    ASSESSMENT AND PLAN:   Active Problems:   Sepsis (HCC)  * Sepsis   with fever, tachycardia and elevated lactic acid at the time of admission  Urine culture and sensitivity and blood cultures are  obtained Monitor lactic acid level check procalcitonin IV Rocephin and IV fluids  * Acute cystitis failed outpatient antibiotics Urine culture and sensitivity ordered and patient is started on IV Rocephin.  Follow-up on the cultures including blood culture and sensitivity No growth so far.  * Acute kidney injury prerenal from poor p.o. Intake IV fluids and avoid nephrotoxins and monitor renal function closely  * Moderate to severe constipation Senokot, MiraLAX, Dulcolax suppository Enema as needed  * Diabetes mellitus Sliding scale insulin Decreased home dose of glargine to 12 units from her home dose 20 units  * Hypertension  Continue home medication metoprolol and desmopressin  * Chronic history of anxiety continue home medication Effexor, Xanax as needed  *Left shoulder pain, as per patient this is chronic complaint but it is limiting her activities and she is concerned about not able to move it to support her.  CT scan on the left arm which did not show any abnormality other than arthritis.  Advised to follow-up in orthopedic clinic.  All the records are reviewed and case discussed with Care Management/Social Workerr. Management plans discussed with the patient, family and they are in agreement.  CODE STATUS: DNR  TOTAL TIME TAKING CARE OF THIS PATIENT: 35 minutes.    POSSIBLE D/C IN 1-2 DAYS, DEPENDING ON CLINICAL CONDITION.   Altamese Dilling M.D on 09/12/2018   Between 7am to 6pm - Pager - [phone]  After 6pm go to [email or web address] - Social research officer, government  [employer]  [phone]  CC: Primary care physician; Garlon HatchetMcConville, Robert, MD  Note: This dictation was prepared with Dragon dictation along with smaller phrase technology. Any transcriptional errors that result from this process are unintentional.

## 2018-09-13 LAB — GLUCOSE, CAPILLARY
GLUCOSE-CAPILLARY: 143 mg/dL — AB (ref 70–99)
Glucose-Capillary: 269 mg/dL — ABNORMAL HIGH (ref 70–99)

## 2018-09-13 MED ORDER — MORPHINE SULFATE ER 15 MG PO TBCR: 15.0000 mg | EXTENDED_RELEASE_TABLET | Freq: Two times a day (BID) | ORAL | 0 refills | Status: DC

## 2018-09-13 MED ORDER — CEFPODOXIME PROXETIL 100 MG PO TABS: 100.0000 mg | ORAL_TABLET | Freq: Two times a day (BID) | ORAL | 0 refills | Status: AC

## 2018-09-13 MED ORDER — INSULIN GLARGINE 300 UNIT/ML ~~LOC~~ SOPN: 15.0000 [IU] | PEN_INJECTOR | Freq: Every day | SUBCUTANEOUS | 0 refills | Status: DC

## 2018-09-13 MED ORDER — LISINOPRIL 20 MG PO TABS
40.0000 mg | ORAL_TABLET | Freq: Every day | ORAL | Status: DC
  Administered 2018-09-13: 09:00:00 40 mg via ORAL
  Filled 2018-09-13 (×2): qty 2

## 2018-09-13 MED ORDER — LISINOPRIL 40 MG PO TABS: 40.0000 mg | ORAL_TABLET | Freq: Every day | ORAL | 0 refills | Status: DC

## 2018-09-13 NOTE — Care Management (Signed)
[facility]  No discharge needs identified by members of the care team

## 2018-09-13 NOTE — Care Management Important Message (Signed)
Important Message  Patient Details  Name: Jessica DanceBarbara Donovan MRN: [id number] Date of Birth: [birth year]   Medicare Important Message Given:  Yes    Olegario MessierKathy A Riely Baskett 09/13/2018, 10:42 AM

## 2018-09-13 NOTE — Discharge Summary (Signed)
[facility] [place] at [facility]   PATIENT NAME: Jessica DanceBarbara Donovan    MR#:  [id number]  DATE OF BIRTH:  November 02, 1946  DATE OF ADMISSION:  09/10/2018 ADMITTING PHYSICIAN: Ramonita LabAruna Gouru, MD  DATE OF DISCHARGE: 09/13/2018  PRIMARY CARE PHYSICIAN: Garlon HatchetMcConville, Robert, MD    ADMISSION DIAGNOSIS:  Weakness [R53.1]  DISCHARGE DIAGNOSIS:  Active Problems:   Sepsis (HCC)   SECONDARY DIAGNOSIS:   Past Medical History:  Diagnosis Date  . Anxiety   . Chronic back pain   . Collagen vascular disease (HCC)   . Coronary artery disease    a. s/p PCI/DES to dRCA & mLCx in 2014 with repeat LHC in 10/2015 showing patent stents  . Diabetes mellitus without complication (HCC)   . Hypertension   . Low back pain   . Myocardial infarction (HCC)   . Osteomyelitis of toe (HCC) 01/23/2016  . Stroke [facility](HCC)     HOSPITAL COURSE:   * Sepsis   with fever, tachycardia and elevated lactic acid at the time of admission  Urine culture and sensitivity and blood cultures are obtained Monitor lactic acid level check procalcitonin IV Rocephin and IV fluids  * Acute cystitis failed outpatient antibiotics Urine culture and sensitivity ordered and patient is started on IV Rocephin. Follow-up on the cultures including blood culture and sensitivity No growth so far.  Will give cefpodoxime on discharge.  * Acute kidney injury prerenal from poor p.o. Intake IV fluids and avoid nephrotoxins and monitor renal function closely  * Moderate to severe constipation Senokot, MiraLAX, Dulcolax suppository Enema as needed  * Diabetes mellitus Sliding scale insulin Decreased home dose of glargine to 12 units from her home dose 20 units  * Hypertension  Continue home medication metoprolol and desmopressin  * Chronic history of anxiety continue home medication Effexor, Xanax as needed  *Left shoulder pain, as per patient this is chronic complaint but it is limiting her activities and  she is concerned about not able to move it to support her.  CT scan on the left arm which did not show any abnormality other than arthritis.  Advised to follow-up in orthopedic clinic.  DISCHARGE CONDITIONS:   Stable.  CONSULTS OBTAINED:    DRUG ALLERGIES:   Allergies  Allergen Reactions  . Codeine Nausea And Vomiting  . Flu Virus Vaccine     Other reaction(s): Other (See Comments) Passed out for 12 days  . Influenza Vaccines Other (See Comments)    Reaction:  Caused pt to pass out   . Methadone Hives and Itching  . Oxycodone-Acetaminophen Nausea And Vomiting  . Percocet [Oxycodone-Acetaminophen] Nausea And Vomiting  . Tetanus Antitoxin   . Tetanus Toxoids Swelling and Other (See Comments)    Reaction:  Swelling at injection site  . Penicillin G Rash    Has patient had a PCN reaction causing immediate rash, facial/tongue/throat swelling, SOB or lightheadedness with hypotension: Yes Has patient had a PCN reaction causing severe rash involving mucus membranes or skin necrosis: No Has patient had a PCN reaction that required hospitalization: No Has patient had a PCN reaction occurring within the last 10 years: No If all of the above answers are "NO", then may proceed with Cephalosporin use..  . Tetracyclines & Related Rash    DISCHARGE MEDICATIONS:   Allergies as of 09/13/2018      Reactions   Codeine Nausea And Vomiting   Flu Virus Vaccine    Other reaction(s): Other (See Comments) Passed out for 12  days   Influenza Vaccines Other (See Comments)   Reaction:  Caused pt to pass out    Methadone Hives, Itching   Oxycodone-acetaminophen Nausea And Vomiting   Percocet [oxycodone-acetaminophen] Nausea And Vomiting   Tetanus Antitoxin    Tetanus Toxoids Swelling, Other (See Comments)   Reaction:  Swelling at injection site   Penicillin G Rash   Has patient had a PCN reaction causing immediate rash, facial/tongue/throat swelling, SOB or lightheadedness with hypotension:  Yes Has patient had a PCN reaction causing severe rash involving mucus membranes or skin necrosis: No Has patient had a PCN reaction that required hospitalization: No Has patient had a PCN reaction occurring within the last 10 years: No If all of the above answers are "NO", then may proceed with Cephalosporin use..   Tetracyclines & Related Rash      Medication List    STOP taking these medications   sulfamethoxazole-trimethoprim 800-160 MG tablet Commonly known as:  BACTRIM DS,SEPTRA DS     TAKE these medications   acetaminophen 500 MG tablet Commonly known as:  TYLENOL Take 1,000 mg by mouth every 8 (eight) hours as needed for mild pain or headache.   ALPRAZolam 1 MG tablet Commonly known as:  XANAX Take 1 mg by mouth 3 (three) times daily.   aspirin EC 81 MG tablet Take 81 mg by mouth daily.   atorvastatin 20 MG tablet Commonly known as:  LIPITOR Take 20 mg by mouth at bedtime.   Biotin 5 MG Caps Take 5 mg by mouth daily.   cefpodoxime 100 MG tablet Commonly known as:  VANTIN Take 1 tablet (100 mg total) by mouth 2 (two) times daily for 4 days.   celecoxib 200 MG capsule Commonly known as:  CELEBREX Take 200 mg by mouth daily.   cyclobenzaprine 10 MG tablet Commonly known as:  FLEXERIL Take 1 tablet (10 mg total) by mouth 3 (three) times daily as needed for muscle spasms.   diphenoxylate-atropine 2.5-0.025 MG tablet Commonly known as:  LOMOTIL Take 1 tablet by mouth every 4 (four) hours as needed for diarrhea or loose stools.   ferrous sulfate 325 (65 FE) MG tablet Take 325 mg by mouth daily with breakfast.   insulin aspart 100 UNIT/ML injection Commonly known as:  novoLOG Inject 3-15 Units into the skin 3 (three) times daily with meals as needed for high blood sugar. Pt uses as needed per sliding scale:    Less than 140:  0 units  140-180:  3 units 181-220:  4 units 221- 260:  6 units 261- 320:  8 units 321-360:  10 units 361-400:  12 units Greater  than 400:  15 units   Insulin Glargine 300 UNIT/ML Sopn Inject 15 Units into the skin daily. What changed:  how much to take   isosorbide mononitrate 30 MG 24 hr tablet Commonly known as:  IMDUR Take 30 mg by mouth daily.   lisinopril 40 MG tablet Commonly known as:  PRINIVIL,ZESTRIL Take 1 tablet (40 mg total) by mouth daily. What changed:    medication strength  how much to take   metoprolol tartrate 25 MG tablet Commonly known as:  LOPRESSOR Take 25 mg by mouth 2 (two) times daily.   mirtazapine 15 MG tablet Commonly known as:  REMERON Take 15 mg by mouth at bedtime.   morphine 15 MG 12 hr tablet Commonly known as:  MS CONTIN Take 1 tablet (15 mg total) by mouth every 12 (twelve) hours.  multivitamin with minerals Tabs tablet Take 1 tablet by mouth daily.   nitrofurantoin 100 MG capsule Commonly known as:  MACRODANTIN Take 100 mg by mouth daily.   nitroGLYCERIN 0.4 MG SL tablet Commonly known as:  NITROSTAT Place 0.4 mg under the tongue every 5 (five) minutes as needed for chest pain. Reported on 04/16/2016   omeprazole 20 MG capsule Commonly known as:  PRILOSEC Take 20 mg by mouth daily.   oxybutynin 10 MG 24 hr tablet Commonly known as:  DITROPAN-XL oxybutynin chloride ER 10 mg tablet,extended release 24 hr   polyethylene glycol powder powder Commonly known as:  GLYCOLAX/MIRALAX 17 grams as needed each time for constpation   pregabalin 150 MG capsule Commonly known as:  LYRICA Take 1 capsule (150 mg total) by mouth 2 (two) times daily.   TOVIAZ 8 MG Tb24 tablet Generic drug:  fesoterodine Take 8 mg by mouth daily.   traZODone 100 MG tablet Commonly known as:  DESYREL Take 100 mg by mouth at bedtime.   venlafaxine XR 75 MG 24 hr capsule Commonly known as:  EFFEXOR-XR Take 75 mg by mouth daily with breakfast.   vitamin B-12 1000 MCG tablet Commonly known as:  CYANOCOBALAMIN Take 1,000 mcg by mouth daily.   Vitamin D (Ergocalciferol) 50000  units Caps capsule Commonly known as:  DRISDOL Take 50,000 Units by mouth every Sunday.        DISCHARGE INSTRUCTIONS:    Follow with PMD in 1 week.  If you experience worsening of your admission symptoms, develop shortness of breath, life threatening emergency, suicidal or homicidal thoughts you must seek medical attention immediately by calling 911 or calling your MD immediately  if symptoms less severe.  You Must read complete instructions/literature along with all the possible adverse reactions/side effects for all the Medicines you take and that have been prescribed to you. Take any new Medicines after you have completely understood and accept all the possible adverse reactions/side effects.   Please note  You were cared for by a hospitalist during your hospital stay. If you have any questions about your discharge medications or the care you received while you were in the hospital after you are discharged, you can call the unit and asked to speak with the hospitalist on call if the hospitalist that took care of you is not available. Once you are discharged, your primary care physician will handle any further medical issues. Please note that NO REFILLS for any discharge medications will be authorized once you are discharged, as it is imperative that you return to your primary care physician (or establish a relationship with a primary care physician if you do not have one) for your aftercare needs so that they can reassess your need for medications and monitor your lab values.    Today   CHIEF COMPLAINT:   Chief Complaint  Patient presents with  . Abdominal Pain    HISTORY OF PRESENT ILLNESS:  Jessica Donovan  is a 72 y.o. female with a known history of coronary artery disease, hypertension, diabetes mellitus, history of stroke with left-sided weakness and multiple other medical problems is presenting to the ED with a complaint of dysuria and urinary frequency associated with fever.   Patient chronically takes nitrofurantoin to prevent UTIs. patient was seen by urgent care for urinary frequency and dysuria and was started on Bactrim but she had allergic reaction to that and still having symptoms of urinary frequency and dysuria associated with fever which prompted her to come  to the hospital.  Patient was complaining of flank pain.  CT abdomen has revealed no acute findings but moderate to large stool in the colon.  Urine culture was sent in the ED and she did started on IV Rocephin and hospitalist team is called to admit the patient.  Patient denies any nausea during my examination.   VITAL SIGNS:  Blood pressure (!) 147/78, pulse 86, temperature 98.7 F (37.1 C), temperature source Oral, resp. rate 18, height 5\' 5"  (1.651 m), weight 78.9 kg, SpO2 97 %.  I/O:    Intake/Output Summary (Last 24 hours) at 09/13/2018 1007 Last data filed at 09/12/2018 1850 Gross per 24 hour  Intake 480 ml  Output -  Net 480 ml    PHYSICAL EXAMINATION:   GENERAL:  72 y.o.-year-old patient lying in the bed with no acute distress.  EYES: Pupils equal, round, reactive to light and accommodation. No scleral icterus. Extraocular muscles intact.  HEENT: Head atraumatic, normocephalic. Oropharynx and nasopharynx clear.  NECK:  Supple, no jugular venous distention. No thyroid enlargement, no tenderness.  LUNGS: Normal breath sounds bilaterally, no wheezing, rales,rhonchi or crepitation. No use of accessory muscles of respiration.  CARDIOVASCULAR: S1, S2 normal. No murmurs, rubs, or gallops.  ABDOMEN: Soft, nontender, nondistended. Bowel sounds present. No organomegaly or mass.  EXTREMITIES: No pedal edema, cyanosis, or clubbing. Left arm on the lateral side there is some fluctuating soft structure which feels like same consistency as her muscles and it is not in constant shape, possibly distorted muscle tissue.  No enlargement on her axillary or supraclavicular lymph nodes. NEUROLOGIC: Cranial  nerves II through XII are intact. Muscle strength 5/5 in right extremities- 4/5 in left side. Sensation intact. Gait not checked.  PSYCHIATRIC: The patient is alert and oriented x 3.  SKIN: No obvious rash, lesion, or ulcer.    DATA REVIEW:   CBC Recent Labs  Lab 09/10/18 1659  WBC 8.1  HGB 13.1  HCT 37.8  PLT 176    Chemistries  Recent Labs  Lab 09/10/18 1345 09/10/18 1659  NA 135  --   K 4.9  --   CL 102  --   CO2 24  --   GLUCOSE 222*  --   BUN 26*  --   CREATININE 1.04* 0.96  CALCIUM 9.3  --   AST 20  --   ALT 11  --   ALKPHOS 82  --   BILITOT 0.5  --     Cardiac Enzymes No results for input(s): TROPONINI in the last 168 hours.  Microbiology Results  Results for orders placed or performed during the hospital encounter of 09/10/18  Culture, blood (Routine x 2)     Status: None (Preliminary result)   Collection Time: 09/10/18  1:46 PM  Result Value Ref Range Status   Specimen Description BLOOD BLOOD LEFT FOREARM  Final   Special Requests   Final    BOTTLES DRAWN AEROBIC AND ANAEROBIC Blood Culture results may not be optimal due to an excessive volume of blood received in culture bottles   Culture   Final    NO GROWTH 3 DAYS Performed at [facility], [address], [place], [place] [address]    Report Status PENDING  Incomplete  Culture, blood (Routine x 2)     Status: None (Preliminary result)   Collection Time: 09/10/18  1:46 PM  Result Value Ref Range Status   Specimen Description BLOOD BLOOD RIGHT HAND  Final   Special Requests  Final    BOTTLES DRAWN AEROBIC AND ANAEROBIC Blood Culture adequate volume   Culture   Final    NO GROWTH 3 DAYS Performed at [facility], [address] [place], [place] [address]    Report Status PENDING  Incomplete  Urine culture     Status: None   Collection Time: 09/10/18  2:59 PM  Result Value Ref Range Status   Specimen Description   Final    URINE, CATHETERIZED Performed at [facility], [address], [place], [place] [address]    Special Requests   Final    NONE Performed at [facility], [address] [place], [place] [address]    Culture   Final    NO GROWTH Performed at [facility], [address] [place], [place] [address]    Report Status 09/12/2018 FINAL  Final    RADIOLOGY:  Ct Humerus Left W Contrast  Result Date: 09/12/2018 CLINICAL DATA:  Left humeral mass for 6 months. EXAM: CT OF THE UPPER LEFT EXTREMITY WITH CONTRAST TECHNIQUE: Multidetector CT imaging of the upper left extremity was performed according to the standard protocol following intravenous contrast administration. COMPARISON:  None. CONTRAST:  75mL OMNIPAQUE IOHEXOL 300 MG/ML  SOLN FINDINGS: Bones/Joint/Cartilage No fracture or dislocation. Normal alignment. No joint effusion. No aggressive osseous lesion. Mild arthropathy of the acromioclavicular joint. Moderate osteoarthritis of the glenohumeral joint with joint space narrowing and marginal osteophytes. Ligaments Ligaments are suboptimally evaluated by CT. Muscles and Tendons Mild atrophy of the supraspinatus muscle. Remainder the muscles are normal. No intramuscular fluid collection or hematoma. Soft tissue No fluid collection or hematoma. No soft tissue mass. No soft tissue mass, fluid collection or hematoma at the site of clinical palpable abnormality in the lateral aspect of upper arm. IMPRESSION: 1. No soft tissue mass, fluid collection or hematoma at the site of clinical palpable abnormality in the lateral aspect of upper arm. 2. Moderate osteoarthritis of glenohumeral joint. Electronically Signed   By: Elige Ko   On: 09/12/2018 13:40    EKG:   Orders placed or performed during the hospital encounter of 09/10/18  . ED EKG  . ED EKG      Management plans discussed with the patient, family and they are in agreement.  CODE STATUS:     Code Status Orders  (From admission, onward)         Start      Ordered   09/11/18 1749  Do not attempt resuscitation (DNR)  Continuous    Question Answer Comment  In the event of cardiac or respiratory ARREST Do not call a "code blue"   In the event of cardiac or respiratory ARREST Do not perform Intubation, CPR, defibrillation or ACLS   In the event of cardiac or respiratory ARREST Use medication by any route, position, wound care, and other measures to relive pain and suffering. May use oxygen, suction and manual treatment of airway obstruction as needed for comfort.      09/11/18 1748        Code Status History    Date Active Date Inactive Code Status Order ID Comments User Context   09/10/2018 1635 09/11/2018 1748 Full Code [id number]  Ramonita Lab, MD Inpatient   09/10/2018 1620 09/10/2018 1635 Full Code [id number]  Ramonita Lab, MD ED   07/07/2018 0220 07/07/2018 1910 DNR [id number]  Cammy Copa, MD Inpatient   06/10/2017 1446 06/10/2017 2022 DNR [id number]  Delfino Lovett, MD Inpatient  06/09/2017 1743 06/10/2017 1446 Full Code [id number]  Enid Baas, MD Inpatient   05/19/2017 2144 05/23/2017 1719 DNR [id number]  Oralia Manis, MD Inpatient   02/23/2017 1640 03/01/2017 1902 DNR [id number]  Altamese Dilling, MD Inpatient   01/17/2017 0407 01/19/2017 2010 DNR [id number]  Arnaldo Natal, MD Inpatient   04/09/2016 0311 04/10/2016 2044 DNR [id number]  Arnaldo Natal, MD Inpatient   04/09/2016 0205 04/09/2016 0311 Full Code [id number]  Oralia Manis, MD Inpatient   04/03/2016 1853 04/04/2016 1817 Full Code [id number]  Houston Siren, MD Inpatient   02/23/2016 2252 02/25/2016 1637 DNR [id number]  Robley Fries, MD Inpatient   01/24/2016 1554 01/27/2016 1906 DNR [id number]  Milagros Loll, MD Inpatient   01/23/2016 1607 01/23/2016 1622 Full Code [id number]  Shaune Pollack, MD Inpatient   12/07/2015 2223 12/09/2015 1858 Full Code [id number]  Auburn Bilberry, MD Inpatient   11/29/2015 1741 12/01/2015 1833 DNR [id number]  Shaune Pollack, MD Inpatient   11/10/2015 0954 11/12/2015 2139  DNR [id number]  Linus Galas, MD Inpatient   11/07/2015 1732 11/10/2015 0954 DNR [id number]  Altamese Dilling, MD Inpatient   11/07/2015 1549 11/07/2015 1732 Full Code [id number]  Altamese Dilling, MD Inpatient      TOTAL TIME TAKING CARE OF THIS PATIENT: 35 minutes.    Altamese Dilling M.D on 09/13/2018 at 10:07 AM  Between 7am to 6pm - Pager - [phone]  After 6pm go to [email or web address] - password [email or web address]  [facility] [employer] [facility]  Office  [phone]  CC: Primary care physician; Garlon Hatchet, MD   Note: This dictation was prepared with Dragon dictation along with smaller phrase technology. Any transcriptional errors that result from this process are unintentional.

## 2018-09-15 LAB — CULTURE, BLOOD (ROUTINE X 2)
CULTURE: NO GROWTH
CULTURE: NO GROWTH
Special Requests: ADEQUATE

## 2018-09-16 ENCOUNTER — Other Ambulatory Visit: Payer: Self-pay

## 2018-09-16 ENCOUNTER — Ambulatory Visit: Payer: PPO | Admitting: Podiatry

## 2018-09-16 ENCOUNTER — Other Ambulatory Visit: Payer: Self-pay | Admitting: *Deleted

## 2018-09-16 ENCOUNTER — Encounter: Payer: Self-pay | Admitting: Podiatry

## 2018-09-16 DIAGNOSIS — I70235 Atherosclerosis of native arteries of right leg with ulceration of other part of foot: Secondary | ICD-10-CM | POA: Diagnosis not present

## 2018-09-16 DIAGNOSIS — E0843 Diabetes mellitus due to underlying condition with diabetic autonomic (poly)neuropathy: Secondary | ICD-10-CM

## 2018-09-16 DIAGNOSIS — L97512 Non-pressure chronic ulcer of other part of right foot with fat layer exposed: Secondary | ICD-10-CM

## 2018-09-16 NOTE — Patient Outreach (Signed)
[facility]) Care Management  09/16/2018  Jessica Donovan 1946-06-17 [id number]   EMMI-general discharge  D/c from [facility] 09/13/18  RED ON EMMI ALERT Day # 1 Date: 09/15/18  Red Alert Reason: read discharge papers? No unfilled prescripitions? Yes  Insurance HTA  Outreach attempt # 1 No answer. [facility] RN CM left HIPAA compliant voicemail message along with CM's contact info.   Plan: [facility] RN CM sent an unsuccessful outreach letter and scheduled this patient for another call attempt within 4 business days  Jazzlyn Huizenga L. Noelle Penner, RN, BSN, CCM [facility] Telephonic Care Management Care Coordinator Office number [phone] Mobile number [phone]  Main [facility] number [phone] Fax number [phone]

## 2018-09-16 NOTE — Patient Outreach (Addendum)
Karie Schwalbe [facility]) Care Management  [facility]   09/16/2018  Quantia Grullon Apr 20, 1946 [id number]  72 year old female outreached by [facility] services for a 30 day post discharge medication review.  PMHx includes, but not limited to, coronary artery disease, h/o MI, diabetic osteomyelitis, neuropathy, diabetes mellitus, dementia, multiple falls, chronic narcotic use, generalized anxiety disorder and depression.  Successful outreach to Ms. Lupinski.  HIPAA identifiers verified.   Subjective: Ms. Ethier reports chronic back pain stemming from a fall when she was 72 yo.  She reports using MS Contin every 12 hours along with MSIR every 8 hours for pain. She states that she has terrible muscle spasms and takes cyclobenzaprine every 8 hours.  She also reports using an OTC allergy product containing diphenhydramine 25 mg and she states she takes 2 capsules up to twice daily.  She reports that she has been told by a docotor that she is "living on borrowed time."  Ms. Solum says she checks her CBGs three times day with a range of 128-178 mg/dL.  She also says she is supposed to use a CPAP machine, but can't tolerate anything on her face.   Objective:  HgA1c 7.4% on 09/11/18 SCr 0.96 mg/dL  Current Outpatient Medications  Medication Sig Dispense Refill  . acetaminophen (TYLENOL) 500 MG tablet Take 1,000 mg by mouth every 8 (eight) hours as needed for mild pain or headache.     . ALPRAZolam (XANAX) 1 MG tablet Take 1 mg by mouth 3 (three) times daily.     [name] aspirin EC 81 MG tablet Take 81 mg by mouth daily.    [name] atorvastatin (LIPITOR) 20 MG tablet Take 20 mg by mouth daily.     . Biotin 5 MG CAPS Take 5 mg by mouth daily.     . cefpodoxime (VANTIN) 100 MG tablet Take 1 tablet (100 mg total) by mouth 2 (two) times daily for 4 days. 8 tablet 0  . celecoxib (CELEBREX) 200 MG capsule Take 200 mg by mouth daily.     . cyclobenzaprine (FLEXERIL) 10 MG tablet Take 1 tablet (10 mg total) by  mouth 3 (three) times daily as needed for muscle spasms. 20 tablet 0  . diphenhydrAMINE (DIPHEN) 25 MG tablet Take 50 mg by mouth daily. Sometimes takes up to twice daily.    . diphenoxylate-atropine (LOMOTIL) 2.5-0.025 MG tablet Take 1 tablet by mouth every 4 (four) hours as needed for diarrhea or loose stools.     . ferrous sulfate 325 (65 FE) MG tablet Take 325 mg by mouth daily with breakfast.    . fesoterodine (TOVIAZ) 8 MG TB24 tablet Take 8 mg by mouth daily.    [name] gentamicin cream (GARAMYCIN) 0.1 % Apply 1 application topically daily. To wound on right toe    . insulin aspart (NOVOLOG) 100 UNIT/ML injection Inject 3-15 Units into the skin 3 (three) times daily with meals as needed for high blood sugar. Pt uses as needed per sliding scale:    Less than 140:  0 units  140-180:  3 units 181-220:  4 units 221- 260:  6 units 261- 320:  8 units 321-360:  10 units 361-400:  12 units Greater than 400:  15 units    . Insulin Glargine (TOUJEO SOLOSTAR) 300 UNIT/ML SOPN Inject 15 Units into the skin daily. (Patient taking differently: Inject 15 Units into the skin daily. Patinet reports taking 50 units daily.) 1 pen 0  . isosorbide mononitrate (IMDUR)  30 MG 24 hr tablet Take 30 mg by mouth daily.     [name] lisinopril (PRINIVIL,ZESTRIL) 40 MG tablet Take 1 tablet (40 mg total) by mouth daily. 30 tablet 0  . metoprolol tartrate (LOPRESSOR) 25 MG tablet Take 25 mg by mouth 2 (two) times daily.    . mirtazapine (REMERON) 15 MG tablet Take 15 mg by mouth at bedtime.    [name] morphine (MS CONTIN) 15 MG 12 hr tablet Take 1 tablet (15 mg total) by mouth every 12 (twelve) hours. 10 tablet 0  . morphine (MSIR) 15 MG tablet Take 15 mg by mouth every 8 (eight) hours.    . Multiple Vitamin (MULTIVITAMIN WITH MINERALS) TABS tablet Take 1 tablet by mouth daily.    . nitrofurantoin (MACRODANTIN) 100 MG capsule Take 100 mg by mouth daily.    . nitroGLYCERIN (NITROSTAT) 0.4 MG SL tablet Place 0.4 mg under the tongue  every 5 (five) minutes as needed for chest pain. Reported on 04/16/2016    . omeprazole (PRILOSEC) 20 MG capsule Take 20 mg by mouth daily.    . pregabalin (LYRICA) 150 MG capsule Take 1 capsule (150 mg total) by mouth 2 (two) times daily.    . traZODone (DESYREL) 100 MG tablet Take 100 mg by mouth at bedtime.    [name] venlafaxine (EFFEXOR) 75 MG tablet Take 75 mg by mouth 3 (three) times daily with meals.    . vitamin B-12 (CYANOCOBALAMIN) 1000 MCG tablet Take 5,000 mcg by mouth daily.     . Vitamin D, Ergocalciferol, (DRISDOL) 50000 units CAPS capsule Take 50,000 Units by mouth every Sunday.     . polyethylene glycol powder (GLYCOLAX/MIRALAX) powder 17 grams as needed each time for constpation (Patient not taking: Reported on 09/16/2018) 255 g 3    No current facility-administered medications for this visit.     Functional Status: In your present state of health, do you have any difficulty performing the following activities: 09/10/2018 07/07/2018  Hearing? N N  Vision? N N  Difficulty concentrating or making decisions? [name]  Walking or climbing stairs? Y Y  Dressing or bathing? Y Y  Doing errands, shopping? N N  Some recent data might be hidden    Fall/Depression Screening: Fall Risk  06/28/2017 06/21/2017 03/26/2017  Falls in the past year? (No Data) Yes Yes  Comment Patient denies new falls - -  Number falls in past yr: - 2 or more 2 or more  Injury with Fall? - Yes Yes  Risk Factor Category  - High Fall Risk High Fall Risk  Risk for fall due to : - Impaired balance/gait;Impaired mobility Impaired balance/gait;Impaired mobility  Follow up - Falls prevention discussed Falls prevention discussed   PHQ 2/9 Scores 06/21/2017 03/26/2017 03/09/2017 03/09/2016 02/21/2016  PHQ - 2 Score 1 2 0 2 1  PHQ- 9 Score - 5 - 4 6   ASSESSMENT: Date Discharged from Hospital: 09/13/18 Date Medication Reconciliation Performed: 09/16/2018  Medications Discontinued at  Discharge:  Sulfamethoxazole/Trimethoprim  New Medications at Discharge:   Nitrofurantoin  Cefpodoxime (last dose 09/17/18)  Lisinopril increased to 40mg   Patient was recently discharged from hospital and all medications have been reviewed  Drugs sorted by system:  Neurologic/Psychologic:alprazolam, venlafaxine, mirtazapine trazodone, pregabalin  Cardiovascular:asprin 81 mg, atorvastatin, isosorbide mononitrate, lisinopril, metoprolol  Pulmonary/Allergy:diphenhydramine  Gastrointestinal: omeprazole, diphenoxylate/atropine, polyethylene glycol  Endocrine: insulin aspart, insulin glargine  Topical: gentamicin cream  Pain: acetaminophen, celecoxib, morphine sulfate controlled release, morphine sulfate immediate release  Vitamins/Minerals/Supplements: biotin, ferrous  sulfate, MVI, vitamin B-12, vitamin-D  Infectious Diseases:cefpodoxime, nitrofurantoin  Miscellaneous: cyclobenzaprine, fesoterodine   Medications to avoid in the elderly: Per the Beers List, diphenhydramine and diphenoxylate/atropine is highly anticholinergic and clearance is reduced with advanced age. Risk of confusion, dry mouth, constipation and other anticholinergic effects or toxicity may occur.  There is strong evidence to avoid use in the elderly.  Per the Beers List, nitrofurantoin has the potential for peripheral neuropathy with long term use in the elderly.  Consider other options for UTI prophylaxis since Ms.Grego already has neuropathy.  Per the Beers List, alprazolam may have decreased metabolism and increases sensitivity in older adults.  Risk of cognitive impairment, delirium, falls, fractures and motor vehicle accidents may occur. There is strong evidence to avoid use in the elderly.  Per the Beers List, cycloenzaprine may be poorly tolerated by older adults.  Muscle relaxants may have anticholinergic adverse effects, sedation, increased risk of fractures and effectiveness at dosages tolerated by  older adults is questionable.  There is strong evidence to avoid use in the elderly  Per the Beers List, celecoxib with long term use has an increased risk of GI bleeds.  Consider if this patient should continue on this medication.  She is on a proton pump inhibitor.    Other issues noted:  Ms. Caprara is on multiple medications that cause sedation and therefore can increase her risk of falls and fractures.  Encourage PCP to evaluate the need for each of her highly sedative medications and look for opportunities to streamline her regimen.    Toujeo-  Discharge instructions are for Toujeo 15 units daily.   Patient reports taking 50 units daily.  Call placed to PCP to clarify dose.  She used Novolog SSI for CBGs > 140 mg/dL.  Counseled patient to discontinue OTC diphenhydramine (discussed anticholinergic and sedative side effects of antihistamines) and to purchase a non-sedating antihistamine such as loratadine.  She verbalized understanding.  Medication Assistance: Ms. Climer reports that she receives patient assistance for her Nathen May, Lyrica, and Gala Murdoch.  PLAN: Route note to PCP, Dr. Emilia Beck in [place], [place].  Follow up her correct Toujeo dose.   [name], PharmD Clinical Pharmacist [facility] [phone]

## 2018-09-18 NOTE — Progress Notes (Signed)
   Subjective:  72 year old female with PMHx of T2DM presenting today for follow up evaluation of an ulceration of the right 2nd toe and left heel.  Patient states that the toe and heel looks better.  She denies pain at this time.  She presents for further treatment evaluation.  She has been applying gentamicin cream daily with a bandage.  She is also been wearing the surgical postoperative shoe to the right foot.   Past Medical History:  Diagnosis Date  . Anxiety   . Chronic back pain   . Collagen vascular disease (HCC)   . Coronary artery disease    a. s/p PCI/DES to dRCA & mLCx in 2014 with repeat LHC in 10/2015 showing patent stents  . Diabetes mellitus without complication (HCC)   . Hypertension   . Low back pain   . Myocardial infarction (HCC)   . Osteomyelitis of toe (HCC) 01/23/2016  . Stroke [facility])      Objective/Physical Exam General: The patient is alert and oriented x3 in no acute distress.  Dermatology:  Ulceration to the right second toe and left heel has healed.  Complete reepithelialization has occurred.  No evidence of open ulcer at this time.  No drainage.  No sign of infectious process noted.  Skin is warm, dry and supple bilateral lower extremities.  Vascular: Palpable pedal pulses bilaterally. No edema or erythema noted. Capillary refill within normal limits.  Neurological: Epicritic and protective threshold diminished bilaterally.   Musculoskeletal Exam: Range of motion within normal limits to all pedal and ankle joints bilateral. Muscle strength 5/5 in all groups bilateral.   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 #2  Discontinue wearing postoperative shoe.  Recommend good supportive  shoe gear and diabetic shoes. #3 silicone toe Was dispensed to offload the pressure from the distal tuft of the second digit ulceration site. #4 return to clinic in 3 months for routine care  Felecia Shelling, DPM [facility]  Dr. Felecia Shelling, DPM    [address]                                        [place], [place] [address]                Office [phone]  Fax [phone]

## 2018-09-19 ENCOUNTER — Ambulatory Visit: Payer: Self-pay

## 2018-09-19 ENCOUNTER — Other Ambulatory Visit: Payer: Self-pay

## 2018-09-19 ENCOUNTER — Other Ambulatory Visit: Payer: Self-pay | Admitting: *Deleted

## 2018-09-19 NOTE — Patient Outreach (Addendum)
[facility]) Care Management  09/19/2018  Jessica Donovan May 30, 1946 [id number]  Outreach call to PCP, Dr. Emilia Beck in [place] to clarify [name] Toujeo dose.  Left message on the Triage nurse line.   Plan: Await return call from office.    [name], PharmD Clinical Pharmacist [facility] [phone]  Incoming call received from Dr. Myrna Blazer office.  Nurse states that patient had been prescribed 50 unit of Toujeo by the PCP, but upon discharge from they hospital she was prescribed 15 units by hospital MD. She has an appointment this Friday with PCP and he will evaluate.  Plan: Contact PCP next Monday to determine Toujeo dose.   [name], PharmD Clinical Pharmacist [facility] [phone]

## 2018-09-19 NOTE — Patient Outreach (Signed)
[facility]) Care Management  09/19/2018  Lochlyn Zullo [birth year] [id number]   EMMI-general discharge  D/c from [facility] 09/13/18  RED ON EMMI ALERT Day # 1 Date: 09/15/18  Red Alert Reason: read discharge papers? No unfilled prescripitions? Yes  Insurance HTA  Outreach attempt # 2 No answer. [facility] RN CM left HIPAA compliant voicemail message along with CM's contact info.   Plan: [facility] RN CM scheduled this patient for a third call attempt within 4 business days  Jarelis Ehlert L. Noelle Penner, RN, BSN, CCM [facility] Telephonic Care Management Care Coordinator Office number [phone] Mobile number [phone]  Main [facility] number [phone] Fax number [phone]

## 2018-09-20 ENCOUNTER — Other Ambulatory Visit: Payer: Self-pay | Admitting: *Deleted

## 2018-09-20 NOTE — Patient Outreach (Signed)
  [facility]) Care Management  09/20/2018  Jessica Donovan 12/19/1946 [id number]   EMMI-general discharge   RED ON EMMI ALERT Day # 4  Date: 09/18/18  Red Alert Reason: Lost interest in things? Yes   And    D/c from [facility] 09/13/18 RED ON EMMI ALERT Day #1 Date:09/15/18 Red Alert Reason:read discharge papers? No unfilled prescriptions? Yes  Insurance HTA   Outreach attempt # 3 successful at her home number Patient is able to verify HIPAA [facility] Care Management RN reviewed and addressed red alert with patient She confirms she has a lost of interest in things "because of my health"and her relationship with her daughter and sister She confirms she did not read her discharge papers but did get all her prescriptions filled  Social: Jessica Donovan is widowed (husband died 9 years ago) She has a daughter, Babette Relic and a sister, Carney Bern but they are not supportive She moved to [place] from [place] about 5 years ago She reports she does not drive anymore and hs to rely on friends to assist when possible. She shared her "sadness" related to her daughter and sister not speaking with her mainly in the last year She reports her daughter went to live with her sister. She reports she has a "broken heart" She was encouraged to contact her family She reports she has but has not had a response CM encourage activities at [employer] agency She is noted to be on Remeron, trazodone and Effexor   Conditions: hypotension, syncope, CAD, old MI, acute colitis, angina , dementia, lumbago, osteomyelitis, DM type 2, anxiety, left side CVA, left toe amputation    Medications: denies concerns with taking medications as prescribed, affording medications, side effects of medications and questions about medications She reports a recent reaction to sulfa antibiotic this was added to her allergy list and Epic alert for Celebrex discussed She was encouraged to speak with Dr Emilia Beck  about possible reaction concerns with Celebrex   Appointments: she is able to tell Cm she sees her primary MD Dr Emilia Beck on Friday 09/23/18 ans she was already seen by her specialist on 09/16/18   Advance Directives: She reports being given forms in the hospital but has not completed them at this time She was encouraged to seek assist from [facility] staff prn   Consent: [facility] RN CM reviewed [facility] services with patient. Patient gave verbal consent for services. Advised patient that there will be further automated EMMI- post discharge calls to assess how the patient is doing following the recent hospitalization Advised the patient that another call may be received from a nurse if any of their responses were abnormal. Patient voiced understanding and was appreciative of f/u call.   Plan: [facility] RN CM will refer Jessica Bowdoin to [facility] for assistance with community resources and alternate local transportation services to medical appointments when her friends may not be available  Routed this note to Dr Marcy Salvo L. Noelle Penner, RN, BSN, CCM [facility] Telephonic Care Management Care Coordinator Office number [phone] Mobile number [phone]  Main [facility] number [phone] Fax number [phone]

## 2018-09-21 ENCOUNTER — Ambulatory Visit: Payer: Self-pay | Admitting: *Deleted

## 2018-09-23 ENCOUNTER — Other Ambulatory Visit: Payer: Self-pay | Admitting: *Deleted

## 2018-09-23 NOTE — Patient Outreach (Signed)
[facility]) Care Management  09/23/2018  Jessica Donovan March 27, 1946 [id number]   This social worker received a referral from the telephonic RNCM with request to assist patient with community resources and transportation needs to medical appointments. Per patient, she continues to see her primary care doctor who is in [place], [place] and will often have to pay her friends over $100.00 to transport her there. Per patient, she has one friend that will take her at no cost but she tries not to burden her with this responsibility and would like a back up plan. Patient also has a doctor that she needs to see in [place] that provided epidurals and cortisone shots.This Child psychotherapist discussed that patient may want to consider changing her primary care doctor to a local doctor as the transportatio resources in her area are limited. [facility] only provides transportation within the [employer] city limits.  This Child psychotherapist encouraged patient to contact the customer service number on the back of her insurance card for a list of in-network providers.  Patient agrees to contact her insurance company  for a list of in network providers in her area due to transportation concerns. Patient verbalized having no additional community resource needs at this time. This Child psychotherapist will sign off.   [name] [facility] Care Management [phone]

## 2018-09-26 ENCOUNTER — Other Ambulatory Visit: Payer: Self-pay

## 2018-09-26 ENCOUNTER — Ambulatory Visit: Payer: Self-pay

## 2018-09-26 NOTE — Patient Outreach (Signed)
[facility]) Care Management  09/26/2018  Jessica Donovan 02-May-1946 [id number]  Outreach to Dr. Myrna Blazer office.  Left message with Triage nurse asking for Toujeo dose clarification.  [name] had visit with PCP on 09/23/18.  Plan: Await call from PCP office.   [name], PharmD Clinical Pharmacist [facility] [phone]

## 2018-09-30 ENCOUNTER — Ambulatory Visit: Payer: Self-pay

## 2018-09-30 ENCOUNTER — Other Ambulatory Visit: Payer: Self-pay

## 2018-09-30 NOTE — Patient Outreach (Signed)
[facility]) Care Management  09/30/2018  Jessica Donovan 14-May-1946 [id number]   Successful outreach to Jessica Donovan.  HIPAA identifiers verified.   Medication Management: Jessica Donovan reports that she saw her PCP, Dr. Emilia Beck last week. She states that she takes Toujeo 50 units/day if her morning CBG is >130 and if it is > 140 she will take Novolog SSI in addition to the Toujeo.  She checks her glucose again at supper and will use more SSI if needed.  If her morning CBG is  < 130 mg/dL, she doesn't take any insulin.    She reports that her UTI is back and that she had a fever earlier this week.  She was started back on Cefpodoxime, but at an increased dose of 200 mg twice daily for 5 days. She also reports that she is compliant with nitrofurantoin prophylaxis.  She reports being afebrile since she started the antibiotic.  Jessica Donovan states that she has an appointment with her PCP in ~ 2 weeks for follow up.   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  I recommended that she thinks about making an appointment with a urologist to further evaluate the reason of recurrent UTIs.  She verbalized understanding.   Plan: Outreach in 1 week to see if her UTI has cleared.  [name], PharmD Clinical Pharmacist [facility] [phone]

## 2018-10-03 DIAGNOSIS — M65311 Trigger thumb, right thumb: Secondary | ICD-10-CM | POA: Diagnosis not present

## 2018-10-03 DIAGNOSIS — M65312 Trigger thumb, left thumb: Secondary | ICD-10-CM | POA: Diagnosis not present

## 2018-10-03 DIAGNOSIS — M19012 Primary osteoarthritis, left shoulder: Secondary | ICD-10-CM | POA: Diagnosis not present

## 2018-10-07 ENCOUNTER — Ambulatory Visit: Payer: Self-pay

## 2018-10-07 ENCOUNTER — Other Ambulatory Visit: Payer: Self-pay

## 2018-10-07 NOTE — Patient Outreach (Signed)
[facility]) Care Management  10/07/2018  Albert Devaul [birth year] [id number]  Successful outreach call placed to Ms. Bua.  HIPAA verifiers identified.   Medication Management: Ms. Neto reports that she finished her second round of Cefpodoxine on Monday or Tuesday and that she is still having low grade fevers of 100.4.  She states that she does not have any symptoms.   Call placed to her PCP, Dr Emilia Beck (who is out of town).  They had an appointment for today, but patient declined saying she didn't have the money and couldn't get there.  Patient requested that we leave a message for the doctor covering for Dr. Emilia Beck  to see if he will call in antibiotics for her.  Patient also stated that she did not want to go back to her local Urgent Care because " I didn't like them."  Explained to patient that she needs to get her urine rechecked and that she may have an uncleared infection, given that she continued to have a fever a few days after she finished her antibiotics.  Of note, when she presented to the ED on 9/21 she had already been on Seprtra.  Her cath urine from that day was negative.  She was  was then changed to cefopoxime 100 mg BID and her next course dose was increased to 200 mg BID.  She states that she completed both courses.    Requested a urology referral in [facility].  Plan: Await return call from PCP office.  [name], PharmD Clinical Pharmacist [facility] [phone]

## 2018-10-10 ENCOUNTER — Other Ambulatory Visit: Payer: Self-pay

## 2018-10-10 ENCOUNTER — Ambulatory Visit: Payer: Self-pay

## 2018-10-10 NOTE — Patient Outreach (Signed)
[facility]) Care Management  [facility]  10/10/2018  Jessica Donovan 08-Nov-1946 [id number]  72 year old female outreached by [facility] services for a 30 day post discharge medication review.  PMHx includes, but not limited to, coronary artery disease, h/o MI, diabetic osteomyelitis, neuropathy, diabetes mellitus, dementia, multiple falls, chronic narcotic use, generalized anxiety disorder and depression.  Unsuccessful telephone call attempt #1 to patient. Left a HIPAA compliant voice message requesting a return call.    Plan:  I will make another outreach attempt in 1-2 days  to follow up to see if she has has anymore fevers after finishing her antibiotics.   [name], PharmD Clinical Pharmacist [facility] [phone]

## 2018-10-11 ENCOUNTER — Ambulatory Visit: Payer: Self-pay

## 2018-10-11 ENCOUNTER — Other Ambulatory Visit: Payer: Self-pay

## 2018-10-11 NOTE — Patient Outreach (Signed)
[facility]) Care Management  10/11/2018  Rocsi Hazelbaker [birth year] [id number]  Successful outreach call to Ms. Dettore.  HIPAA identifiers verified.   Medication Management: Ms. Terriquez reports that she has not had anymore fevers over the weekend and she is not having any UTI symptoms.  She states that she has an appointment with [facility] on 10/25/18 to evaluate her chronic UTIs.    Plan: Outreach to patient after her urology visit.   [name], PharmD Clinical Pharmacist [facility] [phone]

## 2018-10-25 ENCOUNTER — Ambulatory Visit: Payer: Self-pay | Admitting: Urology

## 2018-10-27 ENCOUNTER — Ambulatory Visit: Payer: Self-pay

## 2018-10-27 ENCOUNTER — Other Ambulatory Visit: Payer: Self-pay

## 2018-10-27 NOTE — Patient Outreach (Signed)
[facility]) Care Management  [facility]  10/27/2018  Murdis Flitton Oct 31, 1946 [id number]   Incoming message received from Ms. Mccormick that she had rescheduled her Urology appointment to December.    Unsuccessful telephone call attempt #1 to patient.    Plan:  Outreach to patient on 12/11 after Urology Appointment.  [name], PharmD Clinical Pharmacist [facility] [phone]

## 2018-11-29 ENCOUNTER — Ambulatory Visit: Payer: PPO | Admitting: Urology

## 2018-11-29 VITALS — BP 121/71 | HR 82 | Wt 186.0 lb

## 2018-11-29 DIAGNOSIS — N390 Urinary tract infection, site not specified: Secondary | ICD-10-CM | POA: Diagnosis not present

## 2018-11-29 DIAGNOSIS — N362 Urethral caruncle: Secondary | ICD-10-CM

## 2018-11-29 DIAGNOSIS — N952 Postmenopausal atrophic vaginitis: Secondary | ICD-10-CM

## 2018-11-29 DIAGNOSIS — N3281 Overactive bladder: Secondary | ICD-10-CM | POA: Diagnosis not present

## 2018-11-29 MED ORDER — ESTROGENS, CONJUGATED 0.625 MG/GM VA CREA: 1.0000 | TOPICAL_CREAM | Freq: Every day | VAGINAL | 12 refills | Status: DC

## 2018-11-29 NOTE — Patient Instructions (Addendum)
Start probiotic for UTI prevention  Increase cranberry to twice daily   Start premarin cream three times weekly per vagina/ urethra three times per week, Monday/ Wed/ and Friday  Please call our office for same day visit when you have UTI symptoms for catheterized UA

## 2018-11-29 NOTE — Progress Notes (Signed)
11/29/2018 3:53 PM   Jessica Donovan [birth year] [id number]  Referring provider: Garlon Hatchet, MD No address on file  Chief Complaint  Patient presents with  . Recurrent UTI    HPI: 72 year old female referred for further evaluation of recurrent urinary tract infections.  She was admitted on 08/2018 to the hospital for sepsis, presumably from urinary source.  At the time, her UA showed greater than 50 white blood cells per high-powered field, no bacteria or blood.  Her urine culture was ultimately negative.  She was referred by her primary care physician and review of all 11 pages of records do not include any UA/urine culture data.  Presumably has a personal history of OAB and is currently on Toviaz.  Looks like she has been on oxybutynin and Myrbetriq before in the past as well.  She does not recall why.  It also appears that she is on Macrobid daily started by her primary care physician last year.  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  She does not believe that it was cancerous.  She feels this surgery was "botched" but not able to provide much details as to why.    She also report having her bladder "tacked" twice in [place].  She was told that she would need to take Toviaz and nitrofurantoin for the rest of her life as a result and has been on that chronically.    We have very limited records.  She primary care physician is Dr. Emilia Beck  She has had multiple pelvic surgeries.     She does have chronic constipation.  PMH: Past Medical History:  Diagnosis Date  . Anxiety   . Chronic back pain   . Collagen vascular disease (HCC)   . Coronary artery disease    a. s/p PCI/DES to dRCA & mLCx in 2014 with repeat LHC in 10/2015 showing patent stents  . Diabetes mellitus without complication (HCC)   . Hypertension   . Low back  pain   . Myocardial infarction (HCC)   . Osteomyelitis of toe (HCC) 01/23/2016  . Stroke [facility])     Surgical History: Past Surgical History:  Procedure Laterality Date  . AMPUTATION TOE Left 11/10/2015   Procedure: AMPUTATION TOE;  Surgeon: Linus Galas, MD;  Location: [facility] ORS;  Service: Podiatry;  Laterality: Left;  . AMPUTATION TOE Left 01/24/2016   Procedure: AMPUTATION TOE (2nd mpj);  Surgeon: Linus Galas, DPM;  Location: [facility] ORS;  Service: Podiatry;  Laterality: Left;  . AMPUTATION TOE Right 12/21/2018   Procedure: 2ND TOE AMPUTATION WITH DEBRIDEMENT OF SOFT TISSUE;  Surgeon: Recardo Evangelist, DPM;  Location: [facility] ORS;  Service: Podiatry;  Laterality: Right;  . CARDIAC CATHETERIZATION N/A 11/12/2015   Procedure: Left Heart Cath and Coronary Angiography;  Surgeon: Marcina Millard, MD;  Location: [facility] INVASIVE CV LAB;  Service: Cardiovascular;  Laterality: N/A;  . COLONOSCOPY WITH PROPOFOL N/A 07/20/2017   Procedure: COLONOSCOPY WITH PROPOFOL;  Surgeon: Wyline Mood, MD;  Location: [facility] ENDOSCOPY;  Service: Endoscopy;  Laterality: N/A;  . JOINT REPLACEMENT    . TOE AMPUTATION      Home Medications:  Allergies as of 11/29/2018      Reactions   Bactrim [sulfamethoxazole-trimethoprim]    Codeine Nausea And Vomiting   Flu Virus Vaccine    Other reaction(s): Other (See Comments) Passed out for 12 days   Influenza Vaccines  Other (See Comments)   Reaction:  Caused pt to pass out    Methadone Hives, Itching   Oxycodone-acetaminophen Nausea And Vomiting   Percocet [oxycodone-acetaminophen] Nausea And Vomiting   Sulfa Antibiotics    Tetanus Antitoxin    Tetanus Toxoids Swelling, Other (See Comments)   Reaction:  Swelling at injection site   Penicillin G Rash   Has patient had a PCN reaction causing immediate rash, facial/tongue/throat swelling, SOB or lightheadedness with hypotension: Yes Has patient had a PCN reaction causing severe rash involving mucus membranes or skin necrosis:  No Has patient had a PCN reaction that required hospitalization: No Has patient had a PCN reaction occurring within the last 10 years: No If all of the above answers are "NO", then may proceed with Cephalosporin use..   Tetracyclines & Related Rash      Medication List       Accurate as of November 29, 2018 11:59 PM. Always use your most recent med list.        acetaminophen 500 MG tablet Commonly known as:  TYLENOL Take 1,000 mg by mouth every 8 (eight) hours as needed for mild pain or headache.   ALPRAZolam 1 MG tablet Commonly known as:  XANAX Take 1 mg by mouth 3 (three) times daily.   aspirin EC 81 MG tablet Take 81 mg by mouth daily.   atorvastatin 20 MG tablet Commonly known as:  LIPITOR Take 20 mg by mouth daily.   Biotin 5 MG Caps Take 5 mg by mouth daily.   celecoxib 200 MG capsule Commonly known as:  CELEBREX Take 200 mg by mouth daily.   conjugated estrogens vaginal cream Commonly known as:  PREMARIN Place 1 Applicatorful vaginally daily. Use pea sized amount M-W-Fr before bedtime   cyclobenzaprine 10 MG tablet Commonly known as:  FLEXERIL Take 1 tablet (10 mg total) by mouth 3 (three) times daily as needed for muscle spasms.   DIPHEN 25 MG tablet Generic drug:  diphenhydrAMINE Take 50 mg by mouth 2 (two) times daily as needed for itching, allergies or sleep.   diphenoxylate-atropine 2.5-0.025 MG tablet Commonly known as:  LOMOTIL Take 1 tablet by mouth every 4 (four) hours as needed for diarrhea or loose stools.   ferrous sulfate 325 (65 FE) MG tablet Take 325 mg by mouth daily with breakfast.   gentamicin cream 0.1 % Commonly known as:  GARAMYCIN Apply 1 application topically daily. To wound on right toe   insulin aspart 100 UNIT/ML injection Commonly known as:  novoLOG Inject 3-15 Units into the skin 3 (three) times daily with meals as needed for high blood sugar. Pt uses as needed per sliding scale:    Less than 140:  0 units  140-180:   3 units 181-220:  4 units 221- 260:  6 units 261- 320:  8 units 321-360:  10 units 361-400:  12 units Greater than 400:  15 units   Insulin Glargine 300 UNIT/ML Sopn Commonly known as:  TOUJEO SOLOSTAR Inject 15 Units into the skin daily.   isosorbide mononitrate 30 MG 24 hr tablet Commonly known as:  IMDUR Take 30 mg by mouth daily.   lisinopril 40 MG tablet Commonly known as:  PRINIVIL,ZESTRIL Take 1 tablet (40 mg total) by mouth daily.   metoprolol tartrate 25 MG tablet Commonly known as:  LOPRESSOR Take 25 mg by mouth 2 (two) times daily.   mirtazapine 15 MG tablet Commonly known as:  REMERON Take 15 mg by mouth at  bedtime.   morphine 15 MG tablet Commonly known as:  MSIR Take 15 mg by mouth 3 (three) times daily.   morphine 15 MG 12 hr tablet Commonly known as:  MS CONTIN Take 1 tablet (15 mg total) by mouth every 12 (twelve) hours.   multivitamin with minerals Tabs tablet Take 1 tablet by mouth daily.   nitrofurantoin 100 MG capsule Commonly known as:  MACRODANTIN Take 100 mg by mouth daily.   nitroGLYCERIN 0.4 MG SL tablet Commonly known as:  NITROSTAT Place 0.4 mg under the tongue every 5 (five) minutes as needed for chest pain.   omeprazole 20 MG capsule Commonly known as:  PRILOSEC Take 20 mg by mouth daily.   polyethylene glycol powder powder Commonly known as:  GLYCOLAX/MIRALAX 17 grams as needed each time for constpation   pregabalin 150 MG capsule Commonly known as:  LYRICA Take 1 capsule (150 mg total) by mouth 2 (two) times daily.   TOVIAZ 8 MG Tb24 tablet Generic drug:  fesoterodine Take 8 mg by mouth daily.   traZODone 100 MG tablet Commonly known as:  DESYREL Take 100 mg by mouth at bedtime.   venlafaxine 75 MG tablet Commonly known as:  EFFEXOR Take 75 mg by mouth 3 (three) times daily with meals.   vitamin B-12 1000 MCG tablet Commonly known as:  CYANOCOBALAMIN Take 5,000 mcg by mouth daily.   Vitamin D (Ergocalciferol)  1.25 MG (50000 UT) Caps capsule Commonly known as:  DRISDOL Take 50,000 Units by mouth every Sunday.       Allergies:  Allergies  Allergen Reactions  . Bactrim [Sulfamethoxazole-Trimethoprim]   . Codeine Nausea And Vomiting  . Flu Virus Vaccine     Other reaction(s): Other (See Comments) Passed out for 12 days  . Influenza Vaccines Other (See Comments)    Reaction:  Caused pt to pass out   . Methadone Hives and Itching  . Oxycodone-Acetaminophen Nausea And Vomiting  . Percocet [Oxycodone-Acetaminophen] Nausea And Vomiting  . Sulfa Antibiotics   . Tetanus Antitoxin   . Tetanus Toxoids Swelling and Other (See Comments)    Reaction:  Swelling at injection site  . Penicillin G Rash    Has patient had a PCN reaction causing immediate rash, facial/tongue/throat swelling, SOB or lightheadedness with hypotension: Yes Has patient had a PCN reaction causing severe rash involving mucus membranes or skin necrosis: No Has patient had a PCN reaction that required hospitalization: No Has patient had a PCN reaction occurring within the last 10 years: No If all of the above answers are "NO", then may proceed with Cephalosporin use..  . Tetracyclines & Related Rash    Family History: Family History  Problem Relation Age of Onset  . CAD Mother   . CAD Father     Social History:  reports that she has never smoked. She has never used smokeless tobacco. She reports that she does not drink alcohol or use drugs.  ROS: UROLOGY Frequent Urination?: Yes Hard to postpone urination?: No Burning/pain with urination?: Yes Get up at night to urinate?: Yes Leakage of urine?: Yes Urine stream starts and stops?: No Trouble starting stream?: Yes Do you have to strain to urinate?: Yes Blood in urine?: Yes Urinary tract infection?: Yes Sexually transmitted disease?: Yes Injury to kidneys or bladder?: Yes Painful intercourse?: Yes Weak stream?: Yes Currently pregnant?: Yes Vaginal bleeding?:  Yes  Gastrointestinal Nausea?: No Vomiting?: No Indigestion/heartburn?: No Diarrhea?: Yes Constipation?: Yes  Constitutional Fever: No Night sweats?: No Weight  loss?: No Fatigue?: Yes  Skin Skin rash/lesions?: No Itching?: Yes  Eyes Blurred vision?: Yes Double vision?: Yes  Ears/Nose/Throat Sore throat?: No Sinus problems?: Yes  Hematologic/Lymphatic Swollen glands?: No Easy bruising?: Yes  Cardiovascular Leg swelling?: No Chest pain?: Yes  Respiratory Cough?: No Shortness of breath?: Yes  Endocrine Excessive thirst?: Yes  Musculoskeletal Back pain?: Yes Joint pain?: Yes  Neurological Headaches?: Yes Dizziness?: Yes  Psychologic Depression?: Yes Anxiety?: Yes  Physical Exam: BP 121/71   Pulse 82   Wt 186 lb (84.4 kg)   BMI 30.95 kg/m   Constitutional:  Alert and oriented, No acute distress. HEENT: [place] AT, moist mucus membranes.  Trachea midline, no masses. Cardiovascular: No clubbing, cyanosis, or edema. Respiratory: Normal respiratory effort, no increased work of breathing. GI: Abdomen is soft, nontender, nondistended, no abdominal masses GU: Exam chaperoned by MA today, normal external genitalia.  Atrophic vaginitis.  Fairly good vaginal vault support without significant prolapse.  Small urethral carbuncle noted.  Slight hypermobility with Valsalva without demonstrable urinary incontinence. Skin: No rashes, bruises or suspicious lesions. Neurologic: Grossly intact, no focal deficits, moving all 4 extremities. Psychiatric: Normal mood and affect.  Laboratory Data: Lab Results  Component Value Date   WBC 7.7 12/23/2018   HGB 11.4 (L) 12/23/2018   HCT 36.5 12/23/2018   MCV 102.5 (H) 12/23/2018   PLT 330 12/23/2018    Lab Results  Component Value Date   CREATININE 0.63 12/23/2018    Lab Results  Component Value Date   HGBA1C 7.8 (H) 12/20/2018    Urinalysis Results for orders placed or performed in visit on 11/29/18  Microscopic  Examination  Result Value Ref Range   WBC, UA 0-5 0 - 5 /hpf   RBC, UA None seen 0 - 2 /hpf   Epithelial Cells (non renal) 0-10 0 - 10 /hpf   Renal Epithel, UA 0-10 (A) None seen /hpf   Casts Present (A) None seen /lpf   Cast Type Hyaline casts N/A   Mucus, UA Present (A) Not Estab.   Bacteria, UA Many (A) None seen/Few  Urinalysis, Complete  Result Value Ref Range   Specific Gravity, UA 1.025 1.005 - 1.030   pH, UA 5.0 5.0 - 7.5   Color, UA Yellow Yellow   Appearance Ur Cloudy (A) Clear   Leukocytes, UA Trace (A) Negative   Protein, UA Negative Negative/Trace   Glucose, UA 1+ (A) Negative   Ketones, UA Negative Negative   RBC, UA Negative Negative   Bilirubin, UA Negative Negative   Urobilinogen, Ur 0.2 0.2 - 1.0 mg/dL   Nitrite, UA Negative Negative   Microscopic Examination See below:     Pertinent Imaging: Results for orders placed during the hospital encounter of 09/10/18  CT Renal Stone Study   Narrative CLINICAL DATA:  71 year old female with acute abdominal and pelvic pain with fever.  EXAM: CT ABDOMEN AND PELVIS WITHOUT CONTRAST  TECHNIQUE: Multidetector CT imaging of the abdomen and pelvis was performed following the standard protocol without IV contrast.  COMPARISON:  06/22/2017 and prior CTs.  FINDINGS: Please note that parenchymal abnormalities may be missed without intravenous contrast.  Lower chest: No acute abnormality. Coronary artery calcifications again identified.  Hepatobiliary: The liver is unremarkable. The patient is status post cholecystectomy. No biliary dilatation.  Pancreas: Atrophic without other significant abnormality  Spleen: Unremarkable  Adrenals/Urinary Tract: The kidneys, bladder and adrenal glands are unremarkable.  Stomach/Bowel: Stomach is within normal limits. No evidence of bowel wall thickening, distention,  or inflammatory changes. A moderate to large amount of colonic stool is noted.  Vascular/Lymphatic: Aortic  atherosclerosis. No enlarged abdominal or pelvic lymph nodes.  Reproductive: Status post hysterectomy. No adnexal masses.  Other: No ascites, focal collection or pneumoperitoneum. A small umbilical hernia containing fat is again noted.  Musculoskeletal: Multilevel degenerative changes throughout the lumbar spine noted. No acute or suspicious bony abnormalities noted.  IMPRESSION: 1. No acute abnormality 2. Moderate to large amount of colonic stool. No evidence of bowel wall thickening, inflammation or bowel obstruction. 3. Multilevel lumbar spine degenerative changes again noted. 4.  Aortic Atherosclerosis (ICD10-I70.0).   Electronically Signed   By: Harmon Pier M.D.   On: 09/10/2018 14:57    CT scan personally reviewed.  No abnormal GU findings.  Assessment & Plan:    1. Recurrent UTI Start probiotic for UTI prevention Increase cranberry to twice daily  Start premarin cream three times weekly per vagina/ urethra three times per week, Monday/ Wed/ and Friday Please call our office for same day visit when you have UTI symptoms for catheterized UA Stop daily suppressive abx for the purpose of abx steward ship - Urinalysis, Complete  2. OAB (overactive bladder) ? Chronic Gala Murdoch Advised to take a break from this medication to see if it is truly benefiting her in terms of symptoms control Records from previous urologist requested  3. Urethral caruncle Dental, benign-appearing We will treat with topical estrogen as above  4. Atrophic vaginitis Postmenopausal, would bit from topical estrogen Risk and benefits reviewed   Return in about 6 months (around 05/31/2019) for recheck.  Vanna Scotland, MD  [facility] [address] [place], [place] [address] [phone]   I spent 45 min with this patient of which greater than 50% was spent in counseling and coordination of care with the patient.

## 2018-11-30 ENCOUNTER — Ambulatory Visit: Payer: Self-pay

## 2018-11-30 ENCOUNTER — Other Ambulatory Visit: Payer: Self-pay

## 2018-11-30 LAB — URINALYSIS, COMPLETE
BILIRUBIN UA: NEGATIVE
Ketones, UA: NEGATIVE
Nitrite, UA: NEGATIVE
PH UA: 5 (ref 5.0–7.5)
Protein, UA: NEGATIVE
RBC, UA: NEGATIVE
Specific Gravity, UA: 1.025 (ref 1.005–1.030)
Urobilinogen, Ur: 0.2 mg/dL (ref 0.2–1.0)

## 2018-11-30 LAB — MICROSCOPIC EXAMINATION: RBC MICROSCOPIC, UA: NONE SEEN /HPF (ref 0–2)

## 2018-11-30 NOTE — Patient Outreach (Signed)
[facility] Care Management  11/30/2018  Mellody DanceBarbara Bussey 1946/02/07 [id number]  Successful outreach call to Ms. Mannor. HIPAA identifiers verified.   Medication Management: [name] reports that she went to see a Urologist yesterday and states that " I really like my new doctor."  The doctor recommended that she increase her cranberry gummies to twice daily, start a probiotic and start premarin vaginal gel on M/W/F.  Call placed to her pharmacy to request that they deliver a new bottle of gummies and a bottle of probiotic when they deliver her prescription today.  I counseled the patient on her new medications.   Patient understands that she needs to call urologist for appointment next time she feels like a UTI is starting.     Patient states that she has no further medication questions or concerns.  Informed her that I will close her [facility] case at this time.  She has my phone number if she needs to contact me in the future.  Plan: Route case closure letter to PCP, Dr. Emilia BeckMcConville.   Berlin HunJennifer Anisa Leanos, PharmD Clinical Pharmacist [facility] [phone]

## 2018-12-01 ENCOUNTER — Other Ambulatory Visit: Payer: Self-pay | Admitting: Urology

## 2018-12-06 ENCOUNTER — Ambulatory Visit: Payer: PPO | Admitting: Podiatry

## 2018-12-19 ENCOUNTER — Inpatient Hospital Stay: Payer: PPO

## 2018-12-19 ENCOUNTER — Emergency Department: Payer: PPO

## 2018-12-19 ENCOUNTER — Other Ambulatory Visit: Payer: Self-pay

## 2018-12-19 ENCOUNTER — Inpatient Hospital Stay
Admission: EM | Admit: 2018-12-19 | Discharge: 2018-12-23 | DRG: 580 | Disposition: A | Discharge status: Skilled Nursing Facility | Payer: PPO | Attending: Internal Medicine | Admitting: Internal Medicine

## 2018-12-19 DIAGNOSIS — I2510 Atherosclerotic heart disease of native coronary artery without angina pectoris: Secondary | ICD-10-CM | POA: Diagnosis present

## 2018-12-19 DIAGNOSIS — G8929 Other chronic pain: Secondary | ICD-10-CM | POA: Diagnosis not present

## 2018-12-19 DIAGNOSIS — Z79899 Other long term (current) drug therapy: Secondary | ICD-10-CM | POA: Diagnosis not present

## 2018-12-19 DIAGNOSIS — Z882 Allergy status to sulfonamides status: Secondary | ICD-10-CM | POA: Diagnosis not present

## 2018-12-19 DIAGNOSIS — M86171 Other acute osteomyelitis, right ankle and foot: Secondary | ICD-10-CM | POA: Diagnosis not present

## 2018-12-19 DIAGNOSIS — Z880 Allergy status to penicillin: Secondary | ICD-10-CM | POA: Diagnosis not present

## 2018-12-19 DIAGNOSIS — E1140 Type 2 diabetes mellitus with diabetic neuropathy, unspecified: Secondary | ICD-10-CM | POA: Diagnosis present

## 2018-12-19 DIAGNOSIS — Z89422 Acquired absence of other left toe(s): Secondary | ICD-10-CM

## 2018-12-19 DIAGNOSIS — Z7982 Long term (current) use of aspirin: Secondary | ICD-10-CM | POA: Diagnosis not present

## 2018-12-19 DIAGNOSIS — Z9861 Coronary angioplasty status: Secondary | ICD-10-CM

## 2018-12-19 DIAGNOSIS — I739 Peripheral vascular disease, unspecified: Secondary | ICD-10-CM | POA: Diagnosis not present

## 2018-12-19 DIAGNOSIS — F064 Anxiety disorder due to known physiological condition: Secondary | ICD-10-CM | POA: Diagnosis not present

## 2018-12-19 DIAGNOSIS — E119 Type 2 diabetes mellitus without complications: Secondary | ICD-10-CM | POA: Diagnosis not present

## 2018-12-19 DIAGNOSIS — M869 Osteomyelitis, unspecified: Secondary | ICD-10-CM | POA: Diagnosis not present

## 2018-12-19 DIAGNOSIS — E785 Hyperlipidemia, unspecified: Secondary | ICD-10-CM | POA: Diagnosis present

## 2018-12-19 DIAGNOSIS — F339 Major depressive disorder, recurrent, unspecified: Secondary | ICD-10-CM | POA: Diagnosis not present

## 2018-12-19 DIAGNOSIS — L03031 Cellulitis of right toe: Secondary | ICD-10-CM | POA: Diagnosis not present

## 2018-12-19 DIAGNOSIS — Z66 Do not resuscitate: Secondary | ICD-10-CM | POA: Diagnosis present

## 2018-12-19 DIAGNOSIS — L03115 Cellulitis of right lower limb: Secondary | ICD-10-CM | POA: Diagnosis not present

## 2018-12-19 DIAGNOSIS — Z888 Allergy status to other drugs, medicaments and biological substances status: Secondary | ICD-10-CM

## 2018-12-19 DIAGNOSIS — Z8673 Personal history of transient ischemic attack (TIA), and cerebral infarction without residual deficits: Secondary | ICD-10-CM | POA: Diagnosis not present

## 2018-12-19 DIAGNOSIS — E1169 Type 2 diabetes mellitus with other specified complication: Secondary | ICD-10-CM | POA: Diagnosis not present

## 2018-12-19 DIAGNOSIS — R1312 Dysphagia, oropharyngeal phase: Secondary | ICD-10-CM | POA: Diagnosis not present

## 2018-12-19 DIAGNOSIS — R52 Pain, unspecified: Secondary | ICD-10-CM | POA: Diagnosis not present

## 2018-12-19 DIAGNOSIS — I10 Essential (primary) hypertension: Secondary | ICD-10-CM | POA: Diagnosis not present

## 2018-12-19 DIAGNOSIS — K219 Gastro-esophageal reflux disease without esophagitis: Secondary | ICD-10-CM | POA: Diagnosis not present

## 2018-12-19 DIAGNOSIS — Z887 Allergy status to serum and vaccine status: Secondary | ICD-10-CM | POA: Diagnosis not present

## 2018-12-19 DIAGNOSIS — L089 Local infection of the skin and subcutaneous tissue, unspecified: Secondary | ICD-10-CM | POA: Diagnosis present

## 2018-12-19 DIAGNOSIS — D638 Anemia in other chronic diseases classified elsewhere: Secondary | ICD-10-CM | POA: Diagnosis not present

## 2018-12-19 DIAGNOSIS — Z885 Allergy status to narcotic agent status: Secondary | ICD-10-CM | POA: Diagnosis not present

## 2018-12-19 DIAGNOSIS — Z7401 Bed confinement status: Secondary | ICD-10-CM | POA: Diagnosis not present

## 2018-12-19 DIAGNOSIS — Z794 Long term (current) use of insulin: Secondary | ICD-10-CM

## 2018-12-19 DIAGNOSIS — G4730 Sleep apnea, unspecified: Secondary | ICD-10-CM | POA: Diagnosis not present

## 2018-12-19 DIAGNOSIS — F329 Major depressive disorder, single episode, unspecified: Secondary | ICD-10-CM | POA: Diagnosis not present

## 2018-12-19 DIAGNOSIS — M79674 Pain in right toe(s): Secondary | ICD-10-CM | POA: Diagnosis not present

## 2018-12-19 DIAGNOSIS — Z8249 Family history of ischemic heart disease and other diseases of the circulatory system: Secondary | ICD-10-CM

## 2018-12-19 DIAGNOSIS — M6281 Muscle weakness (generalized): Secondary | ICD-10-CM | POA: Diagnosis not present

## 2018-12-19 DIAGNOSIS — M6282 Rhabdomyolysis: Secondary | ICD-10-CM | POA: Diagnosis not present

## 2018-12-19 DIAGNOSIS — M2550 Pain in unspecified joint: Secondary | ICD-10-CM | POA: Diagnosis not present

## 2018-12-19 DIAGNOSIS — I252 Old myocardial infarction: Secondary | ICD-10-CM | POA: Diagnosis not present

## 2018-12-19 DIAGNOSIS — F419 Anxiety disorder, unspecified: Secondary | ICD-10-CM | POA: Diagnosis present

## 2018-12-19 LAB — CBC WITH DIFFERENTIAL/PLATELET
Abs Immature Granulocytes: 0.05 10*3/uL (ref 0.00–0.07)
BASOS ABS: 0 10*3/uL (ref 0.0–0.1)
Basophils Relative: 0 %
EOS ABS: 0.1 10*3/uL (ref 0.0–0.5)
Eosinophils Relative: 1 %
HCT: 38.7 % (ref 36.0–46.0)
Hemoglobin: 12.4 g/dL (ref 12.0–15.0)
Immature Granulocytes: 1 %
LYMPHS ABS: 1.6 10*3/uL (ref 0.7–4.0)
Lymphocytes Relative: 15 %
MCH: 32 pg (ref 26.0–34.0)
MCHC: 32 g/dL (ref 30.0–36.0)
MCV: 100 fL (ref 80.0–100.0)
Monocytes Absolute: 1 10*3/uL (ref 0.1–1.0)
Monocytes Relative: 9 %
NRBC: 0 % (ref 0.0–0.2)
Neutro Abs: 8.1 10*3/uL — ABNORMAL HIGH (ref 1.7–7.7)
Neutrophils Relative %: 74 %
Platelets: 394 10*3/uL (ref 150–400)
RBC: 3.87 MIL/uL (ref 3.87–5.11)
RDW: 12.4 % (ref 11.5–15.5)
WBC: 10.9 10*3/uL — ABNORMAL HIGH (ref 4.0–10.5)

## 2018-12-19 LAB — URINALYSIS, COMPLETE (UACMP) WITH MICROSCOPIC
Bacteria, UA: NONE SEEN
Bilirubin Urine: NEGATIVE
Glucose, UA: 50 mg/dL — AB
Hgb urine dipstick: NEGATIVE
Ketones, ur: 5 mg/dL — AB
Nitrite: NEGATIVE
Protein, ur: NEGATIVE mg/dL
Specific Gravity, Urine: 1.029 (ref 1.005–1.030)
pH: 5 (ref 5.0–8.0)

## 2018-12-19 LAB — COMPREHENSIVE METABOLIC PANEL WITH GFR
ALT: 16 U/L (ref 0–44)
AST: 28 U/L (ref 15–41)
Albumin: 3.9 g/dL (ref 3.5–5.0)
Alkaline Phosphatase: 78 U/L (ref 38–126)
Anion gap: 12 (ref 5–15)
BUN: 19 mg/dL (ref 8–23)
CO2: 24 mmol/L (ref 22–32)
Calcium: 9.4 mg/dL (ref 8.9–10.3)
Chloride: 99 mmol/L (ref 98–111)
Creatinine, Ser: 0.73 mg/dL (ref 0.44–1.00)
GFR calc Af Amer: >60 mL/min (ref >60)
GFR calc non Af Amer: >60 mL/min (ref >60)
Glucose, Bld: 235 mg/dL — ABNORMAL HIGH (ref 70–99)
Potassium: 4 mmol/L (ref 3.5–5.1)
Sodium: 135 mmol/L (ref 135–145)
Total Bilirubin: 0.3 mg/dL (ref 0.3–1.2)
Total Protein: 7.1 g/dL (ref 6.5–8.1)

## 2018-12-19 LAB — LACTIC ACID, PLASMA
Lactic Acid, Venous: 2.2 mmol/L (ref 0.5–1.9)
Lactic Acid, Venous: 1.8 mmol/L (ref 0.5–1.9)

## 2018-12-19 MED ORDER — ASPIRIN EC 81 MG PO TBEC
81.0000 mg | DELAYED_RELEASE_TABLET | Freq: Every day | ORAL | Status: DC
  Administered 2018-12-20 – 2018-12-23 (×4): 81 mg via ORAL
  Filled 2018-12-19 (×4): qty 1

## 2018-12-19 MED ORDER — ONDANSETRON HCL 4 MG/2ML IJ SOLN
4.0000 mg | Freq: Once | INTRAMUSCULAR | Status: AC
  Administered 2018-12-19: 4 mg via INTRAVENOUS
  Filled 2018-12-19: qty 2

## 2018-12-19 MED ORDER — ACETAMINOPHEN 650 MG RE SUPP: 650.0000 mg | Freq: Four times a day (QID) | RECTAL | Status: DC | PRN

## 2018-12-19 MED ORDER — TRAZODONE HCL 100 MG PO TABS
100.0000 mg | ORAL_TABLET | Freq: Every day | ORAL | Status: DC
  Administered 2018-12-19 – 2018-12-22 (×4): 100 mg via ORAL
  Filled 2018-12-19 (×4): qty 1

## 2018-12-19 MED ORDER — SODIUM CHLORIDE 0.9 % IV SOLN
250.0000 mL | INTRAVENOUS | Status: DC | PRN
  Administered 2018-12-20 – 2018-12-21 (×2): 250 mL via INTRAVENOUS

## 2018-12-19 MED ORDER — CELECOXIB 200 MG PO CAPS
200.0000 mg | ORAL_CAPSULE | Freq: Every day | ORAL | Status: DC
  Administered 2018-12-20 – 2018-12-23 (×4): 200 mg via ORAL
  Filled 2018-12-19 (×4): qty 1

## 2018-12-19 MED ORDER — VANCOMYCIN HCL IN DEXTROSE 1-5 GM/200ML-% IV SOLN
1000.0000 mg | Freq: Two times a day (BID) | INTRAVENOUS | Status: DC
  Administered 2018-12-20 – 2018-12-23 (×7): 1000 mg via INTRAVENOUS
  Filled 2018-12-19 (×10): qty 200

## 2018-12-19 MED ORDER — ACETAMINOPHEN 500 MG PO TABS
1000.0000 mg | ORAL_TABLET | Freq: Three times a day (TID) | ORAL | Status: DC | PRN
  Administered 2018-12-21 – 2018-12-22 (×3): 1000 mg via ORAL
  Filled 2018-12-19 (×3): qty 2

## 2018-12-19 MED ORDER — PANTOPRAZOLE SODIUM 40 MG PO TBEC
40.0000 mg | DELAYED_RELEASE_TABLET | Freq: Every day | ORAL | Status: DC
  Administered 2018-12-20 – 2018-12-23 (×4): 40 mg via ORAL
  Filled 2018-12-19 (×4): qty 1

## 2018-12-19 MED ORDER — ACETAMINOPHEN 325 MG PO TABS: 650.0000 mg | ORAL_TABLET | Freq: Four times a day (QID) | ORAL | Status: DC | PRN

## 2018-12-19 MED ORDER — INSULIN ASPART 100 UNIT/ML ~~LOC~~ SOLN: 0.0000 [IU] | Freq: Three times a day (TID) | SUBCUTANEOUS | Status: DC

## 2018-12-19 MED ORDER — DIPHENOXYLATE-ATROPINE 2.5-0.025 MG PO TABS: 1.0000 | ORAL_TABLET | ORAL | Status: DC | PRN

## 2018-12-19 MED ORDER — ESTROGENS, CONJUGATED 0.625 MG/GM VA CREA
1.0000 | TOPICAL_CREAM | VAGINAL | Status: DC
  Administered 2018-12-21: 1 via VAGINAL
  Filled 2018-12-19: qty 30

## 2018-12-19 MED ORDER — MORPHINE SULFATE 15 MG PO TABS
15.0000 mg | ORAL_TABLET | Freq: Three times a day (TID) | ORAL | Status: DC
  Administered 2018-12-19 – 2018-12-23 (×9): 15 mg via ORAL
  Filled 2018-12-19 (×11): qty 1

## 2018-12-19 MED ORDER — PREGABALIN 75 MG PO CAPS
150.0000 mg | ORAL_CAPSULE | Freq: Two times a day (BID) | ORAL | Status: DC
  Administered 2018-12-19 – 2018-12-23 (×8): 150 mg via ORAL
  Filled 2018-12-19 (×8): qty 2

## 2018-12-19 MED ORDER — NITROGLYCERIN 0.4 MG SL SUBL: 0.4000 mg | SUBLINGUAL_TABLET | SUBLINGUAL | Status: DC | PRN

## 2018-12-19 MED ORDER — ISOSORBIDE MONONITRATE ER 30 MG PO TB24
30.0000 mg | ORAL_TABLET | Freq: Every day | ORAL | Status: DC
  Administered 2018-12-20 – 2018-12-23 (×4): 30 mg via ORAL
  Filled 2018-12-19 (×4): qty 1

## 2018-12-19 MED ORDER — ONDANSETRON HCL 4 MG/2ML IJ SOLN
4.0000 mg | Freq: Four times a day (QID) | INTRAMUSCULAR | Status: DC | PRN
  Administered 2018-12-21: 4 mg via INTRAVENOUS

## 2018-12-19 MED ORDER — SODIUM CHLORIDE 0.9% FLUSH: 3.0000 mL | INTRAVENOUS | Status: DC | PRN

## 2018-12-19 MED ORDER — ENOXAPARIN SODIUM 40 MG/0.4ML ~~LOC~~ SOLN
40.0000 mg | SUBCUTANEOUS | Status: DC
  Filled 2018-12-19 (×5): qty 0.4

## 2018-12-19 MED ORDER — VANCOMYCIN HCL IN DEXTROSE 1-5 GM/200ML-% IV SOLN
1000.0000 mg | Freq: Once | INTRAVENOUS | Status: AC
  Administered 2018-12-20: 1000 mg via INTRAVENOUS
  Filled 2018-12-19 (×2): qty 200

## 2018-12-19 MED ORDER — INSULIN GLARGINE 100 UNIT/ML ~~LOC~~ SOLN
15.0000 [IU] | Freq: Every day | SUBCUTANEOUS | Status: DC
  Administered 2018-12-20 – 2018-12-23 (×4): 15 [IU] via SUBCUTANEOUS
  Filled 2018-12-19 (×5): qty 0.15

## 2018-12-19 MED ORDER — ACETAMINOPHEN 325 MG PO TABS
650.0000 mg | ORAL_TABLET | Freq: Once | ORAL | Status: AC
  Administered 2018-12-19: 650 mg via ORAL
  Filled 2018-12-19: qty 2

## 2018-12-19 MED ORDER — SODIUM CHLORIDE 0.9 % IV SOLN
2.0000 g | Freq: Three times a day (TID) | INTRAVENOUS | Status: DC
  Administered 2018-12-19 – 2018-12-23 (×12): 2 g via INTRAVENOUS
  Filled 2018-12-19 (×16): qty 2

## 2018-12-19 MED ORDER — MORPHINE SULFATE ER 15 MG PO TBCR
15.0000 mg | EXTENDED_RELEASE_TABLET | Freq: Two times a day (BID) | ORAL | Status: DC
  Administered 2018-12-19 – 2018-12-23 (×8): 15 mg via ORAL
  Filled 2018-12-19 (×8): qty 1

## 2018-12-19 MED ORDER — ONDANSETRON HCL 4 MG PO TABS: 4.0000 mg | ORAL_TABLET | Freq: Four times a day (QID) | ORAL | Status: DC | PRN

## 2018-12-19 MED ORDER — GADOBUTROL 1 MMOL/ML IV SOLN
8.5000 mL | Freq: Once | INTRAVENOUS | Status: AC | PRN
  Administered 2018-12-19: 8.5 mL via INTRAVENOUS

## 2018-12-19 MED ORDER — VENLAFAXINE HCL 37.5 MG PO TABS
75.0000 mg | ORAL_TABLET | Freq: Three times a day (TID) | ORAL | Status: DC
  Administered 2018-12-19 – 2018-12-23 (×11): 75 mg via ORAL
  Filled 2018-12-19 (×13): qty 2

## 2018-12-19 MED ORDER — VITAMIN B-12 1000 MCG PO TABS
5000.0000 ug | ORAL_TABLET | Freq: Every day | ORAL | Status: DC
  Administered 2018-12-20 – 2018-12-23 (×4): 5000 ug via ORAL
  Filled 2018-12-19 (×4): qty 5

## 2018-12-19 MED ORDER — FESOTERODINE FUMARATE ER 8 MG PO TB24
8.0000 mg | ORAL_TABLET | Freq: Every day | ORAL | Status: DC
  Administered 2018-12-20 – 2018-12-22 (×3): 8 mg via ORAL
  Filled 2018-12-19 (×5): qty 1

## 2018-12-19 MED ORDER — SODIUM CHLORIDE 0.9% FLUSH
3.0000 mL | Freq: Two times a day (BID) | INTRAVENOUS | Status: DC
  Administered 2018-12-22 – 2018-12-23 (×3): 3 mL via INTRAVENOUS

## 2018-12-19 MED ORDER — CYCLOBENZAPRINE HCL 10 MG PO TABS
10.0000 mg | ORAL_TABLET | Freq: Three times a day (TID) | ORAL | Status: DC | PRN
  Administered 2018-12-20 – 2018-12-22 (×2): 10 mg via ORAL
  Filled 2018-12-19 (×2): qty 1

## 2018-12-19 MED ORDER — METOPROLOL TARTRATE 25 MG PO TABS
25.0000 mg | ORAL_TABLET | Freq: Two times a day (BID) | ORAL | Status: DC
  Administered 2018-12-19 – 2018-12-23 (×6): 25 mg via ORAL
  Filled 2018-12-19 (×8): qty 1

## 2018-12-19 MED ORDER — ATORVASTATIN CALCIUM 20 MG PO TABS
20.0000 mg | ORAL_TABLET | Freq: Every day | ORAL | Status: DC
  Administered 2018-12-19 – 2018-12-22 (×4): 20 mg via ORAL
  Filled 2018-12-19 (×4): qty 1

## 2018-12-19 MED ORDER — ALPRAZOLAM 0.5 MG PO TABS
1.0000 mg | ORAL_TABLET | Freq: Three times a day (TID) | ORAL | Status: DC
  Administered 2018-12-19 – 2018-12-23 (×11): 1 mg via ORAL
  Filled 2018-12-19 (×11): qty 2

## 2018-12-19 MED ORDER — MIRTAZAPINE 15 MG PO TABS
15.0000 mg | ORAL_TABLET | Freq: Every day | ORAL | Status: DC
  Administered 2018-12-19 – 2018-12-22 (×4): 15 mg via ORAL
  Filled 2018-12-19 (×4): qty 1

## 2018-12-19 MED ORDER — INSULIN ASPART 100 UNIT/ML ~~LOC~~ SOLN
0.0000 [IU] | Freq: Three times a day (TID) | SUBCUTANEOUS | Status: DC
  Administered 2018-12-20: 5 [IU] via SUBCUTANEOUS
  Administered 2018-12-20: 2 [IU] via SUBCUTANEOUS
  Administered 2018-12-20: 7 [IU] via SUBCUTANEOUS
  Administered 2018-12-20 – 2018-12-21 (×2): 5 [IU] via SUBCUTANEOUS
  Administered 2018-12-21: 3 [IU] via SUBCUTANEOUS
  Administered 2018-12-21: 1 [IU] via SUBCUTANEOUS
  Administered 2018-12-22: 9 [IU] via SUBCUTANEOUS
  Administered 2018-12-22: 2 [IU] via SUBCUTANEOUS
  Administered 2018-12-22: 3 [IU] via SUBCUTANEOUS
  Administered 2018-12-23: 7 [IU] via SUBCUTANEOUS
  Administered 2018-12-23: 2 [IU] via SUBCUTANEOUS
  Filled 2018-12-19 (×13): qty 1

## 2018-12-19 MED ORDER — LISINOPRIL 20 MG PO TABS
40.0000 mg | ORAL_TABLET | Freq: Every day | ORAL | Status: DC
  Administered 2018-12-21 – 2018-12-23 (×3): 40 mg via ORAL
  Filled 2018-12-19 (×3): qty 2
  Filled 2018-12-19: qty 4

## 2018-12-19 MED ORDER — FERROUS SULFATE 325 (65 FE) MG PO TABS
325.0000 mg | ORAL_TABLET | Freq: Every day | ORAL | Status: DC
  Administered 2018-12-20 – 2018-12-23 (×4): 325 mg via ORAL
  Filled 2018-12-19 (×4): qty 1

## 2018-12-19 MED ORDER — FENTANYL CITRATE (PF) 100 MCG/2ML IJ SOLN
50.0000 ug | Freq: Once | INTRAMUSCULAR | Status: AC
  Administered 2018-12-19: 50 ug via INTRAVENOUS
  Filled 2018-12-19: qty 2

## 2018-12-19 NOTE — ED Notes (Signed)
ED Provider at bedside. 

## 2018-12-19 NOTE — ED Notes (Signed)
Patient transported to MRI 

## 2018-12-19 NOTE — ED Notes (Signed)
ED TO INPATIENT HANDOFF REPORT  Name/Age/Gender Jessica Donovan 72 y.o. female  Code Status Code Status History    Date Active Date Inactive Code Status Order ID Comments User Context   09/11/2018 1748 09/13/2018 1748 DNR [id number]  Altamese Dilling, MD Inpatient   09/10/2018 1635 09/11/2018 1748 Full Code [id number]  Ramonita Lab, MD Inpatient   09/10/2018 1620 09/10/2018 1635 Full Code [id number]  Ramonita Lab, MD ED   07/07/2018 0220 07/07/2018 1910 DNR [id number]  Cammy Copa, MD Inpatient   06/10/2017 1446 06/10/2017 2022 DNR [id number]  Delfino Lovett, MD Inpatient   06/09/2017 1743 06/10/2017 1446 Full Code [id number]  Enid Baas, MD Inpatient   05/19/2017 2144 05/23/2017 1719 DNR [id number]  Oralia Manis, MD Inpatient   02/23/2017 1640 03/01/2017 1902 DNR [id number]  Altamese Dilling, MD Inpatient   01/17/2017 0407 01/19/2017 2010 DNR [id number]  Arnaldo Natal, MD Inpatient   04/09/2016 0311 04/10/2016 2044 DNR [id number]  Arnaldo Natal, MD Inpatient   04/09/2016 0205 04/09/2016 0311 Full Code [id number]  Oralia Manis, MD Inpatient   04/03/2016 1853 04/04/2016 1817 Full Code [id number]  Houston Siren, MD Inpatient   02/23/2016 2252 02/25/2016 1637 DNR [id number]  Robley Fries, MD Inpatient   01/24/2016 1554 01/27/2016 1906 DNR [id number]  Milagros Loll, MD Inpatient   01/23/2016 1607 01/23/2016 1622 Full Code [id number]  Shaune Pollack, MD Inpatient   12/07/2015 2223 12/09/2015 1858 Full Code [id number]  Auburn Bilberry, MD Inpatient   11/29/2015 1741 12/01/2015 1833 DNR [id number]  Shaune Pollack, MD Inpatient   11/10/2015 0954 11/12/2015 2139 DNR [id number]  Linus Galas, MD Inpatient   11/07/2015 1732 11/10/2015 0954 DNR [id number]  Altamese Dilling, MD Inpatient   11/07/2015 1549 11/07/2015 1732 Full Code [id number]  Altamese Dilling, MD Inpatient    Questions for Most Recent Historical Code Status (Order [id number])    Question Answer Comment   In the event of cardiac or respiratory ARREST  Do not call a "code blue"    In the event of cardiac or respiratory ARREST Do not perform Intubation, CPR, defibrillation or ACLS    In the event of cardiac or respiratory ARREST Use medication by any route, position, wound care, and other measures to relive pain and suffering. May use oxygen, suction and manual treatment of airway obstruction as needed for comfort.       Home/SNF/Other home  Chief Complaint Toe Pain  Level of Care/Admitting Diagnosis ED Disposition    ED Disposition Condition Comment   Admit  Hospital Area: [facility] [[id number]]  Level of Care: Med-Surg [16]  Diagnosis: Right foot infection [093267]  Admitting Physician: Auburn Bilberry [[id number]]  Attending Physician: Auburn Bilberry [[id number]]  Estimated length of stay: past midnight tomorrow  Certification:: I certify this patient will need inpatient services for at least 2 midnights  PT Class (Do Not Modify): Inpatient [101]  PT Acc Code (Do Not Modify): Private [1]       Medical History Past Medical History:  Diagnosis Date  . Anxiety   . Chronic back pain   . Collagen vascular disease (HCC)   . Coronary artery disease    a. s/p PCI/DES to dRCA & mLCx in 2014 with repeat LHC in 10/2015 showing patent stents  . Diabetes mellitus without complication (HCC)   . Hypertension   . Low back pain   . Myocardial infarction (HCC)   . Osteomyelitis of toe (HCC) 01/23/2016  . Stroke [facility])     Allergies Allergies  Allergen Reactions  .  Bactrim [Sulfamethoxazole-Trimethoprim]   . Codeine Nausea And Vomiting  . Flu Virus Vaccine     Other reaction(s): Other (See Comments) Passed out for 12 days  . Influenza Vaccines Other (See Comments)    Reaction:  Caused pt to pass out   . Methadone Hives and Itching  . Oxycodone-Acetaminophen Nausea And Vomiting  . Percocet [Oxycodone-Acetaminophen] Nausea And Vomiting  . Sulfa Antibiotics   . Tetanus Antitoxin   . Tetanus Toxoids Swelling and Other  (See Comments)    Reaction:  Swelling at injection site  . Penicillin G Rash    Has patient had a PCN reaction causing immediate rash, facial/tongue/throat swelling, SOB or lightheadedness with hypotension: Yes Has patient had a PCN reaction causing severe rash involving mucus membranes or skin necrosis: No Has patient had a PCN reaction that required hospitalization: No Has patient had a PCN reaction occurring within the last 10 years: No If all of the above answers are "NO", then may proceed with Cephalosporin use..  . Tetracyclines & Related Rash    IV Location/Drains/Wounds Patient Lines/Drains/Airways Status   Active Line/Drains/Airways    Name:   Placement date:   Placement time:   Site:   Days:   Peripheral IV 12/19/18 Right Wrist   12/19/18    1919    Wrist   less than 1          Labs/Imaging Results for orders placed or performed during the hospital encounter of 12/19/18 (from the past 48 hour(s))  Lactic acid, plasma     Status: Abnormal   Collection Time: 12/19/18  4:37 PM  Result Value Ref Range   Lactic Acid, Venous 2.2 (HH) 0.5 - 1.9 mmol/L    Comment: CRITICAL RESULT CALLED TO, READ BACK BY AND VERIFIED WITH AMY COYNE  AT 1727 ON 12/19/18 BY SNJ Performed at [facility], [address] [place], [place] [address]   Comprehensive metabolic panel     Status: Abnormal   Collection Time: 12/19/18  4:38 PM  Result Value Ref Range   Sodium 135 135 - 145 mmol/L   Potassium 4.0 3.5 - 5.1 mmol/L   Chloride 99 98 - 111 mmol/L   CO2 24 22 - 32 mmol/L   Glucose, Bld 235 (H) 70 - 99 mg/dL   BUN 19 8 - 23 mg/dL   Creatinine, Ser 6.040.73 0.44 - 1.00 mg/dL   Calcium 9.4 8.9 - 54.010.3 mg/dL   Total Protein 7.1 6.5 - 8.1 g/dL   Albumin 3.9 3.5 - 5.0 g/dL   AST 28 15 - 41 U/L   ALT 16 0 - 44 U/L   Alkaline Phosphatase 78 38 - 126 U/L   Total Bilirubin 0.3 0.3 - 1.2 mg/dL   GFR calc non Af Amer >60 >60 mL/min   GFR calc Af Amer >60 >60 mL/min   Anion gap 12 5 - 15     Comment: Performed at [facility], [address] [place], [place] [address]  CBC with Differential     Status: Abnormal   Collection Time: 12/19/18  4:38 PM  Result Value Ref Range   WBC 10.9 (H) 4.0 - 10.5 K/uL   RBC 3.87 3.87 - 5.11 MIL/uL   Hemoglobin 12.4 12.0 - 15.0 g/dL   HCT 14.738.7 82.936.0 - 56.246.0 %   MCV 100.0 80.0 - 100.0 fL   MCH 32.0 26.0 - 34.0 pg   MCHC 32.0 30.0 - 36.0 g/dL   RDW 13.012.4 86.511.5 - 78.415.5 %  Platelets 394 150 - 400 K/uL   nRBC 0.0 0.0 - 0.2 %   Neutrophils Relative % 74 %   Neutro Abs 8.1 (H) 1.7 - 7.7 K/uL   Lymphocytes Relative 15 %   Lymphs Abs 1.6 0.7 - 4.0 K/uL   Monocytes Relative 9 %   Monocytes Absolute 1.0 0.1 - 1.0 K/uL   Eosinophils Relative 1 %   Eosinophils Absolute 0.1 0.0 - 0.5 K/uL   Basophils Relative 0 %   Basophils Absolute 0.0 0.0 - 0.1 K/uL   Immature Granulocytes 1 %   Abs Immature Granulocytes 0.05 0.00 - 0.07 K/uL    Comment: Performed at [facility], [address] [place], [place] [address]  Lactic acid, plasma     Status: None   Collection Time: 12/19/18  7:18 PM  Result Value Ref Range   Lactic Acid, Venous 1.8 0.5 - 1.9 mmol/L    Comment: Performed at [facility], [address] [place], [place] [address]   Dg Foot Complete Right  Result Date: 12/19/2018 CLINICAL DATA:  Total infection EXAM: RIGHT FOOT COMPLETE - 3+ VIEW COMPARISON:  06/22/2017 FINDINGS: No acute displaced fracture or malalignment. Chronic fracture deformity distal fifth metatarsal. Status post amputation first digit at the MTP joint. No periostitis or bone destruction. No soft tissue emphysema. IMPRESSION: No acute osseous abnormality. Electronically Signed   By: Jasmine PangKim  Fujinaga M.D.   On: 12/19/2018 19:51    Pending Labs Unresulted Labs (From admission, onward)    Start     Ordered   12/21/18 1630  Vancomycin, trough  Once-Timed,   STAT     12/19/18 2151   12/19/18 1951  Urinalysis, Complete w Microscopic  Once,   STAT      12/19/18 1950   Signed and Held  CBC  (enoxaparin (LOVENOX)    CrCl >/= 30 ml/min)  Once,   R    Comments:  Baseline for enoxaparin therapy IF NOT ALREADY DRAWN.  Notify MD if PLT < 100 K.    Signed and Held   Signed and Held  Creatinine, serum  (enoxaparin (LOVENOX)    CrCl >/= 30 ml/min)  Once,   R    Comments:  Baseline for enoxaparin therapy IF NOT ALREADY DRAWN.    Signed and Held   Signed and Held  Creatinine, serum  (enoxaparin (LOVENOX)    CrCl >/= 30 ml/min)  Weekly,   R    Comments:  while on enoxaparin therapy    Signed and Held   Signed and Held  CBC  Tomorrow morning,   R     Signed and Held   Signed and Held  Basic metabolic panel  Tomorrow morning,   R     Signed and Held          Vitals/Pain Today's Vitals   12/19/18 2100 12/19/18 2115 12/19/18 2134 12/19/18 2134  BP: 122/72   (!) 125/93  Pulse: 80 86  88  Resp: 16   17  Temp:    98.4 F (36.9 C)  TempSrc:      SpO2: 98% 100%  98%  Weight:      Height:      PainSc:   5      Isolation Precautions No active isolations  Medications Medications  vancomycin (VANCOCIN) IVPB 1000 mg/200 mL premix (has no administration in time range)  insulin aspart (novoLOG) injection 0-9 Units (has no administration in time range)  ceFEPIme (MAXIPIME) 2 g  in sodium chloride 0.9 % 100 mL IVPB (2 g Intravenous New Bag/Given 12/19/18 2132)  vancomycin (VANCOCIN) IVPB 1000 mg/200 mL premix (has no administration in time range)  fentaNYL (SUBLIMAZE) injection 50 mcg (50 mcg Intravenous Given 12/19/18 1944)  acetaminophen (TYLENOL) tablet 650 mg (650 mg Oral Given 12/19/18 1943)  ondansetron (ZOFRAN) injection 4 mg (4 mg Intravenous Given 12/19/18 1943)    Mobility independent

## 2018-12-19 NOTE — ED Triage Notes (Addendum)
Right foot second digit toe infection since Friday night. Swelling, redness, oozing and foul odor present. Pt diabetic and has had multiple toe amputations. Pt alert and oriented X4, active, cooperative, pt in NAD. RR even and unlabored, color WNL.

## 2018-12-19 NOTE — ED Provider Notes (Signed)
[facility] Emergency Department Provider Note  ____________________________________________   I have reviewed the triage vital signs and the nursing notes.   HISTORY  Chief Complaint Wound Infection   History limited by: Not Limited   HPI Jessica Donovan is a 72 y.o. female who presents to the emergency department today because of concerns for possible right second toe infection.  Patient states that she has had bad toes since a dog attack roughly 10 years ago.  She has had multiple surgeries and has had multiple amputations.  She states for the past 2 days she has noticed increased pain and redness to her right second toe.  Today it did burst open.  She stated it did have some drainage.  Patient denies any fevers, nausea or vomiting.   Per medical record review patient has a history of diabetes and multiple toe amputations  Past Medical History:  Diagnosis Date  . Anxiety   . Chronic back pain   . Collagen vascular disease (HCC)   . Coronary artery disease    a. s/p PCI/DES to dRCA & mLCx in 2014 with repeat LHC in 10/2015 showing patent stents  . Diabetes mellitus without complication (HCC)   . Hypertension   . Low back pain   . Myocardial infarction (HCC)   . Osteomyelitis of toe (HCC) 01/23/2016  . Stroke [facility])     Patient Active Problem List   Diagnosis Date Noted  . Acute encephalopathy 07/06/2018  . Anxiety 06/10/2017  . Chest pain with moderate risk for cardiac etiology 06/09/2017  . UTI (urinary tract infection) 05/19/2017  . CAD (coronary artery disease) 05/19/2017  . Chronic narcotic use 05/19/2017  . Cellulitis in diabetic foot (HCC) 05/19/2017  . Opioid overdose (HCC) 02/23/2017  . Altered mental status 02/23/2017  . Acute colitis 01/19/2017  . Sepsis (HCC) 01/17/2017  . Cellulitis of fourth toe of right foot 04/10/2016  . Somnolence 04/09/2016  . Dementia (HCC) 04/09/2016  . Cellulitis 04/03/2016  . Hematoma of right parietal  scalp 02/23/2016  . Multiple falls 02/23/2016  . Physical debility 02/23/2016  . Type II diabetes mellitus, uncontrolled (HCC) 02/23/2016  . Syncope and collapse 02/23/2016  . Polypharmacy 02/23/2016  . Osteomyelitis of toe (HCC) 01/23/2016  . Rhabdomyolysis 12/07/2015  . ARF (acute renal failure) (HCC) 11/29/2015  . Hypotension 11/29/2015  . 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 Angina effort 11/07/2015  . Osteomyelitis (HCC) 11/07/2015  . Anemia due to chronic blood loss 06/14/2015  . Generalized anxiety disorder 06/14/2015  . Lumbago with sciatica 06/14/2015  . 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  . 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 Abnormal weight loss 05/18/2014  . Chronic pain syndrome 05/18/2014  . 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 Diabetic polyneuropathy (HCC) 05/18/2014  . Type 2 diabetes mellitus with diabetic neuropathy (HCC) 05/07/2014  . Hemorrhage of rectum and anus 01/17/2014  . Cellulitis and abscess of foot excluding toe 05/18/2012    Past Surgical History:  Procedure Laterality Date  . AMPUTATION TOE Left 11/10/2015   Procedure: AMPUTATION TOE;  Surgeon: Linus Galas, MD;  Location: [facility] ORS;  Service: Podiatry;  Laterality: Left;  . AMPUTATION TOE Left 01/24/2016   Procedure: AMPUTATION TOE (2nd mpj);  Surgeon: Linus Galas, DPM;  Location: [facility] ORS;  Service: Podiatry;  Laterality: Left;  . CARDIAC CATHETERIZATION N/A 11/12/2015   Procedure: Left Heart Cath and  Coronary Angiography;  Surgeon: Marcina MillardAlexander Paraschos, MD;  [facility];  Service: Cardiovascular;  Laterality: N/A;  . COLONOSCOPY WITH PROPOFOL N/A 07/20/2017   Procedure: COLONOSCOPY WITH PROPOFOL;  Surgeon: Wyline MoodAnna, Kiran, MD;  [facility];  Service: Endoscopy;  Laterality: N/A;  . JOINT REPLACEMENT    . TOE AMPUTATION       Prior to Admission medications   Medication Sig Start Date End Date Taking? Authorizing Provider  acetaminophen (TYLENOL) 500 MG tablet Take 1,000 mg by mouth every 8 (eight) hours as needed for mild pain or headache.     [provider]  ALPRAZolam Prudy Feeler(XANAX) 1 MG tablet Take 1 mg by mouth 3 (three) times daily.  02/08/17   [provider]  aspirin EC 81 MG tablet Take 81 mg by mouth daily.    [provider]  atorvastatin (LIPITOR) 20 MG tablet Take 20 mg by mouth daily.     [provider]  Biotin 5 MG CAPS Take 5 mg by mouth daily.     [provider]  celecoxib (CELEBREX) 200 MG capsule Take 200 mg by mouth daily.     [provider]  conjugated estrogens (PREMARIN) vaginal cream Place 1 Applicatorful vaginally daily. Use pea sized amount M-W-Fr before bedtime 11/29/18   Vanna ScotlandBrandon, Ashley, MD  cyclobenzaprine (FLEXERIL) 10 MG tablet Take 1 tablet (10 mg total) by mouth 3 (three) times daily as needed for muscle spasms. 01/27/16   Enid BaasKalisetti, Radhika, MD  diphenhydrAMINE (DIPHEN) 25 MG tablet Take 50 mg by mouth daily. Sometimes takes up to twice daily.    [provider]  diphenoxylate-atropine (LOMOTIL) 2.5-0.025 MG tablet Take 1 tablet by mouth every 4 (four) hours as needed for diarrhea or loose stools.     [provider]  ferrous sulfate 325 (65 FE) MG tablet Take 325 mg by mouth daily with breakfast.    [provider]  fesoterodine (TOVIAZ) 8 MG TB24 tablet Take 8 mg by mouth daily.    [provider]  gentamicin cream (GARAMYCIN) 0.1 % Apply 1 application topically daily. To wound on right toe    [provider]  insulin aspart (NOVOLOG) 100 UNIT/ML injection Inject 3-15 Units into the skin 3 (three) times daily with meals as needed for high blood sugar. Pt uses as needed per sliding scale:    Less than 140:  0 units  140-180:  3 units 181-220:  4 units 221- 260:  6 units 261- 320:  8  units 321-360:  10 units 361-400:  12 units Greater than 400:  15 units    [provider]  Insulin Glargine (TOUJEO SOLOSTAR) 300 UNIT/ML SOPN Inject 15 Units into the skin daily. Patient taking differently: Inject 15 Units into the skin daily. Patinet reports taking 50 units/day of Toujo if glucose > 130 and if glucose > 140 she takes Novolog SSI in addtiion to the Toujeo  - 09/30/18.  Checks glucose twice daily. 09/13/18   Altamese DillingVachhani, Vaibhavkumar, MD  isosorbide mononitrate (IMDUR) 30 MG 24 hr tablet Take 30 mg by mouth daily.     [provider]  lisinopril (PRINIVIL,ZESTRIL) 40 MG tablet Take 1 tablet (40 mg total) by mouth daily. 09/13/18   Altamese DillingVachhani, Vaibhavkumar, MD  metoprolol tartrate (LOPRESSOR) 25 MG tablet Take 25 mg by mouth 2 (two) times daily. 01/28/17   [provider]  mirtazapine (REMERON) 15 MG tablet Take 15 mg by mouth at bedtime.    [provider]  morphine (MS CONTIN) 15 MG 12 hr tablet Take 1 tablet (15 mg total) by mouth every 12 (twelve) hours. 09/13/18   Altamese DillingVachhani, Vaibhavkumar, MD  morphine (MSIR) 15 MG tablet Take 15 mg by mouth every 8 (eight) hours.    [provider]  Multiple Vitamin (MULTIVITAMIN WITH MINERALS) TABS tablet Take 1 tablet by mouth daily.    [provider]  nitrofurantoin (MACRODANTIN) 100 MG capsule Take 100 mg by mouth daily.    [provider]  nitroGLYCERIN (NITROSTAT) 0.4 MG SL tablet Place 0.4 mg under the tongue every 5 (five) minutes as needed for chest pain. Reported on 04/16/2016    [provider]  omeprazole (PRILOSEC) 20 MG capsule Take 20 mg by mouth daily.    [provider]  polyethylene glycol powder (GLYCOLAX/MIRALAX) powder 17 grams as needed each time for constpation 06/24/17   Wyline MoodAnna, Kiran, MD  pregabalin (LYRICA) 150 MG capsule Take 1 capsule (150 mg total) by mouth 2 (two) times daily. 01/19/17   Milagros LollSudini, Srikar, MD  traZODone (DESYREL) 100 MG tablet Take 100  mg by mouth at bedtime. 01/28/17   [provider]  venlafaxine (EFFEXOR) 75 MG tablet Take 75 mg by mouth 3 (three) times daily with meals.    [provider]  vitamin B-12 (CYANOCOBALAMIN) 1000 MCG tablet Take 5,000 mcg by mouth daily.     [provider]  Vitamin D, Ergocalciferol, (DRISDOL) 50000 units CAPS capsule Take 50,000 Units by mouth every Sunday.     [provider]    Allergies Bactrim [sulfamethoxazole-trimethoprim]; Codeine; Flu virus vaccine; Influenza vaccines; Methadone; Oxycodone-acetaminophen; Percocet [oxycodone-acetaminophen]; Sulfa antibiotics; Tetanus antitoxin; Tetanus toxoids; Penicillin g; and Tetracyclines & related  Family History  Problem Relation Age of Onset  . CAD Mother   . CAD Father     Social History Social History   Tobacco Use  . Smoking status: Never Smoker  . Smokeless tobacco: Never Used  Substance Use Topics  . Alcohol use: No  . Drug use: No    Review of Systems Constitutional: No fever/chills Eyes: No visual changes. ENT: No sore throat. Cardiovascular: Denies chest pain. Respiratory: Denies shortness of breath. Gastrointestinal: No abdominal pain.  No nausea, no vomiting.  No diarrhea.   Genitourinary: Negative for dysuria. Musculoskeletal: Positive for right second digit pain Skin: Negative for rash. Neurological: Negative for headaches, focal weakness or numbness.  ____________________________________________   PHYSICAL EXAM:  VITAL SIGNS: ED Triage Vitals [12/19/18 1635]  Enc Vitals Group     BP 106/72     Pulse Rate 93     Resp 18     Temp 98.3 F (36.8 C)     Temp Source Oral     SpO2 95 %     Weight 186 lb (84.4 kg)     Height 5\' 7"  (1.702 m)     Head Circumference      Peak Flow      Pain Score 8   Constitutional: Alert and oriented.  Eyes: Conjunctivae are normal.  ENT      Head: Normocephalic and atraumatic.      Nose: No congestion/rhinnorhea.      Mouth/Throat:  Mucous membranes are moist.      Neck: No stridor. Hematological/Lymphatic/Immunilogical: No cervical lymphadenopathy. Cardiovascular: Normal rate, regular rhythm.  No murmurs, rubs, or gallops.  Respiratory: Normal respiratory effort without tachypnea nor retractions. Breath sounds are clear and equal bilaterally. No wheezes/rales/rhonchi. Gastrointestinal: Soft and non tender. No  rebound. No guarding.  Genitourinary: Deferred Musculoskeletal: Right foot status post amputation of the first digit.  Redness and swelling noted to the second digit.  Ulceration noticed of the dorsal aspect.  Some redness and erythema to the dorsum of the foot Neurologic:  Normal speech and language. No gross focal neurologic deficits are appreciated.  Skin:  Skin is warm, dry and intact. No rash noted. Psychiatric: Mood and affect are normal. Speech and behavior are normal. Patient exhibits appropriate insight and judgment.  ____________________________________________    LABS (pertinent positives/negatives)  CBC wbc 10.9, hgb 12.4, plt 394 CMP wnl except glu 235 Lactic 2.2  ____________________________________________   EKG  None  ____________________________________________    RADIOLOGY  Right foot No acute osseous pathology  ____________________________________________   PROCEDURES  Procedures  ____________________________________________   INITIAL IMPRESSION / ASSESSMENT AND PLAN / ED COURSE  Pertinent labs & imaging results that were available during my care of the patient were reviewed by me and considered in my medical decision making (see chart for details).   Patient presented to the emergency department today because of concerns for infection to her right second toe.  Patient does have a history of diabetes and is required multiple toe amputations in the past.  On exam it does appear grossly infected.  Patient's x-ray did not show any findings concerning for osteomyelitis.   Patient's lactate was minimally elevated at 2.2.  Will plan on admission for IV antibiotics.  Discussed findings plan with patient.   ____________________________________________   FINAL CLINICAL IMPRESSION(S) / ED DIAGNOSES  Final diagnoses:  Right foot infection     Note: This dictation was prepared with Dragon dictation. Any transcriptional errors that result from this process are unintentional     Phineas Semen, MD 12/19/18 2116

## 2018-12-19 NOTE — Progress Notes (Signed)
Advanced care plan.  Purpose of the Encounter: CODE STATUS  Parties in Attendance: Patient herself  Patient's Decision Capacity: Intact Subjective/Patient's story:  Jessica Donovan  is a 72 y.o. female with a known history of chronic back pain, coronary artery disease, diabetes type 2, hypertension, history of osteomyelitis, previous stroke who is presenting with right second toe with swelling and redness  Objective/Medical story Discussed with the patient regarding her desires for cardiac and pulmonary resuscitation   Goals of care determination:  Patient states that she would like to be a DNR and would not want to be resuscitated   CODE STATUS: DNR   Time spent discussing advanced care planning: 16 minutes

## 2018-12-19 NOTE — H&P (Signed)
[facility] - [place] at [facility]   PATIENT NAME: Jessica Donovan    MR#:  [id number]  DATE OF BIRTH:  12/04/1946  DATE OF ADMISSION:  12/19/2018  PRIMARY CARE PHYSICIAN: Garlon Hatchet, MD   REQUESTING/REFERRING PHYSICIAN: Phineas Semen, MD  CHIEF COMPLAINT:   Chief Complaint  Patient presents with  . Wound Infection    HISTORY OF PRESENT ILLNESS: Jessica Donovan  is a 72 y.o. female with a known history of chronic back pain, coronary artery disease, diabetes type 2, hypertension, history of osteomyelitis, previous stroke who is presenting with right second toe with swelling and redness.  Patient also has redness involving the foot.  Patient has previous osteomyelitis in her toes requiring amputation in the past.  Patient has had fevers and chills.   PAST MEDICAL HISTORY:   Past Medical History:  Diagnosis Date  . Anxiety   . Chronic back pain   . Collagen vascular disease (HCC)   . Coronary artery disease    a. s/p PCI/DES to dRCA & mLCx in 2014 with repeat LHC in 10/2015 showing patent stents  . Diabetes mellitus without complication (HCC)   . Hypertension   . Low back pain   . Myocardial infarction (HCC)   . Osteomyelitis of toe (HCC) 01/23/2016  . Stroke [facility])     PAST SURGICAL HISTORY:  Past Surgical History:  Procedure Laterality Date  . AMPUTATION TOE Left 11/10/2015   Procedure: AMPUTATION TOE;  Surgeon: Linus Galas, MD;  Location: [facility] ORS;  Service: Podiatry;  Laterality: Left;  . AMPUTATION TOE Left 01/24/2016   Procedure: AMPUTATION TOE (2nd mpj);  Surgeon: Linus Galas, DPM;  Location: [facility] ORS;  Service: Podiatry;  Laterality: Left;  . CARDIAC CATHETERIZATION N/A 11/12/2015   Procedure: Left Heart Cath and Coronary Angiography;  Surgeon: Marcina Millard, MD;  Location: [facility] INVASIVE CV LAB;  Service: Cardiovascular;  Laterality: N/A;  . COLONOSCOPY WITH PROPOFOL N/A 07/20/2017   Procedure: COLONOSCOPY WITH PROPOFOL;  Surgeon: Wyline Mood,  MD;  Location: [facility] ENDOSCOPY;  Service: Endoscopy;  Laterality: N/A;  . JOINT REPLACEMENT    . TOE AMPUTATION      SOCIAL HISTORY:  Social History   Tobacco Use  . Smoking status: Never Smoker  . Smokeless tobacco: Never Used  Substance Use Topics  . Alcohol use: No    FAMILY HISTORY:  Family History  Problem Relation Age of Onset  . CAD Mother   . CAD Father     DRUG ALLERGIES:  Allergies  Allergen Reactions  . Bactrim [Sulfamethoxazole-Trimethoprim]   . Codeine Nausea And Vomiting  . Flu Virus Vaccine     Other reaction(s): Other (See Comments) Passed out for 12 days  . Influenza Vaccines Other (See Comments)    Reaction:  Caused pt to pass out   . Methadone Hives and Itching  . Oxycodone-Acetaminophen Nausea And Vomiting  . Percocet [Oxycodone-Acetaminophen] Nausea And Vomiting  . Sulfa Antibiotics   . Tetanus Antitoxin   . Tetanus Toxoids Swelling and Other (See Comments)    Reaction:  Swelling at injection site  . Penicillin G Rash    Has patient had a PCN reaction causing immediate rash, facial/tongue/throat swelling, SOB or lightheadedness with hypotension: Yes Has patient had a PCN reaction causing severe rash involving mucus membranes or skin necrosis: No Has patient had a PCN reaction that required hospitalization: No Has patient had a PCN reaction occurring within the last 10 years: No If all of the above answers are "  NO", then may proceed with Cephalosporin use..  . Tetracyclines & Related Rash    REVIEW OF SYSTEMS:   CONSTITUTIONAL: No fever, fatigue or weakness.  EYES: No blurred or double vision.  EARS, NOSE, AND THROAT: No tinnitus or ear pain.  RESPIRATORY: No cough, shortness of breath, wheezing or hemoptysis.  CARDIOVASCULAR: No chest pain, orthopnea, edema.  GASTROINTESTINAL: No nausea, vomiting, diarrhea or abdominal pain.  GENITOURINARY: No dysuria, hematuria.  ENDOCRINE: No polyuria, nocturia,  HEMATOLOGY: No anemia, easy bruising or  bleeding SKIN: Right foot redness MUSCULOSKELETAL: No joint pain or arthritis.   NEUROLOGIC: No tingling, numbness, weakness.  PSYCHIATRY: No anxiety or depression.   MEDICATIONS AT HOME:  Prior to Admission medications   Medication Sig Start Date End Date Taking? Authorizing Provider  acetaminophen (TYLENOL) 500 MG tablet Take 1,000 mg by mouth every 8 (eight) hours as needed for mild pain or headache.     [provider]  ALPRAZolam Prudy Feeler) 1 MG tablet Take 1 mg by mouth 3 (three) times daily.  02/08/17   [provider]  aspirin EC 81 MG tablet Take 81 mg by mouth daily.    [provider]  atorvastatin (LIPITOR) 20 MG tablet Take 20 mg by mouth daily.     [provider]  Biotin 5 MG CAPS Take 5 mg by mouth daily.     [provider]  celecoxib (CELEBREX) 200 MG capsule Take 200 mg by mouth daily.     [provider]  conjugated estrogens (PREMARIN) vaginal cream Place 1 Applicatorful vaginally daily. Use pea sized amount M-W-Fr before bedtime 11/29/18   Vanna Scotland, MD  cyclobenzaprine (FLEXERIL) 10 MG tablet Take 1 tablet (10 mg total) by mouth 3 (three) times daily as needed for muscle spasms. 01/27/16   Enid Baas, MD  diphenhydrAMINE (DIPHEN) 25 MG tablet Take 50 mg by mouth daily. Sometimes takes up to twice daily.    [provider]  diphenoxylate-atropine (LOMOTIL) 2.5-0.025 MG tablet Take 1 tablet by mouth every 4 (four) hours as needed for diarrhea or loose stools.     [provider]  ferrous sulfate 325 (65 FE) MG tablet Take 325 mg by mouth daily with breakfast.    [provider]  fesoterodine (TOVIAZ) 8 MG TB24 tablet Take 8 mg by mouth daily.    [provider]  gentamicin cream (GARAMYCIN) 0.1 % Apply 1 application topically daily. To wound on right toe    [provider]  insulin aspart (NOVOLOG) 100 UNIT/ML injection Inject 3-15 Units into the skin 3 (three) times  daily with meals as needed for high blood sugar. Pt uses as needed per sliding scale:    Less than 140:  0 units  140-180:  3 units 181-220:  4 units 221- 260:  6 units 261- 320:  8 units 321-360:  10 units 361-400:  12 units Greater than 400:  15 units    [provider]  Insulin Glargine (TOUJEO SOLOSTAR) 300 UNIT/ML SOPN Inject 15 Units into the skin daily. Patient taking differently: Inject 15 Units into the skin daily. Patinet reports taking 50 units/day of Toujo if glucose > 130 and if glucose > 140 she takes Novolog SSI in addtiion to the Toujeo  - 09/30/18.  Checks glucose twice daily. 09/13/18   Altamese Dilling, MD  isosorbide mononitrate (IMDUR) 30 MG 24 hr tablet Take 30 mg by mouth daily.     [provider]  lisinopril (PRINIVIL,ZESTRIL) 40 MG tablet  Take 1 tablet (40 mg total) by mouth daily. 09/13/18   Altamese DillingVachhani, Vaibhavkumar, MD  metoprolol tartrate (LOPRESSOR) 25 MG tablet Take 25 mg by mouth 2 (two) times daily. 01/28/17   [provider]  mirtazapine (REMERON) 15 MG tablet Take 15 mg by mouth at bedtime.    [provider]  morphine (MS CONTIN) 15 MG 12 hr tablet Take 1 tablet (15 mg total) by mouth every 12 (twelve) hours. 09/13/18   Altamese DillingVachhani, Vaibhavkumar, MD  morphine (MSIR) 15 MG tablet Take 15 mg by mouth every 8 (eight) hours.    [provider]  Multiple Vitamin (MULTIVITAMIN WITH MINERALS) TABS tablet Take 1 tablet by mouth daily.    [provider]  nitrofurantoin (MACRODANTIN) 100 MG capsule Take 100 mg by mouth daily.    [provider]  nitroGLYCERIN (NITROSTAT) 0.4 MG SL tablet Place 0.4 mg under the tongue every 5 (five) minutes as needed for chest pain. Reported on 04/16/2016    [provider]  omeprazole (PRILOSEC) 20 MG capsule Take 20 mg by mouth daily.    [provider]  polyethylene glycol powder (GLYCOLAX/MIRALAX) powder 17 grams as needed each time for constpation 06/24/17    Wyline MoodAnna, Kiran, MD  pregabalin (LYRICA) 150 MG capsule Take 1 capsule (150 mg total) by mouth 2 (two) times daily. 01/19/17   Milagros LollSudini, Srikar, MD  traZODone (DESYREL) 100 MG tablet Take 100 mg by mouth at bedtime. 01/28/17   [provider]  venlafaxine (EFFEXOR) 75 MG tablet Take 75 mg by mouth 3 (three) times daily with meals.    [provider]  vitamin B-12 (CYANOCOBALAMIN) 1000 MCG tablet Take 5,000 mcg by mouth daily.     [provider]  Vitamin D, Ergocalciferol, (DRISDOL) 50000 units CAPS capsule Take 50,000 Units by mouth every Sunday.     [provider]      PHYSICAL EXAMINATION:   VITAL SIGNS: Blood pressure 116/66, pulse 83, temperature 98.3 F (36.8 C), temperature source Oral, resp. rate 18, height 5\' 7"  (1.702 m), weight 84.4 kg, SpO2 90 %.  GENERAL:  72 y.o.-year-old patient lying in the bed with no acute distress.  EYES: Pupils equal, round, reactive to light and accommodation. No scleral icterus. Extraocular muscles intact.  HEENT: Head atraumatic, normocephalic. Oropharynx and nasopharynx clear.  NECK:  Supple, no jugular venous distention. No thyroid enlargement, no tenderness.  LUNGS: Normal breath sounds bilaterally, no wheezing, rales,rhonchi or crepitation. No use of accessory muscles of respiration.  CARDIOVASCULAR: S1, S2 normal. No murmurs, rubs, or gallops.  ABDOMEN: Soft, nontender, nondistended. Bowel sounds present. No organomegaly or mass.  EXTREMITIES: No pedal edema, cyanosis, or clubbing.  NEUROLOGIC: Cranial nerves II through XII are intact. Muscle strength 5/5 in all extremities. Sensation intact. Gait not checked.  PSYCHIATRIC: The patient is alert and oriented x 3.  SKIN:           LABORATORY PANEL:   CBC Recent Labs  Lab 12/19/18 1638  WBC 10.9*  HGB 12.4  HCT 38.7  PLT 394  MCV 100.0  MCH 32.0  MCHC 32.0  RDW 12.4  LYMPHSABS 1.6  MONOABS 1.0  EOSABS 0.1  BASOSABS 0.0    ------------------------------------------------------------------------------------------------------------------  Chemistries  Recent Labs  Lab 12/19/18 1638  NA 135  K 4.0  CL 99  CO2 24  GLUCOSE 235*  BUN 19  CREATININE 0.73  CALCIUM 9.4  AST 28  ALT 16  ALKPHOS 78  BILITOT 0.3   ------------------------------------------------------------------------------------------------------------------  estimated creatinine clearance is 70.9 mL/min (by C-G formula based on SCr of 0.73 mg/dL). ------------------------------------------------------------------------------------------------------------------ No results for input(s): TSH, T4TOTAL, T3FREE, THYROIDAB in the last 72 hours.  Invalid input(s): FREET3   Coagulation profile No results for input(s): INR, PROTIME in the last 168 hours. ------------------------------------------------------------------------------------------------------------------- No results for input(s): DDIMER in the last 72 hours. -------------------------------------------------------------------------------------------------------------------  Cardiac Enzymes No results for input(s): CKMB, TROPONINI, MYOGLOBIN in the last 168 hours.  Invalid input(s): CK ------------------------------------------------------------------------------------------------------------------ Invalid input(s): POCBNP  ---------------------------------------------------------------------------------------------------------------  Urinalysis    Component Value Date/Time   COLORURINE YELLOW (A) 09/10/2018 1331   APPEARANCEUR Cloudy (A) 11/29/2018 1518   LABSPEC 1.019 09/10/2018 1331   LABSPEC 1.014 12/15/2014 0958   PHURINE 5.0 09/10/2018 1331   GLUCOSEU 1+ (A) 11/29/2018 1518   GLUCOSEU Negative 12/15/2014 0958   HGBUR NEGATIVE 09/10/2018 1331   BILIRUBINUR Negative 11/29/2018 1518   BILIRUBINUR Negative 12/15/2014 0958   KETONESUR NEGATIVE 09/10/2018 1331    PROTEINUR Negative 11/29/2018 1518   PROTEINUR 30 (A) 09/10/2018 1331   NITRITE Negative 11/29/2018 1518   NITRITE NEGATIVE 09/10/2018 1331   LEUKOCYTESUR Trace (A) 11/29/2018 1518   LEUKOCYTESUR 1+ 12/15/2014 0958     RADIOLOGY: Dg Foot Complete Right  Result Date: 12/19/2018 CLINICAL DATA:  Total infection EXAM: RIGHT FOOT COMPLETE - 3+ VIEW COMPARISON:  06/22/2017 FINDINGS: No acute displaced fracture or malalignment. Chronic fracture deformity distal fifth metatarsal. Status post amputation first digit at the MTP joint. No periostitis or bone destruction. No soft tissue emphysema. IMPRESSION: No acute osseous abnormality. Electronically Signed   By: Jasmine PangKim  Fujinaga M.D.   On: 12/19/2018 19:51    EKG: Orders placed or performed during the hospital encounter of 09/10/18  . ED EKG  . ED EKG  . EKG    IMPRESSION AND PLAN: Patient is 72 year old with diabetes presenting with right foot swelling and redness  1.  Right foot infection  We will treat with IV vancomycin and cefepime due to multiple allergies I will ask podiatry to see MRI of the foot will be ordered  2.  Diabetes type 2 we will place patient on Lantus  3.  Hyperlipidemia continue Lipitor  4.  Hypertension continue lisinopril and metoprolol  5.  Chronic pain continue morphine  6.  Some of the floor GERD continue PPIs  7.  Depression continue Effexor  All the records are reviewed and case discussed with ED provider. Management plans discussed with the patient, family and they are in agreement.  CODE STATUS: Code Status History    Date Active Date Inactive Code Status Order ID Comments User Context   09/11/2018 1748 09/13/2018 1748 DNR [id number]  Altamese DillingVachhani, Vaibhavkumar, MD Inpatient   09/10/2018 1635 09/11/2018 1748 Full Code [id number]  Ramonita LabGouru, Aruna, MD Inpatient   09/10/2018 1620 09/10/2018 1635 Full Code [id number]  Ramonita LabGouru, Aruna, MD ED   07/07/2018 0220 07/07/2018 1910 DNR [id number]  Cammy CopaMaier, Angela, MD Inpatient    06/10/2017 1446 06/10/2017 2022 DNR [id number]  Delfino LovettShah, Vipul, MD Inpatient   06/09/2017 1743 06/10/2017 1446 Full Code [id number]  Enid BaasKalisetti, Radhika, MD Inpatient   05/19/2017 2144 05/23/2017 1719 DNR [id number]  Oralia ManisWillis, David, MD Inpatient   02/23/2017 1640 03/01/2017 1902 DNR [id number]  Altamese DillingVachhani, Vaibhavkumar, MD Inpatient   01/17/2017 0407 01/19/2017 2010 DNR [id number]  Arnaldo Nataliamond, Michael S, MD Inpatient   04/09/2016 0311 04/10/2016 2044 DNR [id number]  Arnaldo Nataliamond, Michael S, MD Inpatient   04/09/2016 0205 04/09/2016 0311 Full Code [id number]  Oralia ManisWillis, David, MD Inpatient   04/03/2016  1853 04/04/2016 1817 Full Code [id number]  Houston Siren, MD Inpatient   02/23/2016 2252 02/25/2016 1637 DNR [id number]  Robley Fries, MD Inpatient   01/24/2016 1554 01/27/2016 1906 DNR [id number]  Milagros Loll, MD Inpatient   01/23/2016 1607 01/23/2016 1622 Full Code [id number]  Shaune Pollack, MD Inpatient   12/07/2015 2223 12/09/2015 1858 Full Code [id number]  Auburn Bilberry, MD Inpatient   11/29/2015 1741 12/01/2015 1833 DNR [id number]  Shaune Pollack, MD Inpatient   11/10/2015 0954 11/12/2015 2139 DNR [id number]  Linus Galas, MD Inpatient   11/07/2015 1732 11/10/2015 0954 DNR [id number]  Altamese Dilling, MD Inpatient   11/07/2015 1549 11/07/2015 1732 Full Code [id number]  Altamese Dilling, MD Inpatient    Questions for Most Recent Historical Code Status (Order [id number])    Question Answer Comment   In the event of cardiac or respiratory ARREST Do not call a "code blue"    In the event of cardiac or respiratory ARREST Do not perform Intubation, CPR, defibrillation or ACLS    In the event of cardiac or respiratory ARREST Use medication by any route, position, wound care, and other measures to relive pain and suffering. May use oxygen, suction and manual treatment of airway obstruction as needed for comfort.        TOTAL TIME TAKING CARE OF THIS PATIENT:52minutes.    Auburn Bilberry M.D on 12/19/2018 at 8:42 PM  Between 7am to 6pm -  Pager - [phone]  After 6pm go to [email or web address] - password [email or web address]  [employer] Office  [phone]  CC: Primary care physician; Garlon Hatchet, MD

## 2018-12-19 NOTE — Progress Notes (Signed)
Pharmacy Antibiotic Note  Jessica Donovan is a 72 y.o. female admitted on 12/19/2018 with a diabetic foot infection.  Pharmacy has been consulted for Cefepime and vancomycin dosing. Patient has a history of osteomyelitis requiring toe amputation in the past. Vancomycin 1g IV ordered in ED.   Plan: Vancomycin 1000 IV every 12 hours with 6 hour stack dosing.  Goal trough 15-20 mcg/mL. Calculated trough @ Css is 17. Trough level prior to 4th dose.   Start cefepime 2g every 8 hours  Height: 5\' 7"  (170.2 cm) Weight: 186 lb (84.4 kg) IBW/kg (Calculated) : 61.6  Temp (24hrs), Avg:98.3 F (36.8 C), Min:98.3 F (36.8 C), Max:98.3 F (36.8 C)  Recent Labs  Lab 12/19/18 1637 12/19/18 1638 12/19/18 1918  WBC  --  10.9*  --   CREATININE  --  0.73  --   LATICACIDVEN 2.2*  --  1.8    Estimated Creatinine Clearance: 70.9 mL/min (by C-G formula based on SCr of 0.73 mg/dL).    Allergies  Allergen Reactions  . Bactrim [Sulfamethoxazole-Trimethoprim]   . Codeine Nausea And Vomiting  . Flu Virus Vaccine     Other reaction(s): Other (See Comments) Passed out for 12 days  . Influenza Vaccines Other (See Comments)    Reaction:  Caused pt to pass out   . Methadone Hives and Itching  . Oxycodone-Acetaminophen Nausea And Vomiting  . Percocet [Oxycodone-Acetaminophen] Nausea And Vomiting  . Sulfa Antibiotics   . Tetanus Antitoxin   . Tetanus Toxoids Swelling and Other (See Comments)    Reaction:  Swelling at injection site  . Penicillin G Rash    Has patient had a PCN reaction causing immediate rash, facial/tongue/throat swelling, SOB or lightheadedness with hypotension: Yes Has patient had a PCN reaction causing severe rash involving mucus membranes or skin necrosis: No Has patient had a PCN reaction that required hospitalization: No Has patient had a PCN reaction occurring within the last 10 years: No If all of the above answers are "NO", then may proceed with Cephalosporin use..  .  Tetracyclines & Related Rash    Antimicrobials this admission: 12/30 Vancomycin  >>  12/30 cefepime >>    Thank you for allowing pharmacy to be a part of this patient's care.  Gardner CandleSheema M Koby Hartfield, PharmD, BCPS Clinical Pharmacist 12/19/2018 9:36 PM

## 2018-12-20 ENCOUNTER — Encounter: Payer: Self-pay | Admitting: Anesthesiology

## 2018-12-20 LAB — BASIC METABOLIC PANEL
Anion gap: 7 (ref 5–15)
BUN: 15 mg/dL (ref 8–23)
CALCIUM: 9.1 mg/dL (ref 8.9–10.3)
CO2: 28 mmol/L (ref 22–32)
Chloride: 103 mmol/L (ref 98–111)
Creatinine, Ser: 0.53 mg/dL (ref 0.44–1.00)
GFR calc Af Amer: >60 mL/min (ref >60)
GFR calc non Af Amer: >60 mL/min (ref >60)
Glucose, Bld: 204 mg/dL — ABNORMAL HIGH (ref 70–99)
Potassium: 4.2 mmol/L (ref 3.5–5.1)
Sodium: 138 mmol/L (ref 135–145)

## 2018-12-20 LAB — CBC
HCT: 35.2 % — ABNORMAL LOW (ref 36.0–46.0)
Hemoglobin: 11.1 g/dL — ABNORMAL LOW (ref 12.0–15.0)
MCH: 31.7 pg (ref 26.0–34.0)
MCHC: 31.5 g/dL (ref 30.0–36.0)
MCV: 100.6 fL — ABNORMAL HIGH (ref 80.0–100.0)
Platelets: 334 10*3/uL (ref 150–400)
RBC: 3.5 MIL/uL — ABNORMAL LOW (ref 3.87–5.11)
RDW: 12.6 % (ref 11.5–15.5)
WBC: 7.9 10*3/uL (ref 4.0–10.5)
nRBC: 0 % (ref 0.0–0.2)

## 2018-12-20 LAB — GLUCOSE, CAPILLARY
Glucose-Capillary: 133 mg/dL — ABNORMAL HIGH (ref 70–99)
Glucose-Capillary: 162 mg/dL — ABNORMAL HIGH (ref 70–99)
Glucose-Capillary: 186 mg/dL — ABNORMAL HIGH (ref 70–99)
Glucose-Capillary: 187 mg/dL — ABNORMAL HIGH (ref 70–99)
Glucose-Capillary: 263 mg/dL — ABNORMAL HIGH (ref 70–99)
Glucose-Capillary: 265 mg/dL — ABNORMAL HIGH (ref 70–99)
Glucose-Capillary: 313 mg/dL — ABNORMAL HIGH (ref 70–99)

## 2018-12-20 LAB — HEMOGLOBIN A1C
HEMOGLOBIN A1C: 7.8 % — AB (ref 4.8–5.6)
Mean Plasma Glucose: 177.16 mg/dL

## 2018-12-20 MED ORDER — INSULIN ASPART 100 UNIT/ML ~~LOC~~ SOLN
5.0000 [IU] | Freq: Three times a day (TID) | SUBCUTANEOUS | Status: DC
  Administered 2018-12-21 – 2018-12-23 (×7): 5 [IU] via SUBCUTANEOUS
  Filled 2018-12-20 (×7): qty 1

## 2018-12-20 NOTE — Progress Notes (Signed)
[employer] [facility] [facility] [place] [facility]   PATIENT NAME: Jessica DanceBarbara Donovan    MR#:  [id number]  DATE OF BIRTH:  Jun 24, 1946  SUBJECTIVE:   no acute issues overnight   REVIEW OF SYSTEMS:    Review of Systems  Constitutional: Negative for fever, chills weight loss HENT: Negative for ear pain, nosebleeds, congestion, facial swelling, rhinorrhea, neck pain, neck stiffness and ear discharge.   Respiratory: Negative for cough, shortness of breath, wheezing  Cardiovascular: Negative for chest pain, palpitations and leg swelling.  Gastrointestinal: Negative for heartburn, abdominal pain, vomiting, diarrhea or consitpation Genitourinary: Negative for dysuria, urgency, frequency, hematuria Musculoskeletal: Negative for back pain or joint pain Neurological: Negative for dizziness, seizures, syncope, focal weakness,  numbness and headaches.  Hematological: Does not bruise/bleed easily.  Psychiatric/Behavioral: Negative for hallucinations, confusion, dysphoric mood  Right foot infection  Tolerating Diet: yes      DRUG ALLERGIES:   Allergies  Allergen Reactions  . Bactrim [Sulfamethoxazole-Trimethoprim]   . Codeine Nausea And Vomiting  . Flu Virus Vaccine     Other reaction(s): Other (See Comments) Passed out for 12 days  . Influenza Vaccines Other (See Comments)    Reaction:  Caused pt to pass out   . Methadone Hives and Itching  . Oxycodone-Acetaminophen Nausea And Vomiting  . Percocet [Oxycodone-Acetaminophen] Nausea And Vomiting  . Sulfa Antibiotics   . Tetanus Antitoxin   . Tetanus Toxoids Swelling and Other (See Comments)    Reaction:  Swelling at injection site  . Penicillin G Rash    Has patient had a PCN reaction causing immediate rash, facial/tongue/throat swelling, SOB or lightheadedness with hypotension: Yes Has patient had a PCN reaction causing severe rash involving mucus membranes or skin necrosis: No Has patient had a PCN reaction that required  hospitalization: No Has patient had a PCN reaction occurring within the last 10 years: No If all of the above answers are "NO", then may proceed with Cephalosporin use..  . Tetracyclines & Related Rash    VITALS:  Blood pressure 126/62, pulse 74, temperature 98 F (36.7 C), temperature source Oral, resp. rate 18, height 5\' 7"  (1.702 m), weight 82.2 kg, SpO2 97 %.  PHYSICAL EXAMINATION:  Constitutional: Appears well-developed and well-nourished. No distress. HENT: Normocephalic. [name]. Oropharynx is clear and moist.  Eyes: Conjunctivae and EOM are normal. PERRLA, no scleral icterus.  Neck: Normal ROM. Neck supple. No JVD. No tracheal deviation. CVS: RRR, S1/S2 +, no murmurs, no gallops, no carotid bruit.  Pulmonary: Effort and breath sounds normal, no stridor, rhonchi, wheezes, rales.  Abdominal: Soft. BS +,  no distension, tenderness, rebound or guarding.  Musculoskeletal: Normal range of motion. No edema and no tenderness.  Neuro: Alert. CN 2-12 grossly intact. No focal deficits. Skin: left foot 2 amputated toes Right foot wrapped  Psychiatric: Normal mood and affect.      LABORATORY PANEL:   CBC Recent Labs  Lab 12/20/18 0330  WBC 7.9  HGB 11.1*  HCT 35.2*  PLT 334   ------------------------------------------------------------------------------------------------------------------  Chemistries  Recent Labs  Lab 12/19/18 1638 12/20/18 0330  NA 135 138  K 4.0 4.2  CL 99 103  CO2 24 28  GLUCOSE 235* 204*  BUN 19 15  CREATININE 0.73 0.53  CALCIUM 9.4 9.1  AST 28  --   ALT 16  --   ALKPHOS 78  --   BILITOT 0.3  --    ------------------------------------------------------------------------------------------------------------------  Cardiac Enzymes No results for input(s): TROPONINI in the last 168  hours. ------------------------------------------------------------------------------------------------------------------  RADIOLOGY:  Mr Foot Right W Wo  Contrast  Result Date: 12/20/2018 CLINICAL DATA:  Right second toe swelling, pain and redness. Prior history of osteomyelitis with amputation of the great toe. EXAM: MRI OF THE RIGHT FOREFOOT WITHOUT AND WITH CONTRAST TECHNIQUE: Multiplanar, multisequence MR imaging of the right foot was performed before and after the administration of intravenous contrast. CONTRAST:  8.5 cc Gadavist COMPARISON:  Radiographs 12/19/2018 FINDINGS: Prior amputation of the right great toe. The first metatarsal is unremarkable. No findings for fracture or osteomyelitis. There is diffuse T1 and T2 signal abnormality and contrast enhancement in the proximal and middle phalanges of the second toe consistent with osteomyelitis. There is also associated diffuse surrounding subcutaneous soft tissue swelling/edema, fluid and enhancement. I do not see a discrete drainable soft tissue abscess. The other bony structures are intact. No other areas of osteomyelitis are identified. There is diffuse cellulitis and myofasciitis but no findings for pyomyositis. IMPRESSION: 1. MR findings consistent with osteomyelitis involving the proximal and middle phalanges of the second toe. 2. Diffuse and marked soft tissue swelling/edema and fluid involving the second toe consistent with severe cellulitis. No discrete drainable soft tissue abscess. 3. Diffuse forefoot cellulitis and myofasciitis but no findings for pyomyositis. Electronically Signed   By: Rudie MeyerP.  Gallerani M.D.   On: 12/20/2018 07:57   Dg Foot Complete Right  Result Date: 12/19/2018 CLINICAL DATA:  Total infection EXAM: RIGHT FOOT COMPLETE - 3+ VIEW COMPARISON:  06/22/2017 FINDINGS: No acute displaced fracture or malalignment. Chronic fracture deformity distal fifth metatarsal. Status post amputation first digit at the MTP joint. No periostitis or bone destruction. No soft tissue emphysema. IMPRESSION: No acute osseous abnormality. Electronically Signed   By: Jasmine PangKim  Fujinaga M.D.   On:  12/19/2018 19:51     ASSESSMENT AND PLAN:   72 year old female with history of CAD and diabetes who presents due to right second toe swelling and redness.  1. osteomyelitis involving the proximal and middle phalanges of the second toe: Continue broad-spectrum antibiotics with vancomycin and cefepime Plan to go to the OR today with possibility of amputation of second and third toe with debridement N.p.o. after midnight Appreciate podiatry consultation   2.  Diabetes: Continue sliding scale, Lantus and ADA diet  3.  Essential hypertension: Continue isosorbide, lisinopril, metoprolol  4.  Depression: Continue Effexor, Remeron, Xanax and trazodone  5.  Anemia of chronic disease: Continue ferrous sulfate  6.  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  Hyperlipidemia: Continue statin  8.  CAD: Continue aspirin, statin and metoprolol    Management plans discussed with the patient and she is in agreement.  CODE STATUS: DNR  TOTAL TIME TAKING CARE OF THIS PATIENT: 30 minutes.     POSSIBLE D/C 3 days, DEPENDING ON CLINICAL CONDITION.   Camia Dipinto M.D on 12/20/2018 at 10:09 AM  Between 7am to 6pm - Pager - [phone] After 6pm go to [email or web address] - password [email or web address]  [facility] [place] [facility]  Office  [phone]  CC: Primary care physician; Garlon HatchetMcConville, Robert, MD  Note: This dictation was prepared with Dragon dictation along with smaller phrase technology. Any transcriptional errors that result from this process are unintentional.

## 2018-12-20 NOTE — Progress Notes (Signed)
   12/20/18 1415  Clinical Encounter Type  Visited With Patient  Visit Type Initial  Referral From Other (Comment) (Licensed conveyancerunit secretary)  Recommendations return later today  Spiritual Encounters  Spiritual Needs Emotional   Chaplain followed up on page from unit secretary regarding patient support.  Chaplain utilized active and reflective listening as patient shared thoughts and feelings regarding family dynamics between her, her daughter, and her sister.  Chaplain paged away to code blue.  Chaplain apologized to patient as she needed to respond to the code and said that she would return later.

## 2018-12-20 NOTE — Consult Note (Signed)
[facility]                                                      Patient Demographics  Jessica DanceBarbara Donovan, is a 72 y.o. female   MRN: [id number]   DOB - [birth year]  Admit Date - 12/19/2018    Outpatient Primary MD for the patient is Garlon HatchetMcConville, Robert, MD  Consult requested in the Hospital by Adrian SaranMody, Sital, MD, On 12/20/2018    Reason for consult right foot infection   With History of -  Past Medical History:  Diagnosis Date  . Anxiety   . Chronic back pain   . Collagen vascular disease (HCC)   . Coronary artery disease    a. s/p PCI/DES to dRCA & mLCx in 2014 with repeat LHC in 10/2015 showing patent stents  . Diabetes mellitus without complication (HCC)   . Hypertension   . Low back pain   . Myocardial infarction (HCC)   . Osteomyelitis of toe (HCC) 01/23/2016  . Stroke [facility](HCC)       Past Surgical History:  Procedure Laterality Date  . AMPUTATION TOE Left 11/10/2015   Procedure: AMPUTATION TOE;  Surgeon: Linus Galasodd Cline, MD;  Location: [facility] ORS;  Service: Podiatry;  Laterality: Left;  . AMPUTATION TOE Left 01/24/2016   Procedure: AMPUTATION TOE (2nd mpj);  Surgeon: Linus Galasodd Cline, DPM;  Location: [facility] ORS;  Service: Podiatry;  Laterality: Left;  . CARDIAC CATHETERIZATION N/A 11/12/2015   Procedure: Left Heart Cath and Coronary Angiography;  Surgeon: Marcina MillardAlexander Paraschos, MD;  Location: [facility] INVASIVE CV LAB;  Service: Cardiovascular;  Laterality: N/A;  . COLONOSCOPY WITH PROPOFOL N/A 07/20/2017   Procedure: COLONOSCOPY WITH PROPOFOL;  Surgeon: Wyline MoodAnna, Kiran, MD;  [facility];  Service: Endoscopy;  Laterality: N/A;  . JOINT REPLACEMENT    . TOE AMPUTATION      in for   Chief Complaint  Patient presents with  . Wound Infection     HPI  Jessica DanceBarbara Donovan  is a 72 y.o. female, patient is a long-term diabetic she has had amputation second third toes  on the left foot and the right great toe in the past.  She is been seeing a Dr. Logan BoresEvans [facility] who told her that she was doing well the last time he saw her.  Apparently is been several months since she was in to see him.  She was admitted to the hospital with a severe swollen right second toe with drainage from the dorsal aspect of the toe and cellulitis to the digit and progressing to the dorsum of the foot.  MRI shows osteomyelitis to the middle and proximal phalanges at this point.  Third toe does not appear to be involved with any osteo-at this juncture.    Review of Systems    In addition to the HPI above,  No  Fever-chills, No Headache, No changes with Vision or hearing, No problems swallowing food or Liquids, No Chest pain, Cough or Shortness of Breath, No Abdominal pain, No Nausea or Vommitting, Bowel movements are regular, No Blood in stool or Urine, No dysuria, No new skin rashes or bruises, No new joints pains-aches,  No new weakness, tingling, numbness in any extremity, No recent weight gain or loss, No polyuria, polydypsia or polyphagia, No significant Mental Stressors.  A full 10 point Review  of Systems was done, except as stated above, all other Review of Systems were negative.   Social History Social History   Tobacco Use  . Smoking status: Never Smoker  . Smokeless tobacco: Never Used  Substance Use Topics  . Alcohol use: No    Family History Family History  Problem Relation Age of Onset  . CAD Mother   . CAD Father     Prior to Admission medications   Medication Sig Start Date End Date Taking? Authorizing Provider  mirtazapine (REMERON) 15 MG tablet Take 15 mg by mouth at bedtime.   Yes [provider]  traZODone (DESYREL) 100 MG tablet Take 100 mg by mouth at bedtime. 01/28/17  Yes [provider]  acetaminophen (TYLENOL) 500 MG tablet Take 1,000 mg by mouth every 8 (eight) hours as needed for mild pain or headache.      [provider]  ALPRAZolam Prudy Feeler) 1 MG tablet Take 1 mg by mouth 3 (three) times daily.  02/08/17   [provider]  aspirin EC 81 MG tablet Take 81 mg by mouth daily.    [provider]  atorvastatin (LIPITOR) 20 MG tablet Take 20 mg by mouth daily.     [provider]  Biotin 5 MG CAPS Take 5 mg by mouth daily.     [provider]  celecoxib (CELEBREX) 200 MG capsule Take 200 mg by mouth daily.     [provider]  conjugated estrogens (PREMARIN) vaginal cream Place 1 Applicatorful vaginally daily. Use pea sized amount M-W-Fr before bedtime 11/29/18   Vanna Scotland, MD  cyclobenzaprine (FLEXERIL) 10 MG tablet Take 1 tablet (10 mg total) by mouth 3 (three) times daily as needed for muscle spasms. 01/27/16   Enid Baas, MD  diphenhydrAMINE (DIPHEN) 25 MG tablet Take 50 mg by mouth daily. Sometimes takes up to twice daily.    [provider]  diphenoxylate-atropine (LOMOTIL) 2.5-0.025 MG tablet Take 1 tablet by mouth every 4 (four) hours as needed for diarrhea or loose stools.     [provider]  ferrous sulfate 325 (65 FE) MG tablet Take 325 mg by mouth daily with breakfast.    [provider]  fesoterodine (TOVIAZ) 8 MG TB24 tablet Take 8 mg by mouth daily.    [provider]  gentamicin cream (GARAMYCIN) 0.1 % Apply 1 application topically daily. To wound on right toe    [provider]  insulin aspart (NOVOLOG) 100 UNIT/ML injection Inject 3-15 Units into the skin 3 (three) times daily with meals as needed for high blood sugar. Pt uses as needed per sliding scale:    Less than 140:  0 units  140-180:  3 units 181-220:  4 units 221- 260:  6 units 261- 320:  8 units 321-360:  10 units 361-400:  12 units Greater than 400:  15 units    [provider]  Insulin Glargine (TOUJEO SOLOSTAR) 300 UNIT/ML SOPN Inject 15 Units into the skin daily. Patient taking differently: Inject 15  Units into the skin daily. Patinet reports taking 50 units/day of Toujo if glucose > 130 and if glucose > 140 she takes Novolog SSI in addtiion to the Toujeo  - 09/30/18.  Checks glucose twice daily. 09/13/18   Altamese Dilling, MD  isosorbide mononitrate (IMDUR) 30 MG 24 hr tablet Take 30 mg by mouth daily.     [provider]  lisinopril (PRINIVIL,ZESTRIL) 40 MG tablet Take 1 tablet (40 mg total) by  mouth daily. 09/13/18   Altamese Dilling, MD  metoprolol tartrate (LOPRESSOR) 25 MG tablet Take 25 mg by mouth 2 (two) times daily. 01/28/17   [provider]  morphine (MS CONTIN) 15 MG 12 hr tablet Take 1 tablet (15 mg total) by mouth every 12 (twelve) hours. 09/13/18   Altamese Dilling, MD  morphine (MSIR) 15 MG tablet Take 15 mg by mouth every 8 (eight) hours.    [provider]  Multiple Vitamin (MULTIVITAMIN WITH MINERALS) TABS tablet Take 1 tablet by mouth daily.    [provider]  nitrofurantoin (MACRODANTIN) 100 MG capsule Take 100 mg by mouth daily.    [provider]  nitroGLYCERIN (NITROSTAT) 0.4 MG SL tablet Place 0.4 mg under the tongue every 5 (five) minutes as needed for chest pain. Reported on 04/16/2016    [provider]  omeprazole (PRILOSEC) 20 MG capsule Take 20 mg by mouth daily.    [provider]  polyethylene glycol powder (GLYCOLAX/MIRALAX) powder 17 grams as needed each time for constpation 06/24/17   Wyline Mood, MD  pregabalin (LYRICA) 150 MG capsule Take 1 capsule (150 mg total) by mouth 2 (two) times daily. 01/19/17   Milagros Loll, MD  venlafaxine (EFFEXOR) 75 MG tablet Take 75 mg by mouth 3 (three) times daily with meals.    [provider]  vitamin B-12 (CYANOCOBALAMIN) 1000 MCG tablet Take 5,000 mcg by mouth daily.     [provider]  Vitamin D, Ergocalciferol, (DRISDOL) 50000 units CAPS capsule Take 50,000 Units by mouth every Sunday.     [provider]     Anti-infectives (From admission, onward)   Start     Dose/Rate Route Frequency Ordered Stop   12/20/18 0500  vancomycin (VANCOCIN) IVPB 1000 mg/200 mL premix     1,000 mg 200 mL/hr over 60 Minutes Intravenous Every 12 hours 12/19/18 2151     12/19/18 2200  ceFEPIme (MAXIPIME) 2 g in sodium chloride 0.9 % 100 mL IVPB     2 g 200 mL/hr over 30 Minutes Intravenous Every 8 hours 12/19/18 2115     12 /30/19 2045  vancomycin (VANCOCIN) IVPB 1000 mg/200 mL premix     1,000 mg 200 mL/hr over 60 Minutes Intravenous  Once 12/19/18 2037 12/20/18 0104      Scheduled Meds: . ALPRAZolam  1 mg Oral TID  . aspirin EC  81 mg Oral Daily  . atorvastatin  20 mg Oral Daily  . celecoxib  200 mg Oral Daily  . [START ON 12/21/2018] conjugated estrogens  1 Applicatorful Vaginal Q M,W,F-2000  . enoxaparin (LOVENOX) injection  40 mg Subcutaneous Q24H  . ferrous sulfate  325 mg Oral Q breakfast  . fesoterodine  8 mg Oral Daily  . insulin aspart  0-9 Units Subcutaneous TID AC & HS  . insulin glargine  15 Units Subcutaneous Daily  . isosorbide mononitrate  30 mg Oral Daily  . lisinopril  40 mg Oral Daily  . metoprolol tartrate  25 mg Oral BID  . mirtazapine  15 mg Oral QHS  . morphine  15 mg Oral Q12H  . morphine  15 mg Oral Q8H  . pantoprazole  40 mg Oral Daily  . pregabalin  150 mg Oral BID  . sodium chloride flush  3 mL Intravenous Q12H  . traZODone  100 mg Oral QHS  . venlafaxine  75 mg Oral TID WC  . vitamin B-12  5,000 mcg Oral Daily   Continuous Infusions: .  sodium chloride 250 mL (12/20/18 0003)  . ceFEPime (MAXIPIME) IV 2 g (12/20/18 0719)  . vancomycin 1,000 mg (12/20/18 0559)   PRN Meds:.sodium chloride, acetaminophen **OR** acetaminophen, acetaminophen, cyclobenzaprine, diphenoxylate-atropine, nitroGLYCERIN, ondansetron **OR** ondansetron (ZOFRAN) IV, sodium chloride flush  Allergies  Allergen Reactions  . Bactrim [Sulfamethoxazole-Trimethoprim]   . Codeine Nausea And Vomiting  .  Flu Virus Vaccine     Other reaction(s): Other (See Comments) Passed out for 12 days  . Influenza Vaccines Other (See Comments)    Reaction:  Caused pt to pass out   . Methadone Hives and Itching  . Oxycodone-Acetaminophen Nausea And Vomiting  . Percocet [Oxycodone-Acetaminophen] Nausea And Vomiting  . Sulfa Antibiotics   . Tetanus Antitoxin   . Tetanus Toxoids Swelling and Other (See Comments)    Reaction:  Swelling at injection site  . Penicillin G Rash    Has patient had a PCN reaction causing immediate rash, facial/tongue/throat swelling, SOB or lightheadedness with hypotension: Yes Has patient had a PCN reaction causing severe rash involving mucus membranes or skin necrosis: No Has patient had a PCN reaction that required hospitalization: No Has patient had a PCN reaction occurring within the last 10 years: No If all of the above answers are "NO", then may proceed with Cephalosporin use..  . Tetracyclines & Related Rash    Physical Exam  Vitals  Blood pressure (!) 149/87, pulse 92, temperature 98 F (36.7 C), resp. rate 18, height 5\' 7"  (1.702 m), weight 82.2 kg, SpO2 99 %.  Lower Extremity exam:  Vascular: Patient does have a palpable DP pulse coming into that foot.  PT pulse little more difficult to palpate on the right side but a definite palpable dorsalis pedis pulse  Dermatological: Patient has an open wound that is draining on top of the PIP joint of the second toe.  Noted cellulitis to the digit with extensive swelling some spread of the cellulitis proximally over the dorsum of the foot.  No open wounds on the third toe.  Neurological: Severe peripheral neuropathy associated diabetes  Ortho: Severe contracture of the second toe of the right foot likely had a shoe rubbing irritation on the digit and is what led to the open wound and subsequent infection to the region.  Toes significantly deformed likely in association with her previous amputation of the great toe on  that side.  Data Review  CBC Recent Labs  Lab 12/19/18 1638 12/20/18 0330  WBC 10.9* 7.9  HGB 12.4 11.1*  HCT 38.7 35.2*  PLT 394 334  MCV 100.0 100.6*  MCH 32.0 31.7  MCHC 32.0 31.5  RDW 12.4 12.6  LYMPHSABS 1.6  --   MONOABS 1.0  --   EOSABS 0.1  --   BASOSABS 0.0  --    ------------------------------------------------------------------------------------------------------------------  Chemistries  Recent Labs  Lab 12/19/18 1638 12/20/18 0330  NA 135 138  K 4.0 4.2  CL 99 103  CO2 24 28  GLUCOSE 235* 204*  BUN 19 15  CREATININE 0.73 0.53  CALCIUM 9.4 9.1  AST 28  --   ALT 16  --   ALKPHOS 78  --   BILITOT 0.3  --    ------------------------------------------------------------------------------------------------------------------ estimated creatinine clearance is 70 mL/min (by C-G formula based on SCr of 0.53 mg/dL). ------------------------------------------------------------------------------------------------------------------ No results for input(s): TSH, T4TOTAL, T3FREE, THYROIDAB in the last 72 hours.  Invalid input(s): FREET3 Urinalysis    Component Value Date/Time   COLORURINE AMBER (A) 12/19/2018 1918   APPEARANCEUR CLEAR (A)  12/19/2018 1918   APPEARANCEUR Cloudy (A) 11/29/2018 1518   LABSPEC 1.029 12/19/2018 1918   LABSPEC 1.014 12/15/2014 0958   PHURINE 5.0 12/19/2018 1918   GLUCOSEU 50 (A) 12/19/2018 1918   GLUCOSEU Negative 12/15/2014 0958   HGBUR NEGATIVE 12/19/2018 1918   BILIRUBINUR NEGATIVE 12/19/2018 1918   BILIRUBINUR Negative 11/29/2018 1518   BILIRUBINUR Negative 12/15/2014 0958   KETONESUR 5 (A) 12/19/2018 1918   PROTEINUR NEGATIVE 12/19/2018 1918   NITRITE NEGATIVE 12/19/2018 1918   LEUKOCYTESUR TRACE (A) 12/19/2018 1918   LEUKOCYTESUR Trace (A) 11/29/2018 1518   LEUKOCYTESUR 1+ 12/15/2014 0958     Imaging results:   Dg Foot Complete Right  Result Date: 12/19/2018 CLINICAL DATA:  Total infection EXAM: RIGHT FOOT  COMPLETE - 3+ VIEW COMPARISON:  06/22/2017 FINDINGS: No acute displaced fracture or malalignment. Chronic fracture deformity distal fifth metatarsal. Status post amputation first digit at the MTP joint. No periostitis or bone destruction. No soft tissue emphysema. IMPRESSION: No acute osseous abnormality. Electronically Signed   By: Jasmine PangKim  Fujinaga M.D.   On: 12/19/2018 19:51    Assessment & Plan: Patient on the MRI and x-rays appears that there is osteomyelitis in the digit.  I explained to her today that with the swelling and the deformity of the digit with likely loss circulation to the digit as well as the infection in the bone, we need to go and amputate second toe.  I left it as a possibility that we may have to amputate third toe and also debride soft tissue and even bone further up into the foot if it appears that there is infection in those areas.  Put orders in for consent to this effect of her try to get her set up for surgery tomorrow.  Keep her n.p.o. tonight.  Get cultures of the bone tomorrow.  Will also order some heel protector boots for both heels so she does not ulcerate those areas.  Active Problems:   Right foot infection   Family Communication: Plan discussed with patient   Recardo EvangelistMatthew Mackay Hanauer M.D on 12/20/2018 at 7:26 AM  Thank you for the consult, we will follow the patient with you in the Hospital.

## 2018-12-20 NOTE — Clinical Social Work Placement (Signed)
   CLINICAL SOCIAL WORK PLACEMENT  NOTE  Date:  12/20/2018  Patient Details  Name: Jessica DanceBarbara Hazelbaker MRN: [id number] Date of Birth: Sep 22, 1946  Clinical Social Work is seeking post-discharge placement for this patient at the Skilled  Nursing Facility level of care (*CSW will initial, date and re-position this form in  chart as items are completed):  Yes   Patient/family provided with [facility] Clinical Social Work Department's list of facilities offering this level of care within the geographic area requested by the patient (or if unable, by the patient's family).  Yes   Patient/family informed of their freedom to choose among providers that offer the needed level of care, that participate in Medicare, Medicaid or managed care program needed by the patient, have an available bed and are willing to accept the patient.  Yes   Patient/family informed of [facility]'s ownership interest in [facility] and [facility], as well as of the fact that they are under no obligation to receive care at these facilities.  PASRR submitted to EDS on       PASRR number received on       Existing PASRR number confirmed on 12/20/18     FL2 transmitted to all facilities in geographic area requested by pt/family on 12/20/18     FL2 transmitted to all facilities within larger geographic area on       Patient informed that his/her managed care company has contracts with or will negotiate with certain facilities, including the following:            Patient/family informed of bed offers received.  Patient chooses bed at       Physician recommends and patient chooses bed at      Patient to be transferred to   on  .  Patient to be transferred to facility by       Patient family notified on   of transfer.  Name of family member notified:        PHYSICIAN       Additional Comment:    _______________________________________________ Sianna Garofano, [name], LCSW 12/20/2018, 2:11 PM

## 2018-12-20 NOTE — Care Management Note (Signed)
Case Management Note  Patient Details  Name: Jessica DanceBarbara Donovan MRN: [id number] Date of Birth: 07-Oct-1946  Subjective/Objective:                  Patient states she is unsure of what will need to be amputated but does know several toes on the right foot will be. She lives alone but has a good friend named Jessica NixonJanice and one named Jessica Donovan that will take her to her appointments.  She is wanting to see if we can arrange someone to walk her dog.   Patient does have a rollator at home and may need a front wheeled rolling walker. She uses the Rollator only when going out of the home, does not use anything inside. Has applied for Medicaid 2 months ago and has not heard back from them, she shares that she struggles to pay for he medications however she has not been without them. Pharmacy is now with [facility].  PCP is DrMarland Kitchen.. [facility].  Does not want to change Doctors, She has a working Engineer, materialsGlucometer and checks her BS at home.She believes she has used [facility] in the past  Action/Plan:    Home health agency list provided per CMS.gov RNCM will follow for [facility] and potential need for a front wheeled walker [facility] DSS contact number provided [phone] to check on Medicaid Status [employer] [phone] to reach out to possibly help with walking her dog.   Expected Discharge Date:                  Expected Discharge Plan:     In-House Referral:  Clinical Social Work  Discharge planning Services  CM Consult  Post Acute Care Choice:  Home Health, Durable Medical Equipment Choice offered to:  Patient  DME Arranged:    DME Agency:     HH Arranged:    HH Agency:     Status of Service:  In process, will continue to follow  If discussed at Long Length of Stay Meetings, dates discussed:    Additional Comments:  Barrie DunkerDeliliah J Marlee Armenteros, RN 12/20/2018, 2:53 PM

## 2018-12-20 NOTE — Progress Notes (Signed)
Patients IV found to be infiltrated red and swollen.  IV D/c'd and R arm elevated.

## 2018-12-20 NOTE — [place] FL2 (Signed)
[place] MEDICAID FL2 LEVEL OF CARE SCREENING TOOL     IDENTIFICATION  Patient Name: Jessica Donovan Birthdate: July 29, 1946 Sex: female Admission Date (Current Location): 12/19/2018  [place]   Facility and Address:  [facility], [address], [place], [place] [address]      Provider Number: [id number]  Attending Physician Name and Address:  Adrian SaranMody, Sital, MD  Relative Name and Phone Number:       Current Level of Care: Hospital Recommended Level of Care: Skilled Nursing Facility Prior Approval Number:    Date Approved/Denied:   PASRR Number: ([id number])  Discharge Plan: SNF    Current Diagnoses: Patient Active Problem List   Diagnosis Date Noted  . Right foot infection 12/19/2018  . Acute encephalopathy 07/06/2018  . Anxiety 06/10/2017  . Chest pain with moderate risk for cardiac etiology 06/09/2017  . UTI (urinary tract infection) 05/19/2017  . CAD (coronary artery disease) 05/19/2017  . Chronic narcotic use 05/19/2017  . Cellulitis in diabetic foot (HCC) 05/19/2017  . Opioid overdose (HCC) 02/23/2017  . Altered mental status 02/23/2017  . Acute colitis 01/19/2017  . Sepsis (HCC) 01/17/2017  . Cellulitis of fourth toe of right foot 04/10/2016  . Somnolence 04/09/2016  . Dementia (HCC) 04/09/2016  . Cellulitis 04/03/2016  . Hematoma of right parietal scalp 02/23/2016  . Multiple falls 02/23/2016  . Physical debility 02/23/2016  . Type II diabetes mellitus, uncontrolled (HCC) 02/23/2016  . Syncope and collapse 02/23/2016  . Polypharmacy 02/23/2016  . Osteomyelitis of toe (HCC) 01/23/2016  . Rhabdomyolysis 12/07/2015  . ARF (acute renal failure) (HCC) 11/29/2015  . Hypotension 11/29/2015  . 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 Angina effort 11/07/2015  . Osteomyelitis (HCC) 11/07/2015  . Anemia due to chronic blood loss 06/14/2015  . Generalized  anxiety disorder 06/14/2015  . Lumbago with sciatica 06/14/2015  . 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  . 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 Abnormal weight loss 05/18/2014  . Chronic pain syndrome 05/18/2014  . 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 Diabetic polyneuropathy (HCC) 05/18/2014  . Type 2 diabetes mellitus with diabetic neuropathy (HCC) 05/07/2014  . Hemorrhage of rectum and anus 01/17/2014  . Cellulitis and abscess of foot excluding toe 05/18/2012    Orientation RESPIRATION BLADDER Height & Weight     Self, Time, Situation, Place  Normal Incontinent Weight: 181 lb 3.5 oz (82.2 kg) Height:  5\' 7"  (170.2 cm)  BEHAVIORAL SYMPTOMS/MOOD NEUROLOGICAL BOWEL NUTRITION STATUS      Continent Diet(Diet: Heart Healthy/ Carb Modified. )  AMBULATORY STATUS COMMUNICATION OF NEEDS Skin   Extensive Assist Verbally Surgical wounds                       Personal Care Assistance Level of Assistance  Bathing, Feeding, Dressing Bathing Assistance: Limited assistance Feeding assistance: Independent Dressing Assistance: Limited assistance     Functional Limitations Info  Sight, Hearing, Speech Sight Info: Adequate Hearing Info: Adequate Speech Info: Adequate    SPECIAL CARE FACTORS FREQUENCY  PT (By licensed PT), OT (By licensed OT)     PT Frequency: (5) OT Frequency: (5)            Contractures      Additional Factors Info  Code Status, Allergies Code Status Info: (DNR ) Allergies Info: (Bactrim Sulfamethoxazole-trimethoprim, Codeine, Flu Virus Vaccine, Influenza  Vaccines, Methadone, Oxycodone-acetaminophen, Percocet Oxycodone-acetaminophen, Sulfa Antibiotics, Tetanus Antitoxin, Tetanus Toxoids, Penicillin G, Tetracyclines & Related)           Current Medications (12/20/2018):  This is the current hospital active medication list Current Facility-Administered  Medications  Medication Dose Route Frequency Provider Last Rate Last Dose  . 0.9 %  sodium chloride infusion  250 mL Intravenous PRN Auburn Bilberry, MD 10 mL/hr at 12/20/18 0003 250 mL at 12/20/18 0003  . acetaminophen (TYLENOL) tablet 650 mg  650 mg Oral Q6H PRN Auburn Bilberry, MD       Or  . acetaminophen (TYLENOL) suppository 650 mg  650 mg Rectal Q6H PRN Auburn Bilberry, MD      . acetaminophen (TYLENOL) tablet 1,000 mg  1,000 mg Oral Q8H PRN Auburn Bilberry, MD      . ALPRAZolam Prudy Feeler) tablet 1 mg  1 mg Oral TID Auburn Bilberry, MD   1 mg at 12/20/18 8657  . aspirin EC tablet 81 mg  81 mg Oral Daily Auburn Bilberry, MD   81 mg at 12/20/18 8469  . atorvastatin (LIPITOR) tablet 20 mg  20 mg Oral Daily Auburn Bilberry, MD   20 mg at 12/19/18 2353  . ceFEPIme (MAXIPIME) 2 g in sodium chloride 0.9 % 100 mL IVPB  2 g Intravenous Q8H Hallaji, Sheema M, RPH 200 mL/hr at 12/20/18 0719 2 g at 12/20/18 0719  . celecoxib (CELEBREX) capsule 200 mg  200 mg Oral Daily Auburn Bilberry, MD   200 mg at 12/20/18 0939  . [START ON 12/21/2018] conjugated estrogens (PREMARIN) vaginal cream 1 Applicatorful  1 Applicatorful Vaginal Q M,W,F-2000 Auburn Bilberry, MD      . cyclobenzaprine (FLEXERIL) tablet 10 mg  10 mg Oral TID PRN Auburn Bilberry, MD   10 mg at 12/20/18 0010  . diphenoxylate-atropine (LOMOTIL) 2.5-0.025 MG per tablet 1 tablet  1 tablet Oral Q4H PRN Auburn Bilberry, MD      . enoxaparin (LOVENOX) injection 40 mg  40 mg Subcutaneous Q24H Auburn Bilberry, MD      . ferrous sulfate tablet 325 mg  325 mg Oral Q breakfast Auburn Bilberry, MD   325 mg at 12/20/18 0825  . fesoterodine (TOVIAZ) tablet 8 mg  8 mg Oral Daily Auburn Bilberry, MD   8 mg at 12/20/18 0941  . insulin aspart (novoLOG) injection 0-9 Units  0-9 Units Subcutaneous TID AC & HS Oralia Manis, MD   2 Units at 12/20/18 [phone]  . insulin glargine (LANTUS) injection 15 Units  15 Units Subcutaneous Daily Auburn Bilberry, MD   15 Units at  12/20/18 [phone]  . isosorbide mononitrate (IMDUR) 24 hr tablet 30 mg  30 mg Oral Daily Auburn Bilberry, MD   30 mg at 12/20/18 0939  . lisinopril (PRINIVIL,ZESTRIL) tablet 40 mg  40 mg Oral Daily Auburn Bilberry, MD      . metoprolol tartrate (LOPRESSOR) tablet 25 mg  25 mg Oral BID Auburn Bilberry, MD   25 mg at 12/20/18 0939  . mirtazapine (REMERON) tablet 15 mg  15 mg Oral QHS Auburn Bilberry, MD   15 mg at 12/19/18 2352  . morphine (MS CONTIN) 12 hr tablet 15 mg  15 mg Oral Q12H Auburn Bilberry, MD   15 mg at 12/20/18 3244  . morphine (MSIR) tablet 15 mg  15 mg Oral Q8H Auburn Bilberry, MD   15 mg at 12/20/18 0102  . nitroGLYCERIN (NITROSTAT) SL tablet 0.4 mg  0.4 mg Sublingual Q5 min PRN Auburn Bilberry,  MD      . ondansetron (ZOFRAN) tablet 4 mg  4 mg Oral Q6H PRN Auburn BilberryPatel, Shreyang, MD       Or  . ondansetron (ZOFRAN) injection 4 mg  4 mg Intravenous Q6H PRN Auburn BilberryPatel, Shreyang, MD      . pantoprazole (PROTONIX) EC tablet 40 mg  40 mg Oral Daily Auburn BilberryPatel, Shreyang, MD   40 mg at 12/20/18 0939  . pregabalin (LYRICA) capsule 150 mg  150 mg Oral BID Auburn BilberryPatel, Shreyang, MD   150 mg at 12/20/18 0939  . sodium chloride flush (NS) 0.9 % injection 3 mL  3 mL Intravenous Q12H Auburn BilberryPatel, Shreyang, MD      . sodium chloride flush (NS) 0.9 % injection 3 mL  3 mL Intravenous PRN Auburn BilberryPatel, Shreyang, MD      . traZODone (DESYREL) tablet 100 mg  100 mg Oral QHS Auburn BilberryPatel, Shreyang, MD   100 mg at 12/19/18 2352  . vancomycin (VANCOCIN) IVPB 1000 mg/200 mL premix  1,000 mg Intravenous Q12H Hallaji, Sheema M, RPH 200 mL/hr at 12/20/18 0559 1,000 mg at 12/20/18 0559  . venlafaxine (EFFEXOR) tablet 75 mg  75 mg Oral TID WC Auburn BilberryPatel, Shreyang, MD   75 mg at 12/20/18 0825  . vitamin B-12 (CYANOCOBALAMIN) tablet 5,000 mcg  5,000 mcg Oral Daily Auburn BilberryPatel, Shreyang, MD   5,000 mcg at 12/20/18 [phone]     Discharge Medications: Please see discharge summary for a list of discharge medications.  Relevant Imaging Results:  Relevant Lab  Results:   Additional Information (SSN: [id number])  Shamell Suarez, [name], LCSW

## 2018-12-20 NOTE — Progress Notes (Signed)
Pt is a high fall risk. She refuses to have bed alarm on. Pt was educated.

## 2018-12-20 NOTE — Anesthesia Preprocedure Evaluation (Addendum)
Anesthesia Evaluation  Patient identified by MRN, date of birth, ID band Patient awake    Reviewed: Allergy & Precautions, NPO status , Patient's Chart, lab work & pertinent test results, reviewed documented beta blocker date and time   Airway Mallampati: II  TM Distance: >3 FB     Dental  (+) Chipped, Partial Upper   Pulmonary sleep apnea ,           Cardiovascular hypertension, Pt. on medications and Pt. on home beta blockers + CAD, + Past MI and + Cardiac Stents       Neuro/Psych PSYCHIATRIC DISORDERS Anxiety Depression Dementia  Neuromuscular disease CVA, No Residual Symptoms    GI/Hepatic PUD,   Endo/Other  diabetes, Type 2  Renal/GU Renal disease     Musculoskeletal   Abdominal   Peds  Hematology  (+) anemia ,   Anesthesia Other Findings Past Medical History: No date: Anxiety No date: Chronic back pain No date: Collagen vascular disease (HCC) No date: Coronary artery disease     Comment:  a. s/p PCI/DES to dRCA & mLCx in 2014 with repeat LHC in              10/2015 showing patent stents No date: Diabetes mellitus without complication (HCC) No date: Hypertension  EF 55-60. K 4.2. EKG shows poor R waves otherwise ok. No cardiac symptoms this am.  DNR. No date: Low back pain No date: Myocardial infarction (HCC) 01/23/2016: Osteomyelitis of toe (HCC) No date: Stroke [facility](HCC)   Reproductive/Obstetrics                            Anesthesia Physical Anesthesia Plan  ASA: III  Anesthesia Plan: General   Post-op Pain Management:    Induction: Intravenous  PONV Risk Score and Plan:   Airway Management Planned: LMA  Additional Equipment:   Intra-op Plan:   Post-operative Plan:   Informed Consent: I have reviewed the patients History and Physical, chart, labs and discussed the procedure including the risks, benefits and alternatives for the proposed anesthesia with the patient  or authorized representative who has indicated his/her understanding and acceptance.     Plan Discussed with: CRNA  Anesthesia Plan Comments:         Anesthesia Quick Evaluation

## 2018-12-20 NOTE — Clinical Social Work Note (Signed)
Clinical Social Work Assessment  Patient Details  Name: Jessica Donovan MRN: [id number] Date of Birth: Nov 03, 1946  Date of referral:  12/20/18               Reason for consult:  Facility Placement                Permission sought to share information with:  Chartered certified accountant granted to share information::  Yes, Verbal Permission Granted  Name::      [place]::   [place]   Relationship::     Contact Information:     Housing/Transportation Living arrangements for the past 2 months:  [place] of Information:  Patient Patient Interpreter Needed:  None Criminal Activity/Legal Involvement Pertinent to Current Situation/Hospitalization:  No - Comment as needed Significant Relationships:  Friend Lives with:  Self Do you feel safe going back to the place where you live?  Yes Need for family participation in patient care:  Yes (Comment)  Care giving concerns:  Patient lives at [address] apartment complex in [place] alone.    Social Worker assessment / plan:  Holiday representative (CSW) reviewed chart and noted that patient will have a toe amputation tomorrow. PT is pending. CSW met with patient alone at bedside today to discuss D/C plan. Patient was alert and oriented X4 and was laying in the bed. CSW introduced self and explained role of CSW department. Per patient she lives alone in an apartment and her friend Jessica Donovan is her HPOA. Per patient she has 1 adult daughter that she does not have contact with at the daughter's request. CSW explained that after surgery PT will evaluate her and make a recommendation of home health or SNF. Per patient she has been to [facility] in the past and would like to go there again. CSW explained that Health Team will have to approve SNF. Patient verbalized her understanding and is agreeable to SNF search in [place]. FL2 complete and faxed out. CSW will continue to follow and assist as  needed.    Employment status:  Disabled (Comment on whether or not currently receiving Disability), Retired Nurse, adult PT Recommendations:  Not assessed at this time Information / Referral to community resources:  [facility](SNF vs. [place] )  Patient/Family's Response to care:  Patient prefers to go to SNF.   Patient/Family's Understanding of and Emotional Response to Diagnosis, Current Treatment, and Prognosis:  Patient was very pleasant and thanked CSW for assistance.   Emotional Assessment Appearance:  Appears stated age Attitude/Demeanor/Rapport:    Affect (typically observed):  Accepting, Adaptable, Pleasant Orientation:  Oriented to Self, Oriented to Place, Oriented to  Time, Oriented to Situation Alcohol / Substance use:  Not Applicable Psych involvement (Current and /or in the community):  No (Comment)  Discharge Needs  Concerns to be addressed:  Discharge Planning Concerns Readmission within the last 30 days:  No Current discharge risk:  Dependent with Mobility, Chronically ill Barriers to Discharge:  Continued Medical Work up   [employer], Jessica Beets, LCSW 12/20/2018, 2:12 PM

## 2018-12-20 NOTE — Progress Notes (Signed)
Family Meeting Note  Advance Directive:no  Today a meeting took place with the Patient. The following clinical team members were present during this meeting:MD  The following were discussed:Patient's diagnosis: Osteomyelitis right foot second and third digit, Patient's progosis: > 12 months and Goals for treatment: DNR  Additional follow-up to be provided: DNR ordered and yellow sheet signed and placed in patient's chart.  Time spent during discussion:16 minutes  Jessica Mabus, MD

## 2018-12-20 NOTE — Progress Notes (Signed)
Inpatient Diabetes Program Recommendations  AACE/ADA: New Consensus Statement on Inpatient Glycemic Control (2019)  Target Ranges:  Prepandial:   less than 140 mg/dL      [facility] postprandial:   less than 180 mg/dL (1-2 hours)      Critically ill patients:  140 - 180 mg/dL   Results for Jessica Donovan, Caryl (MRN [id number]) as of 12/20/2018 13:34  Ref. Range 12/19/2018 23:57 12/20/2018 05:50 12/20/2018 07:50 12/20/2018 11:41  Glucose-Capillary Latest Ref Range: 70 - 99 mg/dL 841263 (H) 324186 (H) 401187 (H) 313 (H)  Results for Jessica Donovan, Jessica Donovan (MRN [id number]) as of 12/20/2018 13:34  Ref. Range 09/11/2018 04:20  Hemoglobin A1C Latest Ref Range: 4.8 - 5.6 % 7.4 (H)   Review of Glycemic Control  Diabetes history: DM2 Outpatient Diabetes medications: Toujeo 15-50 units daily (prescribed 15 units daily; per home med list pt reports taking 50 units if glucose >130 mg/dl), Novolog 4-033-15 units TID with meals Current orders for Inpatient glycemic control: Lantus 15 units daily, Novolog 0-9 units AC&HS  Inpatient Diabetes Program Recommendations:  Correction (SSI): Noted patient will be NPO after midnight for toe amputation tomorrow. Please consider changing frequency of CBGs and Novolog 0-9 units to Q4H for closer monitoring and correction of glycemic control.  Insulin - Meal Coverage: Please consider ordering Novolog 5 units TID with meals for meal coverage if patient eats at least 50% of meals. HgbA1C: Last A1C 7.4% on 09/11/18 indicating an average glucose of 166 mg/dl. Please consider ordering a current A1C.  NOTE: Noted consult for Diabetes Coordinator. Chart reviewed and recommendations made. Will continue to follow daily and make further recommendations if needed as more data is collected on glycemic control.   Thanks, Orlando PennerMarie Xavious Sharrar, RN, MSN, CDE Diabetes Coordinator Inpatient Diabetes Program [phone] (Team Pager from 8am to 5pm)

## 2018-12-20 NOTE — Progress Notes (Signed)
   12/20/18 1715  Clinical Encounter Type  Visited With Patient  Visit Type Follow-up;Spiritual support (order for prayer with patient)  Spiritual Encounters  Spiritual Needs Emotional;Grief support;Prayer   Chaplain followed up from earlier interrupted encounter as well as having an OR for same patient.  Patient continued conversation regarding support system, family dynamics, and life without spouse.  Patient reported having had history of strokes which impacted her short-term memory.  Chaplain utilized active and reflective listening to offer emotional support to patient and establish time line of events patient was processing.  Chaplain paged away from encounter.  Chaplain spoke with patient about prayer and offered to hold patient in prayer as chaplain moves through her evening.

## 2018-12-21 ENCOUNTER — Encounter
Admission: EM | Disposition: A | Discharge status: Skilled Nursing Facility | Payer: Self-pay | Source: Home / Self Care | Attending: Internal Medicine

## 2018-12-21 ENCOUNTER — Inpatient Hospital Stay: Payer: PPO | Admitting: Anesthesiology

## 2018-12-21 HISTORY — PX: AMPUTATION TOE: SHX6595

## 2018-12-21 LAB — CBC
HCT: 36 % (ref 36.0–46.0)
HEMOGLOBIN: 11.3 g/dL — AB (ref 12.0–15.0)
MCH: 32.1 pg (ref 26.0–34.0)
MCHC: 31.4 g/dL (ref 30.0–36.0)
MCV: 102.3 fL — ABNORMAL HIGH (ref 80.0–100.0)
Platelets: 347 10*3/uL (ref 150–400)
RBC: 3.52 MIL/uL — ABNORMAL LOW (ref 3.87–5.11)
RDW: 12.5 % (ref 11.5–15.5)
WBC: 7.1 10*3/uL (ref 4.0–10.5)
nRBC: 0 % (ref 0.0–0.2)

## 2018-12-21 LAB — BASIC METABOLIC PANEL
Anion gap: 9 (ref 5–15)
BUN: 13 mg/dL (ref 8–23)
CO2: 28 mmol/L (ref 22–32)
Calcium: 9.2 mg/dL (ref 8.9–10.3)
Chloride: 100 mmol/L (ref 98–111)
Creatinine, Ser: 0.61 mg/dL (ref 0.44–1.00)
GFR calc non Af Amer: >60 mL/min (ref >60)
Glucose, Bld: 157 mg/dL — ABNORMAL HIGH (ref 70–99)
Potassium: 4.1 mmol/L (ref 3.5–5.1)
Sodium: 137 mmol/L (ref 135–145)

## 2018-12-21 LAB — GLUCOSE, CAPILLARY
GLUCOSE-CAPILLARY: 178 mg/dL — AB (ref 70–99)
GLUCOSE-CAPILLARY: 253 mg/dL — AB (ref 70–99)
Glucose-Capillary: 145 mg/dL — ABNORMAL HIGH (ref 70–99)
Glucose-Capillary: 225 mg/dL — ABNORMAL HIGH (ref 70–99)
Glucose-Capillary: 148 mg/dL — ABNORMAL HIGH (ref 70–99)

## 2018-12-21 LAB — VANCOMYCIN, TROUGH: Vancomycin Tr: 15 ug/mL (ref 15–20)

## 2018-12-21 SURGERY — AMPUTATION, TOE
Anesthesia: General | Laterality: Right

## 2018-12-21 MED ORDER — LIDOCAINE HCL 1 % IJ SOLN
INTRAMUSCULAR | Status: DC | PRN
  Administered 2018-12-21: 5 mL

## 2018-12-21 MED ORDER — BUPIVACAINE HCL (PF) 0.5 % IJ SOLN
INTRAMUSCULAR | Status: AC
  Filled 2018-12-21: qty 30

## 2018-12-21 MED ORDER — PROPOFOL 10 MG/ML IV BOLUS
INTRAVENOUS | Status: DC | PRN
  Administered 2018-12-21: 100 mg via INTRAVENOUS

## 2018-12-21 MED ORDER — PROPOFOL 500 MG/50ML IV EMUL
INTRAVENOUS | Status: AC
  Filled 2018-12-21: qty 50

## 2018-12-21 MED ORDER — LACTATED RINGERS IV SOLN
INTRAVENOUS | Status: DC | PRN
  Administered 2018-12-21: 08:00:00 via INTRAVENOUS

## 2018-12-21 MED ORDER — ONDANSETRON HCL 4 MG/2ML IJ SOLN: 4.0000 mg | Freq: Once | INTRAMUSCULAR | Status: DC | PRN

## 2018-12-21 MED ORDER — MIDAZOLAM HCL 2 MG/2ML IJ SOLN
INTRAMUSCULAR | Status: DC | PRN
  Administered 2018-12-21: 2 mg via INTRAVENOUS

## 2018-12-21 MED ORDER — FENTANYL CITRATE (PF) 100 MCG/2ML IJ SOLN
INTRAMUSCULAR | Status: AC
  Filled 2018-12-21: qty 2

## 2018-12-21 MED ORDER — LIDOCAINE HCL (PF) 1 % IJ SOLN
INTRAMUSCULAR | Status: AC
  Filled 2018-12-21: qty 30

## 2018-12-21 MED ORDER — MIDAZOLAM HCL 2 MG/2ML IJ SOLN
INTRAMUSCULAR | Status: AC
  Filled 2018-12-21: qty 2

## 2018-12-21 MED ORDER — LIDOCAINE HCL (CARDIAC) PF 100 MG/5ML IV SOSY
PREFILLED_SYRINGE | INTRAVENOUS | Status: DC | PRN
  Administered 2018-12-21: 100 mg via INTRAVENOUS

## 2018-12-21 MED ORDER — FENTANYL CITRATE (PF) 100 MCG/2ML IJ SOLN
INTRAMUSCULAR | Status: DC | PRN
  Administered 2018-12-21 (×2): 50 ug via INTRAVENOUS

## 2018-12-21 MED ORDER — SEVOFLURANE IN SOLN
RESPIRATORY_TRACT | Status: AC
  Filled 2018-12-21: qty 250

## 2018-12-21 MED ORDER — FENTANYL CITRATE (PF) 100 MCG/2ML IJ SOLN: 25.0000 ug | INTRAMUSCULAR | Status: DC | PRN

## 2018-12-21 MED ORDER — BUPIVACAINE HCL 0.5 % IJ SOLN
INTRAMUSCULAR | Status: DC | PRN
  Administered 2018-12-21: 5 mL

## 2018-12-21 MED ORDER — GLYCOPYRROLATE 0.2 MG/ML IJ SOLN
INTRAMUSCULAR | Status: DC | PRN
  Administered 2018-12-21: 0.2 mg via INTRAVENOUS

## 2018-12-21 SURGICAL SUPPLY — 36 items
BANDAGE ELASTIC 4 LF NS (GAUZE/BANDAGES/DRESSINGS) ×3 IMPLANT
BLADE MED AGGRESSIVE (BLADE) ×3 IMPLANT
BLADE SURG 15 STRL LF DISP TIS (BLADE) ×4 IMPLANT
BLADE SURG 15 STRL SS (BLADE) ×8
BNDG CONFORM 3 STRL LF (GAUZE/BANDAGES/DRESSINGS) ×3 IMPLANT
BNDG ESMARK 4X12 TAN STRL LF (GAUZE/BANDAGES/DRESSINGS) ×3 IMPLANT
BNDG GAUZE 4.5X4.1 6PLY STRL (MISCELLANEOUS) ×3 IMPLANT
CANISTER SUCT 1200ML W/VALVE (MISCELLANEOUS) ×3 IMPLANT
COVER WAND RF STERILE (DRAPES) ×3 IMPLANT
CUFF TOURN 18 STER (MISCELLANEOUS) ×3 IMPLANT
CUFF TOURN DUAL PL 12 NO SLV (MISCELLANEOUS) ×3 IMPLANT
DRAPE FLUOR MINI C-ARM 54X84 (DRAPES) ×3 IMPLANT
DURAPREP 26ML APPLICATOR (WOUND CARE) ×3 IMPLANT
ELECT REM PT RETURN 9FT ADLT (ELECTROSURGICAL) ×3
ELECTRODE REM PT RTRN 9FT ADLT (ELECTROSURGICAL) ×1 IMPLANT
GAUZE PETRO XEROFOAM 1X8 (MISCELLANEOUS) ×3 IMPLANT
GAUZE SPONGE 4X4 12PLY STRL (GAUZE/BANDAGES/DRESSINGS) ×3 IMPLANT
GLOVE BIO SURGEON STRL SZ8 (GLOVE) ×3 IMPLANT
GLOVE INDICATOR 8.0 STRL GRN (GLOVE) ×3 IMPLANT
GOWN STRL REUS W/ TWL LRG LVL3 (GOWN DISPOSABLE) ×2 IMPLANT
GOWN STRL REUS W/TWL LRG LVL3 (GOWN DISPOSABLE) ×4
KIT TURNOVER KIT A (KITS) ×3 IMPLANT
LABEL OR SOLS (LABEL) ×3 IMPLANT
NDL SAFETY ECLIPSE 18X1.5 (NEEDLE) ×1 IMPLANT
NEEDLE HYPO 18GX1.5 SHARP (NEEDLE) ×2
NEEDLE HYPO 25X1 1.5 SAFETY (NEEDLE) ×9 IMPLANT
NS IRRIG 500ML POUR BTL (IV SOLUTION) ×3 IMPLANT
PACK EXTREMITY ARMC (MISCELLANEOUS) ×3 IMPLANT
PENCIL ELECTRO HAND CTR (MISCELLANEOUS) ×3 IMPLANT
RASP SM TEAR CROSS CUT (RASP) ×3 IMPLANT
STOCKINETTE STRL 6IN 960660 (GAUZE/BANDAGES/DRESSINGS) ×3 IMPLANT
SUT ETH BLK MONO 3 0 FS 1 12/B (SUTURE) ×3 IMPLANT
SUT ETHILON 5 0 PS 2 18 (SUTURE) ×3 IMPLANT
SUT VIC AB 4-0 FS2 27 (SUTURE) ×3 IMPLANT
SWAB CULTURE AMIES ANAERIB BLU (MISCELLANEOUS) ×3 IMPLANT
SYR 10ML LL (SYRINGE) ×3 IMPLANT

## 2018-12-21 NOTE — Transfer of Care (Signed)
Immediate Anesthesia Transfer of Care Note  Patient: Jessica Donovan  Procedure(s) Performed: 2ND TOE AMPUTATION WITH DEBRIDEMENT OF SOFT TISSUE (Right )  Patient Location: PACU  Anesthesia Type:General  Level of Consciousness: sedated  Airway & Oxygen Therapy: Patient Spontanous Breathing and Patient connected to face mask oxygen  Post-op Assessment: Report given to RN and Post -op Vital signs reviewed and stable  Post vital signs: Reviewed and stable  Last Vitals:  Vitals Value Taken Time  BP 158/73 12/21/2018  9:18 AM  Temp    Pulse 71 12/21/2018  9:18 AM  Resp 14 12/21/2018  9:18 AM  SpO2 96 % 12/21/2018  9:18 AM  Vitals shown include unvalidated device data.  Last Pain:  Vitals:   12/21/18 0527  TempSrc:   PainSc: Asleep         Complications: No apparent anesthesia complications

## 2018-12-21 NOTE — Progress Notes (Signed)
Clinical Education officer, museum (CSW) met with patient and presented bed offers. Patient reported that she prefers to D/C home but chose [facility] if she needs to go to SNF. CSW explained that Health Team will have to approve SNF. Patient verbalized her understanding. [facility] liaison is aware of above.    [employer], LCSW [phone]

## 2018-12-21 NOTE — Progress Notes (Signed)
Pt returned to room. VSS. Appears in NAD. C/o pain to right foot 5/10. Pt asking for scheduled meds. Pt educated to sip clears then RN will bring scheduled medication.  Pt verbalizes understanding.

## 2018-12-21 NOTE — Anesthesia Procedure Notes (Signed)
Procedure Name: LMA Insertion Date/Time: 12/21/2018 8:35 AM Performed by: Junious SilkNoles, Eben Choinski, CRNA Pre-anesthesia Checklist: Patient identified, Patient being monitored, Timeout performed, Emergency Drugs available and Suction available Patient Re-evaluated:Patient Re-evaluated prior to induction Oxygen Delivery Method: Circle system utilized Preoxygenation: Pre-oxygenation with 100% oxygen Induction Type: IV induction Ventilation: Mask ventilation without difficulty LMA: LMA inserted LMA Size: 3.5 Tube type: Oral Number of attempts: 1 Placement Confirmation: positive ETCO2 and breath sounds checked- equal and bilateral Tube secured with: Tape Dental Injury: Teeth and Oropharynx as per pre-operative assessment

## 2018-12-21 NOTE — Progress Notes (Signed)
Pharmacy Antibiotic Note  Jessica Donovan is a 73 y.o. female admitted on 12/19/2018 with a diabetic foot infection.  Pharmacy has been consulted for Cefepime and vancomycin dosing. Patient has a history of osteomyelitis requiring toe amputation in the past. Vancomycin 1g IV ordered in ED.   Plan: Vancomycin 1000 IV every 12 hours with 6 hour stack dosing.  Goal trough 15-20 mcg/mL. Will continue current regimen. Trough level returned at 15 (level drawn 11 hours post dose). Will continue to monitor and order a lab in 4-5 days or earlier if clinically necessary.   Continue cefepime 2g every 8 hours  Height: 5\' 7"  (170.2 cm) Weight: 181 lb 3.5 oz (82.2 kg) IBW/kg (Calculated) : 61.6  Temp (24hrs), Avg:97.6 F (36.4 C), Min:97 F (36.1 C), Max:98.7 F (37.1 C)  Recent Labs  Lab 12/19/18 1637 12/19/18 1638 12/19/18 1918 12/20/18 0330 12/21/18 0310 12/21/18 1638  WBC  --  10.9*  --  7.9 7.1  --   CREATININE  --  0.73  --  0.53 0.61  --   LATICACIDVEN 2.2*  --  1.8  --   --   --   VANCOTROUGH  --   --   --   --   --  15    Estimated Creatinine Clearance: 70 mL/min (by C-G formula based on SCr of 0.61 mg/dL).    Allergies  Allergen Reactions  . Bactrim [Sulfamethoxazole-Trimethoprim]   . Codeine Nausea And Vomiting  . Flu Virus Vaccine     Other reaction(s): Other (See Comments) Passed out for 12 days  . Influenza Vaccines Other (See Comments)    Reaction:  Caused pt to pass out   . Methadone Hives and Itching  . Oxycodone-Acetaminophen Nausea And Vomiting  . Percocet [Oxycodone-Acetaminophen] Nausea And Vomiting  . Sulfa Antibiotics   . Tetanus Antitoxin   . Tetanus Toxoids Swelling and Other (See Comments)    Reaction:  Swelling at injection site  . Penicillin G Rash    Has patient had a PCN reaction causing immediate rash, facial/tongue/throat swelling, SOB or lightheadedness with hypotension: Yes Has patient had a PCN reaction causing severe rash involving mucus  membranes or skin necrosis: No Has patient had a PCN reaction that required hospitalization: No Has patient had a PCN reaction occurring within the last 10 years: No If all of the above answers are "NO", then may proceed with Cephalosporin use..  . Tetracyclines & Related Rash    Antimicrobials this admission: 12/30 Vancomycin  >>  12/30 cefepime >>    Thank you for allowing pharmacy to be a part of this patient's care.  Ronnald Ramp, PharmD, BCPS Clinical Pharmacist 12/21/2018 6:06 PM

## 2018-12-21 NOTE — Op Note (Signed)
Operative note   Surgeon: Dr. Recardo Evangelist, DPM.    Assistant: None    Preop diagnosis: Osteomyelitis and infection second toe right foot    Postop diagnosis: Same    Procedure:   1.  Amputation to the MTP joint second toe right foot          EBL: 25 cc    Anesthesia:IV sedation delivered by the anesthesia team and I delivered 10 cc of lidocaine and Marcaine mixed preoperatively at the base of the surgical site    Hemostasis: Ankle tourniquet 2 and 30 mils mercury pressure first time for 10 minutes second time for 8 minutes tourniquet was released in the interim and promptly vascularity return to digits at that point as well as at the end of the case manager least.    Specimen: Amputated second toe sent to pathology and bone culture from the proximal phalanx was sent to lab for culture and sensitivity    Complications: None    Operative indications: Open diabetic wound with bone infection and cellulitis to the second toe right foot at the PIP joint level.    Procedure:  Patient was brought into the OR and placed on the operating table in thesupine position. After anesthesia was obtained theright lower extremity was prepped and draped in usual sterile fashion.  Operative Report: This time attention was directed to the second toe of the right foot tourniquet was elevated and to look incisions were made around the base of the second toe with the dorsal incision extending proximally to make sure there is no purulence or infection spreading proximally.  Incision was deepened down to bone the soft tissue around the MTP joint was released and the toe was removed.  At this point the entire wound was inspected no areas of necrosis or purulence were noted across the region.  No pus pockets were found that with exploration of blunt blunt probing.  Second toe was addressed and the proximal phalanx was identified and a specimen of bone from the proximal middle phalanges were then sent for  culture and sensitivity.  The toe was sent for pathology.  After copious irrigation the tourniquet was released and the patient had significant capillary bleeding but no pulsatile flow at this point.  The wound was packed with Surgifoam for approximately 10 to 12 minutes and compressed this helps slow down the bleeding process.  This point I elected to remove the Surgifoam and re-elevate the tourniquet irrigated again and closed the wound with 4-0 Vicryl to close the capsular tissue over the metatarsal head and 2 sutures subcutaneous tissue.  Skin was closed with 3-0 nylon simple interrupted sutures.  The area was then dressed with a sterile compressive dressing consisting of Xeroform gauze 4 x 4's Kling and Kerlix and the tourniquet is released and prompt vascular seen return the residual digits of the right foot.  No bleeding occurred through the bandage.    Patient tolerated the procedure and anesthesia well.  Was transported from the OR to the PACU with all vital signs stable and vascular status intact. To be discharged per routine protocol.  Will follow up in approximately 1 week in the outpatient clinic.

## 2018-12-21 NOTE — Anesthesia Post-op Follow-up Note (Signed)
Anesthesia QCDR form completed.        

## 2018-12-21 NOTE — Anesthesia Postprocedure Evaluation (Signed)
Anesthesia Post Note  Patient: Jessica Donovan  Procedure(s) Performed: 2ND TOE AMPUTATION WITH DEBRIDEMENT OF SOFT TISSUE (Right )  Patient location during evaluation: PACU Anesthesia Type: General Level of consciousness: awake and alert Pain management: pain level controlled Vital Signs Assessment: post-procedure vital signs reviewed and stable Respiratory status: spontaneous breathing, nonlabored ventilation, respiratory function stable and patient connected to nasal cannula oxygen Cardiovascular status: blood pressure returned to baseline and stable Postop Assessment: no apparent nausea or vomiting Anesthetic complications: no     Last Vitals:  Vitals:   12/21/18 1019 12/21/18 1116  BP: (!) 131/51 125/62  Pulse: 69 75  Resp: 18 18  Temp: (!) 36.4 C (!) 36.1 C  SpO2: 98% 100%    Last Pain:  Vitals:   12/21/18 1035  TempSrc:   PainSc: 5                  Shavonn Convey S

## 2018-12-21 NOTE — Progress Notes (Signed)
[facility] [place] at [facility]   PATIENT NAME: Jessica Donovan    MR#:  [id number]  DATE OF BIRTH:  05-12-1946  SUBJECTIVE:   no acute issues overnight .  Patient was taken to the OR for right toe amputation today. Currently resting comfortably.   REVIEW OF SYSTEMS:    Review of Systems  Constitutional: Negative for fever, chills weight loss HENT: Negative for ear pain, nosebleeds, congestion, facial swelling, rhinorrhea, neck pain, neck stiffness and ear discharge.   Respiratory: Negative for cough, shortness of breath, wheezing  Cardiovascular: Negative for chest pain, palpitations and leg swelling.  Gastrointestinal: Negative for heartburn, abdominal pain, vomiting, diarrhea or consitpation Genitourinary: Negative for dysuria, urgency, frequency, hematuria Musculoskeletal: Negative for back pain or joint pain Neurological: Negative for dizziness, seizures, syncope, focal weakness,  numbness and headaches.  Hematological: Does not bruise/bleed easily.  Psychiatric/Behavioral: Negative for hallucinations, confusion, dysphoric mood     DRUG ALLERGIES:   Allergies  Allergen Reactions  . Bactrim [Sulfamethoxazole-Trimethoprim]   . Codeine Nausea And Vomiting  . Flu Virus Vaccine     Other reaction(s): Other (See Comments) Passed out for 12 days  . Influenza Vaccines Other (See Comments)    Reaction:  Caused pt to pass out   . Methadone Hives and Itching  . Oxycodone-Acetaminophen Nausea And Vomiting  . Percocet [Oxycodone-Acetaminophen] Nausea And Vomiting  . Sulfa Antibiotics   . Tetanus Antitoxin   . Tetanus Toxoids Swelling and Other (See Comments)    Reaction:  Swelling at injection site  . Penicillin G Rash    Has patient had a PCN reaction causing immediate rash, facial/tongue/throat swelling, SOB or lightheadedness with hypotension: Yes Has patient had a PCN reaction causing severe rash involving mucus membranes or skin necrosis: No Has patient  had a PCN reaction that required hospitalization: No Has patient had a PCN reaction occurring within the last 10 years: No If all of the above answers are "NO", then may proceed with Cephalosporin use..  . Tetracyclines & Related Rash    VITALS:  Blood pressure 125/62, pulse 75, temperature (!) 97 F (36.1 C), resp. rate 18, height 5\' 7"  (1.702 m), weight 82.2 kg, SpO2 100 %.  PHYSICAL EXAMINATION:  Constitutional: Appears well-developed and well-nourished. No distress. HENT: Normocephalic. [name] Oropharynx is clear and moist.  Eyes: Conjunctivae and EOM are normal. PERRLA, no scleral icterus.  Neck: Normal ROM. Neck supple. No JVD. No tracheal deviation. CVS: RRR, S1/S2 +, no murmurs, no gallops,  Pulmonary: Effort and breath sounds normal, no stridor, rhonchi, wheezes, rales.  Abdominal: Soft. BS +,  no distension, tenderness, rebound or guarding.  Musculoskeletal: Dressing in place to right foot at site of amputation. Neuro: Alert. CN 2-12 grossly intact. No focal deficits. Skin: Dressing in place to right foot at site of amputation. Psychiatric: Normal mood and affect.      LABORATORY PANEL:   CBC Recent Labs  Lab 12/21/18 0310  WBC 7.1  HGB 11.3*  HCT 36.0  PLT 347   ------------------------------------------------------------------------------------------------------------------  Chemistries  Recent Labs  Lab 12/19/18 1638  12/21/18 0310  NA 135   < > 137  K 4.0   < > 4.1  CL 99   < > 100  CO2 24   < > 28  GLUCOSE 235*   < > 157*  BUN 19   < > 13  CREATININE 0.73   < > 0.61  CALCIUM 9.4   < > 9.2  AST 28  --   --  ALT 16  --   --   ALKPHOS 78  --   --   BILITOT 0.3  --   --    < > = values in this interval not displayed.   ------------------------------------------------------------------------------------------------------------------  Cardiac Enzymes No results for input(s): TROPONINI in the last 168  hours. ------------------------------------------------------------------------------------------------------------------  RADIOLOGY:  Mr Foot Right W Wo Contrast  Result Date: 12/20/2018 CLINICAL DATA:  Right second toe swelling, pain and redness. Prior history of osteomyelitis with amputation of the great toe. EXAM: MRI OF THE RIGHT FOREFOOT WITHOUT AND WITH CONTRAST TECHNIQUE: Multiplanar, multisequence MR imaging of the right foot was performed before and after the administration of intravenous contrast. CONTRAST:  8.5 cc Gadavist COMPARISON:  Radiographs 12/19/2018 FINDINGS: Prior amputation of the right great toe. The first metatarsal is unremarkable. No findings for fracture or osteomyelitis. There is diffuse T1 and T2 signal abnormality and contrast enhancement in the proximal and middle phalanges of the second toe consistent with osteomyelitis. There is also associated diffuse surrounding subcutaneous soft tissue swelling/edema, fluid and enhancement. I do not see a discrete drainable soft tissue abscess. The other bony structures are intact. No other areas of osteomyelitis are identified. There is diffuse cellulitis and myofasciitis but no findings for pyomyositis. IMPRESSION: 1. MR findings consistent with osteomyelitis involving the proximal and middle phalanges of the second toe. 2. Diffuse and marked soft tissue swelling/edema and fluid involving the second toe consistent with severe cellulitis. No discrete drainable soft tissue abscess. 3. Diffuse forefoot cellulitis and myofasciitis but no findings for pyomyositis. Electronically Signed   By: Rudie Meyer M.D.   On: 12/20/2018 07:57   Dg Foot Complete Right  Result Date: 12/19/2018 CLINICAL DATA:  Total infection EXAM: RIGHT FOOT COMPLETE - 3+ VIEW COMPARISON:  06/22/2017 FINDINGS: No acute displaced fracture or malalignment. Chronic fracture deformity distal fifth metatarsal. Status post amputation first digit at the MTP joint. No  periostitis or bone destruction. No soft tissue emphysema. IMPRESSION: No acute osseous abnormality. Electronically Signed   By: Jasmine Pang M.D.   On: 12/19/2018 19:51     ASSESSMENT AND PLAN:   73 year old female with history of CAD and diabetes who presented due to right second toe swelling and redness.  1. Osteomyelitis involving the proximal and middle phalanges of the second toe: Continue broad-spectrum antibiotics with vancomycin and cefepime. Patient was taken to the OR by podiatrist today for amputation of second and third toe.Appreciate podiatry consultation Follow-up with podiatrist for further recommendation regarding duration of IV antibiotics treatment.  2.  Diabetes: Continue sliding scale, Lantus and ADA diet  3.  Essential hypertension: Continue isosorbide, lisinopril, metoprolol  4.  Depression: Continue Effexor, Remeron, Xanax and trazodone  5.  Anemia of chronic disease: Continue ferrous sulfate  6.  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  Hyperlipidemia: Continue statin  8.  CAD: Continue aspirin, statin and metoprolol Stable  DVT prophylaxis; Lovenox    Management plans discussed with the patient and she is in agreement.  CODE STATUS: DNR  TOTAL TIME TAKING CARE OF THIS PATIENT: 30 minutes.     POSSIBLE D/C 2-3 days, DEPENDING ON CLINICAL CONDITION.   Odie Rauen M.D on 12/21/2018 at 2:31 PM  After 6pm go to [email or web address] - password [email or web address]  [facility]  Office  [phone]  CC: Primary care physician; Garlon Hatchet, MD  Note: This dictation was prepared with Dragon dictation along with smaller phrase technology. Any transcriptional errors that result from this process are  unintentional. 

## 2018-12-21 NOTE — Progress Notes (Addendum)
Pt placed on bedpan. Pt states her lunch tray never arrived. Called dietary, pt did not order lunch. Pt states she does not remember if she did or did not order. Pt denies pain at this time. Pt educated to call when ready to come off bedpan. Pt provided call bell. Pt verbalizes understanding.

## 2018-12-21 NOTE — Progress Notes (Signed)
Pt able to be aroused, however, falls asleep mid sentence. Pt also with short term memory loss. Pt states she has pushed call bell "several times and no one came to help me". Both Diplomatic Services operational officersecretary and CN state pt has not called. Pt does not remember RN coming to room to speak with her regarding her antibiotic. Dr Imogene Burnhen notified and states to hold medication until pt is more awake.

## 2018-12-22 ENCOUNTER — Encounter: Payer: Self-pay | Admitting: Podiatry

## 2018-12-22 LAB — BASIC METABOLIC PANEL
Anion gap: 8 (ref 5–15)
BUN: 12 mg/dL (ref 8–23)
CO2: 28 mmol/L (ref 22–32)
CREATININE: 0.62 mg/dL (ref 0.44–1.00)
Calcium: 9 mg/dL (ref 8.9–10.3)
Chloride: 102 mmol/L (ref 98–111)
GFR calc Af Amer: >60 mL/min (ref >60)
GFR calc non Af Amer: >60 mL/min (ref >60)
Glucose, Bld: 148 mg/dL — ABNORMAL HIGH (ref 70–99)
Potassium: 3.8 mmol/L (ref 3.5–5.1)
Sodium: 138 mmol/L (ref 135–145)

## 2018-12-22 LAB — MAGNESIUM: Magnesium: 1.9 mg/dL (ref 1.7–2.4)

## 2018-12-22 LAB — GLUCOSE, CAPILLARY
Glucose-Capillary: 136 mg/dL — ABNORMAL HIGH (ref 70–99)
Glucose-Capillary: 177 mg/dL — ABNORMAL HIGH (ref 70–99)
Glucose-Capillary: 367 mg/dL — ABNORMAL HIGH (ref 70–99)
Glucose-Capillary: 209 mg/dL — ABNORMAL HIGH (ref 70–99)
Glucose-Capillary: 145 mg/dL — ABNORMAL HIGH (ref 70–99)

## 2018-12-22 LAB — CBC
HCT: 36.8 % (ref 36.0–46.0)
Hemoglobin: 11.6 g/dL — ABNORMAL LOW (ref 12.0–15.0)
MCH: 32 pg (ref 26.0–34.0)
MCHC: 31.5 g/dL (ref 30.0–36.0)
MCV: 101.4 fL — ABNORMAL HIGH (ref 80.0–100.0)
PLATELETS: 357 10*3/uL (ref 150–400)
RBC: 3.63 MIL/uL — ABNORMAL LOW (ref 3.87–5.11)
RDW: 12.8 % (ref 11.5–15.5)
WBC: 8.4 10*3/uL (ref 4.0–10.5)
nRBC: 0 % (ref 0.0–0.2)

## 2018-12-22 MED ORDER — POLYVINYL ALCOHOL 1.4 % OP SOLN
1.0000 [drp] | OPHTHALMIC | Status: DC | PRN
  Administered 2018-12-23: 1 [drp] via OPHTHALMIC
  Filled 2018-12-22: qty 15

## 2018-12-22 NOTE — Progress Notes (Signed)
[facility] - [place] at [facility]   PATIENT NAME: Mellody DanceBarbara Gatlin    MR#:  [id number]  DATE OF BIRTH:  1946-01-18  SUBJECTIVE:   No acute issues overnight .  Patient awake and alert and oriented this morning.  Had amputation done by podiatrist yesterday.  REVIEW OF SYSTEMS:    Review of Systems  Constitutional: Negative for fever, chills weight loss HENT: Negative for ear pain, nosebleeds, congestion, facial swelling, rhinorrhea, neck pain, neck stiffness and ear discharge.   Respiratory: Negative for cough, shortness of breath, wheezing  Cardiovascular: Negative for chest pain, palpitations and leg swelling.  Gastrointestinal: Negative for heartburn, abdominal pain, vomiting, diarrhea or consitpation Genitourinary: Negative for dysuria, urgency, frequency, hematuria Musculoskeletal: Negative for back pain or joint pain Neurological: Negative for dizziness, seizures, syncope, focal weakness,  numbness and headaches.  Hematological: Does not bruise/bleed easily.  Psychiatric/Behavioral: Negative for hallucinations, confusion, dysphoric mood     DRUG ALLERGIES:   Allergies  Allergen Reactions  . Bactrim [Sulfamethoxazole-Trimethoprim]   . Codeine Nausea And Vomiting  . Flu Virus Vaccine     Other reaction(s): Other (See Comments) Passed out for 12 days  . Influenza Vaccines Other (See Comments)    Reaction:  Caused pt to pass out   . Methadone Hives and Itching  . Oxycodone-Acetaminophen Nausea And Vomiting  . Percocet [Oxycodone-Acetaminophen] Nausea And Vomiting  . Sulfa Antibiotics   . Tetanus Antitoxin   . Tetanus Toxoids Swelling and Other (See Comments)    Reaction:  Swelling at injection site  . Penicillin G Rash    Has patient had a PCN reaction causing immediate rash, facial/tongue/throat swelling, SOB or lightheadedness with hypotension: Yes Has patient had a PCN reaction causing severe rash involving mucus membranes or skin necrosis: No Has  patient had a PCN reaction that required hospitalization: No Has patient had a PCN reaction occurring within the last 10 years: No If all of the above answers are "NO", then may proceed with Cephalosporin use..  . Tetracyclines & Related Rash    VITALS:  Blood pressure 122/89, pulse 85, temperature 98.4 F (36.9 C), temperature source Oral, resp. rate 16, height 5\' 7"  (1.702 m), weight 82.2 kg, SpO2 94 %.  PHYSICAL EXAMINATION:  Constitutional: Appears well-developed and well-nourished. No distress. HENT: Normocephalic. [name]. Oropharynx is clear and moist.  Eyes: Conjunctivae and EOM are normal. PERRLA, no scleral icterus.  Neck: Normal ROM. Neck supple. No JVD. No tracheal deviation. CVS: RRR, S1/S2 +, no murmurs, no gallops,  Pulmonary: Effort and breath sounds normal, no stridor, rhonchi, wheezes, rales.  Abdominal: Soft. BS +,  no distension, tenderness, rebound or guarding.  Musculoskeletal: Dressing in place to right foot at site of amputation. Neuro: Alert. CN 2-12 grossly intact. No focal deficits. Skin: Dressing in place to right foot at site of amputation. Psychiatric: Normal mood and affect.      LABORATORY PANEL:   CBC Recent Labs  Lab 12/22/18 0423  WBC 8.4  HGB 11.6*  HCT 36.8  PLT 357   ------------------------------------------------------------------------------------------------------------------  Chemistries  Recent Labs  Lab 12/19/18 1638  12/22/18 0423  NA 135   < > 138  K 4.0   < > 3.8  CL 99   < > 102  CO2 24   < > 28  GLUCOSE 235*   < > 148*  BUN 19   < > 12  CREATININE 0.73   < > 0.62  CALCIUM 9.4   < > 9.0  MG  --   --  1.9  AST 28  --   --   ALT 16  --   --   ALKPHOS 78  --   --   BILITOT 0.3  --   --    < > = values in this interval not displayed.   ------------------------------------------------------------------------------------------------------------------  Cardiac Enzymes No results for input(s): TROPONINI in the last 168  hours. ------------------------------------------------------------------------------------------------------------------  RADIOLOGY:  Mr Foot Right W Wo Contrast  Result Date: 12/20/2018 CLINICAL DATA:  Right second toe swelling, pain and redness. Prior history of osteomyelitis with amputation of the great toe. EXAM: MRI OF THE RIGHT FOREFOOT WITHOUT AND WITH CONTRAST TECHNIQUE: Multiplanar, multisequence MR imaging of the right foot was performed before and after the administration of intravenous contrast. CONTRAST:  8.5 cc Gadavist COMPARISON:  Radiographs 12/19/2018 FINDINGS: Prior amputation of the right great toe. The first metatarsal is unremarkable. No findings for fracture or osteomyelitis. There is diffuse T1 and T2 signal abnormality and contrast enhancement in the proximal and middle phalanges of the second toe consistent with osteomyelitis. There is also associated diffuse surrounding subcutaneous soft tissue swelling/edema, fluid and enhancement. I do not see a discrete drainable soft tissue abscess. The other bony structures are intact. No other areas of osteomyelitis are identified. There is diffuse cellulitis and myofasciitis but no findings for pyomyositis. IMPRESSION: 1. MR findings consistent with osteomyelitis involving the proximal and middle phalanges of the second toe. 2. Diffuse and marked soft tissue swelling/edema and fluid involving the second toe consistent with severe cellulitis. No discrete drainable soft tissue abscess. 3. Diffuse forefoot cellulitis and myofasciitis but no findings for pyomyositis. Electronically Signed   By: Rudie Meyer M.D.   On: 12/20/2018 07:57   Dg Foot Complete Right  Result Date: 12/19/2018 CLINICAL DATA:  Total infection EXAM: RIGHT FOOT COMPLETE - 3+ VIEW COMPARISON:  06/22/2017 FINDINGS: No acute displaced fracture or malalignment. Chronic fracture deformity distal fifth metatarsal. Status post amputation first digit at the MTP joint. No  periostitis or bone destruction. No soft tissue emphysema. IMPRESSION: No acute osseous abnormality. Electronically Signed   By: Jasmine Pang M.D.   On: 12/19/2018 19:51     ASSESSMENT AND PLAN:   73 year old female with history of CAD and diabetes who presented due to right second toe swelling and redness.  1. Osteomyelitis involving the proximal and middle phalanges of the second toe: Continue broad-spectrum antibiotics with vancomycin and cefepime. Patient was taken to the OR by podiatrist on 12/21/2018 for amputation of second and third toe. Patient reevaluated by podiatrist.  Wound inspected and incision intact.  Awaiting cultures from the bone.  Anticipate discharge in a few days on oral antibiotics once culture and sensitivities available.  Patient would likely benefit from rehab placement on discharge.  Physical therapy consulted by podiatrist.  With transfer at the bedside and bathroom privileges with postop shoe on ; appreciate input from podiatrist  2.  Diabetes: Blood sugars. controlled continue sliding scale, Lantus and ADA diet  3.  Essential hypertension: Blood pressure controlled.  Continue isosorbide, lisinopril, metoprolol  4.  Depression: Continue Effexor, Remeron, Xanax and trazodone  5.  Anemia of chronic disease: Continue ferrous sulfate  6.  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  Hyperlipidemia: Continue statin  8.  CAD: Continue aspirin, statin and metoprolol Stable  DVT prophylaxis; Lovenox    Management plans discussed with the patient and she is in agreement.  CODE STATUS: DNR  TOTAL TIME  TAKING CARE OF THIS PATIENT: 36 minutes.     POSSIBLE D/C 2-3 days to rehab depending on results of culture and sensitivities. Case manager to assist with placement on discharge.   Juanita Streight M.D on 12/22/2018 at 1:28 PM  Pager; 7 AM to 6 PM; [phone]  After 6pm go to [email or web address] - password [email or web address]  [facility]  Office   [phone]  CC: Primary care physician; Garlon Hatchet, MD  Note: This dictation was prepared with Dragon dictation along with smaller phrase technology. Any transcriptional errors that result from this process are unintentional.

## 2018-12-22 NOTE — Evaluation (Signed)
Physical Therapy Evaluation Patient Details Name: Jessica Donovan MRN: [id number] DOB: 1946/10/19 Today's Date: 12/22/2018   History of Present Illness  73 yo with osteomyelitis was referred to podiatrist to assess her R foot wounds.  Decision to amputate her 2nd and third toes on R foot on 12/21/18, now referred to PT for mobility with ortho shoe and minimal time wiht RLE dependent.  Pt has cellulitis and myofasciitis on R foot as well. PMHx:  PN, DM, anemia, CAD, HTN,   Clinical Impression  Pt was seen for mobility and with her recent osteomyelitis was given amputation to R foot second and third toes, resulting in decline in independence with gait and transfers.  Pt was living alone so will work toward more independence to allow her to get home from SNF faster.  Follow acutely for same.      Follow Up Recommendations SNF    Equipment Recommendations  None recommended by PT    Recommendations for Other Services       Precautions / Restrictions Precautions Precautions: Fall Precaution Comments: incontinent Required Braces or Orthoses: Other Brace Other Brace: R ortho shoe Restrictions Weight Bearing Restrictions: No Other Position/Activity Restrictions: cast shoe on RLE with gait      Mobility  Bed Mobility Overal bed mobility: Needs Assistance Bed Mobility: Supine to Sit;Sit to Supine     Supine to sit: Min assist Sit to supine: Min assist   General bed mobility comments: min assist to lift trunk and assist LE's and back to bed with min assist for trunk  Transfers Overall transfer level: Needs assistance Equipment used: Rolling walker (2 wheeled);1 person hand held assist Transfers: Sit to/from Stand Sit to Stand: Mod assist         General transfer comment: cued hand placement  Ambulation/Gait Ambulation/Gait assistance: Min assist Gait Distance (Feet): 10 Feet(5x2) Assistive device: Rolling walker (2 wheeled);1 person hand held assist Gait Pattern/deviations:  Step-to pattern;Decreased stride length;Wide base of support;Shuffle Gait velocity: reduced Gait velocity interpretation: <1.8 ft/sec, indicate of risk for recurrent falls General Gait Details: sidesteps with backward lean, unsteady and requires help to move walker  Stairs            Wheelchair Mobility    Modified Rankin (Stroke Patients Only)       Balance Overall balance assessment: Needs assistance Sitting-balance support: Bilateral upper extremity supported;Feet supported Sitting balance-Leahy Scale: Fair     Standing balance support: Bilateral upper extremity supported;During functional activity Standing balance-Leahy Scale: Poor                               Pertinent Vitals/Pain Pain Assessment: Faces Faces Pain Scale: Hurts a little bit Pain Location: R foot post op Pain Descriptors / Indicators: Operative site guarding Pain Intervention(s): Monitored during session;Repositioned    Home Living Family/patient expects to be discharged to:: Private residence Living Arrangements: Alone Available Help at Discharge: Family;Available PRN/intermittently Type of Home: House Home Access: Level entry     Home Layout: One level Home Equipment: Walker - 2 wheels;Walker - 4 wheels;Cane - single point Additional Comments: Pt requires 2 person assist to walk away from bed    Prior Function Level of Independence: Independent with assistive device(s)               Hand Dominance        Extremity/Trunk Assessment   Upper Extremity Assessment Upper Extremity Assessment: Overall WFL for tasks assessed  Lower Extremity Assessment Lower Extremity Assessment: Generalized weakness    Cervical / Trunk Assessment Cervical / Trunk Assessment: Kyphotic  Communication   Communication: No difficulties  Cognition Arousal/Alertness: Awake/alert Behavior During Therapy: Agitated Overall Cognitive Status: No family/caregiver present to determine  baseline cognitive functioning                                 General Comments: pt became emotional mult times over family issues and discussion she had about getting R foot amputation over dog bite      General Comments General comments (skin integrity, edema, etc.): skin open from surgical amputation of R foot second and third toes.     Exercises     Assessment/Plan    PT Assessment Patient needs continued PT services  PT Problem List Decreased strength;Decreased range of motion;Decreased activity tolerance;Decreased balance;Decreased mobility;Decreased coordination;Decreased cognition;Decreased knowledge of use of DME;Decreased safety awareness;Obesity;Decreased skin integrity;Pain       PT Treatment Interventions DME instruction;Gait training;Functional mobility training;Therapeutic activities;Therapeutic exercise;Balance training;Neuromuscular re-education;Patient/family education    PT Goals (Current goals can be found in the Care Plan section)  Acute Rehab PT Goals Patient Stated Goal: not a clearly stated goal PT Goal Formulation: With patient Time For Goal Achievement: 01/05/19 Potential to Achieve Goals: Fair    Frequency Min 2X/week   Barriers to discharge Decreased caregiver support home with independence    Co-evaluation               AM-PAC PT "6 Clicks" Mobility  Outcome Measure Help needed turning from your back to your side while in a flat bed without using bedrails?: A Lot Help needed moving from lying on your back to sitting on the side of a flat bed without using bedrails?: A Lot Help needed moving to and from a bed to a chair (including a wheelchair)?: A Lot Help needed standing up from a chair using your arms (e.g., wheelchair or bedside chair)?: Total Help needed to walk in hospital room?: Total Help needed climbing 3-5 steps with a railing? : Total 6 Click Score: 9    End of Session Equipment Utilized During Treatment: Gait  belt Activity Tolerance: Patient tolerated treatment well;Patient limited by fatigue Patient left: in bed;with call bell/phone within reach;with bed alarm set Nurse Communication: Mobility status;Other (comment)(talked with nursing about pt being wet from incontin ) PT Visit Diagnosis: Muscle weakness (generalized) (M62.81);Difficulty in walking, not elsewhere classified (R26.2);Pain Pain - Right/Left: Right Pain - part of body: Ankle and joints of foot    Time: 1430-1510 PT Time Calculation (min) (ACUTE ONLY): 40 min   Charges:   PT Evaluation $PT Eval Moderate Complexity: 1 Mod PT Treatments $Gait Training: 8-22 mins $Therapeutic Activity: 8-22 mins       Ivar Drape 12/22/2018, 3:44 PM   Samul Dada, PT MS Acute Rehab Dept. Number: [facility] [id number] and [facility] [phone]

## 2018-12-22 NOTE — Care Management Important Message (Signed)
Signed initial Medicare IM.  Blank copy left in room for reference.  Patient had questions concerning assistance for prescription and medical bill costs.  Given number for [facility] for information on available prescription assistance programs and number for Patient Financial Services for hospital bill questions.

## 2018-12-22 NOTE — Progress Notes (Addendum)
PT is recommending SNF. Clinical Child psychotherapistocial Worker (CSW) started [employer] [facility] authorization today. Plan is for patient to D[facility]C to [facility]. [facility] admissions coordinator at [facility] is aware of above. Patient is aware of above. Patient requested for CSW to call her medicaid worker Renda Revels [phone] to check on her medicaid application. CSW attempted to call however a voicemail could not be left. Patient reported that she needs to get on medicaid for drug coverage because sometimes she does not have enough food in the house. Per patient she is on the waiting list for meals on wheels. Patient reported that she has to choose between cable tv, medication and food sometimes because her social security check does not go a long way. Per patient she has also applied for food stamps. CSW gave patient [employer] information for the free daily meal and their food pantry. CSW provided emotional support. Patient thanked CSW for resources.    Baker Hughes IncorporatedBailey Raif Chachere, LCSW [phone]

## 2018-12-22 NOTE — Progress Notes (Signed)
[facility] Podiatry                                                      Patient Demographics  Shirell Struthers, is a 73 y.o. female   MRN: [id number]   DOB - 13-Aug-1946  Admit Date - 12/19/2018    Outpatient Primary MD for the patient is Garlon Hatchet, MD  Consult requested in the Hospital by Jama Flavors, MD, On 12/22/2018   With History of -  Past Medical History:  Diagnosis Date  . Anxiety   . Chronic back pain   . Collagen vascular disease (HCC)   . Coronary artery disease    a. s/p PCI/DES to dRCA & mLCx in 2014 with repeat LHC in 10/2015 showing patent stents  . Diabetes mellitus without complication (HCC)   . Hypertension   . Low back pain   . Myocardial infarction (HCC)   . Osteomyelitis of toe (HCC) 01/23/2016  . Stroke [facility])       Past Surgical History:  Procedure Laterality Date  . AMPUTATION TOE Left 11/10/2015   Procedure: AMPUTATION TOE;  Surgeon: Linus Galas, MD;  Location: [facility] ORS;  Service: Podiatry;  Laterality: Left;  . AMPUTATION TOE Left 01/24/2016   Procedure: AMPUTATION TOE (2nd mpj);  Surgeon: Linus Galas, DPM;  Location: [facility] ORS;  Service: Podiatry;  Laterality: Left;  . AMPUTATION TOE Right 12/21/2018   Procedure: 2ND TOE AMPUTATION WITH DEBRIDEMENT OF SOFT TISSUE;  Surgeon: Recardo Evangelist, DPM;  Location: [facility] ORS;  Service: Podiatry;  Laterality: Right;  . CARDIAC CATHETERIZATION N/A 11/12/2015   Procedure: Left Heart Cath and Coronary Angiography;  Surgeon: Marcina Millard, MD;  Location: [facility] INVASIVE CV LAB;  Service: Cardiovascular;  Laterality: N/A;  . COLONOSCOPY WITH PROPOFOL N/A 07/20/2017   Procedure: COLONOSCOPY WITH PROPOFOL;  Surgeon: Wyline Mood, MD;  Location: [facility] ENDOSCOPY;  Service: Endoscopy;  Laterality: N/A;  . JOINT REPLACEMENT    . TOE AMPUTATION      in for   Chief Complaint  Patient presents  with  . Wound Infection     HPI  Zoria Rawlinson  is a 73 y.o. female, 1 day status post amputation second toe right foot due to deformity and osteomyelitis infection   Social History   Tobacco Use  . Smoking status: Never Smoker  . Smokeless tobacco: Never Used  Substance Use Topics  . Alcohol use: No    Family History Family History  Problem Relation Age of Onset  . CAD Mother   . CAD Father     Prior to Admission medications   Medication Sig Start Date End Date Taking? Authorizing Provider  acetaminophen (TYLENOL) 500 MG tablet Take 1,000 mg by mouth every 8 (eight) hours as needed for mild pain or headache.    Yes [provider]  ALPRAZolam Prudy Feeler) 1 MG tablet Take 1 mg by mouth 3 (three) times daily.  02/08/17  Yes [provider]  aspirin EC 81 MG tablet Take 81 mg by mouth daily.   Yes [provider]  atorvastatin (LIPITOR) 20 MG tablet Take 20 mg by mouth daily.    Yes [provider]  Biotin 5 MG CAPS Take 5 mg by mouth daily.    Yes [provider]  celecoxib (CELEBREX) 200 MG capsule Take 200 mg by mouth  daily.    Yes [provider]  conjugated estrogens (PREMARIN) vaginal cream Place 1 Applicatorful vaginally daily. Use pea sized amount M-W-Fr before bedtime 11/29/18  Yes Vanna Scotland, MD  cyclobenzaprine (FLEXERIL) 10 MG tablet Take 1 tablet (10 mg total) by mouth 3 (three) times daily as needed for muscle spasms. 01/27/16  Yes Enid Baas, MD  diphenhydrAMINE (DIPHEN) 25 MG tablet Take 50 mg by mouth 2 (two) times daily as needed for itching, allergies or sleep.    Yes [provider]  diphenoxylate-atropine (LOMOTIL) 2.5-0.025 MG tablet Take 1 tablet by mouth every 4 (four) hours as needed for diarrhea or loose stools.    Yes [provider]  ferrous sulfate 325 (65 FE) MG tablet Take 325 mg by mouth daily with breakfast.   Yes [provider]  fesoterodine (TOVIAZ) 8 MG TB24  tablet Take 8 mg by mouth daily.   Yes [provider]  insulin aspart (NOVOLOG) 100 UNIT/ML injection Inject 3-15 Units into the skin 3 (three) times daily with meals as needed for high blood sugar. Pt uses as needed per sliding scale:    Less than 140:  0 units  140-180:  3 units 181-220:  4 units 221- 260:  6 units 261- 320:  8 units 321-360:  10 units 361-400:  12 units Greater than 400:  15 units   Yes [provider]  Insulin Glargine (TOUJEO SOLOSTAR) 300 UNIT/ML SOPN Inject 15 Units into the skin daily. Patient taking differently: Inject 15 Units into the skin daily. Patinet reports taking 50 units/day of Toujo if glucose > 130 and if glucose > 140 she takes Novolog SSI in addtiion to the Toujeo  - 09/30/18.  Checks glucose twice daily. 09/13/18  Yes Altamese Dilling, MD  isosorbide mononitrate (IMDUR) 30 MG 24 hr tablet Take 30 mg by mouth daily.    Yes [provider]  lisinopril (PRINIVIL,ZESTRIL) 40 MG tablet Take 1 tablet (40 mg total) by mouth daily. Patient taking differently: Take 20 mg by mouth 2 (two) times daily.  09/13/18  Yes Altamese Dilling, MD  metoprolol tartrate (LOPRESSOR) 25 MG tablet Take 25 mg by mouth 2 (two) times daily. 01/28/17  Yes [provider]  mirtazapine (REMERON) 15 MG tablet Take 15 mg by mouth at bedtime.   Yes [provider]  morphine (MS CONTIN) 15 MG 12 hr tablet Take 1 tablet (15 mg total) by mouth every 12 (twelve) hours. 09/13/18  Yes Altamese Dilling, MD  morphine (MSIR) 15 MG tablet Take 15 mg by mouth 3 (three) times daily.    Yes [provider]  Multiple Vitamin (MULTIVITAMIN WITH MINERALS) TABS tablet Take 1 tablet by mouth daily.   Yes [provider]  nitrofurantoin, macrocrystal-monohydrate, (MACROBID) 100 MG capsule Take 100 mg by mouth daily.   Yes [provider]  nitroGLYCERIN (NITROSTAT) 0.4 MG SL tablet Place 0.4 mg under the tongue every 5  (five) minutes as needed for chest pain.    Yes [provider]  omeprazole (PRILOSEC) 20 MG capsule Take 20 mg by mouth daily.   Yes [provider]  traZODone (DESYREL) 100 MG tablet Take 100 mg by mouth at bedtime. 01/28/17  Yes [provider]  venlafaxine (EFFEXOR) 75 MG tablet Take 75 mg by mouth 3 (three) times daily with meals.   Yes [provider]  vitamin B-12 (CYANOCOBALAMIN) 1000 MCG tablet Take 5,000 mcg by mouth daily.    Yes [provider]  Vitamin D, Ergocalciferol, (DRISDOL) 50000 units CAPS capsule Take 50,000 Units by mouth every Sunday.    Yes [provider]  polyethylene glycol powder (GLYCOLAX/MIRALAX) powder 17 grams as needed each time for constpation Patient not taking: Reported on 12/20/2018 06/24/17   Anna, Kiran, MD  pregabalin (LYRICA) 150 MG capsule Take 1 capsule (150 mg total) by mouth 2 (two) times daily. Patient not taking: Reported on 12/20/2018 01/19/17   Sudini, Srikar, MD    Anti-infectives (From admission, onward)   Start     Dose/Rate Route Frequency Ordered Stop   12/20/18 0500  vancomycin (VANCOCIN) IVPB 1000 mg/200 mL premix     1,000 mg 200 mL/hr over 60 Minutes Intravenous Every 12 hours 12/19/18 2151     12/19/18 2200  ceFEPIme (MAXIPIME) 2 g in sodium chloride 0.9 % 100 mL IVPB     2 g 200 mL/hr over 30 Minutes Intravenous Every 8 hours 12/19/18 2115     12 /30/19 2045  vancomycin (VANCOCIN) IVPB 1000 mg/200 mL premix     1,000 mg 200 mL/hr over 60 Minutes Intravenous  Once 12/19/18 2037 12/20/18 0104      Scheduled Meds: . ALPRAZolam  1 mg Oral TID  . aspirin EC  81 mg Oral Daily  . atorvastatin  20 mg Oral Daily  . celecoxib  200 mg Oral Daily  . conjugated estrogens  1 Applicatorful Vaginal Q M,W,F-2000  . enoxaparin (LOVENOX) injection  40 mg Subcutaneous Q24H  . ferrous sulfate  325 mg Oral Q breakfast  . fesoterodine  8 mg Oral Daily  . insulin aspart  0-9 Units Subcutaneous TID  AC & HS  . insulin aspart  5 Units Subcutaneous TID AC  . insulin glargine  15 Units Subcutaneous Daily  . isosorbide mononitrate  30 mg Oral Daily  . lisinopril  40 mg Oral Daily  . metoprolol tartrate  25 mg Oral BID  . mirtazapine  15 mg Oral QHS  . morphine  15 mg Oral Q12H  . morphine  15 mg Oral Q8H  . pantoprazole  40 mg Oral Daily  . pregabalin  150 mg Oral BID  . sodium chloride flush  3 mL Intravenous Q12H  . traZODone  100 mg Oral QHS  . venlafaxine  75 mg Oral TID WC  . vitamin B-12  5,000 mcg Oral Daily   Continuous Infusions: . sodium chloride 250 mL (12/21/18 1409)  . ceFEPime (MAXIPIME) IV Stopped (12/22/18 0556)  . vancomycin 1,000 mg (12/22/18 0558)   PRN Meds:.sodium chloride, acetaminophen **OR** acetaminophen, acetaminophen, cyclobenzaprine, diphenoxylate-atropine, nitroGLYCERIN, ondansetron **OR** ondansetron (ZOFRAN) IV, sodium chloride flush  Allergies  Allergen Reactions  . Bactrim [Sulfamethoxazole-Trimethoprim]   . Codeine Nausea And Vomiting  . Flu Virus Vaccine     Other reaction(s): Other (See Comments) Passed out for 12 days  . Influenza Vaccines Other (See Comments)    Reaction:  Caused pt to pass out   . Methadone Hives and Itching  . Oxycodone-Acetaminophen Nausea And Vomiting  . Percocet [Oxycodone-Acetaminophen] Nausea And Vomiting  . Sulfa Antibiotics   . Tetanus Antitoxin   . Tetanus Toxoids Swelling and Other (See Comments)    Reaction:  Swelling at injection site  . Penicillin G Rash    Has patient had a PCN reaction causing immediate rash, facial/tongue/throat swelling, SOB or lightheadedness with hypotension: Yes Has patient had a PCN reaction causing severe rash involving mucus membranes or skin necrosis: No Has patient had a PCN reaction that required  hospitalization: No Has patient had a PCN reaction occurring within the last 10 years: No If all of the above answers are "NO", then may proceed with Cephalosporin use..  .  Tetracyclines & Related Rash    Physical Exam  Vitals  Blood pressure 122/89, pulse 85, temperature 98.4 F (36.9 C), temperature source Oral, resp. rate 16, height 5\' 7"  (1.702 m), weight 82.2 kg, SpO2 94 %.  Lower Extremity exam:  CBC Recent Labs  Lab 12/19/18 1638 12/20/18 0330 12/21/18 0310 12/22/18 0423  WBC 10.9* 7.9 7.1 8.4  HGB 12.4 11.1* 11.3* 11.6*  HCT 38.7 35.2* 36.0 36.8  PLT 394 334 347 357  MCV 100.0 100.6* 102.3* 101.4*  MCH 32.0 31.7 32.1 32.0  MCHC 32.0 31.5 31.4 31.5  RDW 12.4 12.6 12.5 12.8  LYMPHSABS 1.6  --   --   --   MONOABS 1.0  --   --   --   EOSABS 0.1  --   --   --   BASOSABS 0.0  --   --   --    ------------------------------------------------------------------------------------------------------------------  Chemistries  Recent Labs  Lab 12/19/18 1638 12/20/18 0330 12/21/18 0310 12/22/18 0423  NA 135 138 137 138  K 4.0 4.2 4.1 3.8  CL 99 103 100 102  CO2 24 28 28 28   GLUCOSE 235* 204* 157* 148*  BUN 19 15 13 12   CREATININE 0.73 0.53 0.61 0.62  CALCIUM 9.4 9.1 9.2 9.0  MG  --   --   --  1.9  AST 28  --   --   --   ALT 16  --   --   --   ALKPHOS 78  --   --   --   BILITOT 0.3  --   --   --    ----------Assessment & Plan: Dressing change today and wound inspected incision margins intact and very stable.  The cellulitis is improved considerably that was proximal to the toe on top of the foot the third toe also looks better less erythematous and red-white count is down to normal limits again.  Overall I think she is stable and progressing.  We are waiting cultures from the bone but I think that should be able to be discharged in the next couple days with oral antibiotics.  She has expressed a concern about going home because she lives by herself and is interested in perhaps doing a rehab for a couple of weeks but I explained to her that Medicare has certain criteria and I will ask care management to talk to her regarding that.  I change  the dressing today and will let her start to get physical therapy for transfer to a bedside chair and also give her bathroom privileges with her postop shoe on.  Active Problems:   Right foot infection   Family Communication: Plan discussed with patient   Recardo EvangelistMatthew Jamar Casagrande M.D on 12/22/2018 at 10:31 AM  Thank you for the consult, we will follow the patient with you in the Hospital.

## 2018-12-23 DIAGNOSIS — R1312 Dysphagia, oropharyngeal phase: Secondary | ICD-10-CM | POA: Diagnosis not present

## 2018-12-23 DIAGNOSIS — F339 Major depressive disorder, recurrent, unspecified: Secondary | ICD-10-CM | POA: Diagnosis not present

## 2018-12-23 DIAGNOSIS — M6281 Muscle weakness (generalized): Secondary | ICD-10-CM | POA: Diagnosis not present

## 2018-12-23 DIAGNOSIS — G9009 Other idiopathic peripheral autonomic neuropathy: Secondary | ICD-10-CM | POA: Diagnosis not present

## 2018-12-23 DIAGNOSIS — F419 Anxiety disorder, unspecified: Secondary | ICD-10-CM | POA: Diagnosis not present

## 2018-12-23 DIAGNOSIS — E119 Type 2 diabetes mellitus without complications: Secondary | ICD-10-CM | POA: Diagnosis not present

## 2018-12-23 DIAGNOSIS — Z7401 Bed confinement status: Secondary | ICD-10-CM | POA: Diagnosis not present

## 2018-12-23 DIAGNOSIS — M2550 Pain in unspecified joint: Secondary | ICD-10-CM | POA: Diagnosis not present

## 2018-12-23 DIAGNOSIS — R52 Pain, unspecified: Secondary | ICD-10-CM | POA: Diagnosis not present

## 2018-12-23 DIAGNOSIS — F064 Anxiety disorder due to known physiological condition: Secondary | ICD-10-CM | POA: Diagnosis not present

## 2018-12-23 DIAGNOSIS — M86171 Other acute osteomyelitis, right ankle and foot: Secondary | ICD-10-CM | POA: Diagnosis not present

## 2018-12-23 DIAGNOSIS — I739 Peripheral vascular disease, unspecified: Secondary | ICD-10-CM | POA: Diagnosis not present

## 2018-12-23 DIAGNOSIS — I10 Essential (primary) hypertension: Secondary | ICD-10-CM | POA: Diagnosis not present

## 2018-12-23 DIAGNOSIS — M6282 Rhabdomyolysis: Secondary | ICD-10-CM | POA: Diagnosis not present

## 2018-12-23 DIAGNOSIS — K219 Gastro-esophageal reflux disease without esophagitis: Secondary | ICD-10-CM | POA: Diagnosis not present

## 2018-12-23 DIAGNOSIS — D649 Anemia, unspecified: Secondary | ICD-10-CM | POA: Diagnosis not present

## 2018-12-23 DIAGNOSIS — L089 Local infection of the skin and subcutaneous tissue, unspecified: Secondary | ICD-10-CM | POA: Diagnosis not present

## 2018-12-23 DIAGNOSIS — G8929 Other chronic pain: Secondary | ICD-10-CM | POA: Diagnosis not present

## 2018-12-23 DIAGNOSIS — E785 Hyperlipidemia, unspecified: Secondary | ICD-10-CM | POA: Diagnosis not present

## 2018-12-23 LAB — BASIC METABOLIC PANEL
Anion gap: 6 (ref 5–15)
BUN: 13 mg/dL (ref 8–23)
CO2: 29 mmol/L (ref 22–32)
Calcium: 9 mg/dL (ref 8.9–10.3)
Chloride: 102 mmol/L (ref 98–111)
Creatinine, Ser: 0.63 mg/dL (ref 0.44–1.00)
GFR calc Af Amer: >60 mL/min (ref >60)
GFR calc non Af Amer: >60 mL/min (ref >60)
GLUCOSE: 250 mg/dL — AB (ref 70–99)
Potassium: 4.1 mmol/L (ref 3.5–5.1)
Sodium: 137 mmol/L (ref 135–145)

## 2018-12-23 LAB — GLUCOSE, CAPILLARY
Glucose-Capillary: 160 mg/dL — ABNORMAL HIGH (ref 70–99)
Glucose-Capillary: 190 mg/dL — ABNORMAL HIGH (ref 70–99)
Glucose-Capillary: 324 mg/dL — ABNORMAL HIGH (ref 70–99)

## 2018-12-23 LAB — CBC
HEMATOCRIT: 36.5 % (ref 36.0–46.0)
Hemoglobin: 11.4 g/dL — ABNORMAL LOW (ref 12.0–15.0)
MCH: 32 pg (ref 26.0–34.0)
MCHC: 31.2 g/dL (ref 30.0–36.0)
MCV: 102.5 fL — AB (ref 80.0–100.0)
Platelets: 330 10*3/uL (ref 150–400)
RBC: 3.56 MIL/uL — ABNORMAL LOW (ref 3.87–5.11)
RDW: 12.6 % (ref 11.5–15.5)
WBC: 7.7 10*3/uL (ref 4.0–10.5)
nRBC: 0 % (ref 0.0–0.2)

## 2018-12-23 LAB — MAGNESIUM: Magnesium: 2.1 mg/dL (ref 1.7–2.4)

## 2018-12-23 LAB — SURGICAL PATHOLOGY

## 2018-12-23 MED ORDER — ALPRAZOLAM 1 MG PO TABS: 1.0000 mg | ORAL_TABLET | Freq: Three times a day (TID) | ORAL | 0 refills | Status: DC

## 2018-12-23 MED ORDER — LEVOFLOXACIN 500 MG PO TABS: 500.0000 mg | ORAL_TABLET | Freq: Every day | ORAL | 0 refills | Status: AC

## 2018-12-23 MED ORDER — MORPHINE SULFATE 15 MG PO TABS: 15.0000 mg | ORAL_TABLET | Freq: Three times a day (TID) | ORAL | 0 refills | Status: DC

## 2018-12-23 MED ORDER — MORPHINE SULFATE ER 15 MG PO TBCR: 15.0000 mg | EXTENDED_RELEASE_TABLET | Freq: Two times a day (BID) | ORAL | 0 refills | Status: DC

## 2018-12-23 NOTE — Progress Notes (Signed)
Report called and given to Nicole at [facility].  

## 2018-12-23 NOTE — Progress Notes (Signed)
Patient is medically stable for D/C to [facility] today. [facility] authorization has been received for 7 days, authorization # [id number]. Per Tammy [facility] admissions coordinator patient can come today to room 808. RN will call report and arrange EMS for transport. Clinical Child psychotherapist (CSW) sent D/C orders to [facility] via [facility]. Patient is aware of above. Patient is also aware that she will have a co-pay $10-$20 per day. Patient was on the phone with her friend Tobi Bastos while CSW was in the room and made her aware of D/C today. Please reconsult if future social work needs arise. CSW signing off.   [employer], LCSW [phone]

## 2018-12-23 NOTE — Progress Notes (Addendum)
Pharmacy Antibiotic Note  Jessica Donovan is a 73 y.o. female admitted on 12/19/2018 with a diabetic foot infection.  Pharmacy has been consulted for Cefepime and vancomycin dosing. Patient has a history of osteomyelitis requiring toe amputation in the past. Vancomycin 1g IV ordered in ED.   Plan: Vancomycin 1000 IV every 12 hours with 6 hour stack dosing.  Goal trough 15-20 mcg/mL. Will continue current regimen. Trough level returned at 15 (level drawn 11 hours post dose). Will continue to monitor and order a lab in 4-5 days or earlier if clinically necessary.  Trough to be drawn 1/5 @ 0430 to reassess.  Continue cefepime 2g every 8 hours  Height: 5\' 7"  (170.2 cm) Weight: 181 lb 3.5 oz (82.2 kg) IBW/kg (Calculated) : 61.6  Temp (24hrs), Avg:98.5 F (36.9 C), Min:98.4 F (36.9 C), Max:98.6 F (37 C)  Recent Labs  Lab 12/19/18 1637 12/19/18 1638 12/19/18 1918 12/20/18 0330 12/21/18 0310 12/21/18 1638 12/22/18 0423 12/23/18 0502  WBC  --  10.9*  --  7.9 7.1  --  8.4 7.7  CREATININE  --  0.73  --  0.53 0.61  --  0.62 0.63  LATICACIDVEN 2.2*  --  1.8  --   --   --   --   --   VANCOTROUGH  --   --   --   --   --  15  --   --     Estimated Creatinine Clearance: 70 mL/min (by C-G formula based on SCr of 0.63 mg/dL).    Allergies  Allergen Reactions  . Bactrim [Sulfamethoxazole-Trimethoprim]   . Codeine Nausea And Vomiting  . Flu Virus Vaccine     Other reaction(s): Other (See Comments) Passed out for 12 days  . Influenza Vaccines Other (See Comments)    Reaction:  Caused pt to pass out   . Methadone Hives and Itching  . Oxycodone-Acetaminophen Nausea And Vomiting  . Percocet [Oxycodone-Acetaminophen] Nausea And Vomiting  . Sulfa Antibiotics   . Tetanus Antitoxin   . Tetanus Toxoids Swelling and Other (See Comments)    Reaction:  Swelling at injection site  . Penicillin G Rash    Has patient had a PCN reaction causing immediate rash, facial/tongue/throat swelling, SOB or  lightheadedness with hypotension: Yes Has patient had a PCN reaction causing severe rash involving mucus membranes or skin necrosis: No Has patient had a PCN reaction that required hospitalization: No Has patient had a PCN reaction occurring within the last 10 years: No If all of the above answers are "NO", then may proceed with Cephalosporin use..  . Tetracyclines & Related Rash    Antimicrobials this admission: 12/30 Vancomycin  >>  12/30 cefepime >>    Thank you for allowing pharmacy to be a part of this patient's care.  Orinda Kenner, PharmD Clinical Pharmacist 12/23/2018 7:35 AM

## 2018-12-23 NOTE — Progress Notes (Addendum)
Inpatient Diabetes Program Recommendations  AACE/ADA: New Consensus Statement on Inpatient Glycemic Control (2019)  Target Ranges:  Prepandial:   less than 140 mg/dL      [facility] postprandial:   less than 180 mg/dL (1-2 hours)      Critically ill patients:  140 - 180 mg/dL  Results for Jessica Donovan, Jessica Donovan (MRN [id number]) as of 12/23/2018 07:32  Ref. Range 12/22/2018 08:19 12/22/2018 12:06 12/22/2018 16:51 12/22/2018 21:10 12/23/2018 02:58  Glucose-Capillary Latest Ref Range: 70 - 99 mg/dL 093 (H) 267 (H) 124 (H) 145 (H) 160 (H)   Results for Jessica Donovan, Jessica Donovan (MRN [id number]) as of 12/23/2018 07:32  Ref. Range 09/11/2018 04:20 12/20/2018 17:28  Hemoglobin A1C Latest Ref Range: 4.8 - 5.6 % 7.4 (H) 7.8 (H)   Review of Glycemic Control  Diabetes history: DM2 Outpatient Diabetes medications: Toujeo 15-50 units daily (prescribed 15 units daily; per home med list pt reports taking 50 units if glucose >130 mg/dl), Novolog 2-50 units TID with meals Current orders for Inpatient glycemic control: Lantus 15 units daily, Novolog 0-9 units AC&HS, Novolog 5 units TID with meals   Inpatient Recommendations:  Insulin-Meal Coverage: Post prandial glucose continues to be consistently elevated.  Please consider increasing meal coverage to Novolog 8 units TID with meals.  Thanks, Orlando Penner, RN, MSN, CDE Diabetes Coordinator Inpatient Diabetes Program [phone] (Team Pager from 8am to 5pm)

## 2018-12-23 NOTE — Clinical Social Work Placement (Signed)
   CLINICAL SOCIAL WORK PLACEMENT  NOTE  Date:  12/23/2018  Patient Details  Name: Jessica Donovan MRN: [id number] Date of Birth: [birth year]  Clinical Social Work is seeking post-discharge placement for this patient at the Skilled  Nursing Facility level of care (*CSW will initial, date and re-position this form in  chart as items are completed):  Yes   Patient/family provided with [facility] Clinical Social Work Department's list of facilities offering this level of care within the geographic area requested by the patient (or if unable, by the patient's family).  Yes   Patient/family informed of their freedom to choose among providers that offer the needed level of care, that participate in Medicare, Medicaid or managed care program needed by the patient, have an available bed and are willing to accept the patient.  Yes   Patient/family informed of [facility]'s ownership interest [facility] and [facility], as well as of the fact that they are under no obligation to receive care at these facilities.  PASRR submitted to EDS on       PASRR number received on       Existing PASRR number confirmed on 12/20/18     FL2 transmitted to all facilities in geographic area requested by pt/family on 12/20/18     FL2 transmitted to all facilities within larger geographic area on       Patient informed that his/her managed care company has contracts with or will negotiate with certain facilities, including the following:        Yes   Patient/family informed of bed offers received.  Patient chooses bed at ([facility] )     Physician recommends and patient chooses bed at      Patient to be transferred to ([facility] ) on 12/23/18.  Patient to be transferred to facility by [facility] )     Patient family notified on 12/23/18 of transfer.  Name of family member notified:  (Patient's friend [name] is aware of D/C today. )     PHYSICIAN       Additional Comment:     _______________________________________________ Erykah Lippert, [name], LCSW 12/23/2018, 2:08 PM

## 2018-12-23 NOTE — Progress Notes (Addendum)
EMS called for transport. IV removed. Pt dressed awaiting transport.

## 2018-12-23 NOTE — Progress Notes (Signed)
[facility]                                                      Patient Demographics  Jessica Donovan, is a 73 y.o. female   MRN: [id number]   DOB - May 29, 1946  Admit Date - 12/19/2018    Outpatient Primary MD for the patient is Garlon Hatchet, MD  Consult requested in the Hospital by Jama Flavors, MD, On 12/23/2018  With History of -  Past Medical History:  Diagnosis Date  . Anxiety   . Chronic back pain   . Collagen vascular disease (HCC)   . Coronary artery disease    a. s/p PCI/DES to dRCA & mLCx in 2014 with repeat LHC in 10/2015 showing patent stents  . Diabetes mellitus without complication (HCC)   . Hypertension   . Low back pain   . Myocardial infarction (HCC)   . Osteomyelitis of toe (HCC) 01/23/2016  . Stroke [facility])       Past Surgical History:  Procedure Laterality Date  . AMPUTATION TOE Left 11/10/2015   Procedure: AMPUTATION TOE;  Surgeon: Linus Galas, MD;  Location: [facility] ORS;  Service: Podiatry;  Laterality: Left;  . AMPUTATION TOE Left 01/24/2016   Procedure: AMPUTATION TOE (2nd mpj);  Surgeon: Linus Galas, DPM;  Location: [facility] ORS;  Service: Podiatry;  Laterality: Left;  . AMPUTATION TOE Right 12/21/2018   Procedure: 2ND TOE AMPUTATION WITH DEBRIDEMENT OF SOFT TISSUE;  Surgeon: Recardo Evangelist, DPM;  Location: [facility] ORS;  Service: Podiatry;  Laterality: Right;  . CARDIAC CATHETERIZATION N/A 11/12/2015   Procedure: Left Heart Cath and Coronary Angiography;  Surgeon: Marcina Millard, MD;  Location: [facility] INVASIVE CV LAB;  Service: Cardiovascular;  Laterality: N/A;  . COLONOSCOPY WITH PROPOFOL N/A 07/20/2017   Procedure: COLONOSCOPY WITH PROPOFOL;  Surgeon: Wyline Mood, MD;  Location: [facility] ENDOSCOPY;  Service: Endoscopy;  Laterality: N/A;  . JOINT REPLACEMENT    . TOE AMPUTATION      in for   Chief Complaint  Patient presents with   . Wound Infection     HPI  Jessica Donovan  is a 73 y.o. female, 2 days status post amputation to the second toe of the right foot due to osteomyelitis infection.  Change dressing yesterday incision looked excellent.  Cellulitis redness was significantly improved.   Social History Social History   Tobacco Use  . Smoking status: Never Smoker  . Smokeless tobacco: Never Used  Substance Use Topics  . Alcohol use: No    Family History Family History  Problem Relation Age of Onset  . CAD Mother   . CAD Father     Prior to Admission medications   Medication Sig Start Date End Date Taking? Authorizing Provider  acetaminophen (TYLENOL) 500 MG tablet Take 1,000 mg by mouth every 8 (eight) hours as needed for mild pain or headache.    Yes [provider]  ALPRAZolam Prudy Feeler) 1 MG tablet Take 1 mg by mouth 3 (three) times daily.  02/08/17  Yes [provider]  aspirin EC 81 MG tablet Take 81 mg by mouth daily.   Yes [provider]  atorvastatin (LIPITOR) 20 MG tablet Take 20 mg by mouth daily.    Yes [provider]  Biotin 5 MG CAPS Take 5 mg by mouth daily.  Yes [provider]  celecoxib (CELEBREX) 200 MG capsule Take 200 mg by mouth daily.    Yes [provider]  conjugated estrogens (PREMARIN) vaginal cream Place 1 Applicatorful vaginally daily. Use pea sized amount M-W-Fr before bedtime 11/29/18  Yes Vanna ScotlandBrandon, Ashley, MD  cyclobenzaprine (FLEXERIL) 10 MG tablet Take 1 tablet (10 mg total) by mouth 3 (three) times daily as needed for muscle spasms. 01/27/16  Yes Enid BaasKalisetti, Radhika, MD  diphenhydrAMINE (DIPHEN) 25 MG tablet Take 50 mg by mouth 2 (two) times daily as needed for itching, allergies or sleep.    Yes [provider]  diphenoxylate-atropine (LOMOTIL) 2.5-0.025 MG tablet Take 1 tablet by mouth every 4 (four) hours as needed for diarrhea or loose stools.    Yes [provider]  ferrous sulfate 325 (65 FE) MG  tablet Take 325 mg by mouth daily with breakfast.   Yes [provider]  fesoterodine (TOVIAZ) 8 MG TB24 tablet Take 8 mg by mouth daily.   Yes [provider]  insulin aspart (NOVOLOG) 100 UNIT/ML injection Inject 3-15 Units into the skin 3 (three) times daily with meals as needed for high blood sugar. Pt uses as needed per sliding scale:    Less than 140:  0 units  140-180:  3 units 181-220:  4 units 221- 260:  6 units 261- 320:  8 units 321-360:  10 units 361-400:  12 units Greater than 400:  15 units   Yes [provider]  Insulin Glargine (TOUJEO SOLOSTAR) 300 UNIT/ML SOPN Inject 15 Units into the skin daily. Patient taking differently: Inject 15 Units into the skin daily. Patinet reports taking 50 units/day of Toujo if glucose > 130 and if glucose > 140 she takes Novolog SSI in addtiion to the Toujeo  - 09/30/18.  Checks glucose twice daily. 09/13/18  Yes Altamese DillingVachhani, Vaibhavkumar, MD  isosorbide mononitrate (IMDUR) 30 MG 24 hr tablet Take 30 mg by mouth daily.    Yes [provider]  lisinopril (PRINIVIL,ZESTRIL) 40 MG tablet Take 1 tablet (40 mg total) by mouth daily. Patient taking differently: Take 20 mg by mouth 2 (two) times daily.  09/13/18  Yes Altamese DillingVachhani, Vaibhavkumar, MD  metoprolol tartrate (LOPRESSOR) 25 MG tablet Take 25 mg by mouth 2 (two) times daily. 01/28/17  Yes [provider]  mirtazapine (REMERON) 15 MG tablet Take 15 mg by mouth at bedtime.   Yes [provider]  morphine (MS CONTIN) 15 MG 12 hr tablet Take 1 tablet (15 mg total) by mouth every 12 (twelve) hours. 09/13/18  Yes Altamese DillingVachhani, Vaibhavkumar, MD  morphine (MSIR) 15 MG tablet Take 15 mg by mouth 3 (three) times daily.    Yes [provider]  Multiple Vitamin (MULTIVITAMIN WITH MINERALS) TABS tablet Take 1 tablet by mouth daily.   Yes [provider]  nitrofurantoin, macrocrystal-monohydrate, (MACROBID) 100 MG capsule Take 100 mg by mouth daily.    Yes [provider]  nitroGLYCERIN (NITROSTAT) 0.4 MG SL tablet Place 0.4 mg under the tongue every 5 (five) minutes as needed for chest pain.    Yes [provider]  omeprazole (PRILOSEC) 20 MG capsule Take 20 mg by mouth daily.   Yes [provider]  traZODone (DESYREL) 100 MG tablet Take 100 mg by mouth at bedtime. 01/28/17  Yes [provider]  venlafaxine (EFFEXOR) 75 MG tablet Take 75 mg by mouth 3 (three) times daily with meals.   Yes [provider]  vitamin B-12 (CYANOCOBALAMIN) 1000  MCG tablet Take 5,000 mcg by mouth daily.    Yes [provider]  Vitamin D, Ergocalciferol, (DRISDOL) 50000 units CAPS capsule Take 50,000 Units by mouth every Sunday.    Yes [provider]  polyethylene glycol powder (GLYCOLAX/MIRALAX) powder 17 grams as needed each time for constpation Patient not taking: Reported on 12/20/2018 06/24/17   Anna, Kiran, MD  pregabalin (LYRICA) 150 MG capsule Take 1 capsule (150 mg total) by mouth 2 (two) times daily. Patient not taking: Reported on 12/20/2018 01/19/17   Sudini, Srikar, MD    Anti-infectives (From admission, onward)   Start     Dose/Rate Route Frequency Ordered Stop   12/20/18 0500  vancomycin (VANCOCIN) IVPB 1000 mg/200 mL premix     1,000 mg 200 mL/hr over 60 Minutes Intravenous Every 12 hours 12/19/18 2151     12/19/18 2200  ceFEPIme (MAXIPIME) 2 g in sodium chloride 0.9 % 100 mL IVPB     2 g 200 mL/hr over 30 Minutes Intravenous Every 8 hours 12/19/18 2115     12 /30/19 2045  vancomycin (VANCOCIN) IVPB 1000 mg/200 mL premix     1,000 mg 200 mL/hr over 60 Minutes Intravenous  Once 12/19/18 2037 12/20/18 0104      Scheduled Meds: . ALPRAZolam  1 mg Oral TID  . aspirin EC  81 mg Oral Daily  . atorvastatin  20 mg Oral Daily  . celecoxib  200 mg Oral Daily  . conjugated estrogens  1 Applicatorful Vaginal Q M,W,F-2000  . enoxaparin (LOVENOX) injection  40 mg Subcutaneous Q24H  . ferrous  sulfate  325 mg Oral Q breakfast  . fesoterodine  8 mg Oral Daily  . insulin aspart  0-9 Units Subcutaneous TID AC & HS  . insulin aspart  5 Units Subcutaneous TID AC  . insulin glargine  15 Units Subcutaneous Daily  . isosorbide mononitrate  30 mg Oral Daily  . lisinopril  40 mg Oral Daily  . metoprolol tartrate  25 mg Oral BID  . mirtazapine  15 mg Oral QHS  . morphine  15 mg Oral Q12H  . morphine  15 mg Oral Q8H  . pantoprazole  40 mg Oral Daily  . pregabalin  150 mg Oral BID  . sodium chloride flush  3 mL Intravenous Q12H  . traZODone  100 mg Oral QHS  . venlafaxine  75 mg Oral TID WC  . vitamin B-12  5,000 mcg Oral Daily   Continuous Infusions: . sodium chloride 250 mL (12/21/18 1409)  . ceFEPime (MAXIPIME) IV 2 g (12/23/18 0537)  . vancomycin 1,000 mg (12/23/18 0428)   PRN Meds:.sodium chloride, acetaminophen **OR** acetaminophen, acetaminophen, cyclobenzaprine, diphenoxylate-atropine, nitroGLYCERIN, ondansetron **OR** ondansetron (ZOFRAN) IV, polyvinyl alcohol, sodium chloride flush  Allergies  Allergen Reactions  . Bactrim [Sulfamethoxazole-Trimethoprim]   . Codeine Nausea And Vomiting  . Flu Virus Vaccine     Other reaction(s): Other (See Comments) Passed out for 12 days  . Influenza Vaccines Other (See Comments)    Reaction:  Caused pt to pass out   . Methadone Hives and Itching  . Oxycodone-Acetaminophen Nausea And Vomiting  . Percocet [Oxycodone-Acetaminophen] Nausea And Vomiting  . Sulfa Antibiotics   . Tetanus Antitoxin   . Tetanus Toxoids Swelling and Other (See Comments)    Reaction:  Swelling at injection site  . Penicillin G Rash    Has patient had a PCN reaction causing immediate rash, facial/tongue/throat swelling, SOB or lightheadedness with hypotension: Yes Has patient had a PCN  reaction causing severe rash involving mucus membranes or skin necrosis: No Has patient had a PCN reaction that required hospitalization: No Has patient had a PCN reaction  occurring within the last 10 years: No If all of the above answers are "NO", then may proceed with Cephalosporin use..  . Tetracyclines & Related Rash    Physical Exam  Vitals  Blood pressure 130/72, pulse 78, temperature 98.1 F (36.7 C), resp. rate 17, height 5\' 7"  (1.702 m), weight 82.2 kg, SpO2 94 %.  Lower Extremity exam: Patient stable with her foot she is able to get up and ambulate to the bathroom and physical therapy helped work with her.  Dressings clean dry and intact.  Has a postop shoe that she is utilizing as well.   Data Review  CBC Recent Labs  Lab 12/19/18 1638 12/20/18 0330 12/21/18 0310 12/22/18 0423 12/23/18 0502  WBC 10.9* 7.9 7.1 8.4 7.7  HGB 12.4 11.1* 11.3* 11.6* 11.4*  HCT 38.7 35.2* 36.0 36.8 36.5  PLT 394 334 347 357 330  MCV 100.0 100.6* 102.3* 101.4* 102.5*  MCH 32.0 31.7 32.1 32.0 32.0  MCHC 32.0 31.5 31.4 31.5 31.2  RDW 12.4 12.6 12.5 12.8 12.6  LYMPHSABS 1.6  --   --   --   --   MONOABS 1.0  --   --   --   --   EOSABS 0.1  --   --   --   --   BASOSABS 0.0  --   --   --   --    ------------------------------------------------------------------------------------------------------------------  Assessment & Plan: Overall the patient is doing very well her white count is normal her foot look great yesterday.Leave the dressing dry clean and intact.  She is scheduled to be transferred to [facility] today.  We will have her continue with just bathroom privileges not too much over ambulation at this point.  She can have bathroom privileges and also move from bed to bedside chair.  Needs to use the walker in her postop shoe whenever ambulating.  Take antibiotics as prescribed.  Patient needs appointment to come back to my office on Wednesday of next week.  That will be January 8.  Active Problems:   Right foot infection   Family Communication: Plan discussed with patient   Recardo EvangelistMatthew Reggie Welge M.D on 12/23/2018 at 10:00 AM  Thank you for the  consult, we will follow the patient with you in the Hospital.

## 2018-12-23 NOTE — Discharge Summary (Signed)
[facility] [employer] [facility] [place] at [facility]   PATIENT NAME: Jessica Donovan    MR#:  [id number]  DATE OF BIRTH:  21-Aug-1946  DATE OF ADMISSION:  12/19/2018   ADMITTING PHYSICIAN: Auburn Bilberry, MD  DATE OF DISCHARGE: 12/23/2018  PRIMARY CARE PHYSICIAN: Garlon Hatchet, MD   ADMISSION DIAGNOSIS:  Right foot infection [L08.9] DISCHARGE DIAGNOSIS:  Active Problems:   Right foot infection  SECONDARY DIAGNOSIS:   Past Medical History:  Diagnosis Date  . Anxiety   . Chronic back pain   . Collagen vascular disease (HCC)   . Coronary artery disease    a. s/p PCI/DES to dRCA & mLCx in 2014 with repeat LHC in 10/2015 showing patent stents  . Diabetes mellitus without complication (HCC)   . Hypertension   . Low back pain   . Myocardial infarction (HCC)   . Osteomyelitis of toe (HCC) 01/23/2016  . Stroke [facility])    HOSPITAL COURSE:   HISTORY OF PRESENT ILLNESS: Jessica Donovan  is a 73 y.o. female with a known history of chronic back pain, coronary artery disease, diabetes type 2, hypertension, history of osteomyelitis, previous stroke who is presented with right second toe with swelling and redness.  Patient also has redness involving the foot.  Patient has previous osteomyelitis in her toes requiring amputation in the past.  Patient has had fevers and chills.  Patient was admitted to medical service for further evaluation.  Please refer to the H&P dictated for further details.   HOSPITAL COURSE; 1. Osteomyelitis involving the proximal and middle phalanges of the second toe: During the course of this admission patient was treated with IV antibiotics with cefepime and vancomycin.Patient was taken to the OR by podiatrist on 12/21/2018 for amputation of second Patient reevaluated by podiatrist.  Wound inspected and incision intact.  Patient cleared for discharge to skilled nursing facility by podiatrist today.  I discussed the case with him and he stated he got out already  infected digit.  Recommended oral antibiotics and with plans to follow-up with patient on December 28, 2018.  Cultures currently not yet available but patient cleared for discharge by podiatrist.  Unable to Augmentin since patient is allergic to penicillins.  Discharged on p.o. levofloxacin for at least 7 days.  Podiatrist to follow-up with final culture and sensitivities on follow-up with him in the office on Wednesday.  Recommendations from podiatrist Leave the dressing dry clean and intact.  She is  to be transferred to [facility] .    Continue with just bathroom privileges not too much over ambulation at this point.  She can have bathroom privileges and also move from bed to bedside chair.  Needs to use the walker in her postop shoe whenever ambulating.  Take antibiotics as prescribed.  Patient to follow-up with podiatrist on January 8  As scheduled  2.  Diabetes:  Resume outpatient regimen.  Outpatient monitoring by MD at rehab  3.  Essential hypertension: Blood pressure controlled.  Continue isosorbide, lisinopril, metoprolol  4.  Depression: Continue Effexor, Remeron, Xanax and trazodone  5.  Anemia of chronic disease: Continue ferrous sulfate  6.  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  Hyperlipidemia: Continue statin  8.  CAD: Continue aspirin, statin and metoprolol Stable  DVT prophylaxis; Lovenox during this admission    DISCHARGE CONDITIONS:  Stable CONSULTS OBTAINED:  Treatment Team:  Recardo Evangelist, DPM DRUG ALLERGIES:   Allergies  Allergen Reactions  . Bactrim [Sulfamethoxazole-Trimethoprim]   . Codeine  Nausea And Vomiting  . Flu Virus Vaccine     Other reaction(s): Other (See Comments) Passed out for 12 days  . Influenza Vaccines Other (See Comments)    Reaction:  Caused pt to pass out   . Methadone Hives and Itching  . Oxycodone-Acetaminophen Nausea And Vomiting  . Percocet [Oxycodone-Acetaminophen] Nausea And Vomiting  . Sulfa  Antibiotics   . Tetanus Antitoxin   . Tetanus Toxoids Swelling and Other (See Comments)    Reaction:  Swelling at injection site  . Penicillin G Rash    Has patient had a PCN reaction causing immediate rash, facial/tongue/throat swelling, SOB or lightheadedness with hypotension: Yes Has patient had a PCN reaction causing severe rash involving mucus membranes or skin necrosis: No Has patient had a PCN reaction that required hospitalization: No Has patient had a PCN reaction occurring within the last 10 years: No If all of the above answers are "NO", then may proceed with Cephalosporin use..  . Tetracyclines & Related Rash   DISCHARGE MEDICATIONS:   Allergies as of 12/23/2018      Reactions   Bactrim [sulfamethoxazole-trimethoprim]    Codeine Nausea And Vomiting   Flu Virus Vaccine    Other reaction(s): Other (See Comments) Passed out for 12 days   Influenza Vaccines Other (See Comments)   Reaction:  Caused pt to pass out    Methadone Hives, Itching   Oxycodone-acetaminophen Nausea And Vomiting   Percocet [oxycodone-acetaminophen] Nausea And Vomiting   Sulfa Antibiotics    Tetanus Antitoxin    Tetanus Toxoids Swelling, Other (See Comments)   Reaction:  Swelling at injection site   Penicillin G Rash   Has patient had a PCN reaction causing immediate rash, facial/tongue/throat swelling, SOB or lightheadedness with hypotension: Yes Has patient had a PCN reaction causing severe rash involving mucus membranes or skin necrosis: No Has patient had a PCN reaction that required hospitalization: No Has patient had a PCN reaction occurring within the last 10 years: No If all of the above answers are "NO", then may proceed with Cephalosporin use..   Tetracyclines & Related Rash      Medication List    STOP taking these medications   nitrofurantoin (macrocrystal-monohydrate) 100 MG capsule Commonly known as:  MACROBID     TAKE these medications   acetaminophen 500 MG tablet Commonly  known as:  TYLENOL Take 1,000 mg by mouth every 8 (eight) hours as needed for mild pain or headache.   ALPRAZolam 1 MG tablet Commonly known as:  XANAX Take 1 tablet (1 mg total) by mouth 3 (three) times daily.   aspirin EC 81 MG tablet Take 81 mg by mouth daily.   atorvastatin 20 MG tablet Commonly known as:  LIPITOR Take 20 mg by mouth daily.   Biotin 5 MG Caps Take 5 mg by mouth daily.   celecoxib 200 MG capsule Commonly known as:  CELEBREX Take 200 mg by mouth daily.   conjugated estrogens vaginal cream Commonly known as:  PREMARIN Place 1 Applicatorful vaginally daily. Use pea sized amount M-W-Fr before bedtime   cyclobenzaprine 10 MG tablet Commonly known as:  FLEXERIL Take 1 tablet (10 mg total) by mouth 3 (three) times daily as needed for muscle spasms.   DIPHEN 25 MG tablet Generic drug:  diphenhydrAMINE Take 50 mg by mouth 2 (two) times daily as needed for itching, allergies or sleep.   diphenoxylate-atropine 2.5-0.025 MG tablet Commonly known as:  LOMOTIL Take 1 tablet by mouth every 4 (four)  hours as needed for diarrhea or loose stools.   ferrous sulfate 325 (65 FE) MG tablet Take 325 mg by mouth daily with breakfast.   insulin aspart 100 UNIT/ML injection Commonly known as:  novoLOG Inject 3-15 Units into the skin 3 (three) times daily with meals as needed for high blood sugar. Pt uses as needed per sliding scale:    Less than 140:  0 units  140-180:  3 units 181-220:  4 units 221- 260:  6 units 261- 320:  8 units 321-360:  10 units 361-400:  12 units Greater than 400:  15 units   Insulin Glargine 300 UNIT/ML Sopn Commonly known as:  TOUJEO SOLOSTAR Inject 15 Units into the skin daily. What changed:  additional instructions   isosorbide mononitrate 30 MG 24 hr tablet Commonly known as:  IMDUR Take 30 mg by mouth daily.   levofloxacin 500 MG tablet Commonly known as:  LEVAQUIN Take 1 tablet (500 mg total) by mouth daily for 7 days.    lisinopril 40 MG tablet Commonly known as:  PRINIVIL,ZESTRIL Take 1 tablet (40 mg total) by mouth daily. What changed:   how much to take when to take this   metoprolol tartrate 25 MG tablet Commonly known as:  LOPRESSOR Take 25 mg by mouth 2 (two) times daily.   mirtazapine 15 MG tablet Commonly known as:  REMERON Take 15 mg by mouth at bedtime.   morphine 15 MG 12 hr tablet Commonly known as:  MS CONTIN Take 1 tablet (15 mg total) by mouth every 12 (twelve) hours.   morphine 15 MG tablet Commonly known as:  MSIR Take 1 tablet (15 mg total) by mouth 3 (three) times daily.   multivitamin with minerals Tabs tablet Take 1 tablet by mouth daily.   nitroGLYCERIN 0.4 MG SL tablet Commonly known as:  NITROSTAT Place 0.4 mg under the tongue every 5 (five) minutes as needed for chest pain.   omeprazole 20 MG capsule Commonly known as:  PRILOSEC Take 20 mg by mouth daily.   polyethylene glycol powder powder Commonly known as:  GLYCOLAX/MIRALAX 17 grams as needed each time for constpation   pregabalin 150 MG capsule Commonly known as:  LYRICA Take 1 capsule (150 mg total) by mouth 2 (two) times daily.   TOVIAZ 8 MG Tb24 tablet Generic drug:  fesoterodine Take 8 mg by mouth daily.   traZODone 100 MG tablet Commonly known as:  DESYREL Take 100 mg by mouth at bedtime.   venlafaxine 75 MG tablet Commonly known as:  EFFEXOR Take 75 mg by mouth 3 (three) times daily with meals.   vitamin B-12 1000 MCG tablet Commonly known as:  CYANOCOBALAMIN Take 5,000 mcg by mouth daily.   Vitamin D (Ergocalciferol) 1.25 MG (50000 UT) Caps capsule Commonly known as:  DRISDOL Take 50,000 Units by mouth every Sunday.        DISCHARGE INSTRUCTIONS:   DIET:  Cardiac diet DISCHARGE CONDITION:  Stable ACTIVITY:  See recommendations on activities by podiatrist as detailed above  OXYGEN:  Home Oxygen: No.  Oxygen Delivery: room air DISCHARGE LOCATION:  nursing home   If  you experience worsening of your admission symptoms, develop shortness of breath, life threatening emergency, suicidal or homicidal thoughts you must seek medical attention immediately by calling 911 or calling your MD immediately  if symptoms less severe.  You Must read complete instructions/literature along with all the possible adverse reactions/side effects for all the Medicines you take and that have  been prescribed to you. Take any new Medicines after you have completely understood and accpet all the possible adverse reactions/side effects.   Please note  You were cared for by a hospitalist during your hospital stay. If you have any questions about your discharge medications or the care you received while you were in the hospital after you are discharged, you can call the unit and asked to speak with the hospitalist on call if the hospitalist that took care of you is not available. Once you are discharged, your primary care physician will handle any further medical issues. Please note that NO REFILLS for any discharge medications will be authorized once you are discharged, as it is imperative that you return to your primary care physician (or establish a relationship with a primary care physician if you do not have one) for your aftercare needs so that they can reassess your need for medications and monitor your lab values.    On the day of Discharge:  VITAL SIGNS:  Blood pressure 130/72, pulse 78, temperature 98.1 F (36.7 C), resp. rate 17, height 5\' 7"  (1.702 m), weight 82.2 kg, SpO2 94 %. PHYSICAL EXAMINATION:  GENERAL:  73 y.o.-year-old patient lying in the bed with no acute distress.  EYES: Pupils equal, round, reactive to light and accommodation. No scleral icterus. Extraocular muscles intact.  HEENT: Head atraumatic, normocephalic. Oropharynx and nasopharynx clear.  NECK:  Supple, no jugular venous distention. No thyroid enlargement, no tenderness.  LUNGS: Normal breath sounds  bilaterally, no wheezing, rales,rhonchi or crepitation. No use of accessory muscles of respiration.  CARDIOVASCULAR: S1, S2 normal. No murmurs, rubs, or gallops.  ABDOMEN: Soft, non-tender, non-distended. Bowel sounds present. No organomegaly or mass.  EXTREMITIES: No pedal edema, cyanosis, or clubbing.  NEUROLOGIC: Cranial nerves II through XII are intact. Muscle strength 5/5 in all extremities. Sensation intact. Gait not checked.  PSYCHIATRIC: The patient is alert and oriented x 3.  SKIN: No obvious rash, lesion, or ulcer.  DATA REVIEW:   CBC Recent Labs  Lab 12/23/18 0502  WBC 7.7  HGB 11.4*  HCT 36.5  PLT 330    Chemistries  Recent Labs  Lab 12/19/18 1638  12/23/18 0502  NA 135   < > 137  K 4.0   < > 4.1  CL 99   < > 102  CO2 24   < > 29  GLUCOSE 235*   < > 250*  BUN 19   < > 13  CREATININE 0.73   < > 0.63  CALCIUM 9.4   < > 9.0  MG  --    < > 2.1  AST 28  --   --   ALT 16  --   --   ALKPHOS 78  --   --   BILITOT 0.3  --   --    < > = values in this interval not displayed.     Microbiology Results  Results for orders placed or performed during the hospital encounter of 12/19/18  Aerobic/Anaerobic Culture (surgical/deep wound)     Status: None (Preliminary result)   Collection Time: 12/21/18  8:34 AM  Result Value Ref Range Status   Specimen Description   Final    TISSUE Performed at [facility], [address], [place], [place] [address]    Special Requests   Final    NONE Performed at [facility], [address], [place], [place] [address]    Gram Stain   Final  FEW WBC PRESENT, PREDOMINANTLY PMN FEW GRAM POSITIVE COCCI IN CLUSTERS Performed at [facility], [address] [place], [place] [address]    Culture   Final    MODERATE STAPHYLOCOCCUS AUREUS CULTURE REINCUBATED FOR BETTER GROWTH    Report Status PENDING  Incomplete    RADIOLOGY:  No results found.   Management plans discussed with the patient, family  and they are in agreement.  CODE STATUS: DNR  Universal DNR signed and placed in chart.  TOTAL TIME TAKING CARE OF THIS PATIENT: 40 minutes.    Alik Mawson M.D on 12/23/2018 at 1:33 PM  Between 7am to 6pm - Pager - [phone]  After 6pm go to [email or web address] - Social research officer, governmentpassword [email or web address] [email or web address]  [facility] [place] [facility]  Office  [phone]  CC: Primary care physician; Garlon HatchetMcConville, Robert, MD   Note: This dictation was prepared with Dragon dictation along with smaller phrase technology. Any transcriptional errors that result from this process are unintentional.

## 2018-12-25 DIAGNOSIS — M86171 Other acute osteomyelitis, right ankle and foot: Secondary | ICD-10-CM | POA: Diagnosis not present

## 2018-12-25 DIAGNOSIS — E119 Type 2 diabetes mellitus without complications: Secondary | ICD-10-CM | POA: Diagnosis not present

## 2018-12-25 DIAGNOSIS — F419 Anxiety disorder, unspecified: Secondary | ICD-10-CM | POA: Diagnosis not present

## 2018-12-25 DIAGNOSIS — E785 Hyperlipidemia, unspecified: Secondary | ICD-10-CM | POA: Diagnosis not present

## 2018-12-25 DIAGNOSIS — G9009 Other idiopathic peripheral autonomic neuropathy: Secondary | ICD-10-CM | POA: Diagnosis not present

## 2018-12-25 DIAGNOSIS — I10 Essential (primary) hypertension: Secondary | ICD-10-CM | POA: Diagnosis not present

## 2018-12-25 DIAGNOSIS — G8929 Other chronic pain: Secondary | ICD-10-CM | POA: Diagnosis not present

## 2018-12-26 LAB — AEROBIC/ANAEROBIC CULTURE W GRAM STAIN (SURGICAL/DEEP WOUND)

## 2018-12-26 LAB — AEROBIC/ANAEROBIC CULTURE (SURGICAL/DEEP WOUND)

## 2018-12-28 ENCOUNTER — Other Ambulatory Visit: Payer: Self-pay | Admitting: *Deleted

## 2018-12-28 NOTE — Patient Outreach (Signed)
[facility]) Care Management  12/28/2018  Jessica Donovan [birth year] [id number]   Attended interdisciplinary team meeting yesterday at [facility]  to discuss patient's progress and plan for transitioning to home. SW stated patient had recently arrived and discharge plan was not in place at this time. Patient was noted not to have a supportive family nearby. PT states patient has not been participating in therapy as much as they would like related to patient feels she has to continually have her affect foot elevated. PT continues to work on patient's understanding of MD's orders.  Went to patient's bedside and patient very familiar with [facility] CM services.  Made patient aware I would follow her progress and discharge needs while in the facility.   Plan to follow up with patient at next facility site visit.   Costella Hatcher RN, BSN [facility]  Post Acute Care Coordinator [phone]) Business Mobile [phone]) Toll free office

## 2019-01-01 DIAGNOSIS — E785 Hyperlipidemia, unspecified: Secondary | ICD-10-CM | POA: Diagnosis not present

## 2019-01-01 DIAGNOSIS — D649 Anemia, unspecified: Secondary | ICD-10-CM | POA: Diagnosis not present

## 2019-01-01 DIAGNOSIS — G8929 Other chronic pain: Secondary | ICD-10-CM | POA: Diagnosis not present

## 2019-01-01 DIAGNOSIS — G9009 Other idiopathic peripheral autonomic neuropathy: Secondary | ICD-10-CM | POA: Diagnosis not present

## 2019-01-01 DIAGNOSIS — F419 Anxiety disorder, unspecified: Secondary | ICD-10-CM | POA: Diagnosis not present

## 2019-01-01 DIAGNOSIS — E119 Type 2 diabetes mellitus without complications: Secondary | ICD-10-CM | POA: Diagnosis not present

## 2019-01-01 DIAGNOSIS — M86171 Other acute osteomyelitis, right ankle and foot: Secondary | ICD-10-CM | POA: Diagnosis not present

## 2019-01-01 DIAGNOSIS — I10 Essential (primary) hypertension: Secondary | ICD-10-CM | POA: Diagnosis not present

## 2019-01-03 ENCOUNTER — Other Ambulatory Visit: Payer: Self-pay | Admitting: *Deleted

## 2019-01-03 NOTE — Patient Outreach (Signed)
[facility]) Care Management  01/03/2019  Latoshia Czaja [birth year] [id number]   Attended interdisciplinary team meeting at [facility] with to discuss patient's progress and plan for transitioning to home. Patient is continuing to get dressing changes for toe amputation.  PT states patient is progressing and motivated for therapies.  Advancing to home rep stating patient may need transportation related to patient is from home alone.  No caregiver listed to do dressing changes.   Advancing to home Rep in to see patient, collaboration with rep after her visit.  Rep states patient reporting she does not have a caregiver to train to do dressing changes. Rep stated patient has to have a trainable caregiver before [facility] can open the case.   Discussed this patient with [facility] LCSW, patient is known to her.  Will plan to send a [facility] referral when patient is closer to discharge.   Costella Hatcher RN, BSN [facility]  Post Acute Care Coordinator [phone]) Business Mobile [phone]) Toll free office

## 2019-01-10 ENCOUNTER — Other Ambulatory Visit: Payer: Self-pay | Admitting: *Deleted

## 2019-01-10 NOTE — Patient Outreach (Signed)
[facility]) Care Management  01/10/2019  Jessica Donovan 1946-10-08 [id number]   Attended interdisciplinary team meeting at  [facility]  to discuss patient's progress and plan for transitioning to home. PT states patient is independent with ambulation and ADLs. SW states patient will not have to go to wound clinic as initially thought so no transportation needs anticipated.  No [facility] Care Management services  care management needs identified at this time.   Costella Hatcher RN, BSN [facility]  Post Acute Care Coordinator [phone]) Business Mobile [phone]) Toll free office

## 2019-01-12 DIAGNOSIS — E785 Hyperlipidemia, unspecified: Secondary | ICD-10-CM | POA: Diagnosis not present

## 2019-01-12 DIAGNOSIS — G8929 Other chronic pain: Secondary | ICD-10-CM | POA: Diagnosis not present

## 2019-01-12 DIAGNOSIS — F419 Anxiety disorder, unspecified: Secondary | ICD-10-CM | POA: Diagnosis not present

## 2019-01-12 DIAGNOSIS — M86171 Other acute osteomyelitis, right ankle and foot: Secondary | ICD-10-CM | POA: Diagnosis not present

## 2019-01-12 DIAGNOSIS — E119 Type 2 diabetes mellitus without complications: Secondary | ICD-10-CM | POA: Diagnosis not present

## 2019-01-12 DIAGNOSIS — G9009 Other idiopathic peripheral autonomic neuropathy: Secondary | ICD-10-CM | POA: Diagnosis not present

## 2019-01-12 DIAGNOSIS — I10 Essential (primary) hypertension: Secondary | ICD-10-CM | POA: Diagnosis not present

## 2019-01-14 DIAGNOSIS — G9009 Other idiopathic peripheral autonomic neuropathy: Secondary | ICD-10-CM | POA: Diagnosis not present

## 2019-01-14 DIAGNOSIS — E119 Type 2 diabetes mellitus without complications: Secondary | ICD-10-CM | POA: Diagnosis not present

## 2019-01-14 DIAGNOSIS — G8929 Other chronic pain: Secondary | ICD-10-CM | POA: Diagnosis not present

## 2019-01-14 DIAGNOSIS — F419 Anxiety disorder, unspecified: Secondary | ICD-10-CM | POA: Diagnosis not present

## 2019-01-14 DIAGNOSIS — M86171 Other acute osteomyelitis, right ankle and foot: Secondary | ICD-10-CM | POA: Diagnosis not present

## 2019-01-14 DIAGNOSIS — E785 Hyperlipidemia, unspecified: Secondary | ICD-10-CM | POA: Diagnosis not present

## 2019-01-14 DIAGNOSIS — I10 Essential (primary) hypertension: Secondary | ICD-10-CM | POA: Diagnosis not present

## 2019-01-18 ENCOUNTER — Other Ambulatory Visit: Payer: Self-pay | Admitting: *Deleted

## 2019-01-18 NOTE — Patient Outreach (Signed)
[facility]) Care Management  01/18/2019  Jessica Donovan 1946/12/17 [id number]   Transition of Care Referral   Referral Date: 01/17/2019 Referral Source:HTA IP discharge Date of Admission: 12/23/2018 Diagnosis: osteomyelitis of the right foot Date of Discharge:Facility: [facility] [place] on 01/13/19 Insurance:  HTA   Outreach attempt # 1 No answer. [facility] RN CM left HIPAA compliant voicemail message along with CM's contact info.   Plan: [facility] RN CM sent an unsuccessful outreach letter and scheduled this patient for another call attempt within 4 business days   Kimberly L. Noelle Penner, RN, BSN, CCM [facility] Telephonic Care Management Care Coordinator Direct Number [phone] Mobile number [phone]  Main [facility] number [phone] Fax number [phone]

## 2019-01-19 ENCOUNTER — Ambulatory Visit: Payer: Self-pay | Admitting: *Deleted

## 2019-01-20 ENCOUNTER — Other Ambulatory Visit: Payer: Self-pay | Admitting: *Deleted

## 2019-01-20 NOTE — Patient Outreach (Signed)
[facility]) Care Management  01/20/2019  Mindel Verhelst [birth year] [id number]   Transition of Care Referral  Referral Date: 01/17/2019 Referral Source:HTA IP discharge Date of Admission: 12/23/2018 Diagnosis: osteomyelitis of the right foot Date of Discharge:Facility: [facility] [place] on 01/13/19 Insurance:  HTA   Outreach attempt # 2 No answer. [facility] RN CM left HIPAA compliant voicemail message along with CM's contact info.   Plan: [facility] RN CM scheduled this patient for another call attempt within 4 business days   Kimberly L. Noelle Penner, RN, BSN, CCM [facility] Telephonic Care Management Care Coordinator Direct Number [phone] Mobile number [phone]  Main [facility] number [phone] Fax number [phone]

## 2019-01-23 ENCOUNTER — Ambulatory Visit: Payer: Self-pay | Admitting: *Deleted

## 2019-01-24 ENCOUNTER — Other Ambulatory Visit: Payer: Self-pay | Admitting: *Deleted

## 2019-01-24 NOTE — Patient Outreach (Signed)
[facility]) Care Management  01/24/2019  Martin Umpierre 29-Mar-1946 [id number]   Transition of Care Referral  Referral Date:01/17/2019 Referral Source:HTA IP discharge Date of Admission:12/23/2018 Diagnosis:osteomyelitis of the right foot Date of Discharge:Facility:[facility] [place] on 01/13/19 Insurance:HTA  Outreach attempt # 3 No answer. [facility] RN CM left HIPAA compliant voicemail message along with CM's contact info.   Plan: [facility] RN CM scheduled this patient for case closure within 6 business days   Paden Kuras L. Noelle Penner, RN, BSN, CCM [facility] Telephonic Care Management Care Coordinator Direct Number [phone] Mobile number [phone]  Main [facility] number [phone] Fax number [phone]

## 2019-01-31 DIAGNOSIS — E1142 Type 2 diabetes mellitus with diabetic polyneuropathy: Secondary | ICD-10-CM | POA: Diagnosis not present

## 2019-01-31 DIAGNOSIS — M869 Osteomyelitis, unspecified: Secondary | ICD-10-CM | POA: Diagnosis not present

## 2019-01-31 DIAGNOSIS — Z9889 Other specified postprocedural states: Secondary | ICD-10-CM | POA: Diagnosis not present

## 2019-02-01 ENCOUNTER — Other Ambulatory Visit: Payer: Self-pay | Admitting: *Deleted

## 2019-02-01 NOTE — Patient Outreach (Signed)
  [facility]) Care Management  02/01/2019  Jessica Donovan 04-Jan-1946 [id number]   Case closure   Call attempts made on 01/18/2019, 01/20/2019 and 01/24/2019 Unsuccessful outreach letter sent on 01/18/2019 without a response   Plan [facility] RN CM will close case after no response from patient within 10 business days. Unable to reach  [name] Noelle Penner, RN, BSN, CCM [facility] Telephonic Care Management Care Coordinator Office number [phone] Mobile number [phone]  Main [facility] number [phone] Fax number [phone]

## 2019-02-07 DIAGNOSIS — F329 Major depressive disorder, single episode, unspecified: Secondary | ICD-10-CM | POA: Diagnosis not present

## 2019-02-07 DIAGNOSIS — M545 Low back pain: Secondary | ICD-10-CM | POA: Diagnosis not present

## 2019-02-07 DIAGNOSIS — I259 Chronic ischemic heart disease, unspecified: Secondary | ICD-10-CM | POA: Diagnosis not present

## 2019-02-07 DIAGNOSIS — F411 Generalized anxiety disorder: Secondary | ICD-10-CM | POA: Diagnosis not present

## 2019-02-07 DIAGNOSIS — Z955 Presence of coronary angioplasty implant and graft: Secondary | ICD-10-CM | POA: Diagnosis not present

## 2019-02-07 DIAGNOSIS — E1165 Type 2 diabetes mellitus with hyperglycemia: Secondary | ICD-10-CM | POA: Diagnosis not present

## 2019-02-07 DIAGNOSIS — N390 Urinary tract infection, site not specified: Secondary | ICD-10-CM | POA: Diagnosis not present

## 2019-02-07 DIAGNOSIS — M25512 Pain in left shoulder: Secondary | ICD-10-CM | POA: Diagnosis not present

## 2019-02-07 DIAGNOSIS — M79602 Pain in left arm: Secondary | ICD-10-CM | POA: Diagnosis not present

## 2019-02-07 DIAGNOSIS — M5441 Lumbago with sciatica, right side: Secondary | ICD-10-CM | POA: Diagnosis not present

## 2019-02-07 DIAGNOSIS — G8929 Other chronic pain: Secondary | ICD-10-CM | POA: Diagnosis not present

## 2019-02-07 DIAGNOSIS — E1142 Type 2 diabetes mellitus with diabetic polyneuropathy: Secondary | ICD-10-CM | POA: Diagnosis not present

## 2019-03-29 ENCOUNTER — Telehealth: Payer: Self-pay

## 2019-03-29 NOTE — Telephone Encounter (Signed)
Attempted to call patient. LMTCB 03/29/2019

## 2019-03-29 NOTE — Telephone Encounter (Signed)
Call patient from recall to see if she would like to schedule an appointment for her 12 month follow up.  Patient stated that she does not have a smart phone but would rather see Dr. Mariah Milling in person.  Patient states she is currently having some issues with her heart but did not go into detail.  Can you please advise and let me know if its okay to add her for an in office appointment.

## 2019-03-30 NOTE — Telephone Encounter (Addendum)
I spoke with the patient.  I advised her we were trying to follow up on reported "issues" that she is having with her heart.   Per the patient, for the last 3 months she has been having intermittent, midsternal chest pain that feels like someone is putting their fist in between her breast and pressing in. She will occasionally have pain radiating in her jaw and left arm as well.  She states " I have taken more NTG over the last 3 months than I have in my life." When she takes the NTG, she typically requires at least 2 for her symptoms to start to resolve then she will go lay down.   Symptoms can happen at rest and with activity. She does not have symptoms after eating a meal. She does report intermittent nausea that will require her to take phenergan.  She does not recall if this occurs with her chest pain.  She has a history of coronary stenting. She does not recall if symptoms were similar prior to stenting.  I have advised the patient that I feel like I need to send this message to Dr. Mariah Milling for him to determine if she needs and in office visit vs an e-vist. The patient states she is "terrified" to come in to the hospital due to COVID-19 and she doesn't think an E-visit will help. She states she hasn't called about her symptoms because of her fear of coming in. I advised that I feel like I at least need to make the doctor aware of her symptoms and let him advise from there.  Although the option to do nothing is her right at this time.  The patient voices understanding and is agreeable.

## 2019-03-30 NOTE — Telephone Encounter (Signed)
Attempted to call the patient. No answer- I left a message on her identified voice mail to please call back to discuss her current symptoms.

## 2019-03-30 NOTE — Telephone Encounter (Signed)
She probably needs to come in for visit. If cath needed for unstable angina sx,  That may be too hard to set up over the phone and labs might be needed

## 2019-03-31 NOTE — Telephone Encounter (Signed)
Spoke with patient.  Made her aware that Dr. Mariah Milling would like her to have an in office appointment to assess her symptoms.  She stated that she was afraid to come in and would rather die at her home from a heart attack than have to come into the hospital .  She stated that she has had a lot of health issues going on and does not want to risk the chance of getting COVID19.  She stated that if she changes her mind she would give [place] a call.  Made her aware that someone is always in the office and if she needs to come in just to call the office.

## 2019-04-04 NOTE — Telephone Encounter (Signed)
Would put on schedule tomorrow for telephone or web visit

## 2019-04-04 NOTE — Telephone Encounter (Signed)
Spoke with patient and she denied virtual visit via video or telephone. She wants to wait for now and stated that she would just assume die at home than anything else. Advised that I would make provider aware and she states that if anything changes she will call us back at a later time if willing to do a telephone visit with Dr. Mariah Milling. She was appreciative for the call with no further questions at this time.

## 2019-05-10 ENCOUNTER — Emergency Department: Payer: Medicare HMO

## 2019-05-10 ENCOUNTER — Emergency Department
Admission: EM | Admit: 2019-05-10 | Discharge: 2019-05-11 | Disposition: A | Discharge status: Home / Self Care | Payer: Medicare HMO | Attending: Emergency Medicine | Admitting: Emergency Medicine

## 2019-05-10 ENCOUNTER — Other Ambulatory Visit: Payer: Self-pay

## 2019-05-10 DIAGNOSIS — Z1159 Encounter for screening for other viral diseases: Secondary | ICD-10-CM | POA: Diagnosis not present

## 2019-05-10 DIAGNOSIS — I10 Essential (primary) hypertension: Secondary | ICD-10-CM | POA: Diagnosis not present

## 2019-05-10 DIAGNOSIS — F0390 Unspecified dementia without behavioral disturbance: Secondary | ICD-10-CM | POA: Diagnosis not present

## 2019-05-10 DIAGNOSIS — I252 Old myocardial infarction: Secondary | ICD-10-CM | POA: Diagnosis not present

## 2019-05-10 DIAGNOSIS — I2510 Atherosclerotic heart disease of native coronary artery without angina pectoris: Secondary | ICD-10-CM | POA: Diagnosis not present

## 2019-05-10 DIAGNOSIS — R4182 Altered mental status, unspecified: Secondary | ICD-10-CM | POA: Insufficient documentation

## 2019-05-10 DIAGNOSIS — E119 Type 2 diabetes mellitus without complications: Secondary | ICD-10-CM | POA: Diagnosis not present

## 2019-05-10 DIAGNOSIS — Z8673 Personal history of transient ischemic attack (TIA), and cerebral infarction without residual deficits: Secondary | ICD-10-CM | POA: Diagnosis not present

## 2019-05-10 LAB — CBC WITH DIFFERENTIAL/PLATELET
Abs Immature Granulocytes: 0.03 10*3/uL (ref 0.00–0.07)
Basophils Absolute: 0 10*3/uL (ref 0.0–0.1)
Basophils Relative: 1 %
Eosinophils Absolute: 0.2 10*3/uL (ref 0.0–0.5)
Eosinophils Relative: 2 %
HCT: 40.9 % (ref 36.0–46.0)
Hemoglobin: 13 g/dL (ref 12.0–15.0)
Immature Granulocytes: 0 %
Lymphocytes Relative: 41 %
Lymphs Abs: 3 10*3/uL (ref 0.7–4.0)
MCH: 32.1 pg (ref 26.0–34.0)
MCHC: 31.8 g/dL (ref 30.0–36.0)
MCV: 101 fL — ABNORMAL HIGH (ref 80.0–100.0)
Monocytes Absolute: 0.8 10*3/uL (ref 0.1–1.0)
Monocytes Relative: 11 %
Neutro Abs: 3.4 10*3/uL (ref 1.7–7.7)
Neutrophils Relative %: 45 %
Platelets: 240 10*3/uL (ref 150–400)
RBC: 4.05 MIL/uL (ref 3.87–5.11)
RDW: 13 % (ref 11.5–15.5)
WBC: 7.5 10*3/uL (ref 4.0–10.5)
nRBC: 0 % (ref 0.0–0.2)

## 2019-05-10 LAB — URINALYSIS, COMPLETE (UACMP) WITH MICROSCOPIC
Bacteria, UA: NONE SEEN
Bilirubin Urine: NEGATIVE
Glucose, UA: NEGATIVE mg/dL
Hgb urine dipstick: NEGATIVE
Ketones, ur: NEGATIVE mg/dL
Leukocytes,Ua: NEGATIVE
Nitrite: NEGATIVE
Protein, ur: NEGATIVE mg/dL
Specific Gravity, Urine: 1.014 (ref 1.005–1.030)
pH: 5 (ref 5.0–8.0)

## 2019-05-10 LAB — COMPREHENSIVE METABOLIC PANEL
ALT: 18 U/L (ref 0–44)
AST: 30 U/L (ref 15–41)
Albumin: 4 g/dL (ref 3.5–5.0)
Alkaline Phosphatase: 64 U/L (ref 38–126)
Anion gap: 9 (ref 5–15)
BUN: 25 mg/dL — ABNORMAL HIGH (ref 8–23)
CO2: 24 mmol/L (ref 22–32)
Calcium: 9.3 mg/dL (ref 8.9–10.3)
Chloride: 101 mmol/L (ref 98–111)
Creatinine, Ser: 0.78 mg/dL (ref 0.44–1.00)
GFR calc Af Amer: >60 mL/min (ref >60)
GFR calc non Af Amer: >60 mL/min (ref >60)
Glucose, Bld: 206 mg/dL — ABNORMAL HIGH (ref 70–99)
Potassium: 4.6 mmol/L (ref 3.5–5.1)
Sodium: 134 mmol/L — ABNORMAL LOW (ref 135–145)
Total Bilirubin: 0.6 mg/dL (ref 0.3–1.2)
Total Protein: 6.7 g/dL (ref 6.5–8.1)

## 2019-05-10 LAB — URINE DRUG SCREEN, QUALITATIVE (ARMC ONLY)
Amphetamines, Ur Screen: NOT DETECTED
Barbiturates, Ur Screen: NOT DETECTED
Benzodiazepine, Ur Scrn: POSITIVE — AB
Cannabinoid 50 Ng, Ur ~~LOC~~: NOT DETECTED
Cocaine Metabolite,Ur ~~LOC~~: NOT DETECTED
MDMA (Ecstasy)Ur Screen: NOT DETECTED
Methadone Scn, Ur: NOT DETECTED
Opiate, Ur Screen: POSITIVE — AB
Phencyclidine (PCP) Ur S: NOT DETECTED
Tricyclic, Ur Screen: POSITIVE — AB

## 2019-05-10 LAB — ETHANOL: Alcohol, Ethyl (B): <10 mg/dL (ref <10)

## 2019-05-10 LAB — SARS CORONAVIRUS 2 BY RT PCR (HOSPITAL ORDER, PERFORMED IN ~~LOC~~ HOSPITAL LAB): SARS Coronavirus 2: NEGATIVE

## 2019-05-10 LAB — GLUCOSE, CAPILLARY: Glucose-Capillary: 207 mg/dL — ABNORMAL HIGH (ref 70–99)

## 2019-05-10 LAB — TROPONIN I: Troponin I: <0.03 ng/mL (ref <0.03)

## 2019-05-10 MED ORDER — SODIUM CHLORIDE 0.9 % IV SOLN
Freq: Once | INTRAVENOUS | Status: AC
  Administered 2019-05-10: 14:00:00 via INTRAVENOUS

## 2019-05-10 MED ORDER — NALOXONE HCL 2 MG/2ML IJ SOSY
0.4000 mg | PREFILLED_SYRINGE | Freq: Once | INTRAMUSCULAR | Status: AC
  Administered 2019-05-10: 0.4 mg via INTRAVENOUS
  Filled 2019-05-10: qty 2

## 2019-05-10 MED ORDER — FLUMAZENIL 0.5 MG/5ML IV SOLN: 0.2000 mg | Freq: Once | INTRAVENOUS | Status: DC

## 2019-05-10 MED ORDER — NALOXONE HCL 2 MG/2ML IJ SOSY
1.0000 mg | PREFILLED_SYRINGE | Freq: Once | INTRAMUSCULAR | Status: AC
  Administered 2019-05-10: 1 mg via INTRAVENOUS

## 2019-05-10 NOTE — ED Notes (Signed)
Pt given ginger ale.

## 2019-05-10 NOTE — TOC Progression Note (Signed)
Transition of Care [facility]) - Progression Note    Patient Details  Name: Jessica Donovan MRN: [id number] Date of Birth: 06-Nov-1946  [facility]) CM/SW Contact  Tania Autum Benfer, LCSW Phone Number: 05/10/2019, 4:56 PM  Clinical Narrative:    4:57p   CSW informed pt's POA, Gaetano Net, that pt will be staying overnight, per EDP; and that home health services for nurse and nurse aide have been set up.    Expected Discharge Plan: Home w Home Health Services Barriers to Discharge: Continued Medical Work up  Expected Discharge Plan and Services Expected Discharge Plan: Home w Home Health Services In-house Referral: Clinical Social Work Discharge Planning Services: CM Consult Post Acute Care Choice: Home Health Living arrangements for the past 2 months: Apartment                           HH Arranged: RN, Nurse's Aide   Date HH Agency Contacted: 05/10/19 Time HH Agency Contacted: 1625 Representative spoke with at [facility] Agency: Feliberto Gottron   Social Determinants of Health (SDOH) Interventions    Readmission Risk Interventions No flowsheet data found.

## 2019-05-10 NOTE — ED Triage Notes (Signed)
Patient was found slumped in bed by her neighbor. Patient denies any suicidal intent. EMS presumes that patient has accidentally overdosed on her prescribed medicines.

## 2019-05-10 NOTE — ED Notes (Signed)
Patient's CBG is 207.

## 2019-05-10 NOTE — ED Notes (Signed)
Patient given sandwich tray and soda 

## 2019-05-10 NOTE — Social Work (Signed)
CSW attempted to speak with patient. Patient unable to answer questions at this time. CSW will try again momentarily.    Jessica Donovan  [facility] ED  [phone]

## 2019-05-10 NOTE — TOC Initial Note (Signed)
Transition of Care [facility]) - Initial/Assessment Note    Patient Details  Name: Jessica Donovan MRN: [id number] Date of Birth: [birth year]  Transition of Care [facility] [place]) CM/SW Contact:    Cala Bradford, LCSW Phone Number: 05/10/2019, 4:35 PM  Clinical Narrative:                 Patient is a 73 year old, female that presents to the ED for altered mental status. CSW attempted to ask pt questions, however pt was currently unable to answer.  CSW contacted pt's POA, Gaetano Net [phone]). Tobi Bastos stated that she is pt's former Child psychotherapist that agreed to continue to help pt with her needs. Tobi Bastos shared that she, her neighbor, and several other women continue to check on pt regularly. They take her to her doctor's appointments, get her groceries, walk her dog, amongst other things.  CSW spoke with Gaetano Net about Home Health services, and she stated that she believes pt will benefit from the services, and to get assistance with making sure she takes the right amount of medications, and gets in and out of her bathtub safely (reported that pt recently slipped in the bathroom). She does not have a preference for home health agency. Tobi Bastos shared that pt is able to perform most ADLs. CSW contacted Feliberto Gottron at [facility], and notified EDP to order face to face and home health for nurse, and nurse aide.   Expected Discharge Plan: Home w Home Health Services Barriers to Discharge: Continued Medical Work up   Patient Goals and CMS Choice   CMS Medicare.gov Compare Post Acute Care list provided to:: Patient Represenative (must comment)(Anne Green; pt's POA ) Choice offered to / list presented to : [facility] POA / Guardian  Expected Discharge Plan and Services Expected Discharge Plan: Home w Home Health Services In-house Referral: Clinical Social Work Discharge Planning Services: CM Consult Post Acute Care Choice: Home Health Living arrangements for the past 2 months: Apartment                            HH Arranged: RN, Nurse's Aide   Date HH Agency Contacted: 05/10/19 Time HH Agency Contacted: 1625 Representative spoke with at [facility] Agency: Feliberto Gottron  Prior Living Arrangements/Services Living arrangements for the past 2 months: Apartment Lives with:: Self Patient language and need for interpreter reviewed:: Yes        Need for Family Participation in Patient Care: (no family support, per POA Cresenciano Genre) Care giver support system in place?: Yes (comment)(Anne Neva Seat, and other supports )   Criminal Activity/Legal Involvement Pertinent to Current Situation/Hospitalization: No - Comment as needed  Activities of Daily Living      Permission Sought/Granted Permission sought to share information with : Other (comment), Case Manager(POA)                Emotional Assessment Appearance:: Appears stated age Attitude/Demeanor/Rapport: Unresponsive(Pt was drowsy ) Affect (typically observed): Calm   Alcohol / Substance Use: Other (comment)(hx of opioid overdose ) Psych Involvement: No (comment)  Admission diagnosis:  altered mental status Patient Active Problem List   Diagnosis Date Noted  . Right foot infection 12/19/2018  . Acute encephalopathy 07/06/2018  . Anxiety 06/10/2017  . Chest pain with moderate risk for cardiac etiology 06/09/2017  . UTI (urinary tract infection) 05/19/2017  . CAD (coronary artery disease) 05/19/2017  . Chronic narcotic use 05/19/2017  . Cellulitis in diabetic foot (HCC) 05/19/2017  .  Opioid overdose (HCC) 02/23/2017  . Altered mental status 02/23/2017  . Acute colitis 01/19/2017  . Sepsis (HCC) 01/17/2017  . Cellulitis of fourth toe of right foot 04/10/2016  . Somnolence 04/09/2016  . Dementia (HCC) 04/09/2016  . Cellulitis 04/03/2016  . Hematoma of right parietal scalp 02/23/2016  . Multiple falls 02/23/2016  . Physical debility 02/23/2016  . Type II diabetes mellitus, uncontrolled (HCC) 02/23/2016  . Syncope and collapse 02/23/2016   . Polypharmacy 02/23/2016  . Osteomyelitis of toe (HCC) 01/23/2016  . Rhabdomyolysis 12/07/2015  . ARF (acute renal failure) (HCC) 11/29/2015  . Hypotension 11/29/2015  . 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 Angina effort 11/07/2015  . Osteomyelitis (HCC) 11/07/2015  . Anemia due to chronic blood loss 06/14/2015  . Generalized anxiety disorder 06/14/2015  . Lumbago with sciatica 06/14/2015  . 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  . 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 Abnormal weight loss 05/18/2014  . Chronic pain syndrome 05/18/2014  . 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 Diabetic polyneuropathy (HCC) 05/18/2014  . Type 2 diabetes mellitus with diabetic neuropathy (HCC) 05/07/2014  . Hemorrhage of rectum and anus 01/17/2014  . Cellulitis and abscess of foot excluding toe 05/18/2012   PCP:  Garlon HatchetMcConville, Robert, MD Pharmacy:   [facility] [phone]#[id number] Nicholes Rough- [place], [place] - [address] [place] [address] Phone: [phone] Fax: [phone]  [facility] [place], [place] - [address] [place] [address] Phone: [phone] Fax: [phone]  [facility] [place], [place] - [address]  [place] [address] Phone: [phone] Fax: [phone]     Social Determinants of Health (SDOH) Interventions    Readmission Risk Interventions No flowsheet data found.

## 2019-05-10 NOTE — ED Provider Notes (Signed)
[facility] Emergency Department Provider Note       Time seen: ----------------------------------------- 1:20 PM on 05/10/2019 -----------------------------------------   I have reviewed the triage vital signs and the nursing notes.  HISTORY   Chief Complaint Altered Mental Status    HPI Jessica Donovan is a 73 y.o. female with a history of anxiety, chronic back pain, collagen vascular disease, coronary disease, diabetes, hypertension, MI, osteomyelitis who presents to the ED for altered mental status.  Patient was found poorly responsive in her bed by her neighbor.  She denies any suicidal intent.  EMS presumed that she accidentally overdosed or took too much of her medications.  Patient denies this.  She complains of 8 out of 10 pain in her head and in the center of her back  Past Medical History:  Diagnosis Date  . Anxiety   . Chronic back pain   . Collagen vascular disease (HCC)   . Coronary artery disease    a. s/p PCI/DES to dRCA & mLCx in 2014 with repeat LHC in 10/2015 showing patent stents  . Diabetes mellitus without complication (HCC)   . Hypertension   . Low back pain   . Myocardial infarction (HCC)   . Osteomyelitis of toe (HCC) 01/23/2016  . Stroke [facility](HCC)     Patient Active Problem List   Diagnosis Date Noted  . Right foot infection 12/19/2018  . Acute encephalopathy 07/06/2018  . Anxiety 06/10/2017  . Chest pain with moderate risk for cardiac etiology 06/09/2017  . UTI (urinary tract infection) 05/19/2017  . CAD (coronary artery disease) 05/19/2017  . Chronic narcotic use 05/19/2017  . Cellulitis in diabetic foot (HCC) 05/19/2017  . Opioid overdose (HCC) 02/23/2017  . Altered mental status 02/23/2017  . Acute colitis 01/19/2017  . Sepsis (HCC) 01/17/2017  . Cellulitis of fourth toe of right foot 04/10/2016  . Somnolence 04/09/2016  . Dementia (HCC) 04/09/2016  . Cellulitis 04/03/2016  . Hematoma of right parietal scalp  02/23/2016  . Multiple falls 02/23/2016  . Physical debility 02/23/2016  . Type II diabetes mellitus, uncontrolled (HCC) 02/23/2016  . Syncope and collapse 02/23/2016  . Polypharmacy 02/23/2016  . Osteomyelitis of toe (HCC) 01/23/2016  . Rhabdomyolysis 12/07/2015  . ARF (acute renal failure) (HCC) 11/29/2015  . Hypotension 11/29/2015  . 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 Angina effort 11/07/2015  . Osteomyelitis (HCC) 11/07/2015  . Anemia due to chronic blood loss 06/14/2015  . Generalized anxiety disorder 06/14/2015  . Lumbago with sciatica 06/14/2015  . 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  . 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 Abnormal weight loss 05/18/2014  . Chronic pain syndrome 05/18/2014  . 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 Diabetic polyneuropathy (HCC) 05/18/2014  . Type 2 diabetes mellitus with diabetic neuropathy (HCC) 05/07/2014  . Hemorrhage of rectum and anus 01/17/2014  . Cellulitis and abscess of foot excluding toe 05/18/2012    Past Surgical History:  Procedure Laterality Date  . AMPUTATION TOE Left 11/10/2015   Procedure: AMPUTATION TOE;  Surgeon: Linus Galasodd Cline, MD;  Location: [facility] ORS;  Service: Podiatry;  Laterality: Left;  . AMPUTATION TOE Left 01/24/2016   Procedure: AMPUTATION TOE (2nd mpj);  Surgeon: Linus Galasodd Cline, DPM;  Location: [facility] ORS;  Service: Podiatry;  Laterality: Left;  . AMPUTATION TOE Right 12/21/2018   Procedure: 2ND TOE AMPUTATION WITH DEBRIDEMENT OF SOFT TISSUE;  Surgeon: Recardo Evangelistroxler, Matthew,  DPM;  Location: [facility] ORS;  Service: Podiatry;  Laterality: Right;  . CARDIAC CATHETERIZATION N/A 11/12/2015   Procedure: Left Heart Cath and Coronary Angiography;  Surgeon: Marcina Millard, MD;  Location: [facility] INVASIVE CV LAB;  Service: Cardiovascular;  Laterality: N/A;  . COLONOSCOPY WITH PROPOFOL N/A  07/20/2017   Procedure: COLONOSCOPY WITH PROPOFOL;  Surgeon: Wyline Mood, MD;  Location: [facility] ENDOSCOPY;  Service: Endoscopy;  Laterality: N/A;  . JOINT REPLACEMENT    . TOE AMPUTATION      Allergies Ampicillin; Bactrim [sulfamethoxazole-trimethoprim]; Codeine; Flu virus vaccine; Influenza vaccines; Methadone; Oxycodone-acetaminophen; Percocet [oxycodone-acetaminophen]; Sulfa antibiotics; Tetanus antitoxin; Tetanus toxoids; Penicillin g; and Tetracyclines & related  Social History Social History   Tobacco Use  . Smoking status: Never Smoker  . Smokeless tobacco: Never Used  Substance Use Topics  . Alcohol use: No  . Drug use: No   Review of Systems Constitutional: Negative for fever. Cardiovascular: Negative for chest pain. Respiratory: Negative for shortness of breath. Gastrointestinal: Negative for abdominal pain, vomiting and diarrhea. Musculoskeletal: Positive for back pain Skin: Negative for rash. Neurological: Positive for weakness  All systems negative/normal/unremarkable except as stated in the HPI  ____________________________________________   PHYSICAL EXAM:  VITAL SIGNS: ED Triage Vitals  Enc Vitals Group     BP 05/10/19 1215 (!) 141/69     Pulse Rate 05/10/19 1215 (!) 56     Resp 05/10/19 1215 (!) 24     Temp 05/10/19 1215 (!) 96.6 F (35.9 C)     Temp Source 05/10/19 1215 Axillary     SpO2 05/10/19 1215 96 %     Weight 05/10/19 1217 170 lb (77.1 kg)     Height 05/10/19 1217 5\' 8"  (1.727 m)     Head Circumference --      Peak Flow --      Pain Score 05/10/19 1216 8     Pain Loc --      Pain Edu? --      Excl. in GC? --     Constitutional: Drowsy but responds to verbal stimuli, mild distress Eyes: Conjunctivae are normal. Normal extraocular movements. ENT      Head: Normocephalic and atraumatic.      Nose: No congestion/rhinnorhea.      Mouth/Throat: Mucous membranes are moist.      Neck: No stridor. Cardiovascular: Normal rate, regular rhythm.  No murmurs, rubs, or gallops. Respiratory: Normal respiratory effort without tachypnea nor retractions. Breath sounds are clear and equal bilaterally. No wheezes/rales/rhonchi. Gastrointestinal: Soft and nontender. Normal bowel sounds Musculoskeletal: Nontender with normal range of motion in extremities. No lower extremity tenderness nor edema. Neurologic: Slurred speech, no gross focal neurologic deficits are appreciated.  Skin:  Skin is warm, dry and intact. No rash noted. Psychiatric: Drowsy with flat affect ____________________________________________  ED COURSE:  As part of my medical decision making, I reviewed the following data within the electronic MEDICAL RECORD NUMBER History obtained from family if available, nursing notes, old chart and ekg, as well as notes from prior ED visits. Patient presented for altered mental status, we will assess with labs and imaging as indicated at this time.   Procedures  Jessica Donovan was evaluated in Emergency Department on 05/10/2019 for the symptoms described in the history of present illness. She was evaluated in the context of the global COVID-19 pandemic, which necessitated consideration that the patient might be at risk for infection with the SARS-CoV-2 virus that causes COVID-19. Institutional protocols and algorithms that pertain to the evaluation  of patients at risk for COVID-19 are in a state of rapid change based on information released by regulatory bodies including the CDC and federal and state organizations. These policies and algorithms were followed during the patient's care in the ED.  ____________________________________________   LABS (pertinent positives/negatives)  Labs Reviewed  CBC WITH DIFFERENTIAL/PLATELET - Abnormal; Notable for the following components:      Result Value   MCV 101.0 (*)    All other components within normal limits  COMPREHENSIVE METABOLIC PANEL - Abnormal; Notable for the following components:   Sodium 134  (*)    Glucose, Bld 206 (*)    BUN 25 (*)    All other components within normal limits  URINALYSIS, COMPLETE (UACMP) WITH MICROSCOPIC - Abnormal; Notable for the following components:   Color, Urine YELLOW (*)    APPearance CLEAR (*)    All other components within normal limits  URINE DRUG SCREEN, QUALITATIVE ([facility] ONLY) - Abnormal; Notable for the following components:   Tricyclic, Ur Screen POSITIVE (*)    Opiate, Ur Screen POSITIVE (*)    Benzodiazepine, Ur Scrn POSITIVE (*)    All other components within normal limits  GLUCOSE, CAPILLARY - Abnormal; Notable for the following components:   Glucose-Capillary 207 (*)    All other components within normal limits  ETHANOL  TROPONIN I  CBG MONITORING, ED    RADIOLOGY Images were viewed by me  CT head, chest x-ray IMPRESSION: No acute intracranial pathology. IMPRESSION: No active disease.  ____________________________________________   DIFFERENTIAL DIAGNOSIS   Medication side effect, occult infection, dehydration, electrolyte abnormality, CVA, MI  FINAL ASSESSMENT AND PLAN  Altered mental status, accidental overdose   Plan: The patient had presented for altered mental status with concerns for overdose. Patient's labs did not reveal any acute process. Patient's imaging was unremarkable as well.  Patient appears to be overmedicated, we will give her Narcan with improvement in her mental status.  I am concerned about her living conditions, she lives alone and apparently overmedicates.  She is adamant that she does not want to hurt herself.  I will consult social work for evaluation.   Ulice Dash, MD    Note: This note was generated in part or whole with voice recognition software. Voice recognition is usually quite accurate but there are transcription errors that can and very often do occur. I apologize for any typographical errors that were not detected and corrected.     Emily Filbert, MD 05/10/19  [phone]

## 2019-05-11 LAB — GLUCOSE, CAPILLARY: Glucose-Capillary: 195 mg/dL — ABNORMAL HIGH (ref 70–99)

## 2019-05-11 MED ORDER — ACETAMINOPHEN 325 MG PO TABS
650.0000 mg | ORAL_TABLET | Freq: Once | ORAL | Status: AC
  Administered 2019-05-11: 650 mg via ORAL
  Filled 2019-05-11: qty 2

## 2019-05-11 MED ORDER — MIRTAZAPINE 15 MG PO TABS: 15.0000 mg | ORAL_TABLET | Freq: Every day | ORAL | Status: DC

## 2019-05-11 MED ORDER — MIRTAZAPINE 15 MG PO TABS
15.0000 mg | ORAL_TABLET | Freq: Every day | ORAL | Status: DC
  Administered 2019-05-11: 04:00:00 15 mg via ORAL
  Filled 2019-05-11: qty 1

## 2019-05-11 NOTE — TOC Transition Note (Signed)
Transition of Care [facility]) - CM/SW Discharge Note   Patient Details  Name: Jessica Donovan MRN: [id number] Date of Birth: Oct 08, 1946  [facility]) CM/SW Contact:  Cala Bradford, LCSW Phone Number: 05/11/2019, 10:06 AM   Clinical Narrative:    CSW notified that pt is medically cleared to be discharged. [facility] has been set up for pt.    Final next level of care: Home w Home Health Services Barriers to Discharge: No Barriers Identified   Patient Goals and CMS Choice   CMS Medicare.gov Compare Post Acute Care list provided to:: Patient Represenative (must comment)(Anna Green ) Choice offered to / list presented to : [facility] POA / Candyce Churn )  Discharge Placement                  Name of family member notified: NA    Discharge Plan and Services In-house Referral: Clinical Social Work Discharge Planning Services: CM Consult Post Acute Care Choice: Home Health                    HH Arranged: RN, Nurse's Aide   Date HH Agency Contacted: 05/10/19 Time HH Agency Contacted: 1625 Representative spoke with at [facility] Agency: Feliberto Gottron  Social Determinants of Health (SDOH) Interventions     Readmission Risk Interventions No flowsheet data found.

## 2019-05-11 NOTE — ED Provider Notes (Signed)
Social work has arranged home health care for the patient.  Patient is at her baseline, labs imaging reviewed and reassuring   Jene Every, MD 05/11/19 1000

## 2019-05-11 NOTE — ED Notes (Addendum)
PT assisted to restroom and moved into hospital bed. Bed alarm in place

## 2019-05-11 NOTE — ED Notes (Signed)
Fall bracelet and yellow socks applied 

## 2019-05-11 NOTE — ED Notes (Signed)
Pharmacy contacted to do a med rec, will give patient's home meds following

## 2019-06-06 ENCOUNTER — Other Ambulatory Visit: Payer: Self-pay

## 2019-06-06 ENCOUNTER — Encounter: Payer: Self-pay | Admitting: Urology

## 2019-06-06 ENCOUNTER — Ambulatory Visit (INDEPENDENT_AMBULATORY_CARE_PROVIDER_SITE_OTHER): Payer: Medicare HMO | Admitting: Urology

## 2019-06-06 VITALS — BP 126/66 | HR 87 | Ht 68.0 in

## 2019-06-06 DIAGNOSIS — N952 Postmenopausal atrophic vaginitis: Secondary | ICD-10-CM | POA: Diagnosis not present

## 2019-06-06 DIAGNOSIS — N3281 Overactive bladder: Secondary | ICD-10-CM

## 2019-06-06 DIAGNOSIS — N390 Urinary tract infection, site not specified: Secondary | ICD-10-CM | POA: Diagnosis not present

## 2019-06-06 LAB — BLADDER SCAN AMB NON-IMAGING

## 2019-06-06 NOTE — Progress Notes (Signed)
06/06/2019 4:42 PM   Jessica DanceBarbara Donovan 06/12/1946 [id number]  Referring provider: Garlon HatchetMcConville, Robert, MD No address on file  Chief Complaint  Patient presents with  . Recurrent UTI    HPI: 73 year old female with complex GU history as per previous notes he returns today for routine six-month follow-up.  Last visit, she was encouraged to start on topical estrogen cream 3 times a week for UTI prevention.  She went to fill this prescription but was unable to afford it due to very high co-pay.  She did not call our office to let us know.  Likely in the interim, she is had no UTI symptoms or diagnosed with any Neri tract infections.  She is been doing relatively well from that standpoint.  She has implemented taking cranberry Gummies, probiotics, and drinking cranberry juice daily.  She also has a personal history of OAB and was on chronic Toviaz.  At last visit, she was advised to stop taking this medication and see how it goes.  Today she reports initially that she has been taking this medication continuously and did not recall as talking about having a break in the medicine.  Later during the conversation, she reports that she is been out of medication for the last 6 months and just got the medication in yesterday.  She does think Gala Murdochoviaz helps her with her urgency and frequency.  It gives her more time to get to the bathroom on time.  She does report today that over the past 2 months, she is had a weaker stream.  She does believe that she is emptying her bladder for the most part.  PVR today was minimal.     PMH: Past Medical History:  Diagnosis Date  . Anxiety   . Chronic back pain   . Collagen vascular disease (HCC)   . Coronary artery disease    a. s/p PCI/DES to dRCA & mLCx in 2014 with repeat LHC in 10/2015 showing patent stents  . Diabetes mellitus without complication (HCC)   . Hypertension   . Low back pain   . Myocardial infarction (HCC)   . Osteomyelitis of toe (HCC)  01/23/2016  . Stroke [facility](HCC)     Surgical History: Past Surgical History:  Procedure Laterality Date  . AMPUTATION TOE Left 11/10/2015   Procedure: AMPUTATION TOE;  Surgeon: Linus Galasodd Cline, MD;  Location: [facility] ORS;  Service: Podiatry;  Laterality: Left;  . AMPUTATION TOE Left 01/24/2016   Procedure: AMPUTATION TOE (2nd mpj);  Surgeon: Linus Galasodd Cline, DPM;  Location: [facility] ORS;  Service: Podiatry;  Laterality: Left;  . AMPUTATION TOE Right 12/21/2018   Procedure: 2ND TOE AMPUTATION WITH DEBRIDEMENT OF SOFT TISSUE;  Surgeon: Recardo Evangelistroxler, Matthew, DPM;  Location: [facility] ORS;  Service: Podiatry;  Laterality: Right;  . CARDIAC CATHETERIZATION N/A 11/12/2015   Procedure: Left Heart Cath and Coronary Angiography;  Surgeon: Marcina MillardAlexander Paraschos, MD;  Location: [facility] INVASIVE CV LAB;  Service: Cardiovascular;  Laterality: N/A;  . COLONOSCOPY WITH PROPOFOL N/A 07/20/2017   Procedure: COLONOSCOPY WITH PROPOFOL;  Surgeon: Wyline MoodAnna, Kiran, MD;  [facility];  Service: Endoscopy;  Laterality: N/A;  . JOINT REPLACEMENT    . TOE AMPUTATION      Home Medications:  Allergies as of 06/06/2019      Reactions   Ampicillin Nausea And Vomiting   Bactrim [sulfamethoxazole-trimethoprim]    Codeine Nausea And Vomiting   Flu Virus Vaccine    Other reaction(s): Other (See Comments) Passed out for 12 days   Influenza Vaccines  Other (See Comments)   Reaction:  Caused pt to pass out    Methadone Hives, Itching   Oxycodone-acetaminophen Nausea And Vomiting   Percocet [oxycodone-acetaminophen] Nausea And Vomiting   Sulfa Antibiotics    Tetanus Antitoxin    Tetanus Toxoids Swelling, Other (See Comments)   Reaction:  Swelling at injection site   Penicillin G Rash   Has patient had a PCN reaction causing immediate rash, facial/tongue/throat swelling, SOB or lightheadedness with hypotension: Yes Has patient had a PCN reaction causing severe rash involving mucus membranes or skin necrosis: No Has patient had a PCN reaction that  required hospitalization: No Has patient had a PCN reaction occurring within the last 10 years: No If all of the above answers are "NO", then may proceed with Cephalosporin use..   Tetracyclines & Related Rash      Medication List       Accurate as of June 06, 2019  4:42 PM. If you have any questions, ask your nurse or doctor.        acetaminophen 500 MG tablet Commonly known as: TYLENOL Take 1,000 mg by mouth every 8 (eight) hours as needed for mild pain or headache.   ALPRAZolam 1 MG tablet Commonly known as: XANAX Take 1 tablet (1 mg total) by mouth 3 (three) times daily.   aspirin 81 MG tablet Take 81 mg by mouth daily.   atorvastatin 20 MG tablet Commonly known as: LIPITOR Take 20 mg by mouth daily.   baclofen 10 MG tablet Commonly known as: LIORESAL Take 5 mg by mouth 3 (three) times daily. As directed   cyclobenzaprine 10 MG tablet Commonly known as: FLEXERIL Take 1 tablet (10 mg total) by mouth 3 (three) times daily as needed for muscle spasms.   diphenoxylate-atropine 2.5-0.025 MG tablet Commonly known as: LOMOTIL Take 1 tablet by mouth every 4 (four) hours as needed for diarrhea or loose stools.   ferrous sulfate 325 (65 FE) MG tablet Take 325 mg by mouth daily with breakfast.   insulin aspart 100 UNIT/ML injection Commonly known as: novoLOG Inject 3-15 Units into the skin 3 (three) times daily with meals as needed for high blood sugar. Pt uses as needed per sliding scale:    Less than 140:  0 units  140-180:  3 units 181-220:  4 units 221- 260:  6 units 261- 320:  8 units 321-360:  10 units 361-400:  12 units Greater than 400:  15 units   Insulin Glargine 300 UNIT/ML Sopn Commonly known as: Toujeo SoloStar Inject 15 Units into the skin daily. What changed: additional instructions   isosorbide mononitrate 30 MG 24 hr tablet Commonly known as: IMDUR Take 30 mg by mouth daily.   lisinopril 40 MG tablet Commonly known as: ZESTRIL Take 1 tablet  (40 mg total) by mouth daily. What changed:   how much to take  when to take this   metoprolol tartrate 25 MG tablet Commonly known as: LOPRESSOR Take 25 mg by mouth 2 (two) times daily.   mirtazapine 15 MG tablet Commonly known as: REMERON Take 15 mg by mouth at bedtime.   morphine 15 MG tablet Commonly known as: MSIR Take 1 tablet (15 mg total) by mouth 3 (three) times daily. What changed:   when to take this  reasons to take this   nitrofurantoin 50 MG capsule Commonly known as: MACRODANTIN Take 50 mg by mouth daily. As directed   omeprazole 20 MG capsule Commonly known as: PRILOSEC Take 20  mg by mouth daily.   pregabalin 150 MG capsule Commonly known as: LYRICA Take 150 mg by mouth 2 (two) times daily.   promethazine 25 MG tablet Commonly known as: PHENERGAN Take 25 mg by mouth every 6 (six) hours as needed for nausea.   Toviaz 8 MG Tb24 tablet Generic drug: fesoterodine Take 8 mg by mouth daily.   traZODone 100 MG tablet Commonly known as: DESYREL Take 100 mg by mouth at bedtime.   venlafaxine 75 MG tablet Commonly known as: EFFEXOR Take 75 mg by mouth 3 (three) times daily with meals.   Vitamin D (Ergocalciferol) 1.25 MG (50000 UT) Caps capsule Commonly known as: DRISDOL Take 50,000 Units by mouth every Sunday.       Allergies:  Allergies  Allergen Reactions  . Ampicillin Nausea And Vomiting  . Bactrim [Sulfamethoxazole-Trimethoprim]   . Codeine Nausea And Vomiting  . Flu Virus Vaccine     Other reaction(s): Other (See Comments) Passed out for 12 days  . Influenza Vaccines Other (See Comments)    Reaction:  Caused pt to pass out   . Methadone Hives and Itching  . Oxycodone-Acetaminophen Nausea And Vomiting  . Percocet [Oxycodone-Acetaminophen] Nausea And Vomiting  . Sulfa Antibiotics   . Tetanus Antitoxin   . Tetanus Toxoids Swelling and Other (See Comments)    Reaction:  Swelling at injection site  . Penicillin G Rash    Has patient  had a PCN reaction causing immediate rash, facial/tongue/throat swelling, SOB or lightheadedness with hypotension: Yes Has patient had a PCN reaction causing severe rash involving mucus membranes or skin necrosis: No Has patient had a PCN reaction that required hospitalization: No Has patient had a PCN reaction occurring within the last 10 years: No If all of the above answers are "NO", then may proceed with Cephalosporin use..  . Tetracyclines & Related Rash    Family History: Family History  Problem Relation Age of Onset  . CAD Mother   . CAD Father     Social History:  reports that she has never smoked. She has never used smokeless tobacco. She reports that she does not drink alcohol or use drugs.  ROS: UROLOGY Frequent Urination?: Yes Hard to postpone urination?: Yes Burning/pain with urination?: Yes Get up at night to urinate?: Yes Leakage of urine?: Yes Urine stream starts and stops?: No Trouble starting stream?: No Do you have to strain to urinate?: No Blood in urine?: No Urinary tract infection?: No Sexually transmitted disease?: No Injury to kidneys or bladder?: No Painful intercourse?: No Weak stream?: Yes Currently pregnant?: No Vaginal bleeding?: No Last menstrual period?: n  Gastrointestinal Nausea?: No Vomiting?: No Indigestion/heartburn?: No Diarrhea?: No Constipation?: No  Constitutional Fever: No Night sweats?: No Weight loss?: No Fatigue?: No  Skin Skin rash/lesions?: No Itching?: No  Eyes Blurred vision?: No Double vision?: No  Ears/Nose/Throat Sore throat?: No Sinus problems?: No  Hematologic/Lymphatic Swollen glands?: No Easy bruising?: No  Cardiovascular Leg swelling?: No Chest pain?: No  Respiratory Cough?: No Shortness of breath?: No  Endocrine Excessive thirst?: No  Musculoskeletal Back pain?: No Joint pain?: No  Neurological Headaches?: No Dizziness?: No  Psychologic Depression?: No Anxiety?: No   Physical Exam: BP 126/66   Pulse 87   Ht 5\' 8"  (1.727 m)   BMI 25.85 kg/m   Constitutional:  Alert and oriented, No acute distress. HEENT: [place] AT, moist mucus membranes.  Trachea midline, no masses. Cardiovascular: No clubbing, cyanosis, or edema. Respiratory: Normal respiratory effort, no  increased work of breathing. Skin: No rashes, bruises or suspicious lesions. Neurologic: Grossly intact, no focal deficits, moving all 4 extremities. Psychiatric: Normal mood and affect.  Somewhat tangential.  Laboratory Data: Lab Results  Component Value Date   WBC 7.5 05/10/2019   HGB 13.0 05/10/2019   HCT 40.9 05/10/2019   MCV 101.0 (H) 05/10/2019   PLT 240 05/10/2019    Lab Results  Component Value Date   CREATININE 0.78 05/10/2019   Lab Results  Component Value Date   HGBA1C 7.8 (H) 12/20/2018    Urinalysis Unable to provide urine today  Pertinent Imaging: Results for orders placed or performed in visit on 06/06/19  Bladder Scan (Post Void Residual) in office  Result Value Ref Range   Scan Result 25ML     Assessment & Plan:    1. Recurrent UTI Continuation of above regimen with cranberry tablets, probiotics We have discussed calling her in compounded estrogen to a compounding pharmacy which is more affordable, she is agreeable this plan and will use this medication 3 times per week, reviewed application instructions, all questions answered Advised to come to our office if she has any signs or symptoms of urinary tract infection - Bladder Scan (Post Void Residual) in office  2. Atrophic vaginitis As above  3. OAB (overactive bladder) Patient does desire to resume Gala Murdochoviaz Presumably she is received this medication just yesterday Adequate bladder emptying today, no concern for retention or incomplete emptying   Return in about 1 year (around 06/05/2020) for MD follow up.  Vanna ScotlandAshley Herberth Deharo, MD  [facility] [address], [address]  [place], [place] [address] [phone]

## 2019-06-20 ENCOUNTER — Telehealth: Payer: Self-pay | Admitting: Urology

## 2019-06-20 ENCOUNTER — Telehealth: Payer: Self-pay | Admitting: *Deleted

## 2019-06-20 NOTE — Telephone Encounter (Signed)
Compounded Estrace was called in to [facility] on 06/06/2019 to use 3 days a week. Patient's information was given to the pharmacist to mail out RX.

## 2019-06-20 NOTE — Telephone Encounter (Signed)
Pt. Called to get the number for Pharmacy that our office recommended. I could not find a note about the Pharmacy. Spoke to Kirby who spoke to Byars and found out it was [facility]. I gave the patient the number [phone]

## 2019-08-10 ENCOUNTER — Telehealth: Payer: Self-pay | Admitting: Cardiovascular Disease

## 2019-08-10 NOTE — Telephone Encounter (Signed)
To Dr. Gollan's nurse to review.  

## 2019-08-10 NOTE — Telephone Encounter (Signed)
Patient suffered a stroke on 8/4 and is still having difficulties from it. Patient wants to see only Dr. Rockey Situ and has been scheduled on 9/28 but would like to be brought in sooner due to medical needs. Please Jessica Donovan

## 2019-08-14 NOTE — Telephone Encounter (Signed)
Left voicemail message to confirm appointment tomorrow and to see if she had any further questions or concerns.

## 2019-08-15 ENCOUNTER — Encounter: Payer: Self-pay | Admitting: Cardiovascular Disease

## 2019-08-15 ENCOUNTER — Ambulatory Visit (INDEPENDENT_AMBULATORY_CARE_PROVIDER_SITE_OTHER): Payer: Medicare HMO | Admitting: Cardiovascular Disease

## 2019-08-15 ENCOUNTER — Other Ambulatory Visit: Payer: Self-pay

## 2019-08-15 VITALS — BP 114/70 | HR 76 | Ht 67.5 in | Wt 193.0 lb

## 2019-08-15 DIAGNOSIS — I25118 Atherosclerotic heart disease of native coronary artery with other forms of angina pectoris: Secondary | ICD-10-CM

## 2019-08-15 DIAGNOSIS — F419 Anxiety disorder, unspecified: Secondary | ICD-10-CM

## 2019-08-15 DIAGNOSIS — E1169 Type 2 diabetes mellitus with other specified complication: Secondary | ICD-10-CM

## 2019-08-15 DIAGNOSIS — I259 Chronic ischemic heart disease, unspecified: Secondary | ICD-10-CM | POA: Diagnosis not present

## 2019-08-15 DIAGNOSIS — G4733 Obstructive sleep apnea (adult) (pediatric): Secondary | ICD-10-CM | POA: Diagnosis not present

## 2019-08-15 DIAGNOSIS — M869 Osteomyelitis, unspecified: Secondary | ICD-10-CM

## 2019-08-15 NOTE — Patient Instructions (Signed)

## 2019-08-15 NOTE — Progress Notes (Signed)
Cardiology Office Note  Date:  08/15/2019   ID:  Susy, Flug Nov 27, 1946, MRN [id number]  PCP:  Garlon Hatchet, MD   Chief Complaint  Patient presents with  . Other    Patient c/o Chest pain and swelling in legs. Meds reviewed verbally with patient.     HPI:  73 year old woman  history of  coronary artery disease previous stent to the distal RCA and mid circumflex June 2014,  repeat catheterization November 2016 showing patent stents, Poorly controlled diabetes with wounds requiring amputations of toes Hypertension,  hyperlipidemia,  frequent falls,  anxiety,  chronic back pain,  In hospital 06/10/2017 for chest pain, neg enz and stress test OSA presenting for follow up of her chest pain  "two weeks ago, had a CVA" Poor balance getting out of bed Using her walker for balance "voice changed" Difficulty making the bed  2017 minimal heterogeneous and calcified plaque, ejection fraction was in the range of 55% to 60%.  In the ER 05/10/2019, mental status changes found poorly responsive in her bed by her neighbor. concerns for overdose  Pains in chest on left, 3 to 5 min One month, take NTG SL  Sugars running higher  No strength Can't walk dog  EKG personally reviewed by myself on todays visit Shows normal sinus rhythm rate 76 bpm poor R-wave progression   Prior labs, recent labs not available Total chol 158, LDL 64 HBa1C 7.1 Hemoglobin A1c 6.6 late 2017   seen several cardiologists in [place],  then [place]  On recent hospitalization, requesting [facility]  She reported having chest pain prior to hospital admission Atypical in nature, Concerned that her stents have "fallen out" Cardiac enzymes negative 3 EKG unchanged from baseline  She had Stress test 06/10/2017 Pharmacological myocardial perfusion imaging study with no significant  ischemia Normal wall motion, EF estimated at 90% No EKG changes concerning for ischemia at peak stress or in  recovery. Low risk scan  In follow-up she denies any further chest pain symptoms She does not use her CPAP. Previously told that she was gone a she did not use it   PMH:   has a past medical history of Anxiety, Chronic back pain, Collagen vascular disease (HCC), Coronary artery disease, Diabetes mellitus without complication (HCC), Hypertension, Low back pain, Myocardial infarction [facility]), Osteomyelitis of toe (HCC) (01/23/2016), and Stroke [facility]).  PSH:    Past Surgical History:  Procedure Laterality Date  . AMPUTATION TOE Left 11/10/2015   Procedure: AMPUTATION TOE;  Surgeon: Linus Galas, MD;  Location: [facility] ORS;  Service: Podiatry;  Laterality: Left;  . AMPUTATION TOE Left 01/24/2016   Procedure: AMPUTATION TOE (2nd mpj);  Surgeon: Linus Galas, DPM;  Location: [facility] ORS;  Service: Podiatry;  Laterality: Left;  . AMPUTATION TOE Right 12/21/2018   Procedure: 2ND TOE AMPUTATION WITH DEBRIDEMENT OF SOFT TISSUE;  Surgeon: Recardo Evangelist, DPM;  Location: [facility] ORS;  Service: Podiatry;  Laterality: Right;  . CARDIAC CATHETERIZATION N/A 11/12/2015   Procedure: Left Heart Cath and Coronary Angiography;  Surgeon: Marcina Millard, MD;  Location: [facility] INVASIVE CV LAB;  Service: Cardiovascular;  Laterality: N/A;  . COLONOSCOPY WITH PROPOFOL N/A 07/20/2017   Procedure: COLONOSCOPY WITH PROPOFOL;  Surgeon: Wyline Mood, MD;  Location: [facility] ENDOSCOPY;  Service: Endoscopy;  Laterality: N/A;  . JOINT REPLACEMENT    . TOE AMPUTATION      Current Outpatient Medications  Medication Sig Dispense Refill  . acetaminophen (TYLENOL) 500 MG tablet Take 1,000 mg by mouth every 8 (eight)  hours as needed for mild pain or headache.     . ALPRAZolam (XANAX) 1 MG tablet Take 1 tablet (1 mg total) by mouth 3 (three) times daily. 15 tablet 0  . aspirin 81 MG tablet Take 81 mg by mouth daily.    [name] atorvastatin (LIPITOR) 20 MG tablet Take 20 mg by mouth daily.     . baclofen (LIORESAL) 10 MG tablet Take 5 mg by mouth 3  (three) times daily. As directed    . cyclobenzaprine (FLEXERIL) 10 MG tablet Take 1 tablet (10 mg total) by mouth 3 (three) times daily as needed for muscle spasms. 20 tablet 0  . diphenoxylate-atropine (LOMOTIL) 2.5-0.025 MG tablet Take 1 tablet by mouth every 4 (four) hours as needed for diarrhea or loose stools.     . ferrous sulfate 325 (65 FE) MG tablet Take 325 mg by mouth daily with breakfast.    . fesoterodine (TOVIAZ) 8 MG TB24 tablet Take 8 mg by mouth daily.    . insulin aspart (NOVOLOG) 100 UNIT/ML injection Inject 3-15 Units into the skin 3 (three) times daily with meals as needed for high blood sugar. Pt uses as needed per sliding scale:    Less than 140:  0 units  140-180:  3 units 181-220:  4 units 221- 260:  6 units 261- 320:  8 units 321-360:  10 units 361-400:  12 units Greater than 400:  15 units    . Insulin Glargine (TOUJEO SOLOSTAR) 300 UNIT/ML SOPN Inject 15 Units into the skin daily. (Patient taking differently: Inject 15 Units into the skin daily. Patinet reports taking 50 units/day of Toujo if glucose > 130 and if glucose > 140 she takes Novolog SSI in addtiion to the Toujeo  - 09/30/18.  Checks glucose twice daily.) 1 pen 0  . isosorbide mononitrate (IMDUR) 30 MG 24 hr tablet Take 30 mg by mouth daily.     [name] lisinopril (PRINIVIL,ZESTRIL) 40 MG tablet Take 1 tablet (40 mg total) by mouth daily. (Patient taking differently: Take 20 mg by mouth 2 (two) times daily. ) 30 tablet 0  . metoprolol tartrate (LOPRESSOR) 25 MG tablet Take 25 mg by mouth 2 (two) times daily.    . mirtazapine (REMERON) 15 MG tablet Take 15 mg by mouth at bedtime.    [name] morphine (MSIR) 15 MG tablet Take 1 tablet (15 mg total) by mouth 3 (three) times daily. (Patient taking differently: Take 15 mg by mouth every 4 (four) hours as needed (pain). ) 20 tablet 0  . nitrofurantoin (MACRODANTIN) 50 MG capsule Take 50 mg by mouth daily. As directed    . omeprazole (PRILOSEC) 20 MG capsule Take 20 mg by  mouth daily.    . pregabalin (LYRICA) 150 MG capsule Take 150 mg by mouth 2 (two) times daily.    . promethazine (PHENERGAN) 25 MG tablet Take 25 mg by mouth every 6 (six) hours as needed for nausea.    . traZODone (DESYREL) 100 MG tablet Take 100 mg by mouth at bedtime.    [name] venlafaxine (EFFEXOR) 75 MG tablet Take 75 mg by mouth 3 (three) times daily with meals.    . Vitamin D, Ergocalciferol, (DRISDOL) 50000 units CAPS capsule Take 50,000 Units by mouth every Sunday.      No current facility-administered medications for this visit.      Allergies:   Ampicillin, Bactrim [sulfamethoxazole-trimethoprim], Codeine, Flu virus vaccine, Influenza vaccines, Methadone, Oxycodone-acetaminophen, Percocet [oxycodone-acetaminophen], Sulfa antibiotics, Tetanus antitoxin, Tetanus toxoids,  Penicillin g, and Tetracyclines & related   Social History:  The patient  reports that she has never smoked. She has never used smokeless tobacco. She reports that she does not drink alcohol or use drugs.   Family History:   family history includes CAD in her father and mother.    Review of Systems: Review of Systems  Constitutional: Negative.   Respiratory: Negative.   Cardiovascular: Negative.   Gastrointestinal: Negative.   Musculoskeletal: Negative.   Neurological: Negative.   Psychiatric/Behavioral: Negative.   All other systems reviewed and are negative.    PHYSICAL EXAM: VS:  BP 114/70 (BP Location: Right Arm, Patient Position: Sitting, Cuff Size: Normal)   Pulse 76   Ht 5' 7.5" (1.715 m)   Wt 193 lb (87.5 kg)   BMI 29.78 kg/m  , BMI Body mass index is 29.78 kg/m. GEN: Well nourished, well developed, in no acute distress  HEENT: normal  Neck: no JVD, carotid bruits, or masses Cardiac: RRR; no murmurs, rubs, or gallops,no edema  Respiratory:  clear to auscultation bilaterally, normal work of breathing GI: soft, nontender, nondistended, + BS MS: no deformity or atrophy  Skin: warm and dry, no  rash Neuro:  Strength and sensation are intact Psych: euthymic mood, full affect   Recent Labs: 12/23/2018: Magnesium 2.1 05/10/2019: ALT 18; BUN 25; Creatinine, Ser 0.78; Hemoglobin 13.0; Platelets 240; Potassium 4.6; Sodium 134    Lipid Panel Lab Results  Component Value Date   CHOL 158 06/09/2017   HDL 46 06/09/2017   LDLCALC 64 06/09/2017   TRIG 242 (H) 06/09/2017      Wt Readings from Last 3 Encounters:  08/15/19 193 lb (87.5 kg)  05/10/19 170 lb (77.1 kg)  12/19/18 181 lb 3.5 oz (82.2 kg)       ASSESSMENT AND PLAN:  Angina effort (HCC) stable anginal symptoms Prior stress test showing no ischemia, no further workup at this time Continue to take NTG History of chronic chest discomfort with negative work-up  Coronary artery disease 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 Reassurance provided  Suggested she could take nitro as needed  Anxiety Followed by primary care Notes not available  Frequent falls/balance problems Neuro exam grossly normal on today's visit She reports that she had a stroke, unable to exclude a TIA but no clear signs of this Walking with a walker at baseline, has frequent falls, poor balance Ideally needs rehab.  If symptoms continue to progress may not be able to live at home by herself  Diabetes type 2 Stressed importance of aggressive diet control Follow-up with primary care for routine lab work Weight is trending up  Numerous issues, complaints, each item discussed in detail on today's visit  Total encounter time more than 45 minutes  Greater than 50% was spent in counseling and coordination of care with the  patient     Orders Placed This Encounter  Procedures  . EKG 12-Lead     Signed, Dossie Arbourim , M.D., Ph.D. 08/15/2019  [facility], [place] [phone]

## 2019-08-15 NOTE — Telephone Encounter (Signed)
Left voicemail message on both lines to confirm appt today.

## 2019-08-15 NOTE — Telephone Encounter (Signed)
Patient has appointment today and arrived.

## 2019-08-17 ENCOUNTER — Telehealth: Payer: Self-pay | Admitting: Cardiovascular Disease

## 2019-08-17 NOTE — Telephone Encounter (Signed)
Patient calling in post visit because she forgot to mention recent stroke activity. Patient had stroke around 8/11 or 8/12. Patient fell forward in bathroom and hit her head on the floor, had an opening an inch open bleeding as well as a bruise on head for over a week. Patient is saying that she still feels that she is not remembering everything since the event. Patient states she didn't get a chance to tell him this information and she wanted him to know. Patient also says that her heart stops 95-97 times per hour per previous sleep study.

## 2019-08-17 NOTE — Telephone Encounter (Signed)
She mentioned having a fall and thought that she had had a stroke but details were very unclear and did not support a stroke She has very weak legs, frequent falls She is aware that she needs to use her walker at all times

## 2019-08-17 NOTE — Telephone Encounter (Signed)
To Dr. Gollan.  

## 2019-08-22 NOTE — Telephone Encounter (Signed)
I called and spoke with the patient.  She is aware Dr. Rockey Situ reviewed her message and was aware of her falls and her fall risk.  She confirms she does use her walker.  I have advised she use this every time she gets up and to get up slowly and take a minute to stand before walking.   She confirms that she has friends who check on her often. I inquired if she had a Life Alert. She states she had this where she lived before and got rid of it. I advised since she is living alone to seriously consider getting this again.   The patient voices understanding and is agreeable.

## 2019-08-31 ENCOUNTER — Telehealth: Payer: Self-pay | Admitting: Cardiovascular Disease

## 2019-08-31 NOTE — Telephone Encounter (Signed)
Spoke with patient and she needs to get her medications straightened out and then she also has a wound on her left heel which she needs help with. She wants to know if we can help her with home health orders for this assistance. Reviewed that her primary care provider should be the one that could help her with this. Let her know that I would give her PCP a call with this request and they will be in touch. She verbalized understanding with no further questions. She sounded as if she were sleeping but she did verbalize understanding that I would call PCP office for her. Advised to call back if we could assist with anything else.   Called and spoke with Mariann Laster over at [facility] and she will send message to Dr. Delila Spence to get that started.

## 2019-08-31 NOTE — Telephone Encounter (Signed)
Patient wants to know if dr. Rockey Situ will order home health   Patient wants help managing medications and also wound issues  / dressing help

## 2019-09-11 DIAGNOSIS — L97421 Non-pressure chronic ulcer of left heel and midfoot limited to breakdown of skin: Secondary | ICD-10-CM | POA: Insufficient documentation

## 2019-09-11 DIAGNOSIS — E11621 Type 2 diabetes mellitus with foot ulcer: Secondary | ICD-10-CM | POA: Insufficient documentation

## 2019-09-18 ENCOUNTER — Ambulatory Visit: Payer: Medicare HMO | Admitting: Cardiovascular Disease

## 2019-10-20 ENCOUNTER — Emergency Department: Payer: Medicare HMO

## 2019-10-20 ENCOUNTER — Other Ambulatory Visit: Payer: Self-pay

## 2019-10-20 ENCOUNTER — Inpatient Hospital Stay
Admission: EM | Admit: 2019-10-20 | Discharge: 2019-10-24 | DRG: 689 | Disposition: A | Discharge status: Home Health | Payer: Medicare HMO | Attending: Internal Medicine | Admitting: Internal Medicine

## 2019-10-20 ENCOUNTER — Encounter: Payer: Self-pay | Admitting: Radiology

## 2019-10-20 DIAGNOSIS — N390 Urinary tract infection, site not specified: Secondary | ICD-10-CM | POA: Diagnosis present

## 2019-10-20 DIAGNOSIS — E1169 Type 2 diabetes mellitus with other specified complication: Secondary | ICD-10-CM | POA: Diagnosis present

## 2019-10-20 DIAGNOSIS — Z885 Allergy status to narcotic agent status: Secondary | ICD-10-CM

## 2019-10-20 DIAGNOSIS — B9620 Unspecified Escherichia coli [E. coli] as the cause of diseases classified elsewhere: Secondary | ICD-10-CM | POA: Diagnosis present

## 2019-10-20 DIAGNOSIS — L8990 Pressure ulcer of unspecified site, unspecified stage: Secondary | ICD-10-CM | POA: Diagnosis present

## 2019-10-20 DIAGNOSIS — Z9109 Other allergy status, other than to drugs and biological substances: Secondary | ICD-10-CM

## 2019-10-20 DIAGNOSIS — Z89422 Acquired absence of other left toe(s): Secondary | ICD-10-CM

## 2019-10-20 DIAGNOSIS — I10 Essential (primary) hypertension: Secondary | ICD-10-CM | POA: Diagnosis present

## 2019-10-20 DIAGNOSIS — Z8249 Family history of ischemic heart disease and other diseases of the circulatory system: Secondary | ICD-10-CM

## 2019-10-20 DIAGNOSIS — Z79890 Hormone replacement therapy (postmenopausal): Secondary | ICD-10-CM

## 2019-10-20 DIAGNOSIS — Z9861 Coronary angioplasty status: Secondary | ICD-10-CM

## 2019-10-20 DIAGNOSIS — E1140 Type 2 diabetes mellitus with diabetic neuropathy, unspecified: Secondary | ICD-10-CM | POA: Diagnosis present

## 2019-10-20 DIAGNOSIS — G9340 Encephalopathy, unspecified: Secondary | ICD-10-CM | POA: Diagnosis present

## 2019-10-20 DIAGNOSIS — I2510 Atherosclerotic heart disease of native coronary artery without angina pectoris: Secondary | ICD-10-CM | POA: Diagnosis present

## 2019-10-20 DIAGNOSIS — W19XXXA Unspecified fall, initial encounter: Secondary | ICD-10-CM

## 2019-10-20 DIAGNOSIS — Z20828 Contact with and (suspected) exposure to other viral communicable diseases: Secondary | ICD-10-CM | POA: Diagnosis present

## 2019-10-20 DIAGNOSIS — N3000 Acute cystitis without hematuria: Secondary | ICD-10-CM | POA: Diagnosis not present

## 2019-10-20 DIAGNOSIS — G9341 Metabolic encephalopathy: Secondary | ICD-10-CM | POA: Diagnosis present

## 2019-10-20 DIAGNOSIS — Z794 Long term (current) use of insulin: Secondary | ICD-10-CM

## 2019-10-20 DIAGNOSIS — M549 Dorsalgia, unspecified: Secondary | ICD-10-CM | POA: Diagnosis present

## 2019-10-20 DIAGNOSIS — G8929 Other chronic pain: Secondary | ICD-10-CM | POA: Diagnosis present

## 2019-10-20 DIAGNOSIS — Z8673 Personal history of transient ischemic attack (TIA), and cerebral infarction without residual deficits: Secondary | ICD-10-CM

## 2019-10-20 DIAGNOSIS — R5381 Other malaise: Secondary | ICD-10-CM | POA: Diagnosis present

## 2019-10-20 DIAGNOSIS — I252 Old myocardial infarction: Secondary | ICD-10-CM

## 2019-10-20 DIAGNOSIS — Z79899 Other long term (current) drug therapy: Secondary | ICD-10-CM

## 2019-10-20 DIAGNOSIS — Z880 Allergy status to penicillin: Secondary | ICD-10-CM

## 2019-10-20 DIAGNOSIS — Z66 Do not resuscitate: Secondary | ICD-10-CM | POA: Diagnosis present

## 2019-10-20 DIAGNOSIS — Z7982 Long term (current) use of aspirin: Secondary | ICD-10-CM

## 2019-10-20 DIAGNOSIS — Z881 Allergy status to other antibiotic agents status: Secondary | ICD-10-CM

## 2019-10-20 DIAGNOSIS — T50901A Poisoning by unspecified drugs, medicaments and biological substances, accidental (unintentional), initial encounter: Secondary | ICD-10-CM

## 2019-10-20 DIAGNOSIS — F419 Anxiety disorder, unspecified: Secondary | ICD-10-CM | POA: Diagnosis present

## 2019-10-20 LAB — CBC WITH DIFFERENTIAL/PLATELET
Abs Immature Granulocytes: 0.06 10*3/uL (ref 0.00–0.07)
Basophils Absolute: 0.1 10*3/uL (ref 0.0–0.1)
Basophils Relative: 0 %
Eosinophils Absolute: 0 10*3/uL (ref 0.0–0.5)
Eosinophils Relative: 0 %
HCT: 38.7 % (ref 36.0–46.0)
Hemoglobin: 12.4 g/dL (ref 12.0–15.0)
Immature Granulocytes: 0 %
Lymphocytes Relative: 16 %
Lymphs Abs: 2.4 10*3/uL (ref 0.7–4.0)
MCH: 31.6 pg (ref 26.0–34.0)
MCHC: 32 g/dL (ref 30.0–36.0)
MCV: 98.5 fL (ref 80.0–100.0)
Monocytes Absolute: 1.4 10*3/uL — ABNORMAL HIGH (ref 0.1–1.0)
Monocytes Relative: 9 %
Neutro Abs: 11.1 10*3/uL — ABNORMAL HIGH (ref 1.7–7.7)
Neutrophils Relative %: 75 %
Platelets: 259 10*3/uL (ref 150–400)
RBC: 3.93 MIL/uL (ref 3.87–5.11)
RDW: 12.4 % (ref 11.5–15.5)
WBC: 15.1 10*3/uL — ABNORMAL HIGH (ref 4.0–10.5)
nRBC: 0 % (ref 0.0–0.2)

## 2019-10-20 LAB — COMPREHENSIVE METABOLIC PANEL
ALT: 16 U/L (ref 0–44)
AST: 31 U/L (ref 15–41)
Albumin: 3.7 g/dL (ref 3.5–5.0)
Alkaline Phosphatase: 71 U/L (ref 38–126)
Anion gap: 11 (ref 5–15)
BUN: 33 mg/dL — ABNORMAL HIGH (ref 8–23)
CO2: 24 mmol/L (ref 22–32)
Calcium: 9.9 mg/dL (ref 8.9–10.3)
Chloride: 100 mmol/L (ref 98–111)
Creatinine, Ser: 1.14 mg/dL — ABNORMAL HIGH (ref 0.44–1.00)
GFR calc Af Amer: 55 mL/min — ABNORMAL LOW (ref >60)
GFR calc non Af Amer: 48 mL/min — ABNORMAL LOW (ref >60)
Glucose, Bld: 216 mg/dL — ABNORMAL HIGH (ref 70–99)
Potassium: 4.6 mmol/L (ref 3.5–5.1)
Sodium: 135 mmol/L (ref 135–145)
Total Bilirubin: 0.6 mg/dL (ref 0.3–1.2)
Total Protein: 6.3 g/dL — ABNORMAL LOW (ref 6.5–8.1)

## 2019-10-20 LAB — URINALYSIS, ROUTINE W REFLEX MICROSCOPIC
Bilirubin Urine: NEGATIVE
Glucose, UA: 50 mg/dL — AB
Hgb urine dipstick: NEGATIVE
Ketones, ur: NEGATIVE mg/dL
Nitrite: POSITIVE — AB
Protein, ur: NEGATIVE mg/dL
Specific Gravity, Urine: 1.021 (ref 1.005–1.030)
WBC, UA: >50 WBC/hpf — ABNORMAL HIGH (ref 0–5)
pH: 5 (ref 5.0–8.0)

## 2019-10-20 LAB — ETHANOL: Alcohol, Ethyl (B): <10 mg/dL (ref <10)

## 2019-10-20 LAB — URINE DRUG SCREEN, QUALITATIVE (ARMC ONLY)
Amphetamines, Ur Screen: NOT DETECTED
Barbiturates, Ur Screen: NOT DETECTED
Benzodiazepine, Ur Scrn: POSITIVE — AB
Cannabinoid 50 Ng, Ur ~~LOC~~: NOT DETECTED
Cocaine Metabolite,Ur ~~LOC~~: NOT DETECTED
MDMA (Ecstasy)Ur Screen: NOT DETECTED
Methadone Scn, Ur: NOT DETECTED
Opiate, Ur Screen: POSITIVE — AB
Phencyclidine (PCP) Ur S: NOT DETECTED
Tricyclic, Ur Screen: POSITIVE — AB

## 2019-10-20 LAB — PROTIME-INR
INR: 1.1 (ref 0.8–1.2)
Prothrombin Time: 14.2 seconds (ref 11.4–15.2)

## 2019-10-20 LAB — APTT: aPTT: 29 seconds (ref 24–36)

## 2019-10-20 LAB — SALICYLATE LEVEL: Salicylate Lvl: <7 mg/dL (ref 2.8–30.0)

## 2019-10-20 LAB — ACETAMINOPHEN LEVEL: Acetaminophen (Tylenol), Serum: <10 ug/mL — ABNORMAL LOW (ref 10–30)

## 2019-10-20 LAB — TROPONIN I (HIGH SENSITIVITY): Troponin I (High Sensitivity): 8 ng/L (ref <18)

## 2019-10-20 LAB — CK: Total CK: 612 U/L — ABNORMAL HIGH (ref 38–234)

## 2019-10-20 MED ORDER — NALOXONE HCL 2 MG/2ML IJ SOSY
PREFILLED_SYRINGE | INTRAMUSCULAR | Status: AC
  Administered 2019-10-20: 20:00:00 0.4 mg via INTRAVENOUS
  Filled 2019-10-20: qty 2

## 2019-10-20 MED ORDER — SODIUM CHLORIDE 0.9 % IV BOLUS
1000.0000 mL | Freq: Once | INTRAVENOUS | Status: AC
  Administered 2019-10-20: 22:00:00 1000 mL via INTRAVENOUS

## 2019-10-20 MED ORDER — NALOXONE HCL 2 MG/2ML IJ SOSY
0.4000 mg | PREFILLED_SYRINGE | Freq: Once | INTRAMUSCULAR | Status: AC
  Administered 2019-10-20: 20:00:00 0.4 mg via INTRAVENOUS

## 2019-10-20 MED ORDER — SODIUM CHLORIDE 0.9 % IV SOLN
1.0000 g | Freq: Once | INTRAVENOUS | Status: AC
  Administered 2019-10-21: 1 g via INTRAVENOUS
  Filled 2019-10-20: qty 10

## 2019-10-20 MED ORDER — NALOXONE HCL 0.4 MG/ML IJ SOLN: 0.2000 mg | Freq: Once | INTRAMUSCULAR | Status: DC

## 2019-10-20 MED ORDER — IOHEXOL 300 MG/ML  SOLN
100.0000 mL | Freq: Once | INTRAMUSCULAR | Status: AC | PRN
  Administered 2019-10-20: 100 mL via INTRAVENOUS

## 2019-10-20 MED ORDER — ONDANSETRON HCL 4 MG/2ML IJ SOLN
4.0000 mg | Freq: Once | INTRAMUSCULAR | Status: AC
  Administered 2019-10-20: 20:00:00 4 mg via INTRAVENOUS
  Filled 2019-10-20: qty 2

## 2019-10-20 NOTE — ED Notes (Signed)
Patient transported to CT 

## 2019-10-20 NOTE — ED Notes (Signed)
[facility] palced at this time.

## 2019-10-20 NOTE — ED Notes (Signed)
ED Provider Funk at bedside. 

## 2019-10-20 NOTE — ED Provider Notes (Signed)
[facility] Emergency Department Provider Note  ____________________________________________   None    (approximate)  I have reviewed the triage vital signs and the nursing notes.   HISTORY  Chief Complaint Altered Mental Status and Fall    HPI Jessica Donovan is a 73 y.o. female anxiety, chronic back pain, collagen vascular disease, coronary disease, diabetes, hypertension, MI, osteomyelitis who presents to the ED for altered mental status.  Patient was found by her neighbor laying on the ground.  Unclear how long she been down there.  She called EMS.  When EMS got there patient was altered.  Patient is oriented to person and place but is somewhat somnolent and falling asleep during our questions.  Back in May patient was seen for altered mental status and found to be positive for opioids and Flexeril.  Per EMS did not see any medications near her.  Patient at that time got some Narcan and got better.  Unable to get full HPI due to patient's altered mental status.     Past Medical History:  Diagnosis Date  . Anxiety   . Chronic back pain   . Collagen vascular disease (HCC)   . Coronary artery disease    a. s/p PCI/DES to dRCA & mLCx in 2014 with repeat LHC in 10/2015 showing patent stents  . Diabetes mellitus without complication (HCC)   . Hypertension   . Low back pain   . Myocardial infarction (HCC)   . Osteomyelitis of toe (HCC) 01/23/2016  . Stroke [facility])     Patient Active Problem List   Diagnosis Date Noted  . Right foot infection 12/19/2018  . Acute encephalopathy 07/06/2018  . Anxiety 06/10/2017  . Chest pain with moderate risk for cardiac etiology 06/09/2017  . UTI (urinary tract infection) 05/19/2017  . CAD (coronary artery disease) 05/19/2017  . Chronic narcotic use 05/19/2017  . Cellulitis in diabetic foot (HCC) 05/19/2017  . Opioid overdose (HCC) 02/23/2017  . Altered mental status 02/23/2017  . Acute colitis 01/19/2017  . Sepsis  (HCC) 01/17/2017  . Cellulitis of fourth toe of right foot 04/10/2016  . Somnolence 04/09/2016  . Dementia (HCC) 04/09/2016  . Cellulitis 04/03/2016  . Hematoma of right parietal scalp 02/23/2016  . Multiple falls 02/23/2016  . Physical debility 02/23/2016  . Type II diabetes mellitus, uncontrolled (HCC) 02/23/2016  . Syncope and collapse 02/23/2016  . Polypharmacy 02/23/2016  . Osteomyelitis of toe (HCC) 01/23/2016  . Rhabdomyolysis 12/07/2015  . ARF (acute renal failure) (HCC) 11/29/2015  . Hypotension 11/29/2015  . 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 Angina effort 11/07/2015  . Osteomyelitis (HCC) 11/07/2015  . Anemia due to chronic blood loss 06/14/2015  . Generalized anxiety disorder 06/14/2015  . Lumbago with sciatica 06/14/2015  . 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  . 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 Abnormal weight loss 05/18/2014  . Chronic pain syndrome 05/18/2014  . 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 Diabetic polyneuropathy (HCC) 05/18/2014  . Type 2 diabetes mellitus with diabetic neuropathy (HCC) 05/07/2014  . Hemorrhage of rectum and anus 01/17/2014  . Cellulitis and abscess of foot excluding toe 05/18/2012    Past Surgical History:  Procedure Laterality Date  . AMPUTATION TOE Left 11/10/2015   Procedure: AMPUTATION TOE;  Surgeon: Linus Galas, MD;  Location: [facility] ORS;  Service: Podiatry;  Laterality: Left;  . AMPUTATION TOE Left 01/24/2016  Procedure: AMPUTATION TOE (2nd mpj);  Surgeon: Linus Galasodd Cline, DPM;  Location: [facility] ORS;  Service: Podiatry;  Laterality: Left;  . AMPUTATION TOE Right 12/21/2018   Procedure: 2ND TOE AMPUTATION WITH DEBRIDEMENT OF SOFT TISSUE;  Surgeon: Recardo Evangelistroxler, Matthew, DPM;  Location: [facility] ORS;  Service: Podiatry;  Laterality: Right;  . CARDIAC CATHETERIZATION N/A 11/12/2015   Procedure: Left  Heart Cath and Coronary Angiography;  Surgeon: Marcina MillardAlexander Paraschos, MD;  Location: [facility] INVASIVE CV LAB;  Service: Cardiovascular;  Laterality: N/A;  . COLONOSCOPY WITH PROPOFOL N/A 07/20/2017   Procedure: COLONOSCOPY WITH PROPOFOL;  Surgeon: Wyline MoodAnna, Kiran, MD;  [facility];  Service: Endoscopy;  Laterality: N/A;  . JOINT REPLACEMENT    . TOE AMPUTATION      Prior to Admission medications   Medication Sig Start Date End Date Taking? Authorizing Provider  acetaminophen (TYLENOL) 500 MG tablet Take 1,000 mg by mouth every 8 (eight) hours as needed for mild pain or headache.     [provider]  ALPRAZolam Prudy Feeler(XANAX) 1 MG tablet Take 1 tablet (1 mg total) by mouth 3 (three) times daily. 12/23/18   Enid Baasjie, Jude, MD  aspirin 81 MG tablet Take 81 mg by mouth daily.    [provider]  atorvastatin (LIPITOR) 20 MG tablet Take 20 mg by mouth daily.     [provider]  baclofen (LIORESAL) 10 MG tablet Take 5 mg by mouth 3 (three) times daily. As directed    [provider]  cyclobenzaprine (FLEXERIL) 10 MG tablet Take 1 tablet (10 mg total) by mouth 3 (three) times daily as needed for muscle spasms. 01/27/16   Enid BaasKalisetti, Radhika, MD  diphenoxylate-atropine (LOMOTIL) 2.5-0.025 MG tablet Take 1 tablet by mouth every 4 (four) hours as needed for diarrhea or loose stools.     [provider]  ferrous sulfate 325 (65 FE) MG tablet Take 325 mg by mouth daily with breakfast.    [provider]  fesoterodine (TOVIAZ) 8 MG TB24 tablet Take 8 mg by mouth daily.    [provider]  insulin aspart (NOVOLOG) 100 UNIT/ML injection Inject 3-15 Units into the skin 3 (three) times daily with meals as needed for high blood sugar. Pt uses as needed per sliding scale:    Less than 140:  0 units  140-180:  3 units 181-220:  4 units 221- 260:  6 units 261- 320:  8 units 321-360:  10 units 361-400:  12 units Greater than 400:  15 units    [provider]  Insulin Glargine (TOUJEO SOLOSTAR) 300 UNIT/ML SOPN Inject 15 Units into the skin daily. Patient taking differently: Inject 15 Units into the skin daily. Patinet reports taking 50 units/day of Toujo if glucose > 130 and if glucose > 140 she takes Novolog SSI in addtiion to the Toujeo  - 09/30/18.  Checks glucose twice daily. 09/13/18   Altamese DillingVachhani, Vaibhavkumar, MD  isosorbide mononitrate (IMDUR) 30 MG 24 hr tablet Take 30 mg by mouth daily.     [provider]  lisinopril (PRINIVIL,ZESTRIL) 40 MG tablet Take 1 tablet (40 mg total) by mouth daily. Patient taking differently: Take 20 mg by mouth 2 (two) times daily.  09/13/18   Altamese DillingVachhani, Vaibhavkumar, MD  metoprolol tartrate (LOPRESSOR) 25 MG tablet Take 25 mg by mouth 2 (two) times daily. 01/28/17   [provider]  mirtazapine (REMERON) 15 MG tablet Take 15 mg by mouth at bedtime.    [provider]  morphine (MSIR) 15  MG tablet Take 1 tablet (15 mg total) by mouth 3 (three) times daily. Patient taking differently: Take 15 mg by mouth every 4 (four) hours as needed (pain).  12/23/18   Enid Baas Jude, MD  nitrofurantoin (MACRODANTIN) 50 MG capsule Take 50 mg by mouth daily. As directed    [provider]  omeprazole (PRILOSEC) 20 MG capsule Take 20 mg by mouth daily.    [provider]  pregabalin (LYRICA) 150 MG capsule Take 150 mg by mouth 2 (two) times daily.    [provider]  promethazine (PHENERGAN) 25 MG tablet Take 25 mg by mouth every 6 (six) hours as needed for nausea.    [provider]  traZODone (DESYREL) 100 MG tablet Take 100 mg by mouth at bedtime. 01/28/17   [provider]  venlafaxine (EFFEXOR) 75 MG tablet Take 75 mg by mouth 3 (three) times daily with meals.    [provider]  Vitamin D, Ergocalciferol, (DRISDOL) 50000 units CAPS capsule Take 50,000 Units by mouth every Sunday.     [provider]    Allergies Ampicillin, Bactrim  [sulfamethoxazole-trimethoprim], Codeine, Flu virus vaccine, Influenza vaccines, Methadone, Oxycodone-acetaminophen, Percocet [oxycodone-acetaminophen], Sulfa antibiotics, Tetanus antitoxin, Tetanus toxoids, Penicillin g, and Tetracyclines & related  Family History  Problem Relation Age of Onset  . CAD Mother   . CAD Father     Social History Social History   Tobacco Use  . Smoking status: Never Smoker  . Smokeless tobacco: Never Used  Substance Use Topics  . Alcohol use: No  . Drug use: No      Review of Systems Unable to get full review of systems due to patient's altered mental status. ____________________________________________   PHYSICAL EXAM:  VITAL SIGNS: Blood pressure 122/64, pulse 97, temperature 98.5 F (36.9 C), temperature source Axillary, resp. rate 19, height  (1.727 m), weight 88.5 kg, SpO2 96 %.  Constitutional: Patient is altered, eyes are droopy and not very responsive.  Briefly wakes up and answers brief questions.. Eyes: Conjunctivae are normal. EOMI. pupils are equal and reactive Head: Atraumatic. Nose: No congestion/rhinnorhea. Mouth/Throat: Mucous membranes are moist.   Neck: No stridor. Trachea Midline. FROM Cardiovascular: Normal rate, regular rhythm. Grossly normal heart sounds.  Good peripheral circulation. Respiratory: Normal respiratory effort.  No retractions. Lungs CTAB. Gastrointestinal:No distention. No abdominal bruits.  Musculoskeletal: No lower extremity tenderness nor edema.  No joint effusions. Neurologic: Patient is altered.  Answers brief questions. Skin:  Skin is warm, dry and intact. No rash noted. Psychiatric: Unable to fully assess.  Appears intoxicated GU: Deferred   ____________________________________________   LABS (all labs ordered are listed, but only abnormal results are displayed)  Labs Reviewed  CBC WITH DIFFERENTIAL/PLATELET - Abnormal; Notable for the following components:      Result Value   WBC 15.1  (*)    Neutro Abs 11.1 (*)    Monocytes Absolute 1.4 (*)    All other components within normal limits  COMPREHENSIVE METABOLIC PANEL - Abnormal; Notable for the following components:   Glucose, Bld 216 (*)    BUN 33 (*)    Creatinine, Ser 1.14 (*)    Total Protein 6.3 (*)    GFR calc non Af Amer 48 (*)    GFR calc Af Amer 55 (*)    All other components within normal limits  CK - Abnormal; Notable for the following components:   Total CK 612 (*)    All other components within normal limits  URINALYSIS, ROUTINE W REFLEX MICROSCOPIC - Abnormal; Notable for the following components:   Color, Urine YELLOW (*)    APPearance CLOUDY (*)    Glucose, UA 50 (*)    Nitrite POSITIVE (*)    Leukocytes,Ua LARGE (*)    WBC, UA >50 (*)    Bacteria, UA MANY (*)    All other components within normal limits  ACETAMINOPHEN LEVEL - Abnormal; Notable for the following components:   Acetaminophen (Tylenol), Serum <10 (*)    All other components within normal limits  URINE DRUG SCREEN, QUALITATIVE ([facility] ONLY) - Abnormal; Notable for the following components:   Tricyclic, Ur Screen POSITIVE (*)    Opiate, Ur Screen POSITIVE (*)    Benzodiazepine, Ur Scrn POSITIVE (*)    All other components within normal limits  PROTIME-INR  APTT  SALICYLATE LEVEL  ETHANOL  TROPONIN I (HIGH SENSITIVITY)  TROPONIN I (HIGH SENSITIVITY)   ____________________________________________   ED ECG REPORT I, Concha Se, the attending physician, personally viewed and interpreted this ECG.   ____________________________________________  RADIOLOGY  Official radiology report(s): Ct Head Wo Contrast  Result Date: 10/20/2019 CLINICAL DATA:  72 year old female with head trauma. EXAM: CT HEAD WITHOUT CONTRAST CT CERVICAL SPINE WITHOUT CONTRAST TECHNIQUE: Multidetector CT imaging of the head and cervical spine was performed following the standard protocol without intravenous contrast. Multiplanar CT image reconstructions  of the cervical spine were also generated. COMPARISON:  Head CT dated 05/10/2019 FINDINGS: CT HEAD FINDINGS Brain: Mild age-related atrophy and chronic microvascular ischemic changes. There is no acute intracranial hemorrhage. No mass effect or midline shift. No extra-axial fluid collection. Vascular: No hyperdense vessel or unexpected calcification. Skull: Normal. Negative for fracture or focal lesion. Sinuses/Orbits: No acute finding. Other: None CT CERVICAL SPINE FINDINGS Alignment: No acute subluxation Skull base and vertebrae: No acute fracture. Osteopenia. Soft tissues and spinal canal: No prevertebral fluid or swelling. No visible canal hematoma. Disc levels:  No acute findings. Mild degenerative changes. Upper chest: Biapical interstitial streaky densities may represent atelectasis or edema. Other: Bilateral carotid bulb calcified plaques. IMPRESSION: 1. No acute intracranial hemorrhage. Mild age-related atrophy and chronic microvascular ischemic changes. 2. No acute/traumatic cervical spine pathology. Electronically Signed   By: Elgie Collard M.D.   On: 10/20/2019 21:58   Ct Chest W Contrast  Result Date: 10/20/2019 CLINICAL DATA:  Abdominal trauma, blunt, found down, headache and abdominal pain EXAM: CT CHEST, ABDOMEN, AND PELVIS WITH CONTRAST TECHNIQUE: Multidetector CT imaging of the chest, abdomen and pelvis was performed following the standard protocol during bolus administration of intravenous contrast. CONTRAST:  OMNIPAQUE IOHEXOL 300 MG/ML  SOLN COMPARISON:  CTA chest May 19, 2017, CT abdomen pelvis June 22, 2017 FINDINGS: CT CHEST FINDINGS Cardiovascular: The aorta is normal caliber. No acute luminal abnormality of the aorta is seen. No periaortic stranding or hemorrhage. Atherosclerotic calcifications are present throughout the normal caliber aorta. Normal 3 vessel branching of the aortic arch. Minimal plaque within the proximal great vessels. Normal heart size. Trace pericardial  fluid is likely within physiologic normal. Atherosclerotic calcification of the coronary arteries. Central pulmonary arteries are normal caliber. No large central filling defects on this non tailored examination. Mediastinum/Nodes: No mediastinal hematoma or pneumomediastinum. No acute traumatic abnormality of the trachea or esophagus. Thyroid gland and thoracic inlet are unremarkable. No mediastinal, hilar or axillary adenopathy. Lungs/Pleura: Bandlike areas of scarring and/or atelectasis are present in the lung bases and apices. Dependent atelectasis posteriorly. No consolidation, features of edema, pneumothorax, or effusion.  No suspicious pulmonary nodules or masses. Musculoskeletal: No acute osseous injury or suspicious osseous lesion within the chest. Multilevel degenerative changes are present in the thoracic spine. Moderate degenerative changes noted in both shoulders features are not significantly changed from comparison studies. No abnormal chest wall hematoma or traumatic soft tissue injury. CT ABDOMEN PELVIS FINDINGS Hepatobiliary: No hepatic injury or perihepatic hematoma. No focal liver abnormality is seen. Patient is post cholecystectomy. Slight prominence of the biliary tree likely related to reservoir effect. No calcified intraductal gallstones. Pancreas: Partial fatty replacement of the pancreas particularly at the level of the pancreatic head. No pancreatic ductal dilatation or convincing peripancreatic inflammation Spleen: No splenic injury or perisplenic hematoma. No suspicious splenic lesions. Adrenals/Urinary Tract: No adrenal hemorrhage or suspicious adrenal lesions. No direct renal injury or perirenal hemorrhage. No extravasation of contrast is seen on excretory phase delayed imaging. Kidneys are otherwise unremarkable, without renal calculi, suspicious lesion, or hydronephrosis. No evidence of bladder injury. Mild circumferential thickening of the bladder wall with faint perivesicular haze.  Stomach/Bowel: Distal esophagus stomach and duodenal sweep are unremarkable. No small bowel dilatation or wall thickening. A normal appendix is visualized. No colonic dilatation or wall thickening. Slight redundancy of the sigmoid. No mesenteric hematoma or mesenteric contusion. Vascular/Lymphatic: Atherosclerotic plaque within the normal caliber aorta. No traumatic vascular injury in the abdomen or pelvis. No evidence of active contrast extravasation. Reproductive: Uterus is surgically absent. No concerning adnexal lesions. Other: Postsurgical changes of the right upper quadrant abdominal wall. Small fat containing umbilical hernia and bilateral inguinal hernias. No bowel containing hernias. Musculoskeletal: No acute traumatic osseous injury in the abdomen or pelvis. Remote right L1, L2 and L3 transverse process fractures are noted. Moderate to severe multilevel discogenic and facet degenerative changes throughout the lumbar spine with straightening of the normal lumbar lordosis and interspinous arthrosis compatible with Baastrup's disease dedicated spine reconstructions were performed. IMPRESSION: 1. No evidence of acute traumatic injury within the chest, abdomen, or pelvis. 2. Mild circumferential thickening of the bladder wall with faint perivesicular haze, suggestive of cystitis. Correlate with urinalysis. 3. Patient appears to be post hysterectomy and cholecystectomy, with surgical material in the right upper quadrant abdominal wall correlate with surgical history. 4. Coronary atherosclerosis. 5. Thoracolumbar spinal reconstruction imaging was performed, please see dedicated report 6. Aortic Atherosclerosis (ICD10-I70.0). Electronically Signed   By: Kreg Shropshire M.D.   On: 10/20/2019 22:09   Ct Cervical Spine Wo Contrast  Result Date: 10/20/2019 CLINICAL DATA:  73 year old female with head trauma. EXAM: CT HEAD WITHOUT CONTRAST CT CERVICAL SPINE WITHOUT CONTRAST TECHNIQUE: Multidetector CT imaging of the  head and cervical spine was performed following the standard protocol without intravenous contrast. Multiplanar CT image reconstructions of the cervical spine were also generated. COMPARISON:  Head CT dated 05/10/2019 FINDINGS: CT HEAD FINDINGS Brain: Mild age-related atrophy and chronic microvascular ischemic changes. There is no acute intracranial hemorrhage. No mass effect or midline shift. No extra-axial fluid collection. Vascular: No hyperdense vessel or unexpected calcification. Skull: Normal. Negative for fracture or focal lesion. Sinuses/Orbits: No acute finding. Other: None CT CERVICAL SPINE FINDINGS Alignment: No acute subluxation Skull base and vertebrae: No acute fracture. Osteopenia. Soft tissues and spinal canal: No prevertebral fluid or swelling. No visible canal hematoma. Disc levels:  No acute findings. Mild degenerative changes. Upper chest: Biapical interstitial streaky densities may represent atelectasis or edema. Other: Bilateral carotid bulb calcified plaques. IMPRESSION: 1. No acute intracranial hemorrhage. Mild age-related atrophy and chronic microvascular ischemic changes. 2. No acute/traumatic  cervical spine pathology. Electronically Signed   By: Elgie Collard M.D.   On: 10/20/2019 21:58   Ct Abdomen Pelvis W Contrast  Result Date: 10/20/2019 CLINICAL DATA:  Abdominal trauma, blunt, found down, headache and abdominal pain EXAM: CT CHEST, ABDOMEN, AND PELVIS WITH CONTRAST TECHNIQUE: Multidetector CT imaging of the chest, abdomen and pelvis was performed following the standard protocol during bolus administration of intravenous contrast. CONTRAST:  OMNIPAQUE IOHEXOL 300 MG/ML  SOLN COMPARISON:  CTA chest May 19, 2017, CT abdomen pelvis June 22, 2017 FINDINGS: CT CHEST FINDINGS Cardiovascular: The aorta is normal caliber. No acute luminal abnormality of the aorta is seen. No periaortic stranding or hemorrhage. Atherosclerotic calcifications are present throughout the normal  caliber aorta. Normal 3 vessel branching of the aortic arch. Minimal plaque within the proximal great vessels. Normal heart size. Trace pericardial fluid is likely within physiologic normal. Atherosclerotic calcification of the coronary arteries. Central pulmonary arteries are normal caliber. No large central filling defects on this non tailored examination. Mediastinum/Nodes: No mediastinal hematoma or pneumomediastinum. No acute traumatic abnormality of the trachea or esophagus. Thyroid gland and thoracic inlet are unremarkable. No mediastinal, hilar or axillary adenopathy. Lungs/Pleura: Bandlike areas of scarring and/or atelectasis are present in the lung bases and apices. Dependent atelectasis posteriorly. No consolidation, features of edema, pneumothorax, or effusion. No suspicious pulmonary nodules or masses. Musculoskeletal: No acute osseous injury or suspicious osseous lesion within the chest. Multilevel degenerative changes are present in the thoracic spine. Moderate degenerative changes noted in both shoulders features are not significantly changed from comparison studies. No abnormal chest wall hematoma or traumatic soft tissue injury. CT ABDOMEN PELVIS FINDINGS Hepatobiliary: No hepatic injury or perihepatic hematoma. No focal liver abnormality is seen. Patient is post cholecystectomy. Slight prominence of the biliary tree likely related to reservoir effect. No calcified intraductal gallstones. Pancreas: Partial fatty replacement of the pancreas particularly at the level of the pancreatic head. No pancreatic ductal dilatation or convincing peripancreatic inflammation Spleen: No splenic injury or perisplenic hematoma. No suspicious splenic lesions. Adrenals/Urinary Tract: No adrenal hemorrhage or suspicious adrenal lesions. No direct renal injury or perirenal hemorrhage. No extravasation of contrast is seen on excretory phase delayed imaging. Kidneys are otherwise unremarkable, without renal calculi,  suspicious lesion, or hydronephrosis. No evidence of bladder injury. Mild circumferential thickening of the bladder wall with faint perivesicular haze. Stomach/Bowel: Distal esophagus stomach and duodenal sweep are unremarkable. No small bowel dilatation or wall thickening. A normal appendix is visualized. No colonic dilatation or wall thickening. Slight redundancy of the sigmoid. No mesenteric hematoma or mesenteric contusion. Vascular/Lymphatic: Atherosclerotic plaque within the normal caliber aorta. No traumatic vascular injury in the abdomen or pelvis. No evidence of active contrast extravasation. Reproductive: Uterus is surgically absent. No concerning adnexal lesions. Other: Postsurgical changes of the right upper quadrant abdominal wall. Small fat containing umbilical hernia and bilateral inguinal hernias. No bowel containing hernias. Musculoskeletal: No acute traumatic osseous injury in the abdomen or pelvis. Remote right L1, L2 and L3 transverse process fractures are noted. Moderate to severe multilevel discogenic and facet degenerative changes throughout the lumbar spine with straightening of the normal lumbar lordosis and interspinous arthrosis compatible with Baastrup's disease dedicated spine reconstructions were performed. IMPRESSION: 1. No evidence of acute traumatic injury within the chest, abdomen, or pelvis. 2. Mild circumferential thickening of the bladder wall with faint perivesicular haze, suggestive of cystitis. Correlate with urinalysis. 3. Patient appears to be post hysterectomy and cholecystectomy, with surgical material in the right upper  quadrant abdominal wall correlate with surgical history. 4. Coronary atherosclerosis. 5. Thoracolumbar spinal reconstruction imaging was performed, please see dedicated report 6. Aortic Atherosclerosis (ICD10-I70.0). Electronically Signed   By: Kreg Shropshire M.D.   On: 10/20/2019 22:09   Ct T-spine No Charge  Result Date: 10/20/2019 CLINICAL DATA:   Found down EXAM: CT THORACIC AND LUMBAR SPINE WITHOUT CONTRAST TECHNIQUE: Multiplanar CT images of the thoracic and lumbar spine were reconstructed from contemporary CT of the Chest, Abdomen and Pelvis. COMPARISON:  Lumbar MRI August 11, 2018, CT chest May 20, 2017, CT abdomen pelvis June 22, 2017 FINDINGS: CT THORACIC SPINE FINDINGS Alignment: Preservation of the normal thoracic kyphosis. Mild dextrocurvature. No traumatic listhesis. Posterior elements remain normally aligned. Vertebrae: Diffuse demineralization of the bones which may limit detection of subtle, nondisplaced fractures. Corticated triangular fragment at the anterior superior corner of T9 may reflect disc calcification versus a limbus vertebrae. No acute fracture or vertebral body height loss. No suspicious osseous lesions. Paraspinal and other soft tissues: No paravertebral fluid or swelling. No visible canal hematoma. For findings within the chest, please see dedicated CT report. Disc levels: Multilevel discogenic and facet degenerative changes are present throughout the thoracic spine. Features are most pronounced at T10-T11 where a slightly left central posterior disc osteophyte complex effaces the ventral thecal sac and results in mild-to-moderate canal stenosis. No significant neural foraminal narrowing is seen in the thoracic spine. CT LUMBAR SPINE FINDINGS Segmentation: 5 lumbar type vertebrae. Alignment: Straightening of the normal lumbar lordosis with slight degenerative reversal seen at L2-3. Stable grade 1 anterolisthesis L2-L3. No acute traumatic listhesis. Mild dextrocurvature of the lumbar spine, apex at L3-L4. Vertebrae: The bones appear diffusely demineralized. Such finding may limit detection of subtle, nondisplaced fractures. There are multilevel Schmorl's node formations and sclerotic endplate changes on a discogenic basis. Hypertrophic facet changes are noted as well most pronounced at L4-5. Stable postsurgical changes from prior  L5-S1 laminectomy. Additional degenerative changes and sclerosis are noted in both SI joints. No disruption of the sacral trabecula. Remote posttraumatic deformity of the right L1 through L3 transverse processes. Paraspinal and other soft tissues: No visible canal hematoma. No acute paraspinal soft tissue swelling or fluid. Disc levels: Level by level evaluation of the lumbar spine below: T12-L1: Disc height loss and vacuum phenomenon. Small calcified global disc bulge with vacuum phenomena. No significant spinal canal or foraminal stenosis. L1-L2: Disc height loss, small global disc bulge with vacuum phenomenon. Facet arthropathy with redundancy of the ligamentum flavum. Mild canal stenosis. No significant foraminal narrowing. L2-L3: Grade 1 anterolisthesis, disc height loss, small global disc bulge and vacuum phenomenon with posterior facet hypertrophy. Moderate canal stenosis, mild bilateral foraminal narrowing L3-L4: Severe disc height loss, global disc bulge with vacuum phenomenon and sclerotic endplate changes. Mild canal stenosis. Moderate bilateral foraminal narrowing. L4-L5: Disc height loss and vacuum phenomenon with a global disc bulge with central extrusion better seen on comparison MR. Moderate facet hypertrophic changes. Mild canal stenosis with mild bilateral foraminal narrowing. L5-S1: Left laminectomy at L5 and S1. Near complete disc height loss with vacuum phenomenon and calcified posterior spurring and hypertrophic facet changes. Findings result in mild canal stenosis and moderate bilateral foraminal narrowing. IMPRESSION: *Diffuse bony demineralization may limit detection of subtle, nondisplaced fractures. *For acute findings in the chest, abdomen and pelvis, please see dedicated CT report. CT THORACIC SPINE IMPRESSION 1. No acute fracture or traumatic listhesis. 2. 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 3. Limbus vertebrae T9 CT LUMBAR SPINE IMPRESSION 1. No acute fracture or traumatic listhesis. 2. Remote fracture deformity of the right L1-L3 transverse processes. 3. Stable grade 1 anterolisthesis L2 on L3 with focal reversal of lumbar lordosis. 4. Multilevel discogenic and facet degenerative changes through the lumbar spine as detailed level by level above. Findings maximal at L2-3 with moderate canal stenosis. 5. Prior hemilaminectomies at L5-S1. Electronically Signed   By: Kreg Shropshire M.D.   On: 10/20/2019 22:28   Ct L-spine No Charge  Result Date: 10/20/2019 CLINICAL DATA:  Found down EXAM: CT THORACIC AND LUMBAR SPINE WITHOUT CONTRAST TECHNIQUE: Multiplanar CT images of the thoracic and lumbar spine were reconstructed from contemporary CT of the Chest, Abdomen and Pelvis. COMPARISON:  Lumbar MRI August 11, 2018, CT chest May 20, 2017, CT abdomen pelvis June 22, 2017 FINDINGS: CT THORACIC SPINE FINDINGS Alignment: Preservation of the normal thoracic kyphosis. Mild dextrocurvature. No traumatic listhesis. Posterior elements remain normally aligned. Vertebrae: Diffuse demineralization of the bones which may limit detection of subtle, nondisplaced fractures. Corticated triangular fragment at the anterior superior corner of T9 may reflect disc calcification versus a limbus vertebrae. No acute fracture or vertebral body height loss. No suspicious osseous lesions. Paraspinal and other soft tissues: No paravertebral fluid or swelling. No visible canal hematoma. For findings within the chest, please see dedicated CT report. Disc levels: Multilevel discogenic and facet degenerative changes are present throughout the thoracic spine. Features are most pronounced at T10-T11 where a slightly left central posterior disc osteophyte complex effaces the ventral thecal sac and results in mild-to-moderate canal stenosis. No significant neural foraminal narrowing is seen in the thoracic spine. CT LUMBAR SPINE  FINDINGS Segmentation: 5 lumbar type vertebrae. Alignment: Straightening of the normal lumbar lordosis with slight degenerative reversal seen at L2-3. Stable grade 1 anterolisthesis L2-L3. No acute traumatic listhesis. Mild dextrocurvature of the lumbar spine, apex at L3-L4. Vertebrae: The bones appear diffusely demineralized. Such finding may limit detection of subtle, nondisplaced fractures. There are multilevel Schmorl's node formations and sclerotic endplate changes on a discogenic basis. Hypertrophic facet changes are noted as well most pronounced at L4-5. Stable postsurgical changes from prior L5-S1 laminectomy. Additional degenerative changes and sclerosis are noted in both SI joints. No disruption of the sacral trabecula. Remote posttraumatic deformity of the right L1 through L3 transverse processes. Paraspinal and other soft tissues: No visible canal hematoma. No acute paraspinal soft tissue swelling or fluid. Disc levels: Level by level evaluation of the lumbar spine below: T12-L1: Disc height loss and vacuum phenomenon. Small calcified global disc bulge with vacuum phenomena. No significant spinal canal or foraminal stenosis. L1-L2: Disc height loss, small global disc bulge with vacuum phenomenon. Facet arthropathy with redundancy of the ligamentum flavum. Mild canal stenosis. No significant foraminal narrowing. L2-L3: Grade 1 anterolisthesis, disc height loss, small global disc bulge and vacuum phenomenon with posterior facet hypertrophy. Moderate canal stenosis, mild bilateral foraminal narrowing L3-L4: Severe disc height loss, global disc bulge with vacuum phenomenon and sclerotic endplate changes. Mild canal stenosis. Moderate bilateral foraminal narrowing. L4-L5: Disc height loss and vacuum phenomenon with a global disc bulge with central extrusion better seen on comparison MR. Moderate facet hypertrophic changes. Mild canal stenosis with mild bilateral foraminal narrowing. L5-S1: Left laminectomy at  L5 and S1. Near complete disc height loss with vacuum phenomenon and calcified posterior spurring and hypertrophic facet changes. Findings result in mild canal stenosis and moderate bilateral foraminal narrowing. IMPRESSION: *Diffuse  bony demineralization may limit detection of subtle, nondisplaced fractures. *For acute findings in the chest, abdomen and pelvis, please see dedicated CT report. CT THORACIC SPINE IMPRESSION 1. No acute fracture or traumatic listhesis. 2. 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 3. Limbus vertebrae T9 CT LUMBAR SPINE IMPRESSION 1. No acute fracture or traumatic listhesis. 2. Remote fracture deformity of the right L1-L3 transverse processes. 3. Stable grade 1 anterolisthesis L2 on L3 with focal reversal of lumbar lordosis. 4. Multilevel discogenic and facet degenerative changes through the lumbar spine as detailed level by level above. Findings maximal at L2-3 with moderate canal stenosis. 5. Prior hemilaminectomies at L5-S1. Electronically Signed   By: Kreg Shropshire M.D.   On: 10/20/2019 22:28    ____________________________________________   PROCEDURES  Procedure(s) performed (including Critical Care):  Procedures   ____________________________________________   INITIAL IMPRESSION / ASSESSMENT AND PLAN / ED COURSE  Jessica Donovan was evaluated in Emergency Department on 10/20/2019 for the symptoms described in the history of present illness. She was evaluated in the context of the global COVID-19 pandemic, which necessitated consideration that the patient might be at risk for infection with the SARS-CoV-2 virus that causes COVID-19. Institutional protocols and algorithms that pertain to the evaluation of patients at risk for COVID-19 are in a state of rapid change based on information released by regulatory bodies including the CDC and federal and state organizations.  These policies and algorithms were followed during the patient's care in the ED.    Patient is a 73 year old who presents with altered mental status.  I am concerned that this is secondary to substance abuse given prior presentations.  We will give her some Narcan and reevaluate.  Patient also found on the ground and altered therefore I am unable to get a reliable physical exam therefore will get CT scan to evaluate for intracranial hemorrhage, CT chest to evaluate for rib fractures, CT abdomen to evaluate for abdominal injuries.  Will also get urine to evaluate for UTI and drug screen.   8:45 PM patient more awake after the Narcan.  Patient was positive for opioids, benzos and tricyclic's.  Patient's kidney function is elevated at 1.14.  CT scans are negative except for concern for cystitis.  Patient's urine is consistent with UTI and patient does have cystitis on CT scan.  I discussed with patient and she has been having some urinary symptoms for about 1 week.  Given patient patient is still a little bit sleepy and will need medication adjustments as well as UTI we will give a dose of ceftriaxone, send culture and discussed the hospital team for admission.  Patient patient denies any SI.   ____________________________________________   FINAL CLINICAL IMPRESSION(S) / ED DIAGNOSES   Final diagnoses:  Fall  Acute cystitis without hematuria  Accidental drug overdose, initial encounter      MEDICATIONS GIVEN DURING THIS VISIT:  Medications  cefTRIAXone (ROCEPHIN) 1 g in sodium chloride 0.9 % 100 mL IVPB (has no administration in time range)  ondansetron (ZOFRAN) injection 4 mg (4 mg Intravenous Given 10/20/19 2023)  naloxone [facility]) injection 0.4 mg (0.4 mg Intravenous Given 10/20/19 2024)  iohexol (OMNIPAQUE) 300 MG/ML solution 100 mL (100 mLs Intravenous Contrast Given 10/20/19 2119)  sodium chloride 0.9 % bolus 1,000 mL (0 mLs Intravenous Stopped 10/20/19 2338)     ED  Discharge Orders    None       Note:  This  document was prepared using Systems analyst and may include unintentional dictation errors.   Vanessa Philadelphia, MD 10/20/19 [phone]

## 2019-10-20 NOTE — ED Triage Notes (Signed)
Pt from home via [facility]. Per EMS, pt AMS; face down on the floor found by neighbor; unknown down time. Pt co headache and abd pain. EDP Funk at bedside upon arrival. Pt unable answer questions disorientedx4. [facility] CBG [id number].

## 2019-10-21 DIAGNOSIS — N3000 Acute cystitis without hematuria: Secondary | ICD-10-CM | POA: Diagnosis present

## 2019-10-21 DIAGNOSIS — Z9861 Coronary angioplasty status: Secondary | ICD-10-CM | POA: Diagnosis not present

## 2019-10-21 DIAGNOSIS — W19XXXA Unspecified fall, initial encounter: Secondary | ICD-10-CM | POA: Diagnosis not present

## 2019-10-21 DIAGNOSIS — Z20828 Contact with and (suspected) exposure to other viral communicable diseases: Secondary | ICD-10-CM | POA: Diagnosis present

## 2019-10-21 DIAGNOSIS — R5381 Other malaise: Secondary | ICD-10-CM | POA: Diagnosis present

## 2019-10-21 DIAGNOSIS — N390 Urinary tract infection, site not specified: Secondary | ICD-10-CM | POA: Diagnosis present

## 2019-10-21 DIAGNOSIS — L8990 Pressure ulcer of unspecified site, unspecified stage: Secondary | ICD-10-CM | POA: Diagnosis present

## 2019-10-21 DIAGNOSIS — F419 Anxiety disorder, unspecified: Secondary | ICD-10-CM | POA: Diagnosis present

## 2019-10-21 DIAGNOSIS — I25118 Atherosclerotic heart disease of native coronary artery with other forms of angina pectoris: Secondary | ICD-10-CM | POA: Diagnosis not present

## 2019-10-21 DIAGNOSIS — Z885 Allergy status to narcotic agent status: Secondary | ICD-10-CM | POA: Diagnosis not present

## 2019-10-21 DIAGNOSIS — E1140 Type 2 diabetes mellitus with diabetic neuropathy, unspecified: Secondary | ICD-10-CM | POA: Diagnosis present

## 2019-10-21 DIAGNOSIS — Z66 Do not resuscitate: Secondary | ICD-10-CM | POA: Diagnosis present

## 2019-10-21 DIAGNOSIS — I10 Essential (primary) hypertension: Secondary | ICD-10-CM | POA: Diagnosis present

## 2019-10-21 DIAGNOSIS — Z9109 Other allergy status, other than to drugs and biological substances: Secondary | ICD-10-CM | POA: Diagnosis not present

## 2019-10-21 DIAGNOSIS — I252 Old myocardial infarction: Secondary | ICD-10-CM | POA: Diagnosis not present

## 2019-10-21 DIAGNOSIS — Z8673 Personal history of transient ischemic attack (TIA), and cerebral infarction without residual deficits: Secondary | ICD-10-CM | POA: Diagnosis not present

## 2019-10-21 DIAGNOSIS — G9340 Encephalopathy, unspecified: Secondary | ICD-10-CM | POA: Diagnosis not present

## 2019-10-21 DIAGNOSIS — Z881 Allergy status to other antibiotic agents status: Secondary | ICD-10-CM | POA: Diagnosis not present

## 2019-10-21 DIAGNOSIS — T50901A Poisoning by unspecified drugs, medicaments and biological substances, accidental (unintentional), initial encounter: Secondary | ICD-10-CM | POA: Diagnosis present

## 2019-10-21 DIAGNOSIS — G9341 Metabolic encephalopathy: Secondary | ICD-10-CM | POA: Diagnosis present

## 2019-10-21 DIAGNOSIS — M549 Dorsalgia, unspecified: Secondary | ICD-10-CM | POA: Diagnosis present

## 2019-10-21 DIAGNOSIS — E1169 Type 2 diabetes mellitus with other specified complication: Secondary | ICD-10-CM | POA: Diagnosis present

## 2019-10-21 DIAGNOSIS — Z880 Allergy status to penicillin: Secondary | ICD-10-CM | POA: Diagnosis not present

## 2019-10-21 DIAGNOSIS — B9620 Unspecified Escherichia coli [E. coli] as the cause of diseases classified elsewhere: Secondary | ICD-10-CM | POA: Diagnosis present

## 2019-10-21 DIAGNOSIS — G8929 Other chronic pain: Secondary | ICD-10-CM | POA: Diagnosis present

## 2019-10-21 DIAGNOSIS — Z89422 Acquired absence of other left toe(s): Secondary | ICD-10-CM | POA: Diagnosis not present

## 2019-10-21 DIAGNOSIS — Z8249 Family history of ischemic heart disease and other diseases of the circulatory system: Secondary | ICD-10-CM | POA: Diagnosis not present

## 2019-10-21 DIAGNOSIS — I2510 Atherosclerotic heart disease of native coronary artery without angina pectoris: Secondary | ICD-10-CM | POA: Diagnosis present

## 2019-10-21 LAB — BASIC METABOLIC PANEL
Anion gap: 8 (ref 5–15)
BUN: 22 mg/dL (ref 8–23)
CO2: 28 mmol/L (ref 22–32)
Calcium: 9.6 mg/dL (ref 8.9–10.3)
Chloride: 105 mmol/L (ref 98–111)
Creatinine, Ser: 0.85 mg/dL (ref 0.44–1.00)
GFR calc Af Amer: >60 mL/min (ref >60)
GFR calc non Af Amer: >60 mL/min (ref >60)
Glucose, Bld: 229 mg/dL — ABNORMAL HIGH (ref 70–99)
Potassium: 5.2 mmol/L — ABNORMAL HIGH (ref 3.5–5.1)
Sodium: 141 mmol/L (ref 135–145)

## 2019-10-21 LAB — HEMOGLOBIN A1C
Hgb A1c MFr Bld: 8.8 % — ABNORMAL HIGH (ref 4.8–5.6)
Mean Plasma Glucose: 205.86 mg/dL

## 2019-10-21 LAB — TROPONIN I (HIGH SENSITIVITY): Troponin I (High Sensitivity): 8 ng/L (ref <18)

## 2019-10-21 LAB — GLUCOSE, CAPILLARY
Glucose-Capillary: 247 mg/dL — ABNORMAL HIGH (ref 70–99)
Glucose-Capillary: 264 mg/dL — ABNORMAL HIGH (ref 70–99)
Glucose-Capillary: 252 mg/dL — ABNORMAL HIGH (ref 70–99)
Glucose-Capillary: 231 mg/dL — ABNORMAL HIGH (ref 70–99)

## 2019-10-21 LAB — CBC
HCT: 39.6 % (ref 36.0–46.0)
Hemoglobin: 12.5 g/dL (ref 12.0–15.0)
MCH: 31.6 pg (ref 26.0–34.0)
MCHC: 31.6 g/dL (ref 30.0–36.0)
MCV: 100 fL (ref 80.0–100.0)
Platelets: 234 10*3/uL (ref 150–400)
RBC: 3.96 MIL/uL (ref 3.87–5.11)
RDW: 12.6 % (ref 11.5–15.5)
WBC: 13.3 10*3/uL — ABNORMAL HIGH (ref 4.0–10.5)
nRBC: 0 % (ref 0.0–0.2)

## 2019-10-21 LAB — SARS CORONAVIRUS 2 (TAT 6-24 HRS): SARS Coronavirus 2: NEGATIVE

## 2019-10-21 LAB — LACTIC ACID, PLASMA: Lactic Acid, Venous: 1.4 mmol/L (ref 0.5–1.9)

## 2019-10-21 MED ORDER — ENOXAPARIN SODIUM 40 MG/0.4ML ~~LOC~~ SOLN
40.0000 mg | SUBCUTANEOUS | Status: DC
  Administered 2019-10-21 – 2019-10-23 (×3): 40 mg via SUBCUTANEOUS
  Filled 2019-10-21 (×4): qty 0.4

## 2019-10-21 MED ORDER — PANTOPRAZOLE SODIUM 40 MG PO TBEC
40.0000 mg | DELAYED_RELEASE_TABLET | Freq: Every day | ORAL | Status: DC
  Administered 2019-10-21 – 2019-10-24 (×4): 40 mg via ORAL
  Filled 2019-10-21 (×4): qty 1

## 2019-10-21 MED ORDER — ISOSORBIDE MONONITRATE ER 30 MG PO TB24
30.0000 mg | ORAL_TABLET | Freq: Every day | ORAL | Status: DC
  Administered 2019-10-21 – 2019-10-24 (×4): 30 mg via ORAL
  Filled 2019-10-21 (×4): qty 1

## 2019-10-21 MED ORDER — ONDANSETRON HCL 4 MG/2ML IJ SOLN
4.0000 mg | Freq: Four times a day (QID) | INTRAMUSCULAR | Status: DC | PRN
  Administered 2019-10-22: 4 mg via INTRAVENOUS
  Filled 2019-10-21: qty 2

## 2019-10-21 MED ORDER — CYCLOBENZAPRINE HCL 10 MG PO TABS
10.0000 mg | ORAL_TABLET | Freq: Three times a day (TID) | ORAL | Status: DC | PRN
  Administered 2019-10-22 – 2019-10-24 (×5): 10 mg via ORAL
  Filled 2019-10-21 (×5): qty 1

## 2019-10-21 MED ORDER — TRAZODONE HCL 50 MG PO TABS
100.0000 mg | ORAL_TABLET | Freq: Every day | ORAL | Status: DC
  Administered 2019-10-21 – 2019-10-23 (×3): 100 mg via ORAL
  Filled 2019-10-21 (×2): qty 2
  Filled 2019-10-21: qty 1

## 2019-10-21 MED ORDER — ALPRAZOLAM 0.5 MG PO TABS
0.5000 mg | ORAL_TABLET | Freq: Three times a day (TID) | ORAL | Status: DC
  Administered 2019-10-21 – 2019-10-24 (×10): 0.5 mg via ORAL
  Filled 2019-10-21 (×10): qty 1

## 2019-10-21 MED ORDER — DIPHENOXYLATE-ATROPINE 2.5-0.025 MG PO TABS: 1.0000 | ORAL_TABLET | ORAL | Status: DC | PRN

## 2019-10-21 MED ORDER — ACETAMINOPHEN 325 MG PO TABS
650.0000 mg | ORAL_TABLET | Freq: Four times a day (QID) | ORAL | Status: DC | PRN
  Administered 2019-10-21 – 2019-10-23 (×2): 650 mg via ORAL
  Filled 2019-10-21 (×2): qty 2

## 2019-10-21 MED ORDER — SODIUM CHLORIDE 0.45 % IV SOLN
INTRAVENOUS | Status: DC
  Administered 2019-10-21: 08:00:00 via INTRAVENOUS

## 2019-10-21 MED ORDER — MORPHINE SULFATE 15 MG PO TABS
15.0000 mg | ORAL_TABLET | Freq: Three times a day (TID) | ORAL | Status: DC
  Administered 2019-10-21 – 2019-10-24 (×10): 15 mg via ORAL
  Filled 2019-10-21 (×9): qty 1

## 2019-10-21 MED ORDER — SODIUM CHLORIDE 0.9 % IV SOLN
1.0000 g | INTRAVENOUS | Status: DC
  Administered 2019-10-21 – 2019-10-22 (×2): 1 g via INTRAVENOUS
  Filled 2019-10-21: qty 1
  Filled 2019-10-21 (×2): qty 10
  Filled 2019-10-21: qty 1

## 2019-10-21 MED ORDER — ONDANSETRON HCL 4 MG PO TABS: 4.0000 mg | ORAL_TABLET | Freq: Four times a day (QID) | ORAL | Status: DC | PRN

## 2019-10-21 MED ORDER — INSULIN ASPART 100 UNIT/ML ~~LOC~~ SOLN
0.0000 [IU] | Freq: Every day | SUBCUTANEOUS | Status: DC
  Administered 2019-10-21: 3 [IU] via SUBCUTANEOUS
  Administered 2019-10-23: 21:00:00 2 [IU] via SUBCUTANEOUS
  Filled 2019-10-21 (×2): qty 1

## 2019-10-21 MED ORDER — ASPIRIN EC 81 MG PO TBEC
81.0000 mg | DELAYED_RELEASE_TABLET | Freq: Every day | ORAL | Status: DC
  Administered 2019-10-21 – 2019-10-24 (×4): 81 mg via ORAL
  Filled 2019-10-21 (×4): qty 1

## 2019-10-21 MED ORDER — INSULIN GLARGINE 100 UNIT/ML ~~LOC~~ SOLN
50.0000 [IU] | Freq: Every day | SUBCUTANEOUS | Status: DC
  Administered 2019-10-21 – 2019-10-24 (×4): 50 [IU] via SUBCUTANEOUS
  Filled 2019-10-21 (×5): qty 0.5

## 2019-10-21 MED ORDER — LISINOPRIL 20 MG PO TABS
20.0000 mg | ORAL_TABLET | Freq: Two times a day (BID) | ORAL | Status: DC
  Administered 2019-10-21 – 2019-10-24 (×7): 20 mg via ORAL
  Filled 2019-10-21 (×3): qty 2
  Filled 2019-10-21 (×4): qty 1

## 2019-10-21 MED ORDER — ATORVASTATIN CALCIUM 20 MG PO TABS
20.0000 mg | ORAL_TABLET | Freq: Every day | ORAL | Status: DC
  Administered 2019-10-21 – 2019-10-24 (×4): 20 mg via ORAL
  Filled 2019-10-21 (×6): qty 1

## 2019-10-21 MED ORDER — MIRTAZAPINE 15 MG PO TABS
15.0000 mg | ORAL_TABLET | Freq: Every day | ORAL | Status: DC
  Administered 2019-10-21 – 2019-10-23 (×3): 15 mg via ORAL
  Filled 2019-10-21 (×3): qty 1

## 2019-10-21 MED ORDER — INSULIN ASPART 100 UNIT/ML ~~LOC~~ SOLN
0.0000 [IU] | Freq: Three times a day (TID) | SUBCUTANEOUS | Status: DC
  Administered 2019-10-21: 5 [IU] via SUBCUTANEOUS
  Administered 2019-10-21: 3 [IU] via SUBCUTANEOUS
  Administered 2019-10-21: 19:00:00 5 [IU] via SUBCUTANEOUS
  Administered 2019-10-22: 13:00:00 2 [IU] via SUBCUTANEOUS
  Administered 2019-10-22: 3 [IU] via SUBCUTANEOUS
  Administered 2019-10-23 (×2): 2 [IU] via SUBCUTANEOUS
  Administered 2019-10-24: 12:00:00 3 [IU] via SUBCUTANEOUS
  Administered 2019-10-24: 08:00:00 1 [IU] via SUBCUTANEOUS
  Filled 2019-10-21 (×9): qty 1

## 2019-10-21 MED ORDER — ACETAMINOPHEN 650 MG RE SUPP
650.0000 mg | Freq: Four times a day (QID) | RECTAL | Status: DC | PRN
  Filled 2019-10-21: qty 1

## 2019-10-21 NOTE — ED Notes (Signed)
Pt states she cannot find her red glasses, checked room and could npt find them- called Chrissy, RN to see of they were left in room 2, states she has not seen them- spoke with Theadora Rama, charge RN and was notified that pt may not have brought them from home d/t triage note- will give pt a phone to call [facility] to have them look for her glasses

## 2019-10-21 NOTE — ED Notes (Signed)
Pt given meal tray.

## 2019-10-21 NOTE — ED Notes (Signed)
Provider Jannifer Franklin at bedside.

## 2019-10-21 NOTE — ED Notes (Signed)
Pt given phone to speak with Vicente Males

## 2019-10-21 NOTE — ED Notes (Signed)
This RN to bedside. Pt used call bell to request, again, to urinate. Reminded she has a purewick in place. 200cc already noted in canister. Bed remains locked low. Rail remains up. Call bell remains within reach.

## 2019-10-21 NOTE — ED Notes (Signed)
Called dietary for substitute breakfast- stated they will send something else

## 2019-10-21 NOTE — ED Notes (Signed)
Pt given dinner tray- pt sat up to eat

## 2019-10-21 NOTE — ED Notes (Signed)
Pt more alert and requesting water to drink- pt sat up in bed and given water and glycerine swabs

## 2019-10-21 NOTE — ED Notes (Addendum)
CBC and Cr serum only to be drawn if not already complete per order comments. Not collected again as already resulted from earlier today. Pt requested to use restroom to urinate - reminded that purewick is in place and prompted to go ahead and urinate.

## 2019-10-21 NOTE — H&P (Signed)
[facility] - [place] at [facility]   PATIENT NAME: Jessica Donovan    MR#:  [id number]  DATE OF BIRTH:  01/25/1946  DATE OF ADMISSION:  10/20/2019  PRIMARY CARE PHYSICIAN: Garlon Hatchet, MD   REQUESTING/REFERRING PHYSICIAN: Fuller Plan, MD  CHIEF COMPLAINT:   Chief Complaint  Patient presents with  . Altered Mental Status  . Fall    HISTORY OF PRESENT ILLNESS:  Jessica Donovan  is a 73 y.o. female who presents with chief complaint as above.  Patient presents to the ED after a fall with a complaint of some confusion as well.  She also states that she has been having some dysuria recently.  Here she is found to have UTI.  Hospitalist were called for admission  PAST MEDICAL HISTORY:   Past Medical History:  Diagnosis Date  . Anxiety   . Chronic back pain   . Collagen vascular disease (HCC)   . Coronary artery disease    a. s/p PCI/DES to dRCA & mLCx in 2014 with repeat LHC in 10/2015 showing patent stents  . Diabetes mellitus without complication (HCC)   . Hypertension   . Low back pain   . Myocardial infarction (HCC)   . Osteomyelitis of toe (HCC) 01/23/2016  . Stroke [facility])      PAST SURGICAL HISTORY:   Past Surgical History:  Procedure Laterality Date  . AMPUTATION TOE Left 11/10/2015   Procedure: AMPUTATION TOE;  Surgeon: Linus Galas, MD;  Location: [facility] ORS;  Service: Podiatry;  Laterality: Left;  . AMPUTATION TOE Left 01/24/2016   Procedure: AMPUTATION TOE (2nd mpj);  Surgeon: Linus Galas, DPM;  Location: [facility] ORS;  Service: Podiatry;  Laterality: Left;  . AMPUTATION TOE Right 12/21/2018   Procedure: 2ND TOE AMPUTATION WITH DEBRIDEMENT OF SOFT TISSUE;  Surgeon: Recardo Evangelist, DPM;  Location: [facility] ORS;  Service: Podiatry;  Laterality: Right;  . CARDIAC CATHETERIZATION N/A 11/12/2015   Procedure: Left Heart Cath and Coronary Angiography;  Surgeon: Marcina Millard, MD;  Location: [facility] INVASIVE CV LAB;  Service: Cardiovascular;  Laterality: N/A;   . COLONOSCOPY WITH PROPOFOL N/A 07/20/2017   Procedure: COLONOSCOPY WITH PROPOFOL;  Surgeon: Wyline Mood, MD;  Location: [facility] ENDOSCOPY;  Service: Endoscopy;  Laterality: N/A;  . JOINT REPLACEMENT    . TOE AMPUTATION       SOCIAL HISTORY:   Social History   Tobacco Use  . Smoking status: Never Smoker  . Smokeless tobacco: Never Used  Substance Use Topics  . Alcohol use: No     FAMILY HISTORY:   Family History  Problem Relation Age of Onset  . CAD Mother   . CAD Father      DRUG ALLERGIES:   Allergies  Allergen Reactions  . Ampicillin Nausea And Vomiting  . Bactrim [Sulfamethoxazole-Trimethoprim]   . Codeine Nausea And Vomiting  . Flu Virus Vaccine     Other reaction(s): Other (See Comments) Passed out for 12 days  . Influenza Vaccines Other (See Comments)    Reaction:  Caused pt to pass out   . Methadone Hives and Itching  . Oxycodone-Acetaminophen Nausea And Vomiting  . Percocet [Oxycodone-Acetaminophen] Nausea And Vomiting  . Sulfa Antibiotics   . Tetanus Antitoxin   . Tetanus Toxoids Swelling and Other (See Comments)    Reaction:  Swelling at injection site  . Penicillin G Rash    Has patient had a PCN reaction causing immediate rash, facial/tongue/throat swelling, SOB or lightheadedness with hypotension: Yes Has patient had a PCN  reaction causing severe rash involving mucus membranes or skin necrosis: No Has patient had a PCN reaction that required hospitalization: No Has patient had a PCN reaction occurring within the last 10 years: No If all of the above answers are "NO", then may proceed with Cephalosporin use..  . Tetracyclines & Related Rash    MEDICATIONS AT HOME:   Prior to Admission medications   Medication Sig Start Date End Date Taking? Authorizing Provider  ALPRAZolam Prudy Feeler(XANAX) 1 MG tablet Take 1 tablet (1 mg total) by mouth 3 (three) times daily. 12/23/18  Yes Ojie, Jude, MD  atorvastatin (LIPITOR) 20 MG tablet Take 20 mg by mouth daily.     Yes [provider]  cyclobenzaprine (FLEXERIL) 10 MG tablet Take 1 tablet (10 mg total) by mouth 3 (three) times daily as needed for muscle spasms. 01/27/16  Yes Enid BaasKalisetti, Radhika, MD  diphenoxylate-atropine (LOMOTIL) 2.5-0.025 MG tablet Take 1 tablet by mouth every 4 (four) hours as needed for diarrhea or loose stools.    Yes [provider]  isosorbide mononitrate (IMDUR) 30 MG 24 hr tablet Take 30 mg by mouth daily.    Yes [provider]  lisinopril (ZESTRIL) 20 MG tablet Take 20 mg by mouth 2 (two) times daily. 08/08/19  Yes [provider]  metoprolol tartrate (LOPRESSOR) 25 MG tablet Take 25 mg by mouth 2 (two) times daily. 01/28/17  Yes [provider]  mirtazapine (REMERON) 15 MG tablet Take 15 mg by mouth at bedtime.   Yes [provider]  morphine (MSIR) 15 MG tablet Take 1 tablet (15 mg total) by mouth 3 (three) times daily. Patient taking differently: Take 15 mg by mouth every 4 (four) hours as needed (pain).  12/23/18  Yes Ojie, Jude, MD  nitrofurantoin (MACRODANTIN) 50 MG capsule Take 50 mg by mouth daily.    Yes [provider]  omeprazole (PRILOSEC) 20 MG capsule Take 20 mg by mouth daily.   Yes [provider]  traZODone (DESYREL) 100 MG tablet Take 100 mg by mouth at bedtime. 01/28/17  Yes [provider]  venlafaxine XR (EFFEXOR-XR) 75 MG 24 hr capsule Take 75 mg by mouth 3 (three) times daily.   Yes [provider]  Vitamin D, Ergocalciferol, (DRISDOL) 50000 units CAPS capsule Take 50,000 Units by mouth every Sunday.    Yes [provider]  acetaminophen (TYLENOL) 500 MG tablet Take 1,000 mg by mouth every 8 (eight) hours as needed for mild pain or headache.     [provider]  aspirin 81 MG tablet Take 81 mg by mouth daily.    [provider]  baclofen (LIORESAL) 10 MG tablet Take 5 mg by mouth 3 (three) times daily. As directed    [provider]  ferrous  sulfate 325 (65 FE) MG tablet Take 325 mg by mouth daily with breakfast.    [provider]  fesoterodine (TOVIAZ) 8 MG TB24 tablet Take 8 mg by mouth daily.    [provider]  insulin aspart (NOVOLOG) 100 UNIT/ML injection Inject 3-15 Units into the skin 3 (three) times daily with meals as needed for high blood sugar. Pt uses as needed per sliding scale:    Less than 140:  0 units  140-180:  3 units 181-220:  4 units 221- 260:  6 units 261- 320:  8 units 321-360:  10 units 361-400:  12 units Greater than 400:  15 units    [provider]  Insulin Glargine (  TOUJEO SOLOSTAR) 300 UNIT/ML SOPN Inject 15 Units into the skin daily. Patient taking differently: Inject 15 Units into the skin daily. Patinet reports taking 50 units/day of Toujo if glucose > 130 and if glucose > 140 she takes Novolog SSI in addtiion to the Toujeo  - 09/30/18.  Checks glucose twice daily. 09/13/18   Altamese Dilling, MD  pregabalin (LYRICA) 150 MG capsule Take 150 mg by mouth 2 (two) times daily.    [provider]  promethazine (PHENERGAN) 25 MG tablet Take 25 mg by mouth every 6 (six) hours as needed for nausea.    [provider]    REVIEW OF SYSTEMS:  Review of Systems  Constitutional: Positive for malaise/fatigue. Negative for chills, fever and weight loss.  HENT: Negative for ear pain, hearing loss and tinnitus.   Eyes: Negative for blurred vision, double vision, pain and redness.  Respiratory: Negative for cough, hemoptysis and shortness of breath.   Cardiovascular: Negative for chest pain, palpitations, orthopnea and leg swelling.  Gastrointestinal: Negative for abdominal pain, constipation, diarrhea, nausea and vomiting.  Genitourinary: Positive for dysuria. Negative for frequency and hematuria.  Musculoskeletal: Positive for falls. Negative for back pain, joint pain and neck pain.  Skin:       No acne, rash, or lesions  Neurological: Negative for  dizziness, tremors, focal weakness and weakness.  Endo/Heme/Allergies: Negative for polydipsia. Does not bruise/bleed easily.  Psychiatric/Behavioral: Negative for depression. The patient is not nervous/anxious and does not have insomnia.      VITAL SIGNS:   Vitals:   10/20/19 2107 10/20/19 2111 10/20/19 2200 10/20/19 2230  BP:   128/66 122/64  Pulse:    97  Resp:   (!) 21 19  Temp:      TempSrc:      SpO2: 97%   96%  Weight:  88.5 kg    Height:   (1.727 m)     Wt Readings from Last 3 Encounters:  10/20/19 88.5 kg  08/15/19 87.5 kg  05/10/19 77.1 kg    PHYSICAL EXAMINATION:  Physical Exam  Vitals reviewed. Constitutional: She is oriented to person, place, and time. She appears well-developed and well-nourished. No distress.  HENT:  Head: Normocephalic and atraumatic.  Mouth/Throat: Oropharynx is clear and moist.  Eyes: Pupils are equal, round, and reactive to light. Conjunctivae and EOM are normal. No scleral icterus.  Neck: Normal range of motion. Neck supple. No JVD present. No thyromegaly present.  Cardiovascular: Normal rate, regular rhythm and intact distal pulses. Exam reveals no gallop and no friction rub.  No murmur heard. Respiratory: Effort normal and breath sounds normal. No respiratory distress. She has no wheezes. She has no rales.  GI: Soft. Bowel sounds are normal. She exhibits no distension. There is no abdominal tenderness.  Musculoskeletal: Normal range of motion.        General: No edema.     Comments: No arthritis, no gout  Lymphadenopathy:    She has no cervical adenopathy.  Neurological: She is alert and oriented to person, place, and time. No cranial nerve deficit.  No dysarthria, no aphasia  Skin: Skin is warm and dry. No rash noted. No erythema.  Psychiatric: She has a normal mood and affect. Her behavior is normal. Judgment and thought content normal.    LABORATORY PANEL:   CBC Recent Labs  Lab 10/20/19 2018  WBC 15.1*  HGB 12.4   HCT 38.7  PLT 259   ------------------------------------------------------------------------------------------------------------------  Chemistries  Recent Labs  Lab 10/20/19 2018  NA 135  K 4.6  CL 100  CO2 24  GLUCOSE 216*  BUN 33*  CREATININE 1.14*  CALCIUM 9.9  AST 31  ALT 16  ALKPHOS 71  BILITOT 0.6   ------------------------------------------------------------------------------------------------------------------  Cardiac Enzymes No results for input(s): TROPONINI in the last 168 hours. ------------------------------------------------------------------------------------------------------------------  RADIOLOGY:  Ct Head Wo Contrast  Result Date: 10/20/2019 CLINICAL DATA:  73 year old female with head trauma. EXAM: CT HEAD WITHOUT CONTRAST CT CERVICAL SPINE WITHOUT CONTRAST TECHNIQUE: Multidetector CT imaging of the head and cervical spine was performed following the standard protocol without intravenous contrast. Multiplanar CT image reconstructions of the cervical spine were also generated. COMPARISON:  Head CT dated 05/10/2019 FINDINGS: CT HEAD FINDINGS Brain: Mild age-related atrophy and chronic microvascular ischemic changes. There is no acute intracranial hemorrhage. No mass effect or midline shift. No extra-axial fluid collection. Vascular: No hyperdense vessel or unexpected calcification. Skull: Normal. Negative for fracture or focal lesion. Sinuses/Orbits: No acute finding. Other: None CT CERVICAL SPINE FINDINGS Alignment: No acute subluxation Skull base and vertebrae: No acute fracture. Osteopenia. Soft tissues and spinal canal: No prevertebral fluid or swelling. No visible canal hematoma. Disc levels:  No acute findings. Mild degenerative changes. Upper chest: Biapical interstitial streaky densities may represent atelectasis or edema. Other: Bilateral carotid bulb calcified plaques. IMPRESSION: 1. No acute intracranial hemorrhage. Mild age-related atrophy and chronic  microvascular ischemic changes. 2. No acute/traumatic cervical spine pathology. Electronically Signed   By: Elgie Collard M.D.   On: 10/20/2019 21:58   Ct Chest W Contrast  Result Date: 10/20/2019 CLINICAL DATA:  Abdominal trauma, blunt, found down, headache and abdominal pain EXAM: CT CHEST, ABDOMEN, AND PELVIS WITH CONTRAST TECHNIQUE: Multidetector CT imaging of the chest, abdomen and pelvis was performed following the standard protocol during bolus administration of intravenous contrast. CONTRAST:  OMNIPAQUE IOHEXOL 300 MG/ML  SOLN COMPARISON:  CTA chest May 19, 2017, CT abdomen pelvis June 22, 2017 FINDINGS: CT CHEST FINDINGS Cardiovascular: The aorta is normal caliber. No acute luminal abnormality of the aorta is seen. No periaortic stranding or hemorrhage. Atherosclerotic calcifications are present throughout the normal caliber aorta. Normal 3 vessel branching of the aortic arch. Minimal plaque within the proximal great vessels. Normal heart size. Trace pericardial fluid is likely within physiologic normal. Atherosclerotic calcification of the coronary arteries. Central pulmonary arteries are normal caliber. No large central filling defects on this non tailored examination. Mediastinum/Nodes: No mediastinal hematoma or pneumomediastinum. No acute traumatic abnormality of the trachea or esophagus. Thyroid gland and thoracic inlet are unremarkable. No mediastinal, hilar or axillary adenopathy. Lungs/Pleura: Bandlike areas of scarring and/or atelectasis are present in the lung bases and apices. Dependent atelectasis posteriorly. No consolidation, features of edema, pneumothorax, or effusion. No suspicious pulmonary nodules or masses. Musculoskeletal: No acute osseous injury or suspicious osseous lesion within the chest. Multilevel degenerative changes are present in the thoracic spine. Moderate degenerative changes noted in both shoulders features are not significantly changed from comparison  studies. No abnormal chest wall hematoma or traumatic soft tissue injury. CT ABDOMEN PELVIS FINDINGS Hepatobiliary: No hepatic injury or perihepatic hematoma. No focal liver abnormality is seen. Patient is post cholecystectomy. Slight prominence of the biliary tree likely related to reservoir effect. No calcified intraductal gallstones. Pancreas: Partial fatty replacement of the pancreas particularly at the level of the pancreatic head. No pancreatic ductal dilatation or convincing peripancreatic inflammation Spleen: No splenic injury or perisplenic hematoma. No suspicious splenic lesions. Adrenals/Urinary Tract: No adrenal hemorrhage or  suspicious adrenal lesions. No direct renal injury or perirenal hemorrhage. No extravasation of contrast is seen on excretory phase delayed imaging. Kidneys are otherwise unremarkable, without renal calculi, suspicious lesion, or hydronephrosis. No evidence of bladder injury. Mild circumferential thickening of the bladder wall with faint perivesicular haze. Stomach/Bowel: Distal esophagus stomach and duodenal sweep are unremarkable. No small bowel dilatation or wall thickening. A normal appendix is visualized. No colonic dilatation or wall thickening. Slight redundancy of the sigmoid. No mesenteric hematoma or mesenteric contusion. Vascular/Lymphatic: Atherosclerotic plaque within the normal caliber aorta. No traumatic vascular injury in the abdomen or pelvis. No evidence of active contrast extravasation. Reproductive: Uterus is surgically absent. No concerning adnexal lesions. Other: Postsurgical changes of the right upper quadrant abdominal wall. Small fat containing umbilical hernia and bilateral inguinal hernias. No bowel containing hernias. Musculoskeletal: No acute traumatic osseous injury in the abdomen or pelvis. Remote right L1, L2 and L3 transverse process fractures are noted. Moderate to severe multilevel discogenic and facet degenerative changes throughout the lumbar  spine with straightening of the normal lumbar lordosis and interspinous arthrosis compatible with Baastrup's disease dedicated spine reconstructions were performed. IMPRESSION: 1. No evidence of acute traumatic injury within the chest, abdomen, or pelvis. 2. Mild circumferential thickening of the bladder wall with faint perivesicular haze, suggestive of cystitis. Correlate with urinalysis. 3. Patient appears to be post hysterectomy and cholecystectomy, with surgical material in the right upper quadrant abdominal wall correlate with surgical history. 4. Coronary atherosclerosis. 5. Thoracolumbar spinal reconstruction imaging was performed, please see dedicated report 6. Aortic Atherosclerosis (ICD10-I70.0). Electronically Signed   By: Kreg Shropshire M.D.   On: 10/20/2019 22:09   Ct Cervical Spine Wo Contrast  Result Date: 10/20/2019 CLINICAL DATA:  73 year old female with head trauma. EXAM: CT HEAD WITHOUT CONTRAST CT CERVICAL SPINE WITHOUT CONTRAST TECHNIQUE: Multidetector CT imaging of the head and cervical spine was performed following the standard protocol without intravenous contrast. Multiplanar CT image reconstructions of the cervical spine were also generated. COMPARISON:  Head CT dated 05/10/2019 FINDINGS: CT HEAD FINDINGS Brain: Mild age-related atrophy and chronic microvascular ischemic changes. There is no acute intracranial hemorrhage. No mass effect or midline shift. No extra-axial fluid collection. Vascular: No hyperdense vessel or unexpected calcification. Skull: Normal. Negative for fracture or focal lesion. Sinuses/Orbits: No acute finding. Other: None CT CERVICAL SPINE FINDINGS Alignment: No acute subluxation Skull base and vertebrae: No acute fracture. Osteopenia. Soft tissues and spinal canal: No prevertebral fluid or swelling. No visible canal hematoma. Disc levels:  No acute findings. Mild degenerative changes. Upper chest: Biapical interstitial streaky densities may represent atelectasis or  edema. Other: Bilateral carotid bulb calcified plaques. IMPRESSION: 1. No acute intracranial hemorrhage. Mild age-related atrophy and chronic microvascular ischemic changes. 2. No acute/traumatic cervical spine pathology. Electronically Signed   By: Elgie Collard M.D.   On: 10/20/2019 21:58   Ct Abdomen Pelvis W Contrast  Result Date: 10/20/2019 CLINICAL DATA:  Abdominal trauma, blunt, found down, headache and abdominal pain EXAM: CT CHEST, ABDOMEN, AND PELVIS WITH CONTRAST TECHNIQUE: Multidetector CT imaging of the chest, abdomen and pelvis was performed following the standard protocol during bolus administration of intravenous contrast. CONTRAST:  OMNIPAQUE IOHEXOL 300 MG/ML  SOLN COMPARISON:  CTA chest May 19, 2017, CT abdomen pelvis June 22, 2017 FINDINGS: CT CHEST FINDINGS Cardiovascular: The aorta is normal caliber. No acute luminal abnormality of the aorta is seen. No periaortic stranding or hemorrhage. Atherosclerotic calcifications are present throughout the normal caliber aorta. Normal 3 vessel  branching of the aortic arch. Minimal plaque within the proximal great vessels. Normal heart size. Trace pericardial fluid is likely within physiologic normal. Atherosclerotic calcification of the coronary arteries. Central pulmonary arteries are normal caliber. No large central filling defects on this non tailored examination. Mediastinum/Nodes: No mediastinal hematoma or pneumomediastinum. No acute traumatic abnormality of the trachea or esophagus. Thyroid gland and thoracic inlet are unremarkable. No mediastinal, hilar or axillary adenopathy. Lungs/Pleura: Bandlike areas of scarring and/or atelectasis are present in the lung bases and apices. Dependent atelectasis posteriorly. No consolidation, features of edema, pneumothorax, or effusion. No suspicious pulmonary nodules or masses. Musculoskeletal: No acute osseous injury or suspicious osseous lesion within the chest. Multilevel degenerative changes  are present in the thoracic spine. Moderate degenerative changes noted in both shoulders features are not significantly changed from comparison studies. No abnormal chest wall hematoma or traumatic soft tissue injury. CT ABDOMEN PELVIS FINDINGS Hepatobiliary: No hepatic injury or perihepatic hematoma. No focal liver abnormality is seen. Patient is post cholecystectomy. Slight prominence of the biliary tree likely related to reservoir effect. No calcified intraductal gallstones. Pancreas: Partial fatty replacement of the pancreas particularly at the level of the pancreatic head. No pancreatic ductal dilatation or convincing peripancreatic inflammation Spleen: No splenic injury or perisplenic hematoma. No suspicious splenic lesions. Adrenals/Urinary Tract: No adrenal hemorrhage or suspicious adrenal lesions. No direct renal injury or perirenal hemorrhage. No extravasation of contrast is seen on excretory phase delayed imaging. Kidneys are otherwise unremarkable, without renal calculi, suspicious lesion, or hydronephrosis. No evidence of bladder injury. Mild circumferential thickening of the bladder wall with faint perivesicular haze. Stomach/Bowel: Distal esophagus stomach and duodenal sweep are unremarkable. No small bowel dilatation or wall thickening. A normal appendix is visualized. No colonic dilatation or wall thickening. Slight redundancy of the sigmoid. No mesenteric hematoma or mesenteric contusion. Vascular/Lymphatic: Atherosclerotic plaque within the normal caliber aorta. No traumatic vascular injury in the abdomen or pelvis. No evidence of active contrast extravasation. Reproductive: Uterus is surgically absent. No concerning adnexal lesions. Other: Postsurgical changes of the right upper quadrant abdominal wall. Small fat containing umbilical hernia and bilateral inguinal hernias. No bowel containing hernias. Musculoskeletal: No acute traumatic osseous injury in the abdomen or pelvis. Remote right L1, L2  and L3 transverse process fractures are noted. Moderate to severe multilevel discogenic and facet degenerative changes throughout the lumbar spine with straightening of the normal lumbar lordosis and interspinous arthrosis compatible with Baastrup's disease dedicated spine reconstructions were performed. IMPRESSION: 1. No evidence of acute traumatic injury within the chest, abdomen, or pelvis. 2. Mild circumferential thickening of the bladder wall with faint perivesicular haze, suggestive of cystitis. Correlate with urinalysis. 3. Patient appears to be post hysterectomy and cholecystectomy, with surgical material in the right upper quadrant abdominal wall correlate with surgical history. 4. Coronary atherosclerosis. 5. Thoracolumbar spinal reconstruction imaging was performed, please see dedicated report 6. Aortic Atherosclerosis (ICD10-I70.0). Electronically Signed   By: Lovena Le M.D.   On: 10/20/2019 22:09   Ct T-spine No Charge  Result Date: 10/20/2019 CLINICAL DATA:  Found down EXAM: CT THORACIC AND LUMBAR SPINE WITHOUT CONTRAST TECHNIQUE: Multiplanar CT images of the thoracic and lumbar spine were reconstructed from contemporary CT of the Chest, Abdomen and Pelvis. COMPARISON:  Lumbar MRI August 11, 2018, CT chest May 20, 2017, CT abdomen pelvis June 22, 2017 FINDINGS: CT THORACIC SPINE FINDINGS Alignment: Preservation of the normal thoracic kyphosis. Mild dextrocurvature. No traumatic listhesis. Posterior elements remain normally aligned. Vertebrae: Diffuse demineralization of the  bones which may limit detection of subtle, nondisplaced fractures. Corticated triangular fragment at the anterior superior corner of T9 may reflect disc calcification versus a limbus vertebrae. No acute fracture or vertebral body height loss. No suspicious osseous lesions. Paraspinal and other soft tissues: No paravertebral fluid or swelling. No visible canal hematoma. For findings within the chest, please see dedicated CT  report. Disc levels: Multilevel discogenic and facet degenerative changes are present throughout the thoracic spine. Features are most pronounced at T10-T11 where a slightly left central posterior disc osteophyte complex effaces the ventral thecal sac and results in mild-to-moderate canal stenosis. No significant neural foraminal narrowing is seen in the thoracic spine. CT LUMBAR SPINE FINDINGS Segmentation: 5 lumbar type vertebrae. Alignment: Straightening of the normal lumbar lordosis with slight degenerative reversal seen at L2-3. Stable grade 1 anterolisthesis L2-L3. No acute traumatic listhesis. Mild dextrocurvature of the lumbar spine, apex at L3-L4. Vertebrae: The bones appear diffusely demineralized. Such finding may limit detection of subtle, nondisplaced fractures. There are multilevel Schmorl's node formations and sclerotic endplate changes on a discogenic basis. Hypertrophic facet changes are noted as well most pronounced at L4-5. Stable postsurgical changes from prior L5-S1 laminectomy. Additional degenerative changes and sclerosis are noted in both SI joints. No disruption of the sacral trabecula. Remote posttraumatic deformity of the right L1 through L3 transverse processes. Paraspinal and other soft tissues: No visible canal hematoma. No acute paraspinal soft tissue swelling or fluid. Disc levels: Level by level evaluation of the lumbar spine below: T12-L1: Disc height loss and vacuum phenomenon. Small calcified global disc bulge with vacuum phenomena. No significant spinal canal or foraminal stenosis. L1-L2: Disc height loss, small global disc bulge with vacuum phenomenon. Facet arthropathy with redundancy of the ligamentum flavum. Mild canal stenosis. No significant foraminal narrowing. L2-L3: Grade 1 anterolisthesis, disc height loss, small global disc bulge and vacuum phenomenon with posterior facet hypertrophy. Moderate canal stenosis, mild bilateral foraminal narrowing L3-L4: Severe disc height  loss, global disc bulge with vacuum phenomenon and sclerotic endplate changes. Mild canal stenosis. Moderate bilateral foraminal narrowing. L4-L5: Disc height loss and vacuum phenomenon with a global disc bulge with central extrusion better seen on comparison MR. Moderate facet hypertrophic changes. Mild canal stenosis with mild bilateral foraminal narrowing. L5-S1: Left laminectomy at L5 and S1. Near complete disc height loss with vacuum phenomenon and calcified posterior spurring and hypertrophic facet changes. Findings result in mild canal stenosis and moderate bilateral foraminal narrowing. IMPRESSION: *Diffuse bony demineralization may limit detection of subtle, nondisplaced fractures. *For acute findings in the chest, abdomen and pelvis, please see dedicated CT report. CT THORACIC SPINE IMPRESSION 1. No acute fracture or traumatic listhesis. 2. 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 3. Limbus vertebrae T9 CT LUMBAR SPINE IMPRESSION 1. No acute fracture or traumatic listhesis. 2. Remote fracture deformity of the right L1-L3 transverse processes. 3. Stable grade 1 anterolisthesis L2 on L3 with focal reversal of lumbar lordosis. 4. Multilevel discogenic and facet degenerative changes through the lumbar spine as detailed level by level above. Findings maximal at L2-3 with moderate canal stenosis. 5. Prior hemilaminectomies at L5-S1. Electronically Signed   By: Kreg Shropshire M.D.   On: 10/20/2019 22:28   Ct L-spine No Charge  Result Date: 10/20/2019 CLINICAL DATA:  Found down EXAM: CT THORACIC AND LUMBAR SPINE WITHOUT CONTRAST TECHNIQUE: Multiplanar CT images of the thoracic and lumbar spine were reconstructed from contemporary CT of the Chest,  Abdomen and Pelvis. COMPARISON:  Lumbar MRI August 11, 2018, CT chest May 20, 2017, CT abdomen pelvis June 22, 2017 FINDINGS: CT THORACIC SPINE FINDINGS Alignment:  Preservation of the normal thoracic kyphosis. Mild dextrocurvature. No traumatic listhesis. Posterior elements remain normally aligned. Vertebrae: Diffuse demineralization of the bones which may limit detection of subtle, nondisplaced fractures. Corticated triangular fragment at the anterior superior corner of T9 may reflect disc calcification versus a limbus vertebrae. No acute fracture or vertebral body height loss. No suspicious osseous lesions. Paraspinal and other soft tissues: No paravertebral fluid or swelling. No visible canal hematoma. For findings within the chest, please see dedicated CT report. Disc levels: Multilevel discogenic and facet degenerative changes are present throughout the thoracic spine. Features are most pronounced at T10-T11 where a slightly left central posterior disc osteophyte complex effaces the ventral thecal sac and results in mild-to-moderate canal stenosis. No significant neural foraminal narrowing is seen in the thoracic spine. CT LUMBAR SPINE FINDINGS Segmentation: 5 lumbar type vertebrae. Alignment: Straightening of the normal lumbar lordosis with slight degenerative reversal seen at L2-3. Stable grade 1 anterolisthesis L2-L3. No acute traumatic listhesis. Mild dextrocurvature of the lumbar spine, apex at L3-L4. Vertebrae: The bones appear diffusely demineralized. Such finding may limit detection of subtle, nondisplaced fractures. There are multilevel Schmorl's node formations and sclerotic endplate changes on a discogenic basis. Hypertrophic facet changes are noted as well most pronounced at L4-5. Stable postsurgical changes from prior L5-S1 laminectomy. Additional degenerative changes and sclerosis are noted in both SI joints. No disruption of the sacral trabecula. Remote posttraumatic deformity of the right L1 through L3 transverse processes. Paraspinal and other soft tissues: No visible canal hematoma. No acute paraspinal soft tissue swelling or fluid. Disc levels: Level by  level evaluation of the lumbar spine below: T12-L1: Disc height loss and vacuum phenomenon. Small calcified global disc bulge with vacuum phenomena. No significant spinal canal or foraminal stenosis. L1-L2: Disc height loss, small global disc bulge with vacuum phenomenon. Facet arthropathy with redundancy of the ligamentum flavum. Mild canal stenosis. No significant foraminal narrowing. L2-L3: Grade 1 anterolisthesis, disc height loss, small global disc bulge and vacuum phenomenon with posterior facet hypertrophy. Moderate canal stenosis, mild bilateral foraminal narrowing L3-L4: Severe disc height loss, global disc bulge with vacuum phenomenon and sclerotic endplate changes. Mild canal stenosis. Moderate bilateral foraminal narrowing. L4-L5: Disc height loss and vacuum phenomenon with a global disc bulge with central extrusion better seen on comparison MR. Moderate facet hypertrophic changes. Mild canal stenosis with mild bilateral foraminal narrowing. L5-S1: Left laminectomy at L5 and S1. Near complete disc height loss with vacuum phenomenon and calcified posterior spurring and hypertrophic facet changes. Findings result in mild canal stenosis and moderate bilateral foraminal narrowing. IMPRESSION: *Diffuse bony demineralization may limit detection of subtle, nondisplaced fractures. *For acute findings in the chest, abdomen and pelvis, please see dedicated CT report. CT THORACIC SPINE IMPRESSION 1. No acute fracture or traumatic listhesis. 2. 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 3. Limbus vertebrae T9 CT LUMBAR SPINE IMPRESSION 1. No acute fracture or traumatic listhesis. 2. Remote fracture deformity of the right L1-L3 transverse processes. 3. Stable grade 1 anterolisthesis L2 on L3 with focal reversal of lumbar lordosis. 4. Multilevel discogenic and facet degenerative changes through the lumbar spine as detailed  level by level above. Findings maximal at L2-3 with moderate canal stenosis. 5. Prior hemilaminectomies at L5-S1. Electronically Signed   By: Samuella Cota  [facility] M.D.   On: 10/20/2019 22:28    EKG:   Orders placed or performed during the hospital encounter of 10/20/19  . ED EKG  . ED EKG    IMPRESSION AND PLAN:  Principal Problem:   UTI (urinary tract infection) -IV antibiotics, urine culture sent Active Problems:   Acute encephalopathy -due to her UTI.  Seems to be somewhat intermittent as when I interviewed her she was alert and oriented   CAD (coronary artery disease) -home meds   Type 2 diabetes mellitus with diabetic neuropathy (HCC) -sliding scale insulin   HTN (hypertension) -home dose antihypertensives   Chart review performed and case discussed with ED provider. Labs, imaging and/or ECG reviewed by provider and discussed with patient/family. Management plans discussed with the patient and/or family.  COVID-19 status: Pending  DVT PROPHYLAXIS: SubQ lovenox   GI PROPHYLAXIS:  PPI   ADMISSION STATUS: Inpatient     CODE STATUS: DNR Code Status History    Date Active Date Inactive Code Status Order ID Comments User Context   12/20/2018 1009 12/23/2018 1900 DNR [id number]  Adrian Saran, MD Inpatient   12/19/2018 2221 12/20/2018 1009 Full Code [id number]  Auburn Bilberry, MD ED   09/11/2018 1748 09/13/2018 1748 DNR [id number]  Altamese Dilling, MD Inpatient   09/10/2018 1635 09/11/2018 1748 Full Code [id number]  Ramonita Lab, MD Inpatient   09/10/2018 1620 09/10/2018 1635 Full Code [id number]  Ramonita Lab, MD ED   07/07/2018 0220 07/07/2018 1910 DNR [id number]  Cammy Copa, MD Inpatient   06/10/2017 1446 06/10/2017 2022 DNR [id number]  Delfino Lovett, MD Inpatient   06/09/2017 1743 06/10/2017 1446 Full Code [id number]  Enid Baas, MD Inpatient   05/19/2017 2144 05/23/2017 1719 DNR [id number]  Oralia Manis, MD Inpatient   02/23/2017 1640 03/01/2017 1902 DNR [id number]  Altamese Dilling,  MD Inpatient   01/17/2017 0407 01/19/2017 2010 DNR [id number]  Arnaldo Natal, MD Inpatient   04/09/2016 0311 04/10/2016 2044 DNR [id number]  Arnaldo Natal, MD Inpatient   04/09/2016 0205 04/09/2016 0311 Full Code [id number]  Oralia Manis, MD Inpatient   04/03/2016 1853 04/04/2016 1817 Full Code [id number]  Houston Siren, MD Inpatient   02/23/2016 2252 02/25/2016 1637 DNR [id number]  Robley Fries, MD Inpatient   01/24/2016 1554 01/27/2016 1906 DNR [id number]  Milagros Loll, MD Inpatient   01/23/2016 1607 01/23/2016 1622 Full Code [id number]  Shaune Pollack, MD Inpatient   12/07/2015 2223 12/09/2015 1858 Full Code [id number]  Auburn Bilberry, MD Inpatient   11/29/2015 1741 12/01/2015 1833 DNR [id number]  Shaune Pollack, MD Inpatient   11/10/2015 0954 11/12/2015 2139 DNR [id number]  Linus Galas, MD Inpatient   11/07/2015 1732 11/10/2015 0954 DNR [id number]  Altamese Dilling, MD Inpatient   11/07/2015 1549 11/07/2015 1732 Full Code [id number]  Altamese Dilling, MD Inpatient   Advance Care Planning Activity    Questions for Most Recent Historical Code Status (Order [id number])    Question Answer Comment   In the event of cardiac or respiratory ARREST Do not call a "code blue"    In the event of cardiac or respiratory ARREST Do not perform Intubation, CPR, defibrillation or ACLS    In the event of cardiac or respiratory ARREST Use medication by any route, position, wound care, and other measures to relive pain and suffering. May use oxygen, suction and manual treatment of airway obstruction as needed for comfort.       TOTAL TIME TAKING CARE OF THIS PATIENT: 45 minutes.   This patient was evaluated in  the context of the global COVID-19 pandemic, which necessitated consideration that the patient might be at risk for infection with the SARS-CoV-2 virus that causes COVID-19. Institutional protocols and algorithms that pertain to the evaluation of patients at risk for COVID-19 are in a state of rapid change  based on information released by regulatory bodies including the CDC and federal and state organizations. These policies and algorithms were followed to the best of this provider's knowledge to date during the patient's care at this facility.  Barney Drain 10/21/2019, 12:14 AM  [facility]  Office  [phone]  CC: Primary care physician; Garlon Hatchet, MD  Note:  This document was prepared using Dragon voice recognition software and may include unintentional dictation errors.

## 2019-10-21 NOTE — ED Notes (Signed)
Went to introduce self to pt- pt was lethargic, but able to answer questions

## 2019-10-21 NOTE — ED Notes (Signed)
Pt given breakfast tray and blanket- pt requesting to have her left heel wrapped as it is has a blister on it and it is rubbing on the bed- pt also states that she is allergic to eggs, will call for something else to eat

## 2019-10-22 ENCOUNTER — Other Ambulatory Visit: Payer: Self-pay

## 2019-10-22 DIAGNOSIS — N3000 Acute cystitis without hematuria: Principal | ICD-10-CM

## 2019-10-22 DIAGNOSIS — G9340 Encephalopathy, unspecified: Secondary | ICD-10-CM

## 2019-10-22 DIAGNOSIS — I10 Essential (primary) hypertension: Secondary | ICD-10-CM

## 2019-10-22 DIAGNOSIS — I25118 Atherosclerotic heart disease of native coronary artery with other forms of angina pectoris: Secondary | ICD-10-CM

## 2019-10-22 DIAGNOSIS — E1140 Type 2 diabetes mellitus with diabetic neuropathy, unspecified: Secondary | ICD-10-CM

## 2019-10-22 LAB — BASIC METABOLIC PANEL
Anion gap: 11 (ref 5–15)
BUN: 10 mg/dL (ref 8–23)
CO2: 25 mmol/L (ref 22–32)
Calcium: 8.5 mg/dL — ABNORMAL LOW (ref 8.9–10.3)
Chloride: 102 mmol/L (ref 98–111)
Creatinine, Ser: 0.61 mg/dL (ref 0.44–1.00)
GFR calc Af Amer: >60 mL/min (ref >60)
GFR calc non Af Amer: >60 mL/min (ref >60)
Glucose, Bld: 124 mg/dL — ABNORMAL HIGH (ref 70–99)
Potassium: 4.3 mmol/L (ref 3.5–5.1)
Sodium: 138 mmol/L (ref 135–145)

## 2019-10-22 LAB — CK: Total CK: 102 U/L (ref 38–234)

## 2019-10-22 LAB — GLUCOSE, CAPILLARY
Glucose-Capillary: 159 mg/dL — ABNORMAL HIGH (ref 70–99)
Glucose-Capillary: 165 mg/dL — ABNORMAL HIGH (ref 70–99)
Glucose-Capillary: 227 mg/dL — ABNORMAL HIGH (ref 70–99)
Glucose-Capillary: 185 mg/dL — ABNORMAL HIGH (ref 70–99)

## 2019-10-22 MED ORDER — SENNOSIDES-DOCUSATE SODIUM 8.6-50 MG PO TABS
1.0000 | ORAL_TABLET | Freq: Two times a day (BID) | ORAL | Status: AC
  Administered 2019-10-22 (×2): 1 via ORAL
  Filled 2019-10-22 (×4): qty 1

## 2019-10-22 MED ORDER — PHENAZOPYRIDINE HCL 100 MG PO TABS
100.0000 mg | ORAL_TABLET | Freq: Three times a day (TID) | ORAL | Status: AC
  Administered 2019-10-22 – 2019-10-23 (×4): 100 mg via ORAL
  Filled 2019-10-22 (×6): qty 1

## 2019-10-22 MED ORDER — SODIUM CHLORIDE 0.9 % IV SOLN
INTRAVENOUS | Status: DC | PRN
  Administered 2019-10-22 – 2019-10-24 (×3): 250 mL via INTRAVENOUS

## 2019-10-22 NOTE — Progress Notes (Signed)
PROGRESS NOTE    Jessica Donovan  ZOX:[id number] DOB: 04-09-1946 DOA: 10/20/2019 PCP: Garlon Hatchet, MD  Brief Narrative:   Assessment & Plan: 73 year old lady with past medical history significant for diabetes, hypertension and chronic back pain was admitted yesterday with altered mental status secondary to UTI.   Principal Problem:   UTI (urinary tract infection) Active Problems:   CAD (coronary artery disease)   Type 2 diabetes mellitus with diabetic neuropathy (HCC)   Acute encephalopathy   HTN (hypertension)  UTI.  Continue ceftriaxone while urine culture results are pending.  Encephalopathy.  Resolved.  CAD.  No chest pain or shortness of breath. -Continue home meds.  Hypertension.  Continue home meds.  Diabetes.  Uncontrolled with A1c of 8.8. -Continue Lantus 50 units daily. -Continue SSI.  DVT prophylaxis: Lovenox Code Status: DNR Family Communication:  NO "discussed with patient. Disposition Plan: Discharge home in 1 to 2 days.  Consultants:   None  Antimicrobials:  Ceftriaxone-day 2  Subjective: Patient was complaining of burning micturition and lower abdominal pain.  Objective: Vitals:   10/22/19 0954 10/22/19 1001 10/22/19 1145 10/22/19 1231  BP: (!) 144/53 (!) 136/59 (!) 110/58 (!) 98/53  Pulse: 94 89 94 96  Resp: Temp:  99.1 F (37.3 C)  98.6 F (37 C)  TempSrc:  Oral  Oral  SpO2: 95% 95% 97% 93%  Weight:      Height:        Intake/Output Summary (Last 24 hours) at 10/22/2019 1609 Last data filed at 10/22/2019 0248 Gross per 24 hour  Intake 100 ml  Output 1000 ml  Net -900 ml   Filed Weights   10/20/19 2111  Weight: 88.5 kg   Examination:  General exam: Appears calm and comfortable  Respiratory system: Clear to auscultation. Respiratory effort normal. Cardiovascular system: S1 & S2 heard, RRR. No JVD, murmurs, rubs, gallops or clicks. No pedal edema. Gastrointestinal system: Abdomen is nondistended, mild lower  abdominal tenderness. No organomegaly or masses felt. Normal bowel sounds heard. Central nervous system: Alert and oriented. No focal neurological deficits. Extremities: Left foot with clean bandage around heels and toes. Psychiatry: Judgement and insight appear normal. Mood & affect appropriate.   Data Reviewed: I have personally reviewed following labs and imaging studies  CBC: Recent Labs  Lab 10/20/19 2018 10/21/19 0609  WBC 15.1* 13.3*  NEUTROABS 11.1*  --   HGB 12.4 12.5  HCT 38.7 39.6  MCV 98.5 100.0  PLT 259 234   Basic Metabolic Panel: Recent Labs  Lab 10/20/19 2018 10/21/19 0609 10/22/19 0507  NA 135 141 138  K 4.6 5.2* 4.3  CL 100 105 102  CO2 GLUCOSE 216* 229* 124*  BUN 33* 22 10  CREATININE 1.14* 0.85 0.61  CALCIUM 9.9 9.6 8.5*   GFR: Estimated Creatinine Clearance: 72.9 mL/min (by C-G formula based on SCr of 0.61 mg/dL). Liver Function Tests: Recent Labs  Lab 10/20/19 2018  AST 31  ALT 16  ALKPHOS 71  BILITOT 0.6  PROT 6.3*  ALBUMIN 3.7   No results for input(s): LIPASE, AMYLASE in the last 168 hours. No results for input(s): AMMONIA in the last 168 hours. Coagulation Profile: Recent Labs  Lab 10/20/19 2018  INR 1.1   Cardiac Enzymes: Recent Labs  Lab 10/20/19 2018 10/22/19 0507  CKTOTAL 612* 102   BNP (last 3 results) No results for input(s): PROBNP in the last 8760 hours. HbA1C: Recent Labs  10/20/19 2018  HGBA1C 8.8*   CBG: Recent Labs  Lab 10/21/19 1202 10/21/19 1813 10/21/19 2056 10/22/19 0958 10/22/19 1250  GLUCAP 264* 252* 231* 159* 165*   Lipid Profile: No results for input(s): CHOL, HDL, LDLCALC, TRIG, CHOLHDL, LDLDIRECT in the last 72 hours. Thyroid Function Tests: No results for input(s): TSH, T4TOTAL, FREET4, T3FREE, THYROIDAB in the last 72 hours. Anemia Panel: No results for input(s): VITAMINB12, FOLATE, FERRITIN, TIBC, IRON, RETICCTPCT in the last 72 hours. Sepsis Labs: Recent Labs  Lab  10/21/19 0021  LATICACIDVEN 1.4    Recent Results (from the past 240 hour(s))  Urine culture     Status: Abnormal (Preliminary result)   Collection Time: 10/20/19 10:30 PM   Specimen: Urine, Random  Result Value Ref Range Status   Specimen Description   Final    URINE, RANDOM Performed at [facility], [address] [place], [place] [address]    Special Requests   Final    NONE Performed at [facility], [address], [place], [place] [address]    Culture (A)  Final    >=100,000 COLONIES/mL ESCHERICHIA COLI SUSCEPTIBILITIES TO FOLLOW Performed at [facility], [address] [place], [place] [address]    Report Status PENDING  Incomplete  SARS CORONAVIRUS 2 (TAT 6-24 HRS) Nasopharyngeal Nasopharyngeal Swab     Status: None   Collection Time: 10/21/19 12:21 AM   Specimen: Nasopharyngeal Swab  Result Value Ref Range Status   SARS Coronavirus 2 NEGATIVE NEGATIVE Final    Comment: (NOTE) SARS-CoV-2 target nucleic acids are NOT DETECTED. The SARS-CoV-2 RNA is generally detectable in upper and lower respiratory specimens during the acute phase of infection. Negative results do not preclude SARS-CoV-2 infection, do not rule out co-infections with other pathogens, and should not be used as the sole basis for treatment or other patient management decisions. Negative results must be combined with clinical observations, patient history, and epidemiological information. The expected result is Negative. Fact Sheet for Patients: HairSlick.no Fact Sheet for Healthcare Providers: quierodirigir.com This test is not yet approved or cleared by the [place] FDA and  has been authorized for detection and/or diagnosis of SARS-CoV-2 by FDA under an Emergency Use Authorization (EUA). This EUA will remain  in effect (meaning this test can be used) for the duration of the COVID-19 declaration under Section  56 4(b)(1) of the Act, 21 U.S.C. section 360bbb-3(b)(1), unless the authorization is terminated or revoked sooner. Performed at [facility], [address] [place], [place] [address]          Radiology Studies: Ct Head Wo Contrast  Result Date: 10/20/2019 CLINICAL DATA:  73 year old female with head trauma. EXAM: CT HEAD WITHOUT CONTRAST CT CERVICAL SPINE WITHOUT CONTRAST TECHNIQUE: Multidetector CT imaging of the head and cervical spine was performed following the standard protocol without intravenous contrast. Multiplanar CT image reconstructions of the cervical spine were also generated. COMPARISON:  Head CT dated 05/10/2019 FINDINGS: CT HEAD FINDINGS Brain: Mild age-related atrophy and chronic microvascular ischemic changes. There is no acute intracranial hemorrhage. No mass effect or midline shift. No extra-axial fluid collection. Vascular: No hyperdense vessel or unexpected calcification. Skull: Normal. Negative for fracture or focal lesion. Sinuses/Orbits: No acute finding. Other: None CT CERVICAL SPINE FINDINGS Alignment: No acute subluxation Skull base and vertebrae: No acute fracture. Osteopenia. Soft tissues and spinal canal: No prevertebral fluid or swelling. No visible canal hematoma. Disc levels:  No acute findings. Mild degenerative changes. Upper chest:  Biapical interstitial streaky densities may represent atelectasis or edema. Other: Bilateral carotid bulb calcified plaques. IMPRESSION: 1. No acute intracranial hemorrhage. Mild age-related atrophy and chronic microvascular ischemic changes. 2. No acute/traumatic cervical spine pathology. Electronically Signed   By: Anner Crete M.D.   On: 10/20/2019 21:58   Ct Chest W Contrast  Result Date: 10/20/2019 CLINICAL DATA:  Abdominal trauma, blunt, found down, headache and abdominal pain EXAM: CT CHEST, ABDOMEN, AND PELVIS WITH CONTRAST TECHNIQUE: Multidetector CT imaging of the chest, abdomen and pelvis was performed  following the standard protocol during bolus administration of intravenous contrast. CONTRAST:  135mL OMNIPAQUE IOHEXOL 300 MG/ML  SOLN COMPARISON:  CTA chest May 19, 2017, CT abdomen pelvis June 22, 2017 FINDINGS: CT CHEST FINDINGS Cardiovascular: The aorta is normal caliber. No acute luminal abnormality of the aorta is seen. No periaortic stranding or hemorrhage. Atherosclerotic calcifications are present throughout the normal caliber aorta. Normal 3 vessel branching of the aortic arch. Minimal plaque within the proximal great vessels. Normal heart size. Trace pericardial fluid is likely within physiologic normal. Atherosclerotic calcification of the coronary arteries. Central pulmonary arteries are normal caliber. No large central filling defects on this non tailored examination. Mediastinum/Nodes: No mediastinal hematoma or pneumomediastinum. No acute traumatic abnormality of the trachea or esophagus. Thyroid gland and thoracic inlet are unremarkable. No mediastinal, hilar or axillary adenopathy. Lungs/Pleura: Bandlike areas of scarring and/or atelectasis are present in the lung bases and apices. Dependent atelectasis posteriorly. No consolidation, features of edema, pneumothorax, or effusion. No suspicious pulmonary nodules or masses. Musculoskeletal: No acute osseous injury or suspicious osseous lesion within the chest. Multilevel degenerative changes are present in the thoracic spine. Moderate degenerative changes noted in both shoulders features are not significantly changed from comparison studies. No abnormal chest wall hematoma or traumatic soft tissue injury. CT ABDOMEN PELVIS FINDINGS Hepatobiliary: No hepatic injury or perihepatic hematoma. No focal liver abnormality is seen. Patient is post cholecystectomy. Slight prominence of the biliary tree likely related to reservoir effect. No calcified intraductal gallstones. Pancreas: Partial fatty replacement of the pancreas particularly at the level of the  pancreatic head. No pancreatic ductal dilatation or convincing peripancreatic inflammation Spleen: No splenic injury or perisplenic hematoma. No suspicious splenic lesions. Adrenals/Urinary Tract: No adrenal hemorrhage or suspicious adrenal lesions. No direct renal injury or perirenal hemorrhage. No extravasation of contrast is seen on excretory phase delayed imaging. Kidneys are otherwise unremarkable, without renal calculi, suspicious lesion, or hydronephrosis. No evidence of bladder injury. Mild circumferential thickening of the bladder wall with faint perivesicular haze. Stomach/Bowel: Distal esophagus stomach and duodenal sweep are unremarkable. No small bowel dilatation or wall thickening. A normal appendix is visualized. No colonic dilatation or wall thickening. Slight redundancy of the sigmoid. No mesenteric hematoma or mesenteric contusion. Vascular/Lymphatic: Atherosclerotic plaque within the normal caliber aorta. No traumatic vascular injury in the abdomen or pelvis. No evidence of active contrast extravasation. Reproductive: Uterus is surgically absent. No concerning adnexal lesions. Other: Postsurgical changes of the right upper quadrant abdominal wall. Small fat containing umbilical hernia and bilateral inguinal hernias. No bowel containing hernias. Musculoskeletal: No acute traumatic osseous injury in the abdomen or pelvis. Remote right L1, L2 and L3 transverse process fractures are noted. Moderate to severe multilevel discogenic and facet degenerative changes throughout the lumbar spine with straightening of the normal lumbar lordosis and interspinous arthrosis compatible with Baastrup's disease dedicated spine reconstructions were performed. IMPRESSION: 1. No evidence of acute traumatic injury within the chest, abdomen, or pelvis. 2. Mild circumferential  thickening of the bladder wall with faint perivesicular haze, suggestive of cystitis. Correlate with urinalysis. 3. Patient appears to be post  hysterectomy and cholecystectomy, with surgical material in the right upper quadrant abdominal wall correlate with surgical history. 4. Coronary atherosclerosis. 5. Thoracolumbar spinal reconstruction imaging was performed, please see dedicated report 6. Aortic Atherosclerosis (ICD10-I70.0). Electronically Signed   By: Kreg Shropshire M.D.   On: 10/20/2019 22:09   Ct Cervical Spine Wo Contrast  Result Date: 10/20/2019 CLINICAL DATA:  73 year old female with head trauma. EXAM: CT HEAD WITHOUT CONTRAST CT CERVICAL SPINE WITHOUT CONTRAST TECHNIQUE: Multidetector CT imaging of the head and cervical spine was performed following the standard protocol without intravenous contrast. Multiplanar CT image reconstructions of the cervical spine were also generated. COMPARISON:  Head CT dated 05/10/2019 FINDINGS: CT HEAD FINDINGS Brain: Mild age-related atrophy and chronic microvascular ischemic changes. There is no acute intracranial hemorrhage. No mass effect or midline shift. No extra-axial fluid collection. Vascular: No hyperdense vessel or unexpected calcification. Skull: Normal. Negative for fracture or focal lesion. Sinuses/Orbits: No acute finding. Other: None CT CERVICAL SPINE FINDINGS Alignment: No acute subluxation Skull base and vertebrae: No acute fracture. Osteopenia. Soft tissues and spinal canal: No prevertebral fluid or swelling. No visible canal hematoma. Disc levels:  No acute findings. Mild degenerative changes. Upper chest: Biapical interstitial streaky densities may represent atelectasis or edema. Other: Bilateral carotid bulb calcified plaques. IMPRESSION: 1. No acute intracranial hemorrhage. Mild age-related atrophy and chronic microvascular ischemic changes. 2. No acute/traumatic cervical spine pathology. Electronically Signed   By: Elgie Collard M.D.   On: 10/20/2019 21:58   Ct Abdomen Pelvis W Contrast  Result Date: 10/20/2019 CLINICAL DATA:  Abdominal trauma, blunt, found down, headache and  abdominal pain EXAM: CT CHEST, ABDOMEN, AND PELVIS WITH CONTRAST TECHNIQUE: Multidetector CT imaging of the chest, abdomen and pelvis was performed following the standard protocol during bolus administration of intravenous contrast. CONTRAST:  OMNIPAQUE IOHEXOL 300 MG/ML  SOLN COMPARISON:  CTA chest May 19, 2017, CT abdomen pelvis June 22, 2017 FINDINGS: CT CHEST FINDINGS Cardiovascular: The aorta is normal caliber. No acute luminal abnormality of the aorta is seen. No periaortic stranding or hemorrhage. Atherosclerotic calcifications are present throughout the normal caliber aorta. Normal 3 vessel branching of the aortic arch. Minimal plaque within the proximal great vessels. Normal heart size. Trace pericardial fluid is likely within physiologic normal. Atherosclerotic calcification of the coronary arteries. Central pulmonary arteries are normal caliber. No large central filling defects on this non tailored examination. Mediastinum/Nodes: No mediastinal hematoma or pneumomediastinum. No acute traumatic abnormality of the trachea or esophagus. Thyroid gland and thoracic inlet are unremarkable. No mediastinal, hilar or axillary adenopathy. Lungs/Pleura: Bandlike areas of scarring and/or atelectasis are present in the lung bases and apices. Dependent atelectasis posteriorly. No consolidation, features of edema, pneumothorax, or effusion. No suspicious pulmonary nodules or masses. Musculoskeletal: No acute osseous injury or suspicious osseous lesion within the chest. Multilevel degenerative changes are present in the thoracic spine. Moderate degenerative changes noted in both shoulders features are not significantly changed from comparison studies. No abnormal chest wall hematoma or traumatic soft tissue injury. CT ABDOMEN PELVIS FINDINGS Hepatobiliary: No hepatic injury or perihepatic hematoma. No focal liver abnormality is seen. Patient is post cholecystectomy. Slight prominence of the biliary tree likely  related to reservoir effect. No calcified intraductal gallstones. Pancreas: Partial fatty replacement of the pancreas particularly at the level of the pancreatic head. No pancreatic ductal dilatation or convincing peripancreatic inflammation Spleen:  No splenic injury or perisplenic hematoma. No suspicious splenic lesions. Adrenals/Urinary Tract: No adrenal hemorrhage or suspicious adrenal lesions. No direct renal injury or perirenal hemorrhage. No extravasation of contrast is seen on excretory phase delayed imaging. Kidneys are otherwise unremarkable, without renal calculi, suspicious lesion, or hydronephrosis. No evidence of bladder injury. Mild circumferential thickening of the bladder wall with faint perivesicular haze. Stomach/Bowel: Distal esophagus stomach and duodenal sweep are unremarkable. No small bowel dilatation or wall thickening. A normal appendix is visualized. No colonic dilatation or wall thickening. Slight redundancy of the sigmoid. No mesenteric hematoma or mesenteric contusion. Vascular/Lymphatic: Atherosclerotic plaque within the normal caliber aorta. No traumatic vascular injury in the abdomen or pelvis. No evidence of active contrast extravasation. Reproductive: Uterus is surgically absent. No concerning adnexal lesions. Other: Postsurgical changes of the right upper quadrant abdominal wall. Small fat containing umbilical hernia and bilateral inguinal hernias. No bowel containing hernias. Musculoskeletal: No acute traumatic osseous injury in the abdomen or pelvis. Remote right L1, L2 and L3 transverse process fractures are noted. Moderate to severe multilevel discogenic and facet degenerative changes throughout the lumbar spine with straightening of the normal lumbar lordosis and interspinous arthrosis compatible with Baastrup's disease dedicated spine reconstructions were performed. IMPRESSION: 1. No evidence of acute traumatic injury within the chest, abdomen, or pelvis. 2. Mild  circumferential thickening of the bladder wall with faint perivesicular haze, suggestive of cystitis. Correlate with urinalysis. 3. Patient appears to be post hysterectomy and cholecystectomy, with surgical material in the right upper quadrant abdominal wall correlate with surgical history. 4. Coronary atherosclerosis. 5. Thoracolumbar spinal reconstruction imaging was performed, please see dedicated report 6. Aortic Atherosclerosis (ICD10-I70.0). Electronically Signed   By: Kreg ShropshirePrice  DeHay M.D.   On: 10/20/2019 22:09   Ct T-spine No Charge  Result Date: 10/20/2019 CLINICAL DATA:  Found down EXAM: CT THORACIC AND LUMBAR SPINE WITHOUT CONTRAST TECHNIQUE: Multiplanar CT images of the thoracic and lumbar spine were reconstructed from contemporary CT of the Chest, Abdomen and Pelvis. COMPARISON:  Lumbar MRI August 11, 2018, CT chest May 20, 2017, CT abdomen pelvis June 22, 2017 FINDINGS: CT THORACIC SPINE FINDINGS Alignment: Preservation of the normal thoracic kyphosis. Mild dextrocurvature. No traumatic listhesis. Posterior elements remain normally aligned. Vertebrae: Diffuse demineralization of the bones which may limit detection of subtle, nondisplaced fractures. Corticated triangular fragment at the anterior superior corner of T9 may reflect disc calcification versus a limbus vertebrae. No acute fracture or vertebral body height loss. No suspicious osseous lesions. Paraspinal and other soft tissues: No paravertebral fluid or swelling. No visible canal hematoma. For findings within the chest, please see dedicated CT report. Disc levels: Multilevel discogenic and facet degenerative changes are present throughout the thoracic spine. Features are most pronounced at T10-T11 where a slightly left central posterior disc osteophyte complex effaces the ventral thecal sac and results in mild-to-moderate canal stenosis. No significant neural foraminal narrowing is seen in the thoracic spine. CT LUMBAR SPINE FINDINGS  Segmentation: 5 lumbar type vertebrae. Alignment: Straightening of the normal lumbar lordosis with slight degenerative reversal seen at L2-3. Stable grade 1 anterolisthesis L2-L3. No acute traumatic listhesis. Mild dextrocurvature of the lumbar spine, apex at L3-L4. Vertebrae: The bones appear diffusely demineralized. Such finding may limit detection of subtle, nondisplaced fractures. There are multilevel Schmorl's node formations and sclerotic endplate changes on a discogenic basis. Hypertrophic facet changes are noted as well most pronounced at L4-5. Stable postsurgical changes from prior L5-S1 laminectomy. Additional degenerative changes and sclerosis are noted in both  SI joints. No disruption of the sacral trabecula. Remote posttraumatic deformity of the right L1 through L3 transverse processes. Paraspinal and other soft tissues: No visible canal hematoma. No acute paraspinal soft tissue swelling or fluid. Disc levels: Level by level evaluation of the lumbar spine below: T12-L1: Disc height loss and vacuum phenomenon. Small calcified global disc bulge with vacuum phenomena. No significant spinal canal or foraminal stenosis. L1-L2: Disc height loss, small global disc bulge with vacuum phenomenon. Facet arthropathy with redundancy of the ligamentum flavum. Mild canal stenosis. No significant foraminal narrowing. L2-L3: Grade 1 anterolisthesis, disc height loss, small global disc bulge and vacuum phenomenon with posterior facet hypertrophy. Moderate canal stenosis, mild bilateral foraminal narrowing L3-L4: Severe disc height loss, global disc bulge with vacuum phenomenon and sclerotic endplate changes. Mild canal stenosis. Moderate bilateral foraminal narrowing. L4-L5: Disc height loss and vacuum phenomenon with a global disc bulge with central extrusion better seen on comparison MR. Moderate facet hypertrophic changes. Mild canal stenosis with mild bilateral foraminal narrowing. L5-S1: Left laminectomy at L5 and  S1. Near complete disc height loss with vacuum phenomenon and calcified posterior spurring and hypertrophic facet changes. Findings result in mild canal stenosis and moderate bilateral foraminal narrowing. IMPRESSION: *Diffuse bony demineralization may limit detection of subtle, nondisplaced fractures. *For acute findings in the chest, abdomen and pelvis, please see dedicated CT report. CT THORACIC SPINE IMPRESSION 1. No acute fracture or traumatic listhesis. 2. 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 3. Limbus vertebrae T9 CT LUMBAR SPINE IMPRESSION 1. No acute fracture or traumatic listhesis. 2. Remote fracture deformity of the right L1-L3 transverse processes. 3. Stable grade 1 anterolisthesis L2 on L3 with focal reversal of lumbar lordosis. 4. Multilevel discogenic and facet degenerative changes through the lumbar spine as detailed level by level above. Findings maximal at L2-3 with moderate canal stenosis. 5. Prior hemilaminectomies at L5-S1. Electronically Signed   By: Kreg Shropshire M.D.   On: 10/20/2019 22:28   Ct L-spine No Charge  Result Date: 10/20/2019 CLINICAL DATA:  Found down EXAM: CT THORACIC AND LUMBAR SPINE WITHOUT CONTRAST TECHNIQUE: Multiplanar CT images of the thoracic and lumbar spine were reconstructed from contemporary CT of the Chest, Abdomen and Pelvis. COMPARISON:  Lumbar MRI August 11, 2018, CT chest May 20, 2017, CT abdomen pelvis June 22, 2017 FINDINGS: CT THORACIC SPINE FINDINGS Alignment: Preservation of the normal thoracic kyphosis. Mild dextrocurvature. No traumatic listhesis. Posterior elements remain normally aligned. Vertebrae: Diffuse demineralization of the bones which may limit detection of subtle, nondisplaced fractures. Corticated triangular fragment at the anterior superior corner of T9 may reflect disc calcification versus a limbus vertebrae. No acute fracture or vertebral  body height loss. No suspicious osseous lesions. Paraspinal and other soft tissues: No paravertebral fluid or swelling. No visible canal hematoma. For findings within the chest, please see dedicated CT report. Disc levels: Multilevel discogenic and facet degenerative changes are present throughout the thoracic spine. Features are most pronounced at T10-T11 where a slightly left central posterior disc osteophyte complex effaces the ventral thecal sac and results in mild-to-moderate canal stenosis. No significant neural foraminal narrowing is seen in the thoracic spine. CT LUMBAR SPINE FINDINGS Segmentation: 5 lumbar type vertebrae. Alignment: Straightening of the normal lumbar lordosis with slight degenerative reversal seen at L2-3. Stable grade 1 anterolisthesis L2-L3. No acute traumatic listhesis. Mild dextrocurvature of the lumbar spine, apex at L3-L4. Vertebrae: The bones appear diffusely demineralized. Such finding  may limit detection of subtle, nondisplaced fractures. There are multilevel Schmorl's node formations and sclerotic endplate changes on a discogenic basis. Hypertrophic facet changes are noted as well most pronounced at L4-5. Stable postsurgical changes from prior L5-S1 laminectomy. Additional degenerative changes and sclerosis are noted in both SI joints. No disruption of the sacral trabecula. Remote posttraumatic deformity of the right L1 through L3 transverse processes. Paraspinal and other soft tissues: No visible canal hematoma. No acute paraspinal soft tissue swelling or fluid. Disc levels: Level by level evaluation of the lumbar spine below: T12-L1: Disc height loss and vacuum phenomenon. Small calcified global disc bulge with vacuum phenomena. No significant spinal canal or foraminal stenosis. L1-L2: Disc height loss, small global disc bulge with vacuum phenomenon. Facet arthropathy with redundancy of the ligamentum flavum. Mild canal stenosis. No significant foraminal narrowing. L2-L3: Grade  1 anterolisthesis, disc height loss, small global disc bulge and vacuum phenomenon with posterior facet hypertrophy. Moderate canal stenosis, mild bilateral foraminal narrowing L3-L4: Severe disc height loss, global disc bulge with vacuum phenomenon and sclerotic endplate changes. Mild canal stenosis. Moderate bilateral foraminal narrowing. L4-L5: Disc height loss and vacuum phenomenon with a global disc bulge with central extrusion better seen on comparison MR. Moderate facet hypertrophic changes. Mild canal stenosis with mild bilateral foraminal narrowing. L5-S1: Left laminectomy at L5 and S1. Near complete disc height loss with vacuum phenomenon and calcified posterior spurring and hypertrophic facet changes. Findings result in mild canal stenosis and moderate bilateral foraminal narrowing. IMPRESSION: *Diffuse bony demineralization may limit detection of subtle, nondisplaced fractures. *For acute findings in the chest, abdomen and pelvis, please see dedicated CT report. CT THORACIC SPINE IMPRESSION 1. No acute fracture or traumatic listhesis. 2. 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 3. Limbus vertebrae T9 CT LUMBAR SPINE IMPRESSION 1. No acute fracture or traumatic listhesis. 2. Remote fracture deformity of the right L1-L3 transverse processes. 3. Stable grade 1 anterolisthesis L2 on L3 with focal reversal of lumbar lordosis. 4. Multilevel discogenic and facet degenerative changes through the lumbar spine as detailed level by level above. Findings maximal at L2-3 with moderate canal stenosis. 5. Prior hemilaminectomies at L5-S1. Electronically Signed   By: Kreg Shropshire M.D.   On: 10/20/2019 22:28   Scheduled Meds: . ALPRAZolam  0.5 mg Oral TID  . aspirin EC  81 mg Oral Daily  . atorvastatin  20 mg Oral Daily  . enoxaparin (LOVENOX) injection  40 mg Subcutaneous Q24H  . insulin aspart  0-5 Units  Subcutaneous QHS  . insulin aspart  0-9 Units Subcutaneous TID WC  . insulin glargine  50 Units Subcutaneous Daily  . isosorbide mononitrate  30 mg Oral Daily  . lisinopril  20 mg Oral BID  . mirtazapine  15 mg Oral QHS  . morphine  15 mg Oral TID  . pantoprazole  40 mg Oral Daily  . phenazopyridine  100 mg Oral TID WC  . senna-docusate  1 tablet Oral BID  . traZODone  100 mg Oral QHS   Continuous Infusions: . cefTRIAXone (ROCEPHIN)  IV Stopped (10/22/19 0248)     LOS: 1 day   Time spent: 45 minutes  Arnetha Courser, MD [facility] Pager [phone]  If 7PM-7AM, please contact night-coverage [email or web address] Password [email or web address] 10/22/2019, 4:09 PM

## 2019-10-22 NOTE — ED Notes (Signed)
Notified oncoming RN that patient is requesting that admitting doc put in more pain medication

## 2019-10-22 NOTE — ED Notes (Signed)
Changed pt's linen, placed pt in an clean hospital gown. Provided perineal care, applied new incontinent pad, pt able to move side to side and reposition pt in bed. Administered medications as ordered, pt's breakfast had egg, pt has allergy to it RN called dietary and will bring new breakfast tray for pt.

## 2019-10-23 DIAGNOSIS — L8990 Pressure ulcer of unspecified site, unspecified stage: Secondary | ICD-10-CM | POA: Diagnosis present

## 2019-10-23 DIAGNOSIS — W19XXXA Unspecified fall, initial encounter: Secondary | ICD-10-CM

## 2019-10-23 LAB — GLUCOSE, CAPILLARY
Glucose-Capillary: 115 mg/dL — ABNORMAL HIGH (ref 70–99)
Glucose-Capillary: 189 mg/dL — ABNORMAL HIGH (ref 70–99)
Glucose-Capillary: 178 mg/dL — ABNORMAL HIGH (ref 70–99)
Glucose-Capillary: 205 mg/dL — ABNORMAL HIGH (ref 70–99)

## 2019-10-23 LAB — URINE CULTURE: Culture: >=100000 — AB

## 2019-10-23 MED ORDER — CEFAZOLIN SODIUM-DEXTROSE 1-4 GM/50ML-% IV SOLN
1.0000 g | Freq: Three times a day (TID) | INTRAVENOUS | Status: DC
  Administered 2019-10-23 – 2019-10-24 (×2): 1 g via INTRAVENOUS
  Filled 2019-10-23 (×3): qty 50

## 2019-10-23 MED ORDER — POLYVINYL ALCOHOL 1.4 % OP SOLN
1.0000 [drp] | OPHTHALMIC | Status: DC | PRN
  Administered 2019-10-23: 21:00:00 1 [drp] via OPHTHALMIC
  Filled 2019-10-23: qty 15

## 2019-10-23 NOTE — Evaluation (Signed)
Physical Therapy Evaluation Patient Details Name: Jessica Donovan MRN: [id number] DOB: 1946/10/15 Today's Date: 10/23/2019   History of Present Illness  Pt is a 73 year old female admitted with AMS and UTI.  PMH includes heel wound due to dog attack, osteomyelitis, CVA, DM and chronic back pain.  Clinical Impression  Pt is a 73 year old female who lives in a single story home alone.  She is supine in bed upon PT arrival and states that she does not feel she needs PT.  Pt is very talkative and perseverates on multiple medical hx, but does pleasantly agree to therapy after some education.  Pt able to perform bed mobility and STS mod I with use of RW.  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  Pt ambulated 40 ft with RW, supervision without difficulty.  She does require UE assist to perform standing ADL's and presents with mild balance deficits when reaching outside BOS.  Pt's safety awareness appears to be intact as she notices objects and a wet floor sign on the floor, asking if it is okay for her to proceed with walking.  Pt will continue to benefit from skilled PT with focus on strength, balance and safe functional mobility.     Follow Up Recommendations Home health PT;Supervision - Intermittent    Equipment Recommendations  None recommended by PT    Recommendations for Other Services       Precautions / Restrictions Precautions Precautions: Fall Restrictions Weight Bearing Restrictions: No      Mobility  Bed Mobility Overal bed mobility: Modified Independent             General bed mobility comments: Increased time.  Transfers Overall transfer level: Modified independent Equipment used: Rolling walker (2 wheeled)             General transfer comment: Increased time and reliance on UE's to stand.  Ambulation/Gait Ambulation/Gait assistance: Supervision Gait Distance (Feet): 40 Feet Assistive  device: Rolling walker (2 wheeled)     Gait velocity interpretation: 1.31 - 2.62 ft/sec, indicative of limited community ambulator General Gait Details: Decreased step length and foot clearance.  Good use of RW and able to turn safely.  Stairs            Wheelchair Mobility    Modified Rankin (Stroke Patients Only)       Balance Overall balance assessment: Needs assistance Sitting-balance support: Feet supported Sitting balance-Leahy Scale: Normal     Standing balance support: Bilateral upper extremity supported Standing balance-Leahy Scale: Fair Standing balance comment: Able to stand without hands on RW but needed for functional tasks and dynamic gait.  Unilateral UE assist to lift object from floor and reach overhead.               High Level Balance Comments: Functional reach: 5 inches with unilateral UE.             Pertinent Vitals/Pain Pain Assessment: Faces Faces Pain Scale: Hurts a little bit Pain Location: Low back pain. Pain Descriptors / Indicators: Aching;Grimacing Pain Intervention(s): Limited activity within patient's tolerance;Monitored during session    Home Living Family/patient expects to be discharged to:: Private residence Living Arrangements: Alone Available Help at Discharge: Family;Available PRN/intermittently Type of Home: House Home Access: Level entry     Home Layout: One level Home Equipment: Walker - 2 wheels;Walker - 4 wheels;Cane - single point Additional  Comments: Pt ambulates household distances with rollator.    Prior Function Level of Independence: Independent with assistive device(s)         Comments: Uses rollator for household amb., has neighbors who check in regularly.  Has a little dog which she is able to care for.     Hand Dominance   Dominant Hand: Right    Extremity/Trunk Assessment   Upper Extremity Assessment Upper Extremity Assessment: Generalized weakness(Grossly 4-/5 bilaterally.  Hx of RC  tear which limits AROM.)    Lower Extremity Assessment Lower Extremity Assessment: Overall WFL for tasks assessed(Grossly 4/5 bilaterally with limited MMT in feet due to pain related to amputations and hx of trauma to heel.)    Cervical / Trunk Assessment Cervical / Trunk Assessment: Normal  Communication   Communication: No difficulties  Cognition Arousal/Alertness: Awake/alert Behavior During Therapy: WFL for tasks assessed/performed Overall Cognitive Status: Within Functional Limits for tasks assessed                                 General Comments: Talkative, requires redirection throughout.  Has multiple stories related to medical hx which pt seems to perseverate on.      General Comments      Exercises Other Exercises Other Exercises: Additional time needed for education/discussion about benefit of HH PT and encouraging pt to participate with PT today.  Pt talkative and requires redirection at times.  x8 min   Assessment/Plan    PT Assessment Patient needs continued PT services  PT Problem List Decreased strength;Decreased mobility;Decreased activity tolerance;Decreased balance;Decreased knowledge of use of DME       PT Treatment Interventions DME instruction;Therapeutic activities;Gait training;Therapeutic exercise;Patient/family education;Balance training;Functional mobility training    PT Goals (Current goals can be found in the Care Plan section)  Acute Rehab PT Goals Patient Stated Goal: To return home and care for her dog. PT Goal Formulation: With patient Time For Goal Achievement: 11/06/19 Potential to Achieve Goals: Good    Frequency Min 2X/week   Barriers to discharge        Co-evaluation               AM-PAC PT "6 Clicks" Mobility  Outcome Measure Help needed turning from your back to your side while in a flat bed without using bedrails?: None Help needed moving from lying on your back to sitting on the side of a flat bed  without using bedrails?: None Help needed moving to and from a bed to a chair (including a wheelchair)?: A Little Help needed standing up from a chair using your arms (e.g., wheelchair or bedside chair)?: A Little Help needed to walk in hospital room?: A Little Help needed climbing 3-5 steps with a railing? : A Little 6 Click Score: 20    End of Session Equipment Utilized During Treatment: Gait belt Activity Tolerance: Patient tolerated treatment well Patient left: in bed;with call bell/phone within reach;with bed alarm set Nurse Communication: Mobility status PT Visit Diagnosis: Unsteadiness on feet (R26.81);History of falling (Z91.81);Muscle weakness (generalized) (M62.81)    Time: 1100-1120 PT Time Calculation (min) (ACUTE ONLY): 20 min   Charges:   PT Evaluation $PT Eval Low Complexity: 1 Low PT Treatments $Therapeutic Activity: 8-22 mins       Glenetta Hew, PT, DPT   Glenetta Hew 10/23/2019, 12:04 PM

## 2019-10-23 NOTE — Progress Notes (Addendum)
PROGRESS NOTE    Jessica Donovan  JSH:[id number] DOB: [birth year] DOA: 10/20/2019 PCP: Robyn Haber, MD    Brief Narrative:  73 year old lady with past medical history significant for diabetes, hypertension and chronic back pain was admitted yesterday with altered mental status secondary to UTI.  Assessment & Plan:   Principal Problem:   UTI (urinary tract infection) Active Problems:   Physical debility   Polypharmacy   CAD (coronary artery disease)   Type 2 diabetes mellitus with diabetic neuropathy (HCC)   Acute encephalopathy   HTN (hypertension)   Pressure injury of skin  Sepsis secondary to UTI.    Patient initially met sepsis criteria most likely secondary to UTI.  Her mental status improved with management.  Urine culture grew E. coli with good sensitivity.  Patient was reluctant to go home today and requesting 1 more day as she lives alone and still wakes feel due to her infection. She will be switched to cefazolin today for 1 day and will be discharged home tomorrow on ciprofloxacin for 2 more days.  Acute metabolic encephalopathy.    Most likely multifactorial which includes UTI and drug overdose as her mental status improved with Narcan. Patient was evaluated by PT due to fall and they were recommending home health services for continuation of PT to increase endurance.  CAD.  No chest pain or shortness of breath. -Continue home meds.  Hypertension.  Continue home meds.  Diabetes.  Uncontrolled with A1c of 8.8. -Continue Lantus 50 units daily. -Continue SSI.   DVT prophylaxis: Lovenox Code Status: DNR Family Communication: Discussed with patient. Disposition Plan: Will be discharged tomorrow.  Consultants:   None  Antimicrobials:  Ceftriaxone-for 2 days Cefazolin-started today.  Subjective: Patient was feeling much better when seen this morning.  She is having mild lower abdominal discomfort.  Dysuria has been improved.  She is trying to keep  herself hydrated.  She was requesting to stay 1 more day as she lives alone and still feels very fatigued.  Objective: Vitals:   10/22/19 1145 10/22/19 1231 10/22/19 1652 10/22/19 1927  BP: (!) 110/58 (!) 98/53 120/66 (!) 117/57  Pulse: 94 96 96 93  Resp: _0 Temp:  98.6 F (37 C)  99.1 F (37.3 C)  TempSrc:  Oral  Oral  SpO2: 97% 93% 95% 95%  Weight:      Height:        Intake/Output Summary (Last 24 hours) at 10/23/2019 1359 Last data filed at 10/23/2019 0900 Gross per 24 hour  Intake 351 ml  Output -  Net 351 ml   Filed Weights   10/20/19 2111  Weight: 88.5 kg    Examination:  General exam: Appears calm and comfortable  Respiratory system: Clear to auscultation. Respiratory effort normal. Cardiovascular system: S1 & S2 heard, RRR. No JVD, murmurs, rubs, gallops or clicks. No pedal edema. Gastrointestinal system: Abdomen is nondistended, mild lower abdominal tenderness. No organomegaly. Normal bowel sounds heard. Central nervous system: Alert and oriented. No focal neurological deficits. Extremities: No edema or cyanosis.  Pulses intact and symmetrical. Skin: No rashes, lesions or ulcers Psychiatry: Judgement and insight appear normal. Mood & affect appropriate.    Data Reviewed: I have personally reviewed following labs and imaging studies  CBC: Recent Labs  Lab 10/20/19 2018 10/21/19 0609  WBC 15.1* 13.3*  NEUTROABS 11.1*  --   HGB 12.4 12.5  HCT 38.7 39.6  MCV 98.5 100.0  PLT 259 850   Basic Metabolic Panel:  Recent Labs  Lab 10/20/19 2018 10/21/19 0609 10/22/19 0507  NA 135 141 138  K 4.6 5.2* 4.3  CL 100 105 102  CO2 _0 GLUCOSE 216* 229* 124*  BUN 33* 22 10  CREATININE 1.14* 0.85 0.61  CALCIUM 9.9 9.6 8.5*   GFR: Estimated Creatinine Clearance: 72.9 mL/min (by C-G formula based on SCr of 0.61 mg/dL). Liver Function Tests: Recent Labs  Lab 10/20/19 2018  AST 31  ALT 16  ALKPHOS 71  BILITOT 0.6  PROT 6.3*  ALBUMIN 3.7    No results for input(s): LIPASE, AMYLASE in the last 168 hours. No results for input(s): AMMONIA in the last 168 hours. Coagulation Profile: Recent Labs  Lab 10/20/19 2018  INR 1.1   Cardiac Enzymes: Recent Labs  Lab 10/20/19 2018 10/22/19 0507  CKTOTAL 612* 102   BNP (last 3 results) No results for input(s): PROBNP in the last 8760 hours. HbA1C: Recent Labs    10/20/19 2018  HGBA1C 8.8*   CBG: Recent Labs  Lab 10/22/19 1250 10/22/19 1702 10/22/19 2021 10/23/19 0735 10/23/19 1132  GLUCAP 165* 227* 185* 115* 189*   Lipid Profile: No results for input(s): CHOL, HDL, LDLCALC, TRIG, CHOLHDL, LDLDIRECT in the last 72 hours. Thyroid Function Tests: No results for input(s): TSH, T4TOTAL, FREET4, T3FREE, THYROIDAB in the last 72 hours. Anemia Panel: No results for input(s): VITAMINB12, FOLATE, FERRITIN, TIBC, IRON, RETICCTPCT in the last 72 hours. Sepsis Labs: Recent Labs  Lab 10/21/19 0021  LATICACIDVEN 1.4    Recent Results (from the past 240 hour(s))  Urine culture     Status: Abnormal   Collection Time: 10/20/19 10:30 PM   Specimen: Urine, Random  Result Value Ref Range Status   Specimen Description   Final    URINE, RANDOM Performed at [facility], [address] [employer], [place] [address]    Special Requests   Final    NONE Performed at [facility], [address] [place],  [address]    Culture >=100,000 COLONIES/mL ESCHERICHIA COLI (A)  Final   Report Status 10/23/2019 FINAL  Final   Organism ID, Bacteria ESCHERICHIA COLI (A)  Final      Susceptibility   Escherichia coli - MIC*    AMPICILLIN 8 SENSITIVE Sensitive     CEFAZOLIN <=4 SENSITIVE Sensitive     CEFTRIAXONE <=1 SENSITIVE Sensitive     CIPROFLOXACIN <=0.25 SENSITIVE Sensitive     GENTAMICIN <=1 SENSITIVE Sensitive     IMIPENEM <=0.25 SENSITIVE Sensitive     NITROFURANTOIN 128 RESISTANT Resistant     TRIMETH/SULFA <=20 SENSITIVE Sensitive      AMPICILLIN/SULBACTAM <=2 SENSITIVE Sensitive     PIP/TAZO <=4 SENSITIVE Sensitive     Extended ESBL NEGATIVE Sensitive     * >=100,000 COLONIES/mL ESCHERICHIA COLI  SARS CORONAVIRUS 2 (TAT 6-24 HRS) Nasopharyngeal Nasopharyngeal Swab     Status: None   Collection Time: 10/21/19 12:21 AM   Specimen: Nasopharyngeal Swab  Result Value Ref Range Status   SARS Coronavirus 2 NEGATIVE NEGATIVE Final    Comment: (NOTE) SARS-CoV-2 target nucleic acids are NOT DETECTED. The SARS-CoV-2 RNA is generally detectable in upper and lower respiratory specimens during the acute phase of infection. Negative results do not preclude SARS-CoV-2 infection, do not rule out co-infections with other pathogens, and should not be used as the sole basis for treatment or other patient management decisions. Negative results must be combined with clinical observations, patient history, and epidemiological information. The  expected result is Negative. Fact Sheet for Patients: SugarRoll.be Fact Sheet for Healthcare Providers: https://www.woods-mathews.com/ This test is not yet approved or cleared by the [place] FDA and  has been authorized for detection and/or diagnosis of SARS-CoV-2 by FDA under an Emergency Use Authorization (EUA). This EUA will remain  in effect (meaning this test can be used) for the duration of the COVID-19 declaration under Section 56 4(b)(1) of the Act, 21 U.S.C. section 360bbb-3(b)(1), unless the authorization is terminated or revoked sooner. Performed at [facility], [place] [address] [place], [place] [address]      Radiology Studies: No results found.  Scheduled Meds: . ALPRAZolam  0.5 mg Oral TID  . aspirin EC  81 mg Oral Daily  . atorvastatin  20 mg Oral Daily  . enoxaparin (LOVENOX) injection  40 mg Subcutaneous Q24H  . insulin aspart  0-5 Units Subcutaneous QHS  . insulin aspart  0-9 Units Subcutaneous TID WC  . insulin  glargine  50 Units Subcutaneous Daily  . isosorbide mononitrate  30 mg Oral Daily  . lisinopril  20 mg Oral BID  . mirtazapine  15 mg Oral QHS  . morphine  15 mg Oral TID  . pantoprazole  40 mg Oral Daily  . phenazopyridine  100 mg Oral TID WC  . traZODone  100 mg Oral QHS   Continuous Infusions: . sodium chloride Stopped (10/23/19 0107)  .  ceFAZolin (ANCEF) IV       LOS: 2 days    Time spent: 45 minutes    Lorella Nimrod, MD [facility] Pager [phone]  If 7PM-7AM, please contact night-coverage [email or web address] Password [facility] 10/23/2019, 1:59 PM

## 2019-10-23 NOTE — Consult Note (Signed)
[facility] Nurse wound consult note Patient receiving care in [facility]. Reason for Consult: Left heel wound Wound type: chronic, healing trauma injury from a dog attack months ago. Measurement: 2 cm x 2.8 cm x no measurable depth Wound bed: 100% pink Drainage (amount, consistency, odor) serous on existing dressing Periwound: intact.  Patient has been being seen for the wound by Dr. Elvina Mattes, Podiatrist. Dressing procedure/placement/frequency: Cleanse wound on left posterior heel with saline. Pat dry. Place a small piece of Telfa pad over the area, secure with a foam heel dressing.  Change the Telfa daily. The foam can remain in place up to 3 days. Monitor the wound area(s) for worsening of condition such as: Signs/symptoms of infection,  Increase in size,  Development of or worsening of odor, Development of pain, or increased pain at the affected locations.  Notify the medical team if any of these develop.  Thank you for the consult.  Discussed plan of care with the patient.  [place] nurse will not follow at this time.  Please re-consult the [place] team if needed.  Val Riles, RN, MSN, CWOCN, CNS-BC, pager [phone]

## 2019-10-24 DIAGNOSIS — Z79899 Other long term (current) drug therapy: Secondary | ICD-10-CM

## 2019-10-24 LAB — GLUCOSE, CAPILLARY
Glucose-Capillary: 129 mg/dL — ABNORMAL HIGH (ref 70–99)
Glucose-Capillary: 212 mg/dL — ABNORMAL HIGH (ref 70–99)

## 2019-10-24 MED ORDER — CIPROFLOXACIN HCL 500 MG PO TABS: 500.0000 mg | ORAL_TABLET | Freq: Two times a day (BID) | ORAL | 0 refills | Status: AC

## 2019-10-24 MED ORDER — CIPROFLOXACIN HCL 500 MG PO TABS
500.0000 mg | ORAL_TABLET | Freq: Two times a day (BID) | ORAL | Status: DC
  Administered 2019-10-24: 08:00:00 500 mg via ORAL
  Filled 2019-10-24: qty 1

## 2019-10-24 NOTE — Progress Notes (Signed)
Patient discharged home per MD order. All discharge instructions given and all questions answered. 

## 2019-10-24 NOTE — TOC Initial Note (Signed)
Transition of Care [facility]) - Initial/Assessment Note    Patient Details  Name: Jessica Donovan MRN: [id number] Date of Birth: 07-24-1946  Transition of Care [facility]) CM/SW Contact:    Shelbie Hutching, RN Phone Number: 10/24/2019, 2:23 PM  Clinical Narrative:                 Patient admitted with urinary tract infection.  Patient is from home and lives alone in an apartment in [place].  Patient reports that she is able to perform her ADL's, she uses a rollator.  Patient reports that she cannot stand to cook but prepares quick meals that can be opened and microwaved or eaten cold.  Patient has a good friend that is a Education officer, museum, [employer]lyss [employer]ress,  that helps her get groceries.  Vicente Males will provide transportation home at discharge. RNCM offered to arrange home health but patient refuses at this time. Patient verbalizes understanding that if she wants home health in the future she can have it arranged through her PCP.     Expected Discharge Plan: Home/Self Care Barriers to Discharge: No Barriers Identified   Patient Goals and CMS Choice        Expected Discharge Plan and Services Expected Discharge Plan: Home/Self Care   Discharge Planning Services: CM Consult   Living arrangements for the past 2 months: Apartment Expected Discharge Date: 10/24/19                                    Prior Living Arrangements/Services Living arrangements for the past 2 months: Apartment Lives with:: Self Patient language and need for interpreter reviewed:: Yes Do you feel safe going back to the place where you live?: Yes      Need for Family Participation in Patient Care: Yes (Comment) Care giver support system in place?: Yes (comment)(good friend) Current home services: DME(rollator) Criminal Activity/Legal Involvement Pertinent to Current Situation/Hospitalization: No - Comment as needed  Activities of Daily Living Home Assistive Devices/Equipment: Eyeglasses, Environmental consultant (specify type)(4xwheels  walker, bsc) ADL Screening (condition at time of admission) Patient's cognitive ability adequate to safely complete daily activities?: Yes Is the patient deaf or have difficulty hearing?: Yes Does the patient have difficulty seeing, even when wearing glasses/contacts?: No Does the patient have difficulty concentrating, remembering, or making decisions?: Yes Patient able to express need for assistance with ADLs?: Yes Does the patient have difficulty dressing or bathing?: No Independently performs ADLs?: No Communication: Independent Dressing (OT): Independent Grooming: Independent Feeding: Independent Toileting: Needs assistance In/Out Bed: Needs assistance Is this a change from baseline?: Pre-admission baseline Walks in Home: Independent with device (comment)(4x wheels walker) Does the patient have difficulty walking or climbing stairs?: Yes Weakness of Legs: Both Weakness of Arms/Hands: None  Permission Sought/Granted Permission sought to share information with : Case Manager Permission granted to share information with : Yes, Verbal Permission Granted              Emotional Assessment Appearance:: Appears stated age Attitude/Demeanor/Rapport: Engaged Affect (typically observed): Accepting Orientation: : Oriented to Self, Oriented to Place, Oriented to  Time, Oriented to Situation Alcohol / Substance Use: Not Applicable Psych Involvement: No (comment)  Admission diagnosis:  Fall [W19.XXXA] Acute cystitis without hematuria [N30.00] Accidental drug overdose, initial encounter [T50.901A] Patient Active Problem List   Diagnosis Date Noted  . Pressure injury of skin 10/23/2019  . Fall   . HTN (hypertension) 10/20/2019  . Right foot  infection 12/19/2018  . Acute encephalopathy 07/06/2018  . Anxiety 06/10/2017  . Chest pain with moderate risk for cardiac etiology 06/09/2017  . UTI (urinary tract infection) 05/19/2017  . CAD (coronary artery disease) 05/19/2017  . Chronic  narcotic use 05/19/2017  . Cellulitis in diabetic foot (HCC) 05/19/2017  . Opioid overdose (HCC) 02/23/2017  . Altered mental status 02/23/2017  . Acute colitis 01/19/2017  . Sepsis (HCC) 01/17/2017  . Cellulitis of fourth toe of right foot 04/10/2016  . Somnolence 04/09/2016  . Dementia (HCC) 04/09/2016  . Cellulitis 04/03/2016  . Hematoma of right parietal scalp 02/23/2016  . Multiple falls 02/23/2016  . Physical debility 02/23/2016  . Type II diabetes mellitus, uncontrolled (HCC) 02/23/2016  . Syncope and collapse 02/23/2016  . Polypharmacy 02/23/2016  . Osteomyelitis of toe (HCC) 01/23/2016  . Rhabdomyolysis 12/07/2015  . ARF (acute renal failure) (HCC) 11/29/2015  . Hypotension 11/29/2015  . 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 Angina effort 11/07/2015  . Osteomyelitis (HCC) 11/07/2015  . Anemia due to chronic blood loss 06/14/2015  . Generalized anxiety disorder 06/14/2015  . Lumbago with sciatica 06/14/2015  . 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  . 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 Abnormal weight loss 05/18/2014  . Chronic pain syndrome 05/18/2014  . 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 Diabetic polyneuropathy (HCC) 05/18/2014  . Type 2 diabetes mellitus with diabetic neuropathy (HCC) 05/07/2014  . Hemorrhage of rectum and anus 01/17/2014  . Cellulitis and abscess of foot excluding toe 05/18/2012   PCP:  Garlon Hatchet, MD Pharmacy:   [facility] [phone] [place], [place] - [address] [place] [address] Phone: [phone] Fax: [phone]  [facility] - [place], [place] - [address] [place] [address] Phone: [phone] Fax: [phone]  [facility] - [place], [place] - [address]  [employer] [place] [address] Phone: [phone] Fax: [phone]     Social Determinants of Health (SDOH) Interventions    Readmission Risk Interventions No flowsheet data found.

## 2019-10-24 NOTE — Discharge Summary (Signed)
Physician Discharge Summary  Jessica Donovan WER:[id number] DOB: 09/09/1946 DOA: 10/20/2019  PCP: Robyn Haber, MD  Admit date: 10/20/2019 Discharge date: 10/24/2019  Admitted From: Home Disposition: Home Recommendations for Outpatient Follow-up:  1. Follow up with PCP in 1-2 weeks 2. Please obtain BMP/CBC in one week 3. Please follow up on the following pending results:  Home Health: Yes Equipment/Devices: None Discharge Condition: Stable CODE STATUS: DNR Diet recommendation: Heart Healthy / Carb Modified   Brief/Interim Summary: 73 year old lady with past medical history significant for diabetes, hypertension and chronic back pain was admitted with altered mental status secondary to UTI.  He was initially treated with ceftriaxone, urine culture grew pretty sensitive E. coli and she was switched to ciprofloxacin to complete a 5-day course.  Her chronic conditions which include CAD, hypertension and diabetes were managed on her home meds.  Discharge Diagnoses:  Principal Problem:   UTI (urinary tract infection) Active Problems:   Physical debility   Polypharmacy   CAD (coronary artery disease)   Type 2 diabetes mellitus with diabetic neuropathy (HCC)   Acute encephalopathy   HTN (hypertension)   Pressure injury of skin   Fall  Discharge Instructions  Discharge Instructions    Diet - low sodium heart healthy   Complete by: As directed    Discharge instructions   Complete by: As directed    It was pleasure taking care of you. I am giving you ciprofloxacin to be taken for 2 days, please finish all the pills. Please try keeping yourself well-hydrated. We are also arranging some home health physical therapy for you that will increase your strength and prevent further falls. Try to take your pain medicine as little as possible as that can also cause frequent falls.   Increase activity slowly   Complete by: As directed      Allergies as of 10/24/2019      Reactions    Ampicillin Nausea And Vomiting, Other (See Comments)   Did it involve swelling of the face/tongue/throat, SOB, or low BP? Yes Did it involve sudden or severe rash/hives, skin peeling, or any reaction on the inside of your mouth or nose? No Did you need to seek medical attention at a hospital or doctor's office? No When did it last happen?>10 years If all above answers are "NO", may proceed with cephalosporin use.   Bactrim [sulfamethoxazole-trimethoprim] Other (See Comments)   Reaction: unknown   Codeine Nausea And Vomiting   Eggs Or Egg-derived Products    Loss of consciousness per pt   Flu Virus Vaccine Other (See Comments)   Reaction: Passed out for 12 days   Influenza Vaccines Other (See Comments)   Reaction:  Caused pt to pass out    Methadone Hives, Itching   Oxycodone-acetaminophen Nausea And Vomiting   Percocet [oxycodone-acetaminophen] Nausea And Vomiting   Sulfa Antibiotics Other (See Comments)   Reaction: unknown   Tetanus Antitoxin Swelling, Other (See Comments)   Reaction: injection site swelling   Tetanus Toxoids Swelling, Other (See Comments)   Reaction:  Swelling at injection site   Penicillin G Rash   Has patient had a PCN reaction causing immediate rash, facial/tongue/throat swelling, SOB or lightheadedness with hypotension: Yes Has patient had a PCN reaction causing severe rash involving mucus membranes or skin necrosis: No Has patient had a PCN reaction that required hospitalization: No Has patient had a PCN reaction occurring within the last 10 years: No If all of the above answers are "NO", then may proceed with Cephalosporin  use..   Tetracyclines & Related Rash      Medication List    STOP taking these medications   cyclobenzaprine 10 MG tablet Commonly known as: FLEXERIL   nitrofurantoin 50 MG capsule Commonly known as: MACRODANTIN     TAKE these medications   acetaminophen 500 MG tablet Commonly known as: TYLENOL Take 1,000 mg by mouth every 8  (eight) hours as needed for mild pain or headache.   ALPRAZolam 1 MG tablet Commonly known as: XANAX Take 1 tablet (1 mg total) by mouth 3 (three) times daily.   aspirin 81 MG tablet Take 81 mg by mouth daily.   atorvastatin 20 MG tablet Commonly known as: LIPITOR Take 20 mg by mouth daily.   ciprofloxacin 500 MG tablet Commonly known as: CIPRO Take 1 tablet (500 mg total) by mouth 2 (two) times daily for 2 days.   diphenoxylate-atropine 2.5-0.025 MG tablet Commonly known as: LOMOTIL Take 1 tablet by mouth every 4 (four) hours as needed for diarrhea or loose stools.   ferrous sulfate 325 (65 FE) MG tablet Take 325 mg by mouth daily with breakfast.   insulin aspart 100 UNIT/ML injection Commonly known as: novoLOG Inject 3-15 Units into the skin 3 (three) times daily with meals as needed for high blood sugar. Pt uses as needed per sliding scale:    Less than 140:  0 units  140-180:  3 units 181-220:  4 units 221- 260:  6 units 261- 320:  8 units 321-360:  10 units 361-400:  12 units Greater than 400:  15 units   isosorbide mononitrate 30 MG 24 hr tablet Commonly known as: IMDUR Take 30 mg by mouth daily.   lisinopril 20 MG tablet Commonly known as: ZESTRIL Take 20 mg by mouth 2 (two) times daily.   metoprolol tartrate 25 MG tablet Commonly known as: LOPRESSOR Take 25 mg by mouth 2 (two) times daily.   mirtazapine 15 MG tablet Commonly known as: REMERON Take 15 mg by mouth at bedtime.   morphine 15 MG tablet Commonly known as: MSIR Take 1 tablet (15 mg total) by mouth 3 (three) times daily. What changed:   when to take this  reasons to take this   omeprazole 20 MG capsule Commonly known as: PRILOSEC Take 20 mg by mouth daily.   Toujeo SoloStar 300 UNIT/ML Sopn Generic drug: Insulin Glargine (1 Unit Dial) Inject 50 Units into the skin daily.   traZODone 100 MG tablet Commonly known as: DESYREL Take 100 mg by mouth at bedtime.   venlafaxine XR 75  MG 24 hr capsule Commonly known as: EFFEXOR-XR Take 75 mg by mouth 3 (three) times daily.   Vitamin D (Ergocalciferol) 1.25 MG (50000 UT) Caps capsule Commonly known as: DRISDOL Take 50,000 Units by mouth every Sunday.       Allergies  Allergen Reactions  . Ampicillin Nausea And Vomiting and Other (See Comments)    Did it involve swelling of the face/tongue/throat, SOB, or low BP? Yes Did it involve sudden or severe rash/hives, skin peeling, or any reaction on the inside of your mouth or nose? No Did you need to seek medical attention at a hospital or doctor's office? No When did it last happen?>10 years If all above answers are "NO", may proceed with cephalosporin use.   . Bactrim [Sulfamethoxazole-Trimethoprim] Other (See Comments)    Reaction: unknown  . Codeine Nausea And Vomiting  . Eggs Or Egg-Derived Products     Loss of consciousness per  pt  . Flu Virus Vaccine Other (See Comments)    Reaction: Passed out for 12 days  . Influenza Vaccines Other (See Comments)    Reaction:  Caused pt to pass out   . Methadone Hives and Itching  . Oxycodone-Acetaminophen Nausea And Vomiting  . Percocet [Oxycodone-Acetaminophen] Nausea And Vomiting  . Sulfa Antibiotics Other (See Comments)    Reaction: unknown  . Tetanus Antitoxin Swelling and Other (See Comments)    Reaction: injection site swelling  . Tetanus Toxoids Swelling and Other (See Comments)    Reaction:  Swelling at injection site  . Penicillin G Rash    Has patient had a PCN reaction causing immediate rash, facial/tongue/throat swelling, SOB or lightheadedness with hypotension: Yes Has patient had a PCN reaction causing severe rash involving mucus membranes or skin necrosis: No Has patient had a PCN reaction that required hospitalization: No Has patient had a PCN reaction occurring within the last 10 years: No If all of the above answers are "NO", then may proceed with Cephalosporin use..  . Tetracyclines & Related Rash     Consultations: None  Procedures/Studies: Ct Head Wo Contrast  Result Date: 10/20/2019 CLINICAL DATA:  73 year old female with head trauma. EXAM: CT HEAD WITHOUT CONTRAST CT CERVICAL SPINE WITHOUT CONTRAST TECHNIQUE: Multidetector CT imaging of the head and cervical spine was performed following the standard protocol without intravenous contrast. Multiplanar CT image reconstructions of the cervical spine were also generated. COMPARISON:  Head CT dated 05/10/2019 FINDINGS: CT HEAD FINDINGS Brain: Mild age-related atrophy and chronic microvascular ischemic changes. There is no acute intracranial hemorrhage. No mass effect or midline shift. No extra-axial fluid collection. Vascular: No hyperdense vessel or unexpected calcification. Skull: Normal. Negative for fracture or focal lesion. Sinuses/Orbits: No acute finding. Other: None CT CERVICAL SPINE FINDINGS Alignment: No acute subluxation Skull base and vertebrae: No acute fracture. Osteopenia. Soft tissues and spinal canal: No prevertebral fluid or swelling. No visible canal hematoma. Disc levels:  No acute findings. Mild degenerative changes. Upper chest: Biapical interstitial streaky densities may represent atelectasis or edema. Other: Bilateral carotid bulb calcified plaques. IMPRESSION: 1. No acute intracranial hemorrhage. Mild age-related atrophy and chronic microvascular ischemic changes. 2. No acute/traumatic cervical spine pathology. Electronically Signed   By: Elgie Collard M.D.   On: 10/20/2019 21:58   Ct Chest W Contrast  Result Date: 10/20/2019 CLINICAL DATA:  Abdominal trauma, blunt, found down, headache and abdominal pain EXAM: CT CHEST, ABDOMEN, AND PELVIS WITH CONTRAST TECHNIQUE: Multidetector CT imaging of the chest, abdomen and pelvis was performed following the standard protocol during bolus administration of intravenous contrast. CONTRAST:  OMNIPAQUE IOHEXOL 300 MG/ML  SOLN COMPARISON:  CTA chest May 19, 2017, CT abdomen  pelvis June 22, 2017 FINDINGS: CT CHEST FINDINGS Cardiovascular: The aorta is normal caliber. No acute luminal abnormality of the aorta is seen. No periaortic stranding or hemorrhage. Atherosclerotic calcifications are present throughout the normal caliber aorta. Normal 3 vessel branching of the aortic arch. Minimal plaque within the proximal great vessels. Normal heart size. Trace pericardial fluid is likely within physiologic normal. Atherosclerotic calcification of the coronary arteries. Central pulmonary arteries are normal caliber. No large central filling defects on this non tailored examination. Mediastinum/Nodes: No mediastinal hematoma or pneumomediastinum. No acute traumatic abnormality of the trachea or esophagus. Thyroid gland and thoracic inlet are unremarkable. No mediastinal, hilar or axillary adenopathy. Lungs/Pleura: Bandlike areas of scarring and/or atelectasis are present in the lung bases and apices. Dependent atelectasis posteriorly. No consolidation, features  of edema, pneumothorax, or effusion. No suspicious pulmonary nodules or masses. Musculoskeletal: No acute osseous injury or suspicious osseous lesion within the chest. Multilevel degenerative changes are present in the thoracic spine. Moderate degenerative changes noted in both shoulders features are not significantly changed from comparison studies. No abnormal chest wall hematoma or traumatic soft tissue injury. CT ABDOMEN PELVIS FINDINGS Hepatobiliary: No hepatic injury or perihepatic hematoma. No focal liver abnormality is seen. Patient is post cholecystectomy. Slight prominence of the biliary tree likely related to reservoir effect. No calcified intraductal gallstones. Pancreas: Partial fatty replacement of the pancreas particularly at the level of the pancreatic head. No pancreatic ductal dilatation or convincing peripancreatic inflammation Spleen: No splenic injury or perisplenic hematoma. No suspicious splenic lesions.  Adrenals/Urinary Tract: No adrenal hemorrhage or suspicious adrenal lesions. No direct renal injury or perirenal hemorrhage. No extravasation of contrast is seen on excretory phase delayed imaging. Kidneys are otherwise unremarkable, without renal calculi, suspicious lesion, or hydronephrosis. No evidence of bladder injury. Mild circumferential thickening of the bladder wall with faint perivesicular haze. Stomach/Bowel: Distal esophagus stomach and duodenal sweep are unremarkable. No small bowel dilatation or wall thickening. A normal appendix is visualized. No colonic dilatation or wall thickening. Slight redundancy of the sigmoid. No mesenteric hematoma or mesenteric contusion. Vascular/Lymphatic: Atherosclerotic plaque within the normal caliber aorta. No traumatic vascular injury in the abdomen or pelvis. No evidence of active contrast extravasation. Reproductive: Uterus is surgically absent. No concerning adnexal lesions. Other: Postsurgical changes of the right upper quadrant abdominal wall. Small fat containing umbilical hernia and bilateral inguinal hernias. No bowel containing hernias. Musculoskeletal: No acute traumatic osseous injury in the abdomen or pelvis. Remote right L1, L2 and L3 transverse process fractures are noted. Moderate to severe multilevel discogenic and facet degenerative changes throughout the lumbar spine with straightening of the normal lumbar lordosis and interspinous arthrosis compatible with Baastrup's disease dedicated spine reconstructions were performed. IMPRESSION: 1. No evidence of acute traumatic injury within the chest, abdomen, or pelvis. 2. Mild circumferential thickening of the bladder wall with faint perivesicular haze, suggestive of cystitis. Correlate with urinalysis. 3. Patient appears to be post hysterectomy and cholecystectomy, with surgical material in the right upper quadrant abdominal wall correlate with surgical history. 4. Coronary atherosclerosis. 5.  Thoracolumbar spinal reconstruction imaging was performed, please see dedicated report 6. Aortic Atherosclerosis (ICD10-I70.0). Electronically Signed   By: Kreg Shropshire M.D.   On: 10/20/2019 22:09   Ct Cervical Spine Wo Contrast  Result Date: 10/20/2019 CLINICAL DATA:  73 year old female with head trauma. EXAM: CT HEAD WITHOUT CONTRAST CT CERVICAL SPINE WITHOUT CONTRAST TECHNIQUE: Multidetector CT imaging of the head and cervical spine was performed following the standard protocol without intravenous contrast. Multiplanar CT image reconstructions of the cervical spine were also generated. COMPARISON:  Head CT dated 05/10/2019 FINDINGS: CT HEAD FINDINGS Brain: Mild age-related atrophy and chronic microvascular ischemic changes. There is no acute intracranial hemorrhage. No mass effect or midline shift. No extra-axial fluid collection. Vascular: No hyperdense vessel or unexpected calcification. Skull: Normal. Negative for fracture or focal lesion. Sinuses/Orbits: No acute finding. Other: None CT CERVICAL SPINE FINDINGS Alignment: No acute subluxation Skull base and vertebrae: No acute fracture. Osteopenia. Soft tissues and spinal canal: No prevertebral fluid or swelling. No visible canal hematoma. Disc levels:  No acute findings. Mild degenerative changes. Upper chest: Biapical interstitial streaky densities may represent atelectasis or edema. Other: Bilateral carotid bulb calcified plaques. IMPRESSION: 1. No acute intracranial hemorrhage. Mild age-related atrophy and chronic microvascular  ischemic changes. 2. No acute/traumatic cervical spine pathology. Electronically Signed   By: Elgie Collard M.D.   On: 10/20/2019 21:58   Ct Abdomen Pelvis W Contrast  Result Date: 10/20/2019 CLINICAL DATA:  Abdominal trauma, blunt, found down, headache and abdominal pain EXAM: CT CHEST, ABDOMEN, AND PELVIS WITH CONTRAST TECHNIQUE: Multidetector CT imaging of the chest, abdomen and pelvis was performed following the  standard protocol during bolus administration of intravenous contrast. CONTRAST:  OMNIPAQUE IOHEXOL 300 MG/ML  SOLN COMPARISON:  CTA chest May 19, 2017, CT abdomen pelvis June 22, 2017 FINDINGS: CT CHEST FINDINGS Cardiovascular: The aorta is normal caliber. No acute luminal abnormality of the aorta is seen. No periaortic stranding or hemorrhage. Atherosclerotic calcifications are present throughout the normal caliber aorta. Normal 3 vessel branching of the aortic arch. Minimal plaque within the proximal great vessels. Normal heart size. Trace pericardial fluid is likely within physiologic normal. Atherosclerotic calcification of the coronary arteries. Central pulmonary arteries are normal caliber. No large central filling defects on this non tailored examination. Mediastinum/Nodes: No mediastinal hematoma or pneumomediastinum. No acute traumatic abnormality of the trachea or esophagus. Thyroid gland and thoracic inlet are unremarkable. No mediastinal, hilar or axillary adenopathy. Lungs/Pleura: Bandlike areas of scarring and/or atelectasis are present in the lung bases and apices. Dependent atelectasis posteriorly. No consolidation, features of edema, pneumothorax, or effusion. No suspicious pulmonary nodules or masses. Musculoskeletal: No acute osseous injury or suspicious osseous lesion within the chest. Multilevel degenerative changes are present in the thoracic spine. Moderate degenerative changes noted in both shoulders features are not significantly changed from comparison studies. No abnormal chest wall hematoma or traumatic soft tissue injury. CT ABDOMEN PELVIS FINDINGS Hepatobiliary: No hepatic injury or perihepatic hematoma. No focal liver abnormality is seen. Patient is post cholecystectomy. Slight prominence of the biliary tree likely related to reservoir effect. No calcified intraductal gallstones. Pancreas: Partial fatty replacement of the pancreas particularly at the level of the pancreatic head.  No pancreatic ductal dilatation or convincing peripancreatic inflammation Spleen: No splenic injury or perisplenic hematoma. No suspicious splenic lesions. Adrenals/Urinary Tract: No adrenal hemorrhage or suspicious adrenal lesions. No direct renal injury or perirenal hemorrhage. No extravasation of contrast is seen on excretory phase delayed imaging. Kidneys are otherwise unremarkable, without renal calculi, suspicious lesion, or hydronephrosis. No evidence of bladder injury. Mild circumferential thickening of the bladder wall with faint perivesicular haze. Stomach/Bowel: Distal esophagus stomach and duodenal sweep are unremarkable. No small bowel dilatation or wall thickening. A normal appendix is visualized. No colonic dilatation or wall thickening. Slight redundancy of the sigmoid. No mesenteric hematoma or mesenteric contusion. Vascular/Lymphatic: Atherosclerotic plaque within the normal caliber aorta. No traumatic vascular injury in the abdomen or pelvis. No evidence of active contrast extravasation. Reproductive: Uterus is surgically absent. No concerning adnexal lesions. Other: Postsurgical changes of the right upper quadrant abdominal wall. Small fat containing umbilical hernia and bilateral inguinal hernias. No bowel containing hernias. Musculoskeletal: No acute traumatic osseous injury in the abdomen or pelvis. Remote right L1, L2 and L3 transverse process fractures are noted. Moderate to severe multilevel discogenic and facet degenerative changes throughout the lumbar spine with straightening of the normal lumbar lordosis and interspinous arthrosis compatible with Baastrup's disease dedicated spine reconstructions were performed. IMPRESSION: 1. No evidence of acute traumatic injury within the chest, abdomen, or pelvis. 2. Mild circumferential thickening of the bladder wall with faint perivesicular haze, suggestive of cystitis. Correlate with urinalysis. 3. Patient appears to be post hysterectomy and  cholecystectomy, with  surgical material in the right upper quadrant abdominal wall correlate with surgical history. 4. Coronary atherosclerosis. 5. Thoracolumbar spinal reconstruction imaging was performed, please see dedicated report 6. Aortic Atherosclerosis (ICD10-I70.0). Electronically Signed   By: Kreg Shropshire M.D.   On: 10/20/2019 22:09   Ct T-spine No Charge  Result Date: 10/20/2019 CLINICAL DATA:  Found down EXAM: CT THORACIC AND LUMBAR SPINE WITHOUT CONTRAST TECHNIQUE: Multiplanar CT images of the thoracic and lumbar spine were reconstructed from contemporary CT of the Chest, Abdomen and Pelvis. COMPARISON:  Lumbar MRI August 11, 2018, CT chest May 20, 2017, CT abdomen pelvis June 22, 2017 FINDINGS: CT THORACIC SPINE FINDINGS Alignment: Preservation of the normal thoracic kyphosis. Mild dextrocurvature. No traumatic listhesis. Posterior elements remain normally aligned. Vertebrae: Diffuse demineralization of the bones which may limit detection of subtle, nondisplaced fractures. Corticated triangular fragment at the anterior superior corner of T9 may reflect disc calcification versus a limbus vertebrae. No acute fracture or vertebral body height loss. No suspicious osseous lesions. Paraspinal and other soft tissues: No paravertebral fluid or swelling. No visible canal hematoma. For findings within the chest, please see dedicated CT report. Disc levels: Multilevel discogenic and facet degenerative changes are present throughout the thoracic spine. Features are most pronounced at T10-T11 where a slightly left central posterior disc osteophyte complex effaces the ventral thecal sac and results in mild-to-moderate canal stenosis. No significant neural foraminal narrowing is seen in the thoracic spine. CT LUMBAR SPINE FINDINGS Segmentation: 5 lumbar type vertebrae. Alignment: Straightening of the normal lumbar lordosis with slight degenerative reversal seen at L2-3. Stable grade 1 anterolisthesis L2-L3. No  acute traumatic listhesis. Mild dextrocurvature of the lumbar spine, apex at L3-L4. Vertebrae: The bones appear diffusely demineralized. Such finding may limit detection of subtle, nondisplaced fractures. There are multilevel Schmorl's node formations and sclerotic endplate changes on a discogenic basis. Hypertrophic facet changes are noted as well most pronounced at L4-5. Stable postsurgical changes from prior L5-S1 laminectomy. Additional degenerative changes and sclerosis are noted in both SI joints. No disruption of the sacral trabecula. Remote posttraumatic deformity of the right L1 through L3 transverse processes. Paraspinal and other soft tissues: No visible canal hematoma. No acute paraspinal soft tissue swelling or fluid. Disc levels: Level by level evaluation of the lumbar spine below: T12-L1: Disc height loss and vacuum phenomenon. Small calcified global disc bulge with vacuum phenomena. No significant spinal canal or foraminal stenosis. L1-L2: Disc height loss, small global disc bulge with vacuum phenomenon. Facet arthropathy with redundancy of the ligamentum flavum. Mild canal stenosis. No significant foraminal narrowing. L2-L3: Grade 1 anterolisthesis, disc height loss, small global disc bulge and vacuum phenomenon with posterior facet hypertrophy. Moderate canal stenosis, mild bilateral foraminal narrowing L3-L4: Severe disc height loss, global disc bulge with vacuum phenomenon and sclerotic endplate changes. Mild canal stenosis. Moderate bilateral foraminal narrowing. L4-L5: Disc height loss and vacuum phenomenon with a global disc bulge with central extrusion better seen on comparison MR. Moderate facet hypertrophic changes. Mild canal stenosis with mild bilateral foraminal narrowing. L5-S1: Left laminectomy at L5 and S1. Near complete disc height loss with vacuum phenomenon and calcified posterior spurring and hypertrophic facet changes. Findings result in mild canal stenosis and moderate bilateral  foraminal narrowing. IMPRESSION: *Diffuse bony demineralization may limit detection of subtle, nondisplaced fractures. *For acute findings in the chest, abdomen and pelvis, please see dedicated CT report. CT THORACIC SPINE IMPRESSION 1. No acute fracture or traumatic listhesis. 2. Multilevel discogenic and facet degenerative changes throughout the  thoracic spine most pronounced at T10-11 with moderate canal stenosis. 3. Limbus vertebrae T9 CT LUMBAR SPINE IMPRESSION 1. No acute fracture or traumatic listhesis. 2. Remote fracture deformity of the right L1-L3 transverse processes. 3. Stable grade 1 anterolisthesis L2 on L3 with focal reversal of lumbar lordosis. 4. Multilevel discogenic and facet degenerative changes through the lumbar spine as detailed level by level above. Findings maximal at L2-3 with moderate canal stenosis. 5. Prior hemilaminectomies at L5-S1. Electronically Signed   By: Kreg Shropshire M.D.   On: 10/20/2019 22:28   Ct L-spine No Charge  Result Date: 10/20/2019 CLINICAL DATA:  Found down EXAM: CT THORACIC AND LUMBAR SPINE WITHOUT CONTRAST TECHNIQUE: Multiplanar CT images of the thoracic and lumbar spine were reconstructed from contemporary CT of the Chest, Abdomen and Pelvis. COMPARISON:  Lumbar MRI August 11, 2018, CT chest May 20, 2017, CT abdomen pelvis June 22, 2017 FINDINGS: CT THORACIC SPINE FINDINGS Alignment: Preservation of the normal thoracic kyphosis. Mild dextrocurvature. No traumatic listhesis. Posterior elements remain normally aligned. Vertebrae: Diffuse demineralization of the bones which may limit detection of subtle, nondisplaced fractures. Corticated triangular fragment at the anterior superior corner of T9 may reflect disc calcification versus a limbus vertebrae. No acute fracture or vertebral body height loss. No suspicious osseous lesions. Paraspinal and other soft tissues: No paravertebral fluid or swelling. No visible canal hematoma. For findings within the chest, please  see dedicated CT report. Disc levels: Multilevel discogenic and facet degenerative changes are present throughout the thoracic spine. Features are most pronounced at T10-T11 where a slightly left central posterior disc osteophyte complex effaces the ventral thecal sac and results in mild-to-moderate canal stenosis. No significant neural foraminal narrowing is seen in the thoracic spine. CT LUMBAR SPINE FINDINGS Segmentation: 5 lumbar type vertebrae. Alignment: Straightening of the normal lumbar lordosis with slight degenerative reversal seen at L2-3. Stable grade 1 anterolisthesis L2-L3. No acute traumatic listhesis. Mild dextrocurvature of the lumbar spine, apex at L3-L4. Vertebrae: The bones appear diffusely demineralized. Such finding may limit detection of subtle, nondisplaced fractures. There are multilevel Schmorl's node formations and sclerotic endplate changes on a discogenic basis. Hypertrophic facet changes are noted as well most pronounced at L4-5. Stable postsurgical changes from prior L5-S1 laminectomy. Additional degenerative changes and sclerosis are noted in both SI joints. No disruption of the sacral trabecula. Remote posttraumatic deformity of the right L1 through L3 transverse processes. Paraspinal and other soft tissues: No visible canal hematoma. No acute paraspinal soft tissue swelling or fluid. Disc levels: Level by level evaluation of the lumbar spine below: T12-L1: Disc height loss and vacuum phenomenon. Small calcified global disc bulge with vacuum phenomena. No significant spinal canal or foraminal stenosis. L1-L2: Disc height loss, small global disc bulge with vacuum phenomenon. Facet arthropathy with redundancy of the ligamentum flavum. Mild canal stenosis. No significant foraminal narrowing. L2-L3: Grade 1 anterolisthesis, disc height loss, small global disc bulge and vacuum phenomenon with posterior facet hypertrophy. Moderate canal stenosis, mild bilateral foraminal narrowing L3-L4:  Severe disc height loss, global disc bulge with vacuum phenomenon and sclerotic endplate changes. Mild canal stenosis. Moderate bilateral foraminal narrowing. L4-L5: Disc height loss and vacuum phenomenon with a global disc bulge with central extrusion better seen on comparison MR. Moderate facet hypertrophic changes. Mild canal stenosis with mild bilateral foraminal narrowing. L5-S1: Left laminectomy at L5 and S1. Near complete disc height loss with vacuum phenomenon and calcified posterior spurring and hypertrophic facet changes. Findings result in mild canal stenosis and moderate  bilateral foraminal narrowing. IMPRESSION: *Diffuse bony demineralization may limit detection of subtle, nondisplaced fractures. *For acute findings in the chest, abdomen and pelvis, please see dedicated CT report. CT THORACIC SPINE IMPRESSION 1. No acute fracture or traumatic listhesis. 2. 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 3. Limbus vertebrae T9 CT LUMBAR SPINE IMPRESSION 1. No acute fracture or traumatic listhesis. 2. Remote fracture deformity of the right L1-L3 transverse processes. 3. Stable grade 1 anterolisthesis L2 on L3 with focal reversal of lumbar lordosis. 4. Multilevel discogenic and facet degenerative changes through the lumbar spine as detailed level by level above. Findings maximal at L2-3 with moderate canal stenosis. 5. Prior hemilaminectomies at L5-S1. Electronically Signed   By: Kreg ShropshirePrice  DeHay M.D.   On: 10/20/2019 22:28    Subjective: Patient is feeling better on the day of discharge.  Her abdominal pain has been improved.  Continues to have mild dysuria.  Discharge Exam: Vitals:   10/24/19 0403 10/24/19 1257  BP: 117/63 135/66  Pulse: 95 99  Resp: 17 14  Temp: 98.2 F (36.8 C) 98.5 F (36.9 C)  SpO2: 97% 96%   Vitals:   10/23/19 1501 10/23/19 1930 10/24/19 0403 10/24/19 1257  BP: 116/61 122/74  117/63 135/66  Pulse: 93 94 95 99  Resp: 14 18 17 14   Temp: 98.8 F (37.1 C) 98.3 F (36.8 C) 98.2 F (36.8 C) 98.5 F (36.9 C)  TempSrc: Oral   Oral  SpO2: 91% 98% 97% 96%  Weight:      Height:        General: Pt is alert, awake, not in acute distress Cardiovascular: RRR, S1/S2 +, no rubs, no gallops Respiratory: CTA bilaterally, no wheezing, no rhonchi Abdominal: Soft, NT, ND, bowel sounds + Extremities: no edema, no cyanosis  The results of significant diagnostics from this hospitalization (including imaging, microbiology, ancillary and laboratory) are listed below for reference.     Microbiology: Recent Results (from the past 240 hour(s))  Urine culture     Status: Abnormal   Collection Time: 10/20/19 10:30 PM   Specimen: Urine, Random  Result Value Ref Range Status   Specimen Description   Final    URINE, RANDOM Performed at [facility], [address] [place], [place] [address]    Special Requests   Final    NONE Performed at [facility], [address] [place], [place] [address]    Culture >=100,000 COLONIES/mL ESCHERICHIA COLI (A)  Final   Report Status 10/23/2019 FINAL  Final   Organism ID, Bacteria ESCHERICHIA COLI (A)  Final      Susceptibility   Escherichia coli - MIC*    AMPICILLIN 8 SENSITIVE Sensitive     CEFAZOLIN <=4 SENSITIVE Sensitive     CEFTRIAXONE <=1 SENSITIVE Sensitive     CIPROFLOXACIN <=0.25 SENSITIVE Sensitive     GENTAMICIN <=1 SENSITIVE Sensitive     IMIPENEM <=0.25 SENSITIVE Sensitive     NITROFURANTOIN 128 RESISTANT Resistant     TRIMETH/SULFA <=20 SENSITIVE Sensitive     AMPICILLIN/SULBACTAM <=2 SENSITIVE Sensitive     PIP/TAZO <=4 SENSITIVE Sensitive     Extended ESBL NEGATIVE Sensitive     * >=100,000 COLONIES/mL ESCHERICHIA COLI  SARS CORONAVIRUS 2 (TAT 6-24 HRS) Nasopharyngeal Nasopharyngeal Swab     Status: None   Collection Time: 10/21/19 12:21 AM   Specimen: Nasopharyngeal Swab  Result Value Ref  Range Status   SARS Coronavirus 2  NEGATIVE NEGATIVE Final    Comment: (NOTE) SARS-CoV-2 target nucleic acids are NOT DETECTED. The SARS-CoV-2 RNA is generally detectable in upper and lower respiratory specimens during the acute phase of infection. Negative results do not preclude SARS-CoV-2 infection, do not rule out co-infections with other pathogens, and should not be used as the sole basis for treatment or other patient management decisions. Negative results must be combined with clinical observations, patient history, and epidemiological information. The expected result is Negative. Fact Sheet for Patients: HairSlick.no Fact Sheet for Healthcare Providers: quierodirigir.com This test is not yet approved or cleared by the [place] FDA and  has been authorized for detection and/or diagnosis of SARS-CoV-2 by FDA under an Emergency Use Authorization (EUA). This EUA will remain  in effect (meaning this test can be used) for the duration of the COVID-19 declaration under Section 56 4(b)(1) of the Act, 21 U.S.C. section 360bbb-3(b)(1), unless the authorization is terminated or revoked sooner. Performed at [facility], [address] [place], [place] [address]      Labs: BNP (last 3 results) No results for input(s): BNP in the last 8760 hours. Basic Metabolic Panel: Recent Labs  Lab 10/20/19 2018 10/21/19 0609 10/22/19 0507  NA 135 141 138  K 4.6 5.2* 4.3  CL 100 105 102  CO2 GLUCOSE 216* 229* 124*  BUN 33* 22 10  CREATININE 1.14* 0.85 0.61  CALCIUM 9.9 9.6 8.5*   Liver Function Tests: Recent Labs  Lab 10/20/19 2018  AST 31  ALT 16  ALKPHOS 71  BILITOT 0.6  PROT 6.3*  ALBUMIN 3.7   No results for input(s): LIPASE, AMYLASE in the last 168 hours. No results for input(s): AMMONIA in the last 168 hours. CBC: Recent Labs  Lab 10/20/19 2018 10/21/19 0609  WBC 15.1* 13.3*  NEUTROABS  11.1*  --   HGB 12.4 12.5  HCT 38.7 39.6  MCV 98.5 100.0  PLT 259 234   Cardiac Enzymes: Recent Labs  Lab 10/20/19 2018 10/22/19 0507  CKTOTAL 612* 102   BNP: Invalid input(s): POCBNP CBG: Recent Labs  Lab 10/23/19 1132 10/23/19 1653 10/23/19 2037 10/24/19 0735 10/24/19 1142  GLUCAP 189* 178* 205* 129* 212*   D-Dimer No results for input(s): DDIMER in the last 72 hours. Hgb A1c No results for input(s): HGBA1C in the last 72 hours. Lipid Profile No results for input(s): CHOL, HDL, LDLCALC, TRIG, CHOLHDL, LDLDIRECT in the last 72 hours. Thyroid function studies No results for input(s): TSH, T4TOTAL, T3FREE, THYROIDAB in the last 72 hours.  Invalid input(s): FREET3 Anemia work up No results for input(s): VITAMINB12, FOLATE, FERRITIN, TIBC, IRON, RETICCTPCT in the last 72 hours. Urinalysis    Component Value Date/Time   COLORURINE YELLOW (A) 10/20/2019 2230   APPEARANCEUR CLOUDY (A) 10/20/2019 2230   APPEARANCEUR Cloudy (A) 11/29/2018 1518   LABSPEC 1.021 10/20/2019 2230   LABSPEC 1.014 12/15/2014 0958   PHURINE 5.0 10/20/2019 2230   GLUCOSEU 50 (A) 10/20/2019 2230   GLUCOSEU Negative 12/15/2014 0958   HGBUR NEGATIVE 10/20/2019 2230   BILIRUBINUR NEGATIVE 10/20/2019 2230   BILIRUBINUR Negative 11/29/2018 1518   BILIRUBINUR Negative 12/15/2014 0958   KETONESUR NEGATIVE 10/20/2019 2230   PROTEINUR NEGATIVE 10/20/2019 2230   NITRITE POSITIVE (A) 10/20/2019 2230   LEUKOCYTESUR LARGE (A) 10/20/2019 2230   LEUKOCYTESUR 1+ 12/15/2014 0958   Sepsis Labs Invalid input(s): PROCALCITONIN,  WBC,  LACTICIDVEN Microbiology Recent Results (from the past 240 hour(s))  Urine culture  Status: Abnormal   Collection Time: 10/20/19 10:30 PM   Specimen: Urine, Random  Result Value Ref Range Status   Specimen Description   Final    URINE, RANDOM Performed at [facility], [address] [place], [place] [address]    Special Requests   Final    NONE Performed  at [facility], [address] [place], [place] [address]    Culture >=100,000 COLONIES/mL ESCHERICHIA COLI (A)  Final   Report Status 10/23/2019 FINAL  Final   Organism ID, Bacteria ESCHERICHIA COLI (A)  Final      Susceptibility   Escherichia coli - MIC*    AMPICILLIN 8 SENSITIVE Sensitive     CEFAZOLIN <=4 SENSITIVE Sensitive     CEFTRIAXONE <=1 SENSITIVE Sensitive     CIPROFLOXACIN <=0.25 SENSITIVE Sensitive     GENTAMICIN <=1 SENSITIVE Sensitive     IMIPENEM <=0.25 SENSITIVE Sensitive     NITROFURANTOIN 128 RESISTANT Resistant     TRIMETH/SULFA <=20 SENSITIVE Sensitive     AMPICILLIN/SULBACTAM <=2 SENSITIVE Sensitive     PIP/TAZO <=4 SENSITIVE Sensitive     Extended ESBL NEGATIVE Sensitive     * >=100,000 COLONIES/mL ESCHERICHIA COLI  SARS CORONAVIRUS 2 (TAT 6-24 HRS) Nasopharyngeal Nasopharyngeal Swab     Status: None   Collection Time: 10/21/19 12:21 AM   Specimen: Nasopharyngeal Swab  Result Value Ref Range Status   SARS Coronavirus 2 NEGATIVE NEGATIVE Final    Comment: (NOTE) SARS-CoV-2 target nucleic acids are NOT DETECTED. The SARS-CoV-2 RNA is generally detectable in upper and lower respiratory specimens during the acute phase of infection. Negative results do not preclude SARS-CoV-2 infection, do not rule out co-infections with other pathogens, and should not be used as the sole basis for treatment or other patient management decisions. Negative results must be combined with clinical observations, patient history, and epidemiological information. The expected result is Negative. Fact Sheet for Patients: HairSlick.no Fact Sheet for Healthcare Providers: quierodirigir.com This test is not yet approved or cleared by the [place] FDA and  has been authorized for detection and/or diagnosis of SARS-CoV-2 by FDA under an Emergency Use Authorization (EUA). This EUA will remain  in effect (meaning this  test can be used) for the duration of the COVID-19 declaration under Section 56 4(b)(1) of the Act, 21 U.S.C. section 360bbb-3(b)(1), unless the authorization is terminated or revoked sooner. Performed at [facility], [address] [place], [place] [address]      Time coordinating discharge: Over 30 minutes  SIGNED:  Arnetha Courser, MD  [facility] 10/24/2019, 3:50 PM Pager [phone]  If 7PM-7AM, please contact night-coverage [email or web address] Password [email or web address]

## 2019-10-24 NOTE — Care Management Important Message (Signed)
Important Message  Patient Details  Name: Jessica Donovan MRN: [id number] Date of Birth: [birth year]   Medicare Important Message Given:  Yes     Juliann Pulse A Jeret Goyer 10/24/2019, 11:16 AM

## 2020-01-01 ENCOUNTER — Telehealth: Payer: Self-pay | Admitting: Cardiovascular Disease

## 2020-01-01 NOTE — Telephone Encounter (Signed)
Patient calling  Patient has been falling and it has been happening for some time  Would like to know if we could refer to a neurologist  Please call to discuss

## 2020-01-02 NOTE — Telephone Encounter (Signed)
Left voicemail message for patient to call back.

## 2020-01-03 ENCOUNTER — Telehealth: Payer: Self-pay | Admitting: Urology

## 2020-01-03 NOTE — Telephone Encounter (Signed)
Left voicemail message.

## 2020-01-03 NOTE — Telephone Encounter (Signed)
Pt called office and said at her last visit Dr Apolinar Junes had mentioned changing her antibiotic and also pt needs something cheaper.  She wasn't sure of the name of RX.

## 2020-01-04 ENCOUNTER — Telehealth: Payer: Self-pay | Admitting: Urology

## 2020-01-04 NOTE — Telephone Encounter (Signed)
Left Vm asked to return call to clarify medication. Dr. Apolinar Junes has not prescribed any medications.

## 2020-01-04 NOTE — Telephone Encounter (Signed)
Pt returned your call.  She said if you could call her back Friday morning, that would be great.

## 2020-01-04 NOTE — Telephone Encounter (Signed)
Patient returning phone call and speaking unclearly regarding medication. Patient is talking about nitrofurantoin microcrystal 50mg  and her urologist Patient is wanting to know if there is another medication that may be cheaper than the one prescribed.  Please advise when able

## 2020-01-05 NOTE — Telephone Encounter (Signed)
Attempted to call the patient. No answer- I left a message to please call back if needed.   

## 2020-01-08 NOTE — Telephone Encounter (Signed)
Patient left message with answering service on 01/05/20 at 5:22pm: RE: missed call from [place].

## 2020-01-08 NOTE — Telephone Encounter (Signed)
Spoke with patient. She is concerned about her antibiotics for her UTI issues and wants to see if Dr Mariah Milling can prescribe something that won't interfere with her heart. Advised her that she needs to consult with her PCP or urologist. She has a call into urology at this time. She also feels her walking is off balance and would like a neurology consult.  Advised that would be best from her PCP as well. I gave her the name of one in [place]. She was appreciative. No further questions at this time.

## 2020-01-09 NOTE — Telephone Encounter (Signed)
Spoke with patient, Offered follow up appointment patient declined. Wanted to resend compounded estrogen to pharmacy. Will send in as requested. Patient voiced understanding.

## 2020-01-09 NOTE — Telephone Encounter (Addendum)
Patient states she has been taking Macrobid since her last visit-could not recall where she gets it from. I do not see it on her medication list or where Dr. Apolinar Junes has prescribed it. She wanted to know if she should fill her prescription for compounded estrogen. She did not ever fill this prescription. She declines any UTI symptoms.

## 2020-01-09 NOTE — Telephone Encounter (Signed)
Pt LMOM and states that the meds that were prescribed cost her too much and she would like a call back to discuss other options.

## 2020-01-09 NOTE — Telephone Encounter (Signed)
Suppressive antibiotics are something that I generally do not prescribe unless absolutely warranted.  I never prescribed it for this patient.  She was prescribed this by her primary care physician when I saw her initially in 2019.  This might be the person that is prescribing it for her now.  Reviewing her chart it looks like she has been admitted for 2 urinary tract infections over the past 6 months.  It may warrant reevaluation I recommend that she schedule follow-up.  In addition to the above, we have called her in compounded estrogen before.  This is a much deeper option.  If she needs [place] called again again, we are happy to do so.  Vanna Scotland, MD

## 2020-01-09 NOTE — Telephone Encounter (Signed)
Left VM asked to return call 

## 2020-01-12 MED ORDER — AMBULATORY NON FORMULARY MEDICATION: 0 refills | Status: DC

## 2020-01-12 NOTE — Addendum Note (Signed)
Addended by: Milas Kocher A on: 01/12/2020 02:09 PM   Modules accepted: Orders

## 2020-02-08 ENCOUNTER — Emergency Department: Payer: Medicare Other

## 2020-02-08 ENCOUNTER — Other Ambulatory Visit: Payer: Self-pay

## 2020-02-08 ENCOUNTER — Encounter: Payer: Self-pay | Admitting: Internal Medicine

## 2020-02-08 ENCOUNTER — Observation Stay
Admission: EM | Admit: 2020-02-08 | Discharge: 2020-02-09 | Disposition: A | Discharge status: Home / Self Care | Payer: Medicare Other | Attending: Internal Medicine | Admitting: Internal Medicine

## 2020-02-08 DIAGNOSIS — E1142 Type 2 diabetes mellitus with diabetic polyneuropathy: Secondary | ICD-10-CM | POA: Insufficient documentation

## 2020-02-08 DIAGNOSIS — G894 Chronic pain syndrome: Secondary | ICD-10-CM | POA: Insufficient documentation

## 2020-02-08 DIAGNOSIS — M50321 Other cervical disc degeneration at C4-C5 level: Secondary | ICD-10-CM | POA: Insufficient documentation

## 2020-02-08 DIAGNOSIS — M869 Osteomyelitis, unspecified: Secondary | ICD-10-CM | POA: Diagnosis not present

## 2020-02-08 DIAGNOSIS — Z791 Long term (current) use of non-steroidal anti-inflammatories (NSAID): Secondary | ICD-10-CM | POA: Insufficient documentation

## 2020-02-08 DIAGNOSIS — I252 Old myocardial infarction: Secondary | ICD-10-CM | POA: Insufficient documentation

## 2020-02-08 DIAGNOSIS — Z9660 Presence of unspecified orthopedic joint implant: Secondary | ICD-10-CM | POA: Insufficient documentation

## 2020-02-08 DIAGNOSIS — R400 Somnolence: Secondary | ICD-10-CM

## 2020-02-08 DIAGNOSIS — Z881 Allergy status to other antibiotic agents status: Secondary | ICD-10-CM | POA: Insufficient documentation

## 2020-02-08 DIAGNOSIS — Z887 Allergy status to serum and vaccine status: Secondary | ICD-10-CM | POA: Insufficient documentation

## 2020-02-08 DIAGNOSIS — Z89421 Acquired absence of other right toe(s): Secondary | ICD-10-CM | POA: Insufficient documentation

## 2020-02-08 DIAGNOSIS — Z89422 Acquired absence of other left toe(s): Secondary | ICD-10-CM | POA: Diagnosis not present

## 2020-02-08 DIAGNOSIS — R4182 Altered mental status, unspecified: Principal | ICD-10-CM | POA: Diagnosis present

## 2020-02-08 DIAGNOSIS — W19XXXA Unspecified fall, initial encounter: Secondary | ICD-10-CM | POA: Insufficient documentation

## 2020-02-08 DIAGNOSIS — I10 Essential (primary) hypertension: Secondary | ICD-10-CM | POA: Insufficient documentation

## 2020-02-08 DIAGNOSIS — F411 Generalized anxiety disorder: Secondary | ICD-10-CM | POA: Insufficient documentation

## 2020-02-08 DIAGNOSIS — M6282 Rhabdomyolysis: Secondary | ICD-10-CM | POA: Insufficient documentation

## 2020-02-08 DIAGNOSIS — Z8673 Personal history of transient ischemic attack (TIA), and cerebral infarction without residual deficits: Secondary | ICD-10-CM | POA: Insufficient documentation

## 2020-02-08 DIAGNOSIS — Z7982 Long term (current) use of aspirin: Secondary | ICD-10-CM | POA: Insufficient documentation

## 2020-02-08 DIAGNOSIS — F0390 Unspecified dementia without behavioral disturbance: Secondary | ICD-10-CM | POA: Diagnosis not present

## 2020-02-08 DIAGNOSIS — Z794 Long term (current) use of insulin: Secondary | ICD-10-CM | POA: Diagnosis not present

## 2020-02-08 DIAGNOSIS — Z880 Allergy status to penicillin: Secondary | ICD-10-CM | POA: Insufficient documentation

## 2020-02-08 DIAGNOSIS — E1169 Type 2 diabetes mellitus with other specified complication: Secondary | ICD-10-CM | POA: Diagnosis not present

## 2020-02-08 DIAGNOSIS — G8929 Other chronic pain: Secondary | ICD-10-CM

## 2020-02-08 DIAGNOSIS — Z79899 Other long term (current) drug therapy: Secondary | ICD-10-CM | POA: Insufficient documentation

## 2020-02-08 DIAGNOSIS — Z20822 Contact with and (suspected) exposure to covid-19: Secondary | ICD-10-CM | POA: Diagnosis not present

## 2020-02-08 DIAGNOSIS — R0902 Hypoxemia: Secondary | ICD-10-CM

## 2020-02-08 DIAGNOSIS — R7989 Other specified abnormal findings of blood chemistry: Secondary | ICD-10-CM | POA: Insufficient documentation

## 2020-02-08 DIAGNOSIS — M359 Systemic involvement of connective tissue, unspecified: Secondary | ICD-10-CM | POA: Insufficient documentation

## 2020-02-08 DIAGNOSIS — I25119 Atherosclerotic heart disease of native coronary artery with unspecified angina pectoris: Secondary | ICD-10-CM | POA: Insufficient documentation

## 2020-02-08 DIAGNOSIS — M549 Dorsalgia, unspecified: Secondary | ICD-10-CM | POA: Diagnosis present

## 2020-02-08 DIAGNOSIS — Z882 Allergy status to sulfonamides status: Secondary | ICD-10-CM | POA: Insufficient documentation

## 2020-02-08 DIAGNOSIS — Z91012 Allergy to eggs: Secondary | ICD-10-CM | POA: Insufficient documentation

## 2020-02-08 DIAGNOSIS — I959 Hypotension, unspecified: Secondary | ICD-10-CM | POA: Insufficient documentation

## 2020-02-08 DIAGNOSIS — I25810 Atherosclerosis of coronary artery bypass graft(s) without angina pectoris: Secondary | ICD-10-CM

## 2020-02-08 DIAGNOSIS — Z885 Allergy status to narcotic agent status: Secondary | ICD-10-CM | POA: Insufficient documentation

## 2020-02-08 DIAGNOSIS — Z888 Allergy status to other drugs, medicaments and biological substances status: Secondary | ICD-10-CM | POA: Insufficient documentation

## 2020-02-08 DIAGNOSIS — Z8249 Family history of ischemic heart disease and other diseases of the circulatory system: Secondary | ICD-10-CM | POA: Insufficient documentation

## 2020-02-08 DIAGNOSIS — Z955 Presence of coronary angioplasty implant and graft: Secondary | ICD-10-CM | POA: Insufficient documentation

## 2020-02-08 LAB — URINE DRUG SCREEN, QUALITATIVE (ARMC ONLY)
Amphetamines, Ur Screen: NOT DETECTED
Barbiturates, Ur Screen: NOT DETECTED
Benzodiazepine, Ur Scrn: POSITIVE — AB
Cannabinoid 50 Ng, Ur ~~LOC~~: NOT DETECTED
Cocaine Metabolite,Ur ~~LOC~~: NOT DETECTED
MDMA (Ecstasy)Ur Screen: NOT DETECTED
Methadone Scn, Ur: NOT DETECTED
Opiate, Ur Screen: POSITIVE — AB
Phencyclidine (PCP) Ur S: NOT DETECTED
Tricyclic, Ur Screen: POSITIVE — AB

## 2020-02-08 LAB — CBC WITH DIFFERENTIAL/PLATELET
Abs Immature Granulocytes: 0.03 10*3/uL (ref 0.00–0.07)
Basophils Absolute: 0.1 10*3/uL (ref 0.0–0.1)
Basophils Relative: 1 %
Eosinophils Absolute: 0.3 10*3/uL (ref 0.0–0.5)
Eosinophils Relative: 5 %
HCT: 41.8 % (ref 36.0–46.0)
Hemoglobin: 12.9 g/dL (ref 12.0–15.0)
Immature Granulocytes: 0 %
Lymphocytes Relative: 32 %
Lymphs Abs: 2.3 10*3/uL (ref 0.7–4.0)
MCH: 31.9 pg (ref 26.0–34.0)
MCHC: 30.9 g/dL (ref 30.0–36.0)
MCV: 103.5 fL — ABNORMAL HIGH (ref 80.0–100.0)
Monocytes Absolute: 1 10*3/uL (ref 0.1–1.0)
Monocytes Relative: 14 %
Neutro Abs: 3.4 10*3/uL (ref 1.7–7.7)
Neutrophils Relative %: 48 %
Platelets: 245 10*3/uL (ref 150–400)
RBC: 4.04 MIL/uL (ref 3.87–5.11)
RDW: 12.9 % (ref 11.5–15.5)
WBC: 7 10*3/uL (ref 4.0–10.5)
nRBC: 0 % (ref 0.0–0.2)

## 2020-02-08 LAB — COMPREHENSIVE METABOLIC PANEL
ALT: 38 U/L (ref 0–44)
AST: 106 U/L — ABNORMAL HIGH (ref 15–41)
Albumin: 3.5 g/dL (ref 3.5–5.0)
Alkaline Phosphatase: 90 U/L (ref 38–126)
Anion gap: 7 (ref 5–15)
BUN: 28 mg/dL — ABNORMAL HIGH (ref 8–23)
CO2: 30 mmol/L (ref 22–32)
Calcium: 9.2 mg/dL (ref 8.9–10.3)
Chloride: 102 mmol/L (ref 98–111)
Creatinine, Ser: 0.99 mg/dL (ref 0.44–1.00)
GFR calc Af Amer: >60 mL/min (ref >60)
GFR calc non Af Amer: 57 mL/min — ABNORMAL LOW (ref >60)
Glucose, Bld: 145 mg/dL — ABNORMAL HIGH (ref 70–99)
Potassium: 4.9 mmol/L (ref 3.5–5.1)
Sodium: 139 mmol/L (ref 135–145)
Total Bilirubin: 0.4 mg/dL (ref 0.3–1.2)
Total Protein: 6.5 g/dL (ref 6.5–8.1)

## 2020-02-08 LAB — ETHANOL: Alcohol, Ethyl (B): <10 mg/dL (ref <10)

## 2020-02-08 LAB — URINALYSIS, COMPLETE (UACMP) WITH MICROSCOPIC
Bacteria, UA: NONE SEEN
Bilirubin Urine: NEGATIVE
Glucose, UA: NEGATIVE mg/dL
Hgb urine dipstick: NEGATIVE
Ketones, ur: NEGATIVE mg/dL
Leukocytes,Ua: NEGATIVE
Nitrite: NEGATIVE
Protein, ur: NEGATIVE mg/dL
Specific Gravity, Urine: 1.01 (ref 1.005–1.030)
pH: 5 (ref 5.0–8.0)

## 2020-02-08 LAB — ACETAMINOPHEN LEVEL: Acetaminophen (Tylenol), Serum: <10 ug/mL — ABNORMAL LOW (ref 10–30)

## 2020-02-08 LAB — GLUCOSE, CAPILLARY: Glucose-Capillary: 221 mg/dL — ABNORMAL HIGH (ref 70–99)

## 2020-02-08 LAB — TROPONIN I (HIGH SENSITIVITY): Troponin I (High Sensitivity): 5 ng/L (ref <18)

## 2020-02-08 LAB — SALICYLATE LEVEL: Salicylate Lvl: <7 mg/dL — ABNORMAL LOW (ref 7.0–30.0)

## 2020-02-08 MED ORDER — LACTATED RINGERS IV BOLUS
1000.0000 mL | Freq: Once | INTRAVENOUS | Status: AC
  Administered 2020-02-08: 1000 mL via INTRAVENOUS

## 2020-02-08 MED ORDER — ENOXAPARIN SODIUM 40 MG/0.4ML ~~LOC~~ SOLN
40.0000 mg | SUBCUTANEOUS | Status: DC
  Administered 2020-02-08: 40 mg via SUBCUTANEOUS
  Filled 2020-02-08: qty 0.4

## 2020-02-08 MED ORDER — ASPIRIN EC 81 MG PO TBEC
81.0000 mg | DELAYED_RELEASE_TABLET | Freq: Every day | ORAL | Status: DC
  Administered 2020-02-09: 81 mg via ORAL
  Filled 2020-02-08: qty 1

## 2020-02-08 MED ORDER — PANTOPRAZOLE SODIUM 40 MG PO TBEC
40.0000 mg | DELAYED_RELEASE_TABLET | Freq: Every day | ORAL | Status: DC
  Administered 2020-02-09: 40 mg via ORAL
  Filled 2020-02-08: qty 1

## 2020-02-08 MED ORDER — ATORVASTATIN CALCIUM 20 MG PO TABS
20.0000 mg | ORAL_TABLET | Freq: Every day | ORAL | Status: DC
  Administered 2020-02-09: 20 mg via ORAL
  Filled 2020-02-08: qty 1

## 2020-02-08 MED ORDER — LISINOPRIL 10 MG PO TABS: 20.0000 mg | ORAL_TABLET | Freq: Two times a day (BID) | ORAL | Status: DC

## 2020-02-08 MED ORDER — VENLAFAXINE HCL ER 75 MG PO CP24
75.0000 mg | ORAL_CAPSULE | Freq: Three times a day (TID) | ORAL | Status: DC
  Administered 2020-02-08 – 2020-02-09 (×2): 75 mg via ORAL
  Filled 2020-02-08 (×2): qty 1

## 2020-02-08 MED ORDER — FERROUS SULFATE 325 (65 FE) MG PO TABS
325.0000 mg | ORAL_TABLET | Freq: Every day | ORAL | Status: DC
  Administered 2020-02-09: 325 mg via ORAL
  Filled 2020-02-08: qty 1

## 2020-02-08 MED ORDER — METOPROLOL TARTRATE 25 MG PO TABS: 25.0000 mg | ORAL_TABLET | Freq: Two times a day (BID) | ORAL | Status: DC

## 2020-02-08 MED ORDER — DIPHENOXYLATE-ATROPINE 2.5-0.025 MG PO TABS: 1.0000 | ORAL_TABLET | ORAL | Status: DC | PRN

## 2020-02-08 MED ORDER — ADULT MULTIVITAMIN W/MINERALS CH
1.0000 | ORAL_TABLET | Freq: Every day | ORAL | Status: DC
  Administered 2020-02-09: 1 via ORAL
  Filled 2020-02-08: qty 1

## 2020-02-08 MED ORDER — ISOSORBIDE MONONITRATE ER 60 MG PO TB24: 30.0000 mg | ORAL_TABLET | Freq: Every day | ORAL | Status: DC

## 2020-02-08 MED ORDER — NALOXONE HCL 2 MG/2ML IJ SOSY
0.4000 mg | PREFILLED_SYRINGE | Freq: Once | INTRAMUSCULAR | Status: AC
  Administered 2020-02-08: 0.4 mg via INTRAVENOUS
  Filled 2020-02-08: qty 2

## 2020-02-08 NOTE — ED Notes (Signed)
Admitting MD at bedside. Pt showing run of VTACH on monitor. MD made aware.

## 2020-02-08 NOTE — ED Triage Notes (Signed)
Pt to ED via [facility] from home. Per EMS pt had a fall. Per EMS pt's home health nurse noticed pt was more lethargic and unarousable. Pt baseline A&Ox4. Per EMS pt was A&Ox4 but would intermittently become lethargic. Per EMS pt RA sats 86%. Pt placed on 2L [place] with improvement to 95%. Per EMS pt c/o low back pain and generalized weakness. CBG 165.   Upon arrival pt carousals to voice and sternal rub. This RN asking pt questions and pt goes in and out of consciousness. Pt RA sats 90%. Pt placed on 2L [place] with improvement to 98%.

## 2020-02-08 NOTE — ED Notes (Signed)
MD at bedside. 

## 2020-02-08 NOTE — ED Provider Notes (Addendum)
[facility]  Emergency Department Provider Note   ____________________________________________   First MD Initiated Contact with Patient 02/08/20 1432     (approximate)  I have reviewed the triage vital signs and the nursing notes.   HISTORY  Chief Complaint Altered Mental Status and Weakness    HPI Jessica Donovan is a 74 y.o. female with past medical history of CAD, hypertension, diabetes, and chronic back pain who presents to the ED for altered mental status. History is limited due to patient somnolence. Per EMS, 911 was called by patient's home health nurse after she had a fall. The nurse had also noticed the patient seemed more lethargic than usual and difficult to arouse. At her baseline, she is reportedly alert and oriented x4 and when EMS was able to wake her she was able to answer questions appropriately, but quickly went back to sleep. She was noted to be borderline hypotensive by EMS as well as having O2 sats of 86% on room air. She was given a fluid bolus and started on 2 L nasal cannula with improvement. Patient is able to follow commands but has a difficult time answering questions due to somnolence. She arouses to sternal rub and when asked if she is in pain states "only when you do that".        Past Medical History:  Diagnosis Date  . Anxiety   . Chronic back pain   . Collagen vascular disease (HCC)   . Coronary artery disease    a. s/p PCI/DES to dRCA & mLCx in 2014 with repeat LHC in 10/2015 showing patent stents  . Diabetes mellitus without complication (HCC)   . Hypertension   . Low back pain   . Myocardial infarction (HCC)   . Osteomyelitis of toe (HCC) 01/23/2016  . Stroke [facility])     Patient Active Problem List   Diagnosis Date Noted  . Pressure injury of skin 10/23/2019  . Fall   . HTN (hypertension) 10/20/2019  . Right foot infection 12/19/2018  . Acute encephalopathy 07/06/2018  . Anxiety 06/10/2017  . Chest pain with  moderate risk for cardiac etiology 06/09/2017  . UTI (urinary tract infection) 05/19/2017  . CAD (coronary artery disease) 05/19/2017  . Chronic narcotic use 05/19/2017  . Cellulitis in diabetic foot (HCC) 05/19/2017  . Opioid overdose (HCC) 02/23/2017  . Altered mental status 02/23/2017  . Acute colitis 01/19/2017  . Sepsis (HCC) 01/17/2017  . Cellulitis of fourth toe of right foot 04/10/2016  . Somnolence 04/09/2016  . Dementia (HCC) 04/09/2016  . Cellulitis 04/03/2016  . Hematoma of right parietal scalp 02/23/2016  . Multiple falls 02/23/2016  . Physical debility 02/23/2016  . Type II diabetes mellitus, uncontrolled (HCC) 02/23/2016  . Syncope and collapse 02/23/2016  . Polypharmacy 02/23/2016  . Osteomyelitis of toe (HCC) 01/23/2016  . Rhabdomyolysis 12/07/2015  . ARF (acute renal failure) (HCC) 11/29/2015  . Hypotension 11/29/2015  . 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 Angina effort 11/07/2015  . Osteomyelitis (HCC) 11/07/2015  . Anemia due to chronic blood loss 06/14/2015  . Generalized anxiety disorder 06/14/2015  . Lumbago with sciatica 06/14/2015  . 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  . 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 Abnormal weight loss 05/18/2014  . Chronic pain syndrome 05/18/2014  . 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 Diabetic polyneuropathy (HCC) 05/18/2014  . Type 2 diabetes mellitus with  diabetic neuropathy (HCC) 05/07/2014  . Hemorrhage of rectum and anus 01/17/2014  . Cellulitis and abscess of foot excluding toe 05/18/2012    Past Surgical History:  Procedure Laterality Date  . AMPUTATION TOE Left 11/10/2015   Procedure: AMPUTATION TOE;  Surgeon: Linus Galas, MD;  Location: [facility] ORS;  Service: Podiatry;  Laterality: Left;  . AMPUTATION TOE Left 01/24/2016   Procedure: AMPUTATION TOE (2nd mpj);   Surgeon: Linus Galas, DPM;  Location: [facility] ORS;  Service: Podiatry;  Laterality: Left;  . AMPUTATION TOE Right 12/21/2018   Procedure: 2ND TOE AMPUTATION WITH DEBRIDEMENT OF SOFT TISSUE;  Surgeon: Recardo Evangelist, DPM;  Location: [facility] ORS;  Service: Podiatry;  Laterality: Right;  . CARDIAC CATHETERIZATION N/A 11/12/2015   Procedure: Left Heart Cath and Coronary Angiography;  Surgeon: Marcina Millard, MD;  Location: [facility] INVASIVE CV LAB;  Service: Cardiovascular;  Laterality: N/A;  . COLONOSCOPY WITH PROPOFOL N/A 07/20/2017   Procedure: COLONOSCOPY WITH PROPOFOL;  Surgeon: Wyline Mood, MD;  Location: [facility] ENDOSCOPY;  Service: Endoscopy;  Laterality: N/A;  . JOINT REPLACEMENT    . TOE AMPUTATION      Prior to Admission medications   Medication Sig Start Date End Date Taking? Authorizing Provider  acetaminophen (TYLENOL) 500 MG tablet Take 1,000 mg by mouth every 8 (eight) hours as needed for mild pain or headache.     [provider]  ALPRAZolam Prudy Feeler) 1 MG tablet Take 1 tablet (1 mg total) by mouth 3 (three) times daily. 12/23/18   Jama Flavors, MD  AMBULATORY NON FORMULARY MEDICATION Compounded estrogen  Apply 1 gram vaginally M,W, Friday [facility] 01/12/20   Vanna Scotland, MD  aspirin 81 MG tablet Take 81 mg by mouth daily.    [provider]  atorvastatin (LIPITOR) 20 MG tablet Take 20 mg by mouth daily.     [provider]  diphenoxylate-atropine (LOMOTIL) 2.5-0.025 MG tablet Take 1 tablet by mouth every 4 (four) hours as needed for diarrhea or loose stools.     [provider]  ferrous sulfate 325 (65 FE) MG tablet Take 325 mg by mouth daily with breakfast.    [provider]  insulin aspart (NOVOLOG) 100 UNIT/ML injection Inject 3-15 Units into the skin 3 (three) times daily with meals as needed for high blood sugar. Pt uses as needed per sliding scale:    Less than 140:  0 units  140-180:  3 units 181-220:  4 units 221- 260:  6  units 261- 320:  8 units 321-360:  10 units 361-400:  12 units Greater than 400:  15 units    [provider]  isosorbide mononitrate (IMDUR) 30 MG 24 hr tablet Take 30 mg by mouth daily.     [provider]  lisinopril (ZESTRIL) 20 MG tablet Take 20 mg by mouth 2 (two) times daily. 08/08/19   [provider]  metoprolol tartrate (LOPRESSOR) 25 MG tablet Take 25 mg by mouth 2 (two) times daily. 01/28/17   [provider]  mirtazapine (REMERON) 15 MG tablet Take 15 mg by mouth at bedtime.    [provider]  morphine (MSIR) 15 MG tablet Take 1 tablet (15 mg total) by mouth 3 (three) times daily. Patient taking differently: Take 15 mg by mouth every 4 (four) hours as needed (pain).  12/23/18   Enid Baas Jude, MD  omeprazole (PRILOSEC) 20 MG capsule Take 20 mg by mouth daily.    [provider]  TOUJEO SOLOSTAR 300 UNIT/ML SOPN  Inject 50 Units into the skin daily. 08/08/19   [provider]  traZODone (DESYREL) 100 MG tablet Take 100 mg by mouth at bedtime. 01/28/17   [provider]  venlafaxine XR (EFFEXOR-XR) 75 MG 24 hr capsule Take 75 mg by mouth 3 (three) times daily.    [provider]  Vitamin D, Ergocalciferol, (DRISDOL) 50000 units CAPS capsule Take 50,000 Units by mouth every Sunday.     [provider]    Allergies Ampicillin, Bactrim [sulfamethoxazole-trimethoprim], Codeine, Eggs or egg-derived products, Flu virus vaccine, Influenza vaccines, Methadone, Oxycodone-acetaminophen, Percocet [oxycodone-acetaminophen], Sulfa antibiotics, Tetanus antitoxin, Tetanus toxoids, Penicillin g, and Tetracyclines & related  Family History  Problem Relation Age of Onset  . CAD Mother   . CAD Father     Social History Social History   Tobacco Use  . Smoking status: Never Smoker  . Smokeless tobacco: Never Used  Substance Use Topics  . Alcohol use: No  . Drug use: No    Review of Systems Unable to obtain  secondary to altered mental status  ____________________________________________   PHYSICAL EXAM:  VITAL SIGNS: ED Triage Vitals  Enc Vitals Group     BP      Pulse      Resp      Temp      Temp src      SpO2      Weight      Height      Head Circumference      Peak Flow      Pain Score      Pain Loc      Pain Edu?      Excl. in GC?     Constitutional: Somnolent but arousable with sternal rub, difficulty answering questions but follows commands appropriately. Eyes: Conjunctivae are normal. Pupils equal round and reactive to light bilaterally. Head: Atraumatic. Nose: No congestion/rhinnorhea. Mouth/Throat: Mucous membranes are moist. Neck: Normal ROM Cardiovascular: Normal rate, regular rhythm. Grossly normal heart sounds. Respiratory: Bradypneic with normal respiratory effort.  No retractions. Lungs CTAB. Gastrointestinal: Soft and nontender. No distention. Genitourinary: deferred Musculoskeletal: No lower extremity tenderness nor edema. Neurologic: Slow to respond verbally. No gross focal neurologic deficits are appreciated. Follows commands and moves all extremities equally. Skin:  Skin is warm, dry and intact. No rash noted. Psychiatric: Mood and affect are normal. Speech and behavior are normal.  ____________________________________________   LABS (all labs ordered are listed, but only abnormal results are displayed)  Labs Reviewed  CBC WITH DIFFERENTIAL/PLATELET - Abnormal; Notable for the following components:      Result Value   MCV 103.5 (*)    All other components within normal limits  COMPREHENSIVE METABOLIC PANEL - Abnormal; Notable for the following components:   Glucose, Bld 145 (*)    BUN 28 (*)    AST 106 (*)    GFR calc non Af Amer 57 (*)    All other components within normal limits  URINALYSIS, COMPLETE (UACMP) WITH MICROSCOPIC - Abnormal; Notable for the following components:   Color, Urine YELLOW (*)    APPearance CLEAR (*)    All other  components within normal limits  SALICYLATE LEVEL - Abnormal; Notable for the following components:   Salicylate Lvl <7.0 (*)    All other components within normal limits  ACETAMINOPHEN LEVEL - Abnormal; Notable for the following components:   Acetaminophen (Tylenol), Serum <10 (*)    All other components within normal limits  URINE DRUG SCREEN, QUALITATIVE [facility]  ONLY) - Abnormal; Notable for the following components:   Tricyclic, Ur Screen POSITIVE (*)    Opiate, Ur Screen POSITIVE (*)    Benzodiazepine, Ur Scrn POSITIVE (*)    All other components within normal limits  SARS CORONAVIRUS 2 (TAT 6-24 HRS)  ETHANOL  TROPONIN I (HIGH SENSITIVITY)   ____________________________________________  EKG  ED ECG REPORT I, Blake Divine, the attending physician, personally viewed and interpreted this ECG.   Date: 02/08/2020  EKG Time: 14:49  Rate: 59  Rhythm: normal sinus rhythm  Axis: LAD  Intervals:none  ST&T Change: None   PROCEDURES  Procedure(s) performed (including Critical Care):  .Critical Care Performed by: Blake Divine, MD Authorized by: Blake Divine, MD   Critical care provider statement:    Critical care time (minutes):  45   Critical care time was exclusive of:  Separately billable procedures and treating other patients and teaching time   Critical care was necessary to treat or prevent imminent or life-threatening deterioration of the following conditions:  Respiratory failure   Critical care was time spent personally by me on the following activities:  Discussions with consultants, evaluation of patient's response to treatment, examination of patient, ordering and performing treatments and interventions, ordering and review of laboratory studies, ordering and review of radiographic studies, pulse oximetry, re-evaluation of patient's condition, obtaining history from patient or surrogate and review of old charts   I assumed direction of critical care for this  patient from another provider in my specialty: no       ____________________________________________   INITIAL IMPRESSION / ASSESSMENT AND PLAN / ED COURSE       74 year old female with history of CAD, hypertension, diabetes, and chronic back pain presents to the ED following reported fall with increased lethargy and somnolence. She is lethargic appearing here, arouses only to sternal rub but when aroused is able to follow commands with no apparent focal neurologic deficits. No obvious signs of trauma but given reported fall and change in mental status, will check CT head. Given patient's history, polypharmacy seems to be the most likely etiology, but will also evaluate for infectious process. No abdominal tenderness noted on exam. She is maintaining her O2 sats on 2 L nasal cannula for now, blood pressure borderline low and we will hydrate with IV fluids.  Patient was given test dose of Narcan without any change.  CT head negative for acute process.  Chest x-ray and UA showed no evidence of infection and lab work is unremarkable.  UDS positive for tricyclics, benzodiazepines, and opiates.  Patient is prescribed high doses of both Xanax and morphine and there is likely an element of polypharmacy contributing to her somnolence and lethargy.  She continues to require 2 L nasal cannula and we will admit for further observation.      ____________________________________________   FINAL CLINICAL IMPRESSION(S) / ED DIAGNOSES  Final diagnoses:  Altered mental status, unspecified altered mental status type  Polypharmacy  Chronic back pain, unspecified back location, unspecified back pain laterality     ED Discharge Orders    None       Note:  This document was prepared using Dragon voice recognition software and may include unintentional dictation errors.   Blake Divine, MD 02/08/20 Isaac Laud, MD 02/08/20 [phone]

## 2020-02-08 NOTE — H&P (Addendum)
History and Physical    Jessica Donovan VQQ:[id number] DOB: 1946/05/16 DOA: 02/08/2020  PCP: Robyn Haber, MD  Patient coming from: Home   Chief Complaint: mental status change/somnolence  HPI: Jessica Donovan is a 74 y.o. female with medical history significant  for diabetes, hypertension and chronic back pain, coronary artery disease was brought in when home health nurse noticed patient was lethargic and unarousable.  Per EMS Per ER note patient had a fall.  History is very limited as patient is very somnolent barely responding other than a moan to deep sternal rub.  History was taken from ER attendings documentation.  Apparently at baseline patient is alert and oriented x4 and when EMS was able to wake her she was able to answer questions appropriately but quickly went back to sleep.  She was noted to be borderline hypotensive by EMS as well as having O2 sats of 86 on room air.   ED Course: In the ED patient was found with blood pressure of 90/59 and a repeat at 84/46.  Was given fluid bolus and was placed on 2 L nasal cannula and she her O2 saturation was 86% on room air which improved to 95%.  Per EMS patient had complained of low back pain and generalized weakness.  Her blood glucose level was 165. While I was doing deep sternal rub the telemetry monitor showed "NSVT", but it did not appear as such to me.   In the ED she was given a dose of Narcan without any change.  CT of the head was negative for acute process.  Chest x-ray and UA will there were no evidence of infection and lab work was pretty much unremarkable.  Urine drug screen was positive for tricyclic, benzodiazepine, and opiates  Review of Systems: All systems reviewed and otherwise negative.    Past Medical History:  Diagnosis Date  . Anxiety   . Chronic back pain   . Collagen vascular disease ([place])   . Coronary artery disease    a. s/p PCI/DES to dRCA & mLCx in 2014 with repeat LHC in 10/2015 showing patent stents  .  Diabetes mellitus without complication ([place])   . Hypertension   . Low back pain   . Myocardial infarction ([place])   . Osteomyelitis of toe ([place]) 01/23/2016  . Stroke [facility])     Past Surgical History:  Procedure Laterality Date  . AMPUTATION TOE Left 11/10/2015   Procedure: AMPUTATION TOE;  Surgeon: Sharlotte Alamo, MD;  Location: [facility] ORS;  Service: Podiatry;  Laterality: Left;  . AMPUTATION TOE Left 01/24/2016   Procedure: AMPUTATION TOE (2nd mpj);  Surgeon: Sharlotte Alamo, DPM;  Location: [facility] ORS;  Service: Podiatry;  Laterality: Left;  . AMPUTATION TOE Right 12/21/2018   Procedure: 2ND TOE AMPUTATION WITH DEBRIDEMENT OF SOFT TISSUE;  Surgeon: Albertine Patricia, DPM;  Location: [facility] ORS;  Service: Podiatry;  Laterality: Right;  . CARDIAC CATHETERIZATION N/A 11/12/2015   Procedure: Left Heart Cath and Coronary Angiography;  Surgeon: Isaias Cowman, MD;  Location: [place] CV LAB;  Service: Cardiovascular;  Laterality: N/A;  . COLONOSCOPY WITH PROPOFOL N/A 07/20/2017   Procedure: COLONOSCOPY WITH PROPOFOL;  Surgeon: Jonathon Bellows, MD;  Location: [facility] ENDOSCOPY;  Service: Endoscopy;  Laterality: N/A;  . JOINT REPLACEMENT    . TOE AMPUTATION       reports that she has never smoked. She has never used smokeless tobacco. She reports that she does not drink alcohol or use drugs.  Allergies  Allergen Reactions  .  Ampicillin Nausea And Vomiting and Other (See Comments)    Did it involve swelling of the face/tongue/throat, SOB, or low BP? Yes Did it involve sudden or severe rash/hives, skin peeling, or any reaction on the inside of your mouth or nose? No Did you need to seek medical attention at a hospital or doctor's office? No When did it last happen?>10 years If all above answers are "NO", may proceed with cephalosporin use.   . Bactrim [Sulfamethoxazole-Trimethoprim] Other (See Comments)    Reaction: unknown  . Codeine Nausea And Vomiting  . Eggs Or Egg-Derived Products     Loss of  consciousness per pt  . Flu Virus Vaccine Other (See Comments)    Reaction: Passed out for 12 days  . Influenza Vaccines Other (See Comments)    Reaction:  Caused pt to pass out   . Methadone Hives and Itching  . Oxycodone-Acetaminophen Nausea And Vomiting  . Percocet [Oxycodone-Acetaminophen] Nausea And Vomiting  . Sulfa Antibiotics Other (See Comments)    Reaction: unknown  . Tetanus Antitoxin Swelling and Other (See Comments)    Reaction: injection site swelling  . Tetanus Toxoids Swelling and Other (See Comments)    Reaction:  Swelling at injection site  . Penicillin G Rash    Has patient had a PCN reaction causing immediate rash, facial/tongue/throat swelling, SOB or lightheadedness with hypotension: Yes Has patient had a PCN reaction causing severe rash involving mucus membranes or skin necrosis: No Has patient had a PCN reaction that required hospitalization: No Has patient had a PCN reaction occurring within the last 10 years: No If all of the above answers are "NO", then may proceed with Cephalosporin use..  . Tetracyclines & Related Rash    Family History  Problem Relation Age of Onset  . CAD Mother   . CAD Father      Prior to Admission medications   Medication Sig Start Date End Date Taking? Authorizing Provider  acetaminophen (TYLENOL) 500 MG tablet Take 1,000 mg by mouth every 8 (eight) hours as needed for mild pain or headache.    Yes [provider]  ALPRAZolam Prudy Feeler) 1 MG tablet Take 1 tablet (1 mg total) by mouth 3 (three) times daily. 12/23/18  Yes Ojie, Jude, MD  aspirin 81 MG tablet Take 81 mg by mouth daily.   Yes [provider]  atorvastatin (LIPITOR) 20 MG tablet Take 20 mg by mouth daily.    Yes [provider]  celecoxib (CELEBREX) 100 MG capsule Take 100 mg by mouth 2 (two) times daily. 02/07/20  Yes [provider]  diphenoxylate-atropine (LOMOTIL) 2.5-0.025 MG tablet Take 1 tablet by mouth every 4 (four) hours as  needed for diarrhea or loose stools.    Yes [provider]  ferrous sulfate 325 (65 FE) MG tablet Take 325 mg by mouth daily with breakfast.   Yes [provider]  insulin aspart (NOVOLOG) 100 UNIT/ML injection Inject 3-15 Units into the skin 3 (three) times daily with meals as needed for high blood sugar. Pt uses as needed per sliding scale:    Less than 140:  0 units  140-180:  3 units 181-220:  4 units 221- 260:  6 units 261- 320:  8 units 321-360:  10 units 361-400:  12 units Greater than 400:  15 units   Yes [provider]  isosorbide mononitrate (IMDUR) 30 MG 24 hr tablet Take 30 mg by mouth daily.    Yes [provider]  lisinopril (  ZESTRIL) 20 MG tablet Take 20 mg by mouth 2 (two) times daily. 08/08/19  Yes [provider]  metoprolol tartrate (LOPRESSOR) 25 MG tablet Take 25 mg by mouth 2 (two) times daily. 01/28/17  Yes [provider]  mirtazapine (REMERON) 15 MG tablet Take 15 mg by mouth at bedtime.   Yes [provider]  morphine (MSIR) 15 MG tablet Take 1 tablet (15 mg total) by mouth 3 (three) times daily. Patient taking differently: Take 15 mg by mouth every 4 (four) hours as needed (pain).  12/23/18  Yes Ojie, Jude, MD  omeprazole (PRILOSEC) 20 MG capsule Take 20 mg by mouth daily.   Yes [provider]  TOUJEO SOLOSTAR 300 UNIT/ML SOPN Inject 50 Units into the skin daily. 08/08/19  Yes [provider]  traZODone (DESYREL) 100 MG tablet Take 100 mg by mouth at bedtime. 01/28/17  Yes [provider]  venlafaxine XR (EFFEXOR-XR) 75 MG 24 hr capsule Take 75 mg by mouth 3 (three) times daily.   Yes [provider]  Vitamin D, Ergocalciferol, (DRISDOL) 50000 units CAPS capsule Take 50,000 Units by mouth every Sunday.    Yes [provider]  AMBULATORY NON FORMULARY MEDICATION Compounded estrogen  Apply 1 gram vaginally M,W, Friday [facility] 01/12/20   Vanna Scotland,  MD  doxycycline (VIBRA-TABS) 100 MG tablet Take 100 mg by mouth 2 (two) times daily. 01/31/20   [provider]    Physical Exam: Vitals:   02/08/20 1646 02/08/20 1700 02/08/20 1715 02/08/20 1730  BP: (!) 152/86 112/76 114/90 133/65  Pulse: 82 70 70 67  Resp: (!) 23 (!) 26 18 19   Temp:      TempSrc:      SpO2: 96% 100% 99% 98%  Height:        Constitutional: NAD, calm, comfortable Vitals:   02/08/20 1646 02/08/20 1700 02/08/20 1715 02/08/20 1730  BP: (!) 152/86 112/76 114/90 133/65  Pulse: 82 70 70 67  Resp: (!) 23 (!) 26 18 19   Temp:      TempSrc:      SpO2: 96% 100% 99% 98%  Height:       GEN: Eyes closed.  Snoring.  Very difficult to arouse with deep sternal rub  Neck: Large normal, supple, no masses, no thyromegaly Respiratory: clear to auscultation bilaterally anteriorly with poor respiratory effort, no wheezing, no crackles. Normal respiratory effort. No accessory muscle use.  Cardiovascular: Regular rate and rhythm, no murmurs / rubs / gallops. No extremity edema. Abdomen: no tenderness, soft nondistended  Bowel sounds positive.  Musculoskeletal: no clubbing / cyanosis.  Skin: Warm dry Neurologic: Unable to assess Psychiatric: Unable to assess    Labs on Admission: I have personally reviewed following labs and imaging studies  CBC: Recent Labs  Lab 02/08/20 1518  WBC 7.0  NEUTROABS 3.4  HGB 12.9  HCT 41.8  MCV 103.5*  PLT 245   Basic Metabolic Panel: Recent Labs  Lab 02/08/20 1518  NA 139  K 4.9  CL 102  CO2 30  GLUCOSE 145*  BUN 28*  CREATININE 0.99  CALCIUM 9.2   GFR: CrCl cannot be calculated (Unknown ideal weight.). Liver Function Tests: Recent Labs  Lab 02/08/20 1518  AST 106*  ALT 38  ALKPHOS 90  BILITOT 0.4  PROT 6.5  ALBUMIN 3.5   No results for input(s): LIPASE, AMYLASE in the last 168 hours. No results for input(s): AMMONIA in the last 168 hours. Coagulation Profile: No results for  input(s): INR, PROTIME in the  last 168 hours. Cardiac Enzymes: No results for input(s): CKTOTAL, CKMB, CKMBINDEX, TROPONINI in the last 168 hours. BNP (last 3 results) No results for input(s): PROBNP in the last 8760 hours. HbA1C: No results for input(s): HGBA1C in the last 72 hours. CBG: No results for input(s): GLUCAP in the last 168 hours. Lipid Profile: No results for input(s): CHOL, HDL, LDLCALC, TRIG, CHOLHDL, LDLDIRECT in the last 72 hours. Thyroid Function Tests: No results for input(s): TSH, T4TOTAL, FREET4, T3FREE, THYROIDAB in the last 72 hours. Anemia Panel: No results for input(s): VITAMINB12, FOLATE, FERRITIN, TIBC, IRON, RETICCTPCT in the last 72 hours. Urine analysis:    Component Value Date/Time   COLORURINE YELLOW (A) 02/08/2020 1637   APPEARANCEUR CLEAR (A) 02/08/2020 1637   APPEARANCEUR Cloudy (A) 11/29/2018 1518   LABSPEC 1.010 02/08/2020 1637   LABSPEC 1.014 12/15/2014 0958   PHURINE 5.0 02/08/2020 1637   GLUCOSEU NEGATIVE 02/08/2020 1637   GLUCOSEU Negative 12/15/2014 0958   HGBUR NEGATIVE 02/08/2020 1637   BILIRUBINUR NEGATIVE 02/08/2020 1637   BILIRUBINUR Negative 11/29/2018 1518   BILIRUBINUR Negative 12/15/2014 0958   KETONESUR NEGATIVE 02/08/2020 1637   PROTEINUR NEGATIVE 02/08/2020 1637   NITRITE NEGATIVE 02/08/2020 1637   LEUKOCYTESUR NEGATIVE 02/08/2020 1637   LEUKOCYTESUR 1+ 12/15/2014 0958    Radiological Exams on Admission: CT Head Wo Contrast  Result Date: 02/08/2020 CLINICAL DATA:  Altered mental status following a fall. EXAM: CT HEAD WITHOUT CONTRAST TECHNIQUE: Contiguous axial images were obtained from the base of the skull through the vertex without intravenous contrast. COMPARISON:  10/20/2019 FINDINGS: Brain: Stable mildly enlarged ventricles and cortical sulci. Stable minimal patchy white matter low density in both cerebral hemispheres. No intracranial hemorrhage, mass lesion or CT evidence of acute infarction. Vascular: No hyperdense vessel or unexpected  calcification. Skull: Normal. Negative for fracture or focal lesion. Sinuses/Orbits: Status post bilateral cataract extraction. Unremarkable bones and included paranasal sinuses. Other: None. IMPRESSION: 1. No acute abnormality. 2. Stable mild atrophy and minimal chronic small vessel white matter ischemic changes. Electronically Signed   By: Beckie Salts M.D.   On: 02/08/2020 15:15   DG Chest Portable 1 View  Result Date: 02/08/2020 CLINICAL DATA:  Low back pain and generalized weakness following a fall. Shortness of breath. EXAM: PORTABLE CHEST 1 VIEW COMPARISON:  05/10/2019 FINDINGS: Poor inspiration with diffuse crowding of the lung markings. Interval diffuse increase in prominence of the interstitial markings with less prominent pulmonary vasculature. No pleural fluid or airspace consolidation. Normal sized heart. Thoracic spine and bilateral shoulder degenerative changes. IMPRESSION: Interval mild interstitial pulmonary edema or pneumonitis with a poor inspiration resulting in diffuse crowding of the lung markings. Electronically Signed   By: Beckie Salts M.D.   On: 02/08/2020 15:03    EKG: Independently reviewed.  Sinus rhythm with inferior Q waves  Assessment/Plan Active Problems:   * No active hospital problems. *    1.  Change in mental status-likely due to polysubstance abuse.  He is on high doses of Xanax and morphine and also other medications such trazodone and tricyclic's. At this time we will hold everything overnight, until she is more arousable Check blood cultures to rule out infection is afebrile and no leukocytosis UA negative Place on  Tele Ct head negative for acute .  2.  Hypotension-Etiology unclear. Certainly could be medication related, po intake, ? Improved with IV fluid Continue monitoring and if need to use gentle hydration R/o infection-doubtful as she is afebrile,  no leukocytosis.   3. Hx/o cad- hold metoprolol, imdur and lisinopril due to hypotension  4.  Chronic back pain- on pain meds. Hold for now due to #1.  5. Hypoxic- on presentation, requiring 2L as her 02 sat was found to be 86%. Likely has underlying OSA especially that she is somnolent, snoring on exam.  Will wean off o2 once more arounsable.  6.Diabetes on insulin- hold home dose insulin as she is not eating right now, ivf, and riss, ck fs.    DVT prophylaxis: lovenox Code Status: full, has been DNR during last admission, but since pt is not arousable will keep her full until can discuss this in am  Family Communication: none at bedside  Disposition Plan: d/c back home when medically stable  Consults called: none  Admission status: observation as she only requires <2 MN stay     Lynn Ito MD [facility] Pager [phone]   If 7PM-7AM, please contact night-coverage [email or web address] Password [facility]  02/08/2020, 6:04 PM

## 2020-02-08 NOTE — ED Notes (Signed)
This RN called lab and requested that someone come draw blood cultures for this pt.

## 2020-02-09 DIAGNOSIS — I952 Hypotension due to drugs: Secondary | ICD-10-CM

## 2020-02-09 DIAGNOSIS — I2510 Atherosclerotic heart disease of native coronary artery without angina pectoris: Secondary | ICD-10-CM

## 2020-02-09 DIAGNOSIS — R400 Somnolence: Secondary | ICD-10-CM | POA: Diagnosis not present

## 2020-02-09 DIAGNOSIS — M549 Dorsalgia, unspecified: Secondary | ICD-10-CM | POA: Diagnosis not present

## 2020-02-09 LAB — GLUCOSE, CAPILLARY: Glucose-Capillary: 203 mg/dL — ABNORMAL HIGH (ref 70–99)

## 2020-02-09 LAB — BRAIN NATRIURETIC PEPTIDE: B Natriuretic Peptide: 189 pg/mL — ABNORMAL HIGH (ref 0.0–100.0)

## 2020-02-09 LAB — SARS CORONAVIRUS 2 (TAT 6-24 HRS): SARS Coronavirus 2: NEGATIVE

## 2020-02-09 MED ORDER — METOPROLOL TARTRATE 25 MG PO TABS: 12.5000 mg | ORAL_TABLET | Freq: Two times a day (BID) | ORAL | 0 refills | Status: DC

## 2020-02-09 MED ORDER — ACETAMINOPHEN 325 MG PO TABS
650.0000 mg | ORAL_TABLET | Freq: Four times a day (QID) | ORAL | Status: DC | PRN
  Administered 2020-02-09: 650 mg via ORAL
  Filled 2020-02-09: qty 2

## 2020-02-09 MED ORDER — TRAZODONE HCL 100 MG PO TABS: 100.0000 mg | ORAL_TABLET | Freq: Every day | ORAL | Status: DC

## 2020-02-09 MED ORDER — MORPHINE SULFATE 15 MG PO TABS: 15.0000 mg | ORAL_TABLET | Freq: Two times a day (BID) | ORAL | Status: DC

## 2020-02-09 MED ORDER — ALPRAZOLAM 1 MG PO TABS: 1.0000 mg | ORAL_TABLET | Freq: Two times a day (BID) | ORAL | 0 refills | Status: DC | PRN

## 2020-02-09 MED ORDER — MORPHINE SULFATE 15 MG PO TABS
15.0000 mg | ORAL_TABLET | Freq: Once | ORAL | Status: AC
  Administered 2020-02-09: 15 mg via ORAL
  Filled 2020-02-09: qty 1

## 2020-02-09 MED ORDER — ALPRAZOLAM 0.5 MG PO TABS
0.5000 mg | ORAL_TABLET | Freq: Once | ORAL | Status: AC
  Administered 2020-02-09: 0.5 mg via ORAL
  Filled 2020-02-09: qty 1

## 2020-02-09 MED ORDER — MORPHINE SULFATE (PF) 2 MG/ML IV SOLN
2.0000 mg | Freq: Once | INTRAVENOUS | Status: AC
  Administered 2020-02-09: 2 mg via INTRAVENOUS
  Filled 2020-02-09: qty 1

## 2020-02-09 MED ORDER — FUROSEMIDE 20 MG PO TABS
10.0000 mg | ORAL_TABLET | Freq: Once | ORAL | Status: AC
  Administered 2020-02-09: 10 mg via ORAL
  Filled 2020-02-09: qty 1

## 2020-02-09 NOTE — Consult Note (Signed)
WOC Nurse Consult Note: Reason for Consult:Healing full thickness wounds.  Patient is seen on an outpatient basis by Dr. Orland Jarred. She cares for the wounds at home in between his visits. Wound type:Trauma Pressure Injury POA: NA Measurement:RIght 3rd digit" 1.5cm x 1cm x 0.1cm Left great toe:  0.4cm x 1.2cm x 0.1cm Wound HOY:WVXU, moist Drainage (amount, consistency, odor) small amount serous Periwound:intact Dressing procedure/placement/frequency: I will provide Nursing with guidance for topical care using xeroform and dry dressings topped with ACE bandaging. Patient already has her next appointment to see Dr. Orland Jarred.  WOC nursing team will not follow, but will remain available to this patient, the nursing and medical teams.  Please re-consult if needed. Thanks, Ladona Mow, MSN, RN, GNP, [place]  Pager# [phone]

## 2020-02-09 NOTE — Progress Notes (Signed)
[name] to be D/C'd Home per MD order. Patient given discharge teaching and paperwork regarding medications, diet, follow-up appointments and activity. Patient understanding verbalized. No questions or complaints at this time. Skin condition as charted. IV and telemetry removed prior to leaving.  No further needs by Care Management/Social Work.  An After Visit Summary was printed and given to the patient.   Patient escorted via wheelchair to ride home with family/friend.  Clayborne Dana

## 2020-02-09 NOTE — Discharge Summary (Signed)
Jessica DanceBarbara Bathe WUJ:[id number] DOB: 05/17/1946 DOA: 02/08/2020  PCP: Garlon HatchetMcConville, Robert, MD  Admit date: 02/08/2020 Discharge date: 02/09/2020  Admitted From: Home Disposition: Home  Recommendations for Outpatient Follow-up:  1. Follow up with PCP in 1 week 2. Please obtain BMP/CBC in one week 3. Please follow up on the following pending results: Blood culture 4. Follow-up with Dr. Mariah MillingGollan 1 week     Discharge Condition:Stable CODE STATUS: Full Diet recommendation: Heart Healthy / Carb Modified  Brief/Interim Summary: Jessica DanceBarbara Donovan is a 74 y.o. female with medical history significant  for diabetes, hypertension and chronic back pain, coronary artery disease was brought in when home health nurse noticed patient was lethargic and unarousable.  Per EMS Per ER note patient had a fall.  History is very limited as patient is very somnolent barely responding other than a moan to deep sternal rub.  History was taken from ER attendings documentation.  Apparently at baseline patient is alert and oriented x4 and when EMS was able to wake her she was able to answer questions appropriately but quickly went back to sleep.  She was noted to be borderline hypotensive by EMS as well as having O2 sats of 86 on room air.In the ED patient was found with blood pressure of 90/59 and a repeat at 84/46.  Was given fluid bolus and was placed on 2 L nasal cannula and she her O2 saturation was 86% on room air which improved to 95%.  Per EMS patient had complained of low back pain and generalized weakness.  Her blood glucose level was 165. While MD was doing deep sternal rub the telemetry monitor showed "NSVT", but it did not appear as such , more with artifact. In the ED she was given a dose of Narcan without any change.  CT of the head was negative for acute process.   Urine drug screen was positive for tricyclic, benzodiazepine, and opiates Overnight she was monitored on telemetry without any events.  All her pain  medications and Xanax were held.  This a.m. she is awake and alert.  She was given low-dose Lasix p.o. x1 mild interstitial edema on chest x-ray.  She is weaned off of oxygen and satting room air around 93%.  She was counseled on taking too much medication is Xanax and needs to follow-up with her primary care to discuss this.  I have also updated her cardiologist Dr. Mariah MillingGollan about patient's status and she will need to follow-up with him as outpatient.  Discharge Diagnoses:  Active Problems:   Change in mental status    Discharge Instructions  Discharge Instructions    Call MD for:  difficulty breathing, headache or visual disturbances   Complete by: As directed    Diet - low sodium heart healthy   Complete by: As directed    Discharge instructions   Complete by: As directed    Dr. Mariah MillingGollan cardiology in one week.  F/u with pcp in one week   Increase activity slowly   Complete by: As directed      Allergies as of 02/09/2020      Reactions   Ampicillin Nausea And Vomiting, Other (See Comments)   Did it involve swelling of the face/tongue/throat, SOB, or low BP? Yes Did it involve sudden or severe rash/hives, skin peeling, or any reaction on the inside of your mouth or nose? No Did you need to seek medical attention at a hospital or doctor's office? No When did it last happen?>10 years If all above  answers are "NO", may proceed with cephalosporin use.   Bactrim [sulfamethoxazole-trimethoprim] Other (See Comments)   Reaction: unknown   Codeine Nausea And Vomiting   Flu Virus Vaccine Other (See Comments)   Reaction: Passed out for 12 days   Influenza Vaccines Other (See Comments)   Reaction:  Caused pt to pass out    Methadone Hives, Itching   Oxycodone-acetaminophen Nausea And Vomiting   Percocet [oxycodone-acetaminophen] Nausea And Vomiting   Sulfa Antibiotics Other (See Comments)   Reaction: unknown   Tetanus Antitoxin Swelling, Other (See Comments)   Reaction: injection site  swelling   Tetanus Toxoids Swelling, Other (See Comments)   Reaction:  Swelling at injection site   Penicillin G Rash   Has patient had a PCN reaction causing immediate rash, facial/tongue/throat swelling, SOB or lightheadedness with hypotension: Yes Has patient had a PCN reaction causing severe rash involving mucus membranes or skin necrosis: No Has patient had a PCN reaction that required hospitalization: No Has patient had a PCN reaction occurring within the last 10 years: No If all of the above answers are "NO", then may proceed with Cephalosporin use..   Tetracyclines & Related Rash      Medication List    STOP taking these medications   celecoxib 100 MG capsule Commonly known as: CELEBREX   doxycycline 100 MG tablet Commonly known as: VIBRA-TABS   isosorbide mononitrate 30 MG 24 hr tablet Commonly known as: IMDUR   lisinopril 20 MG tablet Commonly known as: ZESTRIL   traZODone 100 MG tablet Commonly known as: DESYREL     TAKE these medications   acetaminophen 500 MG tablet Commonly known as: TYLENOL Take 1,000 mg by mouth every 8 (eight) hours as needed for mild pain or headache.   ALPRAZolam 1 MG tablet Commonly known as: XANAX Take 1 tablet (1 mg total) by mouth 2 (two) times daily as needed for anxiety. What changed:   when to take this  reasons to take this   AMBULATORY NON FORMULARY MEDICATION Compounded estrogen  Apply 1 gram vaginally M,W, Friday [facility]   aspirin 81 MG tablet Take 81 mg by mouth daily.   atorvastatin 20 MG tablet Commonly known as: LIPITOR Take 20 mg by mouth daily.   diphenoxylate-atropine 2.5-0.025 MG tablet Commonly known as: LOMOTIL Take 1 tablet by mouth every 4 (four) hours as needed for diarrhea or loose stools.   ferrous sulfate 325 (65 FE) MG tablet Take 325 mg by mouth daily with breakfast.   insulin aspart 100 UNIT/ML injection Commonly known as: novoLOG Inject 3-15 Units into the skin 3 (three)  times daily with meals as needed for high blood sugar. Pt uses as needed per sliding scale:    Less than 140:  0 units  140-180:  3 units 181-220:  4 units 221- 260:  6 units 261- 320:  8 units 321-360:  10 units 361-400:  12 units Greater than 400:  15 units   metoprolol tartrate 25 MG tablet Commonly known as: LOPRESSOR Take 0.5 tablets (12.5 mg total) by mouth 2 (two) times daily. What changed: how much to take   mirtazapine 15 MG tablet Commonly known as: REMERON Take 15 mg by mouth at bedtime.   morphine 15 MG tablet Commonly known as: MSIR Take 1 tablet (15 mg total) by mouth 2 (two) times daily. What changed: when to take this   omeprazole 20 MG capsule Commonly known as: PRILOSEC Take 20 mg by mouth daily.   Toujeo  SoloStar 300 UNIT/ML Sopn Generic drug: Insulin Glargine (1 Unit Dial) Inject 50 Units into the skin daily.   venlafaxine XR 75 MG 24 hr capsule Commonly known as: EFFEXOR-XR Take 75 mg by mouth 3 (three) times daily.   Vitamin D (Ergocalciferol) 1.25 MG (50000 UNIT) Caps capsule Commonly known as: DRISDOL Take 50,000 Units by mouth every Sunday.      Follow-up Information    Antonieta Iba, MD Follow up in 1 week(s).   Specialty: Cardiology Contact information: [address] [place] [address] [phone]        Garlon Hatchet, MD Follow up in 1 week(s).   Specialty: Family Medicine         Allergies  Allergen Reactions  . Ampicillin Nausea And Vomiting and Other (See Comments)    Did it involve swelling of the face/tongue/throat, SOB, or low BP? Yes Did it involve sudden or severe rash/hives, skin peeling, or any reaction on the inside of your mouth or nose? No Did you need to seek medical attention at a hospital or doctor's office? No When did it last happen?>10 years If all above answers are "NO", may proceed with cephalosporin use.   . Bactrim [Sulfamethoxazole-Trimethoprim] Other (See Comments)     Reaction: unknown  . Codeine Nausea And Vomiting  . Flu Virus Vaccine Other (See Comments)    Reaction: Passed out for 12 days  . Influenza Vaccines Other (See Comments)    Reaction:  Caused pt to pass out   . Methadone Hives and Itching  . Oxycodone-Acetaminophen Nausea And Vomiting  . Percocet [Oxycodone-Acetaminophen] Nausea And Vomiting  . Sulfa Antibiotics Other (See Comments)    Reaction: unknown  . Tetanus Antitoxin Swelling and Other (See Comments)    Reaction: injection site swelling  . Tetanus Toxoids Swelling and Other (See Comments)    Reaction:  Swelling at injection site  . Penicillin G Rash    Has patient had a PCN reaction causing immediate rash, facial/tongue/throat swelling, SOB or lightheadedness with hypotension: Yes Has patient had a PCN reaction causing severe rash involving mucus membranes or skin necrosis: No Has patient had a PCN reaction that required hospitalization: No Has patient had a PCN reaction occurring within the last 10 years: No If all of the above answers are "NO", then may proceed with Cephalosporin use..  . Tetracyclines & Related Rash    Consultations:  None   Procedures/Studies: CT Head Wo Contrast  Result Date: 02/08/2020 CLINICAL DATA:  Altered mental status following a fall. EXAM: CT HEAD WITHOUT CONTRAST TECHNIQUE: Contiguous axial images were obtained from the base of the skull through the vertex without intravenous contrast. COMPARISON:  10/20/2019 FINDINGS: Brain: Stable mildly enlarged ventricles and cortical sulci. Stable minimal patchy white matter low density in both cerebral hemispheres. No intracranial hemorrhage, mass lesion or CT evidence of acute infarction. Vascular: No hyperdense vessel or unexpected calcification. Skull: Normal. Negative for fracture or focal lesion. Sinuses/Orbits: Status post bilateral cataract extraction. Unremarkable bones and included paranasal sinuses. Other: None. IMPRESSION: 1. No acute abnormality.  2. Stable mild atrophy and minimal chronic small vessel white matter ischemic changes. Electronically Signed   By: Beckie Salts M.D.   On: 02/08/2020 15:15   DG Chest Portable 1 View  Result Date: 02/08/2020 CLINICAL DATA:  Low back pain and generalized weakness following a fall. Shortness of breath. EXAM: PORTABLE CHEST 1 VIEW COMPARISON:  05/10/2019 FINDINGS: Poor inspiration with diffuse crowding of the lung markings. Interval  diffuse increase in prominence of the interstitial markings with less prominent pulmonary vasculature. No pleural fluid or airspace consolidation. Normal sized heart. Thoracic spine and bilateral shoulder degenerative changes. IMPRESSION: Interval mild interstitial pulmonary edema or pneumonitis with a poor inspiration resulting in diffuse crowding of the lung markings. Electronically Signed   By: Claudie Revering M.D.   On: 02/08/2020 15:03       Subjective: Patient has no new complaints.  Denies shortness of breath, dizziness, lightheadedness or chest pain.  Discharge Exam: Vitals:   02/09/20 1009 02/09/20 1056  BP:  131/69  Pulse:  81  Resp:    Temp:    SpO2: 94% 93%   Vitals:   02/09/20 0346 02/09/20 0828 02/09/20 1009 02/09/20 1056  BP: (!) 124/54 (!) 108/47  131/69  Pulse: 82 74  81  Resp: 19 18    Temp: 99.1 F (37.3 C) 98.4 F (36.9 C)    TempSrc: Oral Oral    SpO2: 96% 94% 94% 93%  Weight:      Height:        General: Pt is alert, awake, not in acute distress Cardiovascular: RRR, S1/S2 +, no rubs, no gallops Respiratory: CTA bilaterally, no wheezing, no rhonchi Abdominal: Soft, NT, ND, bowel sounds + Extremities: no edema, no cyanosis    The results of significant diagnostics from this hospitalization (including imaging, microbiology, ancillary and laboratory) are listed below for reference.     Microbiology: Recent Results (from the past 240 hour(s))  SARS CORONAVIRUS 2 (TAT 6-24 HRS) Nasopharyngeal Nasopharyngeal Swab     Status: None    Collection Time: 02/08/20  4:57 PM   Specimen: Nasopharyngeal Swab  Result Value Ref Range Status   SARS Coronavirus 2 NEGATIVE NEGATIVE Final    Comment: (NOTE) SARS-CoV-2 target nucleic acids are NOT DETECTED. The SARS-CoV-2 RNA is generally detectable in upper and lower respiratory specimens during the acute phase of infection. Negative results do not preclude SARS-CoV-2 infection, do not rule out co-infections with other pathogens, and should not be used as the sole basis for treatment or other patient management decisions. Negative results must be combined with clinical observations, patient history, and epidemiological information. The expected result is Negative. Fact Sheet for Patients: SugarRoll.be Fact Sheet for Healthcare Providers: https://www.woods-mathews.com/ This test is not yet approved or cleared by the [place] FDA and  has been authorized for detection and/or diagnosis of SARS-CoV-2 by FDA under an Emergency Use Authorization (EUA). This EUA will remain  in effect (meaning this test can be used) for the duration of the COVID-19 declaration under Section 56 4(b)(1) of the Act, 21 U.S.C. section 360bbb-3(b)(1), unless the authorization is terminated or revoked sooner. Performed at [facility], [place] [address], [place], [place] [address]   Blood culture (routine x 2)     Status: None (Preliminary result)   Collection Time: 02/08/20 10:29 PM   Specimen: BLOOD  Result Value Ref Range Status   Specimen Description BLOOD BLOOD LEFT ARM  Final   Special Requests   Final    BOTTLES DRAWN AEROBIC AND ANAEROBIC Blood Culture results may not be optimal due to an excessive volume of blood received in culture bottles   Culture   Final    NO GROWTH < 12 HOURS Performed at [facility], [address] [place], [place] [address]    Report Status PENDING  Incomplete  Blood culture (routine x 2)     Status: None  (Preliminary result)  Collection Time: 02/08/20 10:40 PM   Specimen: BLOOD  Result Value Ref Range Status   Specimen Description BLOOD BLOOD LEFT HAND  Final   Special Requests   Final    BOTTLES DRAWN AEROBIC ONLY Blood Culture results may not be optimal due to an inadequate volume of blood received in culture bottles   Culture   Final    NO GROWTH < 12 HOURS Performed at [facility], [address], [place], [place] [address]    Report Status PENDING  Incomplete     Labs: BNP (last 3 results) Recent Labs    02/09/20 0749  BNP 189.0*   Basic Metabolic Panel: Recent Labs  Lab 02/08/20 1518  NA 139  K 4.9  CL 102  CO2 30  GLUCOSE 145*  BUN 28*  CREATININE 0.99  CALCIUM 9.2   Liver Function Tests: Recent Labs  Lab 02/08/20 1518  AST 106*  ALT 38  ALKPHOS 90  BILITOT 0.4  PROT 6.5  ALBUMIN 3.5   No results for input(s): LIPASE, AMYLASE in the last 168 hours. No results for input(s): AMMONIA in the last 168 hours. CBC: Recent Labs  Lab 02/08/20 1518  WBC 7.0  NEUTROABS 3.4  HGB 12.9  HCT 41.8  MCV 103.5*  PLT 245   Cardiac Enzymes: No results for input(s): CKTOTAL, CKMB, CKMBINDEX, TROPONINI in the last 168 hours. BNP: Invalid input(s): POCBNP CBG: Recent Labs  Lab 02/08/20 2356 02/09/20 0350  GLUCAP 221* 203*   D-Dimer No results for input(s): DDIMER in the last 72 hours. Hgb A1c No results for input(s): HGBA1C in the last 72 hours. Lipid Profile No results for input(s): CHOL, HDL, LDLCALC, TRIG, CHOLHDL, LDLDIRECT in the last 72 hours. Thyroid function studies No results for input(s): TSH, T4TOTAL, T3FREE, THYROIDAB in the last 72 hours.  Invalid input(s): FREET3 Anemia work up No results for input(s): VITAMINB12, FOLATE, FERRITIN, TIBC, IRON, RETICCTPCT in the last 72 hours. Urinalysis    Component Value Date/Time   COLORURINE YELLOW (A) 02/08/2020 1637   APPEARANCEUR CLEAR (A) 02/08/2020 1637   APPEARANCEUR Cloudy (A)  11/29/2018 1518   LABSPEC 1.010 02/08/2020 1637   LABSPEC 1.014 12/15/2014 0958   PHURINE 5.0 02/08/2020 1637   GLUCOSEU NEGATIVE 02/08/2020 1637   GLUCOSEU Negative 12/15/2014 0958   HGBUR NEGATIVE 02/08/2020 1637   BILIRUBINUR NEGATIVE 02/08/2020 1637   BILIRUBINUR Negative 11/29/2018 1518   BILIRUBINUR Negative 12/15/2014 0958   KETONESUR NEGATIVE 02/08/2020 1637   PROTEINUR NEGATIVE 02/08/2020 1637   NITRITE NEGATIVE 02/08/2020 1637   LEUKOCYTESUR NEGATIVE 02/08/2020 1637   LEUKOCYTESUR 1+ 12/15/2014 0958   Sepsis Labs Invalid input(s): PROCALCITONIN,  WBC,  LACTICIDVEN Microbiology Recent Results (from the past 240 hour(s))  SARS CORONAVIRUS 2 (TAT 6-24 HRS) Nasopharyngeal Nasopharyngeal Swab     Status: None   Collection Time: 02/08/20  4:57 PM   Specimen: Nasopharyngeal Swab  Result Value Ref Range Status   SARS Coronavirus 2 NEGATIVE NEGATIVE Final    Comment: (NOTE) SARS-CoV-2 target nucleic acids are NOT DETECTED. The SARS-CoV-2 RNA is generally detectable in upper and lower respiratory specimens during the acute phase of infection. Negative results do not preclude SARS-CoV-2 infection, do not rule out co-infections with other pathogens, and should not be used as the sole basis for treatment or other patient management decisions. Negative results must be combined with clinical observations, patient history, and epidemiological information. The expected result is Negative. Fact Sheet for Patients: HairSlick.no Fact Sheet for Healthcare Providers:  quierodirigir.com This test is not yet approved or cleared by the [place] and  has been authorized for detection and/or diagnosis of SARS-CoV-2 by FDA under an Emergency Use Authorization (EUA). This EUA will remain  in effect (meaning this test can be used) for the duration of the COVID-19 declaration under Section 56 4(b)(1) of the Act, 21 U.S.C. section  360bbb-3(b)(1), unless the authorization is terminated or revoked sooner. Performed at [facility], [address], [place], [place] [address]   Blood culture (routine x 2)     Status: None (Preliminary result)   Collection Time: 02/08/20 10:29 PM   Specimen: BLOOD  Result Value Ref Range Status   Specimen Description BLOOD BLOOD LEFT ARM  Final   Special Requests   Final    BOTTLES DRAWN AEROBIC AND ANAEROBIC Blood Culture results may not be optimal due to an excessive volume of blood received in culture bottles   Culture   Final    NO GROWTH < 12 HOURS Performed at [facility], [address], [place], [place] [address]    Report Status PENDING  Incomplete  Blood culture (routine x 2)     Status: None (Preliminary result)   Collection Time: 02/08/20 10:40 PM   Specimen: BLOOD  Result Value Ref Range Status   Specimen Description BLOOD BLOOD LEFT HAND  Final   Special Requests   Final    BOTTLES DRAWN AEROBIC ONLY Blood Culture results may not be optimal due to an inadequate volume of blood received in culture bottles   Culture   Final    NO GROWTH < 12 HOURS Performed at [facility], [address] [place], [place] [address]    Report Status PENDING  Incomplete     Time coordinating discharge: Over 30 minutes  SIGNED:   Lynn Ito, MD  [facility] 02/09/2020, 2:23 PM Pager   If 7PM-7AM, please contact night-coverage [email or web address] Password [email or web address]

## 2020-02-13 LAB — CULTURE, BLOOD (ROUTINE X 2)
Culture: NO GROWTH
Culture: NO GROWTH

## 2020-02-16 ENCOUNTER — Ambulatory Visit: Payer: Medicare Other | Admitting: Family

## 2020-02-25 ENCOUNTER — Other Ambulatory Visit: Payer: Self-pay

## 2020-02-25 ENCOUNTER — Emergency Department: Payer: Medicare Other

## 2020-02-25 ENCOUNTER — Emergency Department
Admission: EM | Admit: 2020-02-25 | Discharge: 2020-02-26 | Disposition: A | Discharge status: Home / Self Care | Payer: Medicare Other | Attending: Emergency Medicine | Admitting: Emergency Medicine

## 2020-02-25 DIAGNOSIS — Z89412 Acquired absence of left great toe: Secondary | ICD-10-CM | POA: Diagnosis not present

## 2020-02-25 DIAGNOSIS — Y929 Unspecified place or not applicable: Secondary | ICD-10-CM | POA: Diagnosis not present

## 2020-02-25 DIAGNOSIS — F0390 Unspecified dementia without behavioral disturbance: Secondary | ICD-10-CM | POA: Insufficient documentation

## 2020-02-25 DIAGNOSIS — Y939 Activity, unspecified: Secondary | ICD-10-CM | POA: Insufficient documentation

## 2020-02-25 DIAGNOSIS — R5383 Other fatigue: Secondary | ICD-10-CM | POA: Diagnosis not present

## 2020-02-25 DIAGNOSIS — S32009A Unspecified fracture of unspecified lumbar vertebra, initial encounter for closed fracture: Secondary | ICD-10-CM | POA: Diagnosis not present

## 2020-02-25 DIAGNOSIS — Z7982 Long term (current) use of aspirin: Secondary | ICD-10-CM | POA: Diagnosis not present

## 2020-02-25 DIAGNOSIS — Z794 Long term (current) use of insulin: Secondary | ICD-10-CM | POA: Diagnosis not present

## 2020-02-25 DIAGNOSIS — I10 Essential (primary) hypertension: Secondary | ICD-10-CM | POA: Diagnosis not present

## 2020-02-25 DIAGNOSIS — W19XXXA Unspecified fall, initial encounter: Secondary | ICD-10-CM | POA: Insufficient documentation

## 2020-02-25 DIAGNOSIS — Y999 Unspecified external cause status: Secondary | ICD-10-CM | POA: Insufficient documentation

## 2020-02-25 DIAGNOSIS — Z955 Presence of coronary angioplasty implant and graft: Secondary | ICD-10-CM | POA: Insufficient documentation

## 2020-02-25 DIAGNOSIS — M542 Cervicalgia: Secondary | ICD-10-CM | POA: Diagnosis not present

## 2020-02-25 DIAGNOSIS — Z89421 Acquired absence of other right toe(s): Secondary | ICD-10-CM | POA: Insufficient documentation

## 2020-02-25 DIAGNOSIS — R4182 Altered mental status, unspecified: Secondary | ICD-10-CM

## 2020-02-25 DIAGNOSIS — E119 Type 2 diabetes mellitus without complications: Secondary | ICD-10-CM | POA: Diagnosis not present

## 2020-02-25 DIAGNOSIS — R55 Syncope and collapse: Secondary | ICD-10-CM | POA: Diagnosis not present

## 2020-02-25 DIAGNOSIS — S3992XA Unspecified injury of lower back, initial encounter: Secondary | ICD-10-CM | POA: Diagnosis present

## 2020-02-25 DIAGNOSIS — I2510 Atherosclerotic heart disease of native coronary artery without angina pectoris: Secondary | ICD-10-CM | POA: Insufficient documentation

## 2020-02-25 LAB — CBC
HCT: 44.7 % (ref 36.0–46.0)
Hemoglobin: 14.4 g/dL (ref 12.0–15.0)
MCH: 32.1 pg (ref 26.0–34.0)
MCHC: 32.2 g/dL (ref 30.0–36.0)
MCV: 99.6 fL (ref 80.0–100.0)
Platelets: 222 10*3/uL (ref 150–400)
RBC: 4.49 MIL/uL (ref 3.87–5.11)
RDW: 12.3 % (ref 11.5–15.5)
WBC: 8.1 10*3/uL (ref 4.0–10.5)
nRBC: 0 % (ref 0.0–0.2)

## 2020-02-25 LAB — URINALYSIS, COMPLETE (UACMP) WITH MICROSCOPIC
Bilirubin Urine: NEGATIVE
Glucose, UA: 150 mg/dL — AB
Hgb urine dipstick: NEGATIVE
Ketones, ur: NEGATIVE mg/dL
Leukocytes,Ua: NEGATIVE
Nitrite: POSITIVE — AB
Protein, ur: NEGATIVE mg/dL
Specific Gravity, Urine: 1.009 (ref 1.005–1.030)
pH: 6 (ref 5.0–8.0)

## 2020-02-25 LAB — BASIC METABOLIC PANEL
Anion gap: 8 (ref 5–15)
BUN: 16 mg/dL (ref 8–23)
CO2: 30 mmol/L (ref 22–32)
Calcium: 9.7 mg/dL (ref 8.9–10.3)
Chloride: 99 mmol/L (ref 98–111)
Creatinine, Ser: 0.6 mg/dL (ref 0.44–1.00)
GFR calc Af Amer: >60 mL/min (ref >60)
GFR calc non Af Amer: >60 mL/min (ref >60)
Glucose, Bld: 271 mg/dL — ABNORMAL HIGH (ref 70–99)
Potassium: 4.2 mmol/L (ref 3.5–5.1)
Sodium: 137 mmol/L (ref 135–145)

## 2020-02-25 LAB — HEPATIC FUNCTION PANEL
ALT: 14 U/L (ref 0–44)
AST: 27 U/L (ref 15–41)
Albumin: 3.8 g/dL (ref 3.5–5.0)
Alkaline Phosphatase: 72 U/L (ref 38–126)
Bilirubin, Direct: 0.1 mg/dL (ref 0.0–0.2)
Indirect Bilirubin: 0.4 mg/dL (ref 0.3–0.9)
Total Bilirubin: 0.5 mg/dL (ref 0.3–1.2)
Total Protein: 6.7 g/dL (ref 6.5–8.1)

## 2020-02-25 LAB — URINE DRUG SCREEN, QUALITATIVE (ARMC ONLY)
Amphetamines, Ur Screen: NOT DETECTED
Barbiturates, Ur Screen: NOT DETECTED
Benzodiazepine, Ur Scrn: POSITIVE — AB
Cannabinoid 50 Ng, Ur ~~LOC~~: NOT DETECTED
Cocaine Metabolite,Ur ~~LOC~~: NOT DETECTED
MDMA (Ecstasy)Ur Screen: NOT DETECTED
Methadone Scn, Ur: NOT DETECTED
Opiate, Ur Screen: POSITIVE — AB
Phencyclidine (PCP) Ur S: NOT DETECTED
Tricyclic, Ur Screen: POSITIVE — AB

## 2020-02-25 LAB — LACTIC ACID, PLASMA: Lactic Acid, Venous: 1.7 mmol/L (ref 0.5–1.9)

## 2020-02-25 LAB — BRAIN NATRIURETIC PEPTIDE: B Natriuretic Peptide: 198 pg/mL — ABNORMAL HIGH (ref 0.0–100.0)

## 2020-02-25 LAB — TROPONIN I (HIGH SENSITIVITY)
Troponin I (High Sensitivity): 9 ng/L (ref <18)
Troponin I (High Sensitivity): 8 ng/L (ref <18)

## 2020-02-25 MED ORDER — ACETAMINOPHEN 500 MG PO TABS
1000.0000 mg | ORAL_TABLET | Freq: Once | ORAL | Status: AC
  Administered 2020-02-25: 1000 mg via ORAL
  Filled 2020-02-25: qty 2

## 2020-02-25 NOTE — ED Notes (Signed)
Pt ambulatory to toilet with RN assistance. PT steady on feet, alert and using bathroom at this time.

## 2020-02-25 NOTE — ED Provider Notes (Signed)
[facility] Emergency Department Provider Note ____________________________________________   First MD Initiated Contact with Patient 02/25/20 1117     (approximate)  I have reviewed the triage vital signs and the nursing notes.   HISTORY  Chief Complaint Fall    HPI Jessica Donovan is a 74 y.o. female with PMH as noted below who presents with falls and weakness.  The patient states that she had 2 episodes today in which she passed out and fell.  The patient denies any prodrome of lightheadedness or weakness prior to the falls.  She reports pain to the back of her head and to her back.  She states that she is on pain medication chronically, but denies taking it today.  EMS reports that they were called out once and the patient declined transportation to the hospital.  Apparently the patient's neighbors were concerned that she may be oversedated from her medications.   Past Medical History:  Diagnosis Date  . Anxiety   . Chronic back pain   . Collagen vascular disease (HCC)   . Coronary artery disease    a. s/p PCI/DES to dRCA & mLCx in 2014 with repeat LHC in 10/2015 showing patent stents  . Diabetes mellitus without complication (HCC)   . Hypertension   . Low back pain   . Myocardial infarction (HCC)   . Osteomyelitis of toe (HCC) 01/23/2016  . Stroke [facility])     Patient Active Problem List   Diagnosis Date Noted  . Change in mental status 02/08/2020  . Pressure injury of skin 10/23/2019  . Fall   . HTN (hypertension) 10/20/2019  . Right foot infection 12/19/2018  . Acute encephalopathy 07/06/2018  . Anxiety 06/10/2017  . Chest pain with moderate risk for cardiac etiology 06/09/2017  . UTI (urinary tract infection) 05/19/2017  . CAD (coronary artery disease) 05/19/2017  . Chronic narcotic use 05/19/2017  . Cellulitis in diabetic foot (HCC) 05/19/2017  . Opioid overdose (HCC) 02/23/2017  . Altered mental status 02/23/2017  . Acute colitis  01/19/2017  . Sepsis (HCC) 01/17/2017  . Cellulitis of fourth toe of right foot 04/10/2016  . Somnolence 04/09/2016  . Dementia (HCC) 04/09/2016  . Cellulitis 04/03/2016  . Hematoma of right parietal scalp 02/23/2016  . Multiple falls 02/23/2016  . Physical debility 02/23/2016  . Type II diabetes mellitus, uncontrolled (HCC) 02/23/2016  . Syncope and collapse 02/23/2016  . Polypharmacy 02/23/2016  . Osteomyelitis of toe (HCC) 01/23/2016  . Rhabdomyolysis 12/07/2015  . ARF (acute renal failure) (HCC) 11/29/2015  . Hypotension 11/29/2015  . 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 Angina effort 11/07/2015  . Osteomyelitis (HCC) 11/07/2015  . Anemia due to chronic blood loss 06/14/2015  . Generalized anxiety disorder 06/14/2015  . Lumbago with sciatica 06/14/2015  . 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  . 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 Abnormal weight loss 05/18/2014  . Chronic pain syndrome 05/18/2014  . 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 Diabetic polyneuropathy (HCC) 05/18/2014  . Type 2 diabetes mellitus with diabetic neuropathy (HCC) 05/07/2014  . Hemorrhage of rectum and anus 01/17/2014  . Cellulitis and abscess of foot excluding toe 05/18/2012    Past Surgical History:  Procedure Laterality Date  . AMPUTATION TOE Left 11/10/2015   Procedure: AMPUTATION TOE;  Surgeon: Linus Galas, MD;  Location: [facility] ORS;  Service: Podiatry;  Laterality: Left;  . AMPUTATION  TOE Left 01/24/2016   Procedure: AMPUTATION TOE (2nd mpj);  Surgeon: Linus Galas, DPM;  Location: [facility] ORS;  Service: Podiatry;  Laterality: Left;  . AMPUTATION TOE Right 12/21/2018   Procedure: 2ND TOE AMPUTATION WITH DEBRIDEMENT OF SOFT TISSUE;  Surgeon: Recardo Evangelist, DPM;  Location: [facility] ORS;  Service: Podiatry;  Laterality: Right;  . CARDIAC CATHETERIZATION N/A  11/12/2015   Procedure: Left Heart Cath and Coronary Angiography;  Surgeon: Marcina Millard, MD;  Location: [facility] INVASIVE CV LAB;  Service: Cardiovascular;  Laterality: N/A;  . COLONOSCOPY WITH PROPOFOL N/A 07/20/2017   Procedure: COLONOSCOPY WITH PROPOFOL;  Surgeon: Wyline Mood, MD;  Location: [facility] ENDOSCOPY;  Service: Endoscopy;  Laterality: N/A;  . JOINT REPLACEMENT    . TOE AMPUTATION      Prior to Admission medications   Medication Sig Start Date End Date Taking? Authorizing Provider  acetaminophen (TYLENOL) 500 MG tablet Take 1,000 mg by mouth every 8 (eight) hours as needed for mild pain or headache.     [provider]  ALPRAZolam Prudy Feeler) 1 MG tablet Take 1 tablet (1 mg total) by mouth 2 (two) times daily as needed for anxiety. 02/09/20   Lynn Ito, MD  AMBULATORY NON FORMULARY MEDICATION Compounded estrogen  Apply 1 gram vaginally M,W, Friday [facility] 01/12/20   Vanna Scotland, MD  aspirin 81 MG tablet Take 81 mg by mouth daily.    [provider]  atorvastatin (LIPITOR) 20 MG tablet Take 20 mg by mouth daily.     [provider]  diphenoxylate-atropine (LOMOTIL) 2.5-0.025 MG tablet Take 1 tablet by mouth every 4 (four) hours as needed for diarrhea or loose stools.     [provider]  ferrous sulfate 325 (65 FE) MG tablet Take 325 mg by mouth daily with breakfast.    [provider]  insulin aspart (NOVOLOG) 100 UNIT/ML injection Inject 3-15 Units into the skin 3 (three) times daily with meals as needed for high blood sugar. Pt uses as needed per sliding scale:    Less than 140:  0 units  140-180:  3 units 181-220:  4 units 221- 260:  6 units 261- 320:  8 units 321-360:  10 units 361-400:  12 units Greater than 400:  15 units    [provider]  metoprolol tartrate (LOPRESSOR) 25 MG tablet Take 0.5 tablets (12.5 mg total) by mouth 2 (two) times daily. 02/09/20   Lynn Ito, MD  mirtazapine (REMERON) 15 MG  tablet Take 15 mg by mouth at bedtime.    [provider]  morphine (MSIR) 15 MG tablet Take 1 tablet (15 mg total) by mouth 2 (two) times daily. 02/09/20   Lynn Ito, MD  omeprazole (PRILOSEC) 20 MG capsule Take 20 mg by mouth daily.    [provider]  TOUJEO SOLOSTAR 300 UNIT/ML SOPN Inject 50 Units into the skin daily. 08/08/19   [provider]  venlafaxine XR (EFFEXOR-XR) 75 MG 24 hr capsule Take 75 mg by mouth 3 (three) times daily.    [provider]  Vitamin D, Ergocalciferol, (DRISDOL) 50000 units CAPS capsule Take 50,000 Units by mouth every Sunday.     [provider]    Allergies Ampicillin, Bactrim [sulfamethoxazole-trimethoprim], Codeine, Flu virus vaccine, Influenza vaccines, Methadone, Oxycodone-acetaminophen, Percocet [oxycodone-acetaminophen], Sulfa antibiotics, Tetanus antitoxin, Tetanus toxoids, Penicillin g, and Tetracyclines & related  Family History  Problem Relation Age of Onset  . CAD Mother   . CAD Father     Social History  Social History   Tobacco Use  . Smoking status: Never Smoker  . Smokeless tobacco: Never Used  Substance Use Topics  . Alcohol use: No  . Drug use: No    Review of Systems  Constitutional: No fever. Eyes: No redness. ENT: No sore throat.  Positive for neck pain. Cardiovascular: Denies chest pain. Respiratory: Denies shortness of breath. Gastrointestinal: No vomiting or diarrhea.  Genitourinary: Negative for dysuria.  Musculoskeletal: Negative for back pain. Skin: Negative for rash. Neurological: Negative for headache.   ____________________________________________   PHYSICAL EXAM:  VITAL SIGNS: ED Triage Vitals  Enc Vitals Group     BP 02/25/20 1109 (!) 160/73     Pulse Rate 02/25/20 1109 84     Resp 02/25/20 1109 18     Temp 02/25/20 1109 98.1 F (36.7 C)     Temp Source 02/25/20 1109 Oral     SpO2 02/25/20 1109 (!) 87 %     Weight 02/25/20 1107 178 lb (80.7 kg)      Height 02/25/20 1107 5\' 6"  (1.676 m)     Head Circumference --      Peak Flow --      Pain Score 02/25/20 1106 10     Pain Loc --      Pain Edu? --      Excl. in [place]? --     Constitutional: Slightly sleepy appearing, but awake and oriented. Well appearing and in no acute distress. Eyes: Conjunctivae are normal.  EOMI.  PERRLA. Head: Atraumatic. Nose: No congestion/rhinnorhea. Mouth/Throat: Mucous membranes are moist.   Neck: Normal range of motion.  Mild midline cervical spinal tenderness with no step-off or crepitus. Cardiovascular: Normal rate, regular rhythm. Grossly normal heart sounds.  Good peripheral circulation. Respiratory: Normal respiratory effort.  No retractions. Lungs CTAB. Gastrointestinal: Soft and nontender. No distention.  Genitourinary: No CVA tenderness. Musculoskeletal: No lower extremity edema.  Extremities warm and well perfused.  Good range of motion of bilateral hips.  Mild thoracic and lumbar midline tenderness with no step-off or crepitus. Neurologic:  Normal speech and language.  Motor intact in all extremities. Skin:  Skin is warm and dry. No rash noted. Psychiatric: Mood and affect are normal. Speech and behavior are normal.  ____________________________________________   LABS (all labs ordered are listed, but only abnormal results are displayed)  Labs Reviewed  BASIC METABOLIC PANEL - Abnormal; Notable for the following components:      Result Value   Glucose, Bld 271 (*)    All other components within normal limits  BRAIN NATRIURETIC PEPTIDE - Abnormal; Notable for the following components:   B Natriuretic Peptide 198.0 (*)    All other components within normal limits  CBC  LACTIC ACID, PLASMA  HEPATIC FUNCTION PANEL  URINALYSIS, COMPLETE (UACMP) WITH MICROSCOPIC  URINE DRUG SCREEN, QUALITATIVE ([facility] ONLY)  LACTIC ACID, PLASMA  TROPONIN I (HIGH SENSITIVITY)  TROPONIN I (HIGH SENSITIVITY)    ____________________________________________  EKG  ED ECG REPORT I, Arta Silence, the attending physician, personally viewed and interpreted this ECG.  Date: 02/25/2020 EKG Time: 1114 Rate: 83 Rhythm: normal sinus rhythm QRS Axis: normal Intervals: normal ST/T Wave abnormalities: normal Narrative Interpretation: no evidence of acute ischemia  ____________________________________________  RADIOLOGY  CT head: No ICH or other acute findings CT cervical spine: No acute fracture CT thoracic spine: No acute fracture CT lumbar spine: L3 and L4 spinous process fractures CXR: Bibasilar atelectasis.  No focal infiltrate. ____________________________________________   PROCEDURES  Procedure(s) performed: No  Procedures  Critical Care performed: No ____________________________________________   INITIAL IMPRESSION / ASSESSMENT AND PLAN / ED COURSE  Pertinent labs & imaging results that were available during my care of the patient were reviewed by me and considered in my medical decision making (see chart for details).  75 year old female with PMH as noted above presents with what the patient reports as 2 episodes of syncope with no clear prodrome.  Per EMS, the patient's neighbors were concerned that she was possibly oversedated due to some medications.  The patient reports pain to her neck and back after the falls.  I reviewed the past medical records in epic.  The patient was admitted last month when the home health aide noticed that she was lethargic and unarousable and had a fall.  Overall this presentation appears similar.  She was hypoxic on room air at that time.  She was alert the next day after her medications were held.  She is on Xanax and opiates for pain.  On exam today, the patient is a bit sleepy appearing but arousable.  Her vital signs are normal except for mild hypertension and her O2 saturation is in the high 80s on room air.  Neurologic exam is nonfocal.   She does have some mild midline spinal tenderness although no step-off or crepitus.  Overall I suspect most likely oversedation due to her medications, similar to the prior presentation.  However, I cannot rule out traumatic brain injury or intracranial hemorrhage and the patient's musculoskeletal exam is slightly unreliable given her mental status.  We will obtain CT of the head, spine, and a chest x-ray, lab work-up, and reassess.  ----------------------------------------- 3:09 PM on 02/25/2020 -----------------------------------------  The patient is a bit more somnolent, however she is maintaining a good spontaneous respiratory effort.  The imaging is negative except for L3 and L4 spinous process fractures.  I consulted Dr. Hyacinth Meeker from orthopedics who recommended that the patient get a corset type brace from the pharmacy and follow-up in 5 to 7 days, but did not require TLSO or other acute management.  Given the otherwise negative work-up, I continue to suspect that the patient's mental status is related to her medications.  We will continue to observe in the ED for an additional few hours.  If the patient continues to be altered and somnolent, she may require admission.  I signed her out to the oncoming physician Dr. Scotty Court. ____________________________________________   FINAL CLINICAL IMPRESSION(S) / ED DIAGNOSES  Final diagnoses:  Altered mental status, unspecified altered mental status type  Closed fracture of transverse process of lumbar vertebra, initial encounter (HCC)      NEW MEDICATIONS STARTED DURING THIS VISIT:  New Prescriptions   No medications on file     Note:  This document was prepared using Dragon voice recognition software and may include unintentional dictation errors.    Dionne Bucy, MD 02/25/20 1511

## 2020-02-25 NOTE — ED Notes (Signed)
Pt provided with meal tray.

## 2020-02-25 NOTE — ED Notes (Signed)
Pt in scans at this time. Will draw lactic when pt returns.

## 2020-02-25 NOTE — ED Notes (Signed)
ED Provider at bedside. 

## 2020-02-25 NOTE — ED Notes (Signed)
Pt assisted to bedside commode. [place] on feet, pt alert and oriented

## 2020-02-25 NOTE — ED Triage Notes (Signed)
Pt arrives [facility] from home for fall x 2 this AM. EMS was called out both times for lift assist. Pt refused first time. Neighbor found pt second time and informed EMS that she might be taking too many of her meds (takes 4 sedatives per EMS). Pt states she takes 15mg  morphine 3 times a day. EMS reports pt has home health come over twice weekly. Supposed to be using walker but EMS reports walker was not anywhere near pt when she fell. Second time she fell she hit back of head on floor. Pt playing with phone upon arrival. Pt answers questions appropriately but slowly. EMS reports that pt did not take meds this AM. 201/88, CBG 310, HR 84, 95% RA

## 2020-02-26 MED ORDER — TRAZODONE HCL 100 MG PO TABS
100.0000 mg | ORAL_TABLET | Freq: Every day | ORAL | Status: DC
  Administered 2020-02-26: 100 mg via ORAL
  Filled 2020-02-26: qty 1

## 2020-02-26 MED ORDER — DIPHENOXYLATE-ATROPINE 2.5-0.025 MG PO TABS: 1.0000 | ORAL_TABLET | ORAL | Status: DC | PRN

## 2020-02-26 MED ORDER — FERROUS SULFATE 325 (65 FE) MG PO TABS
325.0000 mg | ORAL_TABLET | Freq: Every day | ORAL | Status: DC
  Filled 2020-02-26 (×2): qty 1

## 2020-02-26 MED ORDER — INSULIN ASPART 100 UNIT/ML ~~LOC~~ SOLN: 3.0000 [IU] | Freq: Three times a day (TID) | SUBCUTANEOUS | Status: DC | PRN

## 2020-02-26 MED ORDER — VITAMIN D (ERGOCALCIFEROL) 1.25 MG (50000 UNIT) PO CAPS: 50000.0000 [IU] | ORAL_CAPSULE | ORAL | Status: DC

## 2020-02-26 MED ORDER — ATORVASTATIN CALCIUM 20 MG PO TABS
20.0000 mg | ORAL_TABLET | Freq: Every day | ORAL | Status: DC
  Administered 2020-02-26: 20 mg via ORAL
  Filled 2020-02-26: qty 1

## 2020-02-26 MED ORDER — MIRTAZAPINE 15 MG PO TABS
15.0000 mg | ORAL_TABLET | Freq: Every day | ORAL | Status: DC
  Administered 2020-02-26: 15 mg via ORAL
  Filled 2020-02-26: qty 1

## 2020-02-26 MED ORDER — OXYCODONE HCL 5 MG PO TABS
5.0000 mg | ORAL_TABLET | Freq: Four times a day (QID) | ORAL | Status: DC | PRN
  Administered 2020-02-26: 5 mg via ORAL
  Filled 2020-02-26: qty 1

## 2020-02-26 MED ORDER — PANTOPRAZOLE SODIUM 40 MG PO TBEC
40.0000 mg | DELAYED_RELEASE_TABLET | Freq: Every day | ORAL | Status: DC
  Administered 2020-02-26: 40 mg via ORAL
  Filled 2020-02-26: qty 1

## 2020-02-26 MED ORDER — CEPHALEXIN 500 MG PO CAPS
500.0000 mg | ORAL_CAPSULE | Freq: Two times a day (BID) | ORAL | Status: DC
  Administered 2020-02-26 (×2): 500 mg via ORAL
  Filled 2020-02-26 (×2): qty 1

## 2020-02-26 MED ORDER — ASPIRIN 81 MG PO CHEW
81.0000 mg | CHEWABLE_TABLET | Freq: Every day | ORAL | Status: DC
  Administered 2020-02-26: 81 mg via ORAL
  Filled 2020-02-26: qty 1

## 2020-02-26 MED ORDER — METOPROLOL TARTRATE 25 MG PO TABS
12.5000 mg | ORAL_TABLET | Freq: Two times a day (BID) | ORAL | Status: DC
  Administered 2020-02-26 (×2): 12.5 mg via ORAL
  Filled 2020-02-26 (×2): qty 1

## 2020-02-26 MED ORDER — INSULIN GLARGINE (1 UNIT DIAL) 300 UNIT/ML ~~LOC~~ SOPN: 50.0000 [IU] | PEN_INJECTOR | Freq: Every day | SUBCUTANEOUS | Status: DC

## 2020-02-26 MED ORDER — LORATADINE 10 MG PO TABS
10.0000 mg | ORAL_TABLET | Freq: Once | ORAL | Status: AC
  Administered 2020-02-26: 10 mg via ORAL
  Filled 2020-02-26: qty 1

## 2020-02-26 MED ORDER — VENLAFAXINE HCL ER 75 MG PO CP24
75.0000 mg | ORAL_CAPSULE | Freq: Three times a day (TID) | ORAL | Status: DC
  Filled 2020-02-26 (×3): qty 1

## 2020-02-26 MED ORDER — LORATADINE 10 MG PO TABS: 10.0000 mg | ORAL_TABLET | Freq: Once | ORAL | Status: DC

## 2020-02-26 MED ORDER — INSULIN GLARGINE 100 UNIT/ML ~~LOC~~ SOLN
40.0000 [IU] | Freq: Every day | SUBCUTANEOUS | Status: DC
  Filled 2020-02-26 (×2): qty 0.4

## 2020-02-26 NOTE — ED Notes (Signed)
Pt refusing vitals at time of D/C. This RN assisting pt to take EKG leads off of chest. Pt said "dont pull those trying to hurt me". Pt ride states "Why would she try to hurt you?". Pt states "Because they have been assing around all day". This RN assured pt that no one was trying to hurt her. This RN assisted pt to car. Pt in NAD at time of D/C. Pt able to ambulate to car from wheelchair with no problem.

## 2020-02-26 NOTE — ED Notes (Signed)
Pt requesting Claritin for nasal congestion and allergies. MD made aware and gave verbal order for 10 mg of Claritin.

## 2020-02-26 NOTE — ED Notes (Signed)
Pt given lunch tray. Pt asking "for a menu so I can pick out what I want to eat."

## 2020-02-26 NOTE — ED Notes (Signed)
Pt ambulatory to bathroom with walker. Pt with steady gait. No assistance from this rn.

## 2020-02-26 NOTE — Evaluation (Addendum)
Physical Therapy Evaluation Patient Details Name: Jessica Donovan MRN: [id number] DOB: Jan 17, 1946 Today's Date: 02/26/2020   History of Present Illness  Jessica Donovan is a 48yoF who comes [facility] on 02/25/20 2 falls at home, refused to go to hopsital 1st time, 2nd fall included hitting back and head on floor. EMS reports RW not in use at time of fall. Questionable roll of polypharmacy per EMS. BP 201/88 upon arrival. Pt reports 2 episodes of 'passing out.' Imaging studies revealing of Acute fractures of the right L3 and L4 transverse processes, new from prior exam 10/20/2019, cervical and thoracic spines cleared for acute abnormality.  Clinical Impression  Pt admitted with above diagnosis. Pt currently with functional limitations due to the deficits listed below (see "PT Problem List"). Upon entry, pt in bed, easily made awake, and agreeable to participate. The pt is alert and oriented x4, pleasant, conversational, and generally a good historian. Orthostatic BP assessed as negative, but pt remains hypertensive throughout, 180s/90s in standing which prohibits any additional ambulatory activity. No noted acute strength deficits, bed mobility and transfers performed with modified independence at her baseline level. After extensive conversation with patient there is no clear indication that pt sustains a LOC preceding these falls contrary to report in ED notes. Pt attests to falling weekly, reports very unsteady without constant UE support while standing. Discussed some activity modification to address these risk factors, sounds like activity modification would be the most meaningful intervention in reducing falls at home, as household tasks that require bimanual manipulation have resulted in falls. Pt will benefit from skilled PT intervention to increase independence and safety with basic mobility in preparation for discharge to the venue listed below.       Follow Up Recommendations Home health PT;Supervision -  Intermittent    Equipment Recommendations  None recommended by PT    Recommendations for Other Services       Precautions / Restrictions Precautions Precautions: Fall Restrictions Weight Bearing Restrictions: No      Mobility  Bed Mobility Overal bed mobility: Modified Independent                Transfers Overall transfer level: Modified independent Equipment used: Rolling walker (2 wheeled)             General transfer comment: STS c RW, no dizziness, no LOB, tolerates standing for 3 minutes, BP remains 180s SBP, low bakc pain becomes limiting  Ambulation/Gait Ambulation/Gait assistance: (deferred d/t unsafe BP for exertion.)              Stairs            Wheelchair Mobility    Modified Rankin (Stroke Patients Only)       Balance Overall balance assessment: History of Falls;No apparent balance deficits (not formally assessed);Modified Independent                                           Pertinent Vitals/Pain Pain Assessment: 0-10 Pain Score: 7  Pain Location: HA at rest, low bakc pain once standing Pain Descriptors / Indicators: Aching Pain Intervention(s): Limited activity within patient's tolerance;Monitored during session;Repositioned    Home Living Family/patient expects to be discharged to:: Private residence Living Arrangements: Alone Available Help at Discharge: Family;Available PRN/intermittently;Neighbor;Friend(s) Type of Home: House Home Access: Level entry     Home Layout: One level Home Equipment: [place] - 2 wheels;Walker -  4 wheels;Cane - single point;Shower seat;Wheelchair - power;Other (comment)(has not needed to use her electric W/C) Additional Comments: Pt ambulates household distances with rollator.    Prior Function           Comments: uses rollator in house, but unsteady without hand support. 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   Dominant Hand: Right    Extremity/Trunk  Assessment   Upper Extremity Assessment Upper Extremity Assessment: Overall WFL for tasks assessed    Lower Extremity Assessment Lower Extremity Assessment: Overall WFL for tasks assessed       Communication   Communication: No difficulties  Cognition Arousal/Alertness: Awake/alert Behavior During Therapy: WFL for tasks assessed/performed Overall Cognitive Status: Within Functional Limits for tasks assessed                                        General Comments      Exercises     Assessment/Plan    PT Assessment Patient needs continued PT services  PT Problem List Decreased strength;Decreased range of motion;Decreased activity tolerance;Decreased balance;Decreased mobility       PT Treatment Interventions DME instruction;Balance training;Gait training;Stair training;Functional mobility training;Patient/family education;Therapeutic activities;Therapeutic exercise    PT Goals (Current goals can be found in the Care Plan section)  Acute Rehab PT Goals Patient Stated Goal: stop falling so much PT Goal Formulation: With patient Time For Goal Achievement: 03/11/20 Potential to Achieve Goals: Fair    Frequency Min 2X/week   Barriers to discharge Decreased caregiver support      Co-evaluation               AM-PAC PT "6 Clicks" Mobility  Outcome Measure Help needed turning from your back to your side while in a flat bed without using bedrails?: A Little Help needed moving from lying on your back to sitting on the side of a flat bed without using bedrails?: A Little Help needed moving to and from a bed to a chair (including a wheelchair)?: A Little Help needed standing up from a chair using your arms (e.g., wheelchair or bedside chair)?: A Little Help needed to walk in hospital room?: A Little Help needed climbing 3-5 steps with a railing? : A Little 6 Click Score: 18    End of Session   Activity Tolerance: Patient tolerated treatment  well;Patient limited by pain;Treatment limited secondary to medical complications (Comment) Patient left: in bed;with call bell/phone within reach Nurse Communication: Mobility status(BP high, pt hungry) PT Visit Diagnosis: Difficulty in walking, not elsewhere classified (R26.2);Unsteadiness on feet (R26.81);Other abnormalities of gait and mobility (R26.89);Repeated falls (R29.6)    Time: 5462-7035 PT Time Calculation (min) (ACUTE ONLY): 19 min   Charges:   PT Evaluation $PT Eval Moderate Complexity: 1 Mod          11:43 AM, 02/26/20 Rosamaria Lints, PT, DPT Physical Therapist - [facility]  [phone] (ASCOM)    Hasani Diemer C 02/26/2020, 11:37 AM

## 2020-02-26 NOTE — ED Notes (Signed)
This RN at bedside to inform pt she is up for discharge. Pt stating she can't leave because her ride Tobi Bastos cannot come get her right now. This RN assured pt she would call her ride to see if this RN could arrange pickup. Pt stating, "You better not call her!"

## 2020-02-26 NOTE — ED Notes (Signed)
Alycia Rossetti called this RN. Informed that when pt wakes up this RN will have pt call Tobi Bastos to update her.

## 2020-02-26 NOTE — ED Notes (Signed)
Pt ambulatory to bedside commode with very little assistance. NAD at this time.

## 2020-02-26 NOTE — ED Provider Notes (Signed)
[facility] has been working with the patient, she has declined placement.  We are arranging for home health.   Jessica Filbert, MD 02/26/20 1426

## 2020-02-26 NOTE — TOC Transition Note (Signed)
Transition of Care [facility]) - CM/SW Discharge Note   Patient Details  Name: Jessica Donovan MRN: [id number] Date of Birth: August 20, 1946  [facility] of [facility]) CM/SW Contact:  Sunrise Cellar, RN Phone Number: 02/26/2020, 2:27 PM   Clinical Narrative:    Confirmed patient will discharge home with [facility]. Services will resume in a couple days per [facility].          Patient Goals and CMS Choice Patient states their goals for this hospitalization and ongoing recovery are:: Get back home and get stronger      Discharge Placement                       Discharge Plan and Services                                     Social Determinants of Health (SDOH) Interventions     Readmission Risk Interventions No flowsheet data found.

## 2020-02-26 NOTE — ED Notes (Signed)
This RN introduced self to pt. Pt ambulatory to the bathroom with use of walker. Pt denies pain at this time. Pt given blueberry muffin that was ordered by prior RN. Pt denies any further needs at this time.

## 2020-02-26 NOTE — ED Notes (Addendum)
This Rn on phone with pt's friend, Tobi Bastos.

## 2020-02-26 NOTE — ED Notes (Addendum)
Pt sitting up on bed, breakfast provided. Water provided as well. Waiting on rest of morning meds to come from pharmacy. Pt requested chocolate milk and blueberry muffin after being handed breakfast tray. Called dietary to order muffin for pt.

## 2020-02-26 NOTE — TOC Initial Note (Signed)
Transition of Care [facility]) - Initial/Assessment Note    Patient Details  Name: Jessica Donovan MRN: [id number] Date of Birth: [birth year]  Transition of Care [facility]) CM/SW Contact:    Anselm Pancoast, RN Phone Number: 02/26/2020, 11:24 AM  Clinical Narrative:                 Spoke with patient who states she lives home alone with her dog. She has a friend who is a Education officer, museum who assists her with ordering groceries online and she has a person who comes and cleans 3 hours a week. Patient does not drive and has friends who take her to appointments. Patient has a shower chair and grab bars installed in bath as well as ramp into home. Patient manages her insulin and diabetes at home. Has history of trauma to foot which resulted in multiple toes amputated several years ago. Patient is agreeable to home health and has no preference to agency. Discussed in detail the process for selecting home health and will update patient.   Expected Discharge Plan: [place]     Patient Goals and CMS Choice Patient states their goals for this hospitalization and ongoing recovery are:: Get back home and get stronger      Expected Discharge Plan and Services Expected Discharge Plan: [place]       Living arrangements for the past 2 months: Single Family Home                                      Prior Living Arrangements/Services Living arrangements for the past 2 months: Single Family Home Lives with:: Self Patient language and need for interpreter reviewed:: Yes Do you feel safe going back to the place where you live?: Yes      Need for Family Participation in Patient Care: No (Comment) Care giver support system in place?: Yes (comment)(Neighbor has friend who is a Education officer, museum and assists as needed) Current home services: DME(Rollator, IT trainer wheelchair, Ramp) Criminal Activity/Legal Involvement Pertinent to Current Situation/Hospitalization: No - Comment  as needed  Activities of Daily Living      Permission Sought/Granted                  Emotional Assessment Appearance:: Appears stated age Attitude/Demeanor/Rapport: Engaged, Self-Confident Affect (typically observed): Accepting, Calm Orientation: : Oriented to Self, Oriented to Place, Oriented to  Time, Oriented to Situation Alcohol / Substance Use: Never Used Psych Involvement: No (comment)  Admission diagnosis:  fall Patient Active Problem List   Diagnosis Date Noted  . Change in mental status 02/08/2020  . Pressure injury of skin 10/23/2019  . Fall   . HTN (hypertension) 10/20/2019  . Right foot infection 12/19/2018  . Acute encephalopathy 07/06/2018  . Anxiety 06/10/2017  . Chest pain with moderate risk for cardiac etiology 06/09/2017  . UTI (urinary tract infection) 05/19/2017  . CAD (coronary artery disease) 05/19/2017  . Chronic narcotic use 05/19/2017  . Cellulitis in diabetic foot ([place]) 05/19/2017  . Opioid overdose ([place]) 02/23/2017  . Altered mental status 02/23/2017  . Acute colitis 01/19/2017  . Sepsis ([place]) 01/17/2017  . Cellulitis of fourth toe of right foot 04/10/2016  . Somnolence 04/09/2016  . Dementia ([place]) 04/09/2016  . Cellulitis 04/03/2016  . Hematoma of right parietal scalp 02/23/2016  . Multiple falls 02/23/2016  . Physical debility 02/23/2016  . Type  II diabetes mellitus, uncontrolled (HCC) 02/23/2016  . Syncope and collapse 02/23/2016  . Polypharmacy 02/23/2016  . Osteomyelitis of toe (HCC) 01/23/2016  . Rhabdomyolysis 12/07/2015  . ARF (acute renal failure) (HCC) 11/29/2015  . Hypotension 11/29/2015  . 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 Angina effort 11/07/2015  . Osteomyelitis (HCC) 11/07/2015  . Anemia due to chronic blood loss 06/14/2015  . Generalized anxiety disorder 06/14/2015  . Lumbago with sciatica 06/14/2015  . 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  .  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 Abnormal weight loss 05/18/2014  . Chronic pain syndrome 05/18/2014  . 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 Diabetic polyneuropathy (HCC) 05/18/2014  . Type 2 diabetes mellitus with diabetic neuropathy (HCC) 05/07/2014  . Hemorrhage of rectum and anus 01/17/2014  . Cellulitis and abscess of foot excluding toe 05/18/2012   PCP:  Garlon Hatchet, MD Pharmacy:   [facility] [phone] [place], [place] - [address] [place] [address] Phone: [phone] Fax: [phone]  [facility] [place], [place] - [address] [place] [address] Phone: [phone] Fax: [phone]  [facility] - [place], [place] - [address] [place] [address] Phone: [phone] Fax: [phone]  [facility] - [place], [place] - [address] [place] [address] Phone: [phone] Fax: [phone]     Social Determinants of Health (SDOH) Interventions    Readmission Risk Interventions No flowsheet data found.

## 2020-02-26 NOTE — ED Notes (Signed)
Pt toilet call light going off. Tech entered room and pt was standing with walker washing hands. Pt told tech that she had a stale food tray and has not even had a hot meal. Tech told pt that she was in the ER and those meals were not usually offered unless pt had been here for several days which was also usually not the case in the ER. Pt told tech that she wanted her medicine and that she would be staying the night even though she didn't want to because her ride would not be able to come get her at this point. Tech told pt she would ask the RN about medicine and see about getting her a tray ordered if  She could. When tech went to ask RN the RN told tech that pt was going to be discharged. 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

## 2020-02-26 NOTE — ED Notes (Signed)
This RN at bedside to provide pt with discharge papers. Pt stating, "How dare you call Tobi Bastos, I told you not to!" This RN informed pt that Tobi Bastos is the one that called this RN and this RN updated Tobi Bastos on patient status and she agreed to pick pt up. Pt stating she wants her medicine. This RN asked pt what medicine she was referring to and pt states, "Idk you're the nurse look it up." This RN informed pt she did not have any medicine ordered for this time. Pt stating, "I want my Ativan." This RN informed pt that Ativan has not been ordered for her and she was about to be discharge. 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 This RN assured pt that everything has been done to provide the best care. Pt stating, "my food came in foam container and nobody told me."

## 2020-02-28 LAB — URINE CULTURE: Culture: >=100000 — AB

## 2020-02-29 NOTE — Progress Notes (Signed)
Discussed patient with:  Dr. Chesley Noon  Assessment:   Patient was recently seen in the ED for weakness/fall that was thought to be related to over sedation. Patient did not present with UTI sxs.    Patient was called about recent results. The following information was noted:   Patient reports sores and pain related to fall   Denies UTI sxs, fever, or chills  She said that the hospital will call her about her results  Discussed urine cultures with MD. Per MD, although patient remains without UTI symptoms antibiotics will be ordered given E.coli > 100,000 CFU.   For this reason it was discussed to start Keflex 500 mg BID x7 days.  Plan:  1. Keflex 500 mg BID x7 days was called to [facility] (patient preference from phone-call earlier today). Pharmacy was unable to reach the patient after talking with the ED physician. Pharmacy asked pharmacist at [facility] to reach out to the patient about prescription.   Thank you for allowing pharmacy to be a part of this patient's care.  Cephus Shelling, PharmD Clinical Pharmacist

## 2020-03-10 NOTE — Progress Notes (Signed)
Cardiology Office Note  Date:  03/11/2020   ID:  Jessica Donovan, Jessica Donovan [birth year], MRN [id number]  PCP:  Garlon Hatchet, MD   Chief Complaint  Patient presents with  . office visit    Hospital F/U-hypotension; Meds verbally reviewed with patient.    HPI:  74 year old woman  history of  coronary artery disease previous stent to the distal RCA and mid circumflex June 2014,  repeat catheterization November 2016 showing patent stents, Poorly controlled diabetes with wounds requiring amputations of toes Hypertension,  hyperlipidemia,  frequent falls,  anxiety,  chronic back pain,  In hospital 06/10/2017 for chest pain, neg enz and stress test OSA presenting for follow up of her chest pain, recent falls  In general she reports having no significant chest pain  Reports having frequent falls Reports having 5 falls Has a care taker that stays 2 hours Has PT  Hospital records reviewed from February In the hospital 01/2020 lethargic and unarousable. blood pressure of 90/59 and a repeat at 84/46 O2 saturation was 86% All her pain medications/Flexeril/morphine and Xanax were held.  Reason for presentation felt secondary to polypharmacy  In the ER, fall 02/25/2020 oversedated from her medications. sats 87% Again same presentation as above  Pain meds managed by Dr. Lissa Morales morphone TID, xanax TID and flexeril TID  Discharge medications from the hospital, Flexeril was discontinued but she reports she is taking it  Morphine and Xanax reduced to no more than twice daily, reports that she is taking this 3 times daily still  Was told to stop Imdur, lisinopril given hypotension and syncope likely from low blood pressure Reports that she was having pain, went back on her Imdur on her own  Other past medical history reviewed In the ER 05/10/2019, mental status changes found poorly responsive in her bed by her neighbor. concerns for overdose  EKG personally reviewed by  myself on todays visit Shows normal sinus rhythm rate 87 bpm poor R-wave progression   Other past medical history reviewed seen several cardiologists in [place],  then [place]  And she requested our group  Previously evaluated in the hospital, felt that her  stents have "fallen out" Pain was felt to be atypical in nature Cardiac enzymes negative 3 EKG unchanged from baseline  She had Stress test 06/10/2017 Pharmacological myocardial perfusion imaging study with no significant  ischemia Normal wall motion, EF estimated at 90% No EKG changes concerning for ischemia at peak stress or in recovery. Low risk scan   PMH:   has a past medical history of Anxiety, Chronic back pain, Collagen vascular disease (HCC), Coronary artery disease, Diabetes mellitus without complication (HCC), Hypertension, Low back pain, Myocardial infarction [facility]), Osteomyelitis of toe (HCC) (01/23/2016), and Stroke [facility]).  PSH:    Past Surgical History:  Procedure Laterality Date  . AMPUTATION TOE Left 11/10/2015   Procedure: AMPUTATION TOE;  Surgeon: Linus Galas, MD;  Location: [facility] ORS;  Service: Podiatry;  Laterality: Left;  . AMPUTATION TOE Left 01/24/2016   Procedure: AMPUTATION TOE (2nd mpj);  Surgeon: Linus Galas, DPM;  Location: [facility] ORS;  Service: Podiatry;  Laterality: Left;  . AMPUTATION TOE Right 12/21/2018   Procedure: 2ND TOE AMPUTATION WITH DEBRIDEMENT OF SOFT TISSUE;  Surgeon: Recardo Evangelist, DPM;  Location: [facility] ORS;  Service: Podiatry;  Laterality: Right;  . CARDIAC CATHETERIZATION N/A 11/12/2015   Procedure: Left Heart Cath and Coronary Angiography;  Surgeon: Marcina Millard, MD;  Location: [facility] INVASIVE CV LAB;  Service: Cardiovascular;  Laterality: N/A;  .  COLONOSCOPY WITH PROPOFOL N/A 07/20/2017   Procedure: COLONOSCOPY WITH PROPOFOL;  Surgeon: Wyline Mood, MD;  Location[facility] ENDOSCOPY;  Service: Endoscopy;  Laterality: N/A;  . JOINT REPLACEMENT    . TOE AMPUTATION      Current Outpatient  Medications  Medication Sig Dispense Refill  . acetaminophen (TYLENOL) 500 MG tablet Take 1,000 mg by mouth every 8 (eight) hours as needed for mild pain or headache.     . ALPRAZolam (XANAX) 1 MG tablet Take 1 tablet (1 mg total) by mouth 2 (two) times daily as needed for anxiety. (Patient taking differently: Take 1 mg by mouth 3 (three) times daily as needed for anxiety. )  0  . AMBULATORY NON FORMULARY MEDICATION Compounded estrogen  Apply 1 gram vaginally M,W, Friday [facility] 1 applicator 0  . aspirin 81 MG tablet Take 81 mg by mouth daily.    [name] atorvastatin (LIPITOR) 20 MG tablet Take 20 mg by mouth daily.     Concepcion Elk CRANBERRY GUMMIES PO Take by mouth. 2 gummies daily    . BIOTIN 5000 PO Take by mouth daily.    . Cyanocobalamin (VITAMIN B-12 PO) Take by mouth daily. 2 gummies daily    . cyclobenzaprine (FLEXERIL) 10 MG tablet Take 10 mg by mouth 3 (three) times daily as needed.     . diphenoxylate-atropine (LOMOTIL) 2.5-0.025 MG tablet Take 1 tablet by mouth every 4 (four) hours as needed for diarrhea or loose stools.     . ferrous sulfate 325 (65 FE) MG tablet Take 325 mg by mouth daily with breakfast.    . fesoterodine (TOVIAZ) 8 MG TB24 tablet Take 8 mg by mouth daily.    . insulin aspart (NOVOLOG) 100 UNIT/ML injection Inject 3-15 Units into the skin 3 (three) times daily with meals as needed for high blood sugar. Pt uses as needed per sliding scale:    Less than 140:  0 units  140-180:  3 units 181-220:  4 units 221- 260:  6 units 261- 320:  8 units 321-360:  10 units 361-400:  12 units Greater than 400:  15 units    . metoprolol tartrate (LOPRESSOR) 25 MG tablet Take 0.5 tablets (12.5 mg total) by mouth 2 (two) times daily. (Patient taking differently: Take 25 mg by mouth 2 (two) times daily. ) 30 tablet 0  . mirtazapine (REMERON) 15 MG tablet Take 15 mg by mouth at bedtime.    [name] morphine (MSIR) 15 MG tablet Take 1 tablet (15 mg total) by mouth 2 (two) times daily.  (Patient taking differently: Take 15 mg by mouth every 4 (four) hours as needed. )    . Multiple Vitamins-Minerals (HAIR SKIN AND NAILS FORMULA PO) Take by mouth daily. 2 gummies daily    . omeprazole (PRILOSEC) 20 MG capsule Take 20 mg by mouth daily.    . pregabalin (LYRICA) 150 MG capsule Take 150 mg by mouth in the morning, at noon, and at bedtime.    . promethazine (PHENERGAN) 25 MG tablet Take 25 mg by mouth every 4 (four) hours as needed for nausea or vomiting.    [name] TOUJEO SOLOSTAR 300 UNIT/ML SOPN Inject 50 Units into the skin daily.    . traZODone (DESYREL) 100 MG tablet Take 100 mg by mouth at bedtime.    [name] venlafaxine XR (EFFEXOR-XR) 75 MG 24 hr capsule Take 75 mg by mouth 3 (three) times daily.    . Vitamin D, Ergocalciferol, (DRISDOL) 50000 units CAPS capsule Take  50,000 Units by mouth every Sunday.     [name] HUMALOG KWIKPEN 100 UNIT/ML KwikPen Inject 10 Units into the skin 3 (three) times daily. Pt just on sliding scale with meals     No current facility-administered medications for this visit.     Allergies:   Ampicillin, Bactrim [sulfamethoxazole-trimethoprim], Codeine, Flu virus vaccine, Influenza vaccines, Methadone, Oxycodone-acetaminophen, Percocet [oxycodone-acetaminophen], Sulfa antibiotics, Tetanus antitoxin, Tetanus toxoids, Penicillin g, and Tetracyclines & related   Social History:  The patient  reports that she has never smoked. She has never used smokeless tobacco. She reports that she does not drink alcohol or use drugs.   Family History:   family history includes CAD in her father and mother.    Review of Systems: Review of Systems  Constitutional: Negative.   Respiratory: Negative.   Cardiovascular: Negative.   Gastrointestinal: Negative.   Musculoskeletal: Negative.   Neurological: Negative.   Psychiatric/Behavioral: Negative.   All other systems reviewed and are negative.    PHYSICAL EXAM: VS:  BP 110/70 (BP Location: Left Arm, Patient Position:  Sitting, Cuff Size: Normal)   Pulse 87   Ht 5\' 6"  (1.676 m)   Wt 195 lb 2 oz (88.5 kg)   SpO2 96%   BMI 31.49 kg/m  , BMI Body mass index is 31.49 kg/m. Constitutional:  oriented to person, place, and time. No distress.  HENT:  Head: Grossly normal Eyes:  no discharge. No scleral icterus.  Neck: No JVD, no carotid bruits  Cardiovascular: Regular rate and rhythm, no murmurs appreciated Pulmonary/Chest: Clear to auscultation bilaterally, no wheezes or rails Abdominal: Soft.  no distension.  no tenderness.  Musculoskeletal: Normal range of motion Neurological:  normal muscle tone. Coordination normal. No atrophy Skin: Skin warm and dry Psychiatric: normal affect, pleasant   Recent Labs: 02/25/2020: ALT 14; B Natriuretic Peptide 198.0; BUN 16; Creatinine, Ser 0.60; Hemoglobin 14.4; Platelets 222; Potassium 4.2; Sodium 137    Lipid Panel Lab Results  Component Value Date   CHOL 158 06/09/2017   HDL 46 06/09/2017   LDLCALC 64 06/09/2017   TRIG 242 (H) 06/09/2017      Wt Readings from Last 3 Encounters:  03/11/20 195 lb 2 oz (88.5 kg)  02/25/20 178 lb (80.7 kg)  02/08/20 183 lb 9.6 oz (83.3 kg)       ASSESSMENT AND PLAN:  Coronary artery disease of native artery of native heart with stable angina pectoris (HCC) Continue current medications Cholesterol at goal Recommend she try to wean down her off the isosorbide given her low blood pressure and tendency to overmedicate with her psychotropic and pain medication causing hypotension  Chronic chest pain Long history of changing cardiologists, 2016 workup including cardiac catheterization showing patent stents, stress test 2 2017 and 2018 with no ischemia  30% lesions noted on catheterization November 2016 No further workup needed.  Recommend she try to wean off the isosorbide  Anxiety Followed by primary care Notes not available  Frequent falls/balance problems Long history of overmedicating She reports her  Flexeril morphine Xanax comes from primary care Dr. Delila Spence She was recommended to stop the Flexeril but she is taking this 3 times daily Recommended to decrease Xanax and morphine down to twice daily but she continues to take this 3 times daily To recent hospital admissions for hypotension and hypoxia felt secondary to using too much of this medication -She does not seem to be following guidance from the discharge paperwork Stressed to her the dangers of being overmedicated she  reports that she " needs them"  Diabetes type 2 Follow-up primary care  Numerous hospitalizations, numerous medication issues discussed with her, discussed polypharmacy  Total encounter time more than 45 minutes  Greater than 50% was spent in counseling and coordination of care with the patient     Orders Placed This Encounter  Procedures  . EKG 12-Lead     Signed, Dossie Arbour, M.D., Ph.D. 03/11/2020  [facility] [place], [place] [phone]

## 2020-03-11 ENCOUNTER — Ambulatory Visit (INDEPENDENT_AMBULATORY_CARE_PROVIDER_SITE_OTHER): Payer: Medicare Other | Admitting: Cardiovascular Disease

## 2020-03-11 ENCOUNTER — Other Ambulatory Visit: Payer: Self-pay

## 2020-03-11 ENCOUNTER — Encounter: Payer: Self-pay | Admitting: Cardiovascular Disease

## 2020-03-11 VITALS — BP 110/70 | HR 87 | Ht 66.0 in | Wt 195.1 lb

## 2020-03-11 DIAGNOSIS — I25118 Atherosclerotic heart disease of native coronary artery with other forms of angina pectoris: Secondary | ICD-10-CM

## 2020-03-11 DIAGNOSIS — E1165 Type 2 diabetes mellitus with hyperglycemia: Secondary | ICD-10-CM | POA: Diagnosis not present

## 2020-03-11 DIAGNOSIS — I259 Chronic ischemic heart disease, unspecified: Secondary | ICD-10-CM | POA: Diagnosis not present

## 2020-03-11 NOTE — Patient Instructions (Signed)
Try not to take the imdur/isosorbide if you dont need to Blood pressure is low  Medication Instructions:  No changes  If you need a refill on your cardiac medications before your next appointment, please call your pharmacy.    Lab work: No new labs needed   If you have labs (blood work) drawn today and your tests are completely normal, you will receive your results only by: [name] MyChart Message (if you have MyChart) OR . A paper copy in the mail If you have any lab test that is abnormal or we need to change your treatment, we will call you to review the results.   Testing/Procedures: No new testing needed   Follow-Up: At [facility], you and your health needs are our priority.  As part of our continuing mission to provide you with exceptional heart care, we have created designated Provider Care Teams.  These Care Teams include your primary Cardiologist (physician) and Advanced Practice Providers (APPs -  Physician Assistants and Nurse Practitioners) who all work together to provide you with the care you need, when you need it.  . You will need a follow up appointment in 12 months  . Providers on your designated Care Team:   . Nicolasa Ducking, NP . Eula Listen, PA-C . Marisue Ivan, PA-C  Any Other Special Instructions Will Be Listed Below (If Applicable).  For educational health videos Log in to : [email or web address] Or : [email or web address], password : [email or web address]

## 2020-03-27 ENCOUNTER — Emergency Department: Payer: Medicare Other

## 2020-03-27 ENCOUNTER — Other Ambulatory Visit: Payer: Self-pay

## 2020-03-27 ENCOUNTER — Observation Stay
Admission: EM | Admit: 2020-03-27 | Discharge: 2020-03-28 | Disposition: A | Discharge status: Home / Self Care | Payer: Medicare Other | Attending: Family Medicine | Admitting: Family Medicine

## 2020-03-27 ENCOUNTER — Observation Stay (HOSPITAL_BASED_OUTPATIENT_CLINIC_OR_DEPARTMENT_OTHER)
Admit: 2020-03-27 | Discharge: 2020-03-27 | Disposition: A | Discharge status: Home / Self Care | Payer: Medicare Other | Attending: Internal Medicine | Admitting: Internal Medicine

## 2020-03-27 DIAGNOSIS — R4182 Altered mental status, unspecified: Secondary | ICD-10-CM

## 2020-03-27 DIAGNOSIS — Z882 Allergy status to sulfonamides status: Secondary | ICD-10-CM | POA: Insufficient documentation

## 2020-03-27 DIAGNOSIS — G92 Toxic encephalopathy: Secondary | ICD-10-CM | POA: Diagnosis not present

## 2020-03-27 DIAGNOSIS — I959 Hypotension, unspecified: Secondary | ICD-10-CM | POA: Diagnosis not present

## 2020-03-27 DIAGNOSIS — F419 Anxiety disorder, unspecified: Secondary | ICD-10-CM | POA: Insufficient documentation

## 2020-03-27 DIAGNOSIS — T50915A Adverse effect of multiple unspecified drugs, medicaments and biological substances, initial encounter: Secondary | ICD-10-CM | POA: Insufficient documentation

## 2020-03-27 DIAGNOSIS — G9341 Metabolic encephalopathy: Secondary | ICD-10-CM | POA: Diagnosis not present

## 2020-03-27 DIAGNOSIS — R55 Syncope and collapse: Secondary | ICD-10-CM | POA: Insufficient documentation

## 2020-03-27 DIAGNOSIS — G8929 Other chronic pain: Secondary | ICD-10-CM | POA: Insufficient documentation

## 2020-03-27 DIAGNOSIS — Z881 Allergy status to other antibiotic agents status: Secondary | ICD-10-CM | POA: Diagnosis not present

## 2020-03-27 DIAGNOSIS — Z89429 Acquired absence of other toe(s), unspecified side: Secondary | ICD-10-CM | POA: Insufficient documentation

## 2020-03-27 DIAGNOSIS — M545 Low back pain: Secondary | ICD-10-CM | POA: Diagnosis not present

## 2020-03-27 DIAGNOSIS — I10 Essential (primary) hypertension: Secondary | ICD-10-CM | POA: Diagnosis not present

## 2020-03-27 DIAGNOSIS — Z8673 Personal history of transient ischemic attack (TIA), and cerebral infarction without residual deficits: Secondary | ICD-10-CM | POA: Insufficient documentation

## 2020-03-27 DIAGNOSIS — Z885 Allergy status to narcotic agent status: Secondary | ICD-10-CM | POA: Diagnosis not present

## 2020-03-27 DIAGNOSIS — E1140 Type 2 diabetes mellitus with diabetic neuropathy, unspecified: Secondary | ICD-10-CM | POA: Diagnosis not present

## 2020-03-27 DIAGNOSIS — Z20822 Contact with and (suspected) exposure to covid-19: Secondary | ICD-10-CM | POA: Insufficient documentation

## 2020-03-27 DIAGNOSIS — I252 Old myocardial infarction: Secondary | ICD-10-CM | POA: Insufficient documentation

## 2020-03-27 DIAGNOSIS — Z8249 Family history of ischemic heart disease and other diseases of the circulatory system: Secondary | ICD-10-CM | POA: Insufficient documentation

## 2020-03-27 DIAGNOSIS — Z794 Long term (current) use of insulin: Secondary | ICD-10-CM | POA: Insufficient documentation

## 2020-03-27 DIAGNOSIS — I2510 Atherosclerotic heart disease of native coronary artery without angina pectoris: Secondary | ICD-10-CM | POA: Diagnosis not present

## 2020-03-27 DIAGNOSIS — J9602 Acute respiratory failure with hypercapnia: Secondary | ICD-10-CM | POA: Diagnosis not present

## 2020-03-27 DIAGNOSIS — R4189 Other symptoms and signs involving cognitive functions and awareness: Secondary | ICD-10-CM | POA: Diagnosis not present

## 2020-03-27 DIAGNOSIS — Z955 Presence of coronary angioplasty implant and graft: Secondary | ICD-10-CM | POA: Insufficient documentation

## 2020-03-27 DIAGNOSIS — Z880 Allergy status to penicillin: Secondary | ICD-10-CM | POA: Diagnosis not present

## 2020-03-27 DIAGNOSIS — Z7982 Long term (current) use of aspirin: Secondary | ICD-10-CM | POA: Diagnosis not present

## 2020-03-27 DIAGNOSIS — W19XXXA Unspecified fall, initial encounter: Secondary | ICD-10-CM | POA: Diagnosis present

## 2020-03-27 DIAGNOSIS — Z79899 Other long term (current) drug therapy: Secondary | ICD-10-CM | POA: Diagnosis not present

## 2020-03-27 DIAGNOSIS — N390 Urinary tract infection, site not specified: Secondary | ICD-10-CM

## 2020-03-27 LAB — URINE DRUG SCREEN, QUALITATIVE (ARMC ONLY)
Amphetamines, Ur Screen: NOT DETECTED
Barbiturates, Ur Screen: NOT DETECTED
Benzodiazepine, Ur Scrn: POSITIVE — AB
Cannabinoid 50 Ng, Ur ~~LOC~~: NOT DETECTED
Cocaine Metabolite,Ur ~~LOC~~: NOT DETECTED
MDMA (Ecstasy)Ur Screen: NOT DETECTED
Methadone Scn, Ur: NOT DETECTED
Opiate, Ur Screen: POSITIVE — AB
Phencyclidine (PCP) Ur S: NOT DETECTED
Tricyclic, Ur Screen: POSITIVE — AB

## 2020-03-27 LAB — CBC WITH DIFFERENTIAL/PLATELET
Abs Immature Granulocytes: 0.04 10*3/uL (ref 0.00–0.07)
Basophils Absolute: 0 10*3/uL (ref 0.0–0.1)
Basophils Relative: 0 %
Eosinophils Absolute: 0.2 10*3/uL (ref 0.0–0.5)
Eosinophils Relative: 1 %
HCT: 40.2 % (ref 36.0–46.0)
Hemoglobin: 13.1 g/dL (ref 12.0–15.0)
Immature Granulocytes: 0 %
Lymphocytes Relative: 27 %
Lymphs Abs: 2.8 10*3/uL (ref 0.7–4.0)
MCH: 32.2 pg (ref 26.0–34.0)
MCHC: 32.6 g/dL (ref 30.0–36.0)
MCV: 98.8 fL (ref 80.0–100.0)
Monocytes Absolute: 1 10*3/uL (ref 0.1–1.0)
Monocytes Relative: 9 %
Neutro Abs: 6.4 10*3/uL (ref 1.7–7.7)
Neutrophils Relative %: 63 %
Platelets: 252 10*3/uL (ref 150–400)
RBC: 4.07 MIL/uL (ref 3.87–5.11)
RDW: 12.8 % (ref 11.5–15.5)
WBC: 10.4 10*3/uL (ref 4.0–10.5)
nRBC: 0 % (ref 0.0–0.2)

## 2020-03-27 LAB — COMPREHENSIVE METABOLIC PANEL
ALT: 10 U/L (ref 0–44)
AST: 19 U/L (ref 15–41)
Albumin: 3.7 g/dL (ref 3.5–5.0)
Alkaline Phosphatase: 65 U/L (ref 38–126)
Anion gap: 11 (ref 5–15)
BUN: 25 mg/dL — ABNORMAL HIGH (ref 8–23)
CO2: 25 mmol/L (ref 22–32)
Calcium: 9.8 mg/dL (ref 8.9–10.3)
Chloride: 104 mmol/L (ref 98–111)
Creatinine, Ser: 0.85 mg/dL (ref 0.44–1.00)
GFR calc Af Amer: >60 mL/min (ref >60)
GFR calc non Af Amer: >60 mL/min (ref >60)
Glucose, Bld: 226 mg/dL — ABNORMAL HIGH (ref 70–99)
Potassium: 4.6 mmol/L (ref 3.5–5.1)
Sodium: 140 mmol/L (ref 135–145)
Total Bilirubin: 0.5 mg/dL (ref 0.3–1.2)
Total Protein: 6.3 g/dL — ABNORMAL LOW (ref 6.5–8.1)

## 2020-03-27 LAB — URINALYSIS, COMPLETE (UACMP) WITH MICROSCOPIC
Bacteria, UA: NONE SEEN
Bilirubin Urine: NEGATIVE
Glucose, UA: NEGATIVE mg/dL
Ketones, ur: NEGATIVE mg/dL
Nitrite: NEGATIVE
Protein, ur: NEGATIVE mg/dL
Specific Gravity, Urine: 1.028 (ref 1.005–1.030)
pH: 5 (ref 5.0–8.0)

## 2020-03-27 LAB — ECHOCARDIOGRAM COMPLETE: Weight: 3030 oz

## 2020-03-27 LAB — BLOOD GAS, ARTERIAL
Acid-Base Excess: 2 mmol/L (ref 0.0–2.0)
Bicarbonate: 29.4 mmol/L — ABNORMAL HIGH (ref 20.0–28.0)
FIO2: 0.28
O2 Saturation: 95.4 %
Patient temperature: 37
pCO2 arterial: 57 mmHg — ABNORMAL HIGH (ref 32.0–48.0)
pH, Arterial: 7.32 — ABNORMAL LOW (ref 7.350–7.450)
pO2, Arterial: 84 mmHg (ref 83.0–108.0)

## 2020-03-27 LAB — GLUCOSE, CAPILLARY
Glucose-Capillary: 220 mg/dL — ABNORMAL HIGH (ref 70–99)
Glucose-Capillary: 229 mg/dL — ABNORMAL HIGH (ref 70–99)

## 2020-03-27 LAB — RESPIRATORY PANEL BY RT PCR (FLU A&B, COVID)
Influenza A by PCR: NEGATIVE
Influenza B by PCR: NEGATIVE
SARS Coronavirus 2 by RT PCR: NEGATIVE

## 2020-03-27 LAB — AMMONIA: Ammonia: 35 umol/L (ref 9–35)

## 2020-03-27 LAB — LACTIC ACID, PLASMA: Lactic Acid, Venous: 1.9 mmol/L (ref 0.5–1.9)

## 2020-03-27 LAB — CK: Total CK: 72 U/L (ref 38–234)

## 2020-03-27 LAB — TROPONIN I (HIGH SENSITIVITY): Troponin I (High Sensitivity): 5 ng/L (ref <18)

## 2020-03-27 MED ORDER — SODIUM CHLORIDE 0.9% FLUSH
3.0000 mL | Freq: Two times a day (BID) | INTRAVENOUS | Status: DC
  Administered 2020-03-27 – 2020-03-28 (×2): 3 mL via INTRAVENOUS

## 2020-03-27 MED ORDER — INSULIN ASPART 100 UNIT/ML ~~LOC~~ SOLN
0.0000 [IU] | Freq: Three times a day (TID) | SUBCUTANEOUS | Status: DC
  Administered 2020-03-27: 17:00:00 5 [IU] via SUBCUTANEOUS
  Administered 2020-03-28: 12:00:00 11 [IU] via SUBCUTANEOUS
  Administered 2020-03-28: 09:00:00 3 [IU] via SUBCUTANEOUS
  Filled 2020-03-27 (×3): qty 1

## 2020-03-27 MED ORDER — MORPHINE SULFATE 15 MG PO TABS
15.0000 mg | ORAL_TABLET | Freq: Four times a day (QID) | ORAL | Status: DC | PRN
  Administered 2020-03-27 – 2020-03-28 (×3): 15 mg via ORAL
  Filled 2020-03-27 (×3): qty 1

## 2020-03-27 MED ORDER — FOSFOMYCIN TROMETHAMINE 3 G PO PACK
3.0000 g | PACK | Freq: Once | ORAL | Status: AC
  Administered 2020-03-27: 17:00:00 3 g via ORAL
  Filled 2020-03-27: qty 3

## 2020-03-27 MED ORDER — ONDANSETRON HCL 4 MG PO TABS: 4.0000 mg | ORAL_TABLET | Freq: Four times a day (QID) | ORAL | Status: DC | PRN

## 2020-03-27 MED ORDER — ONDANSETRON HCL 4 MG/2ML IJ SOLN: 4.0000 mg | Freq: Four times a day (QID) | INTRAMUSCULAR | Status: DC | PRN

## 2020-03-27 MED ORDER — TRAZODONE HCL 50 MG PO TABS
50.0000 mg | ORAL_TABLET | Freq: Every evening | ORAL | Status: DC | PRN
  Administered 2020-03-27: 50 mg via ORAL
  Filled 2020-03-27: qty 1

## 2020-03-27 MED ORDER — INSULIN ASPART 100 UNIT/ML ~~LOC~~ SOLN
0.0000 [IU] | Freq: Every day | SUBCUTANEOUS | Status: DC
  Administered 2020-03-27: 21:00:00 2 [IU] via SUBCUTANEOUS
  Filled 2020-03-27: qty 1

## 2020-03-27 MED ORDER — FLUTICASONE PROPIONATE 50 MCG/ACT NA SUSP
1.0000 | Freq: Every day | NASAL | Status: DC
  Administered 2020-03-27 – 2020-03-28 (×2): 1 via NASAL
  Filled 2020-03-27: qty 16

## 2020-03-27 MED ORDER — KETOROLAC TROMETHAMINE 15 MG/ML IJ SOLN
15.0000 mg | Freq: Four times a day (QID) | INTRAMUSCULAR | Status: DC | PRN
  Administered 2020-03-27 (×2): 15 mg via INTRAVENOUS
  Filled 2020-03-27 (×2): qty 1

## 2020-03-27 MED ORDER — ACETAMINOPHEN 500 MG PO TABS
1000.0000 mg | ORAL_TABLET | Freq: Three times a day (TID) | ORAL | Status: DC | PRN
  Administered 2020-03-27 – 2020-03-28 (×2): 1000 mg via ORAL
  Filled 2020-03-27 (×2): qty 2

## 2020-03-27 MED ORDER — ENOXAPARIN SODIUM 40 MG/0.4ML ~~LOC~~ SOLN: 40.0000 mg | SUBCUTANEOUS | Status: DC

## 2020-03-27 MED ORDER — SODIUM CHLORIDE 0.9 % IV BOLUS
1000.0000 mL | Freq: Once | INTRAVENOUS | Status: AC
  Administered 2020-03-27: 02:00:00 1000 mL via INTRAVENOUS

## 2020-03-27 MED ORDER — DILTIAZEM HCL 25 MG/5ML IV SOLN: 20.0000 mg | Freq: Once | INTRAVENOUS | Status: DC

## 2020-03-27 MED ORDER — SODIUM CHLORIDE 0.9 % IV SOLN: INTRAVENOUS | Status: DC

## 2020-03-27 NOTE — ED Triage Notes (Signed)
Pt to the er via ems for a fall. Neighbors stated pt dog was barking for 10 minutes so they went to check on pt. Pt was found on the floor of the kitchen. Pt alert to name and place but very sleepy. Pt denies taking her night time meds.

## 2020-03-27 NOTE — ED Provider Notes (Signed)
[facility] Emergency Department Provider Note   ____________________________________________   First MD Initiated Contact with Patient 03/27/20 0100     (approximate)  I have reviewed the triage vital signs and the nursing notes.   HISTORY  Chief Complaint Level V caveat: Limited by decreased LOC   HPI Jessica Donovan is a 74 y.o. female brought to the ED via EMS from home with a chief complaint of unresponsive state.  Patient lives in an apartment complex and her neighbors came to check on her when her dog bark incessantly for over 10 minutes.  She was found unresponsive on her kitchen floor.  EMS reports patient is able to tell them her name and date of birth.  Reportedly takes tramadol at night before bed at 10 PM but patient denies it tonight.  Rest of history is unobtainable secondary to patient's decreased LOC.       Past Medical History:  Diagnosis Date  . Anxiety   . Chronic back pain   . Collagen vascular disease (HCC)   . Coronary artery disease    a. s/p PCI/DES to dRCA & mLCx in 2014 with repeat LHC in 10/2015 showing patent stents  . Diabetes mellitus without complication (HCC)   . Hypertension   . Low back pain   . Myocardial infarction (HCC)   . Osteomyelitis of toe (HCC) 01/23/2016  . Stroke [facility](HCC)     Patient Active Problem List   Diagnosis Date Noted  . Change in mental status 02/08/2020  . Pressure injury of skin 10/23/2019  . Fall   . HTN (hypertension) 10/20/2019  . Right foot infection 12/19/2018  . Acute encephalopathy 07/06/2018  . Anxiety 06/10/2017  . Chest pain with moderate risk for cardiac etiology 06/09/2017  . UTI (urinary tract infection) 05/19/2017  . CAD (coronary artery disease) 05/19/2017  . Chronic narcotic use 05/19/2017  . Cellulitis in diabetic foot (HCC) 05/19/2017  . Opioid overdose (HCC) 02/23/2017  . Altered mental status 02/23/2017  . Acute colitis 01/19/2017  . Sepsis (HCC) 01/17/2017  .  Cellulitis of fourth toe of right foot 04/10/2016  . Somnolence 04/09/2016  . Dementia (HCC) 04/09/2016  . Cellulitis 04/03/2016  . Hematoma of right parietal scalp 02/23/2016  . Multiple falls 02/23/2016  . Physical debility 02/23/2016  . Type II diabetes mellitus, uncontrolled (HCC) 02/23/2016  . Syncope and collapse 02/23/2016  . Polypharmacy 02/23/2016  . Osteomyelitis of toe (HCC) 01/23/2016  . Rhabdomyolysis 12/07/2015  . ARF (acute renal failure) (HCC) 11/29/2015  . Hypotension 11/29/2015  . 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 Angina effort 11/07/2015  . Osteomyelitis (HCC) 11/07/2015  . Anemia due to chronic blood loss 06/14/2015  . Generalized anxiety disorder 06/14/2015  . Lumbago with sciatica 06/14/2015  . 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  . 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 Abnormal weight loss 05/18/2014  . Chronic pain syndrome 05/18/2014  . 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 Diabetic polyneuropathy (HCC) 05/18/2014  . Type 2 diabetes mellitus with diabetic neuropathy (HCC) 05/07/2014  . Hemorrhage of rectum and anus 01/17/2014  . Cellulitis and abscess of foot excluding toe 05/18/2012    Past Surgical History:  Procedure Laterality Date  . AMPUTATION TOE Left 11/10/2015   Procedure: AMPUTATION TOE;  Surgeon: Linus Galasodd Cline, MD;  Location: [facility] ORS;  Service: Podiatry;  Laterality: Left;  . AMPUTATION TOE Left  01/24/2016   Procedure: AMPUTATION TOE (2nd mpj);  Surgeon: Linus Galas, DPM;  Location: [facility] ORS;  Service: Podiatry;  Laterality: Left;  . AMPUTATION TOE Right 12/21/2018   Procedure: 2ND TOE AMPUTATION WITH DEBRIDEMENT OF SOFT TISSUE;  Surgeon: Recardo Evangelist, DPM;  Location: [facility] ORS;  Service: Podiatry;  Laterality: Right;  . CARDIAC CATHETERIZATION N/A 11/12/2015   Procedure: Left Heart Cath and  Coronary Angiography;  Surgeon: Marcina Millard, MD;  Location: [facility] INVASIVE CV LAB;  Service: Cardiovascular;  Laterality: N/A;  . COLONOSCOPY WITH PROPOFOL N/A 07/20/2017   Procedure: COLONOSCOPY WITH PROPOFOL;  Surgeon: Wyline Mood, MD;  Location: [facility] ENDOSCOPY;  Service: Endoscopy;  Laterality: N/A;  . JOINT REPLACEMENT    . TOE AMPUTATION      Prior to Admission medications   Medication Sig Start Date End Date Taking? Authorizing Provider  acetaminophen (TYLENOL) 500 MG tablet Take 1,000 mg by mouth every 8 (eight) hours as needed for mild pain or headache.     [provider]  ALPRAZolam Prudy Feeler) 1 MG tablet Take 1 tablet (1 mg total) by mouth 2 (two) times daily as needed for anxiety. Patient taking differently: Take 1 mg by mouth 3 (three) times daily as needed for anxiety.  02/09/20   Lynn Ito, MD  AMBULATORY NON FORMULARY MEDICATION Compounded estrogen  Apply 1 gram vaginally M,W, [facility] 01/12/20   Vanna Scotland, MD  aspirin 81 MG tablet Take 81 mg by mouth daily.    [provider]  atorvastatin (LIPITOR) 20 MG tablet Take 20 mg by mouth daily.     [provider]  AZO CRANBERRY GUMMIES PO Take by mouth. 2 gummies daily    [provider]  BIOTIN 5000 PO Take by mouth daily.    [provider]  Cyanocobalamin (VITAMIN B-12 PO) Take by mouth daily. 2 gummies daily    [provider]  cyclobenzaprine (FLEXERIL) 10 MG tablet Take 10 mg by mouth 3 (three) times daily as needed.  02/12/20   [provider]  diphenoxylate-atropine (LOMOTIL) 2.5-0.025 MG tablet Take 1 tablet by mouth every 4 (four) hours as needed for diarrhea or loose stools.     [provider]  ferrous sulfate 325 (65 FE) MG tablet Take 325 mg by mouth daily with breakfast.    [provider]  fesoterodine (TOVIAZ) 8 MG TB24 tablet Take 8 mg by mouth daily.    [provider]  HUMALOG KWIKPEN 100 UNIT/ML  KwikPen Inject 10 Units into the skin 3 (three) times daily. Pt just on sliding scale with meals 01/19/20   [provider]  insulin aspart (NOVOLOG) 100 UNIT/ML injection Inject 3-15 Units into the skin 3 (three) times daily with meals as needed for high blood sugar. Pt uses as needed per sliding scale:    Less than 140:  0 units  140-180:  3 units 181-220:  4 units 221- 260:  6 units 261- 320:  8 units 321-360:  10 units 361-400:  12 units Greater than 400:  15 units    [provider]  metoprolol tartrate (LOPRESSOR) 25 MG tablet Take 0.5 tablets (12.5 mg total) by mouth 2 (two) times daily. Patient taking differently: Take 25 mg by mouth 2 (two) times daily.  02/09/20   Lynn Ito, MD  mirtazapine (REMERON) 15 MG tablet Take 15 mg by mouth at bedtime.    [provider]  morphine (MSIR) 15 MG tablet Take 1 tablet (15 mg total)  by mouth 2 (two) times daily. Patient taking differently: Take 15 mg by mouth every 4 (four) hours as needed.  02/09/20   Lynn Ito, MD  Multiple Vitamins-Minerals (HAIR SKIN AND NAILS FORMULA PO) Take by mouth daily. 2 gummies daily    [provider]  omeprazole (PRILOSEC) 20 MG capsule Take 20 mg by mouth daily.    [provider]  pregabalin (LYRICA) 150 MG capsule Take 150 mg by mouth in the morning, at noon, and at bedtime.    [provider]  promethazine (PHENERGAN) 25 MG tablet Take 25 mg by mouth every 4 (four) hours as needed for nausea or vomiting.    [provider]  TOUJEO SOLOSTAR 300 UNIT/ML SOPN Inject 50 Units into the skin daily. 08/08/19   [provider]  traZODone (DESYREL) 100 MG tablet Take 100 mg by mouth at bedtime.    [provider]  venlafaxine XR (EFFEXOR-XR) 75 MG 24 hr capsule Take 75 mg by mouth 3 (three) times daily.    [provider]  Vitamin D, Ergocalciferol, (DRISDOL) 50000 units CAPS capsule Take 50,000 Units by mouth every Sunday.      [provider]    Allergies Ampicillin, Bactrim [sulfamethoxazole-trimethoprim], Codeine, Flu virus vaccine, Influenza vaccines, Methadone, Oxycodone-acetaminophen, Percocet [oxycodone-acetaminophen], Sulfa antibiotics, Tetanus antitoxin, Tetanus toxoids, Penicillin g, and Tetracyclines & related  Family History  Problem Relation Age of Onset  . CAD Mother   . CAD Father     Social History Social History   Tobacco Use  . Smoking status: Never Smoker  . Smokeless tobacco: Never Used  Substance Use Topics  . Alcohol use: No  . Drug use: No    Review of Systems  Constitutional: Positive for unresponsive state.  No fever/chills Eyes: No visual changes. ENT: No sore throat. Cardiovascular: Denies chest pain. Respiratory: Denies shortness of breath. Gastrointestinal: No abdominal pain.  No nausea, no vomiting.  No diarrhea.  No constipation. Genitourinary: Negative for dysuria. Musculoskeletal: Negative for back pain. Skin: Negative for rash. Neurological: Negative for headaches, focal weakness or numbness. 10 point review of systems limited secondary to patient's decreased LOC  ____________________________________________   PHYSICAL EXAM:  VITAL SIGNS: ED Triage Vitals  Enc Vitals Group     BP      Pulse      Resp      Temp      Temp src      SpO2      Weight      Height      Head Circumference      Peak Flow      Pain Score      Pain Loc      Pain Edu?      Excl. in GC?     Constitutional: Somnolent but arousable to voice.  Elderly appearing and in no acute distress. Eyes: Conjunctivae are normal. PERRL. EOMI. Head: Atraumatic. Nose: Atraumatic. Mouth/Throat: Mucous membranes are mildly dry.  No dental malocclusion. Neck: No stridor.  No cervical spine tenderness to palpation.  No step-offs or deformities noted. Cardiovascular: Normal rate, regular rhythm. Grossly normal heart sounds.  Good peripheral circulation. Respiratory: Normal  respiratory effort.  No retractions. Lungs CTAB. Gastrointestinal: Soft and nontender to light or deep palpation. No distention. No abdominal bruits. No CVA tenderness. Musculoskeletal: No lower extremity tenderness.  1+ BLE nonpitting edema.  No joint effusions. Neurologic: Alert and oriented to person and place.  Slurred speech and language. No gross focal  neurologic deficits are appreciated. MAEx4. Skin:  Skin is warm, dry and intact. No rash noted. Psychiatric: Mood and affect are normal. Speech and behavior are normal.  ____________________________________________   LABS (all labs ordered are listed, but only abnormal results are displayed)  Labs Reviewed  COMPREHENSIVE METABOLIC PANEL - Abnormal; Notable for the following components:      Result Value   Glucose, Bld 226 (*)    BUN 25 (*)    Total Protein 6.3 (*)    All other components within normal limits  URINALYSIS, COMPLETE (UACMP) WITH MICROSCOPIC - Abnormal; Notable for the following components:   Color, Urine YELLOW (*)    APPearance CLEAR (*)    Hgb urine dipstick SMALL (*)    Leukocytes,Ua TRACE (*)    All other components within normal limits  URINE DRUG SCREEN, QUALITATIVE ([facility] ONLY) - Abnormal; Notable for the following components:   Tricyclic, Ur Screen POSITIVE (*)    Opiate, Ur Screen POSITIVE (*)    Benzodiazepine, Ur Scrn POSITIVE (*)    All other components within normal limits  RESPIRATORY PANEL BY RT PCR (FLU A&B, COVID)  URINE CULTURE  CBC WITH DIFFERENTIAL/PLATELET  LACTIC ACID, PLASMA  AMMONIA  CK  LACTIC ACID, PLASMA  TROPONIN I (HIGH SENSITIVITY)  TROPONIN I (HIGH SENSITIVITY)   ____________________________________________  EKG  ED ECG REPORT I, Elizjah Noblet J, the attending physician, personally viewed and interpreted this ECG.   Date: 03/27/2020  EKG Time: 0103  Rate: 66  Rhythm: normal EKG, normal sinus rhythm  Axis: Normal  Intervals:none  ST&T Change:  Nonspecific  ____________________________________________  RADIOLOGY  ED MD interpretation: No ICH; no acute cardiopulmonary process  Official radiology report(s): CT Head Wo Contrast  Result Date: 03/27/2020 CLINICAL DATA:  Status post fall. EXAM: CT HEAD WITHOUT CONTRAST TECHNIQUE: Contiguous axial images were obtained from the base of the skull through the vertex without intravenous contrast. COMPARISON:  February 25, 2020 FINDINGS: Brain: There is mild cerebral atrophy with widening of the extra-axial spaces and ventricular dilatation. There are areas of decreased attenuation within the white matter tracts of the supratentorial brain, consistent with microvascular disease changes. Vascular: No hyperdense vessel or unexpected calcification. Skull: Normal. Negative for fracture or focal lesion. Sinuses/Orbits: No acute finding. Other: None. IMPRESSION: 1. Generalized cerebral atrophy. 2. No acute intracranial abnormality. Electronically Signed   By: Aram Candela M.D.   On: 03/27/2020 02:11   DG Chest Port 1 View  Result Date: 03/27/2020 CLINICAL DATA:  Unresponsive. EXAM: PORTABLE CHEST 1 VIEW COMPARISON:  February 25, 2020 FINDINGS: The heart size is stable. Aortic calcifications are noted. There is an airspace opacity at the left costophrenic angle favored to represent area of atelectasis or scarring. The lung volumes are low. There is no large focal infiltrate or pneumothorax. No large pleural effusion. IMPRESSION: Low lung volumes with atelectasis or scarring at the left costophrenic angle. Electronically Signed   By: Katherine Mantle M.D.   On: 03/27/2020 01:58    ____________________________________________   PROCEDURES  Procedure(s) performed (including Critical Care):  .1-3 Lead EKG Interpretation Performed by: Irean Hong, MD Authorized by: Irean Hong, MD     Interpretation: normal     ECG rate:  65   ECG rate assessment: normal     Rhythm: sinus rhythm     Ectopy: none      Conduction: normal     CRITICAL CARE Performed by: Irean Hong   Total critical care time: 30 minutes  Critical care time was exclusive of separately billable procedures and treating other patients.  Critical care was necessary to treat or prevent imminent or life-threatening deterioration.  Critical care was time spent personally by me on the following activities: development of treatment plan with patient and/or surrogate as well as nursing, discussions with consultants, evaluation of patient's response to treatment, examination of patient, obtaining history from patient or surrogate, ordering and performing treatments and interventions, ordering and review of laboratory studies, ordering and review of radiographic studies, pulse oximetry and re-evaluation of patient's condition.   ____________________________________________   INITIAL IMPRESSION / ASSESSMENT AND PLAN / ED COURSE  As part of my medical decision making, I reviewed the following data within the [place] notes reviewed and incorporated, Labs reviewed, EKG interpreted, Old chart reviewed, Radiograph reviewed, Discussed with admitting physician and Notes from prior ED visits     Ondine Gemme was evaluated in Emergency Department on 03/27/2020 for the symptoms described in the history of present illness. She was evaluated in the context of the global COVID-19 pandemic, which necessitated consideration that the patient might be at risk for infection with the SARS-CoV-2 virus that causes COVID-19. Institutional protocols and algorithms that pertain to the evaluation of patients at risk for COVID-19 are in a state of rapid change based on information released by regulatory bodies including the CDC and federal and state organizations. These policies and algorithms were followed during the patient's care in the ED.    74 year old female found unresponsive at home, presenting to the ED with decreased  LOC. Differential diagnosis includes, but is not limited to, alcohol, illicit or prescription medications, or other toxic ingestion; intracranial pathology such as stroke or intracerebral hemorrhage; fever or infectious causes including sepsis; hypoxemia and/or hypercarbia; uremia; trauma; endocrine related disorders such as diabetes, hypoglycemia, and thyroid-related diseases; hypertensive encephalopathy; etc.  Will obtain lab work, UA, CT head.  Anticipate hospitalization.   Clinical Course as of Mar 27 249  Wed Mar 27, 2020  0241 Patient remains somnolent but arousable to voice.  Laboratory and imaging results noted.  Will discuss with hospitalist services for admission.   [JS]    Clinical Course User Index [JS] Paulette Blanch, MD     ____________________________________________   FINAL CLINICAL IMPRESSION(S) / ED DIAGNOSES  Final diagnoses:  Unresponsive  Altered mental status, unspecified altered mental status type  Lower urinary tract infectious disease     ED Discharge Orders    None       Note:  This document was prepared using Dragon voice recognition software and may include unintentional dictation errors.   Paulette Blanch, MD 03/27/20 [phone]

## 2020-03-27 NOTE — ED Notes (Signed)
Pt sleeping soundly. Sats are stable at 97% on room air, Pt will awake to loud voice or sternal rub. Pt unable to answer most questions.

## 2020-03-27 NOTE — Plan of Care (Signed)

## 2020-03-27 NOTE — H&P (Signed)
History and Physical    Jessica Donovan JAS:[id number] DOB: Feb 14, 1946 DOA: 03/27/2020  PCP: Garlon Hatchet, MD   Patient coming from: home I have personally briefly reviewed patient's old medical records in [facility]  Chief Complaint: found on floor  HPI: Jessica Donovan is a 74 y.o. female with medical history significant for CAD with history of stent angioplasty, hypertension, chronic back pain and anxiety on several psychotropics including alprazolam, cyclobenzaprine, MSIR, pregabalin, promethazine and trazodone, hospitalized in February 2021 with altered mental status related to polypharmacy with negative work-up, seen by cardiologist, Dr. Mariah Milling on 03/11/2020 with no further cardiac work-up deemed necessary at the time, who was brought into the emergency room after being found unresponsive on the floor of her home where she lives alone after the neighbors heard her dog barking incessantly and went to check on her.   ED course: By arrival in the emergency room he was very drowsy and only briefly arousable to her name being called.  Vitals were within normal limits except for soft blood pressure of 101/50.  Blood work unremarkable.  Troponin was 5.  EKG nonacute.  Urinalysis not consistent with UTI.  Urine drug screen showing presence of tricyclic's benzodiazepines and opiates.  Head CT and chest x-ray unremarkable.  Patient was given an IV fluid bolus and hospitalist consulted for admission.  Blood gas pending at time of decision to admit.  Review of Systems: Unable to obtain due to somnolence  Past Medical History:  Diagnosis Date  . Anxiety   . Chronic back pain   . Collagen vascular disease (HCC)   . Coronary artery disease    a. s/p PCI/DES to dRCA & mLCx in 2014 with repeat LHC in 10/2015 showing patent stents  . Diabetes mellitus without complication (HCC)   . Hypertension   . Low back pain   . Myocardial infarction (HCC)   . Osteomyelitis of toe (HCC) 01/23/2016  . Stroke  [facility])     Past Surgical History:  Procedure Laterality Date  . AMPUTATION TOE Left 11/10/2015   Procedure: AMPUTATION TOE;  Surgeon: Linus Galas, MD;  Location: [facility] ORS;  Service: Podiatry;  Laterality: Left;  . AMPUTATION TOE Left 01/24/2016   Procedure: AMPUTATION TOE (2nd mpj);  Surgeon: Linus Galas, DPM;  Location: [facility] ORS;  Service: Podiatry;  Laterality: Left;  . AMPUTATION TOE Right 12/21/2018   Procedure: 2ND TOE AMPUTATION WITH DEBRIDEMENT OF SOFT TISSUE;  Surgeon: Recardo Evangelist, DPM;  Location: [facility] ORS;  Service: Podiatry;  Laterality: Right;  . CARDIAC CATHETERIZATION N/A 11/12/2015   Procedure: Left Heart Cath and Coronary Angiography;  Surgeon: Marcina Millard, MD;  Location: [facility] INVASIVE CV LAB;  Service: Cardiovascular;  Laterality: N/A;  . COLONOSCOPY WITH PROPOFOL N/A 07/20/2017   Procedure: COLONOSCOPY WITH PROPOFOL;  Surgeon: Wyline Mood, MD;  Location: [facility] ENDOSCOPY;  Service: Endoscopy;  Laterality: N/A;  . JOINT REPLACEMENT    . TOE AMPUTATION       reports that she has never smoked. She has never used smokeless tobacco. She reports that she does not drink alcohol or use drugs.  Allergies  Allergen Reactions  . Ampicillin Nausea And Vomiting and Other (See Comments)    Did it involve swelling of the face/tongue/throat, SOB, or low BP? Yes Did it involve sudden or severe rash/hives, skin peeling, or any reaction on the inside of your mouth or nose? No Did you need to seek medical attention at a hospital or doctor's office? No When did it last  happen?>10 years If all above answers are "NO", may proceed with cephalosporin use.   . Bactrim [Sulfamethoxazole-Trimethoprim] Other (See Comments)    N/V/D  . Codeine Nausea And Vomiting  . Flu Virus Vaccine Other (See Comments)    Reaction: Passed out for 12 days  . Influenza Vaccines Other (See Comments)    Reaction:  Caused pt to pass out   . Methadone Hives and Itching  . Oxycodone-Acetaminophen Nausea And  Vomiting  . Percocet [Oxycodone-Acetaminophen] Nausea And Vomiting  . Sulfa Antibiotics Other (See Comments)    Reaction: unknown  . Tetanus Antitoxin Swelling and Other (See Comments)    Reaction: injection site swelling  . Tetanus Toxoids Swelling and Other (See Comments)    Reaction:  Swelling at injection site  . Penicillin G Rash    Has patient had a PCN reaction causing immediate rash, facial/tongue/throat swelling, SOB or lightheadedness with hypotension: Yes Has patient had a PCN reaction causing severe rash involving mucus membranes or skin necrosis: No Has patient had a PCN reaction that required hospitalization: No Has patient had a PCN reaction occurring within the last 10 years: No If all of the above answers are "NO", then may proceed with Cephalosporin use..  . Tetracyclines & Related Rash    Family History  Problem Relation Age of Onset  . CAD Mother   . CAD Father      Prior to Admission medications   Medication Sig Start Date End Date Taking? Authorizing Provider  acetaminophen (TYLENOL) 500 MG tablet Take 1,000 mg by mouth every 8 (eight) hours as needed for mild pain or headache.     [provider]  ALPRAZolam Prudy Feeler) 1 MG tablet Take 1 tablet (1 mg total) by mouth 2 (two) times daily as needed for anxiety. Patient taking differently: Take 1 mg by mouth 3 (three) times daily as needed for anxiety.  02/09/20   Lynn Ito, MD  AMBULATORY NON FORMULARY MEDICATION Compounded estrogen  Apply 1 gram vaginally M,W, Friday [facility] 01/12/20   Vanna Scotland, MD  aspirin 81 MG tablet Take 81 mg by mouth daily.    [provider]  atorvastatin (LIPITOR) 20 MG tablet Take 20 mg by mouth daily.     [provider]  AZO CRANBERRY GUMMIES PO Take by mouth. 2 gummies daily    [provider]  BIOTIN 5000 PO Take by mouth daily.    [provider]  Cyanocobalamin (VITAMIN B-12 PO) Take by mouth daily. 2 gummies daily     [provider]  cyclobenzaprine (FLEXERIL) 10 MG tablet Take 10 mg by mouth 3 (three) times daily as needed.  02/12/20   [provider]  diphenoxylate-atropine (LOMOTIL) 2.5-0.025 MG tablet Take 1 tablet by mouth every 4 (four) hours as needed for diarrhea or loose stools.     [provider]  ferrous sulfate 325 (65 FE) MG tablet Take 325 mg by mouth daily with breakfast.    [provider]  fesoterodine (TOVIAZ) 8 MG TB24 tablet Take 8 mg by mouth daily.    [provider]  HUMALOG KWIKPEN 100 UNIT/ML KwikPen Inject 10 Units into the skin 3 (three) times daily. Pt just on sliding scale with meals 01/19/20   [provider]  insulin aspart (NOVOLOG) 100 UNIT/ML injection Inject 3-15 Units into the skin 3 (three) times daily with meals as needed for high blood sugar. Pt uses as needed per sliding scale:    Less than 140:  0 units  140-180:  3 units 181-220:  4 units 221- 260:  6 units 261- 320:  8 units 321-360:  10 units 361-400:  12 units Greater than 400:  15 units    [provider]  metoprolol tartrate (LOPRESSOR) 25 MG tablet Take 0.5 tablets (12.5 mg total) by mouth 2 (two) times daily. Patient taking differently: Take 25 mg by mouth 2 (two) times daily.  02/09/20   Lynn ItoAmery, Sahar, MD  mirtazapine (REMERON) 15 MG tablet Take 15 mg by mouth at bedtime.    [provider]  morphine (MSIR) 15 MG tablet Take 1 tablet (15 mg total) by mouth 2 (two) times daily. Patient taking differently: Take 15 mg by mouth every 4 (four) hours as needed.  02/09/20   Lynn ItoAmery, Sahar, MD  Multiple Vitamins-Minerals (HAIR SKIN AND NAILS FORMULA PO) Take by mouth daily. 2 gummies daily    [provider]  omeprazole (PRILOSEC) 20 MG capsule Take 20 mg by mouth daily.    [provider]  pregabalin (LYRICA) 150 MG capsule Take 150 mg by mouth in the morning, at noon, and at bedtime.    [provider]  promethazine  (PHENERGAN) 25 MG tablet Take 25 mg by mouth every 4 (four) hours as needed for nausea or vomiting.    [provider]  TOUJEO SOLOSTAR 300 UNIT/ML SOPN Inject 50 Units into the skin daily. 08/08/19   [provider]  traZODone (DESYREL) 100 MG tablet Take 100 mg by mouth at bedtime.    [provider]  venlafaxine XR (EFFEXOR-XR) 75 MG 24 hr capsule Take 75 mg by mouth 3 (three) times daily.    [provider]  Vitamin D, Ergocalciferol, (DRISDOL) 50000 units CAPS capsule Take 50,000 Units by mouth every Sunday.     [provider]    Physical Exam: Vitals:   03/27/20 0101  BP: (!) 101/50  Pulse: 67  Resp: 18  Temp: 98.4 F (36.9 C)  TempSrc: Oral  SpO2: 91%  Weight: 85.9 kg     Vitals:   03/27/20 0101  BP: (!) 101/50  Pulse: 67  Resp: 18  Temp: 98.4 F (36.9 C)  TempSrc: Oral  SpO2: 91%  Weight: 85.9 kg    Constitutional: Lethargic and arousable to shaking but readily falls back asleep Eyes: PERLA, EOMI, irises appear normal, anicteric sclera,  ENMT: external ears and nose appear normal, normal hearing             Lips appears normal, oropharynx mucosa, tongue, posterior pharynx appear normal  Neck: neck appears normal, no masses, normal ROM, no thyromegaly, no JVD  CVS: S1-S2 clear, no murmur rubs or gallops,  , no carotid bruits, pedal pulses palpable, No LE edema Respiratory:  clear to auscultation bilaterally, no wheezing, rales or rhonchi. Respiratory effort normal. No accessory muscle use.  Abdomen: soft nontender, nondistended, normal bowel sounds, no hepatosplenomegaly, no hernias Musculoskeletal: : no cyanosis, clubbing , no contractures or atrophy Neuro: No apparent focal deficit Psych: Unable to assess due to somnolence skin: no rashes or lesions or ulcers, no induration or nodules   Labs on Admission: I have personally reviewed following labs and imaging studies  CBC: Recent Labs  Lab 03/27/20 0126  WBC 10.4   NEUTROABS 6.4  HGB 13.1  HCT 40.2  MCV 98.8  PLT 252   Basic Metabolic Panel: Recent Labs  Lab 03/27/20 0126  NA 140  K 4.6  CL 104  CO2 25  GLUCOSE 226*  BUN 25*  CREATININE 0.85  CALCIUM 9.8   GFR: Estimated Creatinine Clearance: 65 mL/min (by C-G formula based on SCr of 0.85 mg/dL). Liver Function Tests: Recent Labs  Lab 03/27/20 0126  AST 19  ALT 10  ALKPHOS 65  BILITOT 0.5  PROT 6.3*  ALBUMIN 3.7   No results for input(s): LIPASE, AMYLASE in the last 168 hours. Recent Labs  Lab 03/27/20 0126  AMMONIA 35   Coagulation Profile: No results for input(s): INR, PROTIME in the last 168 hours. Cardiac Enzymes: Recent Labs  Lab 03/27/20 0126  CKTOTAL 72   BNP (last 3 results) No results for input(s): PROBNP in the last 8760 hours. HbA1C: No results for input(s): HGBA1C in the last 72 hours. CBG: No results for input(s): GLUCAP in the last 168 hours. Lipid Profile: No results for input(s): CHOL, HDL, LDLCALC, TRIG, CHOLHDL, LDLDIRECT in the last 72 hours. Thyroid Function Tests: No results for input(s): TSH, T4TOTAL, FREET4, T3FREE, THYROIDAB in the last 72 hours. Anemia Panel: No results for input(s): VITAMINB12, FOLATE, FERRITIN, TIBC, IRON, RETICCTPCT in the last 72 hours. Urine analysis:    Component Value Date/Time   COLORURINE YELLOW (A) 03/27/2020 0126   APPEARANCEUR CLEAR (A) 03/27/2020 0126   APPEARANCEUR Cloudy (A) 11/29/2018 1518   LABSPEC 1.028 03/27/2020 0126   LABSPEC 1.014 12/15/2014 0958   PHURINE 5.0 03/27/2020 0126   GLUCOSEU NEGATIVE 03/27/2020 0126   GLUCOSEU Negative 12/15/2014 0958   HGBUR SMALL (A) 03/27/2020 0126   BILIRUBINUR NEGATIVE 03/27/2020 0126   BILIRUBINUR Negative 11/29/2018 1518   BILIRUBINUR Negative 12/15/2014 0958   KETONESUR NEGATIVE 03/27/2020 0126   PROTEINUR NEGATIVE 03/27/2020 0126   NITRITE NEGATIVE 03/27/2020 0126   LEUKOCYTESUR TRACE (A) 03/27/2020 0126   LEUKOCYTESUR 1+ 12/15/2014 0958     Radiological Exams on Admission: CT Head Wo Contrast  Result Date: 03/27/2020 CLINICAL DATA:  Status post fall. EXAM: CT HEAD WITHOUT CONTRAST TECHNIQUE: Contiguous axial images were obtained from the base of the skull through the vertex without intravenous contrast. COMPARISON:  February 25, 2020 FINDINGS: Brain: There is mild cerebral atrophy with widening of the extra-axial spaces and ventricular dilatation. There are areas of decreased attenuation within the white matter tracts of the supratentorial brain, consistent with microvascular disease changes. Vascular: No hyperdense vessel or unexpected calcification. Skull: Normal. Negative for fracture or focal lesion. Sinuses/Orbits: No acute finding. Other: None. IMPRESSION: 1. Generalized cerebral atrophy. 2. No acute intracranial abnormality. Electronically Signed   By: Aram Candela M.D.   On: 03/27/2020 02:11   DG Chest Port 1 View  Result Date: 03/27/2020 CLINICAL DATA:  Unresponsive. EXAM: PORTABLE CHEST 1 VIEW COMPARISON:  February 25, 2020 FINDINGS: The heart size is stable. Aortic calcifications are noted. There is an airspace opacity at the left costophrenic angle favored to represent area of atelectasis or scarring. The lung volumes are low. There is no large focal infiltrate or pneumothorax. No large pleural effusion. IMPRESSION: Low lung volumes with atelectasis or scarring at the left costophrenic angle. Electronically Signed   By: Katherine Mantle M.D.   On: 03/27/2020 01:58    EKG: Independently reviewed.  Assessment/Plan Active Problems:   Unresponsiveness/syncope with collapse   Acute metabolic encephalopathy   Acute hypercapnic respiratory failure with respiratory acidosis   Fall   Polypharmacy -Patient found unresponsive on the floor.Similar to prior hospital encounters  in May 2020, February 2021 -No overt evidence of external/internal injury head CT negative --respiratory acidosis and hypercapnea on  ABG. Troponin  5 -Suspect CO2 narcosis and orthostatic hypotension related to polypharmacy -Hold Flexeril, trazodone, morphine, Xanax, Phenergan until more awake -IV hydration -Fall and aspiration precautions -Follow-up ABG to evaluate for CO2 narcosis --echocardiogram -evaluated by Dr. Rockey Situ, cardiologist in the outpatient setting on 03/11/2020 who did not think that she needed further work-up at the time.  He did wean her off isosorbide due to borderline blood pressures  Hypotension -secondary to psychotropics -Hold psychotropics as well as metoprolol -IV hydration    Type 2 diabetes mellitus with diabetic neuropathy (HCC) -Insulin sliding scale coverage  Coronary artery disease history of stents -EKG showed no acute ST-T wave changes.  Troponin was 5 --Hold metoprolol given soft blood pressure.  Continue aspirin and atorvastatin    DVT prophylaxis: Lovenox  Code Status: full code pending  Confirmation of  Status when patient moe awake.  Family Communication:  none  Disposition Plan: Back to previous home environment Consults called: none  Status:obs    Athena Masse MD [facility]     03/27/2020, 2:56 AM

## 2020-03-27 NOTE — ED Notes (Signed)
Posey alarm in place and turned on.  

## 2020-03-27 NOTE — ED Notes (Signed)
Pt too sedated to tolerate po meds at this time.

## 2020-03-27 NOTE — ED Notes (Signed)
Pt unable to tolerate po meds or orthostatic vitals at this time.

## 2020-03-27 NOTE — ED Notes (Signed)
Pt provided with a breakfast tray.  

## 2020-03-27 NOTE — ED Notes (Signed)
This RN contacted dietary requesting a breakfast tray for pt.

## 2020-03-27 NOTE — Progress Notes (Signed)
PROGRESS NOTE  Brief Narrative: Jessica Donovan is a 74 y.o. female with medical history significant for CAD with history of stent angioplasty, hypertension, chronic back pain and anxiety on several psychotropics including alprazolam, cyclobenzaprine, MSIR, pregabalin, promethazine and trazodone, hospitalized in February 2021 with altered mental status related to polypharmacy with negative work-up, seen by cardiologist, Dr. Mariah Milling on 03/11/2020 with no further cardiac work-up deemed necessary at the time, who was brought into the emergency room after being found unresponsive on the floor of her home where she lives alone after the neighbors heard her dog barking incessantly and went to check on her.    ED course: By arrival in the emergency room he was very drowsy and only briefly arousable to her name being called.  Vitals were within normal limits except for soft blood pressure of 101/50.  Blood work unremarkable.  Troponin was 5.  EKG nonacute.  Urinalysis not consistent with UTI.  Urine drug screen showing presence of tricyclic's benzodiazepines and opiates.  Head CT and chest x-ray unremarkable.  Patient was given an IV fluid bolus and hospitalist consulted for admission.   Hospital Course: Medications have been held and while the patient is more responsive, she returns back to sleep quickly. Though she is more alert, she has not yet returned to her baseline, intermittently confused.   Objective: BP (!) 134/57 (BP Location: Left Arm)   Pulse 72   Temp 98.2 F (36.8 C)   Resp 16   Ht 5\' 6"  (1.676 m)   Wt 85.9 kg   SpO2 93%   BMI 30.57 kg/m   Gen: Nontoxic, drowsy but rousable, able to answer questions appropriately Pulm: Clear and nonlabored  CV: RRR, no murmur, no JVD, no edema  Assessment & Plan: Patient arrived early this morning with unresponsiveness, similar to prior episodes that have resolved by holding her significant burden of sedating/psychotropic medications. Will continue to  hold these medications and monitor mental status. If returns to baseline, can be discharged with directions to follow up with PCP to taper off medications. She is not currently able to be safely discharged.  , MD Pager on amion 03/27/2020, 2:41 PM

## 2020-03-28 DIAGNOSIS — J9602 Acute respiratory failure with hypercapnia: Secondary | ICD-10-CM | POA: Diagnosis not present

## 2020-03-28 DIAGNOSIS — I25118 Atherosclerotic heart disease of native coronary artery with other forms of angina pectoris: Secondary | ICD-10-CM | POA: Diagnosis not present

## 2020-03-28 DIAGNOSIS — W19XXXA Unspecified fall, initial encounter: Secondary | ICD-10-CM

## 2020-03-28 DIAGNOSIS — R4189 Other symptoms and signs involving cognitive functions and awareness: Secondary | ICD-10-CM | POA: Diagnosis not present

## 2020-03-28 DIAGNOSIS — G9341 Metabolic encephalopathy: Secondary | ICD-10-CM | POA: Diagnosis not present

## 2020-03-28 LAB — GLUCOSE, CAPILLARY
Glucose-Capillary: 165 mg/dL — ABNORMAL HIGH (ref 70–99)
Glucose-Capillary: 332 mg/dL — ABNORMAL HIGH (ref 70–99)
Glucose-Capillary: 178 mg/dL — ABNORMAL HIGH (ref 70–99)

## 2020-03-28 LAB — URINE CULTURE: Culture: NO GROWTH

## 2020-03-28 NOTE — Care Management Obs Status (Signed)
MEDICARE OBSERVATION STATUS NOTIFICATION   Patient Details  Name: Jessica Donovan MRN: [id number] Date of Birth: 10-11-1946   Medicare Observation Status Notification Given:  Yes    Tally Mattox A Hager Compston, LCSW 03/28/2020, 10:14 AM

## 2020-03-28 NOTE — Discharge Summary (Signed)
Physician Discharge Summary  Jessica Donovan NIO:[id number] DOB: 06-Jun-1946 DOA: 03/27/2020  PCP: Robyn Haber, MD  Admit date: 03/27/2020 Discharge date: 03/28/2020  Admitted From: Home Disposition: Home   Recommendations for Outpatient Follow-up:  1. Follow up with PCP in 1-2 weeks with aim at decreasing multiple sedating medications.   Home Health: CSW has reached out to [facility], [facility], [facility], and [facility] all of whom are unable to service pt due to insurance, or no available staff. Pt is refusing outpatient hh stating its too much for her friends to have to take her. CSW discuss with pt and suggest pt follow up with her PCP in the next week to discuss agency availability at that time. Equipment/Devices: None new Discharge Condition: Stable CODE STATUS: Full Diet recommendation: Heart healthy  Brief/Interim Summary: Jessica Donovan a 74 y.o.femalewith medical history significant forCAD with history of stent angioplasty, hypertension, chronic back pain and anxiety on several psychotropics including alprazolam, cyclobenzaprine, MSIR, pregabalin, promethazine and trazodone, hospitalized in February 2021 with altered mental status related to polypharmacy with negative work-up, seen by cardiologist, Dr. Rockey Situ on 03/11/2020 with nofurther cardiacwork-up deemed necessary at the time, who was brought into the emergency room after being found unresponsive on the floor of her home where she lives alone after the neighbors heard her dog barking incessantly and went to check on her.   ED course: By arrival in the emergency room he was very drowsy and only briefly arousable to her name being called. Vitals were within normal limits except for soft blood pressure of 101/50. Blood work unremarkable. Troponin was 5. EKG nonacute. Urinalysis not consistent with UTI. Urine drug screen showing presence of tricyclic's benzodiazepines and opiates. Head CT and chest  x-ray unremarkable. Patient was given an IV fluid bolus and hospitalist consulted for admission.   Hospital Course: Medications have been held with significant improvement over the course of 24 hours back to fully alert and oriented baseline. She reports dysuria for which fosfomycin was given though urine culture is negative.   Discharge Diagnoses:  Active Problems:   Polypharmacy   CAD (coronary artery disease)   Fall   Unresponsiveness   Acute metabolic encephalopathy   Unresponsive   Acute hypercapnic respiratory failure (HCC)   Unresponsiveness/syncope with collapse   Acute metabolic encephalopathy   Acute hypercapnic respiratory failure with respiratory acidosis   Fall   Polypharmacy -Patient found unresponsive on the floor.Similar to prior hospital encounters  in May 2020, February 2021 -No overt evidence of external/internal injury head CT negative --respiratory acidosis and hypercapnea on ABG. Troponin 5 -Suspect CO2 narcosis and orthostatic hypotension related to polypharmacy -Held Flexeril, trazodone, morphine, Xanax, Phenergan   Hypotension: Resolved by holding medications.  -evaluated by Dr. Rockey Situ, cardiologist in the outpatient setting on 03/11/2020 who did not think that she needed further work-up at the time.  He did wean her off isosorbide due to borderline blood pressures  Type 2 diabetes mellitus with diabetic neuropathy: Continue home medications.  Coronary artery disease history of stents: EKG showed no acute ST-T wave changes.  Troponin was 5 - Continue home medications.   Discharge Instructions Discharge Instructions    Diet - low sodium heart healthy   Complete by: As directed    Discharge instructions   Complete by: As directed    You were admitted after an episode of unresponsiveness. You remained drowsy for many hours after you were brought in though your mental status has normalized as we've held your medications. Due  to your severe chronic pain  and history of back surgeries, you are on many high risk medications and it is recommended that you follow up with your doctor as soon as is reasonable to discuss changing the dosing. It's possible that the combination of these medications makes you at a very high risk for falling and respiratory depression causing death, so it's recommended that you take lower dose  of those medications as we discussed. You were given an antibiotic for a urinary tract infection, though your urine culture grew no bacteria. If your symptoms return, seek medical attention right away.   Increase activity slowly   Complete by: As directed      Allergies as of 03/28/2020      Reactions   Amoxicillin Diarrhea   Ampicillin Nausea And Vomiting, Other (See Comments)   Did it involve swelling of the face/tongue/throat, SOB, or low BP? Yes Did it involve sudden or severe rash/hives, skin peeling, or any reaction on the inside of your mouth or nose? No Did you need to seek medical attention at a hospital or doctor's office? No When did it last happen?>10 years If all above answers are "NO", may proceed with cephalosporin use.   Augmentin [amoxicillin-pot Clavulanate] Diarrhea   Bactrim [sulfamethoxazole-trimethoprim] Other (See Comments)   N/V/D   Codeine Nausea And Vomiting   Flu Virus Vaccine Other (See Comments)   Reaction: Passed out for 12 days   Influenza Vaccines Other (See Comments)   Reaction:  Caused pt to pass out    Methadone Hives, Itching   Oxycodone-acetaminophen Nausea And Vomiting   Percocet [oxycodone-acetaminophen] Nausea And Vomiting   Sulfa Antibiotics Other (See Comments)   Reaction: unknown   Tetanus Antitoxin Swelling, Other (See Comments)   Reaction: injection site swelling   Tetanus Toxoids Swelling, Other (See Comments)   Reaction:  Swelling at injection site   Penicillin G Rash   Has patient had a PCN reaction causing immediate rash, facial/tongue/throat swelling, SOB or lightheadedness  with hypotension: Yes Has patient had a PCN reaction causing severe rash involving mucus membranes or skin necrosis: No Has patient had a PCN reaction that required hospitalization: No Has patient had a PCN reaction occurring within the last 10 years: No If all of the above answers are "NO", then may proceed with Cephalosporin use..   Tetracyclines & Related Rash      Medication List    TAKE these medications   acetaminophen 500 MG tablet Commonly known as: TYLENOL Take 1,000 mg by mouth every 8 (eight) hours as needed for mild pain or headache.   ALPRAZolam 1 MG tablet Commonly known as: XANAX Take 1 tablet (1 mg total) by mouth 2 (two) times daily as needed for anxiety. What changed: when to take this   AMBULATORY NON FORMULARY MEDICATION Compounded estrogen  Apply 1 gram vaginally M,W, [facility]   aspirin 81 MG tablet Take 81 mg by mouth daily.   atorvastatin 20 MG tablet Commonly known as: LIPITOR Take 20 mg by mouth daily.   AZO CRANBERRY GUMMIES PO Take by mouth. 2 gummies daily   Biotin 1 MG Caps Take 1 mg by mouth as directed.   cyclobenzaprine 10 MG tablet Commonly known as: FLEXERIL Take 10 mg by mouth 3 (three) times daily as needed.   diphenoxylate-atropine 2.5-0.025 MG tablet Commonly known as: LOMOTIL Take 1 tablet by mouth every 4 (four) hours as needed for diarrhea or loose stools.   ferrous sulfate 325 (65 FE)  MG tablet Take 325 mg by mouth daily with breakfast.   HAIR SKIN AND NAILS FORMULA PO Take by mouth daily. 2 gummies daily   HumaLOG KwikPen 100 UNIT/ML KwikPen Generic drug: insulin lispro Inject 10 Units into the skin 3 (three) times daily. Pt just on sliding scale with meals   insulin aspart 100 UNIT/ML injection Commonly known as: novoLOG Inject 3-15 Units into the skin 3 (three) times daily with meals as needed for high blood sugar. Pt uses as needed per sliding scale:    Less than 140:  0 units  140-180:  3  units 181-220:  4 units 221- 260:  6 units 261- 320:  8 units 321-360:  10 units 361-400:  12 units Greater than 400:  15 units   metoprolol tartrate 25 MG tablet Commonly known as: LOPRESSOR Take 0.5 tablets (12.5 mg total) by mouth 2 (two) times daily. What changed: how much to take   mirtazapine 15 MG tablet Commonly known as: REMERON Take 15 mg by mouth at bedtime.   morphine 15 MG tablet Commonly known as: MSIR Take 1 tablet (15 mg total) by mouth 2 (two) times daily. What changed:   when to take this  reasons to take this   omeprazole 20 MG capsule Commonly known as: PRILOSEC Take 20 mg by mouth daily. Notes to patient: Next dose tonight    pregabalin 150 MG capsule Commonly known as: LYRICA Take 150 mg by mouth in the morning, at noon, and at bedtime.   promethazine 25 MG tablet Commonly known as: PHENERGAN Take 25 mg by mouth every 4 (four) hours as needed for nausea or vomiting.   Toujeo SoloStar 300 UNIT/ML Solostar Pen Generic drug: insulin glargine (1 Unit Dial) Inject 50 Units into the skin daily.   Toviaz 8 MG Tb24 tablet Generic drug: fesoterodine Take 8 mg by mouth daily.   traZODone 100 MG tablet Commonly known as: DESYREL Take 100 mg by mouth at bedtime.   venlafaxine XR 75 MG 24 hr capsule Commonly known as: EFFEXOR-XR Take 75 mg by mouth 3 (three) times daily.   VITAMIN B-12 PO Take by mouth daily. 2 gummies daily   Vitamin D (Ergocalciferol) 1.25 MG (50000 UNIT) Caps capsule Commonly known as: DRISDOL Take 50,000 Units by mouth every Sunday.      Follow-up Information    Garlon Hatchet, MD. Go on 04/10/2020.   Specialty: Family Medicine Why: Appointment at 11:15am         Allergies  Allergen Reactions  . Amoxicillin Diarrhea  . Ampicillin Nausea And Vomiting and Other (See Comments)    Did it involve swelling of the face/tongue/throat, SOB, or low BP? Yes Did it involve sudden or severe rash/hives, skin peeling, or  any reaction on the inside of your mouth or nose? No Did you need to seek medical attention at a hospital or doctor's office? No When did it last happen?>10 years If all above answers are "NO", may proceed with cephalosporin use.   . Augmentin [Amoxicillin-Pot Clavulanate] Diarrhea  . Bactrim [Sulfamethoxazole-Trimethoprim] Other (See Comments)    N/V/D  . Codeine Nausea And Vomiting  . Flu Virus Vaccine Other (See Comments)    Reaction: Passed out for 12 days  . Influenza Vaccines Other (See Comments)    Reaction:  Caused pt to pass out   . Methadone Hives and Itching  . Oxycodone-Acetaminophen Nausea And Vomiting  . Percocet [Oxycodone-Acetaminophen] Nausea And Vomiting  . Sulfa Antibiotics Other (See Comments)  Reaction: unknown  . Tetanus Antitoxin Swelling and Other (See Comments)    Reaction: injection site swelling  . Tetanus Toxoids Swelling and Other (See Comments)    Reaction:  Swelling at injection site  . Penicillin G Rash    Has patient had a PCN reaction causing immediate rash, facial/tongue/throat swelling, SOB or lightheadedness with hypotension: Yes Has patient had a PCN reaction causing severe rash involving mucus membranes or skin necrosis: No Has patient had a PCN reaction that required hospitalization: No Has patient had a PCN reaction occurring within the last 10 years: No If all of the above answers are "NO", then may proceed with Cephalosporin use..  . Tetracyclines & Related Rash    Consultations:  None  Procedures/Studies: CT Head Wo Contrast  Result Date: 03/27/2020 CLINICAL DATA:  Status post fall. EXAM: CT HEAD WITHOUT CONTRAST TECHNIQUE: Contiguous axial images were obtained from the base of the skull through the vertex without intravenous contrast. COMPARISON:  February 25, 2020 FINDINGS: Brain: There is mild cerebral atrophy with widening of the extra-axial spaces and ventricular dilatation. There are areas of decreased attenuation within the white  matter tracts of the supratentorial brain, consistent with microvascular disease changes. Vascular: No hyperdense vessel or unexpected calcification. Skull: Normal. Negative for fracture or focal lesion. Sinuses/Orbits: No acute finding. Other: None. IMPRESSION: 1. Generalized cerebral atrophy. 2. No acute intracranial abnormality. Electronically Signed   By: Aram Candela M.D.   On: 03/27/2020 02:11   DG Chest Port 1 View  Result Date: 03/27/2020 CLINICAL DATA:  Unresponsive. EXAM: PORTABLE CHEST 1 VIEW COMPARISON:  February 25, 2020 FINDINGS: The heart size is stable. Aortic calcifications are noted. There is an airspace opacity at the left costophrenic angle favored to represent area of atelectasis or scarring. The lung volumes are low. There is no large focal infiltrate or pneumothorax. No large pleural effusion. IMPRESSION: Low lung volumes with atelectasis or scarring at the left costophrenic angle. Electronically Signed   By: Katherine Mantle M.D.   On: 03/27/2020 01:58   ECHOCARDIOGRAM COMPLETE  Result Date: 03/27/2020    ECHOCARDIOGRAM REPORT   Patient Name:   LAELANI Capron Date of Exam: 03/27/2020 Medical Rec #:  [id number]     Height:       66.0 in Accession #:    [id number]    Weight:       189.4 lb Date of Birth:  08-15-1946     BSA:          1.954 m Patient Age:    73 years      BP:           115/57 mmHg Patient Gender: F             HR:           69 bpm. Exam Location:  [facility] Procedure: 2D Echo Indications:     R55 Syncope  History:         Patient has prior history of Echocardiogram examinations.                  Previous Myocardial Infarction, Stroke; Risk                  Factors:Hypertension and Diabetes.  Sonographer:     L Thornton-Maynard Referring Phys:  [id number] Andris Baumann Diagnosing Phys: Debbe Odea MD IMPRESSIONS  1. Left ventricular ejection fraction, by estimation, is 55 to 60%. The left ventricle has normal function. The left ventricle has  no regional wall motion  abnormalities. Left ventricular diastolic parameters are indeterminate.  2. Right ventricular systolic function is normal. The right ventricular size is normal.  3. The mitral valve is grossly normal. Mild mitral valve regurgitation. No evidence of mitral stenosis.  4. The aortic valve is normal in structure. Aortic valve regurgitation is not visualized. No aortic stenosis is present. FINDINGS  Left Ventricle: Left ventricular ejection fraction, by estimation, is 55 to 60%. The left ventricle has normal function. The left ventricle has no regional wall motion abnormalities. The left ventricular internal cavity size was normal in size. There is  no left ventricular hypertrophy. Left ventricular diastolic parameters are indeterminate. Right Ventricle: The right ventricular size is normal. Right vetricular wall thickness was not assessed. Right ventricular systolic function is normal. Left Atrium: Left atrial size was normal in size. Right Atrium: Right atrial size was normal in size. Pericardium: There is no evidence of pericardial effusion. Mitral Valve: The mitral valve is grossly normal. Normal mobility of the mitral valve leaflets. Mild mitral valve regurgitation. No evidence of mitral valve stenosis. MV peak gradient, 4.1 mmHg. The mean mitral valve gradient is 2.0 mmHg. Tricuspid Valve: The tricuspid valve is normal in structure. Tricuspid valve regurgitation is not demonstrated. No evidence of tricuspid stenosis. Aortic Valve: The aortic valve is normal in structure. Aortic valve regurgitation is not visualized. No aortic stenosis is present. Aortic valve mean gradient measures 3.0 mmHg. Aortic valve peak gradient measures 5.3 mmHg. Aortic valve area, by VTI measures 3.68 cm. Pulmonic Valve: The pulmonic valve was not well visualized. Pulmonic valve regurgitation is not visualized. No evidence of pulmonic stenosis. Aorta: The aortic root is normal in size and structure. Venous: The inferior vena cava was not  well visualized. IAS/Shunts: No atrial level shunt detected by color flow Doppler.  LEFT VENTRICLE PLAX 2D LVIDd:         4.36 cm     Diastology LVIDs:         2.05 cm     LV e' lateral:   5.33 cm/s LV PW:         1.11 cm     LV E/e' lateral: 14.6 LV IVS:        2.41 cm     LV e' medial:    4.79 cm/s LVOT diam:     2.10 cm     LV E/e' medial:  16.2 LV SV:         90 LV SV Index:   46 LVOT Area:     3.46 cm  LV Volumes (MOD) LV vol d, MOD A2C: 45.2 ml LV vol d, MOD A4C: 58.2 ml LV vol s, MOD A2C: 9.8 ml LV vol s, MOD A4C: 12.5 ml LV SV MOD A2C:     35.4 ml LV SV MOD A4C:     58.2 ml LV SV MOD BP:      39.5 ml RIGHT VENTRICLE RV S prime:     12.90 cm/s TAPSE (M-mode): 1.9 cm LEFT ATRIUM             Index LA Vol (A2C):   33.6 ml 17.20 ml/m LA Vol (A4C):   58.8 ml 30.09 ml/m LA Biplane Vol: 46.7 ml 23.90 ml/m  AORTIC VALVE AV Area (Vmax):    3.31 cm AV Area (Vmean):   3.20 cm AV Area (VTI):     3.68 cm AV Vmax:           115.00 cm/s AV  Vmean:          79.700 cm/s AV VTI:            0.244 m AV Peak Grad:      5.3 mmHg AV Mean Grad:      3.0 mmHg LVOT Vmax:         110.00 cm/s LVOT Vmean:        73.700 cm/s LVOT VTI:          0.259 m LVOT/AV VTI ratio: 1.06  AORTA Ao Root diam: 3.60 cm MITRAL VALVE               TRICUSPID VALVE MV Area (PHT): 3.28 cm    TR Peak grad:   14.9 mmHg MV Peak grad:  4.1 mmHg    TR Vmax:        193.00 cm/s MV Mean grad:  2.0 mmHg MV Vmax:       1.01 m/s    SHUNTS MV Vmean:      70.3 cm/s   Systemic VTI:  0.26 m MV E velocity: 77.60 cm/s  Systemic Diam: 2.10 cm MV A velocity: 77.60 cm/s MV E/A ratio:  1.00 Debbe OdeaBrian Agbor-Etang MD Electronically signed by Debbe OdeaBrian Agbor-Etang MD Signature Date/Time: 03/27/2020/3:18:32 PM    Final        Subjective: Patient is back to mental baseline but feels diffusely weak. She's able to get around without dizziness, loss of balance. Doesn't believe she takes too many medications "even though that's what they always say." Disappointed she only got 1 dose  of fosfomycin despite explanation of typical dosing schedule.  Discharge Exam: Vitals:   03/28/20 0527 03/28/20 0731  BP: (!) 152/74 (!) 162/67  Pulse: 76 75  Resp:  17  Temp: 98 F (36.7 C) 97.8 F (36.6 C)  SpO2: 92% 94%   General: Pt is alert, awake, not in acute distress Cardiovascular: RRR, S1/S2 +, no rubs, no gallops Respiratory: CTA bilaterally, no wheezing, no rhonchi Abdominal: Soft, NT, ND, bowel sounds + Extremities: No edema, no cyanosis Neuro: Alert, oriented, no focal weakness or numbness Psych: No AVH  Labs: BNP (last 3 results) Recent Labs    02/09/20 0749 02/25/20 1237  BNP 189.0* 198.0*   Basic Metabolic Panel: Recent Labs  Lab 03/27/20 0126  NA 140  K 4.6  CL 104  CO2 25  GLUCOSE 226*  BUN 25*  CREATININE 0.85  CALCIUM 9.8   Liver Function Tests: Recent Labs  Lab 03/27/20 0126  AST 19  ALT 10  ALKPHOS 65  BILITOT 0.5  PROT 6.3*  ALBUMIN 3.7   No results for input(s): LIPASE, AMYLASE in the last 168 hours. Recent Labs  Lab 03/27/20 0126  AMMONIA 35   CBC: Recent Labs  Lab 03/27/20 0126  WBC 10.4  NEUTROABS 6.4  HGB 13.1  HCT 40.2  MCV 98.8  PLT 252   Cardiac Enzymes: Recent Labs  Lab 03/27/20 0126  CKTOTAL 72   BNP: Invalid input(s): POCBNP CBG: Recent Labs  Lab 03/27/20 1626 03/27/20 2051 03/28/20 0733  GLUCAP 220* 229* 165*   D-Dimer No results for input(s): DDIMER in the last 72 hours. Hgb A1c No results for input(s): HGBA1C in the last 72 hours. Lipid Profile No results for input(s): CHOL, HDL, LDLCALC, TRIG, CHOLHDL, LDLDIRECT in the last 72 hours. Thyroid function studies No results for input(s): TSH, T4TOTAL, T3FREE, THYROIDAB in the last 72 hours.  Invalid input(s): FREET3 Anemia work up No results for input(s):  VITAMINB12, FOLATE, FERRITIN, TIBC, IRON, RETICCTPCT in the last 72 hours. Urinalysis    Component Value Date/Time   COLORURINE YELLOW (A) 03/27/2020 0126   APPEARANCEUR CLEAR (A)  03/27/2020 0126   APPEARANCEUR Cloudy (A) 11/29/2018 1518   LABSPEC 1.028 03/27/2020 0126   LABSPEC 1.014 12/15/2014 0958   PHURINE 5.0 03/27/2020 0126   GLUCOSEU NEGATIVE 03/27/2020 0126   GLUCOSEU Negative 12/15/2014 0958   HGBUR SMALL (A) 03/27/2020 0126   BILIRUBINUR NEGATIVE 03/27/2020 0126   BILIRUBINUR Negative 11/29/2018 1518   BILIRUBINUR Negative 12/15/2014 0958   KETONESUR NEGATIVE 03/27/2020 0126   PROTEINUR NEGATIVE 03/27/2020 0126   NITRITE NEGATIVE 03/27/2020 0126   LEUKOCYTESUR TRACE (A) 03/27/2020 0126   LEUKOCYTESUR 1+ 12/15/2014 0958    Microbiology Recent Results (from the past 240 hour(s))  Respiratory Panel by RT PCR (Flu A&B, Covid) - Urine, Clean Catch     Status: None   Collection Time: 03/27/20  1:26 AM   Specimen: Urine, Clean Catch  Result Value Ref Range Status   SARS Coronavirus 2 by RT PCR NEGATIVE NEGATIVE Final    Comment: (NOTE) SARS-CoV-2 target nucleic acids are NOT DETECTED. The SARS-CoV-2 RNA is generally detectable in upper respiratoy specimens during the acute phase of infection. The lowest concentration of SARS-CoV-2 viral copies this assay can detect is 131 copies/mL. A negative result does not preclude SARS-Cov-2 infection and should not be used as the sole basis for treatment or other patient management decisions. A negative result may occur with  improper specimen collection/handling, submission of specimen other than nasopharyngeal swab, presence of viral mutation(s) within the areas targeted by this assay, and inadequate number of viral copies (<131 copies/mL). A negative result must be combined with clinical observations, patient history, and epidemiological information. The expected result is Negative. Fact Sheet for Patients:  https://www.moore.com/ Fact Sheet for Healthcare Providers:  https://www.young.biz/ This test is not yet ap proved or cleared by the [place] FDA and  has  been authorized for detection and/or diagnosis of SARS-CoV-2 by FDA under an Emergency Use Authorization (EUA). This EUA will remain  in effect (meaning this test can be used) for the duration of the COVID-19 declaration under Section 564(b)(1) of the Act, 21 U.S.C. section 360bbb-3(b)(1), unless the authorization is terminated or revoked sooner.    Influenza A by PCR NEGATIVE NEGATIVE Final   Influenza B by PCR NEGATIVE NEGATIVE Final    Comment: (NOTE) The Xpert Xpress SARS-CoV-2/FLU/RSV assay is intended as an aid in  the diagnosis of influenza from Nasopharyngeal swab specimens and  should not be used as a sole basis for treatment. Nasal washings and  aspirates are unacceptable for Xpert Xpress SARS-CoV-2/FLU/RSV  testing. Fact Sheet for Patients: https://www.moore.com/ Fact Sheet for Healthcare Providers: https://www.young.biz/ This test is not yet approved or cleared by the [place] FDA and  has been authorized for detection and/or diagnosis of SARS-CoV-2 by  FDA under an Emergency Use Authorization (EUA). This EUA will remain  in effect (meaning this test can be used) for the duration of the  Covid-19 declaration under Section 564(b)(1) of the Act, 21  U.S.C. section 360bbb-3(b)(1), unless the authorization is  terminated or revoked. Performed at [facility], [address] [place], [place] [address]   Urine culture     Status: None   Collection Time: 03/27/20  1:26 AM   Specimen: Urine, Random  Result Value Ref Range Status   Specimen Description   Final    URINE, RANDOM Performed  at [facility], [address] [place], [place] [address]    Special Requests   Final    NONE Performed at [facility], [address], [place], [place] [address]    Culture   Final    NO GROWTH Performed at [facility], [address] [place], [place] [address]    Report Status 03/28/2020 FINAL  Final     Time coordinating discharge: Approximately 40 minutes  Tyrone Nine, MD  [facility] 03/28/2020, 11:04 AM

## 2020-03-28 NOTE — TOC Initial Note (Signed)
Transition of Care [facility]) - Initial/Assessment Note    Patient Details  Name: Jessica Donovan MRN: [id number] Date of Birth: 1946/08/20  Transition of Care [facility]) CM/SW Contact:    Maree Krabbe, LCSW Phone Number: 03/28/2020, 2:43 PM  Clinical Narrative:  CSW spoke with pt at bedside. Pt states she was found down at home by her neighbors. Pt states she doesn't think she is ready to be d/c. Pt states she is not going to SNF however, would be open to [facility]. Pt has had HH in the past. CSW has reached out to [facility], [facility], [facility], [facility], [facility], and [facility] all of whom are unable to service pt due to insurance, or no available staff. Pt is refusing outpatient hh stating its too much for her friends to have to take her. CSW discuss with pt and suggest pt follow up with her PCP in the next week to discuss agency availability at that time.           Expected Discharge Plan: Home/Self Care Barriers to Discharge: No Barriers Identified   Patient Goals and CMS Choice Patient states their goals for this hospitalization and ongoing recovery are:: to get better   Choice offered to / list presented to : Patient  Expected Discharge Plan and Services Expected Discharge Plan: Home/Self Care     Post Acute Care Choice: Home Health Living arrangements for the past 2 months: Single Family Home Expected Discharge Date: 03/28/20                                    Prior Living Arrangements/Services Living arrangements for the past 2 months: Single Family Home Lives with:: Self Patient language and need for interpreter reviewed:: Yes        Need for Family Participation in Patient Care: Yes (Comment) Care giver support system in place?: Yes (comment)   Criminal Activity/Legal Involvement Pertinent to Current Situation/Hospitalization: No - Comment as needed  Activities of Daily Living Home Assistive Devices/Equipment: Dan Humphreys (specify type) ADL Screening (condition at time of  admission) Patient's cognitive ability adequate to safely complete daily activities?: No Is the patient deaf or have difficulty hearing?: No Does the patient have difficulty seeing, even when wearing glasses/contacts?: No Does the patient have difficulty concentrating, remembering, or making decisions?: No Patient able to express need for assistance with ADLs?: Yes Does the patient have difficulty dressing or bathing?: Yes Independently performs ADLs?: Yes (appropriate for developmental age) Does the patient have difficulty walking or climbing stairs?: Yes Weakness of Legs: None Weakness of Arms/Hands: None  Permission Sought/Granted                  Emotional Assessment Appearance:: Appears stated age Attitude/Demeanor/Rapport: Engaged Affect (typically observed): Accepting, Appropriate Orientation: : Oriented to  Time, Oriented to Place, Oriented to Self, Oriented to Situation Alcohol / Substance Use: Not Applicable Psych Involvement: No (comment)  Admission diagnosis:  Lower urinary tract infectious disease [N39.0] Unresponsive [R41.89] Altered mental status, unspecified altered mental status type [R41.82] Patient Active Problem List   Diagnosis Date Noted  . Unresponsiveness 03/27/2020  . Acute metabolic encephalopathy 03/27/2020  . Unresponsive 03/27/2020  . Acute hypercapnic respiratory failure (HCC) 03/27/2020  . Change in mental status 02/08/2020  . Pressure injury of skin 10/23/2019  . Fall   . HTN (hypertension) 10/20/2019  . Right foot infection 12/19/2018  . Acute encephalopathy 07/06/2018  . Anxiety 06/10/2017  .  Chest pain with moderate risk for cardiac etiology 06/09/2017  . UTI (urinary tract infection) 05/19/2017  . CAD (coronary artery disease) 05/19/2017  . Chronic narcotic use 05/19/2017  . Cellulitis in diabetic foot ([place]) 05/19/2017  . Opioid overdose ([place]) 02/23/2017  . Altered mental status 02/23/2017  . Acute colitis 01/19/2017  . Sepsis  ([place]) 01/17/2017  . Cellulitis of fourth toe of right foot 04/10/2016  . Somnolence 04/09/2016  . Dementia ([place]) 04/09/2016  . Cellulitis 04/03/2016  . Hematoma of right parietal scalp 02/23/2016  . Multiple falls 02/23/2016  . Physical debility 02/23/2016  . Type II diabetes mellitus, uncontrolled ([place]) 02/23/2016  . Syncope and collapse 02/23/2016  . Polypharmacy 02/23/2016  . Osteomyelitis of toe ([place]) 01/23/2016  . Rhabdomyolysis 12/07/2015  . ARF (acute renal failure) ([place]) 11/29/2015  . Hypotension 11/29/2015  . 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 Diabetic osteomyelitis ([place]) 11/07/2015  . Angina effort 11/07/2015  . Osteomyelitis ([place]) 11/07/2015  . Anemia due to chronic blood loss 06/14/2015  . Generalized anxiety disorder 06/14/2015  . Lumbago with sciatica 06/14/2015  . Moderate recurrent major depression ([place]) 06/14/2015  . 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 Abnormal weight loss 05/18/2014  . Chronic pain syndrome 05/18/2014  . 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 Diabetic polyneuropathy ([place]) 05/18/2014  . Type 2 diabetes mellitus with diabetic neuropathy ([place]) 05/07/2014  . Hemorrhage of rectum and anus 01/17/2014  . Cellulitis and abscess of foot excluding toe 05/18/2012   PCP:  Robyn Haber, MD Pharmacy:   [facility] [place], [place] [address] [address] Phone: [phone] Fax: [place], [place] [address] [place] [address] Phone: [phone] Fax: [phone]  [place], [place] [address] Phone: [phone] Fax: [phone]  [place], [place] [facility] [address] [place] [address] [place] [address] Phone:  [phone] Fax: [phone]     Social Determinants of Health ([place]) Interventions    Readmission Risk Interventions No flowsheet data found.

## 2020-03-28 NOTE — Evaluation (Signed)
Physical Therapy Evaluation Patient Details Name: Jessica Donovan MRN: [id number] DOB: Aug 04, 1946 Today's Date: 03/28/2020   History of Present Illness  Pt is a 74 yo female that was brought to the ED after being found unresponsive at home. PMH of CAD with stent angioplasty, HTN, chronic back pain, stoke, several toe amputations, anxiety, hospital admission in Feb 2021 due to AMS related to polypharmacy.    Clinical Impression  Pt on commode at start of session, demonstrated transfers with RW and supervision/CGA. Reported she lives alone but has neighbors that assist with groceries, dog care, and errands as needed. Pt stated she uses her rollator at baseline, has had at least 5 falls in the last 6 months.   The patient ambulated ~21ft total with RW and CGA/supervision, cued for safer turns but overall no LOB noted, some general unsteadiness. Exhibited some weakness of LEs. Able to sit EOB and answer all PLOF with good balance and brush teeth during conversation without assistance.  Overall the patient demonstrated mild deficits (see "PT Problem List") that impede the patient's functional abilities, safety, and mobility and would benefit from skilled PT intervention. Recommendation is HHPT with intermittent supervision.     Follow Up Recommendations Home health PT;Supervision - Intermittent    Equipment Recommendations  None recommended by PT    Recommendations for Other Services       Precautions / Restrictions Precautions Precautions: Fall Precaution Comments: watch HR Restrictions Weight Bearing Restrictions: No      Mobility  Bed Mobility Overal bed mobility: Modified Independent                Transfers Overall transfer level: Needs assistance Equipment used: Rolling walker (2 wheeled) Transfers: Sit to/from Stand Sit to Stand: Supervision;Min guard            Ambulation/Gait Ambulation/Gait assistance: Supervision;Min guard Gait Distance (Feet): 40  Feet Assistive device: Rolling walker (2 wheeled)       General Gait Details: decreased gait velocity, cautious. no LOB noted but some general unsteadiness.  reliant on UE support  Stairs            Wheelchair Mobility    Modified Rankin (Stroke Patients Only)       Balance Overall balance assessment: Needs assistance Sitting-balance support: Feet supported Sitting balance-Leahy Scale: Good       Standing balance-Leahy Scale: Fair                               Pertinent Vitals/Pain Pain Assessment: No/denies pain    Home Living Family/patient expects to be discharged to:: Private residence Living Arrangements: Alone Available Help at Discharge: Available PRN/intermittently;Neighbor;Friend(s) Type of Home: Apartment Home Access: Level entry     Home Layout: One level Home Equipment: Walker - 2 wheels;Walker - 4 wheels;Cane - single point;Wheelchair - power;Other (comment);Tub bench;Shower seat Additional Comments: Reported at least 5 falls in the last 6 months    Prior Function Level of Independence: Needs assistance   Gait / Transfers Assistance Needed: uses rollator at baseline.  ADL's / Homemaking Assistance Needed: dresses/bathes mod I. Neighbors assist with pts dog, groceries, errands        Hand Dominance   Dominant Hand: Right    Extremity/Trunk Assessment   Upper Extremity Assessment Upper Extremity Assessment: Generalized weakness    Lower Extremity Assessment Lower Extremity Assessment: RLE deficits/detail;LLE deficits/detail RLE Deficits / Details: grossly 4-/5 LLE Deficits / Details: grossly 4-/5  Cervical / Trunk Assessment Cervical / Trunk Assessment: Normal  Communication   Communication: No difficulties  Cognition Arousal/Alertness: Awake/alert Behavior During Therapy: WFL for tasks assessed/performed Overall Cognitive Status: Within Functional Limits for tasks assessed                                         General Comments      Exercises     Assessment/Plan    PT Assessment Patient needs continued PT services  PT Problem List Decreased strength;Decreased mobility;Decreased range of motion;Decreased activity tolerance;Decreased balance;Pain;Decreased knowledge of use of DME;Decreased safety awareness       PT Treatment Interventions DME instruction;Therapeutic exercise;Gait training;Balance training;Stair training;Neuromuscular re-education;Functional mobility training;Therapeutic activities;Patient/family education    PT Goals (Current goals can be found in the Care Plan section)  Acute Rehab PT Goals Patient Stated Goal: to get stronger PT Goal Formulation: With patient Time For Goal Achievement: 04/11/20 Potential to Achieve Goals: Good    Frequency Min 2X/week   Barriers to discharge        Co-evaluation               AM-PAC PT "6 Clicks" Mobility  Outcome Measure Help needed turning from your back to your side while in a flat bed without using bedrails?: None Help needed moving from lying on your back to sitting on the side of a flat bed without using bedrails?: None Help needed moving to and from a bed to a chair (including a wheelchair)?: None Help needed standing up from a chair using your arms (e.g., wheelchair or bedside chair)?: None Help needed to walk in hospital room?: A Little Help needed climbing 3-5 steps with a railing? : A Little 6 Click Score: 22    End of Session Equipment Utilized During Treatment: Gait belt Activity Tolerance: Patient tolerated treatment well Patient left: in bed;with call bell/phone within reach(CM at bedside) Nurse Communication: Mobility status PT Visit Diagnosis: Other abnormalities of gait and mobility (R26.89);History of falling (Z91.81)    Time: 1308-6578 PT Time Calculation (min) (ACUTE ONLY): 21 min   Charges:   PT Evaluation $PT Eval Moderate Complexity: 1 Mod PT Treatments $Therapeutic  Exercise: 8-22 mins       Olga Coaster PT, DPT 3:02 PM,03/28/20

## 2020-06-03 ENCOUNTER — Ambulatory Visit (INDEPENDENT_AMBULATORY_CARE_PROVIDER_SITE_OTHER): Payer: Medicare Other | Admitting: Physician Assistant

## 2020-06-03 ENCOUNTER — Telehealth: Payer: Self-pay

## 2020-06-03 ENCOUNTER — Other Ambulatory Visit: Payer: Self-pay

## 2020-06-03 DIAGNOSIS — N390 Urinary tract infection, site not specified: Secondary | ICD-10-CM

## 2020-06-03 DIAGNOSIS — Q386 Other congenital malformations of mouth: Secondary | ICD-10-CM

## 2020-06-03 LAB — URINALYSIS, COMPLETE
Bilirubin, UA: NEGATIVE
Glucose, UA: NEGATIVE
Ketones, UA: NEGATIVE
Nitrite, UA: POSITIVE — AB
Specific Gravity, UA: 1.025 (ref 1.005–1.030)
Urobilinogen, Ur: 0.2 mg/dL (ref 0.2–1.0)
pH, UA: 5.5 (ref 5.0–7.5)

## 2020-06-03 LAB — MICROSCOPIC EXAMINATION
RBC, Urine: NONE SEEN /hpf (ref 0–2)
WBC, UA: >30 /hpf — ABNORMAL HIGH (ref 0–5)

## 2020-06-03 MED ORDER — CIPROFLOXACIN HCL 250 MG PO TABS: 250.0000 mg | ORAL_TABLET | Freq: Two times a day (BID) | ORAL | 0 refills | Status: AC

## 2020-06-03 NOTE — Progress Notes (Signed)
In and Out Catheterization  Patient is present today for a I & O catheterization due to dysuria. Patient was cleaned and prepped in a sterile fashion with betadine . A 14FR cath was inserted no complications were noted , 47ml of urine return was noted, urine was orange in color. A clean urine sample was collected for UA, UCX. Bladder was drained  And catheter was removed with out difficulty.    Preformed by: Teressa Lower, CMA

## 2020-06-03 NOTE — Telephone Encounter (Signed)
Patient called today stating that she started having uti symptoms over the weekend and would like a script for abx sent into her pharmacy. It was explained that we would need for her to come into the office and leave a urine sample and be evaluated by a PA to rule out infection. Patient states she is not able to drive and may not be able to find a ride. Detail explanation was given that if we send in abx to treat with out a urine it may be the wrong medication and delay her treatment. Patient was added on to PA schedule today for further evaluation she will try to get a ride if unable she will call back.

## 2020-06-03 NOTE — Progress Notes (Signed)
06/03/2020 4:53 PM   Jessica Donovan 08-30-1946 [id number]  CC: Chief Complaint  Patient presents with  . Recurrent UTI    HPI: Jessica Donovan is a 74 y.o. 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 She is an established [facility] patient who last saw Dr. Apolinar Junes on 06/06/2019 for routine follow-up of the above.  Today, patient reports an approximate 4-day history of dysuria, frequency, and lower abdominal pressure. She denies fever, chills, nausea, vomiting, and gross hematuria.  Per chart review, patient has had two positive urine cultures since October 2020, both with nitrofurantoin resistant E. coli.  Additionally, patient reports occasional development of small white bumps on her labia majora.  She states that she typically tries to express these at home but only causes bleeding.  She wonders if we would I&D these as needed. Lastly, she wonders if she should restart a daily suppressive antibiotic for her recurrent UTIs.  She reports continuing to use topical vaginal estrogen cream and cranberry supplementation for UTI prevention and states she is doing well on these.  In-office catheterized UA today positive for 2+ blood, 2+ protein, nitrites, and 2+ leukocyte esterase; urine microscopy with >30 WBCs/HPF and many bacteria. PVR 46mL.  PMH: Past Medical History:  Diagnosis Date  . Anxiety   . Chronic back pain   . Collagen vascular disease (HCC)   . Coronary artery disease    a. s/p PCI/DES to dRCA & mLCx in 2014 with repeat LHC in 10/2015 showing patent stents  . Diabetes mellitus without complication (HCC)   . Hypertension   . Low back pain   . Myocardial infarction (HCC)   . Osteomyelitis of toe (HCC) 01/23/2016  . Stroke [facility])     Surgical History: Past Surgical History:  Procedure Laterality Date  .  AMPUTATION TOE Left 11/10/2015   Procedure: AMPUTATION TOE;  Surgeon: Linus Galas, MD;  Location: [facility] ORS;  Service: Podiatry;  Laterality: Left;  . AMPUTATION TOE Left 01/24/2016   Procedure: AMPUTATION TOE (2nd mpj);  Surgeon: Linus Galas, DPM;  Location: [facility] ORS;  Service: Podiatry;  Laterality: Left;  . AMPUTATION TOE Right 12/21/2018   Procedure: 2ND TOE AMPUTATION WITH DEBRIDEMENT OF SOFT TISSUE;  Surgeon: Recardo Evangelist, DPM;  Location: [facility] ORS;  Service: Podiatry;  Laterality: Right;  . CARDIAC CATHETERIZATION N/A 11/12/2015   Procedure: Left Heart Cath and Coronary Angiography;  Surgeon: Marcina Millard, MD;  Location: [facility] INVASIVE CV LAB;  Service: Cardiovascular;  Laterality: N/A;  . COLONOSCOPY WITH PROPOFOL N/A 07/20/2017   Procedure: COLONOSCOPY WITH PROPOFOL;  Surgeon: Wyline Mood, MD;  Location: [facility] ENDOSCOPY;  Service: Endoscopy;  Laterality: N/A;  . JOINT REPLACEMENT    . TOE AMPUTATION      Home Medications:  Allergies as of 06/03/2020      Reactions   Amoxicillin Diarrhea   Ampicillin Nausea And Vomiting, Other (See Comments)   Did it involve swelling of the face/tongue/throat, SOB, or low BP? Yes Did it involve sudden or severe rash/hives, skin peeling, or any reaction on the inside of your mouth or nose? No Did you need to seek medical attention at a hospital or doctor's office? No When did it last happen?>10 years If all above answers are "NO", may proceed with cephalosporin use.   Augmentin [amoxicillin-pot Clavulanate] Diarrhea   Bactrim [sulfamethoxazole-trimethoprim] Other (See Comments)   N/V/D  Codeine Nausea And Vomiting   Flu Virus Vaccine Other (See Comments)   Reaction: Passed out for 12 days   Influenza Vaccines Other (See Comments)   Reaction:  Caused pt to pass out    Methadone Hives, Itching   Oxycodone-acetaminophen Nausea And Vomiting   Percocet [oxycodone-acetaminophen] Nausea And Vomiting   Sulfa Antibiotics Other (See Comments)    Reaction: unknown   Tetanus Antitoxin Swelling, Other (See Comments)   Reaction: injection site swelling   Tetanus Toxoids Swelling, Other (See Comments)   Reaction:  Swelling at injection site   Penicillin G Rash   Has patient had a PCN reaction causing immediate rash, facial/tongue/throat swelling, SOB or lightheadedness with hypotension: Yes Has patient had a PCN reaction causing severe rash involving mucus membranes or skin necrosis: No Has patient had a PCN reaction that required hospitalization: No Has patient had a PCN reaction occurring within the last 10 years: No If all of the above answers are "NO", then may proceed with Cephalosporin use..   Tetracyclines & Related Rash      Medication List       Accurate as of June 03, 2020  4:53 PM. If you have any questions, ask your nurse or doctor.        acetaminophen 500 MG tablet Commonly known as: TYLENOL Take 1,000 mg by mouth every 8 (eight) hours as needed for mild pain or headache.   ALPRAZolam 1 MG tablet Commonly known as: XANAX Take 1 tablet (1 mg total) by mouth 2 (two) times daily as needed for anxiety. What changed: when to take this   AMBULATORY NON FORMULARY MEDICATION Compounded estrogen  Apply 1 gram vaginally M,W, Friday [facility]   aspirin 81 MG tablet Take 81 mg by mouth daily.   atorvastatin 20 MG tablet Commonly known as: LIPITOR Take 20 mg by mouth daily.   AZO CRANBERRY GUMMIES PO Take by mouth. 2 gummies daily   Biotin 1 MG Caps Take 1 mg by mouth as directed.   ciprofloxacin 250 MG tablet Commonly known as: CIPRO Take 1 tablet (250 mg total) by mouth 2 (two) times daily for 5 days. Started by: Debroah Loop, PA-C   cyclobenzaprine 10 MG tablet Commonly known as: FLEXERIL Take 10 mg by mouth 3 (three) times daily as needed.   diphenoxylate-atropine 2.5-0.025 MG tablet Commonly known as: LOMOTIL Take 1 tablet by mouth every 4 (four) hours as needed for diarrhea or  loose stools.   ferrous sulfate 325 (65 FE) MG tablet Take 325 mg by mouth daily with breakfast.   HAIR SKIN AND NAILS FORMULA PO Take by mouth daily. 2 gummies daily   HumaLOG KwikPen 100 UNIT/ML KwikPen Generic drug: insulin lispro Inject 10 Units into the skin 3 (three) times daily. Pt just on sliding scale with meals   insulin aspart 100 UNIT/ML injection Commonly known as: novoLOG Inject 3-15 Units into the skin 3 (three) times daily with meals as needed for high blood sugar. Pt uses as needed per sliding scale:    Less than 140:  0 units  140-180:  3 units 181-220:  4 units 221- 260:  6 units 261- 320:  8 units 321-360:  10 units 361-400:  12 units Greater than 400:  15 units   metoprolol tartrate 25 MG tablet Commonly known as: LOPRESSOR Take 0.5 tablets (12.5 mg total) by mouth 2 (two) times daily. What changed: how much to take   mirtazapine 15 MG tablet Commonly known as: REMERON  Take 15 mg by mouth at bedtime.   morphine 15 MG tablet Commonly known as: MSIR Take 1 tablet (15 mg total) by mouth 2 (two) times daily. What changed:   when to take this  reasons to take this   omeprazole 20 MG capsule Commonly known as: PRILOSEC Take 20 mg by mouth daily.   pregabalin 150 MG capsule Commonly known as: LYRICA Take 150 mg by mouth in the morning, at noon, and at bedtime.   promethazine 25 MG tablet Commonly known as: PHENERGAN Take 25 mg by mouth every 4 (four) hours as needed for nausea or vomiting.   Toujeo SoloStar 300 UNIT/ML Solostar Pen Generic drug: insulin glargine (1 Unit Dial) Inject 50 Units into the skin daily.   Toviaz 8 MG Tb24 tablet Generic drug: fesoterodine Take 8 mg by mouth daily.   traZODone 100 MG tablet Commonly known as: DESYREL Take 100 mg by mouth at bedtime.   venlafaxine XR 75 MG 24 hr capsule Commonly known as: EFFEXOR-XR Take 75 mg by mouth 3 (three) times daily.   VITAMIN B-12 PO Take by mouth daily. 2 gummies  daily   Vitamin D (Ergocalciferol) 1.25 MG (50000 UNIT) Caps capsule Commonly known as: DRISDOL Take 50,000 Units by mouth every Sunday.       Allergies:  Allergies  Allergen Reactions  . Amoxicillin Diarrhea  . Ampicillin Nausea And Vomiting and Other (See Comments)    Did it involve swelling of the face/tongue/throat, SOB, or low BP? Yes Did it involve sudden or severe rash/hives, skin peeling, or any reaction on the inside of your mouth or nose? No Did you need to seek medical attention at a hospital or doctor's office? No When did it last happen?>10 years If all above answers are "NO", may proceed with cephalosporin use.   . Augmentin [Amoxicillin-Pot Clavulanate] Diarrhea  . Bactrim [Sulfamethoxazole-Trimethoprim] Other (See Comments)    N/V/D  . Codeine Nausea And Vomiting  . Flu Virus Vaccine Other (See Comments)    Reaction: Passed out for 12 days  . Influenza Vaccines Other (See Comments)    Reaction:  Caused pt to pass out   . Methadone Hives and Itching  . Oxycodone-Acetaminophen Nausea And Vomiting  . Percocet [Oxycodone-Acetaminophen] Nausea And Vomiting  . Sulfa Antibiotics Other (See Comments)    Reaction: unknown  . Tetanus Antitoxin Swelling and Other (See Comments)    Reaction: injection site swelling  . Tetanus Toxoids Swelling and Other (See Comments)    Reaction:  Swelling at injection site  . Penicillin G Rash    Has patient had a PCN reaction causing immediate rash, facial/tongue/throat swelling, SOB or lightheadedness with hypotension: Yes Has patient had a PCN reaction causing severe rash involving mucus membranes or skin necrosis: No Has patient had a PCN reaction that required hospitalization: No Has patient had a PCN reaction occurring within the last 10 years: No If all of the above answers are "NO", then may proceed with Cephalosporin use..  . Tetracyclines & Related Rash    Family History: Family History  Problem Relation Age of Onset    . CAD Mother   . CAD Father     Social History:   reports that she has never smoked. She has never used smokeless tobacco. She reports that she does not drink alcohol and does not use drugs.  Physical Exam: There were no vitals taken for this visit.  Constitutional:  Alert and oriented, no acute distress, nontoxic appearing HEENT: [place],  AT Cardiovascular: No clubbing, cyanosis, or edema Respiratory: Normal respiratory effort, no increased work of breathing GU: Punctate Fordyce spot of the medial right labia majora Skin: No rashes, bruises or suspicious lesions Neurologic: Grossly intact, no focal deficits, moving all 4 extremities Psychiatric: Normal mood and affect  Laboratory Data: Results for orders placed or performed in visit on 06/03/20  Microscopic Examination   Urine  Result Value Ref Range   WBC, UA >30 (H) 0 - 5 /hpf   RBC None seen 0 - 2 /hpf   Epithelial Cells (non renal) 0-10 0 - 10 /hpf   Bacteria, UA Many (A) None seen/Few  Urinalysis, Complete  Result Value Ref Range   Specific Gravity, UA 1.025 1.005 - 1.030   pH, UA 5.5 5.0 - 7.5   Color, UA Yellow Yellow   Appearance Ur Cloudy (A) Clear   Leukocytes,UA 2+ (A) Negative   Protein,UA 2+ (A) Negative/Trace   Glucose, UA Negative Negative   Ketones, UA Negative Negative   RBC, UA 2+ (A) Negative   Bilirubin, UA Negative Negative   Urobilinogen, Ur 0.2 0.2 - 1.0 mg/dL   Nitrite, UA Positive (A) Negative   Microscopic Examination See below:    Assessment & Plan:   1. Recurrent UTI UA grossly infected today, will send for culture and start patient on empiric Cipro.  Counseled patient that I would contact her if her urine culture indicated a necessary change in therapy.  She expressed understanding.  We will plan for 56-month follow-up for for recurrent UTI.  Do not recommend reinitiation of suppressive antibiotics given infrequency of infection on topical vaginal estrogen cream and cranberry supplements, as  well as development of Macrobid resistant organisms and extensive medication allergies. - Urinalysis, Complete - ciprofloxacin (CIPRO) 250 MG tablet; Take 1 tablet (250 mg total) by mouth 2 (two) times daily for 5 days.  Dispense: 10 tablet; Refill: 0 - CULTURE, URINE COMPREHENSIVE - Microscopic Examination   2. Fordyce spots Reassured patient that this is a benign finding.  No further intervention indicated.  Return in about 6 months (around 12/03/2020) for rUTI follow-up.  Carman Ching, PA-C  [facility] [address] [place], [place] [address] [phone]

## 2020-06-07 LAB — CULTURE, URINE COMPREHENSIVE

## 2020-06-12 ENCOUNTER — Ambulatory Visit: Payer: Medicare HMO | Admitting: Urology

## 2020-11-14 ENCOUNTER — Inpatient Hospital Stay
Admission: EM | Admit: 2020-11-14 | Discharge: 2020-11-17 | DRG: 093 | Disposition: A | Discharge status: Home Health | Payer: Medicare Other | Attending: Internal Medicine | Admitting: Internal Medicine

## 2020-11-14 ENCOUNTER — Emergency Department: Payer: Medicare Other

## 2020-11-14 ENCOUNTER — Other Ambulatory Visit: Payer: Self-pay

## 2020-11-14 DIAGNOSIS — T481X5A Adverse effect of skeletal muscle relaxants [neuromuscular blocking agents], initial encounter: Secondary | ICD-10-CM | POA: Diagnosis present

## 2020-11-14 DIAGNOSIS — W19XXXA Unspecified fall, initial encounter: Secondary | ICD-10-CM | POA: Diagnosis not present

## 2020-11-14 DIAGNOSIS — E1142 Type 2 diabetes mellitus with diabetic polyneuropathy: Secondary | ICD-10-CM | POA: Diagnosis present

## 2020-11-14 DIAGNOSIS — Z8249 Family history of ischemic heart disease and other diseases of the circulatory system: Secondary | ICD-10-CM

## 2020-11-14 DIAGNOSIS — K219 Gastro-esophageal reflux disease without esophagitis: Secondary | ICD-10-CM | POA: Diagnosis present

## 2020-11-14 DIAGNOSIS — T43215A Adverse effect of selective serotonin and norepinephrine reuptake inhibitors, initial encounter: Secondary | ICD-10-CM | POA: Diagnosis present

## 2020-11-14 DIAGNOSIS — Z66 Do not resuscitate: Secondary | ICD-10-CM | POA: Diagnosis present

## 2020-11-14 DIAGNOSIS — Z79890 Hormone replacement therapy (postmenopausal): Secondary | ICD-10-CM

## 2020-11-14 DIAGNOSIS — G928 Other toxic encephalopathy: Secondary | ICD-10-CM | POA: Diagnosis not present

## 2020-11-14 DIAGNOSIS — Z9181 History of falling: Secondary | ICD-10-CM

## 2020-11-14 DIAGNOSIS — Z794 Long term (current) use of insulin: Secondary | ICD-10-CM

## 2020-11-14 DIAGNOSIS — E1165 Type 2 diabetes mellitus with hyperglycemia: Secondary | ICD-10-CM | POA: Diagnosis present

## 2020-11-14 DIAGNOSIS — R410 Disorientation, unspecified: Secondary | ICD-10-CM | POA: Diagnosis not present

## 2020-11-14 DIAGNOSIS — I252 Old myocardial infarction: Secondary | ICD-10-CM

## 2020-11-14 DIAGNOSIS — Z8673 Personal history of transient ischemic attack (TIA), and cerebral infarction without residual deficits: Secondary | ICD-10-CM

## 2020-11-14 DIAGNOSIS — Z882 Allergy status to sulfonamides status: Secondary | ICD-10-CM

## 2020-11-14 DIAGNOSIS — T50901A Poisoning by unspecified drugs, medicaments and biological substances, accidental (unintentional), initial encounter: Secondary | ICD-10-CM

## 2020-11-14 DIAGNOSIS — I10 Essential (primary) hypertension: Secondary | ICD-10-CM | POA: Diagnosis not present

## 2020-11-14 DIAGNOSIS — Z89422 Acquired absence of other left toe(s): Secondary | ICD-10-CM

## 2020-11-14 DIAGNOSIS — Z955 Presence of coronary angioplasty implant and graft: Secondary | ICD-10-CM

## 2020-11-14 DIAGNOSIS — Z79899 Other long term (current) drug therapy: Secondary | ICD-10-CM | POA: Diagnosis not present

## 2020-11-14 DIAGNOSIS — F419 Anxiety disorder, unspecified: Secondary | ICD-10-CM | POA: Diagnosis present

## 2020-11-14 DIAGNOSIS — Z887 Allergy status to serum and vaccine status: Secondary | ICD-10-CM

## 2020-11-14 DIAGNOSIS — Z880 Allergy status to penicillin: Secondary | ICD-10-CM

## 2020-11-14 DIAGNOSIS — G8929 Other chronic pain: Secondary | ICD-10-CM | POA: Diagnosis present

## 2020-11-14 DIAGNOSIS — Z20822 Contact with and (suspected) exposure to covid-19: Secondary | ICD-10-CM | POA: Diagnosis present

## 2020-11-14 DIAGNOSIS — T424X5A Adverse effect of benzodiazepines, initial encounter: Secondary | ICD-10-CM | POA: Diagnosis present

## 2020-11-14 DIAGNOSIS — Z885 Allergy status to narcotic agent status: Secondary | ICD-10-CM

## 2020-11-14 DIAGNOSIS — M545 Low back pain, unspecified: Secondary | ICD-10-CM | POA: Diagnosis present

## 2020-11-14 DIAGNOSIS — E785 Hyperlipidemia, unspecified: Secondary | ICD-10-CM | POA: Diagnosis present

## 2020-11-14 DIAGNOSIS — Z7982 Long term (current) use of aspirin: Secondary | ICD-10-CM

## 2020-11-14 DIAGNOSIS — I2510 Atherosclerotic heart disease of native coronary artery without angina pectoris: Secondary | ICD-10-CM | POA: Diagnosis present

## 2020-11-14 LAB — CBC WITH DIFFERENTIAL/PLATELET
Abs Immature Granulocytes: 0.03 10*3/uL (ref 0.00–0.07)
Basophils Absolute: 0.1 10*3/uL (ref 0.0–0.1)
Basophils Relative: 1 %
Eosinophils Absolute: 0.2 10*3/uL (ref 0.0–0.5)
Eosinophils Relative: 2 %
HCT: 41.8 % (ref 36.0–46.0)
Hemoglobin: 13.5 g/dL (ref 12.0–15.0)
Immature Granulocytes: 0 %
Lymphocytes Relative: 26 %
Lymphs Abs: 2.2 10*3/uL (ref 0.7–4.0)
MCH: 32.1 pg (ref 26.0–34.0)
MCHC: 32.3 g/dL (ref 30.0–36.0)
MCV: 99.5 fL (ref 80.0–100.0)
Monocytes Absolute: 0.7 10*3/uL (ref 0.1–1.0)
Monocytes Relative: 8 %
Neutro Abs: 5.4 10*3/uL (ref 1.7–7.7)
Neutrophils Relative %: 63 %
Platelets: 244 10*3/uL (ref 150–400)
RBC: 4.2 MIL/uL (ref 3.87–5.11)
RDW: 12.6 % (ref 11.5–15.5)
WBC: 8.5 10*3/uL (ref 4.0–10.5)
nRBC: 0 % (ref 0.0–0.2)

## 2020-11-14 LAB — COMPREHENSIVE METABOLIC PANEL
ALT: 14 U/L (ref 0–44)
AST: 38 U/L (ref 15–41)
Albumin: 4.3 g/dL (ref 3.5–5.0)
Alkaline Phosphatase: 80 U/L (ref 38–126)
Anion gap: 10 (ref 5–15)
BUN: 32 mg/dL — ABNORMAL HIGH (ref 8–23)
CO2: 27 mmol/L (ref 22–32)
Calcium: 9.5 mg/dL (ref 8.9–10.3)
Chloride: 100 mmol/L (ref 98–111)
Creatinine, Ser: 1.01 mg/dL — ABNORMAL HIGH (ref 0.44–1.00)
GFR, Estimated: 58 mL/min — ABNORMAL LOW (ref >60)
Glucose, Bld: 215 mg/dL — ABNORMAL HIGH (ref 70–99)
Potassium: 4.6 mmol/L (ref 3.5–5.1)
Sodium: 137 mmol/L (ref 135–145)
Total Bilirubin: 0.7 mg/dL (ref 0.3–1.2)
Total Protein: 7.1 g/dL (ref 6.5–8.1)

## 2020-11-14 LAB — RESP PANEL BY RT-PCR (FLU A&B, COVID) ARPGX2
Influenza A by PCR: NEGATIVE
Influenza B by PCR: NEGATIVE
SARS Coronavirus 2 by RT PCR: NEGATIVE

## 2020-11-14 LAB — URINE DRUG SCREEN, QUALITATIVE (ARMC ONLY)
Amphetamines, Ur Screen: NOT DETECTED
Barbiturates, Ur Screen: NOT DETECTED
Benzodiazepine, Ur Scrn: POSITIVE — AB
Cannabinoid 50 Ng, Ur ~~LOC~~: NOT DETECTED
Cocaine Metabolite,Ur ~~LOC~~: NOT DETECTED
MDMA (Ecstasy)Ur Screen: NOT DETECTED
Methadone Scn, Ur: NOT DETECTED
Opiate, Ur Screen: POSITIVE — AB
Phencyclidine (PCP) Ur S: NOT DETECTED
Tricyclic, Ur Screen: POSITIVE — AB

## 2020-11-14 LAB — URINALYSIS, COMPLETE (UACMP) WITH MICROSCOPIC
Bacteria, UA: NONE SEEN
Bilirubin Urine: NEGATIVE
Glucose, UA: >=500 mg/dL — AB
Hgb urine dipstick: NEGATIVE
Ketones, ur: NEGATIVE mg/dL
Leukocytes,Ua: NEGATIVE
Nitrite: NEGATIVE
Protein, ur: NEGATIVE mg/dL
Specific Gravity, Urine: 1.016 (ref 1.005–1.030)
pH: 5 (ref 5.0–8.0)

## 2020-11-14 LAB — SALICYLATE LEVEL: Salicylate Lvl: <7 mg/dL — ABNORMAL LOW (ref 7.0–30.0)

## 2020-11-14 LAB — TROPONIN I (HIGH SENSITIVITY): Troponin I (High Sensitivity): 10 ng/L (ref <18)

## 2020-11-14 LAB — ETHANOL: Alcohol, Ethyl (B): <10 mg/dL (ref <10)

## 2020-11-14 LAB — ACETAMINOPHEN LEVEL: Acetaminophen (Tylenol), Serum: <10 ug/mL — ABNORMAL LOW (ref 10–30)

## 2020-11-14 MED ORDER — METOPROLOL TARTRATE 25 MG PO TABS
25.0000 mg | ORAL_TABLET | Freq: Two times a day (BID) | ORAL | Status: DC
  Administered 2020-11-15 – 2020-11-17 (×5): 25 mg via ORAL
  Filled 2020-11-14 (×5): qty 1

## 2020-11-14 MED ORDER — PANTOPRAZOLE SODIUM 40 MG PO TBEC
40.0000 mg | DELAYED_RELEASE_TABLET | Freq: Every day | ORAL | Status: DC
  Administered 2020-11-15 – 2020-11-17 (×3): 40 mg via ORAL
  Filled 2020-11-14 (×3): qty 1

## 2020-11-14 MED ORDER — ACETAMINOPHEN 325 MG PO TABS
650.0000 mg | ORAL_TABLET | Freq: Four times a day (QID) | ORAL | Status: DC | PRN
  Administered 2020-11-15 – 2020-11-17 (×5): 650 mg via ORAL
  Filled 2020-11-14 (×5): qty 2

## 2020-11-14 MED ORDER — MORPHINE SULFATE 15 MG PO TABS
15.0000 mg | ORAL_TABLET | ORAL | Status: DC | PRN
  Administered 2020-11-15 – 2020-11-17 (×7): 15 mg via ORAL
  Filled 2020-11-14 (×7): qty 1

## 2020-11-14 MED ORDER — ACETAMINOPHEN 650 MG RE SUPP: 650.0000 mg | Freq: Four times a day (QID) | RECTAL | Status: DC | PRN

## 2020-11-14 MED ORDER — INSULIN GLARGINE (1 UNIT DIAL) 300 UNIT/ML ~~LOC~~ SOPN: 50.0000 [IU] | PEN_INJECTOR | Freq: Every day | SUBCUTANEOUS | Status: DC

## 2020-11-14 MED ORDER — ASPIRIN EC 81 MG PO TBEC
81.0000 mg | DELAYED_RELEASE_TABLET | Freq: Every day | ORAL | Status: DC
  Administered 2020-11-15 – 2020-11-17 (×3): 81 mg via ORAL
  Filled 2020-11-14 (×3): qty 1

## 2020-11-14 MED ORDER — SODIUM CHLORIDE 0.9 % IV SOLN: INTRAVENOUS | Status: DC

## 2020-11-14 MED ORDER — NALOXONE HCL 2 MG/2ML IJ SOSY: 1.0000 mg | PREFILLED_SYRINGE | Freq: Once | INTRAMUSCULAR | Status: AC

## 2020-11-14 MED ORDER — VENLAFAXINE HCL ER 75 MG PO CP24
75.0000 mg | ORAL_CAPSULE | Freq: Three times a day (TID) | ORAL | Status: DC
  Administered 2020-11-15 – 2020-11-17 (×7): 75 mg via ORAL
  Filled 2020-11-14 (×10): qty 1

## 2020-11-14 MED ORDER — VITAMIN B-12 100 MCG PO TABS
100.0000 ug | ORAL_TABLET | Freq: Every day | ORAL | Status: DC
  Administered 2020-11-15 – 2020-11-17 (×3): 100 ug via ORAL
  Filled 2020-11-14 (×3): qty 1

## 2020-11-14 MED ORDER — ENOXAPARIN SODIUM 40 MG/0.4ML ~~LOC~~ SOLN
40.0000 mg | Freq: Every day | SUBCUTANEOUS | Status: DC
  Administered 2020-11-14: 40 mg via SUBCUTANEOUS
  Filled 2020-11-14 (×2): qty 0.4

## 2020-11-14 MED ORDER — ATORVASTATIN CALCIUM 20 MG PO TABS
20.0000 mg | ORAL_TABLET | Freq: Every day | ORAL | Status: DC
  Administered 2020-11-15 – 2020-11-17 (×3): 20 mg via ORAL
  Filled 2020-11-14 (×3): qty 1

## 2020-11-14 MED ORDER — MIRTAZAPINE 15 MG PO TABS
15.0000 mg | ORAL_TABLET | Freq: Every day | ORAL | Status: DC
  Administered 2020-11-15 – 2020-11-16 (×2): 15 mg via ORAL
  Filled 2020-11-14 (×2): qty 1

## 2020-11-14 MED ORDER — FERROUS SULFATE 325 (65 FE) MG PO TABS
325.0000 mg | ORAL_TABLET | Freq: Every day | ORAL | Status: DC
  Administered 2020-11-15 – 2020-11-17 (×3): 325 mg via ORAL
  Filled 2020-11-14 (×3): qty 1

## 2020-11-14 MED ORDER — MAGNESIUM HYDROXIDE 400 MG/5ML PO SUSP: 30.0000 mL | Freq: Every day | ORAL | Status: DC | PRN

## 2020-11-14 MED ORDER — DIPHENOXYLATE-ATROPINE 2.5-0.025 MG PO TABS: 1.0000 | ORAL_TABLET | ORAL | Status: DC | PRN

## 2020-11-14 MED ORDER — FESOTERODINE FUMARATE ER 8 MG PO TB24: 8.0000 mg | ORAL_TABLET | Freq: Every day | ORAL | Status: DC

## 2020-11-14 MED ORDER — ONDANSETRON HCL 4 MG/2ML IJ SOLN
4.0000 mg | Freq: Four times a day (QID) | INTRAMUSCULAR | Status: DC | PRN
  Administered 2020-11-16: 4 mg via INTRAVENOUS
  Filled 2020-11-14: qty 2

## 2020-11-14 MED ORDER — NALOXONE HCL 2 MG/2ML IJ SOSY
PREFILLED_SYRINGE | INTRAMUSCULAR | Status: AC
  Administered 2020-11-14: 1 mg via INTRAVENOUS
  Filled 2020-11-14: qty 2

## 2020-11-14 MED ORDER — ONDANSETRON HCL 4 MG PO TABS
4.0000 mg | ORAL_TABLET | Freq: Four times a day (QID) | ORAL | Status: DC | PRN
  Administered 2020-11-15: 4 mg via ORAL
  Filled 2020-11-14: qty 1

## 2020-11-14 NOTE — ED Notes (Signed)
Still need temp for triage vitals. Thermometer in room not working. Will attempt to replace batteries or let next shift RN know in case temp not obtained by 1900.

## 2020-11-14 NOTE — ED Notes (Signed)
Pt missing middle 2 toes on L foot and large toe and next toe on R foot.

## 2020-11-14 NOTE — ED Notes (Signed)
Pt given Narcan, pt now with eyes open and is responding to questions. SPO2 now 100% on RA.

## 2020-11-14 NOTE — ED Triage Notes (Signed)
Pt to ED via [facility] who state that neighbor called EMS because pt had fallen. Pt was found to be somnolent, arousable to voice but quickly falling back asleep. No injuries found by EMS. CBG was 239. EMS states that pt seems "overmedicated" but they could not find any meds in house. Missing toes on R foot (large toe and next toe) are from previous dog attack. EDP at bedside.

## 2020-11-14 NOTE — ED Notes (Addendum)
Pharmacy contacted requesting verification and to tube insulin for administration.

## 2020-11-14 NOTE — ED Notes (Signed)
MD notified that patient remains lethargic and difficult to arouse. Patient only awakens with moderate sternal rub and repetitive loud verbal stimulation. Once awake pt unable to keep eyes open for more than a second and is delayed with responses to question with noted slurred speech. Patient admits to RN after repetitive questioning/stimulation that she ingested more of her prescriptions than she was supposed to but was unable to report which medications she ingested. Patient also states that the year is 2023 and today is Christmas when asked orientation questions. Patient was able to identify that she was in a hospital in [place], However.

## 2020-11-14 NOTE — ED Notes (Signed)
Pt now somnolent again. Arousable to voice.

## 2020-11-14 NOTE — ED Notes (Signed)
X-ray at bedside

## 2020-11-14 NOTE — ED Provider Notes (Signed)
[facility] Emergency Department Provider Note ____________________________________________   First MD Initiated Contact with Patient 11/14/20 1810     (approximate)  I have reviewed the triage vital signs and the nursing notes.   HISTORY  Chief Complaint Altered Mental Status  Level 5 caveat: History of present illness limited due to altered mental status  HPI Jessica Donovan is a 74 y.o. female with PMH as noted below who presents with falls and altered mental status. Per EMS, neighbors called and out several times today due to falls. The patient is somnolent and is unable to give any history. She denies pain, however is not able to answer most questions.  Past Medical History:  Diagnosis Date  . Anxiety   . Chronic back pain   . Collagen vascular disease (HCC)   . Coronary artery disease    a. s/p PCI/DES to dRCA & mLCx in 2014 with repeat LHC in 10/2015 showing patent stents  . Diabetes mellitus without complication (HCC)   . Hypertension   . Low back pain   . Myocardial infarction (HCC)   . Osteomyelitis of toe (HCC) 01/23/2016  . Stroke [facility])     Patient Active Problem List   Diagnosis Date Noted  . Unresponsiveness 03/27/2020  . Acute metabolic encephalopathy 03/27/2020  . Unresponsive 03/27/2020  . Acute hypercapnic respiratory failure (HCC) 03/27/2020  . Change in mental status 02/08/2020  . Pressure injury of skin 10/23/2019  . Fall   . HTN (hypertension) 10/20/2019  . Right foot infection 12/19/2018  . Acute encephalopathy 07/06/2018  . Anxiety 06/10/2017  . Chest pain with moderate risk for cardiac etiology 06/09/2017  . UTI (urinary tract infection) 05/19/2017  . CAD (coronary artery disease) 05/19/2017  . Chronic narcotic use 05/19/2017  . Cellulitis in diabetic foot (HCC) 05/19/2017  . Opioid overdose (HCC) 02/23/2017  . Altered mental status 02/23/2017  . Acute colitis 01/19/2017  . Sepsis (HCC) 01/17/2017  . Cellulitis of  fourth toe of right foot 04/10/2016  . Somnolence 04/09/2016  . Dementia (HCC) 04/09/2016  . Cellulitis 04/03/2016  . Hematoma of right parietal scalp 02/23/2016  . Multiple falls 02/23/2016  . Physical debility 02/23/2016  . Type II diabetes mellitus, uncontrolled (HCC) 02/23/2016  . Syncope and collapse 02/23/2016  . Polypharmacy 02/23/2016  . Osteomyelitis of toe (HCC) 01/23/2016  . Rhabdomyolysis 12/07/2015  . ARF (acute renal failure) (HCC) 11/29/2015  . Hypotension 11/29/2015  . 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 Angina effort 11/07/2015  . Osteomyelitis (HCC) 11/07/2015  . Anemia due to chronic blood loss 06/14/2015  . Generalized anxiety disorder 06/14/2015  . Lumbago with sciatica 06/14/2015  . 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  . 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 Abnormal weight loss 05/18/2014  . Chronic pain syndrome 05/18/2014  . 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 Diabetic polyneuropathy (HCC) 05/18/2014  . Type 2 diabetes mellitus with diabetic neuropathy (HCC) 05/07/2014  . Hemorrhage of rectum and anus 01/17/2014  . Cellulitis and abscess of foot excluding toe 05/18/2012    Past Surgical History:  Procedure Laterality Date  . AMPUTATION TOE Left 11/10/2015   Procedure: AMPUTATION TOE;  Surgeon: Linus Galas, MD;  Location: [facility] ORS;  Service: Podiatry;  Laterality: Left;  . AMPUTATION TOE Left 01/24/2016   Procedure: AMPUTATION TOE (2nd mpj);  Surgeon: Linus Galas, DPM;  Location: [facility] ORS;  Service:  Podiatry;  Laterality: Left;  . AMPUTATION TOE Right 12/21/2018   Procedure: 2ND TOE AMPUTATION WITH DEBRIDEMENT OF SOFT TISSUE;  Surgeon: Recardo Evangelist, DPM;  Location: [facility] ORS;  Service: Podiatry;  Laterality: Right;  . CARDIAC CATHETERIZATION N/A 11/12/2015   Procedure: Left Heart Cath and Coronary  Angiography;  Surgeon: Marcina Millard, MD;  Location: [facility] INVASIVE CV LAB;  Service: Cardiovascular;  Laterality: N/A;  . COLONOSCOPY WITH PROPOFOL N/A 07/20/2017   Procedure: COLONOSCOPY WITH PROPOFOL;  Surgeon: Wyline Mood, MD;  Location: [facility] ENDOSCOPY;  Service: Endoscopy;  Laterality: N/A;  . JOINT REPLACEMENT    . TOE AMPUTATION      Prior to Admission medications   Medication Sig Start Date End Date Taking? Authorizing Provider  acetaminophen (TYLENOL) 500 MG tablet Take 1,000 mg by mouth every 8 (eight) hours as needed for mild pain or headache.     [provider]  ALPRAZolam Prudy Feeler) 1 MG tablet Take 1 tablet (1 mg total) by mouth 2 (two) times daily as needed for anxiety. Patient taking differently: Take 1 mg by mouth at bedtime as needed for anxiety.  02/09/20   Lynn Ito, MD  AMBULATORY NON FORMULARY MEDICATION Compounded estrogen  Apply 1 gram vaginally M,W, Friday [facility] 01/12/20   Vanna Scotland, MD  aspirin 81 MG tablet Take 81 mg by mouth daily.    [provider]  atorvastatin (LIPITOR) 20 MG tablet Take 20 mg by mouth daily.     [provider]  AZO CRANBERRY GUMMIES PO Take by mouth. 2 gummies daily    [provider]  Biotin 1 MG CAPS Take 1 mg by mouth as directed.    [provider]  Cyanocobalamin (VITAMIN B-12 PO) Take by mouth daily. 2 gummies daily    [provider]  cyclobenzaprine (FLEXERIL) 10 MG tablet Take 10 mg by mouth 3 (three) times daily as needed.  02/12/20   [provider]  diphenoxylate-atropine (LOMOTIL) 2.5-0.025 MG tablet Take 1 tablet by mouth every 4 (four) hours as needed for diarrhea or loose stools.     [provider]  ferrous sulfate 325 (65 FE) MG tablet Take 325 mg by mouth daily with breakfast.    [provider]  fesoterodine (TOVIAZ) 8 MG TB24 tablet Take 8 mg by mouth daily.    [provider]  HUMALOG KWIKPEN 100 UNIT/ML  KwikPen Inject 10 Units into the skin 3 (three) times daily. Pt just on sliding scale with meals 01/19/20   [provider]  insulin aspart (NOVOLOG) 100 UNIT/ML injection Inject 3-15 Units into the skin 3 (three) times daily with meals as needed for high blood sugar. Pt uses as needed per sliding scale:    Less than 140:  0 units  140-180:  3 units 181-220:  4 units 221- 260:  6 units 261- 320:  8 units 321-360:  10 units 361-400:  12 units Greater than 400:  15 units    [provider]  metoprolol tartrate (LOPRESSOR) 25 MG tablet Take 0.5 tablets (12.5 mg total) by mouth 2 (two) times daily. Patient taking differently: Take 25 mg by mouth 2 (two) times daily.  02/09/20   Lynn Ito, MD  mirtazapine (REMERON) 15 MG tablet Take 15 mg by mouth at bedtime.    [provider]  morphine (MSIR) 15 MG tablet Take 1 tablet (15 mg total) by mouth 2 (two) times daily. Patient taking differently: Take 15 mg by mouth every 4 (four)  hours as needed.  02/09/20   Lynn Ito, MD  Multiple Vitamins-Minerals (HAIR SKIN AND NAILS FORMULA PO) Take by mouth daily. 2 gummies daily    [provider]  omeprazole (PRILOSEC) 20 MG capsule Take 20 mg by mouth daily.    [provider]  pregabalin (LYRICA) 150 MG capsule Take 150 mg by mouth in the morning, at noon, and at bedtime.    [provider]  promethazine (PHENERGAN) 25 MG tablet Take 25 mg by mouth every 4 (four) hours as needed for nausea or vomiting.    [provider]  TOUJEO SOLOSTAR 300 UNIT/ML SOPN Inject 50 Units into the skin daily. 08/08/19   [provider]  traZODone (DESYREL) 100 MG tablet Take 100 mg by mouth at bedtime.    [provider]  venlafaxine XR (EFFEXOR-XR) 75 MG 24 hr capsule Take 75 mg by mouth 3 (three) times daily.    [provider]  Vitamin D, Ergocalciferol, (DRISDOL) 50000 units CAPS capsule Take 50,000 Units by mouth every Sunday.      [provider]    Allergies Amoxicillin, Ampicillin, Augmentin [amoxicillin-pot clavulanate], Bactrim [sulfamethoxazole-trimethoprim], Codeine, Flu virus vaccine, Influenza vaccines, Methadone, Oxycodone-acetaminophen, Percocet [oxycodone-acetaminophen], Sulfa antibiotics, Tetanus antitoxin, Tetanus toxoids, Penicillin g, and Tetracyclines & related  Family History  Problem Relation Age of Onset  . CAD Mother   . CAD Father     Social History Social History   Tobacco Use  . Smoking status: Never Smoker  . Smokeless tobacco: Never Used  Vaping Use  . Vaping Use: Never used  Substance Use Topics  . Alcohol use: No  . Drug use: No    Review of Systems Level 5 caveat: Unable to obtain review of systems due to altered mental status    ____________________________________________   PHYSICAL EXAM:  VITAL SIGNS: ED Triage Vitals  Enc Vitals Group     BP 11/14/20 1805 (!) 131/54     Pulse Rate 11/14/20 1805 100     Resp 11/14/20 1805 18     Temp --      Temp src --      SpO2 11/14/20 1805 (!) 85 %     Weight 11/14/20 1808 200 lb (90.7 kg)     Height 11/14/20 1808 5\' 5"  (1.651 m)     Head Circumference --      Peak Flow --      Pain Score --      Pain Loc --      Pain Edu? --      Excl. in GC? --     Constitutional: Somnolent but somewhat arousable. Eyes: Conjunctivae are normal. EOMI. PERRLA. Head: Atraumatic. Nose: No congestion/rhinnorhea. Mouth/Throat: Mucous membranes are dry. Neck: Normal range of motion.  Cardiovascular: Normal rate, regular rhythm. Grossly normal heart sounds.  Good peripheral circulation. Respiratory: Normal respiratory effort.  No retractions. Lungs CTAB. Gastrointestinal: Soft and nontender. No distention.  Genitourinary: No flank tenderness. Musculoskeletal: Extremities warm and well perfused.  Neurologic: Slurred speech. Motor intact in all extremities. Skin:  Skin is warm and dry. No rash noted. Psychiatric: Unable to  assess due to altered mental status.  ____________________________________________   LABS (all labs ordered are listed, but only abnormal results are displayed)  Labs Reviewed  COMPREHENSIVE METABOLIC PANEL - Abnormal; Notable for the following components:      Result Value   Glucose, Bld 215 (*)    BUN 32 (*)    Creatinine, Ser 1.01 (*)  GFR, Estimated 58 (*)    All other components within normal limits  ACETAMINOPHEN LEVEL - Abnormal; Notable for the following components:   Acetaminophen (Tylenol), Serum <10 (*)    All other components within normal limits  SALICYLATE LEVEL - Abnormal; Notable for the following components:   Salicylate Lvl <7.0 (*)    All other components within normal limits  URINALYSIS, COMPLETE (UACMP) WITH MICROSCOPIC - Abnormal; Notable for the following components:   Color, Urine YELLOW (*)    APPearance CLEAR (*)    Glucose, UA >=500 (*)    All other components within normal limits  URINE DRUG SCREEN, QUALITATIVE ([facility] ONLY) - Abnormal; Notable for the following components:   Tricyclic, Ur Screen POSITIVE (*)    Opiate, Ur Screen POSITIVE (*)    Benzodiazepine, Ur Scrn POSITIVE (*)    All other components within normal limits  RESP PANEL BY RT-PCR (FLU A&B, COVID) ARPGX2  CBC WITH DIFFERENTIAL/PLATELET  ETHANOL  TROPONIN I (HIGH SENSITIVITY)  TROPONIN I (HIGH SENSITIVITY)   ____________________________________________  EKG  ED ECG REPORT I, Dionne BucySebastian Kahleb Mcclane, the attending physician, personally viewed and interpreted this ECG.  Date: 11/14/2020 EKG Time: 1811 Rate: 97 Rhythm: normal sinus rhythm QRS Axis: normal Intervals: normal ST/T Wave abnormalities: normal Narrative Interpretation: no evidence of acute ischemia  ____________________________________________  RADIOLOGY  CXR interpreted by me shows no focal infiltrate or edema although limited by poor inspiration CT head: No ICH or other acute intracranial  finding  ____________________________________________   PROCEDURES  Procedure(s) performed: No  Procedures  Critical Care performed: Yes  CRITICAL CARE Performed by: Dionne BucySebastian Vianney Kopecky   Total critical care time: 35 minutes  Critical care time was exclusive of separately billable procedures and treating other patients.  Critical care was necessary to treat or prevent imminent or life-threatening deterioration.  Critical care was time spent personally by me on the following activities: development of treatment plan with patient and/or surrogate as well as nursing, discussions with consultants, evaluation of patient's response to treatment, examination of patient, obtaining history from patient or surrogate, ordering and performing treatments and interventions, ordering and review of laboratory studies, ordering and review of radiographic studies, pulse oximetry and re-evaluation of patient's condition. ____________________________________________   INITIAL IMPRESSION / ASSESSMENT AND PLAN / ED COURSE  Pertinent labs & imaging results that were available during my care of the patient were reviewed by me and considered in my medical decision making (see chart for details).  74 year old female with PMH as noted above presents with altered mental status and apparently multiple falls today. The patient is somnolent and is unable to give any history.  I reviewed the past medical records in Epic. She was seen by me here in February with altered mental status likely due to polypharmacy and ultimately was discharged with home health. She was admitted again in April with a similar presentation after being found unresponsive by neighbors. She is on multiple sedating medications, with alprazolam, cyclobenzaprine, pregabalin, promethazine, and trazodone listed in her chart.  On exam currently, the patient is somnolent. She arouses to loud voice and painful stimuli and was able to say her name  and the fact she is in the hospital. She denies pain but is unable to answer most questions, falling asleep immediately. Neurologic exam is nonfocal. Her vital signs are normal except for O2 saturation in the high 80s on room air. She is doing well on nasal cannula.  Overall I suspect altered mental status due to oversedation from  her medications as with her prior presentations. Differential includes infection, other metabolic etiologies, or less likely CNS cause. We will obtain a CT head, lab work-up, and reassess.  ----------------------------------------- 6:20 PM on 11/14/2020 -----------------------------------------  O2 saturation was dipping even on nasal cannula. We gave 0.5 mg of Narcan IV, and the patient is now somewhat more alert with O2 saturation of 100% on room air.  ----------------------------------------- 9:03 PM on 11/14/2020 -----------------------------------------  Patient is somnolent again, but stable on 2 L of O2 by nasal cannula.  Lab work-up is unremarkable.  UDS is positive for tricyclics, opiates, and benzos similar to prior.  Given that the patient will likely continue to be somnolent for some time, I proceeded with admission.  I discussed her case with Dr. Arville Care from the hospitalist service.    ____________________________________________   FINAL CLINICAL IMPRESSION(S) / ED DIAGNOSES  Final diagnoses:  Medication overdose, accidental or unintentional, initial encounter      NEW MEDICATIONS STARTED DURING THIS VISIT:  New Prescriptions   No medications on file     Note:  This document was prepared using Dragon voice recognition software and may include unintentional dictation errors.    Dionne Bucy, MD 11/14/20 2115

## 2020-11-14 NOTE — ED Notes (Signed)
Pt SPO2 went down to 89%. Oxygen reapplied, 2L per [place]. Spo2 now 98%.

## 2020-11-14 NOTE — ED Notes (Signed)
Certain elements of triage assessment (questions) unable to complete due to altered mental status.

## 2020-11-14 NOTE — H&P (Addendum)
[place]   PATIENT NAME: Jessica Donovan    MR#:  [id number]  DATE OF BIRTH:  1946/05/12  DATE OF ADMISSION:  11/14/2020  PRIMARY CARE PHYSICIAN: Garlon Hatchet, MD   REQUESTING/REFERRING PHYSICIAN: Dionne Bucy, MD CHIEF COMPLAINT:   Chief Complaint  Patient presents with  . Altered Mental Status    HISTORY OF PRESENT ILLNESS:  Jessica Donovan  is a 74 y.o. female with a known history of Hypertension, coronary artery disease, type diabetes mellitus and chronic back pain, who presented to the emergency room with acute onset of altered mental status with excessive sedation.  The patient had a fall with no injuries.  She takes Xanax, Flexeril, trazodone and Phenergan among her medications.  She stated that she was reaching to get something and lost balance and fell.  She denies any presyncope or syncope.  No chest pain or palpitations or headache or dizziness or blurred vision or paresthesias or focal muscle weakness.  No cough or wheezing hemoptysis.  When she came to the ER blood pressure was 131/54 with pulse 70 of 85% on room air.  That responded to 1 mg of IV Narcan and she was up to 100% on room air but later was placed on 2 L of O2 by nasal cannula.  Latest blood pressure was 137/79.  Vital signs were otherwise within normal.  Labs revealed hyperglycemia of 215 with BUN of 32 and creatinine 1.01 and unremarkable CBC.  Influenza antigens and COVID-19 came back negative.  Tylenol levels less than 10 and salicylate less than 7.  UA showed more than 500 glucose and urine drug screen came back positive for tricyclic's opiates and benzodiazepines. Portable chest ray showed markedly suboptimal inspiration with no acute cardiopulmonary disease.  Noncontrasted head CT scan showed focal soft tissue swelling of the right parietal scalp without underlying fracture or foreign body with otherwise normal appearance for her age.EKG showed sinus rhythm with a rate of 97 with  anterolateral abuse.  The patient was given 1 mg of IV naloxone as mentioned above.  She will be medically monitored bed for further evaluation and management. PAST MEDICAL HISTORY:   Past Medical History:  Diagnosis Date  . Anxiety   . Chronic back pain   . Collagen vascular disease (HCC)   . Coronary artery disease    a. s/p PCI/DES to dRCA & mLCx in 2014 with repeat LHC in 10/2015 showing patent stents  . Diabetes mellitus without complication (HCC)   . Hypertension   . Low back pain   . Myocardial infarction (HCC)   . Osteomyelitis of toe (HCC) 01/23/2016  . Stroke [facility])     PAST SURGICAL HISTORY:   Past Surgical History:  Procedure Laterality Date  . AMPUTATION TOE Left 11/10/2015   Procedure: AMPUTATION TOE;  Surgeon: Linus Galas, MD;  Location: [facility] ORS;  Service: Podiatry;  Laterality: Left;  . AMPUTATION TOE Left 01/24/2016   Procedure: AMPUTATION TOE (2nd mpj);  Surgeon: Linus Galas, DPM;  Location: [facility] ORS;  Service: Podiatry;  Laterality: Left;  . AMPUTATION TOE Right 12/21/2018   Procedure: 2ND TOE AMPUTATION WITH DEBRIDEMENT OF SOFT TISSUE;  Surgeon: Recardo Evangelist, DPM;  Location: [facility] ORS;  Service: Podiatry;  Laterality: Right;  . CARDIAC CATHETERIZATION N/A 11/12/2015   Procedure: Left Heart Cath and Coronary Angiography;  Surgeon: Marcina Millard, MD;  Location: [facility] INVASIVE CV LAB;  Service: Cardiovascular;  Laterality: N/A;  . COLONOSCOPY WITH PROPOFOL N/A 07/20/2017   Procedure: COLONOSCOPY  WITH PROPOFOL;  Surgeon: Wyline Mood, MD;  Location: [facility] ENDOSCOPY;  Service: Endoscopy;  Laterality: N/A;  . JOINT REPLACEMENT    . TOE AMPUTATION      SOCIAL HISTORY:   Social History   Tobacco Use  . Smoking status: Never Smoker  . Smokeless tobacco: Never Used  Substance Use Topics  . Alcohol use: No    FAMILY HISTORY:   Family History  Problem Relation Age of Onset  . CAD Mother   . CAD Father     DRUG ALLERGIES:   Allergies  Allergen Reactions   . Amoxicillin Diarrhea  . Ampicillin Nausea And Vomiting and Other (See Comments)    Did it involve swelling of the face/tongue/throat, SOB, or low BP? Yes Did it involve sudden or severe rash/hives, skin peeling, or any reaction on the inside of your mouth or nose? No Did you need to seek medical attention at a hospital or doctor's office? No When did it last happen?>10 years If all above answers are "NO", may proceed with cephalosporin use.   . Augmentin [Amoxicillin-Pot Clavulanate] Diarrhea  . Bactrim [Sulfamethoxazole-Trimethoprim] Other (See Comments)    N/V/D  . Codeine Nausea And Vomiting  . Flu Virus Vaccine Other (See Comments)    Reaction: Passed out for 12 days  . Influenza Vaccines Other (See Comments)    Reaction:  Caused pt to pass out   . Methadone Hives and Itching  . Oxycodone-Acetaminophen Nausea And Vomiting  . Percocet [Oxycodone-Acetaminophen] Nausea And Vomiting  . Sulfa Antibiotics Other (See Comments)    Reaction: unknown  . Tetanus Antitoxin Swelling and Other (See Comments)    Reaction: injection site swelling  . Tetanus Toxoids Swelling and Other (See Comments)    Reaction:  Swelling at injection site  . Penicillin G Rash    Has patient had a PCN reaction causing immediate rash, facial/tongue/throat swelling, SOB or lightheadedness with hypotension: Yes Has patient had a PCN reaction causing severe rash involving mucus membranes or skin necrosis: No Has patient had a PCN reaction that required hospitalization: No Has patient had a PCN reaction occurring within the last 10 years: No If all of the above answers are "NO", then may proceed with Cephalosporin use..  . Tetracyclines & Related Rash    REVIEW OF SYSTEMS:   ROS As per history of present illness. All pertinent systems were reviewed above. Constitutional, HEENT, cardiovascular, respiratory, GI, GU, musculoskeletal, neuro, psychiatric, endocrine, integumentary and hematologic systems were  reviewed and are otherwise negative/unremarkable except for positive findings mentioned above in the HPI.   MEDICATIONS AT HOME:   Prior to Admission medications   Medication Sig Start Date End Date Taking? Authorizing Provider  acetaminophen (TYLENOL) 500 MG tablet Take 1,000 mg by mouth every 8 (eight) hours as needed for mild pain or headache.     [provider]  ALPRAZolam Prudy Feeler) 1 MG tablet Take 1 tablet (1 mg total) by mouth 2 (two) times daily as needed for anxiety. Patient taking differently: Take 1 mg by mouth at bedtime as needed for anxiety.  02/09/20   Lynn Ito, MD  AMBULATORY NON FORMULARY MEDICATION Compounded estrogen  Apply 1 gram vaginally M,W, Friday [facility] 01/12/20   Vanna Scotland, MD  aspirin 81 MG tablet Take 81 mg by mouth daily.    [provider]  atorvastatin (LIPITOR) 20 MG tablet Take 20 mg by mouth daily.     [provider]  AZO CRANBERRY GUMMIES PO Take by mouth.  2 gummies daily    [provider]  Biotin 1 MG CAPS Take 1 mg by mouth as directed.    [provider]  Cyanocobalamin (VITAMIN B-12 PO) Take by mouth daily. 2 gummies daily    [provider]  cyclobenzaprine (FLEXERIL) 10 MG tablet Take 10 mg by mouth 3 (three) times daily as needed.  02/12/20   [provider]  diphenoxylate-atropine (LOMOTIL) 2.5-0.025 MG tablet Take 1 tablet by mouth every 4 (four) hours as needed for diarrhea or loose stools.     [provider]  ferrous sulfate 325 (65 FE) MG tablet Take 325 mg by mouth daily with breakfast.    [provider]  fesoterodine (TOVIAZ) 8 MG TB24 tablet Take 8 mg by mouth daily.    [provider]  HUMALOG KWIKPEN 100 UNIT/ML KwikPen Inject 10 Units into the skin 3 (three) times daily. Pt just on sliding scale with meals 01/19/20   [provider]  insulin aspart (NOVOLOG) 100 UNIT/ML injection Inject 3-15 Units into the skin 3 (three)  times daily with meals as needed for high blood sugar. Pt uses as needed per sliding scale:    Less than 140:  0 units  140-180:  3 units 181-220:  4 units 221- 260:  6 units 261- 320:  8 units 321-360:  10 units 361-400:  12 units Greater than 400:  15 units    [provider]  metoprolol tartrate (LOPRESSOR) 25 MG tablet Take 0.5 tablets (12.5 mg total) by mouth 2 (two) times daily. Patient taking differently: Take 25 mg by mouth 2 (two) times daily.  02/09/20   Lynn Ito, MD  mirtazapine (REMERON) 15 MG tablet Take 15 mg by mouth at bedtime.    [provider]  morphine (MSIR) 15 MG tablet Take 1 tablet (15 mg total) by mouth 2 (two) times daily. Patient taking differently: Take 15 mg by mouth every 4 (four) hours as needed.  02/09/20   Lynn Ito, MD  Multiple Vitamins-Minerals (HAIR SKIN AND NAILS FORMULA PO) Take by mouth daily. 2 gummies daily    [provider]  omeprazole (PRILOSEC) 20 MG capsule Take 20 mg by mouth daily.    [provider]  pregabalin (LYRICA) 150 MG capsule Take 150 mg by mouth in the morning, at noon, and at bedtime.    [provider]  promethazine (PHENERGAN) 25 MG tablet Take 25 mg by mouth every 4 (four) hours as needed for nausea or vomiting.    [provider]  TOUJEO SOLOSTAR 300 UNIT/ML SOPN Inject 50 Units into the skin daily. 08/08/19   [provider]  traZODone (DESYREL) 100 MG tablet Take 100 mg by mouth at bedtime.    [provider]  venlafaxine XR (EFFEXOR-XR) 75 MG 24 hr capsule Take 75 mg by mouth 3 (three) times daily.    [provider]  Vitamin D, Ergocalciferol, (DRISDOL) 50000 units CAPS capsule Take 50,000 Units by mouth every Sunday.     [provider]      VITAL SIGNS:  Blood pressure (!) 150/65, pulse 89, resp. rate 15, height 5\' 5"  (1.651 m), weight 90.7 kg, SpO2 97 %.  PHYSICAL EXAMINATION:  Physical Exam  GENERAL:  74 y.o.-year-old  Caucasian female patient lying in the bed with no acute distress.  She was very somnolent but arousable. EYES: Pupils equal, round, reactive to light and accommodation. No scleral icterus. Extraocular muscles intact.  HEENT: Head atraumatic, normocephalic.  Oropharynx and nasopharynx clear.  NECK:  Supple, no jugular venous distention. No thyroid enlargement, no tenderness.  LUNGS: Normal breath sounds bilaterally, no wheezing, rales,rhonchi or crepitation. No use of accessory muscles of respiration.  CARDIOVASCULAR: Regular rate and rhythm, S1, S2 normal. No murmurs, rubs, or gallops.  ABDOMEN: Soft, nondistended, nontender. Bowel sounds present. No organomegaly or mass.  EXTREMITIES: No pedal edema, cyanosis, or clubbing.  NEUROLOGIC: Cranial nerves II through XII are intact. Muscle strength 5/5 in all extremities. Sensation intact. Gait not checked.  PSYCHIATRIC: The patient is alert and oriented x 3.  She is very somnolent but arousable.  Normal affect and good eye contact. SKIN: No obvious rash, lesion, or ulcer.   LABORATORY PANEL:   CBC Recent Labs  Lab 11/14/20 1814  WBC 8.5  HGB 13.5  HCT 41.8  PLT 244   ------------------------------------------------------------------------------------------------------------------  Chemistries  Recent Labs  Lab 11/14/20 1814  NA 137  K 4.6  CL 100  CO2 27  GLUCOSE 215*  BUN 32*  CREATININE 1.01*  CALCIUM 9.5  AST 38  ALT 14  ALKPHOS 80  BILITOT 0.7   ------------------------------------------------------------------------------------------------------------------  Cardiac Enzymes No results for input(s): TROPONINI in the last 168 hours. ------------------------------------------------------------------------------------------------------------------  RADIOLOGY:  CT Head Wo Contrast  Result Date: 11/14/2020 CLINICAL DATA:  Mental status change, unknown cause.  Fall. EXAM: CT HEAD WITHOUT CONTRAST TECHNIQUE: Contiguous  axial images were obtained from the base of the skull through the vertex without intravenous contrast. COMPARISON:  CT head without contrast 03/27/2020 FINDINGS: Brain: Mild generalized atrophy and white matter changes are within normal limits for age. No acute infarct, hemorrhage, or mass lesion is present. The ventricles are of normal size. No significant extraaxial fluid collection is present. The brainstem and cerebellum are within normal limits. Vascular: Atherosclerotic calcifications are present in the cavernous internal carotid arteries bilaterally without a hyperdense vessel. Skull: Focal soft tissue swelling is present in the right parietal scalp centered on image 58 of series 2. No underlying fracture is present. No foreign body is present. Sinuses/Orbits: The paranasal sinuses and mastoid air cells are clear. Bilateral lens replacements are noted. Globes and orbits are otherwise unremarkable. IMPRESSION: 1. Focal soft tissue swelling in the right parietal scalp without underlying fracture or foreign body. 2. Normal CT appearance of the brain for age. Electronically Signed   By: Marin Robertshristopher  Mattern M.D.   On: 11/14/2020 19:46   DG Chest Portable 1 View  Result Date: 11/14/2020 CLINICAL DATA:  74 year old who fell and is somnolent. Hyperglycemia with blood sugar of 239. EXAM: PORTABLE CHEST 1 VIEW COMPARISON:  03/27/2020 and earlier, including CT chest 10/20/2019. FINDINGS: Markedly suboptimal inspiration (near expiratory image) which accounts for crowded bronchovascular markings diffusely and accentuates the heart size. Taking this into account, cardiac silhouette likely upper normal in size. Lungs clear. No visible pleural effusions. IMPRESSION: Markedly suboptimal inspiration. No acute cardiopulmonary disease. Electronically Signed   By: Hulan Saashomas  Lawrence M.D.   On: 11/14/2020 18:39      IMPRESSION AND PLAN:   1.  Acute encephalopathy like secondary to polypharmacy with subsequent  fall. -Patient will be admitted to an observation medical monitored bed. -We will hold off her sedative medications. -Neurochecks will be followed every 4 hours for 24 hours. -We will utilize IV Narcan as needed as it has already helped her.  2.  Dyslipidemia. -We will continue statin therapy.  3.  Type 2 diabetes mellitus. -We will place on supplemental coverage with NovoLog and continue basal  coverage.  4.  Essential hypertension. -We will continue her antihypertensives.  5.  GERD. -Continue PPI therapy.  6.  Peripheral neuropathy. -We will continue Lyrica with improvement of her sedation.  7.  DVT prophylaxis. -Subcutaneous Lovenox.    All the records are reviewed and case discussed with ED provider. The plan of care was discussed in details with the patient (and family). I answered all questions. The patient agreed to proceed with the above mentioned plan. Further management will depend upon hospital course.   CODE STATUS: The patient is DNR/DNI.  Status is: Observation  The patient remains OBS appropriate and will d/c before 2 midnights.  Dispo: The patient is from: Home              Anticipated d/c is to: Home              Anticipated d/c date is: 1 day              Patient currently is not medically stable to d/c.   TOTAL TIME TAKING CARE OF THIS PATIENT: 55 minutes.    Hannah Beat M.D on 11/14/2020 at 10:45 PM  [facility]   From 7 PM-7 AM, contact night-coverage [email or web address]  CC: Primary care physician; Garlon Hatchet, MD

## 2020-11-15 ENCOUNTER — Other Ambulatory Visit: Payer: Self-pay

## 2020-11-15 DIAGNOSIS — Z880 Allergy status to penicillin: Secondary | ICD-10-CM | POA: Diagnosis not present

## 2020-11-15 DIAGNOSIS — G8929 Other chronic pain: Secondary | ICD-10-CM | POA: Diagnosis present

## 2020-11-15 DIAGNOSIS — M545 Low back pain, unspecified: Secondary | ICD-10-CM | POA: Diagnosis present

## 2020-11-15 DIAGNOSIS — E785 Hyperlipidemia, unspecified: Secondary | ICD-10-CM | POA: Diagnosis present

## 2020-11-15 DIAGNOSIS — Z882 Allergy status to sulfonamides status: Secondary | ICD-10-CM | POA: Diagnosis not present

## 2020-11-15 DIAGNOSIS — E1165 Type 2 diabetes mellitus with hyperglycemia: Secondary | ICD-10-CM | POA: Diagnosis present

## 2020-11-15 DIAGNOSIS — Z79899 Other long term (current) drug therapy: Secondary | ICD-10-CM | POA: Diagnosis not present

## 2020-11-15 DIAGNOSIS — Z887 Allergy status to serum and vaccine status: Secondary | ICD-10-CM | POA: Diagnosis not present

## 2020-11-15 DIAGNOSIS — Z885 Allergy status to narcotic agent status: Secondary | ICD-10-CM | POA: Diagnosis not present

## 2020-11-15 DIAGNOSIS — T50901A Poisoning by unspecified drugs, medicaments and biological substances, accidental (unintentional), initial encounter: Secondary | ICD-10-CM | POA: Diagnosis not present

## 2020-11-15 DIAGNOSIS — E1142 Type 2 diabetes mellitus with diabetic polyneuropathy: Secondary | ICD-10-CM | POA: Diagnosis present

## 2020-11-15 DIAGNOSIS — I2510 Atherosclerotic heart disease of native coronary artery without angina pectoris: Secondary | ICD-10-CM | POA: Diagnosis present

## 2020-11-15 DIAGNOSIS — G928 Other toxic encephalopathy: Secondary | ICD-10-CM | POA: Diagnosis present

## 2020-11-15 DIAGNOSIS — I10 Essential (primary) hypertension: Secondary | ICD-10-CM | POA: Diagnosis present

## 2020-11-15 DIAGNOSIS — Z955 Presence of coronary angioplasty implant and graft: Secondary | ICD-10-CM | POA: Diagnosis not present

## 2020-11-15 DIAGNOSIS — Z66 Do not resuscitate: Secondary | ICD-10-CM | POA: Diagnosis present

## 2020-11-15 DIAGNOSIS — Z8673 Personal history of transient ischemic attack (TIA), and cerebral infarction without residual deficits: Secondary | ICD-10-CM | POA: Diagnosis not present

## 2020-11-15 DIAGNOSIS — T43215A Adverse effect of selective serotonin and norepinephrine reuptake inhibitors, initial encounter: Secondary | ICD-10-CM | POA: Diagnosis present

## 2020-11-15 DIAGNOSIS — F419 Anxiety disorder, unspecified: Secondary | ICD-10-CM | POA: Diagnosis present

## 2020-11-15 DIAGNOSIS — T424X5A Adverse effect of benzodiazepines, initial encounter: Secondary | ICD-10-CM | POA: Diagnosis present

## 2020-11-15 DIAGNOSIS — K219 Gastro-esophageal reflux disease without esophagitis: Secondary | ICD-10-CM | POA: Diagnosis present

## 2020-11-15 DIAGNOSIS — T481X5A Adverse effect of skeletal muscle relaxants [neuromuscular blocking agents], initial encounter: Secondary | ICD-10-CM | POA: Diagnosis present

## 2020-11-15 DIAGNOSIS — Z9181 History of falling: Secondary | ICD-10-CM | POA: Diagnosis not present

## 2020-11-15 DIAGNOSIS — I252 Old myocardial infarction: Secondary | ICD-10-CM | POA: Diagnosis not present

## 2020-11-15 DIAGNOSIS — Z20822 Contact with and (suspected) exposure to covid-19: Secondary | ICD-10-CM | POA: Diagnosis present

## 2020-11-15 LAB — CBC
HCT: 41 % (ref 36.0–46.0)
Hemoglobin: 13.3 g/dL (ref 12.0–15.0)
MCH: 32 pg (ref 26.0–34.0)
MCHC: 32.4 g/dL (ref 30.0–36.0)
MCV: 98.6 fL (ref 80.0–100.0)
Platelets: 203 10*3/uL (ref 150–400)
RBC: 4.16 MIL/uL (ref 3.87–5.11)
RDW: 12.6 % (ref 11.5–15.5)
WBC: 6.1 10*3/uL (ref 4.0–10.5)
nRBC: 0 % (ref 0.0–0.2)

## 2020-11-15 LAB — BASIC METABOLIC PANEL
Anion gap: 7 (ref 5–15)
BUN: 20 mg/dL (ref 8–23)
CO2: 28 mmol/L (ref 22–32)
Calcium: 8.6 mg/dL — ABNORMAL LOW (ref 8.9–10.3)
Chloride: 106 mmol/L (ref 98–111)
Creatinine, Ser: 0.77 mg/dL (ref 0.44–1.00)
GFR, Estimated: >60 mL/min (ref >60)
Glucose, Bld: 259 mg/dL — ABNORMAL HIGH (ref 70–99)
Potassium: 4.1 mmol/L (ref 3.5–5.1)
Sodium: 141 mmol/L (ref 135–145)

## 2020-11-15 LAB — CBG MONITORING, ED
Glucose-Capillary: 219 mg/dL — ABNORMAL HIGH (ref 70–99)
Glucose-Capillary: 219 mg/dL — ABNORMAL HIGH (ref 70–99)

## 2020-11-15 MED ORDER — MELATONIN 5 MG PO TABS
5.0000 mg | ORAL_TABLET | Freq: Every evening | ORAL | Status: DC | PRN
  Administered 2020-11-15: 5 mg via ORAL
  Filled 2020-11-15: qty 1

## 2020-11-15 MED ORDER — DIPHENHYDRAMINE HCL 25 MG PO CAPS
25.0000 mg | ORAL_CAPSULE | Freq: Every evening | ORAL | Status: DC | PRN
  Administered 2020-11-16: 25 mg via ORAL
  Filled 2020-11-15: qty 1

## 2020-11-15 MED ORDER — INSULIN GLARGINE 100 UNIT/ML ~~LOC~~ SOLN
50.0000 [IU] | Freq: Every day | SUBCUTANEOUS | Status: DC
  Administered 2020-11-15 – 2020-11-16 (×2): 50 [IU] via SUBCUTANEOUS
  Filled 2020-11-15 (×4): qty 0.5

## 2020-11-15 MED ORDER — ALPRAZOLAM 0.5 MG PO TABS
0.2500 mg | ORAL_TABLET | Freq: Two times a day (BID) | ORAL | Status: DC | PRN
  Administered 2020-11-15 – 2020-11-17 (×5): 0.25 mg via ORAL
  Filled 2020-11-15 (×5): qty 1

## 2020-11-15 NOTE — ED Notes (Signed)
Advised nurse that patient has assgied bed

## 2020-11-15 NOTE — ED Notes (Signed)
Pharmacy states the hospital does not stock the ordered insulin for this patient. Message sent to NP Katherina Right to determine which long acting insulin she would prefer for patient. RN awaiting response.

## 2020-11-15 NOTE — Consult Note (Signed)
WOC Nurse Consult Note: Reason for Consult: left heel wound Patient with DM, reports history of a year ago trauma to the area. Followed by podiatry outpatient  Wound type: trauma in the presence of neuropathy  Pressure Injury POA:NA Measurement: 1.5cm x 1.0cm x 0.2cm  Wound bed:100% pink, clean, moist Drainage (amount, consistency, odor) scant, no odor Periwound: intact  Dressing procedure/placement/frequency: amorphous saline gel (hydrogel) daily and dry dressing. Updated orders.   Follow up with podiatry outpatient as scheduled.   Discussed POC with patient and bedside nurse.  Re consult if needed, will not follow at this time. Thanks  Issa Kosmicki [employer], RN,CWOCN, CNS, CWON-AP [phone])

## 2020-11-15 NOTE — Evaluation (Signed)
Physical Therapy Evaluation Patient Details Name: Jessica Donovan MRN: [id number] DOB: 1946/09/13 Today's Date: 11/15/2020   History of Present Illness  presented ot ER secondary to AMS, excessive sedation; admitted for management of acute metabolic encephalopathy due to polypharmacy.  S/p fall in home environment (no injuries reported)  Clinical Impression  Upon evaluation, patient alert and oriented to basic information; follows commands and agreeable to session.  Endorses chronic pain in low back, but denies acute pain related to recent fall.  Bilat UE/LE strength and ROM grossly symmetrical and WFL (L UE with chronic RTC deficits); appropriate for basic transfers and gait efforts.  Currently completes bed mobility with cga/close sup; sit/stand, basic transfers and gait (30' x1, 15' x2) with RW, cga/min assist.  Demonstrates forward flexed posture, downward gaze; mod reliance on RW.  Decreased step height/length, decreased heel strike/toe off.  Slow and deliberate, but no buckling or LOB. Moderate balance deficits with dynamic standing activities noted, but Fair/good awareness of limits of stability, using UEs for external stabilization or to extend functional reach as needed.  Do recommend continued use of RW with all mobility, even inside the home, for optimal safety/indep with functional activities. Would benefit from skilled PT to address above deficits and promote optimal return to PLOF.; Recommend transition to HHPT upon discharge from acute hospitalization.     Follow Up Recommendations Home health PT    Equipment Recommendations   (has 4WRW in the home)    Recommendations for Other Services       Precautions / Restrictions Precautions Precautions: Fall Precaution Comments: L heel wound, seeing podiatry outpatient (denies having specialized footwear for protection) Restrictions Weight Bearing Restrictions: No      Mobility  Bed Mobility Overal bed mobility: Needs  Assistance Bed Mobility: Supine to Sit     Supine to sit: Min guard          Transfers Overall transfer level: Needs assistance Equipment used: Rolling walker (2 wheeled) Transfers: Sit to/from Stand Sit to Stand: Min guard;Min assist         General transfer comment: cuing for hand placement to prevent pulling on RW  Ambulation/Gait Ambulation/Gait assistance: Min guard Gait Distance (Feet):  (30' x1, 15' x2) Assistive device: Rolling walker (2 wheeled)       General Gait Details: forward flexed posture, downward gaze; mod reliance on RW.  Decreased step height/length, decreased heel strike/toe off.  Slow and deliberate, but no buckling or LOB  Stairs            Wheelchair Mobility    Modified Rankin (Stroke Patients Only)       Balance Overall balance assessment: Needs assistance Sitting-balance support: No upper extremity supported;Feet supported Sitting balance-Leahy Scale: Good     Standing balance support: Bilateral upper extremity supported Standing balance-Leahy Scale: Fair                               Pertinent Vitals/Pain Pain Assessment: Faces Faces Pain Scale: Hurts little more Pain Location: chronic back Pain Descriptors / Indicators: Grimacing;Aching Pain Intervention(s): Limited activity within patient's tolerance;Monitored during session;Repositioned    Home Living Family/patient expects to be discharged to:: Private residence Living Arrangements: Alone Available Help at Discharge: Available PRN/intermittently;Neighbor;Friend(s) Type of Home: Apartment Home Access: Level entry     Home Layout: One level Home Equipment: Walker - 2 wheels;Walker - 4 wheels;Cane - single point;Wheelchair - power;Other (comment);Tub bench;Shower seat  Prior Function Level of Independence: Needs assistance         Comments: Sup/mod indep for ADLs, household mobilization; 'furniture cruises' in the home, 4WRW for limited  community distances.  Multiple fall history, most recent outside of this admission approx one year prior per patient.  No driving; friends/neighbors assist with groceries and community errands.     Hand Dominance        Extremity/Trunk Assessment   Upper Extremity Assessment Upper Extremity Assessment: Overall WFL for tasks assessed    Lower Extremity Assessment Lower Extremity Assessment: Overall WFL for tasks assessed (grossly at least 4/5 throughout)       Communication   Communication: No difficulties  Cognition Arousal/Alertness: Awake/alert Behavior During Therapy: WFL for tasks assessed/performed Overall Cognitive Status: Within Functional Limits for tasks assessed                                        General Comments      Exercises Other Exercises Other Exercises: Toilet transfer, ambulatoryw ith RW, cga/close sup; sit/stand from standard height toilet with grab bar, cga/close sup. Does require UE support to safely complete Other Exercises: Standing balance at sink for hand hygiene, cga/close sup. Fair/good awareness of limits of stability, using UEs for external stabilization or to extend functional reach as needed.   Assessment/Plan    PT Assessment Patient needs continued PT services  PT Problem List Decreased strength;Decreased activity tolerance;Decreased balance;Decreased mobility;Decreased coordination;Decreased knowledge of use of DME;Decreased safety awareness;Decreased knowledge of precautions;Pain       PT Treatment Interventions DME instruction;Gait training;Functional mobility training;Therapeutic activities;Therapeutic exercise;Balance training;Patient/family education    PT Goals (Current goals can be found in the Care Plan section)  Acute Rehab PT Goals Patient Stated Goal: to build my strength back up PT Goal Formulation: With patient Time For Goal Achievement: 11/29/20 Potential to Achieve Goals: Good    Frequency Min  2X/week   Barriers to discharge        Co-evaluation               AM-PAC PT "6 Clicks" Mobility  Outcome Measure Help needed turning from your back to your side while in a flat bed without using bedrails?: None Help needed moving from lying on your back to sitting on the side of a flat bed without using bedrails?: A Little Help needed moving to and from a bed to a chair (including a wheelchair)?: A Little Help needed standing up from a chair using your arms (e.g., wheelchair or bedside chair)?: A Little Help needed to walk in hospital room?: A Little Help needed climbing 3-5 steps with a railing? : A Little 6 Click Score: 19    End of Session Equipment Utilized During Treatment: Gait belt Activity Tolerance: Patient tolerated treatment well Patient left: in bed;with call bell/phone within reach Nurse Communication: Mobility status PT Visit Diagnosis: Muscle weakness (generalized) (M62.81);Difficulty in walking, not elsewhere classified (R26.2);History of falling (Z91.81)    Time: 9379-0240 PT Time Calculation (min) (ACUTE ONLY): 32 min   Charges:   PT Evaluation $PT Eval Moderate Complexity: 1 Mod PT Treatments $Therapeutic Activity: 8-22 mins        Angelik Walls H. Manson Passey, PT, DPT, NCS 11/15/20, 2:05 PM [phone]

## 2020-11-15 NOTE — ED Notes (Signed)
Pt accidentally dropped her unsweat tea on floor from lunch. Floor dried. EVS to stop by room soon to thoroughly clean floor. Pt given new warm blanket.

## 2020-11-15 NOTE — ED Notes (Signed)
Pt now awake and alert. Denies abd pain, denies chest pain, denies SOB. Reports feeling weak still. Pt breathing unlabored and speaking in full sentences. Symmetric chest rise and fall noted.

## 2020-11-15 NOTE — ED Notes (Signed)
Pt on bedpan this tech went to remove bedpan and pt states, "I think I overfilled the pan because I feel like the bed is wet". This writer changed pts linens, placed clean chucks, and placed new brief on pt.

## 2020-11-15 NOTE — ED Notes (Signed)
Advised nurse that patient has assigned bed 

## 2020-11-15 NOTE — ED Notes (Signed)
Advused nurse that patient has assigned bed

## 2020-11-15 NOTE — Progress Notes (Signed)
PROGRESS NOTE    Jessica Donovan  NLG:[id number] DOB: 1946-08-26 DOA: 11/14/2020 PCP: Garlon Hatchet, MD    Brief Narrative:  [phone] with hx DM2, chronic back pain on multiple sedating meds such as xanax, flexeril, trazodone who was brought to the ED by EMS after being found somnolent by neighbor with concerns of polypharmacy. Pt reportedly did show improvement in the ED after receiving narcan  Assessment & Plan:   Active Problems:   Fall  1.  Acute toxic metabolic encephalopathy like secondary to polypharmacy with subsequent fall. -Chart reviewed. Pt reportedly was found down and somnolent, improved somewhat after receiving narcan in the ED -This AM, pt seems seemed to confabulate a story about the events leading to her hospitalization. Pt claims she fell after a door handle got caught in her outerwear without LOC or injury and that she subsequently called EMS, contradicting documented events from triage ED staff -When patient was made aware of her neighbor's involvement, pt became upset with her neighbor for checking on her and contacting EMS -This AM, patient seems tired-appearing, at times confused  -Have consulted PT to eval. -Have decreased xanax to 0.25mg  BID PRN anxiety  2.  Dyslipidemia. -Continue with statin therapy.  3.  Type 2 diabetes mellitus. -Continue on supplemental coverage with NovoLog and continue basal coverage.  4.  Essential hypertension. -BP currently stable and controlled -Pt is continued on metoprolol 25mg  bid  5.  GERD. -Continue with protonix  6.  Peripheral neuropathy. -Continue with supportive care  7.  DVT prophylaxis. -Subcutaneous Lovenox.   DVT prophylaxis: Lovenox subq Code Status: DNR Family Communication: Pt in room, family not at bedside  Status is: Inpatient  Remains inpatient appropriate because:Altered mental status, Unsafe d/c plan and Inpatient level of care appropriate due to severity of illness   Dispo: The  patient is from: Home              Anticipated d/c is to: Home              Anticipated d/c date is: 2 days              Patient currently is not medically stable to d/c.       Consultants:     Procedures:     Antimicrobials: Anti-infectives (From admission, onward)   None       Subjective: Somewhat confused this AM and had difficulty figuring out what lead to her hospitalization  Objective: Vitals:   11/15/20 1330 11/15/20 1345 11/15/20 1400 11/15/20 1500  BP: (!) 122/54  (!) 135/106 95/67  Pulse: 69 70    Resp: (!) 21 13 (!) 26 17  Temp:      TempSrc:      SpO2:  94%    Weight:      Height:       No intake or output data in the 24 hours ending 11/15/20 1628 Filed Weights   11/14/20 1808  Weight: 90.7 kg    Examination:  General exam: Appears calm and comfortable  Respiratory system: Clear to auscultation. Respiratory effort normal. Cardiovascular system: S1 & S2 heard, Regular Gastrointestinal system: Abdomen is nondistended, soft and nontender. No organomegaly or masses felt. Normal bowel sounds heard. Central nervous system: drowsy No focal neurological deficits. Extremities: Symmetric 5 x 5 power. Skin: No rashes, lesions Psychiatry: Difficult to assess given mentation.   Data Reviewed: I have personally reviewed following labs and imaging studies  CBC: Recent Labs  Lab 11/14/20 1814 11/15/20 0527  WBC 8.5 6.1  NEUTROABS 5.4  --   HGB 13.5 13.3  HCT 41.8 41.0  MCV 99.5 98.6  PLT 244 203   Basic Metabolic Panel: Recent Labs  Lab 11/14/20 1814 11/15/20 0527  NA 137 141  K 4.6 4.1  CL 100 106  CO2 27 28  GLUCOSE 215* 259*  BUN 32* 20  CREATININE 1.01* 0.77  CALCIUM 9.5 8.6*   GFR: Estimated Creatinine Clearance: 68.7 mL/min (by C-G formula based on SCr of 0.77 mg/dL). Liver Function Tests: Recent Labs  Lab 11/14/20 1814  AST 38  ALT 14  ALKPHOS 80  BILITOT 0.7  PROT 7.1  ALBUMIN 4.3   No results for input(s): LIPASE,  AMYLASE in the last 168 hours. No results for input(s): AMMONIA in the last 168 hours. Coagulation Profile: No results for input(s): INR, PROTIME in the last 168 hours. Cardiac Enzymes: No results for input(s): CKTOTAL, CKMB, CKMBINDEX, TROPONINI in the last 168 hours. BNP (last 3 results) No results for input(s): PROBNP in the last 8760 hours. HbA1C: No results for input(s): HGBA1C in the last 72 hours. CBG: Recent Labs  Lab 11/15/20 0009 11/15/20 0845  GLUCAP 219* 219*   Lipid Profile: No results for input(s): CHOL, HDL, LDLCALC, TRIG, CHOLHDL, LDLDIRECT in the last 72 hours. Thyroid Function Tests: No results for input(s): TSH, T4TOTAL, FREET4, T3FREE, THYROIDAB in the last 72 hours. Anemia Panel: No results for input(s): VITAMINB12, FOLATE, FERRITIN, TIBC, IRON, RETICCTPCT in the last 72 hours. Sepsis Labs: No results for input(s): PROCALCITON, LATICACIDVEN in the last 168 hours.  Recent Results (from the past 240 hour(s))  Resp Panel by RT-PCR (Flu A&B, Covid) Nasopharyngeal Swab     Status: None   Collection Time: 11/14/20  9:41 PM   Specimen: Nasopharyngeal Swab; Nasopharyngeal(NP) swabs in vial transport medium  Result Value Ref Range Status   SARS Coronavirus 2 by RT PCR NEGATIVE NEGATIVE Final    Comment: (NOTE) SARS-CoV-2 target nucleic acids are NOT DETECTED.  The SARS-CoV-2 RNA is generally detectable in upper respiratory specimens during the acute phase of infection. The lowest concentration of SARS-CoV-2 viral copies this assay can detect is 138 copies/mL. A negative result does not preclude SARS-Cov-2 infection and should not be used as the sole basis for treatment or other patient management decisions. A negative result may occur with  improper specimen collection/handling, submission of specimen other than nasopharyngeal swab, presence of viral mutation(s) within the areas targeted by this assay, and inadequate number of viral copies(<138 copies/mL). A  negative result must be combined with clinical observations, patient history, and epidemiological information. The expected result is Negative.  Fact Sheet for Patients:  BloggerCourse.com  Fact Sheet for Healthcare Providers:  SeriousBroker.it  This test is no t yet approved or cleared by the [place] FDA and  has been authorized for detection and/or diagnosis of SARS-CoV-2 by FDA under an Emergency Use Authorization (EUA). This EUA will remain  in effect (meaning this test can be used) for the duration of the COVID-19 declaration under Section 564(b)(1) of the Act, 21 U.S.C.section 360bbb-3(b)(1), unless the authorization is terminated  or revoked sooner.       Influenza A by PCR NEGATIVE NEGATIVE Final   Influenza B by PCR NEGATIVE NEGATIVE Final    Comment: (NOTE) The Xpert Xpress SARS-CoV-2/FLU/RSV plus assay is intended as an aid in the diagnosis of influenza from Nasopharyngeal swab specimens and should not be used as a sole basis for treatment. Nasal washings and  aspirates are unacceptable for Xpert Xpress SARS-CoV-2/FLU/RSV testing.  Fact Sheet for Patients: BloggerCourse.com  Fact Sheet for Healthcare Providers: SeriousBroker.it  This test is not yet approved or cleared by the [place] FDA and has been authorized for detection and/or diagnosis of SARS-CoV-2 by FDA under an Emergency Use Authorization (EUA). This EUA will remain in effect (meaning this test can be used) for the duration of the COVID-19 declaration under Section 564(b)(1) of the Act, 21 U.S.C. section 360bbb-3(b)(1), unless the authorization is terminated or revoked.  Performed at [facility], [address] [place], [place] [address]      Radiology Studies: CT Head Wo Contrast  Result Date: 11/14/2020 CLINICAL DATA:  Mental status change, unknown cause.  Fall. EXAM: CT  HEAD WITHOUT CONTRAST TECHNIQUE: Contiguous axial images were obtained from the base of the skull through the vertex without intravenous contrast. COMPARISON:  CT head without contrast 03/27/2020 FINDINGS: Brain: Mild generalized atrophy and white matter changes are within normal limits for age. No acute infarct, hemorrhage, or mass lesion is present. The ventricles are of normal size. No significant extraaxial fluid collection is present. The brainstem and cerebellum are within normal limits. Vascular: Atherosclerotic calcifications are present in the cavernous internal carotid arteries bilaterally without a hyperdense vessel. Skull: Focal soft tissue swelling is present in the right parietal scalp centered on image 58 of series 2. No underlying fracture is present. No foreign body is present. Sinuses/Orbits: The paranasal sinuses and mastoid air cells are clear. Bilateral lens replacements are noted. Globes and orbits are otherwise unremarkable. IMPRESSION: 1. Focal soft tissue swelling in the right parietal scalp without underlying fracture or foreign body. 2. Normal CT appearance of the brain for age. Electronically Signed   By: Marin Roberts M.D.   On: 11/14/2020 19:46   DG Chest Portable 1 View  Result Date: 11/14/2020 CLINICAL DATA:  74 year old who fell and is somnolent. Hyperglycemia with blood sugar of 239. EXAM: PORTABLE CHEST 1 VIEW COMPARISON:  03/27/2020 and earlier, including CT chest 10/20/2019. FINDINGS: Markedly suboptimal inspiration (near expiratory image) which accounts for crowded bronchovascular markings diffusely and accentuates the heart size. Taking this into account, cardiac silhouette likely upper normal in size. Lungs clear. No visible pleural effusions. IMPRESSION: Markedly suboptimal inspiration. No acute cardiopulmonary disease. Electronically Signed   By: Hulan Saas M.D.   On: 11/14/2020 18:39    Scheduled Meds: . aspirin EC  81 mg Oral Daily  . atorvastatin   20 mg Oral Daily  . enoxaparin (LOVENOX) injection  40 mg Subcutaneous Q2200  . ferrous sulfate  325 mg Oral Q breakfast  . fesoterodine  8 mg Oral Daily  . insulin glargine  50 Units Subcutaneous Daily  . metoprolol tartrate  25 mg Oral BID  . mirtazapine  15 mg Oral QHS  . pantoprazole  40 mg Oral Daily  . venlafaxine XR  75 mg Oral TID  . vitamin B-12  100 mcg Oral Daily   Continuous Infusions: . sodium chloride 100 mL/hr at 11/14/20 2259     LOS: 0 days   Rickey Tonji, MD [facility] Pager On Amion  If 7PM-7AM, please contact night-coverage 11/15/2020, 4:28 PM

## 2020-11-15 NOTE — ED Notes (Signed)
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 Pt given basic tray from fridge since she was sent fish for lunch. Will help order new tray once able. Given new drink since pt had dropped tea on floor. EVS cleaned floor.

## 2020-11-15 NOTE — ED Notes (Signed)
Will give scheduled meds if/when verified and received by pharm.

## 2020-11-15 NOTE — ED Notes (Signed)
Pt resting calmly in bed. Pt currently talking with pharm tech. Bed locked low. Rail up. Call bell within reach.

## 2020-11-15 NOTE — ED Notes (Signed)
Patient incontinent of urine while sleeping. Brief and bed linens changed. Peri care performed. Clothes removed and hospital gown placed. 2 Warm blankets provided. Cardiac monitor remains in place. Oxygen removed and patient maintaining oxygen saturation of 96%. Pharmacy contacted x2 requesting they send ordered insulin for administration. Bgl 216 via fingerstick.

## 2020-11-15 NOTE — ED Notes (Signed)
Patient taken off bedpan and pericare provided. RN boosted patient in bed to promote comfort. Cardiac monitor in place. Bed low and locked with side rails raised x2 and call bell in reach. Patient provided with 2 warm blankets per request and lights adjusted to promote comfort. Snack tray provided to patient as well.

## 2020-11-16 ENCOUNTER — Encounter: Payer: Self-pay | Admitting: Family Medicine

## 2020-11-16 DIAGNOSIS — T50901A Poisoning by unspecified drugs, medicaments and biological substances, accidental (unintentional), initial encounter: Secondary | ICD-10-CM | POA: Diagnosis not present

## 2020-11-16 LAB — COMPREHENSIVE METABOLIC PANEL
ALT: 12 U/L (ref 0–44)
AST: 26 U/L (ref 15–41)
Albumin: 3.5 g/dL (ref 3.5–5.0)
Alkaline Phosphatase: 57 U/L (ref 38–126)
Anion gap: 9 (ref 5–15)
BUN: 13 mg/dL (ref 8–23)
CO2: 26 mmol/L (ref 22–32)
Calcium: 9.1 mg/dL (ref 8.9–10.3)
Chloride: 106 mmol/L (ref 98–111)
Creatinine, Ser: 0.72 mg/dL (ref 0.44–1.00)
GFR, Estimated: >60 mL/min (ref >60)
Glucose, Bld: 120 mg/dL — ABNORMAL HIGH (ref 70–99)
Potassium: 3.8 mmol/L (ref 3.5–5.1)
Sodium: 141 mmol/L (ref 135–145)
Total Bilirubin: 0.6 mg/dL (ref 0.3–1.2)
Total Protein: 6.1 g/dL — ABNORMAL LOW (ref 6.5–8.1)

## 2020-11-16 LAB — HEMOGLOBIN A1C
Hgb A1c MFr Bld: 10.5 % — ABNORMAL HIGH (ref 4.8–5.6)
Mean Plasma Glucose: 254.65 mg/dL

## 2020-11-16 LAB — CBC
HCT: 39.4 % (ref 36.0–46.0)
Hemoglobin: 12.8 g/dL (ref 12.0–15.0)
MCH: 32 pg (ref 26.0–34.0)
MCHC: 32.5 g/dL (ref 30.0–36.0)
MCV: 98.5 fL (ref 80.0–100.0)
Platelets: 206 10*3/uL (ref 150–400)
RBC: 4 MIL/uL (ref 3.87–5.11)
RDW: 12.6 % (ref 11.5–15.5)
WBC: 5.7 10*3/uL (ref 4.0–10.5)
nRBC: 0 % (ref 0.0–0.2)

## 2020-11-16 LAB — GLUCOSE, CAPILLARY
Glucose-Capillary: 121 mg/dL — ABNORMAL HIGH (ref 70–99)
Glucose-Capillary: 140 mg/dL — ABNORMAL HIGH (ref 70–99)
Glucose-Capillary: 126 mg/dL — ABNORMAL HIGH (ref 70–99)
Glucose-Capillary: 251 mg/dL — ABNORMAL HIGH (ref 70–99)

## 2020-11-16 MED ORDER — INSULIN ASPART 100 UNIT/ML ~~LOC~~ SOLN
0.0000 [IU] | Freq: Three times a day (TID) | SUBCUTANEOUS | Status: DC
  Administered 2020-11-16 – 2020-11-17 (×4): 2 [IU] via SUBCUTANEOUS
  Administered 2020-11-17: 5 [IU] via SUBCUTANEOUS
  Filled 2020-11-16 (×4): qty 1

## 2020-11-16 MED ORDER — TRAZODONE HCL 100 MG PO TABS
100.0000 mg | ORAL_TABLET | Freq: Every evening | ORAL | Status: DC | PRN
  Administered 2020-11-16: 100 mg via ORAL
  Filled 2020-11-16: qty 1

## 2020-11-16 MED ORDER — INSULIN GLARGINE 100 UNIT/ML ~~LOC~~ SOLN
5.0000 [IU] | SUBCUTANEOUS | Status: AC
  Administered 2020-11-16: 5 [IU] via SUBCUTANEOUS
  Filled 2020-11-16: qty 0.05

## 2020-11-16 MED ORDER — TRAZODONE HCL 100 MG PO TABS: 100.0000 mg | ORAL_TABLET | Freq: Every day | ORAL | Status: DC

## 2020-11-16 MED ORDER — INSULIN ASPART 100 UNIT/ML ~~LOC~~ SOLN
0.0000 [IU] | Freq: Every day | SUBCUTANEOUS | Status: DC
  Administered 2020-11-16: 3 [IU] via SUBCUTANEOUS
  Filled 2020-11-16: qty 1

## 2020-11-16 NOTE — Progress Notes (Signed)
PROGRESS NOTE    Jessica Donovan  YJE:[id number] DOB: 02/19/1946 DOA: 11/14/2020 PCP: Garlon Hatchet, MD    Brief Narrative:  [phone] with hx DM2, chronic back pain on multiple sedating meds such as xanax, flexeril, trazodone who was brought to the ED by EMS after being found somnolent by neighbor with concerns of polypharmacy. Pt reportedly did show improvement in the ED after receiving narcan  Assessment & Plan:   Active Problems:   Fall  1.  Acute toxic metabolic encephalopathy like secondary to polypharmacy with subsequent fall. -Chart reviewed. Pt reportedly was found down and somnolent, improved somewhat after receiving narcan in the ED -Mentation seems improved this AM  -PT rec for HHPT -Have decreased xanax to 0.25mg  BID PRN anxiety -Recommend weaning sedating meds on an outpatient basis  2.  Dyslipidemia. -Continued with statin therapy.  3.  Type 2 diabetes mellitus. -Continue on supplemental coverage with NovoLog and continue basal coverage.  4.  Essential hypertension. -BP currently stable -Pt is continued on metoprolol 25mg  bid  5.  GERD. -Continue with protonix  6.  Peripheral neuropathy. -Continue with supportive care  7.  DVT prophylaxis. -Subcutaneous Lovenox.  8. Nausea -Pt reported nausea this AM, requiring IV antiemetic with resultant decreased PO intake -Pt had requested continuing on regular diet for now   DVT prophylaxis: Lovenox subq Code Status: DNR Family Communication: Pt in room, family not at bedside  Status is: Inpatient  Remains inpatient appropriate because:Altered mental status, Unsafe d/c plan and Inpatient level of care appropriate due to severity of illness   Dispo: The patient is from: Home              Anticipated d/c is to: Home              Anticipated d/c date is: 2 days              Patient currently is not medically stable to d/c.    Consultants:     Procedures:     Antimicrobials: Anti-infectives  (From admission, onward)   None      Subjective: Nauseated this AM  Objective: Vitals:   11/16/20 0435 11/16/20 0734 11/16/20 0734 11/16/20 1113  BP: (!) 183/71 (!) 154/69 (!) 154/69 (!) 154/76  Pulse: 87 72 70 71  Resp: 20 18 18 18   Temp: 97.7 F (36.5 C) 98.2 F (36.8 C) 98.2 F (36.8 C) 98.2 F (36.8 C)  TempSrc: Oral     SpO2:  92% 93% 95%  Weight:      Height:        Intake/Output Summary (Last 24 hours) at 11/16/2020 1440 Last data filed at 11/16/2020 11/18/2020 Gross per 24 hour  Intake 3005.83 ml  Output --  Net 3005.83 ml   Filed Weights   11/14/20 1808  Weight: 90.7 kg    Examination: General exam: Awake, laying in bed, appears uncomfrotable Respiratory system: Normal respiratory effort, no wheezing Cardiovascular system: regular rate, s1, s2 Gastrointestinal system: Soft, nondistended, positive BS Central nervous system: CN2-12 grossly intact, strength intact Extremities: Perfused, no clubbing Skin: Normal skin turgor, no notable skin lesions seen Psychiatry: Mood normal // no visual hallucinations   Data Reviewed: I have personally reviewed following labs and imaging studies  CBC: Recent Labs  Lab 11/14/20 1814 11/15/20 0527 11/16/20 0541  WBC 8.5 6.1 5.7  NEUTROABS 5.4  --   --   HGB 13.5 13.3 12.8  HCT 41.8 41.0 39.4  MCV 99.5 98.6 98.5  PLT  244 203 206   Basic Metabolic Panel: Recent Labs  Lab 11/14/20 1814 11/15/20 0527 11/16/20 0541  NA 137 141 141  K 4.6 4.1 3.8  CL 100 106 106  CO2 27 28 26   GLUCOSE 215* 259* 120*  BUN 32* 20 13  CREATININE 1.01* 0.77 0.72  CALCIUM 9.5 8.6* 9.1   GFR: Estimated Creatinine Clearance: 68.7 mL/min (by C-G formula based on SCr of 0.72 mg/dL). Liver Function Tests: Recent Labs  Lab 11/14/20 1814 11/16/20 0541  AST 38 26  ALT 14 12  ALKPHOS 80 57  BILITOT 0.7 0.6  PROT 7.1 6.1*  ALBUMIN 4.3 3.5   No results for input(s): LIPASE, AMYLASE in the last 168 hours. No results for input(s):  AMMONIA in the last 168 hours. Coagulation Profile: No results for input(s): INR, PROTIME in the last 168 hours. Cardiac Enzymes: No results for input(s): CKTOTAL, CKMB, CKMBINDEX, TROPONINI in the last 168 hours. BNP (last 3 results) No results for input(s): PROBNP in the last 8760 hours. HbA1C: No results for input(s): HGBA1C in the last 72 hours. CBG: Recent Labs  Lab 11/15/20 0009 11/15/20 0845 11/16/20 0945 11/16/20 1115  GLUCAP 219* 219* 121* 140*   Lipid Profile: No results for input(s): CHOL, HDL, LDLCALC, TRIG, CHOLHDL, LDLDIRECT in the last 72 hours. Thyroid Function Tests: No results for input(s): TSH, T4TOTAL, FREET4, T3FREE, THYROIDAB in the last 72 hours. Anemia Panel: No results for input(s): VITAMINB12, FOLATE, FERRITIN, TIBC, IRON, RETICCTPCT in the last 72 hours. Sepsis Labs: No results for input(s): PROCALCITON, LATICACIDVEN in the last 168 hours.  Recent Results (from the past 240 hour(s))  Resp Panel by RT-PCR (Flu A&B, Covid) Nasopharyngeal Swab     Status: None   Collection Time: 11/14/20  9:41 PM   Specimen: Nasopharyngeal Swab; Nasopharyngeal(NP) swabs in vial transport medium  Result Value Ref Range Status   SARS Coronavirus 2 by RT PCR NEGATIVE NEGATIVE Final    Comment: (NOTE) SARS-CoV-2 target nucleic acids are NOT DETECTED.  The SARS-CoV-2 RNA is generally detectable in upper respiratory specimens during the acute phase of infection. The lowest concentration of SARS-CoV-2 viral copies this assay can detect is 138 copies/mL. A negative result does not preclude SARS-Cov-2 infection and should not be used as the sole basis for treatment or other patient management decisions. A negative result may occur with  improper specimen collection/handling, submission of specimen other than nasopharyngeal swab, presence of viral mutation(s) within the areas targeted by this assay, and inadequate number of viral copies(<138 copies/mL). A negative result  must be combined with clinical observations, patient history, and epidemiological information. The expected result is Negative.  Fact Sheet for Patients:  11/16/20  Fact Sheet for Healthcare Providers:  BloggerCourse.com  This test is no t yet approved or cleared by the [place] FDA and  has been authorized for detection and/or diagnosis of SARS-CoV-2 by FDA under an Emergency Use Authorization (EUA). This EUA will remain  in effect (meaning this test can be used) for the duration of the COVID-19 declaration under Section 564(b)(1) of the Act, 21 U.S.C.section 360bbb-3(b)(1), unless the authorization is terminated  or revoked sooner.       Influenza A by PCR NEGATIVE NEGATIVE Final   Influenza B by PCR NEGATIVE NEGATIVE Final    Comment: (NOTE) The Xpert Xpress SARS-CoV-2/FLU/RSV plus assay is intended as an aid in the diagnosis of influenza from Nasopharyngeal swab specimens and should not be used as a sole basis for treatment. Nasal  washings and aspirates are unacceptable for Xpert Xpress SARS-CoV-2/FLU/RSV testing.  Fact Sheet for Patients: BloggerCourse.com  Fact Sheet for Healthcare Providers: SeriousBroker.it  This test is not yet approved or cleared by the [place] FDA and has been authorized for detection and/or diagnosis of SARS-CoV-2 by FDA under an Emergency Use Authorization (EUA). This EUA will remain in effect (meaning this test can be used) for the duration of the COVID-19 declaration under Section 564(b)(1) of the Act, 21 U.S.C. section 360bbb-3(b)(1), unless the authorization is terminated or revoked.  Performed at [facility], [address], [place], [place] [address]      Radiology Studies: CT Head Wo Contrast  Result Date: 11/14/2020 CLINICAL DATA:  Mental status change, unknown cause.  Fall. EXAM: CT HEAD WITHOUT  CONTRAST TECHNIQUE: Contiguous axial images were obtained from the base of the skull through the vertex without intravenous contrast. COMPARISON:  CT head without contrast 03/27/2020 FINDINGS: Brain: Mild generalized atrophy and white matter changes are within normal limits for age. No acute infarct, hemorrhage, or mass lesion is present. The ventricles are of normal size. No significant extraaxial fluid collection is present. The brainstem and cerebellum are within normal limits. Vascular: Atherosclerotic calcifications are present in the cavernous internal carotid arteries bilaterally without a hyperdense vessel. Skull: Focal soft tissue swelling is present in the right parietal scalp centered on image 58 of series 2. No underlying fracture is present. No foreign body is present. Sinuses/Orbits: The paranasal sinuses and mastoid air cells are clear. Bilateral lens replacements are noted. Globes and orbits are otherwise unremarkable. IMPRESSION: 1. Focal soft tissue swelling in the right parietal scalp without underlying fracture or foreign body. 2. Normal CT appearance of the brain for age. Electronically Signed   By: Marin Roberts M.D.   On: 11/14/2020 19:46   DG Chest Portable 1 View  Result Date: 11/14/2020 CLINICAL DATA:  74 year old who fell and is somnolent. Hyperglycemia with blood sugar of 239. EXAM: PORTABLE CHEST 1 VIEW COMPARISON:  03/27/2020 and earlier, including CT chest 10/20/2019. FINDINGS: Markedly suboptimal inspiration (near expiratory image) which accounts for crowded bronchovascular markings diffusely and accentuates the heart size. Taking this into account, cardiac silhouette likely upper normal in size. Lungs clear. No visible pleural effusions. IMPRESSION: Markedly suboptimal inspiration. No acute cardiopulmonary disease. Electronically Signed   By: Hulan Saas M.D.   On: 11/14/2020 18:39    Scheduled Meds: . aspirin EC  81 mg Oral Daily  . atorvastatin  20 mg Oral  Daily  . enoxaparin (LOVENOX) injection  40 mg Subcutaneous Q2200  . ferrous sulfate  325 mg Oral Q breakfast  . fesoterodine  8 mg Oral Daily  . insulin aspart  0-15 Units Subcutaneous TID WC  . insulin aspart  0-5 Units Subcutaneous QHS  . insulin glargine  50 Units Subcutaneous Daily  . metoprolol tartrate  25 mg Oral BID  . mirtazapine  15 mg Oral QHS  . pantoprazole  40 mg Oral Daily  . venlafaxine XR  75 mg Oral TID  . vitamin B-12  100 mcg Oral Daily   Continuous Infusions: . sodium chloride 100 mL/hr at 11/16/20 0509     LOS: 1 day   Rickey Landrey, MD [facility] Pager On Amion  If 7PM-7AM, please contact night-coverage 11/16/2020, 2:40 PM

## 2020-11-17 DIAGNOSIS — T50901A Poisoning by unspecified drugs, medicaments and biological substances, accidental (unintentional), initial encounter: Secondary | ICD-10-CM | POA: Diagnosis not present

## 2020-11-17 LAB — GLUCOSE, CAPILLARY
Glucose-Capillary: 147 mg/dL — ABNORMAL HIGH (ref 70–99)
Glucose-Capillary: 147 mg/dL — ABNORMAL HIGH (ref 70–99)
Glucose-Capillary: 203 mg/dL — ABNORMAL HIGH (ref 70–99)

## 2020-11-17 LAB — CBC
HCT: 40.5 % (ref 36.0–46.0)
Hemoglobin: 13.4 g/dL (ref 12.0–15.0)
MCH: 32.8 pg (ref 26.0–34.0)
MCHC: 33.1 g/dL (ref 30.0–36.0)
MCV: 99 fL (ref 80.0–100.0)
Platelets: 190 10*3/uL (ref 150–400)
RBC: 4.09 MIL/uL (ref 3.87–5.11)
RDW: 12.5 % (ref 11.5–15.5)
WBC: 5.6 10*3/uL (ref 4.0–10.5)
nRBC: 0 % (ref 0.0–0.2)

## 2020-11-17 LAB — COMPREHENSIVE METABOLIC PANEL
ALT: 13 U/L (ref 0–44)
AST: 30 U/L (ref 15–41)
Albumin: 3.5 g/dL (ref 3.5–5.0)
Alkaline Phosphatase: 53 U/L (ref 38–126)
Anion gap: 8 (ref 5–15)
BUN: 10 mg/dL (ref 8–23)
CO2: 26 mmol/L (ref 22–32)
Calcium: 8.8 mg/dL — ABNORMAL LOW (ref 8.9–10.3)
Chloride: 106 mmol/L (ref 98–111)
Creatinine, Ser: 0.75 mg/dL (ref 0.44–1.00)
GFR, Estimated: >60 mL/min (ref >60)
Glucose, Bld: 135 mg/dL — ABNORMAL HIGH (ref 70–99)
Potassium: 3.7 mmol/L (ref 3.5–5.1)
Sodium: 140 mmol/L (ref 135–145)
Total Bilirubin: 0.5 mg/dL (ref 0.3–1.2)
Total Protein: 6.1 g/dL — ABNORMAL LOW (ref 6.5–8.1)

## 2020-11-17 LAB — MAGNESIUM: Magnesium: 1.8 mg/dL (ref 1.7–2.4)

## 2020-11-17 MED ORDER — PREGABALIN 75 MG PO CAPS
75.0000 mg | ORAL_CAPSULE | Freq: Every day | ORAL | Status: DC
  Administered 2020-11-17: 75 mg via ORAL
  Filled 2020-11-17: qty 1

## 2020-11-17 MED ORDER — INSULIN GLARGINE 100 UNIT/ML ~~LOC~~ SOLN
10.0000 [IU] | SUBCUTANEOUS | Status: AC
  Administered 2020-11-17: 10 [IU] via SUBCUTANEOUS
  Filled 2020-11-17: qty 0.1

## 2020-11-17 MED ORDER — METOPROLOL TARTRATE 25 MG PO TABS: 25.0000 mg | ORAL_TABLET | Freq: Two times a day (BID) | ORAL | 0 refills | Status: AC

## 2020-11-17 NOTE — Discharge Summary (Signed)
Physician Discharge Summary  Jessica Donovan IRW:[id number] DOB: 06-19-1946 DOA: 11/14/2020  PCP: Garlon Hatchet, MD  Admit date: 11/14/2020 Discharge date: 11/17/2020  Admitted From: Home Disposition:  Home  Recommendations for Outpatient Follow-up:  1. Follow up with PCP in 1-2 weeks 2. Recommend weaning narcotics and anxiolytics as much as pt can tolerate given concerns of polypharmacy  Home Health:PT   Discharge Condition:Improved CODE STATUS:DNR Diet recommendation: Diabetic   Brief/Interim Summary: 74yo with hx DM2, chronic back pain on multiple sedating meds such as xanax, flexeril, trazodone who was brought to the ED by EMS after being found somnolent by neighbor with concerns of polypharmacy. Pt reportedly did show improvement in the ED after receiving narcan  Discharge Diagnoses:  Active Problems:   Fall  1. Acute toxic metabolic encephalopathy like secondary to polypharmacy with subsequent fall. -Chart reviewed. Pt reportedly was found down and somnolent, improved somewhat after receiving narcan in the ED -Mentation seems improved and at baseline -PT rec for HHPT -While in hospital, had decreased xanax to 0.25mg  BID PRN anxiety. Recommend weaning sedating meds on an outpatient basis  2. Dyslipidemia. -Continued with statin therapy.  3. Type 2 diabetes mellitus. -Continued on supplemental coverage with NovoLog and continue basal coverage while in hospital  4. Essential hypertension. -BP currently stable -Pt is continued on metoprolol 25mg  bid  5. GERD. -Continue with protonix  6. Peripheral neuropathy. -Continue with supportive care  7. DVT prophylaxis. -Subcutaneous Lovenox.  8. Nausea -acute in onset and was transient and self-limiting -Was able to tolerate a regular diet   Discharge Instructions   Allergies as of 11/17/2020      Reactions   Amoxicillin Diarrhea   Ampicillin Nausea And Vomiting, Other (See Comments)   Did it  involve swelling of the face/tongue/throat, SOB, or low BP? Yes Did it involve sudden or severe rash/hives, skin peeling, or any reaction on the inside of your mouth or nose? No Did you need to seek medical attention at a hospital or doctor's office? No When did it last happen?>10 years If all above answers are "NO", may proceed with cephalosporin use.   Augmentin [amoxicillin-pot Clavulanate] Diarrhea   Bactrim [sulfamethoxazole-trimethoprim] Other (See Comments)   N/V/D   Codeine Nausea And Vomiting   Eggs Or Egg-derived Products    ALLergic to cook or raw egg only. Food with egg ingredient is okay for patient.    Flu Virus Vaccine Other (See Comments)   Reaction: Passed out for 12 days   Influenza Vaccines Other (See Comments)   Reaction:  Caused pt to pass out    Methadone Hives, Itching   Oxycodone-acetaminophen Nausea And Vomiting   Percocet [oxycodone-acetaminophen] Nausea And Vomiting   Sulfa Antibiotics Other (See Comments)   Reaction: unknown   Tetanus Antitoxin Swelling, Other (See Comments)   Reaction: injection site swelling   Tetanus Toxoids Swelling, Other (See Comments)   Reaction:  Swelling at injection site   Penicillin G Rash   Has patient had a PCN reaction causing immediate rash, facial/tongue/throat swelling, SOB or lightheadedness with hypotension: Yes Has patient had a PCN reaction causing severe rash involving mucus membranes or skin necrosis: No Has patient had a PCN reaction that required hospitalization: No Has patient had a PCN reaction occurring within the last 10 years: No If all of the above answers are "NO", then may proceed with Cephalosporin use..   Tetracyclines & Related Rash      Medication List    TAKE these medications  acetaminophen 500 MG tablet Commonly known as: TYLENOL Take 1,000 mg by mouth every 8 (eight) hours as needed for mild pain or headache.   ALPRAZolam 1 MG tablet Commonly known as: XANAX Take 1 tablet (1 mg total) by  mouth 2 (two) times daily as needed for anxiety.   AMBULATORY NON FORMULARY MEDICATION Compounded estrogen  Apply 1 gram vaginally M,W, Friday [facility]   aspirin 81 MG tablet Take 81 mg by mouth daily.   atorvastatin 20 MG tablet Commonly known as: LIPITOR Take 20 mg by mouth daily.   celecoxib 100 MG capsule Commonly known as: CELEBREX Take 100 mg by mouth 2 (two) times daily.   cyclobenzaprine 10 MG tablet Commonly known as: FLEXERIL Take 10 mg by mouth 3 (three) times daily as needed.   ferrous sulfate 325 (65 FE) MG tablet Take 325 mg by mouth daily with breakfast.   HAIR SKIN AND NAILS FORMULA PO Take by mouth daily. 2 gummies daily   HumaLOG KwikPen 100 UNIT/ML KwikPen Generic drug: insulin lispro Inject 10 Units into the skin 3 (three) times daily. Pt just on sliding scale with meals   insulin aspart 100 UNIT/ML injection Commonly known as: novoLOG Inject 3-15 Units into the skin 3 (three) times daily with meals as needed for high blood sugar. Pt uses as needed per sliding scale:    Less than 140:  0 units  140-180:  3 units 181-220:  4 units 221- 260:  6 units 261- 320:  8 units 321-360:  10 units 361-400:  12 units Greater than 400:  15 units   isosorbide mononitrate 30 MG 24 hr tablet Commonly known as: IMDUR Take 30 mg by mouth daily.   lisinopril 20 MG tablet Commonly known as: ZESTRIL Take 20 mg by mouth daily.   metoprolol tartrate 25 MG tablet Commonly known as: LOPRESSOR Take 1 tablet (25 mg total) by mouth 2 (two) times daily.   mirtazapine 15 MG tablet Commonly known as: REMERON Take 15 mg by mouth at bedtime.   morphine 30 MG 12 hr tablet Commonly known as: MS CONTIN Take 30 mg by mouth 3 (three) times daily. What changed: Another medication with the same name was changed. Make sure you understand how and when to take each.   morphine 15 MG tablet Commonly known as: MSIR Take 1 tablet (15 mg total) by mouth 2 (two)  times daily. What changed:   when to take this  reasons to take this   omeprazole 20 MG capsule Commonly known as: PRILOSEC Take 20 mg by mouth daily.   pantoprazole 40 MG tablet Commonly known as: PROTONIX Take 40 mg by mouth daily.   Toujeo SoloStar 300 UNIT/ML Solostar Pen Generic drug: insulin glargine (1 Unit Dial) Inject 50 Units into the skin daily.   traZODone 100 MG tablet Commonly known as: DESYREL Take 100 mg by mouth at bedtime.   venlafaxine XR 75 MG 24 hr capsule Commonly known as: EFFEXOR-XR Take 75 mg by mouth 3 (three) times daily.   Vitamin D (Ergocalciferol) 1.25 MG (50000 UNIT) Caps capsule Commonly known as: DRISDOL Take 50,000 Units by mouth every Sunday.       Follow-up Information    Garlon HatchetMcConville, Robert, MD. Schedule an appointment as soon as possible for a visit in 1 week(s).   Specialty: Family Medicine       Antonieta IbaGollan, Timothy J, MD .   Specialty: Cardiology Contact information: [address] [place] [id number]  [phone]              Allergies  Allergen Reactions  . Amoxicillin Diarrhea  . Ampicillin Nausea And Vomiting and Other (See Comments)    Did it involve swelling of the face/tongue/throat, SOB, or low BP? Yes Did it involve sudden or severe rash/hives, skin peeling, or any reaction on the inside of your mouth or nose? No Did you need to seek medical attention at a hospital or doctor's office? No When did it last happen?>10 years If all above answers are "NO", may proceed with cephalosporin use.   . Augmentin [Amoxicillin-Pot Clavulanate] Diarrhea  . Bactrim [Sulfamethoxazole-Trimethoprim] Other (See Comments)    N/V/D  . Codeine Nausea And Vomiting  . Eggs Or Egg-Derived Products     ALLergic to cook or raw egg only. Food with egg ingredient is okay for patient.   . Flu Virus Vaccine Other (See Comments)    Reaction: Passed out for 12 days  . Influenza Vaccines Other (See Comments)    Reaction:   Caused pt to pass out   . Methadone Hives and Itching  . Oxycodone-Acetaminophen Nausea And Vomiting  . Percocet [Oxycodone-Acetaminophen] Nausea And Vomiting  . Sulfa Antibiotics Other (See Comments)    Reaction: unknown  . Tetanus Antitoxin Swelling and Other (See Comments)    Reaction: injection site swelling  . Tetanus Toxoids Swelling and Other (See Comments)    Reaction:  Swelling at injection site  . Penicillin G Rash    Has patient had a PCN reaction causing immediate rash, facial/tongue/throat swelling, SOB or lightheadedness with hypotension: Yes Has patient had a PCN reaction causing severe rash involving mucus membranes or skin necrosis: No Has patient had a PCN reaction that required hospitalization: No Has patient had a PCN reaction occurring within the last 10 years: No If all of the above answers are "NO", then may proceed with Cephalosporin use..  . Tetracyclines & Related Rash    Procedures/Studies: CT Head Wo Contrast  Result Date: 11/14/2020 CLINICAL DATA:  Mental status change, unknown cause.  Fall. EXAM: CT HEAD WITHOUT CONTRAST TECHNIQUE: Contiguous axial images were obtained from the base of the skull through the vertex without intravenous contrast. COMPARISON:  CT head without contrast 03/27/2020 FINDINGS: Brain: Mild generalized atrophy and white matter changes are within normal limits for age. No acute infarct, hemorrhage, or mass lesion is present. The ventricles are of normal size. No significant extraaxial fluid collection is present. The brainstem and cerebellum are within normal limits. Vascular: Atherosclerotic calcifications are present in the cavernous internal carotid arteries bilaterally without a hyperdense vessel. Skull: Focal soft tissue swelling is present in the right parietal scalp centered on image 58 of series 2. No underlying fracture is present. No foreign body is present. Sinuses/Orbits: The paranasal sinuses and mastoid air cells are clear.  Bilateral lens replacements are noted. Globes and orbits are otherwise unremarkable. IMPRESSION: 1. Focal soft tissue swelling in the right parietal scalp without underlying fracture or foreign body. 2. Normal CT appearance of the brain for age. Electronically Signed   By: Marin Roberts M.D.   On: 11/14/2020 19:46   DG Chest Portable 1 View  Result Date: 11/14/2020 CLINICAL DATA:  74 year old who fell and is somnolent. Hyperglycemia with blood sugar of 239. EXAM: PORTABLE CHEST 1 VIEW COMPARISON:  03/27/2020 and earlier, including CT chest 10/20/2019. FINDINGS: Markedly suboptimal inspiration (near expiratory image) which accounts for crowded bronchovascular markings diffusely and accentuates the heart size. Taking this into  account, cardiac silhouette likely upper normal in size. Lungs clear. No visible pleural effusions. IMPRESSION: Markedly suboptimal inspiration. No acute cardiopulmonary disease. Electronically Signed   By: Hulan Saas M.D.   On: 11/14/2020 18:39     Subjective: Eager to go home  Discharge Exam: Vitals:   11/17/20 0530 11/17/20 0753  BP: (!) 162/68 (!) 197/101  Pulse: 72 87  Resp: 18 20  Temp: 98.2 F (36.8 C) 97.8 F (36.6 C)  SpO2: 92% 92%   Vitals:   11/16/20 2318 11/16/20 2330 11/17/20 0530 11/17/20 0753  BP: (!) 168/80 (!) 164/70 (!) 162/68 (!) 197/101  Pulse: 90 71 72 87  Resp: 18 18 18 20   Temp: 98.4 F (36.9 C) 98.1 F (36.7 C) 98.2 F (36.8 C) 97.8 F (36.6 C)  TempSrc: Oral Oral Oral Oral  SpO2: 96% 90% 92% 92%  Weight:      Height:        General: Pt is alert, awake, not in acute distress Cardiovascular: RRR, S1/S2 +, no rubs, no gallops Respiratory: CTA bilaterally, no wheezing, no rhonchi Abdominal: Soft, NT, ND, bowel sounds + Extremities: no edema, no cyanosis   The results of significant diagnostics from this hospitalization (including imaging, microbiology, ancillary and laboratory) are listed below for reference.      Microbiology: Recent Results (from the past 240 hour(s))  Resp Panel by RT-PCR (Flu A&B, Covid) Nasopharyngeal Swab     Status: None   Collection Time: 11/14/20  9:41 PM   Specimen: Nasopharyngeal Swab; Nasopharyngeal(NP) swabs in vial transport medium  Result Value Ref Range Status   SARS Coronavirus 2 by RT PCR NEGATIVE NEGATIVE Final    Comment: (NOTE) SARS-CoV-2 target nucleic acids are NOT DETECTED.  The SARS-CoV-2 RNA is generally detectable in upper respiratory specimens during the acute phase of infection. The lowest concentration of SARS-CoV-2 viral copies this assay can detect is 138 copies/mL. A negative result does not preclude SARS-Cov-2 infection and should not be used as the sole basis for treatment or other patient management decisions. A negative result may occur with  improper specimen collection/handling, submission of specimen other than nasopharyngeal swab, presence of viral mutation(s) within the areas targeted by this assay, and inadequate number of viral copies(<138 copies/mL). A negative result must be combined with clinical observations, patient history, and epidemiological information. The expected result is Negative.  Fact Sheet for Patients:  11/16/20  Fact Sheet for Healthcare Providers:  BloggerCourse.com  This test is no t yet approved or cleared by the [place] FDA and  has been authorized for detection and/or diagnosis of SARS-CoV-2 by FDA under an Emergency Use Authorization (EUA). This EUA will remain  in effect (meaning this test can be used) for the duration of the COVID-19 declaration under Section 564(b)(1) of the Act, 21 U.S.C.section 360bbb-3(b)(1), unless the authorization is terminated  or revoked sooner.       Influenza A by PCR NEGATIVE NEGATIVE Final   Influenza B by PCR NEGATIVE NEGATIVE Final    Comment: (NOTE) The Xpert Xpress SARS-CoV-2/FLU/RSV plus assay is  intended as an aid in the diagnosis of influenza from Nasopharyngeal swab specimens and should not be used as a sole basis for treatment. Nasal washings and aspirates are unacceptable for Xpert Xpress SARS-CoV-2/FLU/RSV testing.  Fact Sheet for Patients: [place]  Fact Sheet for Healthcare Providers: BloggerCourse.com  This test is not yet approved or cleared by the [place] FDA and has been authorized for detection and/or diagnosis of SARS-CoV-2  by FDA under an Emergency Use Authorization (EUA). This EUA will remain in effect (meaning this test can be used) for the duration of the COVID-19 declaration under Section 564(b)(1) of the Act, 21 U.S.C. section 360bbb-3(b)(1), unless the authorization is terminated or revoked.  Performed at [facility], [address] [place], [place] [address]      Labs: BNP (last 3 results) Recent Labs    02/09/20 0749 02/25/20 1237  BNP 189.0* 198.0*   Basic Metabolic Panel: Recent Labs  Lab 11/14/20 1814 11/15/20 0527 11/16/20 0541 11/17/20 0443  NA 137 141 141 140  K 4.6 4.1 3.8 3.7  CL 100 106 106 106  CO2 GLUCOSE 215* 259* 120* 135*  BUN 32* CREATININE 1.01* 0.77 0.72 0.75  CALCIUM 9.5 8.6* 9.1 8.8*  MG  --   --   --  1.8   Liver Function Tests: Recent Labs  Lab 11/14/20 1814 11/16/20 0541 11/17/20 0443  AST 38 26 30  ALT ALKPHOS 80 57 53  BILITOT 0.7 0.6 0.5  PROT 7.1 6.1* 6.1*  ALBUMIN 4.3 3.5 3.5   No results for input(s): LIPASE, AMYLASE in the last 168 hours. No results for input(s): AMMONIA in the last 168 hours. CBC: Recent Labs  Lab 11/14/20 1814 11/15/20 0527 11/16/20 0541 11/17/20 0443  WBC 8.5 6.1 5.7 5.6  NEUTROABS 5.4  --   --   --   HGB 13.5 13.3 12.8 13.4  HCT 41.8 41.0 39.4 40.5  MCV 99.5 98.6 98.5 99.0  PLT 244 203 206 190   Cardiac Enzymes: No results for input(s): CKTOTAL, CKMB,  CKMBINDEX, TROPONINI in the last 168 hours. BNP: Invalid input(s): POCBNP CBG: Recent Labs  Lab 11/16/20 0945 11/16/20 1115 11/16/20 1520 11/16/20 2109 11/17/20 0809  GLUCAP 121* 140* 126* 251* 147*   D-Dimer No results for input(s): DDIMER in the last 72 hours. Hgb A1c Recent Labs    11/16/20 0541  HGBA1C 10.5*   Lipid Profile No results for input(s): CHOL, HDL, LDLCALC, TRIG, CHOLHDL, LDLDIRECT in the last 72 hours. Thyroid function studies No results for input(s): TSH, T4TOTAL, T3FREE, THYROIDAB in the last 72 hours.  Invalid input(s): FREET3 Anemia work up No results for input(s): VITAMINB12, FOLATE, FERRITIN, TIBC, IRON, RETICCTPCT in the last 72 hours. Urinalysis    Component Value Date/Time   COLORURINE YELLOW (A) 11/14/2020 1814   APPEARANCEUR CLEAR (A) 11/14/2020 1814   APPEARANCEUR Cloudy (A) 06/03/2020 1559   LABSPEC 1.016 11/14/2020 1814   LABSPEC 1.014 12/15/2014 0958   PHURINE 5.0 11/14/2020 1814   GLUCOSEU >=500 (A) 11/14/2020 1814   GLUCOSEU Negative 12/15/2014 0958   HGBUR NEGATIVE 11/14/2020 1814   BILIRUBINUR NEGATIVE 11/14/2020 1814   BILIRUBINUR Negative 06/03/2020 1559   BILIRUBINUR Negative 12/15/2014 0958   KETONESUR NEGATIVE 11/14/2020 1814   PROTEINUR NEGATIVE 11/14/2020 1814   NITRITE NEGATIVE 11/14/2020 1814   LEUKOCYTESUR NEGATIVE 11/14/2020 1814   LEUKOCYTESUR 1+ 12/15/2014 0958   Sepsis Labs Invalid input(s): PROCALCITONIN,  WBC,  LACTICIDVEN Microbiology Recent Results (from the past 240 hour(s))  Resp Panel by RT-PCR (Flu A&B, Covid) Nasopharyngeal Swab     Status: None   Collection Time: 11/14/20  9:41 PM   Specimen: Nasopharyngeal Swab; Nasopharyngeal(NP) swabs in vial transport medium  Result Value Ref Range Status   SARS Coronavirus 2 by RT PCR NEGATIVE NEGATIVE Final    Comment: (NOTE) SARS-CoV-2 target nucleic acids are  NOT DETECTED.  The SARS-CoV-2 RNA is generally detectable in upper respiratory specimens during  the acute phase of infection. The lowest concentration of SARS-CoV-2 viral copies this assay can detect is 138 copies/mL. A negative result does not preclude SARS-Cov-2 infection and should not be used as the sole basis for treatment or other patient management decisions. A negative result may occur with  improper specimen collection/handling, submission of specimen other than nasopharyngeal swab, presence of viral mutation(s) within the areas targeted by this assay, and inadequate number of viral copies(<138 copies/mL). A negative result must be combined with clinical observations, patient history, and epidemiological information. The expected result is Negative.  Fact Sheet for Patients:  BloggerCourse.com  Fact Sheet for Healthcare Providers:  [employer]ious[employer]ker.it  This test is no t yet approved or cleared by the [place] FDA and  has been authorized for detection and/or diagnosis of SARS-CoV-2 by FDA under an Emergency Use Authorization (EUA). This EUA will remain  in effect (meaning this test can be used) for the duration of the COVID-19 declaration under Section 564(b)(1) of the Act, 21 U.S.C.section 360bbb-3(b)(1), unless the authorization is terminated  or revoked sooner.       Influenza A by PCR NEGATIVE NEGATIVE Final   Influenza B by PCR NEGATIVE NEGATIVE Final    Comment: (NOTE) The Xpert Xpress SARS-CoV-2/FLU/RSV plus assay is intended as an aid in the diagnosis of influenza from Nasopharyngeal swab specimens and should not be used as a sole basis for treatment. Nasal washings and aspirates are unacceptable for Xpert Xpress SARS-CoV-2/FLU/RSV testing.  Fact Sheet for Patients: BloggerCourse.com  Fact Sheet for Healthcare Providers: [employer]ious[employer]ker.it  This test is not yet approved or cleared by the [place] FDA and has been authorized for detection and/or  diagnosis of SARS-CoV-2 by FDA under an Emergency Use Authorization (EUA). This EUA will remain in effect (meaning this test can be used) for the duration of the COVID-19 declaration under Section 564(b)(1) of the Act, 21 U.S.C. section 360bbb-3(b)(1), unless the authorization is terminated or revoked.  Performed at [facility], [address] [place], [place] [address]    Time spent: 30 min  SIGNED:   Rickey Izabella, MD  [facility] 11/17/2020, 10:01 AM  If 7PM-7AM, please contact night-coverage

## 2020-11-17 NOTE — TOC Progression Note (Signed)
Transition of Care [facility]) - Progression Note    Patient Details  Name: Onalee Steinbach MRN: [id number] Date of Birth: 05-May-1946  Transition [facility] CM/SW Contact  Delphia Grates, [place] Phone Number: 11/17/2020, 12:37 PM  Clinical Narrative:      CSW obtained home health PT from [place]. CSW contacted Sarah through [facility] [place] and she was able to get her in for [facility] PT. Patient is discharging today. Doctor and nurse were updated.       Expected Discharge Plan and Services           Expected Discharge Date: 11/17/20                                     Social Determinants of Health (SDOH) Interventions    Readmission Risk Interventions No flowsheet data found.

## 2020-12-24 ENCOUNTER — Ambulatory Visit: Payer: Self-pay | Admitting: Urology

## 2021-01-03 ENCOUNTER — Ambulatory Visit: Payer: HMO | Admitting: Podiatry

## 2021-01-28 ENCOUNTER — Other Ambulatory Visit: Payer: Self-pay | Admitting: *Deleted

## 2021-01-28 MED ORDER — AMBULATORY NON FORMULARY MEDICATION
0 refills | Status: DC
Start: 2021-01-28 — End: 2023-07-22

## 2021-01-28 MED ORDER — AMBULATORY NON FORMULARY MEDICATION
0 refills | Status: DC
Start: 2021-01-28 — End: 2021-01-28

## 2021-01-28 NOTE — Addendum Note (Signed)
Addended by: Milas Kocher A on: 01/28/2021 01:46 PM   Modules accepted: Orders

## 2021-02-24 DIAGNOSIS — G894 Chronic pain syndrome: Secondary | ICD-10-CM | POA: Diagnosis not present

## 2021-02-24 DIAGNOSIS — R309 Painful micturition, unspecified: Secondary | ICD-10-CM | POA: Diagnosis not present

## 2021-02-24 DIAGNOSIS — F1120 Opioid dependence, uncomplicated: Secondary | ICD-10-CM | POA: Diagnosis not present

## 2021-03-07 DIAGNOSIS — L89621 Pressure ulcer of left heel, stage 1: Secondary | ICD-10-CM | POA: Diagnosis not present

## 2021-03-07 DIAGNOSIS — Z89422 Acquired absence of other left toe(s): Secondary | ICD-10-CM | POA: Diagnosis not present

## 2021-03-07 DIAGNOSIS — L97522 Non-pressure chronic ulcer of other part of left foot with fat layer exposed: Secondary | ICD-10-CM | POA: Diagnosis not present

## 2021-03-07 DIAGNOSIS — E1142 Type 2 diabetes mellitus with diabetic polyneuropathy: Secondary | ICD-10-CM | POA: Diagnosis not present

## 2021-03-07 DIAGNOSIS — Z89421 Acquired absence of other right toe(s): Secondary | ICD-10-CM | POA: Diagnosis not present

## 2021-03-07 DIAGNOSIS — S90821A Blister (nonthermal), right foot, initial encounter: Secondary | ICD-10-CM | POA: Diagnosis not present

## 2021-03-07 DIAGNOSIS — B351 Tinea unguium: Secondary | ICD-10-CM | POA: Diagnosis not present

## 2021-03-11 DIAGNOSIS — R309 Painful micturition, unspecified: Secondary | ICD-10-CM | POA: Diagnosis not present

## 2021-03-26 DIAGNOSIS — I2510 Atherosclerotic heart disease of native coronary artery without angina pectoris: Secondary | ICD-10-CM | POA: Diagnosis not present

## 2021-03-26 DIAGNOSIS — H35079 Retinal telangiectasis, unspecified eye: Secondary | ICD-10-CM | POA: Diagnosis not present

## 2021-03-26 DIAGNOSIS — L97429 Non-pressure chronic ulcer of left heel and midfoot with unspecified severity: Secondary | ICD-10-CM | POA: Diagnosis not present

## 2021-03-26 DIAGNOSIS — E1142 Type 2 diabetes mellitus with diabetic polyneuropathy: Secondary | ICD-10-CM | POA: Diagnosis not present

## 2021-03-26 DIAGNOSIS — E1139 Type 2 diabetes mellitus with other diabetic ophthalmic complication: Secondary | ICD-10-CM | POA: Diagnosis not present

## 2021-03-26 DIAGNOSIS — E785 Hyperlipidemia, unspecified: Secondary | ICD-10-CM | POA: Diagnosis not present

## 2021-03-26 DIAGNOSIS — E11622 Type 2 diabetes mellitus with other skin ulcer: Secondary | ICD-10-CM | POA: Diagnosis not present

## 2021-03-26 DIAGNOSIS — Z794 Long term (current) use of insulin: Secondary | ICD-10-CM | POA: Diagnosis not present

## 2021-03-26 DIAGNOSIS — E1165 Type 2 diabetes mellitus with hyperglycemia: Secondary | ICD-10-CM | POA: Diagnosis not present

## 2021-03-31 DIAGNOSIS — E1165 Type 2 diabetes mellitus with hyperglycemia: Secondary | ICD-10-CM | POA: Diagnosis not present

## 2021-03-31 DIAGNOSIS — I10 Essential (primary) hypertension: Secondary | ICD-10-CM | POA: Diagnosis not present

## 2021-03-31 DIAGNOSIS — E782 Mixed hyperlipidemia: Secondary | ICD-10-CM | POA: Diagnosis not present

## 2021-03-31 DIAGNOSIS — Z794 Long term (current) use of insulin: Secondary | ICD-10-CM | POA: Diagnosis not present

## 2021-03-31 DIAGNOSIS — E559 Vitamin D deficiency, unspecified: Secondary | ICD-10-CM | POA: Diagnosis not present

## 2021-03-31 DIAGNOSIS — E1140 Type 2 diabetes mellitus with diabetic neuropathy, unspecified: Secondary | ICD-10-CM | POA: Diagnosis not present

## 2021-04-10 DIAGNOSIS — N390 Urinary tract infection, site not specified: Secondary | ICD-10-CM | POA: Diagnosis not present

## 2021-04-14 DIAGNOSIS — E1142 Type 2 diabetes mellitus with diabetic polyneuropathy: Secondary | ICD-10-CM | POA: Diagnosis not present

## 2021-04-14 DIAGNOSIS — B351 Tinea unguium: Secondary | ICD-10-CM | POA: Diagnosis not present

## 2021-05-09 ENCOUNTER — Emergency Department: Payer: Medicare HMO

## 2021-05-09 ENCOUNTER — Other Ambulatory Visit: Payer: Self-pay

## 2021-05-09 ENCOUNTER — Emergency Department
Admission: EM | Admit: 2021-05-09 | Discharge: 2021-05-09 | Disposition: A | Discharge status: Home / Self Care | Payer: Medicare HMO | Attending: Emergency Medicine | Admitting: Emergency Medicine

## 2021-05-09 DIAGNOSIS — Z794 Long term (current) use of insulin: Secondary | ICD-10-CM | POA: Diagnosis not present

## 2021-05-09 DIAGNOSIS — Z79899 Other long term (current) drug therapy: Secondary | ICD-10-CM | POA: Insufficient documentation

## 2021-05-09 DIAGNOSIS — Z7982 Long term (current) use of aspirin: Secondary | ICD-10-CM | POA: Diagnosis not present

## 2021-05-09 DIAGNOSIS — R404 Transient alteration of awareness: Secondary | ICD-10-CM | POA: Diagnosis not present

## 2021-05-09 DIAGNOSIS — I10 Essential (primary) hypertension: Secondary | ICD-10-CM | POA: Diagnosis not present

## 2021-05-09 DIAGNOSIS — R0902 Hypoxemia: Secondary | ICD-10-CM | POA: Diagnosis not present

## 2021-05-09 DIAGNOSIS — E1142 Type 2 diabetes mellitus with diabetic polyneuropathy: Secondary | ICD-10-CM | POA: Diagnosis not present

## 2021-05-09 DIAGNOSIS — I959 Hypotension, unspecified: Secondary | ICD-10-CM | POA: Diagnosis not present

## 2021-05-09 DIAGNOSIS — I2510 Atherosclerotic heart disease of native coronary artery without angina pectoris: Secondary | ICD-10-CM | POA: Insufficient documentation

## 2021-05-09 DIAGNOSIS — T424X1A Poisoning by benzodiazepines, accidental (unintentional), initial encounter: Secondary | ICD-10-CM | POA: Diagnosis not present

## 2021-05-09 DIAGNOSIS — R55 Syncope and collapse: Secondary | ICD-10-CM | POA: Insufficient documentation

## 2021-05-09 DIAGNOSIS — R4182 Altered mental status, unspecified: Secondary | ICD-10-CM | POA: Insufficient documentation

## 2021-05-09 DIAGNOSIS — Z9660 Presence of unspecified orthopedic joint implant: Secondary | ICD-10-CM | POA: Diagnosis not present

## 2021-05-09 DIAGNOSIS — T402X1A Poisoning by other opioids, accidental (unintentional), initial encounter: Secondary | ICD-10-CM | POA: Insufficient documentation

## 2021-05-09 DIAGNOSIS — R0689 Other abnormalities of breathing: Secondary | ICD-10-CM | POA: Diagnosis not present

## 2021-05-09 DIAGNOSIS — T40601A Poisoning by unspecified narcotics, accidental (unintentional), initial encounter: Secondary | ICD-10-CM

## 2021-05-09 LAB — URINE DRUG SCREEN, QUALITATIVE (ARMC ONLY)
Amphetamines, Ur Screen: NOT DETECTED
Barbiturates, Ur Screen: NOT DETECTED
Benzodiazepine, Ur Scrn: POSITIVE — AB
Cannabinoid 50 Ng, Ur ~~LOC~~: NOT DETECTED
Cocaine Metabolite,Ur ~~LOC~~: NOT DETECTED
MDMA (Ecstasy)Ur Screen: NOT DETECTED
Methadone Scn, Ur: NOT DETECTED
Opiate, Ur Screen: POSITIVE — AB
Phencyclidine (PCP) Ur S: NOT DETECTED
Tricyclic, Ur Screen: POSITIVE — AB

## 2021-05-09 LAB — CBC WITH DIFFERENTIAL/PLATELET
Abs Immature Granulocytes: 0.05 10*3/uL (ref 0.00–0.07)
Basophils Absolute: 0.1 10*3/uL (ref 0.0–0.1)
Basophils Relative: 0 %
Eosinophils Absolute: 0.1 10*3/uL (ref 0.0–0.5)
Eosinophils Relative: 0 %
HCT: 34 % — ABNORMAL LOW (ref 36.0–46.0)
Hemoglobin: 11.1 g/dL — ABNORMAL LOW (ref 12.0–15.0)
Immature Granulocytes: 0 %
Lymphocytes Relative: 17 %
Lymphs Abs: 2.1 10*3/uL (ref 0.7–4.0)
MCH: 32.4 pg (ref 26.0–34.0)
MCHC: 32.6 g/dL (ref 30.0–36.0)
MCV: 99.1 fL (ref 80.0–100.0)
Monocytes Absolute: 1 10*3/uL (ref 0.1–1.0)
Monocytes Relative: 8 %
Neutro Abs: 9.3 10*3/uL — ABNORMAL HIGH (ref 1.7–7.7)
Neutrophils Relative %: 75 %
Platelets: 240 10*3/uL (ref 150–400)
RBC: 3.43 MIL/uL — ABNORMAL LOW (ref 3.87–5.11)
RDW: 13.2 % (ref 11.5–15.5)
WBC: 12.5 10*3/uL — ABNORMAL HIGH (ref 4.0–10.5)
nRBC: 0 % (ref 0.0–0.2)

## 2021-05-09 LAB — URINALYSIS, COMPLETE (UACMP) WITH MICROSCOPIC
Bacteria, UA: NONE SEEN
Bilirubin Urine: NEGATIVE
Glucose, UA: 50 mg/dL — AB
Hgb urine dipstick: NEGATIVE
Ketones, ur: NEGATIVE mg/dL
Leukocytes,Ua: NEGATIVE
Nitrite: NEGATIVE
Protein, ur: NEGATIVE mg/dL
Specific Gravity, Urine: 1.026 (ref 1.005–1.030)
pH: 5 (ref 5.0–8.0)

## 2021-05-09 LAB — COMPREHENSIVE METABOLIC PANEL
ALT: 9 U/L (ref 0–44)
AST: 19 U/L (ref 15–41)
Albumin: 3.3 g/dL — ABNORMAL LOW (ref 3.5–5.0)
Alkaline Phosphatase: 60 U/L (ref 38–126)
Anion gap: 8 (ref 5–15)
BUN: 21 mg/dL (ref 8–23)
CO2: 26 mmol/L (ref 22–32)
Calcium: 8.9 mg/dL (ref 8.9–10.3)
Chloride: 103 mmol/L (ref 98–111)
Creatinine, Ser: 0.82 mg/dL (ref 0.44–1.00)
GFR, Estimated: >60 mL/min (ref >60)
Glucose, Bld: 202 mg/dL — ABNORMAL HIGH (ref 70–99)
Potassium: 4 mmol/L (ref 3.5–5.1)
Sodium: 137 mmol/L (ref 135–145)
Total Bilirubin: 0.5 mg/dL (ref 0.3–1.2)
Total Protein: 6 g/dL — ABNORMAL LOW (ref 6.5–8.1)

## 2021-05-09 LAB — TROPONIN I (HIGH SENSITIVITY)
Troponin I (High Sensitivity): 12 ng/L (ref <18)
Troponin I (High Sensitivity): 10 ng/L (ref <18)

## 2021-05-09 LAB — ETHANOL: Alcohol, Ethyl (B): <10 mg/dL (ref <10)

## 2021-05-09 LAB — SALICYLATE LEVEL: Salicylate Lvl: <7 mg/dL — ABNORMAL LOW (ref 7.0–30.0)

## 2021-05-09 LAB — ACETAMINOPHEN LEVEL: Acetaminophen (Tylenol), Serum: <10 ug/mL — ABNORMAL LOW (ref 10–30)

## 2021-05-09 MED ORDER — NALOXONE HCL 2 MG/2ML IJ SOSY
PREFILLED_SYRINGE | INTRAMUSCULAR | Status: AC
  Filled 2021-05-09: qty 2

## 2021-05-09 NOTE — ED Notes (Signed)
Spoke with provider, pt states she feels weak and does not want to go home. Provider informed. Vicente Males, MD states pt is stable and can be discharged a this time.

## 2021-05-09 NOTE — ED Notes (Signed)
Patient transported to CT 

## 2021-05-09 NOTE — ED Notes (Signed)
Spoke with patient care giver Tobi Bastos and notified of discharge. Caregiver states she will organize transport to pick pt up.

## 2021-05-09 NOTE — ED Notes (Signed)
Patient is resting comfortably. Bed alarm activated and audible. Call light in reach.

## 2021-05-09 NOTE — ED Notes (Signed)
Patient is resting comfortably. Call light within reach. Bed alarm activated and audible.

## 2021-05-09 NOTE — ED Notes (Signed)
Spoke with Tobi Bastos, pts caregiver and she will be picking pt up at 1630.

## 2021-05-09 NOTE — ED Notes (Signed)
Caregiver Gaetano Net [phone]

## 2021-05-09 NOTE — ED Triage Notes (Signed)
Pt BIBA for LOC. Pt found by neighbor face down on floor bedside bed for unknown amount of time. Ems reports 3 bottles of pills all over floor around bedroom. Sinus rhythm on monitor. Pt lethargic but responds to sternal rub. BP 91/37 and 89% on room air on arrival. Pt lives alone per Ems.

## 2021-05-13 NOTE — ED Provider Notes (Signed)
[facility] Emergency Department Provider Note   ____________________________________________   Event Date/Time   First MD Initiated Contact with Patient 05/09/21 0901     (approximate)  I have reviewed the triage vital signs and the nursing notes.   HISTORY  Chief Complaint Loss of Consciousness    HPI Jessica Donovan is a 75 y.o. female With the below stated past medical history who presents via EMS after she was found by a neighbor facedown on the floor by her bedside for an unknown amount of time and was unable to be aroused.  EMS reports 3 different bottles of pills over the floor around the bedroom that were open and scattered.  Reported blood pressure on scene was 91/37 and 89% on room air.  Patient is extremely lethargic but does respond to sternal rub.  Further history and review of systems are unable to be obtained due to mental status         Past Medical History:  Diagnosis Date  . Anxiety   . Chronic back pain   . Collagen vascular disease (HCC)   . Coronary artery disease    a. s/p PCI/DES to dRCA & mLCx in 2014 with repeat LHC in 10/2015 showing patent stents  . Diabetes mellitus without complication (HCC)   . Hypertension   . Low back pain   . Myocardial infarction (HCC)   . Osteomyelitis of toe (HCC) 01/23/2016  . Stroke [facility](HCC)     Patient Active Problem List   Diagnosis Date Noted  . Unresponsiveness 03/27/2020  . Acute metabolic encephalopathy 03/27/2020  . Unresponsive 03/27/2020  . Acute hypercapnic respiratory failure (HCC) 03/27/2020  . Change in mental status 02/08/2020  . Pressure injury of skin 10/23/2019  . Fall   . HTN (hypertension) 10/20/2019  . Diabetic ulcer of left heel associated with type 2 diabetes mellitus, limited to breakdown of skin (HCC) 09/11/2019  . Right foot infection 12/19/2018  . Acute encephalopathy 07/06/2018  . Anxiety 06/10/2017  . Chest pain with moderate risk for cardiac etiology  06/09/2017  . UTI (urinary tract infection) 05/19/2017  . CAD (coronary artery disease) 05/19/2017  . Chronic narcotic use 05/19/2017  . Cellulitis in diabetic foot (HCC) 05/19/2017  . Opioid overdose (HCC) 02/23/2017  . Altered mental status 02/23/2017  . Acute colitis 01/19/2017  . Sepsis (HCC) 01/17/2017  . Cellulitis of fourth toe of right foot 04/10/2016  . Somnolence 04/09/2016  . Dementia (HCC) 04/09/2016  . Cellulitis 04/03/2016  . Hematoma of right parietal scalp 02/23/2016  . Multiple falls 02/23/2016  . Physical debility 02/23/2016  . Type II diabetes mellitus, uncontrolled (HCC) 02/23/2016  . Syncope and collapse 02/23/2016  . Polypharmacy 02/23/2016  . Osteomyelitis of toe (HCC) 01/23/2016  . Rhabdomyolysis 12/07/2015  . ARF (acute renal failure) (HCC) 11/29/2015  . Hypotension 11/29/2015  . 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 Angina effort 11/07/2015  . Osteomyelitis (HCC) 11/07/2015  . Anemia due to chronic blood loss 06/14/2015  . Generalized anxiety disorder 06/14/2015  . Lumbago with sciatica 06/14/2015  . 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  . S/P cardiac catheterization 06/04/2015  . 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 Abnormal weight loss 05/18/2014  . Chronic pain syndrome 05/18/2014  . 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 Diabetic polyneuropathy (HCC) 05/18/2014  . Type 2 diabetes mellitus with diabetic neuropathy (HCC) 05/07/2014  .  Hemorrhage of rectum and anus 01/17/2014  . Cellulitis and abscess of foot excluding toe 05/18/2012    Past Surgical History:  Procedure Laterality Date  . AMPUTATION TOE Left 11/10/2015   Procedure: AMPUTATION TOE;  Surgeon: Linus Galas, MD;  Location: [facility] ORS;  Service: Podiatry;  Laterality: Left;  . AMPUTATION TOE Left 01/24/2016   Procedure: AMPUTATION TOE (2nd mpj);   Surgeon: Linus Galas, DPM;  Location: [facility] ORS;  Service: Podiatry;  Laterality: Left;  . AMPUTATION TOE Right 12/21/2018   Procedure: 2ND TOE AMPUTATION WITH DEBRIDEMENT OF SOFT TISSUE;  Surgeon: Recardo Evangelist, DPM;  Location: [facility] ORS;  Service: Podiatry;  Laterality: Right;  . CARDIAC CATHETERIZATION N/A 11/12/2015   Procedure: Left Heart Cath and Coronary Angiography;  Surgeon: Marcina Millard, MD;  Location: [facility] INVASIVE CV LAB;  Service: Cardiovascular;  Laterality: N/A;  . COLONOSCOPY WITH PROPOFOL N/A 07/20/2017   Procedure: COLONOSCOPY WITH PROPOFOL;  Surgeon: Wyline Mood, MD;  Location[facility] ENDOSCOPY;  Service: Endoscopy;  Laterality: N/A;  . JOINT REPLACEMENT    . TOE AMPUTATION      Prior to Admission medications   Medication Sig Start Date End Date Taking? Authorizing Provider  acetaminophen (TYLENOL) 500 MG tablet Take 1,000 mg by mouth every 8 (eight) hours as needed for mild pain or headache.     [provider]  ALPRAZolam Prudy Feeler) 1 MG tablet Take 1 tablet (1 mg total) by mouth 2 (two) times daily as needed for anxiety. 02/09/20   Lynn Ito, MD  AMBULATORY NON FORMULARY MEDICATION Compounded estrogen  Apply 1 gram vaginally M,W, Friday [facility] 01/28/21   Vanna Scotland, MD  aspirin 81 MG tablet Take 81 mg by mouth daily.    [provider]  atorvastatin (LIPITOR) 20 MG tablet Take 20 mg by mouth daily.     [provider]  celecoxib (CELEBREX) 100 MG capsule Take 100 mg by mouth 2 (two) times daily. 11/06/20   [provider]  cyclobenzaprine (FLEXERIL) 10 MG tablet Take 10 mg by mouth 3 (three) times daily as needed.  02/12/20   [provider]  esomeprazole (NEXIUM) 20 MG capsule Take by mouth.    [provider]  ferrous sulfate 325 (65 FE) MG tablet Take 325 mg by mouth daily with breakfast.    [provider]  HUMALOG KWIKPEN 100 UNIT/ML KwikPen Inject 10 Units into the skin 3 (three) times  daily. Pt just on sliding scale with meals 01/19/20   [provider]  insulin aspart (NOVOLOG) 100 UNIT/ML injection Inject 3-15 Units into the skin 3 (three) times daily with meals as needed for high blood sugar. Pt uses as needed per sliding scale:    Less than 140:  0 units  140-180:  3 units 181-220:  4 units 221- 260:  6 units 261- 320:  8 units 321-360:  10 units 361-400:  12 units Greater than 400:  15 units Patient not taking: Reported on 11/15/2020    [provider]  isosorbide mononitrate (IMDUR) 30 MG 24 hr tablet Take 30 mg by mouth daily. 11/11/20   [provider]  lisinopril (ZESTRIL) 20 MG tablet Take 20 mg by mouth daily. 11/11/20   [provider]  metoprolol tartrate (LOPRESSOR) 25 MG tablet Take 1 tablet (25 mg total) by mouth 2 (two) times daily. 11/17/20 12/17/20  Jerald Kief, MD  mirtazapine (REMERON) 15 MG tablet Take 15 mg by mouth at bedtime.    [provider]  morphine (MS CONTIN) 30 MG 12 hr tablet Take 30 mg by mouth 3 (three) times daily.    [provider]  morphine (MSIR) 15 MG tablet Take 1 tablet (15 mg total) by mouth 2 (two) times daily. Patient taking differently: Take 15 mg by mouth every 4 (four) hours as needed.  02/09/20   Lynn Ito, MD  Multiple Vitamins-Minerals (HAIR SKIN AND NAILS FORMULA PO) Take by mouth daily. 2 gummies daily    [provider]  omeprazole (PRILOSEC) 20 MG capsule Take 20 mg by mouth daily. Patient not taking: Reported on 11/15/2020    [provider]  pantoprazole (PROTONIX) 40 MG tablet Take 40 mg by mouth daily. 11/10/20   [provider]  TOUJEO SOLOSTAR 300 UNIT/ML SOPN Inject 50 Units into the skin daily. 08/08/19   [provider]  traZODone (DESYREL) 100 MG tablet Take 100 mg by mouth at bedtime.    [provider]  venlafaxine XR (EFFEXOR-XR) 75 MG 24 hr capsule Take 75 mg by mouth 3 (three) times daily.     [provider]  Vitamin D, Ergocalciferol, (DRISDOL) 50000 units CAPS capsule Take 50,000 Units by mouth every Sunday.     [provider]    Allergies Amoxicillin, Ampicillin, Augmentin [amoxicillin-pot clavulanate], Bactrim [sulfamethoxazole-trimethoprim], Codeine, Eggs or egg-derived products, Influenza vaccines, Influenza virus vaccine, Methadone, Oxycodone-acetaminophen, Percocet [oxycodone-acetaminophen], Sulfa antibiotics, Tetanus antitoxin, Tetanus toxoids, Penicillin g, and Tetracyclines & related  Family History  Problem Relation Age of Onset  . CAD Mother   . CAD Father     Social History Social History   Tobacco Use  . Smoking status: Never Smoker  . Smokeless tobacco: Never Used  Vaping Use  . Vaping Use: Never used  Substance Use Topics  . Alcohol use: No  . Drug use: No    Review of Systems Unable to assess   ____________________________________________   PHYSICAL EXAM:  VITAL SIGNS: ED Triage Vitals  Enc Vitals Group     BP 05/09/21 0905 112/87     Pulse Rate 05/09/21 0858 86     Resp 05/09/21 0858 13     Temp 05/09/21 0858 97.9 F (36.6 C)     Temp Source 05/09/21 0858 Oral     SpO2 05/09/21 0858 100 %     Weight 05/09/21 0904 260 lb (117.9 kg)     Height 05/09/21 0904 5\' 3"  (1.6 m)     Head Circumference --      Peak Flow --      Pain Score 05/09/21 0902 0     Pain Loc --      Pain Edu? --      Excl. in GC? --    Constitutional: Obese Caucasian elderly female laying in stretcher with eyes closed. Eyes: Conjunctivae are injected. PERRL. Head: Atraumatic. Nose: No congestion/rhinnorhea. Mouth/Throat: Mucous membranes are moist. Neck: No stridor Cardiovascular: Grossly normal heart sounds.  Good peripheral circulation. Respiratory: Normal respiratory effort.  No retractions. Gastrointestinal: Soft and nontender. No distention. Musculoskeletal: No obvious deformities Neurologic: Moves all extremities spontaneously.  GCS  9 Skin:  Skin is warm and dry. No rash noted. Psychiatric: Cooperative.  ____________________________________________   LABS (all labs ordered are listed, but only abnormal results are displayed)  Labs Reviewed  COMPREHENSIVE METABOLIC PANEL - Abnormal; Notable for the following components:      Result Value   Glucose, Bld 202 (*)    Total Protein 6.0 (*)    Albumin 3.3 (*)  All other components within normal limits  CBC WITH DIFFERENTIAL/PLATELET - Abnormal; Notable for the following components:   WBC 12.5 (*)    RBC 3.43 (*)    Hemoglobin 11.1 (*)    HCT 34.0 (*)    Neutro Abs 9.3 (*)    All other components within normal limits  URINALYSIS, COMPLETE (UACMP) WITH MICROSCOPIC - Abnormal; Notable for the following components:   Color, Urine YELLOW (*)    APPearance HAZY (*)    Glucose, UA 50 (*)    All other components within normal limits  URINE DRUG SCREEN, QUALITATIVE ([facility] ONLY) - Abnormal; Notable for the following components:   Tricyclic, Ur Screen POSITIVE (*)    Opiate, Ur Screen POSITIVE (*)    Benzodiazepine, Ur Scrn POSITIVE (*)    All other components within normal limits  ACETAMINOPHEN LEVEL - Abnormal; Notable for the following components:   Acetaminophen (Tylenol), Serum <10 (*)    All other components within normal limits  SALICYLATE LEVEL - Abnormal; Notable for the following components:   Salicylate Lvl <7.0 (*)    All other components within normal limits  ETHANOL  TROPONIN I (HIGH SENSITIVITY)  TROPONIN I (HIGH SENSITIVITY)   ____________________________________________  EKG  ED ECG REPORT I, Merwyn Katos, the attending physician, personally viewed and interpreted this ECG.  Date: 05/13/2021 EKG Time: 0901 Rate: 87 Rhythm: normal sinus rhythm QRS Axis: normal Intervals: normal ST/T Wave abnormalities: normal Narrative Interpretation: no evidence of acute ischemia  ____________________________________________  RADIOLOGY  ED MD  interpretation: CT of the head without contrast shows no evidence of acute abnormalities including no intracerebral hemorrhage, obvious masses, or significant edema  Official radiology report(s): No results found.  ____________________________________________   PROCEDURES  Procedure(s) performed (including Critical Care):  .1-3 Lead EKG Interpretation Performed by: Merwyn Katos, MD Authorized by: Merwyn Katos, MD     Interpretation: normal     ECG rate:  79   ECG rate assessment: normal     Rhythm: sinus rhythm     Ectopy: none     Conduction: normal       ____________________________________________   INITIAL IMPRESSION / ASSESSMENT AND PLAN / ED COURSE  As part of my medical decision making, I reviewed the following data within the electronic MEDICAL RECORD NUMBER Nursing notes reviewed and incorporated, Labs reviewed, EKG interpreted, Old chart reviewed, Radiograph reviewed and Notes from prior ED visits reviewed and incorporated        Patient presents after a presumed opiate overdose with on scene description by EMS of: + Altered mental status + Pinpoint pupils + Slurred, sluggish behavior. + Hypotension  Presentation most consistent with opioid ingestion. ED Interventions: 2 mg Narcan Given History, Exam and Response to Interventions I have a low suspicion for Posterior Circulation Stroke, EtOH Intoxication, Intracranial bleed, Sepsis, Hypothyroidism, Environmental Hypothermia. Reassessment: 3 hours into ED course.  The patient has been reexamined and is ready to be discharged.  All diagnostic results have been reviewed and discussed with the patient/family.  Care plan has been outlined and the patient/family understands all current diagnoses, results, and treatment plans.  There are no new complaints, changes, or physical findings at this time.  All questions have been addressed and answered.  Patient was instructed to, and agrees to follow-up with their primary  care physician as well as return to the emergency department if any new or worsening symptoms develop. Disposition: Discharge.      ____________________________________________   FINAL CLINICAL  IMPRESSION(S) / ED DIAGNOSES  Final diagnoses:  Opiate overdose, accidental or unintentional, initial encounter (HCC)  Benzodiazepine overdose, accidental or unintentional, initial encounter  Altered mental status, unspecified altered mental status type     ED Discharge Orders    None       Note:  This document was prepared using Dragon voice recognition software and may include unintentional dictation errors.   Merwyn Katos, MD 05/13/21 2015

## 2021-05-16 DIAGNOSIS — Z89421 Acquired absence of other right toe(s): Secondary | ICD-10-CM | POA: Diagnosis not present

## 2021-05-16 DIAGNOSIS — Z89422 Acquired absence of other left toe(s): Secondary | ICD-10-CM | POA: Diagnosis not present

## 2021-05-16 DIAGNOSIS — E1142 Type 2 diabetes mellitus with diabetic polyneuropathy: Secondary | ICD-10-CM | POA: Diagnosis not present

## 2021-05-16 DIAGNOSIS — B351 Tinea unguium: Secondary | ICD-10-CM | POA: Diagnosis not present

## 2021-05-16 DIAGNOSIS — S90422A Blister (nonthermal), left great toe, initial encounter: Secondary | ICD-10-CM | POA: Diagnosis not present

## 2021-05-29 DIAGNOSIS — R296 Repeated falls: Secondary | ICD-10-CM | POA: Diagnosis not present

## 2021-05-29 DIAGNOSIS — E1165 Type 2 diabetes mellitus with hyperglycemia: Secondary | ICD-10-CM | POA: Diagnosis not present

## 2021-05-29 DIAGNOSIS — E1142 Type 2 diabetes mellitus with diabetic polyneuropathy: Secondary | ICD-10-CM | POA: Diagnosis not present

## 2021-05-29 DIAGNOSIS — F32A Depression, unspecified: Secondary | ICD-10-CM | POA: Diagnosis not present

## 2021-05-29 DIAGNOSIS — F419 Anxiety disorder, unspecified: Secondary | ICD-10-CM | POA: Diagnosis not present

## 2021-05-29 DIAGNOSIS — Z794 Long term (current) use of insulin: Secondary | ICD-10-CM | POA: Diagnosis not present

## 2021-05-29 DIAGNOSIS — Z79899 Other long term (current) drug therapy: Secondary | ICD-10-CM | POA: Diagnosis not present

## 2021-05-29 DIAGNOSIS — E1139 Type 2 diabetes mellitus with other diabetic ophthalmic complication: Secondary | ICD-10-CM | POA: Diagnosis not present

## 2021-05-29 DIAGNOSIS — G8929 Other chronic pain: Secondary | ICD-10-CM | POA: Diagnosis not present

## 2021-06-02 DIAGNOSIS — E1165 Type 2 diabetes mellitus with hyperglycemia: Secondary | ICD-10-CM | POA: Diagnosis not present

## 2021-06-02 DIAGNOSIS — E1142 Type 2 diabetes mellitus with diabetic polyneuropathy: Secondary | ICD-10-CM | POA: Diagnosis not present

## 2021-06-02 DIAGNOSIS — Z794 Long term (current) use of insulin: Secondary | ICD-10-CM | POA: Diagnosis not present

## 2021-06-02 DIAGNOSIS — I10 Essential (primary) hypertension: Secondary | ICD-10-CM | POA: Diagnosis not present

## 2021-06-02 DIAGNOSIS — Z89422 Acquired absence of other left toe(s): Secondary | ICD-10-CM | POA: Diagnosis not present

## 2021-06-02 DIAGNOSIS — L97522 Non-pressure chronic ulcer of other part of left foot with fat layer exposed: Secondary | ICD-10-CM | POA: Diagnosis not present

## 2021-06-02 DIAGNOSIS — Z89421 Acquired absence of other right toe(s): Secondary | ICD-10-CM | POA: Diagnosis not present

## 2021-06-02 DIAGNOSIS — E782 Mixed hyperlipidemia: Secondary | ICD-10-CM | POA: Diagnosis not present

## 2021-06-25 DIAGNOSIS — E1142 Type 2 diabetes mellitus with diabetic polyneuropathy: Secondary | ICD-10-CM | POA: Diagnosis not present

## 2021-06-25 DIAGNOSIS — L97521 Non-pressure chronic ulcer of other part of left foot limited to breakdown of skin: Secondary | ICD-10-CM | POA: Diagnosis not present

## 2021-06-30 DIAGNOSIS — H35073 Retinal telangiectasis, bilateral: Secondary | ICD-10-CM | POA: Diagnosis not present

## 2021-07-01 DIAGNOSIS — Z79899 Other long term (current) drug therapy: Secondary | ICD-10-CM | POA: Diagnosis not present

## 2021-07-01 DIAGNOSIS — E1142 Type 2 diabetes mellitus with diabetic polyneuropathy: Secondary | ICD-10-CM | POA: Diagnosis not present

## 2021-07-01 DIAGNOSIS — E11621 Type 2 diabetes mellitus with foot ulcer: Secondary | ICD-10-CM | POA: Diagnosis not present

## 2021-07-01 DIAGNOSIS — F419 Anxiety disorder, unspecified: Secondary | ICD-10-CM | POA: Diagnosis not present

## 2021-07-01 DIAGNOSIS — L97429 Non-pressure chronic ulcer of left heel and midfoot with unspecified severity: Secondary | ICD-10-CM | POA: Diagnosis not present

## 2021-07-01 DIAGNOSIS — E1165 Type 2 diabetes mellitus with hyperglycemia: Secondary | ICD-10-CM | POA: Diagnosis not present

## 2021-07-01 DIAGNOSIS — F32A Depression, unspecified: Secondary | ICD-10-CM | POA: Diagnosis not present

## 2021-07-01 DIAGNOSIS — G8929 Other chronic pain: Secondary | ICD-10-CM | POA: Diagnosis not present

## 2021-07-01 DIAGNOSIS — F1190 Opioid use, unspecified, uncomplicated: Secondary | ICD-10-CM | POA: Diagnosis not present

## 2021-07-09 DIAGNOSIS — E1142 Type 2 diabetes mellitus with diabetic polyneuropathy: Secondary | ICD-10-CM | POA: Diagnosis not present

## 2021-07-09 DIAGNOSIS — L97522 Non-pressure chronic ulcer of other part of left foot with fat layer exposed: Secondary | ICD-10-CM | POA: Diagnosis not present

## 2021-07-30 ENCOUNTER — Other Ambulatory Visit: Payer: Self-pay

## 2021-08-01 DIAGNOSIS — S90422D Blister (nonthermal), left great toe, subsequent encounter: Secondary | ICD-10-CM | POA: Diagnosis not present

## 2021-08-01 DIAGNOSIS — L97522 Non-pressure chronic ulcer of other part of left foot with fat layer exposed: Secondary | ICD-10-CM | POA: Diagnosis not present

## 2021-08-12 DIAGNOSIS — L97429 Non-pressure chronic ulcer of left heel and midfoot with unspecified severity: Secondary | ICD-10-CM | POA: Diagnosis not present

## 2021-08-12 DIAGNOSIS — E11621 Type 2 diabetes mellitus with foot ulcer: Secondary | ICD-10-CM | POA: Diagnosis not present

## 2021-08-12 DIAGNOSIS — F1120 Opioid dependence, uncomplicated: Secondary | ICD-10-CM | POA: Diagnosis not present

## 2021-08-12 DIAGNOSIS — N390 Urinary tract infection, site not specified: Secondary | ICD-10-CM | POA: Diagnosis not present

## 2021-08-12 DIAGNOSIS — R309 Painful micturition, unspecified: Secondary | ICD-10-CM | POA: Diagnosis not present

## 2021-08-12 DIAGNOSIS — G8929 Other chronic pain: Secondary | ICD-10-CM | POA: Diagnosis not present

## 2021-08-12 DIAGNOSIS — Z794 Long term (current) use of insulin: Secondary | ICD-10-CM | POA: Diagnosis not present

## 2021-08-12 DIAGNOSIS — N343 Urethral syndrome, unspecified: Secondary | ICD-10-CM | POA: Diagnosis not present

## 2021-08-12 DIAGNOSIS — Z79899 Other long term (current) drug therapy: Secondary | ICD-10-CM | POA: Diagnosis not present

## 2021-08-12 DIAGNOSIS — E1165 Type 2 diabetes mellitus with hyperglycemia: Secondary | ICD-10-CM | POA: Diagnosis not present

## 2021-08-12 DIAGNOSIS — F1320 Sedative, hypnotic or anxiolytic dependence, uncomplicated: Secondary | ICD-10-CM | POA: Diagnosis not present

## 2021-08-22 DIAGNOSIS — L97522 Non-pressure chronic ulcer of other part of left foot with fat layer exposed: Secondary | ICD-10-CM | POA: Diagnosis not present

## 2021-08-22 DIAGNOSIS — Z89421 Acquired absence of other right toe(s): Secondary | ICD-10-CM | POA: Diagnosis not present

## 2021-08-22 DIAGNOSIS — Z89422 Acquired absence of other left toe(s): Secondary | ICD-10-CM | POA: Diagnosis not present

## 2021-08-22 DIAGNOSIS — E1142 Type 2 diabetes mellitus with diabetic polyneuropathy: Secondary | ICD-10-CM | POA: Diagnosis not present

## 2021-10-09 DIAGNOSIS — L720 Epidermal cyst: Secondary | ICD-10-CM | POA: Diagnosis not present

## 2021-10-09 DIAGNOSIS — Z419 Encounter for procedure for purposes other than remedying health state, unspecified: Secondary | ICD-10-CM | POA: Diagnosis not present

## 2021-10-16 DIAGNOSIS — K000 Anodontia: Secondary | ICD-10-CM | POA: Diagnosis not present

## 2021-10-21 DIAGNOSIS — E11621 Type 2 diabetes mellitus with foot ulcer: Secondary | ICD-10-CM | POA: Diagnosis not present

## 2021-10-21 DIAGNOSIS — R202 Paresthesia of skin: Secondary | ICD-10-CM | POA: Diagnosis not present

## 2021-10-21 DIAGNOSIS — E1142 Type 2 diabetes mellitus with diabetic polyneuropathy: Secondary | ICD-10-CM | POA: Diagnosis not present

## 2021-10-21 DIAGNOSIS — F32A Depression, unspecified: Secondary | ICD-10-CM | POA: Diagnosis not present

## 2021-10-21 DIAGNOSIS — E785 Hyperlipidemia, unspecified: Secondary | ICD-10-CM | POA: Diagnosis not present

## 2021-10-21 DIAGNOSIS — G894 Chronic pain syndrome: Secondary | ICD-10-CM | POA: Diagnosis not present

## 2021-10-21 DIAGNOSIS — F1320 Sedative, hypnotic or anxiolytic dependence, uncomplicated: Secondary | ICD-10-CM | POA: Diagnosis not present

## 2021-10-21 DIAGNOSIS — F419 Anxiety disorder, unspecified: Secondary | ICD-10-CM | POA: Diagnosis not present

## 2021-10-21 DIAGNOSIS — F1120 Opioid dependence, uncomplicated: Secondary | ICD-10-CM | POA: Diagnosis not present

## 2021-10-21 DIAGNOSIS — N390 Urinary tract infection, site not specified: Secondary | ICD-10-CM | POA: Diagnosis not present

## 2021-10-21 DIAGNOSIS — E1140 Type 2 diabetes mellitus with diabetic neuropathy, unspecified: Secondary | ICD-10-CM | POA: Diagnosis not present

## 2021-10-21 DIAGNOSIS — I10 Essential (primary) hypertension: Secondary | ICD-10-CM | POA: Diagnosis not present

## 2021-10-21 DIAGNOSIS — F411 Generalized anxiety disorder: Secondary | ICD-10-CM | POA: Diagnosis not present

## 2021-10-21 DIAGNOSIS — R309 Painful micturition, unspecified: Secondary | ICD-10-CM | POA: Diagnosis not present

## 2021-10-21 DIAGNOSIS — F3341 Major depressive disorder, recurrent, in partial remission: Secondary | ICD-10-CM | POA: Diagnosis not present

## 2021-11-26 DIAGNOSIS — B351 Tinea unguium: Secondary | ICD-10-CM | POA: Diagnosis not present

## 2021-11-26 DIAGNOSIS — L851 Acquired keratosis [keratoderma] palmaris et plantaris: Secondary | ICD-10-CM | POA: Diagnosis not present

## 2021-11-26 DIAGNOSIS — E1165 Type 2 diabetes mellitus with hyperglycemia: Secondary | ICD-10-CM | POA: Diagnosis not present

## 2021-11-26 DIAGNOSIS — Z03818 Encounter for observation for suspected exposure to other biological agents ruled out: Secondary | ICD-10-CM | POA: Diagnosis not present

## 2021-11-26 DIAGNOSIS — Z794 Long term (current) use of insulin: Secondary | ICD-10-CM | POA: Diagnosis not present

## 2021-11-26 DIAGNOSIS — R059 Cough, unspecified: Secondary | ICD-10-CM | POA: Diagnosis not present

## 2021-11-26 DIAGNOSIS — E1140 Type 2 diabetes mellitus with diabetic neuropathy, unspecified: Secondary | ICD-10-CM | POA: Diagnosis not present

## 2021-11-26 DIAGNOSIS — E1142 Type 2 diabetes mellitus with diabetic polyneuropathy: Secondary | ICD-10-CM | POA: Diagnosis not present

## 2022-01-22 DIAGNOSIS — F3341 Major depressive disorder, recurrent, in partial remission: Secondary | ICD-10-CM | POA: Diagnosis not present

## 2022-01-22 DIAGNOSIS — G894 Chronic pain syndrome: Secondary | ICD-10-CM | POA: Diagnosis not present

## 2022-01-22 DIAGNOSIS — F1320 Sedative, hypnotic or anxiolytic dependence, uncomplicated: Secondary | ICD-10-CM | POA: Diagnosis not present

## 2022-01-22 DIAGNOSIS — E1140 Type 2 diabetes mellitus with diabetic neuropathy, unspecified: Secondary | ICD-10-CM | POA: Diagnosis not present

## 2022-01-22 DIAGNOSIS — F411 Generalized anxiety disorder: Secondary | ICD-10-CM | POA: Diagnosis not present

## 2022-01-22 DIAGNOSIS — F1120 Opioid dependence, uncomplicated: Secondary | ICD-10-CM | POA: Diagnosis not present

## 2022-01-22 DIAGNOSIS — R202 Paresthesia of skin: Secondary | ICD-10-CM | POA: Diagnosis not present

## 2022-01-22 DIAGNOSIS — I10 Essential (primary) hypertension: Secondary | ICD-10-CM | POA: Diagnosis not present

## 2022-01-22 DIAGNOSIS — E785 Hyperlipidemia, unspecified: Secondary | ICD-10-CM | POA: Diagnosis not present

## 2022-02-13 DIAGNOSIS — Z89422 Acquired absence of other left toe(s): Secondary | ICD-10-CM | POA: Diagnosis not present

## 2022-02-13 DIAGNOSIS — M2041 Other hammer toe(s) (acquired), right foot: Secondary | ICD-10-CM | POA: Diagnosis not present

## 2022-02-13 DIAGNOSIS — L97522 Non-pressure chronic ulcer of other part of left foot with fat layer exposed: Secondary | ICD-10-CM | POA: Diagnosis not present

## 2022-02-13 DIAGNOSIS — Z89421 Acquired absence of other right toe(s): Secondary | ICD-10-CM | POA: Diagnosis not present

## 2022-02-13 DIAGNOSIS — L851 Acquired keratosis [keratoderma] palmaris et plantaris: Secondary | ICD-10-CM | POA: Diagnosis not present

## 2022-02-13 DIAGNOSIS — B351 Tinea unguium: Secondary | ICD-10-CM | POA: Diagnosis not present

## 2022-02-13 DIAGNOSIS — G894 Chronic pain syndrome: Secondary | ICD-10-CM | POA: Diagnosis not present

## 2022-02-13 DIAGNOSIS — L97511 Non-pressure chronic ulcer of other part of right foot limited to breakdown of skin: Secondary | ICD-10-CM | POA: Diagnosis not present

## 2022-02-13 DIAGNOSIS — E1142 Type 2 diabetes mellitus with diabetic polyneuropathy: Secondary | ICD-10-CM | POA: Diagnosis not present

## 2022-02-13 DIAGNOSIS — M2042 Other hammer toe(s) (acquired), left foot: Secondary | ICD-10-CM | POA: Diagnosis not present

## 2022-03-04 DIAGNOSIS — B351 Tinea unguium: Secondary | ICD-10-CM | POA: Diagnosis not present

## 2022-03-04 DIAGNOSIS — E1142 Type 2 diabetes mellitus with diabetic polyneuropathy: Secondary | ICD-10-CM | POA: Diagnosis not present

## 2022-03-04 DIAGNOSIS — Z89421 Acquired absence of other right toe(s): Secondary | ICD-10-CM | POA: Diagnosis not present

## 2022-03-04 DIAGNOSIS — L97522 Non-pressure chronic ulcer of other part of left foot with fat layer exposed: Secondary | ICD-10-CM | POA: Diagnosis not present

## 2022-03-04 DIAGNOSIS — Z89422 Acquired absence of other left toe(s): Secondary | ICD-10-CM | POA: Diagnosis not present

## 2022-03-11 DIAGNOSIS — G8929 Other chronic pain: Secondary | ICD-10-CM | POA: Diagnosis not present

## 2022-03-11 DIAGNOSIS — M19011 Primary osteoarthritis, right shoulder: Secondary | ICD-10-CM | POA: Diagnosis not present

## 2022-03-11 DIAGNOSIS — M19012 Primary osteoarthritis, left shoulder: Secondary | ICD-10-CM | POA: Diagnosis not present

## 2022-03-11 DIAGNOSIS — M25512 Pain in left shoulder: Secondary | ICD-10-CM | POA: Diagnosis not present

## 2022-03-11 DIAGNOSIS — M25511 Pain in right shoulder: Secondary | ICD-10-CM | POA: Diagnosis not present

## 2022-04-22 DIAGNOSIS — M25511 Pain in right shoulder: Secondary | ICD-10-CM | POA: Diagnosis not present

## 2022-04-22 DIAGNOSIS — E1142 Type 2 diabetes mellitus with diabetic polyneuropathy: Secondary | ICD-10-CM | POA: Diagnosis not present

## 2022-04-22 DIAGNOSIS — Z89422 Acquired absence of other left toe(s): Secondary | ICD-10-CM | POA: Diagnosis not present

## 2022-04-22 DIAGNOSIS — L97522 Non-pressure chronic ulcer of other part of left foot with fat layer exposed: Secondary | ICD-10-CM | POA: Diagnosis not present

## 2022-04-22 DIAGNOSIS — M25512 Pain in left shoulder: Secondary | ICD-10-CM | POA: Diagnosis not present

## 2022-04-22 DIAGNOSIS — G8929 Other chronic pain: Secondary | ICD-10-CM | POA: Diagnosis not present

## 2022-04-22 DIAGNOSIS — B3789 Other sites of candidiasis: Secondary | ICD-10-CM | POA: Diagnosis not present

## 2022-04-22 DIAGNOSIS — Z89421 Acquired absence of other right toe(s): Secondary | ICD-10-CM | POA: Diagnosis not present

## 2022-04-22 DIAGNOSIS — I25118 Atherosclerotic heart disease of native coronary artery with other forms of angina pectoris: Secondary | ICD-10-CM | POA: Diagnosis not present

## 2022-04-22 DIAGNOSIS — B351 Tinea unguium: Secondary | ICD-10-CM | POA: Diagnosis not present

## 2022-04-23 DIAGNOSIS — E1140 Type 2 diabetes mellitus with diabetic neuropathy, unspecified: Secondary | ICD-10-CM | POA: Diagnosis not present

## 2022-04-23 DIAGNOSIS — E669 Obesity, unspecified: Secondary | ICD-10-CM | POA: Diagnosis not present

## 2022-04-23 DIAGNOSIS — E1169 Type 2 diabetes mellitus with other specified complication: Secondary | ICD-10-CM | POA: Diagnosis not present

## 2022-04-23 DIAGNOSIS — L97522 Non-pressure chronic ulcer of other part of left foot with fat layer exposed: Secondary | ICD-10-CM | POA: Diagnosis not present

## 2022-04-23 DIAGNOSIS — E1165 Type 2 diabetes mellitus with hyperglycemia: Secondary | ICD-10-CM | POA: Diagnosis not present

## 2022-04-23 DIAGNOSIS — E1159 Type 2 diabetes mellitus with other circulatory complications: Secondary | ICD-10-CM | POA: Diagnosis not present

## 2022-04-23 DIAGNOSIS — Z794 Long term (current) use of insulin: Secondary | ICD-10-CM | POA: Diagnosis not present

## 2022-04-23 DIAGNOSIS — E11621 Type 2 diabetes mellitus with foot ulcer: Secondary | ICD-10-CM | POA: Diagnosis not present

## 2022-05-06 DIAGNOSIS — L03032 Cellulitis of left toe: Secondary | ICD-10-CM | POA: Diagnosis not present

## 2022-05-06 DIAGNOSIS — G894 Chronic pain syndrome: Secondary | ICD-10-CM | POA: Diagnosis not present

## 2022-05-06 DIAGNOSIS — M2041 Other hammer toe(s) (acquired), right foot: Secondary | ICD-10-CM | POA: Diagnosis not present

## 2022-05-06 DIAGNOSIS — M86672 Other chronic osteomyelitis, left ankle and foot: Secondary | ICD-10-CM | POA: Diagnosis not present

## 2022-05-06 DIAGNOSIS — Z89421 Acquired absence of other right toe(s): Secondary | ICD-10-CM | POA: Diagnosis not present

## 2022-05-06 DIAGNOSIS — M2042 Other hammer toe(s) (acquired), left foot: Secondary | ICD-10-CM | POA: Diagnosis not present

## 2022-05-06 DIAGNOSIS — Z89422 Acquired absence of other left toe(s): Secondary | ICD-10-CM | POA: Diagnosis not present

## 2022-05-06 DIAGNOSIS — E1142 Type 2 diabetes mellitus with diabetic polyneuropathy: Secondary | ICD-10-CM | POA: Diagnosis not present

## 2022-05-06 DIAGNOSIS — L97522 Non-pressure chronic ulcer of other part of left foot with fat layer exposed: Secondary | ICD-10-CM | POA: Diagnosis not present

## 2022-05-11 DIAGNOSIS — H5203 Hypermetropia, bilateral: Secondary | ICD-10-CM | POA: Diagnosis not present

## 2022-05-20 DIAGNOSIS — Z89422 Acquired absence of other left toe(s): Secondary | ICD-10-CM | POA: Diagnosis not present

## 2022-05-20 DIAGNOSIS — Z89421 Acquired absence of other right toe(s): Secondary | ICD-10-CM | POA: Diagnosis not present

## 2022-05-20 DIAGNOSIS — L97522 Non-pressure chronic ulcer of other part of left foot with fat layer exposed: Secondary | ICD-10-CM | POA: Diagnosis not present

## 2022-05-20 DIAGNOSIS — M86672 Other chronic osteomyelitis, left ankle and foot: Secondary | ICD-10-CM | POA: Diagnosis not present

## 2022-05-20 DIAGNOSIS — B351 Tinea unguium: Secondary | ICD-10-CM | POA: Diagnosis not present

## 2022-05-20 DIAGNOSIS — M2042 Other hammer toe(s) (acquired), left foot: Secondary | ICD-10-CM | POA: Diagnosis not present

## 2022-05-20 DIAGNOSIS — M2041 Other hammer toe(s) (acquired), right foot: Secondary | ICD-10-CM | POA: Diagnosis not present

## 2022-05-20 DIAGNOSIS — G894 Chronic pain syndrome: Secondary | ICD-10-CM | POA: Diagnosis not present

## 2022-05-20 DIAGNOSIS — E1142 Type 2 diabetes mellitus with diabetic polyneuropathy: Secondary | ICD-10-CM | POA: Diagnosis not present

## 2022-06-02 DIAGNOSIS — I700 Atherosclerosis of aorta: Secondary | ICD-10-CM | POA: Diagnosis not present

## 2022-06-02 DIAGNOSIS — E1140 Type 2 diabetes mellitus with diabetic neuropathy, unspecified: Secondary | ICD-10-CM | POA: Diagnosis not present

## 2022-06-02 DIAGNOSIS — G894 Chronic pain syndrome: Secondary | ICD-10-CM | POA: Diagnosis not present

## 2022-06-02 DIAGNOSIS — Z79899 Other long term (current) drug therapy: Secondary | ICD-10-CM | POA: Diagnosis not present

## 2022-06-02 DIAGNOSIS — I10 Essential (primary) hypertension: Secondary | ICD-10-CM | POA: Diagnosis not present

## 2022-06-02 DIAGNOSIS — F1120 Opioid dependence, uncomplicated: Secondary | ICD-10-CM | POA: Diagnosis not present

## 2022-06-02 DIAGNOSIS — E785 Hyperlipidemia, unspecified: Secondary | ICD-10-CM | POA: Diagnosis not present

## 2022-06-02 DIAGNOSIS — I25118 Atherosclerotic heart disease of native coronary artery with other forms of angina pectoris: Secondary | ICD-10-CM | POA: Diagnosis not present

## 2022-06-11 ENCOUNTER — Other Ambulatory Visit: Payer: Self-pay

## 2022-06-11 ENCOUNTER — Inpatient Hospital Stay
Admission: EM | Admit: 2022-06-11 | Discharge: 2022-06-24 | DRG: 617 | Disposition: A | Discharge status: Skilled Nursing Facility | Payer: No Typology Code available for payment source | Attending: Hospitalist | Admitting: Hospitalist

## 2022-06-11 ENCOUNTER — Observation Stay: Payer: No Typology Code available for payment source

## 2022-06-11 ENCOUNTER — Emergency Department: Payer: No Typology Code available for payment source

## 2022-06-11 DIAGNOSIS — I10 Essential (primary) hypertension: Secondary | ICD-10-CM | POA: Diagnosis present

## 2022-06-11 DIAGNOSIS — M79604 Pain in right leg: Secondary | ICD-10-CM | POA: Diagnosis not present

## 2022-06-11 DIAGNOSIS — E785 Hyperlipidemia, unspecified: Secondary | ICD-10-CM | POA: Diagnosis present

## 2022-06-11 DIAGNOSIS — M86172 Other acute osteomyelitis, left ankle and foot: Secondary | ICD-10-CM | POA: Diagnosis not present

## 2022-06-11 DIAGNOSIS — F02A0 Dementia in other diseases classified elsewhere, mild, without behavioral disturbance, psychotic disturbance, mood disturbance, and anxiety: Secondary | ICD-10-CM

## 2022-06-11 DIAGNOSIS — E11621 Type 2 diabetes mellitus with foot ulcer: Secondary | ICD-10-CM | POA: Diagnosis present

## 2022-06-11 DIAGNOSIS — F331 Major depressive disorder, recurrent, moderate: Secondary | ICD-10-CM | POA: Diagnosis not present

## 2022-06-11 DIAGNOSIS — Z880 Allergy status to penicillin: Secondary | ICD-10-CM

## 2022-06-11 DIAGNOSIS — L97522 Non-pressure chronic ulcer of other part of left foot with fat layer exposed: Secondary | ICD-10-CM | POA: Diagnosis not present

## 2022-06-11 DIAGNOSIS — Z751 Person awaiting admission to adequate facility elsewhere: Secondary | ICD-10-CM

## 2022-06-11 DIAGNOSIS — Z7982 Long term (current) use of aspirin: Secondary | ICD-10-CM

## 2022-06-11 DIAGNOSIS — Z887 Allergy status to serum and vaccine status: Secondary | ICD-10-CM

## 2022-06-11 DIAGNOSIS — Z91012 Allergy to eggs: Secondary | ICD-10-CM

## 2022-06-11 DIAGNOSIS — Z881 Allergy status to other antibiotic agents status: Secondary | ICD-10-CM

## 2022-06-11 DIAGNOSIS — Z882 Allergy status to sulfonamides status: Secondary | ICD-10-CM

## 2022-06-11 DIAGNOSIS — M7989 Other specified soft tissue disorders: Secondary | ICD-10-CM | POA: Diagnosis not present

## 2022-06-11 DIAGNOSIS — I2510 Atherosclerotic heart disease of native coronary artery without angina pectoris: Secondary | ICD-10-CM | POA: Diagnosis present

## 2022-06-11 DIAGNOSIS — Z791 Long term (current) use of non-steroidal anti-inflammatories (NSAID): Secondary | ICD-10-CM

## 2022-06-11 DIAGNOSIS — Z885 Allergy status to narcotic agent status: Secondary | ICD-10-CM

## 2022-06-11 DIAGNOSIS — M86272 Subacute osteomyelitis, left ankle and foot: Secondary | ICD-10-CM

## 2022-06-11 DIAGNOSIS — E1140 Type 2 diabetes mellitus with diabetic neuropathy, unspecified: Secondary | ICD-10-CM | POA: Diagnosis not present

## 2022-06-11 DIAGNOSIS — E1169 Type 2 diabetes mellitus with other specified complication: Secondary | ICD-10-CM | POA: Diagnosis not present

## 2022-06-11 DIAGNOSIS — Z89421 Acquired absence of other right toe(s): Secondary | ICD-10-CM

## 2022-06-11 DIAGNOSIS — E1165 Type 2 diabetes mellitus with hyperglycemia: Secondary | ICD-10-CM | POA: Diagnosis present

## 2022-06-11 DIAGNOSIS — F411 Generalized anxiety disorder: Secondary | ICD-10-CM | POA: Diagnosis not present

## 2022-06-11 DIAGNOSIS — I252 Old myocardial infarction: Secondary | ICD-10-CM

## 2022-06-11 DIAGNOSIS — F0394 Unspecified dementia, unspecified severity, with anxiety: Secondary | ICD-10-CM | POA: Diagnosis present

## 2022-06-11 DIAGNOSIS — E1142 Type 2 diabetes mellitus with diabetic polyneuropathy: Secondary | ICD-10-CM | POA: Diagnosis not present

## 2022-06-11 DIAGNOSIS — Z888 Allergy status to other drugs, medicaments and biological substances status: Secondary | ICD-10-CM

## 2022-06-11 DIAGNOSIS — L97529 Non-pressure chronic ulcer of other part of left foot with unspecified severity: Secondary | ICD-10-CM | POA: Diagnosis present

## 2022-06-11 DIAGNOSIS — M79605 Pain in left leg: Secondary | ICD-10-CM | POA: Diagnosis not present

## 2022-06-11 DIAGNOSIS — G4700 Insomnia, unspecified: Secondary | ICD-10-CM | POA: Diagnosis present

## 2022-06-11 DIAGNOSIS — Z9189 Other specified personal risk factors, not elsewhere classified: Secondary | ICD-10-CM

## 2022-06-11 DIAGNOSIS — F0390 Unspecified dementia without behavioral disturbance: Secondary | ICD-10-CM | POA: Diagnosis present

## 2022-06-11 DIAGNOSIS — Z79891 Long term (current) use of opiate analgesic: Secondary | ICD-10-CM

## 2022-06-11 DIAGNOSIS — Z79899 Other long term (current) drug therapy: Secondary | ICD-10-CM

## 2022-06-11 DIAGNOSIS — F0393 Unspecified dementia, unspecified severity, with mood disturbance: Secondary | ICD-10-CM | POA: Diagnosis present

## 2022-06-11 DIAGNOSIS — Z9861 Coronary angioplasty status: Secondary | ICD-10-CM

## 2022-06-11 DIAGNOSIS — M869 Osteomyelitis, unspecified: Principal | ICD-10-CM | POA: Diagnosis present

## 2022-06-11 DIAGNOSIS — Z8249 Family history of ischemic heart disease and other diseases of the circulatory system: Secondary | ICD-10-CM

## 2022-06-11 DIAGNOSIS — Z8673 Personal history of transient ischemic attack (TIA), and cerebral infarction without residual deficits: Secondary | ICD-10-CM

## 2022-06-11 DIAGNOSIS — G894 Chronic pain syndrome: Secondary | ICD-10-CM | POA: Diagnosis present

## 2022-06-11 DIAGNOSIS — Z89422 Acquired absence of other left toe(s): Secondary | ICD-10-CM

## 2022-06-11 DIAGNOSIS — M79675 Pain in left toe(s): Secondary | ICD-10-CM | POA: Diagnosis not present

## 2022-06-11 DIAGNOSIS — Z794 Long term (current) use of insulin: Secondary | ICD-10-CM

## 2022-06-11 DIAGNOSIS — K219 Gastro-esophageal reflux disease without esophagitis: Secondary | ICD-10-CM | POA: Diagnosis present

## 2022-06-11 LAB — CBC WITH DIFFERENTIAL/PLATELET
Abs Immature Granulocytes: 0.03 10*3/uL (ref 0.00–0.07)
Basophils Absolute: 0.1 10*3/uL (ref 0.0–0.1)
Basophils Relative: 1 %
Eosinophils Absolute: 0.2 10*3/uL (ref 0.0–0.5)
Eosinophils Relative: 2 %
HCT: 41.4 % (ref 36.0–46.0)
Hemoglobin: 13.2 g/dL (ref 12.0–15.0)
Immature Granulocytes: 0 %
Lymphocytes Relative: 41 %
Lymphs Abs: 3.5 10*3/uL (ref 0.7–4.0)
MCH: 31.4 pg (ref 26.0–34.0)
MCHC: 31.9 g/dL (ref 30.0–36.0)
MCV: 98.6 fL (ref 80.0–100.0)
Monocytes Absolute: 0.8 10*3/uL (ref 0.1–1.0)
Monocytes Relative: 9 %
Neutro Abs: 4 10*3/uL (ref 1.7–7.7)
Neutrophils Relative %: 47 %
Platelets: 301 10*3/uL (ref 150–400)
RBC: 4.2 MIL/uL (ref 3.87–5.11)
RDW: 12.7 % (ref 11.5–15.5)
WBC: 8.4 10*3/uL (ref 4.0–10.5)
nRBC: 0 % (ref 0.0–0.2)

## 2022-06-11 LAB — LACTIC ACID, PLASMA
Lactic Acid, Venous: 1.6 mmol/L (ref 0.5–1.9)
Lactic Acid, Venous: 1.1 mmol/L (ref 0.5–1.9)

## 2022-06-11 LAB — COMPREHENSIVE METABOLIC PANEL
ALT: 13 U/L (ref 0–44)
AST: 25 U/L (ref 15–41)
Albumin: 4.3 g/dL (ref 3.5–5.0)
Alkaline Phosphatase: 62 U/L (ref 38–126)
Anion gap: 10 (ref 5–15)
BUN: 25 mg/dL — ABNORMAL HIGH (ref 8–23)
CO2: 24 mmol/L (ref 22–32)
Calcium: 9.9 mg/dL (ref 8.9–10.3)
Chloride: 104 mmol/L (ref 98–111)
Creatinine, Ser: 0.75 mg/dL (ref 0.44–1.00)
GFR, Estimated: >60 mL/min (ref >60)
Glucose, Bld: 139 mg/dL — ABNORMAL HIGH (ref 70–99)
Potassium: 4 mmol/L (ref 3.5–5.1)
Sodium: 138 mmol/L (ref 135–145)
Total Bilirubin: 0.4 mg/dL (ref 0.3–1.2)
Total Protein: 7.1 g/dL (ref 6.5–8.1)

## 2022-06-11 LAB — GLUCOSE, CAPILLARY: Glucose-Capillary: 188 mg/dL — ABNORMAL HIGH (ref 70–99)

## 2022-06-11 MED ORDER — INSULIN ASPART 100 UNIT/ML IJ SOLN
0.0000 [IU] | Freq: Every day | INTRAMUSCULAR | Status: DC
  Administered 2022-06-12: 4 [IU] via SUBCUTANEOUS
  Administered 2022-06-13: 3 [IU] via SUBCUTANEOUS
  Administered 2022-06-16 – 2022-06-22 (×4): 2 [IU] via SUBCUTANEOUS
  Filled 2022-06-11 (×7): qty 1

## 2022-06-11 MED ORDER — CYCLOBENZAPRINE HCL 10 MG PO TABS
10.0000 mg | ORAL_TABLET | Freq: Three times a day (TID) | ORAL | Status: DC | PRN
  Administered 2022-06-11 – 2022-06-24 (×27): 10 mg via ORAL
  Filled 2022-06-11 (×27): qty 1

## 2022-06-11 MED ORDER — ONDANSETRON HCL 4 MG PO TABS: 4.0000 mg | ORAL_TABLET | Freq: Four times a day (QID) | ORAL | Status: DC | PRN

## 2022-06-11 MED ORDER — PANTOPRAZOLE SODIUM 40 MG PO TBEC
40.0000 mg | DELAYED_RELEASE_TABLET | Freq: Every day | ORAL | Status: DC
  Administered 2022-06-13 – 2022-06-24 (×12): 40 mg via ORAL
  Filled 2022-06-11 (×12): qty 1

## 2022-06-11 MED ORDER — ISOSORBIDE MONONITRATE ER 30 MG PO TB24
30.0000 mg | ORAL_TABLET | Freq: Every day | ORAL | Status: DC
  Administered 2022-06-13 – 2022-06-24 (×12): 30 mg via ORAL
  Filled 2022-06-11 (×12): qty 1

## 2022-06-11 MED ORDER — VANCOMYCIN HCL 1500 MG/300ML IV SOLN
1500.0000 mg | INTRAVENOUS | Status: DC
  Administered 2022-06-12 – 2022-06-16 (×5): 1500 mg via INTRAVENOUS
  Filled 2022-06-11 (×5): qty 300

## 2022-06-11 MED ORDER — SODIUM CHLORIDE 0.9 % IV SOLN
2.0000 g | Freq: Three times a day (TID) | INTRAVENOUS | Status: DC
  Administered 2022-06-11 – 2022-06-13 (×5): 2 g via INTRAVENOUS
  Filled 2022-06-11: qty 12.5
  Filled 2022-06-11: qty 2
  Filled 2022-06-11 (×4): qty 12.5
  Filled 2022-06-11: qty 2

## 2022-06-11 MED ORDER — ALPRAZOLAM 0.5 MG PO TABS
0.5000 mg | ORAL_TABLET | Freq: Two times a day (BID) | ORAL | Status: DC | PRN
Start: 2022-06-11 — End: 2022-06-24
  Administered 2022-06-12 – 2022-06-23 (×18): 0.5 mg via ORAL
  Filled 2022-06-11 (×19): qty 1

## 2022-06-11 MED ORDER — ONDANSETRON HCL 4 MG/2ML IJ SOLN: 4.0000 mg | Freq: Four times a day (QID) | INTRAMUSCULAR | Status: DC | PRN

## 2022-06-11 MED ORDER — HEPARIN SODIUM (PORCINE) 5000 UNIT/ML IJ SOLN
5000.0000 [IU] | Freq: Three times a day (TID) | INTRAMUSCULAR | Status: AC
  Administered 2022-06-11: 5000 [IU] via SUBCUTANEOUS
  Filled 2022-06-11: qty 1

## 2022-06-11 MED ORDER — VANCOMYCIN HCL 1750 MG/350ML IV SOLN
1750.0000 mg | Freq: Once | INTRAVENOUS | Status: AC
  Administered 2022-06-11: 1750 mg via INTRAVENOUS
  Filled 2022-06-11: qty 350

## 2022-06-11 MED ORDER — MORPHINE SULFATE 15 MG PO TABS
15.0000 mg | ORAL_TABLET | Freq: Two times a day (BID) | ORAL | Status: DC | PRN
  Administered 2022-06-11 – 2022-06-24 (×23): 15 mg via ORAL
  Filled 2022-06-11 (×26): qty 1

## 2022-06-11 MED ORDER — LISINOPRIL 20 MG PO TABS
20.0000 mg | ORAL_TABLET | Freq: Every day | ORAL | Status: DC
  Administered 2022-06-13 – 2022-06-24 (×12): 20 mg via ORAL
  Filled 2022-06-11 (×12): qty 1

## 2022-06-11 MED ORDER — MIRTAZAPINE 15 MG PO TABS
15.0000 mg | ORAL_TABLET | Freq: Every day | ORAL | Status: DC
  Administered 2022-06-11 – 2022-06-23 (×13): 15 mg via ORAL
  Filled 2022-06-11 (×13): qty 1

## 2022-06-11 MED ORDER — ATORVASTATIN CALCIUM 20 MG PO TABS
20.0000 mg | ORAL_TABLET | Freq: Every day | ORAL | Status: DC
  Administered 2022-06-11 – 2022-06-24 (×13): 20 mg via ORAL
  Filled 2022-06-11 (×13): qty 1

## 2022-06-11 MED ORDER — ACETAMINOPHEN 500 MG PO TABS
1000.0000 mg | ORAL_TABLET | Freq: Four times a day (QID) | ORAL | Status: AC | PRN
Start: 2022-06-11 — End: 2022-06-14
  Administered 2022-06-12 – 2022-06-13 (×4): 1000 mg via ORAL
  Filled 2022-06-11 (×4): qty 2

## 2022-06-11 MED ORDER — METOPROLOL TARTRATE 25 MG PO TABS
25.0000 mg | ORAL_TABLET | Freq: Two times a day (BID) | ORAL | Status: DC
  Administered 2022-06-11 – 2022-06-24 (×26): 25 mg via ORAL
  Filled 2022-06-11 (×27): qty 1

## 2022-06-11 MED ORDER — INSULIN ASPART 100 UNIT/ML IJ SOLN
0.0000 [IU] | Freq: Three times a day (TID) | INTRAMUSCULAR | Status: DC
  Administered 2022-06-12: 3 [IU] via SUBCUTANEOUS
  Administered 2022-06-12: 8 [IU] via SUBCUTANEOUS
  Administered 2022-06-13: 3 [IU] via SUBCUTANEOUS
  Administered 2022-06-13 (×2): 11 [IU] via SUBCUTANEOUS
  Administered 2022-06-14 (×3): 3 [IU] via SUBCUTANEOUS
  Administered 2022-06-15 (×3): 5 [IU] via SUBCUTANEOUS
  Administered 2022-06-16: 3 [IU] via SUBCUTANEOUS
  Administered 2022-06-16: 5 [IU] via SUBCUTANEOUS
  Administered 2022-06-16: 3 [IU] via SUBCUTANEOUS
  Administered 2022-06-17: 5 [IU] via SUBCUTANEOUS
  Administered 2022-06-17: 8 [IU] via SUBCUTANEOUS
  Administered 2022-06-17: 2 [IU] via SUBCUTANEOUS
  Administered 2022-06-18: 3 [IU] via SUBCUTANEOUS
  Administered 2022-06-18: 5 [IU] via SUBCUTANEOUS
  Administered 2022-06-18: 3 [IU] via SUBCUTANEOUS
  Administered 2022-06-19: 8 [IU] via SUBCUTANEOUS
  Administered 2022-06-19 – 2022-06-20 (×3): 3 [IU] via SUBCUTANEOUS
  Administered 2022-06-20: 8 [IU] via SUBCUTANEOUS
  Administered 2022-06-20: 3 [IU] via SUBCUTANEOUS
  Administered 2022-06-21 (×3): 5 [IU] via SUBCUTANEOUS
  Administered 2022-06-22: 3 [IU] via SUBCUTANEOUS
  Administered 2022-06-22: 8 [IU] via SUBCUTANEOUS
  Administered 2022-06-23: 2 [IU] via SUBCUTANEOUS
  Administered 2022-06-23: 8 [IU] via SUBCUTANEOUS
  Administered 2022-06-24: 5 [IU] via SUBCUTANEOUS
  Administered 2022-06-24: 2 [IU] via SUBCUTANEOUS
  Filled 2022-06-11 (×35): qty 1

## 2022-06-11 MED ORDER — ACETAMINOPHEN 650 MG RE SUPP
650.0000 mg | Freq: Four times a day (QID) | RECTAL | Status: AC | PRN
Start: 2022-06-11 — End: 2022-06-14

## 2022-06-11 MED ORDER — VENLAFAXINE HCL ER 75 MG PO CP24
75.0000 mg | ORAL_CAPSULE | Freq: Two times a day (BID) | ORAL | Status: DC
  Administered 2022-06-11 – 2022-06-24 (×25): 75 mg via ORAL
  Filled 2022-06-11 (×26): qty 1

## 2022-06-11 MED ORDER — SENNOSIDES-DOCUSATE SODIUM 8.6-50 MG PO TABS
1.0000 | ORAL_TABLET | Freq: Every evening | ORAL | Status: DC | PRN
  Filled 2022-06-11: qty 1

## 2022-06-11 MED ORDER — PREGABALIN 50 MG PO CAPS
100.0000 mg | ORAL_CAPSULE | Freq: Three times a day (TID) | ORAL | Status: DC
  Administered 2022-06-11 – 2022-06-24 (×37): 100 mg via ORAL
  Filled 2022-06-11 (×37): qty 2

## 2022-06-11 MED ORDER — TRAZODONE HCL 100 MG PO TABS
100.0000 mg | ORAL_TABLET | Freq: Every day | ORAL | Status: DC
  Administered 2022-06-11 – 2022-06-23 (×13): 100 mg via ORAL
  Filled 2022-06-11 (×13): qty 1

## 2022-06-11 NOTE — ED Notes (Signed)
Pt sent from PCP for 3 weeks worse wound to L great toe, throbbing pain since 2 weeks, pt has type 2 DM. Has 2 other toe amputations to L foot.

## 2022-06-11 NOTE — Progress Notes (Signed)
Pharmacy Antibiotic Note  Jessica Donovan is a 76 y.o. female admitted on 06/11/2022 with  osteomyelitis .  Pharmacy has been consulted for vancomycin and cefepime dosing.  Xray of left foot showing possibly osteomyelitis of great toe. Renal function stable and consistent with baseline.   Plan: Vancomycin 1750 mg loading dose x 1  Vancomycin 1500 mg every 24 hours  Goal AUC 400-600 Estimated AUC 497.1, Cmax 36, Cmin 11.3, t1/2 13.4  Cefepime 2 grams every 8 hours  Monitor renal function and surgical plans  Height: 5\' 6"  (167.6 cm) Weight: 81.2 kg (179 lb) IBW/kg (Calculated) : 59.3  Temp (24hrs), Avg:98.2 F (36.8 C), Min:98.2 F (36.8 C), Max:98.2 F (36.8 C)  Recent Labs  Lab 06/11/22 1341  WBC 8.4  CREATININE 0.75  LATICACIDVEN 1.6    Estimated Creatinine Clearance: 65.3 mL/min (by C-G formula based on SCr of 0.75 mg/dL).    Allergies  Allergen Reactions   Amoxicillin Diarrhea   Ampicillin Nausea And Vomiting and Other (See Comments)    Did it involve swelling of the face/tongue/throat, SOB, or low BP? Yes Did it involve sudden or severe rash/hives, skin peeling, or any reaction on the inside of your mouth or nose? No Did you need to seek medical attention at a hospital or doctor's office? No When did it last happen? >10 years If all above answers are "NO", may proceed with cephalosporin use.    Augmentin [Amoxicillin-Pot Clavulanate] Diarrhea   Bactrim [Sulfamethoxazole-Trimethoprim] Other (See Comments)    N/V/D   Codeine Nausea And Vomiting   Eggs Or Egg-Derived Products     ALLergic to cook or raw egg only. Food with egg ingredient is okay for patient.    Influenza Vaccines Other (See Comments)    Reaction:  Caused pt to pass out    Influenza Virus Vaccine Other (See Comments)    Reaction: Passed out for 12 days   Methadone Hives and Itching   Oxycodone-Acetaminophen Nausea And Vomiting   Percocet [Oxycodone-Acetaminophen] Nausea And Vomiting    Sulfa Antibiotics Other (See Comments)    Reaction: unknown   Tetanus Antitoxin Swelling and Other (See Comments)    Reaction: injection site swelling   Tetanus Toxoids Swelling and Other (See Comments)    Reaction:  Swelling at injection site   Penicillin G Rash    Has patient had a PCN reaction causing immediate rash, facial/tongue/throat swelling, SOB or lightheadedness with hypotension: Yes Has patient had a PCN reaction causing severe rash involving mucus membranes or skin necrosis: No Has patient had a PCN reaction that required hospitalization: No Has patient had a PCN reaction occurring within the last 10 years: No If all of the above answers are "NO", then may proceed with Cephalosporin use..   Tetracyclines & Related Rash    Antimicrobials this admission: Vancomycin 6/22 >>  Cefepime 6/22 >>   Dose adjustments this admission: N/a  Microbiology results: 6/22 BCx: in process  Thank you for allowing pharmacy to be a part of this patient's care.  7/22, PharmD Pharmacy Resident  06/11/2022 5:08 PM

## 2022-06-11 NOTE — Plan of Care (Signed)
Pt admitted to unit. No signs of distress noted at this time. Pt currently in transit to Ultrasound on leg.   Problem: Education: Goal: Knowledge of General Education information will improve Description: Including pain rating scale, medication(s)/side effects and non-pharmacologic comfort measures Outcome: Progressing   Problem: Health Behavior/Discharge Planning: Goal: Ability to manage health-related needs will improve Outcome: Progressing   Problem: Clinical Measurements: Goal: Ability to maintain clinical measurements within normal limits will improve Outcome: Progressing Goal: Will remain free from infection Outcome: Progressing Goal: Diagnostic test results will improve Outcome: Progressing Goal: Respiratory complications will improve Outcome: Progressing Goal: Cardiovascular complication will be avoided Outcome: Progressing   Problem: Activity: Goal: Risk for activity intolerance will decrease Outcome: Progressing   Problem: Nutrition: Goal: Adequate nutrition will be maintained Outcome: Progressing   Problem: Coping: Goal: Level of anxiety will decrease Outcome: Progressing   Problem: Elimination: Goal: Will not experience complications related to bowel motility Outcome: Progressing Goal: Will not experience complications related to urinary retention Outcome: Progressing   Problem: Pain Managment: Goal: General experience of comfort will improve Outcome: Progressing   Problem: Safety: Goal: Ability to remain free from injury will improve Outcome: Progressing   Problem: Skin Integrity: Goal: Risk for impaired skin integrity will decrease Outcome: Progressing   Problem: Education: Goal: Ability to describe self-care measures that may prevent or decrease complications (Diabetes Survival Skills Education) will improve Outcome: Progressing Goal: Individualized Educational Video(s) Outcome: Progressing   Problem: Coping: Goal: Ability to adjust to  condition or change in health will improve Outcome: Progressing   Problem: Fluid Volume: Goal: Ability to maintain a balanced intake and output will improve Outcome: Progressing   Problem: Health Behavior/Discharge Planning: Goal: Ability to identify and utilize available resources and services will improve Outcome: Progressing Goal: Ability to manage health-related needs will improve Outcome: Progressing   Problem: Metabolic: Goal: Ability to maintain appropriate glucose levels will improve Outcome: Progressing   Problem: Nutritional: Goal: Maintenance of adequate nutrition will improve Outcome: Progressing Goal: Progress toward achieving an optimal weight will improve Outcome: Progressing   Problem: Skin Integrity: Goal: Risk for impaired skin integrity will decrease Outcome: Progressing   Problem: Tissue Perfusion: Goal: Adequacy of tissue perfusion will improve Outcome: Progressing

## 2022-06-11 NOTE — Assessment & Plan Note (Addendum)
-   Continue to monitor closely - I have ordered patient's home Xanax 0.5 mg p.o. twice daily as needed for anxiety - Recommend working with PCP to titrate off many sedating medications that are not curative

## 2022-06-11 NOTE — ED Triage Notes (Signed)
Pt having drainage and foul odor coming from her left great toe and was told by Dr Excell Seltzer to come to the ED 3 weeks ago but has been putting it off.

## 2022-06-11 NOTE — Hospital Course (Addendum)
[name], is a 76 year old female with history of anxiety, depression, hypertension, chronic pain, insulin-dependent diabetes mellitus, GERD, hyperlipidemia, obesity, insomnia, who presents to the emergency department for chief concerns of left toe ulcer.  Initial vitals in the emergency department showed temperature of 98.2, respiration rate of 16, heart rate of 79, blood pressure 129/75, SPO2 of 93% on room air.  Serum sodium is 138, potassium 4.0, chloride of 104, bicarb 24, BUN of 25, serum creatinine 0.75, nonfasting blood glucose 139, GFR greater than 60, WBC 8.4, hemoglobin 13.2, platelets of 301.  Lactic acid was 1.6.    Blood cultures x2 have been ordered.  UA ordered and pending collection.  ED treatment: None

## 2022-06-11 NOTE — Assessment & Plan Note (Addendum)
-   Cefepime and vancomycin per pharmacy ordered - Podiatry has been consulted, Dr. Alberteen Spindle states he will see the patient - Heart healthy/carb modified diet at this time - N.p.o. after midnight

## 2022-06-11 NOTE — Assessment & Plan Note (Signed)
-   Insulin SSI with at bedtime coverage ordered - Resumed home pregabalin 100 mg 3 times daily - Goal inpatient blood glucose level is 140-180

## 2022-06-11 NOTE — Assessment & Plan Note (Signed)
-   Resumed home lisinopril 20 mg daily, metoprolol tartrate 25 mg p.o. twice daily, Imdur 30 mg daily

## 2022-06-11 NOTE — ED Notes (Signed)
Informed RN bed assigned 

## 2022-06-11 NOTE — ED Provider Notes (Signed)
[facility] Provider Note    Event Date/Time   First MD Initiated Contact with Patient 06/11/22 1551     (approximate)   History   Wound Infection   HPI  Jessica Donovan is a 76 y.o. female with a history of poor control diabetes diabetic neuropathy presents to the ER with recommendation of podiatry for admission for surgical management of osteomyelitis of left great toe.  Has been having issues with poor wound healing and recurrent ulceration of the left great toe for several months becoming increasingly painful swollen over the past few weeks.  Not currently on any antibiotics.  Was actually recommended to come to the ER 3 weeks ago in clinic but did not due to concern for insurance coverage issues.     Physical Exam   Triage Vital Signs: ED Triage Vitals  Enc Vitals Group     BP 06/11/22 1337 127/75     Pulse Rate 06/11/22 1337 79     Resp 06/11/22 1337 16     Temp 06/11/22 1337 98.2 F (36.8 C)     Temp Source 06/11/22 1337 Oral     SpO2 06/11/22 1337 93 %     Weight 06/11/22 1338 179 lb (81.2 kg)     Height 06/11/22 1338 5\' 6"  (1.676 m)     Head Circumference --      Peak Flow --      Pain Score 06/11/22 1338 8     Pain Loc --      Pain Edu? --      Excl. in GC? --     Most recent vital signs: Vitals:   06/11/22 1337  BP: 127/75  Pulse: 79  Resp: 16  Temp: 98.2 F (36.8 C)  SpO2: 93%     Constitutional: Alert  Eyes: Conjunctivae are normal.  Head: Atraumatic. Nose: No congestion/rhinnorhea. Mouth/Throat: Mucous membranes are moist.   Neck: Painless ROM.  Cardiovascular:   Good peripheral circulation. Respiratory: Normal respiratory effort.  No retractions.  Gastrointestinal: Soft and nontender.  Musculoskeletal: Left great toe with ulceration of the base.  Some mild warmth.  Sensation decreased.  Status post second and third toe amputation. Neurologic:  MAE spontaneously. No gross focal neurologic deficits are  appreciated.  Skin:  Skin is warm, dry and intact. No rash noted. Psychiatric: Mood and affect are normal. Speech and behavior are normal.    ED Results / Procedures / Treatments   Labs (all labs ordered are listed, but only abnormal results are displayed) Labs Reviewed  COMPREHENSIVE METABOLIC PANEL - Abnormal; Notable for the following components:      Result Value   Glucose, Bld 139 (*)    BUN 25 (*)    All other components within normal limits  CULTURE, BLOOD (ROUTINE X 2)  CULTURE, BLOOD (ROUTINE X 2)  LACTIC ACID, PLASMA  CBC WITH DIFFERENTIAL/PLATELET  LACTIC ACID, PLASMA  URINALYSIS, ROUTINE W REFLEX MICROSCOPIC     EKG     RADIOLOGY Please see ED Course for my review and interpretation.  I personally reviewed all radiographic images ordered to evaluate for the above acute complaints and reviewed radiology reports and findings.  These findings were personally discussed with the patient.  Please see medical record for radiology report.    PROCEDURES:  Critical Care performed: No  Procedures   MEDICATIONS ORDERED IN ED: Medications - No data to display   IMPRESSION / MDM / ASSESSMENT AND PLAN / ED COURSE  I reviewed the triage vital signs and the nursing notes.                              Differential diagnosis includes, but is not limited to, osteo-, cellulitis, fracture, wound, sepsis  This patient presented to the ER for evaluation of osteomyelitis of the great toe.  She is not septic afebrile no white count no lactate.  Poorly controlled diabetic.  Patient has been sent to the hospital for admission for operative management by podiatry.  X-ray on my interpretation shows evidence of probable abscess to myelitis of the great toe.  No dislocation.  She is not currently septic we will hold off on antibiotics.  Will consult hospitalist for admission.      Patient's presentation is most consistent with acute presentation with potential threat to life  or bodily function.   FINAL CLINICAL IMPRESSION(S) / ED DIAGNOSES   Final diagnoses:  Osteomyelitis of great toe of left foot (HCC)     Rx / DC Orders   ED Discharge Orders     None        Note:  This document was prepared using Dragon voice recognition software and may include unintentional dictation errors.    Willy Eddy, MD 06/11/22 1622

## 2022-06-11 NOTE — Consult Note (Signed)
Reason for Consult: Ulceration with osteomyelitis left hallux. Referring Physician: Cox  Jessica Donovan is an 76 y.o. female.  HPI: This is a 76 year old diabetic female with significant neuropathy with nonhealing ulceration on her left great toe with bony changes on x-ray consistent with osteomyelitis to the distal phalanx.  Presented to the emergency department for admission and amputation of the great toe.  Past Medical History:  Diagnosis Date   Anxiety    Chronic back pain    Collagen vascular disease (HCC)    Coronary artery disease    a. s/p PCI/DES to dRCA & mLCx in 2014 with repeat LHC in 10/2015 showing patent stents   Diabetes mellitus without complication (HCC)    Hypertension    Low back pain    Myocardial infarction (HCC)    Osteomyelitis of toe (HCC) 01/23/2016   Stroke [facility])     Past Surgical History:  Procedure Laterality Date   AMPUTATION TOE Left 11/10/2015   Procedure: AMPUTATION TOE;  Surgeon: Linus Galas, MD;  Location: [facility] ORS;  Service: Podiatry;  Laterality: Left;   AMPUTATION TOE Left 01/24/2016   Procedure: AMPUTATION TOE (2nd mpj);  Surgeon: Linus Galas, DPM;  Location: [facility] ORS;  Service: Podiatry;  Laterality: Left;   AMPUTATION TOE Right 12/21/2018   Procedure: 2ND TOE AMPUTATION WITH DEBRIDEMENT OF SOFT TISSUE;  Surgeon: Recardo Evangelist, DPM;  Location: [facility] ORS;  Service: Podiatry;  Laterality: Right;   CARDIAC CATHETERIZATION N/A 11/12/2015   Procedure: Left Heart Cath and Coronary Angiography;  Surgeon: Marcina Millard, MD;  Location: [facility] INVASIVE CV LAB;  Service: Cardiovascular;  Laterality: N/A;   COLONOSCOPY WITH PROPOFOL N/A 07/20/2017   Procedure: COLONOSCOPY WITH PROPOFOL;  Surgeon: Wyline Mood, MD;  Location: [facility] ENDOSCOPY;  Service: Endoscopy;  Laterality: N/A;   JOINT REPLACEMENT     TOE AMPUTATION      Family History  Problem Relation Age of Onset   CAD Mother    CAD Father     Social History:  reports that she has never  smoked. She has never used smokeless tobacco. She reports that she does not drink alcohol and does not use drugs.  Allergies:  Allergies  Allergen Reactions   Amoxicillin Diarrhea   Ampicillin Nausea And Vomiting and Other (See Comments)    Did it involve swelling of the face/tongue/throat, SOB, or low BP? Yes Did it involve sudden or severe rash/hives, skin peeling, or any reaction on the inside of your mouth or nose? No Did you need to seek medical attention at a hospital or doctor's office? No When did it last happen? >10 years If all above answers are "NO", may proceed with cephalosporin use.    Augmentin [Amoxicillin-Pot Clavulanate] Diarrhea   Bactrim [Sulfamethoxazole-Trimethoprim] Other (See Comments)    N/V/D   Codeine Nausea And Vomiting   Eggs Or Egg-Derived Products     ALLergic to cook or raw egg only. Food with egg ingredient is okay for patient.    Influenza Vaccines Other (See Comments)    Reaction:  Caused pt to pass out    Influenza Virus Vaccine Other (See Comments)    Reaction: Passed out for 12 days   Methadone Hives and Itching   Oxycodone-Acetaminophen Nausea And Vomiting   Percocet [Oxycodone-Acetaminophen] Nausea And Vomiting   Sulfa Antibiotics Other (See Comments)    Reaction: unknown   Tetanus Antitoxin Swelling and Other (See Comments)    Reaction: injection site swelling   Tetanus Toxoids Swelling and Other (See  Comments)    Reaction:  Swelling at injection site   Penicillin G Rash    Has patient had a PCN reaction causing immediate rash, facial/tongue/throat swelling, SOB or lightheadedness with hypotension: Yes Has patient had a PCN reaction causing severe rash involving mucus membranes or skin necrosis: No Has patient had a PCN reaction that required hospitalization: No Has patient had a PCN reaction occurring within the last 10 years: No If all of the above answers are "NO", then may proceed with Cephalosporin use..   Tetracyclines & Related  Rash    Medications: Scheduled:  [START ON 06/12/2022] insulin aspart  0-15 Units Subcutaneous TID WC   insulin aspart  0-5 Units Subcutaneous QHS    Results for orders placed or performed during the hospital encounter of 06/11/22 (from the past 48 hour(s))  Lactic acid, plasma     Status: None   Collection Time: 06/11/22  1:41 PM  Result Value Ref Range   Lactic Acid, Venous 1.6 0.5 - 1.9 mmol/L    Comment: Performed at [facility], [address] [place], [place] [address]  Comprehensive metabolic panel     Status: Abnormal   Collection Time: 06/11/22  1:41 PM  Result Value Ref Range   Sodium 138 135 - 145 mmol/L   Potassium 4.0 3.5 - 5.1 mmol/L   Chloride 104 98 - 111 mmol/L   CO2 24 22 - 32 mmol/L   Glucose, Bld 139 (H) 70 - 99 mg/dL    Comment: Glucose reference range applies only to samples taken after fasting for at least 8 hours.   BUN 25 (H) 8 - 23 mg/dL   Creatinine, Ser 2.40 0.44 - 1.00 mg/dL   Calcium 9.9 8.9 - 97.3 mg/dL   Total Protein 7.1 6.5 - 8.1 g/dL   Albumin 4.3 3.5 - 5.0 g/dL   AST 25 15 - 41 U/L   ALT 13 0 - 44 U/L   Alkaline Phosphatase 62 38 - 126 U/L   Total Bilirubin 0.4 0.3 - 1.2 mg/dL   GFR, Estimated >53 >29 mL/min    Comment: (NOTE) Calculated using the CKD-EPI Creatinine Equation (2021)    Anion gap 10 5 - 15    Comment: Performed at [facility], [address] [place], [place] [address]  CBC with Differential     Status: None   Collection Time: 06/11/22  1:41 PM  Result Value Ref Range   WBC 8.4 4.0 - 10.5 K/uL   RBC 4.20 3.87 - 5.11 MIL/uL   Hemoglobin 13.2 12.0 - 15.0 g/dL   HCT 83.4 19.6 - 22.2 %   MCV 98.6 80.0 - 100.0 fL   MCH 31.4 26.0 - 34.0 pg   MCHC 31.9 30.0 - 36.0 g/dL   RDW 97.9 89.2 - 11.9 %   Platelets 301 150 - 400 K/uL   nRBC 0.0 0.0 - 0.2 %   Neutrophils Relative % 47 %   Neutro Abs 4.0 1.7 - 7.7 K/uL   Lymphocytes Relative 41 %   Lymphs Abs 3.5 0.7 - 4.0 K/uL   Monocytes Relative 9 %    Monocytes Absolute 0.8 0.1 - 1.0 K/uL   Eosinophils Relative 2 %   Eosinophils Absolute 0.2 0.0 - 0.5 K/uL   Basophils Relative 1 %   Basophils Absolute 0.1 0.0 - 0.1 K/uL   Immature Granulocytes 0 %   Abs Immature Granulocytes 0.03 0.00 - 0.07 K/uL    Comment: Performed at [facility],  [address] [place], [place] [address]    DG Foot Complete Left  Result Date: 06/11/2022 CLINICAL DATA:  Infection EXAM: LEFT FOOT - COMPLETE 3+ VIEW COMPARISON:  None Available. FINDINGS: Prior second and third toe amputations. There is blunting of the great toe distal phalanx with mild cortical irregularity. Soft tissue swelling of the great toe. There is severe first MTP degenerative change. Mild midfoot degenerative change. Large plantar calcaneal spur with mineralization along the plantar fascia. Large Achilles enthesophyte. Mild tibiotalar osteoarthritis. IMPRESSION: Blunted great toe distal phalangeal tuft with mild cortical regularity, could potentially represent osteomyelitis. Prior second and third toe amputations. Severe first MTP osteoarthritis. Electronically Signed   By: Caprice Renshaw M.D.   On: 06/11/2022 14:32    Review of Systems  Constitutional:  Negative for chills and fever.  HENT:  Negative for sinus pain and sore throat.   Respiratory:  Negative for cough and shortness of breath.   Cardiovascular:  Negative for chest pain and palpitations.  Gastrointestinal:  Negative for nausea and vomiting.  Endocrine: Negative for polydipsia and polyuria.  Genitourinary:  Negative for frequency and urgency.  Musculoskeletal:        Patient relates multiple previous toe amputations on both feet.  Skin:        Patient relates a chronic nonhealing wound on her left great toe.  Neurological:        Relates significant diabetic neuropathy.  Psychiatric/Behavioral:  Negative for confusion. The patient is not nervous/anxious.    Blood pressure 127/75, pulse 79, temperature 98.2 F (36.8  C), temperature source Oral, resp. rate 16, height 5\' 6"  (1.676 m), weight 81.2 kg, SpO2 93 %. Physical Exam Cardiovascular:     Comments: DP pulse is 2/4 on the left.  PT pulse diminished but palpable, 1/4 on the left.  Capillary fill time intact to the left hallux. Musculoskeletal:     Comments: Stiff range of motion of the pedal joints.  Previous amputations of the left second and third toes and right hallux.  Muscle testing deferred.  Skin:    Comments: The skin is warm dry and somewhat atrophic.  Full-thickness ulceration is noted on the plantar aspect of the left hallux.  Minimal erythema and edema.  Neurological:     Comments: Loss of protective threshold with monofilament wire in the forefoot and toes bilateral.      Assessment/Plan: Assessment: 1.  Full-thickness ulceration left hallux with acute osteomyelitis. 2.  Diabetes with associated neuropathy.  Plan: Discussed with the patient amputation of the left great toe which was previously discussed with Dr. and the reason she presented to the emergency department today.  Discussed possible risks and complications of the procedure including but not limited to inability of the amputation site to heal due to her poorly controlled diabetes or continued infection or vascular status.  No guarantees could be given as to the outcome of surgery.  Questions invited and answered.  We will obtain consent for amputation of the left great toe.  N.p.o. after midnight.  Plan at this point is for surgery tomorrow morning around 10:00.  Excell Seltzer 06/11/2022, 5:52 PM

## 2022-06-12 ENCOUNTER — Inpatient Hospital Stay: Payer: No Typology Code available for payment source | Admitting: Anesthesiology

## 2022-06-12 ENCOUNTER — Encounter
Admission: EM | Disposition: A | Discharge status: Skilled Nursing Facility | Payer: No Typology Code available for payment source | Source: Home / Self Care | Attending: Internal Medicine

## 2022-06-12 ENCOUNTER — Encounter: Payer: Self-pay | Admitting: Internal Medicine

## 2022-06-12 ENCOUNTER — Other Ambulatory Visit: Payer: Self-pay

## 2022-06-12 DIAGNOSIS — G894 Chronic pain syndrome: Secondary | ICD-10-CM

## 2022-06-12 DIAGNOSIS — E785 Hyperlipidemia, unspecified: Secondary | ICD-10-CM | POA: Diagnosis not present

## 2022-06-12 DIAGNOSIS — Z89421 Acquired absence of other right toe(s): Secondary | ICD-10-CM | POA: Diagnosis not present

## 2022-06-12 DIAGNOSIS — Z89422 Acquired absence of other left toe(s): Secondary | ICD-10-CM | POA: Diagnosis not present

## 2022-06-12 DIAGNOSIS — Z751 Person awaiting admission to adequate facility elsewhere: Secondary | ICD-10-CM | POA: Diagnosis not present

## 2022-06-12 DIAGNOSIS — Z4781 Encounter for orthopedic aftercare following surgical amputation: Secondary | ICD-10-CM | POA: Diagnosis not present

## 2022-06-12 DIAGNOSIS — Z8249 Family history of ischemic heart disease and other diseases of the circulatory system: Secondary | ICD-10-CM | POA: Diagnosis not present

## 2022-06-12 DIAGNOSIS — M86172 Other acute osteomyelitis, left ankle and foot: Secondary | ICD-10-CM | POA: Diagnosis not present

## 2022-06-12 DIAGNOSIS — Z885 Allergy status to narcotic agent status: Secondary | ICD-10-CM | POA: Diagnosis not present

## 2022-06-12 DIAGNOSIS — R6884 Jaw pain: Secondary | ICD-10-CM | POA: Diagnosis not present

## 2022-06-12 DIAGNOSIS — I10 Essential (primary) hypertension: Secondary | ICD-10-CM | POA: Diagnosis not present

## 2022-06-12 DIAGNOSIS — F0390 Unspecified dementia without behavioral disturbance: Secondary | ICD-10-CM | POA: Diagnosis not present

## 2022-06-12 DIAGNOSIS — F411 Generalized anxiety disorder: Secondary | ICD-10-CM | POA: Diagnosis not present

## 2022-06-12 DIAGNOSIS — Z9189 Other specified personal risk factors, not elsewhere classified: Secondary | ICD-10-CM | POA: Diagnosis not present

## 2022-06-12 DIAGNOSIS — Z89412 Acquired absence of left great toe: Secondary | ICD-10-CM | POA: Diagnosis not present

## 2022-06-12 DIAGNOSIS — Z8673 Personal history of transient ischemic attack (TIA), and cerebral infarction without residual deficits: Secondary | ICD-10-CM | POA: Diagnosis not present

## 2022-06-12 DIAGNOSIS — G4700 Insomnia, unspecified: Secondary | ICD-10-CM | POA: Diagnosis not present

## 2022-06-12 DIAGNOSIS — F419 Anxiety disorder, unspecified: Secondary | ICD-10-CM | POA: Diagnosis not present

## 2022-06-12 DIAGNOSIS — E1140 Type 2 diabetes mellitus with diabetic neuropathy, unspecified: Secondary | ICD-10-CM | POA: Diagnosis not present

## 2022-06-12 DIAGNOSIS — I252 Old myocardial infarction: Secondary | ICD-10-CM | POA: Diagnosis not present

## 2022-06-12 DIAGNOSIS — E1169 Type 2 diabetes mellitus with other specified complication: Secondary | ICD-10-CM | POA: Diagnosis not present

## 2022-06-12 DIAGNOSIS — I2510 Atherosclerotic heart disease of native coronary artery without angina pectoris: Secondary | ICD-10-CM | POA: Diagnosis not present

## 2022-06-12 DIAGNOSIS — F0393 Unspecified dementia, unspecified severity, with mood disturbance: Secondary | ICD-10-CM | POA: Diagnosis not present

## 2022-06-12 DIAGNOSIS — K219 Gastro-esophageal reflux disease without esophagitis: Secondary | ICD-10-CM | POA: Diagnosis not present

## 2022-06-12 DIAGNOSIS — Z9861 Coronary angioplasty status: Secondary | ICD-10-CM | POA: Diagnosis not present

## 2022-06-12 DIAGNOSIS — E1165 Type 2 diabetes mellitus with hyperglycemia: Secondary | ICD-10-CM | POA: Diagnosis not present

## 2022-06-12 DIAGNOSIS — F0394 Unspecified dementia, unspecified severity, with anxiety: Secondary | ICD-10-CM | POA: Diagnosis not present

## 2022-06-12 DIAGNOSIS — F331 Major depressive disorder, recurrent, moderate: Secondary | ICD-10-CM | POA: Diagnosis not present

## 2022-06-12 DIAGNOSIS — L97529 Non-pressure chronic ulcer of other part of left foot with unspecified severity: Secondary | ICD-10-CM | POA: Diagnosis not present

## 2022-06-12 DIAGNOSIS — E11621 Type 2 diabetes mellitus with foot ulcer: Secondary | ICD-10-CM | POA: Diagnosis not present

## 2022-06-12 DIAGNOSIS — F02A0 Dementia in other diseases classified elsewhere, mild, without behavioral disturbance, psychotic disturbance, mood disturbance, and anxiety: Secondary | ICD-10-CM | POA: Diagnosis not present

## 2022-06-12 DIAGNOSIS — L089 Local infection of the skin and subcutaneous tissue, unspecified: Secondary | ICD-10-CM | POA: Diagnosis not present

## 2022-06-12 DIAGNOSIS — M86272 Subacute osteomyelitis, left ankle and foot: Secondary | ICD-10-CM | POA: Diagnosis not present

## 2022-06-12 DIAGNOSIS — M79675 Pain in left toe(s): Secondary | ICD-10-CM | POA: Diagnosis not present

## 2022-06-12 DIAGNOSIS — M6281 Muscle weakness (generalized): Secondary | ICD-10-CM | POA: Diagnosis not present

## 2022-06-12 HISTORY — PX: AMPUTATION TOE: SHX6595

## 2022-06-12 LAB — CBC
HCT: 40.4 % (ref 36.0–46.0)
Hemoglobin: 12.8 g/dL (ref 12.0–15.0)
MCH: 31.4 pg (ref 26.0–34.0)
MCHC: 31.7 g/dL (ref 30.0–36.0)
MCV: 99.3 fL (ref 80.0–100.0)
Platelets: 261 10*3/uL (ref 150–400)
RBC: 4.07 MIL/uL (ref 3.87–5.11)
RDW: 12.9 % (ref 11.5–15.5)
WBC: 6.4 10*3/uL (ref 4.0–10.5)
nRBC: 0 % (ref 0.0–0.2)

## 2022-06-12 LAB — GLUCOSE, CAPILLARY
Glucose-Capillary: 192 mg/dL — ABNORMAL HIGH (ref 70–99)
Glucose-Capillary: 186 mg/dL — ABNORMAL HIGH (ref 70–99)
Glucose-Capillary: 163 mg/dL — ABNORMAL HIGH (ref 70–99)
Glucose-Capillary: 283 mg/dL — ABNORMAL HIGH (ref 70–99)
Glucose-Capillary: 318 mg/dL — ABNORMAL HIGH (ref 70–99)

## 2022-06-12 LAB — BASIC METABOLIC PANEL
Anion gap: 6 (ref 5–15)
BUN: 19 mg/dL (ref 8–23)
CO2: 27 mmol/L (ref 22–32)
Calcium: 9.5 mg/dL (ref 8.9–10.3)
Chloride: 109 mmol/L (ref 98–111)
Creatinine, Ser: 0.74 mg/dL (ref 0.44–1.00)
GFR, Estimated: >60 mL/min (ref >60)
Glucose, Bld: 163 mg/dL — ABNORMAL HIGH (ref 70–99)
Potassium: 4.4 mmol/L (ref 3.5–5.1)
Sodium: 142 mmol/L (ref 135–145)

## 2022-06-12 LAB — HEMOGLOBIN A1C
Hgb A1c MFr Bld: 7.8 % — ABNORMAL HIGH (ref 4.8–5.6)
Mean Plasma Glucose: 177.16 mg/dL

## 2022-06-12 SURGERY — AMPUTATION, TOE
Anesthesia: General | Site: Toe | Laterality: Left

## 2022-06-12 MED ORDER — LIDOCAINE HCL (CARDIAC) PF 100 MG/5ML IV SOSY
PREFILLED_SYRINGE | INTRAVENOUS | Status: DC | PRN
  Administered 2022-06-12: 50 mg via INTRAVENOUS

## 2022-06-12 MED ORDER — DEXAMETHASONE SODIUM PHOSPHATE 10 MG/ML IJ SOLN
INTRAMUSCULAR | Status: DC | PRN
  Administered 2022-06-12: 10 mg via INTRAVENOUS

## 2022-06-12 MED ORDER — ONDANSETRON HCL 4 MG/2ML IJ SOLN
INTRAMUSCULAR | Status: DC | PRN
  Administered 2022-06-12: 4 mg via INTRAVENOUS

## 2022-06-12 MED ORDER — LIDOCAINE HCL (PF) 2 % IJ SOLN
INTRAMUSCULAR | Status: AC
  Filled 2022-06-12: qty 5

## 2022-06-12 MED ORDER — FENTANYL CITRATE (PF) 100 MCG/2ML IJ SOLN: 25.0000 ug | INTRAMUSCULAR | Status: DC | PRN

## 2022-06-12 MED ORDER — BUPIVACAINE HCL 0.5 % IJ SOLN
INTRAMUSCULAR | Status: DC | PRN
  Administered 2022-06-12: 10 mL

## 2022-06-12 MED ORDER — INSULIN GLARGINE-YFGN 100 UNIT/ML ~~LOC~~ SOLN
5.0000 [IU] | Freq: Every day | SUBCUTANEOUS | Status: DC
  Administered 2022-06-12: 5 [IU] via SUBCUTANEOUS
  Filled 2022-06-12: qty 0.05

## 2022-06-12 MED ORDER — BUPIVACAINE HCL (PF) 0.5 % IJ SOLN
INTRAMUSCULAR | Status: AC
  Filled 2022-06-12: qty 30

## 2022-06-12 MED ORDER — PROPOFOL 500 MG/50ML IV EMUL
INTRAVENOUS | Status: DC | PRN
  Administered 2022-06-12: 75 ug/kg/min via INTRAVENOUS

## 2022-06-12 MED ORDER — DEXAMETHASONE SODIUM PHOSPHATE 10 MG/ML IJ SOLN
INTRAMUSCULAR | Status: AC
  Filled 2022-06-12: qty 1

## 2022-06-12 MED ORDER — FENTANYL CITRATE (PF) 100 MCG/2ML IJ SOLN
INTRAMUSCULAR | Status: AC
  Filled 2022-06-12: qty 2

## 2022-06-12 MED ORDER — ONDANSETRON HCL 4 MG/2ML IJ SOLN: 4.0000 mg | Freq: Once | INTRAMUSCULAR | Status: DC | PRN

## 2022-06-12 MED ORDER — PROPOFOL 10 MG/ML IV BOLUS
INTRAVENOUS | Status: DC | PRN
  Administered 2022-06-12: 25 mg via INTRAVENOUS

## 2022-06-12 MED ORDER — SODIUM CHLORIDE 0.9 % IV SOLN: INTRAVENOUS | Status: DC | PRN

## 2022-06-12 MED ORDER — ONDANSETRON HCL 4 MG/2ML IJ SOLN
INTRAMUSCULAR | Status: AC
  Filled 2022-06-12: qty 2

## 2022-06-12 MED ORDER — PROPOFOL 1000 MG/100ML IV EMUL
INTRAVENOUS | Status: AC
  Filled 2022-06-12: qty 100

## 2022-06-12 SURGICAL SUPPLY — 54 items
BLADE MED AGGRESSIVE (BLADE) ×2 IMPLANT
BLADE OSC/SAGITTAL MD 5.5X18 (BLADE) ×2 IMPLANT
BLADE SURG 15 STRL LF DISP TIS (BLADE) ×2 IMPLANT
BLADE SURG 15 STRL SS (BLADE) ×4
BLADE SURG MINI STRL (BLADE) ×2 IMPLANT
BNDG CMPR 75X21 PLY HI ABS (MISCELLANEOUS) ×1
BNDG CMPR 82X61 PLY HI ABS (GAUZE/BANDAGES/DRESSINGS) ×1
BNDG CONFORM 3 STRL LF (GAUZE/BANDAGES/DRESSINGS) ×2 IMPLANT
BNDG CONFORM 6X.82 1P STRL (GAUZE/BANDAGES/DRESSINGS) ×1 IMPLANT
BNDG ELASTIC 3X5.8 VLCR STR LF (GAUZE/BANDAGES/DRESSINGS) ×1 IMPLANT
BNDG ELASTIC 4X5.8 VLCR STR LF (GAUZE/BANDAGES/DRESSINGS) ×2 IMPLANT
BNDG ESMARK 4X12 TAN STRL LF (GAUZE/BANDAGES/DRESSINGS) ×2 IMPLANT
BNDG GAUZE DERMACEA FLUFF (GAUZE/BANDAGES/DRESSINGS) ×1
BNDG GAUZE DERMACEA FLUFF 4 (GAUZE/BANDAGES/DRESSINGS) ×1 IMPLANT
BNDG GZE DERMACEA 4 6PLY (GAUZE/BANDAGES/DRESSINGS) ×1
CUFF TOURN SGL QUICK 12 (TOURNIQUET CUFF) ×2 IMPLANT
CUFF TOURN SGL QUICK 18X4 (TOURNIQUET CUFF) ×2 IMPLANT
DRAPE FLUOR MINI C-ARM 54X84 (DRAPES) ×2 IMPLANT
DURAPREP 26ML APPLICATOR (WOUND CARE) ×2 IMPLANT
ELECT REM PT RETURN 9FT ADLT (ELECTROSURGICAL) ×2
ELECTRODE REM PT RTRN 9FT ADLT (ELECTROSURGICAL) ×1 IMPLANT
GAUZE SPONGE 4X4 12PLY STRL (GAUZE/BANDAGES/DRESSINGS) ×2 IMPLANT
GAUZE STRETCH 2X75IN STRL (MISCELLANEOUS) ×2 IMPLANT
GAUZE XEROFORM 1X8 LF (GAUZE/BANDAGES/DRESSINGS) ×2 IMPLANT
GLOVE BIO SURGEON STRL SZ7.5 (GLOVE) ×2 IMPLANT
GLOVE SURG UNDER LTX SZ8 (GLOVE) ×2 IMPLANT
GOWN STRL REUS W/ TWL LRG LVL3 (GOWN DISPOSABLE) ×2 IMPLANT
GOWN STRL REUS W/TWL LRG LVL3 (GOWN DISPOSABLE) ×4
HANDPIECE VERSAJET DEBRIDEMENT (MISCELLANEOUS) ×2 IMPLANT
IV NS IRRIG 3000ML ARTHROMATIC (IV SOLUTION) ×2 IMPLANT
KIT TURNOVER KIT A (KITS) ×2 IMPLANT
LABEL OR SOLS (LABEL) ×2 IMPLANT
MANIFOLD NEPTUNE II (INSTRUMENTS) ×2 IMPLANT
NDL FILTER BLUNT 18X1 1/2 (NEEDLE) ×1 IMPLANT
NDL HYPO 25X1 1.5 SAFETY (NEEDLE) ×2 IMPLANT
NEEDLE FILTER BLUNT 18X 1/2SAF (NEEDLE) ×1
NEEDLE FILTER BLUNT 18X1 1/2 (NEEDLE) ×1 IMPLANT
NEEDLE HYPO 25X1 1.5 SAFETY (NEEDLE) ×4 IMPLANT
NS IRRIG 500ML POUR BTL (IV SOLUTION) ×2 IMPLANT
PACK EXTREMITY ARMC (MISCELLANEOUS) ×2 IMPLANT
PAD ABD DERMACEA PRESS 5X9 (GAUZE/BANDAGES/DRESSINGS) ×4 IMPLANT
SOL PREP PVP 2OZ (MISCELLANEOUS) ×2
SOLUTION PREP PVP 2OZ (MISCELLANEOUS) ×1 IMPLANT
STOCKINETTE BIAS CUT 4 980044 (GAUZE/BANDAGES/DRESSINGS) ×2 IMPLANT
STOCKINETTE STRL 6IN 960660 (GAUZE/BANDAGES/DRESSINGS) ×2 IMPLANT
STRIP CLOSURE SKIN 1/4X4 (GAUZE/BANDAGES/DRESSINGS) ×2 IMPLANT
SUT ETHILON 3-0 FS-10 30 BLK (SUTURE) ×2
SUT ETHILON 4-0 (SUTURE)
SUT ETHILON 4-0 FS2 18XMFL BLK (SUTURE)
SUTURE EHLN 3-0 FS-10 30 BLK (SUTURE) ×1 IMPLANT
SUTURE ETHLN 4-0 FS2 18XMF BLK (SUTURE) IMPLANT
SWAB CULTURE AMIES ANAERIB BLU (MISCELLANEOUS) ×2 IMPLANT
SYR 10ML LL (SYRINGE) ×2 IMPLANT
WATER STERILE IRR 500ML POUR (IV SOLUTION) ×2 IMPLANT

## 2022-06-13 DIAGNOSIS — M86272 Subacute osteomyelitis, left ankle and foot: Secondary | ICD-10-CM | POA: Diagnosis not present

## 2022-06-13 DIAGNOSIS — F02A0 Dementia in other diseases classified elsewhere, mild, without behavioral disturbance, psychotic disturbance, mood disturbance, and anxiety: Secondary | ICD-10-CM | POA: Diagnosis not present

## 2022-06-13 DIAGNOSIS — G894 Chronic pain syndrome: Secondary | ICD-10-CM | POA: Diagnosis not present

## 2022-06-13 DIAGNOSIS — Z9189 Other specified personal risk factors, not elsewhere classified: Secondary | ICD-10-CM | POA: Diagnosis not present

## 2022-06-13 LAB — GLUCOSE, CAPILLARY
Glucose-Capillary: 311 mg/dL — ABNORMAL HIGH (ref 70–99)
Glucose-Capillary: 332 mg/dL — ABNORMAL HIGH (ref 70–99)
Glucose-Capillary: 218 mg/dL — ABNORMAL HIGH (ref 70–99)
Glucose-Capillary: 185 mg/dL — ABNORMAL HIGH (ref 70–99)
Glucose-Capillary: 255 mg/dL — ABNORMAL HIGH (ref 70–99)

## 2022-06-13 LAB — CREATININE, SERUM
Creatinine, Ser: 0.88 mg/dL (ref 0.44–1.00)
GFR, Estimated: >60 mL/min (ref >60)

## 2022-06-13 MED ORDER — LIDOCAINE 5 % EX PTCH
1.0000 | MEDICATED_PATCH | CUTANEOUS | Status: DC
  Administered 2022-06-14 – 2022-06-23 (×10): 1 via TRANSDERMAL
  Filled 2022-06-13 (×11): qty 1

## 2022-06-13 MED ORDER — INSULIN ASPART 100 UNIT/ML IJ SOLN
5.0000 [IU] | Freq: Three times a day (TID) | INTRAMUSCULAR | Status: DC
  Administered 2022-06-13 – 2022-06-15 (×7): 5 [IU] via SUBCUTANEOUS
  Filled 2022-06-13 (×6): qty 1

## 2022-06-13 MED ORDER — SODIUM CHLORIDE 0.9 % IV SOLN
2.0000 g | Freq: Two times a day (BID) | INTRAVENOUS | Status: DC
  Administered 2022-06-13 – 2022-06-16 (×8): 2 g via INTRAVENOUS
  Filled 2022-06-13 (×2): qty 12.5
  Filled 2022-06-13: qty 2
  Filled 2022-06-13 (×4): qty 12.5
  Filled 2022-06-13 (×2): qty 2

## 2022-06-13 MED ORDER — INSULIN GLARGINE-YFGN 100 UNIT/ML ~~LOC~~ SOLN: 10.0000 [IU] | Freq: Every day | SUBCUTANEOUS | Status: DC

## 2022-06-13 MED ORDER — INSULIN GLARGINE-YFGN 100 UNIT/ML ~~LOC~~ SOLN
10.0000 [IU] | Freq: Two times a day (BID) | SUBCUTANEOUS | Status: DC
  Administered 2022-06-13 – 2022-06-14 (×4): 10 [IU] via SUBCUTANEOUS
  Filled 2022-06-13 (×5): qty 0.1

## 2022-06-13 NOTE — Progress Notes (Signed)
AC would come and talk to patient about pain medication schedule.

## 2022-06-14 DIAGNOSIS — F02A0 Dementia in other diseases classified elsewhere, mild, without behavioral disturbance, psychotic disturbance, mood disturbance, and anxiety: Secondary | ICD-10-CM | POA: Diagnosis not present

## 2022-06-14 DIAGNOSIS — G894 Chronic pain syndrome: Secondary | ICD-10-CM | POA: Diagnosis not present

## 2022-06-14 DIAGNOSIS — Z9189 Other specified personal risk factors, not elsewhere classified: Secondary | ICD-10-CM | POA: Diagnosis not present

## 2022-06-14 DIAGNOSIS — M86272 Subacute osteomyelitis, left ankle and foot: Secondary | ICD-10-CM | POA: Diagnosis not present

## 2022-06-14 LAB — GLUCOSE, CAPILLARY
Glucose-Capillary: 164 mg/dL — ABNORMAL HIGH (ref 70–99)
Glucose-Capillary: 194 mg/dL — ABNORMAL HIGH (ref 70–99)
Glucose-Capillary: 156 mg/dL — ABNORMAL HIGH (ref 70–99)
Glucose-Capillary: 189 mg/dL — ABNORMAL HIGH (ref 70–99)

## 2022-06-14 MED ORDER — ENOXAPARIN SODIUM 40 MG/0.4ML IJ SOSY
40.0000 mg | PREFILLED_SYRINGE | INTRAMUSCULAR | Status: DC
  Administered 2022-06-14 – 2022-06-23 (×10): 40 mg via SUBCUTANEOUS
  Filled 2022-06-14 (×10): qty 0.4

## 2022-06-15 DIAGNOSIS — M86272 Subacute osteomyelitis, left ankle and foot: Secondary | ICD-10-CM | POA: Diagnosis not present

## 2022-06-15 DIAGNOSIS — Z9189 Other specified personal risk factors, not elsewhere classified: Secondary | ICD-10-CM | POA: Diagnosis not present

## 2022-06-15 DIAGNOSIS — F02A0 Dementia in other diseases classified elsewhere, mild, without behavioral disturbance, psychotic disturbance, mood disturbance, and anxiety: Secondary | ICD-10-CM | POA: Diagnosis not present

## 2022-06-15 DIAGNOSIS — E1140 Type 2 diabetes mellitus with diabetic neuropathy, unspecified: Secondary | ICD-10-CM | POA: Diagnosis not present

## 2022-06-15 DIAGNOSIS — I10 Essential (primary) hypertension: Secondary | ICD-10-CM | POA: Diagnosis not present

## 2022-06-15 DIAGNOSIS — F411 Generalized anxiety disorder: Secondary | ICD-10-CM | POA: Diagnosis not present

## 2022-06-15 DIAGNOSIS — G894 Chronic pain syndrome: Secondary | ICD-10-CM | POA: Diagnosis not present

## 2022-06-15 DIAGNOSIS — F331 Major depressive disorder, recurrent, moderate: Secondary | ICD-10-CM | POA: Diagnosis not present

## 2022-06-15 LAB — GLUCOSE, CAPILLARY
Glucose-Capillary: 204 mg/dL — ABNORMAL HIGH (ref 70–99)
Glucose-Capillary: 248 mg/dL — ABNORMAL HIGH (ref 70–99)
Glucose-Capillary: 221 mg/dL — ABNORMAL HIGH (ref 70–99)
Glucose-Capillary: 178 mg/dL — ABNORMAL HIGH (ref 70–99)

## 2022-06-15 MED ORDER — INSULIN ASPART 100 UNIT/ML IJ SOLN
7.0000 [IU] | Freq: Three times a day (TID) | INTRAMUSCULAR | Status: DC
Start: 2022-06-15 — End: 2022-06-18
  Administered 2022-06-15 – 2022-06-18 (×8): 7 [IU] via SUBCUTANEOUS
  Filled 2022-06-15 (×8): qty 1

## 2022-06-15 MED ORDER — POLYVINYL ALCOHOL 1.4 % OP SOLN
2.0000 [drp] | Freq: Two times a day (BID) | OPHTHALMIC | Status: DC | PRN
  Administered 2022-06-15 – 2022-06-19 (×3): 2 [drp] via OPHTHALMIC
  Filled 2022-06-15 (×2): qty 15

## 2022-06-15 MED ORDER — INSULIN GLARGINE-YFGN 100 UNIT/ML ~~LOC~~ SOLN
12.0000 [IU] | Freq: Two times a day (BID) | SUBCUTANEOUS | Status: DC
  Administered 2022-06-15 – 2022-06-16 (×3): 12 [IU] via SUBCUTANEOUS
  Filled 2022-06-15 (×3): qty 0.12

## 2022-06-15 MED ORDER — ACETAMINOPHEN 325 MG PO TABS
650.0000 mg | ORAL_TABLET | ORAL | Status: DC | PRN
  Administered 2022-06-15 – 2022-06-24 (×14): 650 mg via ORAL
  Filled 2022-06-15 (×16): qty 2

## 2022-06-15 NOTE — Progress Notes (Signed)
Physical Therapy Treatment Patient Details Name: Jessica Donovan MRN: [id number] DOB: [birth year] Today's Date: 06/15/2022   History of Present Illness Pt is a 76 year old female with history of anxiety, depression, hypertension, chronic pain, insulin-dependent diabetes mellitus, GERD, hyperlipidemia, obesity, insomnia, who presented to the ED on 06/11/22 for chief concerns of left toe ulcer. Pt underwent amputation of L great toe on 06/12/22 due to osteomyelitis.    PT Comments    Pt stated she has been up about 4 times this morning to use the bathroom and is exhausted. Pt performed bed mobility with supervision and transfers/ambulation with RW and CGA. Pt ambulated 20 feet in room followed by therapeutic exercises seated and supine. SpO2 was monitored upon completion of mobility and therapeutic exercises and was 91-92% on room air. Pt reported typically staying 93-94% and does not utilize oxygen at home. Pt was not SOB but reported increased fatigue with activity. Recommend monitoring SpO2 during mobility tasks at future sessions. Continue to recommend STR at discharge.   Recommendations for follow up therapy are one component of a multi-disciplinary discharge planning process, led by the attending physician.  Recommendations may be updated based on patient status, additional functional criteria and insurance authorization.  Follow Up Recommendations  Skilled nursing-short term rehab (<3 hours/day) Can patient physically be transported by private vehicle: Yes   Assistance Recommended at Discharge PRN  Patient can return home with the following A little help with walking and/or transfers;A little help with bathing/dressing/bathroom;Assistance with cooking/housework;Assist for transportation;Help with stairs or ramp for entrance   Equipment Recommendations       Recommendations for Other Services       Precautions / Restrictions Precautions Precautions: Fall Required Braces or  Orthoses: Other Brace Other Brace: L post-op shoe Restrictions Weight Bearing Restrictions: Yes LLE Weight Bearing: Weight bearing as tolerated (to the heel only)     Mobility  Bed Mobility Overal bed mobility: Needs Assistance Bed Mobility: Supine to Sit, Sit to Supine     Supine to sit: HOB elevated, Supervision Sit to supine: HOB elevated, Supervision   General bed mobility comments: Pt completed bed mobility with increased time/effort but no physical assist needed    Transfers Overall transfer level: Needs assistance Equipment used: Rolling walker (2 wheels) Transfers: Sit to/from Stand Sit to Stand: Min guard           General transfer comment: Pt completed sit-to-stand using bilat UEs pushing from the bed prior to reaching for RW.    Ambulation/Gait Ambulation/Gait assistance: Min guard Gait Distance (Feet): 20 Feet Assistive device: Rolling walker (2 wheels) Gait Pattern/deviations: Step-through pattern, Decreased stance time - left, Trunk flexed (weight bearing through L heel only without rockers due to L great toe amputation) Gait velocity: decreased     General Gait Details: step-through pattern with WB through L heel only, limiting rockers during stance phase of gait cycle   Stairs             Wheelchair Mobility    Modified Rankin (Stroke Patients Only)       Balance Overall balance assessment: History of Falls, Needs assistance Sitting-balance support: No upper extremity supported, Feet supported Sitting balance-Leahy Scale: Good Sitting balance - Comments: pt able to balance EOB with back unsupported; posterior lean with bilat UE support while completing LAQs   Standing balance support: Bilateral upper extremity supported Standing balance-Leahy Scale: Fair Standing balance comment: Pt able to ambulate with RW for bilat UE support without LOB.  Cognition Arousal/Alertness: Awake/alert Behavior  During Therapy: WFL for tasks assessed/performed Overall Cognitive Status: Within Functional Limits for tasks assessed                                 General Comments: A&Ox4        Exercises Other Exercises Other Exercises: Seated Ther-Ex included LAQs and Hip Flex x5 reps ea bilaterally; Supine ther-ex included SLR x5 bilaterally    General Comments        Pertinent Vitals/Pain Pain Assessment Pain Assessment: Faces Faces Pain Scale: Hurts a little bit Pain Location: L foot Pain Descriptors / Indicators: Aching Pain Intervention(s): Limited activity within patient's tolerance, Monitored during session, Repositioned, Premedicated before session    Home Living                          Prior Function            PT Goals (current goals can now be found in the care plan section) Acute Rehab PT Goals Patient Stated Goal: get better to get back home PT Goal Formulation: With patient Time For Goal Achievement: 06/27/22 Potential to Achieve Goals: Good Progress towards PT goals: Progressing toward goals    Frequency    Min 2X/week      PT Plan Current plan remains appropriate    Co-evaluation              AM-PAC PT "6 Clicks" Mobility   Outcome Measure  Help needed turning from your back to your side while in a flat bed without using bedrails?: A Little Help needed moving from lying on your back to sitting on the side of a flat bed without using bedrails?: A Little Help needed moving to and from a bed to a chair (including a wheelchair)?: A Little Help needed standing up from a chair using your arms (e.g., wheelchair or bedside chair)?: A Little Help needed to walk in hospital room?: A Little Help needed climbing 3-5 steps with a railing? : A Lot 6 Click Score: 17    End of Session Equipment Utilized During Treatment: Gait belt Activity Tolerance: Patient tolerated treatment well Patient left: in bed;with call bell/phone within  reach;Other (comment) (with pillow elevating L LE) Nurse Communication: Mobility status PT Visit Diagnosis: Unsteadiness on feet (R26.81);Muscle weakness (generalized) (M62.81);Difficulty in walking, not elsewhere classified (R26.2)     Time: 1914-7829 PT Time Calculation (min) (ACUTE ONLY): 14 min  Charges:                        Tattianna Schnarr, SPT  Dakiya Puopolo 06/15/2022, 1:22 PM

## 2022-06-15 NOTE — Progress Notes (Signed)
PROGRESS NOTE    Jessica Donovan  WUJ:[id number] DOB: [birth year] DOA: 06/11/2022 PCP: Mick Sell, MD    Brief Narrative:  [name], is a 76 year old female with history of anxiety, depression, hypertension, chronic pain, insulin-dependent diabetes mellitus, GERD, hyperlipidemia, obesity, insomnia, who presents to the emergency department for chief concerns of left toe ulcer.   Initial vitals in the emergency department showed temperature of 98.2, respiration rate of 16, heart rate of 79, blood pressure 129/75, SPO2 of 93% on room air.  Serum sodium is 138, potassium 4.0, chloride of 104, bicarb 24, BUN of 25, serum creatinine 0.75, nonfasting blood glucose 139, GFR greater than 60, WBC 8.4, hemoglobin 13.2, platelets of 301.  Lactic acid was 1.6.     Blood cultures x2 have been ordered.  UA ordered and pending collection.    6/23 status post OR   Podiatry cleared her for discharge.  Awaiting SNF placement.   Consultants:  Podiatry  Procedures:   Antimicrobials:      Subjective: Denies sob, cp, or pain  Objective: Vitals:   06/14/22 2002 06/15/22 0359 06/15/22 0847 06/15/22 0849  BP: 120/74 127/66 137/72 137/72  Pulse: 90 72 88 86  Resp: 18 17  18   Temp: (!) 97.5 F (36.4 C) 97.8 F (36.6 C)  97.6 F (36.4 C)  TempSrc:      SpO2: 93% 91%  92%  Weight:      Height:        Intake/Output Summary (Last 24 hours) at 06/15/2022 1556 Last data filed at 06/15/2022 1300 Gross per 24 hour  Intake 960 ml  Output --  Net 960 ml   Filed Weights   06/11/22 1338 06/12/22 0959  Weight: 81.2 kg 81.2 kg    Examination: Calm, NAD Cta no w/r Reg s1/s2 no gallop Soft benign +bs LLE wrapped in boots Aaoxox3  Mood and affect appropriate in current setting     Data Reviewed: I have personally reviewed following labs and imaging studies  CBC: Recent Labs  Lab 06/11/22 1341 06/12/22 0511  WBC 8.4 6.4  NEUTROABS 4.0  --   HGB 13.2 12.8   HCT 41.4 40.4  MCV 98.6 99.3  PLT 301 261   Basic Metabolic Panel: Recent Labs  Lab 06/11/22 1341 06/12/22 0511 06/13/22 0451  NA 138 142  --   K 4.0 4.4  --   CL 104 109  --   CO2 24 27  --   GLUCOSE 139* 163*  --   BUN 25* 19  --   CREATININE 0.75 0.74 0.88  CALCIUM 9.9 9.5  --    GFR: Estimated Creatinine Clearance: 59.4 mL/min (by C-G formula based on SCr of 0.88 mg/dL). Liver Function Tests: Recent Labs  Lab 06/11/22 1341  AST 25  ALT 13  ALKPHOS 62  BILITOT 0.4  PROT 7.1  ALBUMIN 4.3   No results for input(s): "LIPASE", "AMYLASE" in the last 168 hours. No results for input(s): "AMMONIA" in the last 168 hours. Coagulation Profile: No results for input(s): "INR", "PROTIME" in the last 168 hours. Cardiac Enzymes: No results for input(s): "CKTOTAL", "CKMB", "CKMBINDEX", "TROPONINI" in the last 168 hours. BNP (last 3 results) No results for input(s): "PROBNP" in the last 8760 hours. HbA1C: No results for input(s): "HGBA1C" in the last 72 hours.  CBG: Recent Labs  Lab 06/14/22 1129 06/14/22 1701 06/14/22 2100 06/15/22 0751 06/15/22 1141  GLUCAP 194* 156* 189* 204* 248*   Lipid Profile: No results  for input(s): "CHOL", "HDL", "LDLCALC", "TRIG", "CHOLHDL", "LDLDIRECT" in the last 72 hours. Thyroid Function Tests: No results for input(s): "TSH", "T4TOTAL", "FREET4", "T3FREE", "THYROIDAB" in the last 72 hours. Anemia Panel: No results for input(s): "VITAMINB12", "FOLATE", "FERRITIN", "TIBC", "IRON", "RETICCTPCT" in the last 72 hours. Sepsis Labs: Recent Labs  Lab 06/11/22 1341 06/11/22 1823  LATICACIDVEN 1.6 1.1    Recent Results (from the past 240 hour(s))  Blood culture (routine x 2)     Status: None (Preliminary result)   Collection Time: 06/11/22  1:41 PM   Specimen: BLOOD  Result Value Ref Range Status   Specimen Description BLOOD RIGHT FA  Final   Special Requests   Final    BOTTLES DRAWN AEROBIC AND ANAEROBIC Blood Culture adequate volume    Culture   Final    NO GROWTH 4 DAYS Performed at [facility], [address], [place], [place] [address]    Report Status PENDING  Incomplete  Blood culture (routine x 2)     Status: None (Preliminary result)   Collection Time: 06/11/22  4:04 PM   Specimen: BLOOD  Result Value Ref Range Status   Specimen Description BLOOD RIGHT ANTECUBITAL  Final   Special Requests   Final    BOTTLES DRAWN AEROBIC AND ANAEROBIC Blood Culture adequate volume   Culture   Final    NO GROWTH 4 DAYS Performed at [facility], [address] [place], [place] [address]    Report Status PENDING  Incomplete  Aerobic/Anaerobic Culture w Gram Stain (surgical/deep wound)     Status: None (Preliminary result)   Collection Time: 06/12/22 11:05 AM   Specimen: Bone; Tissue  Result Value Ref Range Status   Specimen Description   Final    BONE Performed at [facility], [address], [place], [place] [address]    Special Requests LEFT GREAT TOE  Final   Gram Stain NO WBC SEEN NO ORGANISMS SEEN   Final   Culture   Final    CULTURE REINCUBATED FOR BETTER GROWTH Performed at [facility], [address], [place], [place] [address]    Report Status PENDING  Incomplete         Radiology Studies: No results found.      Scheduled Meds:  atorvastatin  20 mg Oral Daily   enoxaparin (LOVENOX) injection  40 mg Subcutaneous Q24H   insulin aspart  0-15 Units Subcutaneous TID WC   insulin aspart  0-5 Units Subcutaneous QHS   insulin aspart  5 Units Subcutaneous TID WC   insulin glargine-yfgn  12 Units Subcutaneous BID   isosorbide mononitrate  30 mg Oral Daily   lidocaine  1 patch Transdermal Q24H   lisinopril  20 mg Oral Daily   metoprolol tartrate  25 mg Oral BID   mirtazapine  15 mg Oral QHS   pantoprazole  40 mg Oral Daily   pregabalin  100 mg Oral TID   traZODone  100 mg Oral QHS   venlafaxine XR  75 mg Oral BID   Continuous Infusions:  ceFEPime  (MAXIPIME) IV 2 g (06/15/22 0851)   vancomycin 1,500 mg (06/14/22 1958)    Assessment & Plan:   Principal Problem:   Osteomyelitis (HCC) Active Problems:   HTN (hypertension)   Dementia (HCC)   Chronic pain syndrome   Generalized anxiety disorder   Moderate recurrent major depression (HCC)   Type 2 diabetes mellitus with diabetic neuropathy (HCC)   At risk for polypharmacy  Osteomyelitis (HCC) - Cefepime and vancomycin per pharmacy ordered Podiatry was consulted, status post amputation of left great toe 6/26 continue current pain management  PT recommends SNF pending     HTN (hypertension) Stable continue lisinopril and current management   At risk for polypharmacy Continue Xanax 6/26 PCP following patient and trying to wean her medication      Type 2 diabetes mellitus with diabetic neuropathy (HCC) BG improving. Increase novology tid with meals to 7units Continue lantus and riss    Moderate recurrent major depression (HCC) Continue home meds mirtazapine   Generalized anxiety disorder - Trazodone 100 mg nightly, venlafaxine 75 mg p.o. twice daily, alprazolam 0.5 mg p.o. twice daily as needed for anxiety ordered   DVT prophylaxis: Lovenox Code Status: Full   Family Communication: None at bedside Disposition Plan: SNF Status is: Inpatient Patient remains inpatient : due to unsafe discharge. SNF pending       LOS: 3 days   Time spent: 35 minutes    Lynn Ito, MD [facility] Pager [phone]  If 7PM-7AM, please contact night-coverage 06/15/2022, 3:56 PM

## 2022-06-16 DIAGNOSIS — I10 Essential (primary) hypertension: Secondary | ICD-10-CM | POA: Diagnosis not present

## 2022-06-16 DIAGNOSIS — G894 Chronic pain syndrome: Secondary | ICD-10-CM | POA: Diagnosis not present

## 2022-06-16 DIAGNOSIS — F02A0 Dementia in other diseases classified elsewhere, mild, without behavioral disturbance, psychotic disturbance, mood disturbance, and anxiety: Secondary | ICD-10-CM | POA: Diagnosis not present

## 2022-06-16 DIAGNOSIS — M86272 Subacute osteomyelitis, left ankle and foot: Secondary | ICD-10-CM | POA: Diagnosis not present

## 2022-06-16 DIAGNOSIS — E1140 Type 2 diabetes mellitus with diabetic neuropathy, unspecified: Secondary | ICD-10-CM | POA: Diagnosis not present

## 2022-06-16 DIAGNOSIS — Z9189 Other specified personal risk factors, not elsewhere classified: Secondary | ICD-10-CM | POA: Diagnosis not present

## 2022-06-16 DIAGNOSIS — F331 Major depressive disorder, recurrent, moderate: Secondary | ICD-10-CM | POA: Diagnosis not present

## 2022-06-16 LAB — CBC
HCT: 42.1 % (ref 36.0–46.0)
Hemoglobin: 13.3 g/dL (ref 12.0–15.0)
MCH: 31.3 pg (ref 26.0–34.0)
MCHC: 31.6 g/dL (ref 30.0–36.0)
MCV: 99.1 fL (ref 80.0–100.0)
Platelets: 261 10*3/uL (ref 150–400)
RBC: 4.25 MIL/uL (ref 3.87–5.11)
RDW: 12.6 % (ref 11.5–15.5)
WBC: 8.2 10*3/uL (ref 4.0–10.5)
nRBC: 0 % (ref 0.0–0.2)

## 2022-06-16 LAB — AEROBIC/ANAEROBIC CULTURE W GRAM STAIN (SURGICAL/DEEP WOUND): Gram Stain: NONE SEEN

## 2022-06-16 LAB — GLUCOSE, CAPILLARY
Glucose-Capillary: 213 mg/dL — ABNORMAL HIGH (ref 70–99)
Glucose-Capillary: 187 mg/dL — ABNORMAL HIGH (ref 70–99)
Glucose-Capillary: 185 mg/dL — ABNORMAL HIGH (ref 70–99)
Glucose-Capillary: 213 mg/dL — ABNORMAL HIGH (ref 70–99)

## 2022-06-16 LAB — CREATININE, SERUM
Creatinine, Ser: 0.69 mg/dL (ref 0.44–1.00)
GFR, Estimated: >60 mL/min (ref >60)

## 2022-06-16 LAB — CULTURE, BLOOD (ROUTINE X 2)
Culture: NO GROWTH
Culture: NO GROWTH
Special Requests: ADEQUATE
Special Requests: ADEQUATE

## 2022-06-16 LAB — SURGICAL PATHOLOGY

## 2022-06-16 MED ORDER — INSULIN GLARGINE-YFGN 100 UNIT/ML ~~LOC~~ SOLN
17.0000 [IU] | Freq: Two times a day (BID) | SUBCUTANEOUS | Status: DC
  Administered 2022-06-16: 17 [IU] via SUBCUTANEOUS
  Filled 2022-06-16 (×2): qty 0.17

## 2022-06-16 MED ORDER — INSULIN GLARGINE-YFGN 100 UNIT/ML ~~LOC~~ SOLN
3.0000 [IU] | Freq: Once | SUBCUTANEOUS | Status: AC
  Administered 2022-06-16: 3 [IU] via SUBCUTANEOUS
  Filled 2022-06-16: qty 0.03

## 2022-06-16 NOTE — TOC Progression Note (Signed)
Transition of Care [facility]) - Progression Note    Patient Details  Name: Jessica Donovan MRN: [id number] Date of Birth: 03/25/1946  Transition of Care [facility]) CM/SW Contact  Durwin Glaze, LCSW Phone Number: 06/16/2022, 12:02 PM  Clinical Narrative:    CSW contacted pt per pt request to discuss bed offers. CSW explianed insurance situation to patient. Patient states she has Medicare and wants [facility]. CSW contacted [facility] and [address] is out until tomorrow. CSW left voicemail. CSW notified pt and pat wants to wait on [facility] to reconsider.      Barriers to Discharge: Continued Medical Work up  Expected Discharge Plan and Services         Living arrangements for the past 2 months: Single Family Home                                       Social Determinants of Health (SDOH) Interventions    Readmission Risk Interventions    06/13/2022   10:24 AM  Readmission Risk Prevention Plan  Post Dischage Appt Complete  Medication Screening Complete  Transportation Screening Complete

## 2022-06-17 DIAGNOSIS — M86172 Other acute osteomyelitis, left ankle and foot: Secondary | ICD-10-CM | POA: Diagnosis not present

## 2022-06-17 DIAGNOSIS — M86272 Subacute osteomyelitis, left ankle and foot: Secondary | ICD-10-CM | POA: Diagnosis not present

## 2022-06-17 LAB — GLUCOSE, CAPILLARY
Glucose-Capillary: 228 mg/dL — ABNORMAL HIGH (ref 70–99)
Glucose-Capillary: 278 mg/dL — ABNORMAL HIGH (ref 70–99)
Glucose-Capillary: 142 mg/dL — ABNORMAL HIGH (ref 70–99)
Glucose-Capillary: 190 mg/dL — ABNORMAL HIGH (ref 70–99)

## 2022-06-17 MED ORDER — DOXYCYCLINE HYCLATE 100 MG PO TABS: 100.0000 mg | ORAL_TABLET | Freq: Two times a day (BID) | ORAL | Status: DC

## 2022-06-17 MED ORDER — INSULIN GLARGINE-YFGN 100 UNIT/ML ~~LOC~~ SOLN
34.0000 [IU] | Freq: Every day | SUBCUTANEOUS | Status: DC
  Administered 2022-06-17 – 2022-06-18 (×2): 34 [IU] via SUBCUTANEOUS
  Filled 2022-06-17 (×2): qty 0.34

## 2022-06-17 MED ORDER — LIVING WELL WITH DIABETES BOOK
Freq: Once | Status: AC
  Filled 2022-06-17: qty 1

## 2022-06-17 MED ORDER — DOXYCYCLINE HYCLATE 100 MG PO TABS
100.0000 mg | ORAL_TABLET | Freq: Two times a day (BID) | ORAL | Status: AC
  Administered 2022-06-17 – 2022-06-19 (×6): 100 mg via ORAL
  Filled 2022-06-17 (×6): qty 1

## 2022-06-17 NOTE — Care Management Important Message (Signed)
Important Message  Patient Details  Name: Moria Brophy MRN: [id number] Date of Birth: 11-21-1946   Medicare Important Message Given:  Yes     Olegario Messier A Clayvon Parlett 06/17/2022, 2:15 PM

## 2022-06-17 NOTE — Plan of Care (Signed)

## 2022-06-17 NOTE — Progress Notes (Signed)
Physical Therapy Treatment Patient Details Name: Jessica Donovan MRN: [id number] DOB: 1946/05/02 Today's Date: 06/17/2022   History of Present Illness Pt is a 76 year old female with history of anxiety, depression, hypertension, chronic pain, insulin-dependent diabetes mellitus, GERD, hyperlipidemia, obesity, insomnia, who presented to the ED on 06/11/22 for chief concerns of left toe ulcer. Pt underwent amputation of L great toe on 06/12/22 due to osteomyelitis.    PT Comments    Pt had just returned to recliner after ambulating to/from bathroom. Declined further mobility, however agreed to B LE Strengthening exercises completed in sitting. Pt remains very motivated to improve and return home. Currently awaiting transition to SNF for short term rehab. Continue PT per POC.   Recommendations for follow up therapy are one component of a multi-disciplinary discharge planning process, led by the attending physician.  Recommendations may be updated based on patient status, additional functional criteria and insurance authorization.  Follow Up Recommendations        Assistance Recommended at Discharge    Patient can return home with the following     Equipment Recommendations       Recommendations for Other Services       Precautions / Restrictions Precautions Precautions: Fall Required Braces or Orthoses: Other Brace Other Brace: L post-op shoe Restrictions Weight Bearing Restrictions: Yes LLE Weight Bearing: Weight bearing as tolerated     Mobility  Bed Mobility               General bed mobility comments: Pt received in recliner    Transfers                   General transfer comment: declined    Ambulation/Gait                   Stairs             Wheelchair Mobility    Modified Rankin (Stroke Patients Only)       Balance                                            Cognition Arousal/Alertness:  Awake/alert Behavior During Therapy: WFL for tasks assessed/performed Overall Cognitive Status: Within Functional Limits for tasks assessed                                 General Comments: A&Ox4        Exercises General Exercises - Lower Extremity Ankle Circles/Pumps: AROM, Both, 10 reps, Seated Gluteal Sets: AROM, Both, 10 reps, Seated Long Arc Quad: AROM, Both, 10 reps, Seated Hip ABduction/ADduction: AROM, Both, 10 reps, Seated Hip Flexion/Marching: AROM, Both, 10 reps, Seated    General Comments        Pertinent Vitals/Pain Pain Assessment Pain Assessment: No/denies pain    Home Living                          Prior Function            PT Goals (current goals can now be found in the care plan section) Acute Rehab PT Goals Patient Stated Goal: get better to get back home Progress towards PT goals: Progressing toward goals    Frequency           PT Plan  Co-evaluation              AM-PAC PT "6 Clicks" Mobility   Outcome Measure  Help needed turning from your back to your side while in a flat bed without using bedrails?: A Little Help needed moving from lying on your back to sitting on the side of a flat bed without using bedrails?: A Little Help needed moving to and from a bed to a chair (including a wheelchair)?: A Little Help needed standing up from a chair using your arms (e.g., wheelchair or bedside chair)?: A Little Help needed to walk in hospital room?: A Little Help needed climbing 3-5 steps with a railing? : A Lot 6 Click Score: 17    End of Session               Time: 1000-1020 PT Time Calculation (min) (ACUTE ONLY): 20 min  Charges:  $Therapeutic Exercise: 8-22 mins                    Zadie Cleverly, PTA    Jannet Askew 06/17/2022, 11:26 AM

## 2022-06-17 NOTE — Progress Notes (Signed)
5 Days Post-Op   Subjective/Chief Complaint: Patient seen.  No complaints today.  No complaints of pain.   Objective: Vital signs in last 24 hours: Temp:  [97.9 F (36.6 C)-98.4 F (36.9 C)] 97.9 F (36.6 C) (06/28 0530) Pulse Rate:  [72-91] 72 (06/28 0530) Resp:  [17-18] 17 (06/28 0530) BP: (115-131)/(58-75) 131/65 (06/28 0530) SpO2:  [91 %-92 %] 91 % (06/28 0530) Last BM Date : 06/16/22  Intake/Output from previous day: No intake/output data recorded. Intake/Output this shift: Total I/O In: 240 [P.O.:240] Out: -   Incision on the left foot is dry and intact.  Upon removal there is some mild dried blood and drainage from around the dorsal aspect of the incision.  No expressible purulence.  No significant erythema and edema improved.  Skin edges viable and overall looks good.   Lab Results:  Recent Labs    06/16/22 0348  WBC 8.2  HGB 13.3  HCT 42.1  PLT 261   BMET Recent Labs    06/16/22 0348  CREATININE 0.69   PT/INR No results for input(s): "LABPROT", "INR" in the last 72 hours. ABG No results for input(s): "PHART", "HCO3" in the last 72 hours.  Invalid input(s): "PCO2", "PO2"  Studies/Results: No results found.  Anti-infectives: Anti-infectives (From admission, onward)    Start     Dose/Rate Route Frequency Ordered Stop   06/17/22 1000  doxycycline (VIBRA-TABS) tablet 100 mg  Status:  Discontinued        100 mg Oral Every 12 hours 06/17/22 0811 06/17/22 0814   06/17/22 1000  doxycycline (VIBRA-TABS) tablet 100 mg        100 mg Oral Every 12 hours 06/17/22 0814 06/20/22 0959   06/13/22 1000  ceFEPIme (MAXIPIME) 2 g in sodium chloride 0.9 % 100 mL IVPB  Status:  Discontinued        2 g 200 mL/hr over 30 Minutes Intravenous Every 12 hours 06/13/22 0725 06/17/22 0808   06/12/22 2000  vancomycin (VANCOREADY) IVPB 1500 mg/300 mL  Status:  Discontinued        1,500 mg 150 mL/hr over 120 Minutes Intravenous Every 24 hours 06/11/22 1706 06/17/22 0808    06/11/22 1700  vancomycin (VANCOREADY) IVPB 1750 mg/350 mL        1,750 mg 175 mL/hr over 120 Minutes Intravenous  Once 06/11/22 1658 06/11/22 1945   06/11/22 1700  ceFEPIme (MAXIPIME) 2 g in sodium chloride 0.9 % 100 mL IVPB  Status:  Discontinued        2 g 200 mL/hr over 30 Minutes Intravenous Every 8 hours 06/11/22 1658 06/13/22 0725       Assessment/Plan: s/p Procedure(s): AMPUTATION TOE; PARTIAL AMPUTATION LEFT GREAT TOE (Left) Assessment: Stable status post amputation left hallux.  Plan: Betadine and a sterile bandage reapplied to the left foot.  Originally had scheduled the patient to come in this Friday but at this point I will follow her up in 2 weeks outpatient in the office for suture removal.  Keep the bandage clean, dry.  Wound may be assessed at skilled nursing if needed and they could also remove the sutures after next Friday if the incision looks stable.  Podiatry will sign off at this point.  LOS: 5 days    Jessica Donovan 06/17/2022

## 2022-06-17 NOTE — Progress Notes (Signed)
[facility]  - [place] [facility]   PATIENT NAME: Jessica Donovan    MR#:  [id number]  DATE OF BIRTH:  1946/09/11  SUBJECTIVE:   patient overall doing well. Denies any complaints. Sugars improving. Slept well. No family at bedside.   VITALS:  Blood pressure 131/65, pulse 72, temperature 97.9 F (36.6 C), resp. rate 17, height 5\' 6"  (1.676 m), weight 81.2 kg, SpO2 91 %.  PHYSICAL EXAMINATION:   GENERAL:  76 y.o.-year-old patient lying in the bed with no acute distress.  LUNGS: Normal breath sounds bilaterally, no wheezing, rales, rhonchi.  CARDIOVASCULAR: S1, S2 normal. No murmurs, rubs, or gallops.  ABDOMEN: Soft, nontender, nondistended. Bowel sounds present.  EXTREMITIES: left great toe dressing present. NEUROLOGIC: nonfocal  patient is alert and awake SKIN: No obvious rash, lesion, or ulcer.   LABORATORY PANEL:  CBC Recent Labs  Lab 06/16/22 0348  WBC 8.2  HGB 13.3  HCT 42.1  PLT 261    Chemistries  Recent Labs  Lab 06/11/22 1341 06/12/22 0511 06/13/22 0451 06/16/22 0348  NA 138 142  --   --   K 4.0 4.4  --   --   CL 104 109  --   --   CO2 24 27  --   --   GLUCOSE 139* 163*  --   --   BUN 25* 19  --   --   CREATININE 0.75 0.74   < > 0.69  CALCIUM 9.9 9.5  --   --   AST 25  --   --   --   ALT 13  --   --   --   ALKPHOS 62  --   --   --   BILITOT 0.4  --   --   --    < > = values in this interval not displayed.   Cardiac Enzymes No results for input(s): "TROPONINI" in the last 168 hours. RADIOLOGY:  No results found.  Assessment and Plan  [name], is a 76 year old female with history of anxiety, depression, hypertension, chronic pain, insulin-dependent diabetes mellitus, GERD, hyperlipidemia, obesity, insomnia, who presents to the emergency department for chief concerns of left toe ulcer.  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 06/14/22 podiatry -- continue present  pain meds -- patient will follow-up with podiatry as outpatient. Leave current dressing on as per instruction from podiatry per note 6/24 -- change to PO doxycycline-- will complete total 10 days of antibiotic  Hypertension -- continue present meds  type II diabetes with diabetic neuropathy -- currently on long-acting semglee and aspart with sliding scale -- A1c 7.8  history of depression -- continue Remeron  generalized anxiety disorder -- continue Effexor, Xanax, trazodone  history of substance abuse (per notes--pain meds) --cont Present pain meds   Procedures: amputation and left great toe Family communication : per patient no need for informing family Consults : podiatry CODE STATUS: full DVT Prophylaxis : enoxaparin Level of care: Med-Surg Status is: Inpatient Remains inpatient appropriate because: awaiting [facility] to see if they can offer bed.  Patient is medically best at baseline for discharge    TOTAL TIME TAKING CARE OF THIS PATIENT: 25 minutes.  >50% time spent on counselling and coordination of care  Note: This dictation was prepared with Dragon dictation along with smaller phrase technology. Any transcriptional errors that result from this process are unintentional.  7/24.D  [facility]   CC: Primary care physician; Mick Sell, MD

## 2022-06-18 DIAGNOSIS — M86272 Subacute osteomyelitis, left ankle and foot: Secondary | ICD-10-CM | POA: Diagnosis not present

## 2022-06-18 LAB — GLUCOSE, CAPILLARY
Glucose-Capillary: 211 mg/dL — ABNORMAL HIGH (ref 70–99)
Glucose-Capillary: 278 mg/dL — ABNORMAL HIGH (ref 70–99)
Glucose-Capillary: 189 mg/dL — ABNORMAL HIGH (ref 70–99)
Glucose-Capillary: 174 mg/dL — ABNORMAL HIGH (ref 70–99)

## 2022-06-18 MED ORDER — INSULIN ASPART 100 UNIT/ML IJ SOLN
10.0000 [IU] | Freq: Three times a day (TID) | INTRAMUSCULAR | Status: DC
  Administered 2022-06-18 (×2): 10 [IU] via SUBCUTANEOUS
  Filled 2022-06-18 (×2): qty 1

## 2022-06-18 MED ORDER — INSULIN GLARGINE-YFGN 100 UNIT/ML ~~LOC~~ SOLN
35.0000 [IU] | Freq: Every day | SUBCUTANEOUS | Status: DC
  Filled 2022-06-18: qty 0.35

## 2022-06-18 NOTE — Progress Notes (Signed)
Physical Therapy Treatment Patient Details Name: Jessica Donovan MRN: [id number] DOB: 09/11/1946 Today's Date: 06/18/2022   History of Present Illness Pt is a 76 year old female with history of anxiety, depression, hypertension, chronic pain, insulin-dependent diabetes mellitus, GERD, hyperlipidemia, obesity, insomnia, who presented to the ED on 06/11/22 for chief concerns of left toe ulcer. Pt underwent amputation of L great toe on 06/12/22 due to osteomyelitis.    PT Comments    Pt was pleasant and motivated to participate during the session and put forth good effort throughout. Pt able to complete sit to stands w/ CGA given but not needed; slightly impulsive when coming to stand before RW is in place. Pt able to ambulate 72ft x1 and 27ft x1 using RW w/ CGA following verbal cuing for sequencing and cuing to maintain Wbing through L heel. Slow effortful gait and reliant on UE support but no LOB. Of note, pt expressed suicidal ideation if there is any escalation for further amputation of LEs; MD notified. Pt will benefit from PT services in a SNF setting upon discharge to safely address deficits listed in patient problem list for decreased caregiver assistance and eventual return to PLOF.    Recommendations for follow up therapy are one component of a multi-disciplinary discharge planning process, led by the attending physician.  Recommendations may be updated based on patient status, additional functional criteria and insurance authorization.  Follow Up Recommendations  Skilled nursing-short term rehab (<3 hours/day) Can patient physically be transported by private vehicle: Yes   Assistance Recommended at Discharge Intermittent Supervision/Assistance  Patient can return home with the following A little help with walking and/or transfers;A little help with bathing/dressing/bathroom;Assistance with cooking/housework;Assist for transportation;Help with stairs or ramp for entrance   Equipment  Recommendations  Rolling walker (2 wheels)    Recommendations for Other Services       Precautions / Restrictions Precautions Precautions: Fall Required Braces or Orthoses: Other Brace Other Brace: L post-op shoe Restrictions Weight Bearing Restrictions: Yes LLE Weight Bearing: Weight bearing as tolerated Other Position/Activity Restrictions: WBAT through the heel only     Mobility  Bed Mobility Overal bed mobility: Needs Assistance Bed Mobility: Sit to Supine, Supine to Sit     Supine to sit: Min guard Sit to supine: Min guard        Transfers Overall transfer level: Needs assistance Equipment used: Rolling walker (2 wheels) Transfers: Sit to/from Stand Sit to Stand: Min guard                Ambulation/Gait Ambulation/Gait assistance: Min guard Gait Distance (Feet): 20 Feet x1; 7 Feet x1 Assistive device: Rolling walker (2 wheels) Gait Pattern/deviations: Step-through pattern, Decreased stance time - left, Trunk flexed, Decreased step length - right, Decreased step length - left Gait velocity: decreased     General Gait Details: verbal cuing for sequencing w/ RW   Stairs             Wheelchair Mobility    Modified Rankin (Stroke Patients Only)       Balance Overall balance assessment: Needs assistance Sitting-balance support: Bilateral upper extremity supported, Feet supported Sitting balance-Leahy Scale: Good     Standing balance support: Bilateral upper extremity supported, During functional activity Standing balance-Leahy Scale: Fair                              Cognition Arousal/Alertness: Awake/alert Behavior During Therapy: WFL for tasks assessed/performed Overall Cognitive Status: Within  Functional Limits for tasks assessed                                          Exercises General Exercises - Lower Extremity Ankle Circles/Pumps: Strengthening, Both, 10 reps Long Arc Quad: Strengthening, Both,  10 reps Heel Slides: Strengthening, Both, 10 reps Hip Flexion/Marching: Seated, Strengthening, Both, 10 reps    General Comments        Pertinent Vitals/Pain Pain Assessment Pain Assessment: 0-10 Pain Score: 5  Pain Location: L foot Pain Descriptors / Indicators: Aching Pain Intervention(s): Monitored during session, Premedicated before session, Repositioned    Home Living                          Prior Function            PT Goals (current goals can now be found in the care plan section) Progress towards PT goals: Progressing toward goals    Frequency    Min 2X/week      PT Plan Current plan remains appropriate    Co-evaluation              AM-PAC PT "6 Clicks" Mobility   Outcome Measure  Help needed turning from your back to your side while in a flat bed without using bedrails?: A Little Help needed moving from lying on your back to sitting on the side of a flat bed without using bedrails?: A Little Help needed moving to and from a bed to a chair (including a wheelchair)?: A Little Help needed standing up from a chair using your arms (e.g., wheelchair or bedside chair)?: A Little Help needed to walk in hospital room?: A Little Help needed climbing 3-5 steps with a railing? : A Lot 6 Click Score: 17    End of Session Equipment Utilized During Treatment: Gait belt Activity Tolerance: Patient tolerated treatment well Patient left: Other (comment) (pt left in bathroom for toileting w/ RN notified) Nurse Communication: Mobility status PT Visit Diagnosis: Unsteadiness on feet (R26.81);Muscle weakness (generalized) (M62.81);Difficulty in walking, not elsewhere classified (R26.2);Pain Pain - Right/Left: Right Pain - part of body:  (foot)     Time: 2536-6440 PT Time Calculation (min) (ACUTE ONLY): 32 min  Charges:                       Marica Otter, SPT  06/18/2022, 3:12 PM

## 2022-06-18 NOTE — TOC Progression Note (Signed)
Transition of Care [facility]) - Progression Note    Patient Details  Name: Jessica Donovan MRN: [id number] Date of Birth: 09/16/1946  Transition of Care [facility]) CM/SW Contact  Durwin Glaze, LCSW Phone Number: 06/18/2022, 12:54 PM  Clinical Narrative:    CSW spoke with Boneta Lucks with [facility] regarding placement options for pt. CSW explained that pt has declined her only bed offer. CSW explained that the search was extended and pt does not want to be in any other area besides this area and [place]. CSW advised that contact has been attempted with the [place] option.      Barriers to Discharge: Continued Medical Work up  Expected Discharge Plan and Services         Living arrangements for the past 2 months: Single Family Home                                       Social Determinants of Health (SDOH) Interventions    Readmission Risk Interventions    06/13/2022   10:24 AM  Readmission Risk Prevention Plan  Post Dischage Appt Complete  Medication Screening Complete  Transportation Screening Complete

## 2022-06-18 NOTE — TOC Progression Note (Addendum)
Transition of Care [facility]) - Progression Note    Patient Details  Name: Jessica Donovan MRN: [id number] Date of Birth: 1946-11-19  Transition of Care [facility]) CM/SW Contact  Durwin Glaze, LCSW Phone Number: 06/18/2022, 7:27 AM  Clinical Narrative:    CSW spoke with pt and informed her that [facility] does not have any bed availability. Pt said she will only consider [facility], no other options. [facility] declined. CSW will reach out to ask them to reconsider based on her secondary insurance.  CSW contacted Tammy with [facility] to see if they could reconsider, Tammy stated that corporate is not allowing them to make a bed offer to [facility] patients as of yet. [employer] is pt's primary insurance.  CSW contacted pt and notified pt. Pt wants CSW to look for places in [place], [place].     Barriers to Discharge: Continued Medical Work up  Expected Discharge Plan and Services         Living arrangements for the past 2 months: Single Family Home                                       Social Determinants of Health (SDOH) Interventions    Readmission Risk Interventions    06/13/2022   10:24 AM  Readmission Risk Prevention Plan  Post Dischage Appt Complete  Medication Screening Complete  Transportation Screening Complete

## 2022-06-18 NOTE — Progress Notes (Signed)
[facility]  - [place] at [facility]   PATIENT NAME: Jessica Donovan    MR#:  [id number]  DATE OF BIRTH:  Sep 05, 1946  SUBJECTIVE:   patient overall doing well. Denies any complaints. Sugars improving. Slept well. No family at bedside.   VITALS:  Blood pressure 129/67, pulse 70, temperature (!) 97.4 F (36.3 C), resp. rate 16, height 5\' 6"  (1.676 m), weight 81.2 kg, SpO2 93 %.  PHYSICAL EXAMINATION:   GENERAL:  76 y.o.-year-old patient lying in the bed with no acute distress.  LUNGS: Normal breath sounds bilaterally, no wheezing, rales, rhonchi.  CARDIOVASCULAR: S1, S2 normal. No murmurs, rubs, or gallops.  ABDOMEN: Soft, nontender, nondistended. Bowel sounds present.  EXTREMITIES: left great toe   NEUROLOGIC: nonfocal  patient is alert and awake   LABORATORY PANEL:  CBC Recent Labs  Lab 06/16/22 0348  WBC 8.2  HGB 13.3  HCT 42.1  PLT 261     Chemistries  Recent Labs  Lab 06/11/22 1341 06/12/22 0511 06/13/22 0451 06/16/22 0348  NA 138 142  --   --   K 4.0 4.4  --   --   CL 104 109  --   --   CO2 24 27  --   --   GLUCOSE 139* 163*  --   --   BUN 25* 19  --   --   CREATININE 0.75 0.74   < > 0.69  CALCIUM 9.9 9.5  --   --   AST 25  --   --   --   ALT 13  --   --   --   ALKPHOS 62  --   --   --   BILITOT 0.4  --   --   --    < > = values in this interval not displayed.    Cardiac Enzymes No results for input(s): "TROPONINI" in the last 168 hours. RADIOLOGY:  No results found.  Assessment and Plan  [name], is a 76 year old female with history of anxiety, depression, hypertension, chronic pain, insulin-dependent diabetes mellitus, GERD, hyperlipidemia, obesity, insomnia, who presents to the emergency department for chief concerns of left toe ulcer.  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 06/14/22 podiatry -- continue present pain meds -- patient will follow-up with podiatry  as outpatient. Leave current dressing on as per instruction from podiatry per note 6/24 -- change to PO doxycycline-- will complete total 10 days of antibiotic --Per Dr cline " Betadine and a sterile bandage reapplied to the left foot.  Originally had scheduled the patient to come in this Friday but at this point I will follow her up in 2 weeks outpatient in the office for suture removal.  Keep the bandage clean, dry.  Wound may be assessed at skilled nursing if needed and they could also remove the sutures after next Friday if the incision looks stable"  Hypertension -- continue present meds  type II diabetes with diabetic neuropathy -- currently on long-acting semglee and aspart with sliding scale -- A1c 7.8  history of depression -- continue Remeron  generalized anxiety disorder -- continue Effexor, Xanax, trazodone  history of substance abuse (per notes--pain meds) --cont Present pain meds   Procedures: amputation and left great toe Family communication : per patient no need for informing family Consults : podiatry CODE STATUS: full DVT Prophylaxis : enoxaparin Level of care: Med-Surg Status is: Inpatient Remains inpatient appropriate because:  pt is refusing the bed offer she has and wants to try [facility] rehab place. I have d/w pt that if no offers come in by tomorrow then she will have to consider [facility]. Pt voiced understanding.  Patient is medically best at baseline for discharge    TOTAL TIME TAKING CARE OF THIS PATIENT: 25 minutes.  >50% time spent on counselling and coordination of care  Note: This dictation was prepared with Dragon dictation along with smaller phrase technology. Any transcriptional errors that result from this process are unintentional.  [name] M.D    [facility]   CC: Primary care physician; Mick Sell, MD

## 2022-06-19 DIAGNOSIS — M86272 Subacute osteomyelitis, left ankle and foot: Secondary | ICD-10-CM | POA: Diagnosis not present

## 2022-06-19 LAB — GLUCOSE, CAPILLARY
Glucose-Capillary: 186 mg/dL — ABNORMAL HIGH (ref 70–99)
Glucose-Capillary: 290 mg/dL — ABNORMAL HIGH (ref 70–99)
Glucose-Capillary: 168 mg/dL — ABNORMAL HIGH (ref 70–99)
Glucose-Capillary: 185 mg/dL — ABNORMAL HIGH (ref 70–99)

## 2022-06-19 MED ORDER — INSULIN GLARGINE-YFGN 100 UNIT/ML ~~LOC~~ SOLN
40.0000 [IU] | Freq: Every day | SUBCUTANEOUS | Status: DC
  Filled 2022-06-19: qty 0.4

## 2022-06-19 MED ORDER — INSULIN GLARGINE-YFGN 100 UNIT/ML ~~LOC~~ SOLN
50.0000 [IU] | Freq: Every day | SUBCUTANEOUS | Status: DC
  Administered 2022-06-19: 50 [IU] via SUBCUTANEOUS
  Filled 2022-06-19 (×2): qty 0.5

## 2022-06-19 NOTE — Plan of Care (Signed)

## 2022-06-19 NOTE — Plan of Care (Signed)
  Problem: Education: Goal: Knowledge of General Education information will improve Description: Including pain rating scale, medication(s)/side effects and non-pharmacologic comfort measures Outcome: Progressing   Problem: Health Behavior/Discharge Planning: Goal: Ability to manage health-related needs will improve Outcome: Progressing   Problem: Clinical Measurements: Goal: Ability to maintain clinical measurements within normal limits will improve Outcome: Progressing Goal: Will remain free from infection Outcome: Progressing Goal: Diagnostic test results will improve Outcome: Progressing Goal: Respiratory complications will improve Outcome: Progressing Goal: Cardiovascular complication will be avoided Outcome: Progressing   Problem: Nutrition: Goal: Adequate nutrition will be maintained Outcome: Progressing   Problem: Coping: Goal: Level of anxiety will decrease Outcome: Progressing   Problem: Elimination: Goal: Will not experience complications related to bowel motility Outcome: Progressing Goal: Will not experience complications related to urinary retention Outcome: Progressing   Problem: Pain Managment: Goal: General experience of comfort will improve Outcome: Progressing   Problem: Safety: Goal: Ability to remain free from injury will improve Outcome: Progressing   Problem: Safety: Goal: Ability to remain free from injury will improve Outcome: Progressing   Problem: Skin Integrity: Goal: Risk for impaired skin integrity will decrease Outcome: Progressing   Problem: Education: Goal: Ability to describe self-care measures that may prevent or decrease complications (Diabetes Survival Skills Education) will improve Outcome: Progressing Goal: Individualized Educational Video(s) Outcome: Progressing   Problem: Coping: Goal: Ability to adjust to condition or change in health will improve Outcome: Progressing   Problem: Fluid Volume: Goal: Ability to  maintain a balanced intake and output will improve Outcome: Progressing   Problem: Metabolic: Goal: Ability to maintain appropriate glucose levels will improve Outcome: Progressing   Problem: Nutritional: Goal: Maintenance of adequate nutrition will improve Outcome: Progressing Goal: Progress toward achieving an optimal weight will improve Outcome: Progressing   Problem: Skin Integrity: Goal: Risk for impaired skin integrity will decrease Outcome: Progressing   Problem: Tissue Perfusion: Goal: Adequacy of tissue perfusion will improve Outcome: Progressing

## 2022-06-19 NOTE — TOC Progression Note (Signed)
Transition of Care [facility]) - Progression Note    Patient Details  Name: Jessica Donovan MRN: [id number] Date of Birth: Jul 20, 1946  Transition of Care [facility]) CM/SW Contact  Margarito Liner, LCSW Phone Number: 06/19/2022, 3:28 PM  Clinical Narrative:  Per [facility] handoff, [facility] has started [place]. Checked with admissions coordinator and she said Berkley Harvey is still pending.     Barriers to Discharge: Continued Medical Work up  Expected Discharge Plan and Services         Living arrangements for the past 2 months: Single Family Home                                       Social Determinants of Health (SDOH) Interventions    Readmission Risk Interventions    06/13/2022   10:24 AM  Readmission Risk Prevention Plan  Post Dischage Appt Complete  Medication Screening Complete  Transportation Screening Complete

## 2022-06-19 NOTE — Progress Notes (Signed)
[facility]  - [place] at [facility]   PATIENT NAME: Jessica Donovan    MR#:  [id number]  DATE OF BIRTH:  [birth year]  SUBJECTIVE:   patient overall doing well. Denies any complaints. Sugars improving. No family at bedside.   VITALS:  Blood pressure 123/62, pulse 77, temperature 98.4 F (36.9 C), temperature source Oral, resp. rate 16, height 5\' 6"  (1.676 m), weight 81.2 kg, SpO2 91 %.  PHYSICAL EXAMINATION:   GENERAL:  76 y.o.-year-old patient lying in the bed with no acute distress.  LUNGS: Normal breath sounds bilaterally, no wheezing, rales, rhonchi.  CARDIOVASCULAR: S1, S2 normal. No murmurs, rubs, or gallops.  ABDOMEN: Soft, nontender, nondistended. Bowel sounds present.  EXTREMITIES: left great toe   NEUROLOGIC: nonfocal  patient is alert and awake   LABORATORY PANEL:  CBC Recent Labs  Lab 06/16/22 0348  WBC 8.2  HGB 13.3  HCT 42.1  PLT 261     Chemistries  Recent Labs  Lab 06/16/22 0348  CREATININE 0.69    Cardiac Enzymes No results for input(s): "TROPONINI" in the last 168 hours. RADIOLOGY:  No results found.  Assessment and Plan  [name], is a 76 year old female with history of anxiety, depression, hypertension, chronic pain, insulin-dependent diabetes mellitus, GERD, hyperlipidemia, obesity, insomnia, who presents to the emergency department for chief concerns of left toe ulcer.  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 06/14/22 podiatry -- continue present pain meds -- patient will follow-up with podiatry as outpatient. Leave current dressing on as per instruction from podiatry per note 6/24 -- change to PO doxycycline-- will complete total 10 days of antibiotic --Per Dr cline " Betadine and a sterile bandage reapplied to the left foot.  Originally had scheduled the patient to come in this Friday but at this point I will follow her up in 2 weeks outpatient in the office  for suture removal.  Keep the bandage clean, dry.  Wound may be assessed at skilled nursing if needed and they could also remove the sutures after next Friday if the incision looks stable"  Hypertension -- continue present meds  type II diabetes with diabetic neuropathy -- currently on long-acting semglee and aspart with sliding scale -- A1c 7.8  history of depression -- continue Remeron  generalized anxiety disorder -- continue Effexor, Xanax, trazodone  history of substance abuse (per notes--pain meds) --cont Present pain meds   Procedures: amputation and left great toe Family communication : per patient no need for informing family Consults : podiatry CODE STATUS: full DVT Prophylaxis : enoxaparin Level of care: Med-Surg Status is: Inpatient Remains inpatient appropriate because: pt is refusing the bed offer she has and wants to try [facility] rehab place.  6/30--per TOC awaiting insurance auth from [facility]  Patient is medically best at baseline for discharge    TOTAL TIME TAKING CARE OF THIS PATIENT: 25 minutes.  >50% time spent on counselling and coordination of care  Note: This dictation was prepared with Dragon dictation along with smaller phrase technology. Any transcriptional errors that result from this process are unintentional.  [facility] M.D    [facility]   CC: Primary care physician; [name], MD

## 2022-06-19 NOTE — Progress Notes (Signed)
Physical Therapy Treatment Patient Details Name: Jessica Donovan MRN: [id number] DOB: [birth year] Today's Date: 06/19/2022   History of Present Illness Pt is a 76 year old female with history of anxiety, depression, hypertension, chronic pain, insulin-dependent diabetes mellitus, GERD, hyperlipidemia, obesity, insomnia, who presented to the ED on 06/11/22 for chief concerns of left toe ulcer. Pt underwent amputation of L great toe on 06/12/22 due to osteomyelitis.    PT Comments    Pt received in recliner with pt initially declining participation 2/2 having a bad night & pain but pt eventually agreeable to bed level exercises. Pt engaged in BLE strengthening exercises but pt perseverative on things that happened in the past (husband passing, dog & pt being injured by another dog, dogs passing away) with pt attempting to cue pt to focus on task at hand. Will continue to follow pt acutely to address endurance, strength, and gait with LRAD.    Recommendations for follow up therapy are one component of a multi-disciplinary discharge planning process, led by the attending physician.  Recommendations may be updated based on patient status, additional functional criteria and insurance authorization.  Follow Up Recommendations  Skilled nursing-short term rehab (<3 hours/day) Can patient physically be transported by private vehicle: Yes   Assistance Recommended at Discharge Intermittent Supervision/Assistance  Patient can return home with the following A little help with walking and/or transfers;A little help with bathing/dressing/bathroom;Assistance with cooking/housework;Assist for transportation;Help with stairs or ramp for entrance   Equipment Recommendations  Rolling walker (2 wheels)    Recommendations for Other Services       Precautions / Restrictions Precautions Precautions: Fall Other Brace: L post-op shoe Restrictions Weight Bearing Restrictions: Yes LLE Weight Bearing: Weight  bearing as tolerated Other Position/Activity Restrictions: WBAT through the heel only (per conversation with pt, minimize gait)     Mobility  Bed Mobility                    Transfers                        Ambulation/Gait                   Stairs             Wheelchair Mobility    Modified Rankin (Stroke Patients Only)       Balance                                            Cognition Arousal/Alertness: Awake/alert Behavior During Therapy: WFL for tasks assessed/performed Overall Cognitive Status: Within Functional Limits for tasks assessed                                          Exercises General Exercises - Lower Extremity Heel Slides: AROM, Strengthening, Both, 10 reps, Supine Hip ABduction/ADduction: AROM, Supine, Strengthening, Both, 10 reps (BLE hip abduction x 10, hip adduction pillow squeezes x 10) Straight Leg Raises: AROM, Supine, Strengthening, Both, 10 reps    General Comments        Pertinent Vitals/Pain Pain Assessment Pain Assessment: Faces Faces Pain Scale: Hurts little more Pain Location: back & L foot Pain Descriptors / Indicators: Discomfort Pain Intervention(s): Monitored during session    Home Living  Prior Function            PT Goals (current goals can now be found in the care plan section) Acute Rehab PT Goals Patient Stated Goal: get better to get back home PT Goal Formulation: With patient Time For Goal Achievement: 06/27/22 Potential to Achieve Goals: Good Progress towards PT goals: Progressing toward goals    Frequency    Min 2X/week      PT Plan Current plan remains appropriate    Co-evaluation              AM-PAC PT "6 Clicks" Mobility   Outcome Measure  Help needed turning from your back to your side while in a flat bed without using bedrails?: None Help needed moving from lying on your back to  sitting on the side of a flat bed without using bedrails?: A Little Help needed moving to and from a bed to a chair (including a wheelchair)?: A Little Help needed standing up from a chair using your arms (e.g., wheelchair or bedside chair)?: A Little Help needed to walk in hospital room?: A Little Help needed climbing 3-5 steps with a railing? : A Lot 6 Click Score: 18    End of Session   Activity Tolerance: Patient tolerated treatment well Patient left: in bed;with call bell/phone within reach;with bed alarm set   PT Visit Diagnosis: Unsteadiness on feet (R26.81);Muscle weakness (generalized) (M62.81);Difficulty in walking, not elsewhere classified (R26.2);Pain Pain - Right/Left: Left Pain - part of body: Ankle and joints of foot     Time: 1435-1451 PT Time Calculation (min) (ACUTE ONLY): 16 min  Charges:  $Therapeutic Exercise: 8-22 mins                     Aleda Grana, PT, DPT 06/19/22, 3:35 PM   Sandi Mariscal 06/19/2022, 3:33 PM

## 2022-06-20 DIAGNOSIS — M86272 Subacute osteomyelitis, left ankle and foot: Secondary | ICD-10-CM | POA: Diagnosis not present

## 2022-06-20 LAB — GLUCOSE, CAPILLARY
Glucose-Capillary: 169 mg/dL — ABNORMAL HIGH (ref 70–99)
Glucose-Capillary: 290 mg/dL — ABNORMAL HIGH (ref 70–99)
Glucose-Capillary: 184 mg/dL — ABNORMAL HIGH (ref 70–99)
Glucose-Capillary: 239 mg/dL — ABNORMAL HIGH (ref 70–99)

## 2022-06-20 MED ORDER — INSULIN GLARGINE-YFGN 100 UNIT/ML ~~LOC~~ SOLN
60.0000 [IU] | Freq: Every day | SUBCUTANEOUS | Status: DC
  Administered 2022-06-20: 60 [IU] via SUBCUTANEOUS
  Filled 2022-06-20 (×2): qty 0.6

## 2022-06-20 NOTE — Progress Notes (Signed)
[facility]  - [place] at [facility]   PATIENT NAME: Jessica Donovan    MR#:  [id number]  DATE OF BIRTH:  14-Feb-1946  SUBJECTIVE:   patient overall doing well. Denies any complaints. Sugars improving. No family at bedside.   VITALS:  Blood pressure (!) 137/57, pulse 73, temperature 97.8 F (36.6 C), resp. rate 16, height 5\' 6"  (1.676 m), weight 81.2 kg, SpO2 92 %.  PHYSICAL EXAMINATION:   GENERAL:  76 y.o.-year-old patient lying in the bed with no acute distress.  LUNGS: Normal breath sounds bilaterally, no wheezing, rales, rhonchi.  CARDIOVASCULAR: S1, S2 normal. No murmurs, rubs, or gallops.  ABDOMEN: Soft, nontender, nondistended. Bowel sounds present.  EXTREMITIES: left great toe   NEUROLOGIC: nonfocal  patient is alert and awake   LABORATORY PANEL:  CBC Recent Labs  Lab 06/16/22 0348  WBC 8.2  HGB 13.3  HCT 42.1  PLT 261     Chemistries  Recent Labs  Lab 06/16/22 0348  CREATININE 0.69    Cardiac Enzymes No results for input(s): "TROPONINI" in the last 168 hours. RADIOLOGY:  No results found.  Assessment and Plan  [name], is a 75 year old female with history of anxiety, depression, hypertension, chronic pain, insulin-dependent diabetes mellitus, GERD, hyperlipidemia, obesity, insomnia, who presents to the emergency department for chief concerns of left toe ulcer.  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 06/14/22 podiatry -- continue present pain meds -- patient will follow-up with podiatry as outpatient. Leave current dressing on as per instruction from podiatry per note 6/24 -- change to PO doxycycline-- will complete total 10 days of antibiotic --Per Dr cline " Betadine and a sterile bandage reapplied to the left foot.  Originally had scheduled the patient to come in this Friday but at this point I will follow her up in 2 weeks outpatient in the office for suture removal.   Keep the bandage clean, dry.  Wound may be assessed at skilled nursing if needed and they could also remove the sutures after next Friday if the incision looks stable"  Hypertension -- continue present meds  type II diabetes with diabetic neuropathy -- currently on long-acting semglee and aspart with sliding scale -- A1c 7.8  history of depression -- continue Remeron  generalized anxiety disorder -- continue Effexor, Xanax, trazodone  history of substance abuse (per notes--pain meds) --cont Present pain meds   Procedures: amputation and left great toe Family communication : per patient no need for informing family Consults : podiatry CODE STATUS: full DVT Prophylaxis : enoxaparin Level of care: Med-Surg Status is: Inpatient Remains inpatient appropriate because: per [facility] awaiting insurance auth from [facility]  Patient is medically best at baseline for discharge    TOTAL TIME TAKING CARE OF THIS PATIENT: 25 minutes.  >50% time spent on counselling and coordination of care  Note: This dictation was prepared with Dragon dictation along with smaller phrase technology. Any transcriptional errors that result from this process are unintentional.  [facility] M.D    [facility]   CC: Primary care physician; [name], MD

## 2022-06-20 NOTE — Plan of Care (Signed)

## 2022-06-21 DIAGNOSIS — M86272 Subacute osteomyelitis, left ankle and foot: Secondary | ICD-10-CM | POA: Diagnosis not present

## 2022-06-21 LAB — GLUCOSE, CAPILLARY
Glucose-Capillary: 222 mg/dL — ABNORMAL HIGH (ref 70–99)
Glucose-Capillary: 242 mg/dL — ABNORMAL HIGH (ref 70–99)
Glucose-Capillary: 234 mg/dL — ABNORMAL HIGH (ref 70–99)
Glucose-Capillary: 220 mg/dL — ABNORMAL HIGH (ref 70–99)

## 2022-06-21 MED ORDER — INSULIN GLARGINE-YFGN 100 UNIT/ML ~~LOC~~ SOLN
65.0000 [IU] | Freq: Every day | SUBCUTANEOUS | Status: DC
  Administered 2022-06-21 – 2022-06-24 (×4): 65 [IU] via SUBCUTANEOUS
  Filled 2022-06-21 (×4): qty 0.65

## 2022-06-21 NOTE — Progress Notes (Signed)
[facility]  - [place] at [facility]   PATIENT NAME: Jessica Donovan    MR#:  [id number]  DATE OF BIRTH:  09-27-1946  SUBJECTIVE:   patient overall doing well. Denies any complaints. Sugars fluctuating   VITALS:  Blood pressure (!) 146/58, pulse 73, temperature 98.2 F (36.8 C), temperature source Oral, resp. rate 16, height 5\' 6"  (1.676 m), weight 81.2 kg, SpO2 90 %.  PHYSICAL EXAMINATION:   GENERAL:  76 y.o.-year-old patient lying in the bed with no acute distress.  LUNGS: Normal breath sounds bilaterally CARDIOVASCULAR: S1, S2 normal. No murmurs, rubs, or gallops.  ABDOMEN: Soft, nontender, nondistended. Bowel sounds present.  EXTREMITIES: left great toe   NEUROLOGIC: nonfocal  patient is alert and awake   LABORATORY PANEL:  CBC Recent Labs  Lab 06/16/22 0348  WBC 8.2  HGB 13.3  HCT 42.1  PLT 261     Chemistries  Recent Labs  Lab 06/16/22 0348  CREATININE 0.69    Cardiac Enzymes No results for input(s): "TROPONINI" in the last 168 hours. RADIOLOGY:  No results found.  Assessment and Plan  [name], is a 76 year old female with history of anxiety, depression, hypertension, chronic pain, insulin-dependent diabetes mellitus, GERD, hyperlipidemia, obesity, insomnia, who presents to the emergency department for chief concerns of left toe ulcer.  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 06/14/22 podiatry -- continue present pain meds -- patient will follow-up with podiatry as outpatient. Leave current dressing on as per instruction from podiatry per note 6/24 -- change to PO doxycycline-- will complete total 10 days of antibiotic --Per Dr cline " Betadine and a sterile bandage reapplied to the left foot.  Originally had scheduled the patient to come in this Friday but at this point I will follow her up in 2 weeks outpatient in the office for suture removal.  Keep the bandage clean, dry.   Wound may be assessed at skilled nursing if needed and they could also remove the sutures after next Friday if the incision looks stable"  Hypertension -- continue present meds  type II diabetes with diabetic neuropathy -- currently on long-acting semglee and aspart with sliding scale -- A1c 7.8  history of depression -- continue Remeron  generalized anxiety disorder -- continue Effexor, Xanax, trazodone  history of substance abuse (per notes--pain meds) --cont Present pain meds   Procedures: amputation and left great toe Family communication : per patient no need for informing family Consults : podiatry CODE STATUS: full DVT Prophylaxis : enoxaparin Level of care: Med-Surg Status is: Inpatient Remains inpatient appropriate because: per [facility] awaiting insurance auth from [facility]  Patient is medically best at baseline for discharge    TOTAL TIME TAKING CARE OF THIS PATIENT: 25 minutes.  >50% time spent on counselling and coordination of care  Note: This dictation was prepared with Dragon dictation along with smaller phrase technology. Any transcriptional errors that result from this process are unintentional.  [facility] M.D    [facility]   CC: Primary care physician; Jessica Finner, MD

## 2022-06-21 NOTE — Plan of Care (Signed)

## 2022-06-21 NOTE — TOC Progression Note (Signed)
Transition of Care [facility]) - Progression Note    Patient Details  Name: Tonnya Garbett MRN: [id number] Date of Birth: Apr 02, 1946  Transition of Care [facility]) CM/SW Contact  Liliana Cline, LCSW Phone Number: 06/21/2022, 11:50 AM  Clinical Narrative:   Reached out to Gavin Pound at [facility] to inquire about insurance auth and to inform her patient is medically ready to DC to [facility] and inquired if they have a bed tomorrow if Berkley Harvey is approved.  Awaiting response.       Barriers to Discharge: Continued Medical Work up  Expected Discharge Plan and Services         Living arrangements for the past 2 months: Single Family Home                                       Social Determinants of Health (SDOH) Interventions    Readmission Risk Interventions    06/13/2022   10:24 AM  Readmission Risk Prevention Plan  Post Dischage Appt Complete  Medication Screening Complete  Transportation Screening Complete

## 2022-06-22 DIAGNOSIS — M86272 Subacute osteomyelitis, left ankle and foot: Secondary | ICD-10-CM | POA: Diagnosis not present

## 2022-06-22 LAB — GLUCOSE, CAPILLARY
Glucose-Capillary: 194 mg/dL — ABNORMAL HIGH (ref 70–99)
Glucose-Capillary: 254 mg/dL — ABNORMAL HIGH (ref 70–99)
Glucose-Capillary: 99 mg/dL (ref 70–99)
Glucose-Capillary: 231 mg/dL — ABNORMAL HIGH (ref 70–99)

## 2022-06-22 MED ORDER — INSULIN ASPART 100 UNIT/ML IJ SOLN
5.0000 [IU] | Freq: Three times a day (TID) | INTRAMUSCULAR | Status: DC
  Administered 2022-06-22 – 2022-06-24 (×6): 5 [IU] via SUBCUTANEOUS
  Filled 2022-06-22 (×6): qty 1

## 2022-06-22 NOTE — Progress Notes (Signed)
[facility]  - [place] at [facility]   PATIENT NAME: Jessica Donovan    MR#:  [id number]  DATE OF BIRTH:  [birth year]  SUBJECTIVE:   patient overall doing well. Denies any complaints. Sugars fluctuating   VITALS:  Blood pressure (!) 141/50, pulse 74, temperature 98.2 F (36.8 C), temperature source Oral, resp. rate 16, height 5\' 6"  (1.676 m), weight 81.2 kg, SpO2 94 %.  PHYSICAL EXAMINATION:   GENERAL:  76 y.o.-year-old patient lying in the bed with no acute distress.  LUNGS: Normal breath sounds bilaterally CARDIOVASCULAR: S1, S2 normal. No murmurs, rubs, or gallops.  ABDOMEN: Soft, nontender, nondistended. Bowel sounds present.  EXTREMITIES: left great toe   NEUROLOGIC: nonfocal  patient is alert and awake   LABORATORY PANEL:  CBC Recent Labs  Lab 06/16/22 0348  WBC 8.2  HGB 13.3  HCT 42.1  PLT 261     Chemistries  Recent Labs  Lab 06/16/22 0348  CREATININE 0.69    Cardiac Enzymes No results for input(s): "TROPONINI" in the last 168 hours. RADIOLOGY:  No results found.  Assessment and Plan  Ms. Esly Selvage, is a 76 year old female with history of anxiety, depression, hypertension, chronic pain, insulin-dependent diabetes mellitus, GERD, hyperlipidemia, obesity, insomnia, who presents to the emergency department for chief concerns of left toe ulcer.  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 06/14/22 podiatry -- continue present pain meds -- patient will follow-up with podiatry as outpatient. Leave current dressing on as per instruction from podiatry per note 6/24 -- change to PO doxycycline-- will complete total 10 days of antibiotic --Per Dr cline " Betadine and a sterile bandage reapplied to the left foot.  Originally had scheduled the patient to come in this Friday but at this point I will follow her up in 2 weeks outpatient in the office for suture removal.  Keep the bandage clean, dry.   Wound may be assessed at skilled nursing if needed and they could also remove the sutures after next Friday if the incision looks stable"  Hypertension -- continue present meds  type II diabetes with diabetic neuropathy -- currently on long-acting semglee and aspart with sliding scale -- A1c 7.8  history of depression -- continue Remeron  generalized anxiety disorder -- continue Effexor, Xanax, trazodone  history of substance abuse (per notes--pain meds) --cont Present pain meds   Procedures: amputation and left great toe Family communication : per patient no need for informing family Consults : podiatry CODE STATUS: full DVT Prophylaxis : enoxaparin Level of care: Med-Surg Status is: Inpatient Remains inpatient appropriate because: per [facility] awaiting insurance auth from [facility]  Patient is medically best at baseline for discharge.  Barrier to [facility] pending.     TOTAL TIME TAKING CARE OF THIS PATIENT: 25 minutes.  >50% time spent on counselling and coordination of care  Note: This dictation was prepared with Dragon dictation along with smaller phrase technology. Any transcriptional errors that result from this process are unintentional.  Medical illustrator M.D    [facility]   CC: Primary care physician; [name], MD

## 2022-06-22 NOTE — TOC Progression Note (Addendum)
Transition of Care [facility]) - Progression Note    Patient Details  Name: Jessica Donovan MRN: [id number] Date of Birth: 1945/12/29  Transition of Care [facility]) CM/SW Contact  Margarito Liner, LCSW Phone Number: 06/22/2022, 9:53 AM  Clinical Narrative:  Berkley Harvey still pending. [facility] admissions coordinator left message at [facility] requesting an update.   12:54 pm: Updated patient.  3:36 pm: Insurance is requesting a peer-to-peer review and will call MD.    Barriers to Discharge: Continued Medical Work up  Expected Discharge Plan and Services         Living arrangements for the past 2 months: Single Family Home                                       Social Determinants of Health (SDOH) Interventions    Readmission Risk Interventions    06/13/2022   10:24 AM  Readmission Risk Prevention Plan  Post Dischage Appt Complete  Medication Screening Complete  Transportation Screening Complete

## 2022-06-22 NOTE — Progress Notes (Signed)
Physical Therapy Treatment Patient Details Name: Jessica Donovan MRN: [id number] DOB: December 29, 1945 Today's Date: 06/22/2022   History of Present Illness Pt is a 76 year old female with history of anxiety, depression, hypertension, chronic pain, insulin-dependent diabetes mellitus, GERD, hyperlipidemia, obesity, insomnia, who presented to the ED on 06/11/22 for chief concerns of left toe ulcer. Pt underwent amputation of L great toe on 06/12/22 due to osteomyelitis.    PT Comments    Pt seen for PT tx with pt agreeable. Pt completes all transfers & gait with RW with supervision with educational cuing for weight bearing through heel only. Pt does experience posterior lean/LOB during STS & turning during gait but pt able to correct without assistance. Pt demonstrates kyphotic posture but reports this is baseline. Pt engaged in LLE strengthening exercises with increased repetitions with pt noting muscle fatigue at end.     Recommendations for follow up therapy are one component of a multi-disciplinary discharge planning process, led by the attending physician.  Recommendations may be updated based on patient status, additional functional criteria and insurance authorization.  Follow Up Recommendations  Skilled nursing-short term rehab (<3 hours/day) Can patient physically be transported by private vehicle: Yes   Assistance Recommended at Discharge Intermittent Supervision/Assistance  Patient can return home with the following A little help with walking and/or transfers;A little help with bathing/dressing/bathroom;Assistance with cooking/housework;Assist for transportation;Help with stairs or ramp for entrance   Equipment Recommendations  Rolling walker (2 wheels)    Recommendations for Other Services       Precautions / Restrictions Precautions Precautions: Fall Other Brace: L post-op shoe Restrictions Weight Bearing Restrictions: Yes LLE Weight Bearing: Weight bearing as  tolerated Other Position/Activity Restrictions: WBAT through the heel only (per conversation with pt, minimize gait)     Mobility  Bed Mobility Overal bed mobility: Needs Assistance Bed Mobility: Supine to Sit     Supine to sit: Modified independent (Device/Increase time), HOB elevated          Transfers Overall transfer level: Needs assistance Equipment used: Rolling walker (2 wheels) Transfers: Sit to/from Stand Sit to Stand: Supervision           General transfer comment: pt with posterior lean onto bed, extra time to come to upright standing    Ambulation/Gait Ambulation/Gait assistance: Supervision Gait Distance (Feet): 25 Feet Assistive device: Rolling walker (2 wheels) Gait Pattern/deviations: Decreased step length - right, Decreased step length - left, Decreased stride length Gait velocity: decreased     General Gait Details: educational cuing to weight bear through L heel as much as possible, 1 posterior LOB when turning but pt able to correct without assistance.   Stairs             Wheelchair Mobility    Modified Rankin (Stroke Patients Only)       Balance Overall balance assessment: Needs assistance Sitting-balance support: Feet supported, Bilateral upper extremity supported Sitting balance-Leahy Scale: Good     Standing balance support: Bilateral upper extremity supported, During functional activity Standing balance-Leahy Scale: Fair                              Cognition Arousal/Alertness: Awake/alert Behavior During Therapy: WFL for tasks assessed/performed Overall Cognitive Status: Within Functional Limits for tasks assessed  General Comments: A&Ox4        Exercises General Exercises - Lower Extremity Long Arc Quad: AROM, Strengthening, Both, 20 reps, Seated Hip ABduction/ADduction: AROM, Strengthening, Both, 20 reps, Seated (hip adduction pillow squeezes with 3  second hold) Hip Flexion/Marching: AROM, Strengthening, Both, 20 reps, Seated    General Comments        Pertinent Vitals/Pain Pain Assessment Pain Assessment: 0-10 Pain Score: 8  Pain Location: chronic pain in L neck/shoulder & back, pain in L foot Pain Descriptors / Indicators: Discomfort Pain Intervention(s): Monitored during session    Home Living                          Prior Function            PT Goals (current goals can now be found in the care plan section) Acute Rehab PT Goals Patient Stated Goal: get better to get back home PT Goal Formulation: With patient Time For Goal Achievement: 06/27/22 Potential to Achieve Goals: Good Progress towards PT goals: Progressing toward goals    Frequency    Min 2X/week      PT Plan Current plan remains appropriate    Co-evaluation              AM-PAC PT "6 Clicks" Mobility   Outcome Measure  Help needed turning from your back to your side while in a flat bed without using bedrails?: None Help needed moving from lying on your back to sitting on the side of a flat bed without using bedrails?: None Help needed moving to and from a bed to a chair (including a wheelchair)?: A Little Help needed standing up from a chair using your arms (e.g., wheelchair or bedside chair)?: A Little Help needed to walk in hospital room?: A Little Help needed climbing 3-5 steps with a railing? : A Little 6 Click Score: 20    End of Session Equipment Utilized During Treatment:  (post op shoe) Activity Tolerance: Patient tolerated treatment well Patient left: in bed;with bed alarm set;with call bell/phone within reach Nurse Communication: Mobility status PT Visit Diagnosis: Unsteadiness on feet (R26.81);Muscle weakness (generalized) (M62.81);Difficulty in walking, not elsewhere classified (R26.2);Pain Pain - Right/Left: Left Pain - part of body: Ankle and joints of foot     Time: 1131-1144 PT Time Calculation (min)  (ACUTE ONLY): 13 min  Charges:  $Therapeutic Activity: 8-22 mins                     Aleda Grana, PT, DPT 06/22/22, 12:02 PM  Sandi Mariscal 06/22/2022, 12:01 PM

## 2022-06-23 ENCOUNTER — Inpatient Hospital Stay: Payer: No Typology Code available for payment source

## 2022-06-23 DIAGNOSIS — M86272 Subacute osteomyelitis, left ankle and foot: Secondary | ICD-10-CM | POA: Diagnosis not present

## 2022-06-23 DIAGNOSIS — R6884 Jaw pain: Secondary | ICD-10-CM | POA: Diagnosis not present

## 2022-06-23 LAB — GLUCOSE, CAPILLARY
Glucose-Capillary: 128 mg/dL — ABNORMAL HIGH (ref 70–99)
Glucose-Capillary: 300 mg/dL — ABNORMAL HIGH (ref 70–99)
Glucose-Capillary: 120 mg/dL — ABNORMAL HIGH (ref 70–99)
Glucose-Capillary: 173 mg/dL — ABNORMAL HIGH (ref 70–99)

## 2022-06-23 NOTE — TOC Progression Note (Addendum)
Transition of Care [facility]) - Progression Note    Patient Details  Name: Jessica Donovan MRN: [id number] Date of Birth: 1946/06/27  Transition of Care [facility]) CM/SW Contact  Margarito Liner, LCSW Phone Number: 06/23/2022, 8:39 AM  Clinical Narrative:   Per CMA, [facility] called last night at 7:10 stating that insurance authorization had been approved. Left message for SNF admissions coordinator asking her to confirm.  2:54 pm: SNF unable to verify the auth approved is for rehab vs the hospital based on fax they received from the insurance company. Unable to reach anyone at [facility] since they are closed for the holiday.    Barriers to Discharge: Continued Medical Work up  Expected Discharge Plan and Services         Living arrangements for the past 2 months: Single Family Home                                       Social Determinants of Health (SDOH) Interventions    Readmission Risk Interventions    06/13/2022   10:24 AM  Readmission Risk Prevention Plan  Post Dischage Appt Complete  Medication Screening Complete  Transportation Screening Complete

## 2022-06-23 NOTE — Progress Notes (Signed)
Nurse presents at patients bedside for night vitals signs, assessment and med pass, later than communicated to the patient.  Nurse apologized to the patient and explained to the patient that there was a new patient admission that prevented an earlier presence.  Patient was complaining about pain and started to take a sip of her drink, nurse interrupted the patient and informed the patient to hold off on drinking as her temperature needed to be taken.  Nurse removed the temperature probe from the Dinamap and inadvertently inserted the probe into the patient's mouth without a probe cover.  Patient noticed this mistake right away and informed the nurse that the thermometer does not have a cover.  The nurse apologized profusely and reassured the patient that this machine (Dinamap) just came from the equipment room and it was cleaned with hospital wipes, even the metal part of the probe.  Nurse then obtained the patient's temperature with the probe cover on.  A few moments later the patient expressed that she was concerned that this machine was used in other patients rooms. The nurse reiterated to the patient again that the machine was cleaned and used on her first. Patient then states that she recalled that the nurse had to go in other patients' rooms and doubts the nurse used the machine on her first.  Nurse stated in calm and professional tone that the patient was the first patient this machine was used on and it was cleaned very well.  Nurse even reassured the patient of in the cleanliness of the unit and of hand hygiene measures the unit has take to decrease hospital acquired infections.  Patient then inquired if patients close by where on any isolation precautions.  The nurse communicated with the patient that she was unable to share that information with her.  Nurse also informed the patient that she understands, apologized again, and expressed empathy.  Nurse stated that she has kids and takes cleanliness  serious because she does not want to take any germs home.    Nurse communicated to the patient that the hospital floors are dirty as well and that she takes her shoes off and places them in a box in her trunk prior to getting in her car so germs are not spread in her home.  Nurse then expresses concern for the patient in relation to the patient not wanting to take off her post-op shoe while in the bed and educated the patient as the patient was worried about germs to decrease her chances of contracting an infection; due to the patient expressing earlier that she walked outside of the room with the post-op shoe on, and ambulated in the room,to and from the bathroom the last few nights.    Nurse expressed another apology, confirmed the patient did not have any additional needs, used hand sanitizer, and exited the patient's room.

## 2022-06-23 NOTE — Plan of Care (Signed)

## 2022-06-23 NOTE — Progress Notes (Signed)
[facility]  - [place] at [facility]   PATIENT NAME: Jessica Donovan    MR#:  [id number]  DATE OF BIRTH:  1946-10-15  SUBJECTIVE:   patient overall doing well. Denies any complaints. Sugars fluctuating   VITALS:  Blood pressure 127/68, pulse 73, temperature 98.4 F (36.9 C), resp. rate 16, height 5\' 6"  (1.676 m), weight 81.2 kg, SpO2 93 %.  PHYSICAL EXAMINATION:   GENERAL:  76 y.o.-year-old patient lying in the bed with no acute distress.  LUNGS: Normal breath sounds bilaterally CARDIOVASCULAR: S1, S2 normal. No murmurs, rubs, or gallops.  ABDOMEN: Soft, nontender, nondistended. Bowel sounds present.  EXTREMITIES: left great toe   NEUROLOGIC: nonfocal  patient is alert and awake   LABORATORY PANEL:  CBC No results for input(s): "WBC", "HGB", "HCT", "PLT" in the last 168 hours.   Chemistries  No results for input(s): "NA", "K", "CL", "CO2", "GLUCOSE", "BUN", "CREATININE", "CALCIUM", "MG", "AST", "ALT", "ALKPHOS", "BILITOT" in the last 168 hours.  Invalid input(s): "GFRCGP"  Cardiac Enzymes No results for input(s): "TROPONINI" in the last 168 hours. RADIOLOGY:  No results found.  Assessment and Plan  Jessica Donovan, is a 76 year old female with history of anxiety, depression, hypertension, chronic pain, insulin-dependent diabetes mellitus, GERD, hyperlipidemia, obesity, insomnia, who presents to the emergency department for chief concerns of left toe ulcer.  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 06/14/22 podiatry -- continue present pain meds -- patient will follow-up with podiatry as outpatient. Leave current dressing on as per instruction from podiatry per note 6/24 -- change to PO doxycycline-- will complete total 10 days of antibiotic --Per Dr cline " Betadine and a sterile bandage reapplied to the left foot.  Originally had scheduled the patient to come in this Friday but at this point I will  follow her up in 2 weeks outpatient in the office for suture removal.  Keep the bandage clean, dry.  Wound may be assessed at skilled nursing if needed and they could also remove the sutures after next Friday if the incision looks stable"  Hypertension -- continue present meds  type II diabetes with diabetic neuropathy -- currently on long-acting semglee and aspart with sliding scale -- A1c 7.8 --please d/c on LANTUS current dose to rehab (not [facility]--per rehab request)  history of depression -- continue Remeron  generalized anxiety disorder -- continue Effexor, Xanax, trazodone  history of substance abuse (per notes--pain meds) --cont Present pain meds   Procedures: amputation and left great toe Family communication : per patient no need for informing family Consults : podiatry CODE STATUS: full DVT Prophylaxis : enoxaparin Level of care: Med-Surg Status is: Inpatient Remains inpatient appropriate because:  Patient is medically best at baseline for discharge.  Barrier to Monday pending.     TOTAL TIME TAKING CARE OF THIS PATIENT: 25 minutes.  >50% time spent on counselling and coordination of care  Note: This dictation was prepared with Dragon dictation along with smaller phrase technology. Any transcriptional errors that result from this process are unintentional.  Medical illustrator M.D    [facility]   CC: Primary care physician; [name], MD

## 2022-06-24 DIAGNOSIS — F0390 Unspecified dementia without behavioral disturbance: Secondary | ICD-10-CM | POA: Diagnosis not present

## 2022-06-24 DIAGNOSIS — M86272 Subacute osteomyelitis, left ankle and foot: Secondary | ICD-10-CM | POA: Diagnosis not present

## 2022-06-24 DIAGNOSIS — Z89412 Acquired absence of left great toe: Secondary | ICD-10-CM | POA: Diagnosis not present

## 2022-06-24 DIAGNOSIS — M6281 Muscle weakness (generalized): Secondary | ICD-10-CM | POA: Diagnosis not present

## 2022-06-24 DIAGNOSIS — E1140 Type 2 diabetes mellitus with diabetic neuropathy, unspecified: Secondary | ICD-10-CM | POA: Diagnosis not present

## 2022-06-24 DIAGNOSIS — M86172 Other acute osteomyelitis, left ankle and foot: Secondary | ICD-10-CM | POA: Diagnosis not present

## 2022-06-24 DIAGNOSIS — Z4781 Encounter for orthopedic aftercare following surgical amputation: Secondary | ICD-10-CM | POA: Diagnosis not present

## 2022-06-24 LAB — GLUCOSE, CAPILLARY
Glucose-Capillary: 141 mg/dL — ABNORMAL HIGH (ref 70–99)
Glucose-Capillary: 178 mg/dL — ABNORMAL HIGH (ref 70–99)
Glucose-Capillary: 233 mg/dL — ABNORMAL HIGH (ref 70–99)

## 2022-06-24 MED ORDER — MORPHINE SULFATE 15 MG PO TABS: 15.0000 mg | ORAL_TABLET | Freq: Two times a day (BID) | ORAL | 0 refills | Status: AC | PRN

## 2022-06-24 MED ORDER — CELECOXIB 100 MG PO CAPS: 100.0000 mg | ORAL_CAPSULE | Freq: Two times a day (BID) | ORAL | Status: DC | PRN

## 2022-06-24 MED ORDER — OMEPRAZOLE 40 MG PO CPDR: 40.0000 mg | DELAYED_RELEASE_CAPSULE | Freq: Every day | ORAL | Status: DC

## 2022-06-24 MED ORDER — ALPRAZOLAM 0.5 MG PO TABS: 0.5000 mg | ORAL_TABLET | Freq: Two times a day (BID) | ORAL | 0 refills | Status: DC | PRN

## 2022-06-24 MED ORDER — ALPRAZOLAM 0.5 MG PO TABS: 0.5000 mg | ORAL_TABLET | Freq: Four times a day (QID) | ORAL | 0 refills | Status: AC | PRN

## 2022-06-24 NOTE — Discharge Summary (Signed)
Physician Discharge Summary   Jessica Donovan  female DOB: March 03, 1946  ZOX:[id number]  PCP: Mick SellFitzgerald, David P, MD  Admit date: 06/11/2022 Discharge date: 06/24/2022  Admitted From: home Disposition:  SNF CODE STATUS: Full code  Discharge Instructions     Diet Carb Modified   Complete by: As directed    No wound care   Complete by: As directed       Hospital Course:  For full details, please see H&P, progress notes, consult notes and ancillary notes.  Briefly,  [name], is a 76 year old female with history of anxiety, depression, hypertension, chronic pain, insulin-dependent diabetes mellitus, who presented to the emergency department for chief concerns of left toe ulcer.   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 Alberteen Spindleline podiatry --received 6 days of vanc and cefepime f/b 3 days of doxycycline, last dose on 6/30. -- patient will follow-up with podiatry as outpatient.   Keep the bandage clean, dry.  Leave current dressing on until f/u.  Hypertension --cont home regimen as below.   type II diabetes with diabetic neuropathy --on home TOUJEO 60u daily and Insulin aspart (novolog) as SSI.   -- A1c 7.8.   --discharged on home regimen since A1c is acceptable.  Facility may give equivalent long-acting insulin (Lantus instead of TOUJEO)   history of depression and anxiety --cont home regimen as below --pt does have a current Rx for Xanax 0.5 mg 4 times daily PRN.  Rx provided for 7 days at discharge, to be continued by SNF provider.   Chronic pain on chronic opioids --Pt does have current Rx for home morphine 15 mg BID PRN.  Rx provided for 7 days at discharge, to be continued by SNF provider.    Unless noted above, medications under "STOP" list are ones pt was not taking PTA.  Discharge Diagnoses:  Principal Problem:   Osteomyelitis (HCC) Active Problems:   HTN (hypertension)   Dementia (HCC)    Chronic pain syndrome   Generalized anxiety disorder   Moderate recurrent major depression (HCC)   Type 2 diabetes mellitus with diabetic neuropathy (HCC)   At risk for polypharmacy   30 Day Unplanned Readmission Risk Score    Flowsheet Row ED to Hosp-Admission (Current) [facility] (1A)  30 Day Unplanned Readmission Risk Score (%) 12.6 Filed at 06/24/2022 0801       This score is the patient's risk of an unplanned readmission within 30 days of being discharged (0 -100%). The score is based on dignosis, age, lab data, medications, orders, and past utilization.   Low:  0-14.9   Medium: 15-21.9   High: 22-29.9   Extreme: 30 and above         Discharge Instructions:  Allergies as of 06/24/2022       Reactions   Amoxicillin Diarrhea   Ampicillin Nausea And Vomiting, Other (See Comments)   Did it involve swelling of the face/tongue/throat, SOB, or low BP? Yes Did it involve sudden or severe rash/hives, skin peeling, or any reaction on the inside of your mouth or nose? No Did you need to seek medical attention at a hospital or doctor's office? No When did it last happen? >10 years If all above answers are "NO", may proceed with cephalosporin use.   Augmentin [amoxicillin-pot Clavulanate] Diarrhea   Bactrim [sulfamethoxazole-trimethoprim] Other (See Comments)   N/V/D   Codeine Nausea And Vomiting   Eggs Or  Egg-derived Products    ALLergic to cook or raw egg only. Food with egg ingredient is okay for patient.    Influenza Vaccines Other (See Comments)   Reaction:  Caused pt to pass out    Influenza Virus Vaccine Other (See Comments)   Reaction: Passed out for 12 days   Methadone Hives, Itching   Oxycodone-acetaminophen Nausea And Vomiting   Percocet [oxycodone-acetaminophen] Nausea And Vomiting   Sulfa Antibiotics Other (See Comments)   Reaction: unknown   Tetanus Antitoxin Swelling, Other (See Comments)   Reaction: injection  site swelling   Tetanus Toxoids Swelling, Other (See Comments)   Reaction:  Swelling at injection site   Penicillin G Rash   Has patient had a PCN reaction causing immediate rash, facial/tongue/throat swelling, SOB or lightheadedness with hypotension: Yes Has patient had a PCN reaction causing severe rash involving mucus membranes or skin necrosis: No Has patient had a PCN reaction that required hospitalization: No Has patient had a PCN reaction occurring within the last 10 years: No If all of the above answers are "NO", then may proceed with Cephalosporin use..   Tetracyclines & Related Rash        Medication List     STOP taking these medications    esomeprazole 20 MG capsule Commonly known as: NEXIUM   ferrous sulfate 325 (65 FE) MG tablet   HumaLOG KwikPen 100 UNIT/ML KwikPen Generic drug: insulin lispro   pantoprazole 40 MG tablet Commonly known as: PROTONIX       TAKE these medications    acetaminophen 500 MG tablet Commonly known as: TYLENOL Take 1,000 mg by mouth every 8 (eight) hours as needed for mild pain or headache.   ALPRAZolam 0.5 MG tablet Commonly known as: XANAX Take 1 tablet (0.5 mg total) by mouth 4 (four) times daily as needed for up to 7 days for anxiety (Home med). What changed:  when to take this reasons to take this Another medication with the same name was removed. Continue taking this medication, and follow the directions you see here.   AMBULATORY NON FORMULARY MEDICATION Compounded estrogen  Apply 1 gram vaginally M,W, [facility]   aspirin 81 MG tablet Take 81 mg by mouth daily.   atorvastatin 20 MG tablet Commonly known as: LIPITOR Take 20 mg by mouth daily.   celecoxib 100 MG capsule Commonly known as: CELEBREX Take 1 capsule (100 mg total) by mouth 2 (two) times daily as needed (home med). What changed:  medication strength when to take this reasons to take this   cyclobenzaprine 10 MG tablet Commonly  known as: FLEXERIL Take 10 mg by mouth 3 (three) times daily as needed.   HAIR SKIN AND NAILS FORMULA PO Take by mouth daily. 2 gummies daily   insulin aspart 100 UNIT/ML injection Commonly known as: novoLOG Inject 3-15 Units into the skin 3 (three) times daily with meals as needed for high blood sugar. Pt uses as needed per sliding scale:    Less than 140:  0 units  140-180:  3 units 181-220:  4 units 221- 260:  6 units 261- 320:  8 units 321-360:  10 units 361-400:  12 units Greater than 400:  15 units   isosorbide mononitrate 30 MG 24 hr tablet Commonly known as: IMDUR Take 30 mg by mouth daily.   lisinopril 20 MG tablet Commonly known as: ZESTRIL Take 20 mg by mouth daily.   metoprolol tartrate 25 MG tablet Commonly known as: LOPRESSOR Take  1 tablet (25 mg total) by mouth 2 (two) times daily.   mirtazapine 15 MG tablet Commonly known as: REMERON Take 15 mg by mouth at bedtime.   morphine 15 MG tablet Commonly known as: MSIR Take 1 tablet (15 mg total) by mouth 2 (two) times daily as needed for up to 7 days for severe pain or moderate pain (home med). What changed:  when to take this reasons to take this   nitrofurantoin (macrocrystal-monohydrate) 100 MG capsule Commonly known as: MACROBID Take 100 mg by mouth daily.   omeprazole 40 MG capsule Commonly known as: PRILOSEC Take 1 capsule (40 mg total) by mouth daily. What changed:  medication strength how much to take   pregabalin 100 MG capsule Commonly known as: LYRICA Take 100 mg by mouth 3 (three) times daily.   Toujeo SoloStar 300 UNIT/ML Solostar Pen Generic drug: insulin glargine (1 Unit Dial) Inject 60 Units into the skin every morning.   traZODone 100 MG tablet Commonly known as: DESYREL Take 100 mg by mouth at bedtime.   venlafaxine XR 75 MG 24 hr capsule Commonly known as: EFFEXOR-XR Take 75 mg by mouth 2 (two) times daily.   Vitamin D (Ergocalciferol) 1.25 MG (50000 UNIT) Caps  capsule Commonly known as: DRISDOL Take 50,000 Units by mouth every Sunday.         Contact information for follow-up providers     Mick Sell, MD Follow up.   Specialty: Infectious Diseases Contact information: [address] [place] [address] [phone]              Contact information for after-discharge care     Destination     [facility] .   Service: Skilled Nursing Contact information: [address] [place] [address] [phone]                     Allergies  Allergen Reactions   Amoxicillin Diarrhea   Ampicillin Nausea And Vomiting and Other (See Comments)    Did it involve swelling of the face/tongue/throat, SOB, or low BP? Yes Did it involve sudden or severe rash/hives, skin peeling, or any reaction on the inside of your mouth or nose? No Did you need to seek medical attention at a hospital or doctor's office? No When did it last happen? >10 years If all above answers are "NO", may proceed with cephalosporin use.    Augmentin [Amoxicillin-Pot Clavulanate] Diarrhea   Bactrim [Sulfamethoxazole-Trimethoprim] Other (See Comments)    N/V/D   Codeine Nausea And Vomiting   Eggs Or Egg-Derived Products     ALLergic to cook or raw egg only. Food with egg ingredient is okay for patient.    Influenza Vaccines Other (See Comments)    Reaction:  Caused pt to pass out    Influenza Virus Vaccine Other (See Comments)    Reaction: Passed out for 12 days   Methadone Hives and Itching   Oxycodone-Acetaminophen Nausea And Vomiting   Percocet [Oxycodone-Acetaminophen] Nausea And Vomiting   Sulfa Antibiotics Other (See Comments)    Reaction: unknown   Tetanus Antitoxin Swelling and Other (See Comments)    Reaction: injection site swelling   Tetanus Toxoids Swelling and Other (See Comments)    Reaction:  Swelling at injection site   Penicillin G Rash    Has patient had a PCN  reaction causing immediate rash, facial/tongue/throat swelling, SOB or lightheadedness with hypotension: Yes Has patient had a PCN reaction  causing severe rash involving mucus membranes or skin necrosis: No Has patient had a PCN reaction that required hospitalization: No Has patient had a PCN reaction occurring within the last 10 years: No If all of the above answers are "NO", then may proceed with Cephalosporin use..   Tetracyclines & Related Rash     The results of significant diagnostics from this hospitalization (including imaging, microbiology, ancillary and laboratory) are listed below for reference.   Consultations:   Procedures/Studies: DG Mandible 1-3 Views  Result Date: 06/23/2022 CLINICAL DATA:  76 year old female with RIGHT jaw pain. EXAM: MANDIBLE - 3 VIEW COMPARISON:  None Available. FINDINGS: There is no evidence of acute fracture or dislocation. No focal bony lesions are identified. No acute bony abnormalities are present. IMPRESSION: No evidence of acute abnormality. Consider CT for further evaluation, as clinically indicated. Electronically Signed   By: Harmon Pier M.D.   On: 06/23/2022 15:46   US Venous Img Lower Bilateral (DVT)  Result Date: 06/11/2022 CLINICAL DATA:  Bilateral leg pain and swelling, initial encounter EXAM: BILATERAL LOWER EXTREMITY VENOUS DOPPLER ULTRASOUND TECHNIQUE: Gray-scale sonography with graded compression, as well as color Doppler and duplex ultrasound were performed to evaluate the lower extremity deep venous systems from the level of the common femoral vein and including the common femoral, femoral, profunda femoral, popliteal and calf veins including the posterior tibial, peroneal and gastrocnemius veins when visible. The superficial great saphenous vein was also interrogated. Spectral Doppler was utilized to evaluate flow at rest and with distal augmentation maneuvers in the common femoral, femoral and popliteal veins. COMPARISON:  None Available.  FINDINGS: RIGHT LOWER EXTREMITY Common Femoral Vein: No evidence of thrombus. Normal compressibility, respiratory phasicity and response to augmentation. Saphenofemoral Junction: No evidence of thrombus. Normal compressibility and flow on color Doppler imaging. Profunda Femoral Vein: No evidence of thrombus. Normal compressibility and flow on color Doppler imaging. Femoral Vein: No evidence of thrombus. Normal compressibility, respiratory phasicity and response to augmentation. Popliteal Vein: No evidence of thrombus. Normal compressibility, respiratory phasicity and response to augmentation. Calf Veins: No evidence of thrombus. Normal compressibility and flow on color Doppler imaging. Superficial Great Saphenous Vein: No evidence of thrombus. Normal compressibility. Venous Reflux:  None. Other Findings:  None. LEFT LOWER EXTREMITY Common Femoral Vein: No evidence of thrombus. Normal compressibility, respiratory phasicity and response to augmentation. Saphenofemoral Junction: No evidence of thrombus. Normal compressibility and flow on color Doppler imaging. Profunda Femoral Vein: No evidence of thrombus. Normal compressibility and flow on color Doppler imaging. Femoral Vein: No evidence of thrombus. Normal compressibility, respiratory phasicity and response to augmentation. Popliteal Vein: No evidence of thrombus. Normal compressibility, respiratory phasicity and response to augmentation. Calf Veins: No evidence of thrombus. Normal compressibility and flow on color Doppler imaging. Superficial Great Saphenous Vein: No evidence of thrombus. Normal compressibility. Venous Reflux:  None. Other Findings:  None. IMPRESSION: No evidence of deep venous thrombosis in either lower extremity. Electronically Signed   By: Alcide Clever M.D.   On: 06/11/2022 19:07   DG Foot Complete Left  Result Date: 06/11/2022 CLINICAL DATA:  Infection EXAM: LEFT FOOT - COMPLETE 3+ VIEW COMPARISON:  None Available. FINDINGS: Prior second  and third toe amputations. There is blunting of the great toe distal phalanx with mild cortical irregularity. Soft tissue swelling of the great toe. There is severe first MTP degenerative change. Mild midfoot degenerative change. Large plantar calcaneal spur with mineralization along the plantar fascia. Large Achilles enthesophyte. Mild tibiotalar osteoarthritis. IMPRESSION: Blunted great toe  distal phalangeal tuft with mild cortical regularity, could potentially represent osteomyelitis. Prior second and third toe amputations. Severe first MTP osteoarthritis. Electronically Signed   By: Caprice Renshaw M.D.   On: 06/11/2022 14:32      Labs: BNP (last 3 results) No results for input(s): "BNP" in the last 8760 hours. Basic Metabolic Panel: No results for input(s): "NA", "K", "CL", "CO2", "GLUCOSE", "BUN", "CREATININE", "CALCIUM", "MG", "PHOS" in the last 168 hours. Liver Function Tests: No results for input(s): "AST", "ALT", "ALKPHOS", "BILITOT", "PROT", "ALBUMIN" in the last 168 hours. No results for input(s): "LIPASE", "AMYLASE" in the last 168 hours. No results for input(s): "AMMONIA" in the last 168 hours. CBC: No results for input(s): "WBC", "NEUTROABS", "HGB", "HCT", "MCV", "PLT" in the last 168 hours. Cardiac Enzymes: No results for input(s): "CKTOTAL", "CKMB", "CKMBINDEX", "TROPONINI" in the last 168 hours. BNP: Invalid input(s): "POCBNP" CBG: Recent Labs  Lab 06/23/22 0830 06/23/22 1123 06/23/22 1631 06/23/22 2047 06/24/22 0756  GLUCAP 128* 300* 120* 173* 141*   D-Dimer No results for input(s): "DDIMER" in the last 72 hours. Hgb A1c No results for input(s): "HGBA1C" in the last 72 hours. Lipid Profile No results for input(s): "CHOL", "HDL", "LDLCALC", "TRIG", "CHOLHDL", "LDLDIRECT" in the last 72 hours. Thyroid function studies No results for input(s): "TSH", "T4TOTAL", "T3FREE", "THYROIDAB" in the last 72 hours.  Invalid input(s): "FREET3" Anemia work up No results for  input(s): "VITAMINB12", "FOLATE", "FERRITIN", "TIBC", "IRON", "RETICCTPCT" in the last 72 hours. Urinalysis    Component Value Date/Time   COLORURINE YELLOW (A) 05/09/2021 0900   APPEARANCEUR HAZY (A) 05/09/2021 0900   APPEARANCEUR Cloudy (A) 06/03/2020 1559   LABSPEC 1.026 05/09/2021 0900   LABSPEC 1.014 12/15/2014 0958   PHURINE 5.0 05/09/2021 0900   GLUCOSEU 50 (A) 05/09/2021 0900   GLUCOSEU Negative 12/15/2014 0958   HGBUR NEGATIVE 05/09/2021 0900   BILIRUBINUR NEGATIVE 05/09/2021 0900   BILIRUBINUR Negative 06/03/2020 1559   BILIRUBINUR Negative 12/15/2014 0958   KETONESUR NEGATIVE 05/09/2021 0900   PROTEINUR NEGATIVE 05/09/2021 0900   NITRITE NEGATIVE 05/09/2021 0900   LEUKOCYTESUR NEGATIVE 05/09/2021 0900   LEUKOCYTESUR 1+ 12/15/2014 0958   Sepsis Labs No results for input(s): "WBC" in the last 168 hours.  Invalid input(s): "PROCALCITONIN", "LACTICIDVEN" Microbiology No results found for this or any previous visit (from the past 240 hour(s)).   Total time spend on discharging this patient, including the last patient exam, discussing the hospital stay, instructions for ongoing care as it relates to all pertinent caregivers, as well as preparing the medical discharge records, prescriptions, and/or referrals as applicable, is 40 minutes.    Darlin Priestly, MD  [facility] 06/24/2022, 10:38 AM

## 2022-06-24 NOTE — Progress Notes (Signed)
Called [facility] and gave report to nurse named Ernestina Patches. Discharge paperwork given to patient who verbalized understanding. Patient waiting on friend to arrive to transport her over to facility.

## 2022-06-24 NOTE — TOC Transition Note (Addendum)
Transition of Care [facility]) - CM/SW Discharge Note   Patient Details  Name: Jessica Donovan MRN: [id number] Date of Birth: [birth year]  [facility] of [facility] CM/SW Contact:  Gildardo Griffes, LCSW Phone Number: 06/24/2022, 10:30 AM   Clinical Narrative:     Patient will DC to: [facility] Anticipated DC date: 06/24/22 Transport by: friend Thomasene Lot  Per MD patient ready for DC to [facility] . RN, patient, patient's family, and facility notified of DC. Discharge Summary sent to facility. RN given number for report  [phone] Room 317P. DC packet on chart. Lorita Officer to transport to [facility]. CSW signing off.  Angeline Slim, LCSW    Final next level of care: Skilled Nursing Facility Barriers to Discharge: No Barriers Identified   Patient Goals and CMS Choice Patient states their goals for this hospitalization and ongoing recovery are:: to go home CMS Medicare.gov Compare Post Acute Care list provided to:: Patient Choice offered to / list presented to : Patient  Discharge Placement              Patient chooses bed at: [facility] Patient to be transferred to facility by: [facility]   Patient and family notified of of transfer: 06/24/22  Discharge Plan and Services                                     Social Determinants of Health (SDOH) Interventions     Readmission Risk Interventions    06/13/2022   10:24 AM  Readmission Risk Prevention Plan  Post Dischage Appt Complete  Medication Screening Complete  Transportation Screening Complete

## 2022-06-24 NOTE — Progress Notes (Signed)
Discharge note: Patient wheeled out by staff with all valuables and discharge paperwork, including hardscripts to be given to staff at [facility]. Patient left via car driven by friend who will take her over to [facility].

## 2022-06-24 NOTE — Plan of Care (Signed)

## 2022-06-24 NOTE — Care Management Important Message (Signed)
Important Message  Patient Details  Name: Jessica Donovan MRN: [id number] Date of Birth: 1946-06-05   Medicare Important Message Given:  Yes     Olegario Messier A Aundra Pung 06/24/2022, 10:44 AM

## 2022-06-26 DIAGNOSIS — E785 Hyperlipidemia, unspecified: Secondary | ICD-10-CM | POA: Diagnosis not present

## 2022-06-26 DIAGNOSIS — E1169 Type 2 diabetes mellitus with other specified complication: Secondary | ICD-10-CM | POA: Diagnosis not present

## 2022-06-26 DIAGNOSIS — Z794 Long term (current) use of insulin: Secondary | ICD-10-CM | POA: Diagnosis not present

## 2022-06-26 DIAGNOSIS — F419 Anxiety disorder, unspecified: Secondary | ICD-10-CM | POA: Diagnosis not present

## 2022-06-26 DIAGNOSIS — K219 Gastro-esophageal reflux disease without esophagitis: Secondary | ICD-10-CM | POA: Diagnosis not present

## 2022-06-26 DIAGNOSIS — G894 Chronic pain syndrome: Secondary | ICD-10-CM | POA: Diagnosis not present

## 2022-06-26 DIAGNOSIS — M62838 Other muscle spasm: Secondary | ICD-10-CM | POA: Diagnosis not present

## 2022-06-26 DIAGNOSIS — D649 Anemia, unspecified: Secondary | ICD-10-CM | POA: Diagnosis not present

## 2022-07-01 DIAGNOSIS — S98112A Complete traumatic amputation of left great toe, initial encounter: Secondary | ICD-10-CM | POA: Diagnosis not present

## 2022-07-01 DIAGNOSIS — E785 Hyperlipidemia, unspecified: Secondary | ICD-10-CM | POA: Diagnosis not present

## 2022-07-01 DIAGNOSIS — I10 Essential (primary) hypertension: Secondary | ICD-10-CM | POA: Diagnosis not present

## 2022-07-01 DIAGNOSIS — M869 Osteomyelitis, unspecified: Secondary | ICD-10-CM | POA: Diagnosis not present

## 2022-07-01 DIAGNOSIS — E1169 Type 2 diabetes mellitus with other specified complication: Secondary | ICD-10-CM | POA: Diagnosis not present

## 2022-07-01 DIAGNOSIS — G894 Chronic pain syndrome: Secondary | ICD-10-CM | POA: Diagnosis not present

## 2022-07-01 DIAGNOSIS — Z794 Long term (current) use of insulin: Secondary | ICD-10-CM | POA: Diagnosis not present

## 2022-07-01 DIAGNOSIS — F419 Anxiety disorder, unspecified: Secondary | ICD-10-CM | POA: Diagnosis not present

## 2022-07-02 DIAGNOSIS — F419 Anxiety disorder, unspecified: Secondary | ICD-10-CM | POA: Diagnosis not present

## 2022-07-02 DIAGNOSIS — E785 Hyperlipidemia, unspecified: Secondary | ICD-10-CM | POA: Diagnosis not present

## 2022-07-02 DIAGNOSIS — S98112A Complete traumatic amputation of left great toe, initial encounter: Secondary | ICD-10-CM | POA: Diagnosis not present

## 2022-07-02 DIAGNOSIS — E1169 Type 2 diabetes mellitus with other specified complication: Secondary | ICD-10-CM | POA: Diagnosis not present

## 2022-07-02 DIAGNOSIS — G894 Chronic pain syndrome: Secondary | ICD-10-CM | POA: Diagnosis not present

## 2022-07-02 DIAGNOSIS — Z794 Long term (current) use of insulin: Secondary | ICD-10-CM | POA: Diagnosis not present

## 2022-07-02 DIAGNOSIS — G4700 Insomnia, unspecified: Secondary | ICD-10-CM | POA: Diagnosis not present

## 2022-07-02 DIAGNOSIS — E559 Vitamin D deficiency, unspecified: Secondary | ICD-10-CM | POA: Diagnosis not present

## 2022-07-09 DIAGNOSIS — Z89421 Acquired absence of other right toe(s): Secondary | ICD-10-CM | POA: Diagnosis not present

## 2022-07-09 DIAGNOSIS — Z89422 Acquired absence of other left toe(s): Secondary | ICD-10-CM | POA: Diagnosis not present

## 2022-07-09 DIAGNOSIS — E1142 Type 2 diabetes mellitus with diabetic polyneuropathy: Secondary | ICD-10-CM | POA: Diagnosis not present

## 2022-07-23 DIAGNOSIS — F3341 Major depressive disorder, recurrent, in partial remission: Secondary | ICD-10-CM | POA: Diagnosis not present

## 2022-07-23 DIAGNOSIS — I10 Essential (primary) hypertension: Secondary | ICD-10-CM | POA: Diagnosis not present

## 2022-07-23 DIAGNOSIS — Z79899 Other long term (current) drug therapy: Secondary | ICD-10-CM | POA: Diagnosis not present

## 2022-07-23 DIAGNOSIS — G894 Chronic pain syndrome: Secondary | ICD-10-CM | POA: Diagnosis not present

## 2022-07-23 DIAGNOSIS — R202 Paresthesia of skin: Secondary | ICD-10-CM | POA: Diagnosis not present

## 2022-07-23 DIAGNOSIS — F411 Generalized anxiety disorder: Secondary | ICD-10-CM | POA: Diagnosis not present

## 2022-07-23 DIAGNOSIS — E1140 Type 2 diabetes mellitus with diabetic neuropathy, unspecified: Secondary | ICD-10-CM | POA: Diagnosis not present

## 2022-07-23 DIAGNOSIS — E785 Hyperlipidemia, unspecified: Secondary | ICD-10-CM | POA: Diagnosis not present

## 2022-08-18 NOTE — Progress Notes (Deleted)
Cardiology Office Note  Date:  08/18/2022   ID:  Cayetana, Hettinger 1945-12-25, MRN [id number]  PCP:  Leonel Ramsay, MD   No chief complaint on file.   HPI:  76 year old woman  history of  coronary artery disease previous stent to the distal RCA and mid circumflex June 2014,  repeat catheterization November 2016 showing patent stents, Poorly controlled diabetes with wounds requiring amputations of toes Hypertension,  hyperlipidemia,  frequent falls,  anxiety,  chronic back pain,  In hospital 06/10/2017 for chest pain, neg enz and stress test OSA presenting for follow up of her chest pain, recent falls  LOV 3/21   In general she reports having no significant chest pain  Reports having frequent falls Reports having 5 falls Has a care taker that stays 2 hours Has PT  Hospital records reviewed from February In the hospital 01/2020 lethargic and unarousable. blood pressure of 90/59 and a repeat at 84/46 O2 saturation was 86% All her pain medications/Flexeril/morphine and Xanax were held.  Reason for presentation felt secondary to polypharmacy  In the ER, fall 02/25/2020 oversedated from her medications. sats 87% Again same presentation as above  Pain meds managed by Dr. Cindra Presume morphone TID, xanax TID and flexeril TID  Discharge medications from the hospital, Flexeril was discontinued but she reports she is taking it  Morphine and Xanax reduced to no more than twice daily, reports that she is taking this 3 times daily still  Was told to stop Imdur, lisinopril given hypotension and syncope likely from low blood pressure Reports that she was having pain, went back on her Imdur on her own  Other past medical history reviewed In the ER 05/10/2019, mental status changes found poorly responsive in her bed by her neighbor. concerns for overdose  EKG personally reviewed by myself on todays visit Shows normal sinus rhythm rate 87 bpm poor R-wave  progression   Other past medical history reviewed seen several cardiologists in [place],  then [place]  And she requested our group  Previously evaluated in the hospital, felt that her  stents have "fallen out" Pain was felt to be atypical in nature Cardiac enzymes negative 3 EKG unchanged from baseline  She had Stress test 06/10/2017 Pharmacological myocardial perfusion imaging study with no significant  ischemia Normal wall motion, EF estimated at 90% No EKG changes concerning for ischemia at peak stress or in recovery. Low risk scan   PMH:   has a past medical history of Anxiety, Chronic back pain, Collagen vascular disease ([place]), Coronary artery disease, Diabetes mellitus without complication ([place]), Hypertension, Low back pain, Myocardial infarction [facility]), Osteomyelitis of toe ([place]) (01/23/2016), and Stroke  [facility]).  PSH:    Past Surgical History:  Procedure Laterality Date   AMPUTATION TOE Left 11/10/2015   Procedure: AMPUTATION TOE;  Surgeon: Sharlotte Alamo, MD;  Location: [facility] ORS;  Service: Podiatry;  Laterality: Left;   AMPUTATION TOE Left 01/24/2016   Procedure: AMPUTATION TOE (2nd mpj);  Surgeon: Sharlotte Alamo, DPM;  Location: [facility] ORS;  Service: Podiatry;  Laterality: Left;   AMPUTATION TOE Right 12/21/2018   Procedure: 2ND TOE AMPUTATION WITH DEBRIDEMENT OF SOFT TISSUE;  Surgeon: Albertine Patricia, DPM;  Location: [facility] ORS;  Service: Podiatry;  Laterality: Right;   AMPUTATION TOE Left 06/12/2022   Procedure: AMPUTATION TOE; PARTIAL AMPUTATION LEFT GREAT TOE;  Surgeon: Sharlotte Alamo, DPM;  Location: [facility] ORS;  Service: Podiatry;  Laterality: Left;   CARDIAC CATHETERIZATION N/A 11/12/2015   Procedure: Left Heart Cath and  Coronary Angiography;  Surgeon: Marcina Millard, MD;  Location[facility] INVASIVE CV LAB;  Service: Cardiovascular;  Laterality: N/A;   COLONOSCOPY WITH PROPOFOL N/A 07/20/2017   Procedure: COLONOSCOPY WITH PROPOFOL;  Surgeon: Wyline Mood, MD;  Location: [facility] ENDOSCOPY;   Service: Endoscopy;  Laterality: N/A;   JOINT REPLACEMENT     TOE AMPUTATION      Current Outpatient Medications  Medication Sig Dispense Refill   acetaminophen (TYLENOL) 500 MG tablet Take 1,000 mg by mouth every 8 (eight) hours as needed for mild pain or headache.      AMBULATORY NON FORMULARY MEDICATION Compounded estrogen  Apply 1 gram vaginally M,W, Friday [facility] 1 applicator 0   aspirin 81 MG tablet Take 81 mg by mouth daily.     atorvastatin (LIPITOR) 20 MG tablet Take 20 mg by mouth daily.      celecoxib (CELEBREX) 100 MG capsule Take 1 capsule (100 mg total) by mouth 2 (two) times daily as needed (home med).     cyclobenzaprine (FLEXERIL) 10 MG tablet Take 10 mg by mouth 3 (three) times daily as needed.      insulin aspart (NOVOLOG) 100 UNIT/ML injection Inject 3-15 Units into the skin 3 (three) times daily with meals as needed for high blood sugar. Pt uses as needed per sliding scale:    Less than 140:  0 units  140-180:  3 units 181-220:  4 units 221- 260:  6 units 261- 320:  8 units 321-360:  10 units 361-400:  12 units Greater than 400:  15 units     isosorbide mononitrate (IMDUR) 30 MG 24 hr tablet Take 30 mg by mouth daily.     lisinopril (ZESTRIL) 20 MG tablet Take 20 mg by mouth daily.     metoprolol tartrate (LOPRESSOR) 25 MG tablet Take 1 tablet (25 mg total) by mouth 2 (two) times daily. 60 tablet 0   mirtazapine (REMERON) 15 MG tablet Take 15 mg by mouth at bedtime.     Multiple Vitamins-Minerals (HAIR SKIN AND NAILS FORMULA PO) Take by mouth daily. 2 gummies daily     nitrofurantoin, macrocrystal-monohydrate, (MACROBID) 100 MG capsule Take 100 mg by mouth daily.     omeprazole (PRILOSEC) 40 MG capsule Take 1 capsule (40 mg total) by mouth daily.     pregabalin (LYRICA) 100 MG capsule Take 100 mg by mouth 3 (three) times daily.     TOUJEO SOLOSTAR 300 UNIT/ML SOPN Inject 60 Units into the skin every morning.     traZODone (DESYREL) 100 MG tablet  Take 100 mg by mouth at bedtime.     venlafaxine XR (EFFEXOR-XR) 75 MG 24 hr capsule Take 75 mg by mouth 2 (two) times daily.     Vitamin D, Ergocalciferol, (DRISDOL) 50000 units CAPS capsule Take 50,000 Units by mouth every Sunday.      No current facility-administered medications for this visit.     Allergies:   Amoxicillin, Ampicillin, Augmentin [amoxicillin-pot clavulanate], Bactrim [sulfamethoxazole-trimethoprim], Codeine, Eggs or egg-derived products, Influenza vaccines, Influenza virus vaccine, Methadone, Oxycodone-acetaminophen, Percocet [oxycodone-acetaminophen], Sulfa antibiotics, Tetanus antitoxin, Tetanus toxoids, Penicillin g, and Tetracyclines & related   Social History:  The patient  reports that she has never smoked. She has never used smokeless tobacco. She reports that she does not drink alcohol and does not use drugs.   Family History:   family history includes CAD in her father and mother.    Review of Systems: Review of Systems  Constitutional: Negative.   Respiratory:  Negative.    Cardiovascular: Negative.   Gastrointestinal: Negative.   Musculoskeletal: Negative.   Neurological: Negative.   Psychiatric/Behavioral: Negative.    All other systems reviewed and are negative.    PHYSICAL EXAM: VS:  There were no vitals taken for this visit. , BMI There is no height or weight on file to calculate BMI. Constitutional:  oriented to person, place, and time. No distress.  HENT:  Head: Grossly normal Eyes:  no discharge. No scleral icterus.  Neck: No JVD, no carotid bruits  Cardiovascular: Regular rate and rhythm, no murmurs appreciated Pulmonary/Chest: Clear to auscultation bilaterally, no wheezes or rails Abdominal: Soft.  no distension.  no tenderness.  Musculoskeletal: Normal range of motion Neurological:  normal muscle tone. Coordination normal. No atrophy Skin: Skin warm and dry Psychiatric: normal affect, pleasant   Recent Labs: 06/11/2022: ALT  13 06/12/2022: BUN 19; Potassium 4.4; Sodium 142 06/16/2022: Creatinine, Ser 0.69; Hemoglobin 13.3; Platelets 261    Lipid Panel Lab Results  Component Value Date   CHOL 158 06/09/2017   HDL 46 06/09/2017   LDLCALC 64 06/09/2017   TRIG 242 (H) 06/09/2017      Wt Readings from Last 3 Encounters:  06/12/22 179 lb 0.2 oz (81.2 kg)  05/09/21 260 lb (117.9 kg)  11/14/20 200 lb (90.7 kg)       ASSESSMENT AND PLAN:  Coronary artery disease of native artery of native heart with stable angina pectoris (HCC) Continue current medications Cholesterol at goal Recommend she try to wean down her off the isosorbide given her low blood pressure and tendency to overmedicate with her psychotropic and pain medication causing hypotension  Chronic chest pain Long history of changing cardiologists, 2016 workup including cardiac catheterization showing patent stents, stress test 2 2017 and 2018 with no ischemia  30% lesions noted on catheterization November 2016 No further workup needed.  Recommend she try to wean off the isosorbide   Anxiety Followed by primary care Notes not available   Frequent falls/balance problems Long history of overmedicating She reports her Flexeril morphine Xanax comes from primary care Dr. Emilia Beck She was recommended to stop the Flexeril but she is taking this 3 times daily Recommended to decrease Xanax and morphine down to twice daily but she continues to take this 3 times daily To recent hospital admissions for hypotension and hypoxia felt secondary to using too much of this medication -She does not seem to be following guidance from the discharge paperwork Stressed to her the dangers of being overmedicated she reports that she " needs them"   Diabetes type 2 Follow-up primary care  Numerous hospitalizations, numerous medication issues discussed with her, discussed polypharmacy  Total encounter time more than 45 minutes  Greater than 50% was spent in  counseling and coordination of care with the patient     No orders of the defined types were placed in this encounter.    Signed, Dossie Arbour, M.D., Ph.D. 08/18/2022  [facility] [place], [place] [phone]

## 2022-08-19 ENCOUNTER — Ambulatory Visit: Payer: No Typology Code available for payment source | Admitting: Cardiovascular Disease

## 2022-11-14 NOTE — Progress Notes (Deleted)
NO SHOW

## 2022-11-17 ENCOUNTER — Emergency Department: Payer: Medicare PPO

## 2022-11-17 ENCOUNTER — Ambulatory Visit: Payer: Medicare PPO | Admitting: Cardiovascular Disease

## 2022-11-17 ENCOUNTER — Encounter: Payer: Self-pay | Admitting: Emergency Medicine

## 2022-11-17 ENCOUNTER — Observation Stay
Admission: EM | Admit: 2022-11-17 | Discharge: 2022-11-18 | Disposition: A | Discharge status: Home / Self Care | Payer: Medicare PPO | Attending: Emergency Medicine | Admitting: Emergency Medicine

## 2022-11-17 ENCOUNTER — Other Ambulatory Visit: Payer: Self-pay

## 2022-11-17 DIAGNOSIS — Z66 Do not resuscitate: Secondary | ICD-10-CM | POA: Insufficient documentation

## 2022-11-17 DIAGNOSIS — Z955 Presence of coronary angioplasty implant and graft: Secondary | ICD-10-CM | POA: Insufficient documentation

## 2022-11-17 DIAGNOSIS — Z79899 Other long term (current) drug therapy: Secondary | ICD-10-CM | POA: Diagnosis not present

## 2022-11-17 DIAGNOSIS — F411 Generalized anxiety disorder: Secondary | ICD-10-CM

## 2022-11-17 DIAGNOSIS — Z9660 Presence of unspecified orthopedic joint implant: Secondary | ICD-10-CM | POA: Diagnosis not present

## 2022-11-17 DIAGNOSIS — G510 Bell's palsy: Principal | ICD-10-CM | POA: Insufficient documentation

## 2022-11-17 DIAGNOSIS — F0390 Unspecified dementia without behavioral disturbance: Secondary | ICD-10-CM | POA: Diagnosis not present

## 2022-11-17 DIAGNOSIS — Z8673 Personal history of transient ischemic attack (TIA), and cerebral infarction without residual deficits: Secondary | ICD-10-CM | POA: Insufficient documentation

## 2022-11-17 DIAGNOSIS — I10 Essential (primary) hypertension: Secondary | ICD-10-CM

## 2022-11-17 DIAGNOSIS — Z794 Long term (current) use of insulin: Secondary | ICD-10-CM | POA: Insufficient documentation

## 2022-11-17 DIAGNOSIS — R29810 Facial weakness: Secondary | ICD-10-CM | POA: Diagnosis present

## 2022-11-17 DIAGNOSIS — Z7982 Long term (current) use of aspirin: Secondary | ICD-10-CM | POA: Insufficient documentation

## 2022-11-17 DIAGNOSIS — I2510 Atherosclerotic heart disease of native coronary artery without angina pectoris: Secondary | ICD-10-CM | POA: Diagnosis not present

## 2022-11-17 DIAGNOSIS — E1140 Type 2 diabetes mellitus with diabetic neuropathy, unspecified: Secondary | ICD-10-CM | POA: Diagnosis not present

## 2022-11-17 DIAGNOSIS — F02A0 Dementia in other diseases classified elsewhere, mild, without behavioral disturbance, psychotic disturbance, mood disturbance, and anxiety: Secondary | ICD-10-CM

## 2022-11-17 DIAGNOSIS — G894 Chronic pain syndrome: Secondary | ICD-10-CM

## 2022-11-17 DIAGNOSIS — I259 Chronic ischemic heart disease, unspecified: Secondary | ICD-10-CM

## 2022-11-17 DIAGNOSIS — E119 Type 2 diabetes mellitus without complications: Secondary | ICD-10-CM

## 2022-11-17 DIAGNOSIS — I639 Cerebral infarction, unspecified: Secondary | ICD-10-CM

## 2022-11-17 LAB — CBC
HCT: 41.2 % (ref 36.0–46.0)
Hemoglobin: 13.1 g/dL (ref 12.0–15.0)
MCH: 31.4 pg (ref 26.0–34.0)
MCHC: 31.8 g/dL (ref 30.0–36.0)
MCV: 98.8 fL (ref 80.0–100.0)
Platelets: 298 10*3/uL (ref 150–400)
RBC: 4.17 MIL/uL (ref 3.87–5.11)
RDW: 12.9 % (ref 11.5–15.5)
WBC: 10.5 10*3/uL (ref 4.0–10.5)
nRBC: 0 % (ref 0.0–0.2)

## 2022-11-17 LAB — COMPREHENSIVE METABOLIC PANEL
ALT: 13 U/L (ref 0–44)
AST: 24 U/L (ref 15–41)
Albumin: 4.2 g/dL (ref 3.5–5.0)
Alkaline Phosphatase: 72 U/L (ref 38–126)
Anion gap: 7 (ref 5–15)
BUN: 24 mg/dL — ABNORMAL HIGH (ref 8–23)
CO2: 28 mmol/L (ref 22–32)
Calcium: 9.5 mg/dL (ref 8.9–10.3)
Chloride: 104 mmol/L (ref 98–111)
Creatinine, Ser: 0.9 mg/dL (ref 0.44–1.00)
GFR, Estimated: >60 mL/min (ref >60)
Glucose, Bld: 91 mg/dL (ref 70–99)
Potassium: 4.6 mmol/L (ref 3.5–5.1)
Sodium: 139 mmol/L (ref 135–145)
Total Bilirubin: 0.4 mg/dL (ref 0.3–1.2)
Total Protein: 7 g/dL (ref 6.5–8.1)

## 2022-11-17 LAB — DIFFERENTIAL
Abs Immature Granulocytes: 0.04 10*3/uL (ref 0.00–0.07)
Basophils Absolute: 0.1 10*3/uL (ref 0.0–0.1)
Basophils Relative: 1 %
Eosinophils Absolute: 0.2 10*3/uL (ref 0.0–0.5)
Eosinophils Relative: 2 %
Immature Granulocytes: 0 %
Lymphocytes Relative: 33 %
Lymphs Abs: 3.5 10*3/uL (ref 0.7–4.0)
Monocytes Absolute: 0.8 10*3/uL (ref 0.1–1.0)
Monocytes Relative: 8 %
Neutro Abs: 5.9 10*3/uL (ref 1.7–7.7)
Neutrophils Relative %: 56 %

## 2022-11-17 LAB — APTT: aPTT: 28 seconds (ref 24–36)

## 2022-11-17 LAB — PROTIME-INR
INR: 1 (ref 0.8–1.2)
Prothrombin Time: 13.2 seconds (ref 11.4–15.2)

## 2022-11-17 LAB — ETHANOL: Alcohol, Ethyl (B): <10 mg/dL (ref <10)

## 2022-11-17 MED ORDER — ASPIRIN 81 MG PO CHEW
324.0000 mg | CHEWABLE_TABLET | Freq: Once | ORAL | Status: AC
  Administered 2022-11-17: 324 mg via ORAL
  Filled 2022-11-17: qty 4

## 2022-11-17 MED ORDER — SODIUM CHLORIDE 0.9% FLUSH
3.0000 mL | Freq: Once | INTRAVENOUS | Status: AC
  Administered 2022-11-18: 3 mL via INTRAVENOUS

## 2022-11-17 NOTE — ED Triage Notes (Signed)
Patient to ED via POV for stroke symptoms that started on Friday morning. Facial droop noted. Less sensation noted to right side of body. Equal strength. C/o pain in head/neck. Hx of stroke on the left side.

## 2022-11-17 NOTE — ED Notes (Signed)
Pt placed back on waiting room list....family member now with here with pt and st she would like to be seen

## 2022-11-17 NOTE — ED Notes (Signed)
Pt upset over wait time...st she does not wish to be seen any longer and has a family member coming to get her now

## 2022-11-17 NOTE — ED Provider Notes (Signed)
[facility] Provider Note    Event Date/Time   First MD Initiated Contact with Patient 11/17/22 2311     (approximate)   History   Chief Complaint: Stroke Symptoms   HPI  Jessica Donovan is a 76 y.o. female with a history of hypertension, CAD, insulin-dependent diabetes, prior stroke who comes the ED complaining of right facial droop, right arm weakness and feeling off balance which started 4 days ago on the morning of November 24.  When she woke up that morning, she initially felt that her right arm was very heavy and had paresthesias, and after about an hour that started to get better, but then in the bathroom she noted that her right mouth and maxillary face appeared droopy and it was harder to talk.  She also noticed that she felt off balance and when she was trying to walk forward she started to stumble backward.  Denies head trauma.  No fever.     Physical Exam   Triage Vital Signs: ED Triage Vitals  Enc Vitals Group     BP 11/17/22 1512 121/65     Pulse Rate 11/17/22 1512 79     Resp 11/17/22 1512 18     Temp 11/17/22 1512 98.4 F (36.9 C)     Temp Source 11/17/22 1512 Oral     SpO2 11/17/22 1512 93 %     Weight --      Height --      Head Circumference --      Peak Flow --      Pain Score 11/17/22 1515 8     Pain Loc --      Pain Edu? --      Excl. in GC? --     Most recent vital signs: Vitals:   11/17/22 1512  BP: 121/65  Pulse: 79  Resp: 18  Temp: 98.4 F (36.9 C)  SpO2: 93%    General: Awake, no distress.  CV:  Good peripheral perfusion.  Regular rate and rhythm Resp:  Normal effort.  Clear to auscultation bilaterally Abd:  No distention.  Soft nontender Other:  No lower extremity edema.  NIH Stroke Scale: 4 Neuro exam abnormal for right facial droop, right leg drift, dysarthria    ED Results / Procedures / Treatments   Labs (all labs ordered are listed, but only abnormal results are displayed) Labs  Reviewed  COMPREHENSIVE METABOLIC PANEL - Abnormal; Notable for the following components:      Result Value   BUN 24 (*)    All other components within normal limits  PROTIME-INR  APTT  CBC  DIFFERENTIAL  ETHANOL  URINE DRUG SCREEN, QUALITATIVE ([facility] ONLY)  URINALYSIS, ROUTINE W REFLEX MICROSCOPIC  CBG MONITORING, ED     EKG Interpreted by me Normal sinus rhythm rate of 81.  Left axis, normal intervals.  Poor R wave progression.  Normal ST segments and T waves.   RADIOLOGY CT head interpreted by me, negative for mass obvious infarct or intracranial hemorrhage.  Radiology report reviewed.   PROCEDURES:  Procedures   MEDICATIONS ORDERED IN ED: Medications  sodium chloride flush (NS) 0.9 % injection 3 mL (has no administration in time range)  aspirin chewable tablet 324 mg (has no administration in time range)     IMPRESSION / MDM / ASSESSMENT AND PLAN / ED COURSE  I reviewed the triage vital signs and the nursing notes.  Differential diagnosis includes, but is not limited to, ischemic stroke, dehydration, electrolyte abnormality, hyperglycemia, UTI  Patient's presentation is most consistent with acute presentation with potential threat to life or bodily function.  Patient presents with symptoms of acute stroke.  Outside of window for any intervention such as thrombolysis or endovascular procedure.  Initial labs and CT head negative.  Will give aspirin after swallow screen, plan to admit.  Patient agrees with hospitalization.   ----------------------------------------- 12:04 AM on 11/18/2022 ----------------------------------------- Case discussed with hospitalist.      FINAL CLINICAL IMPRESSION(S) / ED DIAGNOSES   Final diagnoses:  Acute CVA (cerebrovascular accident) ([place])  Type 2 diabetes mellitus without complication, with long-term current use of insulin ([place])     Rx / DC Orders   ED Discharge Orders     None         Note:  This document was prepared using Dragon voice recognition software and may include unintentional dictation errors.   Carrie Mew, MD 11/17/22 Darci Needle    Carrie Mew, MD 11/18/22 [phone]

## 2022-11-17 NOTE — ED Provider Triage Note (Signed)
Emergency Medicine Provider Triage Evaluation Note  Cheyla Duchemin , a 76 y.o. female  was evaluated in triage.  Pt complains of right side facial droop, headache, and neck pain with decreased sensation to the right face, upper and lower extremity. Symptoms started FRIDAY morning, but she had tickets to go to a show and decided symptoms would just go away, but they didn't.   Physical Exam  BP 121/65 (BP Location: Right Arm)   Pulse 79   Temp 98.4 F (36.9 C) (Oral)   Resp 18   SpO2 93%  Gen:   Awake, no distress   Resp:  Normal effort  MSK:   Moves extremities without difficulty  Other:    Medical Decision Making  Medically screening exam initiated at 3:17 PM.  Appropriate orders placed.  Kanyia Heaslip Plotts was informed that the remainder of the evaluation will be completed by another provider, this initial triage assessment does not replace that evaluation, and the importance of remaining in the ED until their evaluation is complete.   Chinita Pester, FNP 11/17/22 1519

## 2022-11-18 ENCOUNTER — Observation Stay: Payer: Medicare PPO

## 2022-11-18 ENCOUNTER — Encounter: Payer: Self-pay | Admitting: Cardiovascular Disease

## 2022-11-18 DIAGNOSIS — G510 Bell's palsy: Secondary | ICD-10-CM | POA: Diagnosis not present

## 2022-11-18 DIAGNOSIS — I639 Cerebral infarction, unspecified: Secondary | ICD-10-CM

## 2022-11-18 LAB — URINALYSIS, ROUTINE W REFLEX MICROSCOPIC
Bilirubin Urine: NEGATIVE
Glucose, UA: NEGATIVE mg/dL
Hgb urine dipstick: NEGATIVE
Ketones, ur: NEGATIVE mg/dL
Leukocytes,Ua: NEGATIVE
Nitrite: NEGATIVE
Protein, ur: NEGATIVE mg/dL
Specific Gravity, Urine: 1.009 (ref 1.005–1.030)
pH: 5 (ref 5.0–8.0)

## 2022-11-18 LAB — CBG MONITORING, ED
Glucose-Capillary: 280 mg/dL — ABNORMAL HIGH (ref 70–99)
Glucose-Capillary: 349 mg/dL — ABNORMAL HIGH (ref 70–99)

## 2022-11-18 LAB — URINE DRUG SCREEN, QUALITATIVE (ARMC ONLY)
Amphetamines, Ur Screen: NOT DETECTED
Barbiturates, Ur Screen: NOT DETECTED
Benzodiazepine, Ur Scrn: POSITIVE — AB
Cannabinoid 50 Ng, Ur ~~LOC~~: NOT DETECTED
Cocaine Metabolite,Ur ~~LOC~~: NOT DETECTED
MDMA (Ecstasy)Ur Screen: NOT DETECTED
Methadone Scn, Ur: NOT DETECTED
Opiate, Ur Screen: POSITIVE — AB
Phencyclidine (PCP) Ur S: NOT DETECTED
Tricyclic, Ur Screen: POSITIVE — AB

## 2022-11-18 LAB — LIPID PANEL
Cholesterol: 162 mg/dL (ref 0–200)
HDL: 46 mg/dL (ref >40)
LDL Cholesterol: 75 mg/dL (ref 0–99)
Total CHOL/HDL Ratio: 3.5 RATIO
Triglycerides: 206 mg/dL — ABNORMAL HIGH (ref <150)
VLDL: 41 mg/dL — ABNORMAL HIGH (ref 0–40)

## 2022-11-18 MED ORDER — VALACYCLOVIR HCL 1 G PO TABS: 1000.0000 mg | ORAL_TABLET | Freq: Three times a day (TID) | ORAL | 0 refills | Status: AC

## 2022-11-18 MED ORDER — STROKE: EARLY STAGES OF RECOVERY BOOK: Freq: Once | Status: DC

## 2022-11-18 MED ORDER — POLYVINYL ALCOHOL 1.4 % OP SOLN
1.0000 [drp] | Freq: Four times a day (QID) | OPHTHALMIC | Status: DC
  Administered 2022-11-18: 1 [drp] via OPHTHALMIC
  Filled 2022-11-18 (×2): qty 15

## 2022-11-18 MED ORDER — ACETAMINOPHEN 325 MG PO TABS: 650.0000 mg | ORAL_TABLET | Freq: Four times a day (QID) | ORAL | Status: DC | PRN

## 2022-11-18 MED ORDER — ASPIRIN 81 MG PO TBEC
81.0000 mg | DELAYED_RELEASE_TABLET | Freq: Every day | ORAL | Status: DC
  Administered 2022-11-18: 81 mg via ORAL
  Filled 2022-11-18: qty 1

## 2022-11-18 MED ORDER — ENOXAPARIN SODIUM 40 MG/0.4ML IJ SOSY: 40.0000 mg | PREFILLED_SYRINGE | INTRAMUSCULAR | Status: DC

## 2022-11-18 MED ORDER — INSULIN ASPART 100 UNIT/ML IJ SOLN
0.0000 [IU] | Freq: Three times a day (TID) | INTRAMUSCULAR | Status: DC
  Administered 2022-11-18: 11 [IU] via SUBCUTANEOUS
  Administered 2022-11-18: 8 [IU] via SUBCUTANEOUS
  Filled 2022-11-18 (×2): qty 1

## 2022-11-18 MED ORDER — ONDANSETRON HCL 4 MG PO TABS: 4.0000 mg | ORAL_TABLET | Freq: Four times a day (QID) | ORAL | Status: DC | PRN

## 2022-11-18 MED ORDER — CLOPIDOGREL BISULFATE 75 MG PO TABS
75.0000 mg | ORAL_TABLET | Freq: Every day | ORAL | Status: DC
  Administered 2022-11-18: 75 mg via ORAL
  Filled 2022-11-18: qty 1

## 2022-11-18 MED ORDER — INSULIN GLARGINE-YFGN 100 UNIT/ML ~~LOC~~ SOLN
30.0000 [IU] | Freq: Every morning | SUBCUTANEOUS | Status: DC
  Administered 2022-11-18: 30 [IU] via SUBCUTANEOUS
  Filled 2022-11-18: qty 0.3

## 2022-11-18 MED ORDER — ALPRAZOLAM 0.5 MG PO TABS
0.5000 mg | ORAL_TABLET | Freq: Four times a day (QID) | ORAL | Status: DC | PRN
  Administered 2022-11-18: 0.5 mg via ORAL
  Filled 2022-11-18: qty 1

## 2022-11-18 MED ORDER — ACETAMINOPHEN 325 MG RE SUPP: 650.0000 mg | RECTAL | Status: DC | PRN

## 2022-11-18 MED ORDER — MIRTAZAPINE 15 MG PO TABS
15.0000 mg | ORAL_TABLET | Freq: Every day | ORAL | Status: DC
  Administered 2022-11-18: 15 mg via ORAL
  Filled 2022-11-18: qty 1

## 2022-11-18 MED ORDER — METOPROLOL TARTRATE 25 MG PO TABS
25.0000 mg | ORAL_TABLET | Freq: Two times a day (BID) | ORAL | Status: DC
  Administered 2022-11-18: 25 mg via ORAL
  Filled 2022-11-18: qty 1

## 2022-11-18 MED ORDER — PREGABALIN 50 MG PO CAPS
100.0000 mg | ORAL_CAPSULE | Freq: Three times a day (TID) | ORAL | Status: DC
  Administered 2022-11-18 (×2): 100 mg via ORAL
  Filled 2022-11-18 (×2): qty 2

## 2022-11-18 MED ORDER — ONDANSETRON HCL 4 MG/2ML IJ SOLN: 4.0000 mg | Freq: Four times a day (QID) | INTRAMUSCULAR | Status: DC | PRN

## 2022-11-18 MED ORDER — ENOXAPARIN SODIUM 60 MG/0.6ML IJ SOSY
0.5000 mg/kg | PREFILLED_SYRINGE | INTRAMUSCULAR | Status: DC
  Administered 2022-11-18: 45 mg via SUBCUTANEOUS
  Filled 2022-11-18: qty 0.6

## 2022-11-18 MED ORDER — PANTOPRAZOLE SODIUM 40 MG PO TBEC
40.0000 mg | DELAYED_RELEASE_TABLET | Freq: Every day | ORAL | Status: DC
  Administered 2022-11-18: 40 mg via ORAL
  Filled 2022-11-18: qty 1

## 2022-11-18 MED ORDER — VALACYCLOVIR HCL 500 MG PO TABS
1000.0000 mg | ORAL_TABLET | Freq: Three times a day (TID) | ORAL | Status: DC
  Administered 2022-11-18 (×3): 1000 mg via ORAL
  Filled 2022-11-18 (×3): qty 2

## 2022-11-18 MED ORDER — VENLAFAXINE HCL ER 75 MG PO CP24
75.0000 mg | ORAL_CAPSULE | Freq: Two times a day (BID) | ORAL | Status: DC
  Administered 2022-11-18: 75 mg via ORAL
  Filled 2022-11-18: qty 1

## 2022-11-18 MED ORDER — ACETAMINOPHEN 325 MG PO TABS: 650.0000 mg | ORAL_TABLET | ORAL | Status: DC | PRN

## 2022-11-18 MED ORDER — MORPHINE SULFATE 15 MG PO TABS
15.0000 mg | ORAL_TABLET | Freq: Two times a day (BID) | ORAL | Status: DC
  Administered 2022-11-18: 15 mg via ORAL
  Filled 2022-11-18 (×2): qty 1

## 2022-11-18 MED ORDER — PREDNISONE 20 MG PO TABS
60.0000 mg | ORAL_TABLET | Freq: Every day | ORAL | Status: DC
  Administered 2022-11-18: 60 mg via ORAL
  Filled 2022-11-18: qty 3

## 2022-11-18 MED ORDER — ATORVASTATIN CALCIUM 20 MG PO TABS
20.0000 mg | ORAL_TABLET | Freq: Every day | ORAL | Status: DC
  Administered 2022-11-18: 20 mg via ORAL
  Filled 2022-11-18: qty 1

## 2022-11-18 MED ORDER — ISOSORBIDE MONONITRATE ER 60 MG PO TB24
30.0000 mg | ORAL_TABLET | Freq: Every day | ORAL | Status: DC
  Administered 2022-11-18: 30 mg via ORAL
  Filled 2022-11-18: qty 1

## 2022-11-18 MED ORDER — PREDNISONE 20 MG PO TABS: 60.0000 mg | ORAL_TABLET | Freq: Every day | ORAL | 0 refills | Status: AC

## 2022-11-18 MED ORDER — POLYVINYL ALCOHOL 1.4 % OP SOLN: 1.0000 [drp] | Freq: Four times a day (QID) | OPHTHALMIC | 0 refills | Status: DC

## 2022-11-18 MED ORDER — INSULIN ASPART 100 UNIT/ML IJ SOLN: 0.0000 [IU] | Freq: Every day | INTRAMUSCULAR | Status: DC

## 2022-11-18 MED ORDER — ACETAMINOPHEN 160 MG/5ML PO SOLN: 650.0000 mg | ORAL | Status: DC | PRN

## 2022-11-18 MED ORDER — ACETAMINOPHEN 325 MG RE SUPP: 650.0000 mg | Freq: Four times a day (QID) | RECTAL | Status: DC | PRN

## 2022-11-18 NOTE — TOC Initial Note (Signed)
Transition of Care [facility]) - Initial/Assessment Note    Patient Details  Name: Jessica Donovan MRN: [id number] Date of Birth: [birth year]  [facility] CM/SW Contact:    Allayne Butcher, RN Phone Number: 11/18/2022, 2:55 PM  Clinical Narrative:                 Patient placed under observation for Bells palsy.  Patient is now medically cleared for discharge home today.  RNCM was able to speak with patient via phone.  Patient is from home home where she lives alone.  She walks with a walker when she leaves her apartment but usually does not use anything inside the apartment.  She has [facility] [employer] come out 3 times per week Tue, Thur, Friday for 3 hours from 2-5.  She would like to see if she could get more hours.  Called [facility] to see about getting more hours, they say that they do not have anything to do with that and that [facility] set this up for the patient through their case management.  Called and left a message for [facility] to return call.   Patient will call and see about getting one of her caregiver's Selena Batten to pick her up today.  Patient does not want home health PT or OT arranged, she says she doesn't need it.    Expected Discharge Plan: Home/Self Care Barriers to Discharge: Barriers Resolved   Patient Goals and CMS Choice Patient states their goals for this hospitalization and ongoing recovery are:: Patient does not feel ready to go home, tired and hurting      Expected Discharge Plan and Services Expected Discharge Plan: Home/Self Care   Discharge Planning Services: CM Consult   Living arrangements for the past 2 months: Apartment Expected Discharge Date: 11/18/22               DME Arranged: N/A DME Agency: NA       HH Arranged: Patient Refused HH          Prior Living Arrangements/Services Living arrangements for the past 2 months: Apartment Lives with:: Self Patient language and need for interpreter reviewed::  Yes Do you feel safe going back to the place where you live?: Yes      Need for Family Participation in Patient Care: Yes (Comment) Care giver support system in place?: Yes (comment) Current home services: Comptroller ([facility] [employer] [facility]) Criminal Activity/Legal Involvement Pertinent to Current Situation/Hospitalization: No - Comment as needed  Activities of Daily Living      Permission Sought/Granted Permission sought to share information with : Case Manager, Other (comment)       Permission granted to share info w AGENCY: [facility] [employer] [facility]        Emotional Assessment   Attitude/Demeanor/Rapport: Engaged Affect (typically observed): Accepting Orientation: : Oriented to Self, Oriented to Place, Oriented to  Time, Oriented to Situation Alcohol / Substance Use: Not Applicable Psych Involvement: No (comment)  Admission diagnosis:  Acute CVA (cerebrovascular accident) [facility]) [I63.9] Patient Active Problem List   Diagnosis Date Noted   Bell's palsy 11/18/2022   At risk for polypharmacy 06/11/2022   Unresponsiveness 03/27/2020   Acute metabolic encephalopathy 03/27/2020   Unresponsive 03/27/2020   Acute hypercapnic respiratory failure (HCC) 03/27/2020   Change in mental status 02/08/2020   Pressure injury of skin 10/23/2019   Fall    HTN (hypertension) 10/20/2019   Diabetic ulcer of left heel associated with type  2 diabetes mellitus, limited to breakdown of skin (HCC) 09/11/2019   Right foot infection 12/19/2018   Acute encephalopathy 07/06/2018   Anxiety 06/10/2017   Chest pain with moderate risk for cardiac etiology 06/09/2017   UTI (urinary tract infection) 05/19/2017   CAD S/P percutaneous coronary angioplasty 05/19/2017   Chronic narcotic use 05/19/2017   Cellulitis in diabetic foot (HCC) 05/19/2017   Opioid overdose (HCC) 02/23/2017   Altered mental status 02/23/2017   Acute colitis 01/19/2017   Sepsis (HCC) 01/17/2017   Cellulitis of fourth toe of right foot  04/10/2016   Somnolence 04/09/2016   Cellulitis 04/03/2016   Hematoma of right parietal scalp 02/23/2016   Multiple falls 02/23/2016   Physical debility 02/23/2016   Type II diabetes mellitus, uncontrolled 02/23/2016   Syncope and collapse 02/23/2016   Polypharmacy 02/23/2016   Osteomyelitis of toe (HCC) 01/23/2016   Rhabdomyolysis 12/07/2015   ARF (acute renal failure) (HCC) 11/29/2015   Hypotension 11/29/2015   Chronic ischemic heart disease 11/27/2015   Diabetic osteomyelitis (HCC) 11/07/2015   Angina effort 11/07/2015   Osteomyelitis (HCC) 11/07/2015   Anemia due to chronic blood loss 06/14/2015   Generalized anxiety disorder 06/14/2015   Lumbago with sciatica 06/14/2015   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   S/P cardiac catheterization 06/04/2015   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 [place] [address] Phone: [phone] Fax: [phone]  [facility] [place], [place] - [address] [place] [address] Phone: [phone] Fax: [phone]  [facility] - [place], [place] - [address] [place] [address] Phone: [phone] Fax: [phone]     Social Determinants of Health (SDOH) Interventions    Readmission Risk Interventions    06/13/2022   10:24 AM  Readmission Risk Prevention Plan  Post Dischage Appt Complete  Medication  Screening Complete  Transportation Screening Complete

## 2022-11-18 NOTE — ED Notes (Signed)
Pt in bed, resps even and unlabored, pt states that she didn't like the meal provided, pt requests something easier to chew. Attempting to call dietary for alternative food

## 2022-11-18 NOTE — Evaluation (Signed)
Physical Therapy Evaluation Patient Details Name: Jessica Donovan MRN: [id number] DOB: [birth year] Today's Date: 11/18/2022  History of Present Illness  Pt is a 76 year old female presenting to the ED with for evaluation of a possible stroke- pt presents with R arm heaviness, difficulty ambulating to the bathroom, left-sided facial droop and also noted she had slurred speech since 11/13/22;  CT head shows no acute abnormality.  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  Pt was able to ambulate ~112ft on room air with vitals staying WNL and only mild c/o fatigue.  Overall showed good confidence with basic mobility and reports feeling safe to return home and though she is not at baseline feels close apart from the Bell's Palsy changes.  Will benefit from continued PT to address functional limitations and to improve safety and independence in the home.      Recommendations for follow up therapy are one component of a multi-disciplinary discharge planning process, led by the attending physician.  Recommendations may be updated based on patient status, additional functional criteria and insurance authorization.  Follow Up Recommendations Home health PT      Assistance Recommended at Discharge Intermittent Supervision/Assistance  Patient can return home with the following  Assist for transportation;Assistance with cooking/housework;A little  help with bathing/dressing/bathroom    Equipment Recommendations None recommended by PT  Recommendations for Other Services       Functional Status Assessment Patient has had a recent decline in their functional status and demonstrates the ability to make significant improvements in function in a reasonable and predictable amount of time.     Precautions / Restrictions Precautions Precautions: Fall Restrictions Weight Bearing Restrictions: No      Mobility  Bed Mobility               General bed mobility comments: seated pre/post session    Transfers Overall transfer level: Modified independent Equipment used: Rollator (4 wheels) Transfers: Sit to/from Stand Sit to Stand: Modified independent (Device/Increase time)           General transfer comment: Pt able to rise from ED stretcher with relative ease    Ambulation/Gait Ambulation/Gait assistance: Supervision Gait Distance (Feet): 150 Feet Assistive device: Rollator (4 wheels)         General Gait Details: Adjusted pt's 4WW height to more appropriate level after initially seeing her positioning when them at their prior level. PT notes much improved biomechanical position as well as pt report of improved confidence/control with newly set handles.  Stairs            Wheelchair Mobility    Modified Rankin (Stroke Patients Only)       Balance Overall balance assessment: Needs assistance Sitting-balance support: Feet supported Sitting balance-Leahy Scale: Good     Standing balance support: Bilateral upper extremity supported, During functional activity, Reliant on assistive device for balance Standing balance-Leahy Scale: Fair  Pertinent Vitals/Pain Pain Assessment Pain Assessment: No/denies pain    Home Living Family/patient expects to be discharged to:: Private residence Living Arrangements: Alone Available Help at Discharge: Available  PRN/intermittently;Friend(s) (home health aide 3x 3hr/week) Type of Home: Apartment Home Access: Level entry       Home Layout: One level Home Equipment: Rollator (4 wheels);BSC/3in1;Tub bench      Prior Function Prior Level of Function : Independent/Modified Independent;Needs assist       Physical Assist : ADLs (physical)   ADLs (physical): IADLs Mobility Comments: amb with rollator household distances, increased difficutlies with communtiy amb- uses scooter in grocery store ADLs Comments: MOD I in ADL, assist from aid with IADLs (driving, cooking, cleaning, grocery shopping)     Hand Dominance   Dominant Hand: Right    Extremity/Trunk Assessment   Upper Extremity Assessment Upper Extremity Assessment: Generalized weakness;Overall [facility] for tasks assessed (apart from chronic L RTC limitations)    Lower Extremity Assessment Lower Extremity Assessment: Generalized weakness;Overall [facility] for tasks assessed    Cervical / Trunk Assessment Cervical / Trunk Assessment: Kyphotic  Communication   Communication: HOH (R ear nearly deaf)  Cognition Arousal/Alertness: Awake/alert Behavior During Therapy: WFL for tasks assessed/performed Overall Cognitive Status: Within Functional Limits for tasks assessed                                          General Comments General comments (skin integrity, edema, etc.): Pt's HR stayed below 120t/o session, including during prolonged ambulation bout    Exercises     Assessment/Plan    PT Assessment Patient needs continued PT services  PT Problem List Decreased activity tolerance;Decreased balance;Decreased mobility;Decreased range of motion;Decreased strength;Decreased safety awareness;Decreased knowledge of use of DME       PT Treatment Interventions DME instruction;Gait training;Functional mobility training;Therapeutic exercise;Therapeutic activities;Balance training;Patient/family education    PT Goals (Current  goals can be found in the Care Plan section)  Acute Rehab PT Goals Patient Stated Goal: go home today PT Goal Formulation: With patient Time For Goal Achievement: 12/01/22 Potential to Achieve Goals: Good    Frequency Min 2X/week     Co-evaluation               AM-PAC PT "6 Clicks" Mobility  Outcome Measure Help needed turning from your back to your side while in a flat bed without using bedrails?: A Little Help needed moving from lying on your back to sitting on the side of a flat bed without using bedrails?: A Little Help needed moving to and from a bed to a chair (including a wheelchair)?: A Little Help needed standing up from a chair using your arms (e.g., wheelchair or bedside chair)?: A Little Help needed to walk in hospital room?: A Little Help needed climbing 3-5 steps with a railing? : A Little 6 Click Score: 18    End of Session Equipment Utilized During Treatment: Gait belt Activity Tolerance: Patient tolerated treatment well Patient left: with bed alarm set;with call bell/phone within reach Nurse Communication: Mobility status PT Visit Diagnosis: Difficulty in walking, not elsewhere classified (R26.2);Muscle weakness (generalized) (M62.81)    Time: 5093-2671 PT Time Calculation (min) (ACUTE ONLY): 28 min   Charges:   PT Evaluation $PT Eval Low Complexity: 1 Low PT Treatments $Gait Training: 8-22 mins        Malachi Pro, DPT 11/18/2022, 2:53 PM

## 2022-11-18 NOTE — Assessment & Plan Note (Addendum)
Patient has right-sided Bell's palsy, stroke has been ruled out. Prednisone 60 mg daily x7 days with Protonix for GI protection Valacyclovir 1000 mg 3 times daily x7 days Lubricating eyedrops Tape eye shut at nights for sleep to prevent drying

## 2022-11-18 NOTE — Consult Note (Addendum)
I was asked to consult on this patient for concern for stroke.  Her MRI of the brain is negative. I briefly examined the patient.  She has clear signs of Bell's palsy on her examination with LMN pattern weakness on the face, lid lag and inability to wrinkle her forehead or close her right eyelid. Symptoms have been going on since Friday. No need for stroke workup at this time as her clinical exam and imaging are not consistent with stroke. She has Bell's palsy of the right side-agree with Bell's palsy treatment Follow-up with outpatient neurology in 8 to 12 weeks. Plan was relayed to Dr. Nelson Chimes  -- Milon Dikes, MD Neurologist [facility] Pager: [phone] This is a no charge note

## 2022-11-18 NOTE — Progress Notes (Signed)
PHARMACIST - PHYSICIAN COMMUNICATION  CONCERNING:  Enoxaparin (Lovenox) for DVT Prophylaxis    RECOMMENDATION: Patient was prescribed enoxaprin 40mg  q24 hours for VTE prophylaxis.   Filed Weights   11/18/22 0412  Weight: 89.9 kg (198 lb 3.1 oz)    Body mass index is 31.99 kg/m.  Estimated Creatinine Clearance: 60 mL/min (by C-G formula based on SCr of 0.9 mg/dL).   Based on [facility] policy patient is candidate for enoxaparin 0.5mg /kg TBW SQ every 24 hours based on BMI being >30.  DESCRIPTION: Pharmacy has adjusted enoxaparin dose per [facility] policy.  Patient is now receiving enoxaparin 0.5 mg/kg every 24 hours   [facility], PharmD, [facility] 11/18/2022 4:14 AM

## 2022-11-18 NOTE — Assessment & Plan Note (Signed)
Patient denies chest pain, EKG nonacute and troponin negative Continue aspirin, atorvastatin, Imdur, lisinopril and metoprolol

## 2022-11-18 NOTE — Assessment & Plan Note (Signed)
Continue basal insulin with sliding scale coverage Lyrica for neuropathic pain

## 2022-11-18 NOTE — ED Notes (Signed)
Pt satting 88-91% in bed, md notified

## 2022-11-18 NOTE — ED Notes (Signed)
Pt satting 90-91% on room air, md notified

## 2022-11-18 NOTE — Assessment & Plan Note (Signed)
BP controlled. Continue lisinopril and metoprolol 

## 2022-11-18 NOTE — Evaluation (Signed)
Occupational Therapy Evaluation Patient Details Name: Jessica Donovan MRN: [id number] DOB: 10/15/1946 Today's Date: 11/18/2022   History of Present Illness Pt is a 76 year old female presenting to the ED with for evaluation of a possible stroke- pt presents with R arm heaviness, difficulty ambulating to the bathroom, left-sided facial droop and also noted she had slurred speech since 11/13/22;  CT head shows no acute abnormality.  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 Pt is alert and oriented x4, impaired hearing on the R side (pt reports she is unable to hear at all). PTA pt reports MOD I with ADL, assist for IADL with home health aid coming 3x per week for 3 hrs a day. Pt presents with deficits in strength, endurance, activity tolerance affecting safe and optimal ADL completion. Pt BUE strength appears symmetrical and WFL, Bed mobility completed with supervision, STS with supervision, amb to bathroom with rollator with supervision, toileting with supervision, grooming tasks standing at sink with supervision. Intermittent vcs required throughout for safe use of rollator, PT present and end of eval for hand off, adjusts rollator. Recommend HHOT following discharge to address functional deficits. Pt is left in care of PT all needs met. OT will  follow acutely.      Recommendations for follow up therapy are one component of a multi-disciplinary discharge planning process, led by the attending physician.  Recommendations may be updated based on patient status, additional functional criteria and insurance authorization.   Follow Up Recommendations  Home health OT     Assistance Recommended at Discharge Intermittent Supervision/Assistance  Patient can return home with the following A little help with bathing/dressing/bathroom;Assistance with cooking/housework    Functional Status Assessment  Patient has had a recent decline in their functional status and demonstrates the ability to make significant improvements in function in a reasonable and predictable amount of time.  Equipment Recommendations  Other (comment);BSC/3in1 (HH assessment for wheeled mobility for community distances, improved indep durnig MRADLs; pt declines 3 in 1, says she has one in storage not sure if she can find it)    Recommendations for Other Services       Precautions / Restrictions Precautions Precautions: Fall Restrictions Weight Bearing Restrictions: No      Mobility Bed Mobility Overal bed mobility: Needs Assistance Bed Mobility: Supine to Sit     Supine to sit: Supervision          Transfers Overall transfer level: Needs assistance Equipment used: Rollator (4 wheels) Transfers: Sit to/from Stand Sit to Stand: Supervision           General transfer comment: from stretcher      Balance Overall balance assessment: Needs assistance Sitting-balance support: Feet supported Sitting balance-Leahy Scale: Good     Standing balance support: Bilateral upper extremity supported, During functional activity, Reliant on assistive device for balance Standing balance-Leahy Scale: Fair  ADL either performed or assessed with clinical judgement   ADL Overall ADL's : Needs  assistance/impaired Eating/Feeding: Set up;Sitting   Grooming: Wash/dry hands;Standing;Supervision/safety Grooming Details (indicate cue type and reason): sink level             Lower Body Dressing: Supervision/safety Lower Body Dressing Details (indicate cue type and reason): brief Toilet Transfer: Supervision/safety;Min Field seismologist (4 wheels) Toilet Transfer Details (indicate cue type and reason): cueing for safety Toileting- Clothing Manipulation and Hygiene: Supervision/safety;Sit to/from stand       Functional mobility during ADLs: Supervision/safety;Rollator (4 wheels) (household distances to bathroom, vcs for technique with rollator)       Vision Patient Visual Report: Other (comment);Eye fatigue/eye pain/headache (bothered by bright lights) Vision Assessment?: No apparent visual deficits Additional Comments: will continue to assess     Perception     Praxis      Pertinent Vitals/Pain Pain Assessment Pain Assessment: No/denies pain     Hand Dominance Right   Extremity/Trunk Assessment Upper Extremity Assessment Upper Extremity Assessment: Overall WFL for tasks assessed   Lower Extremity Assessment Lower Extremity Assessment: Generalized weakness   Cervical / Trunk Assessment Cervical / Trunk Assessment: Kyphotic   Communication Communication Communication: No difficulties   Cognition Arousal/Alertness: Awake/alert Behavior During Therapy: WFL for tasks assessed/performed Overall Cognitive Status: Within Functional Limits for tasks assessed                                       General Comments  HR up to 145 briefly with mobility to bathroom    Exercises Other Exercises Other Exercises: edu re: role of OT, role of rehab, discharge recommendations, home safety, falls prevention, DME use   Shoulder Instructions      Home Living Family/patient expects to be discharged to:: Private residence Living Arrangements:  Alone Available Help at Discharge: Available PRN/intermittently;Friend(s) (has a home health aid 3x a week 3 hrs a day) Type of Home: Apartment Home Access: Level entry           Bathroom Shower/Tub: Teacher, early years/pre: Handicapped height Bathroom Accessibility: No   Home Equipment: Rollator (4 wheels);BSC/3in1;Tub bench          Prior Functioning/Environment Prior Level of Function : Independent/Modified Independent;Needs assist       Physical Assist : ADLs (physical)   ADLs (physical): IADLs Mobility Comments: amb with rollator household distances, increased difficutlies with communtiy amb- uses scooter in grocery store ADLs Comments: MOD I in ADL, assist from aid with IADLs (driving, cooking, cleaning, grocery shopping)        OT Problem List: Decreased strength;Decreased activity tolerance;Decreased knowledge of use of DME or AE;Impaired balance (sitting and/or standing)      OT Treatment/Interventions: Self-care/ADL training;Patient/family education;Therapeutic exercise;Balance training;Energy conservation;Therapeutic activities;DME and/or AE instruction    OT Goals(Current goals can be found in the care plan section) Acute Rehab OT Goals Patient Stated Goal: go home OT Goal Formulation: With patient Time For Goal Achievement: 12/02/22 Potential to Achieve Goals: Good ADL Goals Pt Will Perform Grooming: with modified independence;sitting Pt Will Perform Lower Body Dressing: with modified independence;sit to/from stand Pt Will Transfer to Toilet: with modified independence;ambulating Pt Will Perform Toileting - Clothing Manipulation and hygiene: with modified independence  OT Frequency: Min 2X/week    Co-evaluation              AM-PAC OT "6 Clicks"  Daily Activity     Outcome Measure Help from another person eating meals?: None Help from another person taking care of personal grooming?: None Help from another person toileting, which  includes using toliet, bedpan, or urinal?: None Help from another person bathing (including washing, rinsing, drying)?: A Little Help from another person to put on and taking off regular upper body clothing?: None Help from another person to put on and taking off regular lower body clothing?: A Little 6 Click Score: 22   End of Session Equipment Utilized During Treatment: Rollator (4 wheels)  Activity Tolerance: Patient tolerated treatment well Patient left: Other (comment) (in care of PT at edge of bed)  OT Visit Diagnosis: Unsteadiness on feet (R26.81);Muscle weakness (generalized) (M62.81)                Time: 7737-3668 OT Time Calculation (min): 20 min Charges:  OT General Charges $OT Visit: 1 Visit OT Evaluation $OT Eval Low Complexity: 1 Low  Shanon Payor, OTD OTR/L  11/18/22, 1:59 PM

## 2022-11-18 NOTE — Hospital Course (Addendum)
Taken from H&P.  Jessica Donovan is a 76 y.o. 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  Patient states she awoke on 11/24 to find that her right arm felt very heavy along with left-sided facial droop, she also noted some transient slurred speech.  Having some headaches but denies any dizziness or falls.  Patient has persistent of the symptoms since then.  Rest of the ROS was negative.  ED course and data review: Vitals within normal limits.  Blood work unremarkable.  EtOH less than 10. EKG, NSR  with evidence of old anterior infarct.  CT head shows no acute abnormality.  Does show mild atrophy and mild chronic microvascular ischemic change in the white matter.  11/29: Remained hemodynamically stable, MRI was negative for any acute intracranial abnormality.  Carotid ultrasound with mild multifocal atherosclerosis disease without hemodynamically significant stenosis. Neurology was also consulted and patient was found to have Bell's palsy.  No need for further stroke workup.  She was placed on prednisone and valacyclovir. Stroke has been ruled out.  Patient is on multiple sedating medications and need to work up with PCP to be weaned off few.  She is being discharged on prednisone and valacyclovir for 7 days and will follow-up with PCP for further recommendations. Home health PT and OT were also ordered.

## 2022-11-18 NOTE — Discharge Summary (Signed)
Physician Discharge Summary   Patient: Jessica Donovan MRN: [id number] DOB: July 25, 1946  Admit date:     11/17/2022  Discharge date: 11/18/22  Discharge Physician: Lorella Nimrod   PCP: Leonel Ramsay, MD   Recommendations at discharge:  Please obtain CBC and BMP in 1 week Follow-up with primary care provider within a week Follow-up with outpatient neurology   Discharge Diagnoses: Principal Problem:   Bell's palsy Active Problems:   Polypharmacy   CAD S/P percutaneous coronary angioplasty   Chronic pain syndrome   Generalized anxiety disorder   Type 2 diabetes mellitus with diabetic neuropathy ([place])   HTN (hypertension)   Hospital Course: Taken from H&P.  Jessica Donovan is a 76 y.o. 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  Patient states she awoke on 11/24 to find that her right arm felt very heavy along with left-sided facial droop, she also noted some transient slurred speech.  Having some headaches but denies any dizziness or falls.  Patient has persistent of the symptoms since then.  Rest of the ROS was negative.  ED course and data review: Vitals within normal limits.  Blood work unremarkable.  EtOH less than 10. EKG, NSR  with evidence of old anterior infarct.  CT head shows no acute abnormality.  Does show mild atrophy and mild chronic microvascular ischemic change in the white matter.  11/29: Remained hemodynamically stable, MRI was negative for any acute intracranial abnormality.  Carotid ultrasound with mild multifocal atherosclerosis disease without hemodynamically  significant stenosis. Neurology was also consulted and patient was found to have Bell's palsy.  No need for further stroke workup.  She was placed on prednisone and valacyclovir. Stroke has been ruled out.  Patient is on multiple sedating medications and need to work up with PCP to be weaned off few.  She is being discharged on prednisone and valacyclovir for 7 days and will follow-up with PCP for further recommendations. Home health PT and OT were also ordered.     Assessment and Plan: * Bell's palsy Patient has right-sided Bell's palsy, stroke has been ruled out. Prednisone 60 mg daily x7 days with Protonix for GI protection Valacyclovir 1000 mg 3 times daily x7 days Lubricating eyedrops Tape eye shut at nights for sleep to prevent drying   HTN (hypertension) BP controlled.  Continue lisinopril and metoprolol  Type 2 diabetes mellitus with diabetic neuropathy (HCC) Continue basal insulin with sliding scale coverage Lyrica for neuropathic pain  Generalized anxiety disorder Continue mirtazapine, trazodone and venlafaxine  Chronic pain syndrome Continue Lyrica and as needed Flexeril  CAD S/P percutaneous coronary angioplasty Patient denies chest pain, EKG nonacute and troponin negative Continue aspirin, atorvastatin, Imdur, lisinopril and metoprolol  Polypharmacy Patient on multiple sedating meds including Lyrica, Flexeril, trazodone, mirtazapine, MS IR Monitor mentation Fall precautions and out of bed with assistance only   Pain control - [place] Controlled Substance Reporting System database was reviewed. and patient was instructed, not to drive, operate heavy machinery, perform activities at heights, swimming or participation in water activities or provide baby-sitting services while on Pain, Sleep and Anxiety Medications; until their outpatient Physician has advised to do so again. Also recommended to not to  take more than prescribed Pain, Sleep and Anxiety  Medications.  Consultants: Neurology Procedures performed: None Disposition: Home health Diet recommendation:  Discharge Diet Orders (From admission, onward)     Start     Ordered   11/18/22 0000  Diet - low sodium heart healthy        11/18/22 1347           Cardiac and Carb modified diet DISCHARGE MEDICATION: Allergies as of 11/18/2022       Reactions   Amoxicillin Diarrhea   Ampicillin Nausea And Vomiting, Other (See Comments)   Did it involve swelling of the face/tongue/throat, SOB, or low BP? Yes Did it involve sudden or severe rash/hives, skin peeling, or any reaction on the inside of your mouth or nose? No Did you need to seek medical attention at a hospital or doctor's office? No When did it last happen? >10 years If all above answers are "NO", may proceed with cephalosporin use.   Augmentin [amoxicillin-pot Clavulanate] Diarrhea   Bactrim [sulfamethoxazole-trimethoprim] Other (See Comments)   N/V/D   Codeine Nausea And Vomiting   Eggs Or Egg-derived Products    ALLergic to cook or raw egg only. Food with egg ingredient is okay for patient.    Influenza Vaccines Other (See Comments)   Reaction:  Caused pt to pass out    Influenza Virus Vaccine Other (See Comments)   Reaction: Passed out for 12 days   Methadone Hives, Itching   Oxycodone-acetaminophen Nausea And Vomiting   Percocet [oxycodone-acetaminophen] Nausea And Vomiting   Sulfa Antibiotics Other (See Comments)   Reaction: unknown   Tetanus Antitoxin Swelling, Other (See Comments)   Reaction: injection site swelling   Tetanus Toxoids Swelling, Other (See Comments)   Reaction:  Swelling at injection site   Penicillin G Rash   Has patient had a PCN reaction causing immediate rash, facial/tongue/throat swelling, SOB or lightheadedness with hypotension: Yes Has patient had a PCN reaction causing severe rash involving mucus membranes or skin necrosis: No Has patient had a PCN reaction that required  hospitalization: No Has patient had a PCN reaction occurring within the last 10 years: No If all of the above answers are "NO", then may proceed with Cephalosporin use..   Tetracyclines & Related Rash        Medication List     TAKE these medications    acetaminophen 500 MG tablet Commonly known as: TYLENOL Take 1,000 mg by mouth every 8 (eight) hours as needed for mild pain or headache.   ALPRAZolam 0.5 MG tablet Commonly known as: XANAX Take 0.5 mg by mouth 4 (four) times daily as needed for anxiety.   AMBULATORY NON FORMULARY MEDICATION Compounded estrogen  Apply 1 gram vaginally M,W, Friday [facility]   aspirin 81 MG tablet Take 81 mg by mouth daily.   atorvastatin 20 MG tablet Commonly known as: LIPITOR Take 20 mg by mouth daily.   celecoxib 200 MG capsule Commonly known as: CELEBREX Take 200 mg by mouth daily.   cyclobenzaprine 10 MG tablet Commonly known as: FLEXERIL Take 10 mg by mouth 3 (three) times daily as needed.   HAIR SKIN AND NAILS FORMULA PO Take by mouth daily. 2 gummies daily   insulin aspart 100 UNIT/ML injection Commonly known as: novoLOG Inject 3-15 Units into the skin 3 (three) times daily with meals as needed for high blood sugar. Pt uses as needed per sliding scale:    Less than 140:  0 units  140-180:  3 units 181-220:  4 units 221- 260:  6 units 261- 320:  8 units 321-360:  10 units 361-400:  12 units Greater than 400:  15 units   isosorbide mononitrate 30 MG 24 hr tablet Commonly known as: IMDUR Take 30 mg by mouth daily.   lisinopril 20 MG tablet Commonly known as: ZESTRIL Take 20 mg by mouth daily.   metoprolol tartrate 25 MG tablet Commonly known as: LOPRESSOR Take 1 tablet (25 mg total) by mouth 2 (two) times daily.   mirtazapine 15 MG tablet Commonly known as: REMERON Take 15 mg by mouth at bedtime.   morphine 15 MG tablet Commonly known as: MSIR Take 15 mg by mouth 2 (two) times daily.    nitrofurantoin (macrocrystal-monohydrate) 100 MG capsule Commonly known as: MACROBID Take 100 mg by mouth daily.   omeprazole 40 MG capsule Commonly known as: PRILOSEC Take 1 capsule (40 mg total) by mouth daily.   polyvinyl alcohol 1.4 % ophthalmic solution Commonly known as: LIQUIFILM TEARS Place 1 drop into the right eye 4 (four) times daily.   predniSONE 20 MG tablet Commonly known as: DELTASONE Take 3 tablets (60 mg total) by mouth daily with breakfast for 7 days. Start taking on: November 19, 2022   pregabalin 100 MG capsule Commonly known as: LYRICA Take 100 mg by mouth 3 (three) times daily.   Toujeo SoloStar 300 UNIT/ML Solostar Pen Generic drug: insulin glargine (1 Unit Dial) Inject 60 Units into the skin every morning.   traZODone 100 MG tablet Commonly known as: DESYREL Take 100 mg by mouth at bedtime.   valACYclovir 1000 MG tablet Commonly known as: VALTREX Take 1 tablet (1,000 mg total) by mouth 3 (three) times daily for 7 days.   venlafaxine XR 75 MG 24 hr capsule Commonly known as: EFFEXOR-XR Take 75 mg by mouth 2 (two) times daily.   Vitamin D (Ergocalciferol) 1.25 MG (50000 UNIT) Caps capsule Commonly known as: DRISDOL Take 50,000 Units by mouth every Sunday.        Follow-up Information     Leonel Ramsay, MD. Schedule an appointment as soon as possible for a visit in 1 week(s).   Specialty: Infectious Diseases Contact information: [place] [address] [phone]                Discharge Exam: Danley Danker Weights   11/18/22 0412  Weight: 89.9 kg   General.  Obese elderly lady, in no acute distress. Pulmonary.  Lungs clear bilaterally, normal respiratory effort. CV.  Regular rate and rhythm, no JVD, rub or murmur. Abdomen.  Soft, nontender, nondistended, BS positive. CNS.  Alert and oriented .  Right facial droop with flattening of forehead creases. no other focal neurologic deficit. Extremities.  No  edema, no cyanosis, pulses intact and symmetrical. Psychiatry.  Judgment and insight appears normal.   Condition at discharge: stable  The results of significant diagnostics from this hospitalization (including imaging, microbiology, ancillary and laboratory) are listed below for reference.   Imaging Studies: US Carotid Bilateral (at [facility] and [facility] only)  Result Date: 11/18/2022 CLINICAL DATA:  stroke EXAM: BILATERAL CAROTID DUPLEX ULTRASOUND TECHNIQUE: Pearline Cables scale imaging, color Doppler and duplex ultrasound were performed of bilateral carotid and vertebral arteries in the neck. COMPARISON:  02/24/2016 FINDINGS: Criteria: Quantification of carotid stenosis is based on velocity parameters that correlate the residual internal carotid diameter with NASCET-based stenosis levels, using the diameter of the distal internal carotid lumen as the denominator for stenosis  measurement. The following velocity measurements were obtained: RIGHT ICA: 64 cm/sec CCA: 81 cm/sec SYSTOLIC ICA/CCA RATIO:  1.1 ECA: 111 cm/sec LEFT ICA: 70 cm/sec CCA: [id number] cm/sec SYSTOLIC ICA/CCA RATIO:  1.2 ECA: 153 cm/sec RIGHT CAROTID ARTERY: Mild multifocal atherosclerotic disease RIGHT VERTEBRAL ARTERY:  Antegrade flow LEFT CAROTID ARTERY:  Mild multifocal atherosclerotic disease LEFT VERTEBRAL ARTERY:  Antegrade flow IMPRESSION: Mild multifocal atherosclerotic disease without hemodynamically significant stenosis of internal carotid arteries. Electronically Signed   By: Ulyses Jarred M.D.   On: 11/18/2022 03:28   MR BRAIN WO CONTRAST  Result Date: 11/18/2022 CLINICAL DATA:  Acute neurologic deficit EXAM: MRI HEAD WITHOUT CONTRAST TECHNIQUE: Multiplanar, multiecho pulse sequences of the brain and surrounding structures were obtained without intravenous contrast. COMPARISON:  None Available. FINDINGS: Brain: No acute infarct, mass effect or extra-axial collection. No acute or chronic hemorrhage. Minimal multifocal hyperintense T2-weight  signal within the white matter. The midline structures are normal. Vascular: Major flow voids are preserved. Skull and upper cervical spine: Normal calvarium and skull base. Visualized upper cervical spine and soft tissues are normal. Sinuses/Orbits:No paranasal sinus fluid levels or advanced mucosal thickening. No mastoid or middle ear effusion. Normal orbits. IMPRESSION: No acute intracranial abnormality. Electronically Signed   By: Ulyses Jarred M.D.   On: 11/18/2022 02:00   CT HEAD WO CONTRAST  Result Date: 11/17/2022 CLINICAL DATA:  Seventh nerve paresis. Facial paralysis/weakness. Decreased since a shin right body. EXAM: CT HEAD WITHOUT CONTRAST TECHNIQUE: Contiguous axial images were obtained from the base of the skull through the vertex without intravenous contrast. RADIATION DOSE REDUCTION: This exam was performed according to the departmental dose-optimization program which includes automated exposure control, adjustment of the mA and/or kV according to patient size and/or use of iterative reconstruction technique. COMPARISON:  CT head 05/09/2021 FINDINGS: Brain: No evidence of acute infarction, hemorrhage, hydrocephalus, extra-axial collection or mass lesion/mass effect. Mild atrophy. Mild white matter hypodensity bilaterally unchanged from the prior study. Vascular: Negative for hyperdense vessel Skull: Negative Sinuses/Orbits: Paranasal sinuses clear. Bilateral cataract extraction Other: None IMPRESSION: No acute abnormality. Mild atrophy and mild chronic microvascular ischemic change in the white matter. Electronically Signed   By: Franchot Gallo M.D.   On: 11/17/2022 16:16    Microbiology: Results for orders placed or performed during the hospital encounter of 06/11/22  Blood culture (routine x 2)     Status: None   Collection Time: 06/11/22  1:41 PM   Specimen: BLOOD  Result Value Ref Range Status   Specimen Description BLOOD RIGHT FA  Final   Special Requests   Final    BOTTLES DRAWN  AEROBIC AND ANAEROBIC Blood Culture adequate volume   Culture   Final    NO GROWTH 5 DAYS Performed at [facility], [address], [place], [place] [address]    Report Status 06/16/2022 FINAL  Final  Blood culture (routine x 2)     Status: None   Collection Time: 06/11/22  4:04 PM   Specimen: BLOOD  Result Value Ref Range Status   Specimen Description BLOOD RIGHT ANTECUBITAL  Final   Special Requests   Final    BOTTLES DRAWN AEROBIC AND ANAEROBIC Blood Culture adequate volume   Culture   Final    NO GROWTH 5 DAYS Performed at [facility], [address], [place], [place] [address]    Report Status 06/16/2022 FINAL  Final  Aerobic/Anaerobic Culture w Gram Stain (surgical/deep wound)     Status: None   Collection  Time: 06/12/22 11:05 AM   Specimen: Bone; Tissue  Result Value Ref Range Status   Specimen Description   Final    BONE Performed at [facility], [address] [place], [place] [address]    Special Requests LEFT GREAT TOE  Final   Gram Stain NO WBC SEEN NO ORGANISMS SEEN   Final   Culture   Final    RARE CORYNEBACTERIUM STRIATUM Standardized susceptibility testing for this organism is not available. NO ANAEROBES ISOLATED Performed at [facility], [place] [address] [place],  [address]    Report Status 06/16/2022 FINAL  Final    Labs: CBC: Recent Labs  Lab 11/17/22 1521  WBC 10.5  NEUTROABS 5.9  HGB 13.1  HCT 41.2  MCV 98.8  PLT [id number]   Basic Metabolic Panel: Recent Labs  Lab 11/17/22 1521  NA 139  K 4.6  CL 104  CO2 28  GLUCOSE 91  BUN 24*  CREATININE 0.90  CALCIUM 9.5   Liver Function Tests: Recent Labs  Lab 11/17/22 1521  AST 24  ALT 13  ALKPHOS 72  BILITOT 0.4  PROT 7.0  ALBUMIN 4.2   CBG: Recent Labs  Lab 11/18/22 0714 11/18/22 1140  GLUCAP 280* 349*    Discharge time spent: greater than 30 minutes.  This record has been created using Systems analyst. Errors  have been sought and corrected,but may not always be located. Such creation errors do not reflect on the standard of care.   Signed: Lorella Nimrod, MD [facility] 11/18/2022

## 2022-11-18 NOTE — Assessment & Plan Note (Signed)
Patient with right-sided deficits with NIH of 4 arriving outside tPA window given onset of symptoms on 11/24 Daily ASA 81mg  daily, add Plavix 75mg  daily x 3 weeks then monotherapy with Plavix thereafter Continue atorvastatin and follow-up lipid profile MRI, telemetry, echocardiogram PT OT and speech therapy consults Neurology consult to follow

## 2022-11-18 NOTE — TOC Transition Note (Signed)
Transition of Care [facility]) - CM/SW Discharge Note   Patient Details  Name: Jessica Donovan MRN: [id number] Date of Birth: [birth year]  Transition of Care [facility] CM/SW Contact:  Allayne Butcher, RN Phone Number: 11/18/2022, 3:55 PM   Clinical Narrative:     Safe Transport arranged to get patient home this afternoon.  They should arrive soon to pick patient up, they will provide door to door service.  When they arrive at the ED they will call the main number to let staff know they have arrived.   Final next level of care: Home/Self Care Barriers to Discharge: Barriers Resolved   Patient Goals and CMS Choice Patient states their goals for this hospitalization and ongoing recovery are:: Patient does not feel ready to go home, tired and hurting      Discharge Placement                       Discharge Plan and Services   Discharge Planning Services: CM Consult            DME Arranged: N/A DME Agency: NA       HH Arranged: Patient Refused HH          Social Determinants of Health (SDOH) Interventions     Readmission Risk Interventions    06/13/2022   10:24 AM  Readmission Risk Prevention Plan  Post Dischage Appt Complete  Medication Screening Complete  Transportation Screening Complete

## 2022-11-18 NOTE — ED Notes (Signed)
Pt in bed with eyes closed, pt arouses easily to verbal stim, pt satting 90s on 2L, increased O2 to 4L via [place] secondary to decreased O2 sat when sleeping.  Pt reports some back chronic back pain.

## 2022-11-18 NOTE — Assessment & Plan Note (Signed)
Patient on multiple sedating meds including Lyrica, Flexeril, trazodone, mirtazapine, MS IR Monitor mentation Fall precautions and out of bed with assistance only

## 2022-11-18 NOTE — Assessment & Plan Note (Signed)
Continue Lyrica and as needed Flexeril

## 2022-11-18 NOTE — ED Notes (Signed)
Pt in bed, pt ambulatory O2 sat 88-93 on room air, pt talking in full sentences, resps even and unlabored. Pt back into bed, pt satting 94% on room air.

## 2022-11-18 NOTE — ED Notes (Signed)
Awaiting safe transport for pt d/c home.

## 2022-11-18 NOTE — IPAL (Signed)
  Interdisciplinary Goals of Care Family Meeting   Date carried out: 11/18/2022  Location of the meeting: Bedside  Member's involved: Physician patient Durable Power of Attorney or acting medical decision maker: patient    Discussion: We discussed goals of care for Jessica Donovan .  Patient states she decided to be DNR a long time ago.  She states she suffers daily with chronic pain and would not want to be resuscitated  Code status: Full DNR  Disposition: Continue current acute care  Time spent for the meeting: 16    Jessica Baumann, MD  11/18/2022, 12:50 AM

## 2022-11-18 NOTE — ED Notes (Signed)
Orders received to d/c pt, attempting to get pt ride home with case management.

## 2022-11-18 NOTE — H&P (Signed)
History and Physical    Patient: Jessica Donovan PNT:[id number] DOB: 1946/06/17 DOA: 11/17/2022 DOS: the patient was seen and examined on 11/18/2022 PCP: Mick Sell, MD  Patient coming from: Home  Chief Complaint:  Chief Complaint  Patient presents with   Stroke Symptoms    HPI: Jessica Donovan is a 76 y.o. 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  Patient states she awoke on 11/24 to find that her right arm felt very heavy.  She had difficulty ambulating to the bathroom, and when she looked in the mirror she noted that she had a left-sided facial droop and also noted she had slurred speech.  She complains of a headache, but denies falling.  Her symptoms have not improved since noticed on 11/24.  She was previously in her usual state of health. ED course and data review: Vitals within normal limits.  Blood work unremarkable.  EtOH less than 10. EKG, personally reviewed and interpreted showing NSR at 58 with evidence of old anterior infarct.  CT head shows no acute abnormality.  Does show mild atrophy and mild chronic microvascular ischemic change in the white matter. Patient was given 324 mg chewable aspirin.  Hospitalist consulted for admission for stroke work-up   Review of Systems: As mentioned in the history of present illness. All other systems reviewed and are negative.  Past Medical History:  Diagnosis Date   Anxiety    Chronic back pain    Collagen vascular disease (HCC)    Coronary artery disease    a. s/p PCI/DES to dRCA & mLCx in 2014 with repeat LHC in 10/2015 showing  patent stents   Diabetes mellitus without complication (HCC)    Hypertension    Low back pain    Myocardial infarction (HCC)    Osteomyelitis of toe (HCC) 01/23/2016   Stroke [facility])    Past Surgical History:  Procedure Laterality Date   AMPUTATION TOE Left 11/10/2015   Procedure: AMPUTATION TOE;  Surgeon: Linus Galas, MD;  Location: [facility] ORS;  Service: Podiatry;  Laterality: Left;   AMPUTATION TOE Left 01/24/2016   Procedure: AMPUTATION TOE (2nd mpj);  Surgeon: Linus Galas, DPM;  Location: [facility] ORS;  Service: Podiatry;  Laterality: Left;   AMPUTATION TOE Right 12/21/2018   Procedure: 2ND TOE AMPUTATION WITH DEBRIDEMENT OF SOFT TISSUE;  Surgeon: Recardo Evangelist, DPM;  Location: [facility] ORS;  Service: Podiatry;  Laterality: Right;   AMPUTATION TOE Left 06/12/2022   Procedure: AMPUTATION TOE; PARTIAL AMPUTATION LEFT GREAT TOE;  Surgeon: Linus Galas, DPM;  Location: [facility] ORS;  Service: Podiatry;  Laterality: Left;   CARDIAC CATHETERIZATION N/A 11/12/2015   Procedure: Left Heart Cath and Coronary Angiography;  Surgeon: Marcina Millard, MD;  Location: [facility] INVASIVE CV LAB;  Service: Cardiovascular;  Laterality: N/A;   COLONOSCOPY WITH PROPOFOL N/A 07/20/2017   Procedure: COLONOSCOPY WITH PROPOFOL;  Surgeon: Wyline Mood, MD;  Location: [facility] ENDOSCOPY;  Service: Endoscopy;  Laterality: N/A;   JOINT REPLACEMENT     TOE AMPUTATION     Social History:  reports that she has never smoked. She has never used smokeless tobacco. She reports that she does not drink alcohol and does not use drugs.  Allergies  Allergen Reactions   Amoxicillin Diarrhea   Ampicillin Nausea And Vomiting and Other (See Comments)    Did it involve swelling of the face/tongue/throat, SOB, or low BP? Yes Did it involve sudden or severe rash/hives, skin peeling, or any reaction on the inside of your mouth or nose? No Did you need to seek medical attention at a hospital or doctor's office? No When did it last happen? >10 years If all  above answers are "NO", may proceed with cephalosporin use.    Augmentin [Amoxicillin-Pot Clavulanate] Diarrhea   Bactrim [Sulfamethoxazole-Trimethoprim] Other (See Comments)    N/V/D   Codeine Nausea And Vomiting   Eggs Or Egg-Derived Products     ALLergic to cook or raw egg only. Food with egg ingredient is okay for patient.    Influenza Vaccines Other (See Comments)    Reaction:  Caused pt to pass out    Influenza Virus Vaccine Other (See Comments)    Reaction: Passed out for 12 days   Methadone Hives and Itching   Oxycodone-Acetaminophen Nausea And Vomiting   Percocet [Oxycodone-Acetaminophen] Nausea And Vomiting   Sulfa Antibiotics Other (See Comments)    Reaction: unknown   Tetanus Antitoxin Swelling and Other (See Comments)    Reaction: injection site swelling   Tetanus Toxoids Swelling and Other (See Comments)    Reaction:  Swelling at injection site   Penicillin G Rash    Has patient had a PCN reaction causing immediate rash, facial/tongue/throat swelling, SOB or lightheadedness with hypotension: Yes Has patient had a PCN reaction causing severe rash involving mucus membranes or skin necrosis: No Has patient had a PCN reaction that required hospitalization: No Has patient had a PCN reaction occurring within the last 10 years: No If all of the above answers are "NO", then may proceed with Cephalosporin use..   Tetracyclines & Related Rash    Family History  Problem Relation Age of Onset   CAD Mother    CAD Father     Prior to Admission medications   Medication Sig Start Date End Date Taking? Authorizing Provider  acetaminophen (TYLENOL) 500 MG tablet Take 1,000 mg by mouth every 8 (eight) hours as needed for mild pain or headache.     [provider]  AMBULATORY NON FORMULARY MEDICATION Compounded estrogen  Apply 1 gram vaginally M,W, [facility] 01/28/21   Hollice Espy, MD  aspirin 81 MG tablet Take 81 mg by mouth daily.    [provider]  atorvastatin (LIPITOR) 20 MG tablet Take 20 mg by mouth daily.     [provider]  celecoxib (CELEBREX) 100 MG capsule Take 1 capsule (100 mg total) by mouth 2 (two) times daily as needed (home med). 06/24/22   Enzo Bi, MD  cyclobenzaprine (FLEXERIL) 10 MG tablet Take 10 mg by mouth 3 (three) times daily as needed.  02/12/20   [provider]  insulin aspart (NOVOLOG) 100 UNIT/ML injection Inject 3-15 Units into the skin 3 (three) times daily with meals as needed for high blood sugar. Pt uses as needed per sliding scale:    Less than 140:  0 units  140-180:  3 units 181-220:  4 units 221- 260:  6 units 261- 320:  8 units 321-360:  10 units 361-400:  12 units Greater than 400:  15 units    [provider]  isosorbide mononitrate (IMDUR) 30 MG 24 hr tablet Take 30 mg by mouth daily. 11/11/20   [provider]  lisinopril (ZESTRIL) 20 MG tablet Take 20 mg by mouth daily. 11/11/20   [provider]  metoprolol tartrate (LOPRESSOR) 25 MG tablet Take 1 tablet (25 mg total) by mouth 2 (two) times daily. 11/17/20 06/11/22  Donne , MD  mirtazapine (REMERON) 15 MG tablet Take 15 mg by mouth at bedtime.    [provider]  Multiple Vitamins-Minerals (HAIR SKIN AND NAILS FORMULA PO) Take by mouth daily. 2 gummies daily    [provider]  nitrofurantoin, macrocrystal-monohydrate, (MACROBID) 100 MG capsule Take 100 mg by mouth daily. 05/26/22   [provider]  omeprazole (PRILOSEC) 40 MG capsule Take 1 capsule (40 mg total) by mouth daily. 06/24/22   Enzo Bi, MD  pregabalin (LYRICA) 100 MG capsule Take 100 mg by mouth 3 (three) times daily. 06/11/22 06/11/23  [provider]  TOUJEO SOLOSTAR 300 UNIT/ML SOPN Inject 60 Units into the skin every morning. 08/08/19   [provider]  traZODone (DESYREL) 100 MG tablet Take 100 mg by mouth at bedtime.    [provider]  venlafaxine XR  (EFFEXOR-XR) 75 MG 24 hr capsule Take 75 mg by mouth 2 (two) times daily.    [provider]  Vitamin D, Ergocalciferol, (DRISDOL) 50000 units CAPS capsule Take 50,000 Units by mouth every Sunday.     [provider]    Physical Exam: Vitals:   11/17/22 1512  BP: 121/65  Pulse: 79  Resp: 18  Temp: 98.4 F (36.9 C)  TempSrc: Oral  SpO2: 93%   Physical Exam Vitals and nursing note reviewed.  Constitutional:      General: She is not in acute distress. HENT:     Head: Normocephalic and atraumatic.  Cardiovascular:     Rate and Rhythm: Normal rate and regular rhythm.     Heart sounds: Normal heart sounds.  Pulmonary:     Effort: Pulmonary effort is normal.     Breath sounds: Normal breath sounds.  Abdominal:     Palpations: Abdomen is soft.     Tenderness: There is no abdominal tenderness.  Neurological:     Cranial Nerves: Facial asymmetry present.     Comments: Patient appears to have complete right-sided facial droop.  Has blunting of frown lines.  Unable to close right eye completely. See pic below She endorses tearing from the right eye  No definite motor function deficit noted in the upper and lower extremities     Labs on Admission: I have personally reviewed following labs and imaging studies  CBC: Recent Labs  Lab 11/17/22 1521  WBC 10.5  NEUTROABS 5.9  HGB 13.1  HCT 41.2  MCV 98.8  PLT [id number]   Basic Metabolic Panel: Recent Labs  Lab 11/17/22 1521  NA 139  K 4.6  CL 104  CO2 28  GLUCOSE 91  BUN 24*  CREATININE 0.90  CALCIUM 9.5   GFR: CrCl cannot be calculated (Unknown ideal weight.). Liver Function Tests: Recent Labs  Lab 11/17/22 1521  AST 24  ALT 13  ALKPHOS 72  BILITOT 0.4  PROT 7.0  ALBUMIN 4.2   No results for input(s): "LIPASE", "AMYLASE" in the last 168 hours. No results for input(s): "AMMONIA" in the last 168 hours. Coagulation Profile: Recent Labs  Lab 11/17/22 1521  INR 1.0   Cardiac Enzymes: No  results for input(s): "CKTOTAL", "CKMB", "CKMBINDEX", "TROPONINI" in the last 168 hours. BNP (last 3 results) No results for input(s): "PROBNP" in the last 8760 hours. HbA1C: No  results for input(s): "HGBA1C" in the last 72 hours. CBG: No results for input(s): "GLUCAP" in the last 168 hours. Lipid Profile: No results for input(s): "CHOL", "HDL", "LDLCALC", "TRIG", "CHOLHDL", "LDLDIRECT" in the last 72 hours. Thyroid Function Tests: No results for input(s): "TSH", "T4TOTAL", "FREET4", "T3FREE", "THYROIDAB" in the last 72 hours. Anemia Panel: No results for input(s): "VITAMINB12", "FOLATE", "FERRITIN", "TIBC", "IRON", "RETICCTPCT" in the last 72 hours. Urine analysis:    Component Value Date/Time   COLORURINE YELLOW (A) 05/09/2021 0900   APPEARANCEUR HAZY (A) 05/09/2021 0900   APPEARANCEUR Cloudy (A) 06/03/2020 1559   LABSPEC 1.026 05/09/2021 0900   LABSPEC 1.014 12/15/2014 0958   PHURINE 5.0 05/09/2021 0900   GLUCOSEU 50 (A) 05/09/2021 0900   GLUCOSEU Negative 12/15/2014 0958   HGBUR NEGATIVE 05/09/2021 0900   BILIRUBINUR NEGATIVE 05/09/2021 0900   BILIRUBINUR Negative 06/03/2020 1559   BILIRUBINUR Negative 12/15/2014 0958   KETONESUR NEGATIVE 05/09/2021 0900   PROTEINUR NEGATIVE 05/09/2021 0900   NITRITE NEGATIVE 05/09/2021 0900   LEUKOCYTESUR NEGATIVE 05/09/2021 0900   LEUKOCYTESUR 1+ 12/15/2014 0958    Radiological Exams on Admission: CT HEAD WO CONTRAST  Result Date: 11/17/2022 CLINICAL DATA:  Seventh nerve paresis. Facial paralysis/weakness. Decreased since a shin right body. EXAM: CT HEAD WITHOUT CONTRAST TECHNIQUE: Contiguous axial images were obtained from the base of the skull through the vertex without intravenous contrast. RADIATION DOSE REDUCTION: This exam was performed according to the departmental dose-optimization program which includes automated exposure control, adjustment of the mA and/or kV according to patient size and/or use of iterative reconstruction  technique. COMPARISON:  CT head 05/09/2021 FINDINGS: Brain: No evidence of acute infarction, hemorrhage, hydrocephalus, extra-axial collection or mass lesion/mass effect. Mild atrophy. Mild white matter hypodensity bilaterally unchanged from the prior study. Vascular: Negative for hyperdense vessel Skull: Negative Sinuses/Orbits: Paranasal sinuses clear. Bilateral cataract extraction Other: None IMPRESSION: No acute abnormality. Mild atrophy and mild chronic microvascular ischemic change in the white matter. Electronically Signed   By: Franchot Gallo M.D.   On: 11/17/2022 16:16     Data Reviewed: Relevant notes from primary care and specialist visits, past discharge summaries as available in EHR, including Care Everywhere. Prior diagnostic testing as pertinent to current admission diagnoses Updated medications and problem lists for reconciliation ED course, including vitals, labs, imaging, treatment and response to treatment Triage notes, nursing and pharmacy notes and ED provider's notes Notable results as noted in HPI   Assessment and Plan: * Acute CVA (cerebrovascular accident) [facility]) Patient with right-sided deficits with NIH of 4 arriving outside tPA window given onset of symptoms on 11/24 Daily ASA 81mg  daily, add Plavix 75mg  daily x 3 weeks then monotherapy with Plavix thereafter Continue atorvastatin and follow-up lipid profile MRI, telemetry, echocardiogram PT OT and speech therapy consults Neurology consult to follow   Facial paralysis on right side, possible Bells palsy We will treat for possible Bell's palsy with: Prednisone 60 mg daily x7 days with Protonix for GI protection Valacyclovir 1000 mg 3 times daily x7 days Lubricating eyedrops Tape eye shut at nights for sleep to prevent drying If ruled in for acute CVA and not Bell's in neurologist opinion, treatment can be discontinued  HTN (hypertension) BP controlled.  Continue lisinopril and metoprolol  Type 2 diabetes  mellitus with diabetic neuropathy (HCC) Continue basal insulin with sliding scale coverage Lyrica for neuropathic pain  Generalized anxiety disorder Continue mirtazapine, trazodone and venlafaxine  Chronic pain syndrome Continue Lyrica and as needed Flexeril  CAD S/P percutaneous  coronary angioplasty Patient denies chest pain, EKG nonacute and troponin negative Continue aspirin, atorvastatin, Imdur, lisinopril and metoprolol  Polypharmacy Patient on multiple sedating meds including Lyrica, Flexeril, trazodone, mirtazapine, MS IR Monitor mentation Fall precautions and out of bed with assistance only     DVT prophylaxis: Lovenox  Consults: Neurology, Dr. Malen Gauze  Advance Care Planning: DNR  Family Communication: none  Disposition Plan: Back to previous home environment  Severity of Illness: The appropriate patient status for this patient is OBSERVATION. Observation status is judged to be reasonable and necessary in order to provide the required intensity of service to ensure the patient's safety. The patient's presenting symptoms, physical exam findings, and initial radiographic and laboratory data in the context of their medical condition is felt to place them at decreased risk for further clinical deterioration. Furthermore, it is anticipated that the patient will be medically stable for discharge from the hospital within 2 midnights of admission.   Author: Athena Masse, MD 11/18/2022 12:03 AM  For on call review [email or web address].

## 2022-11-18 NOTE — Assessment & Plan Note (Signed)
Continue mirtazapine, trazodone and venlafaxine

## 2022-11-18 NOTE — Evaluation (Addendum)
Clinical/Bedside Swallow Evaluation Patient Details  Name: Claudie Brickhouse MRN: [id number] Date of Birth: Sep 22, 1946  Today's Date: 11/18/2022 Time: SLP Start Time (ACUTE ONLY): 1020 SLP Stop Time (ACUTE ONLY): 1115 SLP Time Calculation (min) (ACUTE ONLY): 55 min  Past Medical History:  Past Medical History:  Diagnosis Date   Anxiety    Chronic back pain    Collagen vascular disease (HCC)    Coronary artery disease    a. s/p PCI/DES to dRCA & mLCx in 2014 with repeat LHC in 10/2015 showing patent stents   Diabetes mellitus without complication (HCC)    Hypertension    Low back pain    Myocardial infarction (HCC)    Osteomyelitis of toe (HCC) 01/23/2016   Stroke [facility])    Past Surgical History:  Past Surgical History:  Procedure Laterality Date   AMPUTATION TOE Left 11/10/2015   Procedure: AMPUTATION TOE;  Surgeon: Linus Galas, MD;  Location: [facility] ORS;  Service: Podiatry;  Laterality: Left;   AMPUTATION TOE Left 01/24/2016   Procedure: AMPUTATION TOE (2nd mpj);  Surgeon: Linus Galas, DPM;  Location: [facility] ORS;  Service: Podiatry;  Laterality: Left;   AMPUTATION TOE Right 12/21/2018   Procedure: 2ND TOE AMPUTATION WITH DEBRIDEMENT OF SOFT TISSUE;  Surgeon: Recardo Evangelist, DPM;  Location: [facility] ORS;  Service: Podiatry;  Laterality: Right;   AMPUTATION TOE Left 06/12/2022   Procedure: AMPUTATION TOE; PARTIAL AMPUTATION LEFT GREAT TOE;  Surgeon: Linus Galas, DPM;  Location: [facility] ORS;  Service: Podiatry;  Laterality: Left;   CARDIAC CATHETERIZATION N/A 11/12/2015   Procedure: Left Heart Cath and Coronary Angiography;  Surgeon: Marcina Millard, MD;  Location: [facility] INVASIVE CV LAB;  Service: Cardiovascular;  Laterality: N/A;   COLONOSCOPY WITH PROPOFOL N/A 07/20/2017   Procedure: COLONOSCOPY WITH PROPOFOL;  Surgeon: Wyline Mood, MD;  Location: [facility] ENDOSCOPY;  Service: Endoscopy;  Laterality: N/A;   JOINT REPLACEMENT     TOE AMPUTATION     HPI:  Pt  is a 76 y.o. 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  Per Neurology f/u, MRI of the brain is negative, mild atrophy and mild chronic microvascular ischemic change in the white matter;  "she has clear signs of Bell's palsy on her examination with LMN pattern weakness on the face, lid lag and inability to wrinkle her forehead or close her right eyelid.  Symptoms have been going on since Friday.  No need for stroke workup at this time as her clinical exam and imaging are not consistent with stroke.  She has Bell's palsy of the right side-agree with Bell's palsy treatment.".  No family support in the area per chart.    Assessment / Plan / Recommendation  Clinical Impression   Pt seen for BSE this morning. Pt awake, verbal and engaged readily w/ this SLP. Noted R orofacial weakness -- pt has been dx'd w/ Bell's Palsy per Neurology note in chart. Pt on [place] O2 support; afebrile. WBC wnl.    Pt appears to present w/ No pharyngeal phase swallowing issues w/ No pharyngeal phase dysphagia noted, No neuromuscular deficits noted impacting the pharyngeal phase of swallowing. Pt has been dx'd w/ Bell's Palsy impacting Right orofacial tone. This slightly impacted  the oral phase c/b decreased R labial tone during pulling boluses from utensil and maintaining tight labial seal during oral phase mastication of solids. Pt was able to utilize sipping from the straw on the Left side of mouth w/ ease/success and consumed all po trials w/ No overt, clinical s/s of aspiration during oral intake.  Pt appears at reduced risk for aspiration following general aspiration precautions.  However, pt  does have the challenging factors that could impact her oropharyngeal swallowing to include chronic Pain/discomfort(per chart notes), deconditioning, and Bell's Palsy.   During po trials, pt consumed all consistencies w/ no overt coughing, decline in vocal quality, or change in respiratory presentation during/post trials. O2 sats remained 98%. Oral phase appeared grossly [facility] w/ timely bolus management and control of liquid and puree boluses for A-P transfer for swallowing; pt sipped on straw placed in L corner of mouth. Noted slight decreased labial seal on R corner of mouth during mastication and pulling bolus from utensil - no anterior leakage noted during these trials(SMALL bolus trials instructed during session). Pt has c/o anterior leakage in the past 1-2 days "since this started". Oral clearing achieved w/ all trial consistencies -- moistened, soft foods given.  OM Exam appeared [facility] for lingual ROM and strength; R labial decreased tone and strength present in setting of Bell's Palsy. Speech intelligible but decreased articulatory precision d/t R labial decreased tone.  Pt fed self w/ setup support.   OF NOTE: pt c/o significant s/s of REFLUX. Encouraged her to f/u w/ MD, even, GI, for management of REFLUX s/s to lessen risk for Regurgitation of foods/liquids.   Recommend continue a Regular consistency diet w/ well-Cut meats, moistened foods; Thin liquids -- straw sipping on Left side of mouth. Recommend general aspiration precautions, REFLUX precautions. Pills WHOLE in Puree for safer, easier swallowing if needed.  Education given on Pills in Puree; food consistencies and easy to chew/eat options; general aspiration and REFLUX precautions to pt. MD/NSG to reconsult if any new needs arise. NSG updated, agreed. MD updated. Recommend Dietician f/u for support.  OF NOTE: regarding speech/articulation strategies and/or tx, recommend no oral exercises at this time as it is recommended not to stress Bell's  Palsy(viral) during the initial healing process; medications have been ordered by MD. Recommend f/u w/ pt's PCP and Neurology for further management and tx over the next couple of weeks. This was discussed w/ MD and pt.   SLP Visit Diagnosis: Dysphagia, unspecified (R13.10) (Bell's Palsy)    Aspiration Risk   (reduced following general aspiration precautions)    Diet Recommendation   a Regular consistency diet w/ well-Cut meats, moistened foods; Thin liquids -- straw sipping on Left side of mouth. Recommend general aspiration precautions, REFLUX precautions.  Medication Administration: Whole meds with puree (for safer swallowing)    Other  Recommendations Recommended Consults:  (Neurology f/u for the Bell's Palsy; GI for Reflux issues/PPI) Oral Care Recommendations: Oral care BID;Oral care before and after PO;Patient independent with oral care Other Recommendations:  (n/a)    Recommendations for follow up therapy are one component of a multi-disciplinary discharge planning process, led by the attending physician.  Recommendations may be updated based on patient status, additional functional criteria and insurance authorization.  Follow up Recommendations No SLP follow up      Assistance Recommended at Discharge    Functional Status Assessment Patient has had a recent decline in their functional status and demonstrates the ability to make significant improvements in function in a  reasonable and predictable amount of time.  Frequency and Duration  (n/a)   (n/a)       Prognosis Prognosis for Safe Diet Advancement: Good Barriers to Reach Goals: Time post onset;Severity of deficits Barriers/Prognosis Comment: Bell's Palsy      Swallow Study   General Date of Onset: 11/17/22 HPI: Pt  is a 76 y.o. 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  Per Neurology f/u, MRI of the brain is negative, mild atrophy and mild chronic microvascular ischemic change in the white matter;  "she has clear signs of Bell's palsy on her examination with LMN pattern weakness on the face, lid lag and inability to wrinkle her forehead or close her right eyelid.  Symptoms have been going on since Friday.  No need for stroke workup at this time as her clinical exam and imaging are not consistent with stroke.  She has Bell's palsy of the right side-agree with Bell's palsy treatment.".  No family support in the area per chart. Type of Study: Bedside Swallow Evaluation Previous Swallow Assessment: none Diet Prior to this Study: Regular;Thin liquids Temperature Spikes Noted: No (wbc 10.5) Respiratory Status: Nasal cannula (3L) History of Recent Intubation: No Behavior/Cognition: Alert;Cooperative;Pleasant mood (HOH) Oral Cavity Assessment: Within Functional Limits Oral Care Completed by SLP: Recent completion by staff Oral Cavity - Dentition: Dentures, top Vision: Functional for self-feeding Self-Feeding Abilities: Able to feed self;Needs set up Patient Positioning: Upright in bed Baseline Vocal Quality: Normal Volitional Cough: Strong Volitional Swallow: Able to elicit    Oral/Motor/Sensory Function Overall Oral Motor/Sensory Function: Mild impairment Facial ROM: Reduced right;Suspected CN VII (facial) dysfunction Facial Symmetry: Abnormal symmetry right;Suspected CN VII (facial) dysfunction Facial Strength: Reduced right;Suspected CN VII (facial) dysfunction Lingual ROM: Within Functional Limits Lingual Symmetry: Within Functional Limits Lingual Strength: Within Functional Limits Velum: Within Functional Limits Mandible: Within  Functional Limits   Ice Chips Ice chips: Within functional limits Presentation: Spoon (fed; 2 trials)   Thin Liquid Thin Liquid: Within functional limits Presentation: Self Fed;Straw (left side of mouth/lips w/ straw use; ~4 ozs) Other Comments: bell's palsy impacting labial strength    Nectar Thick Nectar Thick Liquid: Not tested   Honey Thick Honey Thick Liquid: Not tested   Puree Puree: Within functional limits Presentation: Self Fed;Spoon (8 trials)   Solid     Solid: Impaired Presentation: Self Fed (5 trials) Oral Phase Impairments: Reduced labial seal (min+) Oral Phase Functional Implications: Right anterior spillage (slight) Pharyngeal Phase Impairments:  (none)        Jerilynn Som, MS, CCC-SLP Speech Language Pathologist Rehab Services; [facility] [place] [phone] (ascom) [place]ath 11/18/2022,1:16 PM

## 2022-12-25 ENCOUNTER — Other Ambulatory Visit: Payer: Self-pay | Admitting: Infectious Diseases

## 2022-12-25 DIAGNOSIS — R112 Nausea with vomiting, unspecified: Secondary | ICD-10-CM

## 2022-12-25 DIAGNOSIS — R1013 Epigastric pain: Secondary | ICD-10-CM

## 2023-01-07 ENCOUNTER — Ambulatory Visit: Admission: RE | Admit: 2023-01-07 | Payer: Medicare HMO | Source: Ambulatory Visit

## 2023-01-12 ENCOUNTER — Ambulatory Visit
Admission: RE | Admit: 2023-01-12 | Discharge: 2023-01-12 | Disposition: A | Discharge status: Home / Self Care | Payer: Medicare HMO | Source: Ambulatory Visit | Attending: Infectious Diseases | Admitting: Infectious Diseases

## 2023-01-12 DIAGNOSIS — R112 Nausea with vomiting, unspecified: Secondary | ICD-10-CM | POA: Diagnosis present

## 2023-01-12 DIAGNOSIS — R1013 Epigastric pain: Secondary | ICD-10-CM | POA: Insufficient documentation

## 2023-01-12 LAB — POCT I-STAT CREATININE: Creatinine, Ser: 0.9 mg/dL (ref 0.44–1.00)

## 2023-01-12 MED ORDER — IOHEXOL 300 MG/ML  SOLN
100.0000 mL | Freq: Once | INTRAMUSCULAR | Status: AC | PRN
  Administered 2023-01-12: 100 mL via INTRAVENOUS

## 2023-01-19 ENCOUNTER — Ambulatory Visit: Admission: RE | Admit: 2023-01-19 | Payer: Medicare HMO | Source: Ambulatory Visit

## 2023-02-15 ENCOUNTER — Other Ambulatory Visit (INDEPENDENT_AMBULATORY_CARE_PROVIDER_SITE_OTHER): Payer: Self-pay | Admitting: Vascular Surgery

## 2023-02-15 DIAGNOSIS — K551 Chronic vascular disorders of intestine: Secondary | ICD-10-CM

## 2023-02-16 ENCOUNTER — Ambulatory Visit (INDEPENDENT_AMBULATORY_CARE_PROVIDER_SITE_OTHER): Payer: Medicare HMO | Admitting: Vascular Surgery

## 2023-02-16 ENCOUNTER — Encounter (INDEPENDENT_AMBULATORY_CARE_PROVIDER_SITE_OTHER): Payer: Self-pay | Admitting: Vascular Surgery

## 2023-02-16 ENCOUNTER — Ambulatory Visit (INDEPENDENT_AMBULATORY_CARE_PROVIDER_SITE_OTHER): Payer: Medicare HMO

## 2023-02-16 VITALS — BP 110/66 | HR 80 | Resp 18 | Ht 62.0 in | Wt 185.0 lb

## 2023-02-16 DIAGNOSIS — E1140 Type 2 diabetes mellitus with diabetic neuropathy, unspecified: Secondary | ICD-10-CM

## 2023-02-16 DIAGNOSIS — I10 Essential (primary) hypertension: Secondary | ICD-10-CM

## 2023-02-16 DIAGNOSIS — K551 Chronic vascular disorders of intestine: Secondary | ICD-10-CM

## 2023-02-16 DIAGNOSIS — I771 Stricture of artery: Secondary | ICD-10-CM

## 2023-02-16 NOTE — Patient Instructions (Signed)
Angiogram, Care After After the procedure, it is common to have: Bruising and tenderness at the catheter insertion area. A collection of blood (hematoma) at the insertion area. This may feel like a small lump under the skin at the insertion site. Follow these instructions at home: Insertion site care  Follow instructions from your health care provider about how to take care of your insertion site. Make sure you: Wash your hands with soap and water for at least 20 seconds before and after you change your bandage (dressing). If soap and water are not available, use hand sanitizer. Change your dressing as told by your health care provider. Do not take baths, swim, or use a hot tub until your health care provider approves. You may shower 24-48 hours after the procedure, or as told by your health care provider. To clean the insertion site: Gently wash the area with plain soap and water. Pat the area dry with a clean towel. Do not rub the site. This may cause bleeding. Do not apply powder or lotion to the site. Keep the site clean and dry. Check your insertion site every day for signs of infection. Check for: Redness, swelling, or pain. Fluid or blood. Warmth. Pus or a bad smell. Activity If you were given a sedative during the procedure, it can affect you for several hours. Do not drive or operate machinery until your health care provider says that it is safe. Rest as told by your health care provider. This is usually for 1-2 days. If the catheter was inserted through your leg, avoid walking up or down the stairs for a few days. If the catheter was inserted through your wrist, avoid repetitive hand and wrist movement for a few days. Return to your normal activities as told by your health care provider, usually in about a week. Ask your health care provider what activities are safe for you. General instructions If your insertion site starts to bleed, lie flat and put pressure on the site. If  the bleeding does not stop, get help right away. This is a medical emergency. Take over-the-counter and prescription medicines only as told by your health care provider. Drink enough fluid to keep your urine pale yellow. This helps flush the contrast dye from your body. Keep all follow-up visits for continued treatment and for your safety. Contact a health care provider if: You have a fever or chills. You have any signs of infection at the insertion site. You have slight bleeding from the insertion area. Hold pressure on the area. Get help right away if: You have a problem in the insertion area, such as: Severe pain, rapid swelling, or bleeding that does not stop when you apply firm pressure to the area. Fluid, pus or a bad smell coming from your insertion site. The insertion site or limb becomes pale, cool, tingly, or numb. Pain in the limb of the insertion site. You have chest pain. You have trouble breathing. You have any symptoms of a stroke. "BE FAST" is an easy way to remember the main warning signs of a stroke: B - Balance. Signs are dizziness, sudden trouble walking, or loss of balance. E - Eyes. Signs are trouble seeing or a sudden change in vision. F - Face. Signs are sudden weakness or loss of feeling of the face, or the face or eyelid drooping on one side. A - Arms. Signs are weakness or loss of feeling in an arm. This happens suddenly and usually on one side of the  body. S - Speech. Signs are sudden trouble speaking, slurred speech, or trouble understanding what people say. T - Time. Time to call emergency services. Write down what time symptoms started. You have other signs of a stroke, such as: A sudden, severe headache with no known cause. Nausea or vomiting. Seizure. You have severe pain in your hand or leg. These symptoms may be an emergency. Get help right away. Call 911. Do not wait to see if the symptoms will go away. Do not drive yourself to the hospital. This  information is not intended to replace advice given to you by your health care provider. Make sure you discuss any questions you have with your health care provider. Document Revised: 07/01/2022 Document Reviewed: 07/01/2022 Elsevier Patient Education  [place].

## 2023-02-16 NOTE — Assessment & Plan Note (Signed)
We did a mesenteric duplex today for further evaluation.  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  Her SMA velocities were fairly normal and did not indicate a hemodynamically significant stenosis.  The IMA was not visualized.  This generally correlates with her CT scan although the CT scan might of suggested a significant SMA stenosis as well, but this was not a CT angiogram and is difficult to discern from that study.  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  We also discussed this might be a median arcuate ligament compression syndrome in which case surgical therapy at another institution may be required.  We discussed that we may fix her celiac artery stenosis, and she may be having symptoms for other reasons as this is not necessarily a classic presentation and multiple things could be causing her symptomatology.  She wants to go home and think over whether or not she would like to have the angiogram but is leaning towards having it.  I think that is very reasonable.  We will begin the preapproval process and contact her to decide the timing on performing the procedure

## 2023-02-16 NOTE — Progress Notes (Signed)
Patient ID: Jessica Donovan, female   DOB: 1946-06-20, 77 y.o.   MRN: [id number]  Chief Complaint  Patient presents with   New Patient (Initial Visit)    np. mesenteric + consult. stenosis on CT in epic from 1.26.24. nausea + vomiting + pain in abdomen. referred by fitzgerald, david    HPI Jessica Donovan is a 77 y.o. female.  I am asked to see the patient by Dr. 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  She has lost 12 pounds.  She had a CT scan of the abdomen pelvis performed prior to her visit which I have independently reviewed.  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  The left renal artery also appeared to have some degree of disease.  She is only on 1 antihypertensive better blood pressure control is usually fairly good.  We did a mesenteric duplex today for further evaluation.  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  Her SMA velocities were fairly normal and did not indicate a hemodynamically significant stenosis.  The IMA was not visualized.     Past Medical History:  Diagnosis Date   Anxiety    Chronic back pain    Collagen vascular disease ([place])    Coronary artery disease    a. s/p PCI/DES to dRCA & mLCx in 2014 with repeat LHC in 10/2015 showing patent stents   Diabetes mellitus without complication ([place])    Hypertension    Low back pain    Myocardial infarction ([place])    Osteomyelitis of toe ([place]) 01/23/2016   Stroke [facility])     Past Surgical History:  Procedure Laterality Date   AMPUTATION TOE Left 11/10/2015   Procedure: AMPUTATION TOE;  Surgeon: Sharlotte Alamo, MD;  Location: [facility] ORS;  Service: Podiatry;  Laterality: Left;   AMPUTATION TOE Left 01/24/2016   Procedure: AMPUTATION TOE (2nd mpj);  Surgeon: Sharlotte Alamo, DPM;  Location: [facility] ORS;  Service:  Podiatry;  Laterality: Left;   AMPUTATION TOE Right 12/21/2018   Procedure: 2ND TOE AMPUTATION WITH DEBRIDEMENT OF SOFT TISSUE;  Surgeon: Albertine Patricia, DPM;  Location: [facility] ORS;  Service: Podiatry;  Laterality: Right;   AMPUTATION TOE Left 06/12/2022   Procedure: AMPUTATION TOE; PARTIAL AMPUTATION LEFT GREAT TOE;  Surgeon: Sharlotte Alamo, DPM;  Location: [facility] ORS;  Service: Podiatry;  Laterality: Left;   CARDIAC CATHETERIZATION N/A 11/12/2015   Procedure: Left Heart Cath and Coronary Angiography;  Surgeon: Isaias Cowman, MD;  Location: [place] CV LAB;  Service: Cardiovascular;  Laterality: N/A;   COLONOSCOPY WITH PROPOFOL N/A 07/20/2017   Procedure: COLONOSCOPY WITH PROPOFOL;  Surgeon: Jonathon Bellows, MD;  Location: [facility] ENDOSCOPY;  Service: Endoscopy;  Laterality: N/A;   JOINT REPLACEMENT     TOE AMPUTATION       Family History  Problem Relation Age of Onset   CAD Mother    CAD Father   No bleeding or clotting disorders   Social History   Tobacco Use   Smoking status: Never   Smokeless tobacco: Never  Vaping Use   Vaping Use: Never used  Substance Use Topics   Alcohol use: No   Drug use: No     Allergies  Allergen Reactions   Amoxicillin Diarrhea   Ampicillin Nausea And Vomiting and Other (See Comments)    Did it  involve swelling of the face/tongue/throat, SOB, or low BP? Yes Did it involve sudden or severe rash/hives, skin peeling, or any reaction on the inside of your mouth or nose? No Did you need to seek medical attention at a hospital or doctor's office? No When did it last happen? >10 years If all above answers are "NO", may proceed with cephalosporin use.    Augmentin [Amoxicillin-Pot Clavulanate] Diarrhea   Bactrim [Sulfamethoxazole-Trimethoprim] Other (See Comments)    N/V/D   Codeine Nausea And Vomiting   Eggs Or Egg-Derived Products     ALLergic to cook or raw egg only. Food with egg ingredient is okay for patient.    Influenza Vaccines Other (See  Comments)    Reaction:  Caused pt to pass out    Influenza Virus Vaccine Other (See Comments)    Reaction: Passed out for 12 days   Methadone Hives and Itching   Oxycodone-Acetaminophen Nausea And Vomiting   Percocet [Oxycodone-Acetaminophen] Nausea And Vomiting   Sulfa Antibiotics Other (See Comments)    Reaction: unknown   Tetanus Antitoxin Swelling and Other (See Comments)    Reaction: injection site swelling   Tetanus Toxoids Swelling and Other (See Comments)    Reaction:  Swelling at injection site   Penicillin G Rash    Has patient had a PCN reaction causing immediate rash, facial/tongue/throat swelling, SOB or lightheadedness with hypotension: Yes Has patient had a PCN reaction causing severe rash involving mucus membranes or skin necrosis: No Has patient had a PCN reaction that required hospitalization: No Has patient had a PCN reaction occurring within the last 10 years: No If all of the above answers are "NO", then may proceed with Cephalosporin use..   Tetracyclines & Related Rash    Current Outpatient Medications  Medication Sig Dispense Refill   ACCU-CHEK GUIDE test strip TEST 3 TIMES DAILY AS DIRECTED     acetaminophen (TYLENOL) 500 MG tablet Take 1,000 mg by mouth every 8 (eight) hours as needed for mild pain or headache.      ALPRAZolam (XANAX) 0.5 MG tablet Take 0.5 mg by mouth 4 (four) times daily as needed for anxiety.     AMBULATORY NON FORMULARY MEDICATION Compounded estrogen  Apply 1 gram vaginally M,W, Friday [facility] 1 applicator 0   aspirin 81 MG tablet Take 81 mg by mouth daily.     atorvastatin (LIPITOR) 20 MG tablet Take 20 mg by mouth daily.      Blood Glucose Monitoring Suppl (ONE TOUCH ULTRA 2) w/Device KIT SMARTSIG:1 Via Meter Daily     celecoxib (CELEBREX) 200 MG capsule Take 200 mg by mouth daily.     cyclobenzaprine (FLEXERIL) 10 MG tablet Take 10 mg by mouth 3 (three) times daily as needed.      insulin aspart (NOVOLOG) 100 UNIT/ML  injection Inject 3-15 Units into the skin 3 (three) times daily with meals as needed for high blood sugar. Pt uses as needed per sliding scale:    Less than 140:  0 units  140-180:  3 units 181-220:  4 units 221- 260:  6 units 261- 320:  8 units 321-360:  10 units 361-400:  12 units Greater than 400:  15 units     isosorbide mononitrate (IMDUR) 30 MG 24 hr tablet Take 30 mg by mouth daily.     Lancets (ONETOUCH DELICA PLUS 123XX123) MISC Apply topically.     lisinopril (ZESTRIL) 20 MG tablet Take 20 mg by mouth daily.  mirtazapine (REMERON) 15 MG tablet Take 15 mg by mouth at bedtime.     morphine (MSIR) 15 MG tablet Take 15 mg by mouth 2 (two) times daily.     Multiple Vitamins-Minerals (HAIR SKIN AND NAILS FORMULA PO) Take by mouth daily. 2 gummies daily     omeprazole (PRILOSEC) 40 MG capsule Take 1 capsule (40 mg total) by mouth daily.     polyvinyl alcohol (LIQUIFILM TEARS) 1.4 % ophthalmic solution Place 1 drop into the right eye 4 (four) times daily. 15 mL 0   pregabalin (LYRICA) 100 MG capsule Take 100 mg by mouth 3 (three) times daily.     promethazine (PHENERGAN) 25 MG tablet Take 25 mg by mouth every 8 (eight) hours as needed.     TOUJEO SOLOSTAR 300 UNIT/ML SOPN Inject 60 Units into the skin every morning.     traZODone (DESYREL) 100 MG tablet Take 100 mg by mouth at bedtime.     venlafaxine XR (EFFEXOR-XR) 75 MG 24 hr capsule Take 75 mg by mouth 2 (two) times daily.     Vitamin D, Ergocalciferol, (DRISDOL) 50000 units CAPS capsule Take 50,000 Units by mouth every Sunday.     metoprolol tartrate (LOPRESSOR) 25 MG tablet Take 1 tablet (25 mg total) by mouth 2 (two) times daily. 60 tablet 0   nitrofurantoin, macrocrystal-monohydrate, (MACROBID) 100 MG capsule Take 100 mg by mouth daily. (Patient not taking: Reported on 02/16/2023)     No current facility-administered medications for this visit.      REVIEW OF SYSTEMS (Negative unless checked)  Constitutional: '[x]'$ Weight  loss  '[]'$ Fever  '[]'$ Chills Cardiac: '[]'$ Chest pain   '[]'$ Chest pressure   '[]'$ Palpitations   '[]'$ Shortness of breath when laying flat   '[]'$ Shortness of breath at rest   '[]'$ Shortness of breath with exertion. Vascular:  '[]'$ Pain in legs with walking   '[]'$ Pain in legs at rest   '[]'$ Pain in legs when laying flat   '[]'$ Claudication   '[]'$ Pain in feet when walking  '[]'$ Pain in feet at rest  '[]'$ Pain in feet when laying flat   '[]'$ History of DVT   '[]'$ Phlebitis   '[]'$ Swelling in legs   '[]'$ Varicose veins   '[]'$ Non-healing ulcers Pulmonary:   '[]'$ Uses home oxygen   '[]'$ Productive cough   '[]'$ Hemoptysis   '[]'$ Wheeze  '[]'$ COPD   '[]'$ Asthma Neurologic:  '[]'$ Dizziness  '[]'$ Blackouts   '[]'$ Seizures   '[x]'$ History of stroke   '[]'$ History of TIA  '[]'$ Aphasia   '[]'$ Temporary blindness   '[]'$ Dysphagia   '[]'$ Weakness or numbness in arms   '[]'$ Weakness or numbness in legs Musculoskeletal:  '[x]'$ Arthritis   '[]'$ Joint swelling   '[]'$ Joint pain   '[x]'$ Low back pain Hematologic:  '[]'$ Easy bruising  '[]'$ Easy bleeding   '[]'$ Hypercoagulable state   '[]'$ Anemic  '[]'$ Hepatitis Gastrointestinal:  '[]'$ Blood in stool   '[]'$ Vomiting blood  '[]'$ Gastroesophageal reflux/heartburn   '[]'$ Abdominal pain Genitourinary:  '[]'$ Chronic kidney disease   '[]'$ Difficult urination  '[]'$ Frequent urination  '[]'$ Burning with urination   '[]'$ Hematuria Skin:  '[]'$ Rashes   '[]'$ Ulcers   '[]'$ Wounds Psychological:  '[x]'$ History of anxiety   '[]'$  History of major depression.    Physical Exam BP 110/66 (BP Location: Left Arm)   Pulse 80   Resp 18   Ht '5\' 2"'$  (1.575 m)   Wt 185 lb (83.9 kg)   BMI 33.84 kg/m  Gen:  WD/WN, NAD, debilitated appearing Head: [place]/AT, No temporalis wasting.  Ear/Nose/Throat: Hearing grossly intact, nares w/o erythema or drainage, oropharynx w/o Erythema/Exudate Eyes: Conjunctiva clear, sclera non-icteric  Neck: trachea midline.  No  JVD.  Pulmonary:  Good air movement, respirations not labored, no use of accessory muscles  Cardiac: RRR, no JVD Vascular:  Vessel Right Left  Radial Palpable Palpable                                    Gastrointestinal:. No masses, surgical incisions, or scars. Musculoskeletal: M/S 5/5 throughout.  Extremities without ischemic changes.  No deformity or atrophy. In a wheelchair, mild LE edema. Neurologic: Sensation grossly intact in extremities.  Symmetrical.  Speech is fluent. Motor exam as listed above. Psychiatric: Judgment intact, Mood & affect appropriate for pt's clinical situation. Dermatologic: No rashes or ulcers noted.  No cellulitis or open wounds.    Radiology No results found.  Labs Recent Results (from the past 2160 hour(s))  I-STAT creatinine     Status: None   Collection Time: 01/12/23  2:58 PM  Result Value Ref Range   Creatinine, Ser 0.90 0.44 - 1.00 mg/dL    Assessment/Plan:  HTN (hypertension) blood pressure control important in reducing the progression of atherosclerotic disease. On appropriate oral medications.   Type 2 diabetes mellitus with diabetic neuropathy (HCC) blood glucose control important in reducing the progression of atherosclerotic disease. Also, involved in wound healing. On appropriate medications.   Celiac artery stenosis (HCC) We did a mesenteric duplex today for further evaluation.  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  Her SMA velocities were fairly normal and did not indicate a hemodynamically significant stenosis.  The IMA was not visualized.  This generally correlates with her CT scan although the CT scan might of suggested a significant SMA stenosis as well, but this was not a CT angiogram and is difficult to discern from that study.  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  We also discussed this might be a median arcuate ligament compression syndrome in which case surgical therapy at another institution may be required.  We discussed that we may fix her  celiac artery stenosis, and she may be having symptoms for other reasons as this is not necessarily a classic presentation and multiple things could be causing her symptomatology.  She wants to go home and think over whether or not she would like to have the angiogram but is leaning towards having it.  I think that is very reasonable.  We will begin the preapproval process and contact her to decide the timing on performing the procedure      Leotis Pain 02/16/2023, 1:48 PM   This note was created with Dragon medical transcription system.  Any errors from dictation are unintentional.

## 2023-02-16 NOTE — Assessment & Plan Note (Signed)
blood glucose control important in reducing the progression of atherosclerotic disease. Also, involved in wound healing. On appropriate medications.  

## 2023-02-16 NOTE — Assessment & Plan Note (Signed)
blood pressure control important in reducing the progression of atherosclerotic disease. On appropriate oral medications.  

## 2023-03-15 ENCOUNTER — Telehealth (INDEPENDENT_AMBULATORY_CARE_PROVIDER_SITE_OTHER): Payer: Self-pay

## 2023-03-15 NOTE — Telephone Encounter (Signed)
I attempted to contact the patient to schedule a celiac angio and stent placement with Dr. Lucky Cowboy. A message was left for a return call.

## 2023-03-29 ENCOUNTER — Telehealth (INDEPENDENT_AMBULATORY_CARE_PROVIDER_SITE_OTHER): Payer: Self-pay

## 2023-03-29 NOTE — Telephone Encounter (Addendum)
I have attempted to contact the patient to schedule her for a celiac stent placement with Dr. Wyn Quaker. I left messages with the patient and her friend-Suzy and neighbor- Ray as well for a call back. Patient's friend Thomasene Lot called back and gave the patient's home number and I left a message for her the patient to return my call. Patient called back and is scheduled with Dr. Wyn Quaker on 08/05/23 for a celiac angio and stent placement with a 9:30 am arrival time to the [facility]. Pre-procedure instructions were discussed and will be mailed.

## 2023-04-05 ENCOUNTER — Ambulatory Visit
Admission: RE | Admit: 2023-04-05 | Discharge: 2023-04-05 | Disposition: A | Discharge status: Home / Self Care | Payer: Medicare HMO | Attending: Vascular Surgery | Admitting: Vascular Surgery

## 2023-04-05 ENCOUNTER — Encounter
Admission: RE | Disposition: A | Discharge status: Home / Self Care | Payer: Self-pay | Source: Home / Self Care | Attending: Vascular Surgery

## 2023-04-05 DIAGNOSIS — E1140 Type 2 diabetes mellitus with diabetic neuropathy, unspecified: Secondary | ICD-10-CM | POA: Insufficient documentation

## 2023-04-05 DIAGNOSIS — I708 Atherosclerosis of other arteries: Secondary | ICD-10-CM

## 2023-04-05 DIAGNOSIS — I701 Atherosclerosis of renal artery: Secondary | ICD-10-CM | POA: Diagnosis not present

## 2023-04-05 DIAGNOSIS — I771 Stricture of artery: Secondary | ICD-10-CM | POA: Insufficient documentation

## 2023-04-05 DIAGNOSIS — R935 Abnormal findings on diagnostic imaging of other abdominal regions, including retroperitoneum: Secondary | ICD-10-CM

## 2023-04-05 DIAGNOSIS — I10 Essential (primary) hypertension: Secondary | ICD-10-CM

## 2023-04-05 DIAGNOSIS — K551 Chronic vascular disorders of intestine: Secondary | ICD-10-CM | POA: Insufficient documentation

## 2023-04-05 HISTORY — PX: VISCERAL ANGIOGRAPHY: CATH118276

## 2023-04-05 LAB — GLUCOSE, CAPILLARY
Glucose-Capillary: 59 mg/dL — ABNORMAL LOW (ref 70–99)
Glucose-Capillary: 135 mg/dL — ABNORMAL HIGH (ref 70–99)

## 2023-04-05 LAB — CREATININE, SERUM
Creatinine, Ser: 0.89 mg/dL (ref 0.44–1.00)
GFR, Estimated: >60 mL/min (ref >60)

## 2023-04-05 LAB — BUN: BUN: 21 mg/dL (ref 8–23)

## 2023-04-05 SURGERY — VISCERAL ANGIOGRAPHY
Anesthesia: Moderate Sedation

## 2023-04-05 MED ORDER — MORPHINE SULFATE 15 MG PO TABS
15.0000 mg | ORAL_TABLET | Freq: Once | ORAL | Status: AC
  Administered 2023-04-05: 15 mg via ORAL
  Filled 2023-04-05: qty 1

## 2023-04-05 MED ORDER — MIDAZOLAM HCL 5 MG/5ML IJ SOLN
INTRAMUSCULAR | Status: AC
  Filled 2023-04-05: qty 5

## 2023-04-05 MED ORDER — METHYLPREDNISOLONE SODIUM SUCC 125 MG IJ SOLR: 125.0000 mg | Freq: Once | INTRAMUSCULAR | Status: DC | PRN

## 2023-04-05 MED ORDER — DEXTROSE 50 % IV SOLN
INTRAVENOUS | Status: AC
  Filled 2023-04-05: qty 50

## 2023-04-05 MED ORDER — SODIUM CHLORIDE 0.9 % IV SOLN
INTRAVENOUS | Status: DC
  Administered 2023-04-05: 1000 mL via INTRAVENOUS

## 2023-04-05 MED ORDER — HEPARIN SODIUM (PORCINE) 1000 UNIT/ML IJ SOLN
INTRAMUSCULAR | Status: AC
  Filled 2023-04-05: qty 10

## 2023-04-05 MED ORDER — MIDAZOLAM HCL 2 MG/2ML IJ SOLN
INTRAMUSCULAR | Status: DC | PRN
  Administered 2023-04-05: 2 mg via INTRAVENOUS

## 2023-04-05 MED ORDER — ONDANSETRON HCL 4 MG/2ML IJ SOLN: 4.0000 mg | Freq: Four times a day (QID) | INTRAMUSCULAR | Status: DC | PRN

## 2023-04-05 MED ORDER — CLOPIDOGREL BISULFATE 75 MG PO TABS: 75.0000 mg | ORAL_TABLET | Freq: Every day | ORAL | 11 refills | Status: DC

## 2023-04-05 MED ORDER — DEXTROSE 50 % IV SOLN
1.0000 | Freq: Once | INTRAVENOUS | Status: AC
  Administered 2023-04-05: 50 mL via INTRAVENOUS

## 2023-04-05 MED ORDER — FAMOTIDINE 20 MG PO TABS: 40.0000 mg | ORAL_TABLET | Freq: Once | ORAL | Status: DC | PRN

## 2023-04-05 MED ORDER — VANCOMYCIN HCL IN DEXTROSE 1-5 GM/200ML-% IV SOLN
INTRAVENOUS | Status: AC
  Filled 2023-04-05: qty 200

## 2023-04-05 MED ORDER — FENTANYL CITRATE (PF) 100 MCG/2ML IJ SOLN
INTRAMUSCULAR | Status: AC
  Filled 2023-04-05: qty 2

## 2023-04-05 MED ORDER — DIPHENHYDRAMINE HCL 50 MG/ML IJ SOLN: 50.0000 mg | Freq: Once | INTRAMUSCULAR | Status: DC | PRN

## 2023-04-05 MED ORDER — IODIXANOL 320 MG/ML IV SOLN
INTRAVENOUS | Status: DC | PRN
  Administered 2023-04-05: 50 mL

## 2023-04-05 MED ORDER — HYDROMORPHONE HCL 1 MG/ML IJ SOLN: 1.0000 mg | Freq: Once | INTRAMUSCULAR | Status: DC | PRN

## 2023-04-05 MED ORDER — HEPARIN SODIUM (PORCINE) 1000 UNIT/ML IJ SOLN
INTRAMUSCULAR | Status: DC | PRN
  Administered 2023-04-05: 5000 [IU] via INTRAVENOUS

## 2023-04-05 MED ORDER — FENTANYL CITRATE (PF) 100 MCG/2ML IJ SOLN
INTRAMUSCULAR | Status: DC | PRN
  Administered 2023-04-05: 50 ug via INTRAVENOUS

## 2023-04-05 MED ORDER — VANCOMYCIN HCL IN DEXTROSE 1-5 GM/200ML-% IV SOLN
1000.0000 mg | INTRAVENOUS | Status: AC
  Administered 2023-04-05: 1000 mg via INTRAVENOUS

## 2023-04-05 MED ORDER — MIDAZOLAM HCL 2 MG/ML PO SYRP: 8.0000 mg | ORAL_SOLUTION | Freq: Once | ORAL | Status: DC | PRN

## 2023-04-05 SURGICAL SUPPLY — 15 items
CATH ANGIO 5F PIGTAIL 65CM (CATHETERS) IMPLANT
CATH BEACON 5 .035 65 C2 TIP (CATHETERS) IMPLANT
COVER PROBE ULTRASOUND 5X96 (MISCELLANEOUS) IMPLANT
DEVICE STARCLOSE SE CLOSURE (Vascular Products) IMPLANT
DEVICE TORQUE .025-.038 (MISCELLANEOUS) IMPLANT
GLIDEWIRE STIFF .35X180X3 HYDR (WIRE) IMPLANT
KIT ENCORE 26 ADVANTAGE (KITS) IMPLANT
PACK ANGIOGRAPHY (CUSTOM PROCEDURE TRAY) ×1 IMPLANT
SHEATH ANL2 6FRX45 HC (SHEATH) IMPLANT
SHEATH BRITE TIP 5FRX11 (SHEATH) IMPLANT
STENT LIFESTREAM 7X26X80 (Permanent Stent) IMPLANT
SYR MEDRAD MARK 7 150ML (SYRINGE) IMPLANT
TUBING CONTRAST HIGH PRESS 72 (TUBING) IMPLANT
WIRE GUIDERIGHT .035X150 (WIRE) IMPLANT
WIRE SUPRACORE 190CM (WIRE) IMPLANT

## 2023-04-05 NOTE — H&P (Signed)
[facility] Admission History & Physical  MRN : [id number]  Jessica Donovan is a 77 y.o. (Mar 06, 1946) female who presents with chief complaint of No chief complaint on file. [name]  History of Present Illness: Patient presents today for angiography of her visceral vessels with possible intervention.  Has abdominal pain, weight loss, and food fear.  Noninvasive studies suggested celiac artery stenosis and CT scan suggesting possible SMA and celiac stenosis.  No current facility-administered medications for this encounter.    Past Medical History:  Diagnosis Date   Anxiety    Chronic back pain    Collagen vascular disease (HCC)    Coronary artery disease    a. s/p PCI/DES to dRCA & mLCx in 2014 with repeat LHC in 10/2015 showing patent stents   Diabetes mellitus without complication (HCC)    Hypertension    Low back pain    Myocardial infarction (HCC)    Osteomyelitis of toe (HCC) 01/23/2016   Stroke [facility])     Past Surgical History:  Procedure Laterality Date   AMPUTATION TOE Left 11/10/2015   Procedure: AMPUTATION TOE;  Surgeon: Linus Galas, MD;  Location: [facility] ORS;  Service: Podiatry;  Laterality: Left;   AMPUTATION TOE Left 01/24/2016   Procedure: AMPUTATION TOE (2nd mpj);  Surgeon: Linus Galas, DPM;  Location: [facility] ORS;  Service: Podiatry;  Laterality: Left;   AMPUTATION TOE Right 12/21/2018   Procedure: 2ND TOE AMPUTATION WITH DEBRIDEMENT OF SOFT TISSUE;  Surgeon: Recardo Evangelist, DPM;  Location: [facility] ORS;  Service: Podiatry;  Laterality: Right;   AMPUTATION TOE Left 06/12/2022   Procedure: AMPUTATION TOE; PARTIAL AMPUTATION LEFT GREAT TOE;  Surgeon: Linus Galas, DPM;  Location: [facility] ORS;  Service: Podiatry;  Laterality: Left;   CARDIAC CATHETERIZATION N/A 11/12/2015   Procedure: Left Heart Cath and Coronary Angiography;  Surgeon: Marcina Millard, MD;  Location: [facility] INVASIVE CV LAB;  Service: Cardiovascular;  Laterality: N/A;   COLONOSCOPY WITH  PROPOFOL N/A 07/20/2017   Procedure: COLONOSCOPY WITH PROPOFOL;  Surgeon: Wyline Mood, MD;  Location: [facility] ENDOSCOPY;  Service: Endoscopy;  Laterality: N/A;   JOINT REPLACEMENT     TOE AMPUTATION       Social History   Tobacco Use   Smoking status: Never   Smokeless tobacco: Never  Vaping Use   Vaping Use: Never used  Substance Use Topics   Alcohol use: No   Drug use: No     Family History  Problem Relation Age of Onset   CAD Mother    CAD Father   No bleeding or clotting disorders  Allergies  Allergen Reactions   Amoxicillin Diarrhea   Ampicillin Nausea And Vomiting and Other (See Comments)    Did it involve swelling of the face/tongue/throat, SOB, or low BP? Yes Did it involve sudden or severe rash/hives, skin peeling, or any reaction on the inside of your mouth or nose? No Did you need to seek medical attention at a hospital or doctor's office? No When did it last happen? >10 years If all above answers are "NO", may proceed with cephalosporin use.    Augmentin [Amoxicillin-Pot Clavulanate] Diarrhea   Bactrim [Sulfamethoxazole-Trimethoprim] Other (See Comments)    N/V/D   Codeine Nausea And Vomiting   Egg-Derived Products     ALLergic to cook or raw egg only. Food with egg ingredient is okay for patient.    Influenza Vaccines Other (See Comments)    Reaction:  Caused pt to pass out    Influenza Virus  Vaccine Other (See Comments)    Reaction: Passed out for 12 days   Methadone Hives and Itching   Oxycodone-Acetaminophen Nausea And Vomiting   Percocet [Oxycodone-Acetaminophen] Nausea And Vomiting   Sulfa Antibiotics Other (See Comments)    Reaction: unknown   Tetanus Antitoxin Swelling and Other (See Comments)    Reaction: injection site swelling   Tetanus Toxoids Swelling and Other (See Comments)    Reaction:  Swelling at injection site   Penicillin G Rash    Has patient had a PCN reaction causing immediate rash, facial/tongue/throat swelling, SOB or  lightheadedness with hypotension: Yes Has patient had a PCN reaction causing severe rash involving mucus membranes or skin necrosis: No Has patient had a PCN reaction that required hospitalization: No Has patient had a PCN reaction occurring within the last 10 years: No If all of the above answers are "NO", then may proceed with Cephalosporin use..   Tetracyclines & Related Rash       REVIEW OF SYSTEMS (Negative unless checked)   Constitutional: Weight loss  Fever  Chills Cardiac: Chest pain   Chest pressure   Palpitations   Shortness of breath when laying flat   Shortness of breath at rest   Shortness of breath with exertion. Vascular:  Pain in legs with walking   Pain in legs at rest   Pain in legs when laying flat   Claudication   Pain in feet when walking  Pain in feet at rest  Pain in feet when laying flat   History of DVT   Phlebitis   Swelling in legs   Varicose veins   Non-healing ulcers Pulmonary:   Uses home oxygen   Productive cough   Hemoptysis   Wheeze  COPD   Asthma Neurologic:  Dizziness  Blackouts   Seizures   History of stroke   History of TIA  Aphasia   Temporary blindness   Dysphagia   Weakness or numbness in arms   Weakness or numbness in legs Musculoskeletal:  Arthritis   Joint swelling   Joint pain   Low back pain Hematologic:  Easy bruising  Easy bleeding   Hypercoagulable state   Anemic  Hepatitis Gastrointestinal:  Blood in stool   Vomiting blood  Gastroesophageal reflux/heartburn   Abdominal pain Genitourinary:  Chronic kidney disease   Difficult urination  Frequent urination  Burning with urination   Hematuria Skin:  Rashes   Ulcers   Wounds Psychological:  History of anxiety    History of major depression.  Physical Examination  There were no vitals filed for this visit. There is no height or weight on file to calculate BMI. Gen: WD/WN,  NAD Head: [place]/AT, No temporalis wasting.  Ear/Nose/Throat: Hearing grossly intact, nares w/o erythema or drainage, oropharynx w/o Erythema/Exudate,  Eyes: Conjunctiva clear, sclera non-icteric Neck: Trachea midline.  No JVD.  Pulmonary:  Good air movement, respirations not labored, no use of accessory muscles.  Cardiac: RRR, normal S1, S2. Vascular:  Vessel Right Left  Radial Palpable Palpable                                   Gastrointestinal: soft, non-tender/non-distended. No guarding/reflex.  Musculoskeletal: M/S 5/5 throughout.  Extremities without ischemic changes.  No deformity or atrophy.  Neurologic: Sensation grossly intact in extremities.  Symmetrical.  Speech is fluent. Motor exam as listed above. Psychiatric: Judgment intact, Mood & affect appropriate for pt's clinical  situation. Dermatologic: No rashes or ulcers noted.  No cellulitis or open wounds.      CBC Lab Results  Component Value Date   WBC 10.5 11/17/2022   HGB 13.1 11/17/2022   HCT 41.2 11/17/2022   MCV 98.8 11/17/2022   PLT 298 11/17/2022    BMET    Component Value Date/Time   NA 139 11/17/2022 1521   NA 142 12/15/2014 0958   K 4.6 11/17/2022 1521   K 4.0 12/15/2014 0958   CL 104 11/17/2022 1521   CL 109 (H) 12/15/2014 0958   CO2 28 11/17/2022 1521   CO2 26 12/15/2014 0958   GLUCOSE 91 11/17/2022 1521   GLUCOSE 181 (H) 12/15/2014 0958   BUN 24 (H) 11/17/2022 1521   BUN 19 (H) 12/15/2014 0958   CREATININE 0.90 01/12/2023 1458   CREATININE 0.67 12/15/2014 0958   CALCIUM 9.5 11/17/2022 1521   CALCIUM 8.4 (L) 12/15/2014 0958   GFRNONAA >60 11/17/2022 1521   GFRNONAA >60 12/15/2014 0958   GFRAA >60 03/27/2020 0126   GFRAA >60 12/15/2014 0958   CrCl cannot be calculated (Patient's most recent lab result is older than the maximum 21 days allowed.).  COAG Lab Results  Component Value Date   INR 1.0 11/17/2022   INR 1.1 10/20/2019   INR 1.00 09/10/2018    Radiology No results  found.   Assessment/Plan HTN (hypertension) blood pressure control important in reducing the progression of atherosclerotic disease. On appropriate oral medications.     Type 2 diabetes mellitus with diabetic neuropathy (HCC) blood glucose control important in reducing the progression of atherosclerotic disease. Also, involved in wound healing. On appropriate medications.     Celiac artery stenosis (HCC) The patient has mesenteric duplex suggesting celiac artery stenosis and symptoms consistent with chronic mesenteric ischemia.  CT had suggested some degree of SMA stenosis as well.  Patient is getting a mesenteric angiogram today with possible intervention.  We also discussed the possibility of median arcuate ligament compression syndrome which would not be cured with stent placement.   Festus Barren, MD  04/05/2023 9:38 AM

## 2023-04-05 NOTE — Op Note (Signed)
[facility]  Percutaneous Study/Intervention Procedural Note   Date: 04/05/2023  Surgeon(s): Festus Barren, MD  Assistants: none  Pre-operative Diagnosis: 1.  Chronic Mesenteric ischemia 2. Celiac stenosis   Post-operative diagnosis:  Same with high grade SMA stenosis as well  Procedure(s) Performed:             1.  Ultrasound guidance for vascular access right femoral artery             2.  Catheter placement into SMA from right femoral approach             3.  Aortogram and selective angiogram of the superior mesenteric artery             4.  Stent to the SMA with 7 mm diameter x 26 mm length balloon expandable stent             5.  StarClose closure device right femoral artery  Contrast: 50 cc  Fluoro time: 2.6 minutes  EBL: 5 cc  Anesthesia: Approximately 17 minutes of Moderate conscious sedation using 2 mg of Versed and 50 mcg of Fentanyl              Indications:  Patient is a 77 y.o. female who has symptoms consistent with mesenteric ischemia. The patient has a CT scan showing calcific plaque of the celiac and superior mesenteric artery although degree of stenosis was difficult to discern.  Mesenteric duplex showed high-grade velocities in the celiac artery but did not appear to show high-grade SMA stenosis. The patient is brought in for angiography for further evaluation and potential treatment. Risks and benefits are discussed and informed consent is obtained  Procedure:  The patient was identified and appropriate procedural time out was performed.  The patient was then placed supine on the table and prepped and draped in the usual sterile fashion. Moderate conscious sedation was administered during a face to face encounter with the patient throughout the procedure with my supervision of the RN administering medicines and monitoring the patient's vital signs, pulse oximetry, telemetry and mental status throughout from the start of the procedure until the  patient was taken to the recovery room. Ultrasound was used to evaluate the right common femoral artery.  It was patent .  A digital ultrasound image was acquired.  A Seldinger needle was used to access the right common femoral artery under direct ultrasound guidance and a permanent image was performed.  A 0.035 J wire was advanced without resistance and a 5Fr sheath was placed.  Pigtail catheter was placed into the aorta and an AP aortogram was performed. This demonstrated fairly normal aorta and iliac arteries.  The renal arteries had only mild disease. We transitioned to the lateral projection to image the celiac and SMA. The lateral image demonstrated what appeared to be greater than 80% stenosis of both the celiac and superior mesenteric artery.  The patient was given 5000 units of IV heparin. We upsized to a 6 Fr sheath.  A CT catheter was used to selectively cannulate the.  Mesenteric artery.  This demonstrated a greater than 80% stenosis of the SMA. Based on her symptoms and these findings, I elected to treat the SMA to try to improve the patient's clinical course.  This would be the more important vessel than the celiac artery. I crossed the lesion without difficulty with a Glidewire and the C2 catheter and then exchanged for a supra core wire and placed a 6 [place]  Ansell sheath into the superior mesenteric artery. I then used a 7 mm diameter x 26 mm length balloon expandable stent to perform treatment of the superior mesenteric artery. I inflated the balloon to 14 atm. On completion angiogram following this, 10% residual stenosis was identified. At this point, I elected to terminate the procedure. The diagnostic catheter was removed. StarClose closure device was deployed in usual fashion with excellent hemostatic result. The patient was taken to the recovery room in stable condition having tolerated the procedure well.     Findings: Greater than 80% stenosis of both the celiac and superior mesenteric  arteries  Disposition: Patient was taken to the recovery room in stable condition having tolerated the procedure well.  Complications:  None  Festus Barren 04/05/2023 11:32 AM   This note was created with Dragon Medical transcription system. Any errors in dictation are purely unintentional.

## 2023-04-06 ENCOUNTER — Other Ambulatory Visit: Payer: Self-pay

## 2023-04-06 ENCOUNTER — Observation Stay
Admission: EM | Admit: 2023-04-06 | Discharge: 2023-04-07 | Disposition: A | Discharge status: Home / Self Care | Payer: Medicare HMO | Attending: Osteopathic Medicine | Admitting: Osteopathic Medicine

## 2023-04-06 ENCOUNTER — Emergency Department: Payer: Medicare HMO

## 2023-04-06 ENCOUNTER — Encounter: Payer: Self-pay | Admitting: Vascular Surgery

## 2023-04-06 DIAGNOSIS — F411 Generalized anxiety disorder: Secondary | ICD-10-CM | POA: Insufficient documentation

## 2023-04-06 DIAGNOSIS — E1140 Type 2 diabetes mellitus with diabetic neuropathy, unspecified: Secondary | ICD-10-CM | POA: Diagnosis present

## 2023-04-06 DIAGNOSIS — F419 Anxiety disorder, unspecified: Secondary | ICD-10-CM | POA: Diagnosis present

## 2023-04-06 DIAGNOSIS — I739 Peripheral vascular disease, unspecified: Secondary | ICD-10-CM | POA: Insufficient documentation

## 2023-04-06 DIAGNOSIS — Z955 Presence of coronary angioplasty implant and graft: Secondary | ICD-10-CM | POA: Insufficient documentation

## 2023-04-06 DIAGNOSIS — I10 Essential (primary) hypertension: Secondary | ICD-10-CM | POA: Insufficient documentation

## 2023-04-06 DIAGNOSIS — Z794 Long term (current) use of insulin: Secondary | ICD-10-CM | POA: Insufficient documentation

## 2023-04-06 DIAGNOSIS — G894 Chronic pain syndrome: Secondary | ICD-10-CM | POA: Insufficient documentation

## 2023-04-06 DIAGNOSIS — Z1152 Encounter for screening for COVID-19: Secondary | ICD-10-CM | POA: Insufficient documentation

## 2023-04-06 DIAGNOSIS — R0602 Shortness of breath: Secondary | ICD-10-CM | POA: Insufficient documentation

## 2023-04-06 DIAGNOSIS — M79604 Pain in right leg: Secondary | ICD-10-CM | POA: Diagnosis not present

## 2023-04-06 DIAGNOSIS — Z7902 Long term (current) use of antithrombotics/antiplatelets: Secondary | ICD-10-CM | POA: Diagnosis not present

## 2023-04-06 DIAGNOSIS — K219 Gastro-esophageal reflux disease without esophagitis: Secondary | ICD-10-CM | POA: Insufficient documentation

## 2023-04-06 DIAGNOSIS — F32A Depression, unspecified: Secondary | ICD-10-CM | POA: Diagnosis not present

## 2023-04-06 DIAGNOSIS — J9601 Acute respiratory failure with hypoxia: Secondary | ICD-10-CM | POA: Insufficient documentation

## 2023-04-06 DIAGNOSIS — I2510 Atherosclerotic heart disease of native coronary artery without angina pectoris: Secondary | ICD-10-CM | POA: Diagnosis not present

## 2023-04-06 DIAGNOSIS — E785 Hyperlipidemia, unspecified: Secondary | ICD-10-CM | POA: Insufficient documentation

## 2023-04-06 DIAGNOSIS — Z79899 Other long term (current) drug therapy: Secondary | ICD-10-CM

## 2023-04-06 DIAGNOSIS — E1142 Type 2 diabetes mellitus with diabetic polyneuropathy: Secondary | ICD-10-CM | POA: Diagnosis not present

## 2023-04-06 DIAGNOSIS — R1031 Right lower quadrant pain: Principal | ICD-10-CM

## 2023-04-06 DIAGNOSIS — I700 Atherosclerosis of aorta: Secondary | ICD-10-CM | POA: Diagnosis not present

## 2023-04-06 DIAGNOSIS — R5383 Other fatigue: Secondary | ICD-10-CM | POA: Insufficient documentation

## 2023-04-06 DIAGNOSIS — G8918 Other acute postprocedural pain: Principal | ICD-10-CM | POA: Insufficient documentation

## 2023-04-06 DIAGNOSIS — R2241 Localized swelling, mass and lump, right lower limb: Secondary | ICD-10-CM | POA: Diagnosis not present

## 2023-04-06 DIAGNOSIS — Z9889 Other specified postprocedural states: Secondary | ICD-10-CM

## 2023-04-06 DIAGNOSIS — F1190 Opioid use, unspecified, uncomplicated: Secondary | ICD-10-CM | POA: Diagnosis present

## 2023-04-06 DIAGNOSIS — Z9189 Other specified personal risk factors, not elsewhere classified: Secondary | ICD-10-CM

## 2023-04-06 LAB — TROPONIN I (HIGH SENSITIVITY)
Troponin I (High Sensitivity): 11 ng/L (ref <18)
Troponin I (High Sensitivity): 10 ng/L (ref <18)

## 2023-04-06 LAB — CBC WITH DIFFERENTIAL/PLATELET
Abs Immature Granulocytes: 0.02 10*3/uL (ref 0.00–0.07)
Basophils Absolute: 0.1 10*3/uL (ref 0.0–0.1)
Basophils Relative: 1 %
Eosinophils Absolute: 0.1 10*3/uL (ref 0.0–0.5)
Eosinophils Relative: 2 %
HCT: 41.7 % (ref 36.0–46.0)
Hemoglobin: 13 g/dL (ref 12.0–15.0)
Immature Granulocytes: 0 %
Lymphocytes Relative: 31 %
Lymphs Abs: 2.2 10*3/uL (ref 0.7–4.0)
MCH: 31.6 pg (ref 26.0–34.0)
MCHC: 31.2 g/dL (ref 30.0–36.0)
MCV: 101.2 fL — ABNORMAL HIGH (ref 80.0–100.0)
Monocytes Absolute: 0.6 10*3/uL (ref 0.1–1.0)
Monocytes Relative: 9 %
Neutro Abs: 4 10*3/uL (ref 1.7–7.7)
Neutrophils Relative %: 57 %
Platelets: 299 10*3/uL (ref 150–400)
RBC: 4.12 MIL/uL (ref 3.87–5.11)
RDW: 12.5 % (ref 11.5–15.5)
WBC: 7.1 10*3/uL (ref 4.0–10.5)
nRBC: 0 % (ref 0.0–0.2)

## 2023-04-06 LAB — CBG MONITORING, ED
Glucose-Capillary: 203 mg/dL — ABNORMAL HIGH (ref 70–99)
Glucose-Capillary: 250 mg/dL — ABNORMAL HIGH (ref 70–99)

## 2023-04-06 LAB — COMPREHENSIVE METABOLIC PANEL
ALT: 9 U/L (ref 0–44)
AST: 24 U/L (ref 15–41)
Albumin: 3.7 g/dL (ref 3.5–5.0)
Alkaline Phosphatase: 67 U/L (ref 38–126)
Anion gap: 9 (ref 5–15)
BUN: 20 mg/dL (ref 8–23)
CO2: 23 mmol/L (ref 22–32)
Calcium: 9 mg/dL (ref 8.9–10.3)
Chloride: 106 mmol/L (ref 98–111)
Creatinine, Ser: 0.75 mg/dL (ref 0.44–1.00)
GFR, Estimated: >60 mL/min (ref >60)
Glucose, Bld: 143 mg/dL — ABNORMAL HIGH (ref 70–99)
Potassium: 4.2 mmol/L (ref 3.5–5.1)
Sodium: 138 mmol/L (ref 135–145)
Total Bilirubin: 0.5 mg/dL (ref 0.3–1.2)
Total Protein: 6.2 g/dL — ABNORMAL LOW (ref 6.5–8.1)

## 2023-04-06 LAB — RESP PANEL BY RT-PCR (RSV, FLU A&B, COVID)  RVPGX2
Influenza A by PCR: NEGATIVE
Influenza B by PCR: NEGATIVE
Resp Syncytial Virus by PCR: NEGATIVE
SARS Coronavirus 2 by RT PCR: NEGATIVE

## 2023-04-06 LAB — PROCALCITONIN: Procalcitonin: <0.1 ng/mL

## 2023-04-06 MED ORDER — MIRTAZAPINE 15 MG PO TABS
15.0000 mg | ORAL_TABLET | Freq: Every day | ORAL | Status: DC
  Administered 2023-04-06: 15 mg via ORAL
  Filled 2023-04-06: qty 1

## 2023-04-06 MED ORDER — METHYLPREDNISOLONE SODIUM SUCC 125 MG IJ SOLR
80.0000 mg | Freq: Every day | INTRAMUSCULAR | Status: AC
  Administered 2023-04-07: 80 mg via INTRAVENOUS
  Filled 2023-04-06: qty 2

## 2023-04-06 MED ORDER — INSULIN ASPART 100 UNIT/ML IJ SOLN
0.0000 [IU] | Freq: Three times a day (TID) | INTRAMUSCULAR | Status: DC
  Administered 2023-04-06 – 2023-04-07 (×2): 5 [IU] via SUBCUTANEOUS
  Filled 2023-04-06 (×2): qty 1

## 2023-04-06 MED ORDER — METOPROLOL TARTRATE 25 MG PO TABS
25.0000 mg | ORAL_TABLET | Freq: Two times a day (BID) | ORAL | Status: DC
  Administered 2023-04-07: 25 mg via ORAL
  Filled 2023-04-06: qty 1

## 2023-04-06 MED ORDER — MORPHINE SULFATE 15 MG PO TABS
15.0000 mg | ORAL_TABLET | Freq: Two times a day (BID) | ORAL | Status: DC
  Administered 2023-04-06 – 2023-04-07 (×2): 15 mg via ORAL
  Filled 2023-04-06 (×2): qty 1

## 2023-04-06 MED ORDER — PREGABALIN 50 MG PO CAPS
100.0000 mg | ORAL_CAPSULE | Freq: Three times a day (TID) | ORAL | Status: DC
  Administered 2023-04-06 – 2023-04-07 (×2): 100 mg via ORAL
  Filled 2023-04-06 (×2): qty 2

## 2023-04-06 MED ORDER — TRAZODONE HCL 100 MG PO TABS
100.0000 mg | ORAL_TABLET | Freq: Every day | ORAL | Status: DC
  Administered 2023-04-06: 100 mg via ORAL
  Filled 2023-04-06: qty 1

## 2023-04-06 MED ORDER — VENLAFAXINE HCL ER 75 MG PO CP24
75.0000 mg | ORAL_CAPSULE | Freq: Two times a day (BID) | ORAL | Status: DC
  Administered 2023-04-06 – 2023-04-07 (×2): 75 mg via ORAL
  Filled 2023-04-06 (×2): qty 1

## 2023-04-06 MED ORDER — AZITHROMYCIN 500 MG PO TABS
500.0000 mg | ORAL_TABLET | Freq: Once | ORAL | Status: AC
  Administered 2023-04-06: 500 mg via ORAL
  Filled 2023-04-06: qty 1

## 2023-04-06 MED ORDER — LISINOPRIL 10 MG PO TABS
20.0000 mg | ORAL_TABLET | Freq: Every day | ORAL | Status: DC
  Administered 2023-04-07: 20 mg via ORAL
  Filled 2023-04-06: qty 2

## 2023-04-06 MED ORDER — IOHEXOL 350 MG/ML SOLN
75.0000 mL | Freq: Once | INTRAVENOUS | Status: AC | PRN
  Administered 2023-04-06: 75 mL via INTRAVENOUS

## 2023-04-06 MED ORDER — HYDROMORPHONE HCL 1 MG/ML IJ SOLN
0.5000 mg | Freq: Once | INTRAMUSCULAR | Status: AC
  Administered 2023-04-06: 0.5 mg via INTRAVENOUS
  Filled 2023-04-06: qty 0.5

## 2023-04-06 MED ORDER — CLOPIDOGREL BISULFATE 75 MG PO TABS
75.0000 mg | ORAL_TABLET | Freq: Every day | ORAL | Status: DC
  Administered 2023-04-06 – 2023-04-07 (×2): 75 mg via ORAL
  Filled 2023-04-06 (×2): qty 1

## 2023-04-06 MED ORDER — ACETAMINOPHEN 325 MG PO TABS
650.0000 mg | ORAL_TABLET | Freq: Four times a day (QID) | ORAL | Status: DC | PRN
  Administered 2023-04-07: 650 mg via ORAL
  Filled 2023-04-06: qty 2

## 2023-04-06 MED ORDER — ASPIRIN 81 MG PO TBEC
81.0000 mg | DELAYED_RELEASE_TABLET | Freq: Every day | ORAL | Status: DC
  Administered 2023-04-06 – 2023-04-07 (×2): 81 mg via ORAL
  Filled 2023-04-06 (×2): qty 1

## 2023-04-06 MED ORDER — HEPARIN SODIUM (PORCINE) 5000 UNIT/ML IJ SOLN: 5000.0000 [IU] | Freq: Three times a day (TID) | INTRAMUSCULAR | Status: DC

## 2023-04-06 MED ORDER — ALPRAZOLAM 0.5 MG PO TABS
0.5000 mg | ORAL_TABLET | Freq: Four times a day (QID) | ORAL | Status: DC | PRN
  Administered 2023-04-06 – 2023-04-07 (×2): 0.5 mg via ORAL
  Filled 2023-04-06 (×2): qty 1

## 2023-04-06 MED ORDER — ONDANSETRON HCL 4 MG/2ML IJ SOLN: 4.0000 mg | Freq: Four times a day (QID) | INTRAMUSCULAR | Status: DC | PRN

## 2023-04-06 MED ORDER — ACETAMINOPHEN 650 MG RE SUPP: 650.0000 mg | Freq: Four times a day (QID) | RECTAL | Status: DC | PRN

## 2023-04-06 MED ORDER — MORPHINE SULFATE 15 MG PO TABS: 15.0000 mg | ORAL_TABLET | Freq: Two times a day (BID) | ORAL | Status: DC | PRN

## 2023-04-06 MED ORDER — SENNOSIDES-DOCUSATE SODIUM 8.6-50 MG PO TABS: 1.0000 | ORAL_TABLET | Freq: Every evening | ORAL | Status: DC | PRN

## 2023-04-06 MED ORDER — ONDANSETRON HCL 4 MG PO TABS: 4.0000 mg | ORAL_TABLET | Freq: Four times a day (QID) | ORAL | Status: DC | PRN

## 2023-04-06 MED ORDER — METHYLPREDNISOLONE SODIUM SUCC 125 MG IJ SOLR
125.0000 mg | Freq: Once | INTRAMUSCULAR | Status: AC
  Administered 2023-04-06: 125 mg via INTRAVENOUS
  Filled 2023-04-06: qty 2

## 2023-04-06 MED ORDER — ATORVASTATIN CALCIUM 20 MG PO TABS
20.0000 mg | ORAL_TABLET | Freq: Every day | ORAL | Status: DC
  Administered 2023-04-06: 20 mg via ORAL
  Filled 2023-04-06: qty 1

## 2023-04-06 MED ORDER — INSULIN ASPART 100 UNIT/ML IJ SOLN
0.0000 [IU] | Freq: Every day | INTRAMUSCULAR | Status: DC
  Administered 2023-04-06: 2 [IU] via SUBCUTANEOUS
  Filled 2023-04-06: qty 1

## 2023-04-06 MED ORDER — ISOSORBIDE MONONITRATE ER 60 MG PO TB24
30.0000 mg | ORAL_TABLET | Freq: Every day | ORAL | Status: DC
  Administered 2023-04-06 – 2023-04-07 (×2): 30 mg via ORAL
  Filled 2023-04-06 (×2): qty 1

## 2023-04-06 MED ORDER — IPRATROPIUM-ALBUTEROL 0.5-2.5 (3) MG/3ML IN SOLN: 3.0000 mL | Freq: Four times a day (QID) | RESPIRATORY_TRACT | Status: DC | PRN

## 2023-04-06 MED ORDER — SODIUM CHLORIDE 0.9 % IV SOLN
1.0000 g | Freq: Once | INTRAVENOUS | Status: AC
  Administered 2023-04-06: 1 g via INTRAVENOUS
  Filled 2023-04-06: qty 10

## 2023-04-06 MED ORDER — ONDANSETRON HCL 4 MG/2ML IJ SOLN
4.0000 mg | Freq: Once | INTRAMUSCULAR | Status: AC
  Administered 2023-04-06: 4 mg via INTRAVENOUS
  Filled 2023-04-06: qty 2

## 2023-04-06 MED ORDER — LORAZEPAM 2 MG/ML IJ SOLN: 1.0000 mg | Freq: Four times a day (QID) | INTRAMUSCULAR | Status: DC | PRN

## 2023-04-06 NOTE — Assessment & Plan Note (Signed)
-   Insulin SSI with at bedtime coverage ordered ?- Goal inpatient blood glucose levels 140-180 ?

## 2023-04-06 NOTE — Assessment & Plan Note (Addendum)
Resumed morphine 15 mg p.o. twice daily BID Resumed home Lyrica 100 mg p.o. 3 times daily Holding home Flexeril on admission due to acute hypoxic respiratory failure

## 2023-04-06 NOTE — Assessment & Plan Note (Addendum)
Right external artery ultrasound and venous ultrasound of the right leg were negative Patient is status post SMA stent placement on 04/05/2023 Vascular surgery has been consulted via secure chat, vascular team will see the patient tomorrow Patient may benefit from PT, OT evaluation once her oxygen improves back to baseline

## 2023-04-06 NOTE — H&P (Signed)
History and Physical   Jessica Donovan ZOX:[id number] DOB: [birth year] DOA: 04/06/2023  PCP: Mick Sell, MD  Outpatient Specialists: Dr. Wyn Quaker, vascular Patient coming from: home via EMS  I have personally briefly reviewed patient's old medical records in [facility] EMR.  Chief Concern: Shortness of breath, right leg pain  HPI: 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  Vitals in the ED showed temperature of 98, respiration rate of 18, heart rate 68, blood pressure 116/65, SpO2 of 91% on 2 L nasal cannula.  Initially in the emergency department, patient's oxygen saturation desatted to 88% on room air.  Upon trial of O2 supplementation removal, per ED report, patient SpO2 decreased to 86% on room air.  Serum sodium is 138, potassium 4.2, chloride 106, bicarb 23, BUN of 20, serum creatinine of 0.74, EGFR greater than 60, nonfasting blood glucose 143, WBC 7.1, hemoglobin 13, platelets of 299.  High sensitivity troponin was 11, on repeat was 10.  COVID/influenza A/influenza B/RSV PCR were negative.  ED treatment: Azithromycin 500 mg p.o. one-time dose, Dilaudid 0.5 mg IV one-time dose, Solu-Medrol 125 mg IV one-time dose, ondansetron 4 mg IV one-time dose, ceftriaxone 1 g IV one-time dose. ----------------------------- At bedside, patient was able to tell me her name, age, location, current calendar year.  She reports she started having right leg pain after surgery.  She states she cannot bear weight and walk.  She also endorses  swelling compared to the left leg.  She states her right leg started hurting and she can not walk.  She denies fever, nausea, vomiting, chest pain, shortness of breath, dysuria, hematuria, diarrhea.  Social history: She lives on her own. She denies tobacco, etoh, and recreational drug use. She was Librarian, academic in assisted living.   ROS: Constitutional: no weight change, no fever ENT/Mouth: no sore throat, no rhinorrhea Eyes: no eye pain, no vision changes Cardiovascular: no chest pain, no dyspnea,  no edema, no palpitations Respiratory: no cough, no sputum, no wheezing Gastrointestinal: no nausea, no vomiting, no diarrhea, no constipation Genitourinary: no urinary incontinence, no dysuria, no hematuria Musculoskeletal: no arthralgias, no myalgias, + right leg pain Skin: no skin lesions, no pruritus, Neuro: + weakness, no loss of consciousness, no syncope Psych: no anxiety, no depression, + decreased appetite Heme/Lymph: no bruising, no bleeding  ED Course: Discussed with emergency medicine provider, patient requiring hospitalization for chief concerns of acute hypoxic respiratory failure.  Assessment/Plan  Principal Problem:   Acute hypoxemic respiratory failure Active Problems:   Anxiety   Polypharmacy   CAD S/P percutaneous coronary angioplasty   Chronic narcotic use   Chronic pain syndrome   Diabetic polyneuropathy   Generalized anxiety disorder   Type 2 diabetes mellitus with diabetic neuropathy   HTN (hypertension)   S/P cardiac catheterization   At risk for polypharmacy   Leg pain, diffuse, right   PAD (peripheral artery disease)   Assessment and Plan:  * Acute hypoxemic respiratory failure Etiology workup in progress at this time Complicated by recent vascular surgery and multiple sedating medication including Flexeril, pregabalin, trazodone, p.o. morphine 15 mg tablets Avoid excessive sedating medication on  admission, however patient is very dependent on  pain control medications Home Flexeril not resumed on admission, I discussed with patient and she is in agreement with not getting her Flexeril on day of admission CTA of the chest on day of admission was read as interstitial septal thickening with scattered patchy in distribution ill-defined groundglass bilaterally which is a change compared to CT of the chest with contrast in 10/20/2019 Check procalcitonin, if positive will initiate antibiotic coverage for commune acquired pneumonia/atypical pneumonia DuoNebs every 6 hours.  For wheezing and shortness of breath, 2 days ordered Status post Solu-Medrol 125 mg IV one-time dose per EDP Ordered Solu-Medrol 80 mg IV daily, one-time dose for 04/07/2023 Admit to telemetry cardiac, inpatient  Anxiety Ativan 1 mg IV every 6 hours as needed for anxiety, 4 doses ordered  PAD (peripheral artery disease) Resumed aspirin 81 mg daily Patient is prescribed Plavix and was told to start Plavix on 04/05/2023, patient has not been able to start due to delay from pharmacy and due to her pain Plavix 75 mg daily started  Leg pain, diffuse, right Right external artery ultrasound and venous ultrasound of the right leg were negative Patient is status post SMA stent placement on 04/05/2023 Vascular surgery has been consulted via secure chat, vascular team will see the patient tomorrow Patient may benefit from PT, OT evaluation once her oxygen improves back to baseline  At risk for polypharmacy On PDMP, patient has prescription for pregabalin 100 mg tablet, morphine 15 mg tablet, alprazolam 0.5 mg tablet Holding home Flexeril 10 mg tablet 3 times daily as needed for muscle spasms on admission Avoid further sedating medication, however patient is dependent on pain control medication Resumed home morphine immediate release 15 mg p.o. twice daily, alprazolam 0.5 mg every 6 hours as needed for anxiety, pregabalin 100 mg 3 times daily  Type 2 diabetes mellitus with  diabetic neuropathy Insulin SSI with at bedtime coverage ordered Goal inpatient blood glucose levels 140-180  Chronic pain syndrome Resumed morphine 15 mg p.o. twice daily BID Resumed home Lyrica 100 mg p.o. 3 times daily Holding home Flexeril on admission due to acute hypoxic respiratory failure  CAD S/P percutaneous coronary angioplasty Status post DES PCI from mid circumflex distal circumflex in 2014 Left heart cath in 2016 showed patent DES and insignificant CAD  Chart reviewed.   DVT prophylaxis: Heparin 5000 units subcutaneous every 8 hours starting on 04/07/2023 Code Status: DNR, MOST form and ACP Diet: Heart healthy/carb modified Family Communication: I offered to update her family, patient states she has no living relative Disposition Plan: Pending clinical course Consults called: Vascular Admission status: Telemetry cardiac, inpatient  Past Medical History:  Diagnosis Date   Anxiety    Chronic back pain    Collagen vascular disease    Coronary artery disease    a. s/p PCI/DES to dRCA & mLCx in 2014 with repeat LHC in 10/2015 showing patent stents   Diabetes mellitus without complication    Hypertension    Low back pain    Myocardial infarction    Osteomyelitis of toe 01/23/2016   Stroke    Past Surgical History:  Procedure Laterality Date   AMPUTATION TOE Left 11/10/2015   Procedure: AMPUTATION TOE;  Surgeon: Linus Galas, MD;  Location: [facility] ORS;  Service: Podiatry;  Laterality: Left;   AMPUTATION TOE Left 01/24/2016   Procedure: AMPUTATION TOE (2nd mpj);  Surgeon: Linus Galas, DPM;  Location: [facility] ORS;  Service: Podiatry;  Laterality: Left;   AMPUTATION  TOE Right 12/21/2018   Procedure: 2ND TOE AMPUTATION WITH DEBRIDEMENT OF SOFT TISSUE;  Surgeon: Recardo Evangelist, DPM;  Location: [facility] ORS;  Service: Podiatry;  Laterality: Right;   AMPUTATION TOE Left 06/12/2022   Procedure: AMPUTATION TOE; PARTIAL AMPUTATION LEFT GREAT TOE;  Surgeon: Linus Galas, DPM;  Location: [facility]  ORS;  Service: Podiatry;  Laterality: Left;   CARDIAC CATHETERIZATION N/A 11/12/2015   Procedure: Left Heart Cath and Coronary Angiography;  Surgeon: Marcina Millard, MD;  Location: [facility] INVASIVE CV LAB;  Service: Cardiovascular;  Laterality: N/A;   COLONOSCOPY WITH PROPOFOL N/A 07/20/2017   Procedure: COLONOSCOPY WITH PROPOFOL;  Surgeon: Wyline Mood, MD;  Location: [facility] ENDOSCOPY;  Service: Endoscopy;  Laterality: N/A;   JOINT REPLACEMENT     TOE AMPUTATION     VISCERAL ANGIOGRAPHY N/A 04/05/2023   Procedure: VISCERAL ANGIOGRAPHY;  Surgeon: Annice Needy, MD;  Location: [facility] INVASIVE CV LAB;  Service: Cardiovascular;  Laterality: N/A;   Social History:  reports that she has never smoked. She has never used smokeless tobacco. She reports that she does not drink alcohol and does not use drugs.  Allergies  Allergen Reactions   Amoxicillin Diarrhea   Ampicillin Nausea And Vomiting and Other (See Comments)    Did it involve swelling of the face/tongue/throat, SOB, or low BP? Yes Did it involve sudden or severe rash/hives, skin peeling, or any reaction on the inside of your mouth or nose? No Did you need to seek medical attention at a hospital or doctor's office? No When did it last happen? >10 years If all above answers are "NO", may proceed with cephalosporin use.    Augmentin [Amoxicillin-Pot Clavulanate] Diarrhea   Bactrim [Sulfamethoxazole-Trimethoprim] Other (See Comments)    N/V/D   Codeine Nausea And Vomiting   Egg-Derived Products     ALLergic to cook or raw egg only. Food with egg ingredient is okay for patient.    Influenza Vaccines Other (See Comments)    Reaction:  Caused pt to pass out    Influenza Virus Vaccine Other (See Comments)    Reaction: Passed out for 12 days   Methadone Hives and Itching   Oxycodone-Acetaminophen Nausea And Vomiting   Percocet [Oxycodone-Acetaminophen] Nausea And Vomiting   Sulfa Antibiotics Other (See Comments)    Reaction: unknown   Tetanus  Antitoxin Swelling and Other (See Comments)    Reaction: injection site swelling   Tetanus Toxoids Swelling and Other (See Comments)    Reaction:  Swelling at injection site   Penicillin G Rash    Has patient had a PCN reaction causing immediate rash, facial/tongue/throat swelling, SOB or lightheadedness with hypotension: Yes Has patient had a PCN reaction causing severe rash involving mucus membranes or skin necrosis: No Has patient had a PCN reaction that required hospitalization: No Has patient had a PCN reaction occurring within the last 10 years: No If all of the above answers are "NO", then may proceed with Cephalosporin use..   Tetracyclines & Related Rash   Family History  Problem Relation Age of Onset   CAD Mother    CAD Father    Family history: Family history reviewed and not pertinent.  Prior to Admission medications   Medication Sig Start Date End Date Taking? Authorizing Provider  acetaminophen (TYLENOL) 500 MG tablet Take 1,000 mg by mouth every 8 (eight) hours as needed for mild pain or headache.    Yes [provider]  ALPRAZolam Prudy Feeler) 0.5 MG tablet Take 0.5 mg by mouth 4 (four)  times daily as needed for anxiety. 10/16/22  Yes [provider]  AMBULATORY NON FORMULARY MEDICATION Compounded estrogen  Apply 1 gram vaginally M,W, Friday [facility] 01/28/21  Yes Vanna Scotland, MD  aspirin 81 MG tablet Take 81 mg by mouth daily.   Yes [provider]  atorvastatin (LIPITOR) 20 MG tablet Take 20 mg by mouth daily.    Yes [provider]  celecoxib (CELEBREX) 200 MG capsule Take 200 mg by mouth daily.   Yes [provider]  clopidogrel (PLAVIX) 75 MG tablet Take 1 tablet (75 mg total) by mouth daily. 04/05/23  Yes Dew, Marlow Baars, MD  cyclobenzaprine (FLEXERIL) 10 MG tablet Take 10 mg by mouth 3 (three) times daily as needed.  02/12/20  Yes [provider]  insulin aspart (NOVOLOG) 100 UNIT/ML injection Inject 3-15  Units into the skin 3 (three) times daily with meals as needed for high blood sugar. Pt uses as needed per sliding scale:    Less than 140:  0 units  140-180:  3 units 181-220:  4 units 221- 260:  6 units 261- 320:  8 units 321-360:  10 units 361-400:  12 units Greater than 400:  15 units   Yes [provider]  isosorbide mononitrate (IMDUR) 30 MG 24 hr tablet Take 30 mg by mouth daily. 11/11/20  Yes [provider]  levofloxacin (LEVAQUIN) 500 MG tablet Take 500 mg by mouth daily. 03/19/23  Yes [provider]  lisinopril (ZESTRIL) 20 MG tablet Take 20 mg by mouth daily. 11/11/20  Yes [provider]  metoprolol tartrate (LOPRESSOR) 25 MG tablet Take 1 tablet (25 mg total) by mouth 2 (two) times daily. 11/17/20 04/06/23 Yes Jerald Kief, MD  mirtazapine (REMERON) 15 MG tablet Take 15 mg by mouth at bedtime.   Yes [provider]  morphine (MSIR) 15 MG tablet Take 15 mg by mouth 2 (two) times daily. 11/16/22  Yes [provider]  Multiple Vitamins-Minerals (HAIR SKIN AND NAILS FORMULA PO) Take by mouth daily. 2 gummies daily   Yes [provider]  omeprazole (PRILOSEC) 40 MG capsule Take 1 capsule (40 mg total) by mouth daily. 06/24/22  Yes Darlin Priestly, MD  pregabalin (LYRICA) 100 MG capsule Take 100 mg by mouth 3 (three) times daily. 06/11/22 06/11/23 Yes [provider]  promethazine (PHENERGAN) 25 MG tablet Take 25 mg by mouth every 8 (eight) hours as needed. 12/25/22  Yes [provider]  TOUJEO SOLOSTAR 300 UNIT/ML SOPN Inject 60 Units into the skin every morning. 08/08/19  Yes [provider]  traZODone (DESYREL) 100 MG tablet Take 100 mg by mouth at bedtime.   Yes [provider]  venlafaxine XR (EFFEXOR-XR) 75 MG 24 hr capsule Take 75 mg by mouth 2 (two) times daily.   Yes [provider]  Vitamin D, Ergocalciferol, (DRISDOL) 50000 units CAPS capsule Take 50,000 Units by mouth every  Sunday.   Yes [provider]  ACCU-CHEK GUIDE test strip TEST 3 TIMES DAILY AS DIRECTED 11/30/22   [provider]  Blood Glucose Monitoring Suppl (ONE TOUCH ULTRA 2) w/Device KIT SMARTSIG:1 Via Meter Daily 01/23/23   [provider]  Lancets (ONETOUCH DELICA PLUS LANCET33G) MISC Apply topically. 01/28/23   [provider]  polyvinyl alcohol (LIQUIFILM TEARS) 1.4 % ophthalmic solution Place 1 drop into the right eye 4 (four) times daily. 11/18/22   Arnetha Courser, MD   Physical Exam: Vitals:   04/06/23 1024 04/06/23  1025 04/06/23 1333 04/06/23 1739  BP: 116/65  102/60 125/66  Pulse: 68  66 85  Resp: 18  18 16   Temp: 98 F (36.7 C)  98.2 F (36.8 C) 98 F (36.7 C)  SpO2: 91%  96% 97%  Weight:  83.9 kg    Height:  5\' 2"  (1.575 m)     Constitutional: appears age-appropriate, NAD, calm, comfortable Eyes: PERRL, lids and conjunctivae normal ENMT: Mucous membranes are moist. Posterior pharynx clear of any exudate or lesions. Age-appropriate dentition. Hearing appropriate Neck: normal, supple, no masses, no thyromegaly Respiratory: clear to auscultation bilaterally, no wheezing, no crackles. Normal respiratory effort. No accessory muscle use.  Cardiovascular: Regular rate and rhythm, no murmurs / rubs / gallops. No extremity edema. 2+ pedal pulses. No carotid bruits.  Abdomen: Obese abdomen, no tenderness, no masses palpated, no hepatosplenomegaly. Bowel sounds positive.  Musculoskeletal: no clubbing / cyanosis. No joint deformity upper and lower extremities. Good ROM, no contractures, no atrophy. Normal muscle tone.  Skin: no rashes, lesions, ulcers. No induration.  The skin in the medial right thigh is visualized and negative for ecchymosis, concerns for active bleeding.  Minimal swelling compared to the left side. Neurologic: Sensation intact. Strength 5/5 in all 4.  Psychiatric: Normal judgment and insight. Alert and oriented x 3.  Depressed and tearful  mood.   EKG: independently reviewed, showing sinus rhythm with rate of 66, QTc 431  Chest x-ray on Admission: I personally reviewed and I agree with radiologist reading as below.  [place] Lower Ext Art Right Ltd  Result Date: 04/06/2023 CLINICAL DATA:  Leg pain and swelling, post femoral catheterization EXAM: RIGHT LOWER EXTREMITY ARTERIAL DUPLEX SCAN (limited) TECHNIQUE: Gray-scale sonography as well as color Doppler and duplex ultrasound was performed to evaluate the lower extremity arteries in the right groin area of concern. COMPARISON:  None Available. FINDINGS: Right lower Extremity Right groin area of concern interrogated with gray and color Doppler imaging. Right common femoral artery and vein appear patent. No superficial soft tissue abnormality, hemorrhage, hematoma, vascular abnormality, or pseudoaneurysm appreciated. Common femoral vein is also patent and compressible. IMPRESSION: Negative right groin ultrasound. Electronically Signed   By: Judie Petit.  Shick M.D.   On: 04/06/2023 14:06   CT Angio Chest PE W and/or Wo Contrast  Result Date: 04/06/2023 CLINICAL DATA:  Postop.  Leg swelling and pain. EXAM: CT ANGIOGRAPHY CHEST WITH CONTRAST TECHNIQUE: Multidetector CT imaging of the chest was performed using the standard protocol during bolus administration of intravenous contrast. Multiplanar CT image reconstructions and MIPs were obtained to evaluate the vascular anatomy. RADIATION DOSE REDUCTION: This exam was performed according to the departmental dose-optimization program which includes automated exposure control, adjustment of the mA and/or kV according to patient size and/or use of iterative reconstruction technique. CONTRAST:  75mL OMNIPAQUE IOHEXOL 350 MG/ML SOLN COMPARISON:  Chest x-ray earlier 04/06/2023.  Chest CT 10/20/2019. FINDINGS: Cardiovascular: No pulmonary embolism identified. Heart is nonenlarged. Trace pericardial effusion. The thoracic aorta has a normal course and caliber with some  vascular calcifications. Coronary artery calcifications are noted. Mediastinum/Nodes: Normal caliber thoracic esophagus. Preserved thyroid gland. No specific abnormal lymph node enlargement identified in the axillary regions, hilum or mediastinum Lungs/Pleura: Breathing motion seen. There are areas of interstitial septal thickening with scattered patchy in distribution ill-defined ground-[place]labach bilaterally. No pneumothorax, effusion. No consolidation. Upper Abdomen: What is seen of the adrenal glands are preserved. These incompletely included in the imaging field. Musculoskeletal: Moderate degenerative changes seen along the spine. Degenerative changes  seen of the shoulders. Review of the MIP images confirms the above findings. IMPRESSION: No pulmonary embolism identified. Patchy scattered bilateral areas of ground-Ritzel with some interstitial septal thickening. Nonspecific. No consolidation. Recommend follow-up. Aortic Atherosclerosis (ICD10-I70.0). Electronically Signed   By: Karen Kays M.D.   On: 04/06/2023 13:18   US Venous Img Lower Unilateral Right  Result Date: 04/06/2023 CLINICAL DATA:  Leg pain and swelling EXAM: Right LOWER EXTREMITY VENOUS DOPPLER ULTRASOUND TECHNIQUE: Gray-scale sonography with compression, as well as color and duplex ultrasound, were performed to evaluate the deep venous system(s) from the level of the common femoral vein through the popliteal and proximal calf veins. COMPARISON:  None Available. FINDINGS: VENOUS Normal compressibility of the common femoral, superficial femoral, and popliteal veins, as well as the visualized calf veins. Visualized portions of profunda femoral vein and great saphenous vein unremarkable. No filling defects to suggest DVT on grayscale or color Doppler imaging. Doppler waveforms show normal direction of venous flow, normal respiratory plasticity and response to augmentation. Limited views of the contralateral common femoral vein are unremarkable. OTHER  None. Limitations: none IMPRESSION: No evidence of right lower extremity DVT. Electronically Signed   By: Allegra Lai M.D.   On: 04/06/2023 12:23   DG Chest 2 View  Result Date: 04/06/2023 CLINICAL DATA:  Shortness of breath. EXAM: CHEST - 2 VIEW COMPARISON:  11/14/2020. FINDINGS: Low lung volumes accentuate the pulmonary vasculature and cardiomediastinal silhouette. No focal airspace opacity. No pleural effusion or pneumothorax. IMPRESSION: No evidence of acute cardiopulmonary disease. Electronically Signed   By: Orvan Falconer M.D.   On: 04/06/2023 11:41   PERIPHERAL VASCULAR CATHETERIZATION  Result Date: 04/05/2023 See surgical note for result.   Labs on Admission: I have personally reviewed following labs  CBC: Recent Labs  Lab 04/06/23 1126  WBC 7.1  NEUTROABS 4.0  HGB 13.0  HCT 41.7  MCV 101.2*  PLT 299   Basic Metabolic Panel: Recent Labs  Lab 04/05/23 1028 04/06/23 1126  NA  --  138  K  --  4.2  CL  --  106  CO2  --  23  GLUCOSE  --  143*  BUN 21 20  CREATININE 0.89 0.75  CALCIUM  --  9.0   GFR: Estimated Creatinine Clearance: 60.1 mL/min (by C-G formula based on SCr of 0.75 mg/dL).  Liver Function Tests: Recent Labs  Lab 04/06/23 1126  AST 24  ALT 9  ALKPHOS 67  BILITOT 0.5  PROT 6.2*  ALBUMIN 3.7   CBG: Recent Labs  Lab 04/05/23 1027 04/05/23 1146 04/06/23 1833  GLUCAP 59* 135* 203*   Urine analysis:    Component Value Date/Time   COLORURINE YELLOW (A) 11/18/2022 0630   APPEARANCEUR CLEAR (A) 11/18/2022 0630   APPEARANCEUR Cloudy (A) 06/03/2020 1559   LABSPEC 1.009 11/18/2022 0630   LABSPEC 1.014 12/15/2014 0958   PHURINE 5.0 11/18/2022 0630   GLUCOSEU NEGATIVE 11/18/2022 0630   GLUCOSEU Negative 12/15/2014 0958   HGBUR NEGATIVE 11/18/2022 0630   BILIRUBINUR NEGATIVE 11/18/2022 0630   BILIRUBINUR Negative 06/03/2020 1559   BILIRUBINUR Negative 12/15/2014 0958   KETONESUR NEGATIVE 11/18/2022 0630   PROTEINUR NEGATIVE 11/18/2022  0630   NITRITE NEGATIVE 11/18/2022 0630   LEUKOCYTESUR NEGATIVE 11/18/2022 0630   LEUKOCYTESUR 1+ 12/15/2014 0958   This document was prepared using Dragon Voice Recognition software and may include unintentional dictation errors.  Dr. Sedalia Muta [facility]  If 7PM-7AM, please contact overnight-coverage provider If 7AM-7PM, please contact day coverage provider [email or web address]  04/06/2023, 6:50 PM

## 2023-04-06 NOTE — Hospital Course (Addendum)
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 04/16: 88% on room air --> SpO2 of 91% on 2 L nasal cannula. CTA chest was read as interstitial septal thickening with scattered patchy in distribution ill-defined groundglass bilaterally which is a change compared to CT of the chest with contrast in 10/20/2019. ED treatment: Azithromycin 500 mg p.o. one-time dose, Dilaudid 0.5 mg IV one-time dose, Solu-Medrol 125 mg IV one-time dose, ondansetron 4 mg IV one-time dose, ceftriaxone 1 g IV one-time dose. Admitted to hospitalist service. Right external artery ultrasound and venous ultrasound of the right leg were negative.  04/17: SpO2 100% RA. Her pain to RLE is much much better this morning.  No further workup is needed per vascular surgery team, they state it is okay to discharge today from their standpoint.  She was instructed to make sure she takes her aspirin and her Plavix daily as ordered.  ***  Consultants:  Vascular Surgery   Procedures: ***      ASSESSMENT & PLAN:   Principal Problem:   Acute hypoxemic respiratory failure Active Problems:   Anxiety   Polypharmacy   CAD S/P percutaneous coronary angioplasty   Chronic narcotic use   Chronic pain syndrome   Diabetic polyneuropathy   Generalized anxiety disorder   Type 2 diabetes mellitus with diabetic neuropathy   HTN (hypertension)   S/P cardiac catheterization   At risk for polypharmacy   Leg pain, diffuse, right   PAD (peripheral artery disease)  Acute hypoxemic respiratory failure  - resolved Complicated by recent vascular surgery and multiple sedating medication including Flexeril, pregabalin, trazodone, p.o. morphine 15 mg tablets Avoid excessive sedating medication on admission, however patient is very dependent on pain control medications Hold home Flexeril Procalcitonin <0.10, holding abx at thist ime  DuoNebs every 6 hours prn Solu-Medrol 80 mg IV daily, one-time dose for 04/07/2023   Anxiety Ativan 1 mg IV every 6 hours as needed for anxiety, 4 doses ordered   PAD (peripheral artery disease) Patient is prescribed Plavix and was told to start Plavix on 04/05/2023, patient has not been able to start, she reports due to delay from pharmacy and due to her pain Resumed aspirin 81 mg daily Plavix 75 mg daily started Per vascular surgery, no complication from procedure, ok to discharge today from their standpoint and follow outpatient    Leg pain, diffuse, right Patient is status post SMA stent placement on 04/05/2023 Vascular surgery has been consulted, *** Patient may benefit from PT, OT evaluation once her oxygen improves back to baseline   At risk for polypharmacy On PDMP, patient has prescription for pregabalin 100 mg tablet, morphine 15 mg tablet, alprazolam 0.5 mg tablet Holding home Flexeril 10 mg tablet 3 times daily as needed for muscle spasms on admission Avoid further sedating medication as able but recognize that this patient is dependent on pain control medication Resumed home morphine immediate release 15 mg p.o. twice daily, alprazolam 0.5 mg every 6 hours as needed for anxiety, pregabalin 100 mg 3 times daily   Type 2 diabetes mellitus with diabetic neuropathy Insulin SSI with at bedtime coverage ordered Goal inpatient blood glucose  levels 140-180   Chronic pain syndrome Resumed morphine 15 mg p.o. twice daily BID Resumed home Lyrica 100 mg p.o. 3 times daily Holding home Flexeril on admission due to acute hypoxic respiratory failure   CAD S/P  percutaneous coronary angioplasty Status post DES PCI from mid circumflex distal circumflex in 2014 Left heart cath in 2016 showed patent DES and insignificant CAD     DVT prophylaxis: *** Pertinent IV fluids/nutrition: *** Central lines / invasive devices: ***  Code Status: ***  Current Admission Status: ***  TOC needs / Dispo plan: *** Barriers to discharge / significant pending items: ***

## 2023-04-06 NOTE — Assessment & Plan Note (Signed)
Ativan 1 mg IV every 6 hours as needed for anxiety, 4 doses ordered

## 2023-04-06 NOTE — Assessment & Plan Note (Addendum)
Etiology workup in progress at this time Complicated by recent vascular surgery and multiple sedating medication including Flexeril, pregabalin, trazodone, p.o. morphine 15 mg tablets Avoid excessive sedating medication on admission, however patient is very dependent on pain control medications Home Flexeril not resumed on admission, I discussed with patient and she is in agreement with not getting her Flexeril on day of admission CTA of the chest on day of admission was read as interstitial septal thickening with scattered patchy in distribution ill-defined groundglass bilaterally which is a change compared to CT of the chest with contrast in 10/20/2019 Check procalcitonin, if positive will initiate antibiotic coverage for commune acquired pneumonia/atypical pneumonia DuoNebs every 6 hours.  For wheezing and shortness of breath, 2 days ordered Status post Solu-Medrol 125 mg IV one-time dose per EDP Ordered Solu-Medrol 80 mg IV daily, one-time dose for 04/07/2023 Admit to telemetry cardiac, inpatient

## 2023-04-06 NOTE — ED Notes (Signed)
RN to bedside to administer medications. Pt becoming rude with staff and extremely demanding. Pt demanding for her medications. RN explained what medications are currently due and that the rest are due at 2200. Pt continues to interrupt RN when talking. Pt continues to ask about a TV remote as RN has explained we have done our best to find extras but have not. Pt then expresses more about how unhappy she is with her care. RN tried to explain that we are doing the best we can and apologized. Pt asking about when she will get a room upstairs. RN explained that we do not have a time for when that will be but she will be moved to the admit hold area of the ER. Pt states "what's the point of moving me if there is no bathroom in the room. Move me to a room with a bathroom. You guys dont have to use the patient restrooms so why should I? They are dirty. They should be cleaned after every person uses them." RN apologized and informed pt that we contacted house keeping to clean restroom and that we do the best we can but there is a lot of patients in the emergency department. Pt continues to be disrespectful and demanding. Pt states she wants more ginger ale and some cookies. RN explained to pt her blood sugar is high and she can't have extra of those things.

## 2023-04-06 NOTE — Assessment & Plan Note (Addendum)
On PDMP, patient has prescription for pregabalin 100 mg tablet, morphine 15 mg tablet, alprazolam 0.5 mg tablet Holding home Flexeril 10 mg tablet 3 times daily as needed for muscle spasms on admission Avoid further sedating medication, however patient is dependent on pain control medication Resumed home morphine immediate release 15 mg p.o. twice daily, alprazolam 0.5 mg every 6 hours as needed for anxiety, pregabalin 100 mg 3 times daily

## 2023-04-06 NOTE — ED Provider Notes (Signed)
[facility] Provider Note    Event Date/Time   First MD Initiated Contact with Patient 04/06/23 1032     (approximate)   History   Post-op Problem   HPI  Jessica Donovan is a 77 y.o. female who is status post 2 stents placed yesterday.  Patient comes in with swelling and pain around the surgical site as well as some shortness of breath.  Patient was 88% on room air and placed on 2 L of oxygen.  I reviewed the notes were patient was seen by Dr. 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  Patient had a mesenteric angiogram and had stent to the SMA.   Patient reports that she developed severe pain in her right leg and swelling in her upper thigh that started last night she states that she is been feeling very fatigued and short of breath associated with it.  She states that normally her oxygen runs in the low 90s but denies any history of lung problems or need to be on oxygen previously.  She states that she supposed to start Plavix but not been delivered yet from the pharmacy show she has not started taking it.  She denies any falls hitting her head    Physical Exam   Triage Vital Signs: ED Triage Vitals  Enc Vitals Group     BP 04/06/23 1024 116/65     Pulse Rate 04/06/23 1024 68     Resp 04/06/23 1024 18     Temp 04/06/23 1024 98 F (36.7 C)     Temp src --      SpO2 04/06/23 1024 91 %     Weight 04/06/23 1025 184 lb 15.5 oz (83.9 kg)     Height 04/06/23 1025  (1.575 m)     Head Circumference --      Peak Flow --      Pain Score 04/06/23 1025 8     Pain Loc --      Pain Edu? --      Excl. in GC? --     Most recent vital signs: Vitals:   04/06/23 1024  BP: 116/65  Pulse: 68  Resp: 18  Temp: 98 F (36.7 C)  SpO2: 91%     General: Awake, no distress.  CV:  Good peripheral perfusion.  Resp:  Normal effort.  Abd:  No distention.  Other:  Patient has dressing noted over the right femoral area.   She got good distal pulse.  No significant fullness in the thigh.   ED Results / Procedures / Treatments   Labs (all labs ordered are listed, but only abnormal results are displayed) Labs Reviewed  CBC WITH DIFFERENTIAL/PLATELET - Abnormal; Notable for the following components:      Result Value   MCV 101.2 (*)    All other components within normal limits  RESP PANEL BY RT-PCR (RSV, FLU A&B, COVID)  [id number]  COMPREHENSIVE METABOLIC PANEL  TROPONIN I (HIGH SENSITIVITY)  TROPONIN I (HIGH SENSITIVITY)     EKG  My interpretation of EKG:  Normal sinus rate of 66 that any ST elevation or T wave inversions, normal intervals  RADIOLOGY I have reviewed the xray personally and interpreted and no evidence of any pneumonia   PROCEDURES:  Critical Care performed: No  .Critical Care  Performed by: Concha Se, MD Authorized by: Concha Se, MD   Critical care provider statement:    Critical care  time (minutes):  30   Critical care was necessary to treat or prevent imminent or life-threatening deterioration of the following conditions:  Respiratory failure   Critical care was time spent personally by me on the following activities:  Development of treatment plan with patient or surrogate, discussions with consultants, evaluation of patient's response to treatment, examination of patient, ordering and review of laboratory studies, ordering and review of radiographic studies, ordering and performing treatments and interventions, pulse oximetry, re-evaluation of patient's condition and review of old charts    MEDICATIONS ORDERED IN ED: Medications  HYDROmorphone (DILAUDID) injection 0.5 mg (0.5 mg Intravenous Given 04/06/23 1125)  ondansetron (ZOFRAN) injection 4 mg (4 mg Intravenous Given 04/06/23 1125)     IMPRESSION / MDM / ASSESSMENT AND PLAN / ED COURSE  I reviewed the triage vital signs and the nursing notes.   Patient's presentation is most consistent with acute  presentation with potential threat to life or bodily function.   Patient comes in with concern for right leg pain as well as, hypoxia.  She states that she typically runs 9394% but was 88% in triage.  Workup was done to evaluate for PE, pseudoaneurysm, DVT.  Patient is good good distal pulse.  We did attempt to take patient off the oxygen but she desatted again to 88% therefore we will proceed with CT PE DVT ultrasound was negative.  CBC shows stable hemoglobin  COVID, flu are negative.  CBC reassuring CMP reassuring troponin is negative.  CT PE shows scattered bilateral areas of groundglass with some interstitial septal thickening nonspecific.  Patient denies any smoking history but patient is hypoxic on trial x 2 therefore will discuss with patient for admission.  Will cover with antibiotics and trial some steroids and discussed with the hospital team for admission.  I will also alert Dr. Wyn Quaker still pending the ultrasound  Discussed with Dr. Wyn Quaker and no intervention is needed unless there is a pseudoaneurysm.  On repeat evaluation she reports worsening pain we will give some oxycodone but I do not see any significantly worsening of the swelling.  Ultrasound is still pending of the arterial I tried to call ultrasound to help facilitate read but we are still waiting.  She has been hypoxic x 2 requiring 2 L will trial some steroids, antibiotics.  She denies any smoking history do not hear any wheezing but unclear what is causing this inflammation of the lungs.  Discussed with the hospital team for admission  The patient is on the cardiac monitor to evaluate for evidence of arrhythmia and/or significant heart rate changes.      FINAL CLINICAL IMPRESSION(S) / ED DIAGNOSES   Final diagnoses:  Acute respiratory failure with hypoxia  Post-op pain     Rx / DC Orders   ED Discharge Orders     None        Note:  This document was prepared using Dragon voice recognition software and may include  unintentional dictation errors.   Concha Se, MD 04/06/23 [phone]

## 2023-04-06 NOTE — Assessment & Plan Note (Addendum)
Status post DES PCI from mid circumflex distal circumflex in 2014 Left heart cath in 2016 showed patent DES and insignificant CAD

## 2023-04-06 NOTE — ED Notes (Signed)
Patient transported to X-ray 

## 2023-04-06 NOTE — Assessment & Plan Note (Signed)
Resumed aspirin 81 mg daily Patient is prescribed Plavix and was told to start Plavix on 04/05/2023, patient has not been able to start due to delay from pharmacy and due to her pain Plavix 75 mg daily started

## 2023-04-06 NOTE — ED Notes (Signed)
Ultrasound at bedside

## 2023-04-06 NOTE — ED Triage Notes (Signed)
Pt arrived via EMS for complaint of post op problem. Pt states she had 2 stents placed yesterday. Pt states she noticed swelling last night and woke up this morning in a lot of pain at surgical site. Pt endorses SOB but states she believes it is because of the pain, pt denies CP. Pt states she does not wear oxygen at home but her o2 saturation on assessment is 88%. Pt placed on 2LNC. Pt A&Ox4. Pt also reports she has not yet received the medications that were prescribed from pharmacy delivery so she has not taken any.

## 2023-04-07 ENCOUNTER — Encounter: Payer: Self-pay | Admitting: Internal Medicine

## 2023-04-07 DIAGNOSIS — J9601 Acute respiratory failure with hypoxia: Secondary | ICD-10-CM | POA: Diagnosis not present

## 2023-04-07 LAB — CBC
HCT: 39.3 % (ref 36.0–46.0)
Hemoglobin: 12.6 g/dL (ref 12.0–15.0)
MCH: 31.4 pg (ref 26.0–34.0)
MCHC: 32.1 g/dL (ref 30.0–36.0)
MCV: 98 fL (ref 80.0–100.0)
Platelets: 296 10*3/uL (ref 150–400)
RBC: 4.01 MIL/uL (ref 3.87–5.11)
RDW: 12.2 % (ref 11.5–15.5)
WBC: 7.5 10*3/uL (ref 4.0–10.5)
nRBC: 0 % (ref 0.0–0.2)

## 2023-04-07 LAB — BASIC METABOLIC PANEL
Anion gap: 8 (ref 5–15)
BUN: 18 mg/dL (ref 8–23)
CO2: 25 mmol/L (ref 22–32)
Calcium: 9.5 mg/dL (ref 8.9–10.3)
Chloride: 101 mmol/L (ref 98–111)
Creatinine, Ser: 0.78 mg/dL (ref 0.44–1.00)
GFR, Estimated: >60 mL/min (ref >60)
Glucose, Bld: 213 mg/dL — ABNORMAL HIGH (ref 70–99)
Potassium: 4.3 mmol/L (ref 3.5–5.1)
Sodium: 134 mmol/L — ABNORMAL LOW (ref 135–145)

## 2023-04-07 LAB — CBG MONITORING, ED: Glucose-Capillary: 227 mg/dL — ABNORMAL HIGH (ref 70–99)

## 2023-04-07 LAB — HEMOGLOBIN A1C
Hgb A1c MFr Bld: 6.7 % — ABNORMAL HIGH (ref 4.8–5.6)
Mean Plasma Glucose: 145.59 mg/dL

## 2023-04-07 MED ORDER — TRAZODONE HCL 100 MG PO TABS: 50.0000 mg | ORAL_TABLET | Freq: Every evening | ORAL | Status: DC | PRN

## 2023-04-07 NOTE — ED Notes (Signed)
MD at bedside assessing pt at this time.

## 2023-04-07 NOTE — ED Notes (Signed)
AVS provided to and discussed with patient. Pt verbalizes understanding of discharge instructions and denies any questions or concerns at this time. Pt has ride home. Pt taken to lobby to wait for her ride. Pt is upset and verbally abusive to staff, stating, "This is completely ridiculous to send me out to the lobby in my pajamas to wait for AN HOUR. Those chairs are uncomfortable. All you care about is making a dollar." Pt is A&O X 4, able to ambulate short distances.

## 2023-04-07 NOTE — ED Notes (Signed)
Pt ambulated to BR using walker. Tolerated well.

## 2023-04-07 NOTE — Care Management CC44 (Signed)
Condition Code 44 Documentation Completed  Patient Details  Name: Jessica Donovan MRN: [id number] Date of Birth: [birth year]   Condition Code 44 given:  Yes Patient signature on Condition Code 44 notice:  Yes Documentation of 2 MD's agreement:  Yes Code 44 added to claim:  Yes    Darolyn Rua, LCSW 04/07/2023, 12:09 PM

## 2023-04-07 NOTE — Discharge Summary (Signed)
Physician Discharge Summary   Patient: Jessica Donovan MRN: [id number]  DOB: [birth year]   Admit:     Date of Admission: 04/06/2023 Admitted from: home   Discharge: Date of discharge: 04/07/23 Disposition: Home Condition at discharge: good  CODE STATUS: FULL CODE     Discharge Physician: Jessica Nielsen, DO [facility]     PCP: Jessica Sell, MD  Recommendations for Outpatient Follow-up:  Follow up with PCP Jessica Sell, MD in 1-2 weeks  Please obtain labs/tests: consdier CBC, BMP as needed. Please follow up on the following pending results: n/a Please follow as directed w/ vascular surgery  Please assess polypharmacy risk: have HELD Flexeril, REDUCED Trazodone.  PCP AND OTHER OUTPATIENT PROVIDERS: SEE BELOW FOR SPECIFIC DISCHARGE INSTRUCTIONS PRINTED FOR PATIENT IN ADDITION TO GENERIC AVS PATIENT INFO    Discharge Instructions     Call MD for:  redness, tenderness, or signs of infection (pain, swelling, redness, odor or green/yellow discharge around incision site)   Complete by: As directed    Diet - low sodium heart healthy   Complete by: As directed    Discharge instructions   Complete by: As directed    Please be sure to pick up the Plavix medication at your pharmacy and take this medicine until or unless told otherwise by Dr Jessica Donovan or one of his partners!   Increase activity slowly   Complete by: As directed          Discharge Diagnoses: Principal Problem:   Acute hypoxemic respiratory failure Active Problems:   Anxiety   Polypharmacy   CAD S/P percutaneous coronary angioplasty   Chronic narcotic use   Chronic pain syndrome   Diabetic polyneuropathy   Generalized anxiety disorder   Type 2 diabetes mellitus with diabetic neuropathy   HTN (hypertension)   S/P cardiac catheterization   At risk for polypharmacy   Leg pain, diffuse, right   PAD (peripheral artery disease)       Hospital Course: 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 04/16: 88% on room air --> SpO2 of 91% on 2 L nasal cannula. CTA chest was read as interstitial septal thickening with scattered patchy in distribution ill-defined groundglass bilaterally which is a change compared to CT of the chest with contrast in 10/20/2019. ED treatment: Azithromycin 500 mg p.o. one-time dose, Dilaudid 0.5 mg IV one-time dose, Solu-Medrol 125 mg IV one-time dose, ondansetron 4 mg IV one-time dose, ceftriaxone 1 g IV one-time dose. Admitted to hospitalist service. Right external artery ultrasound and venous ultrasound of the right leg were negative.  04/17: SpO2 100% RA. Her pain to RLE is much much better this morning.  No further workup is needed per vascular surgery team, they state it is okay to discharge today from their standpoint.  She was instructed to make sure she takes her aspirin and her Plavix daily as ordered  Consultants:  Vascular Surgery   Procedures: none      ASSESSMENT & PLAN:  Acute hypoxemic respiratory failure - resolved Complicated by recent vascular surgery and multiple sedating medication including Flexeril, pregabalin, trazodone, p.o. morphine 15  mg tablets Avoid excessive sedating medication on admission, however patient is very dependent on pain control medications Hold home Flexeril Procalcitonin <0.10, holding abx DuoNebs every 6 hours prn Solu-Medrol 80 mg IV daily, one-time dose for 04/07/2023   Anxiety Ativan 1 mg IV every 6 hours as needed for anxiety, 4 doses  ordered   PAD (peripheral artery disease) Patient is prescribed Plavix and was told to start Plavix on 04/05/2023, patient has not been able to start, she reports due to delay from pharmacy and due to her pain Resumed aspirin 81 mg daily Plavix 75 mg daily started Per vascular surgery, no complication from procedure, ok to discharge today from their standpoint and follow outpatient    Leg pain, diffuse, right - improved  Patient is status post SMA stent placement on 04/05/2023 Patient may benefit from PT, OT evaluation outpatient   At risk for polypharmacy On PDMP, patient has prescription for pregabalin 100 mg tablet, morphine 15 mg tablet, alprazolam 0.5 mg tablet Holding home Flexeril 10 mg tablet 3 times daily as needed for muscle spasms on admission Reducing home Trazodone Holding home phenergan  Avoid further sedating medication as able but recognize that this patient is dependent on pain control medication Resumed home morphine immediate release 15 mg p.o. twice daily, alprazolam 0.5 mg every 6 hours as needed for anxiety, pregabalin 100 mg 3 times daily   Type 2 diabetes mellitus with diabetic neuropathy Insulin SSI with at bedtime coverage ordered Goal inpatient blood glucose levels 140-180   Chronic pain syndrome Resumed morphine 15 mg p.o. twice daily BID Resumed home Lyrica 100 mg p.o. 3 times daily Holding home Flexeril on admission due to acute hypoxic respiratory failure   CAD S/P percutaneous coronary angioplasty Status post DES PCI from mid circumflex distal circumflex in 2014 Left heart cath in 2016 showed patent DES and insignificant CAD            Discharge Instructions  Allergies as of 04/07/2023       Reactions   Amoxicillin Diarrhea   Ampicillin Nausea And Vomiting, Other (See Comments)   Did it involve swelling of the face/tongue/throat, SOB, or low BP? Yes Did it involve sudden or severe rash/hives, skin peeling, or any reaction on the inside  of your mouth or nose? No Did you need to seek medical attention at a hospital or doctor's office? No When did it last happen? >10 years If all above answers are "NO", may proceed with cephalosporin use.   Augmentin [amoxicillin-pot Clavulanate] Diarrhea   Bactrim [sulfamethoxazole-trimethoprim] Other (See Comments)   N/V/D   Codeine Nausea And Vomiting   Egg-derived Products    ALLergic to cook or raw egg only. Food with egg ingredient is okay for patient.    Influenza Vaccines Other (See Comments)   Reaction:  Caused pt to pass out    Influenza Virus Vaccine Other (See Comments)   Reaction: Passed out for 12 days   Methadone Hives, Itching   Oxycodone-acetaminophen Nausea And Vomiting   Percocet [oxycodone-acetaminophen] Nausea And Vomiting   Sulfa Antibiotics Other (See Comments)   Reaction: unknown   Tetanus Antitoxin Swelling, Other (See Comments)   Reaction: injection site swelling   Tetanus Toxoids Swelling, Other (See Comments)   Reaction:  Swelling at injection site   Penicillin G Rash   Has patient had a PCN reaction causing immediate rash, facial/tongue/throat swelling, SOB or lightheadedness with hypotension: Yes Has patient had a PCN reaction causing severe rash involving mucus membranes  or skin necrosis: No Has patient had a PCN reaction that required hospitalization: No Has patient had a PCN reaction occurring within the last 10 years: No If all of the above answers are "NO", then may proceed with Cephalosporin use..   Tetracyclines & Related Rash        Medication List     STOP taking these medications    cyclobenzaprine 10 MG tablet Commonly known as: FLEXERIL   levofloxacin 500 MG tablet Commonly known as: LEVAQUIN   promethazine 25 MG tablet Commonly known as: PHENERGAN       TAKE these medications    Accu-Chek Guide test strip Generic drug: glucose blood TEST 3 TIMES DAILY AS DIRECTED   acetaminophen 500 MG tablet Commonly known as:  TYLENOL Take 1,000 mg by mouth every 8 (eight) hours as needed for mild pain or headache.   ALPRAZolam 0.5 MG tablet Commonly known as: XANAX Take 0.5 mg by mouth 4 (four) times daily as needed for anxiety.   AMBULATORY NON FORMULARY MEDICATION Compounded estrogen  Apply 1 gram vaginally M,W, Friday [facility]   aspirin 81 MG tablet Take 81 mg by mouth daily.   atorvastatin 20 MG tablet Commonly known as: LIPITOR Take 20 mg by mouth daily.   celecoxib 200 MG capsule Commonly known as: CELEBREX Take 200 mg by mouth daily.   clopidogrel 75 MG tablet Commonly known as: Plavix Take 1 tablet (75 mg total) by mouth daily.   HAIR SKIN AND NAILS FORMULA PO Take by mouth daily. 2 gummies daily   insulin aspart 100 UNIT/ML injection Commonly known as: novoLOG Inject 3-15 Units into the skin 3 (three) times daily with meals as needed for high blood sugar. Pt uses as needed per sliding scale:    Less than 140:  0 units  140-180:  3 units 181-220:  4 units 221- 260:  6 units 261- 320:  8 units 321-360:  10 units 361-400:  12 units Greater than 400:  15 units   isosorbide mononitrate 30 MG 24 hr tablet Commonly known as: IMDUR Take 30 mg by mouth daily.   lisinopril 20 MG tablet Commonly known as: ZESTRIL Take 20 mg by mouth daily.   metoprolol tartrate 25 MG tablet Commonly known as: LOPRESSOR Take 1 tablet (25 mg total) by mouth 2 (two) times daily.   mirtazapine 15 MG tablet Commonly known as: REMERON Take 15 mg by mouth at bedtime.   morphine 15 MG tablet Commonly known as: MSIR Take 15 mg by mouth 2 (two) times daily.   omeprazole 40 MG capsule Commonly known as: PRILOSEC Take 1 capsule (40 mg total) by mouth daily.   ONE TOUCH ULTRA 2 w/Device Kit SMARTSIG:1 Via Meter Daily   OneTouch Delica Plus Lancet33G Misc Apply topically.   polyvinyl alcohol 1.4 % ophthalmic solution Commonly known as: LIQUIFILM TEARS Place 1 drop into the right eye 4  (four) times daily.   pregabalin 100 MG capsule Commonly known as: LYRICA Take 100 mg by mouth 3 (three) times daily.   Toujeo SoloStar 300 UNIT/ML Solostar Pen Generic drug: insulin glargine (1 Unit Dial) Inject 60 Units into the skin every morning.   traZODone 100 MG tablet Commonly known as: DESYREL Take 0.5 tablets (50 mg total) by mouth at bedtime as needed for sleep. What changed:  how much to take when to take this reasons to take this   venlafaxine XR 75 MG 24 hr capsule Commonly known as: EFFEXOR-XR Take 75 mg  by mouth 2 (two) times daily.   Vitamin D (Ergocalciferol) 1.25 MG (50000 UNIT) Caps capsule Commonly known as: DRISDOL Take 50,000 Units by mouth every Sunday.          Allergies  Allergen Reactions   Amoxicillin Diarrhea   Ampicillin Nausea And Vomiting and Other (See Comments)    Did it involve swelling of the face/tongue/throat, SOB, or low BP? Yes Did it involve sudden or severe rash/hives, skin peeling, or any reaction on the inside of your mouth or nose? No Did you need to seek medical attention at a hospital or doctor's office? No When did it last happen? >10 years If all above answers are "NO", may proceed with cephalosporin use.    Augmentin [Amoxicillin-Pot Clavulanate] Diarrhea   Bactrim [Sulfamethoxazole-Trimethoprim] Other (See Comments)    N/V/D   Codeine Nausea And Vomiting   Egg-Derived Products     ALLergic to cook or raw egg only. Food with egg ingredient is okay for patient.    Influenza Vaccines Other (See Comments)    Reaction:  Caused pt to pass out    Influenza Virus Vaccine Other (See Comments)    Reaction: Passed out for 12 days   Methadone Hives and Itching   Oxycodone-Acetaminophen Nausea And Vomiting   Percocet [Oxycodone-Acetaminophen] Nausea And Vomiting   Sulfa Antibiotics Other (See Comments)    Reaction: unknown   Tetanus Antitoxin Swelling and Other (See Comments)    Reaction: injection site swelling    Tetanus Toxoids Swelling and Other (See Comments)    Reaction:  Swelling at injection site   Penicillin G Rash    Has patient had a PCN reaction causing immediate rash, facial/tongue/throat swelling, SOB or lightheadedness with hypotension: Yes Has patient had a PCN reaction causing severe rash involving mucus membranes or skin necrosis: No Has patient had a PCN reaction that required hospitalization: No Has patient had a PCN reaction occurring within the last 10 years: No If all of the above answers are "NO", then may proceed with Cephalosporin use..   Tetracyclines & Related Rash     Subjective: pt reprots pain is much better today, no shortness of breath, she is ambulating independently and has no objections or concerns to discharge home    Discharge Exam: BP (!) 159/68 (BP Location: Right Arm)   Pulse (!) 106   Temp 97.8 F (36.6 C) (Oral)   Resp 16   Ht 5\' 2"  (1.575 m)   Wt 83.9 kg   SpO2 100%   BMI 33.83 kg/m  General: Pt is alert, awake, not in acute distress Cardiovascular: RRR, S1/S2 +, no rubs, no gallops Respiratory: CTA bilaterally, no wheezing, no rhonchi Abdominal: Soft, NT, ND, bowel sounds + Extremities: no edema, no cyanosis     The results of significant diagnostics from this hospitalization (including imaging, microbiology, ancillary and laboratory) are listed below for reference.     Microbiology: Recent Results (from the past 240 hour(s))  Resp panel by RT-PCR (RSV, Flu A&B, Covid) Anterior Nasal Swab     Status: None   Collection Time: 04/06/23 11:26 AM   Specimen: Anterior Nasal Swab  Result Value Ref Range Status   SARS Coronavirus 2 by RT PCR NEGATIVE NEGATIVE Final    Comment: (NOTE) SARS-CoV-2 target nucleic acids are NOT DETECTED.  The SARS-CoV-2 RNA is generally detectable in upper respiratory specimens during the acute phase of infection. The lowest concentration of SARS-CoV-2 viral copies this assay can detect is 138 copies/mL. A  negative result does not preclude SARS-Cov-2 infection and should not be used as the sole basis for treatment or other patient management decisions. A negative result may occur with  improper specimen collection/handling, submission of specimen other than nasopharyngeal swab, presence of viral mutation(s) within the areas targeted by this assay, and inadequate number of viral copies(<138 copies/mL). A negative result must be combined with clinical observations, patient history, and epidemiological information. The expected result is Negative.  Fact Sheet for Patients:  BloggerCourse.com  Fact Sheet for Healthcare Providers:  SeriousBroker.it  This test is no t yet approved or cleared by the [place] FDA and  has been authorized for detection and/or diagnosis of SARS-CoV-2 by FDA under an Emergency Use Authorization (EUA). This EUA will remain  in effect (meaning this test can be used) for the duration of the COVID-19 declaration under Section 564(b)(1) of the Act, 21 U.S.C.section 360bbb-3(b)(1), unless the authorization is terminated  or revoked sooner.       Influenza A by PCR NEGATIVE NEGATIVE Final   Influenza B by PCR NEGATIVE NEGATIVE Final    Comment: (NOTE) The Xpert Xpress SARS-CoV-2/FLU/RSV plus assay is intended as an aid in the diagnosis of influenza from Nasopharyngeal swab specimens and should not be used as a sole basis for treatment. Nasal washings and aspirates are unacceptable for Xpert Xpress SARS-CoV-2/FLU/RSV testing.  Fact Sheet for Patients: BloggerCourse.com  Fact Sheet for Healthcare Providers: SeriousBroker.it  This test is not yet approved or cleared by the [place] FDA and has been authorized for detection and/or diagnosis of SARS-CoV-2 by FDA under an Emergency Use Authorization (EUA). This EUA will remain in effect (meaning this test can  be used) for the duration of the COVID-19 declaration under Section 564(b)(1) of the Act, 21 U.S.C. section 360bbb-3(b)(1), unless the authorization is terminated or revoked.     Resp Syncytial Virus by PCR NEGATIVE NEGATIVE Final    Comment: (NOTE) Fact Sheet for Patients: BloggerCourse.com  Fact Sheet for Healthcare Providers: SeriousBroker.it  This test is not yet approved or cleared by the [place] FDA and has been authorized for detection and/or diagnosis of SARS-CoV-2 by FDA under an Emergency Use Authorization (EUA). This EUA will remain in effect (meaning this test can be used) for the duration of the COVID-19 declaration under Section 564(b)(1) of the Act, 21 U.S.C. section 360bbb-3(b)(1), unless the authorization is terminated or revoked.  Performed at [facility], [address] [place], [place] [address]      Labs: BNP (last 3 results) No results for input(s): "BNP" in the last 8760 hours. Basic Metabolic Panel: Recent Labs  Lab 04/05/23 1028 04/06/23 1126 04/07/23 0456  NA  --  138 134*  K  --  4.2 4.3  CL  --  106 101  CO2  --  23 25  GLUCOSE  --  143* 213*  BUN CREATININE 0.89 0.75 0.78  CALCIUM  --  9.0 9.5   Liver Function Tests: Recent Labs  Lab 04/06/23 1126  AST 24  ALT 9  ALKPHOS 67  BILITOT 0.5  PROT 6.2*  ALBUMIN 3.7   No results for input(s): "LIPASE", "AMYLASE" in the last 168 hours. No results for input(s): "AMMONIA" in the last 168 hours. CBC: Recent Labs  Lab 04/06/23 1126 04/07/23 0456  WBC 7.1 7.5  NEUTROABS 4.0  --   HGB 13.0 12.6  HCT 41.7 39.3  MCV 101.2* 98.0  PLT 299 296   Cardiac  Enzymes: No results for input(s): "CKTOTAL", "CKMB", "CKMBINDEX", "TROPONINI" in the last 168 hours. BNP: Invalid input(s): "POCBNP" CBG: Recent Labs  Lab 04/05/23 1027 04/05/23 1146 04/06/23 1833 04/06/23 2340 04/07/23 0842  GLUCAP 59* 135* 203*  250* 227*   D-Dimer No results for input(s): "DDIMER" in the last 72 hours. Hgb A1c Recent Labs    04/07/23 0456  HGBA1C 6.7*   Lipid Profile No results for input(s): "CHOL", "HDL", "LDLCALC", "TRIG", "CHOLHDL", "LDLDIRECT" in the last 72 hours. Thyroid function studies No results for input(s): "TSH", "T4TOTAL", "T3FREE", "THYROIDAB" in the last 72 hours.  Invalid input(s): "FREET3" Anemia work up No results for input(s): "VITAMINB12", "FOLATE", "FERRITIN", "TIBC", "IRON", "RETICCTPCT" in the last 72 hours. Urinalysis    Component Value Date/Time   COLORURINE YELLOW (A) 11/18/2022 0630   APPEARANCEUR CLEAR (A) 11/18/2022 0630   APPEARANCEUR Cloudy (A) 06/03/2020 1559   LABSPEC 1.009 11/18/2022 0630   LABSPEC 1.014 12/15/2014 0958   PHURINE 5.0 11/18/2022 0630   GLUCOSEU NEGATIVE 11/18/2022 0630   GLUCOSEU Negative 12/15/2014 0958   HGBUR NEGATIVE 11/18/2022 0630   BILIRUBINUR NEGATIVE 11/18/2022 0630   BILIRUBINUR Negative 06/03/2020 1559   BILIRUBINUR Negative 12/15/2014 0958   KETONESUR NEGATIVE 11/18/2022 0630   PROTEINUR NEGATIVE 11/18/2022 0630   NITRITE NEGATIVE 11/18/2022 0630   LEUKOCYTESUR NEGATIVE 11/18/2022 0630   LEUKOCYTESUR 1+ 12/15/2014 0958   Sepsis Labs Recent Labs  Lab 04/06/23 1126 04/07/23 0456  WBC 7.1 7.5   Microbiology Recent Results (from the past 240 hour(s))  Resp panel by RT-PCR (RSV, Flu A&B, Covid) Anterior Nasal Swab     Status: None   Collection Time: 04/06/23 11:26 AM   Specimen: Anterior Nasal Swab  Result Value Ref Range Status   SARS Coronavirus 2 by RT PCR NEGATIVE NEGATIVE Final    Comment: (NOTE) SARS-CoV-2 target nucleic acids are NOT DETECTED.  The SARS-CoV-2 RNA is generally detectable in upper respiratory specimens during the acute phase of infection. The lowest concentration of SARS-CoV-2 viral copies this assay can detect is 138 copies/mL. A negative result does not preclude SARS-Cov-2 infection and should not be  used as the sole basis for treatment or other patient management decisions. A negative result may occur with  improper specimen collection/handling, submission of specimen other than nasopharyngeal swab, presence of viral mutation(s) within the areas targeted by this assay, and inadequate number of viral copies(<138 copies/mL). A negative result must be combined with clinical observations, patient history, and epidemiological information. The expected result is Negative.  Fact Sheet for Patients:  BloggerCourse.com  Fact Sheet for Healthcare Providers:  SeriousBroker.it  This test is no t yet approved or cleared by the [place] FDA and  has been authorized for detection and/or diagnosis of SARS-CoV-2 by FDA under an Emergency Use Authorization (EUA). This EUA will remain  in effect (meaning this test can be used) for the duration of the COVID-19 declaration under Section 564(b)(1) of the Act, 21 U.S.C.section 360bbb-3(b)(1), unless the authorization is terminated  or revoked sooner.       Influenza A by PCR NEGATIVE NEGATIVE Final   Influenza B by PCR NEGATIVE NEGATIVE Final    Comment: (NOTE) The Xpert Xpress SARS-CoV-2/FLU/RSV plus assay is intended as an aid in the diagnosis of influenza from Nasopharyngeal swab specimens and should not be used as a sole basis for treatment. Nasal washings and aspirates are unacceptable for Xpert Xpress SARS-CoV-2/FLU/RSV testing.  Fact Sheet for Patients: BloggerCourse.com  Fact Sheet for Healthcare Providers: SeriousBroker.it  This test is not yet approved or cleared by the [place] and has been authorized for detection and/or diagnosis of SARS-CoV-2 by FDA under an Emergency Use Authorization (EUA). This EUA will remain in effect (meaning this test can be used) for the duration of the COVID-19 declaration under Section  564(b)(1) of the Act, 21 U.S.C. section 360bbb-3(b)(1), unless the authorization is terminated or revoked.     Resp Syncytial Virus by PCR NEGATIVE NEGATIVE Final    Comment: (NOTE) Fact Sheet for Patients: BloggerCourse.com  Fact Sheet for Healthcare Providers: SeriousBroker.it  This test is not yet approved or cleared by the [place] FDA and has been authorized for detection and/or diagnosis of SARS-CoV-2 by FDA under an Emergency Use Authorization (EUA). This EUA will remain in effect (meaning this test can be used) for the duration of the COVID-19 declaration under Section 564(b)(1) of the Act, 21 U.S.C. section 360bbb-3(b)(1), unless the authorization is terminated or revoked.  Performed at [facility], [address] [place], [place] [address]    Imaging [place] Lower Ext Art Right Ltd  Result Date: 04/06/2023 CLINICAL DATA:  Leg pain and swelling, post femoral catheterization EXAM: RIGHT LOWER EXTREMITY ARTERIAL DUPLEX SCAN (limited) TECHNIQUE: Gray-scale sonography as well as color Doppler and duplex ultrasound was performed to evaluate the lower extremity arteries in the right groin area of concern. COMPARISON:  None Available. FINDINGS: Right lower Extremity Right groin area of concern interrogated with gray and color Doppler imaging. Right common femoral artery and vein appear patent. No superficial soft tissue abnormality, hemorrhage, hematoma, vascular abnormality, or pseudoaneurysm appreciated. Common femoral vein is also patent and compressible. IMPRESSION: Negative right groin ultrasound. Electronically Signed   By: Judie Petit.  Shick M.D.   On: 04/06/2023 14:06   CT Angio Chest PE W and/or Wo Contrast  Result Date: 04/06/2023 CLINICAL DATA:  Postop.  Leg swelling and pain. EXAM: CT ANGIOGRAPHY CHEST WITH CONTRAST TECHNIQUE: Multidetector CT imaging of the chest was performed using the standard protocol during bolus  administration of intravenous contrast. Multiplanar CT image reconstructions and MIPs were obtained to evaluate the vascular anatomy. RADIATION DOSE REDUCTION: This exam was performed according to the departmental dose-optimization program which includes automated exposure control, adjustment of the mA and/or kV according to patient size and/or use of iterative reconstruction technique. CONTRAST:  75mL OMNIPAQUE IOHEXOL 350 MG/ML SOLN COMPARISON:  Chest x-ray earlier 04/06/2023.  Chest CT 10/20/2019. FINDINGS: Cardiovascular: No pulmonary embolism identified. Heart is nonenlarged. Trace pericardial effusion. The thoracic aorta has a normal course and caliber with some vascular calcifications. Coronary artery calcifications are noted. Mediastinum/Nodes: Normal caliber thoracic esophagus. Preserved thyroid gland. No specific abnormal lymph node enlargement identified in the axillary regions, hilum or mediastinum Lungs/Pleura: Breathing motion seen. There are areas of interstitial septal thickening with scattered patchy in distribution ill-defined ground-[place] bilaterally. No pneumothorax, effusion. No consolidation. Upper Abdomen: What is seen of the adrenal glands are preserved. These incompletely included in the imaging field. Musculoskeletal: Moderate degenerative changes seen along the spine. Degenerative changes seen of the shoulders. Review of the MIP images confirms the above findings. IMPRESSION: No pulmonary embolism identified. Patchy scattered bilateral areas of ground-[place] with some interstitial septal thickening. Nonspecific. No consolidation. Recommend follow-up. Aortic Atherosclerosis (ICD10-I70.0). Electronically Signed   By: Karen Kays M.D.   On: 04/06/2023 13:18   US Venous Img Lower Unilateral Right  Result Date: 04/06/2023 CLINICAL DATA:  Leg pain and swelling EXAM: Right LOWER EXTREMITY VENOUS DOPPLER ULTRASOUND TECHNIQUE: Gray-scale  sonography with compression, as well as color and duplex  ultrasound, were performed to evaluate the deep venous system(s) from the level of the common femoral vein through the popliteal and proximal calf veins. COMPARISON:  None Available. FINDINGS: VENOUS Normal compressibility of the common femoral, superficial femoral, and popliteal veins, as well as the visualized calf veins. Visualized portions of profunda femoral vein and great saphenous vein unremarkable. No filling defects to suggest DVT on grayscale or color Doppler imaging. Doppler waveforms show normal direction of venous flow, normal respiratory plasticity and response to augmentation. Limited views of the contralateral common femoral vein are unremarkable. OTHER None. Limitations: none IMPRESSION: No evidence of right lower extremity DVT. Electronically Signed   By: Allegra Lai M.D.   On: 04/06/2023 12:23   DG Chest 2 View  Result Date: 04/06/2023 CLINICAL DATA:  Shortness of breath. EXAM: CHEST - 2 VIEW COMPARISON:  11/14/2020. FINDINGS: Low lung volumes accentuate the pulmonary vasculature and cardiomediastinal silhouette. No focal airspace opacity. No pleural effusion or pneumothorax. IMPRESSION: No evidence of acute cardiopulmonary disease. Electronically Signed   By: Orvan Falconer M.D.   On: 04/06/2023 11:41      Time coordinating discharge: over 30 minutes  SIGNED:  Sunnie Nielsen DO [facility]

## 2023-04-07 NOTE — Care Management Obs Status (Signed)
MEDICARE OBSERVATION STATUS NOTIFICATION   Patient Details  Name: Jessica Donovan MRN: [id number] Date of Birth: [birth year]   Medicare Observation Status Notification Given:  Yes    Darolyn Rua, LCSW 04/07/2023, 12:08 PM

## 2023-04-07 NOTE — TOC Transition Note (Signed)
Transition of Care [facility]) - CM/SW Discharge Note   Patient Details  Name: Jessica Donovan MRN: [id number] Date of Birth: 1946-03-31  Transition of Care [facility]) CM/SW Contact:  Darolyn Rua, LCSW Phone Number: 04/07/2023, 12:11 PM   Clinical Narrative:     Patient to discharge home today, reports using rollator at baseline no dme needs. States her friend Lynnell Dike can arrive in 15 minutes once she's told she's ready to dc, RN made aware pending medication.   Patient reports she has [facility] Tuesday, Thursdays and Fridays from 2pm-5pm,  uses [facility]. No additional dc planning needs per patient.   Final next level of care: Home w Home Health Services Barriers to Discharge: No Barriers Identified   Patient Goals and CMS Choice CMS Medicare.gov Compare Post Acute Care list provided to:: Patient Choice offered to / list presented to : Patient  Discharge Placement                         Discharge Plan and Services Additional resources added to the After Visit Summary for                                       Social Determinants of Health (SDOH) Interventions SDOH Screenings   Food Insecurity: No Food Insecurity (04/07/2023)  Housing: Low Risk  (04/07/2023)  Transportation Needs: No Transportation Needs (04/07/2023)  Utilities: Not At Risk (04/07/2023)  Tobacco Use: Low Risk  (04/07/2023)     Readmission Risk Interventions    06/13/2022   10:24 AM  Readmission Risk Prevention Plan  Post Dischage Appt Complete  Medication Screening Complete  Transportation Screening Complete

## 2023-04-07 NOTE — ED Notes (Signed)
Dressing to R groin changed. Wound about the size of a pencil eraser noted. No drainage or redness around site. Nonadherent pad and tape placed over wound for patient comfort.

## 2023-04-07 NOTE — Progress Notes (Signed)
Progress Note    04/07/2023 8:42 AM * No surgery found *  Subjective:  [name] is a 77 year old female with multiple medical diagnoses and is now postop day #1 from superior mesenteric artery stent placement.  She presented to the [facility] emergency department with some shortness of breath and increased pain in the right lower extremity.  She states that it was swollen and hurt on the anterior side of her right thigh where the insertion site for her angiogram for her superior mesenteric artery.  This morning she is resting comfortably in the bed in the emergency department.  She states that she has ambulated a couple of times with her walker.  She ambulated once down the hall in order to use the bathroom.  She states that the pain to her right lower extremity is much much better this morning.   Vitals:   04/07/23 0031 04/07/23 0732  BP: (!) 136/58 (!) 159/68  Pulse: (!) 102 (!) 106  Resp: 16 16  Temp: 98.2 F (36.8 C) 97.8 F (36.6 C)  SpO2: 94% 100%   Physical Exam: Cardiac:  no chest pain, no dyspnea,  no edema, no palpitations  Lungs:  Normal Respiratory effort, no cough, no sputum, no wheezing  Incisions:  Right groin puncture site, dressing clean dry and intact. No hematoma or seroma to note.  Extremities:  All extremities with palpable pulses.  Abdomen:  Positive bowel sounds, no nausea, no vomiting, no diarrhea, no constipation  Neurologic:  + weakness, no loss of consciousness, no syncope   CBC    Component Value Date/Time   WBC 7.5 04/07/2023 0456   RBC 4.01 04/07/2023 0456   HGB 12.6 04/07/2023 0456   HGB 11.8 (L) 12/15/2014 0958   HCT 39.3 04/07/2023 0456   HCT 37.0 12/15/2014 0958   PLT 296 04/07/2023 0456   PLT 269 12/15/2014 0958   MCV 98.0 04/07/2023 0456   MCV 91 12/15/2014 0958   MCH 31.4 04/07/2023 0456   MCHC 32.1 04/07/2023 0456   RDW 12.2 04/07/2023 0456   RDW 17.0 (H) 12/15/2014 0958   LYMPHSABS 2.2 04/06/2023 1126   MONOABS 0.6 04/06/2023  1126   EOSABS 0.1 04/06/2023 1126   BASOSABS 0.1 04/06/2023 1126    BMET    Component Value Date/Time   NA 134 (L) 04/07/2023 0456   NA 142 12/15/2014 0958   K 4.3 04/07/2023 0456   K 4.0 12/15/2014 0958   CL 101 04/07/2023 0456   CL 109 (H) 12/15/2014 0958   CO2 25 04/07/2023 0456   CO2 26 12/15/2014 0958   GLUCOSE 213 (H) 04/07/2023 0456   GLUCOSE 181 (H) 12/15/2014 0958   BUN 18 04/07/2023 0456   BUN 19 (H) 12/15/2014 0958   CREATININE 0.78 04/07/2023 0456   CREATININE 0.67 12/15/2014 0958   CALCIUM 9.5 04/07/2023 0456   CALCIUM 8.4 (L) 12/15/2014 0958   GFRNONAA >60 04/07/2023 0456   GFRNONAA >60 12/15/2014 0958   GFRAA >60 03/27/2020 0126   GFRAA >60 12/15/2014 0958    INR    Component Value Date/Time   INR 1.0 11/17/2022 1521    No intake or output data in the 24 hours ending 04/07/23 0842   Assessment/Plan:  77 y.o. female is s/p superior mesenteric artery angiogram with stent placement* No surgery found *   PLAN: Patient requested to see vascular surgery on return postoperatively from a superior mesenteric artery angiogram with stent placement.  Her complaint was pain to the  incision site and her thigh around that area.  This morning she states that the pain is much much better and she has ambulated throughout the halls.  All testing while in the emergency department for any complications related to the procedure were negative.  No further workup is needed. Per vascular surgery it is okay to discharge today.  She was instructed to make sure she takes her aspirin and her Plavix daily as ordered.   DVT prophylaxis:  ASA 81 mg daily, Plavix 75 mg Daily, Heparin SQ 5000 units Q8hrs   Marcie Bal [facility] 04/07/2023 8:42 AM

## 2023-04-08 ENCOUNTER — Telehealth (INDEPENDENT_AMBULATORY_CARE_PROVIDER_SITE_OTHER): Payer: Self-pay

## 2023-04-08 NOTE — Telephone Encounter (Signed)
Patient called about her follow up appointment on 05/05/2023 with a NP and she would rather see a Dr, preferably the one that done her surgery. Can you change her appointment to a Dew day?

## 2023-05-04 ENCOUNTER — Other Ambulatory Visit (INDEPENDENT_AMBULATORY_CARE_PROVIDER_SITE_OTHER): Payer: Self-pay | Admitting: Vascular Surgery

## 2023-05-04 DIAGNOSIS — K551 Chronic vascular disorders of intestine: Secondary | ICD-10-CM

## 2023-05-04 DIAGNOSIS — Z95820 Peripheral vascular angioplasty status with implants and grafts: Secondary | ICD-10-CM

## 2023-05-05 ENCOUNTER — Encounter (INDEPENDENT_AMBULATORY_CARE_PROVIDER_SITE_OTHER): Payer: Self-pay | Admitting: Nurse Practitioner

## 2023-05-05 ENCOUNTER — Ambulatory Visit (INDEPENDENT_AMBULATORY_CARE_PROVIDER_SITE_OTHER): Payer: Medicare HMO | Admitting: Nurse Practitioner

## 2023-05-05 ENCOUNTER — Ambulatory Visit (INDEPENDENT_AMBULATORY_CARE_PROVIDER_SITE_OTHER): Payer: Medicare HMO

## 2023-05-05 VITALS — BP 132/70 | HR 72 | Resp 18 | Ht 67.0 in | Wt 179.2 lb

## 2023-05-05 DIAGNOSIS — Z95820 Peripheral vascular angioplasty status with implants and grafts: Secondary | ICD-10-CM | POA: Diagnosis not present

## 2023-05-05 DIAGNOSIS — I771 Stricture of artery: Secondary | ICD-10-CM | POA: Diagnosis not present

## 2023-05-05 DIAGNOSIS — K551 Chronic vascular disorders of intestine: Secondary | ICD-10-CM | POA: Diagnosis not present

## 2023-05-05 DIAGNOSIS — E1140 Type 2 diabetes mellitus with diabetic neuropathy, unspecified: Secondary | ICD-10-CM | POA: Diagnosis not present

## 2023-05-05 DIAGNOSIS — I10 Essential (primary) hypertension: Secondary | ICD-10-CM | POA: Diagnosis not present

## 2023-05-05 NOTE — Progress Notes (Signed)
Subjective:    Patient ID: Jessica Donovan, female    DOB: 11/27/1946, 77 y.o.   MRN: [id number] Chief Complaint  Patient presents with   Follow-up    4 week followup with MESS    Jessica Donovan is a 77 year old female who returns today after intervention on her superior mesenteric artery on 04/05/2023.  The patient had a angiogram on 04/05/2023 for treatment of superior mesenteric ischemia.  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  She notes that since the intervention she has not had any episodes of diarrhea or pain.  She notes that she still does not have a significant appetite but the nausea and discomfort that she had post eating have subsided.  Following the intervention the patient had hematoma but that is also resolved.  Overall she has been doing fairly well postintervention.  Today noninvasive studies show a patent SMA stent.  No evidence of restenosis.  Patient continues to have a 70 to 99% stenosis in the celiac artery.    Review of Systems  Constitutional:  Positive for appetite change.  Gastrointestinal:  Negative for diarrhea and nausea.  All other systems reviewed and are negative.      Objective:   Physical Exam Vitals reviewed.  HENT:     Head: Normocephalic.  Cardiovascular:     Rate and Rhythm: Normal rate.     Pulses:          Dorsalis pedis pulses are 1+ on the right side and 1+ on the left side.       Posterior tibial pulses are 1+ on the right side and 1+ on the left side.  Pulmonary:     Effort: Pulmonary effort is normal.  Skin:    General: Skin is warm and dry.  Neurological:     Mental Status: She is alert and oriented to person, place, and time.  Psychiatric:        Mood and Affect: Mood normal.        Behavior: Behavior normal.        Thought Content: Thought content normal.        Judgment: Judgment normal.     BP 132/70 (BP  Location: Right Arm)   Pulse 72   Resp 18   Ht 5\' 7"  (1.702 m)   Wt 179 lb 3.2 oz (81.3 kg)   BMI 28.07 kg/m   Past Medical History:  Diagnosis Date   Anxiety    Chronic back pain    Collagen vascular disease (HCC)    Coronary artery disease    a. s/p PCI/DES to dRCA & mLCx in 2014 with repeat LHC in 10/2015 showing patent stents   Diabetes mellitus without complication (HCC)    Hypertension    Low back pain    Myocardial infarction (HCC)    Osteomyelitis of toe (HCC) 01/23/2016   Stroke [facility])     Social History   Socioeconomic History   Marital status: Widowed    Spouse name: Not on file   Number of children: Not on file   Years of education: Not on file   Highest education level: Not on file  Occupational History   Not on file  Tobacco Use   Smoking status: Never   Smokeless tobacco: Never  Vaping Use   Vaping Use: Never used  Substance and Sexual Activity   Alcohol use: No  Drug use: No   Sexual activity: Not Currently  Other Topics Concern   Not on file  Social History Narrative   Not on file   Social Determinants of Health   Financial Resource Strain: Not on file  Food Insecurity: No Food Insecurity (04/07/2023)   Hunger Vital Sign    Worried About Running Out of Food in the Last Year: Never true    Ran Out of Food in the Last Year: Never true  Transportation Needs: No Transportation Needs (04/07/2023)   PRAPARE - Administrator, Civil Service (Medical): No    Lack of Transportation (Non-Medical): No  Physical Activity: Not on file  Stress: Not on file  Social Connections: Not on file  Intimate Partner Violence: Not At Risk (04/07/2023)   Humiliation, Afraid, Rape, and Kick questionnaire    Fear of Current or Ex-Partner: No    Emotionally Abused: No    Physically Abused: No    Sexually Abused: No    Past Surgical History:  Procedure Laterality Date   AMPUTATION TOE Left 11/10/2015   Procedure: AMPUTATION TOE;  Surgeon: Linus Galas,  MD;  Location: [facility] ORS;  Service: Podiatry;  Laterality: Left;   AMPUTATION TOE Left 01/24/2016   Procedure: AMPUTATION TOE (2nd mpj);  Surgeon: Linus Galas, DPM;  Location: [facility] ORS;  Service: Podiatry;  Laterality: Left;   AMPUTATION TOE Right 12/21/2018   Procedure: 2ND TOE AMPUTATION WITH DEBRIDEMENT OF SOFT TISSUE;  Surgeon: Recardo Evangelist, DPM;  Location: [facility] ORS;  Service: Podiatry;  Laterality: Right;   AMPUTATION TOE Left 06/12/2022   Procedure: AMPUTATION TOE; PARTIAL AMPUTATION LEFT GREAT TOE;  Surgeon: Linus Galas, DPM;  Location: [facility] ORS;  Service: Podiatry;  Laterality: Left;   CARDIAC CATHETERIZATION N/A 11/12/2015   Procedure: Left Heart Cath and Coronary Angiography;  Surgeon: Marcina Millard, MD;  Location: [facility] INVASIVE CV LAB;  Service: Cardiovascular;  Laterality: N/A;   COLONOSCOPY WITH PROPOFOL N/A 07/20/2017   Procedure: COLONOSCOPY WITH PROPOFOL;  Surgeon: Wyline Mood, MD;  Location: [facility] ENDOSCOPY;  Service: Endoscopy;  Laterality: N/A;   JOINT REPLACEMENT     TOE AMPUTATION     VISCERAL ANGIOGRAPHY N/A 04/05/2023   Procedure: VISCERAL ANGIOGRAPHY;  Surgeon: Annice Needy, MD;  Location: [facility] INVASIVE CV LAB;  Service: Cardiovascular;  Laterality: N/A;    Family History  Problem Relation Age of Onset   CAD Mother    CAD Father     Allergies  Allergen Reactions   Amoxicillin Diarrhea   Ampicillin Nausea And Vomiting and Other (See Comments)    Did it involve swelling of the face/tongue/throat, SOB, or low BP? Yes Did it involve sudden or severe rash/hives, skin peeling, or any reaction on the inside of your mouth or nose? No Did you need to seek medical attention at a hospital or doctor's office? No When did it last happen? >10 years If all above answers are "NO", may proceed with cephalosporin use.    Augmentin [Amoxicillin-Pot Clavulanate] Diarrhea   Bactrim [Sulfamethoxazole-Trimethoprim] Other (See Comments)    N/V/D   Codeine Nausea And Vomiting    Egg-Derived Products     ALLergic to cook or raw egg only. Food with egg ingredient is okay for patient.    Influenza Vaccines Other (See Comments)    Reaction:  Caused pt to pass out    Influenza Virus Vaccine Other (See Comments)    Reaction: Passed out for 12 days   Methadone Hives and Itching  Oxycodone-Acetaminophen Nausea And Vomiting   Percocet [Oxycodone-Acetaminophen] Nausea And Vomiting   Sulfa Antibiotics Other (See Comments)    Reaction: unknown   Tetanus Antitoxin Swelling and Other (See Comments)    Reaction: injection site swelling   Tetanus Toxoids Swelling and Other (See Comments)    Reaction:  Swelling at injection site   Penicillin G Rash    Has patient had a PCN reaction causing immediate rash, facial/tongue/throat swelling, SOB or lightheadedness with hypotension: Yes Has patient had a PCN reaction causing severe rash involving mucus membranes or skin necrosis: No Has patient had a PCN reaction that required hospitalization: No Has patient had a PCN reaction occurring within the last 10 years: No If all of the above answers are "NO", then may proceed with Cephalosporin use..   Tetracyclines & Related Rash       Latest Ref Rng & Units 04/07/2023    4:56 AM 04/06/2023   11:26 AM 11/17/2022    3:21 PM  CBC  WBC 4.0 - 10.5 K/uL 7.5  7.1  10.5   Hemoglobin 12.0 - 15.0 g/dL 38.7  56.4  33.2   Hematocrit 36.0 - 46.0 % 39.3  41.7  41.2   Platelets 150 - 400 K/uL 296  299  298       CMP     Component Value Date/Time   NA 134 (L) 04/07/2023 0456   NA 142 12/15/2014 0958   K 4.3 04/07/2023 0456   K 4.0 12/15/2014 0958   CL 101 04/07/2023 0456   CL 109 (H) 12/15/2014 0958   CO2 25 04/07/2023 0456   CO2 26 12/15/2014 0958   GLUCOSE 213 (H) 04/07/2023 0456   GLUCOSE 181 (H) 12/15/2014 0958   BUN 18 04/07/2023 0456   BUN 19 (H) 12/15/2014 0958   CREATININE 0.78 04/07/2023 0456   CREATININE 0.67 12/15/2014 0958   CALCIUM 9.5 04/07/2023 0456   CALCIUM 8.4  (L) 12/15/2014 0958   PROT 6.2 (L) 04/06/2023 1126   ALBUMIN 3.7 04/06/2023 1126   AST 24 04/06/2023 1126   ALT 9 04/06/2023 1126   ALKPHOS 67 04/06/2023 1126   BILITOT 0.5 04/06/2023 1126   GFRNONAA >60 04/07/2023 0456   GFRNONAA >60 12/15/2014 0958   GFRAA >60 03/27/2020 0126   GFRAA >60 12/15/2014 0958     No results found.     Assessment & Plan:   1. Mesenteric artery stenosis (HCC) Recommend:  The patient is status post successful angiogram with intervention of the mesenteric vessels.    The patient reports that the abdominal pain is improved and the post prandial symptoms are essentially gone.   The patient denies lifestyle limiting changes at this point in time.  No further invasive studies, angiography or surgery at this time The patient should continue walking and begin a more formal exercise program.  The patient should continue antiplatelet therapy and aggressive treatment of the lipid abnormalities  Patient should undergo noninvasive studies as ordered. The patient will follow up with me after the studies.   2. Celiac artery stenosis (HCC) The patient has noted improvement following angiogram as noted above.  She does continue to have stenosis in the celiac artery however we will not plan on moving forward with intervention as her symptoms have shown some noted improvement.  Will reevaluate the patient symptoms at her follow-up in 3 months to determine if she may need further intervention or if we can continue to monitor.  3. Primary hypertension Continue antihypertensive medications  as already ordered, these medications have been reviewed and there are no changes at this time.  4. Type 2 diabetes mellitus with diabetic neuropathy, unspecified whether long term insulin use (HCC) Continue hypoglycemic medications as already ordered, these medications have been reviewed and there are no changes at this time.  Hgb A1C to be monitored as already arranged by primary  service   Current Outpatient Medications on File Prior to Visit  Medication Sig Dispense Refill   ACCU-CHEK GUIDE test strip TEST 3 TIMES DAILY AS DIRECTED     acetaminophen (TYLENOL) 500 MG tablet Take 1,000 mg by mouth every 8 (eight) hours as needed for mild pain or headache.      ALPRAZolam (XANAX) 0.5 MG tablet Take 0.5 mg by mouth 4 (four) times daily as needed for anxiety.     AMBULATORY NON FORMULARY MEDICATION Compounded estrogen  Apply 1 gram vaginally M,W, Friday [facility] 1 applicator 0   aspirin 81 MG tablet Take 81 mg by mouth daily.     atorvastatin (LIPITOR) 20 MG tablet Take 20 mg by mouth daily.      Blood Glucose Monitoring Suppl (ONE TOUCH ULTRA 2) w/Device KIT SMARTSIG:1 Via Meter Daily     celecoxib (CELEBREX) 200 MG capsule Take 200 mg by mouth daily.     cyclobenzaprine (FLEXERIL) 10 MG tablet Take 1 tablet by mouth 3 (three) times daily.     insulin aspart (NOVOLOG) 100 UNIT/ML injection Inject 3-15 Units into the skin 3 (three) times daily with meals as needed for high blood sugar. Pt uses as needed per sliding scale:    Less than 140:  0 units  140-180:  3 units 181-220:  4 units 221- 260:  6 units 261- 320:  8 units 321-360:  10 units 361-400:  12 units Greater than 400:  15 units     isosorbide mononitrate (IMDUR) 30 MG 24 hr tablet Take 30 mg by mouth daily.     Lancets (ONETOUCH DELICA PLUS LANCET33G) MISC Apply topically.     lisinopril (ZESTRIL) 20 MG tablet Take 20 mg by mouth daily.     mirtazapine (REMERON) 15 MG tablet Take 15 mg by mouth at bedtime.     morphine (MSIR) 15 MG tablet Take 15 mg by mouth 2 (two) times daily.     Multiple Vitamins-Minerals (HAIR SKIN AND NAILS FORMULA PO) Take by mouth daily. 2 gummies daily     omeprazole (PRILOSEC) 40 MG capsule Take 1 capsule (40 mg total) by mouth daily.     polyvinyl alcohol (LIQUIFILM TEARS) 1.4 % ophthalmic solution Place 1 drop into the right eye 4 (four) times daily. 15 mL 0    pregabalin (LYRICA) 100 MG capsule Take 100 mg by mouth 3 (three) times daily.     TOUJEO SOLOSTAR 300 UNIT/ML SOPN Inject 60 Units into the skin every morning.     traZODone (DESYREL) 100 MG tablet Take 0.5 tablets (50 mg total) by mouth at bedtime as needed for sleep.     venlafaxine XR (EFFEXOR-XR) 75 MG 24 hr capsule Take 75 mg by mouth 2 (two) times daily.     Vitamin D, Ergocalciferol, (DRISDOL) 50000 units CAPS capsule Take 50,000 Units by mouth every Sunday.     clopidogrel (PLAVIX) 75 MG tablet Take 1 tablet (75 mg total) by mouth daily. (Patient not taking: Reported on 05/05/2023) 30 tablet 11   metoprolol tartrate (LOPRESSOR) 25 MG tablet Take 1 tablet (25 mg total) by mouth 2 (  two) times daily. 60 tablet 0   No current facility-administered medications on file prior to visit.    There are no Patient Instructions on file for this visit. No follow-ups on file.   Georgiana Spinner, NP

## 2023-07-19 ENCOUNTER — Encounter: Admission: EM | Disposition: E | Payer: Self-pay | Source: Home / Self Care | Attending: Internal Medicine

## 2023-07-19 ENCOUNTER — Emergency Department: Payer: Medicare HMO

## 2023-07-19 ENCOUNTER — Other Ambulatory Visit: Payer: Self-pay

## 2023-07-19 ENCOUNTER — Inpatient Hospital Stay
Admission: EM | Admit: 2023-07-19 | Discharge: 2023-07-22 | DRG: 853 | Disposition: E | Payer: Medicare HMO | Attending: Student in an Organized Health Care Education/Training Program | Admitting: Student in an Organized Health Care Education/Training Program

## 2023-07-19 ENCOUNTER — Encounter: Payer: Self-pay | Admitting: Radiology

## 2023-07-19 DIAGNOSIS — E669 Obesity, unspecified: Secondary | ICD-10-CM | POA: Diagnosis present

## 2023-07-19 DIAGNOSIS — T82858A Stenosis of vascular prosthetic devices, implants and grafts, initial encounter: Secondary | ICD-10-CM | POA: Diagnosis not present

## 2023-07-19 DIAGNOSIS — G894 Chronic pain syndrome: Secondary | ICD-10-CM | POA: Diagnosis present

## 2023-07-19 DIAGNOSIS — R6521 Severe sepsis with septic shock: Secondary | ICD-10-CM | POA: Diagnosis present

## 2023-07-19 DIAGNOSIS — I700 Atherosclerosis of aorta: Secondary | ICD-10-CM | POA: Diagnosis present

## 2023-07-19 DIAGNOSIS — I10 Essential (primary) hypertension: Secondary | ICD-10-CM

## 2023-07-19 DIAGNOSIS — K55059 Acute (reversible) ischemia of intestine, part and extent unspecified: Secondary | ICD-10-CM

## 2023-07-19 DIAGNOSIS — Z1152 Encounter for screening for COVID-19: Secondary | ICD-10-CM | POA: Diagnosis not present

## 2023-07-19 DIAGNOSIS — T82868A Thrombosis of vascular prosthetic devices, implants and grafts, initial encounter: Secondary | ICD-10-CM | POA: Diagnosis present

## 2023-07-19 DIAGNOSIS — Z9861 Coronary angioplasty status: Secondary | ICD-10-CM | POA: Diagnosis not present

## 2023-07-19 DIAGNOSIS — F32A Depression, unspecified: Secondary | ICD-10-CM | POA: Diagnosis present

## 2023-07-19 DIAGNOSIS — Z91012 Allergy to eggs: Secondary | ICD-10-CM

## 2023-07-19 DIAGNOSIS — G928 Other toxic encephalopathy: Secondary | ICD-10-CM | POA: Diagnosis present

## 2023-07-19 DIAGNOSIS — Z887 Allergy status to serum and vaccine status: Secondary | ICD-10-CM

## 2023-07-19 DIAGNOSIS — Z7982 Long term (current) use of aspirin: Secondary | ICD-10-CM | POA: Diagnosis not present

## 2023-07-19 DIAGNOSIS — Z86718 Personal history of other venous thrombosis and embolism: Secondary | ICD-10-CM

## 2023-07-19 DIAGNOSIS — K567 Ileus, unspecified: Secondary | ICD-10-CM | POA: Insufficient documentation

## 2023-07-19 DIAGNOSIS — K921 Melena: Secondary | ICD-10-CM

## 2023-07-19 DIAGNOSIS — R109 Unspecified abdominal pain: Secondary | ICD-10-CM | POA: Diagnosis present

## 2023-07-19 DIAGNOSIS — Z91199 Patient's noncompliance with other medical treatment and regimen due to unspecified reason: Secondary | ICD-10-CM

## 2023-07-19 DIAGNOSIS — E872 Acidosis, unspecified: Secondary | ICD-10-CM | POA: Insufficient documentation

## 2023-07-19 DIAGNOSIS — E119 Type 2 diabetes mellitus without complications: Secondary | ICD-10-CM

## 2023-07-19 DIAGNOSIS — K559 Vascular disorder of intestine, unspecified: Secondary | ICD-10-CM | POA: Diagnosis not present

## 2023-07-19 DIAGNOSIS — Z888 Allergy status to other drugs, medicaments and biological substances status: Secondary | ICD-10-CM

## 2023-07-19 DIAGNOSIS — Z66 Do not resuscitate: Secondary | ICD-10-CM | POA: Diagnosis present

## 2023-07-19 DIAGNOSIS — Z794 Long term (current) use of insulin: Secondary | ICD-10-CM | POA: Diagnosis not present

## 2023-07-19 DIAGNOSIS — I4891 Unspecified atrial fibrillation: Secondary | ICD-10-CM | POA: Insufficient documentation

## 2023-07-19 DIAGNOSIS — F419 Anxiety disorder, unspecified: Secondary | ICD-10-CM | POA: Diagnosis present

## 2023-07-19 DIAGNOSIS — K551 Chronic vascular disorders of intestine: Secondary | ICD-10-CM | POA: Diagnosis present

## 2023-07-19 DIAGNOSIS — Z8249 Family history of ischemic heart disease and other diseases of the circulatory system: Secondary | ICD-10-CM | POA: Diagnosis not present

## 2023-07-19 DIAGNOSIS — D72825 Bandemia: Secondary | ICD-10-CM | POA: Diagnosis not present

## 2023-07-19 DIAGNOSIS — E1169 Type 2 diabetes mellitus with other specified complication: Secondary | ICD-10-CM | POA: Diagnosis present

## 2023-07-19 DIAGNOSIS — I2510 Atherosclerotic heart disease of native coronary artery without angina pectoris: Secondary | ICD-10-CM

## 2023-07-19 DIAGNOSIS — Z9889 Other specified postprocedural states: Secondary | ICD-10-CM | POA: Diagnosis not present

## 2023-07-19 DIAGNOSIS — Z955 Presence of coronary angioplasty implant and graft: Secondary | ICD-10-CM

## 2023-07-19 DIAGNOSIS — Z885 Allergy status to narcotic agent status: Secondary | ICD-10-CM

## 2023-07-19 DIAGNOSIS — K55069 Acute infarction of intestine, part and extent unspecified: Secondary | ICD-10-CM | POA: Diagnosis not present

## 2023-07-19 DIAGNOSIS — R579 Shock, unspecified: Secondary | ICD-10-CM

## 2023-07-19 DIAGNOSIS — I252 Old myocardial infarction: Secondary | ICD-10-CM

## 2023-07-19 DIAGNOSIS — A419 Sepsis, unspecified organism: Principal | ICD-10-CM | POA: Diagnosis present

## 2023-07-19 DIAGNOSIS — M545 Low back pain, unspecified: Secondary | ICD-10-CM | POA: Diagnosis present

## 2023-07-19 DIAGNOSIS — N179 Acute kidney failure, unspecified: Secondary | ICD-10-CM | POA: Diagnosis present

## 2023-07-19 DIAGNOSIS — Z89422 Acquired absence of other left toe(s): Secondary | ICD-10-CM | POA: Diagnosis not present

## 2023-07-19 DIAGNOSIS — Y828 Other medical devices associated with adverse incidents: Secondary | ICD-10-CM | POA: Diagnosis present

## 2023-07-19 DIAGNOSIS — R7401 Elevation of levels of liver transaminase levels: Secondary | ICD-10-CM | POA: Insufficient documentation

## 2023-07-19 DIAGNOSIS — Z881 Allergy status to other antibiotic agents status: Secondary | ICD-10-CM | POA: Diagnosis not present

## 2023-07-19 DIAGNOSIS — Z6829 Body mass index (BMI) 29.0-29.9, adult: Secondary | ICD-10-CM

## 2023-07-19 DIAGNOSIS — G9340 Encephalopathy, unspecified: Secondary | ICD-10-CM | POA: Diagnosis present

## 2023-07-19 DIAGNOSIS — Z880 Allergy status to penicillin: Secondary | ICD-10-CM

## 2023-07-19 DIAGNOSIS — K6389 Other specified diseases of intestine: Secondary | ICD-10-CM | POA: Insufficient documentation

## 2023-07-19 DIAGNOSIS — Z79899 Other long term (current) drug therapy: Secondary | ICD-10-CM

## 2023-07-19 DIAGNOSIS — E876 Hypokalemia: Secondary | ICD-10-CM | POA: Diagnosis not present

## 2023-07-19 DIAGNOSIS — Z882 Allergy status to sulfonamides status: Secondary | ICD-10-CM

## 2023-07-19 DIAGNOSIS — I472 Ventricular tachycardia, unspecified: Secondary | ICD-10-CM | POA: Insufficient documentation

## 2023-07-19 DIAGNOSIS — Z7902 Long term (current) use of antithrombotics/antiplatelets: Secondary | ICD-10-CM

## 2023-07-19 DIAGNOSIS — F418 Other specified anxiety disorders: Secondary | ICD-10-CM

## 2023-07-19 DIAGNOSIS — Z515 Encounter for palliative care: Secondary | ICD-10-CM | POA: Diagnosis not present

## 2023-07-19 DIAGNOSIS — Z89421 Acquired absence of other right toe(s): Secondary | ICD-10-CM | POA: Diagnosis not present

## 2023-07-19 DIAGNOSIS — Z8673 Personal history of transient ischemic attack (TIA), and cerebral infarction without residual deficits: Secondary | ICD-10-CM

## 2023-07-19 DIAGNOSIS — D72829 Elevated white blood cell count, unspecified: Secondary | ICD-10-CM

## 2023-07-19 HISTORY — PX: VISCERAL ANGIOGRAPHY: CATH118276

## 2023-07-19 LAB — LIPASE, BLOOD: Lipase: 38 U/L (ref 11–51)

## 2023-07-19 LAB — CBC
HCT: 48.9 % — ABNORMAL HIGH (ref 36.0–46.0)
Hemoglobin: 16.3 g/dL — ABNORMAL HIGH (ref 12.0–15.0)
MCH: 31.9 pg (ref 26.0–34.0)
MCHC: 33.3 g/dL (ref 30.0–36.0)
MCV: 95.7 fL (ref 80.0–100.0)
Platelets: 426 10*3/uL — ABNORMAL HIGH (ref 150–400)
RBC: 5.11 MIL/uL (ref 3.87–5.11)
RDW: 12.5 % (ref 11.5–15.5)
WBC: 24.7 10*3/uL — ABNORMAL HIGH (ref 4.0–10.5)
nRBC: 0 % (ref 0.0–0.2)

## 2023-07-19 LAB — COMPREHENSIVE METABOLIC PANEL
ALT: 12 U/L (ref 0–44)
AST: 37 U/L (ref 15–41)
Albumin: 4.5 g/dL (ref 3.5–5.0)
Alkaline Phosphatase: 81 U/L (ref 38–126)
Anion gap: 15 (ref 5–15)
BUN: 21 mg/dL (ref 8–23)
CO2: 21 mmol/L — ABNORMAL LOW (ref 22–32)
Calcium: 10.1 mg/dL (ref 8.9–10.3)
Chloride: 99 mmol/L (ref 98–111)
Creatinine, Ser: 0.76 mg/dL (ref 0.44–1.00)
GFR, Estimated: >60 mL/min (ref >60)
Glucose, Bld: 282 mg/dL — ABNORMAL HIGH (ref 70–99)
Potassium: 3.6 mmol/L (ref 3.5–5.1)
Sodium: 135 mmol/L (ref 135–145)
Total Bilirubin: 0.6 mg/dL (ref 0.3–1.2)
Total Protein: 7.6 g/dL (ref 6.5–8.1)

## 2023-07-19 LAB — GLUCOSE, CAPILLARY
Glucose-Capillary: 259 mg/dL — ABNORMAL HIGH (ref 70–99)
Glucose-Capillary: 285 mg/dL — ABNORMAL HIGH (ref 70–99)

## 2023-07-19 LAB — LACTIC ACID, PLASMA
Lactic Acid, Venous: 3.6 mmol/L (ref 0.5–1.9)
Lactic Acid, Venous: 2.7 mmol/L (ref 0.5–1.9)
Lactic Acid, Venous: 2.1 mmol/L (ref 0.5–1.9)

## 2023-07-19 LAB — APTT: aPTT: 26 seconds (ref 24–36)

## 2023-07-19 LAB — SARS CORONAVIRUS 2 BY RT PCR: SARS Coronavirus 2 by RT PCR: NEGATIVE

## 2023-07-19 LAB — PROTIME-INR
INR: 1.1 (ref 0.8–1.2)
Prothrombin Time: 14.6 seconds (ref 11.4–15.2)

## 2023-07-19 SURGERY — VISCERAL ANGIOGRAPHY
Anesthesia: Moderate Sedation

## 2023-07-19 MED ORDER — IOHEXOL 350 MG/ML SOLN
100.0000 mL | Freq: Once | INTRAVENOUS | Status: AC | PRN
  Administered 2023-07-19: 100 mL via INTRAVENOUS

## 2023-07-19 MED ORDER — SODIUM CHLORIDE 0.9% FLUSH
3.0000 mL | Freq: Two times a day (BID) | INTRAVENOUS | Status: DC
  Administered 2023-07-19 – 2023-07-20 (×3): 3 mL via INTRAVENOUS

## 2023-07-19 MED ORDER — HEPARIN (PORCINE) IN NACL 2000-0.9 UNIT/L-% IV SOLN
INTRAVENOUS | Status: DC | PRN
  Administered 2023-07-19: 1000 mL

## 2023-07-19 MED ORDER — HYDRALAZINE HCL 20 MG/ML IJ SOLN
INTRAMUSCULAR | Status: DC | PRN
  Administered 2023-07-19: 20 mg via INTRAVENOUS

## 2023-07-19 MED ORDER — MIDAZOLAM HCL 5 MG/5ML IJ SOLN
INTRAMUSCULAR | Status: AC
  Filled 2023-07-19: qty 5

## 2023-07-19 MED ORDER — HYDROMORPHONE HCL 1 MG/ML IJ SOLN
1.0000 mg | Freq: Once | INTRAMUSCULAR | Status: DC | PRN
  Administered 2023-07-19: 1 mg via INTRAVENOUS

## 2023-07-19 MED ORDER — ISOSORBIDE MONONITRATE ER 30 MG PO TB24
30.0000 mg | ORAL_TABLET | Freq: Every day | ORAL | Status: DC
  Administered 2023-07-20: 30 mg via ORAL
  Filled 2023-07-19: qty 1

## 2023-07-19 MED ORDER — HYDROMORPHONE HCL 1 MG/ML IJ SOLN
1.0000 mg | Freq: Once | INTRAMUSCULAR | Status: AC
  Administered 2023-07-19: 1 mg via INTRAVENOUS
  Filled 2023-07-19: qty 1

## 2023-07-19 MED ORDER — FENTANYL CITRATE (PF) 100 MCG/2ML IJ SOLN
INTRAMUSCULAR | Status: AC
  Filled 2023-07-19: qty 2

## 2023-07-19 MED ORDER — TRAZODONE HCL 50 MG PO TABS: 50.0000 mg | ORAL_TABLET | Freq: Every evening | ORAL | Status: DC | PRN

## 2023-07-19 MED ORDER — LISINOPRIL 20 MG PO TABS
20.0000 mg | ORAL_TABLET | Freq: Every day | ORAL | Status: DC
  Administered 2023-07-20: 20 mg via ORAL
  Filled 2023-07-19: qty 1

## 2023-07-19 MED ORDER — APIXABAN 5 MG PO TABS
5.0000 mg | ORAL_TABLET | Freq: Two times a day (BID) | ORAL | Status: DC
  Filled 2023-07-19 (×2): qty 1

## 2023-07-19 MED ORDER — MIDAZOLAM HCL 2 MG/2ML IJ SOLN
INTRAMUSCULAR | Status: DC | PRN
  Administered 2023-07-19: .5 mg via INTRAVENOUS
  Administered 2023-07-19: 1 mg via INTRAVENOUS
  Administered 2023-07-19: 2 mg via INTRAVENOUS

## 2023-07-19 MED ORDER — METOCLOPRAMIDE HCL 5 MG/ML IJ SOLN
10.0000 mg | INTRAMUSCULAR | Status: AC
  Administered 2023-07-19: 10 mg via INTRAVENOUS
  Filled 2023-07-19: qty 2

## 2023-07-19 MED ORDER — METRONIDAZOLE 500 MG/100ML IV SOLN: 500.0000 mg | Freq: Once | INTRAVENOUS | Status: DC

## 2023-07-19 MED ORDER — MORPHINE SULFATE (PF) 4 MG/ML IV SOLN
4.0000 mg | Freq: Once | INTRAVENOUS | Status: AC
  Administered 2023-07-19: 4 mg via INTRAVENOUS
  Filled 2023-07-19: qty 1

## 2023-07-19 MED ORDER — POLYVINYL ALCOHOL 1.4 % OP SOLN
1.0000 [drp] | Freq: Four times a day (QID) | OPHTHALMIC | Status: DC
  Administered 2023-07-19 – 2023-07-21 (×5): 1 [drp] via OPHTHALMIC
  Filled 2023-07-19: qty 15

## 2023-07-19 MED ORDER — LIDOCAINE HCL (PF) 1 % IJ SOLN
INTRAMUSCULAR | Status: DC | PRN
  Administered 2023-07-19: 20 mL

## 2023-07-19 MED ORDER — FENTANYL CITRATE (PF) 100 MCG/2ML IJ SOLN: 12.5000 ug | Freq: Once | INTRAMUSCULAR | Status: DC | PRN

## 2023-07-19 MED ORDER — ONDANSETRON HCL 4 MG/2ML IJ SOLN
4.0000 mg | Freq: Once | INTRAMUSCULAR | Status: AC
  Administered 2023-07-19: 4 mg via INTRAVENOUS
  Filled 2023-07-19: qty 2

## 2023-07-19 MED ORDER — HYDROMORPHONE HCL 1 MG/ML IJ SOLN
INTRAMUSCULAR | Status: AC
  Filled 2023-07-19: qty 1

## 2023-07-19 MED ORDER — ASPIRIN 81 MG PO TBEC
81.0000 mg | DELAYED_RELEASE_TABLET | Freq: Every day | ORAL | Status: DC
  Administered 2023-07-20: 81 mg via ORAL
  Filled 2023-07-19: qty 1

## 2023-07-19 MED ORDER — MIDAZOLAM HCL 2 MG/ML PO SYRP: 8.0000 mg | ORAL_SOLUTION | Freq: Once | ORAL | Status: DC | PRN

## 2023-07-19 MED ORDER — IODIXANOL 320 MG/ML IV SOLN
INTRAVENOUS | Status: DC | PRN
  Administered 2023-07-19: 40 mL

## 2023-07-19 MED ORDER — ONDANSETRON HCL 4 MG PO TABS: 4.0000 mg | ORAL_TABLET | Freq: Four times a day (QID) | ORAL | Status: DC | PRN

## 2023-07-19 MED ORDER — METOPROLOL TARTRATE 25 MG PO TABS
25.0000 mg | ORAL_TABLET | Freq: Two times a day (BID) | ORAL | Status: DC
  Administered 2023-07-19 – 2023-07-20 (×2): 25 mg via ORAL
  Filled 2023-07-19 (×2): qty 1

## 2023-07-19 MED ORDER — INSULIN GLARGINE-YFGN 100 UNIT/ML ~~LOC~~ SOLN
30.0000 [IU] | Freq: Every day | SUBCUTANEOUS | Status: DC
  Administered 2023-07-19: 30 [IU] via SUBCUTANEOUS
  Filled 2023-07-19 (×2): qty 0.3

## 2023-07-19 MED ORDER — MIRTAZAPINE 15 MG PO TABS
15.0000 mg | ORAL_TABLET | Freq: Every day | ORAL | Status: DC
  Administered 2023-07-19: 15 mg via ORAL
  Filled 2023-07-19: qty 1

## 2023-07-19 MED ORDER — HEPARIN (PORCINE) 25000 UT/250ML-% IV SOLN
1250.0000 [IU]/h | INTRAVENOUS | Status: DC
  Administered 2023-07-19 – 2023-07-20 (×2): 1250 [IU]/h via INTRAVENOUS
  Filled 2023-07-19 (×2): qty 250

## 2023-07-19 MED ORDER — ATORVASTATIN CALCIUM 10 MG PO TABS
10.0000 mg | ORAL_TABLET | Freq: Every day | ORAL | Status: DC
  Administered 2023-07-20: 10 mg via ORAL
  Filled 2023-07-19 (×2): qty 1

## 2023-07-19 MED ORDER — SODIUM CHLORIDE 0.9 % IV SOLN: INTRAVENOUS | Status: DC

## 2023-07-19 MED ORDER — VANCOMYCIN HCL IN DEXTROSE 1-5 GM/200ML-% IV SOLN: 1000.0000 mg | INTRAVENOUS | Status: DC

## 2023-07-19 MED ORDER — ONDANSETRON HCL 4 MG/2ML IJ SOLN
4.0000 mg | Freq: Four times a day (QID) | INTRAMUSCULAR | Status: DC | PRN
  Administered 2023-07-19 – 2023-07-20 (×2): 4 mg via INTRAVENOUS
  Filled 2023-07-19 (×2): qty 2

## 2023-07-19 MED ORDER — FAMOTIDINE 20 MG PO TABS: 40.0000 mg | ORAL_TABLET | Freq: Once | ORAL | Status: DC | PRN

## 2023-07-19 MED ORDER — HYDROMORPHONE HCL 1 MG/ML IJ SOLN
0.5000 mg | INTRAMUSCULAR | Status: DC | PRN
  Administered 2023-07-19 (×2): 1 mg via INTRAVENOUS
  Administered 2023-07-20 (×2): 0.5 mg via INTRAVENOUS
  Administered 2023-07-20 (×4): 1 mg via INTRAVENOUS
  Administered 2023-07-20: 0.5 mg via INTRAVENOUS
  Administered 2023-07-21 (×4): 1 mg via INTRAVENOUS
  Filled 2023-07-19 (×15): qty 1

## 2023-07-19 MED ORDER — HEPARIN BOLUS VIA INFUSION
4000.0000 [IU] | Freq: Once | INTRAVENOUS | Status: AC
  Administered 2023-07-19: 4000 [IU] via INTRAVENOUS
  Filled 2023-07-19: qty 4000

## 2023-07-19 MED ORDER — METRONIDAZOLE 500 MG/100ML IV SOLN
500.0000 mg | Freq: Two times a day (BID) | INTRAVENOUS | Status: DC
  Administered 2023-07-19: 500 mg via INTRAVENOUS
  Filled 2023-07-19 (×2): qty 100

## 2023-07-19 MED ORDER — HEPARIN SODIUM (PORCINE) 1000 UNIT/ML IJ SOLN
INTRAMUSCULAR | Status: DC | PRN
  Administered 2023-07-19: 5000 [IU] via INTRAVENOUS

## 2023-07-19 MED ORDER — MORPHINE SULFATE ER 15 MG PO TBCR
15.0000 mg | EXTENDED_RELEASE_TABLET | Freq: Two times a day (BID) | ORAL | Status: DC
  Administered 2023-07-19 – 2023-07-20 (×2): 15 mg via ORAL
  Filled 2023-07-19 (×2): qty 1

## 2023-07-19 MED ORDER — ALPRAZOLAM 0.5 MG PO TABS
0.5000 mg | ORAL_TABLET | Freq: Four times a day (QID) | ORAL | Status: DC | PRN
  Administered 2023-07-20 (×2): 0.5 mg via ORAL
  Filled 2023-07-19 (×2): qty 1

## 2023-07-19 MED ORDER — DIPHENHYDRAMINE HCL 50 MG/ML IJ SOLN: 50.0000 mg | Freq: Once | INTRAMUSCULAR | Status: DC | PRN

## 2023-07-19 MED ORDER — HEPARIN SODIUM (PORCINE) 1000 UNIT/ML IJ SOLN
INTRAMUSCULAR | Status: AC
  Filled 2023-07-19: qty 10

## 2023-07-19 MED ORDER — METHYLPREDNISOLONE SODIUM SUCC 125 MG IJ SOLR: 125.0000 mg | Freq: Once | INTRAMUSCULAR | Status: DC | PRN

## 2023-07-19 MED ORDER — SODIUM CHLORIDE 0.9 % IV SOLN
2.0000 g | INTRAVENOUS | Status: DC
  Filled 2023-07-19: qty 20

## 2023-07-19 MED ORDER — FENTANYL CITRATE (PF) 100 MCG/2ML IJ SOLN
INTRAMUSCULAR | Status: DC | PRN
  Administered 2023-07-19: 12.5 ug via INTRAVENOUS
  Administered 2023-07-19: 50 ug via INTRAVENOUS
  Administered 2023-07-19: 25 ug via INTRAVENOUS

## 2023-07-19 MED ORDER — SODIUM CHLORIDE 0.9 % IV SOLN
2.0000 g | Freq: Once | INTRAVENOUS | Status: AC
  Administered 2023-07-19: 2 g via INTRAVENOUS
  Filled 2023-07-19: qty 20

## 2023-07-19 MED ORDER — SODIUM CHLORIDE 0.9 % IV BOLUS
1000.0000 mL | Freq: Once | INTRAVENOUS | Status: AC
  Administered 2023-07-19: 1000 mL via INTRAVENOUS

## 2023-07-19 MED ORDER — CYCLOBENZAPRINE HCL 10 MG PO TABS
10.0000 mg | ORAL_TABLET | Freq: Three times a day (TID) | ORAL | Status: DC | PRN
  Administered 2023-07-20: 10 mg via ORAL
  Filled 2023-07-19: qty 1

## 2023-07-19 MED ORDER — VENLAFAXINE HCL ER 75 MG PO CP24
75.0000 mg | ORAL_CAPSULE | Freq: Two times a day (BID) | ORAL | Status: DC
  Administered 2023-07-19 – 2023-07-20 (×2): 75 mg via ORAL
  Filled 2023-07-19 (×2): qty 1

## 2023-07-19 MED ORDER — HYDRALAZINE HCL 20 MG/ML IJ SOLN
INTRAMUSCULAR | Status: AC
  Filled 2023-07-19: qty 1

## 2023-07-19 MED ORDER — INSULIN ASPART 100 UNIT/ML IJ SOLN
0.0000 [IU] | Freq: Three times a day (TID) | INTRAMUSCULAR | Status: DC
  Filled 2023-07-19: qty 1

## 2023-07-19 MED ORDER — ONDANSETRON HCL 4 MG/2ML IJ SOLN: 4.0000 mg | Freq: Four times a day (QID) | INTRAMUSCULAR | Status: DC | PRN

## 2023-07-19 MED ORDER — ONDANSETRON HCL 4 MG/2ML IJ SOLN
INTRAMUSCULAR | Status: AC
  Filled 2023-07-19: qty 2

## 2023-07-19 MED ORDER — ONDANSETRON HCL 4 MG/2ML IJ SOLN
INTRAMUSCULAR | Status: DC | PRN
  Administered 2023-07-19: 4 mg via INTRAVENOUS

## 2023-07-19 SURGICAL SUPPLY — 21 items
BALLN ULTRVRSE 8X40X75C (BALLOONS) ×1
BALLOON ULTRVRSE 8X40X75C (BALLOONS) IMPLANT
CANISTER PENUMBRA ENGINE (MISCELLANEOUS) IMPLANT
CATH ANGIO 5F PIGTAIL 65CM (CATHETERS) IMPLANT
CATH BEACON 5 .035 65 C2 TIP (CATHETERS) IMPLANT
CATH INDIGO CAT6 KIT (CATHETERS) IMPLANT
CATH KUMPE SOFT-VU 5FR 65 (CATHETERS) IMPLANT
CATH VS15FR (CATHETERS) IMPLANT
DEVICE STARCLOSE SE CLOSURE (Vascular Products) IMPLANT
DEVICE TORQUE (MISCELLANEOUS) IMPLANT
GLIDEWIRE STIFF .35X180X3 HYDR (WIRE) IMPLANT
KIT ENCORE 26 ADVANTAGE (KITS) IMPLANT
PACK ANGIOGRAPHY (CUSTOM PROCEDURE TRAY) ×1 IMPLANT
SHEATH BRITE TIP 5FRX11 (SHEATH) IMPLANT
SHEATH PINNACLE ST 6F 45CM (SHEATH) IMPLANT
STENT LIFESTREAM 7X26X80 (Permanent Stent) IMPLANT
SYR MEDRAD MARK 7 150ML (SYRINGE) IMPLANT
TUBING CONTRAST HIGH PRESS 72 (TUBING) IMPLANT
WIRE G V18X300CM (WIRE) IMPLANT
WIRE GUIDERIGHT .035X150 (WIRE) IMPLANT
WIRE SUPRACORE 300CM (WIRE) IMPLANT

## 2023-07-19 NOTE — Op Note (Signed)
[facility]  Percutaneous Study/Intervention Procedural Note   Date: 07/17/2023  Surgeon(s): Festus Barren, MD  Assistants: none  Pre-operative Diagnosis: 1.  Acute on chronic mesenteric ischemia 2.  Occlusion of previously placed SMA stent   Post-operative diagnosis:  Same  Procedure(s) Performed:             1.  Ultrasound guidance for vascular access right femoral artery              2.  Catheter placement into SMA from right femoral approach             3.  Aortogram and selective angiogram of the SMA             4.  Mechanical thrombectomy of the SMA with the penumbra CAT 6 device             5.  Stent to the SMA with 7 mm diameter x 26 mm length balloon expandable Lifestream stent postdilated with an 8 mm balloon proximally 6.  StarClose closure device right femoral artery  Contrast: 40  Fluoro time: 10  EBL: 5 cc  Anesthesia: Approximately 45 minutes of Moderate conscious sedation using 3.5 mg of Versed and 87.5 mcg of Fentanyl              Indications:  Patient is a 77 y.o. female who has symptoms consistent with mesenteric ischemia.  She has not acute on chronic abdominal pain with markedly elevated lactate.  The patient has a CT scan showing occlusion of the previously placed SMA stent. The patient is brought in for angiography for further evaluation and potential treatment. Risks and benefits are discussed and informed consent is obtained  Procedure:  The patient was identified and appropriate procedural time out was performed.  The patient was then placed supine on the table and prepped and draped in the usual sterile fashion. Moderate conscious sedation was administered during a face to face encounter with the patient throughout the procedure with my supervision of the RN administering medicines and monitoring the patient's vital signs, pulse oximetry, telemetry and mental status throughout from the start of the procedure until the patient was taken  to the recovery room. Ultrasound was used to evaluate the right common femoral artery.  It was patent .  A digital ultrasound image was acquired.  A Seldinger needle was used to access the right common femoral artery under direct ultrasound guidance and a permanent image was performed.  A 0.035 J wire was advanced without resistance and a 5Fr sheath was placed.  Pigtail catheter was placed into the aorta and an AP aortogram was performed. This demonstrated no clear flow in the SMA stent at least from the AP projection.  The renal arteries were patent.  The aorta and iliac arteries were patent. We transitioned to the lateral projection to image the SMA. The lateral image demonstrated occlusion of the SMA stent.  The patient was given 5000 units of IV heparin. We upsized to a 6 Fr sheath.  A V S1 catheter was used to selectively cannulate the superior mesenteric artery.  This demonstrated an occlusion of the SMA with reconstitution through collaterals about 1 to 1-1/2 cm beyond the stent with backfilling back to the stent.  I was able to cross the lesion with a Glidewire and advanced the V S1 catheter to the edge of the stent.  I then exchanged for a supra core wire and placed a 6 [place] destination sheath into  the SMA.  Based on her symptoms and these findings, I elected to treat the SMA to try to improve the patient's clinical course. I Then exchanged for a V18 wire and performed mechanical thrombectomy.  The penumbra CAT 6 device was brought onto the field and 2 passes were made to perform thrombectomy to the SMA stent.  This resulted in a channel of blood flow but there still remained stenosis with thrombus within the previously placed stent although markedly improved.  The penumbra CAT 6 device was taken beyond the stent and the native vessel was cleared of any thrombus beyond the stent.  I then elected to place a new stent in the area.  A 7 mm diameter by 26 mm length Lifestream balloon expandable stent to  perform treatment of the SMA. I inflated the balloon to 12 atm.  I then upsized to an 8 mm balloon and inflated this to 10 atm within the stent in the SMA.  On completion angiogram following this, less than 10% residual stenosis was identified in the SMA.  No residual thrombus was seen.  Good flow was seen in the SMA beyond the stent. At this point, I elected to terminate the procedure. The diagnostic catheter was removed. StarClose closure device was deployed in usual fashion with excellent hemostatic result. The patient was taken to the recovery room in stable condition having tolerated the procedure well.     Findings: Occluded SMA stent, able to be treated and patency restored with thrombectomy and new stent placement  Disposition: Patient was taken to the recovery room in stable condition having tolerated the procedure well.  Complications:  None  Festus Barren 07/03/2023 5:47 PM   This note was created with Dragon Medical transcription system. Any errors in dictation are purely unintentional.

## 2023-07-19 NOTE — Assessment & Plan Note (Signed)
Patient is presenting with severe abdominal pain, hematochezia followed by melena, and evidence of circumferential wall thickening involving the descending and sigmoid colon which may be secondary to SMA occlusion, although I would not expect this region to be supplied by the SMA, but rather the IMA.   - Management of occlusion as noted below - Start empiric antibiotics for now - Monitor CBC daily -

## 2023-07-19 NOTE — H&P (View-Only) (Signed)
Hospital Consult    Reason for Consult:  SMA Occlusion of prior stent.  Requesting Physician:  Dr. Sharman Cheek MD. MRN #:  [id number]  History of Present Illness: This is a 77 y.o. 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 Radiates upward toward the chest. Associated nausea vomiting diarrhea and chills. Reports that her diarrhea appeared bloody 1 episode but after that it has just been dark brown. No hematemesis. Rates pain as 10/10.   On exam this afternoon patient's uncomfortable in the stretcher in the emergency department.  Patient noted to have some rebound tenderness and overall severe pain on palpation.  Patient does have positive bowel sounds.  Patient's vitals all remained stable with elevated blood pressure most likely related to her pain.  No other complaints at this time.  Patient noted to have an elevated lactic acid at 3.6 today.  Past Medical History:  Diagnosis Date   Anxiety    Chronic back pain    Collagen vascular disease (HCC)    Coronary artery disease    a. s/p PCI/DES to dRCA & mLCx in 2014 with repeat LHC in 10/2015 showing patent stents   Diabetes mellitus without complication (HCC)    Hypertension    Low back pain    Myocardial infarction (HCC)    Osteomyelitis of toe (HCC) 01/23/2016   Stroke [facility])     Past Surgical History:  Procedure Laterality Date   AMPUTATION TOE Left 11/10/2015   Procedure: AMPUTATION TOE;  Surgeon: Linus Galas, MD;  Location: [facility] ORS;  Service: Podiatry;  Laterality: Left;   AMPUTATION TOE Left 01/24/2016   Procedure: AMPUTATION TOE (2nd mpj);  Surgeon: Linus Galas, DPM;  Location: [facility] ORS;  Service: Podiatry;  Laterality: Left;   AMPUTATION TOE Right 12/21/2018   Procedure: 2ND TOE AMPUTATION WITH DEBRIDEMENT OF SOFT TISSUE;   Surgeon: Recardo Evangelist, DPM;  Location: [facility] ORS;  Service: Podiatry;  Laterality: Right;   AMPUTATION TOE Left 06/12/2022   Procedure: AMPUTATION TOE; PARTIAL AMPUTATION LEFT GREAT TOE;  Surgeon: Linus Galas, DPM;  Location: [facility] ORS;  Service: Podiatry;  Laterality: Left;   CARDIAC CATHETERIZATION N/A 11/12/2015   Procedure: Left Heart Cath and Coronary Angiography;  Surgeon: Marcina Millard, MD;  Location: [facility] INVASIVE CV LAB;  Service: Cardiovascular;  Laterality: N/A;   COLONOSCOPY WITH PROPOFOL N/A 07/20/2017   Procedure: COLONOSCOPY WITH PROPOFOL;  Surgeon: Wyline Mood, MD;  Location: [facility] ENDOSCOPY;  Service: Endoscopy;  Laterality: N/A;   JOINT REPLACEMENT     TOE AMPUTATION     VISCERAL ANGIOGRAPHY N/A 04/05/2023   Procedure: VISCERAL ANGIOGRAPHY;  Surgeon: Annice Needy, MD;  Location: [facility] INVASIVE CV LAB;  Service: Cardiovascular;  Laterality: N/A;    Allergies  Allergen Reactions   Amoxicillin Diarrhea   Ampicillin Nausea And Vomiting and Other (See Comments)    Did it involve swelling of the face/tongue/throat, SOB, or low BP? Yes Did it involve sudden or severe rash/hives, skin peeling, or any reaction on the inside of your mouth or nose? No Did you need to seek medical attention at a hospital or doctor's office? No When did it last happen? >10 years If all above answers are "NO", may proceed with cephalosporin use.    Augmentin [Amoxicillin-Pot Clavulanate] Diarrhea   Bactrim [Sulfamethoxazole-Trimethoprim] Other (See Comments)    N/V/D   Codeine  Nausea And Vomiting   Egg-Derived Products     ALLergic to cook or raw egg only. Food with egg ingredient is okay for patient.    Influenza Vaccines Other (See Comments)    Reaction:  Caused pt to pass out    Influenza Virus Vaccine Other (See Comments)    Reaction: Passed out for 12 days   Methadone Hives and Itching   Oxycodone-Acetaminophen Nausea And Vomiting   Percocet [Oxycodone-Acetaminophen] Nausea And Vomiting    Sulfa Antibiotics Other (See Comments)    Reaction: unknown   Tetanus Antitoxin Swelling and Other (See Comments)    Reaction: injection site swelling   Tetanus Toxoids Swelling and Other (See Comments)    Reaction:  Swelling at injection site   Penicillin G Rash    Has patient had a PCN reaction causing immediate rash, facial/tongue/throat swelling, SOB or lightheadedness with hypotension: Yes Has patient had a PCN reaction causing severe rash involving mucus membranes or skin necrosis: No Has patient had a PCN reaction that required hospitalization: No Has patient had a PCN reaction occurring within the last 10 years: No If all of the above answers are "NO", then may proceed with Cephalosporin use..   Tetracyclines & Related Rash    Prior to Admission medications   Medication Sig Start Date End Date Taking? Authorizing Provider  ACCU-CHEK GUIDE test strip TEST 3 TIMES DAILY AS DIRECTED 11/30/22   [provider]  acetaminophen (TYLENOL) 500 MG tablet Take 1,000 mg by mouth every 8 (eight) hours as needed for mild pain or headache.     [provider]  ALPRAZolam Prudy Feeler) 0.5 MG tablet Take 0.5 mg by mouth 4 (four) times daily as needed for anxiety. 10/16/22   [provider]  AMBULATORY NON FORMULARY MEDICATION Compounded estrogen  Apply 1 gram vaginally M,W, Friday [facility] 01/28/21   Vanna Scotland, MD  aspirin 81 MG tablet Take 81 mg by mouth daily.    [provider]  atorvastatin (LIPITOR) 20 MG tablet Take 20 mg by mouth daily.     [provider]  Blood Glucose Monitoring Suppl (ONE TOUCH ULTRA 2) w/Device KIT SMARTSIG:1 Via Meter Daily 01/23/23   [provider]  celecoxib (CELEBREX) 200 MG capsule Take 200 mg by mouth daily.    [provider]  clopidogrel (PLAVIX) 75 MG tablet Take 1 tablet (75 mg total) by mouth daily. Patient not taking: Reported on 05/05/2023 04/05/23   Annice Needy, MD  cyclobenzaprine  (FLEXERIL) 10 MG tablet Take 1 tablet by mouth 3 (three) times daily. 04/22/23   [provider]  insulin aspart (NOVOLOG) 100 UNIT/ML injection Inject 3-15 Units into the skin 3 (three) times daily with meals as needed for high blood sugar. Pt uses as needed per sliding scale:    Less than 140:  0 units  140-180:  3 units 181-220:  4 units 221- 260:  6 units 261- 320:  8 units 321-360:  10 units 361-400:  12 units Greater than 400:  15 units    [provider]  isosorbide mononitrate (IMDUR) 30 MG 24 hr tablet Take 30 mg by mouth daily. 11/11/20   [provider]  Lancets (ONETOUCH DELICA PLUS [place]) MISC Apply topically. 01/28/23   [provider]  lisinopril (ZESTRIL) 20 MG tablet Take 20 mg by mouth daily. 11/11/20   [provider]  metoprolol tartrate (LOPRESSOR) 25 MG tablet Take 1 tablet (25 mg total) by mouth 2 (two) times  daily. 11/17/20 04/06/23  Jerald Kief, MD  mirtazapine (REMERON) 15 MG tablet Take 15 mg by mouth at bedtime.    [provider]  morphine (MSIR) 15 MG tablet Take 15 mg by mouth 2 (two) times daily. 11/16/22   [provider]  Multiple Vitamins-Minerals (HAIR SKIN AND NAILS FORMULA PO) Take by mouth daily. 2 gummies daily    [provider]  omeprazole (PRILOSEC) 40 MG capsule Take 1 capsule (40 mg total) by mouth daily. 06/24/22   Darlin Priestly, MD  polyvinyl alcohol (LIQUIFILM TEARS) 1.4 % ophthalmic solution Place 1 drop into the right eye 4 (four) times daily. 11/18/22   Arnetha Courser, MD  TOUJEO SOLOSTAR 300 UNIT/ML SOPN Inject 60 Units into the skin every morning. 08/08/19   [provider]  traZODone (DESYREL) 100 MG tablet Take 0.5 tablets (50 mg total) by mouth at bedtime as needed for sleep. 04/07/23   Sunnie Nielsen, DO  venlafaxine XR (EFFEXOR-XR) 75 MG 24 hr capsule Take 75 mg by mouth 2 (two) times daily.    [provider]  Vitamin D, Ergocalciferol, (DRISDOL)  50000 units CAPS capsule Take 50,000 Units by mouth every Sunday.    [provider]    Social History   Socioeconomic History   Marital status: Widowed    Spouse name: Not on file   Number of children: Not on file   Years of education: Not on file   Highest education level: Not on file  Occupational History   Not on file  Tobacco Use   Smoking status: Never   Smokeless tobacco: Never  Vaping Use   Vaping status: Never Used  Substance and Sexual Activity   Alcohol use: No   Drug use: No   Sexual activity: Not Currently  Other Topics Concern   Not on file  Social History Narrative   Not on file   Social Determinants of Health   Financial Resource Strain: Low Risk  (04/27/2023)   Received from [facility], [facility]   Overall Financial Resource Strain (CARDIA)    Difficulty of Paying Living Expenses: Not hard at all  Food Insecurity: No Food Insecurity (04/27/2023)   Received [facility], [name]   Hunger Vital Sign    Worried About Running Out of Food in the Last Year: Never true    Ran Out of Food in the Last Year: Never true  Transportation Needs: No Transportation Needs (04/27/2023)   Received from [facility], [facility]   [facility] - Transportation    In the past 12 months, has lack of transportation kept you from medical appointments or from getting medications?: No    Lack of Transportation (Non-Medical): No  Physical Activity: Not on file  Stress: Not on file  Social Connections: Not on file  Intimate Partner Violence: Not At Risk (04/07/2023)   Humiliation, Afraid, Rape, and Kick questionnaire    Fear of Current or Ex-Partner: No    Emotionally Abused: No    Physically Abused: No    Sexually Abused: No     Family History  Problem Relation Age of Onset   CAD Mother    CAD Father     ROS: Otherwise negative unless mentioned in  HPI  Physical Examination  Vitals:   06/25/2023 1330 06/23/2023 1352  BP: (!) 222/110 (!) 198/112  Pulse: (!) 110 (!) 109  Resp: (!) 27 (!) 23  Temp:    SpO2: 92% 93%   Body mass index is 28.07 kg/m.  General:  WDWN in NAD Gait: Not observed HENT: WNL, normocephalic Pulmonary: normal non-labored breathing, without Rales, rhonchi,  wheezing Cardiac: regular but tchycardic, without  Murmurs, rubs or gallops; without carotid bruits Abdomen: Positive bowel sounds,  soft, tender to palpation with 10 out of 10 pain.  Nondistended, positive guarding. Skin: without rashes Vascular Exam/Pulses: Palpable pulses throughout all extremities. Extremities: without ischemic changes, without Gangrene , without cellulitis; without open wounds;  Musculoskeletal: no muscle wasting or atrophy  Neurologic: A&O X 3;  No focal weakness or paresthesias are detected; speech is fluent/normal Psychiatric:  The pt has Normal affect. Lymph:  Unremarkable  CBC    Component Value Date/Time   WBC 24.7 (H) 07/03/2023 1009   RBC 5.11 07/04/2023 1009   HGB 16.3 (H) 06/22/2023 1009   HGB 11.8 (L) 12/15/2014 0958   HCT 48.9 (H) 07/08/2023 1009   HCT 37.0 12/15/2014 0958   PLT 426 (H) 06/29/2023 1009   PLT 269 12/15/2014 0958   MCV 95.7 07/15/2023 1009   MCV 91 12/15/2014 0958   MCH 31.9 07/14/2023 1009   MCHC 33.3 06/22/2023 1009   RDW 12.5 06/26/2023 1009   RDW 17.0 (H) 12/15/2014 0958   LYMPHSABS 2.2 04/06/2023 1126   MONOABS 0.6 04/06/2023 1126   EOSABS 0.1 04/06/2023 1126   BASOSABS 0.1 04/06/2023 1126    BMET    Component Value Date/Time   NA 135 07/12/2023 1009   NA 142 12/15/2014 0958   K 3.6 06/26/2023 1009   K 4.0 12/15/2014 0958   CL 99 06/25/2023 1009   CL 109 (H) 12/15/2014 0958   CO2 21 (L) 06/23/2023 1009   CO2 26 12/15/2014 0958   GLUCOSE 282 (H) 07/15/2023 1009   GLUCOSE 181 (H) 12/15/2014 0958   BUN 21 07/08/2023 1009   BUN 19 (H) 12/15/2014 0958   CREATININE 0.76 07/20/2023  1009   CREATININE 0.67 12/15/2014 0958   CALCIUM 10.1 06/25/2023 1009   CALCIUM 8.4 (L) 12/15/2014 0958   GFRNONAA >60 07/16/2023 1009   GFRNONAA >60 12/15/2014 0958   GFRAA >60 03/27/2020 0126   GFRAA >60 12/15/2014 0958    COAGS: Lab Results  Component Value Date   INR 1.0 11/17/2022   INR 1.1 10/20/2019   INR 1.00 09/10/2018     Non-Invasive Vascular Imaging:   EXAM:07/17/2023 CTA ABDOMEN AND PELVIS WITHOUT AND WITH CONTRAST   TECHNIQUE: Multidetector CT imaging of the abdomen and pelvis was performed using the standard protocol during bolus administration of intravenous contrast. Multiplanar reconstructed images and MIPs were obtained and reviewed to evaluate the vascular anatomy.   RADIATION DOSE REDUCTION: This exam was performed according to the departmental dose-optimization program which includes automated exposure control, adjustment of the mA and/or kV according to patient size and/or use of iterative reconstruction technique.   CONTRAST:  OMNIPAQUE IOHEXOL 350 MG/ML SOLN   COMPARISON:  Chest CT-04/06/2023; abdominal CT-01/12/2023   FINDINGS: VASCULAR   Aorta: There is a moderate amount of mixed calcified and noncalcified atherosclerotic plaque throughout the normal caliber abdominal aorta, not resulting in a hemodynamically significant stenosis. No evidence of abdominal aortic dissection or perivascular stranding.   Celiac: There is a moderate amount of mixed calcified and noncalcified atherosclerotic plaque involving the origin of the celiac artery, approaching 50% luminal narrowing. Conventional branching pattern.   SMA: The patient has undergone interval stenting of the origin and proximal  aspect of the SMA. The stent appears occluded with early reconstitution of the SMA via hypertrophied collateral supply from the celiac and likely retrograde supply from the IMA.   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 There is a minimal amount of eccentric calcified plaque involving the origin of the bilateral renal arteries, left-greater-than-right, not definitely resulting in hemodynamically significant stenosis. No vessel irregularity to suggest FMD.   IMA: Remains patent, likely with retrograde collateral supply to the SMA distribution.   Inflow: There is a minimal amount of eccentric predominantly calcified atherosclerotic plaque involving the bilateral common iliac arteries, not resulting in hemodynamically significant stenosis. The bilateral external iliac arteries are tortuous but of normal caliber and without hemodynamically significant narrowing. The bilateral internal iliac arteries are diseased though patent and of normal caliber.   Proximal Outflow: There is a very minimal amount calcified and noncalcified atherosclerotic plaque involving the bilateral common femoral arteries, right-greater-than-left, not definitely resulting in hemodynamically significant stenosis. The imaged portions of the bilateral deep and superficial femoral arteries are of normal caliber and widely patent throughout their imaged courses.   Veins: The IVC and pelvic venous systems appear widely patent.   Review of the MIP images confirms the above findings.   _________________________________________________________   NON-VASCULAR   Lower chest: Limited visualization of the lower thorax demonstrates minimal dependent subpleural ground- atelectasis. There is a minimal amount of subsegmental atelectasis involving the caudal aspect of the lingula. No discrete focal airspace opacities.   Normal heart size. Coronary artery calcifications. Calcifications involving the mitral valve annulus. No pericardial effusion.   Hepatobiliary: Mild nodularity of the of the hepatic contour with partial recanalization of the periumbilical vein. No  discrete hepatic lesions. Postcholecystectomy. There is mild dilatation of the common bile duct measuring 0.8 cm in diameter (image 71, series 13), with associated mild intrahepatic biliary ductal dilatation, likely the sequela of biliary reservoir phenomena in the setting of postcholecystectomy state. No ascites.   Pancreas: The pancreas is largely fatty replaced, unchanged.   Spleen: Normal appearance of the spleen.   Adrenals/Urinary Tract: There is symmetric enhancement of the bilateral kidneys. No evidence of nephrolithiasis on this postcontrast examination. No discrete renal lesions. There is a minimal amount of likely age and body habitus related perinephric stranding. No urinary obstruction.   Normal appearance of the bilateral adrenal glands.   Normal appearance of the urinary bladder given degree of distention.   Stomach/Bowel: There is a moderate amount of liquid stool seen within the cecum and ascending colon. There is circumferential wall thickening involving the descending and sigmoid colon, not resulting in enteric obstruction. The appendix is not visualized, however there is no pericecal inflammatory change. No significant hiatal hernia. No pneumoperitoneum, pneumatosis or portal venous gas.   Lymphatic: No bulky retroperitoneal, mesenteric, pelvic or inguinal lymphadenopathy.   Reproductive: Post hysterectomy.  No discrete adnexal lesions.   Other: Well-healed midline lower abdominal/pelvic incision without hernia. Tiny mesenteric fat containing periumbilical hernia.   Musculoskeletal: No acute or aggressive osseous abnormalities. Severe multilevel lumbar spine DDD, worse at L3-L4, L4-L5 and L5-S1 with disc space height loss, endplate irregularity and sclerosis. Severe bilateral facet degenerative change is seen within the lower lumbar spine. Moderate to severe degenerative change of the bilateral hips with joint space loss, subchondral sclerosis  and osteophytosis, right-greater-than-left.   IMPRESSION: Vascular Impression:   1. Interval stenting of the origin and proximal aspect  of the SMA with occlusion of the stent and early reconstitution of the SMA via hypertrophied collateral supply from the celiac and IMA. 2. Moderate amount of mixed calcified and noncalcified atherosclerotic plaque involving the origin of the celiac artery approaches 50% luminal narrowing. 3. Aortic Atherosclerosis (ICD10-I70.0).  Statin:  Yes.   Beta Blocker:  Yes.   Aspirin:  Yes.   ACEI:  Yes.   ARB:  No. CCB use:  No Other antiplatelets/anticoagulants:  Yes.   Plavix 75 mg daily.    ASSESSMENT/PLAN: This is a 77 y.o. female who presents to [facility] emergency department with severe generalized abdominal pain. Pain worsening over the past two weeks. Upon workup the  CTA of the abdomen patient was noted to have SMA occlusion of prior stent placement. Noted diarrhea and 10/10 pain. Patients last lactic acid level was 3.6  PLAN: Vascular Surgery plans on taking the patient to the vascular lab this afternoon for angiogram with possible intervention and visceral thrombectomy. I discussed in detail with the patient the procedure, benefits, risks and complications. Patient verbalized her understanding and wishes to proceed. I answered all the patients questions today. Patient endorses being NPO for the past two days.   -I discussed in detail the plan with Dr Leverne Cower dew and he agrees with the plan.    Marcie [facility] 07/20/2023 2:12 PM

## 2023-07-19 NOTE — Assessment & Plan Note (Signed)
Suspected to be reactive in the setting of ischemia.  - CBC in a.m.

## 2023-07-19 NOTE — Consult Note (Signed)
Hospital Consult    Reason for Consult:  SMA Occlusion of prior stent.  Requesting Physician:  Dr. Sharman Cheek MD. MRN #:  [id number]  History of Present Illness: This is a 77 y.o. 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 Radiates upward toward the chest. Associated nausea vomiting diarrhea and chills. Reports that her diarrhea appeared bloody 1 episode but after that it has just been dark brown. No hematemesis. Rates pain as 10/10.   On exam this afternoon patient's uncomfortable in the stretcher in the emergency department.  Patient noted to have some rebound tenderness and overall severe pain on palpation.  Patient does have positive bowel sounds.  Patient's vitals all remained stable with elevated blood pressure most likely related to her pain.  No other complaints at this time.  Patient noted to have an elevated lactic acid at 3.6 today.  Past Medical History:  Diagnosis Date   Anxiety    Chronic back pain    Collagen vascular disease (HCC)    Coronary artery disease    a. s/p PCI/DES to dRCA & mLCx in 2014 with repeat LHC in 10/2015 showing patent stents   Diabetes mellitus without complication (HCC)    Hypertension    Low back pain    Myocardial infarction (HCC)    Osteomyelitis of toe (HCC) 01/23/2016   Stroke [facility])     Past Surgical History:  Procedure Laterality Date   AMPUTATION TOE Left 11/10/2015   Procedure: AMPUTATION TOE;  Surgeon: Linus Galas, MD;  Location: [facility] ORS;  Service: Podiatry;  Laterality: Left;   AMPUTATION TOE Left 01/24/2016   Procedure: AMPUTATION TOE (2nd mpj);  Surgeon: Linus Galas, DPM;  Location: [facility] ORS;  Service: Podiatry;  Laterality: Left;   AMPUTATION TOE Right 12/21/2018   Procedure: 2ND TOE AMPUTATION WITH DEBRIDEMENT OF SOFT TISSUE;   Surgeon: Recardo Evangelist, DPM;  Location: [facility] ORS;  Service: Podiatry;  Laterality: Right;   AMPUTATION TOE Left 06/12/2022   Procedure: AMPUTATION TOE; PARTIAL AMPUTATION LEFT GREAT TOE;  Surgeon: Linus Galas, DPM;  Location: [facility] ORS;  Service: Podiatry;  Laterality: Left;   CARDIAC CATHETERIZATION N/A 11/12/2015   Procedure: Left Heart Cath and Coronary Angiography;  Surgeon: Marcina Millard, MD;  Location: [facility] INVASIVE CV LAB;  Service: Cardiovascular;  Laterality: N/A;   COLONOSCOPY WITH PROPOFOL N/A 07/20/2017   Procedure: COLONOSCOPY WITH PROPOFOL;  Surgeon: Wyline Mood, MD;  Location: [facility] ENDOSCOPY;  Service: Endoscopy;  Laterality: N/A;   JOINT REPLACEMENT     TOE AMPUTATION     VISCERAL ANGIOGRAPHY N/A 04/05/2023   Procedure: VISCERAL ANGIOGRAPHY;  Surgeon: Annice Needy, MD;  Location: [facility] INVASIVE CV LAB;  Service: Cardiovascular;  Laterality: N/A;    Allergies  Allergen Reactions   Amoxicillin Diarrhea   Ampicillin Nausea And Vomiting and Other (See Comments)    Did it involve swelling of the face/tongue/throat, SOB, or low BP? Yes Did it involve sudden or severe rash/hives, skin peeling, or any reaction on the inside of your mouth or nose? No Did you need to seek medical attention at a hospital or doctor's office? No When did it last happen? >10 years If all above answers are "NO", may proceed with cephalosporin use.    Augmentin [Amoxicillin-Pot Clavulanate] Diarrhea   Bactrim [Sulfamethoxazole-Trimethoprim] Other (See Comments)    N/V/D   Codeine  Nausea And Vomiting   Egg-Derived Products     ALLergic to cook or raw egg only. Food with egg ingredient is okay for patient.    Influenza Vaccines Other (See Comments)    Reaction:  Caused pt to pass out    Influenza Virus Vaccine Other (See Comments)    Reaction: Passed out for 12 days   Methadone Hives and Itching   Oxycodone-Acetaminophen Nausea And Vomiting   Percocet [Oxycodone-Acetaminophen] Nausea And Vomiting    Sulfa Antibiotics Other (See Comments)    Reaction: unknown   Tetanus Antitoxin Swelling and Other (See Comments)    Reaction: injection site swelling   Tetanus Toxoids Swelling and Other (See Comments)    Reaction:  Swelling at injection site   Penicillin G Rash    Has patient had a PCN reaction causing immediate rash, facial/tongue/throat swelling, SOB or lightheadedness with hypotension: Yes Has patient had a PCN reaction causing severe rash involving mucus membranes or skin necrosis: No Has patient had a PCN reaction that required hospitalization: No Has patient had a PCN reaction occurring within the last 10 years: No If all of the above answers are "NO", then may proceed with Cephalosporin use..   Tetracyclines & Related Rash    Prior to Admission medications   Medication Sig Start Date End Date Taking? Authorizing Provider  ACCU-CHEK GUIDE test strip TEST 3 TIMES DAILY AS DIRECTED 11/30/22   [provider]  acetaminophen (TYLENOL) 500 MG tablet Take 1,000 mg by mouth every 8 (eight) hours as needed for mild pain or headache.     [provider]  ALPRAZolam Prudy Feeler) 0.5 MG tablet Take 0.5 mg by mouth 4 (four) times daily as needed for anxiety. 10/16/22   [provider]  AMBULATORY NON FORMULARY MEDICATION Compounded estrogen  Apply 1 gram vaginally M,W, Friday [facility] 01/28/21   Vanna Scotland, MD  aspirin 81 MG tablet Take 81 mg by mouth daily.    [provider]  atorvastatin (LIPITOR) 20 MG tablet Take 20 mg by mouth daily.     [provider]  Blood Glucose Monitoring Suppl (ONE TOUCH ULTRA 2) w/Device KIT SMARTSIG:1 Via Meter Daily 01/23/23   [provider]  celecoxib (CELEBREX) 200 MG capsule Take 200 mg by mouth daily.    [provider]  clopidogrel (PLAVIX) 75 MG tablet Take 1 tablet (75 mg total) by mouth daily. Patient not taking: Reported on 05/05/2023 04/05/23   Annice Needy, MD  cyclobenzaprine  (FLEXERIL) 10 MG tablet Take 1 tablet by mouth 3 (three) times daily. 04/22/23   [provider]  insulin aspart (NOVOLOG) 100 UNIT/ML injection Inject 3-15 Units into the skin 3 (three) times daily with meals as needed for high blood sugar. Pt uses as needed per sliding scale:    Less than 140:  0 units  140-180:  3 units 181-220:  4 units 221- 260:  6 units 261- 320:  8 units 321-360:  10 units 361-400:  12 units Greater than 400:  15 units    [provider]  isosorbide mononitrate (IMDUR) 30 MG 24 hr tablet Take 30 mg by mouth daily. 11/11/20   [provider]  Lancets (ONETOUCH DELICA PLUS [place]) MISC Apply topically. 01/28/23   [provider]  lisinopril (ZESTRIL) 20 MG tablet Take 20 mg by mouth daily. 11/11/20   [provider]  metoprolol tartrate (LOPRESSOR) 25 MG tablet Take 1 tablet (25 mg total) by mouth 2 (two) times  daily. 11/17/20 04/06/23  Jerald Kief, MD  mirtazapine (REMERON) 15 MG tablet Take 15 mg by mouth at bedtime.    [provider]  morphine (MSIR) 15 MG tablet Take 15 mg by mouth 2 (two) times daily. 11/16/22   [provider]  Multiple Vitamins-Minerals (HAIR SKIN AND NAILS FORMULA PO) Take by mouth daily. 2 gummies daily    [provider]  omeprazole (PRILOSEC) 40 MG capsule Take 1 capsule (40 mg total) by mouth daily. 06/24/22   Darlin Priestly, MD  polyvinyl alcohol (LIQUIFILM TEARS) 1.4 % ophthalmic solution Place 1 drop into the right eye 4 (four) times daily. 11/18/22   Arnetha Courser, MD  TOUJEO SOLOSTAR 300 UNIT/ML SOPN Inject 60 Units into the skin every morning. 08/08/19   [provider]  traZODone (DESYREL) 100 MG tablet Take 0.5 tablets (50 mg total) by mouth at bedtime as needed for sleep. 04/07/23   Sunnie Nielsen, DO  venlafaxine XR (EFFEXOR-XR) 75 MG 24 hr capsule Take 75 mg by mouth 2 (two) times daily.    [provider]  Vitamin D, Ergocalciferol, (DRISDOL)  50000 units CAPS capsule Take 50,000 Units by mouth every Sunday.    [provider]    Social History   Socioeconomic History   Marital status: Widowed    Spouse name: Not on file   Number of children: Not on file   Years of education: Not on file   Highest education level: Not on file  Occupational History   Not on file  Tobacco Use   Smoking status: Never   Smokeless tobacco: Never  Vaping Use   Vaping status: Never Used  Substance and Sexual Activity   Alcohol use: No   Drug use: No   Sexual activity: Not Currently  Other Topics Concern   Not on file  Social History Narrative   Not on file   Social Determinants of Health   Financial Resource Strain: Low Risk  (04/27/2023)   Received from [facility], [facility]   Overall Financial Resource Strain (CARDIA)    Difficulty of Paying Living Expenses: Not hard at all  Food Insecurity: No Food Insecurity (04/27/2023)   Received [facility], [name]   Hunger Vital Sign    Worried About Running Out of Food in the Last Year: Never true    Ran Out of Food in the Last Year: Never true  Transportation Needs: No Transportation Needs (04/27/2023)   Received from [facility], [facility]   [facility] - Transportation    In the past 12 months, has lack of transportation kept you from medical appointments or from getting medications?: No    Lack of Transportation (Non-Medical): No  Physical Activity: Not on file  Stress: Not on file  Social Connections: Not on file  Intimate Partner Violence: Not At Risk (04/07/2023)   Humiliation, Afraid, Rape, and Kick questionnaire    Fear of Current or Ex-Partner: No    Emotionally Abused: No    Physically Abused: No    Sexually Abused: No     Family History  Problem Relation Age of Onset   CAD Mother    CAD Father     ROS: Otherwise negative unless mentioned in  HPI  Physical Examination  Vitals:   06/25/2023 1330 06/23/2023 1352  BP: (!) 222/110 (!) 198/112  Pulse: (!) 110 (!) 109  Resp: (!) 27 (!) 23  Temp:    SpO2: 92% 93%   Body mass index is 28.07 kg/m.  General:  WDWN in NAD Gait: Not observed HENT: WNL, normocephalic Pulmonary: normal non-labored breathing, without Rales, rhonchi,  wheezing Cardiac: regular but tchycardic, without  Murmurs, rubs or gallops; without carotid bruits Abdomen: Positive bowel sounds,  soft, tender to palpation with 10 out of 10 pain.  Nondistended, positive guarding. Skin: without rashes Vascular Exam/Pulses: Palpable pulses throughout all extremities. Extremities: without ischemic changes, without Gangrene , without cellulitis; without open wounds;  Musculoskeletal: no muscle wasting or atrophy  Neurologic: A&O X 3;  No focal weakness or paresthesias are detected; speech is fluent/normal Psychiatric:  The pt has Normal affect. Lymph:  Unremarkable  CBC    Component Value Date/Time   WBC 24.7 (H) 07/03/2023 1009   RBC 5.11 07/04/2023 1009   HGB 16.3 (H) 06/22/2023 1009   HGB 11.8 (L) 12/15/2014 0958   HCT 48.9 (H) 07/08/2023 1009   HCT 37.0 12/15/2014 0958   PLT 426 (H) 06/29/2023 1009   PLT 269 12/15/2014 0958   MCV 95.7 07/15/2023 1009   MCV 91 12/15/2014 0958   MCH 31.9 07/14/2023 1009   MCHC 33.3 06/22/2023 1009   RDW 12.5 06/26/2023 1009   RDW 17.0 (H) 12/15/2014 0958   LYMPHSABS 2.2 04/06/2023 1126   MONOABS 0.6 04/06/2023 1126   EOSABS 0.1 04/06/2023 1126   BASOSABS 0.1 04/06/2023 1126    BMET    Component Value Date/Time   NA 135 07/12/2023 1009   NA 142 12/15/2014 0958   K 3.6 06/26/2023 1009   K 4.0 12/15/2014 0958   CL 99 06/25/2023 1009   CL 109 (H) 12/15/2014 0958   CO2 21 (L) 06/23/2023 1009   CO2 26 12/15/2014 0958   GLUCOSE 282 (H) 07/15/2023 1009   GLUCOSE 181 (H) 12/15/2014 0958   BUN 21 07/08/2023 1009   BUN 19 (H) 12/15/2014 0958   CREATININE 0.76 07/20/2023  1009   CREATININE 0.67 12/15/2014 0958   CALCIUM 10.1 06/25/2023 1009   CALCIUM 8.4 (L) 12/15/2014 0958   GFRNONAA >60 07/16/2023 1009   GFRNONAA >60 12/15/2014 0958   GFRAA >60 03/27/2020 0126   GFRAA >60 12/15/2014 0958    COAGS: Lab Results  Component Value Date   INR 1.0 11/17/2022   INR 1.1 10/20/2019   INR 1.00 09/10/2018     Non-Invasive Vascular Imaging:   EXAM:07/17/2023 CTA ABDOMEN AND PELVIS WITHOUT AND WITH CONTRAST   TECHNIQUE: Multidetector CT imaging of the abdomen and pelvis was performed using the standard protocol during bolus administration of intravenous contrast. Multiplanar reconstructed images and MIPs were obtained and reviewed to evaluate the vascular anatomy.   RADIATION DOSE REDUCTION: This exam was performed according to the departmental dose-optimization program which includes automated exposure control, adjustment of the mA and/or kV according to patient size and/or use of iterative reconstruction technique.   CONTRAST:  OMNIPAQUE IOHEXOL 350 MG/ML SOLN   COMPARISON:  Chest CT-04/06/2023; abdominal CT-01/12/2023   FINDINGS: VASCULAR   Aorta: There is a moderate amount of mixed calcified and noncalcified atherosclerotic plaque throughout the normal caliber abdominal aorta, not resulting in a hemodynamically significant stenosis. No evidence of abdominal aortic dissection or perivascular stranding.   Celiac: There is a moderate amount of mixed calcified and noncalcified atherosclerotic plaque involving the origin of the celiac artery, approaching 50% luminal narrowing. Conventional branching pattern.   SMA: The patient has undergone interval stenting of the origin and proximal  aspect of the SMA. The stent appears occluded with early reconstitution of the SMA via hypertrophied collateral supply from the celiac and likely retrograde supply from the IMA.   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 There is a minimal amount of eccentric calcified plaque involving the origin of the bilateral renal arteries, left-greater-than-right, not definitely resulting in hemodynamically significant stenosis. No vessel irregularity to suggest FMD.   IMA: Remains patent, likely with retrograde collateral supply to the SMA distribution.   Inflow: There is a minimal amount of eccentric predominantly calcified atherosclerotic plaque involving the bilateral common iliac arteries, not resulting in hemodynamically significant stenosis. The bilateral external iliac arteries are tortuous but of normal caliber and without hemodynamically significant narrowing. The bilateral internal iliac arteries are diseased though patent and of normal caliber.   Proximal Outflow: There is a very minimal amount calcified and noncalcified atherosclerotic plaque involving the bilateral common femoral arteries, right-greater-than-left, not definitely resulting in hemodynamically significant stenosis. The imaged portions of the bilateral deep and superficial femoral arteries are of normal caliber and widely patent throughout their imaged courses.   Veins: The IVC and pelvic venous systems appear widely patent.   Review of the MIP images confirms the above findings.   _________________________________________________________   NON-VASCULAR   Lower chest: Limited visualization of the lower thorax demonstrates minimal dependent subpleural ground- atelectasis. There is a minimal amount of subsegmental atelectasis involving the caudal aspect of the lingula. No discrete focal airspace opacities.   Normal heart size. Coronary artery calcifications. Calcifications involving the mitral valve annulus. No pericardial effusion.   Hepatobiliary: Mild nodularity of the of the hepatic contour with partial recanalization of the periumbilical vein. No  discrete hepatic lesions. Postcholecystectomy. There is mild dilatation of the common bile duct measuring 0.8 cm in diameter (image 71, series 13), with associated mild intrahepatic biliary ductal dilatation, likely the sequela of biliary reservoir phenomena in the setting of postcholecystectomy state. No ascites.   Pancreas: The pancreas is largely fatty replaced, unchanged.   Spleen: Normal appearance of the spleen.   Adrenals/Urinary Tract: There is symmetric enhancement of the bilateral kidneys. No evidence of nephrolithiasis on this postcontrast examination. No discrete renal lesions. There is a minimal amount of likely age and body habitus related perinephric stranding. No urinary obstruction.   Normal appearance of the bilateral adrenal glands.   Normal appearance of the urinary bladder given degree of distention.   Stomach/Bowel: There is a moderate amount of liquid stool seen within the cecum and ascending colon. There is circumferential wall thickening involving the descending and sigmoid colon, not resulting in enteric obstruction. The appendix is not visualized, however there is no pericecal inflammatory change. No significant hiatal hernia. No pneumoperitoneum, pneumatosis or portal venous gas.   Lymphatic: No bulky retroperitoneal, mesenteric, pelvic or inguinal lymphadenopathy.   Reproductive: Post hysterectomy.  No discrete adnexal lesions.   Other: Well-healed midline lower abdominal/pelvic incision without hernia. Tiny mesenteric fat containing periumbilical hernia.   Musculoskeletal: No acute or aggressive osseous abnormalities. Severe multilevel lumbar spine DDD, worse at L3-L4, L4-L5 and L5-S1 with disc space height loss, endplate irregularity and sclerosis. Severe bilateral facet degenerative change is seen within the lower lumbar spine. Moderate to severe degenerative change of the bilateral hips with joint space loss, subchondral sclerosis  and osteophytosis, right-greater-than-left.   IMPRESSION: Vascular Impression:   1. Interval stenting of the origin and proximal aspect  of the SMA with occlusion of the stent and early reconstitution of the SMA via hypertrophied collateral supply from the celiac and IMA. 2. Moderate amount of mixed calcified and noncalcified atherosclerotic plaque involving the origin of the celiac artery approaches 50% luminal narrowing. 3. Aortic Atherosclerosis (ICD10-I70.0).  Statin:  Yes.   Beta Blocker:  Yes.   Aspirin:  Yes.   ACEI:  Yes.   ARB:  No. CCB use:  No Other antiplatelets/anticoagulants:  Yes.   Plavix 75 mg daily.    ASSESSMENT/PLAN: This is a 77 y.o. female who presents to [facility] emergency department with severe generalized abdominal pain. Pain worsening over the past two weeks. Upon workup the  CTA of the abdomen patient was noted to have SMA occlusion of prior stent placement. Noted diarrhea and 10/10 pain. Patients last lactic acid level was 3.6  PLAN: Vascular Surgery plans on taking the patient to the vascular lab this afternoon for angiogram with possible intervention and visceral thrombectomy. I discussed in detail with the patient the procedure, benefits, risks and complications. Patient verbalized her understanding and wishes to proceed. I answered all the patients questions today. Patient endorses being NPO for the past two days.   -I discussed in detail the plan with Dr Leverne Cower dew and he agrees with the plan.    Marcie Bal [facility] 07/20/2023 2:12 PM

## 2023-07-19 NOTE — Assessment & Plan Note (Signed)
-   Continue home Lyrica and morphine.  PDMP reviewed and appropriate

## 2023-07-19 NOTE — Assessment & Plan Note (Addendum)
Patient is presenting with excruciating abdominal pain, hematochezia followed by melena, and evidence of SMA occlusion seen on CTA.  Lactic acid is elevated.  - Vascular surgery consulted; appreciate their recommendations - Heparin infusion per pharmacy dosing - N.p.o. - Plan for angiography today - Start empiric antibiotics - Dilaudid as needed for pain control - Continue to trend lactic acid

## 2023-07-19 NOTE — ED Provider Notes (Signed)
[facility] Provider Note    Event Date/Time   First MD Initiated Contact with Patient 07/17/2023 1051     (approximate)   History   Chief Complaint: Abdominal Pain   HPI  Jessica Donovan is a 77 y.o. 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  Radiates upward toward the chest.  Associated nausea vomiting diarrhea and chills.  Reports that her diarrhea appeared bloody 1 episode but after that it has just been dark brown.  No hematemesis.  Rates pain as 10/10.     Physical Exam   Triage Vital Signs: ED Triage Vitals  Encounter Vitals Group     BP 07/08/2023 1006 (!) 169/106     Systolic BP Percentile --      Diastolic BP Percentile --      Pulse Rate 07/20/2023 1006 (!) 118     Resp 07/08/2023 1006 20     Temp 07/09/2023 1006 97.9 F (36.6 C)     Temp Source 06/23/2023 1006 Oral     SpO2 07/01/2023 1006 97 %     Weight 07/06/2023 1007 179 lb 3.7 oz (81.3 kg)     Height 06/21/2023 1007 5\' 7"  (1.702 m)     Head Circumference --      Peak Flow --      Pain Score 07/04/2023 1006 10     Pain Loc --      Pain Education --      Exclude from Growth Chart --     Most recent vital signs: Vitals:   07/13/2023 1352 06/30/2023 1400  BP: (!) 198/112 (!) 204/115  Pulse: (!) 109 (!) 107  Resp: (!) 23 (!) 22  Temp:    SpO2: 93% 92%    General: Awake, no distress.  CV:  Good peripheral perfusion.  Tachycardia heart rate 120.  Normal distal pulses Resp:  Normal effort.  Clear to auscultation bilaterally Abd:  No distention.  Soft with generalized tenderness.  No peritonitis Other:  Dry oral mucosa.  No lower extremity tenderness.  Symmetric calf circumference.   ED Results / Procedures / Treatments   Labs (all labs ordered are listed, but only abnormal results are  displayed) Labs Reviewed  COMPREHENSIVE METABOLIC PANEL - Abnormal; Notable for the following components:      Result Value   CO2 21 (*)    Glucose, Bld 282 (*)    All other components within normal limits  CBC - Abnormal; Notable for the following components:   WBC 24.7 (*)    Hemoglobin 16.3 (*)    HCT 48.9 (*)    Platelets 426 (*)    All other components within normal limits  LACTIC ACID, PLASMA - Abnormal; Notable for the following components:   Lactic Acid, Venous 3.6 (*)    All other components within normal limits  SARS CORONAVIRUS 2 BY RT PCR  LIPASE, BLOOD  APTT  PROTIME-INR  URINALYSIS, ROUTINE W REFLEX MICROSCOPIC  HEPARIN LEVEL (UNFRACTIONATED)     EKG Interpreted by me Sinus tachycardia rate 118.  Left axis, normal intervals.  Normal QRS ST segments and T waves.   RADIOLOGY CTA abd interpreted by me, shows occlusion of prox. SMA. Radiology report reviewed   PROCEDURES:  .Critical Care  Performed by: Sharman Cheek, MD Authorized by: Sharman Cheek, MD  Critical care provider statement:    Critical care time (minutes):  35   Critical care time was exclusive of:  Separately billable procedures and treating other patients   Critical care was necessary to treat or prevent imminent or life-threatening deterioration of the following conditions:  Respiratory failure, shock and circulatory failure   Critical care was time spent personally by me on the following activities:  Development of treatment plan with patient or surrogate, discussions with consultants, evaluation of patient's response to treatment, examination of patient, obtaining history from patient or surrogate, ordering and performing treatments and interventions, ordering and review of laboratory studies, ordering and review of radiographic studies, pulse oximetry, re-evaluation of patient's condition and review of old charts   Care discussed with: admitting provider      MEDICATIONS ORDERED  IN ED: Medications  heparin ADULT infusion 100 units/mL (25000 units/250mL) (1,250 Units/hr Intravenous New Bag/Given 06/25/2023 1417)  cefTRIAXone (ROCEPHIN) 2 g in sodium chloride 0.9 % 100 mL IVPB (2 g Intravenous New Bag/Given 06/27/2023 1450)  metroNIDAZOLE (FLAGYL) IVPB 500 mg (has no administration in time range)  sodium chloride 0.9 % bolus 1,000 mL (0 mLs Intravenous Stopped 07/07/2023 1254)  ondansetron (ZOFRAN) injection 4 mg (4 mg Intravenous Given 06/23/2023 1116)  morphine (PF) 4 MG/ML injection 4 mg (4 mg Intravenous Given 07/20/2023 1115)  iohexol (OMNIPAQUE) 350 MG/ML injection 100 mL (100 mLs Intravenous Contrast Given 07/03/2023 1151)  HYDROmorphone (DILAUDID) injection 1 mg (1 mg Intravenous Given 07/18/2023 1257)  metoCLOPramide (REGLAN) injection 10 mg (10 mg Intravenous Given 07/03/2023 1303)  heparin bolus via infusion 4,000 Units (4,000 Units Intravenous Bolus from Bag 07/20/2023 1417)     IMPRESSION / MDM / ASSESSMENT AND PLAN / ED COURSE  I reviewed the triage vital signs and the nursing notes.  DDx: Mesenteric ischemia, diverticulitis, colitis, GI perforation, intra-abdominal abscess, pancreatitis, appendicitis  Patient's presentation is most consistent with acute presentation with potential threat to life or bodily function.  Patient presents with gradually worsening generalized abdominal pain which is severe.  Now associated with a few days of vomiting and diarrhea.  Appears dehydrated.  Will give IV fluids, morphine, Zofran while checking labs and CT angiogram of the abdomen.   Clinical Course as of 06/28/2023 1517  Mon Jul 19, 2023  1228 Hemoglobin(!): 16.3 [PS]  1401 CTA concerning for occluded SMA stent and mesenteric ischemia. D/w vasc. Surg who will eval. [PS]    Clinical Course User Index [PS] Sharman Cheek, MD     FINAL CLINICAL IMPRESSION(S) / ED DIAGNOSES   Final diagnoses:  Mesenteric ischemia (HCC)     Rx / DC Orders   ED Discharge Orders     None         Note:  This document was prepared using Dragon voice recognition software and may include unintentional dictation errors.   Sharman Cheek, MD 06/22/2023 [phone]

## 2023-07-19 NOTE — Assessment & Plan Note (Signed)
-   Continue home Xanax, venlafaxine, mirtazapine.  PDMP reviewed and appropriate

## 2023-07-19 NOTE — Interval H&P Note (Signed)
History and Physical Interval Note:  06/26/2023 4:48 PM  Jessica Donovan  has presented today for surgery, with the diagnosis of ABDOMINAL PAIN.  The various methods of treatment have been discussed with the patient and family. After consideration of risks, benefits and other options for treatment, the patient has consented to  Procedure(s): VISCERAL ANGIOGRAPHY (N/A) as a surgical intervention.  The patient's history has been reviewed, patient examined, no change in status, stable for surgery.  I have reviewed the patient's chart and labs.  Questions were answered to the patient's satisfaction.     Festus Barren

## 2023-07-19 NOTE — H&P (Signed)
History and Physical    Patient: Jessica Donovan ZOX:[id number] DOB: 1946-08-21 DOA: 07/01/2023 DOS: the patient was seen and examined on 07/05/2023 PCP: Mick Sell, MD  Patient coming from: Home  Chief Complaint:  Chief Complaint  Patient presents with   Abdominal Pain   HPI: Jessica Donovan is a 77 y.o. 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  Jessica Donovan states that she has been experiencing severe generalized abdominal pain that has gradually worsened especially in the last 1 week.  In addition, over the last 1 week, she has been having nausea with nonbloody, nonmelanotic emesis.  This week, she also noticed new bright red blood per rectum that cleared after 1 or 2 days and became brown.  However in the last day or 2, she notes that her stool has been black.  She endorses frequent loose stools since abdominal pain started.  She denies any shortness of breath or palpitations.  She endorses some chest pain that radiates down into her abdomen that worsens with palpation of the epigastric region.  She denies any fevers or chills.  She has been taking her aspirin as instructed but is unsure about her Plavix.  ED course: On arrival to the ED, patient was hypertensive at 197/108 with heart rate of 114.  She was saturating at 95% on room air.  She was tachypneic up to 31/minute.  She was afebrile at 97.9. Initial workup demonstrates WBC of 24.7, hemoglobin of 16.3, platelets of 426, bicarb of 21, glucose of 282, and creatinine of 0.76 with GFR above 60.  Lactic acid elevated at 3.6.  INR within normal limits.  COVID-19 negative.  CTA was obtained that demonstrated occlusion of the SMA stent with early reconstitution via hypertrophied collaterals.  In addition, there was circumferential wall thickening involving the  descending and sigmoid colon concerning for possible early ischemic changes.  Vascular surgery consulted with plans for intervention.  [facility] contacted for admission.  Review of Systems: As mentioned in the history of present illness. All other systems reviewed and are negative.  Past Medical History:  Diagnosis Date   Anxiety    Chronic back pain    Collagen vascular disease (HCC)    Coronary artery disease    a. s/p PCI/DES to dRCA & mLCx in 2014 with repeat LHC in 10/2015 showing patent stents   Diabetes mellitus without complication (HCC)    Hypertension    Low back pain    Myocardial infarction (HCC)    Osteomyelitis of toe (HCC) 01/23/2016   Stroke [facility])    Past Surgical History:  Procedure Laterality Date   AMPUTATION TOE Left 11/10/2015   Procedure: AMPUTATION TOE;  Surgeon: Linus Galas, MD;  Location: [facility] ORS;  Service: Podiatry;  Laterality: Left;   AMPUTATION TOE Left 01/24/2016   Procedure: AMPUTATION TOE (2nd mpj);  Surgeon: Linus Galas, DPM;  Location: [facility] ORS;  Service: Podiatry;  Laterality: Left;   AMPUTATION TOE Right 12/21/2018   Procedure: 2ND TOE AMPUTATION WITH DEBRIDEMENT OF SOFT TISSUE;  Surgeon: Recardo Evangelist, DPM;  Location: [facility] ORS;  Service: Podiatry;  Laterality: Right;   AMPUTATION TOE Left 06/12/2022   Procedure: AMPUTATION TOE; PARTIAL AMPUTATION LEFT GREAT TOE;  Surgeon: Linus Galas, DPM;  Location: [facility] ORS;  Service: Podiatry;  Laterality: Left;   CARDIAC CATHETERIZATION N/A 11/12/2015  Procedure: Left Heart Cath and Coronary Angiography;  Surgeon: Marcina Millard, MD;  Location: [facility] INVASIVE CV LAB;  Service: Cardiovascular;  Laterality: N/A;   COLONOSCOPY WITH PROPOFOL N/A 07/20/2017   Procedure: COLONOSCOPY WITH PROPOFOL;  Surgeon: Wyline Mood, MD;  Location: [facility] ENDOSCOPY;  Service: Endoscopy;  Laterality: N/A;   JOINT REPLACEMENT     TOE AMPUTATION     VISCERAL ANGIOGRAPHY N/A 04/05/2023   Procedure: VISCERAL ANGIOGRAPHY;  Surgeon: Annice Needy, MD;  Location: [facility] INVASIVE CV LAB;  Service: Cardiovascular;  Laterality: N/A;   Social History:  reports that she has never smoked. She has never used smokeless tobacco. She reports that she does not drink alcohol and does not use drugs.  Allergies  Allergen Reactions   Amoxicillin Diarrhea   Ampicillin Nausea And Vomiting and Other (See Comments)    Did it involve swelling of the face/tongue/throat, SOB, or low BP? Yes Did it involve sudden or severe rash/hives, skin peeling, or any reaction on the inside of your mouth or nose? No Did you need to seek medical attention at a hospital or doctor's office? No When did it last happen? >10 years If all above answers are "NO", may proceed with cephalosporin use.    Augmentin [Amoxicillin-Pot Clavulanate] Diarrhea   Bactrim [Sulfamethoxazole-Trimethoprim] Other (See Comments)    N/V/D   Codeine Nausea And Vomiting   Egg-Derived Products     ALLergic to cook or raw egg only. Food with egg ingredient is okay for patient.    Influenza Vaccines Other (See Comments)    Reaction:  Caused pt to pass out    Influenza Virus Vaccine Other (See Comments)    Reaction: Passed out for 12 days   Methadone Hives and Itching   Oxycodone-Acetaminophen Nausea And Vomiting   Percocet [Oxycodone-Acetaminophen] Nausea And Vomiting   Sulfa Antibiotics Other (See Comments)    Reaction: unknown   Tetanus Antitoxin Swelling and Other (See Comments)    Reaction: injection site swelling   Tetanus Toxoids Swelling and Other (See Comments)    Reaction:  Swelling at injection site   Penicillin G Rash    Has patient had a PCN reaction causing immediate rash, facial/tongue/throat swelling, SOB or lightheadedness with hypotension: Yes Has patient had a PCN reaction causing severe rash involving mucus membranes or skin necrosis: No Has patient had a PCN reaction that required hospitalization: No Has patient had a PCN reaction occurring within the last 10 years:  No If all of the above answers are "NO", then may proceed with Cephalosporin use..   Tetracyclines & Related Rash    Family History  Problem Relation Age of Onset   CAD Mother    CAD Father     Prior to Admission medications   Medication Sig Start Date End Date Taking? Authorizing Provider  ACCU-CHEK GUIDE test strip TEST 3 TIMES DAILY AS DIRECTED 11/30/22   [provider]  acetaminophen (TYLENOL) 500 MG tablet Take 1,000 mg by mouth every 8 (eight) hours as needed for mild pain or headache.     [provider]  ALPRAZolam Prudy Feeler) 0.5 MG tablet Take 0.5 mg by mouth 4 (four) times daily as needed for anxiety. 10/16/22   [provider]  AMBULATORY NON FORMULARY MEDICATION Compounded estrogen  Apply 1 gram vaginally M,W, [facility] 01/28/21   Vanna Scotland, MD  aspirin 81 MG tablet Take 81 mg by mouth daily.    [provider]  atorvastatin (LIPITOR) 20 MG tablet Take  20 mg by mouth daily.     [provider]  Blood Glucose Monitoring Suppl (ONE TOUCH ULTRA 2) w/Device KIT SMARTSIG:1 Via Meter Daily 01/23/23   [provider]  celecoxib (CELEBREX) 200 MG capsule Take 200 mg by mouth daily.    [provider]  clopidogrel (PLAVIX) 75 MG tablet Take 1 tablet (75 mg total) by mouth daily. Patient not taking: Reported on 05/05/2023 04/05/23   Annice Needy, MD  cyclobenzaprine (FLEXERIL) 10 MG tablet Take 1 tablet by mouth 3 (three) times daily. 04/22/23   [provider]  insulin aspart (NOVOLOG) 100 UNIT/ML injection Inject 3-15 Units into the skin 3 (three) times daily with meals as needed for high blood sugar. Pt uses as needed per sliding scale:    Less than 140:  0 units  140-180:  3 units 181-220:  4 units 221- 260:  6 units 261- 320:  8 units 321-360:  10 units 361-400:  12 units Greater than 400:  15 units    [provider]  isosorbide mononitrate (IMDUR) 30 MG 24 hr tablet Take 30 mg by  mouth daily. 11/11/20   [provider]  Lancets (ONETOUCH DELICA PLUS Grill) MISC Apply topically. 01/28/23   [provider]  lisinopril (ZESTRIL) 20 MG tablet Take 20 mg by mouth daily. 11/11/20   [provider]  metoprolol tartrate (LOPRESSOR) 25 MG tablet Take 1 tablet (25 mg total) by mouth 2 (two) times daily. 11/17/20 04/06/23  Jerald Kief, MD  mirtazapine (REMERON) 15 MG tablet Take 15 mg by mouth at bedtime.    [provider]  morphine (MSIR) 15 MG tablet Take 15 mg by mouth 2 (two) times daily. 11/16/22   [provider]  Multiple Vitamins-Minerals (HAIR SKIN AND NAILS FORMULA PO) Take by mouth daily. 2 gummies daily    [provider]  omeprazole (PRILOSEC) 40 MG capsule Take 1 capsule (40 mg total) by mouth daily. 06/24/22   Darlin Priestly, MD  polyvinyl alcohol (LIQUIFILM TEARS) 1.4 % ophthalmic solution Place 1 drop into the right eye 4 (four) times daily. 11/18/22   Arnetha Courser, MD  TOUJEO SOLOSTAR 300 UNIT/ML SOPN Inject 60 Units into the skin every morning. 08/08/19   [provider]  traZODone (DESYREL) 100 MG tablet Take 0.5 tablets (50 mg total) by mouth at bedtime as needed for sleep. 04/07/23   Sunnie Nielsen, DO  venlafaxine XR (EFFEXOR-XR) 75 MG 24 hr capsule Take 75 mg by mouth 2 (two) times daily.    [provider]  Vitamin D, Ergocalciferol, (DRISDOL) 50000 units CAPS capsule Take 50,000 Units by mouth every Sunday.    [provider]    Physical Exam: Vitals:   06/22/2023 1400 06/23/2023 1430 07/04/2023 1500 07/03/2023 1541  BP: (!) 204/115 (!) 214/100 (!) 199/104 (!) 208/103  Pulse: (!) 107 (!) 107 (!) 107 (!) 108  Resp: (!) 22 15 19 13   Temp:    98 F (36.7 C)  TempSrc:    Oral  SpO2: 92% 91% 93% 96%  Weight:    78.9 kg  Height:    5\' 7"  (1.702 m)   Physical Exam Vitals reviewed.  Constitutional:      General: She is in acute distress (2/2 pain).     Appearance: She is normal  weight. She is ill-appearing.  HENT:     Head: Normocephalic and atraumatic.     Mouth/Throat:     Mouth: Mucous membranes are moist.  Pharynx: Oropharynx is clear.  Eyes:     Extraocular Movements: Extraocular movements intact.     Pupils: Pupils are equal, round, and reactive to light.  Cardiovascular:     Rate and Rhythm: Regular rhythm. Tachycardia present.     Heart sounds: No murmur heard. Pulmonary:     Effort: Pulmonary effort is normal. Tachypnea present. No respiratory distress.     Breath sounds: Normal breath sounds. No wheezing, rhonchi or rales.  Abdominal:     General: Bowel sounds are decreased. There is distension (Mild).     Palpations: Abdomen is soft.     Tenderness: There is generalized abdominal tenderness. There is guarding (Mild).     Hernia: No hernia is present.  Musculoskeletal:     Right lower leg: No edema.     Left lower leg: No edema.  Skin:    General: Skin is warm and dry.  Neurological:     General: No focal deficit present.     Mental Status: She is alert and oriented to person, place, and time.  Psychiatric:        Mood and Affect: Mood normal.        Behavior: Behavior normal.    Data Reviewed: CBC with WBC 24.7, hemoglobin of 16.3, platelets of 426 CMP with sodium 135, potassium 3.6, bicarb 21, glucose 282, BUN 21, creatinine 0.76 with GFR above 60. Lactic acid 3.6 INR 1.1 PTT 26  EKG personally reviewed.  Sinus rhythm with rate of 118.  J-point elevation is nonspecific.  No other ST or T wave changes concerning for acute ischemia.  CT Angio Abd/Pel W and/or Wo Contrast  Result Date: 07/08/2023 CLINICAL DATA:  Acute mesenteric ischemia. EXAM: CTA ABDOMEN AND PELVIS WITHOUT AND WITH CONTRAST TECHNIQUE: Multidetector CT imaging of the abdomen and pelvis was performed using the standard protocol during bolus administration of intravenous contrast. Multiplanar reconstructed images and MIPs were obtained and reviewed to evaluate the  vascular anatomy. RADIATION DOSE REDUCTION: This exam was performed according to the departmental dose-optimization program which includes automated exposure control, adjustment of the mA and/or kV according to patient size and/or use of iterative reconstruction technique. CONTRAST:  OMNIPAQUE IOHEXOL 350 MG/ML SOLN COMPARISON:  Chest CT-04/06/2023; abdominal CT-01/12/2023 FINDINGS: VASCULAR Aorta: There is a moderate amount of mixed calcified and noncalcified atherosclerotic plaque throughout the normal caliber abdominal aorta, not resulting in a hemodynamically significant stenosis. No evidence of abdominal aortic dissection or perivascular stranding. Celiac: There is a moderate amount of mixed calcified and noncalcified atherosclerotic plaque involving the origin of the celiac artery, approaching 50% luminal narrowing. Conventional branching pattern. SMA: The patient has undergone interval stenting of the origin and proximal aspect of the SMA. The stent appears occluded with early reconstitution of the SMA via hypertrophied collateral supply from the celiac and likely retrograde supply from the IMA. 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 There is a minimal amount of eccentric calcified plaque involving the origin of the bilateral renal arteries, left-greater-than-right, not definitely resulting in hemodynamically significant stenosis. No vessel irregularity to suggest FMD. IMA: Remains patent, likely with retrograde collateral supply to the SMA distribution. Inflow: There is a minimal amount of eccentric predominantly calcified atherosclerotic plaque involving the bilateral common iliac arteries, not resulting in hemodynamically significant stenosis. The bilateral external iliac arteries are tortuous but of normal caliber and without hemodynamically significant narrowing. The bilateral internal iliac arteries  are diseased though patent and of normal caliber. Proximal Outflow: There is a very minimal amount calcified and noncalcified atherosclerotic plaque involving the bilateral common femoral arteries, right-greater-than-left, not definitely resulting in hemodynamically significant stenosis. The imaged portions of the bilateral deep and superficial femoral arteries are of normal caliber and widely patent throughout their imaged courses. Veins: The IVC and pelvic venous systems appear widely patent. Review of the MIP images confirms the above findings. _________________________________________________________ NON-VASCULAR Lower chest: Limited visualization of the lower thorax demonstrates minimal dependent subpleural ground-[place] atelectasis. There is a minimal amount of subsegmental atelectasis involving the caudal aspect of the lingula. No discrete focal airspace opacities. Normal heart size. Coronary artery calcifications. Calcifications involving the mitral valve annulus. No pericardial effusion. Hepatobiliary: Mild nodularity of the of the hepatic contour with partial recanalization of the periumbilical vein. No discrete hepatic lesions. Postcholecystectomy. There is mild dilatation of the common bile duct measuring 0.8 cm in diameter (image 71, series 13), with associated mild intrahepatic biliary ductal dilatation, likely the sequela of biliary reservoir phenomena in the setting of postcholecystectomy state. No ascites. Pancreas: The pancreas is largely fatty replaced, unchanged. Spleen: Normal appearance of the spleen. Adrenals/Urinary Tract: There is symmetric enhancement of the bilateral kidneys. No evidence of nephrolithiasis on this postcontrast examination. No discrete renal lesions. There is a minimal amount of likely age and body habitus related perinephric stranding. No urinary obstruction. Normal appearance of the bilateral adrenal glands. Normal appearance of the urinary bladder given degree of  distention. Stomach/Bowel: There is a moderate amount of liquid stool seen within the cecum and ascending colon. There is circumferential wall thickening involving the descending and sigmoid colon, not resulting in enteric obstruction. The appendix is not visualized, however there is no pericecal inflammatory change. No significant hiatal hernia. No pneumoperitoneum, pneumatosis or portal venous gas. Lymphatic: No bulky retroperitoneal, mesenteric, pelvic or inguinal lymphadenopathy. Reproductive: Post hysterectomy.  No discrete adnexal lesions. Other: Well-healed midline lower abdominal/pelvic incision without hernia. Tiny mesenteric fat containing periumbilical hernia. Musculoskeletal: No acute or aggressive osseous abnormalities. Severe multilevel lumbar spine DDD, worse at L3-L4, L4-L5 and L5-S1 with disc space height loss, endplate irregularity and sclerosis. Severe bilateral facet degenerative change is seen within the lower lumbar spine. Moderate to severe degenerative change of the bilateral hips with joint space loss, subchondral sclerosis and osteophytosis, right-greater-than-left. IMPRESSION: Vascular Impression: 1. Interval stenting of the origin and proximal aspect of the SMA with occlusion of the stent and early reconstitution of the SMA via hypertrophied collateral supply from the celiac and IMA. 2. Moderate amount of mixed calcified and noncalcified atherosclerotic plaque involving the origin of the celiac artery approaches 50% luminal narrowing. 3. Aortic Atherosclerosis (ICD10-I70.0). Nonvascular Impression: 1. Circumferential wall thickening involving the descending and sigmoid colon without evidence of pneumatosis or portal venous gas, nonspecific though given SMA stent occlusion, early ischemic changes are not excluded on the basis of this examination. Correlation with lactate levels could be performed as indicated. 2. Mild nodularity of the hepatic contour with partial recanalization of the  periumbilical vein, nonspecific though could be seen in the setting of early cirrhosis. Correlation with LFTs is advised. Electronically Signed   By: Simonne Come M.D.   On: 07/01/2023 13:06    Results are pending, will review when available.  Assessment and Plan:  Acute mesenteric ischemia (HCC) Patient is presenting with excruciating abdominal pain, hematochezia followed by melena, and evidence of SMA occlusion seen on CTA.  Lactic acid is elevated.  - Vascular  surgery consulted; appreciate their recommendations - Heparin infusion per pharmacy dosing - N.p.o. - Plan for angiography today - Start empiric antibiotics - Dilaudid as needed for pain control - Continue to trend lactic acid  Severe hypertension In the setting of uncontrolled abdominal pain due to mesenteric ischemia.  - Resume home antihypertensives - Pain control as noted above  Melena Patient reports that when her abdominal pain became severe, she noted significant bright red blood per rectum.  Her stool subsequently became brown and then in the last couple days, became black.  Hemoglobin is stable at this time, however this may be due to dehydration.  Circumferential wall thickening seen on the descending and sigmoid colon however I would not expect this to be secondary to SMA occlusion.  Discussed with GI; until mesenteric ischemia is stabilized, no indication for intervention at this time.  - Continue to monitor for additional episodes while admitted - CBC daily - Consider general surgery consultation for ischemic colitis  Leukocytosis Suspected to be reactive in the setting of ischemia.  - CBC in a.m.  Depression with anxiety - Continue home Xanax, venlafaxine, mirtazapine.  PDMP reviewed and appropriate  Chronic pain syndrome - Continue home Lyrica and morphine.  PDMP reviewed and appropriate  CAD S/P percutaneous coronary angioplasty Chest pain reported today's most likely related to significant abdominal  pain given it is reproducible when palpating the epigastric region.  - Continue home aspirin, statin, metoprolol  Type 2 diabetes mellitus (HCC) - Hold home antiglycemic agents - Semglee 30 units at bedtime - SSI, moderate  Advance Care Planning:   Code Status: DNR/DNI.  Patient confirms this is still in line with her wishes and her only living relative is her daughter, whose contact is not with computer  Consults: Vascular surgery  Family Communication: No family at bedside.  Patient states her sister passed away  Severity of Illness: The appropriate patient status for this patient is INPATIENT. Inpatient status is judged to be reasonable and necessary in order to provide the required intensity of service to ensure the patient's safety. The patient's presenting symptoms, physical exam findings, and initial radiographic and laboratory data in the context of their chronic comorbidities is felt to place them at high risk for further clinical deterioration. Furthermore, it is not anticipated that the patient will be medically stable for discharge from the hospital within 2 midnights of admission.   * I certify that at the point of admission it is my clinical judgment that the patient will require inpatient hospital care spanning beyond 2 midnights from the point of admission due to high intensity of service, high risk for further deterioration and high frequency of surveillance required.*  Author: Verdene Lennert, MD 07/07/2023 4:37 PM  For on call review [email or web address].

## 2023-07-19 NOTE — Assessment & Plan Note (Addendum)
In the setting of uncontrolled abdominal pain due to mesenteric ischemia.  - Resume home antihypertensives - Pain control as noted above

## 2023-07-19 NOTE — ED Notes (Signed)
First Nurse Note: Patient to ED via [facility] from home for abd pain. Pain ongoing x4 days.  EMS VS: 200/120 97.5 266 cbg

## 2023-07-19 NOTE — Assessment & Plan Note (Signed)
-   Hold home antiglycemic agents - Semglee 30 units at bedtime - SSI, moderate

## 2023-07-19 NOTE — Consult Note (Signed)
ANTICOAGULATION CONSULT NOTE   Pharmacy Consult for heparin infusion Indication: mesenteric ischemia  Allergies  Allergen Reactions   Amoxicillin Diarrhea   Ampicillin Nausea And Vomiting and Other (See Comments)    Did it involve swelling of the face/tongue/throat, SOB, or low BP? Yes Did it involve sudden or severe rash/hives, skin peeling, or any reaction on the inside of your mouth or nose? No Did you need to seek medical attention at a hospital or doctor's office? No When did it last happen? >10 years If all above answers are "NO", may proceed with cephalosporin use.    Augmentin [Amoxicillin-Pot Clavulanate] Diarrhea   Bactrim [Sulfamethoxazole-Trimethoprim] Other (See Comments)    N/V/D   Codeine Nausea And Vomiting   Egg-Derived Products     ALLergic to cook or raw egg only. Food with egg ingredient is okay for patient.    Influenza Vaccines Other (See Comments)    Reaction:  Caused pt to pass out    Influenza Virus Vaccine Other (See Comments)    Reaction: Passed out for 12 days   Methadone Hives and Itching   Oxycodone-Acetaminophen Nausea And Vomiting   Percocet [Oxycodone-Acetaminophen] Nausea And Vomiting   Sulfa Antibiotics Other (See Comments)    Reaction: unknown   Tetanus Antitoxin Swelling and Other (See Comments)    Reaction: injection site swelling   Tetanus Toxoids Swelling and Other (See Comments)    Reaction:  Swelling at injection site   Penicillin G Rash    Has patient had a PCN reaction causing immediate rash, facial/tongue/throat swelling, SOB or lightheadedness with hypotension: Yes Has patient had a PCN reaction causing severe rash involving mucus membranes or skin necrosis: No Has patient had a PCN reaction that required hospitalization: No Has patient had a PCN reaction occurring within the last 10 years: No If all of the above answers are "NO", then may proceed with Cephalosporin use..   Tetracyclines & Related Rash    Patient  Measurements: Height: 5\' 7"  (170.2 cm) Weight: 81.3 kg (179 lb 3.7 oz) IBW/kg (Calculated) : 61.6 Heparin Dosing Weight: 78.3 kg  Vital Signs: Temp: 97.9 F (36.6 C) (07/29 1006) Temp Source: Oral (07/29 1006) BP: 198/112 (07/29 1352) Pulse Rate: 109 (07/29 1352)  Labs: Recent Labs    06/22/2023 1009  HGB 16.3*  HCT 48.9*  PLT 426*  CREATININE 0.76    Estimated Creatinine Clearance: 65.6 mL/min (by C-G formula based on SCr of 0.76 mg/dL).   Medical History: Past Medical History:  Diagnosis Date   Anxiety    Chronic back pain    Collagen vascular disease (HCC)    Coronary artery disease    a. s/p PCI/DES to dRCA & mLCx in 2014 with repeat LHC in 10/2015 showing patent stents   Diabetes mellitus without complication (HCC)    Hypertension    Low back pain    Myocardial infarction (HCC)    Osteomyelitis of toe (HCC) 01/23/2016   Stroke (HCC)     Medications:  No home anticoagulants per pharmacist review.  Assessment: 77 yo female presented to the ED with abdominal pain.  Patient has a history for mesenteric ischemia with stenting.  Pharmacy consulted to initiate heparin infusion.  Goal of Therapy:  Heparin level 0.3-0.7 units/ml Monitor platelets by anticoagulation protocol: Yes   Plan:  Give 4000 units bolus x 1 Start heparin infusion at 1250 units/hr Check anti-Xa level in 8 hours and daily while on heparin Continue to monitor H&H and platelets  Jessica Donovan  Jessica Donovan, PharmD 07/04/2023,2:04 PM

## 2023-07-19 NOTE — Progress Notes (Signed)
Patient MEW elevated as her pain level was elevated prior to admission to this unit as well as upon admission to this unit. Dilaudid dose given. Will retake VS.

## 2023-07-19 NOTE — Assessment & Plan Note (Addendum)
Chest pain reported today's most likely related to significant abdominal pain given it is reproducible when palpating the epigastric region.  - Continue home aspirin, statin, metoprolol

## 2023-07-19 NOTE — Assessment & Plan Note (Signed)
Patient reports that when her abdominal pain became severe, she noted significant bright red blood per rectum.  Her stool subsequently became brown and then in the last couple days, became black.  Hemoglobin is stable at this time, however this may be due to dehydration.  Circumferential wall thickening seen on the descending and sigmoid colon however I would not expect this to be secondary to SMA occlusion.  Discussed with GI; until mesenteric ischemia is stabilized, no indication for intervention at this time.  - Continue to monitor for additional episodes while admitted - CBC daily - Consider general surgery consultation for ischemic colitis

## 2023-07-19 NOTE — ED Notes (Signed)
See triage note. Pt reports mid abd pain x2 weeks radiating to epigastric area. V/d since Thursday. +chills.

## 2023-07-19 NOTE — ED Triage Notes (Addendum)
Pt here via [facility] with abd pain x2 weeks but getting worse. Pt states pain is centered and goes up to her chest. Pt having NVD all weekend with chills. Pt stable in triage.

## 2023-07-20 ENCOUNTER — Inpatient Hospital Stay: Payer: Medicare HMO

## 2023-07-20 ENCOUNTER — Other Ambulatory Visit (HOSPITAL_COMMUNITY): Payer: Self-pay

## 2023-07-20 DIAGNOSIS — I10 Essential (primary) hypertension: Secondary | ICD-10-CM | POA: Diagnosis not present

## 2023-07-20 DIAGNOSIS — K559 Vascular disorder of intestine, unspecified: Principal | ICD-10-CM

## 2023-07-20 DIAGNOSIS — K55059 Acute (reversible) ischemia of intestine, part and extent unspecified: Secondary | ICD-10-CM | POA: Diagnosis not present

## 2023-07-20 DIAGNOSIS — K55069 Acute infarction of intestine, part and extent unspecified: Secondary | ICD-10-CM

## 2023-07-20 DIAGNOSIS — I4891 Unspecified atrial fibrillation: Secondary | ICD-10-CM

## 2023-07-20 DIAGNOSIS — R579 Shock, unspecified: Secondary | ICD-10-CM | POA: Diagnosis not present

## 2023-07-20 DIAGNOSIS — I2510 Atherosclerotic heart disease of native coronary artery without angina pectoris: Secondary | ICD-10-CM | POA: Diagnosis not present

## 2023-07-20 LAB — CBC WITH DIFFERENTIAL/PLATELET
Abs Immature Granulocytes: 0.25 10*3/uL — ABNORMAL HIGH (ref 0.00–0.07)
Basophils Absolute: 0.1 10*3/uL (ref 0.0–0.1)
Basophils Relative: 1 %
Eosinophils Absolute: 0.1 10*3/uL (ref 0.0–0.5)
Eosinophils Relative: 0 %
HCT: 39.3 % (ref 36.0–46.0)
Hemoglobin: 13 g/dL (ref 12.0–15.0)
Immature Granulocytes: 2 %
Lymphocytes Relative: 13 %
Lymphs Abs: 1.9 10*3/uL (ref 0.7–4.0)
MCH: 32 pg (ref 26.0–34.0)
MCHC: 33.1 g/dL (ref 30.0–36.0)
MCV: 96.8 fL (ref 80.0–100.0)
Monocytes Absolute: 2.2 10*3/uL — ABNORMAL HIGH (ref 0.1–1.0)
Monocytes Relative: 15 %
Neutro Abs: 10.1 10*3/uL — ABNORMAL HIGH (ref 1.7–7.7)
Neutrophils Relative %: 69 %
Platelets: 321 10*3/uL (ref 150–400)
RBC: 4.06 MIL/uL (ref 3.87–5.11)
RDW: 13.2 % (ref 11.5–15.5)
Smear Review: NORMAL
WBC: 14.5 10*3/uL — ABNORMAL HIGH (ref 4.0–10.5)
nRBC: 0 % (ref 0.0–0.2)

## 2023-07-20 LAB — COMPREHENSIVE METABOLIC PANEL
ALT: 32 U/L (ref 0–44)
AST: 85 U/L — ABNORMAL HIGH (ref 15–41)
Albumin: 2.7 g/dL — ABNORMAL LOW (ref 3.5–5.0)
Alkaline Phosphatase: 41 U/L (ref 38–126)
Anion gap: 10 (ref 5–15)
BUN: 51 mg/dL — ABNORMAL HIGH (ref 8–23)
CO2: 17 mmol/L — ABNORMAL LOW (ref 22–32)
Calcium: 8.6 mg/dL — ABNORMAL LOW (ref 8.9–10.3)
Chloride: 104 mmol/L (ref 98–111)
Creatinine, Ser: 1.74 mg/dL — ABNORMAL HIGH (ref 0.44–1.00)
GFR, Estimated: 30 mL/min — ABNORMAL LOW (ref >60)
Glucose, Bld: 233 mg/dL — ABNORMAL HIGH (ref 70–99)
Potassium: 3.4 mmol/L — ABNORMAL LOW (ref 3.5–5.1)
Sodium: 133 mmol/L — ABNORMAL LOW (ref 135–145)
Total Bilirubin: 0.4 mg/dL (ref 0.3–1.2)
Total Protein: 5.3 g/dL — ABNORMAL LOW (ref 6.5–8.1)

## 2023-07-20 LAB — POTASSIUM: Potassium: 3.2 mmol/L — ABNORMAL LOW (ref 3.5–5.1)

## 2023-07-20 LAB — LACTIC ACID, PLASMA
Lactic Acid, Venous: 3.6 mmol/L (ref 0.5–1.9)
Lactic Acid, Venous: 5.1 mmol/L (ref 0.5–1.9)

## 2023-07-20 LAB — BLOOD GAS, VENOUS
Acid-base deficit: 7.1 mmol/L — ABNORMAL HIGH (ref 0.0–2.0)
Bicarbonate: 16.8 mmol/L — ABNORMAL LOW (ref 20.0–28.0)
O2 Saturation: 95.6 %
Patient temperature: 37
pCO2, Ven: 29 mmHg — ABNORMAL LOW (ref 44–60)
pH, Ven: 7.37 (ref 7.25–7.43)
pO2, Ven: 71 mmHg — ABNORMAL HIGH (ref 32–45)

## 2023-07-20 LAB — GLUCOSE, CAPILLARY
Glucose-Capillary: 202 mg/dL — ABNORMAL HIGH (ref 70–99)
Glucose-Capillary: 214 mg/dL — ABNORMAL HIGH (ref 70–99)
Glucose-Capillary: 282 mg/dL — ABNORMAL HIGH (ref 70–99)
Glucose-Capillary: 300 mg/dL — ABNORMAL HIGH (ref 70–99)
Glucose-Capillary: 305 mg/dL — ABNORMAL HIGH (ref 70–99)
Glucose-Capillary: 323 mg/dL — ABNORMAL HIGH (ref 70–99)

## 2023-07-20 LAB — MRSA NEXT GEN BY PCR, NASAL: MRSA by PCR Next Gen: NOT DETECTED

## 2023-07-20 LAB — APTT: aPTT: 150 seconds — ABNORMAL HIGH (ref 24–36)

## 2023-07-20 LAB — PROTIME-INR
INR: 1.5 — ABNORMAL HIGH (ref 0.8–1.2)
Prothrombin Time: 18.6 seconds — ABNORMAL HIGH (ref 11.4–15.2)

## 2023-07-20 LAB — PROCALCITONIN: Procalcitonin: 15.65 ng/mL

## 2023-07-20 LAB — HEPARIN LEVEL (UNFRACTIONATED): Heparin Unfractionated: 1.01 IU/mL — ABNORMAL HIGH (ref 0.30–0.70)

## 2023-07-20 LAB — MAGNESIUM: Magnesium: 1.5 mg/dL — ABNORMAL LOW (ref 1.7–2.4)

## 2023-07-20 LAB — PHOSPHORUS: Phosphorus: 2 mg/dL — ABNORMAL LOW (ref 2.5–4.6)

## 2023-07-20 MED ORDER — POTASSIUM PHOSPHATES 15 MMOLE/5ML IV SOLN
15.0000 mmol | Freq: Once | INTRAVENOUS | Status: AC
  Administered 2023-07-21: 15 mmol via INTRAVENOUS
  Filled 2023-07-20: qty 5

## 2023-07-20 MED ORDER — APIXABAN 5 MG PO TABS: 5.0000 mg | ORAL_TABLET | Freq: Two times a day (BID) | ORAL | Status: DC

## 2023-07-20 MED ORDER — AMIODARONE HCL IN DEXTROSE 360-4.14 MG/200ML-% IV SOLN
60.0000 mg/h | INTRAVENOUS | Status: DC
  Administered 2023-07-20 – 2023-07-21 (×2): 60 mg/h via INTRAVENOUS
  Filled 2023-07-20 (×2): qty 200

## 2023-07-20 MED ORDER — HYDROMORPHONE HCL 1 MG/ML IJ SOLN
0.5000 mg | Freq: Once | INTRAMUSCULAR | Status: AC
  Administered 2023-07-20: 0.5 mg via INTRAVENOUS

## 2023-07-20 MED ORDER — AMIODARONE IV BOLUS ONLY 150 MG/100ML
150.0000 mg | Freq: Once | INTRAVENOUS | Status: AC
  Administered 2023-07-20: 150 mg via INTRAVENOUS

## 2023-07-20 MED ORDER — INSULIN ASPART 100 UNIT/ML IJ SOLN
0.0000 [IU] | INTRAMUSCULAR | Status: DC
  Administered 2023-07-20: 5 [IU] via SUBCUTANEOUS
  Administered 2023-07-21: 3 [IU] via SUBCUTANEOUS
  Administered 2023-07-21: 2 [IU] via SUBCUTANEOUS
  Administered 2023-07-21: 3 [IU] via SUBCUTANEOUS
  Filled 2023-07-20 (×3): qty 1

## 2023-07-20 MED ORDER — INSULIN GLARGINE-YFGN 100 UNIT/ML ~~LOC~~ SOLN
45.0000 [IU] | Freq: Every day | SUBCUTANEOUS | Status: DC
  Administered 2023-07-20: 45 [IU] via SUBCUTANEOUS
  Filled 2023-07-20 (×3): qty 0.45

## 2023-07-20 MED ORDER — CALCIUM GLUCONATE-NACL 1-0.675 GM/50ML-% IV SOLN
1.0000 g | Freq: Once | INTRAVENOUS | Status: AC
  Administered 2023-07-20: 1000 mg via INTRAVENOUS
  Filled 2023-07-20: qty 50

## 2023-07-20 MED ORDER — SODIUM CHLORIDE 0.9 % IV SOLN
2.0000 g | INTRAVENOUS | Status: DC
  Administered 2023-07-20: 2 g via INTRAVENOUS
  Filled 2023-07-20: qty 20

## 2023-07-20 MED ORDER — METOPROLOL TARTRATE 5 MG/5ML IV SOLN
2.5000 mg | Freq: Once | INTRAVENOUS | Status: AC
  Administered 2023-07-20: 2.5 mg via INTRAVENOUS

## 2023-07-20 MED ORDER — NOREPINEPHRINE 4 MG/250ML-% IV SOLN
0.0000 ug/min | INTRAVENOUS | Status: DC
  Administered 2023-07-20: 2 ug/min via INTRAVENOUS
  Filled 2023-07-20: qty 250

## 2023-07-20 MED ORDER — PHENYLEPHRINE HCL-NACL 20-0.9 MG/250ML-% IV SOLN
0.0000 ug/min | INTRAVENOUS | Status: DC
  Administered 2023-07-20: 100 ug/min via INTRAVENOUS
  Administered 2023-07-21: 175 ug/min via INTRAVENOUS
  Administered 2023-07-21: 150 ug/min via INTRAVENOUS
  Administered 2023-07-21 (×2): 175 ug/min via INTRAVENOUS
  Administered 2023-07-21: 150 ug/min via INTRAVENOUS
  Filled 2023-07-20 (×8): qty 250

## 2023-07-20 MED ORDER — INSULIN ASPART 100 UNIT/ML IJ SOLN: 0.0000 [IU] | Freq: Every day | INTRAMUSCULAR | Status: DC

## 2023-07-20 MED ORDER — HEPARIN (PORCINE) 25000 UT/250ML-% IV SOLN
1050.0000 [IU]/h | INTRAVENOUS | Status: AC
  Administered 2023-07-21: 1050 [IU]/h via INTRAVENOUS
  Filled 2023-07-20: qty 250

## 2023-07-20 MED ORDER — METRONIDAZOLE 500 MG/100ML IV SOLN
500.0000 mg | Freq: Two times a day (BID) | INTRAVENOUS | Status: DC
  Administered 2023-07-20 – 2023-07-21 (×3): 500 mg via INTRAVENOUS
  Filled 2023-07-20 (×3): qty 100

## 2023-07-20 MED ORDER — IOHEXOL 350 MG/ML SOLN
80.0000 mL | Freq: Once | INTRAVENOUS | Status: AC | PRN
  Administered 2023-07-21: 75 mL via INTRAVENOUS

## 2023-07-20 MED ORDER — CHLORHEXIDINE GLUCONATE CLOTH 2 % EX PADS
6.0000 | MEDICATED_PAD | Freq: Every day | CUTANEOUS | Status: DC
  Administered 2023-07-20 – 2023-07-21 (×2): 6 via TOPICAL

## 2023-07-20 MED ORDER — SODIUM BICARBONATE 8.4 % IV SOLN
50.0000 meq | Freq: Once | INTRAVENOUS | Status: AC
  Administered 2023-07-20: 50 meq via INTRAVENOUS

## 2023-07-20 MED ORDER — PANTOPRAZOLE SODIUM 40 MG IV SOLR
40.0000 mg | INTRAVENOUS | Status: DC
  Administered 2023-07-20: 40 mg via INTRAVENOUS
  Filled 2023-07-20: qty 10

## 2023-07-20 MED ORDER — SODIUM CHLORIDE 0.9 % IV BOLUS
1000.0000 mL | Freq: Once | INTRAVENOUS | Status: AC
  Administered 2023-07-20: 1000 mL via INTRAVENOUS

## 2023-07-20 MED ORDER — MAGNESIUM SULFATE 2 GM/50ML IV SOLN
2.0000 g | Freq: Once | INTRAVENOUS | Status: AC
  Administered 2023-07-20: 2 g via INTRAVENOUS
  Filled 2023-07-20: qty 50

## 2023-07-20 MED ORDER — LACTATED RINGERS IV BOLUS
500.0000 mL | Freq: Once | INTRAVENOUS | Status: AC
  Administered 2023-07-20: 500 mL via INTRAVENOUS

## 2023-07-20 MED ORDER — CIPROFLOXACIN HCL 500 MG PO TABS
500.0000 mg | ORAL_TABLET | Freq: Two times a day (BID) | ORAL | Status: DC
  Administered 2023-07-20: 500 mg via ORAL
  Filled 2023-07-20 (×3): qty 1

## 2023-07-20 MED ORDER — POTASSIUM CHLORIDE 10 MEQ/100ML IV SOLN
10.0000 meq | INTRAVENOUS | Status: AC
  Administered 2023-07-20 (×3): 10 meq via INTRAVENOUS
  Filled 2023-07-20 (×3): qty 100

## 2023-07-20 MED ORDER — INSULIN ASPART 100 UNIT/ML IJ SOLN
0.0000 [IU] | Freq: Three times a day (TID) | INTRAMUSCULAR | Status: DC
  Administered 2023-07-20: 11 [IU] via SUBCUTANEOUS
  Administered 2023-07-20: 8 [IU] via SUBCUTANEOUS
  Filled 2023-07-20 (×2): qty 1

## 2023-07-20 MED ORDER — CIPROFLOXACIN IN D5W 400 MG/200ML IV SOLN
500.0000 mg | Freq: Two times a day (BID) | INTRAVENOUS | Status: DC
  Filled 2023-07-20: qty 400

## 2023-07-20 MED ORDER — AMIODARONE HCL IN DEXTROSE 360-4.14 MG/200ML-% IV SOLN
30.0000 mg/h | INTRAVENOUS | Status: DC
  Administered 2023-07-21: 60 mg/h via INTRAVENOUS
  Filled 2023-07-20: qty 200

## 2023-07-20 MED ORDER — CIPROFLOXACIN IN D5W 400 MG/200ML IV SOLN
400.0000 mg | Freq: Two times a day (BID) | INTRAVENOUS | Status: DC
  Filled 2023-07-20: qty 200

## 2023-07-20 MED ORDER — STERILE WATER FOR INJECTION IV SOLN
INTRAVENOUS | Status: DC
  Filled 2023-07-20: qty 150
  Filled 2023-07-20: qty 1000
  Filled 2023-07-20: qty 150

## 2023-07-20 MED ORDER — AMIODARONE IV BOLUS ONLY 150 MG/100ML
INTRAVENOUS | Status: AC
  Filled 2023-07-20: qty 100

## 2023-07-20 NOTE — Progress Notes (Signed)
An USGPIV (ultrasound guided PIV) has been placed for short-term vasopressor infusion. A correctly placed ivWatch must be used when administering Vasopressors. Should this treatment be needed beyond 24 hours, central line access should be obtained.  It will be the responsibility of the bedside nurse to follow best practice to prevent extravasations.

## 2023-07-20 NOTE — Significant Event (Signed)
Rapid Response Event Note   Reason for Call :  hypotension  Initial Focused Assessment:  Rapid response RN arrived in patient's room with patient sitting up in bed surrounded by 2A staff. Patient pale and tachypneic with rates in upper 30s. BP just taken and 83/48, HR 100-104 ST. Oxygen saturations 96% on 2L nasal cannula. Lungs diminished to auscultation.  She was alert but easily winded with conversation and could take some time finding her words. Patient reported abdominal pain which she has been dealing with all admission and dizziness and light headedness she states started at lunch. Repeat BPs during assessment were 88/50 MAP 62 and 78/49 MAP 59. CBG in 300s.  Interventions:  IV fluid bolus started per orders. Patient to transfer to ICU 7.  Plan of Care:  Patient transferred to ICU 7. Report given to Sheria Lang and Sport and exercise psychologist.  Event Summary:   MD Notified: Dr. Allena Katz Call Time: 17:06 Arrival Time: 17:07 End Time: 17:56   Angela Platner, Theresia Lo, RN

## 2023-07-20 NOTE — Progress Notes (Signed)
[facility]  - [place] [facility]   PATIENT NAME: Jessica Donovan    MR#:  [id number]  DATE OF BIRTH:  [birth year]  SUBJECTIVE:  no family at bedside. Patient seen earlier. The trail full with ongoing abdominal pain. She does have history of chronic pain and on-call chronic narcotics. Underwent vascular procedure with mechanical thrombectomy stent placement in superior mesenteric artery. Currently on IV heparin drip. Started on clear liquid diet. No nausea vomiting.    VITALS:  Blood pressure 111/87, pulse (!) 110, temperature 98.2 F (36.8 C), resp. rate 18, height 5\' 7"  (1.702 m), weight 78.9 kg, SpO2 96%.  PHYSICAL EXAMINATION:   GENERAL:  77 y.o.-year-old patient with no acute distress. obese LUNGS: Normal breath sounds bilaterally, no wheezing CARDIOVASCULAR: S1, S2 normal. No murmur   ABDOMEN: Soft, diffusely tender, nondistended. EXTREMITIES: No  edema b/l.    NEUROLOGIC: nonfocal  patient is alert and awake, anxious SKIN: No obvious rash, lesion, or ulcer.   LABORATORY PANEL:  CBC Recent Labs  Lab 07/20/23 0411  WBC 13.5*  HGB 15.3*  HCT 46.6*  PLT 337    Chemistries  Recent Labs  Lab 07/02/2023 1009 07/20/23 0411  NA 135 138  K 3.6 3.5  CL 99 105  CO2 21* 20*  GLUCOSE 282* 322*  BUN 21 33*  CREATININE 0.76 0.88  CALCIUM 10.1 9.3  AST 37  --   ALT 12  --   ALKPHOS 81  --   BILITOT 0.6  --     RADIOLOGY:  PERIPHERAL VASCULAR CATHETERIZATION  Result Date: 06/22/2023 See surgical note for result.  CT Angio Abd/Pel W and/or Wo Contrast  Result Date: 07/07/2023 CLINICAL DATA:  Acute mesenteric ischemia. EXAM: CTA ABDOMEN AND PELVIS WITHOUT AND WITH CONTRAST TECHNIQUE: Multidetector CT imaging of the abdomen and pelvis was performed using the standard protocol during bolus administration of intravenous contrast. Multiplanar reconstructed images and MIPs were obtained and reviewed to evaluate the vascular anatomy. RADIATION DOSE  REDUCTION: This exam was performed according to the departmental dose-optimization program which includes automated exposure control, adjustment of the mA and/or kV according to patient size and/or use of iterative reconstruction technique. CONTRAST:  OMNIPAQUE IOHEXOL 350 MG/ML SOLN COMPARISON:  Chest CT-04/06/2023; abdominal CT-01/12/2023 FINDINGS: VASCULAR Aorta: There is a moderate amount of mixed calcified and noncalcified atherosclerotic plaque throughout the normal caliber abdominal aorta, not resulting in a hemodynamically significant stenosis. No evidence of abdominal aortic dissection or perivascular stranding. Celiac: There is a moderate amount of mixed calcified and noncalcified atherosclerotic plaque involving the origin of the celiac artery, approaching 50% luminal narrowing. Conventional branching pattern. SMA: The patient has undergone interval stenting of the origin and proximal aspect of the SMA. The stent appears occluded with early reconstitution of the SMA via hypertrophied collateral supply from the celiac and likely retrograde supply from the IMA. 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 There is a minimal amount of eccentric calcified plaque involving the origin of the bilateral renal arteries, left-greater-than-right, not definitely resulting in hemodynamically significant stenosis. No vessel irregularity to suggest FMD. IMA: Remains patent, likely with retrograde collateral supply to the SMA distribution. Inflow: There is a minimal amount of eccentric predominantly calcified atherosclerotic plaque involving the bilateral common iliac arteries, not resulting in hemodynamically significant stenosis. The bilateral external iliac arteries are tortuous but of normal caliber and without hemodynamically significant narrowing. The bilateral  internal iliac arteries are diseased though patent and of  normal caliber. Proximal Outflow: There is a very minimal amount calcified and noncalcified atherosclerotic plaque involving the bilateral common femoral arteries, right-greater-than-left, not definitely resulting in hemodynamically significant stenosis. The imaged portions of the bilateral deep and superficial femoral arteries are of normal caliber and widely patent throughout their imaged courses. Veins: The IVC and pelvic venous systems appear widely patent. Review of the MIP images confirms the above findings. _________________________________________________________ NON-VASCULAR Lower chest: Limited visualization of the lower thorax demonstrates minimal dependent subpleural ground-Pacini atelectasis. There is a minimal amount of subsegmental atelectasis involving the caudal aspect of the lingula. No discrete focal airspace opacities. Normal heart size. Coronary artery calcifications. Calcifications involving the mitral valve annulus. No pericardial effusion. Hepatobiliary: Mild nodularity of the of the hepatic contour with partial recanalization of the periumbilical vein. No discrete hepatic lesions. Postcholecystectomy. There is mild dilatation of the common bile duct measuring 0.8 cm in diameter (image 71, series 13), with associated mild intrahepatic biliary ductal dilatation, likely the sequela of biliary reservoir phenomena in the setting of postcholecystectomy state. No ascites. Pancreas: The pancreas is largely fatty replaced, unchanged. Spleen: Normal appearance of the spleen. Adrenals/Urinary Tract: There is symmetric enhancement of the bilateral kidneys. No evidence of nephrolithiasis on this postcontrast examination. No discrete renal lesions. There is a minimal amount of likely age and body habitus related perinephric stranding. No urinary obstruction. Normal appearance of the bilateral adrenal glands. Normal appearance of the urinary bladder given degree of distention. Stomach/Bowel: There is a  moderate amount of liquid stool seen within the cecum and ascending colon. There is circumferential wall thickening involving the descending and sigmoid colon, not resulting in enteric obstruction. The appendix is not visualized, however there is no pericecal inflammatory change. No significant hiatal hernia. No pneumoperitoneum, pneumatosis or portal venous gas. Lymphatic: No bulky retroperitoneal, mesenteric, pelvic or inguinal lymphadenopathy. Reproductive: Post hysterectomy.  No discrete adnexal lesions. Other: Well-healed midline lower abdominal/pelvic incision without hernia. Tiny mesenteric fat containing periumbilical hernia. Musculoskeletal: No acute or aggressive osseous abnormalities. Severe multilevel lumbar spine DDD, worse at L3-L4, L4-L5 and L5-S1 with disc space height loss, endplate irregularity and sclerosis. Severe bilateral facet degenerative change is seen within the lower lumbar spine. Moderate to severe degenerative change of the bilateral hips with joint space loss, subchondral sclerosis and osteophytosis, right-greater-than-left. IMPRESSION: Vascular Impression: 1. Interval stenting of the origin and proximal aspect of the SMA with occlusion of the stent and early reconstitution of the SMA via hypertrophied collateral supply from the celiac and IMA. 2. Moderate amount of mixed calcified and noncalcified atherosclerotic plaque involving the origin of the celiac artery approaches 50% luminal narrowing. 3. Aortic Atherosclerosis (ICD10-I70.0). Nonvascular Impression: 1. Circumferential wall thickening involving the descending and sigmoid colon without evidence of pneumatosis or portal venous gas, nonspecific though given SMA stent occlusion, early ischemic changes are not excluded on the basis of this examination. Correlation with lactate levels could be performed as indicated. 2. Mild nodularity of the hepatic contour with partial recanalization of the periumbilical vein, nonspecific though  could be seen in the setting of early cirrhosis. Correlation with LFTs is advised. Electronically Signed   By: Simonne Come M.D.   On: 07/03/2023 13:06    Assessment and Plan  Jessica Donovan is a 77 y.o. 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  pt states that she has been experiencing severe generalized abdominal pain that has gradually worsened especially in the last 1 week.  In addition, over the last 1 week, she has been having nausea with nonbloody, nonmelanotic emesis.   CTA was obtained that demonstrated occlusion of the SMA stent with early reconstitution via hypertrophied collaterals. In addition, there was circumferential wall thickening involving the descending and sigmoid colon concerning for possible early ischemic changes.   Acute mesenteric ischemia (HCC) Patient is presenting with excruciating abdominal pain, hematochezia followed by melena, and evidence of SMA occlusion seen on CTA.  Lactic acid is elevated.  - Vascular surgery consulted s/p  Mechanical thrombectomy of the SMA with the penumbra CAT 6 device. Stent t o the SMA with 7 mm diameter x 26 mm length balloon expandable Lifestream stent postdilated with an 8 mm balloon proximally - Heparin infusion per pharmacy dosing--change to Asa + Plavix from tomorrow. --For some unknown reason pt does not want Eliquis (recommended by vascular)--if she decides then her co-pay is $47/month. I have spoken with her about it - Start empiric antibiotics - Dilaudid as needed for pain control - Continue to trend lactic acid --advance diet as tolerated --start CLD --advance as tolerated to soft   Severe hypertension In the setting of uncontrolled abdominal pain due to mesenteric ischemia.  - Resume home antihypertensives - Pain control as noted above   Melena -  Continue to monitor for additional episodes while admitted - hgb stable   Leukocytosis Suspected to be reactive in the setting of ischemia/colitis --wbc 24K--13.5K   Depression with anxiety - Continue home Xanax, venlafaxine, mirtazapine.     Chronic pain syndrome - Continue home Lyrica and morphine bid.     CAD S/P percutaneous coronary angioplasty Chest pain reported today's most likely related to significant abdominal pain given it is reproducible when palpating the epigastric region.  - Continue home aspirin, statin, metoprolol   Type 2 diabetes mellitus (HCC), uncontrolled - Semglee 45 units at bedtime--now that diet has been started - SSI, moderate  PT to see pt   Advance Care Planning:   Code Status: DNR/DNI.  Patient confirms this is still in line with her wishes  Consults: Vascular surgery  Family Communication: No family at bedside.  Patient states her sister passed away    Procedures:Mechanical thrombectomy of the SMA with the penumbra CAT 6 device. Stent to the SMA  DVT Prophylaxis :heparin gtt Level of care: Progressive Status is: Inpatient Remains inpatient appropriate because: ischemic colitis       TOTAL TIME TAKING CARE OF THIS PATIENT: 35 minutes.  >50% time spent on counselling and coordination of care  Note: This dictation was prepared with Dragon dictation along with smaller phrase technology. Any transcriptional errors that result from this process are unintentional.  [name] M.D    [facility]   CC: Primary care physician; Mick Sell, MD

## 2023-07-20 NOTE — Progress Notes (Signed)
An USGPIV (ultrasound guided PIV) has been placed for short-term vasopressor infusion. A correctly placed ivWatch must be used when administering Vasopressors. Should this treatment be needed beyond 24 hours, central line access should be obtained.  It will be the responsibility of the bedside nurse to follow best practice to prevent extravasations. This site is Rt lower posterior forearm # 22, 1.75" length marked for ICU RN to place IV watch

## 2023-07-20 NOTE — Progress Notes (Signed)
Progress Note    07/20/2023 10:04 AM 1 Day Post-Op  Subjective:  Jessica Donovan is a 77 yo female now POD#1 from Mechanical thrombectomy of superior mesenteric artery that was previously stented. Patient has been non compliant with her anti platelet therapy. Patient endorses 10/10 pain this morning in her lower middle abdomen. This pain is different than the presenting abdominal pain which was radiating into her chest. I had a discussion with Dr [name] and the plan is to start her on antibiotics. Patients vitals all remain stable.    Vitals:   07/20/23 0326 07/20/23 0800  BP: 124/66 (!) 140/69  Pulse: (!) 117 (!) 117  Resp: 20 16  Temp: 99.4 F (37.4 C) 98 F (36.7 C)  SpO2: 97% 98%   Physical Exam: Cardiac:  RRR with tachycardia. No murmurs present Lungs:  Clear throughout on auscultation.  No rales, rhonchi or wheezing to note. Incisions: Right groin incision with dressing clean dry and intact.  No hematoma seroma to note. Extremities: All extremities have palpable pulses.  Right lower extremity remains warm to touch Abdomen: Positive bowel sounds throughout, soft, positive tenderness to lower middle abdomen.  Guarding noted. Neurologic: Alert and oriented x 3, follows all commands and answers all questions appropriately.  CBC    Component Value Date/Time   WBC 13.5 (H) 07/20/2023 0411   RBC 4.83 07/20/2023 0411   HGB 15.3 (H) 07/20/2023 0411   HGB 11.8 (L) 12/15/2014 0958   HCT 46.6 (H) 07/20/2023 0411   HCT 37.0 12/15/2014 0958   PLT 337 07/20/2023 0411   PLT 269 12/15/2014 0958   MCV 96.5 07/20/2023 0411   MCV 91 12/15/2014 0958   MCH 31.7 07/20/2023 0411   MCHC 32.8 07/20/2023 0411   RDW 12.9 07/20/2023 0411   RDW 17.0 (H) 12/15/2014 0958   LYMPHSABS 1.1 07/20/2023 0411   MONOABS 2.1 (H) 07/20/2023 0411   EOSABS 0.1 07/20/2023 0411   BASOSABS 0.0 07/20/2023 0411    BMET    Component Value Date/Time   NA 138 07/20/2023 0411   NA 142 12/15/2014 0958    K 3.5 07/20/2023 0411   K 4.0 12/15/2014 0958   CL 105 07/20/2023 0411   CL 109 (H) 12/15/2014 0958   CO2 20 (L) 07/20/2023 0411   CO2 26 12/15/2014 0958   GLUCOSE 322 (H) 07/20/2023 0411   GLUCOSE 181 (H) 12/15/2014 0958   BUN 33 (H) 07/20/2023 0411   BUN 19 (H) 12/15/2014 0958   CREATININE 0.88 07/20/2023 0411   CREATININE 0.67 12/15/2014 0958   CALCIUM 9.3 07/20/2023 0411   CALCIUM 8.4 (L) 12/15/2014 0958   GFRNONAA >60 07/20/2023 0411   GFRNONAA >60 12/15/2014 0958   GFRAA >60 03/27/2020 0126   GFRAA >60 12/15/2014 0958    INR    Component Value Date/Time   INR 1.3 (H) 07/20/2023 0411     Intake/Output Summary (Last 24 hours) at 07/20/2023 1004 Last data filed at 07/20/2023 1610 Gross per 24 hour  Intake 1272.39 ml  Output 250 ml  Net 1022.39 ml     Assessment/Plan:  77 y.o. female is s/p Mechanical thrombectomy of superior mesenteric artery that was previously stented. 1 Day Post-Op   PLAN: Per Hospitalist Starting Antibiotics today.  Pain medication PRN  Advance diet to Clear Liquids if tolerated.  PT/OT  Continue Heparin infusion one more day then convert to oral anticoagulation.   DVT prophylaxis:  Heparin Infusion   Twilla Khouri R [facility] 07/20/2023 10:04 AM

## 2023-07-20 NOTE — Inpatient Diabetes Management (Signed)
Inpatient Diabetes Program Recommendations  AACE/ADA: New Consensus Statement on Inpatient Glycemic Control (2015)  Target Ranges:  Prepandial:   less than 140 mg/dL      Peak postprandial:   less than 180 mg/dL (1-2 hours)      Critically ill patients:  140 - 180 mg/dL   Lab Results  Component Value Date   GLUCAP 305 (H) 07/20/2023   HGBA1C 6.7 (H) 04/07/2023    Latest Reference Range & Units 07/11/2023 15:52 07/08/2023 20:17 07/20/23 07:59 07/20/23 12:09  Glucose-Capillary 70 - 99 mg/dL 865 (H) 784 (H) 696 (H) 305 (H)  (H): Data is abnormally high  Review of Glycemic Control  Diabetes history: DM2 Outpatient Diabetes medications: Toujeo 60 units every day, Novolog 3-15 tid sliding scale Current orders for Inpatient glycemic control: Semglee 30 units q hs, Novolog 0-15 units tid, 0-5 units hs  Inpatient Diabetes Program Recommendations:   Patient sees Dr. Tedd Sias for endocrinologist and last office visit was 05/04/23. Please consider: -Increase Semglee to 45 units q hs -Add Novolog meal coverage when eating  Will follow while hospitalized.  Thank you, Jessica Donovan. Jessica Demonbreun, RN, MSN, CDE  Diabetes Coordinator Inpatient Glycemic Control Team Team Pager [phone] (8am-5pm) 07/20/2023 12:29 PM

## 2023-07-20 NOTE — TOC Benefit Eligibility Note (Signed)
Pharmacy Patient Advocate Encounter  Insurance verification completed.    The patient is insured through [employer] Part D   Ran test claim for Eliquis. Currently a quantity of 60 is a 30 day supply and the co-pay is $47.00 .   This test claim was processed through [facility]- copay amounts may vary at other pharmacies due to pharmacy/plan contracts, or as the patient moves through the different stages of their insurance plan.

## 2023-07-20 NOTE — Consult Note (Signed)
ANTICOAGULATION CONSULT NOTE   Pharmacy Consult for heparin infusion Indication: mesenteric ischemia  Allergies  Allergen Reactions   Amoxicillin Diarrhea   Ampicillin Nausea And Vomiting and Other (See Comments)    Did it involve swelling of the face/tongue/throat, SOB, or low BP? Yes Did it involve sudden or severe rash/hives, skin peeling, or any reaction on the inside of your mouth or nose? No Did you need to seek medical attention at a hospital or doctor's office? No When did it last happen? >10 years If all above answers are "NO", may proceed with cephalosporin use.    Augmentin [Amoxicillin-Pot Clavulanate] Diarrhea   Bactrim [Sulfamethoxazole-Trimethoprim] Other (See Comments)    N/V/D   Codeine Nausea And Vomiting   Egg-Derived Products     ALLergic to cook or raw egg only. Food with egg ingredient is okay for patient.    Influenza Vaccines Other (See Comments)    Reaction:  Caused pt to pass out    Influenza Virus Vaccine Other (See Comments)    Reaction: Passed out for 12 days   Methadone Hives and Itching   Oxycodone-Acetaminophen Nausea And Vomiting   Percocet [Oxycodone-Acetaminophen] Nausea And Vomiting   Sulfa Antibiotics Other (See Comments)    Reaction: unknown   Tetanus Antitoxin Swelling and Other (See Comments)    Reaction: injection site swelling   Tetanus Toxoids Swelling and Other (See Comments)    Reaction:  Swelling at injection site   Penicillin G Rash    Has patient had a PCN reaction causing immediate rash, facial/tongue/throat swelling, SOB or lightheadedness with hypotension: Yes Has patient had a PCN reaction causing severe rash involving mucus membranes or skin necrosis: No Has patient had a PCN reaction that required hospitalization: No Has patient had a PCN reaction occurring within the last 10 years: No If all of the above answers are "NO", then may proceed with Cephalosporin use..   Tetracyclines & Related Rash    Patient  Measurements: Height: 5\' 7"  (170.2 cm) Weight: 78.9 kg (174 lb) IBW/kg (Calculated) : 61.6 Heparin Dosing Weight: 78.3 kg  Vital Signs: Temp: 98 F (36.7 C) (07/30 0800) BP: 140/69 (07/30 0800) Pulse Rate: 117 (07/30 0800)  Labs: Recent Labs    07/18/2023 1009 07/08/2023 1411 07/20/23 0411  HGB 16.3*  --  15.3*  HCT 48.9*  --  46.6*  PLT 426*  --  337  APTT  --  26  --   LABPROT  --  14.6 16.0*  INR  --  1.1 1.3*  CREATININE 0.76  --  0.88    Estimated Creatinine Clearance: 58.8 mL/min (by C-G formula based on SCr of 0.88 mg/dL).   Medical History: Past Medical History:  Diagnosis Date   Anxiety    Chronic back pain    Collagen vascular disease (HCC)    Coronary artery disease    a. s/p PCI/DES to dRCA & mLCx in 2014 with repeat LHC in 10/2015 showing patent stents   Diabetes mellitus without complication (HCC)    Hypertension    Low back pain    Myocardial infarction (HCC)    Osteomyelitis of toe (HCC) 01/23/2016   Stroke (HCC)     Medications:  No home anticoagulants per pharmacist review.  Assessment: 77 yo female presented to the ED with abdominal pain.  Patient has a history for mesenteric ischemia with stenting.  Pharmacy consulted to initiate heparin infusion.  Vascula procedure performed 7/30. Per Dr Wyn Quaker, please continue heparin until 7/31, then transition  to Eliquis. May need to transition to ASA + Plavix instead due to cost (MD to discuss with patient).  Goal of Therapy:  Heparin level 0.3-0.7 units/ml Monitor platelets by anticoagulation protocol: Yes   Plan:  Continue heparin infusion at 1250 units/hr Check HL in 8 hours and daily while on heparin Continue to monitor H&H and platelets  Jory Tanguma Rodriguez-Guzman PharmD, BCPS 07/20/2023 9:05 AM

## 2023-07-20 NOTE — Evaluation (Signed)
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 Hospital course significant for angiogram and thrombectomy to SMA via R femoral artery (07/12/2023)  Clinical Impression  Patient resting in bed upon arrival to session; cleared for participation with session per primary RN.  Patient oriented to basic information; follows commands and agreeable to session.  Appears generally anxious and painful, requiring consistent encouragement and reassurance throughout session.  Endorses abdominal pain 8-9/10; meds received prior to session.  Bilat UE/LE strength and ROM grossly symmetrical and WFL; no focal weakness appreciated.  Currently requiring min/mod assist for bed mobility; min/mod assist for sit/stand, standing balance and SPT from bed/chair with RW.  Requires assist for lift off, standing balance; limited tolerance for full postural extension due to abdominal pain.  Additional gait efforts deferred; will continue to assess/progress in subsequent sessions as appropriate. Patient appears to be quite anxious throughout evaluation-educated in pursed lip breathing, relaxation and repositioning techniques to help calm.  Patient voiced and demonstrated understanding, but unable to consistently utilize strategies.  Provided with frequent encouragement and reassurance throughout session Would benefit from skilled PT to address above deficits and promote optimal return to PLOF.; recommend post-acute PT follow up as indicated by interdisciplinary care team.           If plan is discharge home, recommend the following: A little help with walking and/or transfers;A little help with bathing/dressing/bathroom   Can travel by private vehicle   No    Equipment  Recommendations    Recommendations for Other Services       Functional Status Assessment Patient has had a recent decline in their functional status and demonstrates the ability to make significant improvements in function in a reasonable and predictable amount of time.     Precautions / Restrictions Precautions Precautions: Fall Restrictions Weight Bearing Restrictions: No      Mobility  Bed Mobility Overal bed mobility: Needs Assistance Bed Mobility: Supine to Sit     Supine to sit: Min assist, Mod assist          Transfers Overall transfer level: Needs assistance Equipment used: Rolling walker (2 wheels) Transfers: Sit to/from Stand, Bed to chair/wheelchair/BSC Sit to Stand: Min assist, Mod assist, +2 safety/equipment Stand pivot transfers: Mod assist, Min assist, +2 safety/equipment         General transfer comment: assist for lift off and standing balance; limited tolerance for upright due to abdominal pain    Ambulation/Gait               General Gait Details: deferred due to pain  Stairs            Wheelchair Mobility     Tilt Bed    Modified Rankin (Stroke Patients Only)       Balance Overall balance assessment: Needs assistance Sitting-balance support: No upper extremity supported, Feet supported Sitting balance-Leahy Scale: Good     Standing balance support: Bilateral upper extremity supported Standing balance-Leahy Scale: Fair                               Pertinent Vitals/Pain Pain Assessment Pain Assessment: Faces Faces Pain Scale: Hurts whole lot Pain Location: abdomen Pain Descriptors / Indicators: Aching, Grimacing, Guarding Pain Intervention(s): Limited activity within patient's  tolerance, Monitored during session, Repositioned    Home Living Family/patient expects to be discharged to:: Private residence Living Arrangements: Alone Available Help at Discharge: Available  PRN/intermittently;Friend(s) Type of Home: Apartment         Home Layout: One level Home Equipment: Rollator (4 wheels);BSC/3in1;Tub bench      Prior Function Prior Level of Function : Independent/Modified Independent;Needs assist             Mobility Comments: Amb without assist device in the home, rollator in the community; no home O2 ADLs Comments: MOD I in ADL, assist from aid with IADLs (driving, cooking, cleaning, grocery shopping)     Hand Dominance   Dominant Hand: Right    Extremity/Trunk Assessment   Upper Extremity Assessment Upper Extremity Assessment: Overall WFL for tasks assessed (grossly at least 4-/5 throughout)    Lower Extremity Assessment Lower Extremity Assessment: Overall WFL for tasks assessed (grossly at least 4-/5 throughout)       Communication   Communication: HOH  Cognition Arousal/Alertness: Awake/alert Behavior During Therapy: WFL for tasks assessed/performed, Anxious Overall Cognitive Status: Within Functional Limits for tasks assessed                                          General Comments      Exercises Other Exercises Other Exercises: Patient appears to be quite anxious throughout evaluation-educated in pursed lip breathing, relaxation and repositioning techniques to help calm.  Patient voiced and demonstrated understanding, but unable to consistently utilize strategies.  Provided with frequent encouragement and reassurance throughout session   Assessment/Plan    PT Assessment Patient needs continued PT services  PT Problem List Decreased strength;Decreased balance;Decreased activity tolerance;Decreased mobility;Decreased coordination;Decreased cognition;Decreased knowledge of use of DME;Decreased safety awareness;Decreased knowledge of precautions;Cardiopulmonary status limiting activity;Obesity       PT Treatment Interventions DME instruction;Gait training;Therapeutic activities;Functional mobility  training;Stair training;Therapeutic exercise;Balance training;Patient/family education;Cognitive remediation    PT Goals (Current goals can be found in the Care Plan section)  Acute Rehab PT Goals Patient Stated Goal: to make the pain better PT Goal Formulation: With patient Time For Goal Achievement: 08/03/23 Potential to Achieve Goals: Good    Frequency Min 1X/week     Co-evaluation               AM-PAC PT "6 Clicks" Mobility  Outcome Measure Help needed turning from your back to your side while in a flat bed without using bedrails?: A Little Help needed moving from lying on your back to sitting on the side of a flat bed without using bedrails?: A Lot Help needed moving to and from a bed to a chair (including a wheelchair)?: A Lot Help needed standing up from a chair using your arms (e.g., wheelchair or bedside chair)?: A Lot Help needed to walk in hospital room?: A Lot Help needed climbing 3-5 steps with a railing? : A Lot 6 Click Score: 13    End of Session Equipment Utilized During Treatment: Gait belt Activity Tolerance: Patient tolerated treatment well;Patient limited by pain Patient left: in chair;with call bell/phone within reach;with chair alarm set Nurse Communication: Mobility status PT Visit Diagnosis: Muscle weakness (generalized) (M62.81);Difficulty in walking, not elsewhere classified (R26.2)    Time: 4098-1191 PT Time Calculation (min) (ACUTE ONLY): 30 min   Charges:   PT Evaluation $PT Eval Moderate Complexity: 1 Mod   PT General Charges $$  ACUTE PT VISIT: 1 Visit        Eilis Chestnutt H. Manson Passey, PT, DPT, NCS 07/20/23, 4:05 PM [phone]

## 2023-07-20 NOTE — Progress Notes (Signed)
Patient called out stating she thinks she is having a stroke. Primary RN Soumoun notified, Dr. Allena Katz notified, Charge RN Amy notified. Vitals assessed and stable, NIH performed.     07/20/23 1026  NIH Stroke Scale   Dizziness Present No  Headache Present No  Complete NIHSS TOTAL 0  Interval Initial  Level of Consciousness (1a.)    0  LOC Questions (1b. )    0  LOC Commands (1c. )    0  Best Gaze (2. )   0  Visual (3. )   0  Facial Palsy (4. )     0  Motor Arm, Left (5a. )    0  Motor Arm, Right (5b. )  0  Motor Leg, Left (6a. )   0  Motor Leg, Right (6b. )  0  Limb Ataxia (7. ) 0  Sensory (8. )   0  Best Language (9. )   0  Dysarthria (10. ) 0  Extinction/Inattention (11.)    0

## 2023-07-20 NOTE — Consult Note (Signed)
ANTICOAGULATION CONSULT NOTE   Pharmacy Consult for heparin infusion Indication: mesenteric ischemia  Allergies  Allergen Reactions   Amoxicillin Diarrhea   Ampicillin Nausea And Vomiting and Other (See Comments)    Did it involve swelling of the face/tongue/throat, SOB, or low BP? Yes Did it involve sudden or severe rash/hives, skin peeling, or any reaction on the inside of your mouth or nose? No Did you need to seek medical attention at a hospital or doctor's office? No When did it last happen? >10 years If all above answers are "NO", may proceed with cephalosporin use.    Augmentin [Amoxicillin-Pot Clavulanate] Diarrhea   Bactrim [Sulfamethoxazole-Trimethoprim] Other (See Comments)    N/V/D   Codeine Nausea And Vomiting   Egg-Derived Products     ALLergic to cook or raw egg only. Food with egg ingredient is okay for patient.    Influenza Vaccines Other (See Comments)    Reaction:  Caused pt to pass out    Influenza Virus Vaccine Other (See Comments)    Reaction: Passed out for 12 days   Methadone Hives and Itching   Oxycodone-Acetaminophen Nausea And Vomiting   Percocet [Oxycodone-Acetaminophen] Nausea And Vomiting   Sulfa Antibiotics Other (See Comments)    Reaction: unknown   Tetanus Antitoxin Swelling and Other (See Comments)    Reaction: injection site swelling   Tetanus Toxoids Swelling and Other (See Comments)    Reaction:  Swelling at injection site   Penicillin G Rash    Has patient had a PCN reaction causing immediate rash, facial/tongue/throat swelling, SOB or lightheadedness with hypotension: Yes Has patient had a PCN reaction causing severe rash involving mucus membranes or skin necrosis: No Has patient had a PCN reaction that required hospitalization: No Has patient had a PCN reaction occurring within the last 10 years: No If all of the above answers are "NO", then may proceed with Cephalosporin use..   Tetracyclines & Related Rash    Patient  Measurements: Height: 5\' 7"  (170.2 cm) Weight: 78.9 kg (174 lb) IBW/kg (Calculated) : 61.6 Heparin Dosing Weight: 78.3 kg  Vital Signs: Temp: 97.8 F (36.6 C) (07/30 1646) BP: 88/50 (07/30 1713) Pulse Rate: 102 (07/30 1646)  Labs: Recent Labs    06/30/2023 1009 06/24/2023 1411 07/20/23 0411 07/20/23 1650  HGB 16.3*  --  15.3*  --   HCT 48.9*  --  46.6*  --   PLT 426*  --  337  --   APTT  --  26  --   --   LABPROT  --  14.6 16.0*  --   INR  --  1.1 1.3*  --   HEPARINUNFRC  --   --   --  1.01*  CREATININE 0.76  --  0.88  --     Estimated Creatinine Clearance: 58.8 mL/min (by C-G formula based on SCr of 0.88 mg/dL).   Medical History: Past Medical History:  Diagnosis Date   Anxiety    Chronic back pain    Collagen vascular disease (HCC)    Coronary artery disease    a. s/p PCI/DES to dRCA & mLCx in 2014 with repeat LHC in 10/2015 showing patent stents   Diabetes mellitus without complication (HCC)    Hypertension    Low back pain    Myocardial infarction (HCC)    Osteomyelitis of toe (HCC) 01/23/2016   Stroke (HCC)     Medications:  No home anticoagulants per pharmacist review.  Assessment: 77 yo female presented to the ED  with abdominal pain.  Patient has a history for mesenteric ischemia with stenting.  Pharmacy consulted to initiate heparin infusion.  Vascula procedure performed 7/30. Per Dr Wyn Quaker, please continue heparin until 7/31, then transition to Eliquis. May need to transition to ASA + Plavix instead due to cost (MD to discuss with patient).  Goal of Therapy:  Heparin level 0.3-0.7 units/ml Monitor platelets by anticoagulation protocol: Yes   07/30 1650 HL 1.01, supratherapeutic  Plan:  Continue heparin infusion at 1050 units/hr Check HL in 8 hours and daily while on heparin Heparin infusion scheduled to stop 7/31 @ 1000 and begin Eliquis Continue to monitor H&H and platelets  Otelia Sergeant, PharmD, [facility] 07/20/2023 5:32 PM

## 2023-07-20 NOTE — Consult Note (Signed)
NAME:  Jessica Donovan, MRN:  [id number], DOB:  [birth year], LOS: 1 ADMISSION DATE:  07/14/2023, CONSULTATION DATE: 07/20/2023 REFERRING MD: Dr. Allena Katz, CHIEF COMPLAINT: Hypotension    History of Present Illness:  This is a 77 yo female with a PMH of type II diabetes mellitus, osteomyelitis, HTN, and chronic mesenteric ischemia with celiac stenosis s/p mesenteric artery stenting on 04/05/23 per vascular surgery at [facility].  She presented to [facility] ER on 07/29 via EMS from home with a 2 week hx of worsening abdominal pain.  Per ER notes she also endorsed nausea, vomiting, chills, and diarrhea.  She also had 1 episode of bloody diarrhea.    ED Course Upon arrival to the ER significant lab results were: glucose 282/wbc 24.7/platelets 426/lactic acid 3.6.  She received iv fluids, morphine, and zofran.  CTA Abd Pelvis revealed SMA occlusion of prior stent placement.  Vascular Surgery consulted by EDP, following evaluation proceeded with mechanical thrombectomy of superior mesenteric artery that was previously stented.  Pt admitted to the progressive care unit per hospitalist for additional workup and treatment.  See detailed hospital course below under significant events   CTA Abd/Pelvis W&WO Contrast: Interval stenting of the origin and proximal aspect of the SMA with occlusion of the stent and early reconstitution of the SMA via hypertrophied collateral supply from the celiac and IMA. Moderate amount of mixed calcified and noncalcified atherosclerotic plaque involving the origin of the celiac artery approaches 50% luminal narrowing. Aortic Atherosclerosis (ICD10-I70.0). Circumferential wall thickening involving the descending and sigmoid colon without evidence of pneumatosis or portal venous gas, nonspecific though given SMA stent occlusion, early ischemic changes are not excluded on the basis of this examination. Correlation with lactate levels could be performed as indicated. Mild nodularity of the  hepatic contour with partial recanalization of the periumbilical vein, nonspecific though could be seen in the setting of early cirrhosis. Correlation with LFTs is advised.  KUB 07/20/23: Moderate air distension of the stomach and bowel, possible ileus. Appearance suggesting possible intramural air within the left pelvic bowel loop, CT suggested for further assessment. Pertinent  Medical History  Anxiety Chronic Back Pain  Collagen Vascular Disease  CAD  Type II Diabetes Mellitus  HTN  MI  Osteomyelitis  Stroke   Significant Hospital Events: Including procedures, antibiotic start and stop dates in addition to other pertinent events   07/29: Pt admitted to the progressive care unit SMA occlusion of prior stent placement s/p mechanical thrombectomy of superior mesenteric artery that was previously stented per vascular surgery  07/30: Rapid response initiated pt pale; tachypneic; and hypotensive bp 83/48, tachycardic hr 100-104 but alert, CBG in the 300's.  She received 1L IV fluid bolus and transferred to the stepdown unit.  Despite iv fluid resuscitation she remained hypotensive requiring levophed gtt PCCM team consulted.   Interim History / Subjective:    Objective   Blood pressure (!) 95/42, pulse 97, temperature 97.9 F (36.6 C), temperature source Oral, resp. rate (!) 28, height 5\' 7"  (1.702 m), weight 78.9 kg, SpO2 92%.        Intake/Output Summary (Last 24 hours) at 07/20/2023 1830 Last data filed at 07/20/2023 1750 Gross per 24 hour  Intake 1316.04 ml  Output 100 ml  Net 1216.04 ml   Filed Weights   07/16/2023 1007 07/03/2023 1541  Weight: 81.3 kg 78.9 kg    Examination: General: Adult female, acutely ill, lying in bed, NAD HEENT: MM pink/moist, anicteric, atraumatic, neck supple Neuro: A&O x 3, follows  commands, PERRL +3, MAE CV: s1s2 RRR, ST on monitor, no r/m/g Pulm: Regular, non labored on 2 L [place], breath sounds clear-BUL & diminished-BLL GI: soft, rounded/slightly  distended, BUQ tender to light palpation, faint bs  GU: external catheter in place  Skin: no rashes/lesions noted Extremities: warm/dry, pulses + 2 R/P, no edema noted  Resolved Hospital Problem list     Assessment & Plan:  Suspected Sepsis due to suspected intra-abdominal source Circulatory Shock Mild Transaminitis KUB showing possible ileus and air within the left pelvic bowel loop. Lactic: 3.6 > 2.7 > 2.1 > 3.6, Baseline PCT: <0.10 >> 15.65 Initial interventions/workup included: 1 L of NS/LR & Ciprofloxacin/ Metronidazole - Supplemental oxygen as needed, to maintain SpO2 > 90% - f/u cultures, trend lactic/ PCT, trend hepatic function - Daily CBC, monitor WBC/ fever curve - IV antibiotics: ciprofloxacin & flagyl - IVF hydration as needed: additional 250 mL bolus ordered > will start sodium bicarb drip - Continue peripheral vasopressors to maintain MAP< 65: norepinephrine - Strict I/O's: alert provider if UOP < 0.5 mL/kg/hr - NPO - STAT CT angio abdomen/pelvis ordered  Acute on Chronic Mesenteric Ischemia s/p mechanical thrombectomy of SMA POD#1 Patient does not want Eliquis prescription & is documented as being non-compliant with outpatient anti-platelet therapy. SMA stent in place - vascular surgery following appreciate input - continue heparin drip per pharmacy protocol - continue Dilaudid IV PRN for pain control  CAD s/p PCI Ventricular Tachycardia  PMHx: HTN - outpatient anti-hypertensive medications on hold while hypotensive - replete K+, Mg, Ca+ - echocardiogram ordered  - consider amiodarone bolus PRN  Melena Suspected Ileus - monitor H&H closely, Hgb has been stable - f/u CT imaging above, consider surgical consultation PRN  Acute Kidney Injury suspect pre-renal in the setting of hypotension NAGMA- worsening Baseline Cr: 0.76, Cr on admission: 1.74 - Strict I/O's: alert provider if UOP < 0.5 mL/kg/hr - gentle IVF hydration > sodium bicarbonate drip ordered  for overnight - Daily BMP, replace electrolytes PRN - Avoid nephrotoxic agents as able, ensure adequate renal perfusion  Poorly controlled Type 2 Diabetes Mellitus Hemoglobin A1C: 6.7 - Monitor CBG Q 4 hours while NPO - SSI moderate dosing, continue Semglee 45 units at bedtime per diabetes coordinator recommendations  - target range while in ICU: 140-180 - follow ICU hyper/hypo-glycemia protocol - Diabetes Coordinator following appreciate input  Depression/Anxiety Chronic Pain Syndrome - currently NPO, dilaudid IV PRN - hold Lyrica, morphine, xanax until able to tolerate PO  Best Practice (right click and "Reselect all SmartList Selections" daily)  Diet/type: NPO DVT prophylaxis: systemic heparin GI prophylaxis: PPI Lines: N/A Foley:  N/A Code Status:  DNR Last date of multidisciplinary goals of care discussion [06/22/2023]  Labs   CBC: Recent Labs  Lab 06/29/2023 1009 07/20/23 0411  WBC 24.7* 13.5*  NEUTROABS  --  10.1*  HGB 16.3* 15.3*  HCT 48.9* 46.6*  MCV 95.7 96.5  PLT 426* 337    Basic Metabolic Panel: Recent Labs  Lab 07/05/2023 1009 07/20/23 0411  NA 135 138  K 3.6 3.5  CL 99 105  CO2 21* 20*  GLUCOSE 282* 322*  BUN 21 33*  CREATININE 0.76 0.88  CALCIUM 10.1 9.3   GFR: Estimated Creatinine Clearance: 58.8 mL/min (by C-G formula based on SCr of 0.88 mg/dL). Recent Labs  Lab 07/13/2023 1009 07/08/2023 1106 07/02/2023 1917 06/30/2023 2148 07/20/23 0411  WBC 24.7*  --   --   --  13.5*  LATICACIDVEN  --  3.6*  2.7* 2.1*  --     Liver Function Tests: Recent Labs  Lab 07/01/2023 1009  AST 37  ALT 12  ALKPHOS 81  BILITOT 0.6  PROT 7.6  ALBUMIN 4.5   Recent Labs  Lab 06/28/2023 1009  LIPASE 38   No results for input(s): "AMMONIA" in the last 168 hours.  ABG    Component Value Date/Time   PHART 7.32 (L) 03/27/2020 0315   PCO2ART 57 (H) 03/27/2020 0315   PO2ART 84 03/27/2020 0315   HCO3 29.4 (H) 03/27/2020 0315   ACIDBASEDEF 3.2 (H) 07/06/2018 2119    O2SAT 95.4 03/27/2020 0315     Coagulation Profile: Recent Labs  Lab 07/10/2023 1411 07/20/23 0411  INR 1.1 1.3*    Cardiac Enzymes: No results for input(s): "CKTOTAL", "CKMB", "CKMBINDEX", "TROPONINI" in the last 168 hours.  HbA1C: Hgb A1c MFr Bld  Date/Time Value Ref Range Status  04/07/2023 04:56 AM 6.7 (H) 4.8 - 5.6 % Final    Comment:    (NOTE) Pre diabetes:          5.7%-6.4%  Diabetes:              >6.4%  Glycemic control for   <7.0% adults with diabetes   06/12/2022 05:11 AM 7.8 (H) 4.8 - 5.6 % Final    Comment:    (NOTE) Pre diabetes:          5.7%-6.4%  Diabetes:              >6.4%  Glycemic control for   <7.0% adults with diabetes     CBG: Recent Labs  Lab 06/23/2023 2017 07/20/23 0759 07/20/23 1209 07/20/23 1643 07/20/23 1707  GLUCAP 285* 300* 305* 282* 323*    Review of Systems: Positives in BOLD   Gen: Denies fever, chills, weight change, fatigue, night sweats HEENT: Denies blurred vision, double vision, hearing loss, tinnitus, sinus congestion, rhinorrhea, sore throat, neck stiffness, dysphagia PULM: Denies shortness of breath, cough, sputum production, hemoptysis, wheezing CV: Denies chest pain, edema, orthopnea, paroxysmal nocturnal dyspnea, palpitations GI: Denies abdominal pain, distention, nausea, vomiting, diarrhea, hematochezia, melena, constipation, change in bowel habits GU: Denies dysuria, hematuria, polyuria, oliguria, urethral discharge Endocrine: Denies hot or cold intolerance, polyuria, polyphagia or appetite change Derm: Denies rash, dry skin, scaling or peeling skin change Heme: Denies easy bruising, bleeding, bleeding gums Neuro: Denies headache, numbness, weakness, slurred speech, loss of memory or consciousness  Past Medical History:  She,  has a past medical history of Anxiety, Chronic back pain, Collagen vascular disease (HCC), Coronary artery disease, Diabetes mellitus without complication (HCC), Hypertension, Low back  pain, Myocardial infarction (HCC), Osteomyelitis of toe (HCC) (01/23/2016), and Stroke (HCC).   Surgical History:   Past Surgical History:  Procedure Laterality Date   AMPUTATION TOE Left 11/10/2015   Procedure: AMPUTATION TOE;  Surgeon: Linus Galas, MD;  Location: [facility] ORS;  Service: Podiatry;  Laterality: Left;   AMPUTATION TOE Left 01/24/2016   Procedure: AMPUTATION TOE (2nd mpj);  Surgeon: Linus Galas, DPM;  Location: [facility] ORS;  Service: Podiatry;  Laterality: Left;   AMPUTATION TOE Right 12/21/2018   Procedure: 2ND TOE AMPUTATION WITH DEBRIDEMENT OF SOFT TISSUE;  Surgeon: Recardo Evangelist, DPM;  Location: [facility] ORS;  Service: Podiatry;  Laterality: Right;   AMPUTATION TOE Left 06/12/2022   Procedure: AMPUTATION TOE; PARTIAL AMPUTATION LEFT GREAT TOE;  Surgeon: Linus Galas, DPM;  Location: [facility] ORS;  Service: Podiatry;  Laterality: Left;   CARDIAC CATHETERIZATION N/A 11/12/2015   Procedure:  Left Heart Cath and Coronary Angiography;  Surgeon: Marcina Millard, MD;  Location: [facility] INVASIVE CV LAB;  Service: Cardiovascular;  Laterality: N/A;   COLONOSCOPY WITH PROPOFOL N/A 07/20/2017   Procedure: COLONOSCOPY WITH PROPOFOL;  Surgeon: Wyline Mood, MD;  Location: [facility] ENDOSCOPY;  Service: Endoscopy;  Laterality: N/A;   JOINT REPLACEMENT     TOE AMPUTATION     VISCERAL ANGIOGRAPHY N/A 04/05/2023   Procedure: VISCERAL ANGIOGRAPHY;  Surgeon: Annice Needy, MD;  Location: [facility] INVASIVE CV LAB;  Service: Cardiovascular;  Laterality: N/A;   VISCERAL ANGIOGRAPHY N/A 07/09/2023   Procedure: VISCERAL ANGIOGRAPHY;  Surgeon: Annice Needy, MD;  Location: [facility] INVASIVE CV LAB;  Service: Cardiovascular;  Laterality: N/A;     Social History:   reports that she has never smoked. She has never used smokeless tobacco. She reports that she does not drink alcohol and does not use drugs.   Family History:  Her family history includes CAD in her father and mother.   Allergies Allergies  Allergen Reactions    Amoxicillin Diarrhea   Ampicillin Nausea And Vomiting and Other (See Comments)    Did it involve swelling of the face/tongue/throat, SOB, or low BP? Yes Did it involve sudden or severe rash/hives, skin peeling, or any reaction on the inside of your mouth or nose? No Did you need to seek medical attention at a hospital or doctor's office? No When did it last happen? >10 years If all above answers are "NO", may proceed with cephalosporin use.    Augmentin [Amoxicillin-Pot Clavulanate] Diarrhea   Bactrim [Sulfamethoxazole-Trimethoprim] Other (See Comments)    N/V/D   Codeine Nausea And Vomiting   Egg-Derived Products     ALLergic to cook or raw egg only. Food with egg ingredient is okay for patient.    Influenza Vaccines Other (See Comments)    Reaction:  Caused pt to pass out    Influenza Virus Vaccine Other (See Comments)    Reaction: Passed out for 12 days   Methadone Hives and Itching   Oxycodone-Acetaminophen Nausea And Vomiting   Percocet [Oxycodone-Acetaminophen] Nausea And Vomiting   Sulfa Antibiotics Other (See Comments)    Reaction: unknown   Tetanus Antitoxin Swelling and Other (See Comments)    Reaction: injection site swelling   Tetanus Toxoids Swelling and Other (See Comments)    Reaction:  Swelling at injection site   Penicillin G Rash    Has patient had a PCN reaction causing immediate rash, facial/tongue/throat swelling, SOB or lightheadedness with hypotension: Yes Has patient had a PCN reaction causing severe rash involving mucus membranes or skin necrosis: No Has patient had a PCN reaction that required hospitalization: No Has patient had a PCN reaction occurring within the last 10 years: No If all of the above answers are "NO", then may proceed with Cephalosporin use..   Tetracyclines & Related Rash     Home Medications  Prior to Admission medications   Medication Sig Start Date End Date Taking? Authorizing Provider  ACCU-CHEK GUIDE test strip TEST 3 TIMES  DAILY AS DIRECTED 11/30/22   [provider]  acetaminophen (TYLENOL) 500 MG tablet Take 1,000 mg by mouth every 8 (eight) hours as needed for mild pain or headache.     [provider]  ALPRAZolam Prudy Feeler) 0.5 MG tablet Take 0.5 mg by mouth 4 (four) times daily as needed for anxiety. 10/16/22   [provider]  AMBULATORY NON FORMULARY MEDICATION Compounded estrogen  Apply 1 gram vaginally M,W, [facility] 01/28/21   Vanna Scotland, MD  aspirin 81 MG tablet Take 81 mg by mouth daily.    [provider]  atorvastatin (LIPITOR) 20 MG tablet Take 20 mg by mouth daily.     [provider]  Blood Glucose Monitoring Suppl (ONE TOUCH ULTRA 2) w/Device KIT SMARTSIG:1 Via Meter Daily 01/23/23   [provider]  celecoxib (CELEBREX) 200 MG capsule Take 200 mg by mouth daily.    [provider]  clopidogrel (PLAVIX) 75 MG tablet Take 1 tablet (75 mg total) by mouth daily. Patient not taking: Reported on 05/05/2023 04/05/23   Annice Needy, MD  cyclobenzaprine (FLEXERIL) 10 MG tablet Take 1 tablet by mouth 3 (three) times daily. 04/22/23   [provider]  insulin aspart (NOVOLOG) 100 UNIT/ML injection Inject 3-15 Units into the skin 3 (three) times daily with meals as needed for high blood sugar. Pt uses as needed per sliding scale:    Less than 140:  0 units  140-180:  3 units 181-220:  4 units 221- 260:  6 units 261- 320:  8 units 321-360:  10 units 361-400:  12 units Greater than 400:  15 units    [provider]  isosorbide mononitrate (IMDUR) 30 MG 24 hr tablet Take 30 mg by mouth daily. 11/11/20   [provider]  Lancets (ONETOUCH DELICA PLUS [place]) MISC Apply topically. 01/28/23   [provider]  lisinopril (ZESTRIL) 20 MG tablet Take 20 mg by mouth daily. 11/11/20   [provider]  metoprolol tartrate (LOPRESSOR) 25 MG tablet Take 1 tablet (25 mg total) by mouth 2 (two) times  daily. 11/17/20 04/06/23  Jerald Kief, MD  mirtazapine (REMERON) 15 MG tablet Take 15 mg by mouth at bedtime.    [provider]  morphine (MSIR) 15 MG tablet Take 15 mg by mouth 2 (two) times daily. 11/16/22   [provider]  Multiple Vitamins-Minerals (HAIR SKIN AND NAILS FORMULA PO) Take by mouth daily. 2 gummies daily    [provider]  omeprazole (PRILOSEC) 40 MG capsule Take 1 capsule (40 mg total) by mouth daily. 06/24/22   Darlin Priestly, MD  polyvinyl alcohol (LIQUIFILM TEARS) 1.4 % ophthalmic solution Place 1 drop into the right eye 4 (four) times daily. 11/18/22   Arnetha Courser, MD  TOUJEO SOLOSTAR 300 UNIT/ML SOPN Inject 60 Units into the skin every morning. 08/08/19   [provider]  traZODone (DESYREL) 100 MG tablet Take 0.5 tablets (50 mg total) by mouth at bedtime as needed for sleep. 04/07/23   Sunnie Nielsen, DO  venlafaxine XR (EFFEXOR-XR) 75 MG 24 hr capsule Take 75 mg by mouth 2 (two) times daily.    [provider]  Vitamin D, Ergocalciferol, (DRISDOL) 50000 units CAPS capsule Take 50,000 Units by mouth every Sunday.    [provider]     Critical care time: 68 minutes       Betsey Holiday, AGACNP-BC Acute Care Nurse Practitioner [facility]   [phone] / [phone] Please see Amion for details.

## 2023-07-20 NOTE — Plan of Care (Signed)
  Problem: Education: Goal: Knowledge of General Education information will improve Description: Including pain rating scale, medication(s)/side effects and non-pharmacologic comfort measures Outcome: Progressing   Problem: Health Behavior/Discharge Planning: Goal: Ability to manage health-related needs will improve Outcome: Progressing   Problem: Clinical Measurements: Goal: Ability to maintain clinical measurements within normal limits will improve Outcome: Progressing Goal: Will remain free from infection Outcome: Progressing Goal: Diagnostic test results will improve Outcome: Progressing Goal: Respiratory complications will improve Outcome: Progressing Goal: Cardiovascular complication will be avoided Outcome: Progressing   Problem: Activity: Goal: Risk for activity intolerance will decrease Outcome: Progressing   Problem: Nutrition: Goal: Adequate nutrition will be maintained Outcome: Progressing   Problem: Coping: Goal: Level of anxiety will decrease Outcome: Progressing   Problem: Elimination: Goal: Will not experience complications related to bowel motility Outcome: Progressing Goal: Will not experience complications related to urinary retention Outcome: Progressing   Problem: Safety: Goal: Ability to remain free from injury will improve Outcome: Progressing   Problem: Skin Integrity: Goal: Risk for impaired skin integrity will decrease Outcome: Progressing   Problem: Education: Goal: Ability to describe self-care measures that may prevent or decrease complications (Diabetes Survival Skills Education) will improve Outcome: Progressing Goal: Individualized Educational Video(s) Outcome: Progressing   Problem: Coping: Goal: Ability to adjust to condition or change in health will improve Outcome: Progressing   Problem: Fluid Volume: Goal: Ability to maintain a balanced intake and output will improve Outcome: Progressing   Problem: Health  Behavior/Discharge Planning: Goal: Ability to identify and utilize available resources and services will improve Outcome: Progressing Goal: Ability to manage health-related needs will improve Outcome: Progressing   Problem: Metabolic: Goal: Ability to maintain appropriate glucose levels will improve Outcome: Progressing   Problem: Nutritional: Goal: Maintenance of adequate nutrition will improve Outcome: Progressing Goal: Progress toward achieving an optimal weight will improve Outcome: Progressing   Problem: Skin Integrity: Goal: Risk for impaired skin integrity will decrease Outcome: Progressing   Problem: Tissue Perfusion: Goal: Adequacy of tissue perfusion will improve Outcome: Progressing   

## 2023-07-20 NOTE — Progress Notes (Addendum)
Rapid response was called with pt having low bp 61/44 and appears clammy. NO focal weakness. Speech clear. Pt able to speak clearly HR 95, sats 95% on 2 liter/min Started IVF bolus. Bp remains low Will mover her to ICU and cont IVF and start Levophed if bp remains low.  Spoke with [employer] May  Unable to reach friend Tobi Bastos green  Critical time 35 mins

## 2023-07-21 ENCOUNTER — Inpatient Hospital Stay: Payer: Medicare HMO | Admitting: Anesthesiology

## 2023-07-21 ENCOUNTER — Inpatient Hospital Stay: Payer: Medicare HMO

## 2023-07-21 ENCOUNTER — Inpatient Hospital Stay (HOSPITAL_COMMUNITY)
Admit: 2023-07-21 | Discharge: 2023-07-21 | Disposition: A | Discharge status: Home / Self Care | Payer: Medicare HMO | Attending: Pulmonary Disease | Admitting: Pulmonary Disease

## 2023-07-21 ENCOUNTER — Encounter: Admission: EM | Disposition: E | Payer: Self-pay | Source: Home / Self Care | Attending: Internal Medicine

## 2023-07-21 DIAGNOSIS — I10 Essential (primary) hypertension: Secondary | ICD-10-CM

## 2023-07-21 DIAGNOSIS — G928 Other toxic encephalopathy: Secondary | ICD-10-CM

## 2023-07-21 DIAGNOSIS — N179 Acute kidney failure, unspecified: Secondary | ICD-10-CM

## 2023-07-21 DIAGNOSIS — K55059 Acute (reversible) ischemia of intestine, part and extent unspecified: Secondary | ICD-10-CM | POA: Diagnosis not present

## 2023-07-21 DIAGNOSIS — K6389 Other specified diseases of intestine: Secondary | ICD-10-CM | POA: Insufficient documentation

## 2023-07-21 DIAGNOSIS — I472 Ventricular tachycardia, unspecified: Secondary | ICD-10-CM | POA: Insufficient documentation

## 2023-07-21 DIAGNOSIS — A419 Sepsis, unspecified organism: Principal | ICD-10-CM

## 2023-07-21 DIAGNOSIS — R579 Shock, unspecified: Secondary | ICD-10-CM | POA: Diagnosis not present

## 2023-07-21 DIAGNOSIS — E872 Acidosis, unspecified: Secondary | ICD-10-CM | POA: Insufficient documentation

## 2023-07-21 DIAGNOSIS — K567 Ileus, unspecified: Secondary | ICD-10-CM | POA: Insufficient documentation

## 2023-07-21 DIAGNOSIS — K559 Vascular disorder of intestine, unspecified: Secondary | ICD-10-CM | POA: Insufficient documentation

## 2023-07-21 DIAGNOSIS — I4891 Unspecified atrial fibrillation: Secondary | ICD-10-CM | POA: Insufficient documentation

## 2023-07-21 DIAGNOSIS — E119 Type 2 diabetes mellitus without complications: Secondary | ICD-10-CM | POA: Diagnosis not present

## 2023-07-21 DIAGNOSIS — R7401 Elevation of levels of liver transaminase levels: Secondary | ICD-10-CM | POA: Insufficient documentation

## 2023-07-21 DIAGNOSIS — K55069 Acute infarction of intestine, part and extent unspecified: Secondary | ICD-10-CM | POA: Diagnosis not present

## 2023-07-21 DIAGNOSIS — R6521 Severe sepsis with septic shock: Secondary | ICD-10-CM | POA: Diagnosis not present

## 2023-07-21 LAB — T4, FREE: Free T4: 0.96 ng/dL (ref 0.61–1.12)

## 2023-07-21 LAB — GLUCOSE, CAPILLARY
Glucose-Capillary: 199 mg/dL — ABNORMAL HIGH (ref 70–99)
Glucose-Capillary: 191 mg/dL — ABNORMAL HIGH (ref 70–99)
Glucose-Capillary: 135 mg/dL — ABNORMAL HIGH (ref 70–99)
Glucose-Capillary: 85 mg/dL (ref 70–99)

## 2023-07-21 LAB — TSH: TSH: 1.171 u[IU]/mL (ref 0.350–4.500)

## 2023-07-21 LAB — BLOOD GAS, ARTERIAL
Acid-base deficit: 2.4 mmol/L — ABNORMAL HIGH (ref 0.0–2.0)
Bicarbonate: 21.6 mmol/L (ref 20.0–28.0)
O2 Content: 4 L/min
O2 Saturation: 98 %
Patient temperature: 37
pCO2 arterial: 34 mmHg (ref 32–48)
pH, Arterial: 7.41 (ref 7.35–7.45)
pO2, Arterial: 79 mmHg — ABNORMAL LOW (ref 83–108)

## 2023-07-21 LAB — LACTIC ACID, PLASMA
Lactic Acid, Venous: 4.1 mmol/L (ref 0.5–1.9)
Lactic Acid, Venous: 4.3 mmol/L (ref 0.5–1.9)

## 2023-07-21 SURGERY — EXPLORATORY LAPAROTOMY
Anesthesia: General

## 2023-07-21 MED ORDER — HALOPERIDOL LACTATE 5 MG/ML IJ SOLN: 2.5000 mg | INTRAMUSCULAR | Status: DC | PRN

## 2023-07-21 MED ORDER — POTASSIUM CHLORIDE 10 MEQ/100ML IV SOLN
10.0000 meq | Freq: Once | INTRAVENOUS | Status: DC
  Filled 2023-07-21: qty 100

## 2023-07-21 MED ORDER — MIDAZOLAM HCL 2 MG/2ML IJ SOLN
2.0000 mg | INTRAMUSCULAR | Status: DC | PRN
  Administered 2023-07-21 (×2): 2 mg via INTRAVENOUS
  Filled 2023-07-21 (×2): qty 2

## 2023-07-21 MED ORDER — HYDROMORPHONE BOLUS VIA INFUSION
1.0000 mg | INTRAVENOUS | Status: DC | PRN
  Administered 2023-07-21 (×6): 1 mg via INTRAVENOUS

## 2023-07-21 MED ORDER — HEPARIN (PORCINE) 25000 UT/250ML-% IV SOLN
1050.0000 [IU]/h | INTRAVENOUS | Status: DC
  Administered 2023-07-21: 1050 [IU]/h via INTRAVENOUS

## 2023-07-21 MED ORDER — GLYCOPYRROLATE 0.2 MG/ML IJ SOLN: 0.2000 mg | INTRAMUSCULAR | Status: DC | PRN

## 2023-07-21 MED ORDER — POLYVINYL ALCOHOL 1.4 % OP SOLN: 1.0000 [drp] | Freq: Four times a day (QID) | OPHTHALMIC | Status: DC | PRN

## 2023-07-21 MED ORDER — FENTANYL BOLUS VIA INFUSION
100.0000 ug | INTRAVENOUS | Status: DC | PRN
  Administered 2023-07-21 (×4): 100 ug via INTRAVENOUS

## 2023-07-21 MED ORDER — HYDROMORPHONE HCL-NACL 50-0.9 MG/50ML-% IV SOLN
0.0000 mg/h | INTRAVENOUS | Status: DC
  Administered 2023-07-21: 1 mg/h via INTRAVENOUS
  Filled 2023-07-21: qty 50

## 2023-07-21 MED ORDER — LORAZEPAM 2 MG/ML IJ SOLN
2.0000 mg | INTRAMUSCULAR | Status: DC | PRN
  Administered 2023-07-21: 2 mg via INTRAVENOUS
  Filled 2023-07-21: qty 1

## 2023-07-21 MED ORDER — FENTANYL 2500MCG IN NS 250ML (10MCG/ML) PREMIX INFUSION
0.0000 ug/h | INTRAVENOUS | Status: DC
  Administered 2023-07-21: 50 ug/h via INTRAVENOUS
  Filled 2023-07-21: qty 250

## 2023-07-21 MED ORDER — BUPIVACAINE-EPINEPHRINE (PF) 0.25% -1:200000 IJ SOLN
INTRAMUSCULAR | Status: AC
  Filled 2023-07-21: qty 30

## 2023-07-21 MED ORDER — LORAZEPAM 2 MG/ML IJ SOLN
1.0000 mg | Freq: Once | INTRAMUSCULAR | Status: AC
  Administered 2023-07-21: 1 mg via INTRAVENOUS

## 2023-07-21 MED ORDER — GLYCOPYRROLATE 1 MG PO TABS: 1.0000 mg | ORAL_TABLET | ORAL | Status: DC | PRN

## 2023-07-21 MED ORDER — LORAZEPAM 2 MG/ML IJ SOLN
INTRAMUSCULAR | Status: AC
  Filled 2023-07-21: qty 1

## 2023-07-21 MED ORDER — SODIUM CHLORIDE 0.9 % IV SOLN: INTRAVENOUS | Status: DC

## 2023-07-21 SURGICAL SUPPLY — 32 items
APL PRP STRL LF DISP 70% ISPRP (MISCELLANEOUS) ×1
APPLIER CLIP 11 MED OPEN (CLIP)
APPLIER CLIP 13 LRG OPEN (CLIP)
APR CLP LRG 13 20 CLIP (CLIP)
APR CLP MED 11 20 MLT OPN (CLIP)
BLADE CLIPPER SURG (BLADE)
CHLORAPREP W/TINT 26 (MISCELLANEOUS) ×1
COVER BACK TABLE REUSABLE LG (DRAPES) ×1
DRAPE C-SECTION (MISCELLANEOUS) ×1
ELECT BLADE 6.5 EXT (BLADE) ×1
ELECT CAUTERY BLADE 6.4 (BLADE) ×1
ELECT REM PT RETURN 9FT ADLT (ELECTROSURGICAL) ×1
GAUZE 4X4 16PLY ~~LOC~~+RFID DBL (SPONGE) ×1
GLOVE ORTHO TXT STRL SZ7.5 (GLOVE) ×2
GOWN STRL REUS W/ TWL LRG LVL3 (GOWN DISPOSABLE) ×2
GOWN STRL REUS W/ TWL XL LVL3 (GOWN DISPOSABLE) ×2
GOWN STRL REUS W/TWL LRG LVL3 (GOWN DISPOSABLE) ×2
GOWN STRL REUS W/TWL XL LVL3 (GOWN DISPOSABLE) ×2
HANDLE YANKAUER SUCT BULB TIP (MISCELLANEOUS)
LIGASURE IMPACT 36 18CM CVD LR (INSTRUMENTS)
MANIFOLD NEPTUNE II (INSTRUMENTS) ×1
NS IRRIG 1000ML POUR BTL (IV SOLUTION) ×1
PACK BASIN MAJOR ARMC (MISCELLANEOUS) ×1
PACK COLON CLEAN CLOSURE (MISCELLANEOUS)
SPONGE T-LAP 18X18 ~~LOC~~+RFID (SPONGE) ×4
STAPLER SKIN PROX 35W (STAPLE) ×1
SUT PDS AB 1 CT 36 (SUTURE) ×3
SUT SILK 3-0 (SUTURE) ×1
SUT VICRYL 2-0 54IN ABS (SUTURE)
TRAP FLUID SMOKE EVACUATOR (MISCELLANEOUS) ×1
TRAY FOLEY MTR SLVR 16FR STAT (SET/KITS/TRAYS/PACK) ×1
WATER STERILE IRR 500ML POUR (IV SOLUTION) ×1

## 2023-07-22 NOTE — Anesthesia Preprocedure Evaluation (Deleted)
Anesthesia Evaluation   Patient confused    Airway        Dental   Pulmonary sleep apnea           Cardiovascular hypertension, + CAD (s/p MI and stents on Plavix) and + Peripheral Vascular Disease (s/p SMA stent on heparin infusion)    ECG 07/20/23:  Atrial fibrillation Ventricular tachycardia, unsustained LVH with secondary repolarization abnormality Probable inferior infarct, acute Anterolateral infarct, old 12 Lead; Mason-Likar >>> Acute MI <<<   Neuro/Psych  PSYCHIATRIC DISORDERS Anxiety Depression   Dementia Chronic back pain; neuropathy CVA (residual left leg weakness)    GI/Hepatic   Endo/Other  diabetes, Type 2, Insulin Dependent    Renal/GU ARFRenal disease     Musculoskeletal  (+) Arthritis ,    Abdominal   Peds  Hematology  (+) Blood dyscrasia, anemia   Anesthesia Other Findings   Reproductive/Obstetrics                             Anesthesia Physical Anesthesia Plan  ASA: 5 and emergent  Anesthesia Plan: General   Post-op Pain Management:    Induction:   PONV Risk Score and Plan:   Airway Management Planned:   Additional Equipment:   Intra-op Plan:   Post-operative Plan:   Informed Consent:   Plan Discussed with:   Anesthesia Plan Comments: (Patient with extremely poor status and prognosis: sepsis, shock, mesenteric ischemia s/p SMA thrombectomy and stent this admission, followed by V. Tach, now in a fib with RVR, increasingly delirious, not consentable, on multiple infusions (amiodarone, norepinephrine, phenylephrine, bicarb, heparin).  She is not likely to survive this operation, and if she does, would have to remain intubated postoperatively.  She is DNR and no family or other contacts are reachable.  I am concerned about the ethics of proceeding to the OR given all above.  Discussed at length with ICU PA and Dr. Claudine Mouton.  We called Miki Kins, an  ethics committee member on call tonight.  Ms. Joretta Bachelor understands the ethical dilemma and would like to discuss with the committee first thing in the morning.  ICU staff continues to try to reach friends and family for consent.  Dr. Claudine Mouton and I agree that proceeding to the OR is futile and unethical; we will hold off on surgery unless family or ethics committee instruct [place] to proceed.   )        Anesthesia Quick Evaluation

## 2023-07-22 NOTE — Consult Note (Addendum)
ANTICOAGULATION CONSULT NOTE   Pharmacy Consult for heparin infusion Indication: mesenteric ischemia  Allergies  Allergen Reactions   Amoxicillin Diarrhea   Ampicillin Nausea And Vomiting and Other (See Comments)    Did it involve swelling of the face/tongue/throat, SOB, or low BP? Yes Did it involve sudden or severe rash/hives, skin peeling, or any reaction on the inside of your mouth or nose? No Did you need to seek medical attention at a hospital or doctor's office? No When did it last happen? >10 years If all above answers are "NO", may proceed with cephalosporin use.    Augmentin [Amoxicillin-Pot Clavulanate] Diarrhea   Bactrim [Sulfamethoxazole-Trimethoprim] Other (See Comments)    N/V/D   Codeine Nausea And Vomiting   Egg-Derived Products     ALLergic to cook or raw egg only. Food with egg ingredient is okay for patient.    Influenza Vaccines Other (See Comments)    Reaction:  Caused pt to pass out    Influenza Virus Vaccine Other (See Comments)    Reaction: Passed out for 12 days   Methadone Hives and Itching   Oxycodone-Acetaminophen Nausea And Vomiting   Percocet [Oxycodone-Acetaminophen] Nausea And Vomiting   Sulfa Antibiotics Other (See Comments)    Reaction: unknown   Tetanus Antitoxin Swelling and Other (See Comments)    Reaction: injection site swelling   Tetanus Toxoids Swelling and Other (See Comments)    Reaction:  Swelling at injection site   Penicillin G Rash    Has patient had a PCN reaction causing immediate rash, facial/tongue/throat swelling, SOB or lightheadedness with hypotension: Yes Has patient had a PCN reaction causing severe rash involving mucus membranes or skin necrosis: No Has patient had a PCN reaction that required hospitalization: No Has patient had a PCN reaction occurring within the last 10 years: No If all of the above answers are "NO", then may proceed with Cephalosporin use..   Tetracyclines & Related Rash    Patient  Measurements: Height: 5\' 7"  (170.2 cm) Weight: 78.9 kg (174 lb) IBW/kg (Calculated) : 61.6 Heparin Dosing Weight: 78.3 kg  Vital Signs: Temp: 98.7 F (37.1 C) (07/31 0000) Temp Source: Axillary (07/31 0000) BP: 95/75 (07/31 0215) Pulse Rate: 125 (07/31 0215)  Labs: Recent Labs    07/10/2023 1009 07/07/2023 1411 07/20/23 0411 07/20/23 1650 07/20/23 1908 07/14/2023 0216  HGB 16.3*  --  15.3*  --  13.0 12.7  HCT 48.9*  --  46.6*  --  39.3 38.3  PLT 426*  --  337  --  321 289  APTT  --  26  --   --  150*  --   LABPROT  --  14.6 16.0*  --  18.6*  --   INR  --  1.1 1.3*  --  1.5*  --   HEPARINUNFRC  --   --   --  1.01*  --  0.32  CREATININE 0.76  --  0.88  --  1.74*  --     Estimated Creatinine Clearance: 29.7 mL/min (A) (by C-G formula based on SCr of 1.74 mg/dL (H)).   Medical History: Past Medical History:  Diagnosis Date   Anxiety    Chronic back pain    Collagen vascular disease (HCC)    Coronary artery disease    a. 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  Assessment: 77 yo female presented to the ED with abdominal pain.  Patient has a history for mesenteric ischemia with stenting.  Pharmacy consulted to initiate heparin infusion.  Vascula procedure performed 7/30. Per Dr Wyn Quaker, please continue heparin until 7/31, then transition to Eliquis. May need to transition to ASA + Plavix instead due to cost (MD to discuss with patient).  Goal of Therapy:  Heparin level 0.3-0.7 units/ml Monitor platelets by anticoagulation protocol: Yes   07/30 1650 HL 1.01, supratherapeutic 07/31 0216 HL 0.32, therapeutic X 1 (heparin drip held for ~ 30 min)   Plan:  7/31: HL @ 0216 = 0.32, therapeutic X 1 ,   heparin drip was paused for about 30 min for scan  Continue heparin infusion at 1050 units/hr - no additional HL ordered b/c heparin will d/c @ 1000  Heparin infusion scheduled to stop 7/31 @ 1000 and begin Eliquis Continue to monitor H&H and platelets  - NOTE:  RN stated @ 0300 that heparin gtt was going to be paused b/c pt going for exploratory laparotomy  Dewaun Kinzler D 07/12/2023 2:46 AM

## 2023-07-22 NOTE — Progress Notes (Signed)
Patient transitioned to comfort care. Fentanyl drip started per order. All existing drips stopped as per order.

## 2023-07-22 NOTE — IPAL (Signed)
  Interdisciplinary Goals of Care Family Meeting   Date carried out: 07/13/2023  Location of the meeting: Bedside  Member's involved: Physician and Family Member or next of kin  Durable Power of Attorney or acting medical decision maker: POA Alycia Rossetti    Discussion: We discussed goals of care for Jessica Donovan .  Discussed Ms Boehlke' medical condition with her POA, Alycia Rossetti, and explained her overall clinical deterioration. Patient in metabolic acidosis and septic/circulatory shock secondary to do dead gutt. Ms. Neva Seat explained that patient's wishes were not to have any aggressive interventions or life saving measures. She would not want any surgeries of any sort. Given patient's wishes, and current discomfort and overall clinical deterioration, Ms. Neva Seat would like to transition Ms. Winecoff to comfort measures only in accordance with her wishes. Orders placed and RN updated.  Code status:   Code Status: DNR   Disposition: In-patient comfort care  Time spent for the meeting: 15 minutes    Raechel Chute, MD  07/01/2023, 2:09 PM

## 2023-07-22 NOTE — Progress Notes (Signed)
PT Cancellation Note  Patient Details Name: Jessica Donovan MRN: [id number] DOB: 06/29/1946   Cancelled Treatment:    Reason Eval/Treat Not Completed: Other (comment) Patient with transfer to ICU on 7/30. Will need new orders for PT prior to seeing. PT will await new orders before proceeding.  Maylon Peppers, PT, DPT Physical Therapist - [place]  [facility]    Jessica Donovan 07/05/2023, 9:12 AM

## 2023-07-22 NOTE — Progress Notes (Signed)
Patient placed on comfort care today. Patient appears to be resting comfortably in room. Pain/comfort being managed with dilaudid drip. Will continue to check for patient needs during this shift.  Carmel Sacramento, RN

## 2023-07-22 NOTE — Progress Notes (Signed)
Patient's gold-colored necklace, gold-colored pair of earrings, and upper dentures removed in preparation for surgery. Placed in labeled cup on the shelf of ICU room 7. Patient glasses and cell phone also placed on shelf. Asher Muir, RN, present during removal of personal belongings.  Carmel Sacramento, RN

## 2023-07-22 NOTE — Progress Notes (Signed)
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  She presented to [facility] ER on 07/29 via EMS from home with a 2 week hx of worsening abdominal pain.  Per ER notes she also endorsed nausea, vomiting, chills, and diarrhea.  She also had 1 episode of bloody diarrhea.     ED Course Upon arrival to the ER significant lab results were: glucose 282/wbc 24.7/platelets 426/lactic acid 3.6.  She received iv fluids, morphine, and zofran.  CTA Abd Pelvis revealed SMA occlusion of prior stent placement.  Vascular Surgery consulted by EDP, following evaluation proceeded with mechanical thrombectomy of superior mesenteric artery that was previously stented.  Pt admitted to the progressive care unit per hospitalist for additional workup and treatment.  See detailed hospital course below under significant events    CTA Abd/Pelvis W&WO Contrast: Interval stenting of the origin and proximal aspect of the SMA with occlusion of the stent and early reconstitution of the SMA via hypertrophied collateral supply from the celiac and IMA. Moderate amount of mixed calcified and noncalcified atherosclerotic plaque involving the origin of the celiac artery approaches 50% luminal narrowing. Aortic Atherosclerosis (ICD10-I70.0). Circumferential wall thickening involving the descending and sigmoid colon without evidence of pneumatosis or portal venous gas, nonspecific though given SMA stent occlusion, early ischemic changes are not excluded on the basis of this examination. Correlation with lactate levels could be performed as indicated. Mild nodularity of the hepatic contour with partial recanalization of  the periumbilical vein, nonspecific though could be seen in the setting of early cirrhosis. Correlation with LFTs is advised.   KUB 07/20/23: Moderate air distension of the stomach and bowel, possible ileus. Appearance suggesting possible intramural air within the left pelvic bowel loop, CT suggested for further assessment.  Pertinent  Medical History  Anxiety Chronic Back Pain  Collagen Vascular Disease  CAD  Type II Diabetes Mellitus  HTN  MI  Osteomyelitis  Stroke   Significant Hospital Events: Including procedures, antibiotic start and stop dates in addition to other pertinent events   07/20/2023: admit to ICU, disoriented. CT abdomen with mesenteric ischemia 07/07/2023: remains disoriented, patient's friend contacted, awaiting presentation to bedside for goals of care conversation  Interim History / Subjective:  Patient oriented to place only. Unable to answer questions. Reports severe abdominal pain to palpation.  Objective   Blood pressure (!) 104/53, pulse (!) 125, temperature 98.8 F (37.1 C), temperature source Oral, resp. rate (!) 30, height 5\' 7"  (1.702 m), weight 85.6 kg, SpO2 93%.        Intake/Output Summary (Last 24 hours) at 07/17/2023 0820 Last data filed at 06/21/2023 0748 Gross per 24 hour  Intake 4440.57 ml  Output 700 ml  Net 3740.57 ml   Filed Weights   07/06/2023 1007 07/03/2023 1541 07/08/2023 0310  Weight: 81.3 kg 78.9 kg 85.6 kg    Examination: Physical Exam Constitutional:      General: She is in acute distress.     Appearance: She is obese. She is ill-appearing.  HENT:     Mouth/Throat:     Mouth: Mucous membranes are moist.  Cardiovascular:     Rate and Rhythm: Tachycardia present. Rhythm irregular.  Pulses: Normal pulses.     Heart sounds: Normal heart sounds.  Pulmonary:     Breath sounds: Normal breath sounds. No rales.  Abdominal:     General: There is distension.     Tenderness: There is abdominal tenderness.  Musculoskeletal:      Right lower leg: No edema.     Left lower leg: No edema.  Neurological:     Mental Status: She is disoriented.     Assessment & Plan:   77 year old female presenting to the hospital with abdominal pain, found to have acute on chronic mesenteric ischemia s/p mechanical thrombectomy of the SMA with vascular surgery. Decompensated overnight with severe hypotension and shock secondary to mesenteric ischemia.  Neurology #Toxic Metabolic Encephalopathy  In the setting of metabolic acidosis, pain medication administration, and mesenteric ischemia. Patient is unable to make any decisions at this moment and we await her friend who will help make decisions for her. Patient is estranged from her family and has no POA assigned, but did indicate prior to decompensation that she would like friend to make decisions for her.   -continue dilaudid for pain control -goals of care with family member at the bedside.  Cardiovascular #Circulatory Shock #Acute Mesenteric Ischemia #CAD #Ventricular Tachycardia  Circulatory shock in the setting mesenteric ischemia (in the setting of outpatient non-compliance with anti-platelet therapy). This has resulted in a rise of her lactic acid due to hypoperfusion. She's hypotensive and requiring vasopressor support, switched to phenylephrine overnight due to afib with RVR. Patient also had runs of Vtach overnight secondary to electrolyte shifts. She's started on amiodarone with improvement. She is DNR at this point and following phone conversation with patient's friend, we will not escalate care at this point and revisit goals of care on her arrival at the bedside. Patient's friend is indicating that transition of goals of care would be consistent with the patient's wishes.  -continue vasopressors with goal MAP of > 65 -continue amiodarone gtt   Pulmonary  No active issues at this point, continue with oxygen via nasal cannula.  Gastrointestinal #Mesenteric  ischemia  Appreciate input from vascular and general surgery. Patient with mesenteric ischemia and dead gutt, for which resection would be the ultimate treatment. This is not inline with the patient's previously described wishes to her friend. Awaiting friend's presentation to the bedside for goals of care conversations and transition of goals of care.  -NPO  Renal #AKI #Metabolic Acidosis  Secondary to shock and contrast administration for previous imaging. Started sodium bicarbonate infusion given metabolic acidosis and AKI. Holding off on renal consult pending goals of care conversation.  Endocrine #T2DM  Continue basal bolus regimen for glycemic control.  Hem/Onc  Continue heparin gtt for history of SMA thrombosis s/p stenting and thrombectomy.  ID #Mesenteric Ischemia  Continue with broad spectrum antibiotics.   Best Practice (right click and "Reselect all SmartList Selections" daily)   Diet/type: NPO DVT prophylaxis: systemic heparin GI prophylaxis: PPI Lines: N/A Foley:  Yes, and it is still needed Code Status:  DNR Last date of multidisciplinary goals of care discussion [07/04/2023]  Labs   CBC: Recent Labs  Lab 07/07/2023 1009 07/20/23 0411 07/20/23 1908 07/01/2023 0216  WBC 24.7* 13.5* 14.5* 14.4*  NEUTROABS  --  10.1* 10.1*  --   HGB 16.3* 15.3* 13.0 12.7  HCT 48.9* 46.6* 39.3 38.3  MCV 95.7 96.5 96.8 96.2  PLT 426* 337 321 289    Basic Metabolic Panel: Recent Labs  Lab 06/28/2023  1009 07/20/23 0411 07/20/23 1908 07/20/23 2148 07/16/2023 0446  NA 135 138 133*  --  131*  K 3.6 3.5 3.4* 3.2* 3.6  CL 99 105 104  --  98  CO2 21* 20* 17*  --  21*  GLUCOSE 282* 322* 233*  --  200*  BUN 21 33* 51*  --  54*  CREATININE 0.76 0.88 1.74*  --  1.94*  CALCIUM 10.1 9.3 8.6*  --  8.2*  MG  --   --   --  1.5* 2.5*  PHOS  --   --   --  2.0* 3.4   GFR: Estimated Creatinine Clearance: 27.7 mL/min (A) (by C-G formula based on SCr of 1.94 mg/dL (H)). Recent Labs   Lab 06/25/2023 1009 07/12/2023 1106 07/20/23 0411 07/20/23 1908 07/20/23 2148 06/30/2023 0216 07/20/2023 0446  PROCALCITON  --   --   --  15.65  --   --   --   WBC 24.7*  --  13.5* 14.5*  --  14.4*  --   LATICACIDVEN  --    < >  --  3.6* 5.1* 4.1* 4.3*   < > = values in this interval not displayed.    Liver Function Tests: Recent Labs  Lab 06/26/2023 1009 07/20/23 1908  AST 37 85*  ALT 12 32  ALKPHOS 81 41  BILITOT 0.6 0.4  PROT 7.6 5.3*  ALBUMIN 4.5 2.7*   Recent Labs  Lab 07/18/2023 1009  LIPASE 38   No results for input(s): "AMMONIA" in the last 168 hours.  ABG    Component Value Date/Time   PHART 7.41 07/20/2023 0136   PCO2ART 34 06/25/2023 0136   PO2ART 79 (L) 06/23/2023 0136   HCO3 21.6 07/07/2023 0136   ACIDBASEDEF 2.4 (H) 07/15/2023 0136   O2SAT 98 07/08/2023 0136     Coagulation Profile: Recent Labs  Lab 07/06/2023 1411 07/20/23 0411 07/20/23 1908  INR 1.1 1.3* 1.5*    Cardiac Enzymes: No results for input(s): "CKTOTAL", "CKMB", "CKMBINDEX", "TROPONINI" in the last 168 hours.  HbA1C: Hgb A1c MFr Bld  Date/Time Value Ref Range Status  04/07/2023 04:56 AM 6.7 (H) 4.8 - 5.6 % Final    Comment:    (NOTE) Pre diabetes:          5.7%-6.4%  Diabetes:              >6.4%  Glycemic control for   <7.0% adults with diabetes   06/12/2022 05:11 AM 7.8 (H) 4.8 - 5.6 % Final    Comment:    (NOTE) Pre diabetes:          5.7%-6.4%  Diabetes:              >6.4%  Glycemic control for   <7.0% adults with diabetes     CBG: Recent Labs  Lab 07/20/23 1737 07/20/23 2049 07/20/23 2333 07/16/2023 0310 07/17/2023 0722  GLUCAP 306* 214* 202* 199* 191*    Review of Systems:   Unable to obtain  Past Medical History:  She,  has a past medical history of Anxiety, Chronic back pain, Collagen vascular disease (HCC), Coronary artery disease, Diabetes mellitus without complication (HCC), Hypertension, Low back pain, Myocardial infarction (HCC), Osteomyelitis of toe  (HCC) (01/23/2016), and Stroke (HCC).   Surgical History:   Past Surgical History:  Procedure Laterality Date   AMPUTATION TOE Left 11/10/2015   Procedure: AMPUTATION TOE;  Surgeon: Linus Galas, MD;  Location: [facility] ORS;  Service: Podiatry;  Laterality: Left;  AMPUTATION TOE Left 01/24/2016   Procedure: AMPUTATION TOE (2nd mpj);  Surgeon: Linus Galas, DPM;  Location: [facility] ORS;  Service: Podiatry;  Laterality: Left;   AMPUTATION TOE Right 12/21/2018   Procedure: 2ND TOE AMPUTATION WITH DEBRIDEMENT OF SOFT TISSUE;  Surgeon: Recardo Evangelist, DPM;  Location: [facility] ORS;  Service: Podiatry;  Laterality: Right;   AMPUTATION TOE Left 06/12/2022   Procedure: AMPUTATION TOE; PARTIAL AMPUTATION LEFT GREAT TOE;  Surgeon: Linus Galas, DPM;  Location: [facility] ORS;  Service: Podiatry;  Laterality: Left;   CARDIAC CATHETERIZATION N/A 11/12/2015   Procedure: Left Heart Cath and Coronary Angiography;  Surgeon: Marcina Millard, MD;  Location: [facility] INVASIVE CV LAB;  Service: Cardiovascular;  Laterality: N/A;   COLONOSCOPY WITH PROPOFOL N/A 07/20/2017   Procedure: COLONOSCOPY WITH PROPOFOL;  Surgeon: Wyline Mood, MD;  Location: [facility] ENDOSCOPY;  Service: Endoscopy;  Laterality: N/A;   JOINT REPLACEMENT     TOE AMPUTATION     VISCERAL ANGIOGRAPHY N/A 04/05/2023   Procedure: VISCERAL ANGIOGRAPHY;  Surgeon: Annice Needy, MD;  Location: [facility] INVASIVE CV LAB;  Service: Cardiovascular;  Laterality: N/A;   VISCERAL ANGIOGRAPHY N/A 07/18/2023   Procedure: VISCERAL ANGIOGRAPHY;  Surgeon: Annice Needy, MD;  Location: [facility] INVASIVE CV LAB;  Service: Cardiovascular;  Laterality: N/A;     Social History:   reports that she has never smoked. She has never used smokeless tobacco. She reports that she does not drink alcohol and does not use drugs.   Family History:  Her family history includes CAD in her father and mother.   Allergies Allergies  Allergen Reactions   Amoxicillin Diarrhea   Ampicillin Nausea And Vomiting and Other  (See Comments)    Did it involve swelling of the face/tongue/throat, SOB, or low BP? Yes Did it involve sudden or severe rash/hives, skin peeling, or any reaction on the inside of your mouth or nose? No Did you need to seek medical attention at a hospital or doctor's office? No When did it last happen? >10 years If all above answers are "NO", may proceed with cephalosporin use.    Augmentin [Amoxicillin-Pot Clavulanate] Diarrhea   Bactrim [Sulfamethoxazole-Trimethoprim] Other (See Comments)    N/V/D   Codeine Nausea And Vomiting   Egg-Derived Products     ALLergic to cook or raw egg only. Food with egg ingredient is okay for patient.    Influenza Vaccines Other (See Comments)    Reaction:  Caused pt to pass out    Influenza Virus Vaccine Other (See Comments)    Reaction: Passed out for 12 days   Methadone Hives and Itching   Oxycodone-Acetaminophen Nausea And Vomiting   Percocet [Oxycodone-Acetaminophen] Nausea And Vomiting   Sulfa Antibiotics Other (See Comments)    Reaction: unknown   Tetanus Antitoxin Swelling and Other (See Comments)    Reaction: injection site swelling   Tetanus Toxoids Swelling and Other (See Comments)    Reaction:  Swelling at injection site   Penicillin G Rash    Has patient had a PCN reaction causing immediate rash, facial/tongue/throat swelling, SOB or lightheadedness with hypotension: Yes Has patient had a PCN reaction causing severe rash involving mucus membranes or skin necrosis: No Has patient had a PCN reaction that required hospitalization: No Has patient had a PCN reaction occurring within the last 10 years: No If all of the above answers are "NO", then may proceed with Cephalosporin use..   Tetracyclines & Related Rash     Home Medications  Prior to Admission medications  Medication Sig Start Date End Date Taking? Authorizing Provider  ACCU-CHEK GUIDE test strip TEST 3 TIMES DAILY AS DIRECTED 11/30/22   [provider]  acetaminophen  (TYLENOL) 500 MG tablet Take 1,000 mg by mouth every 8 (eight) hours as needed for mild pain or headache.     [provider]  ALPRAZolam Prudy Feeler) 0.5 MG tablet Take 0.5 mg by mouth 4 (four) times daily as needed for anxiety. 10/16/22   [provider]  AMBULATORY NON FORMULARY MEDICATION Compounded estrogen  Apply 1 gram vaginally M,W, Friday [facility] 01/28/21   Vanna Scotland, MD  aspirin 81 MG tablet Take 81 mg by mouth daily.    [provider]  atorvastatin (LIPITOR) 20 MG tablet Take 20 mg by mouth daily.     [provider]  Blood Glucose Monitoring Suppl (ONE TOUCH ULTRA 2) w/Device KIT SMARTSIG:1 Via Meter Daily 01/23/23   [provider]  celecoxib (CELEBREX) 200 MG capsule Take 200 mg by mouth daily.    [provider]  clopidogrel (PLAVIX) 75 MG tablet Take 1 tablet (75 mg total) by mouth daily. Patient not taking: Reported on 05/05/2023 04/05/23   Annice Needy, MD  cyclobenzaprine (FLEXERIL) 10 MG tablet Take 1 tablet by mouth 3 (three) times daily. 04/22/23   [provider]  insulin aspart (NOVOLOG) 100 UNIT/ML injection Inject 3-15 Units into the skin 3 (three) times daily with meals as needed for high blood sugar. Pt uses as needed per sliding scale:    Less than 140:  0 units  140-180:  3 units 181-220:  4 units 221- 260:  6 units 261- 320:  8 units 321-360:  10 units 361-400:  12 units Greater than 400:  15 units    [provider]  isosorbide mononitrate (IMDUR) 30 MG 24 hr tablet Take 30 mg by mouth daily. 11/11/20   [provider]  Lancets (ONETOUCH DELICA PLUS [place]) MISC Apply topically. 01/28/23   [provider]  lisinopril (ZESTRIL) 20 MG tablet Take 20 mg by mouth daily. 11/11/20   [provider]  metoprolol tartrate (LOPRESSOR) 25 MG tablet Take 1 tablet (25 mg total) by mouth 2 (two) times daily. 11/17/20 04/06/23  Jerald Kief, MD  mirtazapine (REMERON)  15 MG tablet Take 15 mg by mouth at bedtime.    [provider]  morphine (MSIR) 15 MG tablet Take 15 mg by mouth 2 (two) times daily. 11/16/22   [provider]  Multiple Vitamins-Minerals (HAIR SKIN AND NAILS FORMULA PO) Take by mouth daily. 2 gummies daily    [provider]  omeprazole (PRILOSEC) 40 MG capsule Take 1 capsule (40 mg total) by mouth daily. 06/24/22   Darlin Priestly, MD  polyvinyl alcohol (LIQUIFILM TEARS) 1.4 % ophthalmic solution Place 1 drop into the right eye 4 (four) times daily. 11/18/22   Arnetha Courser, MD  TOUJEO SOLOSTAR 300 UNIT/ML SOPN Inject 60 Units into the skin every morning. 08/08/19   [provider]  traZODone (DESYREL) 100 MG tablet Take 0.5 tablets (50 mg total) by mouth at bedtime as needed for sleep. 04/07/23   Sunnie Nielsen, DO  venlafaxine XR (EFFEXOR-XR) 75 MG 24 hr capsule Take 75 mg by mouth 2 (two) times daily.    [provider]  Vitamin D, Ergocalciferol, (DRISDOL) 50000 units CAPS capsule Take 50,000 Units by mouth every Sunday.    [provider]     Critical care time: 50 minutes  Raechel Chute, MD [facility] 07/18/2023 10:18 AM

## 2023-07-22 NOTE — TOC Initial Note (Signed)
Transition of Care [facility]) - Initial/Assessment Note    Patient Details  Name: Jessica Donovan MRN: [id number] Date of Birth: 1946-06-06  Transition [facility]) CM/SW Contact:    Kreg Shropshire, RN Phone Number: 07/09/2023, 10:34 AM  Clinical Narrative:                 Cm spoke with friend who is POA Alycia Rossetti. Pt disoriented, Thurston Hole stated that pt has no family left expect daughter. Daughter and pt has been estranged for years and unable to contact daughter for years.   Pt arrived from ED from: Home Caregiver Support: Georganna Skeans visits often. Pt has caregiver company that comes 3 hours a day a few days a week. [employer] 15 hours a week. DME at Home: Dan Humphreys and Development worker, community: friends Previous Services: Software engineer services HH/SNF Preference: None First Person of Contact: Alycia Rossetti   Cm will continue to follow for toc needs and d/c planning.     Barriers to Discharge: Continued Medical Work up   Patient Goals and CMS Choice   CMS Medicare.gov Compare Post Acute Care list provided to:: Patient Represenative (must comment) Choice offered to / list presented to : [facility] POA / Guardian      Expected Discharge Plan and Services     Post Acute Care Choice: Home Health, Skilled Nursing Facility Living arrangements for the past 2 months: Single Family Home                                      Prior Living Arrangements/Services Living arrangements for the past 2 months: Single Family Home Lives with:: Self          Need for Family Participation in Patient Care: Yes (Comment) Care giver support system in place?: Yes (comment) Current home services: DME    Activities of Daily Living      Permission Sought/Granted                  Emotional Assessment              Admission diagnosis:  Ischemic colitis (HCC) [K55.9] Mesenteric ischemia (HCC) [K55.9] Patient Active Problem List   Diagnosis Date Noted   Mesenteric ischemia  (HCC) 07/20/2023   Shock (HCC) 07/20/2023   Acute mesenteric ischemia (HCC) 06/24/2023   Depression with anxiety 06/27/2023   Leukocytosis 07/14/2023   Melena 07/02/2023   Acute hypoxemic respiratory failure (HCC) 04/06/2023   Leg pain, diffuse, right 04/06/2023   PAD (peripheral artery disease) (HCC) 04/06/2023   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   Bell's palsy 11/18/2022   At risk for polypharmacy 06/11/2022   Unresponsiveness 03/27/2020   Acute metabolic encephalopathy 03/27/2020   Unresponsive 03/27/2020   Acute hypercapnic respiratory failure (HCC) 03/27/2020   Change in mental status 02/08/2020   Pressure injury of skin 10/23/2019   Fall    Severe hypertension 10/20/2019   Diabetic ulcer of left heel associated with type 2 diabetes mellitus, limited to breakdown of skin (HCC) 09/11/2019   Right foot infection 12/19/2018   Acute encephalopathy 07/06/2018   Anxiety 06/10/2017   Chest pain with moderate risk for cardiac etiology 06/09/2017   UTI (urinary tract infection) 05/19/2017   CAD S/P percutaneous coronary angioplasty 05/19/2017   Chronic narcotic use 05/19/2017   Cellulitis in diabetic foot (HCC) 05/19/2017   Opioid overdose (HCC) 02/23/2017   Altered mental status  02/23/2017   Acute colitis 01/19/2017   Sepsis (HCC) 01/17/2017   Cellulitis of fourth toe of right foot 04/10/2016   Somnolence 04/09/2016   Cellulitis 04/03/2016   Hematoma of right parietal scalp 02/23/2016   Multiple falls 02/23/2016   Physical debility 02/23/2016   Type 2 diabetes mellitus (HCC) 02/23/2016   Syncope and collapse 02/23/2016   Polypharmacy 02/23/2016   Osteomyelitis of toe (HCC) 01/23/2016   Rhabdomyolysis 12/07/2015   ARF (acute renal failure) (HCC) 11/29/2015   Hypotension 11/29/2015   Chronic ischemic heart disease 11/27/2015   Diabetic osteomyelitis (HCC) 11/07/2015   Angina effort 11/07/2015   Osteomyelitis (HCC) 11/07/2015   Anemia due to chronic blood loss  06/14/2015   Generalized anxiety disorder 06/14/2015   Lumbago with sciatica 06/14/2015   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   S/P cardiac catheterization 06/04/2015   Old myocardial infarction 12/07/2014   Degeneration of intervertebral disc at C4-C5 level 07/19/2014   Abnormal weight loss 05/18/2014   Chronic pain syndrome 05/18/2014   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 [place] [address] Phone: [phone] Fax: [phone]  [facility] - [place], [place] - [address] [place] [address] Phone: [phone] Fax: [phone]  [facility] - [place], [place] - [address] [place] [address] Phone: [phone] Fax: [phone]     Social Determinants of Health (SDOH) Social History: SDOH Screenings   Food Insecurity: No Food Insecurity (04/27/2023)   Received from [facility], [facility]  Housing: Low Risk  (04/07/2023)  Transportation Needs: No Transportation Needs (04/27/2023)   Received from [facility], [facility]  Utilities: Not At Risk (04/27/2023)   Received from [facility], [facility]  Financial Resource Strain: Low Risk  (04/27/2023)   Received from [facility], [facility]  Tobacco Use: Low Risk  (05/05/2023)   SDOH Interventions:     Readmission Risk Interventions    06/13/2022   10:24 AM  Readmission Risk Prevention Plan  Post Dischage Appt Complete   Medication Screening Complete  Transportation Screening Complete

## 2023-07-22 NOTE — Progress Notes (Signed)
Power of attorney paper work supplied by [employer].  Gold-colored necklace, gold-colored pair of earrings and white purse with brown handle given to [employer].

## 2023-07-22 NOTE — Death Summary Note (Signed)
DEATH SUMMARY   Patient Details  Name: Jessica Donovan MRN: [id number] DOB: [birth year]  Admission/Discharge Information   Admit Date:  08/13/23  Date of Death: Date of Death: 2023/08/15  Time of Death: Time of Death: 1947  Length of Stay: 2  Referring Physician: Mick Sell, MD   Reason(s) for Hospitalization  Abdominal pain s/t acute on chronic mesenteric ischemia.  Diagnoses  Preliminary cause of death: Small bowel ischemia (HCC) Secondary Diagnoses (including complications and co-morbidities):  Principal Problem:   Acute mesenteric ischemia (HCC) Active Problems:   AKI (acute kidney injury) (HCC)   Type 2 diabetes mellitus (HCC)   Sepsis (HCC)   CAD S/P percutaneous coronary angioplasty   Chronic pain syndrome   Acute encephalopathy   Severe hypertension   Depression with anxiety   Leukocytosis   Melena   Mesenteric ischemia (HCC)   Shock (HCC)   Small bowel ischemia (HCC)   Pneumatosis of intestines   Metabolic acidosis with normal anion gap and bicarbonate losses   Ileus (HCC)   Ventricular tachycardia (HCC)   Atrial fibrillation with RVR (HCC)   Transaminitis   Brief Hospital Course (including significant findings, care, treatment, and services provided and events leading to death)  Jessica Donovan is a 77 y.o. 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  She presented to [facility] ER on 08-13-2023 via EMS from home with a 2 week hx of worsening abdominal pain.  Per ER notes she also endorsed nausea, vomiting, chills, and diarrhea.  She also had 1 episode of bloody diarrhea.   ED Course Upon arrival to the ER significant lab results were: glucose 282/wbc 24.7/platelets 426/lactic acid 3.6.  She received iv fluids, morphine, and zofran.  CTA Abd Pelvis revealed SMA occlusion of prior stent  placement.  Vascular Surgery consulted by EDP, following evaluation proceeded with mechanical thrombectomy of superior mesenteric artery that was previously stented.  Pt admitted to the progressive care unit per hospitalist for additional workup and treatment. 2023-08-13: Pt admitted to the progressive care unit SMA occlusion of prior stent placement s/p mechanical thrombectomy of superior mesenteric artery that was previously stented per vascular surgery  07/30-7/31: Rapid response initiated pt pale; tachypneic; and hypotensive bp 83/48, tachycardic hr 100-104 but alert, CBG in the 300's.  She received 1L IV fluid bolus and transferred to the stepdown unit.  Despite iv fluid resuscitation she remained hypotensive requiring levophed gtt PCCM team consulted. Overnight the patient continued to decompensate developing a sustained Ventricular tachycardia, worsening NAGMA and AKI and then A-fib RVR requiring amiodarone bolus and drip. Repeat CT angio concerning for small bowel ischemia and pneumatosis as well as a reactive ileus. The patient's mentation was also waxing and waning with some intermittent delirium and had no family available. Due to grave prognosis and concern that the patient would not survive surgery every effort was made to get in touch with her POA. Who confirmed DNR/DNI and approved transition to comfort measures.  Patient passed away on 08-15-2023.   Pertinent Labs and Studies  Significant Diagnostic Studies ECHOCARDIOGRAM COMPLETE  Result Date: 08-15-2023    ECHOCARDIOGRAM REPORT   Patient Name:   Jessica Donovan Date of Exam: 08-15-23 Medical Rec #:  [id number]               Height:       67.0  in Accession #:    [id number]              Weight:       188.7 lb Date of Birth:  [birth year]               BSA:          1.973 m Patient Age:    77 years                BP:           101/57 mmHg Patient Gender: F                       HR:           133 bpm. Exam Location:  [facility] Procedure: 2D Echo,  Cardiac Doppler and Color Doppler Indications:     Ventricular Tachycardia I47.2  History:         Patient has prior history of Echocardiogram examinations, most                  recent 03/27/2020. Previous Myocardial Infarction, Stroke; Risk                  Factors:Hypertension and Diabetes.  Sonographer:     Jessica Donovan Referring Phys:  [id number] Jessica Donovan Diagnosing Phys: Jessica Nordmann MD  Sonographer Comments: Technically challenging study due to limited acoustic windows, no apical window and no subcostal window. IMPRESSIONS  1. Left ventricular ejection fraction, by estimation, is 60 to 65%. The left ventricle has normal function. The left ventricle has no regional wall motion abnormalities. There is moderate left ventricular hypertrophy. Left ventricular diastolic parameters are indeterminate.  2. Right ventricular systolic function is normal. The right ventricular size is normal. Tricuspid regurgitation signal is inadequate for assessing PA pressure.  3. The mitral valve is normal in structure. No evidence of mitral valve regurgitation. No evidence of mitral stenosis.  4. The aortic valve is normal in structure. Aortic valve regurgitation is not visualized. Aortic valve sclerosis is present, with no evidence of aortic valve stenosis.  5. The inferior vena cava is normal in size with greater than 50% respiratory variability, suggesting right atrial pressure of 3 mmHg. FINDINGS  Left Ventricle: Left ventricular ejection fraction, by estimation, is 60 to 65%. The left ventricle has normal function. The left ventricle has no regional wall motion abnormalities. The left ventricular internal cavity size was normal in size. There is  moderate left ventricular hypertrophy. Left ventricular diastolic parameters are indeterminate. Right Ventricle: The right ventricular size is normal. No increase in right ventricular wall thickness. Right ventricular systolic function is normal. Tricuspid regurgitation signal  is inadequate for assessing PA pressure. Left Atrium: Left atrial size was normal in size. Right Atrium: Right atrial size was normal in size. Pericardium: There is no evidence of pericardial effusion. Mitral Valve: The mitral valve is normal in structure. No evidence of mitral valve regurgitation. No evidence of mitral valve stenosis. Tricuspid Valve: The tricuspid valve is normal in structure. Tricuspid valve regurgitation is mild . No evidence of tricuspid stenosis. Aortic Valve: The aortic valve is normal in structure. Aortic valve regurgitation is not visualized. Aortic valve sclerosis is present, with no evidence of aortic valve stenosis. Pulmonic Valve: The pulmonic valve was normal in structure. Pulmonic valve regurgitation is not visualized. No evidence of pulmonic stenosis. Aorta: The aortic root is normal in size and structure. Venous: The inferior vena cava is  normal in size with greater than 50% respiratory variability, suggesting right atrial pressure of 3 mmHg. IAS/Shunts: No atrial level shunt detected by color flow Doppler.  LEFT VENTRICLE PLAX 2D LVIDd:         4.20 cm LVIDs:         2.10 cm LV PW:         1.30 cm LV IVS:        1.40 cm LVOT diam:     2.10 cm LVOT Area:     3.46 cm  LEFT ATRIUM         Index LA diam:    4.10 cm 2.08 cm/m   AORTA Ao Root diam: 3.10 cm  SHUNTS Systemic Diam: 2.10 cm Jessica Nordmann MD Electronically signed by Jessica Nordmann MD Signature Date/Time: 06/26/2023/9:32:04 AM    Final    DG Abd 1 View  Result Date: 07/04/2023 CLINICAL DATA:  Nasogastric tube placement EXAM: ABDOMEN - 1 VIEW COMPARISON:  CTA from earlier today FINDINGS: Enteric tube with tip and side port at the stomach. No change in the covered bowel gas pattern. A lucent interface over the right upper quadrant is attributed to skin fold. IMPRESSION: Enteric tube with tip and side port at the stomach. Electronically Signed   By: Tiburcio Pea M.D.   On: 07/20/2023 06:41   CT Angio Abd/Pel w/ and/or  w/o  Result Date: 06/29/2023 CLINICAL DATA:  Nausea and vomiting and diarrhea. Concern for mesenteric ischemia. Status post mesenteric artery stenting and thrombectomy of the SMA stent. EXAM: CTA ABDOMEN AND PELVIS WITHOUT AND WITH CONTRAST TECHNIQUE: Multidetector CT imaging of the abdomen and pelvis was performed using the standard protocol during bolus administration of intravenous contrast. Multiplanar reconstructed images and MIPs were obtained and reviewed to evaluate the vascular anatomy. RADIATION DOSE REDUCTION: This exam was performed according to the departmental dose-optimization program which includes automated exposure control, adjustment of the mA and/or kV according to patient size and/or use of iterative reconstruction technique. CONTRAST:  75mL OMNIPAQUE IOHEXOL 350 MG/ML SOLN COMPARISON:  CT of the abdomen pelvis dated 06/28/2023. FINDINGS: VASCULAR Aorta: Advanced calcified and noncalcified plaque of the abdominal aorta. No aneurysmal dilatation or dissection. No periaortic fluid collection. Celiac: There is atherosclerotic calcification of the origin of the celiac artery with approximately 50% luminal narrowing. The celiac artery and its major branches appear small and somewhat diminutive but remain patent. SMA: There is a stent in the proximal SMA. The stent appears patent on today's study. This was occluded on the prior CT of 07/11/2023. Renals: There is atherosclerotic calcification of the origins of the renal arteries, left greater right. The renal arteries appear somewhat narrow and diminutive but remain patent. Small accessory right renal artery supplying the inferior pole of the right kidney. IMA: The IMA is patent. Inflow: Mild atherosclerotic calcification of the iliac arteries. The iliac arteries remain patent. No aneurysmal dilatation or dissection. Proximal Outflow: The visualized proximal outflow is patent Veins: No obvious venous abnormality within the limitations of this  arterial phase study. No portal venous gas. Review of the MIP images confirms the above findings. NON-VASCULAR Lower chest: Bibasilar subpleural atelectasis/scarring. There is coronary vascular calcification. No intra-abdominal free air. Small free fluid in the lower abdomen and pelvis. Hepatobiliary: The liver is unremarkable. No biliary ductal dilatation. Cholecystectomy. No retained calcified stone noted in the central CBD. Pancreas: No active inflammatory changes. Spleen: Normal in size without focal abnormality. Adrenals/Urinary Tract: The adrenal glands are unremarkable. There is no  hydronephrosis on either side. The visualized ureters and urinary bladder appear unremarkable. Stomach/Bowel: There is inflammatory changes of several loops of small bowel in the mid to lower abdomen. There is associated wall thickening and pneumatosis of these bowel loops suggestive of bowel ischemia. Mild dilatation of these bowel loops measure up to 3.7 cm consistent with reactive ileus. No definite evidence of mechanical obstruction. The appendix is not visualized with certainty. No inflammatory changes identified in the right lower quadrant. Lymphatic: No adenopathy. Reproductive: Hysterectomy.  No adnexal masses. Other: Compression device over the right groin. Musculoskeletal: Osteopenia with degenerative changes of the spine and hips. No acute osseous pathology. IMPRESSION: 1. Interval patency of the SMA stent in keeping with thrombectomy. 2. Inflammatory changes of several loops of small bowel in the mid to lower abdomen with associated wall thickening and pneumatosis suggestive of bowel ischemia. Findings may be embolic in etiology. 3.  Aortic Atherosclerosis (ICD10-I70.0). These results were called by telephone at the time of interpretation on 06/24/2023 at 1:55 am to nurse practitioner Nycholas Rayner Donovan , who verbally acknowledged these results. Electronically Signed   By: Elgie Collard M.D.   On: 07/06/2023 02:12    DG Abd 1 View  Result Date: 07/20/2023 CLINICAL DATA:  Lethargic suspected GI bleed EXAM: ABDOMEN - 1 VIEW COMPARISON:  CT 06/30/2023 FINDINGS: Moderate air distension of the stomach. Moderate air distension of the bowel. Contrast in the urinary bladder. Possible intramural air within the left pelvis. IMPRESSION: Moderate air distension of the stomach and bowel, possible ileus. Appearance suggesting possible intramural air within the left pelvic bowel loop, CT suggested for further assessment. These results will be called to the ordering clinician or representative by the Radiologist Assistant, and communication documented in the PACS or [employer]. Electronically Signed   By: Jasmine Pang M.D.   On: 07/20/2023 19:17   PERIPHERAL VASCULAR CATHETERIZATION  Result Date: 06/25/2023 See surgical note for result.  CT Angio Abd/Pel W and/or Wo Contrast  Result Date: 06/25/2023 CLINICAL DATA:  Acute mesenteric ischemia. EXAM: CTA ABDOMEN AND PELVIS WITHOUT AND WITH CONTRAST TECHNIQUE: Multidetector CT imaging of the abdomen and pelvis was performed using the standard protocol during bolus administration of intravenous contrast. Multiplanar reconstructed images and MIPs were obtained and reviewed to evaluate the vascular anatomy. RADIATION DOSE REDUCTION: This exam was performed according to the departmental dose-optimization program which includes automated exposure control, adjustment of the mA and/or kV according to patient size and/or use of iterative reconstruction technique. CONTRAST:  OMNIPAQUE IOHEXOL 350 MG/ML SOLN COMPARISON:  Chest CT-04/06/2023; abdominal CT-01/12/2023 FINDINGS: VASCULAR Aorta: There is a moderate amount of mixed calcified and noncalcified atherosclerotic plaque throughout the normal caliber abdominal aorta, not resulting in a hemodynamically significant stenosis. No evidence of abdominal aortic dissection or perivascular stranding. Celiac: There is a moderate amount  of mixed calcified and noncalcified atherosclerotic plaque involving the origin of the celiac artery, approaching 50% luminal narrowing. Conventional branching pattern. SMA: The patient has undergone interval stenting of the origin and proximal aspect of the SMA. The stent appears occluded with early reconstitution of the SMA via hypertrophied collateral supply from the celiac and likely retrograde supply from the IMA. 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 There is a minimal amount of eccentric calcified plaque involving the origin of the bilateral renal arteries, left-greater-than-right, not definitely resulting in hemodynamically significant stenosis. No vessel irregularity to suggest FMD. IMA:  Remains patent, likely with retrograde collateral supply to the SMA distribution. Inflow: There is a minimal amount of eccentric predominantly calcified atherosclerotic plaque involving the bilateral common iliac arteries, not resulting in hemodynamically significant stenosis. The bilateral external iliac arteries are tortuous but of normal caliber and without hemodynamically significant narrowing. The bilateral internal iliac arteries are diseased though patent and of normal caliber. Proximal Outflow: There is a very minimal amount calcified and noncalcified atherosclerotic plaque involving the bilateral common femoral arteries, right-greater-than-left, not definitely resulting in hemodynamically significant stenosis. The imaged portions of the bilateral deep and superficial femoral arteries are of normal caliber and widely patent throughout their imaged courses. Veins: The IVC and pelvic venous systems appear widely patent. Review of the MIP images confirms the above findings. _________________________________________________________ NON-VASCULAR Lower chest: Limited visualization of the lower thorax demonstrates minimal  dependent subpleural ground-Lepera atelectasis. There is a minimal amount of subsegmental atelectasis involving the caudal aspect of the lingula. No discrete focal airspace opacities. Normal heart size. Coronary artery calcifications. Calcifications involving the mitral valve annulus. No pericardial effusion. Hepatobiliary: Mild nodularity of the of the hepatic contour with partial recanalization of the periumbilical vein. No discrete hepatic lesions. Postcholecystectomy. There is mild dilatation of the common bile duct measuring 0.8 cm in diameter (image 71, series 13), with associated mild intrahepatic biliary ductal dilatation, likely the sequela of biliary reservoir phenomena in the setting of postcholecystectomy state. No ascites. Pancreas: The pancreas is largely fatty replaced, unchanged. Spleen: Normal appearance of the spleen. Adrenals/Urinary Tract: There is symmetric enhancement of the bilateral kidneys. No evidence of nephrolithiasis on this postcontrast examination. No discrete renal lesions. There is a minimal amount of likely age and body habitus related perinephric stranding. No urinary obstruction. Normal appearance of the bilateral adrenal glands. Normal appearance of the urinary bladder given degree of distention. Stomach/Bowel: There is a moderate amount of liquid stool seen within the cecum and ascending colon. There is circumferential wall thickening involving the descending and sigmoid colon, not resulting in enteric obstruction. The appendix is not visualized, however there is no pericecal inflammatory change. No significant hiatal hernia. No pneumoperitoneum, pneumatosis or portal venous gas. Lymphatic: No bulky retroperitoneal, mesenteric, pelvic or inguinal lymphadenopathy. Reproductive: Post hysterectomy.  No discrete adnexal lesions. Other: Well-healed midline lower abdominal/pelvic incision without hernia. Tiny mesenteric fat containing periumbilical hernia. Musculoskeletal: No acute or  aggressive osseous abnormalities. Severe multilevel lumbar spine DDD, worse at L3-L4, L4-L5 and L5-S1 with disc space height loss, endplate irregularity and sclerosis. Severe bilateral facet degenerative change is seen within the lower lumbar spine. Moderate to severe degenerative change of the bilateral hips with joint space loss, subchondral sclerosis and osteophytosis, right-greater-than-left. IMPRESSION: Vascular Impression: 1. Interval stenting of the origin and proximal aspect of the SMA with occlusion of the stent and early reconstitution of the SMA via hypertrophied collateral supply from the celiac and IMA. 2. Moderate amount of mixed calcified and noncalcified atherosclerotic plaque involving the origin of the celiac artery approaches 50% luminal narrowing. 3. Aortic Atherosclerosis (ICD10-I70.0). Nonvascular Impression: 1. Circumferential wall thickening involving the descending and sigmoid colon without evidence of pneumatosis or portal venous gas, nonspecific though given SMA stent occlusion, early ischemic changes are not excluded on the basis of this examination. Correlation with lactate levels could be performed as indicated. 2. Mild nodularity of the hepatic contour with partial recanalization of the periumbilical vein, nonspecific though could be seen in the setting of early cirrhosis. Correlation with LFTs is advised. Electronically Signed  By: Simonne Come M.D.   On: 07/11/2023 13:06    Microbiology Recent Results (from the past 240 hour(s))  SARS Coronavirus 2 by RT PCR (hospital order, performed in [facility] hospital lab) *cepheid single result test* Anterior Nasal Swab     Status: None   Collection Time: 06/24/2023 11:06 AM   Specimen: Anterior Nasal Swab  Result Value Ref Range Status   SARS Coronavirus 2 by RT PCR NEGATIVE NEGATIVE Final    Comment: (NOTE) SARS-CoV-2 target nucleic acids are NOT DETECTED.  The SARS-CoV-2 RNA is generally detectable in upper and lower respiratory  specimens during the acute phase of infection. The lowest concentration of SARS-CoV-2 viral copies this assay can detect is 250 copies / mL. A negative result does not preclude SARS-CoV-2 infection and should not be used as the sole basis for treatment or other patient management decisions.  A negative result may occur with improper specimen collection / handling, submission of specimen other than nasopharyngeal swab, presence of viral mutation(s) within the areas targeted by this assay, and inadequate number of viral copies (<250 copies / mL). A negative result must be combined with clinical observations, patient history, and epidemiological information.  Fact Sheet for Patients:   RoadLapTop.co.za  Fact Sheet for Healthcare Providers: http://kim-miller.com/  This test is not yet approved or  cleared by the [place] FDA and has been authorized for detection and/or diagnosis of SARS-CoV-2 by FDA under an Emergency Use Authorization (EUA).  This EUA will remain in effect (meaning this test can be used) for the duration of the COVID-19 declaration under Section 564(b)(1) of the Act, 21 U.S.C. section 360bbb-3(b)(1), unless the authorization is terminated or revoked sooner.  Performed at [facility], [address] [place], [place] [address]   MRSA Next Gen by PCR, Nasal     Status: None   Collection Time: 07/20/23  5:44 PM   Specimen: Nasal Mucosa; Nasal Swab  Result Value Ref Range Status   MRSA by PCR Next Gen NOT DETECTED NOT DETECTED Final    Comment: (NOTE) The GeneXpert MRSA Assay (FDA approved for NASAL specimens only), is one component of a comprehensive MRSA colonization surveillance program. It is not intended to diagnose MRSA infection nor to guide or monitor treatment for MRSA infections. Test performance is not FDA approved in patients less than 66 years old. Performed at [facility], [address] [place], [place] [address]   Culture, blood (Routine X 2) w Reflex to ID Panel     Status: None (Preliminary result)   Collection Time: 07/20/23  7:08 PM   Specimen: BLOOD  Result Value Ref Range Status   Specimen Description BLOOD RIGHT ANTECUBITAL  Final   Special Requests   Final    BOTTLES DRAWN AEROBIC AND ANAEROBIC Blood Culture adequate volume   Culture   Final    NO GROWTH < 12 HOURS Performed at [facility], [address], [place], [place] [address]    Report Status PENDING  Incomplete  Culture, blood (Routine X 2) w Reflex to ID Panel     Status: None (Preliminary result)   Collection Time: 07/20/23  7:09 PM   Specimen: BLOOD  Result Value Ref Range Status   Specimen Description BLOOD BLOOD LEFT HAND  Final   Special Requests   Final    BOTTLES DRAWN AEROBIC AND ANAEROBIC Blood Culture adequate volume   Culture   Final    NO GROWTH < 12 HOURS Performed  at [facility], [address] [place], [place] [address]    Report Status PENDING  Incomplete    Lab Basic Metabolic Panel: Recent Labs  Lab 07/10/2023 1009 07/20/23 0411 07/20/23 1908 07/20/23 2148 06/27/2023 0446  NA 135 138 133*  --  131*  K 3.6 3.5 3.4* 3.2* 3.6  CL 99 105 104  --  98  CO2 21* 20* 17*  --  21*  GLUCOSE 282* 322* 233*  --  200*  BUN 21 33* 51*  --  54*  CREATININE 0.76 0.88 1.74*  --  1.94*  CALCIUM 10.1 9.3 8.6*  --  8.2*  MG  --   --   --  1.5* 2.5*  PHOS  --   --   --  2.0* 3.4   Liver Function Tests: Recent Labs  Lab 07/08/2023 1009 07/20/23 1908  AST 37 85*  ALT 12 32  ALKPHOS 81 41  BILITOT 0.6 0.4  PROT 7.6 5.3*  ALBUMIN 4.5 2.7*   Recent Labs  Lab 06/28/2023 1009  LIPASE 38   No results for input(s): "AMMONIA" in the last 168 hours. CBC: Recent Labs  Lab 06/26/2023 1009 07/20/23 0411 07/20/23 1908 07/19/2023 0216  WBC 24.7* 13.5* 14.5* 14.4*  NEUTROABS  --  10.1* 10.1*  --   HGB 16.3* 15.3* 13.0 12.7  HCT 48.9* 46.6* 39.3 38.3  MCV 95.7  96.5 96.8 96.2  PLT 426* 337 321 289   Cardiac Enzymes: No results for input(s): "CKTOTAL", "CKMB", "CKMBINDEX", "TROPONINI" in the last 168 hours. Sepsis Labs: Recent Labs  Lab 07/18/2023 1009 07/18/2023 1106 07/20/23 0411 07/20/23 1908 07/20/23 2148 06/26/2023 0216 06/28/2023 0446  PROCALCITON  --   --   --  15.65  --   --   --   WBC 24.7*  --  13.5* 14.5*  --  14.4*  --   LATICACIDVEN  --    < >  --  3.6* 5.1* 4.1* 4.3*   < > = values in this interval not displayed.    Procedures/Operations  06/24/2023: mechanical thrombectomy of the SMA stent   Micheline Rough L Donovan 07/20/2023, 8:01 PM  Cheryll Cockayne Donovan, AGACNP-BC Acute Care Nurse Practitioner [facility]   [phone] / [phone] Please see Amion for details.

## 2023-07-22 NOTE — Consult Note (Addendum)
[facility] SURGICAL CONSULTATION NOTE (initial) - cpt: 99255   HISTORY OF PRESENT ILLNESS (HPI):  76 y.o. female presented to [facility] for evaluation of abdominal pain, she has a history of an SMA stent. Patient reportedly was on antiplatelet therapy, and underwent thrombectomy of her SMA.  She was kept on heparin from that time.  She confirmed yesterday that she was still DNR/DNI and that was consistent with her wishes.  However later in the afternoon she became hypotensive, and was transferred to the ICU to begin pressor drips.  She had an episode of bloody diarrhea, KUB was obtained with a suggestion of possible intramural air, CT was obtained due to patient becoming more tachycardic and acidotic.  CT suggested a loop of small bowel consistent with ischemia.  She was placed on amiodarone for atrial fibrillation with rapid ventricular response, she became somewhat agitated and would not comply with a central venous line placement that was recommended.  Surgery is consulted by ICU provider in this context for evaluation and management of likely ischemic bowel despite full heparinization.  Per my discussion with the patient she is intermittently lucid, with intervals of perseverance and inability to focus.  She is unwilling to make a decision regarding proceeding with an emergent operation, and is well aware she may die without it, and is at high risk of not surviving her operation.  It appears her greatest wishes to make contact with an estranged daughter as we obtain more information to contact her.  PAST MEDICAL HISTORY (PMH):  Past Medical History:  Diagnosis Date   Anxiety    Chronic back pain    Collagen vascular disease (HCC)    Coronary artery disease    a. s/p PCI/DES to dRCA & mLCx in 2014 with repeat LHC in 10/2015 showing patent stents   Diabetes mellitus without complication (HCC)    Hypertension    Low back pain    Myocardial infarction (HCC)    Osteomyelitis of toe  (HCC) 01/23/2016   Stroke (HCC)      PAST SURGICAL HISTORY (PSH):  Past Surgical History:  Procedure Laterality Date   AMPUTATION TOE Left 11/10/2015   Procedure: AMPUTATION TOE;  Surgeon: Linus Galas, MD;  Location: [facility] ORS;  Service: Podiatry;  Laterality: Left;   AMPUTATION TOE Left 01/24/2016   Procedure: AMPUTATION TOE (2nd mpj);  Surgeon: Linus Galas, DPM;  Location: [facility] OR[facility];  Service: Podiatry;  Laterality: Left;   AMPUTATION TOE Right 12/21/2018   Procedure: 2ND TOE AMPUTATION WITH DEBRIDEMENT OF SOFT TISSUE;  Surgeon: Recardo Evangelist, DPM;  Location: [facility] OR[facility];  Service: Podiatry;  Laterality: Right;   AMPUTATION TOE Left 06/12/2022   Procedure: AMPUTATION TOE; PARTIAL AMPUTATION LEFT GREAT TOE;  Surgeon: Linus Galas, DPM;  Location: [facility] OR[facility];  Service: Podiatry;  Laterality: Left;   CARDIAC CATHETERIZATION N/A 11/12/2015   Procedure: Left Heart Cath and Coronary Angiography;  Surgeon: Marcina Millard, MD;  Location: [facility] INVASIVE CV LAB;  Service: Cardiovascular;  Laterality: N/A;   COLONOSCOPY WITH PROPOFOL N/A 07/20/2017   Procedure: COLONOSCOPY WITH PROPOFOL;  Surgeon: Wyline Mood, MD;  Location: [facility] ENDOSCOPY;  Service: Endoscopy;  Laterality: N/A;   JOINT REPLACEMENT     TOE AMPUTATION     VISCERAL ANGIOGRAPHY N/A 04/05/2023   Procedure: VISCERAL ANGIOGRAPHY;  Surgeon: Annice Needy, MD;  Location: [facility] INVASIVE CV LAB;  Service: Cardiovascular;  Laterality: N/A;   VISCERAL ANGIOGRAPHY N/A 06/28/2023   Procedure: VISCERAL ANGIOGRAPHY;  Surgeon: Annice Needy, MD;  Location: [facility] INVASIVE CV LAB;  Service: Cardiovascular;  Laterality: N/A;     MEDICATIONS:  Prior to Admission medications   Medication Sig Start Date End Date Taking? Authorizing Provider  ACCU-CHEK GUIDE test strip TEST 3 TIMES DAILY AS DIRECTED 11/30/22   [provider]  acetaminophen (TYLENOL) 500 MG tablet Take 1,000 mg by mouth every 8 (eight) hours as needed for mild pain or headache.     [provider]  ALPRAZolam Prudy Feeler) 0.5 MG tablet Take 0.5 mg by mouth 4 (four) times daily as needed for anxiety. 10/16/22   [provider]  AMBULATORY NON FORMULARY MEDICATION Compounded estrogen  Apply 1 gram vaginally M,W, Friday [facility] 01/28/21   Vanna Scotland, MD  aspirin 81 MG tablet Take 81 mg by mouth daily.    [provider]  atorvastatin (LIPITOR) 20 MG tablet Take 20 mg by mouth daily.     [provider]  Blood Glucose Monitoring Suppl (ONE TOUCH ULTRA 2) w/Device KIT SMARTSIG:1 Via Meter Daily 01/23/23   [provider]  celecoxib (CELEBREX) 200 MG capsule Take 200 mg by mouth daily.    [provider]  clopidogrel (PLAVIX) 75 MG tablet Take 1 tablet (75 mg total) by mouth daily. Patient not taking: Reported on 05/05/2023 04/05/23   Annice Needy, MD  cyclobenzaprine (FLEXERIL) 10 MG tablet Take 1 tablet by mouth 3 (three) times daily. 04/22/23   [provider]  insulin aspart (NOVOLOG) 100 UNIT/ML injection Inject 3-15 Units into the skin 3 (three) times daily with meals as needed for high blood sugar. Pt uses as needed per sliding scale:    Less than 140:  0 units  140-180:  3 units 181-220:  4 units 221- 260:  6 units 261- 320:  8 units 321-360:  10 units 361-400:  12 units Greater than 400:  15 units    [provider]  isosorbide mononitrate (IMDUR) 30 MG 24 hr tablet Take 30 mg by mouth daily. 11/11/20   [provider]  Lancets (ONETOUCH DELICA PLUS [place]) MISC Apply topically. 01/28/23   [provider]  lisinopril (ZESTRIL) 20 MG tablet Take 20 mg by mouth daily. 11/11/20   [provider]  metoprolol tartrate (LOPRESSOR) 25 MG tablet Take 1 tablet (25 mg total) by mouth 2 (two) times daily. 11/17/20 04/06/23  Jerald Kief, MD  mirtazapine (REMERON) 15 MG tablet Take 15 mg by mouth at bedtime.    [provider]  morphine (MSIR) 15 MG tablet Take 15 mg by  mouth 2 (two) times daily. 11/16/22   [provider]  Multiple Vitamins-Minerals (HAIR SKIN AND NAILS FORMULA PO) Take by mouth daily. 2 gummies daily    [provider]  omeprazole (PRILOSEC) 40 MG capsule Take 1 capsule (40 mg total) by mouth daily. 06/24/22   Darlin Priestly, MD  polyvinyl alcohol (LIQUIFILM TEARS) 1.4 % ophthalmic solution Place 1 drop into the right eye 4 (four) times daily. 11/18/22   Arnetha Courser, MD  TOUJEO SOLOSTAR 300 UNIT/ML SOPN Inject 60 Units into the skin every morning. 08/08/19   [provider]  traZODone (DESYREL) 100 MG tablet Take 0.5 tablets (50 mg total) by mouth at bedtime as needed for sleep. 04/07/23   Sunnie Nielsen, DO  venlafaxine XR (EFFEXOR-XR) 75 MG 24 hr capsule Take 75 mg by mouth 2 (two) times daily.    [provider]  Vitamin D, Ergocalciferol, (DRISDOL) 50000 units CAPS capsule Take  50,000 Units by mouth every Sunday.    [provider]     ALLERGIES:  Allergies  Allergen Reactions   Amoxicillin Diarrhea   Ampicillin Nausea And Vomiting and Other (See Comments)    Did it involve swelling of the face/tongue/throat, SOB, or low BP? Yes Did it involve sudden or severe rash/hives, skin peeling, or any reaction on the inside of your mouth or nose? No Did you need to seek medical attention at a hospital or doctor's office? No When did it last happen? >10 years If all above answers are "NO", may proceed with cephalosporin use.    Augmentin [Amoxicillin-Pot Clavulanate] Diarrhea   Bactrim [Sulfamethoxazole-Trimethoprim] Other (See Comments)    N/V/D   Codeine Nausea And Vomiting   Egg-Derived Products     ALLergic to cook or raw egg only. Food with egg ingredient is okay for patient.    Influenza Vaccines Other (See Comments)    Reaction:  Caused pt to pass out    Influenza Virus Vaccine Other (See Comments)    Reaction: Passed out for 12 days   Methadone Hives and Itching   Oxycodone-Acetaminophen  Nausea And Vomiting   Percocet [Oxycodone-Acetaminophen] Nausea And Vomiting   Sulfa Antibiotics Other (See Comments)    Reaction: unknown   Tetanus Antitoxin Swelling and Other (See Comments)    Reaction: injection site swelling   Tetanus Toxoids Swelling and Other (See Comments)    Reaction:  Swelling at injection site   Penicillin G Rash    Has patient had a PCN reaction causing immediate rash, facial/tongue/throat swelling, SOB or lightheadedness with hypotension: Yes Has patient had a PCN reaction causing severe rash involving mucus membranes or skin necrosis: No Has patient had a PCN reaction that required hospitalization: No Has patient had a PCN reaction occurring within the last 10 years: No If all of the above answers are "NO", then may proceed with Cephalosporin use..   Tetracyclines & Related Rash     SOCIAL HISTORY:  Social History   Socioeconomic History   Marital status: Widowed    Spouse name: Not on file   Number of children: Not on file   Years of education: Not on file   Highest education level: Not on file  Occupational History   Not on file  Tobacco Use   Smoking status: Never   Smokeless tobacco: Never  Vaping Use   Vaping status: Never Used  Substance and Sexual Activity   Alcohol use: No   Drug use: No   Sexual activity: Not Currently  Other Topics Concern   Not on file  Social History Narrative   Not on file   Social Determinants of Health   Financial Resource Strain: Low Risk  (04/27/2023)   [facility] from [facility], [facility]   Overall Financial Resource Strain (CARDIA)    Difficulty of Paying Living Expenses: Not hard at all  Food Insecurity: No Food Insecurity (04/27/2023)   Received from [facility], [facility]   Hunger Vital Sign    Worried About Running Out of Food in the Last Year: Never true    Ran Out of Food in the Last Year: Never true  Transportation  Needs: No Transportation Needs (04/27/2023)   Received from [facility], [facility]   [facility] - Transportation    In the past 12 months, has lack of transportation kept you from medical appointments or from getting medications?:  No    Lack of Transportation (Non-Medical): No  Physical Activity: Not on file  Stress: Not on file  Social Connections: Not on file  Intimate Partner Violence: Not At Risk (04/07/2023)   Humiliation, Afraid, Rape, and Kick questionnaire    Fear of Current or Ex-Partner: No    Emotionally Abused: No    Physically Abused: No    Sexually Abused: No     FAMILY HISTORY:  Family History  Problem Relation Age of Onset   CAD Mother    CAD Father       REVIEW OF SYSTEMS:  Review of Systems  Unable to perform ROS: Critical illness    VITAL SIGNS:  Temp:  [97.8 F (36.6 C)-98.7 F (37.1 C)] 98.2 F (36.8 C) (07/31 0400) Pulse Rate:  [97-158] 121 (07/31 0500) Resp:  [16-45] 17 (07/31 0500) BP: (67-170)/(42-154) 104/52 (07/31 0500) SpO2:  [71 %-100 %] 96 % (07/31 0500) Weight:  [85.6 kg] 85.6 kg (07/31 0310)     Height: 5\' 7"  (170.2 cm) Weight: 85.6 kg BMI (Calculated): 29.55   INTAKE/OUTPUT:  07/30 0701 - 07/31 0700 In: 3509.6 [I.V.:1792.9; IV Piggyback:1716.7] Out: 150 [Urine:150]  PHYSICAL EXAM:  Physical Exam Blood pressure (!) 104/52, pulse (!) 121, temperature 98.2 F (36.8 C), temperature source Axillary, resp. rate 17, height 5\' 7"  (1.702 m), weight 85.6 kg, SpO2 96%. Last Weight  Most recent update: 07/09/2023  3:13 AM    Weight  85.6 kg (188 lb 11.4 oz)             CONSTITUTIONAL: Well developed, and nourished, responsive, hard of hearing, and unable to complete answers to questions, arousable without distress, but clearly uncomfortable.   EYES: Sclera non-icteric.   EARS, NOSE, MOUTH AND THROAT:  The oropharynx is clear. Oral mucosa is pink and moist.    Hearing is diminished, but responds to voice.   NECK: Trachea is midline, and there is no jugular venous distension.  LYMPH NODES:  Lymph nodes in the neck are not enlarged. RESPIRATORY:  Lungs are clear anteriorly.   Normal respiratory effort without pathologic use of accessory muscles. CARDIOVASCULAR: Heart tachycardic.  Well perfused.  GI: The abdomen is diffusely tender with guarding, peritonitic, seemingly distended.   MUSCULOSKELETAL:  Warm with edema.  SKIN: Skin turgor is normal.  NEUROLOGIC: With soft restraints, request to have them removed. PSYCH:  Alert and oriented to person. Affect is somber, appropriate for situation.  Data Reviewed I have personally reviewed what is currently available of the patient's imaging, recent labs and medical records.    Labs:     Latest Ref Rng & Units 07/02/2023    2:16 AM 07/20/2023    7:08 PM 07/20/2023    4:11 AM  CBC  WBC 4.0 - 10.5 K/uL 14.4  14.5  13.5   Hemoglobin 12.0 - 15.0 g/dL 65.7  84.6  96.2   Hematocrit 36.0 - 46.0 % 38.3  39.3  46.6   Platelets 150 - 400 K/uL 289  321  337       Latest Ref Rng & Units 07/20/2023    9:48 PM 07/20/2023    7:08 PM 07/20/2023    4:11 AM  CMP  Glucose 70 - 99 mg/dL  952  841   BUN 8 - 23 mg/dL  51  33   Creatinine 3.24 - 1.00 mg/dL  4.01  0.27   Sodium 253 - 145 mmol/L  133  138   Potassium 3.5 - 5.1 mmol/L  3.2  3.4  3.5   Chloride 98 - 111 mmol/L  104  105   CO2 22 - 32 mmol/L  17  20   Calcium 8.9 - 10.3 mg/dL  8.6  9.3   Total Protein 6.5 - 8.1 g/dL  5.3    Total Bilirubin 0.3 - 1.2 mg/dL  0.4    Alkaline Phos 38 - 126 U/L  41    AST 15 - 41 U/L  85    ALT 0 - 44 U/L  32       Imaging studies:   Last 24 hrs: CT Angio Abd/Pel w/ and/or w/o  Result Date: 06/22/2023 CLINICAL DATA:  Nausea and vomiting and diarrhea. Concern for mesenteric ischemia. Status post mesenteric artery stenting and thrombectomy of the SMA stent. EXAM: CTA ABDOMEN AND PELVIS WITHOUT AND WITH CONTRAST TECHNIQUE: Multidetector CT imaging of the abdomen and  pelvis was performed using the standard protocol during bolus administration of intravenous contrast. Multiplanar reconstructed images and MIPs were obtained and reviewed to evaluate the vascular anatomy. RADIATION DOSE REDUCTION: This exam was performed according to the departmental dose-optimization program which includes automated exposure control, adjustment of the mA and/or kV according to patient size and/or use of iterative reconstruction technique. CONTRAST:  75mL OMNIPAQUE IOHEXOL 350 MG/ML SOLN COMPARISON:  CT of the abdomen pelvis dated 07/12/2023. FINDINGS: VASCULAR Aorta: Advanced calcified and noncalcified plaque of the abdominal aorta. No aneurysmal dilatation or dissection. No periaortic fluid collection. Celiac: There is atherosclerotic calcification of the origin of the celiac artery with approximately 50% luminal narrowing. The celiac artery and its major branches appear small and somewhat diminutive but remain patent. SMA: There is a stent in the proximal SMA. The stent appears patent on today's study. This was occluded on the prior CT of 07/18/2023. Renals: There is atherosclerotic calcification of the origins of the renal arteries, left greater right. The renal arteries appear somewhat narrow and diminutive but remain patent. Small accessory right renal artery supplying the inferior pole of the right kidney. IMA: The IMA is patent. Inflow: Mild atherosclerotic calcification of the iliac arteries. The iliac arteries remain patent. No aneurysmal dilatation or dissection. Proximal Outflow: The visualized proximal outflow is patent Veins: No obvious venous abnormality within the limitations of this arterial phase study. No portal venous gas. Review of the MIP images confirms the above findings. NON-VASCULAR Lower chest: Bibasilar subpleural atelectasis/scarring. There is coronary vascular calcification. No intra-abdominal free air. Small free fluid in the lower abdomen and pelvis. Hepatobiliary:  The liver is unremarkable. No biliary ductal dilatation. Cholecystectomy. No retained calcified stone noted in the central CBD. Pancreas: No active inflammatory changes. Spleen: Normal in size without focal abnormality. Adrenals/Urinary Tract: The adrenal glands are unremarkable. There is no hydronephrosis on either side. The visualized ureters and urinary bladder appear unremarkable. Stomach/Bowel: There is inflammatory changes of several loops of small bowel in the mid to lower abdomen. There is associated wall thickening and pneumatosis of these bowel loops suggestive of bowel ischemia. Mild dilatation of these bowel loops measure up to 3.7 cm consistent with reactive ileus. No definite evidence of mechanical obstruction. The appendix is not visualized with certainty. No inflammatory changes identified in the right lower quadrant. Lymphatic: No adenopathy. Reproductive: Hysterectomy.  No adnexal masses. Other: Compression device over the right groin. Musculoskeletal: Osteopenia with degenerative changes of the spine and hips. No acute osseous pathology. IMPRESSION: 1. Interval patency of the SMA stent in keeping with thrombectomy. 2. Inflammatory changes of several  loops of small bowel in the mid to lower abdomen with associated wall thickening and pneumatosis suggestive of bowel ischemia. Findings may be embolic in etiology. 3.  Aortic Atherosclerosis (ICD10-I70.0). These results were called by telephone at the time of interpretation on 07/07/2023 at 1:55 am to nurse practitioner BRITTON RUST-CHESTER , who verbally acknowledged these results. Electronically Signed   By: Elgie Collard M.D.   On: 07/07/2023 02:12   DG Abd 1 View  Result Date: 07/20/2023 CLINICAL DATA:  Lethargic suspected GI bleed EXAM: ABDOMEN - 1 VIEW COMPARISON:  CT 06/25/2023 FINDINGS: Moderate air distension of the stomach. Moderate air distension of the bowel. Contrast in the urinary bladder. Possible intramural air within the left  pelvis. IMPRESSION: Moderate air distension of the stomach and bowel, possible ileus. Appearance suggesting possible intramural air within the left pelvic bowel loop, CT suggested for further assessment. These results will be called to the ordering clinician or representative by the Radiologist Assistant, and communication documented in the PACS or [employer]. Electronically Signed   By: Jasmine Pang M.D.   On: 07/20/2023 19:17     Assessment/Plan:  77 y.o. female with likely sepsis and septic shock secondary to probable ischemic small bowel, complicated by pertinent comorbidities including.  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   Bell's palsy 11/18/2022   At risk for polypharmacy 06/11/2022   Unresponsiveness 03/27/2020   Acute metabolic encephalopathy 03/27/2020   Unresponsive 03/27/2020   Acute hypercapnic respiratory failure (HCC) 03/27/2020   Change in mental status 02/08/2020   Pressure injury of skin 10/23/2019   Fall    Severe hypertension 10/20/2019   Diabetic ulcer of left heel associated with type 2 diabetes mellitus, limited to breakdown of skin (HCC) 09/11/2019   Right foot infection 12/19/2018   Acute encephalopathy 07/06/2018   Anxiety 06/10/2017   Chest pain with moderate risk for cardiac etiology 06/09/2017   UTI (urinary tract infection) 05/19/2017   CAD S/P percutaneous coronary angioplasty 05/19/2017   Chronic narcotic use 05/19/2017   Cellulitis in diabetic foot (HCC) 05/19/2017   Opioid overdose (HCC) 02/23/2017   Altered mental status 02/23/2017   Acute colitis  01/19/2017   Sepsis (HCC) 01/17/2017   Cellulitis of fourth toe of right foot 04/10/2016   Somnolence 04/09/2016   Cellulitis 04/03/2016   Hematoma of right parietal scalp 02/23/2016   Multiple falls 02/23/2016   Physical debility 02/23/2016   Type 2 diabetes mellitus (HCC) 02/23/2016   Syncope and collapse 02/23/2016   Polypharmacy 02/23/2016   Osteomyelitis of toe (HCC) 01/23/2016   Rhabdomyolysis 12/07/2015   ARF (acute renal failure) (HCC) 11/29/2015   Hypotension 11/29/2015   Chronic ischemic heart disease 11/27/2015   Diabetic osteomyelitis (HCC) 11/07/2015   Angina effort 11/07/2015   Osteomyelitis (HCC) 11/07/2015   Anemia due to chronic blood loss 06/14/2015   Generalized anxiety disorder 06/14/2015   Lumbago with sciatica 06/14/2015   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   S/P cardiac catheterization 06/04/2015   Old myocardial infarction 12/07/2014   Degeneration of intervertebral disc at C4-C5 level 07/19/2014   Abnormal weight loss 05/18/2014  Chronic pain syndrome 05/18/2014   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  I am not sure she is able to adequately make that decision.  Initially considered this would be an emergent procedure regardless of consent, however in review with Dr. Ronni Rumble, we feel her operative risk is excessive, and likely outcome to be severely morbid at best, and most likely mortal.  Her anesthetic risk is excessive, and the balancing act of anticoagulation and at least 2 explorations are likely to lead to her demise.  We reached out to ethics regarding the futility of further intervention.  I have  discussed with the patient at length, and considering her potential mortality, and her inability to communicate with anyone once intubated, her hearts desire is to speak with an estranged daughter Jessica Donovan, formerly [employer] [place]  -At present we have resumed heparin, deferring surgery, expectant that an operation is unlikely to significantly improve her potential for meaningful outcome.  And very likely be unsurvivable.  -I appreciate the ICU team making every effort to contact significant and meaningful others, and primarily with the efforts to reach the most important person to Ms. Micklos is her daughter Jessica Donovan.  I remain readily available, as for now we will standby.    Thank you for the opportunity to participate in this patient's care.   -- Campbell Lerner, M.D., FACS 07/08/2023, 5:08 AM

## 2023-07-22 NOTE — Progress Notes (Signed)
*  PRELIMINARY RESULTS* Echocardiogram 2D Echocardiogram has been performed.  Cristela Blue 06/25/2023, 8:50 AM

## 2023-07-22 NOTE — Progress Notes (Addendum)
Progress Note Again attempted to follow up after initial consult this morning. Patient still not reliable and intermittently participates in interview. She verbalizes to me that she "understands what is going on." She still wishes to "hear from her daughter" prior to making any decisions. I again explained to the best of my ability that without surgery this may likely be a terminal insult. Surgery itself will also carry significant risks.   She is currently tachycardic to 130 bpms, hypotensive to 87/63 on 150 mcg phenylephrine, also on Amiodarone. Abdominal examination is still concerning for peritonitis consistent with possible ischemic bowel noted on imaging overnight.   Patient still not willing to consent to surgical intervention; she does verbalize understanding of the situation but is intermittently lucid. Unsure how reliable this is. Seems attempts at contacting her daughter are futile. I did discuss with RN staff who state they were able to get in contact with someone (unsure family or friend) listed in demographics and they are making efforts to come to the hospital.   We will remain readily available to aid in discussions / decision making She is DNR/DNI  Please call or message if situation or needs change  -- Lynden Oxford, PA-C [facility] 07/01/2023, 8:52 AM M-F: 7am - 4pm

## 2023-07-22 NOTE — Progress Notes (Addendum)
Atrial Fibrillation with Rapid Ventricular Response Patient initially in a ventricular tachycardia, after amiodarone bolus converted into Atrial fibrillation with RVR and frequent runs of wide QRS V-tach - Amiodarone infusion & additional Amio bolus administered - continue to replace electrolytes, f/u BMP, Mg & Phos after current replacement - continue Heparin drip per pharmacy consult - f/u TSH & thyroid panel - Echocardiogram ordered - Consider Cardiology consult depending on work up above - Continuous cardiac monitoring   Suspected Small Bowel Ischemia CT angio abdomen/pelvis w/wo contrast 06/23/2023: Interval patency of the SMA stent in keeping with thrombectomy. Inflammatory changes of several loops of small bowel in the mid to lower abdomen with associated wall thickening and pneumatosis suggestive of bowel ischemia. Findings may be embolic in etiology.  Aortic Atherosclerosis  - discussed imaging with Dr. Claudine Mouton Attempted to discuss imaging with the patient who has become increasingly delirious throughout the evening without focal deficits. Patient became agitated, stating "we messed up". Attempted to discuss central line placement but patient adamant that she does not want central line placement. Asked if she has family available, she confirmed she has a daughter but that no one knows the daughter's phone number and that Tobi Bastos, her friend, is the best contact person. In searching through the chart it appears Alycia Rossetti is possibly her POA. Obtained correct phone number from the patient's cell phone- but no answer, left a voicemail. Dr. Claudine Mouton with plans to take the patient urgently to the OR for ex-lap due to suspected small bowel ischemia. - heparin stopped   Cheryll Cockayne Rust-Chester, AGACNP-BC Acute Care Nurse Practitioner [facility]   [phone] / [phone] Please see Amion for details.

## 2023-07-22 NOTE — Evaluation (Deleted)
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 Hospital course significant for angiogram and thrombectomy to SMA via R femoral artery (07/02/2023)  Clinical Impression  Patient resting in bed upon arrival to room; alert and oriented to basic information, visibly anxious related to pain and general medical condition.  Endorses "trouble hearing and understanding"; concerns relayed to primary RN who verified patient had been assessed by MD this AM.  No additional testing ordered; cleared for participation with session.  Patient rates abdominal pain, 8-9/10; pain meds and anxiety meds received prior to session   Bilat UE/LE strength and ROM grossly symmetrical and WFL; no focal weakness appreciated. Generally guarded in all movement due to abdominal pain.  Currently requiring min/mod assist for bed mobility; min/mod assist +2 for sit/stand, standing balance and bed/chair transfer with RW.  Limited tolerance for full postural extension; generally short, shuffling steps with transfer.  Additional gait deferred due to pain/anxiety; will continue to assess/progress in subsequent sessions as appropriate. Would benefit from skilled PT to address above deficits and promote optimal return to PLOF.; recommend post-acute PT follow up as indicated by interdisciplinary care team.          If plan is discharge home, recommend the following: A little help with walking and/or transfers;A little help with bathing/dressing/bathroom   Can travel by private vehicle   No    Equipment Recommendations    Recommendations for Other Services       Functional Status Assessment Patient has had a recent decline in their functional status and demonstrates the ability to make  significant improvements in function in a reasonable and predictable amount of time.     Precautions / Restrictions Precautions Precautions: Fall Restrictions Weight Bearing Restrictions: No      Mobility  Bed Mobility Overal bed mobility: Needs Assistance Bed Mobility: Supine to Sit     Supine to sit: Min assist, Mod assist          Transfers Overall transfer level: Needs assistance Equipment used: Rolling walker (2 wheels) Transfers: Sit to/from Stand, Bed to chair/wheelchair/BSC Sit to Stand: Min assist, Mod assist, +2 safety/equipment Stand pivot transfers: Mod assist, Min assist, +2 safety/equipment         General transfer comment: assist for lift off and standing balance; limited tolerance for upright due to abdominal pain    Ambulation/Gait               General Gait Details: deferred due to pain  Stairs            Wheelchair Mobility     Tilt Bed    Modified Rankin (Stroke Patients Only)       Balance Overall balance assessment: Needs assistance Sitting-balance support: No upper extremity supported, Feet supported Sitting balance-Leahy Scale: Good     Standing balance support: Bilateral upper extremity supported Standing balance-Leahy Scale: Fair                               Pertinent Vitals/Pain Pain Assessment Faces Pain Scale: Hurts whole lot Pain Location: abdomen Pain Descriptors / Indicators: Aching, Grimacing, Guarding    Home Living Family/patient expects to be discharged to:: Private residence Living Arrangements: Alone Available Help at Discharge: Available PRN/intermittently;Friend(s) Type of Home:  Apartment         Home Layout: One level Home Equipment: Rollator (4 wheels);BSC/3in1;Tub bench      Prior Function Prior Level of Function : Independent/Modified Independent;Needs assist             Mobility Comments: Amb without assist device in the home, rollator in the community; no home  O2 ADLs Comments: MOD I in ADL, assist from aid with IADLs (driving, cooking, cleaning, grocery shopping)     Hand Dominance   Dominant Hand: Right    Extremity/Trunk Assessment                Communication   Communication: HOH  Cognition Arousal/Alertness: Awake/alert Behavior During Therapy: WFL for tasks assessed/performed, Anxious Overall Cognitive Status: Within Functional Limits for tasks assessed                                          General Comments      Exercises Other Exercises Other Exercises: Patient appears to be quite anxious throughout evaluation-educated in pursed lip breathing, relaxation and repositioning techniques to help calm.  Patient voiced and demonstrated understanding, but unable to consistently utilize strategies.  Provided with frequent encouragement and reassurance throughout session   Assessment/Plan    PT Assessment Patient needs continued PT services  PT Problem List Decreased strength;Decreased balance;Decreased activity tolerance;Decreased mobility;Decreased coordination;Decreased cognition;Decreased knowledge of use of DME;Decreased safety awareness;Decreased knowledge of precautions;Cardiopulmonary status limiting activity;Obesity       PT Treatment Interventions DME instruction;Gait training;Therapeutic activities;Functional mobility training;Stair training;Therapeutic exercise;Balance training;Patient/family education;Cognitive remediation    PT Goals (Current goals can be found in the Care Plan section)  Acute Rehab PT Goals Patient Stated Goal: to make the pain better PT Goal Formulation: With patient Time For Goal Achievement: 08/03/23 Potential to Achieve Goals: Good    Frequency Min 1X/week     Co-evaluation               AM-PAC PT "6 Clicks" Mobility  Outcome Measure Help needed turning from your back to your side while in a flat bed without using bedrails?: A Little Help needed moving from  lying on your back to sitting on the side of a flat bed without using bedrails?: A Lot Help needed moving to and from a bed to a chair (including a wheelchair)?: A Lot Help needed standing up from a chair using your arms (e.g., wheelchair or bedside chair)?: A Lot Help needed to walk in hospital room?: A Lot Help needed climbing 3-5 steps with a railing? : A Lot 6 Click Score: 13    End of Session Equipment Utilized During Treatment: Gait belt Activity Tolerance: Patient tolerated treatment well;Patient limited by pain Patient left: in chair;with call bell/phone within reach;with chair alarm set Nurse Communication: Mobility status PT Visit Diagnosis: Muscle weakness (generalized) (M62.81);Difficulty in walking, not elsewhere classified (R26.2)    Time:1041  -1111      Charges:               Tommy Rainwater. Manson Passey, PT, DPT, NCS 07/12/2023, 8:43 AM [phone]

## 2023-07-22 DEATH — deceased

## 2023-08-10 ENCOUNTER — Ambulatory Visit (INDEPENDENT_AMBULATORY_CARE_PROVIDER_SITE_OTHER): Payer: Medicare HMO | Admitting: Vascular Surgery

## 2023-08-10 ENCOUNTER — Encounter (INDEPENDENT_AMBULATORY_CARE_PROVIDER_SITE_OTHER): Payer: Medicare HMO
